# Patient Record
Sex: Female | Born: 1945
Health system: Southern US, Community
[De-identification: ages and names within clinical notes are randomized; demographics above are authoritative.]

## PROBLEM LIST (undated history)

## (undated) DIAGNOSIS — I10 Essential (primary) hypertension: Secondary | ICD-10-CM

## (undated) DIAGNOSIS — J449 Chronic obstructive pulmonary disease, unspecified: Secondary | ICD-10-CM

## (undated) DIAGNOSIS — M1990 Unspecified osteoarthritis, unspecified site: Secondary | ICD-10-CM

## (undated) DIAGNOSIS — E119 Type 2 diabetes mellitus without complications: Secondary | ICD-10-CM

## (undated) DIAGNOSIS — E785 Hyperlipidemia, unspecified: Secondary | ICD-10-CM

## (undated) DIAGNOSIS — Z8619 Personal history of other infectious and parasitic diseases: Secondary | ICD-10-CM

## (undated) DIAGNOSIS — C50919 Malignant neoplasm of unspecified site of unspecified female breast: Secondary | ICD-10-CM

## (undated) DIAGNOSIS — J45909 Unspecified asthma, uncomplicated: Secondary | ICD-10-CM

## (undated) DIAGNOSIS — I700 Atherosclerosis of aorta: Secondary | ICD-10-CM

## (undated) DIAGNOSIS — N200 Calculus of kidney: Secondary | ICD-10-CM

## (undated) DIAGNOSIS — F17200 Nicotine dependence, unspecified, uncomplicated: Secondary | ICD-10-CM

## (undated) DIAGNOSIS — I2510 Atherosclerotic heart disease of native coronary artery without angina pectoris: Secondary | ICD-10-CM

## (undated) DIAGNOSIS — C50912 Malignant neoplasm of unspecified site of left female breast: Secondary | ICD-10-CM

## (undated) HISTORY — DX: Chronic obstructive pulmonary disease, unspecified: J44.9

## (undated) HISTORY — DX: Calculus of kidney: N20.0

## (undated) HISTORY — PX: TUBAL LIGATION: SHX77

## (undated) HISTORY — PX: BREAST SURGERY: SHX581

## (undated) HISTORY — DX: Essential (primary) hypertension: I10

## (undated) HISTORY — DX: Hyperlipidemia, unspecified: E78.5

## (undated) HISTORY — DX: Nicotine dependence, unspecified, uncomplicated: F17.200

## (undated) HISTORY — PX: DILATION AND CURETTAGE OF UTERUS: SHX78

## (undated) HISTORY — DX: Unspecified osteoarthritis, unspecified site: M19.90

## (undated) HISTORY — PX: ABDOMINAL HYSTERECTOMY: SHX81

## (undated) HISTORY — DX: Type 2 diabetes mellitus without complications: E11.9

## (undated) HISTORY — PX: COLECTOMY: SHX59

## (undated) HISTORY — DX: Malignant neoplasm of unspecified site of left female breast: C50.912

## (undated) HISTORY — DX: Malignant neoplasm of unspecified site of unspecified female breast: C50.919

## (undated) HISTORY — PX: OTHER SURGICAL HISTORY: SHX169

---

## 2000-08-21 ENCOUNTER — Encounter: Payer: Self-pay | Admitting: Family Medicine

## 2000-08-21 ENCOUNTER — Ambulatory Visit (HOSPITAL_COMMUNITY): Admission: RE | Admit: 2000-08-21 | Discharge: 2000-08-21 | Payer: Self-pay | Admitting: Family Medicine

## 2000-09-12 ENCOUNTER — Encounter: Payer: Self-pay | Admitting: Family Medicine

## 2000-09-12 ENCOUNTER — Ambulatory Visit (HOSPITAL_COMMUNITY): Admission: RE | Admit: 2000-09-12 | Discharge: 2000-09-12 | Payer: Self-pay | Admitting: Family Medicine

## 2002-01-09 ENCOUNTER — Ambulatory Visit (HOSPITAL_COMMUNITY): Admission: RE | Admit: 2002-01-09 | Discharge: 2002-01-09 | Payer: Self-pay | Admitting: Family Medicine

## 2002-01-09 ENCOUNTER — Encounter: Payer: Self-pay | Admitting: Family Medicine

## 2002-03-01 ENCOUNTER — Encounter: Payer: Self-pay | Admitting: Emergency Medicine

## 2002-03-01 ENCOUNTER — Emergency Department (HOSPITAL_COMMUNITY): Admission: EM | Admit: 2002-03-01 | Discharge: 2002-03-01 | Payer: Self-pay | Admitting: Emergency Medicine

## 2003-10-02 ENCOUNTER — Inpatient Hospital Stay (HOSPITAL_COMMUNITY): Admission: EM | Admit: 2003-10-02 | Discharge: 2003-10-13 | Payer: Self-pay

## 2004-03-28 ENCOUNTER — Emergency Department (HOSPITAL_COMMUNITY): Admission: EM | Admit: 2004-03-28 | Discharge: 2004-03-28 | Payer: Self-pay | Admitting: Emergency Medicine

## 2004-08-08 ENCOUNTER — Ambulatory Visit (HOSPITAL_COMMUNITY): Admission: RE | Admit: 2004-08-08 | Discharge: 2004-08-08 | Payer: Self-pay | Admitting: Family Medicine

## 2004-08-23 ENCOUNTER — Ambulatory Visit (HOSPITAL_COMMUNITY): Admission: RE | Admit: 2004-08-23 | Discharge: 2004-08-23 | Payer: Self-pay | Admitting: Family Medicine

## 2004-09-12 ENCOUNTER — Ambulatory Visit (HOSPITAL_COMMUNITY): Admission: RE | Admit: 2004-09-12 | Discharge: 2004-09-12 | Payer: Self-pay | Admitting: General Surgery

## 2004-09-12 ENCOUNTER — Inpatient Hospital Stay (HOSPITAL_COMMUNITY): Admission: AD | Admit: 2004-09-12 | Discharge: 2004-09-21 | Payer: Self-pay | Admitting: General Surgery

## 2004-09-21 ENCOUNTER — Inpatient Hospital Stay (HOSPITAL_COMMUNITY): Admission: AD | Admit: 2004-09-21 | Discharge: 2004-09-25 | Payer: Self-pay | Admitting: General Surgery

## 2004-10-25 ENCOUNTER — Ambulatory Visit (HOSPITAL_COMMUNITY): Admission: RE | Admit: 2004-10-25 | Discharge: 2004-10-25 | Payer: Self-pay | Admitting: General Surgery

## 2004-10-25 ENCOUNTER — Encounter (INDEPENDENT_AMBULATORY_CARE_PROVIDER_SITE_OTHER): Payer: Self-pay | Admitting: Diagnostic Radiology

## 2004-11-15 ENCOUNTER — Encounter (INDEPENDENT_AMBULATORY_CARE_PROVIDER_SITE_OTHER): Payer: Self-pay | Admitting: General Surgery

## 2004-11-15 ENCOUNTER — Ambulatory Visit (HOSPITAL_COMMUNITY): Admission: RE | Admit: 2004-11-15 | Discharge: 2004-11-15 | Payer: Self-pay | Admitting: General Surgery

## 2004-12-11 ENCOUNTER — Ambulatory Visit (HOSPITAL_COMMUNITY): Payer: Self-pay | Admitting: Oncology

## 2004-12-11 ENCOUNTER — Encounter (HOSPITAL_COMMUNITY): Admission: RE | Admit: 2004-12-11 | Discharge: 2005-01-10 | Payer: Self-pay | Admitting: Oncology

## 2004-12-11 ENCOUNTER — Encounter: Admission: RE | Admit: 2004-12-11 | Discharge: 2004-12-11 | Payer: Self-pay | Admitting: Oncology

## 2004-12-14 ENCOUNTER — Encounter: Payer: Self-pay | Admitting: Family Medicine

## 2004-12-14 ENCOUNTER — Ambulatory Visit (HOSPITAL_COMMUNITY): Admission: RE | Admit: 2004-12-14 | Discharge: 2004-12-14 | Payer: Self-pay | Admitting: Oncology

## 2004-12-15 ENCOUNTER — Ambulatory Visit: Admission: RE | Admit: 2004-12-15 | Discharge: 2005-02-19 | Payer: Self-pay | Admitting: Radiation Oncology

## 2005-01-15 DIAGNOSIS — C50912 Malignant neoplasm of unspecified site of left female breast: Secondary | ICD-10-CM

## 2005-01-15 DIAGNOSIS — C50919 Malignant neoplasm of unspecified site of unspecified female breast: Secondary | ICD-10-CM

## 2005-01-15 HISTORY — DX: Malignant neoplasm of unspecified site of unspecified female breast: C50.919

## 2005-01-15 HISTORY — DX: Malignant neoplasm of unspecified site of left female breast: C50.912

## 2005-03-08 ENCOUNTER — Emergency Department (HOSPITAL_COMMUNITY): Admission: EM | Admit: 2005-03-08 | Discharge: 2005-03-08 | Payer: Self-pay | Admitting: Emergency Medicine

## 2005-03-13 ENCOUNTER — Emergency Department (HOSPITAL_COMMUNITY): Admission: EM | Admit: 2005-03-13 | Discharge: 2005-03-13 | Payer: Self-pay | Admitting: Emergency Medicine

## 2005-05-14 ENCOUNTER — Emergency Department (HOSPITAL_COMMUNITY): Admission: EM | Admit: 2005-05-14 | Discharge: 2005-05-14 | Payer: Self-pay | Admitting: Emergency Medicine

## 2005-05-27 ENCOUNTER — Emergency Department (HOSPITAL_COMMUNITY): Admission: EM | Admit: 2005-05-27 | Discharge: 2005-05-27 | Payer: Self-pay | Admitting: Emergency Medicine

## 2005-06-21 ENCOUNTER — Encounter (HOSPITAL_COMMUNITY): Admission: RE | Admit: 2005-06-21 | Discharge: 2005-07-21 | Payer: Self-pay | Admitting: Oncology

## 2005-06-21 ENCOUNTER — Encounter: Admission: RE | Admit: 2005-06-21 | Discharge: 2005-06-21 | Payer: Self-pay | Admitting: Oncology

## 2005-06-21 ENCOUNTER — Ambulatory Visit (HOSPITAL_COMMUNITY): Payer: Self-pay | Admitting: Oncology

## 2005-10-03 ENCOUNTER — Encounter (HOSPITAL_COMMUNITY): Admission: RE | Admit: 2005-10-03 | Discharge: 2005-10-12 | Payer: Self-pay | Admitting: Oncology

## 2005-10-03 ENCOUNTER — Encounter: Admission: RE | Admit: 2005-10-03 | Discharge: 2005-10-12 | Payer: Self-pay | Admitting: Oncology

## 2005-12-04 ENCOUNTER — Ambulatory Visit (HOSPITAL_COMMUNITY): Admission: RE | Admit: 2005-12-04 | Discharge: 2005-12-04 | Payer: Self-pay | Admitting: Internal Medicine

## 2005-12-24 ENCOUNTER — Encounter (HOSPITAL_COMMUNITY): Admission: RE | Admit: 2005-12-24 | Discharge: 2006-01-14 | Payer: Self-pay | Admitting: Oncology

## 2005-12-24 ENCOUNTER — Ambulatory Visit (HOSPITAL_COMMUNITY): Payer: Self-pay | Admitting: Oncology

## 2006-01-03 ENCOUNTER — Ambulatory Visit (HOSPITAL_COMMUNITY): Admission: RE | Admit: 2006-01-03 | Discharge: 2006-01-03 | Payer: Self-pay | Admitting: General Surgery

## 2006-04-12 ENCOUNTER — Emergency Department (HOSPITAL_COMMUNITY): Admission: EM | Admit: 2006-04-12 | Discharge: 2006-04-12 | Payer: Self-pay | Admitting: Emergency Medicine

## 2006-06-24 ENCOUNTER — Ambulatory Visit (HOSPITAL_COMMUNITY): Payer: Self-pay | Admitting: Oncology

## 2006-06-24 ENCOUNTER — Encounter (HOSPITAL_COMMUNITY): Admission: RE | Admit: 2006-06-24 | Discharge: 2006-07-24 | Payer: Self-pay | Admitting: Oncology

## 2006-10-09 ENCOUNTER — Ambulatory Visit (HOSPITAL_COMMUNITY): Admission: RE | Admit: 2006-10-09 | Discharge: 2006-10-09 | Payer: Self-pay | Admitting: Internal Medicine

## 2006-11-27 ENCOUNTER — Emergency Department (HOSPITAL_COMMUNITY): Admission: EM | Admit: 2006-11-27 | Discharge: 2006-11-27 | Payer: Self-pay | Admitting: Emergency Medicine

## 2007-01-28 ENCOUNTER — Ambulatory Visit (HOSPITAL_COMMUNITY): Payer: Self-pay | Admitting: Oncology

## 2007-01-28 ENCOUNTER — Encounter (HOSPITAL_COMMUNITY): Admission: RE | Admit: 2007-01-28 | Discharge: 2007-02-27 | Payer: Self-pay | Admitting: Oncology

## 2007-03-01 ENCOUNTER — Emergency Department (HOSPITAL_COMMUNITY): Admission: EM | Admit: 2007-03-01 | Discharge: 2007-03-01 | Payer: Self-pay | Admitting: Emergency Medicine

## 2007-05-14 ENCOUNTER — Emergency Department (HOSPITAL_COMMUNITY): Admission: EM | Admit: 2007-05-14 | Discharge: 2007-05-14 | Payer: Self-pay | Admitting: Emergency Medicine

## 2007-07-01 ENCOUNTER — Ambulatory Visit (HOSPITAL_COMMUNITY): Admission: RE | Admit: 2007-07-01 | Discharge: 2007-07-01 | Payer: Self-pay | Admitting: Family Medicine

## 2007-07-16 ENCOUNTER — Ambulatory Visit (HOSPITAL_COMMUNITY): Admission: RE | Admit: 2007-07-16 | Discharge: 2007-07-16 | Payer: Self-pay | Admitting: Orthopaedic Surgery

## 2007-07-29 ENCOUNTER — Encounter (HOSPITAL_COMMUNITY): Admission: RE | Admit: 2007-07-29 | Discharge: 2007-08-28 | Payer: Self-pay | Admitting: Oncology

## 2007-07-29 ENCOUNTER — Ambulatory Visit (HOSPITAL_COMMUNITY): Payer: Self-pay | Admitting: Oncology

## 2007-11-03 ENCOUNTER — Ambulatory Visit (HOSPITAL_COMMUNITY): Admission: RE | Admit: 2007-11-03 | Discharge: 2007-11-03 | Payer: Self-pay | Admitting: Internal Medicine

## 2007-12-21 ENCOUNTER — Emergency Department (HOSPITAL_COMMUNITY): Admission: EM | Admit: 2007-12-21 | Discharge: 2007-12-21 | Payer: Self-pay | Admitting: Emergency Medicine

## 2007-12-21 ENCOUNTER — Encounter: Payer: Self-pay | Admitting: Family Medicine

## 2008-01-13 ENCOUNTER — Ambulatory Visit (HOSPITAL_COMMUNITY): Payer: Self-pay | Admitting: Oncology

## 2008-01-13 ENCOUNTER — Encounter (HOSPITAL_COMMUNITY): Admission: RE | Admit: 2008-01-13 | Discharge: 2008-02-12 | Payer: Self-pay | Admitting: Oncology

## 2008-02-02 ENCOUNTER — Emergency Department (HOSPITAL_COMMUNITY): Admission: EM | Admit: 2008-02-02 | Discharge: 2008-02-02 | Payer: Self-pay | Admitting: Emergency Medicine

## 2008-04-03 ENCOUNTER — Emergency Department (HOSPITAL_COMMUNITY): Admission: EM | Admit: 2008-04-03 | Discharge: 2008-04-03 | Payer: Self-pay | Admitting: Emergency Medicine

## 2008-07-03 ENCOUNTER — Emergency Department (HOSPITAL_COMMUNITY): Admission: EM | Admit: 2008-07-03 | Discharge: 2008-07-03 | Payer: Self-pay | Admitting: Emergency Medicine

## 2008-08-11 ENCOUNTER — Ambulatory Visit (HOSPITAL_COMMUNITY): Payer: Self-pay | Admitting: Oncology

## 2008-08-11 ENCOUNTER — Encounter: Payer: Self-pay | Admitting: Family Medicine

## 2008-08-11 ENCOUNTER — Encounter (HOSPITAL_COMMUNITY): Admission: RE | Admit: 2008-08-11 | Discharge: 2008-09-10 | Payer: Self-pay | Admitting: Oncology

## 2008-08-25 ENCOUNTER — Encounter: Payer: Self-pay | Admitting: Family Medicine

## 2008-08-25 ENCOUNTER — Emergency Department (HOSPITAL_COMMUNITY): Admission: EM | Admit: 2008-08-25 | Discharge: 2008-08-25 | Payer: Self-pay | Admitting: Emergency Medicine

## 2008-09-29 ENCOUNTER — Ambulatory Visit: Payer: Self-pay | Admitting: Family Medicine

## 2008-09-29 DIAGNOSIS — F4323 Adjustment disorder with mixed anxiety and depressed mood: Secondary | ICD-10-CM | POA: Insufficient documentation

## 2008-09-29 DIAGNOSIS — M159 Polyosteoarthritis, unspecified: Secondary | ICD-10-CM

## 2008-09-29 DIAGNOSIS — I10 Essential (primary) hypertension: Secondary | ICD-10-CM | POA: Insufficient documentation

## 2008-10-04 ENCOUNTER — Encounter: Payer: Self-pay | Admitting: Family Medicine

## 2008-10-06 ENCOUNTER — Telehealth: Payer: Self-pay | Admitting: Family Medicine

## 2008-10-10 DIAGNOSIS — F17200 Nicotine dependence, unspecified, uncomplicated: Secondary | ICD-10-CM | POA: Insufficient documentation

## 2008-10-26 ENCOUNTER — Ambulatory Visit: Payer: Self-pay | Admitting: Family Medicine

## 2008-10-26 DIAGNOSIS — R5381 Other malaise: Secondary | ICD-10-CM

## 2008-10-26 DIAGNOSIS — J320 Chronic maxillary sinusitis: Secondary | ICD-10-CM

## 2008-10-26 DIAGNOSIS — J440 Chronic obstructive pulmonary disease with acute lower respiratory infection: Secondary | ICD-10-CM

## 2008-10-26 DIAGNOSIS — R5383 Other fatigue: Secondary | ICD-10-CM

## 2008-10-26 LAB — CONVERTED CEMR LAB
Blood in Urine, dipstick: NEGATIVE
Glucose, Urine, Semiquant: NEGATIVE
Ketones, urine, test strip: NEGATIVE
Nitrite: NEGATIVE
Urobilinogen, UA: 0.2

## 2008-10-27 ENCOUNTER — Telehealth: Payer: Self-pay | Admitting: Family Medicine

## 2008-10-28 ENCOUNTER — Encounter (HOSPITAL_COMMUNITY): Admission: RE | Admit: 2008-10-28 | Discharge: 2008-11-27 | Payer: Self-pay | Admitting: Orthopaedic Surgery

## 2008-11-10 ENCOUNTER — Encounter: Payer: Self-pay | Admitting: Family Medicine

## 2008-11-10 ENCOUNTER — Ambulatory Visit (HOSPITAL_COMMUNITY): Admission: RE | Admit: 2008-11-10 | Discharge: 2008-11-10 | Payer: Self-pay | Admitting: Family Medicine

## 2008-11-11 ENCOUNTER — Ambulatory Visit (HOSPITAL_COMMUNITY): Admission: RE | Admit: 2008-11-11 | Discharge: 2008-11-11 | Payer: Self-pay | Admitting: Family Medicine

## 2008-11-15 LAB — CONVERTED CEMR LAB
Bilirubin, Direct: 0.1 mg/dL (ref 0.0–0.3)
CO2: 26 meq/L (ref 19–32)
Calcium: 10.5 mg/dL (ref 8.4–10.5)
Cholesterol: 246 mg/dL — ABNORMAL HIGH (ref 0–200)
Glucose, Bld: 109 mg/dL — ABNORMAL HIGH (ref 70–99)
HCT: 42 % (ref 36.0–46.0)
HDL: 48 mg/dL (ref >39)
Indirect Bilirubin: 0.3 mg/dL (ref 0.0–0.9)
LDL Cholesterol: 148 mg/dL — ABNORMAL HIGH (ref 0–99)
RBC: 4.74 M/uL (ref 3.87–5.11)
RDW: 15 % (ref 11.5–15.5)
TSH: 1.022 microintl units/mL (ref 0.350–4.500)
Total Protein: 7 g/dL (ref 6.0–8.3)
WBC: 11.2 10*3/uL — ABNORMAL HIGH (ref 4.0–10.5)

## 2008-11-18 ENCOUNTER — Ambulatory Visit: Payer: Self-pay | Admitting: Family Medicine

## 2008-11-18 DIAGNOSIS — K3189 Other diseases of stomach and duodenum: Secondary | ICD-10-CM | POA: Insufficient documentation

## 2008-11-18 DIAGNOSIS — R1013 Epigastric pain: Secondary | ICD-10-CM

## 2008-12-13 ENCOUNTER — Telehealth: Payer: Self-pay | Admitting: Family Medicine

## 2008-12-31 ENCOUNTER — Telehealth: Payer: Self-pay | Admitting: Family Medicine

## 2009-01-04 ENCOUNTER — Telehealth: Payer: Self-pay | Admitting: Family Medicine

## 2009-02-11 ENCOUNTER — Encounter: Payer: Self-pay | Admitting: Family Medicine

## 2009-02-11 ENCOUNTER — Ambulatory Visit (HOSPITAL_COMMUNITY): Payer: Self-pay | Admitting: Oncology

## 2009-02-24 ENCOUNTER — Encounter: Payer: Self-pay | Admitting: Family Medicine

## 2009-02-24 LAB — CONVERTED CEMR LAB
ALT: 13 units/L (ref 0–35)
Alkaline Phosphatase: 86 units/L (ref 39–117)
BUN: 25 mg/dL — ABNORMAL HIGH (ref 6–23)
Bilirubin, Direct: 0.1 mg/dL (ref 0.0–0.3)
Calcium: 10 mg/dL (ref 8.4–10.5)
Cholesterol: 185 mg/dL (ref 0–200)
Creatinine, Ser: 0.77 mg/dL (ref 0.40–1.20)
HDL: 50 mg/dL (ref >39)
LDL Cholesterol: 88 mg/dL (ref 0–99)
Potassium: 4.2 meq/L (ref 3.5–5.3)
Total Bilirubin: 0.3 mg/dL (ref 0.3–1.2)
Total Protein: 7.1 g/dL (ref 6.0–8.3)
VLDL: 47 mg/dL — ABNORMAL HIGH (ref 0–40)

## 2009-03-02 ENCOUNTER — Ambulatory Visit: Payer: Self-pay | Admitting: Family Medicine

## 2009-03-02 ENCOUNTER — Telehealth: Payer: Self-pay | Admitting: Family Medicine

## 2009-03-02 ENCOUNTER — Other Ambulatory Visit: Admission: RE | Admit: 2009-03-02 | Discharge: 2009-03-02 | Payer: Self-pay | Admitting: Family Medicine

## 2009-03-02 LAB — CONVERTED CEMR LAB: Pap Smear: NEGATIVE

## 2009-03-08 ENCOUNTER — Telehealth: Payer: Self-pay | Admitting: Family Medicine

## 2009-03-09 DIAGNOSIS — K5909 Other constipation: Secondary | ICD-10-CM

## 2009-03-09 DIAGNOSIS — E785 Hyperlipidemia, unspecified: Secondary | ICD-10-CM

## 2009-03-24 ENCOUNTER — Telehealth: Payer: Self-pay | Admitting: Family Medicine

## 2009-04-26 ENCOUNTER — Telehealth: Payer: Self-pay | Admitting: Family Medicine

## 2009-05-02 ENCOUNTER — Ambulatory Visit: Payer: Self-pay | Admitting: Family Medicine

## 2009-05-02 DIAGNOSIS — R51 Headache: Secondary | ICD-10-CM

## 2009-05-02 DIAGNOSIS — R519 Headache, unspecified: Secondary | ICD-10-CM | POA: Insufficient documentation

## 2009-05-03 LAB — CONVERTED CEMR LAB
Basophils Absolute: 0.1 10*3/uL (ref 0.0–0.1)
Eosinophils Absolute: 0.4 10*3/uL (ref 0.0–0.7)
Eosinophils Relative: 3 % (ref 0–5)
Hemoglobin: 14.1 g/dL (ref 12.0–15.0)
Lymphocytes Relative: 32 % (ref 12–46)
MCV: 88.8 fL (ref 78.0–100.0)
RBC: 4.98 M/uL (ref 3.87–5.11)
WBC: 10.6 10*3/uL — ABNORMAL HIGH (ref 4.0–10.5)

## 2009-05-09 ENCOUNTER — Ambulatory Visit (HOSPITAL_COMMUNITY): Admission: RE | Admit: 2009-05-09 | Discharge: 2009-05-09 | Payer: Self-pay | Admitting: Family Medicine

## 2009-06-20 ENCOUNTER — Encounter: Payer: Self-pay | Admitting: Family Medicine

## 2009-06-20 ENCOUNTER — Telehealth: Payer: Self-pay | Admitting: Family Medicine

## 2009-06-28 ENCOUNTER — Ambulatory Visit: Payer: Self-pay | Admitting: Family Medicine

## 2009-06-29 ENCOUNTER — Telehealth: Payer: Self-pay | Admitting: Physician Assistant

## 2009-07-21 ENCOUNTER — Ambulatory Visit: Payer: Self-pay | Admitting: Family Medicine

## 2009-07-21 DIAGNOSIS — R7301 Impaired fasting glucose: Secondary | ICD-10-CM

## 2009-07-21 DIAGNOSIS — N3000 Acute cystitis without hematuria: Secondary | ICD-10-CM | POA: Insufficient documentation

## 2009-07-21 LAB — CONVERTED CEMR LAB
Blood in Urine, dipstick: NEGATIVE
Specific Gravity, Urine: 1.015
Urobilinogen, UA: 0.2
pH: 5

## 2009-07-22 ENCOUNTER — Encounter: Payer: Self-pay | Admitting: Family Medicine

## 2009-07-22 LAB — CONVERTED CEMR LAB
Albumin: 4.6 g/dL (ref 3.5–5.2)
Bilirubin, Direct: 0.1 mg/dL (ref 0.0–0.3)
Cholesterol: 165 mg/dL (ref 0–200)
HDL: 55 mg/dL (ref >39)
Hgb A1c MFr Bld: 6.1 % — ABNORMAL HIGH (ref <5.7)
Total Bilirubin: 0.3 mg/dL (ref 0.3–1.2)
Total CHOL/HDL Ratio: 3
Total Protein: 7.7 g/dL (ref 6.0–8.3)
VLDL: 25 mg/dL (ref 0–40)

## 2009-07-24 DIAGNOSIS — J449 Chronic obstructive pulmonary disease, unspecified: Secondary | ICD-10-CM

## 2009-07-24 DIAGNOSIS — J4489 Other specified chronic obstructive pulmonary disease: Secondary | ICD-10-CM | POA: Insufficient documentation

## 2009-08-27 ENCOUNTER — Emergency Department (HOSPITAL_COMMUNITY): Admission: EM | Admit: 2009-08-27 | Discharge: 2009-08-27 | Payer: Self-pay | Admitting: Emergency Medicine

## 2009-09-06 ENCOUNTER — Ambulatory Visit (HOSPITAL_COMMUNITY): Payer: Self-pay | Admitting: Oncology

## 2009-09-06 ENCOUNTER — Encounter (HOSPITAL_COMMUNITY): Admission: RE | Admit: 2009-09-06 | Discharge: 2009-10-06 | Payer: Self-pay | Admitting: Oncology

## 2009-09-06 ENCOUNTER — Encounter: Payer: Self-pay | Admitting: Family Medicine

## 2009-10-18 ENCOUNTER — Ambulatory Visit: Payer: Self-pay | Admitting: Family Medicine

## 2009-11-08 ENCOUNTER — Telehealth: Payer: Self-pay | Admitting: Family Medicine

## 2009-11-30 ENCOUNTER — Ambulatory Visit: Payer: Self-pay | Admitting: Family Medicine

## 2009-12-02 ENCOUNTER — Encounter: Payer: Self-pay | Admitting: Family Medicine

## 2009-12-14 ENCOUNTER — Ambulatory Visit (HOSPITAL_COMMUNITY)
Admission: RE | Admit: 2009-12-14 | Discharge: 2009-12-14 | Payer: Self-pay | Source: Home / Self Care | Admitting: Family Medicine

## 2009-12-19 ENCOUNTER — Telehealth (INDEPENDENT_AMBULATORY_CARE_PROVIDER_SITE_OTHER): Payer: Self-pay | Admitting: *Deleted

## 2010-01-23 ENCOUNTER — Telehealth: Payer: Self-pay | Admitting: Family Medicine

## 2010-01-25 ENCOUNTER — Emergency Department (HOSPITAL_COMMUNITY)
Admission: EM | Admit: 2010-01-25 | Discharge: 2010-01-25 | Payer: Self-pay | Source: Home / Self Care | Admitting: Emergency Medicine

## 2010-02-04 ENCOUNTER — Encounter: Payer: Self-pay | Admitting: General Surgery

## 2010-02-05 ENCOUNTER — Encounter: Payer: Self-pay | Admitting: Family Medicine

## 2010-02-08 ENCOUNTER — Telehealth: Payer: Self-pay | Admitting: Family Medicine

## 2010-02-13 ENCOUNTER — Ambulatory Visit
Admission: RE | Admit: 2010-02-13 | Discharge: 2010-02-13 | Payer: Self-pay | Source: Home / Self Care | Attending: Family Medicine | Admitting: Family Medicine

## 2010-02-14 NOTE — Progress Notes (Signed)
  Phone Note From Pharmacy   Caller: [facility]* Summary of Call: amitiza not covered by insurance lactulose and miralax ok Initial call taken by: Adella Hare LPN,  March 08, 2009 3:26 PM  Follow-up for Phone Call        advise and i will erx miralax daily instead Follow-up by: [name] MD,  March 09, 2009 5:52 AM  Additional Follow-up for Phone Call Additional follow up Details #1::        patient aware Additional Follow-up by: Adella Hare LPN,  March 09, 2009 9:29 AM

## 2010-02-14 NOTE — Assessment & Plan Note (Signed)
Summary: phy   Vital Signs:  Patient profile:   65 year old female Menstrual status:  hysterectomy Height:      64.5 inches Weight:      174.75 pounds BMI:     29.64 O2 Sat:      92 % Pulse rate:   102 / minute Pulse rhythm:   regular Resp:     16 per minute BP sitting:   140 / 84 Cuff size:   large  Vitals Entered By: Everitt Amber LPN (March 02, 2009 2:11 PM)  Nutrition Counseling: Patient's BMI is greater than 25 and therefore counseled on weight management options. CC: CPE  Is Patient Diabetic? No Pain Assessment Patient in pain? no        CC:  CPE .  History of Present Illness: Reports  that she has been doing well. Denies recent fever or chills.   Denies chest pain, palpitations, PND, orthopnea or leg swelling. Denies abdominal pain, nausea, vomitting, diarrhea , but has uncontrolled constipation, which is not responding to lactulose Denies change in bowel movements or bloody stool. Denies dysuria , frequency, incontinence or hesitancy. Denies  joint pain, swelling, or reduced mobility. Denies headaches, vertigo, seizures. Denies depression, anxiety or insomnia. Denies  rash, lesions, or itch. the pt still smokes and has no regular exercise routine     Preventive Screening-Counseling & Management  Alcohol-Tobacco     Smoking Cessation Counseling: yes  Current Medications (verified): 1)  Naproxen 500 Mg Tabs (Naproxen) .... Take 1 Tablet By Mouth Two Times A Day 2)  Hydrochlorothiazide 25 Mg Tabs (Hydrochlorothiazide) .... Take 1 Tablet By Mouth Once A Day 3)  Klor-Con M20 20 Meq Cr-Tabs (Potassium Chloride Crys Cr) .... Take 1 Tablet By Mouth Once A Day 4)  Femara 2.5 Mg Tabs (Letrozole) .... Take 1 Tablet By Mouth Once A Day 5)  Hydrocodone-Acetaminophen 7.5-325 Mg Tabs (Hydrocodone-Acetaminophen) .... Take One Tab Q 4 Hrs As Needed 6)  Flexeril 10 Mg Tabs (Cyclobenzaprine Hcl) .... Take 1 Tablet By Mouth Three Times A Day 7)  Loratadine 10 Mg Tabs  (Loratadine) .... Take 1 Tablet By Mouth Once A Day As Needed) 8)  Calcium + D 600-200 Mg-Unit Tabs (Calcium Carbonate-Vitamin D) .... 2 Tabs Once Daily 9)  Combivent 103-18 Mcg/act Aero (Ipratropium-Albuterol) .... 2 Puffs Twice Daily As Needed 10)  Simvastatin 20 Mg Tabs (Simvastatin) .... One Tab By Mouth Qhs 11)  Swimmers Ear Drops 95 % Liqd (Isopropyl Alcohol) .Marland Kitchen.. 1 Drop To Affected Ear Three Times Dailty As Needed 12)  Clotrimazole 1 % Crea (Clotrimazole) .... Apply Three Times Daily To Affected Area For 10 Days Then As Needed 13)  Lactulose 10 Gm/76ml Soln (Lactulose) .Marland Kitchen.. 1-2 Tsp Every 6-8 Hrs  Allergies (verified): 1)  ! Pcn 2)  ! Sulfa  Past History:  Past medical, surgical, family and social histories (including risk factors) reviewed for relevance to current acute and chronic problems.  Past Medical History: Reviewed history from 09/29/2008 and no changes required. Hypertension x early adulthood Early left breast cancer, removed in 2006 Osteoarthritis COPD h/o kidney stones nicotine dependence  Past Surgical History: Reviewed history from 09/29/2008 and no changes required. Left Breast surgery x 06 Hysterectomy x 1981 Tubal ligation x 1979 Colectomy 2005, secondary to acute diverticulitis with pelvic abcess  Family History: Reviewed history from 09/29/2008 and no changes required. Mother-Deceased in 1974-Breast cancer Father-Deceased-emphysema Sister-1 living-healthy Brother-1 living -unknown  Social History: Reviewed history from 09/29/2008 and no changes required. Retired/Disabled  Married x but she hasn't seen him in 6 years Current Smoker, half PPd smoking x 40 yrs Alcohol use-no Drug use-no Regular exercise-Physically active Five children, 3 living now, youngest daughter is suicidal after her brother died in an MVA  Review of Systems      See HPI General:  Complains of chills and fever. Eyes:  Denies blurring, discharge, eye irritation, and red  eye. ENT:  Complains of postnasal drainage and sinus pressure; 2 week h/o. CV:  Denies chest pain or discomfort, palpitations, and swelling of feet. Resp:  Complains of cough, shortness of breath, sputum productive, and wheezing; thick yellow sputum  2w eeks ago. GI:  Complains of constipation; denies diarrhea, indigestion, nausea, and vomiting; not responding to lactulose. GU:  Denies discharge, dysuria, and urinary frequency. MS:  Complains of joint pain and stiffness; rigjt knee and hip and right shoulder pain treatedby r [place], on pain meds. Derm:  Denies itching, lesion(s), and rash. Neuro:  Denies falling down, headaches, seizures, and sensation of room spinning. Psych:  Complains of anxiety and depression; denies panic attacks, sense of great danger, suicidal thoughts/plans, and thoughts of violence. Endo:  Denies cold intolerance, excessive hunger, excessive thirst, excessive urination, heat intolerance, polyuria, and weight change. Heme:  Denies abnormal bruising and bleeding. Allergy:  Complains of seasonal allergies.  Physical Exam  General:  Well-developed,well-nourished,in no acute distress; alert,appropriate and cooperative throughout examination Head:  Normocephalic and atraumatic without obvious abnormalities. No apparent alopecia or balding. Eyes:  vision grossly intact, pupils equal, pupils round, and pupils reactive to light.   Ears:  External ear exam shows no significant lesions or deformities.  Otoscopic examination reveals clear canals, tympanic membranes are intact bilaterally without bulging, retraction, inflammation or discharge. Hearing is grossly normal bilaterally. Nose:  External nasal examination shows no deformity or inflammation. Nasal mucosa are pink and moist without lesions or exudates. Mouth:  pharynx pink and moist and fair dentition.   Neck:  No deformities, masses, or tenderness noted. Chest Wall:  No deformities, masses, or tenderness noted. Breasts:   No mass, nodules, thickening, tenderness, bulging, retraction, inflamation, nipple discharge or skin changes noted.   Lungs:  decreased air entry, bilateral crackles , few wheezes Heart:  Normal rate and regular rhythm. S1 and S2 normal without gallop, murmur, click, rub or other extra sounds. Abdomen:  Bowel sounds positive,abdomen soft and non-tender without masses, organomegaly or hernias noted. Rectal:  No external abnormalities noted. Normal sphincter tone. No rectal masses or tenderness.guaic neg stool Genitalia:  normal introitus, no external lesions, and no adnexal masses or tenderness. Uterus absent  Msk:  No deformity or scoliosis noted of thoracic or lumbar spine.   Pulses:  R and L carotid,radial,femoral,dorsalis pedis and posterior tibial pulses are full and equal bilaterally Extremities:  Decreased ROM spine, adequate in shoulders, hips and knees Neurologic:  No cranial nerve deficits noted. Station and gait are normal. Plantar reflexes are down-going bilaterally. DTRs are symmetrical throughout. Sensory, motor and coordinative functions appear intact. Skin:  Intact without suspicious lesions or rashes Cervical Nodes:  No lymphadenopathy noted Axillary Nodes:  No palpable lymphadenopathy Inguinal Nodes:  No significant adenopathy Psych:  Cognition and judgment appear intact. Alert and cooperative with normal attention span and concentration. No apparent delusions, illusions, hallucinations   Impression & Recommendations:  Problem # 1:  OTHER CHRONIC BRONCHITIS (ICD-491.8) Assessment Comment Only erythromycin x 10 days for acute flare, with tessalon perles  Problem # 2:  NICOTINE ADDICTION (ICD-305.1) Assessment: Unchanged  Encouraged smoking cessation and discussed different methods for smoking cessation.   Problem # 3:  HYPERTENSION (ICD-401.9) Assessment: Deteriorated  Her updated medication list for this problem includes:    Hydrochlorothiazide 25 Mg Tabs  (Hydrochlorothiazide) .Marland Kitchen... Take 1 tablet by mouth once a day  BP today: 140/84 Prior BP: 131/76 (11/18/2008)  Labs Reviewed: K+: 4.2 (02/24/2009) Creat: : 0.77 (02/24/2009)   Chol: 185 (02/24/2009)   HDL: 50 (02/24/2009)   LDL: 88 (02/24/2009)   TG: 237 (02/24/2009)  Problem # 4:  CONSTIPATION, CHRONIC (ICD-564.09) Assessment: Deteriorated  The following medications were removed from the medication list:    Lactulose 10 Gm/57ml Soln (Lactulose) .Marland Kitchen... 1-2 tsp every 6-8 hrs Her updated medication list for this problem includes:    Miralax Powd (Polyethylene glycol 3350) .Marland KitchenMarland KitchenMarland KitchenMarland Kitchen 17 gm in 8 ounces of water daily  Problem # 5:  HYPERLIPIDEMIA (ICD-272.4) Assessment: Comment Only  Her updated medication list for this problem includes:    Simvastatin 20 Mg Tabs (Simvastatin) ..... One tab by mouth qhs  Labs Reviewed: SGOT: 14 (02/24/2009)   SGPT: 13 (02/24/2009)   HDL:50 (02/24/2009), 48 (11/10/2008)  LDL:88 (02/24/2009), 148 (84/69/6295)  Chol:185 (02/24/2009), 246 (11/10/2008)  Trig:237 (02/24/2009), 248 (11/10/2008)  Complete Medication List: 1)  Naproxen 500 Mg Tabs (Naproxen) .... Take 1 tablet by mouth two times a day 2)  Hydrochlorothiazide 25 Mg Tabs (Hydrochlorothiazide) .... Take 1 tablet by mouth once a day 3)  Klor-con M20 20 Meq Cr-tabs (Potassium chloride crys cr) .... Take 1 tablet by mouth once a day 4)  Femara 2.5 Mg Tabs (Letrozole) .... Take 1 tablet by mouth once a day 5)  Flexeril 10 Mg Tabs (Cyclobenzaprine hcl) .... Take 1 tablet by mouth three times a day 6)  Loratadine 10 Mg Tabs (Loratadine) .... Take 1 tablet by mouth once a day as needed) 7)  Calcium + D 600-200 Mg-unit Tabs (Calcium carbonate-vitamin d) .... 2 tabs once daily 8)  Combivent 103-18 Mcg/act Aero (Ipratropium-albuterol) .... 2 puffs twice daily as needed 9)  Simvastatin 20 Mg Tabs (Simvastatin) .... One tab by mouth qhs 10)  Swimmers Ear Drops 95 % Liqd (Isopropyl alcohol) .Marland Kitchen.. 1 drop to affected  ear three times dailty as needed 11)  Clotrimazole 1 % Crea (Clotrimazole) .... Apply three times daily to affected area for 10 days then as needed 12)  Erythromycin Base 500 Mg Tabs (Erythromycin base) .... Take 1 tablet by mouth three times a day 13)  Tussionex Pennkinetic Er 8-10 Mg/81ml Lqcr (Chlorpheniramine-hydrocodone) .... One teaspoon twice daily as needed 14)  Miralax Powd (Polyethylene glycol 3350) .Marland KitchenMarland Kitchen. 17 gm in 8 ounces of water daily  Other Orders: T-Basic Metabolic Panel [phone]) T-Hepatic Function [phone]) T-Lipid Profile [phone]) T-Vitamin D (25-Hydroxy) [phone]) Hemoccult Guaiac-1 spec.(in office) (82270) Pap Smear (51884)  Patient Instructions: 1)  Please schedule a follow-up appointment in 4.5 months. 2)  Tobacco is very bad for your health and your loved ones! You Should stop smoking!. 3)  Stop Smoking Tips: Choose a Quit date. Cut down before the Quit date. decide what you will do as a substitute when you feel the urge to smoke(gum,toothpick,exercise). 4)  It is important that you exercise regularly at least 20 minutes 5 times a week. If you develop chest pain, have severe difficulty breathing, or feel very tired , stop exercising immediately and seek medical attention. 5)  You need to lose weight. Consider a lower calorie diet and regular exercise.  6)  you are being treated  for chronic bronchitis and a new med is started forconstipation, pls stop the lactulose, call back if it does not work pls Prescriptions: MIRALAX  POWD (POLYETHYLENE GLYCOL 3350) 17 gm in 8 ounces of water daily  #510 gm x 5   Entered and Authorized by:   [name] MD   Signed by:   [name] MD on 03/09/2009   Method used:   Electronically to        [facility]* (retail)       [address] [place], [place]  [address]       Ph: [phone]       Fax: [phone]   RxID:   [id number] AMITIZA 8 MCG CAPS  (LUBIPROSTONE) Take 1 capsule by mouth once a day  #30 x 4   Entered and Authorized by:   [name] MD   Signed by:   [name] MD on 03/02/2009   Method used:   Printed then faxed to ...       [facility]* (retail)       [address]       [place], [place]  [address]       Ph: [phone]       Fax: [phone]   RxID:   [id number] Sandria Senter ER 8-10 MG/5ML LQCR (CHLORPHENIRAMINE-HYDROCODONE) one teaspoon twice daily as needed  #300cc x 0   Entered and Authorized by:   [name] MD   Signed by:   [name] MD on 03/02/2009   Method used:   Printed then faxed to ...       [facility]* (retail)       [address]       [place], [place]  [address]       Ph: [phone]       Fax: [phone]   RxID:   [id number] ERYTHROMYCIN BASE 500 MG TABS (ERYTHROMYCIN BASE) Take 1 tablet by mouth three times a day  #42 x 0   Entered and Authorized by:   [name] MD   Signed by:   [name] MD on 03/02/2009   Method used:   Electronically to        [facility]* (retail)       [address]       [place], [place]  [address]       Ph: [phone]       Fax: [phone]   RxID:   [id number]     Orders Added: 1)  Est. Patient 40-64 years [99396] 2)  T-Basic Metabolic Panel [phone] 3)  T-Hepatic Function [phone] 4)  T-Lipid Profile [80061-22930] 5)  T-Vitamin D (25-Hydroxy) [08676-19509] 6)  Hemoccult Guaiac-1 spec.(in office) [82270] 7)  Pap Smear [88150]   Laboratory Results    Stool - Occult Blood Hemmoccult #1: negative Date: 03/02/2009 Comments: [id number] R 8/11 118 10/12

## 2010-02-14 NOTE — Assessment & Plan Note (Signed)
Summary: office visit   Vital Signs:  Patient profile:   65 year old female Menstrual status:  hysterectomy Height:      64.5 inches Weight:      172 pounds BMI:     29.17 O2 Sat:      97 % on Room air Pulse rate:   106 / minute Pulse rhythm:   regular Resp:     16 per minute BP sitting:   130 / 80  (left arm)  Vitals Entered By: Adella Hare LPN (May 02, 2009 2:09 PM)  Nutrition Counseling: Patient's BMI is greater than 25 and therefore counseled on weight management options.  O2 Flow:  Room air CC: head congestion, chills, body aches, cough Is Patient Diabetic? No Pain Assessment Patient in pain? no        CC:  head congestion, chills, body aches, and cough.  History of Present Illness: 2 week h/o head and chest congestion, intermittent fever and chills, just completed 3 weeks of antibiotics approx 2 weeks ago. She reports sputum is sometimes black.  Statesshe has severe right shoulder and neck pain radiating to f ingers. Larey Seat about 2 weeks has hip pain and has seen Dr. Hilda Lias since that time. She continues to experience chronic anxiety for which she takes meds.      Preventive Screening-Counseling & Management  Alcohol-Tobacco     Smoking Cessation Counseling: yes  Current Medications (verified): 1)  Naproxen 500 Mg Tabs (Naproxen) .... Take 1 Tablet By Mouth Two Times A Day 2)  Hydrochlorothiazide 25 Mg Tabs (Hydrochlorothiazide) .... Take 1 Tablet By Mouth Once A Day 3)  Klor-Con M20 20 Meq Cr-Tabs (Potassium Chloride Crys Cr) .... Take 1 Tablet By Mouth Once A Day 4)  Femara 2.5 Mg Tabs (Letrozole) .... Take 1 Tablet By Mouth Once A Day 5)  Flexeril 10 Mg Tabs (Cyclobenzaprine Hcl) .... Take 1 Tablet By Mouth Three Times A Day 6)  Loratadine 10 Mg Tabs (Loratadine) .... Take 1 Tablet By Mouth Once A Day As Needed) 7)  Calcium + D 600-200 Mg-Unit Tabs (Calcium Carbonate-Vitamin D) .... 2 Tabs Once Daily 8)  Combivent 103-18 Mcg/act Aero  (Ipratropium-Albuterol) .... 2 Puffs Twice Daily As Needed 9)  Simvastatin 20 Mg Tabs (Simvastatin) .... One Tab By Mouth Qhs 10)  Swimmers Ear Drops 95 % Liqd (Isopropyl Alcohol) .Marland Kitchen.. 1 Drop To Affected Ear Three Times Dailty As Needed 11)  Clotrimazole 1 % Crea (Clotrimazole) .... Apply Three Times Daily To Affected Area For 10 Days Then As Needed 12)  Miralax  Powd (Polyethylene Glycol 3350) .Marland KitchenMarland KitchenMarland Kitchen 17 Gm in 8 Ounces of Water Daily 13)  Buspirone Hcl 7.5 Mg Tabs (Buspirone Hcl) .... Take 1 Tablet By Mouth Two Times A Day 14)  Amitiza 8 Mcg Caps (Lubiprostone) .... One Tab By Mouth Once Daily 15)  Hydrocodone-Acetaminophen 7.5-325 Mg Tabs (Hydrocodone-Acetaminophen) .... One Tab By Mouth Every Four Hours As Needed For Pain  Allergies (verified): 1)  ! Pcn 2)  ! Sulfa  Review of Systems      See HPI General:  Complains of chills, fatigue, fever, and malaise. Eyes:  Denies blurring and discharge. ENT:  Complains of hoarseness, nasal congestion, postnasal drainage, and sinus pressure; 2 week hx. CV:  Denies chest pain or discomfort, difficulty breathing while lying down, palpitations, and swelling of feet. Resp:  Complains of cough, shortness of breath, sputum productive, and wheezing; 2 week history. GI:  Denies abdominal pain, constipation, diarrhea, nausea, and vomiting. GU:  Denies dysuria and urinary frequency. Neuro:  Complains of headaches; denies seizures and sensation of room spinning; left sided headache radfiating to back, feels as though fluid in her head. Psych:  Complains of anxiety; denies depression. Endo:  Denies cold intolerance, excessive hunger, excessive thirst, excessive urination, heat intolerance, polyuria, and weight change. Heme:  Denies abnormal bruising and bleeding; shows spider veins on legs askingif they are clots, I advised not . Allergy:  Complains of seasonal allergies.  Physical Exam  General:  Well-developed,well-nourished,in no acute distress;  alert,appropriate and cooperative throughout examination HEENT: No facial asymmetry,  EOMI, left maxillary sinus tenderness, TM's dull oropharynx  pink and moist. left cervical adenitis  Chest: decreased air entry, no crackles few wheezes CVS: S1, S2, No murmurs, No S3.   Abd: Soft, Nontender.  MS: Adequate ROM spine, hips, shoulders and knees.  Ext: No edema.   CNS: CN 2-12 intact, power tone and sensation normal throughout.   Skin: Intact, no visible lesions or rashes.  Psych: Good eye contact, normal affect.  Memory intact, not anxious or depressed appearing.    Impression & Recommendations:  Problem # 1:  HEADACHE (ICD-784.0) Assessment Deteriorated  Her updated medication list for this problem includes:    Naproxen 500 Mg Tabs (Naproxen) .Marland Kitchen... Take 1 tablet by mouth two times a day    Hydrocodone-acetaminophen 7.5-325 Mg Tabs (Hydrocodone-acetaminophen) ..... One tab by mouth every four hours as needed for pain  Orders: Ketorolac-Toradol 15mg  (Z6109) Admin of Therapeutic Inj  intramuscular or subcutaneous (60454)  Problem # 2:  CHRONIC MAXILLARY SINUSITIS (ICD-473.0) Assessment: Comment Only  The following medications were removed from the medication list:    Erythromycin Base 500 Mg Tabs (Erythromycin base) .Marland Kitchen... Take 1 tablet by mouth three times a day    Tussionex Pennkinetic Er 8-10 Mg/47ml Lqcr (Chlorpheniramine-hydrocodone) ..... One teaspoon twice daily as needed Her updated medication list for this problem includes:    Clindamycin Hcl 300 Mg Caps (Clindamycin hcl) .Marland Kitchen... Take 1 capsule by mouth three times a day    Tessalon Perles 100 Mg Caps (Benzonatate) .Marland Kitchen... Take 1 capsule by mouth three times a day  Orders: Radiology other (Radiology Other)  Problem # 3:  NICOTINE ADDICTION (ICD-305.1) Assessment: Unchanged  Encouraged smoking cessation and discussed different methods for smoking cessation.   Problem # 4:  HYPERTENSION (ICD-401.9) Assessment:  Improved  Her updated medication list for this problem includes:    Hydrochlorothiazide 25 Mg Tabs (Hydrochlorothiazide) .Marland Kitchen... Take 1 tablet by mouth once a day  BP today: 130/80 Prior BP: 140/84 (03/02/2009)  Labs Reviewed: K+: 4.2 (02/24/2009) Creat: : 0.77 (02/24/2009)   Chol: 185 (02/24/2009)   HDL: 50 (02/24/2009)   LDL: 88 (02/24/2009)   TG: 237 (02/24/2009)  Problem # 5:  HYPERLIPIDEMIA (ICD-272.4) Assessment: Improved  Her updated medication list for this problem includes:    Simvastatin 20 Mg Tabs (Simvastatin) ..... One tab by mouth qhs  Labs Reviewed: SGOT: 14 (02/24/2009)   SGPT: 13 (02/24/2009)   HDL:50 (02/24/2009), 48 (11/10/2008)  LDL:88 (02/24/2009), 148 (09/81/1914)  Chol:185 (02/24/2009), 246 (11/10/2008)  Trig:237 (02/24/2009), 248 (11/10/2008)  Problem # 6:  OTHER CHRONIC BRONCHITIS (ICD-491.8) Assessment: Comment Only cXR requested andmeds sent in also ptto submitsputum when available  Complete Medication List: 1)  Naproxen 500 Mg Tabs (Naproxen) .... Take 1 tablet by mouth two times a day 2)  Hydrochlorothiazide 25 Mg Tabs (Hydrochlorothiazide) .... Take 1 tablet by mouth once a day 3)  Klor-con M20 20 Meq Cr-tabs (  Potassium chloride crys cr) .... Take 1 tablet by mouth once a day 4)  Femara 2.5 Mg Tabs (Letrozole) .... Take 1 tablet by mouth once a day 5)  Flexeril 10 Mg Tabs (Cyclobenzaprine hcl) .... Take 1 tablet by mouth three times a day 6)  Loratadine 10 Mg Tabs (Loratadine) .... Take 1 tablet by mouth once a day as needed) 7)  Calcium + D 600-200 Mg-unit Tabs (Calcium carbonate-vitamin d) .... 2 tabs once daily 8)  Combivent 103-18 Mcg/act Aero (Ipratropium-albuterol) .... 2 puffs twice daily as needed 9)  Simvastatin 20 Mg Tabs (Simvastatin) .... One tab by mouth qhs 10)  Swimmers Ear Drops 95 % Liqd (Isopropyl alcohol) .Marland Kitchen.. 1 drop to affected ear three times dailty as needed 11)  Clotrimazole 1 % Crea (Clotrimazole) .... Apply three times daily to  affected area for 10 days then as needed 12)  Miralax Powd (Polyethylene glycol 3350) .Marland KitchenMarland Kitchen. 17 gm in 8 ounces of water daily 13)  Buspirone Hcl 7.5 Mg Tabs (Buspirone hcl) .... Take 1 tablet by mouth two times a day 14)  Amitiza 8 Mcg Caps (Lubiprostone) .... One tab by mouth once daily 15)  Hydrocodone-acetaminophen 7.5-325 Mg Tabs (Hydrocodone-acetaminophen) .... One tab by mouth every four hours as needed for pain 16)  Clindamycin Hcl 300 Mg Caps (Clindamycin hcl) .... Take 1 capsule by mouth three times a day 17)  Tessalon Perles 100 Mg Caps (Benzonatate) .... Take 1 capsule by mouth three times a day  Other Orders: CXR- 2view (CXR) T-CBC w/Diff ([id number]) T-Culture, Sputum & Gram Stain ([id number])  Patient Instructions: 1)  Please schedule a follow-up appointment in 3 months. 2)  It is important that you exercise regularly at least 20 minutes 5 times a week. If you develop chest pain, have severe difficulty breathing, or feel very tired , stop exercising immediately and seek medical attention. 3)  You need to lose weight. Consider a lower calorie diet and regular exercise.  4)  You are being treated for left maxillary sinusitis, and cervicall adenitis., also bronchitis.Med is sent in for bronchitis and sinusitis. 5)  You are being referred for a scan of sinuses, also for a cxr 6)  Tobacco is very bad for your health and your loved ones! You Should stop smoking!. 7)  Stop Smoking Tips: Choose a Quit date. Cut down before the Quit date. decide what you will do as a substitute when you feel the urge to smoke(gum,toothpick,exercise). Prescriptions: TESSALON PERLES 100 MG CAPS (BENZONATATE) Take 1 capsule by mouth three times a day  #42 x 0   Entered and Authorized by:   [name] MD   Signed by:   [name] MD on 05/02/2009   Method used:   Electronically to        [facility]* (retail)       [address]       [place], [place]  [address]       Ph: [phone]       Fax: [phone]   RxID:   [id number] CLINDAMYCIN HCL 300 MG CAPS (CLINDAMYCIN HCL) Take 1 capsule by mouth three times a day  #42 x 0   Entered and Authorized by:   [name] MD   Signed by:   [name] MD on 05/02/2009   Method used:   Electronically to        [facility]* (retail)  [address]       [place], [place]  [address]       Ph: [phone]       Fax: [phone]   RxID:   [id number]    Medication Administration  Injection # 1:    Medication: Ketorolac-Toradol 15mg     Diagnosis: HEADACHE (ICD-784.0)    Route: IM    Site: RUOQ gluteus    Exp Date: 12/16/2010    Lot #: [id number]    Mfr: hospira    Comments: toradol 60mg  given    Patient tolerated injection without complications    Given by: Adella Hare LPN (May 02, 2009 5:06 PM)  Orders Added: 1)  Est. Patient Level IV [53664] 2)  CXR- 2view [CXR] 3)  Radiology other [Radiology Other] 4)  T-CBC w/Diff [[id number]] 5)  T-Culture, Sputum & Gram Stain [[id number]] 6)  Ketorolac-Toradol 15mg  [J1885] 7)  Admin of Therapeutic Inj  intramuscular or subcutaneous [63875]
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
## 2010-02-14 NOTE — Letter (Signed)
Summary: incident/investigation report  incident/investigation report   Imported By: Lind Guest 06/24/2009 16:01:11  _____________________________________________________________________  External Attachment:    Type:   Image     Comment:   External Document

## 2010-02-14 NOTE — Assessment & Plan Note (Signed)
Summary: office visit room 2    Vital Signs:  Patient profile:   65 year old female Menstrual status:  hysterectomy Height:      64.5 inches Weight:      168 pounds BMI:     28.49 O2 Sat:      95 % Temp:     98.6 degrees F oral Pulse rate:   104 / minute Resp:     16 per minute BP sitting:   128 / 82  (left arm) Cuff size:   large  Vitals Entered By: Everitt Amber LPN (October 18, 2009 1:24 PM) CC: cough and congestion producing yellowish phlegm, going on for couple weeks    CC:  cough and congestion producing yellowish phlegm and going on for couple weeks .  History of Present Illness: Pt presents today with head and chest congestion x 2 weeks. Her cough is productive with discolored phlegm.  She has wheezing and tightnes.  Has albuterol at home to use as needed and helps.  Has not used any today.  No fever.  No ear pain or sore throat.  Allergies: 1)  ! Pcn 2)  ! Sulfa  Past History:  Past medical history reviewed for relevance to current acute and chronic problems.  Past Medical History: Reviewed history from 09/29/2008 and no changes required. Hypertension x early adulthood Early left breast cancer, removed in 2006 Osteoarthritis COPD h/o kidney stones nicotine dependence  Review of Systems General:  Denies chills and fever. ENT:  Denies earache, nasal congestion, sinus pressure, and sore throat. CV:  Denies chest pain or discomfort and palpitations. Resp:  Complains of cough, shortness of breath, sputum productive, and wheezing.  Physical Exam  General:  Well-developed,well-nourished,in no acute distress; alert,appropriate and cooperative throughout examination Head:  Normocephalic and atraumatic without obvious abnormalities. No apparent alopecia or balding. Ears:  External ear exam shows no significant lesions or deformities.  Otoscopic examination reveals clear canals, tympanic membranes are intact bilaterally without bulging, retraction, inflammation or  discharge. Hearing is grossly normal bilaterally. Nose:  External nasal examination shows no deformity or inflammation. Nasal mucosa are pink and moist without lesions or exudates. Mouth:  Oral mucosa and oropharynx without lesions or exudates.   Neck:  No deformities, masses, or tenderness noted. Lungs:  snorous exp rhonci and wheeze bilat.  after NMT CTA Heart:  Normal rate and regular rhythm. S1 and S2 normal without gallop, murmur, click, rub or other extra sounds. Cervical Nodes:  No lymphadenopathy noted Psych:  Cognition and judgment appear intact. Alert and cooperative with normal attention span and concentration. No apparent delusions, illusions, hallucinations   Impression & Recommendations:  Problem # 1:  OTHER CHRONIC BRONCHITIS (ICD-491.8) Assessment Deteriorated  Orders: Depo- Medrol 80mg  (J1040) Admin of Therapeutic Inj  intramuscular or subcutaneous (78469) Albuterol Sulfate Sol 1mg  unit dose (G2952) Nebulizer Tx (84132)  Problem # 2:  NICOTINE ADDICTION (ICD-305.1) Assessment: Comment Only encouraged to discontinue smoking  Complete Medication List: 1)  Naproxen 500 Mg Tabs (Naproxen) .... Take 1 tablet by mouth two times a day 2)  Hydrochlorothiazide 25 Mg Tabs (Hydrochlorothiazide) .... Take 1 tablet by mouth once a day 3)  Femara 2.5 Mg Tabs (Letrozole) .... Take 1 tablet by mouth once a day 4)  Calcium + D 600-200 Mg-unit Tabs (Calcium carbonate-vitamin d) .... 2 tabs once daily 5)  Combivent 103-18 Mcg/act Aero (Ipratropium-albuterol) .... 2 puffs twice daily as needed 6)  Simvastatin 20 Mg Tabs (Simvastatin) .... One tab  by mouth qhs 7)  Hydrocodone-acetaminophen 7.5-325 Mg Tabs (Hydrocodone-acetaminophen) .... One tab by mouth every four hours as needed for pain 8)  Loratadine 10 Mg Tabs (Loratadine) .... One tab by mouth once daily 9)  Klor-con 20 Meq Pack (Potassium chloride) .... One tab by mouth once daily 10)  Flexeril 10 Mg Tabs (Cyclobenzaprine hcl)  .... One tab by mouth once daily every 8 hours as needed 11)  Qvar 40 Mcg/act Aers (Beclomethasone dipropionate) .... 2 puffs twice daily 12)  Doxycycline Hyclate 100 Mg Caps (Doxycycline hyclate) .... Take 1 two times a day x 10 days  Patient Instructions: 1)  Keep your next scheduled appt with Dr Lodema Hong in November.  We will see you back sooner if you do not improve or feel that you are worsening.  2)  You received a shot today to help with your cough and wheezing. 3)  I have sent your antibiotic prescription to [facility] as you requested. 4)  Tobacco is very bad for your health and your loved ones! You Should stop smoking!. 5)  Stop Smoking Tips: Choose a Quit date. Cut down before the Quit date. decide what you will do as a substitute when you feel the urge to smoke(gum,toothpick,exercise). Prescriptions: DOXYCYCLINE HYCLATE 100 MG CAPS (DOXYCYCLINE HYCLATE) take 1 two times a day x 10 days  #20 x 0   Entered and Authorized by:   Esperanza Sheets PA   Signed by:   Esperanza Sheets PA on 10/18/2009   Method used:   Electronically to        [facility]* (retail)       [address]       [place], [place]  [address]       Ph: [phone]       Fax: [phone]   RxID:   [id number]    Medication Administration  Injection # 1:    Medication: Depo- Medrol 80mg     Diagnosis: OTHER CHRONIC BRONCHITIS (ICD-491.8)    Route: IM    Site: RUOQ gluteus    Exp Date: 05/2010    Lot #: [id number]    Mfr: Pharmacia    Comments: 80mg  given     Patient tolerated injection without complications    Given by: Everitt Amber LPN (October 18, 2009 3:02 PM)  Medication # 1:    Medication: Albuterol Sulfate Sol 1mg  unit dose    Diagnosis: OTHER CHRONIC BRONCHITIS (ICD-491.8)    Dose: 2.5/7ml     Route: inhaled    Exp Date: 8/12    Lot #: [id number]    Mfr: nephron    Patient tolerated medication without complications    Given by: Everitt Amber LPN (October 18, 2009 3:02 PM)  Orders  Added: 1)  Depo- Medrol 80mg  [J1040] 2)  Admin of Therapeutic Inj  intramuscular or subcutaneous [96372] 3)  Albuterol Sulfate Sol 1mg  unit dose [J7613] 4)  Nebulizer Tx [94640] 5)  Est. Patient Level III [38756]

## 2010-02-14 NOTE — Progress Notes (Signed)
Summary: [name]  [facility]   Imported By: Lind Guest 02/18/2009 09:27:54  _____________________________________________________________________  External Attachment:    Type:   Image     Comment:   External Document

## 2010-02-14 NOTE — Letter (Signed)
Summary: [facility]  [facility]   Imported By: Lind Guest 09/15/2009 16:19:40  _____________________________________________________________________  External Attachment:    Type:   Image     Comment:   External Document

## 2010-02-14 NOTE — Progress Notes (Signed)
Summary: MED  Phone Note Call from Patient   Summary of Call: NEEDS A REFILL ON DOXYCYCLINE   [facility]  CALL BACK AT [phone] Initial call taken by: Lind Guest,  November 08, 2009 3:39 PM  Follow-up for Phone Call        NO FEVER, BUT CHILLS AND BODY ACHES COUGHING, NOT MUCH SPUTUM BUT CLEAR Follow-up by: Adella Hare LPN,  November 09, 2009 11:48 AM  Additional Follow-up for Phone Call Additional follow up Details #1::        just completed abx, tylenol alt with ibuprofen if she has sputum needs to submit for culture will become resistant to abx if she keeps taking them Additional Follow-up by: [name] MD,  November 09, 2009 1:00 PM    Additional Follow-up for Phone Call Additional follow up Details #2::    PATIENT AWARE Follow-up by: Adella Hare LPN,  November 11, 2009 3:25 PM

## 2010-02-14 NOTE — Progress Notes (Signed)
Summary: referral  Phone Note Call from Patient   Summary of Call: This pt has appt with Dr. Nile Riggs for 03/23/2009 2:40. pt is aware and was given appt before she left office. Even wrote it on appt card.  Initial call taken by: Rudene Anda,  March 02, 2009 4:15 PM

## 2010-02-14 NOTE — Medication Information (Signed)
Summary: Tax adviser   Imported By: Lind Guest 12/02/2009 14:36:19  _____________________________________________________________________  External Attachment:    Type:   Image     Comment:   External Document

## 2010-02-14 NOTE — Progress Notes (Signed)
Summary: MEDICINE  Phone Note Call from Patient   Summary of Call: WENT TO DR. Gerilyn Pilgrim AND HE GAVE HER A DIFFERENT KIND OF PAIN  MEDICINE  AND IT IS NOT HELPING AND SAID THAT IT IS NERVES AND WOULD YOU GIVE HER RX FOR XANAX 0.5 [place] APOT CALL BACK AND LET HER KNOW Initial call taken by: Lind Guest,  March 24, 2009 4:39 PM  Follow-up for Phone Call        advise i recommend buspar for anxiety if that does not work i Pharmacist, community to eval and treat her anxiety (she has a h/o losing xanax per her record) If she wants to try the buspar i have already sent it in she neeeds to clllect and start it, if not hen behav health referral Follow-up by: [name] MD,  March 25, 2009 1:03 PM  Additional Follow-up for Phone Call Additional follow up Details #1::        TRIED TO RETURN CALL X 2  BUSY SIGNAL Additional Follow-up by: Adella Hare LPN,  March 25, 2009 2:23 PM    Additional Follow-up for Phone Call Additional follow up Details #2::    patient aware Follow-up by: Adella Hare LPN,  March 28, 2009 9:25 AM  New/Updated Medications: BUSPIRONE HCL 7.5 MG TABS (BUSPIRONE HCL) Take 1 tablet by mouth two times a day Prescriptions: BUSPIRONE HCL 7.5 MG TABS (BUSPIRONE HCL) Take 1 tablet by mouth two times a day  #60 x 4   Entered and Authorized by:   [name] MD   Signed by:   [name] MD on 03/25/2009   Method used:   Electronically to        [facility]* (retail)       [address]       [place], [place]  [address]       Ph: [phone]       Fax: [phone]   RxID:   [id number]

## 2010-02-14 NOTE — Assessment & Plan Note (Signed)
Summary: office visit   Vital Signs:  Patient profile:   65 year old female Menstrual status:  hysterectomy Height:      64.5 inches Weight:      167.25 pounds BMI:     28.37 O2 Sat:      96 % on Room air Pulse rate:   99 / minute Pulse rhythm:   regular Resp:     16 per minute BP sitting:   150 / 70  (left arm)  Vitals Entered By: Adella Hare LPN (July 21, 452 1:12 PM)  Nutrition Counseling: Patient's BMI is greater than 25 and therefore counseled on weight management options.  O2 Flow:  Room air CC: cough and congestion Is Patient Diabetic? No Pain Assessment Patient in pain? no        CC:  cough and congestion.  History of Present Illness: Pt continues to cough , she also continues to smoke up to 1PPd.She has had intermittent chills , and up to 2 days ago she experienced severe chills had to sleep under a blanket. She reports improved anxiety .despite being off pof xanax, which is great. she denies depression. She is conncerned aout the possibility of being diabetic since it runs in her family. She denies polyuria, polydpsia or blurred vision.  Preventive Screening-Counseling & Management  Alcohol-Tobacco     Smoking Cessation Counseling: yes  Current Medications (verified): 1)  Naproxen 500 Mg Tabs (Naproxen) .... Take 1 Tablet By Mouth Two Times A Day 2)  Hydrochlorothiazide 25 Mg Tabs (Hydrochlorothiazide) .... Take 1 Tablet By Mouth Once A Day 3)  Femara 2.5 Mg Tabs (Letrozole) .... Take 1 Tablet By Mouth Once A Day 4)  Calcium + D 600-200 Mg-Unit Tabs (Calcium Carbonate-Vitamin D) .... 2 Tabs Once Daily 5)  Combivent 103-18 Mcg/act Aero (Ipratropium-Albuterol) .... 2 Puffs Twice Daily As Needed 6)  Simvastatin 20 Mg Tabs (Simvastatin) .... One Tab By Mouth Qhs 7)  Hydrocodone-Acetaminophen 7.5-325 Mg Tabs (Hydrocodone-Acetaminophen) .... One Tab By Mouth Every Four Hours As Needed For Pain 8)  Loratadine 10 Mg Tabs (Loratadine) .... One Tab By Mouth Once  Daily 9)  Klor-Con 20 Meq Pack (Potassium Chloride) .... One Tab By Mouth Once Daily 10)  Flexeril 10 Mg Tabs (Cyclobenzaprine Hcl) .... One Tab By Mouth Once Daily Every 8 Hours As Needed  Allergies (verified): 1)  ! Pcn 2)  ! Sulfa  Review of Systems General:  Complains of fatigue. Eyes:  Denies discharge, eye pain, and red eye. ENT:  Complains of nasal congestion and postnasal drainage. CV:  Denies chest pain or discomfort, palpitations, and swelling of feet. Resp:  Complains of cough, shortness of breath, sputum productive, and wheezing; chronic nicotine use,22year pack history. GI:  Complains of constipation; bowels  appear to lock up in the past 3 months, she has concerns about recurrent exposure to hep C and wants to be tested. GU:  Complains of dysuria and urinary frequency; 1 week history, no fever or chills or flank pain. MS:  Complains of joint pain and stiffness; right knee, shoulder, right hip, ortho prescribes pain meds and muscle relaxant. Derm:  Denies itching, lesion(s), and rash. Neuro:  Denies headaches, seizures, and sensation of room spinning. Psych:  Complains of anxiety; denies depression. Endo:  Denies cold intolerance, excessive thirst, excessive urination, and heat intolerance. Heme:  Denies abnormal bruising and bleeding. Allergy:  Complains of seasonal allergies.  Physical Exam  General:  Well-developed,well-nourished,in no acute distress; alert,appropriate and cooperative throughout examination  HEENT: No facial asymmetry,  EOMI, No sinus tenderness, TM's Clear, oropharynx  pink and moist.   Chest: decreased air entry, few wheezes, no crackles CVS: S1, S2, No murmurs, No S3.   Abd: Soft, No suprapubic or renal angle tenderness MS: Adequate ROM spine, hips, shoulders and knees.  Ext: No edema.   CNS: CN 2-12 intact, power tone and sensation normal throughout.   Skin: Intact, no visible lesions or rashes.  Psych: Good eye contact, normal affect.  Memory  intact, not anxious or depressed appearing.    Impression & Recommendations:  Problem # 1:  ACUTE CYSTITIS (ICD-595.0) Assessment Comment Only  Her updated medication list for this problem includes:    Keflex 500 Mg Caps (Cephalexin) .Marland Kitchen... Take 1 capsule by mouth two times a day  Orders: Urinalysis ([id number]) T-Culture, Urine ([id number])  Encouraged to push clear liquids, get enough rest, and take acetaminophen as needed. To be seen in 10 days if no improvement, sooner if worse.  Problem # 2:  HYPERLIPIDEMIA (ICD-272.4) Assessment: Improved  Her updated medication list for this problem includes:    Simvastatin 20 Mg Tabs (Simvastatin) ..... One tab by mouth qhs  Orders: T-Hepatic Function [id number]) T-Lipid Profile [id number])  Labs Reviewed: SGOT: 14 (02/24/2009)   SGPT: 13 (02/24/2009)   HDL:50 (02/24/2009), 48 (11/10/2008)  LDL:88 (02/24/2009), 148 (28/41/3244)  Chol:185 (02/24/2009), 246 (11/10/2008)  Trig:237 (02/24/2009), 248 (11/10/2008)  Problem # 3:  NICOTINE ADDICTION (ICD-305.1) Assessment: Unchanged  Encouraged smoking cessation and discussed different methods for smoking cessation.   Problem # 4:  HYPERTENSION (ICD-401.9) Assessment: Deteriorated  Her updated medication list for this problem includes:    Hydrochlorothiazide 25 Mg Tabs (Hydrochlorothiazide) .Marland Kitchen... Take 1 tablet by mouth once a day  BP today: 150/70 Prior BP: 142/80 (06/28/2009)  Labs Reviewed: K+: 4.2 (02/24/2009) Creat: : 0.77 (02/24/2009)   Chol: 185 (02/24/2009)   HDL: 50 (02/24/2009)   LDL: 88 (02/24/2009)   TG: 237 (02/24/2009)  Problem # 5:  ADJ DISORDER WITH MIXED ANXIETY & DEPRESSED MOOD (ICD-309.28) Assessment: Improved PT NO LONGER ON XANAX AND IS DOING WELL  Problem # 6:  OTHER CHRONIC BRONCHITIS (ICD-491.8) Assessment: Unchanged  Problem # 7:  COPD (ICD-496) Assessment: Deteriorated  Her updated medication list for this problem includes:    Combivent 103-18  Mcg/act Aero (Ipratropium-albuterol) .Marland Kitchen... 2 puffs twice daily as needed    Qvar 40 Mcg/act Aers (Beclomethasone dipropionate) .Marland Kitchen... 2 puffs twice daily  Pulmonary Functions Reviewed: O2 sat: 96 (07/21/2009)     Vaccines Reviewed: Flu Vax: Fluvax MCR (11/18/2008)  Complete Medication List: 1)  Naproxen 500 Mg Tabs (Naproxen) .... Take 1 tablet by mouth two times a day 2)  Hydrochlorothiazide 25 Mg Tabs (Hydrochlorothiazide) .... Take 1 tablet by mouth once a day 3)  Femara 2.5 Mg Tabs (Letrozole) .... Take 1 tablet by mouth once a day 4)  Calcium + D 600-200 Mg-unit Tabs (Calcium carbonate-vitamin d) .... 2 tabs once daily 5)  Combivent 103-18 Mcg/act Aero (Ipratropium-albuterol) .... 2 puffs twice daily as needed 6)  Simvastatin 20 Mg Tabs (Simvastatin) .... One tab by mouth qhs 7)  Hydrocodone-acetaminophen 7.5-325 Mg Tabs (Hydrocodone-acetaminophen) .... One tab by mouth every four hours as needed for pain 8)  Loratadine 10 Mg Tabs (Loratadine) .... One tab by mouth once daily 9)  Klor-con 20 Meq Pack (Potassium chloride) .... One tab by mouth once daily 10)  Flexeril 10 Mg Tabs (Cyclobenzaprine hcl) .... One tab by mouth once daily every 8  hours as needed 11)  Keflex 500 Mg Caps (Cephalexin) .... Take 1 capsule by mouth two times a day 12)  Qvar 40 Mcg/act Aers (Beclomethasone dipropionate) .... 2 puffs twice daily  Other Orders: Hepatitis C Antibody ([id number]) T- Hemoglobin A1C ([id number]) Hepatitis C Antibody ([id number]) T-Hepatitis C Antibody ([id number])  Patient Instructions: 1)  F/u Nov 15 or after. 2)  Hepatic Panel prior to visit, ICD-9:  fasting in November 3)  Lipid Panel prior to visit, ICD-9: 4)  Med is sent in for your urinary symptoms 5)  Labs today HBA1C and hepatitisc titer 6)  Tobacco is very bad for your health and your loved ones! You Should stop smoking!. 7)  Stop Smoking Tips: Choose a Quit date. Cut down before the Quit date. decide what you will  do as a substitute when you feel the urge to smoke(gum,toothpick,exercise). 8)  It is important that you exercise regularly at least 20 minutes 5 times a week. If you develop chest pain, have severe difficulty breathing, or feel very tired , stop exercising immediately and seek medical attention. 9)  You need to lose weight. Consider a lower calorie diet and regular exercise.  10)  New med for breathing. Prescriptions: QVAR 40 MCG/ACT AERS (BECLOMETHASONE DIPROPIONATE) 2 puffs twice daily  #1 x 3   Entered and Authorized by:   [name] MD   Signed by:   [name] MD on 07/21/2009   Method used:   Electronically to        [facility] (retail)       [address] [place], [place]  [address]       Ph: [phone]       Fax: [phone]   RxID:   [id number] KEFLEX 500 MG CAPS (CEPHALEXIN) Take 1 capsule by mouth two times a day  #10 x 0   Entered and Authorized by:   [name] MD   Signed by:   [name] MD on 07/21/2009   Method used:   Electronically to        [facility] (retail)       [address]       [place], [place]  [address]       Ph: [phone]       Fax: [phone]   RxID:   [id number]   Laboratory Results   Urine Tests  Date/Time Received: July 21, 2009 2:53 PM  Date/Time Reported: July 21, 2009 2:53 PM   Routine Urinalysis   Color: yellow Appearance: Clear Glucose: negative   (Normal Range: Negative) Bilirubin: negative   (Normal Range: Negative) Ketone: negative   (Normal Range: Negative) Spec. Gravity: 1.015   (Normal Range: 1.003-1.035) Blood: negative   (Normal Range: Negative) pH: 5.0   (Normal Range: 5.0-8.0) Protein: negative   (Normal Range: Negative) Urobilinogen: 0.2   (Normal Range: 0-1) Nitrite: negative   (Normal Range: Negative) Leukocyte Esterace: small   (Normal Range: Negative)

## 2010-02-14 NOTE — Progress Notes (Signed)
Summary: [facility]  Phone Note Call from Patient   Summary of Call: SAID [name] SAID THAT SHE WOULD SEND IN FOR 10 POT PILLS PLEASE SEND TO [facility] Initial call taken by: Lind Guest,  June 29, 2009 3:00 PM  Follow-up for Phone Call        I sent prescription for #15 yesterday to last her until her regular refill is due.  Follow-up by: Esperanza Sheets PA,  June 29, 2009 3:22 PM  Additional Follow-up for Phone Call Additional follow up Details #1::        Phone Call Completed Additional Follow-up by: Adella Hare LPN,  June 29, 2009 4:17 PM

## 2010-02-14 NOTE — Assessment & Plan Note (Signed)
Summary: F UP   Vital Signs:  Patient profile:   65 year old female Menstrual status:  hysterectomy Height:      64.5 inches Weight:      168.50 pounds BMI:     28.58 O2 Sat:      95 % on Room air Pulse rate:   106 / minute Pulse rhythm:   regular Resp:     16 per minute BP sitting:   142 / 82  (left arm)  Vitals Entered By: Mauricia Area CMA (November 30, 2009 1:10 PM)  Nutrition Counseling: Patient's BMI is greater than 25 and therefore counseled on weight management options.  O2 Flow:  Room air CC: Follow up. Right heel hurts when pressure is applied, and pain radiates up leg   CC:  Follow up. Right heel hurts when pressure is applied and and pain radiates up leg.  History of Present Illness: 34month h/o right leg pain, she has been followed for this by her oertho doc, Hilda Lias and has appt tomorrow for f/u Reports  that tshe is otherwise doing well. Denies recent fever or chills. Denies sinus pressure, nasal congestion , ear pain or sore throat. Denies chest congestion, or cough productive of sputum. Denies chest pain, palpitations, PND, orthopnea or leg swelling. Denies abdominal pain, nausea, vomitting, diarrhea or constipation. Denies change in bowel movements or bloody stool. Denies dysuria , frequency, incontinence or hesitancy. nicotine use is unchanged  Denies headaches, vertigo, seizures. Denies depression, anxiety or insomnia. Denies  rash, lesions, or itch.     Current Medications (verified): 1)  Naproxen 500 Mg Tabs (Naproxen) .... Take 1 Tablet By Mouth Two Times A Day 2)  Hydrochlorothiazide 25 Mg Tabs (Hydrochlorothiazide) .... Take 1 Tablet By Mouth Once A Day 3)  Femara 2.5 Mg Tabs (Letrozole) .... Take 1 Tablet By Mouth Once A Day 4)  Calcium + D 600-200 Mg-Unit Tabs (Calcium Carbonate-Vitamin D) .... 2 Tabs Once Daily 5)  Combivent 103-18 Mcg/act Aero (Ipratropium-Albuterol) .... 2 Puffs Twice Daily As Needed 6)  Simvastatin 20 Mg Tabs  (Simvastatin) .... One Tab By Mouth Qhs 7)  Hydrocodone-Acetaminophen 7.5-325 Mg Tabs (Hydrocodone-Acetaminophen) .... One Tab By Mouth Every Four Hours As Needed For Pain 8)  Loratadine 10 Mg Tabs (Loratadine) .... One Tab By Mouth Once Daily 9)  Klor-Con 20 Meq Pack (Potassium Chloride) .... One Tab By Mouth Once Daily 10)  Flexeril 10 Mg Tabs (Cyclobenzaprine Hcl) .... One Tab By Mouth Once Daily Every 8 Hours As Needed 11)  Qvar 40 Mcg/act Aers (Beclomethasone Dipropionate) .... 2 Puffs Twice Daily 12)  Ibuprofen 200 Mg .... Take As Needed 13)  Pain Relief Pm .... Take As Needed  Allergies (verified): 1)  ! Pcn 2)  ! Sulfa  Review of Systems      See HPI ENT:  Complains of nasal congestion and ringing in ears; denies earache, sinus pressure, and sore throat; 2 day history. MS:  Complains of joint pain; right heel pain with direct pressure. Psych:  Complains of anxiety and depression; denies mental problems, suicidal thoughts/plans, thoughts of violence, and unusual visions or sounds. Endo:  Denies cold intolerance, excessive hunger, excessive thirst, and excessive urination. Heme:  Denies abnormal bruising and bleeding. Allergy:  Denies hives or rash and itching eyes.  Physical Exam  General:  Well-developed,well-nourished,in no acute distress; alert,appropriate and cooperative throughout examination HEENT: No facial asymmetry,  EOMI, No sinus tenderness, TM's Clear, oropharynx  pink and moist.   Chest: Clear to  auscultation bilaterally. decreased air entry bilaterally CVS: S1, S2, No murmurs, No S3.   Abd: Soft, Nontender.  MS: Adequate ROM spine, hips, shoulders and knees. tender over right heel Ext: No edema.   CNS: CN 2-12 intact, power tone and sensation normal throughout.   Skin: Intact, no visible lesions or rashes.  Psych: Good eye contact, normal affect.  Memory intact, not anxious or depressed appearing.    Impression & Recommendations:  Problem # 1:  COPD  (ICD-496) Assessment Deteriorated  Her updated medication list for this problem includes:    Combivent 103-18 Mcg/act Aero (Ipratropium-albuterol) .Marland Kitchen... 2 puffs twice daily as needed    Qvar 40 Mcg/act Aers (Beclomethasone dipropionate) .Marland Kitchen... 2 puffs twice daily  Problem # 2:  HYPERLIPIDEMIA (ICD-272.4) Assessment: Comment Only  Her updated medication list for this problem includes:    Simvastatin 20 Mg Tabs (Simvastatin) ..... One tab by mouth qhs  Orders: T-Hepatic Function [id number]) T-Lipid Profile [id number]) Low fat dietdiscussed and encouraged  Labs Reviewed: SGOT: 16 (07/21/2009)   SGPT: 13 (07/21/2009)   HDL:55 (07/21/2009), 50 (02/24/2009)  LDL:85 (07/21/2009), 88 (02/24/2009)  Chol:165 (07/21/2009), 185 (02/24/2009)  Trig:124 (07/21/2009), 237 (02/24/2009)  Problem # 3:  HYPERTENSION (ICD-401.9) Assessment: Deteriorated  The following medications were removed from the medication list:    Hydrochlorothiazide 25 Mg Tabs (Hydrochlorothiazide) .Marland Kitchen... Take 1 tablet by mouth once a day Her updated medication list for this problem includes:    Lotensin Hct 20-12.5 Mg Tabs (Benazepril-hydrochlorothiazide) .Marland Kitchen... Take 1 tablet by mouth once a day  Orders: Medicare Electronic Prescription [phone]) T-Basic Metabolic Panel [phone])  BP today: 142/82 Prior BP: 128/82 (10/18/2009)  Labs Reviewed: K+: 4.2 (02/24/2009) Creat: : 0.77 (02/24/2009)   Chol: 165 (07/21/2009)   HDL: 55 (07/21/2009)   LDL: 85 (07/21/2009)   TG: 124 (07/21/2009)  Problem # 4:  NICOTINE ADDICTION (ICD-305.1) Assessment: Unchanged  Encouraged smoking cessation and discussed different methods for smoking cessation.   Problem # 5:  ADJ DISORDER WITH MIXED ANXIETY & DEPRESSED MOOD (ICD-309.28) Assessment: Unchanged  Complete Medication List: 1)  Naproxen 500 Mg Tabs (Naproxen) .... Take 1 tablet by mouth two times a day 2)  Femara 2.5 Mg Tabs (Letrozole) .... Take 1 tablet by mouth once a  day 3)  Calcium + D 600-200 Mg-unit Tabs (Calcium carbonate-vitamin d) .... 2 tabs once daily 4)  Combivent 103-18 Mcg/act Aero (Ipratropium-albuterol) .... 2 puffs twice daily as needed 5)  Simvastatin 20 Mg Tabs (Simvastatin) .... One tab by mouth qhs 6)  Hydrocodone-acetaminophen 7.5-325 Mg Tabs (Hydrocodone-acetaminophen) .... One tab by mouth every four hours as needed for pain 7)  Loratadine 10 Mg Tabs (Loratadine) .... One tab by mouth once daily 8)  Klor-con 20 Meq Pack (Potassium chloride) .... One tab by mouth once daily 9)  Flexeril 10 Mg Tabs (Cyclobenzaprine hcl) .... One tab by mouth once daily every 8 hours as needed 10)  Qvar 40 Mcg/act Aers (Beclomethasone dipropionate) .... 2 puffs twice daily 11)  Ibuprofen 200 Mg  .... Take as needed 12)  Pain Relief Pm  .... Take as needed 13)  Lotensin Hct 20-12.5 Mg Tabs (Benazepril-hydrochlorothiazide) .... Take 1 tablet by mouth once a day 14)  Klor-con M10 10 Meq Cr-tabs (Potassium chloride crys cr) .... One tab by mouth once daily  Other Orders: T-TSH ([id number])  Patient Instructions: 1)  Please schedule a follow-up appointment in end january. 2)  It is important that you exercise regularly at least 20 minutes 5 times  a week. If you develop chest pain, have severe difficulty breathing, or feel very tired , stop exercising immediately and seek medical attention. 3)  You need to lose weight. Consider a lower calorie diet and regular exercise.  4)  BMP prior to visit, ICD-9: 5)  Hepatic Panel prior to visit, ICD-9:  fasting end January 6)  Lipid Panel prior to visit, ICD-9: 7)  TSH prior to visit, ICD-9: 8)  flu vaccine today. 9)  you need to start a new bP med when hydrochorothiazide is finshed, pls do not refill this Prescriptions: LOTENSIN HCT 20-12.5 MG TABS (BENAZEPRIL-HYDROCHLOROTHIAZIDE) Take 1 tablet by mouth once a day  #30 x 3   Entered and Authorized by:   [name] MD   Signed by:   [name] MD on  11/30/2009   Method used:   Printed then faxed to ...       [facility] (retail)       [address]       [place], [place]  [address]       Ph: [phone]       Fax: [phone]   RxID:   [id number]    Orders Added: 1)  Est. Patient Level IV [84132] 2)  Medicare Electronic Prescription [G8553] 3)  T-Basic Metabolic Panel [[id number]] 4)  T-Hepatic Function [[id number]] 5)  T-Lipid Profile [[id number]] 6)  T-TSH [[id number]]
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
## 2010-02-14 NOTE — Progress Notes (Signed)
Summary: results  Phone Note Call from Patient   Summary of Call: would like to get results  [phone] Initial call taken by: Rudene Anda,  April 26, 2009 2:20 PM  Follow-up for Phone Call        advised pap normal Follow-up by: Adella Hare LPN,  April 26, 2009 3:43 PM
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
## 2010-02-16 NOTE — Progress Notes (Signed)
Summary: bp medicine  Phone Note Call from Patient   Summary of Call: needs a nurse to call her she has 2 different medications for her blood pressure [phone] or if not there call at her home Initial call taken by: Lind Guest,  February 08, 2010 4:21 PM  Follow-up for Phone Call        returned call, no answer additional # incorrect Follow-up by: Adella Hare LPN,  February 08, 2010 5:21 PM  Additional Follow-up for Phone Call Additional follow up Details #1::        Spoke with patient regarding 2 different looking pills from 2 different pharmacys. The pills were the same. Patient aware Additional Follow-up by: Everitt Amber LPN,  February 09, 2010 11:04 AM

## 2010-02-16 NOTE — Progress Notes (Signed)
Summary: medicine  Phone Note Call from Patient   Summary of Call: pt has a question about taking calicum pills with blood pressure meds.   [phone] Initial call taken by: Rudene Anda,  January 23, 2010 4:57 PM  Follow-up for Phone Call        called patient no answer Follow-up by: Everitt Amber LPN,  January 24, 2010 2:46 PM  Additional Follow-up for Phone Call Additional follow up Details #1::        Patient states her paper that came with her rx for benazepril hct said you are not supposed to take calcium while on it. I advised her to call the pharmacist and she said they wanted her to call us. I toldher i didn't think it was a problem but she wanted me to check with you. Additional Follow-up by: Everitt Amber LPN,  January 25, 2010 9:51 AM    Additional Follow-up for Phone Call Additional follow up Details #2::    It is fine for pts to call the doc's office fior med questions, becuse if I am prescribing/recommending them I take the ultimate responsibility. She needs to take her calcium supplements for bone protection, she does not have a klidney stone prob, and even if she did this is most often recommended. Follow-up by: [name] MD,  January 25, 2010 10:02 AM  Additional Follow-up for Phone Call Additional follow up Details #3:: Details for Additional Follow-up Action Taken: called patient, no answer Additional Follow-up by: Everitt Amber LPN,  January 25, 2010 11:18 AM  Patient aware

## 2010-02-18 DIAGNOSIS — J209 Acute bronchitis, unspecified: Secondary | ICD-10-CM | POA: Insufficient documentation
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
## 2010-03-07 ENCOUNTER — Ambulatory Visit (HOSPITAL_COMMUNITY): Payer: Self-pay | Admitting: Oncology

## 2010-03-13 ENCOUNTER — Ambulatory Visit (HOSPITAL_COMMUNITY): Payer: Medicare Other | Admitting: Oncology

## 2010-03-13 ENCOUNTER — Other Ambulatory Visit (HOSPITAL_COMMUNITY): Payer: Self-pay | Admitting: Oncology

## 2010-03-13 ENCOUNTER — Encounter: Payer: Self-pay | Admitting: Family Medicine

## 2010-03-13 DIAGNOSIS — C50919 Malignant neoplasm of unspecified site of unspecified female breast: Secondary | ICD-10-CM

## 2010-03-13 DIAGNOSIS — J449 Chronic obstructive pulmonary disease, unspecified: Secondary | ICD-10-CM

## 2010-03-30 LAB — DIFFERENTIAL
Basophils Absolute: 0.1 10*3/uL (ref 0.0–0.1)
Eosinophils Absolute: 0.3 10*3/uL (ref 0.0–0.7)
Eosinophils Relative: 3 % (ref 0–5)
Lymphocytes Relative: 26 % (ref 12–46)
Neutrophils Relative %: 61 % (ref 43–77)

## 2010-03-30 LAB — CBC
Hemoglobin: 13.8 g/dL (ref 12.0–15.0)
MCH: 28.9 pg (ref 26.0–34.0)
MCHC: 33.2 g/dL (ref 30.0–36.0)
Platelets: 422 10*3/uL — ABNORMAL HIGH (ref 150–400)
RBC: 4.78 MIL/uL (ref 3.87–5.11)
RDW: 14.2 % (ref 11.5–15.5)

## 2010-03-30 LAB — COMPREHENSIVE METABOLIC PANEL
AST: 26 U/L (ref 0–37)
CO2: 31 mEq/L (ref 19–32)
Chloride: 100 mEq/L (ref 96–112)
Creatinine, Ser: 0.86 mg/dL (ref 0.4–1.2)
GFR calc Af Amer: >60 mL/min (ref >60)
GFR calc non Af Amer: >60 mL/min (ref >60)
Glucose, Bld: 105 mg/dL — ABNORMAL HIGH (ref 70–99)
Total Bilirubin: 0.5 mg/dL (ref 0.3–1.2)

## 2010-04-04 NOTE — Letter (Signed)
Summary: [facility]  [facility]   Imported By: Lind Guest 03/30/2010 10:26:33  _____________________________________________________________________  External Attachment:    Type:   Image     Comment:   External Document

## 2010-04-10 ENCOUNTER — Other Ambulatory Visit (HOSPITAL_COMMUNITY): Payer: Self-pay | Admitting: Oncology

## 2010-04-10 ENCOUNTER — Ambulatory Visit (HOSPITAL_COMMUNITY): Admission: RE | Admit: 2010-04-10 | Payer: Medicare Other | Source: Ambulatory Visit

## 2010-04-10 DIAGNOSIS — C50919 Malignant neoplasm of unspecified site of unspecified female breast: Secondary | ICD-10-CM

## 2010-04-19 ENCOUNTER — Ambulatory Visit (HOSPITAL_COMMUNITY)
Admission: RE | Admit: 2010-04-19 | Discharge: 2010-04-19 | Disposition: A | Discharge status: Home / Self Care | Payer: Medicare Other | Source: Ambulatory Visit | Attending: Oncology | Admitting: Oncology

## 2010-04-19 DIAGNOSIS — M818 Other osteoporosis without current pathological fracture: Secondary | ICD-10-CM | POA: Insufficient documentation

## 2010-04-19 DIAGNOSIS — C50919 Malignant neoplasm of unspecified site of unspecified female breast: Secondary | ICD-10-CM

## 2010-04-22 LAB — COMPREHENSIVE METABOLIC PANEL
ALT: 23 U/L (ref 0–35)
AST: 23 U/L (ref 0–37)
Albumin: 3.9 g/dL (ref 3.5–5.2)
Alkaline Phosphatase: 68 U/L (ref 39–117)
GFR calc Af Amer: >60 mL/min (ref >60)
Potassium: 4 mEq/L (ref 3.5–5.1)
Sodium: 139 mEq/L (ref 135–145)
Total Protein: 7.5 g/dL (ref 6.0–8.3)

## 2010-04-22 LAB — URINALYSIS, ROUTINE W REFLEX MICROSCOPIC
Ketones, ur: NEGATIVE mg/dL
Nitrite: NEGATIVE
Protein, ur: NEGATIVE mg/dL
pH: 5.5 (ref 5.0–8.0)

## 2010-04-22 LAB — CBC
Platelets: 387 10*3/uL (ref 150–400)
RDW: 13.7 % (ref 11.5–15.5)

## 2010-04-22 LAB — DIFFERENTIAL
Basophils Relative: 1 % (ref 0–1)
Eosinophils Absolute: 0.2 10*3/uL (ref 0.0–0.7)
Monocytes Absolute: 1 10*3/uL (ref 0.1–1.0)
Monocytes Relative: 9 % (ref 3–12)
Neutro Abs: 7 10*3/uL (ref 1.7–7.7)

## 2010-04-23 LAB — COMPREHENSIVE METABOLIC PANEL
ALT: 16 U/L (ref 0–35)
Calcium: 10.4 mg/dL (ref 8.4–10.5)
Glucose, Bld: 74 mg/dL (ref 70–99)
Sodium: 139 mEq/L (ref 135–145)
Total Protein: 7.6 g/dL (ref 6.0–8.3)

## 2010-04-23 LAB — CBC
Hemoglobin: 14.8 g/dL (ref 12.0–15.0)
MCHC: 34.3 g/dL (ref 30.0–36.0)
RDW: 14 % (ref 11.5–15.5)

## 2010-04-23 LAB — DIFFERENTIAL
Eosinophils Absolute: 0.3 10*3/uL (ref 0.0–0.7)
Lymphocytes Relative: 26 % (ref 12–46)
Lymphs Abs: 2.8 10*3/uL (ref 0.7–4.0)
Monocytes Relative: 11 % (ref 3–12)
Neutro Abs: 6.3 10*3/uL (ref 1.7–7.7)
Neutrophils Relative %: 59 % (ref 43–77)

## 2010-04-27 ENCOUNTER — Other Ambulatory Visit: Payer: Self-pay | Admitting: Family Medicine

## 2010-04-27 DIAGNOSIS — K5909 Other constipation: Secondary | ICD-10-CM

## 2010-05-01 LAB — URINE MICROSCOPIC-ADD ON

## 2010-05-01 LAB — URINALYSIS, ROUTINE W REFLEX MICROSCOPIC
Glucose, UA: NEGATIVE mg/dL
Specific Gravity, Urine: 1.02 (ref 1.005–1.030)
pH: 6 (ref 5.0–8.0)

## 2010-05-03 ENCOUNTER — Other Ambulatory Visit: Payer: Self-pay | Admitting: Family Medicine

## 2010-05-03 ENCOUNTER — Telehealth (INDEPENDENT_AMBULATORY_CARE_PROVIDER_SITE_OTHER): Payer: Medicare Other | Admitting: *Deleted

## 2010-05-03 DIAGNOSIS — J42 Unspecified chronic bronchitis: Secondary | ICD-10-CM

## 2010-05-04 LAB — HEPATIC FUNCTION PANEL
ALT: 15 U/L (ref 0–35)
AST: 18 U/L (ref 0–37)
Albumin: 4.7 g/dL (ref 3.5–5.2)
Alkaline Phosphatase: 75 U/L (ref 39–117)
Bilirubin, Direct: 0.1 mg/dL (ref 0.0–0.3)

## 2010-05-04 LAB — LIPID PANEL
Cholesterol: 169 mg/dL (ref 0–200)
HDL: 51 mg/dL (ref >39)
Total CHOL/HDL Ratio: 3.3 Ratio

## 2010-05-04 LAB — BASIC METABOLIC PANEL
Calcium: 10.4 mg/dL (ref 8.4–10.5)
Creat: 0.98 mg/dL (ref 0.40–1.20)
Sodium: 141 mEq/L (ref 135–145)

## 2010-05-04 LAB — TSH: TSH: 1.079 u[IU]/mL (ref 0.350–4.500)

## 2010-05-04 MED ORDER — BENZONATATE 100 MG PO CAPS: 100.0000 mg | ORAL_CAPSULE | Freq: Three times a day (TID) | ORAL | Status: DC | PRN

## 2010-05-04 MED ORDER — AZITHROMYCIN 250 MG PO TABS: 250.0000 mg | ORAL_TABLET | Freq: Every day | ORAL | Status: AC

## 2010-05-04 NOTE — Telephone Encounter (Signed)
pls advise and erx tessalon perles 100mg  one 3 times daily for 10 days and a zpack 250mg  x 1 only

## 2010-05-04 NOTE — Telephone Encounter (Signed)
meds sent and patient aware  

## 2010-05-12 ENCOUNTER — Encounter: Payer: Self-pay | Admitting: *Deleted

## 2010-05-12 ENCOUNTER — Telehealth: Payer: Self-pay | Admitting: Family Medicine

## 2010-05-12 NOTE — Telephone Encounter (Signed)
Sent as requested.

## 2010-05-19 ENCOUNTER — Telehealth: Payer: Self-pay | Admitting: Family Medicine

## 2010-05-19 ENCOUNTER — Other Ambulatory Visit: Payer: Self-pay

## 2010-05-19 DIAGNOSIS — K5909 Other constipation: Secondary | ICD-10-CM

## 2010-05-19 DIAGNOSIS — J40 Bronchitis, not specified as acute or chronic: Secondary | ICD-10-CM

## 2010-05-19 MED ORDER — IPRATROPIUM-ALBUTEROL 0.5-2.5 (3) MG/3ML IN SOLN: 3.0000 mL | Freq: Three times a day (TID) | RESPIRATORY_TRACT | Status: DC

## 2010-05-19 NOTE — Telephone Encounter (Signed)
The pump neb.

## 2010-05-19 NOTE — Telephone Encounter (Signed)
Neb machine sent with duoneb

## 2010-05-19 NOTE — Telephone Encounter (Signed)
Patient requesting nebulizer machine to be sent to [place] for her bronchitis. She has had one for years but it got broke when she moved

## 2010-05-19 NOTE — Telephone Encounter (Signed)
pls erx neb machine x 1 dx copd, and chronic bronchitis, also if she needs neb solutions pls erx duoneb 3 times daily as needed # 90 refill 1 Pls remind her I encourage her to stop smoking to reduce her cough and improve her breathing

## 2010-05-31 ENCOUNTER — Other Ambulatory Visit: Payer: Self-pay | Admitting: Family Medicine

## 2010-06-02 NOTE — Op Note (Signed)
[name], [name]                ACCOUNT NO.:  [id number]   MEDICAL RECORD NO.:  [id number]          PATIENT TYPE:  AMB   LOCATION:  DAY                           FACILITY:  [facility]   PHYSICIAN:  Dalia Heading, M.D.  DATE OF BIRTH:  03/17/1945   DATE OF PROCEDURE:  11/15/2004  DATE OF DISCHARGE:                                 OPERATIVE REPORT   PREOPERATIVE DIAGNOSIS:  Left breast carcinoma.   POSTOPERATIVE DIAGNOSIS:  Left breast carcinoma.   PROCEDURE:  Left partial mastectomy after needle localization, sentinel  lymph node biopsy, dye injection.   SURGEON:  Dalia Heading, M.D.   ANESTHESIA:  General endotracheal.   INDICATIONS:  The patient is a 65 year old white female who was found on  routine mammography to have a suspicious mass in the upper, outer quadrant  of the left breast. Core biopsy was done which revealed infiltrating ductal  carcinoma. The patient now comes to the operating for a left partial  mastectomy after needle localization, sentinel lymph node biopsy, and dye  injection. The risks and benefits of procedure including bleeding,  infection, pain, and the possibility of an axillary dissection were fully  explained to the patient, who gave informed consent.   PROCEDURE NOTE:  The patient was placed in supine position. After general  endotracheal anesthesia was administered, the area where the needle  localization had been performed in radiology was injected with blue dye.  This was then massaged into the left breast for approximately 5 minutes. The  left breast and axilla were then prepped and draped in the usual sterile  technique with Betadine. Surgical site confirmation was performed.   Initially, a sentinel lymph node biopsy was performed. Using a Neoprobe, a  lymph node was found high in the axilla. It was noted to be blue; this was  removed without difficulty. All the counts are listed on the patient's  chart. Basing counts were less than 10% of  the in vivo count. This was sent  to pathology and frozen section revealed this to have no evidence of  malignancy. No abnormal bleeding was noted. The subcutaneous layer was  reapproximated using a 3-0 Vicryl interrupted suture. The skin was closed  using a 4-0 Vicryl subcuticular suture. 0.5 cm Sensorcaine was instilled  into the surrounding wound. Dermabond was then applied.   Next, a left partial mastectomy was performed. A curvilinear incision was  made along the upper, outer portion of the left breast. This was carried  over medially. The guidewire was followed to its tip; and the left partial  mastectomy was performed. The tip of the wire was just overlying the chest  wall.  The specimen was removed and sent to radiology for specimen  radiography. The area of suspicion was contained within the specimen  removed. This was then sent to pathology for further examination. Any  bleeding was controlled using Bovie electrocautery. The wound was irrigated  with normal saline. The subcutaneous layer was reapproximated using a 3-0  Vicryl interrupted suture. The skin was closed using a 4-0 Vicryl  subcuticular suture. 0.5 cm  Sensorcaine was instilled into the surrounding  wound. Dermabond was then applied.   All tape and needle counts correct at the end of the procedure. The patient  was extubated in the operating room and went back to recovery room awake in  stable condition.   COMPLICATIONS:  None.   SPECIMEN:  1.  Sentinel lymph node.  2.  Left breast tissue.   BLOOD LOSS:  150 mL      Dalia Heading, M.D.  Electronically Signed     MAJ/MEDQ  D:  11/15/2004  T:  11/15/2004  Job:  [id number]   cc:   Madelin Rear. Sherwood Gambler, MD  Fax: [phone]

## 2010-06-02 NOTE — Discharge Summary (Signed)
[name], [name]                ACCOUNT NO.:  [id number]   MEDICAL RECORD NO.:  [id number]          PATIENT TYPE:  INP   LOCATION:  A321                          FACILITY:  [facility]   PHYSICIAN:  Dirk Dress. Katrinka Blazing, M.D.   DATE OF BIRTH:  August 25, 1945   DATE OF ADMISSION:  09/12/2004  DATE OF DISCHARGE:  09/07/2006LH                                 DISCHARGE SUMMARY   DISCHARGE DIAGNOSES:  1.  Diverticulitis status post Hartmann's procedure.  2.  Chronic bronchitis.  3.  Mild volume overload with pulmonary edema.  4.  Hypokalemia.  5.  Chronic obstructive lung disease.  6.  Head lice.   DISPOSITION:  The patient discharged in stable improved condition   DISCHARGE MEDICATIONS:  1.  Lortab 5/500 one to two every 4 hours as needed for pain.  2.  Reglan 10 mg before meals and bedtime.   SPECIAL PROCEDURES:  Colostomy closure August 29.   SUMMARY:  A 65 year old female with history of acute diverticulitis with  pelvic abscess dating back to September 2005. She had sepsis and underwent a  Hartmann's procedure. She did well over the interim and is scheduled for  colostomy closure. She had a bowel prep postop as an outpatient and was  admitted for colostomy closure. This was done through day surgery on the  29th. She had an uneventful procedure. She did rather well during her  postoperative period except for development of volume overload with  interstitial edema. This was treated with IV Lasix, and she diuresed  quickly. She had hypokalemia because of Lasix therapy. This was replaced  with IV potassium. She had good return of intestinal function and started  having good bowel movements on September 4. Diet was advanced on the 5th.  She had no other problems except she was noticed to have head lice which  were confirmed by pathology. These were treated prior to discharge. She was  discharged in satisfactory condition on September 7.      Dirk Dress. Katrinka Blazing, M.D.  Electronically  Signed     LCS/MEDQ  D:  12/03/2004  T:  12/04/2004  Job:  [id number]

## 2010-06-02 NOTE — Group Therapy Note (Signed)
[name], [name]                ACCOUNT NO.:  [id number]   MEDICAL RECORD NO.:  [id number]          PATIENT TYPE:  INP   LOCATION:  A341                          FACILITY:  [facility]   PHYSICIAN:  Dirk Dress. Katrinka Blazing, M.D.   DATE OF BIRTH:  09/01/1945   DATE OF PROCEDURE:  DATE OF DISCHARGE:                                   PROGRESS NOTE   SUBJECTIVE:  [name] continues to improve.  She denies nausea.  She has not  had flatus.  She has tolerated liquids with the nasogastric tube clamped.  She has no respiratory difficulty.  There has been no gas through her  stomach.   OBJECTIVE:  Blood pressure 132/73, pulse 85, respirations 18, temperature  98.1.  O2 saturations 97% on two liters nasal cannula.  Neck supple.  No JVD  or bruits.  Chest, a few rhonchi, but much clearer than on yesterday, no  wheezes.  Heart, regular rate and rhythm, no murmur, gallop or rub.  Abdomen  soft, nontender.  Good active bowel sounds.  Stoma looks good.  JP drain is  just serous.  Extremities, no edema or tenderness.   ASSESSMENT:  Impression:  Stable status post Hartmann procedure with slow  return of bowel function.   PLAN:  Nasogastric tube will be discontinued.  She will be started on clear  liquids.  She will receive Milk of Magnesia, and we will monitor her  progress.      LCS/MEDQ  D:  10/10/2003  T:  10/10/2003  Job:  [id number]

## 2010-06-02 NOTE — Discharge Summary (Signed)
NAMEJANINA, [name]                ACCOUNT NO.:  [id number]   MEDICAL RECORD NO.:  [id number]          PATIENT TYPE:  INP   LOCATION:  A341                          FACILITY:  [facility]   PHYSICIAN:  Dirk Dress. Katrinka Blazing, M.D.   DATE OF BIRTH:  08/01/1945   DATE OF ADMISSION:  10/02/2003  DATE OF DISCHARGE:  09/28/2005LH                                 DISCHARGE SUMMARY   DISCHARGE DIAGNOSES:  1.  Acute diverticulitis with pelvic abscess.  2.  Hypertension.  3.  Hypokalemia.  4.  Osteoarthritis.  5.  History of kidney stones.   SPECIAL PROCEDURE:  Rectosigmoid resection with Hartmann pouch and end  colostomy.   DISPOSITION:  The patient is discharged home in stable, satisfactory  condition.   DISCHARGE MEDICATIONS:  1.  Keflex 500 mg q.i.d.  2.  Levaquin 500 mg daily.   DISCHARGE INSTRUCTIONS:  1.  She will have Home Health care for her wound and colostomy care.  2.  She will have dressing changes every other day, and clean the area of      drainage with saline and Betadine and cover with 4x4 dressings and tape.  3.  She will also need colostomy care.   SUMMARY:  A 65 year old female admitted with severe lower abdominal pain  with findings of diverticulitis.  She had a three-week history of severe,  progressive lower abdominal pain which was localized primary in the left  lower quadrant and suprapubic area.  She denied nausea or vomiting.  She  developed pain with movement.  She had episodic diarrhea.  When seen in the  emergency room, she was noted to have a mass in her left lower quadrant.  CT  confirmed diverticulitis with abscess.  The patient was admitted for  antibiotic therapy.  She was informed that if she improved on antibiotic  treatment, that we would continue to treat her nonoperatively, but if she  worsened, we will proceed with operative therapy.  For the first two days,  the patient felt better, but on the 19th, her white count increased to  23,000.  The patient had  increase in the size of the mass in her left lower  quadrant.  Followup CT showed worsening progression of her inflammation, and  abscess.  She was taken to the operating room on September 20, where  rectosigmoid resection was carried out with end colostomy and Hartmann  pouch.  The pelvis was drained with two JP drains.   The patient had an uneventful postoperative course.  Her temperature  returned to normal.  White count gradually decreased.  She did not have any  electrolyte problems.  By the fourth postoperative day, she felt much  better.  NG tube was clamped, and she was given clear liquids.  By the fifth  postoperative day, she had multiple bowel movements through her stoma.  Her  diet was advanced.  White count was 12,000 at that time. The electrolytes  remained normal.  She remained stable from that point on.   DISCHARGE CONDITION:  She was discharged on the 28th in stable, satisfactory  condition, with plans to follow up with Home Health care as noted above.     Lero   LCS/MEDQ  D:  11/27/2003  T:  11/27/2003  Job:  [id number]

## 2010-06-02 NOTE — Op Note (Signed)
[name], [name]                ACCOUNT NO.:  [id number]   MEDICAL RECORD NO.:  [id number]          PATIENT TYPE:  INP   LOCATION:  A310                          FACILITY:  [facility]   PHYSICIAN:  Dirk Dress. Katrinka Blazing, M.D.   DATE OF BIRTH:  [birth year]   DATE OF PROCEDURE:  09/12/2004  DATE OF DISCHARGE:                                 OPERATIVE REPORT   PREOPERATIVE DIAGNOSIS:  Diverticulitis, status post Hartmann procedure.   POSTOPERATIVE DIAGNOSIS:  Diverticulitis, status post Hartmann procedure.   PROCEDURE:  Colostomy closure.   SURGEON:  Dirk Dress. Katrinka Blazing, M.D.   DESCRIPTION:  Under general anesthesia, the patient's abdomen was prepped  and draped in the sterile field.  The stoma was sutured closed with a  running 2-0 chromic.  Ioban dressing was placed to isolate the stoma.  A  midline incision was made.  There were extensive adhesions of the omentum to  the anterior abdominal wall.  These were taken down sharply.  There were  also extensive adhesions of the small bowel to the omentum and to the  surrounding structures.  These were taken down sharply.  Once this was done,  I was able to place the Bookwalter retractor.  With the Bookwalter retractor  placed, I could then dissect the pelvis.  The rectal stump was identified  from the large sutures that were left from the previous operation.  It was  dissected until it was clear.  There was no evidence of significant  inflammation at this time.  There was no residual purulence.  Once I was  sure that this would be adequate for closure, the descending colon was  dissected.  It was then transected close to the abdominal wall.  The splenic  flexure was further dissected in order to gain more length.  The colon was  then placed in the left gutter and positioned down in the pelvis.  An end-to-  side hand sewn anastomosis was effected without difficulty.  This was done  with 2-0 Monocryl and 3-0 Prolene.  The colon was then sutured to the  lateral gutter to prevent herniation.  Irrigation was carried out.  The  nasogastric tube was positioned in the stomach.  The old stoma site was  excised.  The peritoneum and fascia were closed with interrupted 0 Prolene.  Irrigation of the stoma site was then carried out.  The fascia was closed in  the midline using running #1 Prolene.  Subcutaneous tissue was irrigated and  then closed with 2-0 Monocryl.  The skin was closed with staples.  The  anterior rectus fascia was closed with 0 Prolene in this stoma site.  Subcutaneous tissue was closed with 3-0 Monocryl.  Skin was closed with  staples.  The patient tolerate  the procedure well.  Then 4x4 dressings and tape were placed.  She was  awakened from anesthesia uneventfully.  Sponge and needle count were  verified as correct x3.  The patient was transferred to a bed and taken to  the post anesthesia care unit for further monitoring.      Dirk Dress.  Katrinka Blazing, M.D.  Electronically Signed     LCS/MEDQ  D:  09/12/2004  T:  09/12/2004  Job:  [id number]   cc:   [facility]  Attn:  [facility]

## 2010-06-02 NOTE — H&P (Signed)
NAMEKYLEEANN, [name]                ACCOUNT NO.:  [id number]   MEDICAL RECORD NO.:  [id number]          PATIENT TYPE:  INP   LOCATION:  A310                          FACILITY:  [facility]   PHYSICIAN:  Dirk Dress. Katrinka Blazing, M.D.   DATE OF BIRTH:  01/20/1945   DATE OF ADMISSION:  DATE OF DISCHARGE:  LH                                HISTORY & PHYSICAL   REASON FOR ADMISSION:  A 65 year old white female with history of acute  diverticulitis with pelvic abscess dating back to September 2005.  The  patient presented with sepsis and underwent Hartmann's procedure.  She has  done well over the interim.  She is now scheduled to have colostomy closure.   PAST MEDICAL HISTORY:  1.  Chronic bronchitis.  2.  Hypertension.  3.  Osteoarthritis.  4.  Depression.   PAST SURGICAL HISTORY:  1.  Total abdominal hysterectomy.  2.  Left breast biopsy.  3.  Hartmann's procedure.   MEDICATIONS:  Hydrochlorothiazide and Lortab 5/500.   PHYSICAL EXAMINATION:  VITAL SIGNS:  Blood pressure 120/74, pulse 92,  respirations 20. Weight 161 pounds.  HEENT:  Unremarkable.  NECK:  Supple.  No JVD, bruits, adenopathy, or thyromegaly.  CHEST:  Coarse rhonchi, diffuse wheezes and a few scattered rales.  HEART:  Regular rate and rhythm without murmur, gallop or rub.  ABDOMEN:  Well-healed midline incision.  Functioning stoma on the left lower  quadrant.  RECTAL:  Mild anal stenosis without fissure.  Small amount of mucus in the  rectum.  EXTREMITIES:  No cyanosis, clubbing or edema.  NEUROLOGIC:  No focal motor, sensory or cerebellar deficits.   IMPRESSION:  1.  History of diverticulitis, status post Hartmann's procedure with      temporary colostomy.  2.  Chronic bronchitis.  3.  Hypertension.  4.  Osteoarthritis.  5.  Depression.   PLAN:  Exploratory laparotomy with colostomy closure.      Dirk Dress. Katrinka Blazing, M.D.  Electronically Signed     LCS/MEDQ  D:  09/11/2004  T:  09/12/2004  Job:  [id number]

## 2010-06-02 NOTE — Group Therapy Note (Signed)
NAMEJESSIKA, [name]                ACCOUNT NO.:  [id number]   MEDICAL RECORD NO.:  [id number]          PATIENT TYPE:  INP   LOCATION:  A341                          FACILITY:  [facility]   PHYSICIAN:  Dirk Dress. Katrinka Blazing, M.D.   DATE OF BIRTH:  11/06/1945   DATE OF PROCEDURE:  DATE OF DISCHARGE:                                   PROGRESS NOTE   SUBJECTIVE:  [name] continues to do well.  She is tolerating a regular  diet.  She is having good bowel movements.  Nasogastric tube has been  removed.  She is having excellent flow through her stoma.  She does not have  pain.   OBJECTIVE:  VITAL SIGNS:  On examination, blood pressure is 138/68, pulse  106, respirations 18, temperature 97.7.  Oxygen saturation is 94% on room  air.  NECK:  Supple.  LUNGS:  Clear.  HEART:  Regular rate and rhythm, no murmur or gallop.  ABDOMEN:  Soft, nontender.  Her stoma looks good.  Colostomy appliance is in  place.  There is a small amount of drainage in her midline incision.  JP  drain is small and is serous.  EXTREMITIES:  No edema.  NEUROLOGIC:  Nonfocal.   ASSESSMENT:  Stable, status post Hartmann procedure, with excellent return  of intestinal function.  Probable early superficial wound induration, but no  evidence of overt infection.   PLAN:  1.  The patient is discharged home.  2.  We will arrange Home Health.  3.  She will receive Keflex 500 mg q.i.d. and Levaquin 750 mg daily for her      indurated wound.  4.  JP drains with be discontinued before she leaves.  5.  We will have Home Health to follow her up for wound care and colostomy      teaching.  6.  I will see her back in the office in two weeks.      LCS/MEDQ  D:  10/13/2003  T:  10/13/2003  Job:  [id number]

## 2010-06-02 NOTE — Op Note (Signed)
[name], [name]                ACCOUNT NO.:  [id number]   MEDICAL RECORD NO.:  [id number]          PATIENT TYPE:  INP   LOCATION:  A336                          FACILITY:  [facility]   PHYSICIAN:  Dirk Dress. Katrinka Blazing, M.D.   DATE OF BIRTH:  02/27/1945   DATE OF PROCEDURE:  10/05/2003  DATE OF DISCHARGE:                                 OPERATIVE REPORT   PREOPERATIVE DIAGNOSIS:  Acute diverticulitis with pelvic abscess.   POSTOPERATIVE DIAGNOSIS:  Acute diverticulitis with pelvic abscess.   PROCEDURE:  Hartmann's procedure with end colostomy.   SURGEON:  Dirk Dress. Katrinka Blazing, M.D.   DESCRIPTION OF PROCEDURE:  Under general anesthesia the patient's abdomen  was prepped and draped in a sterile field.  A midline incision was made.  Upon entering the abdomen there was no free fluid.  There was a large mass  in the left lower quadrant which encompassed the sigmoid colon and proximal  rectum and portion of the distal descending colon.  The upper abdomen was  unremarkable.  The gallbladder was distended, but no stones could be  palpated.  The liver appeared to be unremarkable.  Mesentery was normal.  The rest of the colon appeared to be normal.   The abdomen was packed off and the Bookwalter retractor was placed.  The  sigmoid was mobilized.  While mobilizing the medial aspect of the sigmoid a  large abscess cavity was entered.  Cultures were taken for aerobic and  anaerobic cultures.  The sigmoid was then further mobilized until the  mesentery could be reached. Dissection was carried off deep into the pelvis  separating the sigmoid and rectum from the bladder.  All of this dissection  was done using blunt dissection.  No sharp dissection was carried out.  While dissecting the bladder from the colon another large abscess cavity was  entered.  This was suctioned and irrigated. The sigmoid was finally freed  up.  Dissection was continued down into the pelvis below the peritoneum.  Below the peritoneum  there was bowel that appeared not to be involved.  This  was chosen as the point of transection.  The mesentery to this segment was  doubly clamped with Kelly clamps, divided and tied with ligatures of 3-0  silk.  The colon was transected using a reticulating TA-60 stapler.  The  proximal rectum was transected.  The mesentery to the remaining portion of  the bowel was thick and close to the bowel so as to prevent injury to the  ureter.  The ureter was bound, running tightly against the peritoneum at the  edge of the inflammatory process.  It was followed throughout its course  into the deep pelvis and was not injured.   Next, the descending colon was mobilized up to and proximal to the splenic  flexure.  The descending colon was transected using an endo GIA at about mid  descending colon. This specimen was passed off.  Further irrigation of the  area was carried out.  Inspection was carried out and no other inflammation  was noted.  The rectal stump was  oversewn using 3-0 Prolene.  Three long  ends of the Prolene was left intact for future visualization.   Next, 2 JP drains were placed in the deep pelvis and the other was placed in  the left gutter.  These were secured with 3-0 nylon.  A stoma was fashioned  in the left mid abdomen halfway between the umbilicus and the anterior  superior iliac spine.  The end of the descending colon was brought out  through this opening without difficulty.  There was no tension.  It had  excellent blood supply.  The wall of the bowel was sutured to the peritoneum  using interrupted silk.  The free edge of the mesentery was sutured to the  peritoneum to prevent herniation.  Once this was done, sponge, needle,  instrument and blade counts were verified as correct.  Further irrigation of  the pelvis and subcutaneous tissue was carried out.  The abdomen was closed  using running #1 Prolene.  Subcutaneous tissue was irrigated, again, and  closed with 2-0  Monocryl and 3-0 Monocryl.  Staples were used on the skin; 4  x 4s and OpSite were used to cover the incision.  The stoma was then  matured.  The wall of the bowel was sutured to the fascia using interrupted  3-0 silk.  The edge of the bowel was then sutured to the dermis using  running 4-0 Vicryl.  The patient tolerated the procedure well.  Stoma plast  was placed.  She was awakened from anesthesia, transferred to a bed and  taken to the postanesthetic care unit for further monitoring.      LCS/MEDQ  D:  10/05/2003  T:  10/05/2003  Job:  [id number]   cc:   [facility] Department

## 2010-06-02 NOTE — Discharge Summary (Signed)
NAMEDANDREA, MEDDERS                ACCOUNT NO.:  [id number]   MEDICAL RECORD NO.:  [id number]          PATIENT TYPE:  INP   LOCATION:  A321                          FACILITY:  [facility]   PHYSICIAN:  Dirk Dress. Katrinka Blazing, M.D.   DATE OF BIRTH:  25-Jan-1945   DATE OF ADMISSION:  09/21/2004  DATE OF DISCHARGE:  09/11/2006LH                                 DISCHARGE SUMMARY   DISCHARGE DIAGNOSES:  1.  Postoperative ileus.  2.  Chronic obstructive lung disease.  3.  History of diverticulosis, status post Hartmann's procedure, status post      colostomy closure, stable.   DISPOSITION:  The patient discharged home in stable, satisfactory condition.   DISCHARGE MEDICATIONS:  1.  Zelnorm 6 mg twice daily.  2.  Reglan 10 mg before meals and at bedtime.  3.  Milk of Magnesia 30 mL once daily.   HISTORY OF PRESENT ILLNESS:  This is a 65 year old female who underwent  colostomy closure on August 29.  She had a relatively uneventful  postoperative course and was discharged on September 7, in satisfactory  condition.  She returned on the evening of discharge complaining of  abdominal pain, nausea, vomiting.  The patient was readmitted.   HOSPITAL COURSE:  She refused nasogastric tube in spite of emesis.  She was  noted to have a large volume of flatus during that evening.  X-rays  suggested postoperative ileus without evidence of obstruction.  By September  9, she was feeling much better.  She had regular bowel movements with more  flatus.  She remained afebrile.  Abdomen remained benign. Abdominal series  showed dilated colon with gas extending down to the rectum with a few air  fluid levels in the colon.  Diet was advanced without difficulty.  By  September 11, she was stable.  She was tolerating a diet, having regular  bowel movements and she was discharged home in satisfactory condition.      Dirk Dress. Katrinka Blazing, M.D.  Electronically Signed     LCS/MEDQ  D:  12/03/2004  T:  12/04/2004  Job:   [id number]

## 2010-06-02 NOTE — H&P (Signed)
NAME:  [name], [name]                          ACCOUNT NO.:  [id number]   MEDICAL RECORD NO.:  [id number]                   PATIENT TYPE:  INP   LOCATION:  A336                                 FACILITY:  [facility]   PHYSICIAN:  Dirk Dress. Katrinka Blazing, M.D.                DATE OF BIRTH:  11/24/1945   DATE OF ADMISSION:  10/02/2003  DATE OF DISCHARGE:                                HISTORY & PHYSICAL   This is a 65 year old female admitted with severe lower abdominal pain with  findings of diverticulitis.  The patient gives a 2-3 week history of severe  progressive lower abdominal pain which has been localized primarily in the  left lower quadrant and suprapubic area.  She has had no nausea or vomiting.  She has developed pain with any movement.  She has had occasional episodes  of diarrhea.  Because of continued severe pain, she came to the emergency  room where on evaluation she was noted to have a mass in the left lower  quadrant.  Mass noted on abdominal series in the left lower quadrant and CT  evidence of sigmoid diverticulitis with abscess.  She is admitted for  antibiotic treatment.  It is highly possible that she will need operative  therapy with excision and followup colostomy.   PAST HISTORY:  1.  Hypertension.  2.  Osteoarthritis.  3.  Kidney stones.   She is followed primarily at the [facility] Department.   SURGERY:  1.  Left breast biopsy x 2.  2.  Total abdominal hysterectomy.   ALLERGIES:  1.  SULFA.  2.  PENICILLIN.  .   OTHER HOSPITALIZATIONS:  None, except for surgery.   SOCIAL HISTORY:  She is unemployed, separated from her husband x 10 years.  She lives with a friend.  She has four grown children.  She smokes a pack of  cigarettes per day.  She does not drink or use drugs.  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  She presently receives food stamps.   REVIEW OF SYSTEMS:  Positive for depression and diffuse joint pain.   MEDICATIONS:   Motrin, hydrochlorothiazide, and Benadryl.   PHYSICAL EXAMINATION:  GENERAL:  She is a pleasant female who appears mildly  distressed but mostly emotional.  VITAL SIGNS:  Blood pressure is 130/70, pulse 110, respirations 18,  temperature 98.9, O2 saturation 98% on room air.  HEENT:  Unremarkable, except for dentition.  She has full upper dentures  with four teeth in the lower anterior incisors.  These are in poor repair.  Gums appear to be okay.  Her oral mucosa is otherwise unremarkable.  NECK:  Supple.  No JVD, adenopathy, thyromegaly, or jugular venous  distention.  CHEST:  Significantly decreased breath sounds throughout, even with maximum  effort.  No rales, rubs, rhonchi, or wheezes.  HEART:  Resting tachycardia, normal S1 and S2.  No murmur or gallop.  ABDOMEN:  Mildly distended with very prominent easily palpable mass in the  left lower quadrant extending into the suprapubic region.  She has moderate  increased bowel activity in the upper abdomen but she has a very quiet lower  abdomen.  EXTREMITIES:  No cyanosis, clubbing, or edema.  She has good femoral pulses  and easily palpable popliteal pulses but her dorsalis pedis and posterior  tibial pulses are weak bilaterally.  NEUROLOGIC:  She is alert and oriented x 3.  She is mildly depressed.  Cranial nerves are intact.  There is no motor sensory or cerebellar deficit.  Deep tendon reflexes are symmetric and brisk.   IMPRESSION:  1.  Acute diverticulitis with pelvic abscess.  2.  Hypertension.  3.  Hypokalemia.  4.  Osteoarthritis.  5.  History of kidney stones.   PLAN:  The patient will be treated aggressively with IV antibiotics.  She  will receive triple drug therapy.  She will receive IV and p.o. potassium  for potassium replacement therapy.  We will follow her vital signs closely.  If she continues to improve, we will try to treat her non operatively.  If  she should worsen, she will need to have operative therapy  which will  undoubtedly be a primary resection with colostomy, Hartmann pouch, and  followup colostomy closure in 3-4 months.      LCS/MEDQ  D:  10/02/2003  T:  10/02/2003  Job:  [id number]

## 2010-06-02 NOTE — H&P (Signed)
NAME:  [name], [name]                ACCOUNT NO.:  [id number]   MEDICAL RECORD NO.:  [id number]          PATIENT TYPE:  AMB   LOCATION:  DAY                           FACILITY:  [facility]   PHYSICIAN:  Dalia Heading, M.D.  DATE OF BIRTH:  1945-05-17   DATE OF ADMISSION:  DATE OF DISCHARGE:  LH                                HISTORY & PHYSICAL   CHIEF COMPLAINT:  Infiltrating ductal carcinoma, left breast.   HISTORY OF PRESENT ILLNESS:  The patient is a 65 year old white female who  was found on routine mammography to have a suspicious mass in the upper  outer quadrant of the left breast. A core biopsy was done which revealed  infiltrating ductal carcinoma. The lesion measured approximately 6 mm in its  greatest diameter. The patient now comes to the operating room for a left  partial mastectomy after ultrasound guided needle localization, sentinel  lymph node biopsy, and possible axillary dissection. The patient denies any  family history of breast carcinoma.   PAST MEDICAL HISTORY:  1.  Diverticulitis.  2.  Hypertension.   PAST SURGICAL HISTORY:  1.  Partial colectomy with colostomy.  2.  Recent colostomy reversal.   CURRENT MEDICATIONS:  Hydrochlorothiazide, hydrocodone.   ALLERGIES:  PENICILLIN, SULFA.   REVIEW OF SYSTEMS:  The patient smokes a pack of cigarettes a day. She  denies any alcohol use.   PHYSICAL EXAMINATION:  GENERAL:  The patient is a well-developed, well-  nourished, white female in no acute distress.  NECK:  Supple without lymphadenopathy.  LUNGS:  Clear to auscultation with equal breath sounds bilaterally.  HEART:  Reveals a regular rate and rhythm without S3, S4 or murmurs.  BREASTS:  Right breast examination reveals no dominant mass, nipple  discharge or dimpling. The axilla is negative for palpable nodes. Left  breast examination reveals no dominant mass, nipple discharge or dimpling.  The axilla is negative for palpable nodes.   IMPRESSION:   Infiltrating ductal carcinoma, left breast.   PLAN:  The patient is scheduled for left partial mastectomy after ultrasound-  guided needle localization, sentinel lymph node biopsy, and possible  axillary dissection on November 15, 2004. The risks and benefits of the  procedure including bleeding and infection were fully explained to the  patient who gave informed consent.      Dalia Heading, M.D.  Electronically Signed     MAJ/MEDQ  D:  11/02/2004  T:  11/02/2004  Job:  [id number]   cc:   [facility]  Fax: [phone]   Madelin Rear. Sherwood Gambler, MD  Fax: [phone]

## 2010-06-02 NOTE — H&P (Signed)
NAME:  [name], [name]                ACCOUNT NO.:  [id number]   MEDICAL RECORD NO.:  [id number]          PATIENT TYPE:  AMB   LOCATION:  DAY                           FACILITY:  [facility]   PHYSICIAN:  Dalia Heading, M.D.  DATE OF BIRTH:  11/27/1945   DATE OF ADMISSION:  DATE OF DISCHARGE:  LH                              HISTORY & PHYSICAL   CHIEF COMPLAINT:  Hematochezia.   HISTORY OF PRESENT ILLNESS:  The patient is a 65 year old white female  who is referred for endoscopic evaluation.  She needs a colonoscopy for  hematochezia.  No abdominal pain, weight loss, nausea, vomiting,  diarrhea, constipation, or melena have been noted.  She is status post a  Hartmann's procedure with colostomy closure in 2005 and 2006 by Dr.  Katrinka Blazing for diverticulitis.  No family history of colon carcinoma is  noted.   PAST MEDICAL HISTORY:  As noted above, hypertension.   PAST SURGICAL HISTORY:  As noted above, left partial mastectomy in 2006  with sentinel lymph node biopsy.   CURRENT MEDICATIONS:  Unknown chemotherapeutic agent and Vicodin.   ALLERGIES:  PENICILLIN and SULFA.   REVIEW OF SYSTEMS:  The patient smokes a pack of cigarettes a day.  She  denies alcohol use.   PHYSICAL EXAMINATION:  GENERAL APPEARANCE:  The patient is a well-  developed, well-nourished white female in no acute distress.  LUNGS:  Clear to auscultation with equal breath sounds bilaterally.  HEART:  Regular rate and rhythm without S3, S4, or murmurs.  ABDOMEN:  Soft, nontender, nondistended.  No hepatosplenomegaly or  masses noted.  RECTAL:  Examination was deferred to the procedure.   IMPRESSION:  Hematochezia.   PLAN:  The patient is scheduled for colonoscopy on January 01, 2006.  The risks and benefits of the procedure including bleeding and  perforation were fully explained to the patient, gave informed consent.      Dalia Heading, M.D.  Electronically Signed    MAJ/MEDQ  D:  12/13/2005  T:  12/14/2005   Job:  [id number]

## 2010-06-06 ENCOUNTER — Other Ambulatory Visit: Payer: Self-pay | Admitting: Family Medicine

## 2010-06-07 ENCOUNTER — Other Ambulatory Visit: Payer: Self-pay | Admitting: Family Medicine

## 2010-06-08 ENCOUNTER — Encounter: Payer: Self-pay | Admitting: Family Medicine

## 2010-06-13 ENCOUNTER — Ambulatory Visit (INDEPENDENT_AMBULATORY_CARE_PROVIDER_SITE_OTHER): Payer: Medicare Other | Admitting: Family Medicine

## 2010-06-13 ENCOUNTER — Encounter: Payer: Self-pay | Admitting: Family Medicine

## 2010-06-13 VITALS — BP 124/80 | HR 95 | Resp 16 | Wt 177.4 lb

## 2010-06-13 DIAGNOSIS — J4489 Other specified chronic obstructive pulmonary disease: Secondary | ICD-10-CM

## 2010-06-13 DIAGNOSIS — Z23 Encounter for immunization: Secondary | ICD-10-CM

## 2010-06-13 DIAGNOSIS — J209 Acute bronchitis, unspecified: Secondary | ICD-10-CM

## 2010-06-13 DIAGNOSIS — I10 Essential (primary) hypertension: Secondary | ICD-10-CM

## 2010-06-13 DIAGNOSIS — J449 Chronic obstructive pulmonary disease, unspecified: Secondary | ICD-10-CM

## 2010-06-13 DIAGNOSIS — J309 Allergic rhinitis, unspecified: Secondary | ICD-10-CM

## 2010-06-13 DIAGNOSIS — F17200 Nicotine dependence, unspecified, uncomplicated: Secondary | ICD-10-CM

## 2010-06-13 DIAGNOSIS — R51 Headache: Secondary | ICD-10-CM

## 2010-06-13 DIAGNOSIS — Z1211 Encounter for screening for malignant neoplasm of colon: Secondary | ICD-10-CM

## 2010-06-13 DIAGNOSIS — E785 Hyperlipidemia, unspecified: Secondary | ICD-10-CM

## 2010-06-13 DIAGNOSIS — M159 Polyosteoarthritis, unspecified: Secondary | ICD-10-CM

## 2010-06-13 MED ORDER — KETOROLAC TROMETHAMINE 60 MG/2ML IM SOLN
60.0000 mg | Freq: Once | INTRAMUSCULAR | Status: AC
  Administered 2010-06-13: 60 mg via INTRAMUSCULAR

## 2010-06-13 MED ORDER — LORATADINE 10 MG PO TABS
10.0000 mg | ORAL_TABLET | Freq: Every day | ORAL | Status: DC
Start: 2010-06-13 — End: 2010-12-14

## 2010-06-13 MED ORDER — CEPHALEXIN 500 MG PO CAPS: 500.0000 mg | ORAL_CAPSULE | Freq: Four times a day (QID) | ORAL | Status: AC

## 2010-06-13 MED ORDER — BENAZEPRIL-HYDROCHLOROTHIAZIDE 20-12.5 MG PO TABS: 1.0000 | ORAL_TABLET | Freq: Every day | ORAL | Status: DC

## 2010-06-13 MED ORDER — POTASSIUM CHLORIDE 20 MEQ PO PACK: 20.0000 meq | PACK | Freq: Every day | ORAL | Status: DC

## 2010-06-13 NOTE — Patient Instructions (Addendum)
F/u in 4 months.  bloodwork in April was excellent, no medication changes at this time  You will be referred to Dr Lovell Sheehan for a colonscopy, screening  pls cut back on smoking  , you need  To quit  Medication sent in for cOPd exaccerbation  No evidence of infection on your leg

## 2010-06-13 NOTE — Assessment & Plan Note (Signed)
Deteriorated, will send in rx for keflex

## 2010-06-14 NOTE — Assessment & Plan Note (Signed)
Controlled, no change in medication  

## 2010-06-14 NOTE — Assessment & Plan Note (Signed)
Antibiotics prescribed , pt encouraged to quit smoking , no commitment at this time

## 2010-06-14 NOTE — Assessment & Plan Note (Signed)
Deteriorated, toradol administered in office

## 2010-06-14 NOTE — Progress Notes (Signed)
  Subjective:    Patient ID: [name], female    DOB: 08/06/1945, 65 y.o.   MRN: [id number]  HPI The PT is here for follow up and re-evaluation of chronic medical conditions, medication management and review of recent lab and radiology data.  Preventive health is updated, specifically  Cancer screening, and Immunization.   Questions or concerns regarding consultations or procedures which the PT has had in the interim are  addressed. The PT denies any adverse reactions to current medications since the last visit.  She unfortunately lost her 45 y/o daughter in a drug o/d in April and has 2 children living from 5 . She appears to be coping well with this She c/o increased right leg pain following a recent fall and is convinced that thwere is an infected "knot" on the anterior aspect.Reports overall increased generalized joint pains and requests injection for this  Also continues to smoke , with no reduction in # of cigarretes and has recently had increased chest congestion with yellow sputum in the past week.    Review of Systems See HPI Denies recent fever  Denies sinus pressure, nasal congestion, ear pain or sore throat.  Denies chest pains, palpitations, paroxysmal nocturnal dyspnea, orthopnea and leg swelling Denies abdominal pain, nausea, vomiting,diarrhea or constipation.  Denies rectal bleeding or change in bowel movement. Denies dysuria, frequency, hesitancy or incontinence. Denies headaches, seizure, numbness, or tingling.  Denies skin break down or rash.        Objective:   Physical Exam Patient alert and oriented and in no Cardiopulmonary distress.  HEENT: No facial asymmetry, EOMI, no sinus tenderness, TM's clear, Oropharynx pink and moist.  Neck supple no adenopathy.  Chest: decreased air entry, bilateral crackles, no wheezes  CVS: S1, S2 no murmurs, no S3.  ABD: Soft non tender. Bowel sounds normal.  Ext: No edema  WU:[id number] ROM spine, adequate in  shoulders, hips and knees.Tender over right anterior leg, no erythema or skin breakdown, no palpable nodule  Skin: Intact, no ulcerations or rash noted.  Psych: Good eye contact, normal affect. Memory intact not anxious or depressed appearing.  CNS: CN 2-12 intact, power, tone and sensation normal throughout.        Assessment & Plan:

## 2010-06-14 NOTE — Assessment & Plan Note (Signed)
Unchanged, counseled to quit 

## 2010-07-11 ENCOUNTER — Ambulatory Visit (HOSPITAL_COMMUNITY)
Admission: RE | Admit: 2010-07-11 | Discharge: 2010-07-11 | Disposition: A | Discharge status: Home / Self Care | Payer: Medicare Other | Source: Ambulatory Visit | Attending: General Surgery | Admitting: General Surgery

## 2010-07-11 ENCOUNTER — Other Ambulatory Visit: Payer: Self-pay | Admitting: Family Medicine

## 2010-07-11 DIAGNOSIS — I10 Essential (primary) hypertension: Secondary | ICD-10-CM | POA: Insufficient documentation

## 2010-07-11 DIAGNOSIS — Z79899 Other long term (current) drug therapy: Secondary | ICD-10-CM | POA: Insufficient documentation

## 2010-07-11 DIAGNOSIS — E785 Hyperlipidemia, unspecified: Secondary | ICD-10-CM | POA: Insufficient documentation

## 2010-07-11 DIAGNOSIS — Z1211 Encounter for screening for malignant neoplasm of colon: Secondary | ICD-10-CM | POA: Insufficient documentation

## 2010-07-11 DIAGNOSIS — Z9049 Acquired absence of other specified parts of digestive tract: Secondary | ICD-10-CM | POA: Insufficient documentation

## 2010-07-12 NOTE — Op Note (Signed)
  NAMEKARLE, DESROSIER NO.:  [id number]  MEDICAL RECORD NO.:  [id number]  LOCATION:  DAYP                          FACILITY:  [facility]  PHYSICIAN:  Dalia Heading, M.D.  DATE OF BIRTH:  06/27/1945  DATE OF PROCEDURE:  07/11/2010 DATE OF DISCHARGE:                              OPERATIVE REPORT   PREOPERATIVE DIAGNOSES:  Need for screening colonoscopy, history of constipation.  POSTOPERATIVE DIAGNOSES:  Need for screening colonoscopy, history of constipation.  PROCEDURE:  Colonoscopy.  SURGEON:  Dalia Heading, MD.  ANESTHESIA:  Demerol 15 mg IV, Versed 4 mg IV.  INDICATIONS:  The patient is a 65 year old white female, who presents for a screening colonoscopy.  She does have a history of diverticulitis, status post Hartmann's procedure with colostomy takedown by Dr. Elpidio Anis in the past.  She does complain of some mild constipation.  Risks and benefits of the procedure including bleeding and perforation were fully explained to the patient, gave informed consent.  PROCEDURE NOTE:  The patient was placed in the left lateral decubitus position after placement of monitored anesthesia equipment.  Demerol and Versed were used throughout the procedure for anesthesia.  Rectal examination was performed which revealed an external hemorrhoidal skin tag.  The colonoscope was advanced to the cecum without difficulty. Confirmation of placement to the cecum was done using transabdominal palpation and landmarks.  The cecum was fully visualized and noted be within normal limits.  The bowel preparation was adequate.  The ascending colon, transverse colon, descending colon regions were within normal limits.  No abnormal lesions were noted.  At the anastomotic site, two non-absorbable sutures were seen sticking in the lumen.  These appeared to be transmural in nature, thus were not pulled.  Minimal diverticuli were seen.  The rectum was within normal limits.  On inspection  of the dentate line, no hemorrhoidal disease was noted.  The colonoscope was returned to the neutral position and all air was evacuated from the colon and rectum prior to removal of the endoscope.  The patient tolerated the procedure well, was transferred back to Day Surgery in stable condition.  COMPLICATIONS:  None.  SPECIMEN:  None.  RECOMMENDATIONS:  A follow-up colonoscopy is recommended in 10 years.     Dalia Heading, M.D.     MAJ/MEDQ  D:  07/11/2010  T:  07/12/2010  Job:  [id number]  cc:   Milus Mallick. Lodema Hong, M.D. Fax: [phone]  Electronically Signed by Franky Macho M.D. on 07/12/2010 07:33:36 AM

## 2010-07-12 NOTE — H&P (Signed)
  NAME:  [name], [name] NO.:  [id number]  MEDICAL RECORD NO.:  [id number]  LOCATION:                                 FACILITY:  PHYSICIAN:  Dalia Heading, M.D.  DATE OF BIRTH:  12/28/1945  DATE OF ADMISSION: DATE OF DISCHARGE:  LH                             HISTORY & PHYSICAL   CHIEF COMPLAINT:  Constipation, need for screening colonoscopy.  HISTORY OF PRESENT ILLNESS:  The patient is a 65 year old white female who is referred for endoscopic evaluation.  She needs a colonoscopy due to a history of diverticulitis, constipation, and screening.  No abdominal pain, weight loss, nausea, vomiting, diarrhea, or melena have been noted.  She is status post a Hartmann procedure with colostomy closure in 2005 and 2006 for diverticulitis.  There is no family history of colon cancer.  PAST MEDICAL HISTORY:  High cholesterol levels, hypertension.  PAST SURGICAL HISTORY:  Breast cancer surgery.  CURRENT MEDICATIONS:  Cyclobenzaprine, calcium, naproxen, potassium supplements, hydrocodone, simvastatin, cephalexin.  ALLERGIES:  PENICILLIN and SULFA.  REVIEW OF SYSTEMS:  The patient smokes half pack cigarettes a day.  She denies alcohol or illicit drug use.  She denies any other cardiopulmonary difficulties or bleeding disorders.  FAMILY MEDICAL HISTORY:  Remarkable for cancer, but not colon cancer.  PHYSICAL EXAMINATION:  GENERAL:  The patient is a well-developed, well- nourished white female, in no acute distress.  She is afebrile. VITAL SIGNS:  Stable. HEENT:  Unremarkable. NECK:  Supple without lymphadenopathy. LUNGS:  Clear to auscultation with equal breath sounds bilaterally. HEART:  Regular rate and rhythm without S3, S4, murmurs. ABDOMEN:  Soft, nontender and nondistended.  No hepatosplenomegaly or masses are noted. RECTAL:  Deferred to the procedure.  IMPRESSION:  Need for screening colonoscopy, constipation, history of diverticulitis.  PLAN:  The  patient is scheduled for colonoscopy on July 11, 2010.  The risks and benefits of the procedure including bleeding and perforation were fully explained to the patient, gave informed consent.     Dalia Heading, M.D.     MAJ/MEDQ  D:  06/20/2010  T:  06/21/2010  Job:  [id number]  cc:   Milus Mallick. Lodema Hong, M.D. Fax: [phone]  Electronically Signed by Franky Macho M.D. on 07/12/2010 07:33:34 AM

## 2010-08-31 ENCOUNTER — Other Ambulatory Visit: Payer: Self-pay | Admitting: Family Medicine

## 2010-09-25 ENCOUNTER — Telehealth: Payer: Self-pay | Admitting: Family Medicine

## 2010-09-25 DIAGNOSIS — E785 Hyperlipidemia, unspecified: Secondary | ICD-10-CM

## 2010-09-25 DIAGNOSIS — I10 Essential (primary) hypertension: Secondary | ICD-10-CM

## 2010-09-25 DIAGNOSIS — R7301 Impaired fasting glucose: Secondary | ICD-10-CM

## 2010-09-25 DIAGNOSIS — R5381 Other malaise: Secondary | ICD-10-CM

## 2010-09-27 NOTE — Telephone Encounter (Signed)
Lab order mailed as requested.

## 2010-09-27 NOTE — Telephone Encounter (Signed)
WILL THIS PATIENT NEED ANY LABS

## 2010-09-27 NOTE — Telephone Encounter (Signed)
Cbc, fasting lipid, hepatic, chem7  and HBa1C pls

## 2010-10-02 LAB — BASIC METABOLIC PANEL
BUN: 17 mg/dL (ref 6–23)
CO2: 29 mEq/L (ref 19–32)
Chloride: 102 mEq/L (ref 96–112)
Glucose, Bld: 118 mg/dL — ABNORMAL HIGH (ref 70–99)
Potassium: 4.8 mEq/L (ref 3.5–5.3)
Sodium: 140 mEq/L (ref 135–145)

## 2010-10-02 LAB — CBC WITH DIFFERENTIAL/PLATELET
Basophils Relative: 1 % (ref 0–1)
Eosinophils Absolute: 0.9 10*3/uL — ABNORMAL HIGH (ref 0.0–0.7)
Eosinophils Relative: 8 % — ABNORMAL HIGH (ref 0–5)
MCH: 28.2 pg (ref 26.0–34.0)
MCHC: 30.5 g/dL (ref 30.0–36.0)
MCV: 92.6 fL (ref 78.0–100.0)
Monocytes Relative: 10 % (ref 3–12)
Neutrophils Relative %: 48 % (ref 43–77)
Platelets: 473 10*3/uL — ABNORMAL HIGH (ref 150–400)

## 2010-10-02 LAB — HEPATIC FUNCTION PANEL
ALT: 15 U/L (ref 0–35)
Bilirubin, Direct: 0.1 mg/dL (ref 0.0–0.3)
Indirect Bilirubin: 0.2 mg/dL (ref 0.0–0.9)
Total Bilirubin: 0.3 mg/dL (ref 0.3–1.2)

## 2010-10-02 LAB — LIPID PANEL
Cholesterol: 163 mg/dL (ref 0–200)
Total CHOL/HDL Ratio: 3.7 Ratio
VLDL: 42 mg/dL — ABNORMAL HIGH (ref 0–40)

## 2010-10-02 LAB — HEMOGLOBIN A1C: Hgb A1c MFr Bld: 6 % — ABNORMAL HIGH (ref <5.7)

## 2010-10-04 LAB — COMPREHENSIVE METABOLIC PANEL
AST: 18
BUN: 16
CO2: 28
Chloride: 101
Creatinine, Ser: 0.85
GFR calc non Af Amer: >60
Glucose, Bld: 102 — ABNORMAL HIGH
Total Bilirubin: 0.5

## 2010-10-04 LAB — CBC
HCT: 42.5
Hemoglobin: 14.1
MCV: 86.3
RBC: 4.93
WBC: 14.3 — ABNORMAL HIGH

## 2010-10-11 ENCOUNTER — Encounter: Payer: Self-pay | Admitting: Family Medicine

## 2010-10-12 ENCOUNTER — Encounter: Payer: Self-pay | Admitting: Family Medicine

## 2010-10-12 ENCOUNTER — Ambulatory Visit (INDEPENDENT_AMBULATORY_CARE_PROVIDER_SITE_OTHER): Payer: Medicare Other | Admitting: Family Medicine

## 2010-10-12 VITALS — BP 130/80 | HR 96 | Resp 16 | Ht 65.0 in | Wt 180.1 lb

## 2010-10-12 DIAGNOSIS — J309 Allergic rhinitis, unspecified: Secondary | ICD-10-CM

## 2010-10-12 DIAGNOSIS — I10 Essential (primary) hypertension: Secondary | ICD-10-CM

## 2010-10-12 DIAGNOSIS — R7301 Impaired fasting glucose: Secondary | ICD-10-CM

## 2010-10-12 DIAGNOSIS — R7309 Other abnormal glucose: Secondary | ICD-10-CM

## 2010-10-12 DIAGNOSIS — F17200 Nicotine dependence, unspecified, uncomplicated: Secondary | ICD-10-CM

## 2010-10-12 DIAGNOSIS — R7303 Prediabetes: Secondary | ICD-10-CM

## 2010-10-12 DIAGNOSIS — E785 Hyperlipidemia, unspecified: Secondary | ICD-10-CM

## 2010-10-12 DIAGNOSIS — Z23 Encounter for immunization: Secondary | ICD-10-CM

## 2010-10-12 MED ORDER — INFLUENZA VAC TYPES A & B PF IM SUSP: 0.5000 mL | Freq: Once | INTRAMUSCULAR | Status: DC

## 2010-10-12 MED ORDER — SIMVASTATIN 20 MG PO TABS: 20.0000 mg | ORAL_TABLET | Freq: Every day | ORAL | Status: DC

## 2010-10-12 NOTE — Progress Notes (Signed)
  Subjective:    Patient ID: [name], female    DOB: 05/19/1945, 65 y.o.   MRN: [id number]  HPI C/o cyst on dorsum of right foot, has been seen by ortho who advises watchful waiting. Pt has questions as to whether she needs antibiotics for this I assure her no sign of infection in the area Her nicotine use is gradually decreasing , but no quit date set. The PT is here for follow up and re-evaluation of chronic medical conditions, medication management and review of any available recent lab and radiology data.  Preventive health is updated, specifically  Cancer screening and Immunization.   Questions or concerns regarding consultations or procedures which the PT has had in the interim are  addressed. The PT denies any adverse reactions to current medications since the last visit.        Review of Systems Denies recent fever or chills. Denies sinus pressure, nasal congestion, ear pain or sore throat. Denies chest congestion, productive cough or wheezing. Denies chest pains, palpitations and leg swelling Denies abdominal pain, nausea, vomiting,diarrhea or constipation.   Denies dysuria, frequency, hesitancy or incontinence. Denies headaches, seizures, numbness, or tingling. Denies depression, anxiety or insomnia. .        Objective:   Physical Exam Patient alert and oriented and in no cardiopulmonary distress.  HEENT: No facial asymmetry, EOMI, no sinus tenderness,  oropharynx pink and moist.  Neck supple no adenopathy.  Chest: Clear to auscultation bilaterally.decreased air entry throughout  CVS: S1, S2 no murmurs, no S3.  ABD: Soft non tender. Bowel sounds normal.  Ext: No edema  MS: Adequate ROM spine, shoulders, hips and knees.  Skin: Intact, small cyst on dorsum of right foot  Psych: Good eye contact, normal affect. Memory intact not anxious or depressed appearing.  CNS: CN 2-12 intact, power, tone and sensation normal throughout.        Assessment &  Plan:

## 2010-10-12 NOTE — Patient Instructions (Addendum)
CPE feb 17 or after.  Fasting labs 3 to 7 days before f/u Lipid, hepatic, chem 7 , HBA1C  You are prediabetic and need to change how you eat I recommend you sign up for a class about this at the hospital.  Flu vaccine today.  Pls schedule your mammogram, it is due 11/30 or after.  It is important that you exercise regularly at least 30 minutes 5 times a week. If you develop chest pain, have severe difficulty breathing, or feel very tired, stop exercising immediately and seek medical attention   A healthy diet is rich in fruit, vegetables and whole grains. Poultry fish, nuts and beans are a healthy choice for protein rather then red meat. A low sodium diet and drinking 64 ounces of water daily is generally recommended. Oils and sweet should be limited. Carbohydrates especially for those who are diabetic or overweight, should be limited to 34-45 gram per meal. It is important to eat on a regular schedule, at least 3 times daily. Snacks should be primarily fruits, vegetables or nuts.

## 2010-10-13 LAB — COMPREHENSIVE METABOLIC PANEL
CO2: 33 — ABNORMAL HIGH
Calcium: 10.6 — ABNORMAL HIGH
Creatinine, Ser: 1.01
GFR calc Af Amer: >60
GFR calc non Af Amer: 56 — ABNORMAL LOW
Glucose, Bld: 129 — ABNORMAL HIGH

## 2010-10-15 NOTE — Assessment & Plan Note (Signed)
Hyperlipidemia:Low fat diet discussed and encouraged.  In view of high Tg counseled to reduce espescialy cheese and butter

## 2010-10-15 NOTE — Assessment & Plan Note (Signed)
Pt now prediabetic , based on elevated HBA1C, aggressive lifestyle changes need to be made

## 2010-10-15 NOTE — Assessment & Plan Note (Signed)
Controlled, no change in medication  

## 2010-10-15 NOTE — Assessment & Plan Note (Signed)
Cutting back gradually

## 2010-10-20 ENCOUNTER — Telehealth: Payer: Self-pay | Admitting: Family Medicine

## 2010-10-20 LAB — COMPREHENSIVE METABOLIC PANEL
ALT: 22 U/L (ref 0–35)
Alkaline Phosphatase: 69 U/L (ref 39–117)
CO2: 34 mEq/L — ABNORMAL HIGH (ref 19–32)
GFR calc non Af Amer: >60 mL/min (ref >60)
Glucose, Bld: 133 mg/dL — ABNORMAL HIGH (ref 70–99)
Potassium: 2.9 mEq/L — ABNORMAL LOW (ref 3.5–5.1)
Sodium: 140 mEq/L (ref 135–145)
Total Protein: 7.5 g/dL (ref 6.0–8.3)

## 2010-10-20 LAB — CBC
Hemoglobin: 14.4 g/dL (ref 12.0–15.0)
RBC: 4.93 MIL/uL (ref 3.87–5.11)

## 2010-10-20 LAB — DIFFERENTIAL
Basophils Relative: 1 % (ref 0–1)
Eosinophils Absolute: 0.3 10*3/uL (ref 0.0–0.7)
Eosinophils Relative: 3 % (ref 0–5)
Monocytes Relative: 9 % (ref 3–12)
Neutrophils Relative %: 62 % (ref 43–77)

## 2010-10-20 LAB — URINALYSIS, ROUTINE W REFLEX MICROSCOPIC
Nitrite: NEGATIVE
Specific Gravity, Urine: 1.02 (ref 1.005–1.030)
pH: 6 (ref 5.0–8.0)

## 2010-10-20 LAB — IRON AND TIBC: TIBC: 331 ug/dL (ref 250–470)

## 2010-10-20 MED ORDER — BENZONATATE 100 MG PO CAPS: 100.0000 mg | ORAL_CAPSULE | Freq: Three times a day (TID) | ORAL | Status: DC

## 2010-10-20 MED ORDER — FLUTICASONE PROPIONATE 50 MCG/ACT NA SUSP: 1.0000 | Freq: Two times a day (BID) | NASAL | Status: DC

## 2010-10-20 NOTE — Telephone Encounter (Signed)
Called patient no answer.

## 2010-10-20 NOTE — Telephone Encounter (Signed)
Called patient no answer. Left message that meds were sent to her pharmacy

## 2010-10-20 NOTE — Telephone Encounter (Signed)
Please advise her she can not get the flu from the flu shot. She can use the tessalon perrles and nose spray. She can come in for a visit next week if still not better She should not be smoking during this time If she has fever, difficulty breathing go to the ER

## 2010-10-20 NOTE — Telephone Encounter (Signed)
Nasal congestion and drainage down her throat making her cough. Dark colored phlegm, bodyaches. Hasn't checked her temp. Left ear pain (states she keeps fluid in it) Wants something called in to [facility] or appt

## 2010-10-23 NOTE — Telephone Encounter (Signed)
Called patient again, no answer

## 2010-10-24 LAB — DIFFERENTIAL
Basophils Absolute: 0.1
Basophils Relative: 1
Eosinophils Absolute: 0.5
Neutro Abs: 8.4 — ABNORMAL HIGH
Neutrophils Relative %: 60

## 2010-10-24 LAB — URINALYSIS, ROUTINE W REFLEX MICROSCOPIC
Glucose, UA: NEGATIVE
Ketones, ur: NEGATIVE
Specific Gravity, Urine: 1.01
pH: 6

## 2010-10-24 LAB — CBC
HCT: 43
Hemoglobin: 14.4
RBC: 4.97
WBC: 14 — ABNORMAL HIGH

## 2010-10-24 LAB — COMPREHENSIVE METABOLIC PANEL
ALT: 19
Alkaline Phosphatase: 67
BUN: 17
CO2: 35 — ABNORMAL HIGH
Chloride: 95 — ABNORMAL LOW
Glucose, Bld: 93
Potassium: 3.6
Sodium: 137
Total Bilirubin: 0.3

## 2010-10-26 ENCOUNTER — Telehealth: Payer: Self-pay | Admitting: Family Medicine

## 2010-10-27 NOTE — Telephone Encounter (Signed)
Patient aware.

## 2010-10-27 NOTE — Telephone Encounter (Signed)
Needs to go to urgent care or ed for evaluation no appts availble, if feels so sick needs to be seen

## 2010-11-02 LAB — CBC
MCHC: 34.6
MCV: 84.6
Platelets: 427 — ABNORMAL HIGH
RBC: 4.55
WBC: 10

## 2010-11-06 ENCOUNTER — Other Ambulatory Visit (HOSPITAL_COMMUNITY): Payer: Self-pay | Admitting: Oncology

## 2010-11-06 DIAGNOSIS — C50919 Malignant neoplasm of unspecified site of unspecified female breast: Secondary | ICD-10-CM

## 2010-12-14 ENCOUNTER — Other Ambulatory Visit: Payer: Self-pay | Admitting: Family Medicine

## 2010-12-20 ENCOUNTER — Ambulatory Visit (HOSPITAL_COMMUNITY)
Admission: RE | Admit: 2010-12-20 | Discharge: 2010-12-20 | Disposition: A | Discharge status: Home / Self Care | Payer: Medicare Other | Source: Ambulatory Visit | Attending: Oncology | Admitting: Oncology

## 2010-12-20 DIAGNOSIS — Z853 Personal history of malignant neoplasm of breast: Secondary | ICD-10-CM | POA: Insufficient documentation

## 2010-12-20 DIAGNOSIS — C50919 Malignant neoplasm of unspecified site of unspecified female breast: Secondary | ICD-10-CM

## 2011-01-15 ENCOUNTER — Other Ambulatory Visit: Payer: Self-pay | Admitting: Family Medicine

## 2011-01-17 ENCOUNTER — Telehealth: Payer: Self-pay | Admitting: Family Medicine

## 2011-01-18 NOTE — Telephone Encounter (Signed)
Called pt left message .

## 2011-01-19 NOTE — Telephone Encounter (Signed)
Has green drainage and green sputum and earache. Advised UC and she wanted appt 4 days out so [facility] could provide transportation. Luann to schedule

## 2011-01-22 ENCOUNTER — Ambulatory Visit: Payer: Medicare Other | Admitting: Family Medicine

## 2011-01-22 ENCOUNTER — Encounter: Payer: Self-pay | Admitting: Family Medicine

## 2011-01-22 ENCOUNTER — Ambulatory Visit (INDEPENDENT_AMBULATORY_CARE_PROVIDER_SITE_OTHER): Payer: Medicare Other | Admitting: Family Medicine

## 2011-01-22 VITALS — BP 128/74 | HR 93 | Temp 99.1°F | Resp 20 | Ht 65.0 in | Wt 178.0 lb

## 2011-01-22 DIAGNOSIS — F17200 Nicotine dependence, unspecified, uncomplicated: Secondary | ICD-10-CM | POA: Diagnosis not present

## 2011-01-22 DIAGNOSIS — J441 Chronic obstructive pulmonary disease with (acute) exacerbation: Secondary | ICD-10-CM | POA: Diagnosis not present

## 2011-01-22 MED ORDER — PREDNISONE 10 MG PO TABS: ORAL_TABLET | ORAL | Status: DC

## 2011-01-22 MED ORDER — BENZONATATE 100 MG PO CAPS: 100.0000 mg | ORAL_CAPSULE | Freq: Three times a day (TID) | ORAL | Status: DC

## 2011-01-22 MED ORDER — DOXYCYCLINE HYCLATE 100 MG PO TABS: 100.0000 mg | ORAL_TABLET | Freq: Two times a day (BID) | ORAL | Status: AC

## 2011-01-22 NOTE — Patient Instructions (Signed)
I am treating it for bronchitis exacerbation or COPD. Take the antibiotics and steroids as directed on the bottle.  If you do not improve or your breathing gets worse go to the ER if after hours. I encourage you to quit smoking this would be a great time to do so. If he would like help quitting smoking please let us know Quit line- [phone])

## 2011-01-22 NOTE — Assessment & Plan Note (Addendum)
Will treat with course of doxycyline and steroids- second illness and current smoker Given Depo medrol in clinic and neb treatment Refilled tessalon perrles

## 2011-01-22 NOTE — Progress Notes (Signed)
  Subjective:    Patient ID: [name], female    DOB: 03/06/1945, 66 y.o.   MRN: [id number]  HPI Congestion in chest worse over the past week, +SOB, + productive cough, no wheeze, history of COPD +sore throat, itchy ears, +subjective fever , +body aches Has tried DM   Had similar symptoms 1 month ago seen at urgent care, treated for COPD exacerbation with azithromycin, felt she was getting better but then got worse  +sick contacts    Review of Systems   Per above   CVS- denies CP   GI- denies N/V    Objective:   Physical Exam GEN- NAD, alert and oriented x3 HEENT- PERRL, EOMI, non injected sclera, pink conjunctiva, MMM, oropharynx- mild injection, TM clear bilat, nares clear rhinorrhea Neck- Supple,  CVS- RRR, no murmur RESP-bilateral wheeze and rhonchi at bases, increased WOB with cough, no retractions, speaks in full sentences, +bronchospasm, fair air movement EXT- No edema Pulses- Radial, DP- 2+   S/p neb- improved air movement     Assessment & Plan:

## 2011-01-22 NOTE — Assessment & Plan Note (Signed)
Pt trying to quit, advised cessation

## 2011-01-23 MED ORDER — IPRATROPIUM BROMIDE 0.02 % IN SOLN
0.5000 mg | Freq: Once | RESPIRATORY_TRACT | Status: AC
  Administered 2011-01-22: 0.5 mg via RESPIRATORY_TRACT

## 2011-01-23 MED ORDER — ALBUTEROL SULFATE (5 MG/ML) 0.5% IN NEBU
2.5000 mg | INHALATION_SOLUTION | Freq: Once | RESPIRATORY_TRACT | Status: AC
  Administered 2011-01-22: 2.5 mg via RESPIRATORY_TRACT

## 2011-01-23 MED ORDER — METHYLPREDNISOLONE ACETATE 80 MG/ML IJ SUSP
80.0000 mg | Freq: Once | INTRAMUSCULAR | Status: AC
  Administered 2011-01-22: 80 mg via INTRAMUSCULAR

## 2011-01-23 NOTE — Progress Notes (Signed)
Addended by: Kandis Fantasia B on: 01/23/2011 01:52 PM   Modules accepted: Orders

## 2011-01-23 NOTE — Progress Notes (Signed)
Addended by: Kandis Fantasia B on: 01/23/2011 11:55 AM   Modules accepted: Orders

## 2011-01-26 ENCOUNTER — Ambulatory Visit: Payer: Medicare Other | Admitting: Family Medicine

## 2011-02-06 ENCOUNTER — Telehealth: Payer: Self-pay | Admitting: Family Medicine

## 2011-02-06 NOTE — Telephone Encounter (Signed)
Ok to refill? Only see combivent that was entered historically

## 2011-02-06 NOTE — Telephone Encounter (Signed)
yespls refill x3 after you verify with pharmacy and pt that this is what she needs

## 2011-02-07 NOTE — Telephone Encounter (Signed)
REFILLED X 3 PER PHARMACY REQUEST

## 2011-02-13 ENCOUNTER — Telehealth: Payer: Self-pay | Admitting: Family Medicine

## 2011-02-13 NOTE — Telephone Encounter (Signed)
No antibiotic without OV

## 2011-02-15 NOTE — Telephone Encounter (Signed)
Called pt again and no answer.

## 2011-02-15 NOTE — Telephone Encounter (Signed)
Called pt but got no answer

## 2011-02-21 ENCOUNTER — Other Ambulatory Visit: Payer: Self-pay | Admitting: Family Medicine

## 2011-02-22 DIAGNOSIS — M25559 Pain in unspecified hip: Secondary | ICD-10-CM | POA: Diagnosis not present

## 2011-02-22 DIAGNOSIS — M25579 Pain in unspecified ankle and joints of unspecified foot: Secondary | ICD-10-CM | POA: Diagnosis not present

## 2011-02-22 DIAGNOSIS — M25519 Pain in unspecified shoulder: Secondary | ICD-10-CM | POA: Diagnosis not present

## 2011-02-23 ENCOUNTER — Other Ambulatory Visit: Payer: Self-pay | Admitting: Family Medicine

## 2011-02-26 DIAGNOSIS — E785 Hyperlipidemia, unspecified: Secondary | ICD-10-CM | POA: Diagnosis not present

## 2011-02-26 DIAGNOSIS — I10 Essential (primary) hypertension: Secondary | ICD-10-CM | POA: Diagnosis not present

## 2011-02-26 DIAGNOSIS — R7309 Other abnormal glucose: Secondary | ICD-10-CM | POA: Diagnosis not present

## 2011-02-27 LAB — HEPATIC FUNCTION PANEL
ALT: 13 U/L (ref 0–35)
AST: 14 U/L (ref 0–37)
Bilirubin, Direct: <0.1 mg/dL (ref 0.0–0.3)

## 2011-02-27 LAB — BASIC METABOLIC PANEL
Calcium: 10.1 mg/dL (ref 8.4–10.5)
Glucose, Bld: 98 mg/dL (ref 70–99)
Sodium: 142 mEq/L (ref 135–145)

## 2011-02-27 LAB — HEMOGLOBIN A1C
Hgb A1c MFr Bld: 6.2 % — ABNORMAL HIGH (ref <5.7)
Mean Plasma Glucose: 131 mg/dL — ABNORMAL HIGH (ref <117)

## 2011-02-27 LAB — LIPID PANEL
Cholesterol: 207 mg/dL — ABNORMAL HIGH (ref 0–200)
Total CHOL/HDL Ratio: 3.5 Ratio

## 2011-03-06 ENCOUNTER — Encounter: Payer: Self-pay | Admitting: Family Medicine

## 2011-03-06 ENCOUNTER — Ambulatory Visit (INDEPENDENT_AMBULATORY_CARE_PROVIDER_SITE_OTHER): Payer: Medicare Other | Admitting: Family Medicine

## 2011-03-06 ENCOUNTER — Other Ambulatory Visit (HOSPITAL_COMMUNITY)
Admission: RE | Admit: 2011-03-06 | Discharge: 2011-03-06 | Disposition: A | Discharge status: Home / Self Care | Payer: Medicare Other | Source: Ambulatory Visit | Attending: Family Medicine | Admitting: Family Medicine

## 2011-03-06 VITALS — BP 130/70 | HR 106 | Resp 16 | Ht 65.0 in | Wt 176.1 lb

## 2011-03-06 DIAGNOSIS — J449 Chronic obstructive pulmonary disease, unspecified: Secondary | ICD-10-CM

## 2011-03-06 DIAGNOSIS — J320 Chronic maxillary sinusitis: Secondary | ICD-10-CM | POA: Diagnosis not present

## 2011-03-06 DIAGNOSIS — E785 Hyperlipidemia, unspecified: Secondary | ICD-10-CM | POA: Diagnosis not present

## 2011-03-06 DIAGNOSIS — Z0000 Encounter for general adult medical examination without abnormal findings: Secondary | ICD-10-CM

## 2011-03-06 DIAGNOSIS — R7301 Impaired fasting glucose: Secondary | ICD-10-CM

## 2011-03-06 DIAGNOSIS — Z124 Encounter for screening for malignant neoplasm of cervix: Secondary | ICD-10-CM | POA: Insufficient documentation

## 2011-03-06 LAB — POC HEMOCCULT BLD/STL (OFFICE/1-CARD/DIAGNOSTIC): Fecal Occult Blood, POC: NEGATIVE

## 2011-03-06 MED ORDER — FLUTICASONE PROPIONATE 50 MCG/ACT NA SUSP: 1.0000 | Freq: Two times a day (BID) | NASAL | Status: DC

## 2011-03-06 MED ORDER — ERYTHROMYCIN BASE 250 MG PO TABS: ORAL_TABLET | ORAL | Status: DC

## 2011-03-06 MED ORDER — SIMVASTATIN 20 MG PO TABS: 20.0000 mg | ORAL_TABLET | Freq: Every day | ORAL | Status: DC

## 2011-03-06 NOTE — Patient Instructions (Signed)
F/u in 5 month 3 weeks.  Call if you need to be seen before  You are treated for sinusitis  Cut back to 9 cigarettes daily in March, 8 daily in April, 7 in May and keep reducing every month. You can do this if you really want to, you need to quit!  You are referred for lung function testing   Pls stop mint patties, blood sugar and cholesterol have increased  It is important that you exercise regularly at least 30 minutes 5 times a week. If you develop chest pain, have severe difficulty breathing, or feel very tired, stop exercising immediately and seek medical attention    A healthy diet is rich in fruit, vegetables and whole grains. Poultry fish, nuts and beans are a healthy choice for protein rather then red meat. A low sodium diet and drinking 64 ounces of water daily is generally recommended. Oils and sweet should be limited. Carbohydrates especially for those who are diabetic or overweight, should be limited to 30-45 gram per meal. It is important to eat on a regular schedule, at least 3 times daily. Snacks should be primarily fruits, vegetables or nuts. HBA1C fasting lipid  And cmp in 5 month 3 weeks

## 2011-03-09 ENCOUNTER — Telehealth: Payer: Self-pay | Admitting: Family Medicine

## 2011-03-09 NOTE — Telephone Encounter (Signed)
Pt needs to be referred for lung function test please, the request is at the hospital, just call Misty Stanley at [phone] to get date and time so you can notify the pt pls

## 2011-03-09 NOTE — Telephone Encounter (Signed)
Pt has appt at [facility] for [facility]FT on 03/20/2011 10:30. Pt was called and is aware of appt and time.

## 2011-03-12 ENCOUNTER — Encounter (HOSPITAL_COMMUNITY): Payer: Medicare Other | Attending: Oncology | Admitting: Oncology

## 2011-03-12 VITALS — BP 157/79 | HR 97 | Temp 98.1°F | Wt 178.8 lb

## 2011-03-12 DIAGNOSIS — E119 Type 2 diabetes mellitus without complications: Secondary | ICD-10-CM

## 2011-03-12 DIAGNOSIS — J4489 Other specified chronic obstructive pulmonary disease: Secondary | ICD-10-CM

## 2011-03-12 DIAGNOSIS — Z853 Personal history of malignant neoplasm of breast: Secondary | ICD-10-CM

## 2011-03-12 DIAGNOSIS — F17200 Nicotine dependence, unspecified, uncomplicated: Secondary | ICD-10-CM | POA: Diagnosis not present

## 2011-03-12 DIAGNOSIS — J449 Chronic obstructive pulmonary disease, unspecified: Secondary | ICD-10-CM | POA: Diagnosis not present

## 2011-03-12 DIAGNOSIS — C50919 Malignant neoplasm of unspecified site of unspecified female breast: Secondary | ICD-10-CM

## 2011-03-12 NOTE — Progress Notes (Signed)
CC:   Milus Mallick. Lodema Hong, M.D. Billie Lade, Ph.D., M.D. Dalia Heading, M.D.  DIAGNOSES: 1. Stage I (T1b N0 M0), grade 1 well-differentiated carcinoma of the     left breast status, post lumpectomy followed by radiation therapy.     Her estrogen receptor receptors were 93%, progesterone receptors     67%.  HER-2/neu was negative.  No lymphovascular space invasion was     seen.  All margins were clear.  Ki-67 marker was low at 1% with     surgery on 11/15/2004.  Treated then with post-lumpectomy     radiation, finished as of 02/22/2005, followed by Aromasin, which     she could not tolerate.  We then switched her to Femara, which she     finished all therapy as of January 14, 2010.  She actually had     started her therapy officially on 12/11/2004. 2. Chronic obstructive pulmonary disease, still smoking 2-3 cigarettes     a day. 3. Diabetes mellitus. 4. Hypertension. 5. Hypokalemia in the past. 6. Chronic anxiety and depression with the death of her daughter at     age 43 from a possible overdose, though her daughter had a bipolar     disorder. 7. Vaginal hysterectomy years ago for benign disease. 8. Kidney stones in the past. 9. Poor dental hygiene. 10.Right wrist trauma in the past. 11.Right ankle trauma from a fall in the past. 12.Broken ribs, looked after by Dr. Hilda Lias in January 2012. Juliah had blood work by Dr. Lodema Hong in September as well as February, and I do not think I need to repeat any.  She is feeling well, she states.  Has no complaints.  She was told by Dr. Lodema Hong that her sugar could be a little bit better controlled and her hemoglobin A1c on February 11th was 6.2.  She was given a sheet of paper, she states, with some foods that she should eat but she states it was confusing.  So we will get her a diabetic dietitian consult and then see if that can help her.  From the standpoint of her cancer, she still remains disease-free with no evidence of disease.   We will see her back in a year and follow her along.  She has mammography on a regular annual basis and most recently it was on 12/20/2010.  It showed no evidence for disease in either breast.    ______________________________ Ladona Horns. Mariel Sleet, MD ESN/MEDQ  D:  03/12/2011  T:  03/12/2011  Job:  [id number]

## 2011-03-12 NOTE — Progress Notes (Signed)
This office note has been dictated.

## 2011-03-12 NOTE — Patient Instructions (Addendum)
[name]  [id number] 05/26/1945   [facility]  Discharge Instructions  RECOMMENDATIONS MADE BY THE CONSULTANT AND ANY TEST RESULTS WILL BE SENT TO YOUR REFERRING DOCTOR.   EXAM FINDINGS BY MD TODAY AND SIGNS AND SYMPTOMS TO REPORT TO CLINIC OR PRIMARY MD: you are doing well. We will try to get a nutritional consult for help with managing your elevated blood sugar.  The dietitian will contact you with a date and time for an appointment. MEDICATIONS PRESCRIBED: none   INSTRUCTIONS GIVEN AND DISCUSSED: Report any new lumps, bone pain or shortness of breath.  SPECIAL INSTRUCTIONS/FOLLOW-UP: Return to Clinic in 1 year to see PA.     I acknowledge that I have been informed and understand all the instructions given to me and received a copy. I do not have any more questions at this time, but understand that I may call the Specialty Clinic at [facility] at [phone] during business hours should I have any further questions or need assistance in obtaining follow-up care.    __________________________________________  _____________  __________ Signature of Patient or Authorized Representative            Date                   Time    __________________________________________ Nurse's Signature

## 2011-03-13 ENCOUNTER — Telehealth (HOSPITAL_COMMUNITY): Payer: Self-pay | Admitting: Dietician

## 2011-03-13 NOTE — Telephone Encounter (Signed)
Received referral from [facility] (Dr. Neijstrom) for dx: diabetes.  

## 2011-03-13 NOTE — Telephone Encounter (Signed)
Appointment scheduled for 03/26/11 at 1:30 PM.

## 2011-03-20 ENCOUNTER — Ambulatory Visit (HOSPITAL_COMMUNITY)
Admission: RE | Admit: 2011-03-20 | Discharge: 2011-03-20 | Disposition: A | Discharge status: Home / Self Care | Payer: Medicare Other | Source: Ambulatory Visit | Attending: Family Medicine | Admitting: Family Medicine

## 2011-03-20 DIAGNOSIS — R0989 Other specified symptoms and signs involving the circulatory and respiratory systems: Secondary | ICD-10-CM | POA: Diagnosis not present

## 2011-03-20 DIAGNOSIS — R0609 Other forms of dyspnea: Secondary | ICD-10-CM | POA: Diagnosis not present

## 2011-03-20 DIAGNOSIS — J449 Chronic obstructive pulmonary disease, unspecified: Secondary | ICD-10-CM

## 2011-03-20 MED ORDER — ALBUTEROL SULFATE (5 MG/ML) 0.5% IN NEBU
2.5000 mg | INHALATION_SOLUTION | Freq: Once | RESPIRATORY_TRACT | Status: AC
  Administered 2011-03-20: 2.5 mg via RESPIRATORY_TRACT

## 2011-03-22 DIAGNOSIS — M25559 Pain in unspecified hip: Secondary | ICD-10-CM | POA: Diagnosis not present

## 2011-03-22 DIAGNOSIS — M25519 Pain in unspecified shoulder: Secondary | ICD-10-CM | POA: Diagnosis not present

## 2011-03-22 DIAGNOSIS — M25579 Pain in unspecified ankle and joints of unspecified foot: Secondary | ICD-10-CM | POA: Diagnosis not present

## 2011-03-25 ENCOUNTER — Encounter: Payer: Self-pay | Admitting: Family Medicine

## 2011-03-25 NOTE — Assessment & Plan Note (Signed)
Acute infection antibiotic course prescribed 

## 2011-03-25 NOTE — Progress Notes (Signed)
  Subjective:    Patient ID: [name], female    DOB: 04/12/1945, 66 y.o.   MRN: [id number]  HPI The PT is here for annual exam  and re-evaluation of chronic medical conditions, medication management and review of any available recent lab and radiology data.  Preventive health is updated, specifically  Cancer screening and Immunization.   Questions or concerns regarding consultations or procedures which the PT has had in the interim are  addressed. The PT denies any adverse reactions to current medications since the last visit.  5 day h/o increased facial pressure over cheeks with yellow foul tasting post nasal rainage also intermittent chills       Review of Systems See HPI Denies recent fever Denies  ear pain or sore throat. Chronic chest congestion with  productive cough and wheezing. Denies chest pains, palpitations and leg swelling Denies abdominal pain, nausea, vomiting,diarrhea or constipation.   Denies dysuria, frequency, hesitancy or incontinence. Denies joint pain, swelling and limitation in mobility. Denies headaches, seizures, numbness, or tingling. Denies uncontrolled  depression, anxiety or insomnia. Denies skin break down or rash.        Objective:   Physical Exam  Pleasant well nourished female, alert and oriented x 3, in no cardio-pulmonary distress. Afebrile. HEENT No facial trauma or asymetry.Left maxillary  Sinus tender.  EOMI, PERTL, fundoscopic exam is normal, no hemorhage or exudate.  External ears normal, tympanic membranes clear. Oropharynx moist, no exudate,poor dentition. Neck: supple, adenopathy,, no JVD or thyromegaly.No bruits.  Chest: Decreased air entry, bilateralwheezes Non tender to palpation  Breast: No asymetry,no masses. No nipple discharge or inversion. No axillary or supraclavicular adenopathy  Cardiovascular system; Heart sounds normal,  S1 and  S2 ,no S3.  No murmur, or thrill. Apical beat not displaced Peripheral  pulses normal.  Abdomen: Soft, non tender, no organomegaly or masses. No bruits. Bowel sounds normal. No guarding, tenderness or rebound.  Rectal:  No mass. Guaiac negative stool.  GU: External genitalia normal. No lesions. Vaginal canal normal.No discharge. Uterus absent, no adnexal masses, no  adnexal tenderness.  Musculoskeletal exam: Full ROM of spine, hips , shoulders and knees. No deformity ,swelling or crepitus noted. No muscle wasting or atrophy.   Neurologic: Cranial nerves 2 to 12 intact. Power, tone ,sensation and reflexes normal throughout. No disturbance in gait. No tremor.  Skin: Intact, no ulceration, erythema , scaling or rash noted. Pigmentation normal throughout  Psych; Normal mood and affect. Judgement and concentration normal       Assessment & Plan:

## 2011-03-25 NOTE — Assessment & Plan Note (Signed)
Deteriorated, dietary change no change in medcation

## 2011-03-26 ENCOUNTER — Encounter (HOSPITAL_COMMUNITY): Payer: Self-pay | Admitting: Dietician

## 2011-03-26 NOTE — Progress Notes (Signed)
Outpatient Initial Nutrition Assessment  Date:03/26/2011   Time: 1:30 PM  Referring Physician: Dr. Mariel Sleet Reason for Visit: pre-diabetes  Nutrition Assessment:   Height: 5\' 5"  (165.1 cm)   :Weight: 175 lb (79.379 kg)   IBW: 125# %IBW: 140% UBW: 175# %UBW: 100%  Body mass index is 29.12 kg/(m^2).  Goal Weight: 157# (10% weight loss) Weight hx: Pt reports her highest weight was 180# last year, due to inactivity. Her lowest weight was 99#, which she maintained until age 40. She believes that her partial hysterectomy at age 69 has caused her to gradually gain weight.  Estimated nutritional needs: 1552-1692 kcals, 64-80 grams protein daily, 1.6-1.7 L fluid daily  PMH:  Past Medical History  Diagnosis Date  . Hypertension   . Breast cancer, left breast   . Nicotine dependence   . COPD (chronic obstructive pulmonary disease)   . Osteoarthritis   . Kidney stones   . Diverticula of colon   . Hyperlipidemia     Medications:  Current Outpatient Rx  Name Route Sig Dispense Refill  . IPRATROPIUM-ALBUTEROL 18-103 MCG/ACT IN AERO Inhalation Inhale 2 puffs into the lungs 3 (three) times daily as needed.      Marland Kitchen VITAMIN D 1000 UNITS PO TABS Oral Take 1,000 Units by mouth daily.      Marland Kitchen CLARITIN 10 MG PO TABS  TAKE 1 TABLET BY MOUTH ONCE DAILY FOR ALLERGIES. 30 each 4  . CYCLOBENZAPRINE HCL 10 MG PO TABS Oral Take 10 mg by mouth 3 (three) times daily as needed.      . ERYTHROMYCIN BASE 250 MG PO TABS  One tablet three times daily 30 tablet 0  . FLUTICASONE PROPIONATE 50 MCG/ACT NA SUSP Nasal Place 1 spray into the nose 2 (two) times daily. 16 g 4  . HYDROCODONE-ACETAMINOPHEN 7.5-325 MG PO TABS Oral Take 1 tablet by mouth every 4 (four) hours as needed.      . IBUPROFEN PM PO  Prn      . IPRATROPIUM-ALBUTEROL 0.5-2.5 (3) MG/3ML IN SOLN Nebulization Take 3 mLs by nebulization 3 (three) times daily. 360 mL 1  . LOTENSIN HCT 20-12.5 MG PO TABS  TAKE 1 TABLET BY MOUTH DAILY. 30 each 3  . MIRALAX  PO POWD  MIX 1 CAPFUL (17G) IN 8 OUNCES OF JUICE/WATER AND DRINK ONCE DAILY. 1 Bottle 6  . NAPROXEN 500 MG PO TABS Oral Take 500 mg by mouth 2 (two) times daily with a meal.      . POTASSIUM CHLORIDE 20 MEQ PO PACK Oral Take 20 mEq by mouth daily. 30 tablet 4  . POTASSIUM CHLORIDE CRYS ER 20 MEQ PO TBCR  TAKE 1 TABLET BY MOUTH DAILY. 30 tablet 3  . PREDNISONE 10 MG PO TABS  Take 60mg  x 3 days, then 40mg  x 3 days, then 20mg  x 3 days, then 10mg  x 2days then stop-QS 38 tablet 0    Please deliver  . SIMVASTATIN 20 MG PO TABS Oral Take 1 tablet (20 mg total) by mouth at bedtime. 30 tablet 4    Labs: CMP     Component Value Date/Time   NA 142 02/26/2011 0900   K 4.9 02/26/2011 0900   CL 103 02/26/2011 0900   CO2 31 02/26/2011 0900   GLUCOSE 98 02/26/2011 0900   BUN 20 02/26/2011 0900   CREATININE 0.87 02/26/2011 0900   CREATININE 0.86 09/06/2009 1045   CALCIUM 10.1 02/26/2011 0900   PROT 6.9 02/26/2011 0900   ALBUMIN 4.5  02/26/2011 0900   AST 14 02/26/2011 0900   ALT 13 02/26/2011 0900   ALKPHOS 67 02/26/2011 0900   BILITOT 0.3 02/26/2011 0900   GFRNONAA >60 09/06/2009 1045   GFRAA  Value: >60        The eGFR has been calculated using the MDRD equation. This calculation has not been validated in all clinical situations. eGFR's persistently <60 mL/min signify possible Chronic Kidney Disease. 09/06/2009 1045    Lipid Panel     Component Value Date/Time   CHOL 207* 02/26/2011 0900   TRIG 218* 02/26/2011 0900   HDL 59 02/26/2011 0900   CHOLHDL 3.5 02/26/2011 0900   VLDL 44* 02/26/2011 0900   LDLCALC 104* 02/26/2011 0900     Lab Results  Component Value Date   HGBA1C 6.2* 02/26/2011   HGBA1C 6.0* 09/27/2010   HGBA1C 6.1* 07/21/2009   Lab Results  Component Value Date   LDLCALC 104* 02/26/2011   CREATININE 0.87 02/26/2011     Lifestyle/ social habits: Ms. Dewan lives in [place] with her oldest daughter. 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  She reports she enjoys working more than "sitting around". She reports her stress level as a 5, citing tardiness and caring for her dogs as her main source of stress. She has had multiple traumas in her life, including her husband walking out on her and the deaths of two of her two children within the past 6 years. She smokes 1/2 PPD, but is attempting to quit; reports she is decreasing 1 cigarette per month.   Nutrition hx/habits: Ms. Hissong is concerned about her blood sugars. She reports she was told she was pre-diabetic at her last PCP visit. She states that she was given a diet handout, but "it was confusing". She reports that she has been trying to follow the instructions on the handout to the best of ability. She reports she is eating more fruits and vegetables. She has started walking 30 minutes daily. She has started using sugar substitute. She eats 2 meals a day and skips breakfast since she wakes up around 10-11 AM. She also reports to snacking at night due to hunger. She and her daughter share cooking responsibilities. She eats out once a month, usually at a buffet.   Diet recall: 11 AM: 2 cups of coffee with cream and sugar substitute, oatmeal cookie; 1 PM: toast or homemade buttermilk biscuit, bacon, egg, gravy, water/diet soda; Dinner: hamburger helper OR fresh vegetables; Snack: celery.   Nutrition Diagnosis: Inconsistent carbohydrate intake r/t disordered eating pattern, nutrition-related knowledge deficit AEB Hgb A1c: 6.2.   Nutrition Intervention: Nutrition rx: 1200 kcal NAS, diabetic diet; 3 meals per day; limit 1 starch per meal; low calorie beverages only; 30 minutes physical activity daily  Education/Counseling Provided: Educated pt on diabetic principles. Emphasized plate method, sources of carbohydrate, and portion sizes. Discussed importance of eating 3 meals daily to prevent hunger at night time. Discussed healthy cooking methods and healthier snack and breakfast alternatives.  Encouraged high fiber foods, and increased fruit and vegetable consumption. Discussed importance of regular physical activity to improve glycemic control and assist with weight loss. Provided plate method handout.   Understanding, Motivation, Ability to Follow Recommendations: Expect fair to good compliance.   Monitoring and Evaluation: Goals: 1) 1-2# weight loss per week; 2) 3 meals daily; 3) 30 minutes physical activity daily  Recommendations: 1) For weight loss: 1610-9604 kcals daily; 2) Break up  exercise into smaller, more frequent sessions  F/U: PRN. Provided RD contact information.   Orlene Plum, RD  03/26/2011  Time: 1:30 PM

## 2011-04-04 NOTE — Procedures (Signed)
NAMEJEANNE, DIEFENDORF                ACCOUNT NO.:  [id number]  MEDICAL RECORD NO.:  [id number]  LOCATION:                                 FACILITY:  PHYSICIAN:  Farhad Burleson L. Juanetta Gosling, M.D.DATE OF BIRTH:  02/22/1945  DATE OF PROCEDURE: DATE OF DISCHARGE:                           PULMONARY FUNCTION TEST   Reason for pulmonary function testing is cough. 1. Spirometry shows a moderate ventilatory defect without evidence of     definite airflow obstruction except in the smaller airways. 2. Lung volumes are normal with hyperinflation. 3. DLCO is severely reduced. 4. Airway resistance is normal. 5. No definite cause of symptoms based on this pulmonary function     test.     Ramon Dredge L. Juanetta Gosling, M.D.     ELH/MEDQ  D:  04/03/2011  T:  04/04/2011  Job:  [id number]  cc:   Dr. Lodema Hong

## 2011-04-05 ENCOUNTER — Telehealth: Payer: Self-pay | Admitting: Family Medicine

## 2011-04-05 NOTE — Telephone Encounter (Signed)
Called patient and no answer. No option to leave message. Can use OTC products but no antibiotic without OV

## 2011-04-17 DIAGNOSIS — M25559 Pain in unspecified hip: Secondary | ICD-10-CM | POA: Diagnosis not present

## 2011-04-17 DIAGNOSIS — M25579 Pain in unspecified ankle and joints of unspecified foot: Secondary | ICD-10-CM | POA: Diagnosis not present

## 2011-04-17 DIAGNOSIS — M25519 Pain in unspecified shoulder: Secondary | ICD-10-CM | POA: Diagnosis not present

## 2011-05-07 ENCOUNTER — Encounter: Payer: Self-pay | Admitting: Family Medicine

## 2011-05-07 ENCOUNTER — Ambulatory Visit (INDEPENDENT_AMBULATORY_CARE_PROVIDER_SITE_OTHER): Payer: Medicare Other | Admitting: Family Medicine

## 2011-05-07 VITALS — BP 126/74 | HR 106 | Temp 98.8°F | Resp 18 | Ht 65.0 in | Wt 177.0 lb

## 2011-05-07 DIAGNOSIS — M6740 Ganglion, unspecified site: Secondary | ICD-10-CM | POA: Diagnosis not present

## 2011-05-07 DIAGNOSIS — J441 Chronic obstructive pulmonary disease with (acute) exacerbation: Secondary | ICD-10-CM

## 2011-05-07 DIAGNOSIS — J019 Acute sinusitis, unspecified: Secondary | ICD-10-CM | POA: Diagnosis not present

## 2011-05-07 DIAGNOSIS — F17200 Nicotine dependence, unspecified, uncomplicated: Secondary | ICD-10-CM | POA: Diagnosis not present

## 2011-05-07 MED ORDER — GUAIFENESIN-CODEINE 100-10 MG/5ML PO SYRP: 5.0000 mL | ORAL_SOLUTION | Freq: Three times a day (TID) | ORAL | Status: DC | PRN

## 2011-05-07 MED ORDER — DOXYCYCLINE HYCLATE 100 MG PO TABS: 100.0000 mg | ORAL_TABLET | Freq: Two times a day (BID) | ORAL | Status: AC

## 2011-05-07 NOTE — Patient Instructions (Signed)
Take the antibiotics as prescribed  Continue to work on the smoking Use the combivent three times a day as needed Pick up the steroid if you are not better F/U with Dr. Lodema Hong as previously scheduled

## 2011-05-08 ENCOUNTER — Encounter: Payer: Self-pay | Admitting: Family Medicine

## 2011-05-08 DIAGNOSIS — J019 Acute sinusitis, unspecified: Secondary | ICD-10-CM | POA: Insufficient documentation

## 2011-05-08 DIAGNOSIS — M6740 Ganglion, unspecified site: Secondary | ICD-10-CM | POA: Insufficient documentation

## 2011-05-08 LAB — PULMONARY FUNCTION TEST

## 2011-05-08 NOTE — Assessment & Plan Note (Signed)
Chronic sinus problems, per above for Copd

## 2011-05-08 NOTE — Assessment & Plan Note (Signed)
Pt has cut back some

## 2011-05-08 NOTE — Assessment & Plan Note (Addendum)
Very early mild COPD exacerbation at this time, pt does not prednisone. Will start antibiotics, combivent to be used TID as needed, prednisone was sent 1 month ago pt did not pick up script at that time- she does not want another , pt to pick up if no improvement Smoking cesssation discussed. She will not use mucinex because she thinks it caused cancer in a family member

## 2011-05-08 NOTE — Progress Notes (Signed)
  Subjective:    Patient ID: [name], female    DOB: 05/08/1945, 66 y.o.   MRN: [id number]  HPI   Cough with production x2 weeks. Cough associated with fullness in left ureter as well as nasal drainage. She is currently a smoker 5-6 cigarettes a day. History of COPD uses Combivent as needed. She also tried her nebulizer however it did not help very much yesterday. Denies fever or chills.   Cyst on foot- present for some time, has had ortho look at this, told it was not big enough to remove Review of Systems  GEN- + fatigue, fever, weight loss,weakness, recent illness HEENT- denies eye drainage, change in vision, +nasal discharge, CVS- denies chest pain, palpitations RESP- denies SOB, +cough, +wheeze ABD- denies N/V, change in stools, abd pain GU- denies dysuria, hematuria, dribbling, incontinence MSK- denies joint pain, muscle aches, injury Neuro- denies headache, dizziness, syncope, seizure activity       Objective:   Physical Exam GEN- NAD, alert and oriented x3 HEENT- PERRL, EOMI, non injected sclera, pink conjunctiva, MMM, oropharynx- mild injection, TM clear bilat, nares clear rhinorrhea, +sinus pressure Neck- Supple, no LAD CVS- RRR, no murmur RESP-Good air movement, upper airway congestion, no wheeze, normal WOB EXT- No edema Pulses- Radial, DP- 2+ Skin- very small fullness over dorsum of right mid foot,NT      Assessment & Plan:

## 2011-05-08 NOTE — Assessment & Plan Note (Signed)
I think this is likely a small ganglion cyst, seen by surgery

## 2011-05-25 ENCOUNTER — Other Ambulatory Visit: Payer: Self-pay | Admitting: Family Medicine

## 2011-05-29 DIAGNOSIS — M25579 Pain in unspecified ankle and joints of unspecified foot: Secondary | ICD-10-CM | POA: Diagnosis not present

## 2011-05-29 DIAGNOSIS — M25559 Pain in unspecified hip: Secondary | ICD-10-CM | POA: Diagnosis not present

## 2011-05-29 DIAGNOSIS — M25519 Pain in unspecified shoulder: Secondary | ICD-10-CM | POA: Diagnosis not present

## 2011-06-12 ENCOUNTER — Other Ambulatory Visit: Payer: Self-pay | Admitting: Family Medicine

## 2011-06-20 ENCOUNTER — Other Ambulatory Visit: Payer: Self-pay | Admitting: Family Medicine

## 2011-06-23 ENCOUNTER — Other Ambulatory Visit: Payer: Self-pay | Admitting: Family Medicine

## 2011-07-17 DIAGNOSIS — M25569 Pain in unspecified knee: Secondary | ICD-10-CM | POA: Diagnosis not present

## 2011-07-17 DIAGNOSIS — M25519 Pain in unspecified shoulder: Secondary | ICD-10-CM | POA: Diagnosis not present

## 2011-07-17 DIAGNOSIS — M25579 Pain in unspecified ankle and joints of unspecified foot: Secondary | ICD-10-CM | POA: Diagnosis not present

## 2011-08-09 DIAGNOSIS — S63509A Unspecified sprain of unspecified wrist, initial encounter: Secondary | ICD-10-CM | POA: Diagnosis not present

## 2011-08-09 DIAGNOSIS — M1710 Unilateral primary osteoarthritis, unspecified knee: Secondary | ICD-10-CM | POA: Diagnosis not present

## 2011-08-09 DIAGNOSIS — M545 Low back pain: Secondary | ICD-10-CM | POA: Diagnosis not present

## 2011-08-27 DIAGNOSIS — E785 Hyperlipidemia, unspecified: Secondary | ICD-10-CM | POA: Diagnosis not present

## 2011-08-27 DIAGNOSIS — R7301 Impaired fasting glucose: Secondary | ICD-10-CM | POA: Diagnosis not present

## 2011-08-28 LAB — HEMOGLOBIN A1C
Hgb A1c MFr Bld: 6.7 % — ABNORMAL HIGH (ref <5.7)
Mean Plasma Glucose: 146 mg/dL — ABNORMAL HIGH (ref <117)

## 2011-08-28 LAB — COMPLETE METABOLIC PANEL WITH GFR
ALT: 14 U/L (ref 0–35)
AST: 15 U/L (ref 0–37)
Alkaline Phosphatase: 66 U/L (ref 39–117)
Creat: 0.95 mg/dL (ref 0.50–1.10)
Total Bilirubin: 0.4 mg/dL (ref 0.3–1.2)

## 2011-08-28 LAB — LIPID PANEL
HDL: 46 mg/dL (ref >39)
LDL Cholesterol: 89 mg/dL (ref 0–99)
Total CHOL/HDL Ratio: 3.9 Ratio
Triglycerides: 232 mg/dL — ABNORMAL HIGH (ref <150)
VLDL: 46 mg/dL — ABNORMAL HIGH (ref 0–40)

## 2011-08-30 DIAGNOSIS — S63509A Unspecified sprain of unspecified wrist, initial encounter: Secondary | ICD-10-CM | POA: Diagnosis not present

## 2011-08-30 DIAGNOSIS — M25519 Pain in unspecified shoulder: Secondary | ICD-10-CM | POA: Diagnosis not present

## 2011-08-30 DIAGNOSIS — M25559 Pain in unspecified hip: Secondary | ICD-10-CM | POA: Diagnosis not present

## 2011-08-30 DIAGNOSIS — M25569 Pain in unspecified knee: Secondary | ICD-10-CM | POA: Diagnosis not present

## 2011-09-03 ENCOUNTER — Ambulatory Visit: Payer: Medicare Other | Admitting: Family Medicine

## 2011-09-13 ENCOUNTER — Encounter: Payer: Self-pay | Admitting: Family Medicine

## 2011-09-13 ENCOUNTER — Ambulatory Visit (INDEPENDENT_AMBULATORY_CARE_PROVIDER_SITE_OTHER): Payer: Medicare Other | Admitting: Family Medicine

## 2011-09-13 VITALS — BP 130/82 | HR 101 | Resp 16 | Ht 65.0 in | Wt 184.0 lb

## 2011-09-13 DIAGNOSIS — J0100 Acute maxillary sinusitis, unspecified: Secondary | ICD-10-CM

## 2011-09-13 DIAGNOSIS — J449 Chronic obstructive pulmonary disease, unspecified: Secondary | ICD-10-CM

## 2011-09-13 DIAGNOSIS — R7303 Prediabetes: Secondary | ICD-10-CM

## 2011-09-13 DIAGNOSIS — R7309 Other abnormal glucose: Secondary | ICD-10-CM

## 2011-09-13 DIAGNOSIS — J4489 Other specified chronic obstructive pulmonary disease: Secondary | ICD-10-CM

## 2011-09-13 DIAGNOSIS — J019 Acute sinusitis, unspecified: Secondary | ICD-10-CM | POA: Diagnosis not present

## 2011-09-13 DIAGNOSIS — E119 Type 2 diabetes mellitus without complications: Secondary | ICD-10-CM | POA: Insufficient documentation

## 2011-09-13 DIAGNOSIS — F17200 Nicotine dependence, unspecified, uncomplicated: Secondary | ICD-10-CM

## 2011-09-13 DIAGNOSIS — I10 Essential (primary) hypertension: Secondary | ICD-10-CM

## 2011-09-13 DIAGNOSIS — E785 Hyperlipidemia, unspecified: Secondary | ICD-10-CM

## 2011-09-13 MED ORDER — AZITHROMYCIN 250 MG PO TABS: ORAL_TABLET | ORAL | Status: AC

## 2011-09-13 MED ORDER — METFORMIN HCL 500 MG PO TABS: 500.0000 mg | ORAL_TABLET | Freq: Every day | ORAL | Status: DC

## 2011-09-13 MED ORDER — IPRATROPIUM BROMIDE HFA 17 MCG/ACT IN AERS: 2.0000 | INHALATION_SPRAY | Freq: Four times a day (QID) | RESPIRATORY_TRACT | Status: DC

## 2011-09-13 MED ORDER — LORATADINE 10 MG PO TABS: ORAL_TABLET | ORAL | Status: DC

## 2011-09-13 MED ORDER — SIMVASTATIN 20 MG PO TABS: 20.0000 mg | ORAL_TABLET | Freq: Every day | ORAL | Status: DC

## 2011-09-13 MED ORDER — POTASSIUM CHLORIDE CRYS ER 20 MEQ PO TBCR: EXTENDED_RELEASE_TABLET | ORAL | Status: DC

## 2011-09-13 MED ORDER — BENAZEPRIL-HYDROCHLOROTHIAZIDE 20-12.5 MG PO TABS: ORAL_TABLET | ORAL | Status: DC

## 2011-09-13 NOTE — Patient Instructions (Addendum)
Annual wellness in December.  New medication for improved blood sugar control.  Please cut back on fried and fatty foods  Please cut back on cigarettes, you need to quit  Medication sent in for right maxillary sinus infection  It is important that you exercise regularly at least 30 minutes 5 times a week. If you develop chest pain, have severe difficulty breathing, or feel very tired, stop exercising immediately and seek medical attention  You need to lose weight to improve health  HBA1C and chem 7 non fasting in December

## 2011-09-17 NOTE — Assessment & Plan Note (Signed)
Controlled, no change in medication DASH diet and commitment to daily physical activity for a minimum of 30 minutes discussed and encouraged, as a part of hypertension management. The importance of attaining a healthy weight is also discussed.  

## 2011-09-17 NOTE — Progress Notes (Signed)
  Subjective:    Patient ID: [name], female    DOB: 08/03/1945, 66 y.o.   MRN: [id number]  HPI The PT is here for follow up and re-evaluation of chronic medical conditions, medication management and review of any available recent lab and radiology data.  Preventive health is updated, specifically  Cancer screening and Immunization.   Questions or concerns regarding consultations or procedures which the PT has had in the interim are  addressed. The PT denies any adverse reactions to current medications since the last visit.  C/o maxillary sinus pressure with increased yellow drainage, denies fever , has a had chills and slight increase in cough  Reports modifying eating habits, but no commitment to regular exercise, c/o knee pain which she regularly sees ortho for    Review of Systems See HPI Denies recent fever or chills. . Denies chest congestion, productive cough or wheezing. Denies chest pains, palpitations and leg swelling Denies abdominal pain, nausea, vomiting,diarrhea or constipation.   Denies dysuria, frequency, hesitancy or incontinence.  Denies headaches, seizures, numbness, or tingling. Denies depression, anxiety or insomnia. Denies skin break down or rash.        Objective:   Physical Exam Patient alert and oriented and in no cardiopulmonary distress.  HEENT: No facial asymmetry, EOMI, maxillary sinus tenderness,  oropharynx pink and moist.  Neck supple no adenopathy.  Chest: Clear to auscultation bilaterally.Decreased air entry throughout  CVS: S1, S2 no murmurs, no S3.  ABD: Soft non tender. Bowel sounds normal.  Ext: No edema  MS: Adequate though reduced  ROM spine, shoulders, hips and knees.  Skin: Intact, no ulcerations or rash noted.  Psych: Good eye contact, normal affect. Memory intact not anxious or depressed appearing.  CNS: CN 2-12 intact, power, tone and sensation normal throughout. Maxillary      Assessment & Plan:

## 2011-09-17 NOTE — Assessment & Plan Note (Signed)
Unchanged, cessation counseling done , no commitment to quitting at this time 

## 2011-09-17 NOTE — Assessment & Plan Note (Signed)
Deteriorated with increase in triglycerides, low fat diet discussed and encouraged. Continue meds

## 2011-09-17 NOTE — Assessment & Plan Note (Signed)
Acute infection, antibiotics prescribed 

## 2011-09-17 NOTE — Assessment & Plan Note (Signed)
Deteriorated, has been to nutritionist , which she found helpful. Needs to increase physical activity, start metformin

## 2011-10-10 ENCOUNTER — Telehealth: Payer: Self-pay | Admitting: Family Medicine

## 2011-10-10 NOTE — Telephone Encounter (Signed)
Is having bloating with the metformin and stomach pain. Advised her that its one of the side effects but it becomes too bothersome to call back. Pt agrees

## 2011-10-12 ENCOUNTER — Other Ambulatory Visit: Payer: Self-pay

## 2011-10-12 MED ORDER — GLUCOSE BLOOD VI STRP: ORAL_STRIP | Status: DC

## 2011-10-12 MED ORDER — ONETOUCH DELICA LANCETS MISC: Status: DC

## 2011-10-15 ENCOUNTER — Telehealth: Payer: Self-pay | Admitting: Family Medicine

## 2011-10-15 NOTE — Telephone Encounter (Signed)
This has already been sent in. Pharmacy confirmed they have received it

## 2011-10-18 ENCOUNTER — Ambulatory Visit (INDEPENDENT_AMBULATORY_CARE_PROVIDER_SITE_OTHER): Payer: Medicare Other | Admitting: Family Medicine

## 2011-10-18 ENCOUNTER — Encounter: Payer: Self-pay | Admitting: Family Medicine

## 2011-10-18 VITALS — BP 122/78 | HR 88 | Temp 98.4°F | Resp 18 | Wt 178.0 lb

## 2011-10-18 DIAGNOSIS — R5381 Other malaise: Secondary | ICD-10-CM

## 2011-10-18 DIAGNOSIS — F17200 Nicotine dependence, unspecified, uncomplicated: Secondary | ICD-10-CM | POA: Diagnosis not present

## 2011-10-18 DIAGNOSIS — R5383 Other fatigue: Secondary | ICD-10-CM | POA: Diagnosis not present

## 2011-10-18 DIAGNOSIS — R7303 Prediabetes: Secondary | ICD-10-CM

## 2011-10-18 DIAGNOSIS — H6692 Otitis media, unspecified, left ear: Secondary | ICD-10-CM | POA: Insufficient documentation

## 2011-10-18 DIAGNOSIS — I10 Essential (primary) hypertension: Secondary | ICD-10-CM | POA: Diagnosis not present

## 2011-10-18 DIAGNOSIS — H6690 Otitis media, unspecified, unspecified ear: Secondary | ICD-10-CM | POA: Diagnosis not present

## 2011-10-18 DIAGNOSIS — R7301 Impaired fasting glucose: Secondary | ICD-10-CM

## 2011-10-18 DIAGNOSIS — E785 Hyperlipidemia, unspecified: Secondary | ICD-10-CM

## 2011-10-18 DIAGNOSIS — J0100 Acute maxillary sinusitis, unspecified: Secondary | ICD-10-CM | POA: Diagnosis not present

## 2011-10-18 DIAGNOSIS — J42 Unspecified chronic bronchitis: Secondary | ICD-10-CM

## 2011-10-18 DIAGNOSIS — M159 Polyosteoarthritis, unspecified: Secondary | ICD-10-CM

## 2011-10-18 DIAGNOSIS — R7309 Other abnormal glucose: Secondary | ICD-10-CM

## 2011-10-18 MED ORDER — ERYTHROMYCIN BASE 500 MG PO TABS: 500.0000 mg | ORAL_TABLET | Freq: Three times a day (TID) | ORAL | Status: DC

## 2011-10-18 MED ORDER — BENZONATATE 100 MG PO CAPS: 100.0000 mg | ORAL_CAPSULE | Freq: Four times a day (QID) | ORAL | Status: DC | PRN

## 2011-10-18 NOTE — Progress Notes (Signed)
  Subjective:    Patient ID: [name], female    DOB: 1945/12/23, 66 y.o.   MRN: [id number]  HPI  1 week h/o left ear pain and sinus pressure with yellow drainage and cough productive of yellow sputum, still smoking, hopes to quit , but unwilling to set a quit date. Prior to this has been fairly well, working on dietary management of diabetes, as well as compliant with medication and blood sugar testing, all of which is new for her. Fasting sugars are seldom over 120, denies hypoglycemic episodes, polyuria or polydipsia, she has been to diabetic ed class , which she found useful  Review of Systems See HPI  Denies chest pains, palpitations and leg swelling Denies abdominal pain, nausea, vomiting,diarrhea or constipation.   Denies dysuria, frequency, hesitancy or incontinence. Denies joint pain, swelling and limitation in mobility. Denies headaches, seizures, numbness, or tingling. Denies depression,uncontrolled  anxiety or insomnia. Denies skin break down or rash.        Objective:   Physical Exam Patient alert and oriented and in no cardiopulmonary distress.  HEENT: No facial asymmetry, EOMI, maxillary  sinus tenderness,  oropharynx pink and moist.  Neck supple bilateral  Adenopathy.Left TM erythematous and dull  Chest: decreased air entry, scattered crackles and wheezes  CVS: S1, S2 no murmurs, no S3.  ABD: Soft non tender. Bowel sounds normal.  Ext: No edema  MS: Adequate ROM spine, shoulders, hips and knees.  Skin: Intact, no ulcerations or rash noted.  Psych: Good eye contact, normal affect. Memory intact not anxious or depressed appearing.  CNS: CN 2-12 intact, power, tone and sensation normal throughout.  Diabetic Foot Check:  Appearance - no lesions, ulcers or calluses Skin - no unusual pallor or redness Sensation - grossly intact to light touch Monofilament testing -  Right - Great toe, medial, central, lateral ball and posterior foot intact Left -  Great toe, medial, central, lateral ball and posterior foot intact Pulses Left - Dorsalis Pedis and Posterior Tibia normal Right - Dorsalis Pedis and Posterior Tibia normal       Assessment & Plan:

## 2011-10-18 NOTE — Patient Instructions (Addendum)
F/u 2nd week December, please call if you need me before  Please schedule flu vaccine for 10/17 in the pm  You are treated for sinusitis and bronchitis   Non fasting cbc, tSH, HBa1C, chem 7 and Vit D and microalb before next visit please    Please think about quitting smoking.  This is very important for your health.  Consider setting a quit date, then cutting back or switching brands to prepare to stop.  Also think of the money you will save every day by not smoking.  Quick Tips to Quit Smoking: Fix a date i.e. keep a date in mind from when you would not touch a tobacco product to smoke  Keep yourself busy and block your mind with work loads or reading books or watching movies in malls where smoking is not allowed  Vanish off the things which reminds you about smoking for example match box, or your favorite lighter, or the pipe you used for smoking, or your favorite jeans and shirt with which you used to enjoy smoking, or the club where you used to do smoking  Try to avoid certain people places and incidences where and with whom smoking is a common factor to add on  Praise yourself with some token gifts from the money you saved by stopping smoking  Anti Smoking teams are there to help you. Join their programs  Anti-smoking Gums are there in many medical shops. Try them to quit smoking   Side-effects of Smoking: Disease caused by smoking cigarettes are emphysema, bronchitis, heart failures  Premature death  Cancer is the major side effect of smoking  Heart attacks and strokes are the quick effects of smoking causing sudden death  Some smokers lives end up with limbs amputated  Breathing problem or fast breathing is another side effect of smoking  Due to more intakes of smokes, carbon mono-oxide goes into your brain and other muscles of the body which leads to swelling of the veins and blockage to the air passage to lungs  Carbon monoxide blocks blood vessels which leads to blockage in  the flow of blood to different major body organs like heart lungs and thus leads to attacks and deaths  During pregnancy smoking is very harmful and leads to premature birth of the infant, spontaneous abortions, low weight of the infant during birth  Fat depositions to narrow and blocked blood vessels causing heart attacks  In many cases cigarette smoking caused infertility in men

## 2011-10-19 MED ORDER — SIMVASTATIN 20 MG PO TABS: 20.0000 mg | ORAL_TABLET | Freq: Every day | ORAL | Status: DC

## 2011-10-20 NOTE — Assessment & Plan Note (Signed)
Acute flare with sputum production, antibiotic prescribed, decongestant and cough suppressant

## 2011-10-20 NOTE — Assessment & Plan Note (Signed)
Acute infection antibiotic course prescribed 

## 2011-10-20 NOTE — Assessment & Plan Note (Signed)
Controlled, no change in medication DASH diet and commitment to daily physical activity for a minimum of 30 minutes discussed and encouraged, as a part of hypertension management. The importance of attaining a healthy weight is also discussed.  

## 2011-10-20 NOTE — Assessment & Plan Note (Signed)
Uncontrolled No med change Hyperlipidemia:Low fat diet discussed and encouraged.   

## 2011-10-20 NOTE — Assessment & Plan Note (Addendum)
Taking dx seriously, has lost weight and is compliant with diet and blood sugar testing, no adverse effect from metformin

## 2011-10-20 NOTE — Assessment & Plan Note (Signed)
Trying to cut back, no quit date set. Patient counseled for approximately 5 minutes regarding the health risks of ongoing nicotine use, specifically all types of cancer, heart disease, stroke and respiratory failure. The options available for help with cessation ,the behavioral changes to assist the process, and the option to either gradully reduce usage  Or abruptly stop.is also discussed. Pt is also encouraged to set specific goals in number of cigarettes used daily, as well as to set a quit date.

## 2011-10-30 DIAGNOSIS — S63509A Unspecified sprain of unspecified wrist, initial encounter: Secondary | ICD-10-CM | POA: Diagnosis not present

## 2011-10-30 DIAGNOSIS — M25559 Pain in unspecified hip: Secondary | ICD-10-CM | POA: Diagnosis not present

## 2011-10-30 DIAGNOSIS — M25519 Pain in unspecified shoulder: Secondary | ICD-10-CM | POA: Diagnosis not present

## 2011-10-31 ENCOUNTER — Ambulatory Visit (INDEPENDENT_AMBULATORY_CARE_PROVIDER_SITE_OTHER): Payer: Medicare Other

## 2011-10-31 DIAGNOSIS — Z23 Encounter for immunization: Secondary | ICD-10-CM | POA: Diagnosis not present

## 2011-11-20 ENCOUNTER — Other Ambulatory Visit: Payer: Self-pay | Admitting: Family Medicine

## 2011-12-04 ENCOUNTER — Other Ambulatory Visit: Payer: Self-pay | Admitting: Family Medicine

## 2011-12-04 DIAGNOSIS — Z139 Encounter for screening, unspecified: Secondary | ICD-10-CM

## 2011-12-04 DIAGNOSIS — C50919 Malignant neoplasm of unspecified site of unspecified female breast: Secondary | ICD-10-CM

## 2011-12-10 ENCOUNTER — Telehealth: Payer: Self-pay | Admitting: Family Medicine

## 2011-12-10 NOTE — Telephone Encounter (Signed)
She will need to be evaluated to for her ear pain

## 2011-12-10 NOTE — Telephone Encounter (Signed)
Patient was in in October for similar problem and was given ABT.  Please advise.

## 2011-12-19 ENCOUNTER — Encounter (HOSPITAL_COMMUNITY): Payer: Medicare Other

## 2011-12-25 DIAGNOSIS — I10 Essential (primary) hypertension: Secondary | ICD-10-CM | POA: Diagnosis not present

## 2011-12-25 DIAGNOSIS — R7309 Other abnormal glucose: Secondary | ICD-10-CM | POA: Diagnosis not present

## 2011-12-26 ENCOUNTER — Encounter (HOSPITAL_COMMUNITY): Payer: Medicare Other

## 2011-12-26 LAB — HEMOGLOBIN A1C
Hgb A1c MFr Bld: 6.3 % — ABNORMAL HIGH (ref <5.7)
Mean Plasma Glucose: 134 mg/dL — ABNORMAL HIGH (ref <117)

## 2011-12-26 LAB — BASIC METABOLIC PANEL
CO2: 25 mEq/L (ref 19–32)
Calcium: 10.3 mg/dL (ref 8.4–10.5)
Sodium: 139 mEq/L (ref 135–145)

## 2011-12-31 ENCOUNTER — Encounter: Payer: Self-pay | Admitting: Family Medicine

## 2011-12-31 ENCOUNTER — Ambulatory Visit (INDEPENDENT_AMBULATORY_CARE_PROVIDER_SITE_OTHER): Payer: Medicare Other | Admitting: Family Medicine

## 2011-12-31 VITALS — BP 110/74 | HR 96 | Resp 16 | Ht 65.0 in | Wt 176.0 lb

## 2011-12-31 DIAGNOSIS — E1169 Type 2 diabetes mellitus with other specified complication: Secondary | ICD-10-CM

## 2011-12-31 DIAGNOSIS — J42 Unspecified chronic bronchitis: Secondary | ICD-10-CM | POA: Diagnosis not present

## 2011-12-31 DIAGNOSIS — R7301 Impaired fasting glucose: Secondary | ICD-10-CM

## 2011-12-31 DIAGNOSIS — E669 Obesity, unspecified: Secondary | ICD-10-CM | POA: Diagnosis not present

## 2011-12-31 DIAGNOSIS — F17200 Nicotine dependence, unspecified, uncomplicated: Secondary | ICD-10-CM

## 2011-12-31 DIAGNOSIS — R5383 Other fatigue: Secondary | ICD-10-CM

## 2011-12-31 DIAGNOSIS — I10 Essential (primary) hypertension: Secondary | ICD-10-CM

## 2011-12-31 DIAGNOSIS — E785 Hyperlipidemia, unspecified: Secondary | ICD-10-CM | POA: Diagnosis not present

## 2011-12-31 DIAGNOSIS — E119 Type 2 diabetes mellitus without complications: Secondary | ICD-10-CM | POA: Diagnosis not present

## 2011-12-31 DIAGNOSIS — R5381 Other malaise: Secondary | ICD-10-CM

## 2011-12-31 MED ORDER — BENZONATATE 100 MG PO CAPS: 100.0000 mg | ORAL_CAPSULE | Freq: Four times a day (QID) | ORAL | Status: DC | PRN

## 2011-12-31 MED ORDER — SIMVASTATIN 20 MG PO TABS: 20.0000 mg | ORAL_TABLET | Freq: Every day | ORAL | Status: DC

## 2011-12-31 MED ORDER — ERYTHROMYCIN BASE 250 MG PO TABS: 250.0000 mg | ORAL_TABLET | Freq: Four times a day (QID) | ORAL | Status: DC

## 2011-12-31 MED ORDER — LORATADINE 10 MG PO TABS: ORAL_TABLET | ORAL | Status: DC

## 2011-12-31 MED ORDER — BENAZEPRIL-HYDROCHLOROTHIAZIDE 20-12.5 MG PO TABS: ORAL_TABLET | ORAL | Status: DC

## 2011-12-31 NOTE — Patient Instructions (Addendum)
F/u in 4 month Please call if you need me before  Erythromycin and tessalon perles are sent in for cough and chest congestion  You NEED to stop smoking please work on cutting back with a view to quitting  You are referred for diabetic eye exam  Microalb today  Fasting lipid, cmp and EGFR, hBA1C cbc and TSH in 4 month, before visit

## 2011-12-31 NOTE — Progress Notes (Signed)
  Subjective:    Patient ID: [name], female    DOB: [birth year], 66 y.o.   MRN: [id number]  HPI The PT is here for follow up and re-evaluation of chronic medical conditions, medication management and review of any available recent lab and radiology data.  Preventive health is updated, specifically  Cancer screening and Immunization.   Questions or concerns regarding consultations or procedures which the PT has had in the interim are  addressed. The PT denies any adverse reactions to current medications since the last visit.  1 month h/o cough and chest congestion, thick yellow sputum and intermittent chills      Review of Systems See HPI Denies sinus pressure, nasal congestion, ear pain or sore throat. Denies chest pains, palpitations and leg swelling Denies abdominal pain, nausea, vomiting,diarrhea or constipation.   Denies dysuria, frequency, hesitancy or incontinence. Denies joint pain, swelling and limitation in mobility. Denies headaches, seizures, numbness, or tingling. Denies depression, anxiety or insomnia. Denies skin break down or rash.        Objective:   Physical Exam  Patient alert and oriented and in no cardiopulmonary distress.  HEENT: No facial asymmetry, EOMI, no sinus tenderness,  oropharynx pink and moist.  Neck supple no adenopathy.  Chest: decreased air entry, scattered crackles and wheezes  CVS: S1, S2 no murmurs, no S3.  ABD: Soft non tender. Bowel sounds normal.  Ext: No edema  MS: Adequate ROM spine, shoulders, hips and knees.  Skin: Intact, no ulcerations or rash noted.  Psych: Good eye contact, normal affect. Memory intact not anxious or depressed appearing.  CNS: CN 2-12 intact, power, tone and sensation normal throughout.      Assessment & Plan:

## 2012-01-01 ENCOUNTER — Telehealth: Payer: Self-pay | Admitting: Family Medicine

## 2012-01-01 NOTE — Telephone Encounter (Signed)
Noted  

## 2012-01-02 ENCOUNTER — Encounter (HOSPITAL_COMMUNITY): Payer: Medicare Other

## 2012-01-02 NOTE — Telephone Encounter (Signed)
Patient has since had appointment.

## 2012-01-03 ENCOUNTER — Encounter (HOSPITAL_COMMUNITY): Payer: Medicare Other

## 2012-01-11 ENCOUNTER — Encounter (HOSPITAL_COMMUNITY): Payer: Medicare Other

## 2012-01-15 DIAGNOSIS — M25559 Pain in unspecified hip: Secondary | ICD-10-CM | POA: Diagnosis not present

## 2012-01-15 DIAGNOSIS — S63509A Unspecified sprain of unspecified wrist, initial encounter: Secondary | ICD-10-CM | POA: Diagnosis not present

## 2012-01-15 DIAGNOSIS — M25519 Pain in unspecified shoulder: Secondary | ICD-10-CM | POA: Diagnosis not present

## 2012-01-22 DIAGNOSIS — H2510 Age-related nuclear cataract, unspecified eye: Secondary | ICD-10-CM | POA: Diagnosis not present

## 2012-01-27 NOTE — Assessment & Plan Note (Signed)
Continues to smoke but cutting back. Patient counseled for approximately 5 minutes regarding the health risks of ongoing nicotine use, specifically all types of cancer, heart disease, stroke and respiratory failure. The options available for help with cessation ,the behavioral changes to assist the process, and the option to either gradully reduce usage  Or abruptly stop.is also discussed. Pt is also encouraged to set specific goals in number of cigarettes used daily, as well as to set a quit date.

## 2012-01-27 NOTE — Assessment & Plan Note (Signed)
acute flare antibiotics and decongestants prescribed

## 2012-01-27 NOTE — Assessment & Plan Note (Signed)
Controlled, no change in medication Patient advised to reduce carb and sweets, commit to regular physical activity, take meds as prescribed, test blood as directed, and attempt to lose weight, to improve blood sugar control.  

## 2012-01-27 NOTE — Assessment & Plan Note (Signed)
Updated lab next visit. Controlled when last checked. Continue current med

## 2012-01-28 ENCOUNTER — Ambulatory Visit (INDEPENDENT_AMBULATORY_CARE_PROVIDER_SITE_OTHER): Payer: Medicare Other | Admitting: Family Medicine

## 2012-01-28 ENCOUNTER — Ambulatory Visit (HOSPITAL_COMMUNITY)
Admission: RE | Admit: 2012-01-28 | Discharge: 2012-01-28 | Disposition: A | Discharge status: Home / Self Care | Payer: Medicare Other | Source: Ambulatory Visit | Attending: Family Medicine | Admitting: Family Medicine

## 2012-01-28 ENCOUNTER — Encounter: Payer: Self-pay | Admitting: Family Medicine

## 2012-01-28 VITALS — BP 124/78 | HR 91 | Resp 16 | Ht 65.0 in | Wt 174.0 lb

## 2012-01-28 DIAGNOSIS — H9209 Otalgia, unspecified ear: Secondary | ICD-10-CM

## 2012-01-28 DIAGNOSIS — Z111 Encounter for screening for respiratory tuberculosis: Secondary | ICD-10-CM | POA: Diagnosis not present

## 2012-01-28 DIAGNOSIS — R0600 Dyspnea, unspecified: Secondary | ICD-10-CM

## 2012-01-28 DIAGNOSIS — F17200 Nicotine dependence, unspecified, uncomplicated: Secondary | ICD-10-CM

## 2012-01-28 DIAGNOSIS — J449 Chronic obstructive pulmonary disease, unspecified: Secondary | ICD-10-CM

## 2012-01-28 DIAGNOSIS — J42 Unspecified chronic bronchitis: Secondary | ICD-10-CM

## 2012-01-28 DIAGNOSIS — Z23 Encounter for immunization: Secondary | ICD-10-CM

## 2012-01-28 DIAGNOSIS — R0989 Other specified symptoms and signs involving the circulatory and respiratory systems: Secondary | ICD-10-CM

## 2012-01-28 DIAGNOSIS — R059 Cough, unspecified: Secondary | ICD-10-CM | POA: Insufficient documentation

## 2012-01-28 DIAGNOSIS — R05 Cough: Secondary | ICD-10-CM | POA: Insufficient documentation

## 2012-01-28 DIAGNOSIS — F4323 Adjustment disorder with mixed anxiety and depressed mood: Secondary | ICD-10-CM | POA: Diagnosis not present

## 2012-01-28 DIAGNOSIS — N390 Urinary tract infection, site not specified: Secondary | ICD-10-CM | POA: Diagnosis not present

## 2012-01-28 DIAGNOSIS — S81812A Laceration without foreign body, left lower leg, initial encounter: Secondary | ICD-10-CM

## 2012-01-28 DIAGNOSIS — J4489 Other specified chronic obstructive pulmonary disease: Secondary | ICD-10-CM

## 2012-01-28 DIAGNOSIS — E119 Type 2 diabetes mellitus without complications: Secondary | ICD-10-CM

## 2012-01-28 DIAGNOSIS — H9202 Otalgia, left ear: Secondary | ICD-10-CM

## 2012-01-28 DIAGNOSIS — E669 Obesity, unspecified: Secondary | ICD-10-CM

## 2012-01-28 DIAGNOSIS — I10 Essential (primary) hypertension: Secondary | ICD-10-CM

## 2012-01-28 LAB — POCT URINALYSIS DIPSTICK
Blood, UA: NEGATIVE
Ketones, UA: NEGATIVE
Protein, UA: NEGATIVE
Spec Grav, UA: 1.01
Urobilinogen, UA: 0.2
pH, UA: 6

## 2012-01-28 MED ORDER — BENZONATATE 100 MG PO CAPS: 100.0000 mg | ORAL_CAPSULE | Freq: Four times a day (QID) | ORAL | Status: DC | PRN

## 2012-01-28 MED ORDER — LEVOFLOXACIN 500 MG PO TABS: 500.0000 mg | ORAL_TABLET | Freq: Every day | ORAL | Status: AC

## 2012-01-28 MED ORDER — IPRATROPIUM BROMIDE 0.02 % IN SOLN
0.5000 mg | Freq: Once | RESPIRATORY_TRACT | Status: AC
  Administered 2012-01-28: 0.5 mg via RESPIRATORY_TRACT

## 2012-01-28 MED ORDER — ALBUTEROL SULFATE (2.5 MG/3ML) 0.083% IN NEBU
2.5000 mg | INHALATION_SOLUTION | Freq: Once | RESPIRATORY_TRACT | Status: AC
  Administered 2012-01-28: 2.5 mg via RESPIRATORY_TRACT

## 2012-01-28 NOTE — Patient Instructions (Addendum)
F//u in 6 weeks.  You appear to have a urinary tract infection, levaquin is prescribed.  CXR today for chronic cough and TB skin test placement  Please bring in sputum to be sent to the lab for testing when you return, this week , if still thick and yellow.  I will refer you to ENT, Dr Suszanne Conners for evaluation of chronic left ear pain.  I believe the cough is mainly from cigarette smoking and COPD, you really do need to stop smoking  I will attempt to get a chest CT scan due to your symptoms and increased risk of lung cancer because of this  I will refer you for lung function tests to assess COPD, last done in 2013, will hold on this  TdaP today since you have a cut on the left leg where you shaved and cut your  Left leg

## 2012-01-28 NOTE — Progress Notes (Signed)
  Subjective:    Patient ID: [name], female    DOB: September 07, 1945, 67 y.o.   MRN: [id number]  HPI 2 week h/o suprapubic pressure and frequency, also bilateral flank pain, intermittent chills in the past week  Cough and chest congestion last week pt got wet in the rain, intermittent chills in the past week , sputum clear, yellow and black  Frontal sinus pressure early morning nasal drainage at times is yellow  Continues to have left ear pain Continues to smoke   Review of Systems See HPI  Denies chest pains, palpitations and leg swelling Denies abdominal pain, nausea, vomiting,diarrhea or constipation.   . Denies joint pain, swelling and limitation in mobility. Denies headaches, seizures, numbness, or tingling. Denies depression, anxiety or insomnia. C/o laceration on left leg sustained 1 day ago while shaving      Objective:   Physical Exam Patient alert and oriented and in no cardiopulmonary distress.  HEENT: No facial asymmetry, EOMI, frontal  sinus tenderness,  oropharynx pink and moist.  Neck supple no adenopathy.Left TM dull  Chest: decreased air entry, bilateral crackles, few wheezes  CVS: S1, S2 no murmurs, no S3.  ABD: Soft , no renal angle tenderness, mild supra pubic tenderness. Ext: No edema  MS: Adequate ROM spine, shoulders, hips and knees.  Skin: Iaceration on left leg, approx 3 inches long, mild erythema , no purulent drainage  Psych: Good eye contact, normal affect. Memory intact not anxious or depressed appearing.  CNS: CN 2-12 intact, power, tone and sensation normal throughout.        Assessment & Plan:

## 2012-01-30 ENCOUNTER — Ambulatory Visit (HOSPITAL_COMMUNITY)
Admission: RE | Admit: 2012-01-30 | Discharge: 2012-01-30 | Disposition: A | Discharge status: Home / Self Care | Payer: Medicare Other | Source: Ambulatory Visit | Attending: Family Medicine | Admitting: Family Medicine

## 2012-01-30 DIAGNOSIS — Z853 Personal history of malignant neoplasm of breast: Secondary | ICD-10-CM | POA: Insufficient documentation

## 2012-01-30 DIAGNOSIS — C50919 Malignant neoplasm of unspecified site of unspecified female breast: Secondary | ICD-10-CM

## 2012-01-30 DIAGNOSIS — R928 Other abnormal and inconclusive findings on diagnostic imaging of breast: Secondary | ICD-10-CM | POA: Diagnosis not present

## 2012-01-30 LAB — URINE CULTURE

## 2012-01-30 LAB — TB SKIN TEST

## 2012-01-31 DIAGNOSIS — J42 Unspecified chronic bronchitis: Secondary | ICD-10-CM | POA: Diagnosis not present

## 2012-02-01 ENCOUNTER — Other Ambulatory Visit: Payer: Self-pay

## 2012-02-01 DIAGNOSIS — R7303 Prediabetes: Secondary | ICD-10-CM

## 2012-02-01 MED ORDER — METFORMIN HCL 500 MG PO TABS: 500.0000 mg | ORAL_TABLET | Freq: Every day | ORAL | Status: DC

## 2012-02-01 MED ORDER — POTASSIUM CHLORIDE CRYS ER 20 MEQ PO TBCR: EXTENDED_RELEASE_TABLET | ORAL | Status: DC

## 2012-02-10 DIAGNOSIS — S81812A Laceration without foreign body, left lower leg, initial encounter: Secondary | ICD-10-CM | POA: Insufficient documentation

## 2012-02-10 DIAGNOSIS — H9202 Otalgia, left ear: Secondary | ICD-10-CM | POA: Insufficient documentation

## 2012-02-10 DIAGNOSIS — N390 Urinary tract infection, site not specified: Secondary | ICD-10-CM | POA: Insufficient documentation

## 2012-02-10 NOTE — Assessment & Plan Note (Signed)
Increased cpough, chest congestion and dyspnea in pt with chronic nicotine use , tobacco cessation counselling done will refer for chest ct scan based on symptoms

## 2012-02-10 NOTE — Assessment & Plan Note (Signed)
Open laceration on left leg where pt accidentally cut herself while shaving. TdAP administered, antibiotic prescribed will also cover skin infection

## 2012-02-10 NOTE — Assessment & Plan Note (Signed)
Cutting back but unwilling to set a quit date Patient counseled for approximately 5 minutes regarding the health risks of ongoing nicotine use, specifically all types of cancer, heart disease, stroke and respiratory failure. The options available for help with cessation ,the behavioral changes to assist the process, and the option to either gradully reduce usage  Or abruptly stop.is also discussed. Pt is also encouraged to set specific goals in number of cigarettes used daily, as well as to set a quit date.  

## 2012-02-10 NOTE — Assessment & Plan Note (Signed)
Cystitis symptoms with abnormal UA, will treat presumptively, and follow c/s

## 2012-02-10 NOTE — Assessment & Plan Note (Signed)
Controlled, no change in medication Patient advised to reduce carb and sweets, commit to regular physical activity, take meds as prescribed, test blood as directed, and attempt to lose weight, to improve blood sugar control.  

## 2012-02-10 NOTE — Assessment & Plan Note (Signed)
Controlled, no change in medication DASH diet and commitment to daily physical activity for a minimum of 30 minutes discussed and encouraged, as a part of hypertension management. The importance of attaining a healthy weight is also discussed.  

## 2012-02-19 ENCOUNTER — Ambulatory Visit (HOSPITAL_COMMUNITY): Payer: Medicare Other

## 2012-02-25 ENCOUNTER — Telehealth: Payer: Self-pay | Admitting: Family Medicine

## 2012-02-25 NOTE — Telephone Encounter (Signed)
No phone calls to pt today

## 2012-02-25 NOTE — Telephone Encounter (Signed)
Not noted that she has been called.

## 2012-02-27 ENCOUNTER — Encounter (HOSPITAL_COMMUNITY): Payer: Medicare Other

## 2012-02-28 ENCOUNTER — Ambulatory Visit (INDEPENDENT_AMBULATORY_CARE_PROVIDER_SITE_OTHER): Payer: Medicare Other | Admitting: Otolaryngology

## 2012-03-03 ENCOUNTER — Ambulatory Visit: Payer: Medicare Other | Admitting: Family Medicine

## 2012-03-03 ENCOUNTER — Ambulatory Visit (HOSPITAL_COMMUNITY)
Admission: RE | Admit: 2012-03-03 | Discharge: 2012-03-03 | Disposition: A | Discharge status: Home / Self Care | Payer: Medicare Other | Source: Ambulatory Visit | Attending: Family Medicine | Admitting: Family Medicine

## 2012-03-03 ENCOUNTER — Other Ambulatory Visit: Payer: Self-pay | Admitting: Family Medicine

## 2012-03-03 DIAGNOSIS — J42 Unspecified chronic bronchitis: Secondary | ICD-10-CM | POA: Diagnosis not present

## 2012-03-03 DIAGNOSIS — R918 Other nonspecific abnormal finding of lung field: Secondary | ICD-10-CM | POA: Insufficient documentation

## 2012-03-03 DIAGNOSIS — Z853 Personal history of malignant neoplasm of breast: Secondary | ICD-10-CM | POA: Insufficient documentation

## 2012-03-03 DIAGNOSIS — R0609 Other forms of dyspnea: Secondary | ICD-10-CM | POA: Insufficient documentation

## 2012-03-03 DIAGNOSIS — F17200 Nicotine dependence, unspecified, uncomplicated: Secondary | ICD-10-CM | POA: Diagnosis not present

## 2012-03-03 DIAGNOSIS — R0989 Other specified symptoms and signs involving the circulatory and respiratory systems: Secondary | ICD-10-CM | POA: Insufficient documentation

## 2012-03-03 DIAGNOSIS — J449 Chronic obstructive pulmonary disease, unspecified: Secondary | ICD-10-CM | POA: Diagnosis not present

## 2012-03-03 DIAGNOSIS — R05 Cough: Secondary | ICD-10-CM | POA: Insufficient documentation

## 2012-03-03 DIAGNOSIS — R059 Cough, unspecified: Secondary | ICD-10-CM | POA: Diagnosis not present

## 2012-03-03 DIAGNOSIS — J4489 Other specified chronic obstructive pulmonary disease: Secondary | ICD-10-CM | POA: Insufficient documentation

## 2012-03-03 DIAGNOSIS — R0600 Dyspnea, unspecified: Secondary | ICD-10-CM

## 2012-03-03 MED ORDER — ALBUTEROL SULFATE (5 MG/ML) 0.5% IN NEBU
2.5000 mg | INHALATION_SOLUTION | Freq: Once | RESPIRATORY_TRACT | Status: AC
  Administered 2012-03-03: 2.5 mg via RESPIRATORY_TRACT

## 2012-03-04 ENCOUNTER — Encounter (HOSPITAL_COMMUNITY): Payer: Medicare Other

## 2012-03-05 NOTE — Procedures (Signed)
[name], [name]                ACCOUNT NO.:  [id number]  MEDICAL RECORD NO.:  [id number]  LOCATION:  RAD                           FACILITY:  [facility]  PHYSICIAN:  Tryson Lumley L. Juanetta Gosling, M.D.DATE OF BIRTH:  1945-03-15  DATE OF PROCEDURE: DATE OF DISCHARGE:  03/03/2012                           PULMONARY FUNCTION TEST   Reason for pulmonary function testing is COPD.  1. Spirometry shows a mild ventilatory defect with evidence of airflow     obstruction. 2. Lung volumes are normal. 3. DLCO is mildly reduced. 4. Air airway resistance is high confirming the presence of airflow     obstruction. 5. There is no significant bronchodilator improvement. 6. The study is consistent with COPD.     Daneka Lantigua L. Juanetta Gosling, M.D.     [facility]  D:  03/04/2012  T:  03/05/2012  Job:  [id number]

## 2012-03-10 ENCOUNTER — Telehealth: Payer: Self-pay | Admitting: Family Medicine

## 2012-03-11 ENCOUNTER — Encounter (HOSPITAL_COMMUNITY): Payer: Self-pay | Admitting: Oncology

## 2012-03-11 ENCOUNTER — Encounter (HOSPITAL_COMMUNITY): Payer: Medicare Other | Attending: Oncology | Admitting: Oncology

## 2012-03-11 VITALS — BP 116/71 | HR 77 | Temp 98.3°F | Resp 18 | Wt 173.0 lb

## 2012-03-11 DIAGNOSIS — F17200 Nicotine dependence, unspecified, uncomplicated: Secondary | ICD-10-CM

## 2012-03-11 DIAGNOSIS — J449 Chronic obstructive pulmonary disease, unspecified: Secondary | ICD-10-CM | POA: Diagnosis not present

## 2012-03-11 DIAGNOSIS — Z853 Personal history of malignant neoplasm of breast: Secondary | ICD-10-CM | POA: Diagnosis not present

## 2012-03-11 DIAGNOSIS — R911 Solitary pulmonary nodule: Secondary | ICD-10-CM

## 2012-03-11 DIAGNOSIS — C50912 Malignant neoplasm of unspecified site of left female breast: Secondary | ICD-10-CM

## 2012-03-11 DIAGNOSIS — C50919 Malignant neoplasm of unspecified site of unspecified female breast: Secondary | ICD-10-CM | POA: Insufficient documentation

## 2012-03-11 NOTE — Progress Notes (Signed)
Tanya Overman, MD [address], [address] [place] [address]  Breast cancer, left  CURRENT THERAPY: Observation with yearly mammograms  INTERVAL HISTORY: [name] 67 y.o. female returns for  regular  visit for followup of Stage I (T1b N0 M0), grade 1 well-differentiated carcinoma of the left breast status, post lumpectomy followed by radiation therapy. Her estrogen receptor receptors were 93%, progesterone receptors 67%. HER-2/neu was negative. No lymphovascular space invasion was seen. All margins were clear. Ki-67 marker was low at 1% withsurgery on 11/15/2004. Treated then with post-lumpectomy radiation, finished as of 02/22/2005, followed by Aromasin, which she could not tolerate. We then switched her to Femara, which she finished all therapy as of January 14, 2010. She actually had started her therapy officially on 12/11/2004.    [name] is doing well from an oncologic standpoint.  She has cut back on her smoking and is transitioning to the e-cigarette.  She admits that she smokes 2-3 cigarette per day in addition to the e-cigarette.  She was seen by her PCP who performed a CT of the chest.  The Ct demonstrated a small RUL pulmonary nodule measuring 4 mm or less.  It is nonspecific.  Radiologist recommended a repeat scan in 12 months to evaluate for stability of the lung nodule.  Her PCP has referred [name] to a pulmonologist, Dr. Maple Hudson for further evaluation of this nodule.  She sees him on 03/18/2012.  Due to the patient's smoking history, a follow-up CT of chest without contrast is likely in order in 6-12 months.   I provided the patient education regarding pulmonary nodules.  She is at increased risk for not only pulmonary nodules, but also bronchogenic carcinoma due to her tobacco abuse.  96% of the time pulmonary nodules are benign.   Her PCP has also referred the patient to ENT for right pain anterior to the right ear.  On physical exam, the patient does have crepitus on  opening and closing the mouth, worrisome for TMJ syndrome.  Will defer to ENT.   From a breast cancer standpoint, she is in a remission.  Her mammogram was reviewed with the patient from Jan 2014 and it was negative.  I personally reviewed and went over radiographic studies with the patient.  We also reviewed old lab work.  I personally reviewed and went over laboratory results with the patient.  Will defer future lab work to PCP unless an issue arises.    Oncologically, she denies any complaints and ROS questioning is negative.   Past Medical History  Diagnosis Date  . Hypertension   . Breast cancer, left breast   . Nicotine dependence   . COPD (chronic obstructive pulmonary disease)   . Osteoarthritis   . Kidney stones   . Diverticula of colon   . Hyperlipidemia   . Breast cancer 03/11/2012    Stage I (T1b N0 M0), grade 1 well-differentiated carcinoma of the left breast status, post lumpectomy followed by radiation therapy. Her estrogen receptor receptors were 93%, progesterone receptors 67%. HER-2/neu was negative. No lymphovascular space invasion was seen. All margins were clear. Ki-67 marker was low at 1% with surgery on 11/15/2004. Treated then with post-lumpectomy radiation, finish  . Diabetes mellitus without complication     has HYPERLIPIDEMIA; NICOTINE ADDICTION; ADJ DISORDER WITH MIXED ANXIETY & DEPRESSED MOOD; HYPERTENSION; OTHER CHRONIC BRONCHITIS; COPD; DYSPEPSIA; CONSTIPATION, CHRONIC; GENERALIZED OSTEOARTHROSIS UNSPECIFIED SITE; FATIGUE; IMPAIRED FASTING GLUCOSE; Ganglion cyst; Diabetes mellitus type 2 in obese; Otitis media of left  ear; Sinusitis, acute maxillary; Otalgia of left ear; UTI (lower urinary tract infection); Laceration of left leg; and Breast cancer on her problem list.     is allergic to penicillins and sulfonamide derivatives.  Ms. Muhlenkamp had no medications administered during this visit.  Past Surgical History  Procedure Laterality Date  . Abdominal  hysterectomy    . Tubal ligation    . Left breast       cancer, in 2006  . Breast surgery  2005 approx    left lumpectomy   . Colectomy      2005, diverticulitis    Denies any headaches, dizziness, double vision, fevers, chills, night sweats, nausea, vomiting, diarrhea, constipation, chest pain, heart palpitations, shortness of breath, blood in stool, black tarry stool, urinary pain, urinary burning, urinary frequency, hematuria.   PHYSICAL EXAMINATION  ECOG PERFORMANCE STATUS: 1 - Symptomatic but completely ambulatory  Filed Vitals:   03/11/12 1153  BP: 116/71  Pulse: 77  Temp: 98.3 F (36.8 C)  Resp: 18    GENERAL:alert, no distress, well nourished, well developed, comfortable, cooperative, smiling and chronically ill appearing and appears older than stated age. SKIN: skin color, texture, turgor are normal, no rashes or significant lesions, a few moles and SK's noted on skin and back HEAD: Normocephalic, No masses, lesions, tenderness or abnormalities. Crepitus noted at TMJ on opening and closing mouth EYES: normal, Conjunctiva are pink and non-injected EARS: External ears normal OROPHARYNX:lips, buccal mucosa, and tongue normal and mucous membranes are moist  NECK: supple, no adenopathy, thyroid normal size, non-tender, without nodularity, no stridor, non-tender, trachea midline LYMPH:  no palpable lymphadenopathy, no hepatosplenomegaly BREAST:breasts appear normal, no suspicious masses, no skin or nipple changes or axillary nodes LUNGS: clear to auscultation and percussion, decreased breath sounds HEART: regular rate & rhythm, no murmurs, no gallops, S1 normal and S2 normal ABDOMEN:abdomen soft, non-tender, obese, normal bowel sounds, no masses or organomegaly and no hepatosplenomegaly BACK: Back symmetric, no curvature. EXTREMITIES:less then 2 second capillary refill, no joint deformities, effusion, or inflammation, no edema, no skin discoloration, no clubbing, no cyanosis   NEURO: alert & oriented x 3 with fluent speech, no focal motor/sensory deficits, gait normal   RADIOGRAPHIC STUDIES:  03/03/2012  *RADIOLOGY REPORT*  Clinical Data: Chronic bronchitis, shortness of breath, smoker.  History of breast cancer.  CT CHEST WITHOUT CONTRAST  Technique: Multidetector CT imaging of the chest was performed  following the standard protocol without IV contrast.  Comparison: Chest radiograph 01/28/2012.  Findings: Mediastinal lymph nodes measure up to 11 mm in short axis  in the precarinal station. No definite hilar or axillary  adenopathy. Heart size normal. Coronary artery calcification. No  pericardial effusion.  Subpleural radiation fibrosis in the left upper lobe. There are a  few scattered vague tiny nodular densities in the lungs, primarily  in the right upper lobe, measuring 4 mm or less in size,  nonspecific. No pleural fluid. Airway is unremarkable.  Incidental imaging of the upper abdomen shows no acute findings.  No worrisome lytic or sclerotic lesions. Degenerative changes are  seen in the spine.  IMPRESSION:  1. No findings to explain the patient's given symptoms.  2. Vague tiny nodular densities, primarily in the right upper  lobe, are nonspecific and extremely unlikely represent metastatic  disease. Typically, if a patient is at increased risk for  bronchogenic carcinoma, a 78-month follow-up is recommended, as  clinically indicated.  Original Report Authenticated By: Leanna Battles, M.D.    01/30/2012 *RADIOLOGY  REPORT*  Clinical Data: History of left breast cancer status post  lumpectomy 2007  DIGITAL DIAGNOSTIC BILATERAL MAMMOGRAM WITH CAD  Comparison: With priors  Findings:  ACR Breast Density Category 2: There is a scattered fibroglandular  pattern.  There is no new suspicious mass or malignant-type  microcalcifications. Stable lumpectomy changes are seen in the  left breast.  Mammographic images were processed with CAD.   IMPRESSION:  No evidence of malignancy in either breast.  RECOMMENDATION:  Screening mammogram in 1 year is recommended.  I have discussed the findings and recommendations with the patient.  Results were also provided in writing at the conclusion of the  visit.  BI-RADS CATEGORY 2: Benign finding(s).  Original Report Authenticated By: Baird Lyons, M.D.    ASSESSMENT:  1. Stage I (T1b N0 M0), grade 1 well-differentiated carcinoma of the left breast status, post lumpectomy followed by radiation therapy. Her estrogen receptor receptors were 93%, progesterone receptors 67%. HER-2/neu was negative. No lymphovascular space invasion was seen. All margins were clear. Ki-67 marker was low at 1% withsurgery on 11/15/2004. Treated then with post-lumpectomy radiation, finished as of 02/22/2005, followed by Aromasin, which she could not tolerate. We then switched her to Femara, which she finished all therapy as of January 14, 2010. She actually had started her therapy officially on 12/11/2004.  2. Chronic obstructive pulmonary disease, still smoking 2-3 cigarettes a day and the e-cigarette 3. Diabetes mellitus.  4. Hypertension.  5. Hypokalemia in the past.  6. Chronic anxiety and depression with the death of her daughter at age 37 from a possible overdose, though her daughter had a bipolar disorder.  7. Vaginal hysterectomy years ago for benign disease.  8. Kidney stones in the past.  9. Poor dental hygiene.  10.Right wrist trauma in the past.  11.Right ankle trauma from a fall in the past.  12.Broken ribs, looked after by Dr. Hilda Lias in January 2012. 13. TMJ Disorder? 14. Nonspecific pulmonary nodule measuring 4 mm or less on 03/03/2012 CT of chest   PLAN:  1. I personally reviewed and went over laboratory results with the patient. 2. I personally reviewed and went over radiographic studies with the patient. 3. Next Screening Mammogram in Jan 2014 4. Pulmonologist appt on 03/18/2012 for pulmonary  nodule on CT scan of chest on 03/03/2012. 5. Patient education regarding pulmonary nodules 6. Smoking cessation education provided.  7. Labs per PCP 8. ENT referral per PCP. 9. Return in 1 year for follow-up.    All questions were answered. The patient knows to call the clinic with any problems, questions or concerns. We can certainly see the patient much sooner if necessary.  The patient and plan discussed with Glenford Peers, MD and he is in agreement with the aforementioned.  KEFALAS,THOMAS

## 2012-03-11 NOTE — Patient Instructions (Addendum)
[facility] Discharge Instructions  RECOMMENDATIONS MADE BY THE CONSULTANT AND ANY TEST RESULTS WILL BE SENT TO YOUR REFERRING PHYSICIAN.  You are in a complete remission from your breast cancer. Continue mammograms yearly. Return to see [place] in 1 year. PLEASE try to QUIT SMOKING. Keep your appointment with Dr.Young to follow-up on the lung nodule seen on CT scan. Report any issues/concerns to [place] as needed prior to your appointment.  Thank you for choosing [facility] to provide your oncology and hematology care.  To afford each patient quality time with our providers, please arrive at least 15 minutes before your scheduled appointment time.  With your help, our goal is to use those 15 minutes to complete the necessary work-up to ensure our physicians have the information they need to help with your evaluation and healthcare recommendations.    Effective January 1st, 2014, we ask that you re-schedule your appointment with our physicians should you arrive 10 or more minutes late for your appointment.  We strive to give you quality time with our providers, and arriving late affects you and other patients whose appointments are after yours.    Again, thank you for choosing [facility].  Our hope is that these requests will decrease the amount of time that you wait before being seen by our physicians.       _____________________________________________________________  Should you have questions after your visit to [facility], please contact our office at [phone] between the hours of 8:30 a.m. and 5:00 p.m.  Voicemails left after 4:30 p.m. will not be returned until the following business day.  For prescription refill requests, have your pharmacy contact our office with your prescription refill request.

## 2012-03-12 LAB — PULMONARY FUNCTION TEST

## 2012-03-13 DIAGNOSIS — M25559 Pain in unspecified hip: Secondary | ICD-10-CM | POA: Diagnosis not present

## 2012-03-13 DIAGNOSIS — I10 Essential (primary) hypertension: Secondary | ICD-10-CM | POA: Diagnosis not present

## 2012-03-13 DIAGNOSIS — S63509A Unspecified sprain of unspecified wrist, initial encounter: Secondary | ICD-10-CM | POA: Diagnosis not present

## 2012-03-13 DIAGNOSIS — M25549 Pain in joints of unspecified hand: Secondary | ICD-10-CM | POA: Diagnosis not present

## 2012-03-13 DIAGNOSIS — M25519 Pain in unspecified shoulder: Secondary | ICD-10-CM | POA: Diagnosis not present

## 2012-03-17 ENCOUNTER — Other Ambulatory Visit: Payer: Self-pay | Admitting: Family Medicine

## 2012-03-18 ENCOUNTER — Institutional Professional Consult (permissible substitution): Payer: Medicare Other | Admitting: Internal Medicine

## 2012-03-20 ENCOUNTER — Ambulatory Visit (INDEPENDENT_AMBULATORY_CARE_PROVIDER_SITE_OTHER): Payer: Medicaid Other | Admitting: Otolaryngology

## 2012-03-20 DIAGNOSIS — H6980 Other specified disorders of Eustachian tube, unspecified ear: Secondary | ICD-10-CM | POA: Diagnosis not present

## 2012-03-20 DIAGNOSIS — J343 Hypertrophy of nasal turbinates: Secondary | ICD-10-CM

## 2012-04-04 ENCOUNTER — Encounter: Payer: Self-pay | Admitting: Pulmonary Disease

## 2012-04-04 ENCOUNTER — Ambulatory Visit (INDEPENDENT_AMBULATORY_CARE_PROVIDER_SITE_OTHER): Payer: Medicare Other | Admitting: Pulmonary Disease

## 2012-04-04 VITALS — BP 124/80 | HR 102 | Temp 98.0°F | Ht 65.0 in | Wt 177.0 lb

## 2012-04-04 DIAGNOSIS — R918 Other nonspecific abnormal finding of lung field: Secondary | ICD-10-CM

## 2012-04-04 NOTE — Progress Notes (Signed)
  Subjective:    Patient ID: [name], female    DOB: [birth year], 67 y.o.   MRN: [id number]  HPI The patient is a 67 year old female who I've been asked to see for management of pulmonary nodules.  She has a long smoking history, as well as a history of limited stage breast cancer, and was recently found to have a few nodular densities in the right lung which were 4 mm or less in diameter.  The patient tells me that she has never lived in [place] or [place], and has no history of TB exposure.  She has had a recent PPD placed, and was negative.  She tells me that her health maintenance is up to date, and she is not losing weight nor does she have a poor appetite.  Her breast cancer was diagnosed in 2006, and she is status post lumpectomy with local radiation.  She is being followed closely by oncology with no evidence for recurrence.   Review of Systems  Constitutional: Negative for fever and unexpected weight change.  HENT: Positive for congestion, rhinorrhea, sneezing, postnasal drip and sinus pressure. Negative for ear pain, nosebleeds, sore throat, trouble swallowing and dental problem.   Eyes: Negative for redness and itching.  Respiratory: Positive for cough and wheezing. Negative for chest tightness and shortness of breath.   Cardiovascular: Positive for leg swelling. Negative for palpitations.  Gastrointestinal: Negative for nausea and vomiting.  Genitourinary: Negative for dysuria.  Musculoskeletal: Negative for joint swelling.  Skin: Negative for rash.  Neurological: Positive for headaches.  Hematological: Does not bruise/bleed easily.  Psychiatric/Behavioral: Negative for dysphoric mood. The patient is not nervous/anxious.        Objective:   Physical Exam Constitutional:  Well developed, no acute distress  HENT:  Nares patent without discharge  Oropharynx without exudate, palate and uvula are normal  Eyes:  Perrla, eomi, no scleral icterus  Neck:  No JVD, no  TMG  Cardiovascular:  Normal rate, regular rhythm, no rubs or gallops.  No murmurs        Intact distal pulses   Pulmonary :  Normal breath sounds, no stridor or respiratory distress   No rales, or wheezing.  A few scattered rhonchi  Abdominal:  Soft, nondistended, bowel sounds present.  No tenderness noted.   Musculoskeletal:  mild lower extremity edema noted.  Lymph Nodes:  No cervical lymphadenopathy noted  Skin:  No cyanosis noted  Neurologic:  Alert, appropriate, moves all 4 extremities without obvious deficit.         Assessment & Plan:

## 2012-04-04 NOTE — Patient Instructions (Addendum)
Will schedule you for a followup scan of your chest in 6mos at [facility].  If this is stable, would do one more at one year.  Stop smoking.  This is the most important thing you can do to help your breathing and prevent your recurrent chest colds.

## 2012-04-04 NOTE — Assessment & Plan Note (Signed)
The patient has tiny pulmonary nodules that are primarily in the right lung.  She has a long history of smoking, and also has a history of limited stage breast cancer that appears to be cured.  However, this would put her into a high risk category with respect to the Fleischner criteria.  I would typically recommend a one-year followup over concern of lung cancer, but with her history of breast cancer, I would like to do a followup scan in 6 months and in one year if stable.  I have told her these are most likely inflammatory, or possibly an intrapulmonary lymph node.  She is agreeable to this approach.

## 2012-04-23 ENCOUNTER — Telehealth: Payer: Self-pay | Admitting: Family Medicine

## 2012-04-23 DIAGNOSIS — I10 Essential (primary) hypertension: Secondary | ICD-10-CM

## 2012-04-23 DIAGNOSIS — R5381 Other malaise: Secondary | ICD-10-CM

## 2012-04-23 DIAGNOSIS — E119 Type 2 diabetes mellitus without complications: Secondary | ICD-10-CM | POA: Diagnosis not present

## 2012-04-23 DIAGNOSIS — E669 Obesity, unspecified: Secondary | ICD-10-CM | POA: Diagnosis not present

## 2012-04-23 DIAGNOSIS — R7301 Impaired fasting glucose: Secondary | ICD-10-CM | POA: Diagnosis not present

## 2012-04-23 DIAGNOSIS — E785 Hyperlipidemia, unspecified: Secondary | ICD-10-CM

## 2012-04-23 DIAGNOSIS — R5383 Other fatigue: Secondary | ICD-10-CM | POA: Diagnosis not present

## 2012-04-23 NOTE — Telephone Encounter (Signed)
All cancelled in 01/31 needs to be reordered pls

## 2012-04-23 NOTE — Telephone Encounter (Signed)
NOt the microalb though

## 2012-04-23 NOTE — Telephone Encounter (Signed)
Are there any labs that you would like to order before visit?

## 2012-04-24 DIAGNOSIS — R7301 Impaired fasting glucose: Secondary | ICD-10-CM | POA: Diagnosis not present

## 2012-04-24 DIAGNOSIS — E669 Obesity, unspecified: Secondary | ICD-10-CM | POA: Diagnosis not present

## 2012-04-24 DIAGNOSIS — I10 Essential (primary) hypertension: Secondary | ICD-10-CM | POA: Diagnosis not present

## 2012-04-24 DIAGNOSIS — E119 Type 2 diabetes mellitus without complications: Secondary | ICD-10-CM | POA: Diagnosis not present

## 2012-04-24 DIAGNOSIS — R5381 Other malaise: Secondary | ICD-10-CM | POA: Diagnosis not present

## 2012-04-25 LAB — TSH: TSH: 1.403 u[IU]/mL (ref 0.350–4.500)

## 2012-04-25 LAB — BASIC METABOLIC PANEL
Calcium: 10.4 mg/dL (ref 8.4–10.5)
Creat: 1.03 mg/dL (ref 0.50–1.10)
Sodium: 139 mEq/L (ref 135–145)

## 2012-04-25 LAB — CBC
HCT: 43.5 % (ref 36.0–46.0)
Hemoglobin: 14.6 g/dL (ref 12.0–15.0)
RDW: 14.2 % (ref 11.5–15.5)
WBC: 11.7 10*3/uL — ABNORMAL HIGH (ref 4.0–10.5)

## 2012-04-25 LAB — HEMOGLOBIN A1C: Hgb A1c MFr Bld: 6.2 % — ABNORMAL HIGH (ref <5.7)

## 2012-04-25 NOTE — Telephone Encounter (Signed)
Patient aware and labs faxed to [facility]

## 2012-04-28 ENCOUNTER — Other Ambulatory Visit: Payer: Self-pay | Admitting: Family Medicine

## 2012-04-30 ENCOUNTER — Encounter: Payer: Self-pay | Admitting: Family Medicine

## 2012-04-30 ENCOUNTER — Ambulatory Visit (INDEPENDENT_AMBULATORY_CARE_PROVIDER_SITE_OTHER): Payer: Medicare Other | Admitting: Family Medicine

## 2012-04-30 VITALS — BP 124/72 | HR 93 | Temp 99.0°F | Resp 18 | Ht 65.0 in | Wt 174.1 lb

## 2012-04-30 DIAGNOSIS — E785 Hyperlipidemia, unspecified: Secondary | ICD-10-CM | POA: Diagnosis not present

## 2012-04-30 DIAGNOSIS — E1169 Type 2 diabetes mellitus with other specified complication: Secondary | ICD-10-CM

## 2012-04-30 DIAGNOSIS — J0100 Acute maxillary sinusitis, unspecified: Secondary | ICD-10-CM

## 2012-04-30 DIAGNOSIS — E669 Obesity, unspecified: Secondary | ICD-10-CM

## 2012-04-30 DIAGNOSIS — F17200 Nicotine dependence, unspecified, uncomplicated: Secondary | ICD-10-CM | POA: Diagnosis not present

## 2012-04-30 DIAGNOSIS — E119 Type 2 diabetes mellitus without complications: Secondary | ICD-10-CM | POA: Diagnosis not present

## 2012-04-30 DIAGNOSIS — I10 Essential (primary) hypertension: Secondary | ICD-10-CM | POA: Diagnosis not present

## 2012-04-30 MED ORDER — CEPHALEXIN 500 MG PO CAPS: 500.0000 mg | ORAL_CAPSULE | Freq: Four times a day (QID) | ORAL | Status: DC

## 2012-04-30 NOTE — Telephone Encounter (Signed)
Came in for OV 

## 2012-04-30 NOTE — Patient Instructions (Addendum)
Annual wellness mid September please call if you need me before  Blood sugar has improved, and you have lost weight.  Keep it up.  Also please set a quit date and stop smoking, I am glad that you are working on this  Remember to examine your feet daily for any cuts ore sores  Keflex is sent in for your sinuses  Fasting lipid, cmp and EGFr, HBA1C in mid September  Please check your pharmacy to see if the shingles vaccine is covered, and how much it will cost you, you need this

## 2012-04-30 NOTE — Progress Notes (Signed)
  Subjective:    Patient ID: [name], female    DOB: 06/02/1945, 67 y.o.   MRN: [id number]  HPI The PT is here for follow up and re-evaluation of chronic medical conditions, medication management and review of any available recent lab and radiology data.  Preventive health is updated, specifically  Cancer screening and Immunization.   Questions or concerns regarding consultations or procedures which the PT has had in the interim are  addressed. The PT denies any adverse reactions to current medications since the last visit.  C/o left maxillary sinus pressure and increased draiangae which is thick with tender glands for the past 5 days. Cutting back on nicotine, also  Cutting back on calories with good weight loss. No regular exercise yet, waiting for weather to improve Test blood sugar daily, fasting glucose level seldom over 130    Review of Systems See HPI Denies recent fever or chills. Denies , nasal congestion, ear pain or sore throat. Denies chest congestion, productive cough or wheezing. Denies chest pains, palpitations and leg swelling Denies abdominal pain, nausea, vomiting,diarrhea or constipation.   Denies dysuria, frequency, hesitancy or incontinence. Chronic  joint pain,  and limitation in mobility. Denies headaches, seizures, numbness, or tingling. Denies uncontrolled depression, anxiety or insomnia. Denies skin break down or rash.        Objective:   Physical Exam Patient alert and oriented and in no cardiopulmonary distress.  HEENT: No facial asymmetry, EOMI, left maxillary  sinus tenderness,  oropharynx pink and moist.  Neck supple left anterior cervical adenopathy.TM clear bilaterally  Chest: Clear to auscultation bilaterally.decreased though adequate air entry  CVS: S1, S2 no murmurs, no S3.  ABD: Soft non tender. Bowel sounds normal.  Ext: No edema  MS: Adequate ROM spine, shoulders, hips and knees.  Skin: Intact, no ulcerations or rash  noted.  Psych: Good eye contact, normal affect. Memory intact not anxious or depressed appearing.  CNS: CN 2-12 intact, power, tone and sensation normal throughout.        Assessment & Plan:

## 2012-05-01 ENCOUNTER — Telehealth: Payer: Self-pay | Admitting: Family Medicine

## 2012-05-01 NOTE — Telephone Encounter (Signed)
error 

## 2012-05-01 NOTE — Telephone Encounter (Signed)
Noted  

## 2012-05-02 NOTE — Assessment & Plan Note (Signed)
Updated lab needed Hyperlipidemia:Low fat diet discussed and encouraged.   

## 2012-05-02 NOTE — Assessment & Plan Note (Signed)
Controlled, no change in medication DASH diet and commitment to daily physical activity for a minimum of 30 minutes discussed and encouraged, as a part of hypertension management. The importance of attaining a healthy weight is also discussed.  

## 2012-05-02 NOTE — Assessment & Plan Note (Signed)
antibiotic prescribed 

## 2012-05-02 NOTE — Assessment & Plan Note (Signed)
Controlled, no change in medication Patient advised to reduce carb and sweets, commit to regular physical activity, take meds as prescribed, test blood as directed, and attempt to lose weight, to improve blood sugar control.  

## 2012-05-02 NOTE — Assessment & Plan Note (Addendum)
Improved , unwilling to comiting to quit at this time Patient counseled for approximately 5 minutes regarding the health risks of ongoing nicotine use, specifically all types of cancer, heart disease, stroke and respiratory failure. The options available for help with cessation ,the behavioral changes to assist the process, and the option to either gradully reduce usage  Or abruptly stop.is also discussed. Pt is also encouraged to set specific goals in number of cigarettes used daily, as well as to set a quit date.

## 2012-05-13 DIAGNOSIS — S63509A Unspecified sprain of unspecified wrist, initial encounter: Secondary | ICD-10-CM | POA: Diagnosis not present

## 2012-05-13 DIAGNOSIS — I10 Essential (primary) hypertension: Secondary | ICD-10-CM | POA: Diagnosis not present

## 2012-05-13 DIAGNOSIS — M25519 Pain in unspecified shoulder: Secondary | ICD-10-CM | POA: Diagnosis not present

## 2012-05-13 DIAGNOSIS — M25549 Pain in joints of unspecified hand: Secondary | ICD-10-CM | POA: Diagnosis not present

## 2012-05-13 DIAGNOSIS — M25579 Pain in unspecified ankle and joints of unspecified foot: Secondary | ICD-10-CM | POA: Diagnosis not present

## 2012-05-19 ENCOUNTER — Other Ambulatory Visit: Payer: Self-pay | Admitting: Family Medicine

## 2012-06-04 ENCOUNTER — Other Ambulatory Visit: Payer: Self-pay | Admitting: Family Medicine

## 2012-06-11 ENCOUNTER — Other Ambulatory Visit: Payer: Self-pay | Admitting: Family Medicine

## 2012-06-12 DIAGNOSIS — M25519 Pain in unspecified shoulder: Secondary | ICD-10-CM | POA: Diagnosis not present

## 2012-06-12 DIAGNOSIS — I10 Essential (primary) hypertension: Secondary | ICD-10-CM | POA: Diagnosis not present

## 2012-06-12 DIAGNOSIS — M25569 Pain in unspecified knee: Secondary | ICD-10-CM | POA: Diagnosis not present

## 2012-06-12 DIAGNOSIS — M25559 Pain in unspecified hip: Secondary | ICD-10-CM | POA: Diagnosis not present

## 2012-06-12 DIAGNOSIS — M25579 Pain in unspecified ankle and joints of unspecified foot: Secondary | ICD-10-CM | POA: Diagnosis not present

## 2012-06-23 ENCOUNTER — Ambulatory Visit (INDEPENDENT_AMBULATORY_CARE_PROVIDER_SITE_OTHER): Payer: Medicare Other | Admitting: Family Medicine

## 2012-06-23 VITALS — BP 150/82 | HR 94 | Temp 98.4°F | Resp 18 | Ht 65.0 in | Wt 173.1 lb

## 2012-06-23 DIAGNOSIS — F17200 Nicotine dependence, unspecified, uncomplicated: Secondary | ICD-10-CM | POA: Diagnosis not present

## 2012-06-23 DIAGNOSIS — E785 Hyperlipidemia, unspecified: Secondary | ICD-10-CM

## 2012-06-23 DIAGNOSIS — J0100 Acute maxillary sinusitis, unspecified: Secondary | ICD-10-CM | POA: Diagnosis not present

## 2012-06-23 DIAGNOSIS — J42 Unspecified chronic bronchitis: Secondary | ICD-10-CM

## 2012-06-23 DIAGNOSIS — I10 Essential (primary) hypertension: Secondary | ICD-10-CM

## 2012-06-23 DIAGNOSIS — E119 Type 2 diabetes mellitus without complications: Secondary | ICD-10-CM

## 2012-06-23 DIAGNOSIS — J449 Chronic obstructive pulmonary disease, unspecified: Secondary | ICD-10-CM

## 2012-06-23 DIAGNOSIS — E669 Obesity, unspecified: Secondary | ICD-10-CM

## 2012-06-23 DIAGNOSIS — E1169 Type 2 diabetes mellitus with other specified complication: Secondary | ICD-10-CM

## 2012-06-23 MED ORDER — CLARITHROMYCIN 500 MG PO TABS: 500.0000 mg | ORAL_TABLET | Freq: Two times a day (BID) | ORAL | Status: AC

## 2012-06-23 MED ORDER — PREDNISONE (PAK) 5 MG PO TABS: 5.0000 mg | ORAL_TABLET | ORAL | Status: DC

## 2012-06-23 MED ORDER — ALBUTEROL SULFATE (5 MG/ML) 0.5% IN NEBU
2.5000 mg | INHALATION_SOLUTION | Freq: Once | RESPIRATORY_TRACT | Status: AC
  Administered 2012-06-23: 2.5 mg via RESPIRATORY_TRACT

## 2012-06-23 MED ORDER — BENZONATATE 100 MG PO CAPS: 100.0000 mg | ORAL_CAPSULE | Freq: Four times a day (QID) | ORAL | Status: DC | PRN

## 2012-06-23 MED ORDER — METHYLPREDNISOLONE ACETATE 80 MG/ML IJ SUSP
80.0000 mg | Freq: Once | INTRAMUSCULAR | Status: AC
  Administered 2012-06-23: 80 mg via INTRAMUSCULAR

## 2012-06-23 MED ORDER — IPRATROPIUM BROMIDE 0.02 % IN SOLN
0.5000 mg | Freq: Once | RESPIRATORY_TRACT | Status: AC
  Administered 2012-06-23: 0.5 mg via RESPIRATORY_TRACT

## 2012-06-23 NOTE — Progress Notes (Signed)
  Subjective:    Patient ID: [name], female    DOB: 09-14-1945, 67 y.o.   MRN: [id number]  HPI 2 week h/o head and chest congestion yellow sputum and yellow nasal drianage. At times d/c is clear. Chills and intermittent fever, undocumented,increased body aches. Tobacco down to 10 per day, unwilling to set quit date at this time Notes progressive shortness of breath and wheeze  And understands the link to nicotine    Review of Systems See HPI Denies chest pains, palpitations and leg swelling Denies abdominal pain, nausea, vomiting,diarrhea or constipation.   Denies dysuria, frequency, hesitancy or incontinence. Denies joint pain, swelling and limitation in mobility. Denies headaches, seizures, numbness, or tingling. Denies depression, anxiety or insomnia. Denies skin break down or rash.        Objective:   Physical Exam  Patient alert and oriented and in mild cardiopulmonary distress.Ill appearing  HEENT: No facial asymmetry, EOMI, frontal and maxillary  sinus tenderness,  oropharynx pink and moist.  Neck supple no adenopathy.  Chest: decreased air entry scattered crackles and wheezes  CVS: S1, S2 no murmurs, no S3.  ABD: Soft non tender. Bowel sounds normal.  Ext: No edema  MS: Adequate ROM spine, shoulders, hips and knees.  Skin: Intact, no ulcerations or rash noted.  Psych: Good eye contact, normal affect. Memory intact not anxious or depressed appearing.  CNS: CN 2-12 intact, power, tone and sensation normal throughout.       Assessment & Plan:

## 2012-06-23 NOTE — Assessment & Plan Note (Addendum)
Worsening due to ongoing nicotine , refer for overnight pulse ox Acute flare with broncitis, neb treatment in office

## 2012-06-23 NOTE — Patient Instructions (Addendum)
F/u 2nd week in August, call if you need me before  You are being treated for COPD flare, acute bronchitis and sinusitis.  Neb treatment in office and depo medrol 80mg  IM.2 week course of antibiotic and a 6 day course of prednisone are also prescribed.please do not take simvastatin while you are on the antibiotic due to potential drug interaction, oK to resume once you finish the antibiotic  Please call if symptoms persist on completion of treatment   You will be referred for evaluation for the need for oxygen while sleeping  Your breathing is getting worse, to reverse this and stop it you need to stop smoking  Fasting lipid, cmp and EGFR, hBA1C, first week in August, before visit

## 2012-07-05 ENCOUNTER — Encounter: Payer: Self-pay | Admitting: Family Medicine

## 2012-07-05 NOTE — Assessment & Plan Note (Signed)
Controlled, no change in medication DASH diet and commitment to daily physical activity for a minimum of 30 minutes discussed and encouraged, as a part of hypertension management. The importance of attaining a healthy weight is also discussed.  

## 2012-07-05 NOTE — Assessment & Plan Note (Signed)
Updated lab for next visit

## 2012-07-05 NOTE — Assessment & Plan Note (Signed)
Controlled, no change in medication Patient advised to reduce carb and sweets, commit to regular physical activity, take meds as prescribed, test blood as directed, and attempt to lose weight, to improve blood sugar control.  

## 2012-07-05 NOTE — Assessment & Plan Note (Signed)
Current 10 per day whichis unchanged froom last visit despite recurrent resp symptoms Patient counseled for approximately 5 minutes regarding the health risks of ongoing nicotine use, specifically all types of cancer, heart disease, stroke and respiratory failure. The options available for help with cessation ,the behavioral changes to assist the process, and the option to either gradully reduce usage  Or abruptly stop.is also discussed. Pt is also encouraged to set specific goals in number of cigarettes used daily, as well as to set a quit date.

## 2012-07-10 DIAGNOSIS — M25569 Pain in unspecified knee: Secondary | ICD-10-CM | POA: Diagnosis not present

## 2012-07-10 DIAGNOSIS — I10 Essential (primary) hypertension: Secondary | ICD-10-CM | POA: Diagnosis not present

## 2012-07-10 DIAGNOSIS — M25559 Pain in unspecified hip: Secondary | ICD-10-CM | POA: Diagnosis not present

## 2012-07-10 DIAGNOSIS — M25579 Pain in unspecified ankle and joints of unspecified foot: Secondary | ICD-10-CM | POA: Diagnosis not present

## 2012-07-10 DIAGNOSIS — M545 Low back pain: Secondary | ICD-10-CM | POA: Diagnosis not present

## 2012-07-10 DIAGNOSIS — M25519 Pain in unspecified shoulder: Secondary | ICD-10-CM | POA: Diagnosis not present

## 2012-07-29 ENCOUNTER — Telehealth: Payer: Self-pay | Admitting: Family Medicine

## 2012-07-29 ENCOUNTER — Other Ambulatory Visit: Payer: Self-pay | Admitting: Family Medicine

## 2012-07-29 NOTE — Telephone Encounter (Signed)
Please advise 

## 2012-07-29 NOTE — Telephone Encounter (Signed)
pls advise stop the simvastatin, see if the rash and itch goes away, call beack when ir t does so another medication can be tried

## 2012-08-07 NOTE — Telephone Encounter (Signed)
Called patient and she states that she no longer has the rash and has been taking the Simvastatin.  Instructed her to continue to Simvastatin as prescribed.

## 2012-08-21 DIAGNOSIS — E785 Hyperlipidemia, unspecified: Secondary | ICD-10-CM | POA: Diagnosis not present

## 2012-08-21 DIAGNOSIS — E119 Type 2 diabetes mellitus without complications: Secondary | ICD-10-CM | POA: Diagnosis not present

## 2012-08-21 DIAGNOSIS — E669 Obesity, unspecified: Secondary | ICD-10-CM | POA: Diagnosis not present

## 2012-08-21 LAB — HEMOGLOBIN A1C
Hgb A1c MFr Bld: 5.9 % — ABNORMAL HIGH (ref <5.7)
Mean Plasma Glucose: 123 mg/dL — ABNORMAL HIGH (ref <117)

## 2012-08-22 ENCOUNTER — Other Ambulatory Visit: Payer: Self-pay | Admitting: Family Medicine

## 2012-08-22 LAB — COMPLETE METABOLIC PANEL WITH GFR
BUN: 20 mg/dL (ref 6–23)
CO2: 32 mEq/L (ref 19–32)
Calcium: 9.8 mg/dL (ref 8.4–10.5)
Chloride: 102 mEq/L (ref 96–112)
Creat: 0.81 mg/dL (ref 0.50–1.10)
GFR, Est African American: 87 mL/min
GFR, Est Non African American: 75 mL/min

## 2012-08-25 ENCOUNTER — Ambulatory Visit (HOSPITAL_COMMUNITY)
Admission: RE | Admit: 2012-08-25 | Discharge: 2012-08-25 | Disposition: A | Discharge status: Home / Self Care | Payer: Medicare Other | Source: Ambulatory Visit | Attending: Pulmonary Disease | Admitting: Pulmonary Disease

## 2012-08-25 DIAGNOSIS — R911 Solitary pulmonary nodule: Secondary | ICD-10-CM | POA: Insufficient documentation

## 2012-08-25 DIAGNOSIS — Z09 Encounter for follow-up examination after completed treatment for conditions other than malignant neoplasm: Secondary | ICD-10-CM | POA: Diagnosis not present

## 2012-08-25 DIAGNOSIS — J984 Other disorders of lung: Secondary | ICD-10-CM | POA: Diagnosis not present

## 2012-08-25 DIAGNOSIS — R918 Other nonspecific abnormal finding of lung field: Secondary | ICD-10-CM

## 2012-08-26 ENCOUNTER — Encounter: Payer: Self-pay | Admitting: Family Medicine

## 2012-08-26 ENCOUNTER — Ambulatory Visit (INDEPENDENT_AMBULATORY_CARE_PROVIDER_SITE_OTHER): Payer: Medicare Other | Admitting: Family Medicine

## 2012-08-26 VITALS — BP 130/72 | HR 95 | Resp 16 | Ht 65.0 in | Wt 166.4 lb

## 2012-08-26 DIAGNOSIS — E119 Type 2 diabetes mellitus without complications: Secondary | ICD-10-CM

## 2012-08-26 DIAGNOSIS — Z0000 Encounter for general adult medical examination without abnormal findings: Secondary | ICD-10-CM

## 2012-08-26 DIAGNOSIS — J42 Unspecified chronic bronchitis: Secondary | ICD-10-CM

## 2012-08-26 DIAGNOSIS — I10 Essential (primary) hypertension: Secondary | ICD-10-CM

## 2012-08-26 DIAGNOSIS — J449 Chronic obstructive pulmonary disease, unspecified: Secondary | ICD-10-CM

## 2012-08-26 DIAGNOSIS — E785 Hyperlipidemia, unspecified: Secondary | ICD-10-CM

## 2012-08-26 DIAGNOSIS — E669 Obesity, unspecified: Secondary | ICD-10-CM

## 2012-08-26 DIAGNOSIS — E1169 Type 2 diabetes mellitus with other specified complication: Secondary | ICD-10-CM

## 2012-08-26 DIAGNOSIS — K5909 Other constipation: Secondary | ICD-10-CM

## 2012-08-26 MED ORDER — TIOTROPIUM BROMIDE MONOHYDRATE 18 MCG IN CAPS: 18.0000 ug | ORAL_CAPSULE | Freq: Every day | RESPIRATORY_TRACT | Status: DC

## 2012-08-26 MED ORDER — BENAZEPRIL HCL 20 MG PO TABS: 20.0000 mg | ORAL_TABLET | Freq: Every day | ORAL | Status: DC

## 2012-08-26 MED ORDER — BUDESONIDE-FORMOTEROL FUMARATE 80-4.5 MCG/ACT IN AERO: 2.0000 | INHALATION_SPRAY | Freq: Two times a day (BID) | RESPIRATORY_TRACT | Status: DC

## 2012-08-26 NOTE — Progress Notes (Signed)
Subjective:    Patient ID: [name], female    DOB: [birth year], 67 y.o.   MRN: [id number]  HPI Preventive Screening-Counseling & Management   Patient present here today for a Medicare annual wellness visit. C/o approx 4 day h/o increased chest congestion and shortness of breath, no fever or chills , sputum slightly discolored. Denies sinus pressure, sore throat or ear pain. C/o light headedness with change in position, denies falls. Needs to also review recent lab data    Current Problems (verified)   Medications Prior to Visit Allergies (verified)   PAST HISTORY  Family History 4 sibs deceased , 1 living, oldest sister died of lung ca in her 55's, youngest sister died of throat ca in her 56's, one brother burned to death in a trailer, also had seizures, brother had heart probs , unsure what exactly Both parents deceased father age 52 , heart probs, and Mom age 45 CVA  Social History Separated, mom of 5 children, 1 son died of SIDS at 77 month, other son in a MVA, 7 years ago, early 81's Ongoing nicotine use, no alcohol, no street drug use Multiple  Jobs, stopped in 1971   Risk Factors  Current exercise habits:  Needs to commit to 30 minutes at least 6 days per week  Dietary issues discussed:low fat , low carb diet , reduce sweets   Cardiac risk factors: Nicotine , diabetes  Depression Screen  (Note: if answer to either of the following is "Yes", a more complete depression screening is indicated)   Over the past two weeks, have you felt down, depressed or hopeless? No  Over the past two weeks, have you felt little interest or pleasure in doing things? No  Have you lost interest or pleasure in daily life? No  Do you often feel hopeless? No  Do you cry easily over simple problems? No   Activities of Daily Living  In your present state of health, do you have any difficulty performing the following activities?  Driving?: big vehicle an issue, last drove 3  years Managing money?: No Feeding yourself?:No Getting from bed to chair?:No Climbing a flight of stairs?:limited due to arthritis Preparing food and eating?:No Bathing or showering?:No Getting dressed?:No Getting to the toilet?:No Using the toilet?:No Moving around from place to place?: sometimes gets pain with excessive walking and some instability  Fall Risk Assessment In the past year have you fallen or had a near fall?:yes, 1 Are you currently taking any medications that make you dizzy?:yes, blood pressure med with hctz   Hearing Difficulties: No Do you often ask people to speak up or repeat themselves?:No Do you experience ringing or noises in your ears?:No Do you have difficulty understanding soft or whispered voices?:No  Cognitive Testing  Alert? Yes Normal Appearance?Yes  Oriented to person? Yes Place? Yes  Time? Yes  Displays appropriate judgment?Yes  Can read the correct time from a watch face? yes Are you having problems remembering things?No  Advanced Directives have been discussed with the patient?Yes , full code   List the Names of Other Physician/Practitioners you currently use: Dr Cora Daniels, Oncology, Adrian Prince any recent Medical Services you may have received from other than [facility] providers in the past year (date may be approximate).   Assessment:    Annual Wellness Exam   Plan:    During the course of the visit the patient was educated and counseled about appropriate screening and preventive services including:  A healthy diet  is rich in fruit, vegetables and whole grains. Poultry fish, nuts and beans are a healthy choice for protein rather then red meat. A low sodium diet and drinking 64 ounces of water daily is generally recommended. Oils and sweet should be limited. Carbohydrates especially for those who are diabetic or overweight, should be limited to 30-45 gram per meal. It is important to eat on a regular schedule, at least 3 times  daily. Snacks should be primarily fruits, vegetables or nuts. It is important that you exercise regularly at least 30 minutes 5 times a week. If you develop chest pain, have severe difficulty breathing, or feel very tired, stop exercising immediately and seek medical attention  Immunization reviewed and updated. Cancer screening reviewed and updated    Patient Instructions (the written plan) was given to the patient.  Medicare Attestation  I have personally reviewed:  The patient's medical and social history  Their use of alcohol, tobacco or illicit drugs  Their current medications and supplements  The patient's functional ability including ADLs,fall risks, home safety risks, cognitive, and hearing and visual impairment  Diet and physical activities  Evidence for depression or mood disorders  The patient's weight, height, BMI, and visual acuity have been recorded in the chart. I have made referrals, counseling, and provided education to the patient based on review of the above and I have provided the patient with a written personalized care plan for preventive services.      Review of Systems     Objective:   Physical Exam  Patient alert and oriented and in no cardiopulmonary distress.  HEENT: No facial asymmetry, EOMI, no sinus tenderness,  oropharynx pink and moist.  Neck supple no adenopathy.  Chest: Clear to auscultation bilaterally.Decreased though adequate  air entry, no crackles or wheezes heard  CVS: S1, S2 no murmurs, no S3. 10mm fall in SBP on standing with no change in pulse  ABD: Soft non tender. Bowel sounds normal.  Ext: No edema  MS: Adequate ROM spine, shoulders, hips and reduced in  knees.  SPsych: Good eye contact, normal affect.  CNS: CN 2-12 intact,.       Assessment & Plan:

## 2012-08-26 NOTE — Patient Instructions (Addendum)
F/u early January.Call if you need me before  Call in October for flu vaccine  Fasting lipid, cmp and EGFr, HBA1C and microalb due end December  New medication for blood pressure, benazepril, will not get as light headed with this.  Please reduce cheese, red meat, so that your triglycerides.get better  Congrats on weight loss and improved blood sugar.  Keep eating from the garden  New for help with breathing are spiriva and symbicort.  Continue to work on smoking cessation, you do NEED to quit

## 2012-08-28 ENCOUNTER — Telehealth: Payer: Self-pay | Admitting: Family Medicine

## 2012-08-28 ENCOUNTER — Other Ambulatory Visit: Payer: Self-pay

## 2012-08-28 DIAGNOSIS — J0100 Acute maxillary sinusitis, unspecified: Secondary | ICD-10-CM

## 2012-08-28 MED ORDER — CEPHALEXIN 500 MG PO CAPS: 500.0000 mg | ORAL_CAPSULE | Freq: Four times a day (QID) | ORAL | Status: AC

## 2012-08-28 NOTE — Telephone Encounter (Signed)
Please advise 

## 2012-08-28 NOTE — Telephone Encounter (Signed)
Called and left patient a detailed message.

## 2012-08-28 NOTE — Telephone Encounter (Signed)
pls send in refill on keflex x 1 and let her know

## 2012-09-07 DIAGNOSIS — Z0000 Encounter for general adult medical examination without abnormal findings: Secondary | ICD-10-CM | POA: Insufficient documentation

## 2012-09-07 NOTE — Assessment & Plan Note (Signed)
Triglycerides elevated, reviewed low fat diet. No med change

## 2012-09-07 NOTE — Assessment & Plan Note (Addendum)
Chronic c/o chest congestion and sputum, no fever , no recent chills. Ongoing use of nicotine, explained no indication for antibiotic based on symptoms and exam findings. Decongestants and smoking cessation encouraged

## 2012-09-07 NOTE — Assessment & Plan Note (Signed)
Annual wellness exam completed as documented. She is able to live independently and care for herself. The importance of discontinuing all nicotine products is stressed, her med needs for COPD are also addressed

## 2012-09-07 NOTE — Assessment & Plan Note (Signed)
Associated with narcotic dependence, Reviewed treatment strategy to improve bowel movemnts, fiber, softener, physical activity, high water intake and laxative every 3 to 4 days if needed

## 2012-09-07 NOTE — Assessment & Plan Note (Signed)
Improved with dietary change and weight loss, pt is doing extremely well, she is applauded on this

## 2012-09-07 NOTE — Assessment & Plan Note (Signed)
Pt to start spiriva and symbicort to help with chronic chest congestion and dyspnea with activity

## 2012-09-07 NOTE — Assessment & Plan Note (Signed)
Overcorrected with light headedness with change in position. Discontinue diuretic, pt also advised to change position slowly

## 2012-09-09 DIAGNOSIS — M25519 Pain in unspecified shoulder: Secondary | ICD-10-CM | POA: Diagnosis not present

## 2012-09-09 DIAGNOSIS — I10 Essential (primary) hypertension: Secondary | ICD-10-CM | POA: Diagnosis not present

## 2012-09-19 ENCOUNTER — Other Ambulatory Visit: Payer: Self-pay | Admitting: Family Medicine

## 2012-10-02 ENCOUNTER — Encounter: Payer: Medicare Other | Admitting: Family Medicine

## 2012-10-03 ENCOUNTER — Other Ambulatory Visit: Payer: Self-pay | Admitting: Family Medicine

## 2012-10-23 ENCOUNTER — Other Ambulatory Visit: Payer: Self-pay | Admitting: Family Medicine

## 2012-11-05 ENCOUNTER — Other Ambulatory Visit: Payer: Self-pay | Admitting: Family Medicine

## 2012-11-12 ENCOUNTER — Encounter (INDEPENDENT_AMBULATORY_CARE_PROVIDER_SITE_OTHER): Payer: Self-pay

## 2012-11-12 ENCOUNTER — Ambulatory Visit (INDEPENDENT_AMBULATORY_CARE_PROVIDER_SITE_OTHER): Payer: Medicare Other

## 2012-11-12 DIAGNOSIS — Z23 Encounter for immunization: Secondary | ICD-10-CM | POA: Diagnosis not present

## 2012-11-14 ENCOUNTER — Other Ambulatory Visit: Payer: Self-pay | Admitting: Family Medicine

## 2012-11-20 ENCOUNTER — Telehealth: Payer: Self-pay | Admitting: Family Medicine

## 2012-11-21 NOTE — Telephone Encounter (Signed)
Message left for patient when exams are due

## 2012-11-21 NOTE — Telephone Encounter (Signed)
Can have thiose exams in August 2015, -pls explain

## 2012-11-23 ENCOUNTER — Emergency Department (HOSPITAL_COMMUNITY)
Admission: EM | Admit: 2012-11-23 | Discharge: 2012-11-23 | Discharge status: Against Medical Advice | Payer: Medicare Other | Attending: Emergency Medicine | Admitting: Emergency Medicine

## 2012-11-23 ENCOUNTER — Encounter (HOSPITAL_COMMUNITY): Payer: Self-pay | Admitting: Emergency Medicine

## 2012-11-23 DIAGNOSIS — R109 Unspecified abdominal pain: Secondary | ICD-10-CM | POA: Diagnosis not present

## 2012-11-23 DIAGNOSIS — F17200 Nicotine dependence, unspecified, uncomplicated: Secondary | ICD-10-CM | POA: Insufficient documentation

## 2012-11-23 DIAGNOSIS — I10 Essential (primary) hypertension: Secondary | ICD-10-CM | POA: Diagnosis not present

## 2012-11-23 DIAGNOSIS — J449 Chronic obstructive pulmonary disease, unspecified: Secondary | ICD-10-CM | POA: Diagnosis not present

## 2012-11-23 DIAGNOSIS — E119 Type 2 diabetes mellitus without complications: Secondary | ICD-10-CM | POA: Diagnosis not present

## 2012-11-23 DIAGNOSIS — J4489 Other specified chronic obstructive pulmonary disease: Secondary | ICD-10-CM | POA: Insufficient documentation

## 2012-11-23 NOTE — ED Notes (Signed)
Pt c/o abd distention to right abd area that started yesterday that is associated with nausea.

## 2012-11-24 ENCOUNTER — Telehealth: Payer: Self-pay

## 2012-11-24 NOTE — Telephone Encounter (Signed)
Spoke with pt and advised no openings until Monday and to go to urgent care if pain got worse. Scheduled for 9 Monday

## 2012-11-25 ENCOUNTER — Emergency Department (HOSPITAL_COMMUNITY): Payer: Medicare Other

## 2012-11-25 ENCOUNTER — Encounter (HOSPITAL_COMMUNITY): Payer: Self-pay | Admitting: Emergency Medicine

## 2012-11-25 ENCOUNTER — Emergency Department (HOSPITAL_COMMUNITY)
Admission: EM | Admit: 2012-11-25 | Discharge: 2012-11-26 | Disposition: A | Discharge status: Home / Self Care | Payer: Medicare Other | Attending: Emergency Medicine | Admitting: Emergency Medicine

## 2012-11-25 DIAGNOSIS — J4489 Other specified chronic obstructive pulmonary disease: Secondary | ICD-10-CM | POA: Insufficient documentation

## 2012-11-25 DIAGNOSIS — F17200 Nicotine dependence, unspecified, uncomplicated: Secondary | ICD-10-CM | POA: Insufficient documentation

## 2012-11-25 DIAGNOSIS — IMO0002 Reserved for concepts with insufficient information to code with codable children: Secondary | ICD-10-CM | POA: Diagnosis not present

## 2012-11-25 DIAGNOSIS — Z79899 Other long term (current) drug therapy: Secondary | ICD-10-CM | POA: Diagnosis not present

## 2012-11-25 DIAGNOSIS — N201 Calculus of ureter: Secondary | ICD-10-CM | POA: Diagnosis not present

## 2012-11-25 DIAGNOSIS — Z90710 Acquired absence of both cervix and uterus: Secondary | ICD-10-CM | POA: Diagnosis not present

## 2012-11-25 DIAGNOSIS — R6883 Chills (without fever): Secondary | ICD-10-CM | POA: Diagnosis not present

## 2012-11-25 DIAGNOSIS — N83209 Unspecified ovarian cyst, unspecified side: Secondary | ICD-10-CM | POA: Diagnosis not present

## 2012-11-25 DIAGNOSIS — E119 Type 2 diabetes mellitus without complications: Secondary | ICD-10-CM | POA: Diagnosis not present

## 2012-11-25 DIAGNOSIS — R1906 Epigastric swelling, mass or lump: Secondary | ICD-10-CM | POA: Diagnosis not present

## 2012-11-25 DIAGNOSIS — Z880 Allergy status to penicillin: Secondary | ICD-10-CM | POA: Insufficient documentation

## 2012-11-25 DIAGNOSIS — J449 Chronic obstructive pulmonary disease, unspecified: Secondary | ICD-10-CM | POA: Insufficient documentation

## 2012-11-25 DIAGNOSIS — I10 Essential (primary) hypertension: Secondary | ICD-10-CM | POA: Insufficient documentation

## 2012-11-25 DIAGNOSIS — S298XXA Other specified injuries of thorax, initial encounter: Secondary | ICD-10-CM | POA: Insufficient documentation

## 2012-11-25 DIAGNOSIS — E785 Hyperlipidemia, unspecified: Secondary | ICD-10-CM | POA: Insufficient documentation

## 2012-11-25 DIAGNOSIS — Z9851 Tubal ligation status: Secondary | ICD-10-CM | POA: Diagnosis not present

## 2012-11-25 DIAGNOSIS — N838 Other noninflammatory disorders of ovary, fallopian tube and broad ligament: Secondary | ICD-10-CM | POA: Insufficient documentation

## 2012-11-25 DIAGNOSIS — N23 Unspecified renal colic: Secondary | ICD-10-CM | POA: Insufficient documentation

## 2012-11-25 DIAGNOSIS — Z853 Personal history of malignant neoplasm of breast: Secondary | ICD-10-CM | POA: Insufficient documentation

## 2012-11-25 DIAGNOSIS — M1990 Unspecified osteoarthritis, unspecified site: Secondary | ICD-10-CM | POA: Insufficient documentation

## 2012-11-25 DIAGNOSIS — R079 Chest pain, unspecified: Secondary | ICD-10-CM | POA: Diagnosis not present

## 2012-11-25 DIAGNOSIS — C50919 Malignant neoplasm of unspecified site of unspecified female breast: Secondary | ICD-10-CM | POA: Diagnosis not present

## 2012-11-25 DIAGNOSIS — K8020 Calculus of gallbladder without cholecystitis without obstruction: Secondary | ICD-10-CM | POA: Diagnosis not present

## 2012-11-25 DIAGNOSIS — N200 Calculus of kidney: Secondary | ICD-10-CM | POA: Diagnosis not present

## 2012-11-25 DIAGNOSIS — Y9389 Activity, other specified: Secondary | ICD-10-CM | POA: Insufficient documentation

## 2012-11-25 DIAGNOSIS — Y9289 Other specified places as the place of occurrence of the external cause: Secondary | ICD-10-CM | POA: Insufficient documentation

## 2012-11-25 DIAGNOSIS — W64XXXA Exposure to other animate mechanical forces, initial encounter: Secondary | ICD-10-CM | POA: Insufficient documentation

## 2012-11-25 DIAGNOSIS — R109 Unspecified abdominal pain: Secondary | ICD-10-CM | POA: Diagnosis not present

## 2012-11-25 LAB — URINALYSIS W MICROSCOPIC + REFLEX CULTURE
Bilirubin Urine: NEGATIVE
Hgb urine dipstick: NEGATIVE
Ketones, ur: NEGATIVE mg/dL
Nitrite: NEGATIVE
Protein, ur: NEGATIVE mg/dL
Urobilinogen, UA: 0.2 mg/dL (ref 0.0–1.0)

## 2012-11-25 LAB — CBC WITH DIFFERENTIAL/PLATELET
Blasts: 0 %
HCT: 46.2 % — ABNORMAL HIGH (ref 36.0–46.0)
Lymphocytes Relative: 27 % (ref 12–46)
Lymphs Abs: 3.6 10*3/uL (ref 0.7–4.0)
MCHC: 33.1 g/dL (ref 30.0–36.0)
MCV: 89.7 fL (ref 78.0–100.0)
Metamyelocytes Relative: 0 %
Monocytes Absolute: 1.3 10*3/uL — ABNORMAL HIGH (ref 0.1–1.0)
Monocytes Relative: 10 % (ref 3–12)
Platelets: 362 10*3/uL (ref 150–400)
RBC: 5.15 MIL/uL — ABNORMAL HIGH (ref 3.87–5.11)
RDW: 13.6 % (ref 11.5–15.5)
WBC: 13.4 10*3/uL — ABNORMAL HIGH (ref 4.0–10.5)
nRBC: 0 /100 WBC

## 2012-11-25 LAB — COMPREHENSIVE METABOLIC PANEL
ALT: 15 U/L (ref 0–35)
CO2: 29 mEq/L (ref 19–32)
Calcium: 10.7 mg/dL — ABNORMAL HIGH (ref 8.4–10.5)
Creatinine, Ser: 0.83 mg/dL (ref 0.50–1.10)
GFR calc Af Amer: 83 mL/min — ABNORMAL LOW (ref >90)
GFR calc non Af Amer: 71 mL/min — ABNORMAL LOW (ref >90)
Glucose, Bld: 90 mg/dL (ref 70–99)
Potassium: 4 mEq/L (ref 3.5–5.1)
Sodium: 139 mEq/L (ref 135–145)
Total Bilirubin: 0.4 mg/dL (ref 0.3–1.2)

## 2012-11-25 MED ORDER — MORPHINE SULFATE 2 MG/ML IJ SOLN
2.0000 mg | INTRAMUSCULAR | Status: DC | PRN
  Administered 2012-11-25: 2 mg via INTRAVENOUS
  Filled 2012-11-25: qty 1

## 2012-11-25 MED ORDER — HYDROCODONE-ACETAMINOPHEN 5-325 MG PO TABS: ORAL_TABLET | ORAL | Status: DC

## 2012-11-25 MED ORDER — ONDANSETRON HCL 4 MG PO TABS: 4.0000 mg | ORAL_TABLET | Freq: Three times a day (TID) | ORAL | Status: DC | PRN

## 2012-11-25 MED ORDER — ONDANSETRON HCL 4 MG/2ML IJ SOLN
4.0000 mg | INTRAMUSCULAR | Status: DC | PRN
  Administered 2012-11-25: 4 mg via INTRAVENOUS
  Filled 2012-11-25: qty 2

## 2012-11-25 MED ORDER — IOHEXOL 300 MG/ML  SOLN
100.0000 mL | Freq: Once | INTRAMUSCULAR | Status: AC | PRN
  Administered 2012-11-25: 100 mL via INTRAVENOUS

## 2012-11-25 MED ORDER — IOHEXOL 300 MG/ML  SOLN
50.0000 mL | Freq: Once | INTRAMUSCULAR | Status: AC | PRN
  Administered 2012-11-25: 50 mL via ORAL

## 2012-11-25 NOTE — ED Notes (Signed)
Patient c/o right upper abd pain/rib pain x5-6 days. Per patient large 150lb dog landed on ribs 5 days ago. Patient also c/o abd distension and chills.

## 2012-11-25 NOTE — ED Provider Notes (Signed)
CSN: [id number]     Arrival date & time 11/25/12  1706 History   First MD Initiated Contact with Patient 11/25/12 1734     Chief Complaint  Patient presents with  . rib pain    . Abdominal Pain  . Chills    HPI Pt was seen at 1750. Per pt, c/o gradual onset and persistence of constant right lower ribs and right sided abd "pain" for the past 5 to 6 days. States the pain began after "my 150# dog jumped on me." Has been associated with chills, nausea and "feeling bloated." Pain worsens with palpation of the area and body position changes. Denies rash, no fevers, no CP/palpitations, no SOB/cough, no vomiting/diarrhea, no back pain.    Past Medical History  Diagnosis Date  . Hypertension   . Breast cancer, left breast   . Nicotine dependence   . COPD (chronic obstructive pulmonary disease)   . Osteoarthritis   . Kidney stones   . Diverticula of colon   . Hyperlipidemia   . Breast cancer 03/11/2012    Stage I (T1b N0 M0), grade 1 well-differentiated carcinoma of the left breast status, post lumpectomy followed by radiation therapy. Her estrogen receptor receptors were 93%, progesterone receptors 67%. HER-2/neu was negative. No lymphovascular space invasion was seen. All margins were clear. Ki-67 marker was low at 1% with surgery on 11/15/2004. Treated then with post-lumpectomy radiation, finish  . Diabetes mellitus without complication    Past Surgical History  Procedure Laterality Date  . Abdominal hysterectomy    . Tubal ligation    . Left breast       cancer, in 2006  . Colectomy      2005, diverticulitis  . Breast surgery  2005 approx    left lumpectomy    Family History  Problem Relation Age of Onset  . Cancer Mother     breast  . COPD Father   . Cancer Sister     cancer   History  Substance Use Topics  . Smoking status: Current Every Day Smoker -- 0.50 packs/day for 50 years    Types: Cigarettes  . Smokeless tobacco: Not on file     Comment: Using E-Cig  . Alcohol  Use: No   OB History   Grav Para Term Preterm Abortions TAB SAB Ect Mult Living   8 5 5  3  3   2      Review of Systems ROS: Statement: All systems negative except as marked or noted in the HPI; Constitutional: Negative for fever and +chills. ; ; Eyes: Negative for eye pain, redness and discharge. ; ; ENMT: Negative for ear pain, hoarseness, nasal congestion, sinus pressure and sore throat. ; ; Cardiovascular: Negative for palpitations, diaphoresis, dyspnea and peripheral edema. ; ; Respiratory: Negative for cough, wheezing and stridor. ; ; Gastrointestinal: +nausea, abd pain. Negative for vomiting, diarrhea, blood in stool, hematemesis, jaundice and rectal bleeding. . ; ; Genitourinary: Negative for dysuria, flank pain and hematuria. ; ; Musculoskeletal: +right sided ribs pain. Negative for back pain and neck pain. Negative for swelling and trauma.; ; Skin: Negative for pruritus, rash, abrasions, blisters, bruising and skin lesion.; ; Neuro: Negative for headache, lightheadedness and neck stiffness. Negative for weakness, altered level of consciousness , altered mental status, extremity weakness, paresthesias, involuntary movement, seizure and syncope.      Allergies  Penicillins and Sulfonamide derivatives  Home Medications   Current Outpatient Rx  Name  Route  Sig  Dispense  Refill  . ATROVENT HFA 17 MCG/ACT inhaler      INHALE 2 PUFFS FOUR TIMES A DAY.   12.9 g   3   . benazepril (LOTENSIN) 20 MG tablet   Oral   Take 1 tablet (20 mg total) by mouth daily.   30 tablet   5     Discontinue benazepril/HCTZ effective 08/26/2012 p ...   . benzonatate (TESSALON PERLES) 100 MG capsule   Oral   Take 1 capsule (100 mg total) by mouth every 6 (six) hours as needed for cough.   30 capsule   0   . budesonide-formoterol (SYMBICORT) 80-4.5 MCG/ACT inhaler   Inhalation   Inhale 2 puffs into the lungs 2 (two) times daily.   1 Inhaler   12   . Calcium Carbonate-Vitamin D (CALCIUM 600 +  D PO)   Oral   Take by mouth 2 (two) times daily.         Marland Kitchen glucose blood (ONE TOUCH ULTRA TEST) test strip      Use as instructed once daily DX 250.00   32 each   5   . HYDROcodone-acetaminophen (NORCO) 7.5-325 MG per tablet   Oral   Take 1 tablet by mouth every 4 (four) hours as needed.           Marland Kitchen ibuprofen (ADVIL,MOTRIN) 200 MG tablet   Oral   Take 200 mg by mouth every 6 (six) hours as needed for pain.         Marland Kitchen ipratropium-albuterol (DUONEB) 0.5-2.5 (3) MG/3ML SOLN   Nebulization   Take 3 mLs by nebulization daily as needed.         . loratadine (CLARITIN) 10 MG tablet      TAKE 1 TABLET BY MOUTH ONCE DAILY FOR ALLERGIES.   30 tablet   4   . metFORMIN (GLUCOPHAGE) 500 MG tablet      TAKE 1 TABLET BY MOUTH DAILY WITH BREAKFAST.   30 tablet   2   . ONETOUCH DELICA LANCETS MISC      Once daily testing DX 250.00   100 each   5   . polyethylene glycol powder (GLYCOLAX/MIRALAX) powder      MIX 1 CAPFUL IN 8 OUNCES OF JUICE OR WATER AND DRINK ONCE DAILY.   527 g   2   . potassium chloride SA (K-DUR,KLOR-CON) 20 MEQ tablet      TAKE 1 TABLET BY MOUTH DAILY.   30 tablet   4   . simvastatin (ZOCOR) 20 MG tablet      TAKE (1) TABLET BY MOUTH AT BEDTIME FOR CHOLESTEROL.   30 tablet   3   . tiotropium (SPIRIVA HANDIHALER) 18 MCG inhalation capsule   Inhalation   Place 1 capsule (18 mcg total) into inhaler and inhale daily.   30 capsule   2   . tiZANidine (ZANAFLEX) 4 MG capsule   Oral   Take 4 mg by mouth every 8 (eight) hours as needed for muscle spasms.          BP 193/83  Pulse 95  Temp(Src) 99 F (37.2 C) (Oral)  SpO2 93% Physical Exam 1755: Physical examination:  Nursing notes reviewed; Vital signs and O2 SAT reviewed;  Constitutional: Well developed, Well nourished, Well hydrated, In no acute distress; Head:  Normocephalic, atraumatic; Eyes: EOMI, PERRL, No scleral icterus; ENMT: Mouth and pharynx normal, Mucous membranes moist;  Neck: Supple, Full range of motion, No lymphadenopathy; Cardiovascular: Regular rate and  rhythm, No gallop; Respiratory: Breath sounds clear & equal bilaterally, No rales, rhonchi, wheezes.  Speaking full sentences with ease, Normal respiratory effort/excursion; Chest: +right anterior lateral lower ribs tender to palp. No soft tissue crepitus, no rash, no ecchymosis, no erythema. Movement normal; Abdomen: Soft, +RUQ and mid-epigastric areas tender to palp. No rebound or guarding. Nondistended, Normal bowel sounds; Genitourinary: No CVA tenderness; Extremities: Pulses normal, No tenderness, No edema, No calf edema or asymmetry.; Neuro: AA&Ox3, Major CN grossly intact. Speech clear. Climbs on and off stretcher easily by herself. Gait steady. No gross focal motor or sensory deficits in extremities.; Skin: Color normal, Warm, Dry.   ED Course  Procedures   EKG Interpretation   None       MDM  MDM Reviewed: previous chart, nursing note and vitals Reviewed previous: labs Interpretation: labs, x-ray and CT scan     Results for orders placed during the hospital encounter of 11/25/12  CBC WITH DIFFERENTIAL      Result Value Range   WBC 13.4 (*) 4.0 - 10.5 K/uL   RBC 5.15 (*) 3.87 - 5.11 MIL/uL   Hemoglobin 15.3 (*) 12.0 - 15.0 g/dL   HCT 78.2 (*) 95.6 - 21.3 %   MCV 89.7  78.0 - 100.0 fL   MCH 29.7  26.0 - 34.0 pg   MCHC 33.1  30.0 - 36.0 g/dL   RDW 08.6  57.8 - 46.9 %   Platelets 362  150 - 400 K/uL   Neutrophils Relative % 62  43 - 77 %   Lymphocytes Relative 27  12 - 46 %   Monocytes Relative 10  3 - 12 %   Eosinophils Relative 0  0 - 5 %   Basophils Relative 1  0 - 1 %   Band Neutrophils 0  0 - 10 %   Metamyelocytes Relative 0     Myelocytes 0     Promyelocytes Absolute 0     Blasts 0     nRBC 0  0 /100 WBC   Neutro Abs 8.4 (*) 1.7 - 7.7 K/uL   Lymphs Abs 3.6  0.7 - 4.0 K/uL   Monocytes Absolute 1.3 (*) 0.1 - 1.0 K/uL   Eosinophils Absolute 0.0  0.0 - 0.7 K/uL   Basophils  Absolute 0.1  0.0 - 0.1 K/uL   WBC Morphology ATYPICAL LYMPHOCYTES    COMPREHENSIVE METABOLIC PANEL      Result Value Range   Sodium 139  135 - 145 mEq/L   Potassium 4.0  3.5 - 5.1 mEq/L   Chloride 99  96 - 112 mEq/L   CO2 29  19 - 32 mEq/L   Glucose, Bld 90  70 - 99 mg/dL   BUN 12  6 - 23 mg/dL   Creatinine, Ser 6.29  0.50 - 1.10 mg/dL   Calcium 52.8 (*) 8.4 - 10.5 mg/dL   Total Protein 9.0 (*) 6.0 - 8.3 g/dL   Albumin 4.6  3.5 - 5.2 g/dL   AST 19  0 - 37 U/L   ALT 15  0 - 35 U/L   Alkaline Phosphatase 81  39 - 117 U/L   Total Bilirubin 0.4  0.3 - 1.2 mg/dL   GFR calc non Af Amer 71 (*) >90 mL/min   GFR calc Af Amer 83 (*) >90 mL/min  LIPASE, BLOOD      Result Value Range   Lipase 24  11 - 59 U/L  URINALYSIS W MICROSCOPIC + REFLEX CULTURE  Result Value Range   Color, Urine YELLOW  YELLOW   APPearance CLEAR  CLEAR   Specific Gravity, Urine 1.010  1.005 - 1.030   pH 6.0  5.0 - 8.0   Glucose, UA NEGATIVE  NEGATIVE mg/dL   Hgb urine dipstick NEGATIVE  NEGATIVE   Bilirubin Urine NEGATIVE  NEGATIVE   Ketones, ur NEGATIVE  NEGATIVE mg/dL   Protein, ur NEGATIVE  NEGATIVE mg/dL   Urobilinogen, UA 0.2  0.0 - 1.0 mg/dL   Nitrite NEGATIVE  NEGATIVE   Leukocytes, UA TRACE (*) NEGATIVE   WBC, UA 0-2  <3 WBC/hpf   Dg Ribs Unilateral W/chest Right 11/25/2012   CLINICAL DATA:  Right upper abdomen and rib pain for the past 5-6 days following an injury.  EXAM: RIGHT RIBS AND CHEST - 3+ VIEW  COMPARISON:  Chest radiographs dated 01/28/2012 and more recent chest CT examinations.  FINDINGS: Borderline enlarged cardiac silhouette. The lungs remain hyperexpanded with diffuse peribronchial thickening and prominent interstitial markings. Old, healed right 5th rib fracture. No acute fracture pneumothorax. Diffuse osteopenia. Thoracic spine degenerative changes.  IMPRESSION: No acute abnormality. Stable changes of COPD and chronic bronchitis.   Electronically Signed   By: Gordan Payment M.D.   On:  11/25/2012 19:50   US Transvaginal Non-ob 11/25/2012   CLINICAL DATA:  Postmenopausal female with cystic adnexal mass seen on CT.  EXAM: TRANSABDOMINAL AND TRANSVAGINAL ULTRASOUND OF PELVIS  TECHNIQUE: Both transabdominal and transvaginal ultrasound examinations of the pelvis were performed. Transabdominal technique was performed for global imaging of the pelvis including uterus, ovaries, adnexal regions, and pelvic cul-de-sac. It was necessary to proceed with endovaginal exam following the transabdominal exam to visualize the ovaries and adnexae.  COMPARISON:  None  FINDINGS: Uterus  Measurements: Surgically absent. Vaginal cuff is unremarkable in appearance .  Endometrium  Thickness: Not applicable.  Right ovary  Measurements: No normal ovary visualized. A complex cystic lesion with multiple thin septations is seen, which measures 4.6 by 2.0 x 3.0 cm. No thickened septations, mural nodules, or internal blood flow seen on color Doppler ultrasound. This is suspicious for a hydrosalpinx although a cystic ovarian neoplasm cannot definitely be excluded.  Left ovary  Measurements: Not directly visualized by transabdominal or transvaginal sonography, however no adnexal mass identified.  Other findings  No free fluid.  IMPRESSION: 4.6 cm complex cystic lesion in the right adnexa, which has indeterminate but probably benign characteristics. Although a hydrosalpinx is suspected, a cystic ovarian neoplasm cannot definitely be excluded. Consider surgical evaluation or pelvic MRI without and with contrast for further characterization. This recommendation follows the consensus statement: Management of Asymptomatic Ovarian and Other Adnexal Cysts Imaged at [place]: Society of Radiologists in Ultrasound Consensus Conference Statement. Radiology 2010; [phone].   Electronically Signed   By: Myles Rosenthal M.D.   On: 11/25/2012 21:34   Ct Abdomen Pelvis W Contrast 11/25/2012   CLINICAL DATA:  Right-sided abdominal pain. Abdominal  distention. Diverticulitis. Breast carcinoma.  EXAM: CT ABDOMEN AND PELVIS WITH CONTRAST  TECHNIQUE: Multidetector CT imaging of the abdomen and pelvis was performed using the standard protocol following bolus administration of intravenous contrast.  CONTRAST:  50mL OMNIPAQUE IOHEXOL 300 MG/ML SOLN, OMNIPAQUE IOHEXOL 300 MG/ML SOLN  COMPARISON:  None.  FINDINGS: Cholelithiasis is demonstrated, however there is no evidence of acute cholecystitis or biliary dilatation. The liver, pancreas, spleen, and adrenal glands are normal in appearance.  Small less than 5 mm nonobstructive renal calculi are noted bilaterally. There is mild right  hydronephrosis due to a 5 mm calculus at the right ureteropelvic junction.  No evidence of abdominal or pelvic lymphadenopathy. Prior hysterectomy noted. A cystic lesion is seen in the right adnexa which measures approximately 2 x 4 cm and contains a few thin septations and has higher than simple fluid attenuation. This could represent a cystic ovarian mass or hydrosalpinx. No evidence of ascites. No evidence of inflammatory process or abscess.  IMPRESSION: Mild right hydronephrosis due to 5 mm calculus at the right ureteropelvic junction.  Nonobstructive bilateral nephrolithiasis.  Cholelithiasis. No radiographic evidence of cholecystitis.  2 x 4 cm complex cystic lesion in right adnexa, which could represent a cystic ovarian mass or hydrosalpinx. Pelvic ultrasound is recommended for further evaluation. This recommendation follows ACR consensus guidelines: White Paper of the ACR Incidental Findings Committee II on Adnexal Findings. J Am Coll Radiol [phone].   Electronically Signed   By: Myles Rosenthal M.D.   On: 11/25/2012 19:38     2215:  Pt states she feels better after meds and wants to go home now. Will tx ureteral calculi symptomatically. Pt strongly encouraged to f/u OB/GYN MD for right ovarian mass. Pt verb understanding, stating she can "see Dr. Emelda Fear." Dx and  testing d/w pt.  Questions answered.  Verb understanding, agreeable to d/c home with outpt f/u.   Laray Anger, DO 11/29/12 1253

## 2012-11-26 ENCOUNTER — Telehealth: Payer: Self-pay | Admitting: Family Medicine

## 2012-11-27 ENCOUNTER — Other Ambulatory Visit: Payer: Self-pay | Admitting: Family Medicine

## 2012-11-27 DIAGNOSIS — N9489 Other specified conditions associated with female genital organs and menstrual cycle: Secondary | ICD-10-CM

## 2012-11-27 NOTE — Telephone Encounter (Signed)
Referral entered pls arrange, [place] report is in the system

## 2012-12-01 ENCOUNTER — Telehealth: Payer: Self-pay

## 2012-12-01 ENCOUNTER — Telehealth: Payer: Self-pay | Admitting: Family Medicine

## 2012-12-01 ENCOUNTER — Other Ambulatory Visit: Payer: Self-pay | Admitting: Family Medicine

## 2012-12-01 ENCOUNTER — Ambulatory Visit: Payer: Medicare Other | Admitting: Family Medicine

## 2012-12-01 ENCOUNTER — Telehealth: Payer: Self-pay | Admitting: *Deleted

## 2012-12-01 DIAGNOSIS — J449 Chronic obstructive pulmonary disease, unspecified: Secondary | ICD-10-CM

## 2012-12-01 DIAGNOSIS — R918 Other nonspecific abnormal finding of lung field: Secondary | ICD-10-CM

## 2012-12-01 MED ORDER — PROMETHAZINE-DM 6.25-15 MG/5ML PO SYRP: ORAL_SOLUTION | ORAL | Status: DC

## 2012-12-01 NOTE — Telephone Encounter (Signed)
Wants refill of zofran- was given 6 in the ER

## 2012-12-01 NOTE — Telephone Encounter (Signed)
Patient aware and med sent in  

## 2012-12-01 NOTE — Telephone Encounter (Signed)
pls explain kidney stones are not treated with antibiotics. I have entered phenergan dm, for bedtime use only for cough pls send after you speak with ehr

## 2012-12-01 NOTE — Telephone Encounter (Signed)
Please advise.  I believe patient is referring to last ED visit.

## 2012-12-01 NOTE — Telephone Encounter (Addendum)
Message copied by Maisie Fus on Mon Dec 01, 2012  5:20 PM ------      Message from: Maisie Fus      Created: Thu Sep 04, 2012  2:19 PM      Regarding: Needs appointment       Patient needs ONE YEAR follow up CT      Aug 2015 ------  Order placed.

## 2012-12-02 ENCOUNTER — Other Ambulatory Visit (HOSPITAL_COMMUNITY): Payer: Self-pay | Admitting: Urology

## 2012-12-02 ENCOUNTER — Ambulatory Visit (HOSPITAL_COMMUNITY)
Admission: RE | Admit: 2012-12-02 | Discharge: 2012-12-02 | Disposition: A | Discharge status: Home / Self Care | Payer: Medicare Other | Source: Ambulatory Visit | Attending: Urology | Admitting: Urology

## 2012-12-02 ENCOUNTER — Other Ambulatory Visit: Payer: Self-pay | Admitting: Family Medicine

## 2012-12-02 DIAGNOSIS — N200 Calculus of kidney: Secondary | ICD-10-CM

## 2012-12-02 DIAGNOSIS — R109 Unspecified abdominal pain: Secondary | ICD-10-CM | POA: Insufficient documentation

## 2012-12-02 DIAGNOSIS — N390 Urinary tract infection, site not specified: Secondary | ICD-10-CM | POA: Diagnosis not present

## 2012-12-02 DIAGNOSIS — N201 Calculus of ureter: Secondary | ICD-10-CM | POA: Insufficient documentation

## 2012-12-02 MED ORDER — ONDANSETRON HCL 4 MG PO TABS: ORAL_TABLET | ORAL | Status: AC

## 2012-12-02 NOTE — Telephone Encounter (Signed)
She has gallstones based on recent CT scan report. If she is having chronic nausea, I suggest she see a surgeon to have her gallbladder removed. Pls let me know if she agrees. I will refer her to Dr Lovell Sheehan, unless she has another preference. I will send in 20 zofran , needs to get the underlying prob addressed

## 2012-12-02 NOTE — Telephone Encounter (Signed)
Called patient and left message for them to return call at the office   

## 2012-12-03 ENCOUNTER — Telehealth: Payer: Self-pay | Admitting: Family Medicine

## 2012-12-03 NOTE — Telephone Encounter (Signed)
No other nausea med to be sent in per Dr. Rock Nephew refused the surgeon referral. Will check with [facility]

## 2012-12-03 NOTE — Telephone Encounter (Signed)
Patient aware to check with [facility] office

## 2012-12-03 NOTE — Telephone Encounter (Signed)
Noted, I will not be continually refilling zofran

## 2012-12-03 NOTE — Telephone Encounter (Signed)
Patient has so much going on she does NOT want the referral to [facility] at this time

## 2012-12-08 ENCOUNTER — Ambulatory Visit: Payer: Medicare Other | Admitting: Family Medicine

## 2012-12-08 ENCOUNTER — Encounter: Payer: Self-pay | Admitting: Obstetrics and Gynecology

## 2012-12-09 DIAGNOSIS — Z87442 Personal history of urinary calculi: Secondary | ICD-10-CM | POA: Diagnosis not present

## 2012-12-09 DIAGNOSIS — Z923 Personal history of irradiation: Secondary | ICD-10-CM | POA: Diagnosis not present

## 2012-12-09 DIAGNOSIS — N201 Calculus of ureter: Secondary | ICD-10-CM | POA: Diagnosis not present

## 2012-12-09 DIAGNOSIS — Z79899 Other long term (current) drug therapy: Secondary | ICD-10-CM | POA: Diagnosis not present

## 2012-12-09 DIAGNOSIS — Z882 Allergy status to sulfonamides status: Secondary | ICD-10-CM | POA: Diagnosis not present

## 2012-12-09 DIAGNOSIS — I10 Essential (primary) hypertension: Secondary | ICD-10-CM | POA: Diagnosis not present

## 2012-12-09 DIAGNOSIS — M1990 Unspecified osteoarthritis, unspecified site: Secondary | ICD-10-CM | POA: Diagnosis not present

## 2012-12-09 DIAGNOSIS — F17200 Nicotine dependence, unspecified, uncomplicated: Secondary | ICD-10-CM | POA: Diagnosis not present

## 2012-12-09 DIAGNOSIS — E785 Hyperlipidemia, unspecified: Secondary | ICD-10-CM | POA: Diagnosis not present

## 2012-12-09 DIAGNOSIS — Z01818 Encounter for other preprocedural examination: Secondary | ICD-10-CM | POA: Diagnosis not present

## 2012-12-09 DIAGNOSIS — Z853 Personal history of malignant neoplasm of breast: Secondary | ICD-10-CM | POA: Diagnosis not present

## 2012-12-09 DIAGNOSIS — IMO0002 Reserved for concepts with insufficient information to code with codable children: Secondary | ICD-10-CM | POA: Diagnosis not present

## 2012-12-09 DIAGNOSIS — Z880 Allergy status to penicillin: Secondary | ICD-10-CM | POA: Diagnosis not present

## 2012-12-09 DIAGNOSIS — J449 Chronic obstructive pulmonary disease, unspecified: Secondary | ICD-10-CM | POA: Diagnosis not present

## 2012-12-09 DIAGNOSIS — E119 Type 2 diabetes mellitus without complications: Secondary | ICD-10-CM | POA: Diagnosis not present

## 2012-12-10 ENCOUNTER — Other Ambulatory Visit: Payer: Self-pay | Admitting: Family Medicine

## 2012-12-17 DIAGNOSIS — N200 Calculus of kidney: Secondary | ICD-10-CM | POA: Diagnosis not present

## 2012-12-17 DIAGNOSIS — R935 Abnormal findings on diagnostic imaging of other abdominal regions, including retroperitoneum: Secondary | ICD-10-CM | POA: Diagnosis not present

## 2012-12-18 DIAGNOSIS — N200 Calculus of kidney: Secondary | ICD-10-CM | POA: Diagnosis not present

## 2012-12-19 ENCOUNTER — Telehealth: Payer: Self-pay | Admitting: Family Medicine

## 2012-12-19 NOTE — Telephone Encounter (Signed)
Patient is aware 

## 2012-12-23 ENCOUNTER — Ambulatory Visit: Payer: Medicare Other | Admitting: Family Medicine

## 2012-12-30 ENCOUNTER — Ambulatory Visit: Payer: Medicare Other | Admitting: Family Medicine

## 2013-01-05 ENCOUNTER — Ambulatory Visit (INDEPENDENT_AMBULATORY_CARE_PROVIDER_SITE_OTHER): Payer: Medicare Other | Admitting: Obstetrics and Gynecology

## 2013-01-05 ENCOUNTER — Encounter: Payer: Self-pay | Admitting: Obstetrics and Gynecology

## 2013-01-05 VITALS — BP 150/84 | Ht 65.0 in | Wt 166.2 lb

## 2013-01-05 DIAGNOSIS — R1900 Intra-abdominal and pelvic swelling, mass and lump, unspecified site: Secondary | ICD-10-CM

## 2013-01-05 DIAGNOSIS — D3911 Neoplasm of uncertain behavior of right ovary: Secondary | ICD-10-CM

## 2013-01-05 DIAGNOSIS — N839 Noninflammatory disorder of ovary, fallopian tube and broad ligament, unspecified: Secondary | ICD-10-CM | POA: Diagnosis not present

## 2013-01-05 DIAGNOSIS — D3910 Neoplasm of uncertain behavior of unspecified ovary: Secondary | ICD-10-CM | POA: Diagnosis not present

## 2013-01-05 NOTE — Patient Instructions (Signed)
We will check a Ca-125 lever today, and if normal, recheck u/s and Ca-125 in 3months, and then 6 months, then a year, and if normal x 4,  Can quit following

## 2013-01-05 NOTE — Addendum Note (Signed)
Addended by: Tilda Burrow on: 01/05/2013 02:36 PM   Modules accepted: Orders

## 2013-01-05 NOTE — Progress Notes (Signed)
     [facility] Visit  Patient name: [name] MRN [id number]  Date of birth: 1945/10/18  CC & HPI:  [name] is a 67 y.o. female presenting today for referral from Dr simpson and Hospital after CT showed a right adnexal mass and u/s suggests a hydrosalpinx as likely cause  ROS:  assmyptomatic  Pertinent History Reviewed:  Medical & Surgical Hx:  Reviewed: Significant for hysterectomy vaginal, left tubes and ovaries. Medications: Reviewed & Updated - see associated section Social History: Reviewed -  reports that she has been smoking Cigarettes.  She has a 25 pack-year smoking history. She uses smokeless tobacco.  Objective Findings:  Vitals: BP 150/84  Ht 5\' 5"  (1.651 m)  Wt 166 lb 3.2 oz (75.388 kg)  BMI 27.66 kg/m2  Physical Examination: General appearance - alert, well appearing, and in no distress, oriented to person, place, and time and overweight Mental status - alert, oriented to person, place, and time, normal mood, behavior, speech, dress, motor activity, and thought processes General appearance - alert, well appearing, and in no distress  MAbdomen - soft, nontender, nondistended, no masses or organomegaly   Breasts -    Pelvic - VULVA: normal appearing vulva with no masses, tenderness or lesions, VAGINA: normal appearing vagina with normal color and discharge, no lesions, UTERUS: surgically absent, vaginal cuff well healed   Rectal - negative without mass, lesions or tenderness  Bimanual normal Pelvic u/s:IMPRESSION:  4.6 cm complex cystic lesion in the right adnexa, which has  indeterminate but probably benign characteristics. Although a  hydrosalpinx is suspected, a cystic ovarian neoplasm cannot  definitely be excluded. Consider surgical evaluation or pelvic MRI  without and with contrast for further characterization. This  recommendation follows the consensus statement: Management of  Asymptomatic Ovarian and Other Adnexal Cysts Imaged  at [place]: Society  of Radiologists in Ultrasound Consensus Conference Statement.  Radiology 2010; [id number].  Electronically Signed  By: Myles Rosenthal M.D.  On: 11/25/2012 21:34     Back exam - not examined   Neurological - alert, oriented, normal speech, no focal findings or movement disorder noted   Musculoskeletal -    Extremities -    Skin -   Assessment & Plan:   Cystic pelvic mass assympotomatic after vag hyst.  Probable right hydrosalpinx Plan" Check Ca125 now , if normal repeat lab and u/s in 3 mo then in 6  Months , then a year , then stop

## 2013-01-07 DIAGNOSIS — I10 Essential (primary) hypertension: Secondary | ICD-10-CM | POA: Diagnosis not present

## 2013-01-07 DIAGNOSIS — M25519 Pain in unspecified shoulder: Secondary | ICD-10-CM | POA: Diagnosis not present

## 2013-01-11 ENCOUNTER — Telehealth: Payer: Self-pay | Admitting: Obstetrics and Gynecology

## 2013-01-12 NOTE — Telephone Encounter (Signed)
Not available

## 2013-01-14 ENCOUNTER — Telehealth: Payer: Self-pay | Admitting: *Deleted

## 2013-01-14 NOTE — Telephone Encounter (Signed)
Pt informed of CA 125 normal per Dr. Emelda Fear note. Pt to follow up in 3 months. Pt verbalized understanding.

## 2013-01-19 ENCOUNTER — Other Ambulatory Visit (HOSPITAL_COMMUNITY): Payer: Self-pay | Admitting: Urology

## 2013-01-19 DIAGNOSIS — N201 Calculus of ureter: Secondary | ICD-10-CM

## 2013-01-21 ENCOUNTER — Ambulatory Visit (HOSPITAL_COMMUNITY): Payer: Medicare Other

## 2013-01-23 ENCOUNTER — Other Ambulatory Visit (HOSPITAL_COMMUNITY): Payer: Self-pay | Admitting: Urology

## 2013-01-23 ENCOUNTER — Ambulatory Visit (HOSPITAL_COMMUNITY)
Admission: RE | Admit: 2013-01-23 | Discharge: 2013-01-23 | Disposition: A | Discharge status: Home / Self Care | Payer: Medicare Other | Source: Ambulatory Visit | Attending: Urology | Admitting: Urology

## 2013-01-23 DIAGNOSIS — Z09 Encounter for follow-up examination after completed treatment for conditions other than malignant neoplasm: Secondary | ICD-10-CM | POA: Diagnosis not present

## 2013-01-23 DIAGNOSIS — N200 Calculus of kidney: Secondary | ICD-10-CM | POA: Insufficient documentation

## 2013-01-23 DIAGNOSIS — K8020 Calculus of gallbladder without cholecystitis without obstruction: Secondary | ICD-10-CM | POA: Insufficient documentation

## 2013-01-23 DIAGNOSIS — N201 Calculus of ureter: Secondary | ICD-10-CM | POA: Diagnosis not present

## 2013-01-27 ENCOUNTER — Other Ambulatory Visit: Payer: Self-pay | Admitting: Family Medicine

## 2013-01-27 DIAGNOSIS — Z139 Encounter for screening, unspecified: Secondary | ICD-10-CM

## 2013-02-03 ENCOUNTER — Ambulatory Visit (HOSPITAL_COMMUNITY)
Admission: RE | Admit: 2013-02-03 | Discharge: 2013-02-03 | Disposition: A | Discharge status: Home / Self Care | Payer: Medicare Other | Source: Ambulatory Visit | Attending: Family Medicine | Admitting: Family Medicine

## 2013-02-03 DIAGNOSIS — Z1231 Encounter for screening mammogram for malignant neoplasm of breast: Secondary | ICD-10-CM | POA: Insufficient documentation

## 2013-02-03 DIAGNOSIS — Z139 Encounter for screening, unspecified: Secondary | ICD-10-CM

## 2013-02-03 DIAGNOSIS — E119 Type 2 diabetes mellitus without complications: Secondary | ICD-10-CM | POA: Diagnosis not present

## 2013-02-03 DIAGNOSIS — E669 Obesity, unspecified: Secondary | ICD-10-CM | POA: Diagnosis not present

## 2013-02-03 DIAGNOSIS — E785 Hyperlipidemia, unspecified: Secondary | ICD-10-CM | POA: Diagnosis not present

## 2013-02-04 ENCOUNTER — Other Ambulatory Visit: Payer: Self-pay | Admitting: Family Medicine

## 2013-02-04 LAB — COMPLETE METABOLIC PANEL WITH GFR
ALT: 13 U/L (ref 0–35)
AST: 15 U/L (ref 0–37)
Albumin: 4.3 g/dL (ref 3.5–5.2)
Alkaline Phosphatase: 64 U/L (ref 39–117)
BUN: 14 mg/dL (ref 6–23)
CO2: 29 mEq/L (ref 19–32)
Calcium: 10.3 mg/dL (ref 8.4–10.5)
Chloride: 100 mEq/L (ref 96–112)
Creat: 0.91 mg/dL (ref 0.50–1.10)
GFR, Est African American: 75 mL/min
GFR, Est Non African American: 65 mL/min
Glucose, Bld: 89 mg/dL (ref 70–99)
Potassium: 5 mEq/L (ref 3.5–5.3)
Sodium: 139 mEq/L (ref 135–145)
Total Bilirubin: 0.3 mg/dL (ref 0.3–1.2)
Total Protein: 6.8 g/dL (ref 6.0–8.3)

## 2013-02-04 LAB — LIPID PANEL
Cholesterol: 188 mg/dL (ref 0–200)
HDL: 56 mg/dL (ref >39)
LDL Cholesterol: 91 mg/dL (ref 0–99)
Total CHOL/HDL Ratio: 3.4 Ratio
Triglycerides: 204 mg/dL — ABNORMAL HIGH (ref <150)
VLDL: 41 mg/dL — ABNORMAL HIGH (ref 0–40)

## 2013-02-04 LAB — MICROALBUMIN / CREATININE URINE RATIO
Creatinine, Urine: 170.7 mg/dL
Microalb Creat Ratio: 23.7 mg/g (ref 0.0–30.0)
Microalb, Ur: 4.04 mg/dL — ABNORMAL HIGH (ref 0.00–1.89)

## 2013-02-04 LAB — HEMOGLOBIN A1C
Hgb A1c MFr Bld: 6.2 % — ABNORMAL HIGH (ref <5.7)
Mean Plasma Glucose: 131 mg/dL — ABNORMAL HIGH (ref <117)

## 2013-02-10 ENCOUNTER — Encounter: Payer: Self-pay | Admitting: Family Medicine

## 2013-02-10 ENCOUNTER — Telehealth: Payer: Self-pay | Admitting: Family Medicine

## 2013-02-10 ENCOUNTER — Ambulatory Visit (INDEPENDENT_AMBULATORY_CARE_PROVIDER_SITE_OTHER): Payer: Medicare Other | Admitting: Family Medicine

## 2013-02-10 ENCOUNTER — Encounter (INDEPENDENT_AMBULATORY_CARE_PROVIDER_SITE_OTHER): Payer: Self-pay

## 2013-02-10 VITALS — BP 158/82 | HR 92 | Resp 18 | Ht 65.0 in | Wt 164.1 lb

## 2013-02-10 DIAGNOSIS — F4323 Adjustment disorder with mixed anxiety and depressed mood: Secondary | ICD-10-CM

## 2013-02-10 DIAGNOSIS — I10 Essential (primary) hypertension: Secondary | ICD-10-CM

## 2013-02-10 DIAGNOSIS — R5381 Other malaise: Secondary | ICD-10-CM

## 2013-02-10 DIAGNOSIS — E119 Type 2 diabetes mellitus without complications: Secondary | ICD-10-CM | POA: Diagnosis not present

## 2013-02-10 DIAGNOSIS — R5383 Other fatigue: Secondary | ICD-10-CM

## 2013-02-10 DIAGNOSIS — E669 Obesity, unspecified: Secondary | ICD-10-CM

## 2013-02-10 DIAGNOSIS — F17200 Nicotine dependence, unspecified, uncomplicated: Secondary | ICD-10-CM

## 2013-02-10 DIAGNOSIS — J42 Unspecified chronic bronchitis: Secondary | ICD-10-CM | POA: Diagnosis not present

## 2013-02-10 DIAGNOSIS — E1169 Type 2 diabetes mellitus with other specified complication: Secondary | ICD-10-CM

## 2013-02-10 DIAGNOSIS — R918 Other nonspecific abnormal finding of lung field: Secondary | ICD-10-CM

## 2013-02-10 DIAGNOSIS — J449 Chronic obstructive pulmonary disease, unspecified: Secondary | ICD-10-CM

## 2013-02-10 MED ORDER — FLUOXETINE HCL (PMDD) 10 MG PO TABS: 1.0000 | ORAL_TABLET | Freq: Every day | ORAL | Status: DC

## 2013-02-10 MED ORDER — BENAZEPRIL HCL 40 MG PO TABS: 40.0000 mg | ORAL_TABLET | Freq: Every day | ORAL | Status: DC

## 2013-02-10 MED ORDER — AZITHROMYCIN 250 MG PO TABS: ORAL_TABLET | ORAL | Status: DC

## 2013-02-10 NOTE — Patient Instructions (Addendum)
F/u with foot exam in 4 month, call if you need me before  Blood sugar is excellent.   Blood pressure is too high so I will increase the dose of your medication to 40mg  once daily, OK to take TWO 20mg  tablets once daily till done, but please make sure new tablets you collect are 40mg  dose  Z pack prescribed.  Start fluoxetine one daily for anxiety this is new and is sent in  Continue to reduce by 1 cigarette each month or every 2 weeks, quitting will improve your health  I will order your follow up chest scan and refer you for f/u with Dr Gwenette Greet re lung nodules  You are referred for eye exam this is due  HBA1c, chem 7 and TSH in 4 month before visit

## 2013-02-11 NOTE — Telephone Encounter (Signed)
Patient aware that insurance will not cover extra strips

## 2013-02-23 ENCOUNTER — Other Ambulatory Visit: Payer: Self-pay | Admitting: Family Medicine

## 2013-03-07 ENCOUNTER — Other Ambulatory Visit: Payer: Self-pay | Admitting: Family Medicine

## 2013-03-08 NOTE — Progress Notes (Signed)
Subjective:    Patient ID: [name], female    DOB: 01/02/1946, 68 y.o.   MRN: [id number]  HPI The PT is here for follow up and re-evaluation of chronic medical conditions, medication management and review of any available recent lab and radiology data.  Preventive health is updated, specifically  Cancer screening and Immunization.   Needs eye exam, and re eval of lung nodules in Marchs or concerns regarding consultations or procedures which the PT has had in the interim are  addressed. The PT denies any adverse reactions to current medications since the last visit.  C/o uncontrolled anxiety wishes medication for this. Increased cough and chest congestion x 1 week    Review of Systems See HPI Denies recent fever  Denies sinus pressure, nasal congestion, ear pain or sore throat. C/o increased cough and sputum production which is thick and cream with wheezes in the past week, also intermittent chills, but no documented fever Denies chest pains, palpitations and leg swelling Denies abdominal pain, nausea, vomiting,diarrhea or constipation.   Denies dysuria, frequency, hesitancy or incontinence. Chronic joint pain , managed by orthopedics Denies headaches, seizures, numbness Denies depression but c/o uncontrolled anxiety Denies skin break down or rash.        Objective:   Physical Exam BP 158/82  Pulse 92  Resp 18  Ht 5\' 5"  (1.651 m)  Wt 164 lb 1.3 oz (74.426 kg)  BMI 27.30 kg/m2  SpO2 95% Patient alert and oriented and in no cardiopulmonary distress.  HEENT: No facial asymmetry, EOMI, no sinus tenderness,  oropharynx pink and moist.  Neck supple no adenopathy.  Chest: decreased air entry , scattered crackles and few wheezes.  CVS: S1, S2 no murmurs, no S3.  ABD: Soft non tender. Bowel sounds normal.  Ext: No edema  MS: Adequate ROM spine, shoulders, hips and knees.  Skin: Intact, no ulcerations or rash noted.  Psych: good eye contact,  Normal affect,  anxious but not depressed appearing  CNS: CN 2-12 intact, power, tone and sensation normal throughout.        Assessment & Plan:  Pulmonary nodules Chest scan without contrast already ordered by pulmonary service , due in march, pt will f/u with Dr  Gwenette Greet after this, referrla will be entered foir that visit  NICOTINE ADDICTION Gradually cutting back, but unwilling to set a quit date at this time. Patient counseled for approximately 5 minutes regarding the health risks of ongoing nicotine use, specifically all types of cancer, heart disease, stroke and respiratory failure. The options available for help with cessation ,the behavioral changes to assist the process, and the option to either gradully reduce usage  Or abruptly stop.is also discussed. Pt is also encouraged to set specific goals in number of cigarettes used daily, as well as to set a quit date.   Diabetes mellitus type 2 in obese Controlled, no change in medication Patient advised to reduce carb and sweets, commit to regular physical activity, take meds as prescribed, test blood as directed, and attempt to lose weight, to improve blood sugar control.   COPD Worsening due to continued nicotine use  HYPERTENSION Uncontrolled, dose inc in medication DASH diet and commitment to daily physical activity for a minimum of 30 minutes discussed and encouraged, as a part of hypertension management. The importance of attaining a healthy weight is also discussed.   OTHER CHRONIC BRONCHITIS Increased sputum production and cough x 1 week, z pack prescribed  ADJ DISORDER WITH MIXED ANXIETY &  DEPRESSED MOOD Reports increased anxiety, start fluoxetine daily

## 2013-03-08 NOTE — Assessment & Plan Note (Signed)
Increased sputum production and cough x 1 week, z pack prescribed

## 2013-03-08 NOTE — Progress Notes (Signed)
-  Rescheduled due to inclement weather-  [name]   

## 2013-03-08 NOTE — Assessment & Plan Note (Signed)
Worsening due to continued nicotine use

## 2013-03-08 NOTE — Assessment & Plan Note (Signed)
Controlled, no change in medication Patient advised to reduce carb and sweets, commit to regular physical activity, take meds as prescribed, test blood as directed, and attempt to lose weight, to improve blood sugar control.  

## 2013-03-08 NOTE — Assessment & Plan Note (Addendum)
Gradually cutting back, but unwilling to set a quit date at this time. Patient counseled for approximately 5 minutes regarding the health risks of ongoing nicotine use, specifically all types of cancer, heart disease, stroke and respiratory failure. The options available for help with cessation ,the behavioral changes to assist the process, and the option to either gradully reduce usage  Or abruptly stop.is also discussed. Pt is also encouraged to set specific goals in number of cigarettes used daily, as well as to set a quit date.

## 2013-03-08 NOTE — Assessment & Plan Note (Signed)
Reports increased anxiety, start fluoxetine daily

## 2013-03-08 NOTE — Assessment & Plan Note (Signed)
Chest scan without contrast already ordered by pulmonary service , due in march, pt will f/u with Dr  Gwenette Greet after this, referrla will be entered foir that visit

## 2013-03-08 NOTE — Assessment & Plan Note (Signed)
Uncontrolled, dose inc in medication DASH diet and commitment to daily physical activity for a minimum of 30 minutes discussed and encouraged, as a part of hypertension management. The importance of attaining a healthy weight is also discussed.  

## 2013-03-09 ENCOUNTER — Ambulatory Visit (HOSPITAL_COMMUNITY): Payer: Medicare Other | Admitting: Oncology

## 2013-03-09 ENCOUNTER — Other Ambulatory Visit: Payer: Self-pay | Admitting: Family Medicine

## 2013-03-30 ENCOUNTER — Telehealth: Payer: Self-pay | Admitting: Pulmonary Disease

## 2013-03-30 ENCOUNTER — Telehealth: Payer: Self-pay | Admitting: Family Medicine

## 2013-03-30 NOTE — Telephone Encounter (Signed)
Yes this is fine.

## 2013-03-30 NOTE — Telephone Encounter (Signed)
Luann from [facility] is calling about pt's 1 year CT scan. She is wanting to know if we will be the one's to have this scheduled. Advised her that per Ashtyn's documentation on the last CT, that we will be the one's to order this. A reminder was place to have this scheduled.

## 2013-03-31 ENCOUNTER — Other Ambulatory Visit: Payer: Self-pay | Admitting: Family Medicine

## 2013-04-02 NOTE — Progress Notes (Signed)
[name], MD [address], [address] [place] [address]  Breast cancer  CURRENT THERAPY: Surveillance per NCCN guidelines  INTERVAL HISTORY: [name] 68 y.o. female returns for  regular  visit for followup of Stage I (T1b N0 M0), grade 1 well-differentiated carcinoma of the left breast status post lumpectomy followed by radiation therapy. Her estrogen receptor receptors were 93%, progesterone receptors 67%. HER-2/neu was negative. No lymphovascular invasion was seen. All margins were clear. Ki-67 marker was low at 1% with surgery on 11/15/2004. Treated then with post-lumpectomy radiation, finished as of 02/22/2005, followed by Aromasin beginning on 12/11/2004, which she could not tolerate. We then switched her to Femara, which she finished all therapy as of January 14, 2010.    Breast cancer   11/15/2004 Surgery Lumpectomy    - 02/22/2005 Radiation Therapy    12/11/2004 -  Chemotherapy Aromasin    Adverse Reaction Intolerance to Aromasin    - 01/14/2010 Chemotherapy Femara    I personally reviewed and went over laboratory results with the patient.  The results are noted within this dictation.  NCCN guidelines recommends the following surveillance for invasive breast cancer:  A. History and Physical exam every 4-6 months for 5 years and then every 12 months.  B. Mammography every 12 months  C. Women on Tamoxifen: annual gynecologic assessment every 12 months if uterus is present.  D. Women on aromatase inhibitor or who experience ovarian failure secondary to treatment should have monitoring of bone health with a bone mineral density determination at baseline and periodically thereafter.  E. Assess and encourage adherence to adjuvant endocrine therapy.  F. Evidence suggests that active lifestyle and achieving and maintaining an ideal body weight (20-25 BMI) may lead to optimal breast cancer outcomes.  I personally reviewed and went over radiographic studies with the  patient.  The results are noted within this dictation.     Mammogram from 02/06/2013 was BIRADS 1 and she is reminded that she will be due in Jan 2016 for a follow-up mammogram.  She does have dry skin of the arms and legs.  I provided her education regarding dry skin and recommended moisturizing lotion, particularly after bathing.  Oncologically, she denies any complaints and ROS questioning is negative.  Past Medical History  Diagnosis Date  . Hypertension   . Breast cancer, left breast   . Nicotine dependence   . COPD (chronic obstructive pulmonary disease)   . Osteoarthritis   . Kidney stones   . Diverticula of colon   . Hyperlipidemia   . Breast cancer 03/11/2012    Stage I (T1b N0 M0), grade 1 well-differentiated carcinoma of the left breast status, post lumpectomy followed by radiation therapy. Her estrogen receptor receptors were 93%, progesterone receptors 67%. HER-2/neu was negative. No lymphovascular space invasion was seen. All margins were clear. Ki-67 marker was low at 1% with surgery on 11/15/2004. Treated then with post-lumpectomy radiation, finish  . Diabetes mellitus without complication     has HYPERLIPIDEMIA; NICOTINE ADDICTION; ADJ DISORDER WITH MIXED ANXIETY & DEPRESSED MOOD; HYPERTENSION; OTHER CHRONIC BRONCHITIS; COPD; DYSPEPSIA; CONSTIPATION, CHRONIC; GENERALIZED OSTEOARTHROSIS UNSPECIFIED SITE; FATIGUE; Ganglion cyst; Diabetes mellitus type 2 in obese; Breast cancer; Pulmonary nodules; and Routine general medical examination at a health care facility on her problem list.     is allergic to penicillins and sulfonamide derivatives.  Ms. Sterry had no medications administered during this visit.  Past Surgical History  Procedure Laterality Date  . Abdominal hysterectomy    .  Tubal ligation    . Left breast       cancer, in 2006  . Colectomy      2005, diverticulitis  . Breast surgery  2005 approx    left lumpectomy   . Dilation and curettage of uterus       Denies any headaches, dizziness, double vision, fevers, chills, night sweats, nausea, vomiting, diarrhea, constipation, chest pain, heart palpitations, shortness of breath, blood in stool, black tarry stool, urinary pain, urinary burning, urinary frequency, hematuria.   PHYSICAL EXAMINATION  ECOG PERFORMANCE STATUS: 1 - Symptomatic but completely ambulatory  Filed Vitals:   04/03/13 1400  BP: 129/62  Pulse: 78  Temp: 98.2 F (36.8 C)  Resp: 18    GENERAL:alert, no distress, well nourished, well developed, comfortable, cooperative and smiling SKIN: skin color, texture, turgor are normal, no rashes or significant lesions, dry skin on legs and arms HEAD: Normocephalic, No masses, lesions, tenderness or abnormalities EYES: normal, PERRLA, EOMI, Conjunctiva are pink and non-injected EARS: External ears normal OROPHARYNX:mucous membranes are moist  NECK: supple, no adenopathy, thyroid normal size, non-tender, without nodularity, no stridor, non-tender, trachea midline LYMPH:  no palpable lymphadenopathy, no hepatosplenomegaly BREAST:breasts appear normal, no suspicious masses, no skin or nipple changes or axillary nodes LUNGS: clear to auscultation and percussion, decreased breath sounds throughout HEART: regular rate & rhythm, no murmurs and no gallops ABDOMEN:abdomen soft, non-tender, obese, normal bowel sounds, no masses or organomegaly and no hepatosplenomegaly BACK: Back symmetric, no curvature., No CVA tenderness EXTREMITIES:less then 2 second capillary refill, no joint deformities, effusion, or inflammation, no edema, no skin discoloration, no clubbing, no cyanosis  NEURO: alert & oriented x 3 with fluent speech, no focal motor/sensory deficits, gait normal   LABORATORY DATA: CBC    Component Value Date/Time   WBC 13.4* 11/25/2012 1810   RBC 5.15* 11/25/2012 1810   HGB 15.3* 11/25/2012 1810   HCT 46.2* 11/25/2012 1810   PLT 362 11/25/2012 1810   MCV 89.7 11/25/2012  1810   MCH 29.7 11/25/2012 1810   MCHC 33.1 11/25/2012 1810   RDW 13.6 11/25/2012 1810   LYMPHSABS 3.6 11/25/2012 1810   MONOABS 1.3* 11/25/2012 1810   EOSABS 0.0 11/25/2012 1810   BASOSABS 0.1 11/25/2012 1810      Chemistry      Component Value Date/Time   NA 139 02/03/2013 1427   K 5.0 02/03/2013 1427   CL 100 02/03/2013 1427   CO2 29 02/03/2013 1427   BUN 14 02/03/2013 1427   CREATININE 0.91 02/03/2013 1427   CREATININE 0.83 11/25/2012 1810      Component Value Date/Time   CALCIUM 10.3 02/03/2013 1427   ALKPHOS 64 02/03/2013 1427   AST 15 02/03/2013 1427   ALT 13 02/03/2013 1427   BILITOT 0.3 02/03/2013 1427       RADIOGRAPHIC STUDIES:  02/06/2013  CLINICAL DATA: Screening. History of left lumpectomy in 2007.  EXAM:  DIGITAL SCREENING BILATERAL MAMMOGRAM WITH CAD  COMPARISON: Previous exam(s).  ACR Breast Density Category b: There are scattered areas of  fibroglandular density.  FINDINGS:  There are no findings suspicious for malignancy. Postoperative  changes are seen in the left breast. Images were processed with CAD.  IMPRESSION:  No mammographic evidence of malignancy. A result letter of this  screening mammogram will be mailed directly to the patient.  RECOMMENDATION:  Screening mammogram in one year. (Code:SM-B-01Y)  BI-RADS CATEGORY 1: Negative.  Electronically Signed  By: Shon Hale M.D.  On: 02/06/2013 11:31  ASSESSMENT:  1.  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 Her estrogen receptor receptors were 93%, progesterone receptors 67%. HER-2/neu was negative. No lymphovascular invasion was seen. All margins were clear. Ki-67 marker was low at 1% with surgery on 11/15/2004. Treated then with post-lumpectomy radiation, finished as of 02/22/2005, followed by Aromasin beginning on 12/11/2004, which she could not tolerate. We then switched her to Femara, which she finished  all therapy as of January 14, 2010.   Patient Active Problem List   Diagnosis Date Noted  . Routine general medical examination at a health care facility 09/07/2012  . Pulmonary nodules 04/04/2012  . Breast cancer 03/11/2012  . Diabetes mellitus type 2 in obese 09/13/2011  . Ganglion cyst 05/08/2011  . COPD 07/24/2009  . HYPERLIPIDEMIA 03/09/2009  . CONSTIPATION, CHRONIC 03/09/2009  . DYSPEPSIA 11/18/2008  . OTHER CHRONIC BRONCHITIS 10/26/2008  . FATIGUE 10/26/2008  . NICOTINE ADDICTION 10/10/2008  . ADJ DISORDER WITH MIXED ANXIETY & DEPRESSED MOOD 09/29/2008  . HYPERTENSION 09/29/2008  . GENERALIZED OSTEOARTHROSIS UNSPECIFIED SITE 09/29/2008    PLAN:  1. I personally reviewed and went over laboratory results with the patient.  The results are noted within this dictation. 2. I personally reviewed and went over radiographic studies with the patient.  The results are noted within this dictation.   3. Next screening mammogram is due in Jan 2016. 4. Defer pulmonary nodule follow-up to Dr. Gerald Leitz 5. No role for blood work from an oncology standpoint. 6. Recommend follow-up with PCP as scheduled 7. She has the option to be released from our clinic with annual mammogram and follow-up with PCP.  She initially declined release, but at the end of our visit, she may reconsider.   8. I recommended moisturizing lotion to skin as she has dry skin.  9. Return in 1 year for follow-up   THERAPY PLAN:  She is 9 years out from the diagnosis of her breast cancer.  She has completed all therapy in 2011.  She is a candidate for release from the [facility] at any time with annual mammogram and follow-up appointment with her PCP.  She will consider this option, but she is inclined to return annually, which is certainly reasonable.  We will continue to follow NCCN guidelines pertaining to surveillance.  NCCN guidelines recommends the following surveillance for invasive breast cancer:  A.  History and Physical exam every 4-6 months for 5 years and then every 12 months.  B. Mammography every 12 months  C. Women on Tamoxifen: annual gynecologic assessment every 12 months if uterus is present.  D. Women on aromatase inhibitor or who experience ovarian failure secondary to treatment should have monitoring of bone health with a bone mineral density determination at baseline and periodically thereafter.  E. Assess and encourage adherence to adjuvant endocrine therapy.  F. Evidence suggests that active lifestyle and achieving and maintaining an ideal body weight (20-25 BMI) may lead to optimal breast cancer outcomes.   All questions were answered. The patient knows to call the clinic with any problems, questions or concerns. We can certainly see the patient much sooner if necessary.  Patient and plan discussed with Dr. Farrel Gobble and he is in agreement with the aforementioned.   , 04/03/2013

## 2013-04-03 ENCOUNTER — Encounter (HOSPITAL_COMMUNITY): Payer: Medicare Other | Attending: Oncology | Admitting: Oncology

## 2013-04-03 ENCOUNTER — Encounter (HOSPITAL_COMMUNITY): Payer: Self-pay | Admitting: Oncology

## 2013-04-03 VITALS — BP 129/62 | HR 78 | Temp 98.2°F | Resp 18 | Wt 159.7 lb

## 2013-04-03 DIAGNOSIS — C50919 Malignant neoplasm of unspecified site of unspecified female breast: Secondary | ICD-10-CM

## 2013-04-03 DIAGNOSIS — Z853 Personal history of malignant neoplasm of breast: Secondary | ICD-10-CM | POA: Diagnosis not present

## 2013-04-03 NOTE — Patient Instructions (Signed)
[name] Discharge Instructions  RECOMMENDATIONS MADE BY THE CONSULTANT AND ANY TEST RESULTS WILL BE SENT TO YOUR REFERRING PHYSICIAN.  We will see you in 1 year.  Thank you for choosing [facility] to provide your oncology and hematology care.  To afford each patient quality time with our providers, please arrive at least 15 minutes before your scheduled appointment time.  With your help, our goal is to use those 15 minutes to complete the necessary work-up to ensure our physicians have the information they need to help with your evaluation and healthcare recommendations.    Effective January 1st, 2014, we ask that you re-schedule your appointment with our physicians should you arrive 10 or more minutes late for your appointment.  We strive to give you quality time with our providers, and arriving late affects you and other patients whose appointments are after yours.    Again, thank you for choosing [facility].  Our hope is that these requests will decrease the amount of time that you wait before being seen by our physicians.       _____________________________________________________________  Should you have questions after your visit to [facility], please contact our office at [phone] between the hours of 8:30 a.m. and 5:00 p.m.  Voicemails left after 4:30 p.m. will not be returned until the following business day.  For prescription refill requests, have your pharmacy contact our office with your prescription refill request.

## 2013-04-06 ENCOUNTER — Encounter: Payer: Self-pay | Admitting: Obstetrics and Gynecology

## 2013-04-06 ENCOUNTER — Ambulatory Visit (INDEPENDENT_AMBULATORY_CARE_PROVIDER_SITE_OTHER): Payer: Medicare Other | Admitting: Obstetrics and Gynecology

## 2013-04-06 VITALS — BP 144/84 | Ht 65.0 in | Wt 161.0 lb

## 2013-04-06 DIAGNOSIS — D3910 Neoplasm of uncertain behavior of unspecified ovary: Secondary | ICD-10-CM | POA: Diagnosis not present

## 2013-04-06 DIAGNOSIS — N83209 Unspecified ovarian cyst, unspecified side: Secondary | ICD-10-CM

## 2013-04-06 NOTE — Patient Instructions (Signed)
Follow up in 6 months 

## 2013-04-07 LAB — CA 125: CA 125: 3.1 U/mL (ref 0.0–30.2)

## 2013-04-11 NOTE — Progress Notes (Signed)
   [facility] Visit  Patient name: [name] MRN [id number]  Date of birth: 29-May-1945  CC & HPI:  [name] is a 68 y.o. female presenting today for followup of suspected postmenopausal adnexal cyst, with initial impression of hydrosalpinx, stable, with initial Ca125 normal at 6. Here for recheck of Ca 125.  ROS:  assymptomatic  Pertinent History Reviewed:  Medical & Surgical Hx:  Reviewed: Significant for vag hyst. Has tubes and ovaries. Medications: Reviewed & Updated - see associated section Social History: Reviewed -  reports that she has been smoking Cigarettes.  She has a 25 pack-year smoking history. She uses smokeless tobacco.  Objective Findings:  Vitals: BP 144/84  Ht 5\' 5"  (1.651 m)  Wt 161 lb (73.029 kg)  BMI 26.79 kg/m2  Physical Examination: alert oriented assymptomatic   Assessment & Plan:   Postmenopausal adnexal cyst , likely benign Possible stable hydrosalpinx P: recheck Ca125, if normal ,check in 6 months.    Results Ca125 is normal at 3.1.

## 2013-04-14 DIAGNOSIS — M25519 Pain in unspecified shoulder: Secondary | ICD-10-CM | POA: Diagnosis not present

## 2013-04-14 DIAGNOSIS — M79609 Pain in unspecified limb: Secondary | ICD-10-CM | POA: Diagnosis not present

## 2013-04-14 DIAGNOSIS — I10 Essential (primary) hypertension: Secondary | ICD-10-CM | POA: Diagnosis not present

## 2013-04-21 DIAGNOSIS — E119 Type 2 diabetes mellitus without complications: Secondary | ICD-10-CM | POA: Diagnosis not present

## 2013-04-21 DIAGNOSIS — H2510 Age-related nuclear cataract, unspecified eye: Secondary | ICD-10-CM | POA: Diagnosis not present

## 2013-04-21 DIAGNOSIS — H26019 Infantile and juvenile cortical, lamellar, or zonular cataract, unspecified eye: Secondary | ICD-10-CM | POA: Diagnosis not present

## 2013-04-21 LAB — HM DIABETES EYE EXAM

## 2013-04-28 ENCOUNTER — Other Ambulatory Visit: Payer: Self-pay | Admitting: Family Medicine

## 2013-05-26 DIAGNOSIS — I10 Essential (primary) hypertension: Secondary | ICD-10-CM | POA: Diagnosis not present

## 2013-05-26 DIAGNOSIS — M25579 Pain in unspecified ankle and joints of unspecified foot: Secondary | ICD-10-CM | POA: Diagnosis not present

## 2013-05-26 DIAGNOSIS — M25519 Pain in unspecified shoulder: Secondary | ICD-10-CM | POA: Diagnosis not present

## 2013-05-27 ENCOUNTER — Other Ambulatory Visit: Payer: Self-pay | Admitting: Family Medicine

## 2013-06-10 ENCOUNTER — Ambulatory Visit (INDEPENDENT_AMBULATORY_CARE_PROVIDER_SITE_OTHER): Payer: Medicare Other | Admitting: Family Medicine

## 2013-06-10 ENCOUNTER — Other Ambulatory Visit: Payer: Self-pay | Admitting: Family Medicine

## 2013-06-10 ENCOUNTER — Encounter: Payer: Self-pay | Admitting: Family Medicine

## 2013-06-10 VITALS — BP 160/80 | HR 83 | Resp 16 | Wt 159.1 lb

## 2013-06-10 DIAGNOSIS — I10 Essential (primary) hypertension: Secondary | ICD-10-CM | POA: Diagnosis not present

## 2013-06-10 DIAGNOSIS — E785 Hyperlipidemia, unspecified: Secondary | ICD-10-CM

## 2013-06-10 DIAGNOSIS — E1169 Type 2 diabetes mellitus with other specified complication: Secondary | ICD-10-CM

## 2013-06-10 DIAGNOSIS — M8580 Other specified disorders of bone density and structure, unspecified site: Secondary | ICD-10-CM

## 2013-06-10 DIAGNOSIS — E669 Obesity, unspecified: Secondary | ICD-10-CM

## 2013-06-10 DIAGNOSIS — M949 Disorder of cartilage, unspecified: Secondary | ICD-10-CM | POA: Diagnosis not present

## 2013-06-10 DIAGNOSIS — M899 Disorder of bone, unspecified: Secondary | ICD-10-CM | POA: Diagnosis not present

## 2013-06-10 DIAGNOSIS — E119 Type 2 diabetes mellitus without complications: Secondary | ICD-10-CM | POA: Diagnosis not present

## 2013-06-10 DIAGNOSIS — F17200 Nicotine dependence, unspecified, uncomplicated: Secondary | ICD-10-CM

## 2013-06-10 MED ORDER — AMLODIPINE BESYLATE 2.5 MG PO TABS: 2.5000 mg | ORAL_TABLET | Freq: Every day | ORAL | Status: DC

## 2013-06-10 NOTE — Patient Instructions (Addendum)
Annual physical exam 3 month, call if you need me before  You are referred for bone density test  Blood pressure is too high, new additional medication is amlodipine, continue benazepril as before  Lab today hBa1C cmp and EGFr  Fasting lipid, cmp and EGFR and hBa1C in 3 month  Please work on smoking cessation, you need to quit  Ear exam is normal, no sign of infection

## 2013-06-11 LAB — COMPLETE METABOLIC PANEL WITH GFR
ALT: 11 U/L (ref 0–35)
AST: 14 U/L (ref 0–37)
Albumin: 4.3 g/dL (ref 3.5–5.2)
Alkaline Phosphatase: 62 U/L (ref 39–117)
BUN: 14 mg/dL (ref 6–23)
CALCIUM: 9.3 mg/dL (ref 8.4–10.5)
CHLORIDE: 103 meq/L (ref 96–112)
CO2: 27 mEq/L (ref 19–32)
Creat: 0.73 mg/dL (ref 0.50–1.10)
GFR, EST NON AFRICAN AMERICAN: 85 mL/min
Glucose, Bld: 70 mg/dL (ref 70–99)
POTASSIUM: 4.8 meq/L (ref 3.5–5.3)
Sodium: 138 mEq/L (ref 135–145)
Total Bilirubin: 0.4 mg/dL (ref 0.2–1.2)
Total Protein: 6.8 g/dL (ref 6.0–8.3)

## 2013-06-11 LAB — HEMOGLOBIN A1C
Hgb A1c MFr Bld: 5.9 % — ABNORMAL HIGH (ref <5.7)
Mean Plasma Glucose: 123 mg/dL — ABNORMAL HIGH (ref <117)

## 2013-06-11 MED ORDER — LORATADINE 10 MG PO TABS
ORAL_TABLET | ORAL | Status: DC
Start: 2013-06-11 — End: 2013-10-22

## 2013-06-11 MED ORDER — METFORMIN HCL 500 MG PO TABS: ORAL_TABLET | ORAL | Status: DC

## 2013-06-23 ENCOUNTER — Telehealth: Payer: Self-pay | Admitting: Family Medicine

## 2013-06-23 NOTE — Telephone Encounter (Signed)
Patient aware that her ears and throat looked fine at visit and no abx needed. Advised to gargle with warm salt water x a couple days and to call back for OV if it gets worse

## 2013-07-07 DIAGNOSIS — M25579 Pain in unspecified ankle and joints of unspecified foot: Secondary | ICD-10-CM | POA: Diagnosis not present

## 2013-07-07 DIAGNOSIS — M25519 Pain in unspecified shoulder: Secondary | ICD-10-CM | POA: Diagnosis not present

## 2013-07-07 DIAGNOSIS — I10 Essential (primary) hypertension: Secondary | ICD-10-CM | POA: Diagnosis not present

## 2013-07-13 ENCOUNTER — Ambulatory Visit (HOSPITAL_COMMUNITY)
Admission: RE | Admit: 2013-07-13 | Discharge: 2013-07-13 | Disposition: A | Discharge status: Home / Self Care | Payer: Medicare Other | Source: Ambulatory Visit | Attending: Family Medicine | Admitting: Family Medicine

## 2013-07-13 DIAGNOSIS — M949 Disorder of cartilage, unspecified: Secondary | ICD-10-CM | POA: Diagnosis not present

## 2013-07-13 DIAGNOSIS — M899 Disorder of bone, unspecified: Secondary | ICD-10-CM | POA: Diagnosis not present

## 2013-07-13 DIAGNOSIS — M8580 Other specified disorders of bone density and structure, unspecified site: Secondary | ICD-10-CM

## 2013-07-13 DIAGNOSIS — Z780 Asymptomatic menopausal state: Secondary | ICD-10-CM | POA: Diagnosis not present

## 2013-07-15 ENCOUNTER — Encounter: Payer: Self-pay | Admitting: Family Medicine

## 2013-07-15 DIAGNOSIS — M8580 Other specified disorders of bone density and structure, unspecified site: Secondary | ICD-10-CM | POA: Insufficient documentation

## 2013-07-29 ENCOUNTER — Other Ambulatory Visit: Payer: Self-pay | Admitting: Family Medicine

## 2013-07-30 DIAGNOSIS — R5381 Other malaise: Secondary | ICD-10-CM | POA: Diagnosis not present

## 2013-07-30 DIAGNOSIS — I10 Essential (primary) hypertension: Secondary | ICD-10-CM | POA: Diagnosis not present

## 2013-07-30 DIAGNOSIS — E669 Obesity, unspecified: Secondary | ICD-10-CM | POA: Diagnosis not present

## 2013-07-30 DIAGNOSIS — E119 Type 2 diabetes mellitus without complications: Secondary | ICD-10-CM | POA: Diagnosis not present

## 2013-07-31 LAB — BASIC METABOLIC PANEL
BUN: 16 mg/dL (ref 6–23)
CHLORIDE: 103 meq/L (ref 96–112)
CO2: 28 meq/L (ref 19–32)
CREATININE: 0.86 mg/dL (ref 0.50–1.10)
Calcium: 10.6 mg/dL — ABNORMAL HIGH (ref 8.4–10.5)
Glucose, Bld: 94 mg/dL (ref 70–99)
Potassium: 5.3 mEq/L (ref 3.5–5.3)
Sodium: 141 mEq/L (ref 135–145)

## 2013-07-31 LAB — HEMOGLOBIN A1C
Hgb A1c MFr Bld: 5.8 % — ABNORMAL HIGH (ref <5.7)
Mean Plasma Glucose: 120 mg/dL — ABNORMAL HIGH (ref <117)

## 2013-07-31 LAB — TSH: TSH: 0.568 u[IU]/mL (ref 0.350–4.500)

## 2013-08-03 ENCOUNTER — Other Ambulatory Visit: Payer: Self-pay

## 2013-08-03 ENCOUNTER — Encounter (INDEPENDENT_AMBULATORY_CARE_PROVIDER_SITE_OTHER): Payer: Self-pay

## 2013-08-03 ENCOUNTER — Ambulatory Visit (INDEPENDENT_AMBULATORY_CARE_PROVIDER_SITE_OTHER): Payer: Medicare Other | Admitting: Family Medicine

## 2013-08-03 ENCOUNTER — Encounter: Payer: Self-pay | Admitting: Family Medicine

## 2013-08-03 VITALS — BP 152/80 | HR 94 | Resp 18 | Ht 65.0 in | Wt 152.0 lb

## 2013-08-03 DIAGNOSIS — E119 Type 2 diabetes mellitus without complications: Secondary | ICD-10-CM

## 2013-08-03 DIAGNOSIS — J42 Unspecified chronic bronchitis: Secondary | ICD-10-CM

## 2013-08-03 DIAGNOSIS — F17200 Nicotine dependence, unspecified, uncomplicated: Secondary | ICD-10-CM

## 2013-08-03 DIAGNOSIS — E1169 Type 2 diabetes mellitus with other specified complication: Secondary | ICD-10-CM

## 2013-08-03 DIAGNOSIS — F4323 Adjustment disorder with mixed anxiety and depressed mood: Secondary | ICD-10-CM | POA: Diagnosis not present

## 2013-08-03 DIAGNOSIS — I10 Essential (primary) hypertension: Secondary | ICD-10-CM

## 2013-08-03 DIAGNOSIS — E785 Hyperlipidemia, unspecified: Secondary | ICD-10-CM

## 2013-08-03 DIAGNOSIS — N3001 Acute cystitis with hematuria: Secondary | ICD-10-CM | POA: Insufficient documentation

## 2013-08-03 DIAGNOSIS — N3000 Acute cystitis without hematuria: Secondary | ICD-10-CM | POA: Diagnosis not present

## 2013-08-03 DIAGNOSIS — E669 Obesity, unspecified: Secondary | ICD-10-CM

## 2013-08-03 DIAGNOSIS — J449 Chronic obstructive pulmonary disease, unspecified: Secondary | ICD-10-CM

## 2013-08-03 LAB — POCT URINALYSIS DIPSTICK
Bilirubin, UA: NEGATIVE
Blood, UA: NEGATIVE
Glucose, UA: NEGATIVE
KETONES UA: NEGATIVE
Nitrite, UA: NEGATIVE
PH UA: 7
Protein, UA: NEGATIVE
Spec Grav, UA: 1.015
Urobilinogen, UA: 0.2

## 2013-08-03 MED ORDER — AMLODIPINE BESYLATE 5 MG PO TABS: 5.0000 mg | ORAL_TABLET | Freq: Every day | ORAL | Status: DC

## 2013-08-03 MED ORDER — SIMVASTATIN 20 MG PO TABS: 20.0000 mg | ORAL_TABLET | Freq: Every day | ORAL | Status: DC

## 2013-08-03 MED ORDER — LEVOFLOXACIN 500 MG PO TABS: 500.0000 mg | ORAL_TABLET | Freq: Every day | ORAL | Status: DC

## 2013-08-03 MED ORDER — BENZONATATE 100 MG PO CAPS: 100.0000 mg | ORAL_CAPSULE | Freq: Three times a day (TID) | ORAL | Status: DC | PRN

## 2013-08-03 NOTE — Assessment & Plan Note (Signed)
Continues to deteriorate , due to ongoing nicotine use

## 2013-08-03 NOTE — Assessment & Plan Note (Signed)
Uncontrolled, increase dose of amlodipine DASH diet and commitment to daily physical activity for a minimum of 30 minutes discussed and encouraged, as a part of hypertension management. The importance of attaining a healthy weight is also discussed.

## 2013-08-03 NOTE — Progress Notes (Signed)
   Subjective:    Patient ID: [name], female    DOB: 06/25/1945, 68 y.o.   MRN: [id number]  HPI The PT is here for follow up and re-evaluation of chronic medical conditions, medication management and review of any available recent lab and radiology data.  Preventive health is updated, specifically  Cancer screening and Immunization.   Accidentally hit her left shoulder last week, still has mild pain,  But has full mobility in the shoulder  The PT denies any adverse reactions to current medications since the last visit.  T 2 week h/o chills , increased cough, sputum and 3 day h/o urinary frequency and burning , denies nausea , flank pain , fever or chills       Review of Systems See HPI  Denies sinus pressure, nasal congestion, ear pain or sore throat. . Denies chest pains, palpitations and leg swelling Denies abdominal pain, vomiting,diarrhea or constipation.     Denies headaches, seizures, numbness, or tingling. Denies uncontrolled  depression, anxiety or insomnia. Denies skin break down or rash.        Objective:   Physical Exam  BP 152/80  Pulse 94  Resp 18  Ht 5\' 5"  (1.651 m)  Wt 152 lb (68.947 kg)  BMI 25.29 kg/m2  SpO2 96% Patient alert and oriented and in no cardiopulmonary distress.  HEENT: No facial asymmetry, EOMI,   oropharynx pink and moist.  Neck supple no JVD, no mass.  Chest: decreased air entry throughout , bilateral crackles  And wheezes CVS: S1, S2 no murmurs, no S3.Regular rate.  ABD: Soft non tender. No suprapubic tenderness or flank tenderness  Ext: No edema  MS: Adequate ROM spine, shoulders, hips and knees.  Skin: Intact, no ulcerations or rash noted.  Psych: Good eye contact, normal affect. Memory intact not anxious or depressed appearing.  CNS: CN 2-12 intact, power,  normal throughout.no focal deficits noted.       Assessment & Plan:  HYPERTENSION Uncontrolled, increase dose of amlodipine DASH diet and commitment to  daily physical activity for a minimum of 30 minutes discussed and encouraged, as a part of hypertension management. The importance of attaining a healthy weight is also discussed.   Diabetes mellitus type 2 in obese Improved and controlled Patient advised to reduce carb and sweets, commit to regular physical activity, take meds as prescribed, test blood as directed, and attempt to lose weight, to improve blood sugar control.   NICOTINE ADDICTION Unchanged Patient counseled for approximately 5 minutes regarding the health risks of ongoing nicotine use, specifically all types of cancer, heart disease, stroke and respiratory failure. The options available for help with cessation ,the behavioral changes to assist the process, and the option to either gradully reduce usage  Or abruptly stop.is also discussed. Pt is also encouraged to set specific goals in number of cigarettes used daily, as well as to set a quit date.    ADJ DISORDER WITH MIXED ANXIETY & DEPRESSED MOOD Stable and improved, continue current meds  HYPERLIPIDEMIA Hyperlipidemia:Low fat diet discussed and encouraged.  Updated lab needed at/ before next visit.    OTHER CHRONIC BRONCHITIS Unchanged, tessalon perles and anti biotics  Acute cystitis with hematuria abnormal UA , check c/s , levaquin prescribed  COPD Continues to deteriorate , due to ongoing nicotine use

## 2013-08-03 NOTE — Patient Instructions (Addendum)
Annual wellness in 6 weeks, call if you need me before  Increase in amlodipine for BP to 77m daily, pls take TWO 2.5 mg daily till done then ONE 5108mtab once daily is your new tablet  We will add the lipid and hepatic panel and EGFR to current labs and let you know if there is a problem  For your cough and possible urine infection levaquin 50010mne daily #5 tablets , and tessalon perles one three time daily for 1 week.  Stop ibuprofen   Left shoulder has full mobility, but will get xray if you wish  Pls work on stopping smoking

## 2013-08-03 NOTE — Assessment & Plan Note (Signed)
Unchanged, tessalon perles and anti biotics

## 2013-08-03 NOTE — Assessment & Plan Note (Addendum)
abnormal UA , check c/s , levaquin prescribed

## 2013-08-03 NOTE — Assessment & Plan Note (Signed)
Improved and controlled Patient advised to reduce carb and sweets, commit to regular physical activity, take meds as prescribed, test blood as directed, and attempt to lose weight, to improve blood sugar control.

## 2013-08-03 NOTE — Assessment & Plan Note (Signed)
Unchanged. Patient counseled for approximately 5 minutes regarding the health risks of ongoing nicotine use, specifically all types of cancer, heart disease, stroke and respiratory failure. The options available for help with cessation ,the behavioral changes to assist the process, and the option to either gradully reduce usage  Or abruptly stop.is also discussed. Pt is also encouraged to set specific goals in number of cigarettes used daily, as well as to set a quit date.  

## 2013-08-03 NOTE — Assessment & Plan Note (Signed)
Hyperlipidemia:Low fat diet discussed and encouraged.  Updated lab needed at/ before next visit.  

## 2013-08-03 NOTE — Assessment & Plan Note (Signed)
Stable and improved, continue current meds

## 2013-08-06 LAB — URINE CULTURE

## 2013-08-08 ENCOUNTER — Other Ambulatory Visit: Payer: Self-pay | Admitting: Family Medicine

## 2013-08-10 ENCOUNTER — Telehealth: Payer: Self-pay | Admitting: Family Medicine

## 2013-08-10 MED ORDER — IPRATROPIUM BROMIDE HFA 17 MCG/ACT IN AERS: INHALATION_SPRAY | RESPIRATORY_TRACT | Status: DC

## 2013-08-10 NOTE — Telephone Encounter (Signed)
Wanting a refill on albuterol for her nebulizer but its not on her list. Called [facility] and last time she got was in 2012 but pt states she has been using it. Also states that her antibiotic that she got didn't help her respiratory symptoms. Still sneezing a lot and coughing up beige phlegm. Better but not gone. Please advise

## 2013-08-11 ENCOUNTER — Other Ambulatory Visit: Payer: Self-pay | Admitting: Family Medicine

## 2013-08-11 NOTE — Telephone Encounter (Signed)
Sputum culture recommended by Dr but pt has transport issues> wants to hold off right now. Will call back if she decides to come in. Albuterol to be refilled per Dr

## 2013-08-12 ENCOUNTER — Other Ambulatory Visit: Payer: Self-pay

## 2013-08-12 DIAGNOSIS — J42 Unspecified chronic bronchitis: Secondary | ICD-10-CM

## 2013-08-14 ENCOUNTER — Telehealth: Payer: Self-pay | Admitting: Family Medicine

## 2013-08-14 NOTE — Telephone Encounter (Signed)
Just took a round of antibiotics for uti. Advised if she thinks she has another infection she would need to go to the urgent care

## 2013-08-14 NOTE — Telephone Encounter (Signed)
Thinks she may have a kidney stone or infection she went to the bathroom and stream a little at a time and burning and back and right side hurts in bottom of stomach please call back

## 2013-08-16 LAB — RESPIRATORY CULTURE OR RESPIRATORY AND SPUTUM CULTURE: Organism ID, Bacteria: NORMAL

## 2013-08-31 ENCOUNTER — Ambulatory Visit (HOSPITAL_COMMUNITY)
Admission: RE | Admit: 2013-08-31 | Discharge: 2013-08-31 | Disposition: A | Discharge status: Home / Self Care | Payer: Medicare Other | Source: Ambulatory Visit | Attending: Pulmonary Disease | Admitting: Pulmonary Disease

## 2013-08-31 DIAGNOSIS — R918 Other nonspecific abnormal finding of lung field: Secondary | ICD-10-CM | POA: Diagnosis not present

## 2013-08-31 DIAGNOSIS — J984 Other disorders of lung: Secondary | ICD-10-CM | POA: Diagnosis not present

## 2013-08-31 DIAGNOSIS — J4489 Other specified chronic obstructive pulmonary disease: Secondary | ICD-10-CM | POA: Diagnosis not present

## 2013-08-31 DIAGNOSIS — J449 Chronic obstructive pulmonary disease, unspecified: Secondary | ICD-10-CM | POA: Diagnosis not present

## 2013-08-31 DIAGNOSIS — Z09 Encounter for follow-up examination after completed treatment for conditions other than malignant neoplasm: Secondary | ICD-10-CM | POA: Diagnosis not present

## 2013-09-01 ENCOUNTER — Encounter: Payer: Self-pay | Admitting: Pulmonary Disease

## 2013-09-08 DIAGNOSIS — I10 Essential (primary) hypertension: Secondary | ICD-10-CM | POA: Diagnosis not present

## 2013-09-08 DIAGNOSIS — M25569 Pain in unspecified knee: Secondary | ICD-10-CM | POA: Diagnosis not present

## 2013-09-08 DIAGNOSIS — M79609 Pain in unspecified limb: Secondary | ICD-10-CM | POA: Diagnosis not present

## 2013-09-08 DIAGNOSIS — M545 Low back pain, unspecified: Secondary | ICD-10-CM | POA: Diagnosis not present

## 2013-09-08 DIAGNOSIS — M25519 Pain in unspecified shoulder: Secondary | ICD-10-CM | POA: Diagnosis not present

## 2013-09-08 DIAGNOSIS — M25549 Pain in joints of unspecified hand: Secondary | ICD-10-CM | POA: Diagnosis not present

## 2013-09-09 ENCOUNTER — Inpatient Hospital Stay (HOSPITAL_COMMUNITY)
Admission: EM | Admit: 2013-09-09 | Discharge: 2013-09-11 | DRG: 392 | Disposition: A | Discharge status: Home / Self Care | Payer: Medicare Other | Attending: Internal Medicine | Admitting: Internal Medicine

## 2013-09-09 ENCOUNTER — Encounter (HOSPITAL_COMMUNITY): Payer: Self-pay | Admitting: Emergency Medicine

## 2013-09-09 DIAGNOSIS — R5381 Other malaise: Secondary | ICD-10-CM

## 2013-09-09 DIAGNOSIS — R1115 Cyclical vomiting syndrome unrelated to migraine: Secondary | ICD-10-CM | POA: Diagnosis not present

## 2013-09-09 DIAGNOSIS — F4323 Adjustment disorder with mixed anxiety and depressed mood: Secondary | ICD-10-CM

## 2013-09-09 DIAGNOSIS — R109 Unspecified abdominal pain: Secondary | ICD-10-CM

## 2013-09-09 DIAGNOSIS — Z79899 Other long term (current) drug therapy: Secondary | ICD-10-CM

## 2013-09-09 DIAGNOSIS — K3189 Other diseases of stomach and duodenum: Secondary | ICD-10-CM | POA: Diagnosis not present

## 2013-09-09 DIAGNOSIS — F17200 Nicotine dependence, unspecified, uncomplicated: Secondary | ICD-10-CM

## 2013-09-09 DIAGNOSIS — R1084 Generalized abdominal pain: Secondary | ICD-10-CM | POA: Diagnosis not present

## 2013-09-09 DIAGNOSIS — E1169 Type 2 diabetes mellitus with other specified complication: Secondary | ICD-10-CM

## 2013-09-09 DIAGNOSIS — K529 Noninfective gastroenteritis and colitis, unspecified: Secondary | ICD-10-CM | POA: Diagnosis present

## 2013-09-09 DIAGNOSIS — R1013 Epigastric pain: Secondary | ICD-10-CM

## 2013-09-09 DIAGNOSIS — J42 Unspecified chronic bronchitis: Secondary | ICD-10-CM | POA: Diagnosis not present

## 2013-09-09 DIAGNOSIS — Z9049 Acquired absence of other specified parts of digestive tract: Secondary | ICD-10-CM

## 2013-09-09 DIAGNOSIS — M159 Polyosteoarthritis, unspecified: Secondary | ICD-10-CM

## 2013-09-09 DIAGNOSIS — D72829 Elevated white blood cell count, unspecified: Secondary | ICD-10-CM | POA: Diagnosis present

## 2013-09-09 DIAGNOSIS — M8580 Other specified disorders of bone density and structure, unspecified site: Secondary | ICD-10-CM

## 2013-09-09 DIAGNOSIS — E119 Type 2 diabetes mellitus without complications: Secondary | ICD-10-CM | POA: Diagnosis present

## 2013-09-09 DIAGNOSIS — M1990 Unspecified osteoarthritis, unspecified site: Secondary | ICD-10-CM | POA: Diagnosis present

## 2013-09-09 DIAGNOSIS — Z6825 Body mass index (BMI) 25.0-25.9, adult: Secondary | ICD-10-CM

## 2013-09-09 DIAGNOSIS — E669 Obesity, unspecified: Secondary | ICD-10-CM

## 2013-09-09 DIAGNOSIS — K5900 Constipation, unspecified: Secondary | ICD-10-CM | POA: Diagnosis present

## 2013-09-09 DIAGNOSIS — N3001 Acute cystitis with hematuria: Secondary | ICD-10-CM

## 2013-09-09 DIAGNOSIS — J449 Chronic obstructive pulmonary disease, unspecified: Secondary | ICD-10-CM

## 2013-09-09 DIAGNOSIS — R5383 Other fatigue: Secondary | ICD-10-CM

## 2013-09-09 DIAGNOSIS — K5909 Other constipation: Secondary | ICD-10-CM | POA: Diagnosis present

## 2013-09-09 DIAGNOSIS — R112 Nausea with vomiting, unspecified: Secondary | ICD-10-CM

## 2013-09-09 DIAGNOSIS — N83209 Unspecified ovarian cyst, unspecified side: Secondary | ICD-10-CM

## 2013-09-09 DIAGNOSIS — K5289 Other specified noninfective gastroenteritis and colitis: Principal | ICD-10-CM | POA: Diagnosis present

## 2013-09-09 DIAGNOSIS — C50919 Malignant neoplasm of unspecified site of unspecified female breast: Secondary | ICD-10-CM | POA: Diagnosis present

## 2013-09-09 DIAGNOSIS — G8929 Other chronic pain: Secondary | ICD-10-CM | POA: Diagnosis present

## 2013-09-09 DIAGNOSIS — M6740 Ganglion, unspecified site: Secondary | ICD-10-CM

## 2013-09-09 DIAGNOSIS — Z0000 Encounter for general adult medical examination without abnormal findings: Secondary | ICD-10-CM

## 2013-09-09 DIAGNOSIS — E785 Hyperlipidemia, unspecified: Secondary | ICD-10-CM

## 2013-09-09 DIAGNOSIS — Z7982 Long term (current) use of aspirin: Secondary | ICD-10-CM | POA: Diagnosis not present

## 2013-09-09 DIAGNOSIS — J4489 Other specified chronic obstructive pulmonary disease: Secondary | ICD-10-CM | POA: Diagnosis present

## 2013-09-09 DIAGNOSIS — I10 Essential (primary) hypertension: Secondary | ICD-10-CM | POA: Diagnosis present

## 2013-09-09 DIAGNOSIS — K8020 Calculus of gallbladder without cholecystitis without obstruction: Secondary | ICD-10-CM | POA: Diagnosis not present

## 2013-09-09 DIAGNOSIS — R918 Other nonspecific abnormal finding of lung field: Secondary | ICD-10-CM

## 2013-09-09 LAB — CBC WITH DIFFERENTIAL/PLATELET
BASOS ABS: 0 10*3/uL (ref 0.0–0.1)
Basophils Relative: 0 % (ref 0–1)
EOS PCT: 1 % (ref 0–5)
Eosinophils Absolute: 0.1 10*3/uL (ref 0.0–0.7)
HEMATOCRIT: 47.6 % — AB (ref 36.0–46.0)
Hemoglobin: 16.1 g/dL — ABNORMAL HIGH (ref 12.0–15.0)
Lymphocytes Relative: 9 % — ABNORMAL LOW (ref 12–46)
Lymphs Abs: 1.9 10*3/uL (ref 0.7–4.0)
MCH: 30.1 pg (ref 26.0–34.0)
MCHC: 33.8 g/dL (ref 30.0–36.0)
MCV: 89 fL (ref 78.0–100.0)
Monocytes Absolute: 1.3 10*3/uL — ABNORMAL HIGH (ref 0.1–1.0)
Monocytes Relative: 6 % (ref 3–12)
NEUTROS ABS: 16.9 10*3/uL — AB (ref 1.7–7.7)
Neutrophils Relative %: 84 % — ABNORMAL HIGH (ref 43–77)
Platelets: 381 10*3/uL (ref 150–400)
RBC: 5.35 MIL/uL — ABNORMAL HIGH (ref 3.87–5.11)
RDW: 13.7 % (ref 11.5–15.5)
WBC: 20.3 10*3/uL — ABNORMAL HIGH (ref 4.0–10.5)

## 2013-09-09 MED ORDER — ONDANSETRON HCL 4 MG/2ML IJ SOLN
4.0000 mg | Freq: Once | INTRAMUSCULAR | Status: AC
  Administered 2013-09-09: 4 mg via INTRAMUSCULAR
  Filled 2013-09-09: qty 2

## 2013-09-09 MED ORDER — MORPHINE SULFATE 4 MG/ML IJ SOLN
4.0000 mg | Freq: Once | INTRAMUSCULAR | Status: AC
  Administered 2013-09-09: 4 mg via INTRAVENOUS
  Filled 2013-09-09: qty 1

## 2013-09-09 NOTE — ED Notes (Signed)
Pt states she has had N&V x3hours, abd pain, SOB with nasal drainage and coughing "up some stuff". States she is a smoker and has taken a hydrocodone pain pill prior to arrival.

## 2013-09-09 NOTE — ED Provider Notes (Signed)
CSN: [id number]     Arrival date & time 09/09/13  2306 History  This chart was scribed for Merryl Hacker, MD by Randa Evens, ED Scribe. This patient was seen in room [facility]03/[facility]03 and the patient's care was started at 11:20 PM.      Chief Complaint  Patient presents with  . Abdominal Pain   Patient is a 68 y.o. female presenting with abdominal pain. The history is provided by the patient. No language interpreter was used.  Abdominal Pain Associated symptoms: cough, nausea and vomiting   Associated symptoms: no chest pain, no diarrhea, no dysuria, no fever and no shortness of breath    HPI Comments: [name] is a 68 y.o. female who presents to the Emergency Department complaining of  Cramping abdominal pain onset 3 hours prior. She states associated nausea, vomiting, and cough. She states that her pain is 10/10. She denies any modifying factors. She states she is a current every day smoker. She states that her symptoms don't feel similar to when she was diagnosed with diverticulitis. Denies diarrhea, dysuria, fever, chest pain, or SOB.  History of colectomy.  Past Medical History  Diagnosis Date  . Hypertension   . Breast cancer, left breast   . Nicotine dependence   . COPD (chronic obstructive pulmonary disease)   . Osteoarthritis   . Kidney stones   . Diverticula of colon   . Hyperlipidemia   . Breast cancer 03/11/2012    Stage I (T1b N0 M0), grade 1 well-differentiated carcinoma of the left breast status, post lumpectomy followed by radiation therapy. Her estrogen receptor receptors were 93%, progesterone receptors 67%. HER-2/neu was negative. No lymphovascular space invasion was seen. All margins were clear. Ki-67 marker was low at 1% with surgery on 11/15/2004. Treated then with post-lumpectomy radiation, finish  . Diabetes mellitus without complication    Past Surgical History  Procedure Laterality Date  . Abdominal hysterectomy    . Tubal ligation    . Left breast        cancer, in 2006  . Colectomy      2005, diverticulitis  . Breast surgery  2005 approx    left lumpectomy   . Dilation and curettage of uterus     Family History  Problem Relation Age of Onset  . Cancer Mother     breast  . COPD Father   . Cancer Sister     cancer   History  Substance Use Topics  . Smoking status: Current Every Day Smoker -- 0.50 packs/day for 50 years    Types: Cigarettes  . Smokeless tobacco: Current User     Comment: Using E-Cig  . Alcohol Use: No   OB History   Grav Para Term Preterm Abortions TAB SAB Ect Mult Living   '8 5 5  3  3   2     ' Review of Systems  Constitutional: Negative for fever.  Respiratory: Positive for cough. Negative for chest tightness and shortness of breath.   Cardiovascular: Negative for chest pain.  Gastrointestinal: Positive for nausea, vomiting and abdominal pain. Negative for diarrhea.  Genitourinary: Negative for dysuria.  Musculoskeletal: Negative for back pain.  Neurological: Negative for headaches.  Psychiatric/Behavioral: Negative for confusion.  All other systems reviewed and are negative.     Allergies  Penicillins and Sulfonamide derivatives  Home Medications   Prior to Admission medications   Medication Sig Start Date End Date Taking? Authorizing Provider  albuterol (PROVENTIL) (2.5 MG/3ML) 0.083% nebulizer  solution USE 1 VIAL IN NEBULIZER 3 TIMES DAILY. 08/11/13   [name], MD  amLODipine (NORVASC) 5 MG tablet Take 1 tablet (5 mg total) by mouth daily. 08/03/13   [name], MD  aspirin 81 MG tablet Take 81 mg by mouth daily.    Historical Provider, MD  benazepril (LOTENSIN) 40 MG tablet Take 1 tablet (40 mg total) by mouth daily. 02/10/13   [name], MD  benzonatate (TESSALON PERLES) 100 MG capsule Take 1 capsule (100 mg total) by mouth 3 (three) times daily as needed for cough. 08/03/13 08/03/14  [name], MD  budesonide-formoterol (SYMBICORT) 80-4.5 MCG/ACT inhaler  Inhale 2 puffs into the lungs 2 (two) times daily.    Historical Provider, MD  Calcium Carbonate-Vitamin D (CALCIUM 600 + D PO) Take 1 tablet by mouth 2 (two) times daily.     Historical Provider, MD  FLUoxetine (PROZAC) 10 MG capsule Take 10 mg by mouth daily. 07/27/13   Historical Provider, MD  Fluoxetine HCl, PMDD, 10 MG TABS Take 1 tablet (10 mg total) by mouth daily. 02/10/13   [name], MD  fluticasone (FLONASE) 50 MCG/ACT nasal spray Place 2 sprays into both nostrils daily.    Historical Provider, MD  HYDROcodone-acetaminophen (NORCO) 7.5-325 MG per tablet Take 1 tablet by mouth every 4 (four) hours as needed for moderate pain or severe pain.     Historical Provider, MD  ibuprofen (ADVIL,MOTRIN) 200 MG tablet Take 200 mg by mouth every 6 (six) hours as needed for pain.    Historical Provider, MD  ipratropium (ATROVENT HFA) 17 MCG/ACT inhaler INHALE 2 PUFFS BY MOUTH FOUR TIMES A DAY. 08/10/13   [name], MD  ipratropium-albuterol (DUONEB) 0.5-2.5 (3) MG/3ML SOLN Take 3 mLs by nebulization daily as needed (for shortness of breath/wheezing).  05/19/10   Elsie Lincoln, MD  levofloxacin (LEVAQUIN) 500 MG tablet Take 1 tablet (500 mg total) by mouth daily. 08/03/13   [name], MD  loratadine (CLARITIN) 10 MG tablet TAKE 1 TABLET BY MOUTH ONCE DAILY FOR ALLERGIES. 06/11/13   [name], MD  metFORMIN (GLUCOPHAGE) 500 MG tablet TAKE 1 TABLET BY MOUTH DAILY WITH BREAKFAST. 06/11/13   [name], MD  Omega-3 Fatty Acids (FISH OIL) 1000 MG CAPS Take 2 capsules by mouth daily.    Historical Provider, MD  ondansetron (ZOFRAN) 4 MG tablet Take 1 tablet (4 mg total) by mouth every 8 (eight) hours as needed for nausea or vomiting. 11/25/12   Francine Graven, DO  polyethylene glycol powder (GLYCOLAX/MIRALAX) powder MIX 1 CAPFUL IN 8 OUNCES OF JUICE OR WATER AND DRINK ONCE DAILY. 02/23/13   [name], MD  potassium chloride SA (K-DUR,KLOR-CON) 20 MEQ tablet TAKE 1  TABLET BY MOUTH DAILY.    [name], MD  simvastatin (ZOCOR) 20 MG tablet Take 1 tablet (20 mg total) by mouth daily at 6 PM. 08/03/13   [name], MD  tiZANidine (ZANAFLEX) 4 MG capsule Take 4 mg by mouth every 8 (eight) hours as needed for muscle spasms.    Historical Provider, MD   Triage Vitals: BP 154/88  Pulse 96  Temp(Src) 98.1 F (36.7 C) (Oral)  Resp 22  Ht '5\' 5"'  (1.651 m)  Wt 145 lb (65.772 kg)  BMI 24.13 kg/m2  SpO2 98%  Physical Exam  Nursing note and vitals reviewed. Constitutional: She is oriented to person, place, and time. No distress.  Elderly  HENT:  Head: Normocephalic and atraumatic.  Eyes: Pupils are equal, round, and reactive to light.  Cardiovascular: Normal rate, regular rhythm and normal heart sounds.   No murmur heard. Pulmonary/Chest: Effort normal and breath sounds normal. No respiratory distress. She has no wheezes.  Abdominal: Soft. Bowel sounds are normal. There is tenderness. There is no rebound and no guarding.  Diffuse tenderness to palpation  Neurological: She is alert and oriented to person, place, and time.  Skin: Skin is warm and dry.  Psychiatric: She has a normal mood and affect.    ED Course  Procedures (including critical care time) Labs Review Labs Reviewed  CBC WITH DIFFERENTIAL - Abnormal; Notable for the following:    WBC 20.3 (*)    RBC 5.35 (*)    Hemoglobin 16.1 (*)    HCT 47.6 (*)    Neutrophils Relative % 84 (*)    Neutro Abs 16.9 (*)    Lymphocytes Relative 9 (*)    Monocytes Absolute 1.3 (*)    All other components within normal limits  COMPREHENSIVE METABOLIC PANEL - Abnormal; Notable for the following:    Glucose, Bld 138 (*)    Calcium 11.2 (*)    GFR calc non Af Amer 85 (*)    Anion gap 16 (*)    All other components within normal limits  URINALYSIS, ROUTINE W REFLEX MICROSCOPIC - Abnormal; Notable for the following:    Hgb urine dipstick MODERATE (*)    Ketones, ur 15 (*)    Protein, ur 30  (*)    All other components within normal limits  URINE MICROSCOPIC-ADD ON - Abnormal; Notable for the following:    Squamous Epithelial / LPF FEW (*)    Bacteria, UA FEW (*)    Casts GRANULAR CAST (*)    All other components within normal limits  CLOSTRIDIUM DIFFICILE BY PCR  CULTURE, BLOOD (ROUTINE X 2)  CULTURE, BLOOD (ROUTINE X 2)  LACTIC ACID, PLASMA  LIPASE, BLOOD  CBC  COMPREHENSIVE METABOLIC PANEL    Imaging Review Dg Chest 2 View  09/10/2013   CLINICAL DATA:  Chest not abdominal pain. Nausea and vomiting. History of left breast cancer, hypertension, COPD and diabetes. Smoker.  EXAM: CHEST  2 VIEW  COMPARISON:  11/25/2012 and chest CT dated 08/31/2013.  FINDINGS: Normal sized heart. Clear lungs. The lungs remain hyperexpanded with mild diffuse peribronchial thickening and accentuation of the interstitial markings. Thoracic spine degenerative changes.  IMPRESSION: 1. No acute abnormality. 2. Stable changes of COPD and chronic bronchitis.   Electronically Signed   By: Enrique Sack M.D.   On: 09/10/2013 01:13   Ct Abdomen Pelvis W Contrast  09/10/2013   CLINICAL DATA:  Abdominal pain, nausea and vomiting.  EXAM: CT ABDOMEN AND PELVIS WITH CONTRAST  TECHNIQUE: Multidetector CT imaging of the abdomen and pelvis was performed using the standard protocol following bolus administration of intravenous contrast.  CONTRAST:  13m OMNIPAQUE IOHEXOL 300 MG/ML SOLN, 1022mOMNIPAQUE IOHEXOL 300 MG/ML SOLN  COMPARISON:  01/23/2013  FINDINGS: Mild dependent changes in the lung bases.  The liver demonstrates mild focal fatty change adjacent to the falciform ligament. Large stone in the gallbladder neck, similar to previous study. No definite inflammatory changes. Spleen, pancreas, adrenal glands, inferior vena cava, and retroperitoneal lymph nodes are unremarkable. Calcification of the abdominal aorta without aneurysm. Scattered stones in the right kidney, largest measuring 4 mm. No hydronephrosis or  ureteral stone. Stomach is mildly distended without wall thickening. Mild dilatation of gas-filled  small bowel with distal decompression. Small bowel wall thickening. Edema in the mesentery. No small bowel pneumatosis. No free air in the abdomen. Changes likely to represent enteritis with possible obstruction.  Pelvis: Small amount of free fluid in the pelvis is likely reactive. Bladder wall is not thickened. No pelvic mass or lymphadenopathy. Colon is stool filled without distention. Appendix is normal. Postoperative changes hit the sigmoid colon. No evidence of diverticulitis. No destructive bone lesions.  IMPRESSION: Mildly dilated fluid-filled small bowel with small bowel wall thickening and distal decompression. Small amount of free fluid in the abdomen and pelvis with mesenteric edema. Changes likely to represent enteritis possibly with obstruction. Cholelithiasis.   Electronically Signed   By: Lucienne Capers M.D.   On: 09/10/2013 01:48     EKG Interpretation EKG independently reviewed by myself: Normal sinus rhythm with a rate of 78, no evidence of acute ST elevation, ischemia, or arrhythmia      MDM   Final diagnoses:  Non-intractable vomiting with nausea, vomiting of unspecified type  Abdominal pain, unspecified abdominal location  Enteritis     Patient presents with abdominal paina dn vomiting.  History of diverticulitis and colectomy.  Nontoxic on exam.  TTP diffusely.  GIven pain and nausea meds.  W/U with evidence of leukocytosis to 20.3 , normal lactate and no evidence of UTI.  CT abdomen obtained to r/o obstruction.  CT with evidence of enteritis ?obstruction.  Patient is feeling better and has been having BM (last this morning).  However, given history would admit for hydration and continued monitoring to insure this is not early obstruction.  I personally performed the services described in this documentation, which was scribed in my presence. The recorded information has been  reviewed and is accurate.    Merryl Hacker, MD 09/10/13 [phone]

## 2013-09-10 ENCOUNTER — Emergency Department (HOSPITAL_COMMUNITY): Payer: Medicare Other

## 2013-09-10 ENCOUNTER — Encounter (HOSPITAL_COMMUNITY): Payer: Self-pay | Admitting: *Deleted

## 2013-09-10 DIAGNOSIS — J449 Chronic obstructive pulmonary disease, unspecified: Secondary | ICD-10-CM | POA: Diagnosis not present

## 2013-09-10 DIAGNOSIS — Z6825 Body mass index (BMI) 25.0-25.9, adult: Secondary | ICD-10-CM | POA: Diagnosis not present

## 2013-09-10 DIAGNOSIS — I10 Essential (primary) hypertension: Secondary | ICD-10-CM

## 2013-09-10 DIAGNOSIS — R109 Unspecified abdominal pain: Secondary | ICD-10-CM

## 2013-09-10 DIAGNOSIS — K529 Noninfective gastroenteritis and colitis, unspecified: Secondary | ICD-10-CM | POA: Diagnosis present

## 2013-09-10 DIAGNOSIS — E669 Obesity, unspecified: Secondary | ICD-10-CM

## 2013-09-10 DIAGNOSIS — K8020 Calculus of gallbladder without cholecystitis without obstruction: Secondary | ICD-10-CM | POA: Diagnosis not present

## 2013-09-10 DIAGNOSIS — C50919 Malignant neoplasm of unspecified site of unspecified female breast: Secondary | ICD-10-CM | POA: Diagnosis present

## 2013-09-10 DIAGNOSIS — K5289 Other specified noninfective gastroenteritis and colitis: Principal | ICD-10-CM

## 2013-09-10 DIAGNOSIS — K3189 Other diseases of stomach and duodenum: Secondary | ICD-10-CM | POA: Diagnosis not present

## 2013-09-10 DIAGNOSIS — F17200 Nicotine dependence, unspecified, uncomplicated: Secondary | ICD-10-CM | POA: Diagnosis present

## 2013-09-10 DIAGNOSIS — Z9049 Acquired absence of other specified parts of digestive tract: Secondary | ICD-10-CM | POA: Diagnosis not present

## 2013-09-10 DIAGNOSIS — G8929 Other chronic pain: Secondary | ICD-10-CM | POA: Diagnosis present

## 2013-09-10 DIAGNOSIS — Z7982 Long term (current) use of aspirin: Secondary | ICD-10-CM | POA: Diagnosis not present

## 2013-09-10 DIAGNOSIS — R112 Nausea with vomiting, unspecified: Secondary | ICD-10-CM | POA: Diagnosis not present

## 2013-09-10 DIAGNOSIS — Z79899 Other long term (current) drug therapy: Secondary | ICD-10-CM | POA: Diagnosis not present

## 2013-09-10 DIAGNOSIS — E119 Type 2 diabetes mellitus without complications: Secondary | ICD-10-CM | POA: Diagnosis not present

## 2013-09-10 DIAGNOSIS — K5900 Constipation, unspecified: Secondary | ICD-10-CM | POA: Diagnosis present

## 2013-09-10 DIAGNOSIS — R1115 Cyclical vomiting syndrome unrelated to migraine: Secondary | ICD-10-CM | POA: Diagnosis present

## 2013-09-10 DIAGNOSIS — M1990 Unspecified osteoarthritis, unspecified site: Secondary | ICD-10-CM | POA: Diagnosis present

## 2013-09-10 DIAGNOSIS — J42 Unspecified chronic bronchitis: Secondary | ICD-10-CM | POA: Diagnosis not present

## 2013-09-10 DIAGNOSIS — E785 Hyperlipidemia, unspecified: Secondary | ICD-10-CM | POA: Diagnosis present

## 2013-09-10 DIAGNOSIS — D72829 Elevated white blood cell count, unspecified: Secondary | ICD-10-CM | POA: Diagnosis present

## 2013-09-10 LAB — COMPREHENSIVE METABOLIC PANEL
ALBUMIN: 4.3 g/dL (ref 3.5–5.2)
ALK PHOS: 74 U/L (ref 39–117)
ALT: 10 U/L (ref 0–35)
ALT: 11 U/L (ref 0–35)
ANION GAP: 13 (ref 5–15)
AST: 14 U/L (ref 0–37)
AST: 17 U/L (ref 0–37)
Albumin: 3.6 g/dL (ref 3.5–5.2)
Alkaline Phosphatase: 79 U/L (ref 39–117)
Anion gap: 16 — ABNORMAL HIGH (ref 5–15)
BILIRUBIN TOTAL: 0.3 mg/dL (ref 0.3–1.2)
BUN: 17 mg/dL (ref 6–23)
BUN: 18 mg/dL (ref 6–23)
CALCIUM: 11.2 mg/dL — AB (ref 8.4–10.5)
CHLORIDE: 97 meq/L (ref 96–112)
CHLORIDE: 96 meq/L (ref 96–112)
CO2: 26 mEq/L (ref 19–32)
CO2: 27 mEq/L (ref 19–32)
CREATININE: 0.76 mg/dL (ref 0.50–1.10)
Calcium: 9.9 mg/dL (ref 8.4–10.5)
Creatinine, Ser: 0.68 mg/dL (ref 0.50–1.10)
GFR calc Af Amer: >90 mL/min (ref >90)
GFR calc non Af Amer: 85 mL/min — ABNORMAL LOW (ref >90)
GFR calc non Af Amer: 88 mL/min — ABNORMAL LOW (ref >90)
GLUCOSE: 178 mg/dL — AB (ref 70–99)
Glucose, Bld: 138 mg/dL — ABNORMAL HIGH (ref 70–99)
POTASSIUM: 3.9 meq/L (ref 3.7–5.3)
Potassium: 4 mEq/L (ref 3.7–5.3)
Sodium: 136 mEq/L — ABNORMAL LOW (ref 137–147)
Sodium: 139 mEq/L (ref 137–147)
Total Bilirubin: 0.4 mg/dL (ref 0.3–1.2)
Total Protein: 7 g/dL (ref 6.0–8.3)
Total Protein: 7.9 g/dL (ref 6.0–8.3)

## 2013-09-10 LAB — URINALYSIS, ROUTINE W REFLEX MICROSCOPIC
Bilirubin Urine: NEGATIVE
Glucose, UA: NEGATIVE mg/dL
Ketones, ur: 15 mg/dL — AB
LEUKOCYTES UA: NEGATIVE
NITRITE: NEGATIVE
PH: 6.5 (ref 5.0–8.0)
Protein, ur: 30 mg/dL — AB
SPECIFIC GRAVITY, URINE: 1.02 (ref 1.005–1.030)
Urobilinogen, UA: 0.2 mg/dL (ref 0.0–1.0)

## 2013-09-10 LAB — URINE MICROSCOPIC-ADD ON

## 2013-09-10 LAB — CBC
HEMATOCRIT: 45.2 % (ref 36.0–46.0)
Hemoglobin: 15.3 g/dL — ABNORMAL HIGH (ref 12.0–15.0)
MCH: 30 pg (ref 26.0–34.0)
MCHC: 33.8 g/dL (ref 30.0–36.0)
MCV: 88.6 fL (ref 78.0–100.0)
Platelets: 330 10*3/uL (ref 150–400)
RBC: 5.1 MIL/uL (ref 3.87–5.11)
RDW: 13.5 % (ref 11.5–15.5)
WBC: 16.2 10*3/uL — AB (ref 4.0–10.5)

## 2013-09-10 LAB — GLUCOSE, CAPILLARY
GLUCOSE-CAPILLARY: 111 mg/dL — AB (ref 70–99)
Glucose-Capillary: 161 mg/dL — ABNORMAL HIGH (ref 70–99)
Glucose-Capillary: 120 mg/dL — ABNORMAL HIGH (ref 70–99)
Glucose-Capillary: 134 mg/dL — ABNORMAL HIGH (ref 70–99)

## 2013-09-10 LAB — HEMOGLOBIN A1C
Hgb A1c MFr Bld: 6 % — ABNORMAL HIGH (ref <5.7)
Mean Plasma Glucose: 126 mg/dL — ABNORMAL HIGH (ref <117)

## 2013-09-10 LAB — LIPASE, BLOOD: LIPASE: 34 U/L (ref 11–59)

## 2013-09-10 LAB — LACTIC ACID, PLASMA: LACTIC ACID, VENOUS: 1.3 mmol/L (ref 0.5–2.2)

## 2013-09-10 MED ORDER — LORAZEPAM 2 MG/ML IJ SOLN
1.0000 mg | Freq: Once | INTRAMUSCULAR | Status: AC
  Administered 2013-09-10: 1 mg via INTRAVENOUS
  Filled 2013-09-10: qty 1

## 2013-09-10 MED ORDER — POLYETHYLENE GLYCOL 3350 17 G PO PACK
17.0000 g | PACK | Freq: Every day | ORAL | Status: DC | PRN
  Administered 2013-09-10: 17 g via ORAL
  Filled 2013-09-10: qty 1

## 2013-09-10 MED ORDER — LORATADINE 10 MG PO TABS: 10.0000 mg | ORAL_TABLET | Freq: Every day | ORAL | Status: DC | PRN

## 2013-09-10 MED ORDER — LORAZEPAM 2 MG/ML IJ SOLN: 1.0000 mg | Freq: Once | INTRAMUSCULAR | Status: DC

## 2013-09-10 MED ORDER — AMLODIPINE BESYLATE 5 MG PO TABS
5.0000 mg | ORAL_TABLET | Freq: Every day | ORAL | Status: DC
  Administered 2013-09-10 – 2013-09-11 (×2): 5 mg via ORAL
  Filled 2013-09-10 (×2): qty 1

## 2013-09-10 MED ORDER — SODIUM CHLORIDE 0.9 % IV SOLN: Freq: Once | INTRAVENOUS | Status: DC

## 2013-09-10 MED ORDER — HYDROMORPHONE HCL PF 1 MG/ML IJ SOLN
0.5000 mg | INTRAMUSCULAR | Status: DC | PRN
  Administered 2013-09-10 – 2013-09-11 (×5): 0.5 mg via INTRAVENOUS
  Filled 2013-09-10 (×5): qty 1

## 2013-09-10 MED ORDER — DOCUSATE SODIUM 100 MG PO CAPS
100.0000 mg | ORAL_CAPSULE | Freq: Two times a day (BID) | ORAL | Status: DC
  Administered 2013-09-10 – 2013-09-11 (×3): 100 mg via ORAL
  Filled 2013-09-10 (×3): qty 1

## 2013-09-10 MED ORDER — BUDESONIDE-FORMOTEROL FUMARATE 80-4.5 MCG/ACT IN AERO
2.0000 | INHALATION_SPRAY | Freq: Two times a day (BID) | RESPIRATORY_TRACT | Status: DC
  Administered 2013-09-11: 2 via RESPIRATORY_TRACT
  Filled 2013-09-10: qty 6.9

## 2013-09-10 MED ORDER — ASPIRIN EC 81 MG PO TBEC
81.0000 mg | DELAYED_RELEASE_TABLET | Freq: Every day | ORAL | Status: DC
  Administered 2013-09-10 – 2013-09-11 (×2): 81 mg via ORAL
  Filled 2013-09-10 (×2): qty 1

## 2013-09-10 MED ORDER — CIPROFLOXACIN IN D5W 400 MG/200ML IV SOLN
400.0000 mg | Freq: Two times a day (BID) | INTRAVENOUS | Status: DC
  Administered 2013-09-10 – 2013-09-11 (×3): 400 mg via INTRAVENOUS
  Filled 2013-09-10 (×3): qty 200

## 2013-09-10 MED ORDER — IOHEXOL 300 MG/ML  SOLN
25.0000 mL | Freq: Once | INTRAMUSCULAR | Status: AC | PRN
  Administered 2013-09-10: 25 mL via ORAL

## 2013-09-10 MED ORDER — TIZANIDINE HCL 4 MG PO TABS: 4.0000 mg | ORAL_TABLET | Freq: Three times a day (TID) | ORAL | Status: DC | PRN

## 2013-09-10 MED ORDER — SODIUM CHLORIDE 0.9 % IV SOLN
INTRAVENOUS | Status: DC
  Administered 2013-09-10 – 2013-09-11 (×2): via INTRAVENOUS

## 2013-09-10 MED ORDER — FLUOXETINE HCL 10 MG PO CAPS
10.0000 mg | ORAL_CAPSULE | Freq: Every day | ORAL | Status: DC
  Administered 2013-09-10 – 2013-09-11 (×2): 10 mg via ORAL
  Filled 2013-09-10 (×5): qty 1

## 2013-09-10 MED ORDER — ONDANSETRON HCL 4 MG/2ML IJ SOLN
4.0000 mg | Freq: Four times a day (QID) | INTRAMUSCULAR | Status: DC | PRN
  Administered 2013-09-10 (×2): 4 mg via INTRAVENOUS
  Filled 2013-09-10 (×2): qty 2

## 2013-09-10 MED ORDER — FLUTICASONE PROPIONATE 50 MCG/ACT NA SUSP
2.0000 | Freq: Every day | NASAL | Status: DC
  Administered 2013-09-10 – 2013-09-11 (×2): 2 via NASAL
  Filled 2013-09-10: qty 16

## 2013-09-10 MED ORDER — INSULIN ASPART 100 UNIT/ML ~~LOC~~ SOLN
0.0000 [IU] | Freq: Three times a day (TID) | SUBCUTANEOUS | Status: DC
  Administered 2013-09-10: 2 [IU] via SUBCUTANEOUS
  Administered 2013-09-10: 1 [IU] via SUBCUTANEOUS
  Administered 2013-09-11: 2 [IU] via SUBCUTANEOUS

## 2013-09-10 MED ORDER — METRONIDAZOLE IN NACL 5-0.79 MG/ML-% IV SOLN
500.0000 mg | Freq: Three times a day (TID) | INTRAVENOUS | Status: DC
  Administered 2013-09-10 – 2013-09-11 (×5): 500 mg via INTRAVENOUS
  Filled 2013-09-10 (×5): qty 100

## 2013-09-10 MED ORDER — IOHEXOL 300 MG/ML  SOLN
100.0000 mL | Freq: Once | INTRAMUSCULAR | Status: AC | PRN
  Administered 2013-09-10: 100 mL via INTRAVENOUS

## 2013-09-10 MED ORDER — ONDANSETRON HCL 4 MG PO TABS: 4.0000 mg | ORAL_TABLET | Freq: Four times a day (QID) | ORAL | Status: DC | PRN

## 2013-09-10 MED ORDER — ENOXAPARIN SODIUM 40 MG/0.4ML ~~LOC~~ SOLN
40.0000 mg | SUBCUTANEOUS | Status: DC
  Administered 2013-09-10 – 2013-09-11 (×2): 40 mg via SUBCUTANEOUS
  Filled 2013-09-10 (×2): qty 0.4

## 2013-09-10 MED ORDER — IPRATROPIUM-ALBUTEROL 0.5-2.5 (3) MG/3ML IN SOLN
3.0000 mL | Freq: Four times a day (QID) | RESPIRATORY_TRACT | Status: DC | PRN
  Administered 2013-09-10 – 2013-09-11 (×2): 3 mL via RESPIRATORY_TRACT
  Filled 2013-09-10 (×2): qty 3

## 2013-09-10 NOTE — H&P (Signed)
PCP:   [name], MD   Chief Complaint:  Nausea and vomiting  HPI: 68 year old female who   has a past medical history of Hypertension; Breast cancer, left breast; Nicotine dependence; COPD (chronic obstructive pulmonary disease); Osteoarthritis; Kidney stones; Diverticula of colon; Hyperlipidemia; Breast cancer (03/11/2012); and Diabetes mellitus without complication. Today presents to the ED with nausea vomiting was started yesterday , was associated with abdominal pain which she rates 10/10 intensity. The pain has improved at this time. She denies any fever no chest pain or shortness of breath. Patient has a history of colectomy in 2005. CT scan abdomen was done which showed mildly dilated fluid-filled small bowel with small oh thickening and distal decompression. Likely representing antritis possibly with obstruction. Lactic acid was within normal range 1.3 Patient does have elevated white count 20.3, with 84% neutrophils.  Allergies:   Allergies  Allergen Reactions  . Penicillins Hives, Shortness Of Breath and Swelling  . Sulfonamide Derivatives Other (See Comments)    Shaking all over, seizure like symptoms. Hospitalization resulted       Past Medical History  Diagnosis Date  . Hypertension   . Breast cancer, left breast   . Nicotine dependence   . COPD (chronic obstructive pulmonary disease)   . Osteoarthritis   . Kidney stones   . Diverticula of colon   . Hyperlipidemia   . Breast cancer 03/11/2012    Stage I (T1b N0 M0), grade 1 well-differentiated carcinoma of the left breast status, post lumpectomy followed by radiation therapy. Her estrogen receptor receptors were 93%, progesterone receptors 67%. HER-2/neu was negative. No lymphovascular space invasion was seen. All margins were clear. Ki-67 marker was low at 1% with surgery on 11/15/2004. Treated then with post-lumpectomy radiation, finish  . Diabetes mellitus without complication     Past Surgical History    Procedure Laterality Date  . Abdominal hysterectomy    . Tubal ligation    . Left breast       cancer, in 2006  . Colectomy      2005, diverticulitis  . Breast surgery  2005 approx    left lumpectomy   . Dilation and curettage of uterus      Prior to Admission medications   Medication Sig Start Date End Date Taking? Authorizing Provider  albuterol (PROVENTIL) (2.5 MG/3ML) 0.083% nebulizer solution USE 1 VIAL IN NEBULIZER 3 TIMES DAILY. 08/11/13   [name], MD  amLODipine (NORVASC) 5 MG tablet Take 1 tablet (5 mg total) by mouth daily. 08/03/13   [name], MD  aspirin 81 MG tablet Take 81 mg by mouth daily.    Historical Provider, MD  benazepril (LOTENSIN) 40 MG tablet Take 1 tablet (40 mg total) by mouth daily. 02/10/13   [name], MD  benzonatate (TESSALON PERLES) 100 MG capsule Take 1 capsule (100 mg total) by mouth 3 (three) times daily as needed for cough. 08/03/13 08/03/14  [name], MD  budesonide-formoterol (SYMBICORT) 80-4.5 MCG/ACT inhaler Inhale 2 puffs into the lungs 2 (two) times daily.    Historical Provider, MD  Calcium Carbonate-Vitamin D (CALCIUM 600 + D PO) Take 1 tablet by mouth 2 (two) times daily.     Historical Provider, MD  FLUoxetine (PROZAC) 10 MG capsule Take 10 mg by mouth daily. 07/27/13   Historical Provider, MD  Fluoxetine HCl, PMDD, 10 MG TABS Take 1 tablet (10 mg total) by mouth daily. 02/10/13   [name], MD  fluticasone Asencion Islam)  50 MCG/ACT nasal spray Place 2 sprays into both nostrils daily.    Historical Provider, MD  HYDROcodone-acetaminophen (NORCO) 7.5-325 MG per tablet Take 1 tablet by mouth every 4 (four) hours as needed for moderate pain or severe pain.     Historical Provider, MD  ibuprofen (ADVIL,MOTRIN) 200 MG tablet Take 200 mg by mouth every 6 (six) hours as needed for pain.    Historical Provider, MD  ipratropium (ATROVENT HFA) 17 MCG/ACT inhaler INHALE 2 PUFFS BY MOUTH FOUR TIMES A DAY. 08/10/13    [name], MD  ipratropium-albuterol (DUONEB) 0.5-2.5 (3) MG/3ML SOLN Take 3 mLs by nebulization daily as needed (for shortness of breath/wheezing).  05/19/10   Elsie Lincoln, MD  levofloxacin (LEVAQUIN) 500 MG tablet Take 1 tablet (500 mg total) by mouth daily. 08/03/13   [name], MD  loratadine (CLARITIN) 10 MG tablet TAKE 1 TABLET BY MOUTH ONCE DAILY FOR ALLERGIES. 06/11/13   [name], MD  metFORMIN (GLUCOPHAGE) 500 MG tablet TAKE 1 TABLET BY MOUTH DAILY WITH BREAKFAST. 06/11/13   [name], MD  Omega-3 Fatty Acids (FISH OIL) 1000 MG CAPS Take 2 capsules by mouth daily.    Historical Provider, MD  ondansetron (ZOFRAN) 4 MG tablet Take 1 tablet (4 mg total) by mouth every 8 (eight) hours as needed for nausea or vomiting. 11/25/12   Francine Graven, DO  polyethylene glycol powder (GLYCOLAX/MIRALAX) powder MIX 1 CAPFUL IN 8 OUNCES OF JUICE OR WATER AND DRINK ONCE DAILY. 02/23/13   [name], MD  potassium chloride SA (K-DUR,KLOR-CON) 20 MEQ tablet TAKE 1 TABLET BY MOUTH DAILY.    [name], MD  simvastatin (ZOCOR) 20 MG tablet Take 1 tablet (20 mg total) by mouth daily at 6 PM. 08/03/13   [name], MD  tiZANidine (ZANAFLEX) 4 MG capsule Take 4 mg by mouth every 8 (eight) hours as needed for muscle spasms.    Historical Provider, MD    Social History:  reports that she has been smoking Cigarettes.  She has a 25 pack-year smoking history. She uses smokeless tobacco. She reports that she does not drink alcohol or use illicit drugs.  Family History  Problem Relation Age of Onset  . Cancer Mother     breast  . COPD Father   . Cancer Sister     cancer     All the positives are listed in BOLD  Review of Systems:  HEENT: Headache, blurred vision, runny nose, sore throat Neck: Hypothyroidism, hyperthyroidism,,lymphadenopathy Chest : Shortness of breath, history of COPD, Asthma Heart : Chest pain, history of coronary arterey  disease GI:  Nausea, vomiting, diarrhea, constipation, GERD GU: Dysuria, urgency, frequency of urination, hematuria Neuro: Stroke, seizures, syncope Psych: Depression, anxiety, hallucinations   Physical Exam: Blood pressure 163/78, pulse 79, temperature 98.3 F (36.8 C), temperature source Oral, resp. rate 20, height '5\' 5"'  (1.651 m), weight 69.446 kg (153 lb 1.6 oz), SpO2 97.00%. Constitutional:   Patient is a well-developed and well-nourished female in no acute distress and cooperative with exam. Head: Normocephalic and atraumatic Mouth: Mucus membranes moist Eyes: PERRL, EOMI, conjunctivae normal Neck: Supple, No Thyromegaly Cardiovascular: RRR, S1 normal, S2 normal Pulmonary/Chest: CTAB, no wheezes, rales, or rhonchi Abdominal: Soft mild generalized tenderness, non-distended, bowel sounds are normal, no masses, organomegaly, or guarding present.  Neurological: A&O x3, Strenght is normal and symmetric bilaterally, cranial nerve II-XII are grossly intact, no focal motor deficit, sensory intact to light touch bilaterally.  Extremities : No Cyanosis, Clubbing or Edema  Labs on Admission:  Basic Metabolic Panel:  Recent Labs Lab 09/09/13 2345  NA 139  K 4.0  CL 96  CO2 27  GLUCOSE 138*  BUN 18  CREATININE 0.76  CALCIUM 11.2*   Liver Function Tests:  Recent Labs Lab 09/09/13 2345  AST 17  ALT 11  ALKPHOS 79  BILITOT 0.4  PROT 7.9  ALBUMIN 4.3    Recent Labs Lab 09/09/13 2345  LIPASE 34   No results found for this basename: AMMONIA,  in the last 168 hours CBC:  Recent Labs Lab 09/09/13 2345  WBC 20.3*  NEUTROABS 16.9*  HGB 16.1*  HCT 47.6*  MCV 89.0  PLT 381   Cardiac Enzymes: No results found for this basename: CKTOTAL, CKMB, CKMBINDEX, TROPONINI,  in the last 168 hours  BNP (last 3 results) No results found for this basename: PROBNP,  in the last 8760 hours CBG: No results found for this basename: GLUCAP,  in the last 168 hours  Radiological  Exams on Admission: Dg Chest 2 View  09/10/2013   CLINICAL DATA:  Chest not abdominal pain. Nausea and vomiting. History of left breast cancer, hypertension, COPD and diabetes. Smoker.  EXAM: CHEST  2 VIEW  COMPARISON:  11/25/2012 and chest CT dated 08/31/2013.  FINDINGS: Normal sized heart. Clear lungs. The lungs remain hyperexpanded with mild diffuse peribronchial thickening and accentuation of the interstitial markings. Thoracic spine degenerative changes.  IMPRESSION: 1. No acute abnormality. 2. Stable changes of COPD and chronic bronchitis.   Electronically Signed   By: Enrique Sack M.D.   On: 09/10/2013 01:13   Ct Abdomen Pelvis W Contrast  09/10/2013   CLINICAL DATA:  Abdominal pain, nausea and vomiting.  EXAM: CT ABDOMEN AND PELVIS WITH CONTRAST  TECHNIQUE: Multidetector CT imaging of the abdomen and pelvis was performed using the standard protocol following bolus administration of intravenous contrast.  CONTRAST:  71m OMNIPAQUE IOHEXOL 300 MG/ML SOLN, 1046mOMNIPAQUE IOHEXOL 300 MG/ML SOLN  COMPARISON:  01/23/2013  FINDINGS: Mild dependent changes in the lung bases.  The liver demonstrates mild focal fatty change adjacent to the falciform ligament. Large stone in the gallbladder neck, similar to previous study. No definite inflammatory changes. Spleen, pancreas, adrenal glands, inferior vena cava, and retroperitoneal lymph nodes are unremarkable. Calcification of the abdominal aorta without aneurysm. Scattered stones in the right kidney, largest measuring 4 mm. No hydronephrosis or ureteral stone. Stomach is mildly distended without wall thickening. Mild dilatation of gas-filled small bowel with distal decompression. Small bowel wall thickening. Edema in the mesentery. No small bowel pneumatosis. No free air in the abdomen. Changes likely to represent enteritis with possible obstruction.  Pelvis: Small amount of free fluid in the pelvis is likely reactive. Bladder wall is not thickened. No pelvic mass  or lymphadenopathy. Colon is stool filled without distention. Appendix is normal. Postoperative changes hit the sigmoid colon. No evidence of diverticulitis. No destructive bone lesions.  IMPRESSION: Mildly dilated fluid-filled small bowel with small bowel wall thickening and distal decompression. Small amount of free fluid in the abdomen and pelvis with mesenteric edema. Changes likely to represent enteritis possibly with obstruction. Cholelithiasis.   Electronically Signed   By: WiLucienne Capers.D.   On: 09/10/2013 01:48    EKG: Independently reviewed. Normal sinus rhythm   Assessment/Plan Principal Problem:   Enteritis Active Problems:   HYPERTENSION   CONSTIPATION, CHRONIC   Diabetes mellitus type 2 in obese  Enteritis Patient  is presenting with symptoms of inflammation of small intestine, will start the patient on Cipro and Flagyl. Obtain stool for C. Difficile. Will also obtain blood cultures x2, lactic acid is within normal range. Patient is not displaying symptoms of nausea vomiting at this time no indication for NG tube this time. Will hold the by mouth medications start IV normal saline at 100 and per hour. Will follow CBC and BMP in a.m.  ? Developing early obstruction of small intestine Patient takes Vicodin 4 times a day for chronic pain. Which could have resulted in possible early SBO versus ileus. We'll keep the patient n.p.o. and monitor no need of NG tube at this time.   Diabetes mellitus Start sliding scale insulin. Hold oral hypoglycemics  Hypercalcemia Calcium 11.2, likely due to dehydration. We'll start IV normal saline and follow CMP in a.m.  Code status: Full code  Family discussion: No family at bedside  Time Spent on Admission: 60 minutes  Jenelle Drennon S [facility] Pager: [phone] 09/10/2013, 3:39 AM  If 7PM-7AM, please contact night-coverage  [email or web address]  Password [email or web address]

## 2013-09-10 NOTE — Progress Notes (Signed)
Patient very drowsy this morning. Easily arousable, but stated she hasn't had any sleep and wants to rest and be left alone. Will continue to monitor.

## 2013-09-10 NOTE — Progress Notes (Signed)
Patient ID: [name]  female  HAL:[id number]    DOB: 10/29/1945    DOA: 09/09/2013  PCP: [name], MD  Assessment/Plan: Principal Problem:   Enteritis vs ileus, leukocytosis: WBC improving  - improving, continue IV fluid hydration, place on clear liquid diet - Continue IV ciprofloxacin and Flagyl    Active Problems:   HYPERTENSION - Currently stable    CONSTIPATION, CHRONIC: Patient takes significant amount of Vicodin for chronic abdominal pain with no bowel regimen  - Placed on Colace BID, MiraLax daily as needed    Diabetes mellitus type 2 in obese - Continue sliding scale insulin, check hemoglobin A1c  History of COPD:  - Currently stable, continue Symbicort, DuoNeb PRN  DVT Prophylaxis: lovenox  Code Status: FC  Family Communication:  Disposition: likely in AM, if tolerating solids  Consultants:  None   Procedures:  None  Antibiotics:  IV cipro  IV flagyl   Subjective: Patient seen and examined, slight nausea but no vomiting, abdominal pain is improving, just feels "sore"    Objective: Weight change:  No intake or output data in the 24 hours ending 09/10/13 0859 Blood pressure 163/78, pulse 79, temperature 98.3 F (36.8 C), temperature source Oral, resp. rate 20, height 5\' 5"  (1.651 m), weight 69.446 kg (153 lb 1.6 oz), SpO2 97.00%.  Physical Exam: General: Alert and awake, oriented x3, not in any acute distress. CVS: S1-S2 clear, no murmur rubs or gallops Chest: clear to auscultation bilaterally, no wheezing, rales or rhonchi Abdomen: soft nontender, nondistended, normal bowel sounds  Extremities: no cyanosis, clubbing or edema noted bilaterally Neuro: Cranial nerves II-XII intact, no focal neurological deficits  Lab Results: Basic Metabolic Panel:  Recent Labs Lab 09/09/13 2345 09/10/13 0655  NA 139 136*  K 4.0 3.9  CL 96 97  CO2 27 26  GLUCOSE 138* 178*  BUN 18 17  CREATININE 0.76 0.68  CALCIUM 11.2* 9.9   Liver  Function Tests:  Recent Labs Lab 09/09/13 2345 09/10/13 0655  AST 17 14  ALT 11 10  ALKPHOS 79 74  BILITOT 0.4 0.3  PROT 7.9 7.0  ALBUMIN 4.3 3.6    Recent Labs Lab 09/09/13 2345  LIPASE 34   No results found for this basename: AMMONIA,  in the last 168 hours CBC:  Recent Labs Lab 09/09/13 2345 09/10/13 0655  WBC 20.3* 16.2*  NEUTROABS 16.9*  --   HGB 16.1* 15.3*  HCT 47.6* 45.2  MCV 89.0 88.6  PLT 381 330   Cardiac Enzymes: No results found for this basename: CKTOTAL, CKMB, CKMBINDEX, TROPONINI,  in the last 168 hours BNP: No components found with this basename: POCBNP,  CBG:  Recent Labs Lab 09/10/13 0750  GLUCAP 161*     Micro Results: No results found for this or any previous visit (from the past 240 hour(s)).  Studies/Results: Dg Chest 2 View  09/10/2013   CLINICAL DATA:  Chest not abdominal pain. Nausea and vomiting. History of left breast cancer, hypertension, COPD and diabetes. Smoker.  EXAM: CHEST  2 VIEW  COMPARISON:  11/25/2012 and chest CT dated 08/31/2013.  FINDINGS: Normal sized heart. Clear lungs. The lungs remain hyperexpanded with mild diffuse peribronchial thickening and accentuation of the interstitial markings. Thoracic spine degenerative changes.  IMPRESSION: 1. No acute abnormality. 2. Stable changes of COPD and chronic bronchitis.   Electronically Signed   By: Enrique Sack M.D.   On: 09/10/2013 01:13   Ct Chest Wo Contrast  08/31/2013  CLINICAL DATA:  Followup pulmonary nodules  EXAM: CT CHEST WITHOUT CONTRAST  TECHNIQUE: Multidetector CT imaging of the chest was performed following the standard protocol without IV contrast.  COMPARISON:  08/25/2012  FINDINGS: There is no pleural effusion identified. Pleural thickening overlying the anterior left upper lobe is again noted and may represent changes of external beam radiation to the left breast. The right upper lobe pulmonary nodules are again identified and appear unchanged from previous  exam. These measure up to 3 mm and are most likely benign. No new or enlarging pulmonary nodules identified.  Normal heart size. There is calcified atherosclerotic disease involving the thoracic aorta as well as the LAD coronary artery. The low right paratracheal lymph node is stable measuring 1 cm.  No axillary or supraclavicular adenopathy identified.  Incidental imaging through the upper abdomen is on unremarkable.  Review of the visualized osseous structures is significant for mild multilevel thoracic degenerative disc disease.  IMPRESSION: 1. No acute findings and no evidence for metastatic disease. 2. Pulmonary nodules in the right upper lobe are unchanged and are most likely benign.   Electronically Signed   By: Kerby Moors M.D.   On: 08/31/2013 15:01   Ct Abdomen Pelvis W Contrast  09/10/2013   CLINICAL DATA:  Abdominal pain, nausea and vomiting.  EXAM: CT ABDOMEN AND PELVIS WITH CONTRAST  TECHNIQUE: Multidetector CT imaging of the abdomen and pelvis was performed using the standard protocol following bolus administration of intravenous contrast.  CONTRAST:  50mL OMNIPAQUE IOHEXOL 300 MG/ML SOLN, 171mL OMNIPAQUE IOHEXOL 300 MG/ML SOLN  COMPARISON:  01/23/2013  FINDINGS: Mild dependent changes in the lung bases.  The liver demonstrates mild focal fatty change adjacent to the falciform ligament. Large stone in the gallbladder neck, similar to previous study. No definite inflammatory changes. Spleen, pancreas, adrenal glands, inferior vena cava, and retroperitoneal lymph nodes are unremarkable. Calcification of the abdominal aorta without aneurysm. Scattered stones in the right kidney, largest measuring 4 mm. No hydronephrosis or ureteral stone. Stomach is mildly distended without wall thickening. Mild dilatation of gas-filled small bowel with distal decompression. Small bowel wall thickening. Edema in the mesentery. No small bowel pneumatosis. No free air in the abdomen. Changes likely to represent  enteritis with possible obstruction.  Pelvis: Small amount of free fluid in the pelvis is likely reactive. Bladder wall is not thickened. No pelvic mass or lymphadenopathy. Colon is stool filled without distention. Appendix is normal. Postoperative changes hit the sigmoid colon. No evidence of diverticulitis. No destructive bone lesions.  IMPRESSION: Mildly dilated fluid-filled small bowel with small bowel wall thickening and distal decompression. Small amount of free fluid in the abdomen and pelvis with mesenteric edema. Changes likely to represent enteritis possibly with obstruction. Cholelithiasis.   Electronically Signed   By: Lucienne Capers M.D.   On: 09/10/2013 01:48    Medications: Scheduled Meds: . sodium chloride   Intravenous Once  . ciprofloxacin  400 mg Intravenous Q12H  . docusate sodium  100 mg Oral BID  . enoxaparin (LOVENOX) injection  40 mg Subcutaneous Q24H  . insulin aspart  0-9 Units Subcutaneous TID WC  . metronidazole  500 mg Intravenous Q8H      LOS: 1 day   RAI,RIPUDEEP M.D. [facility] 09/10/2013, 8:59 AM Pager: [phone]  If 7PM-7AM, please contact night-coverage [email or web address] Password [email or web address]  **Disclaimer: This note was dictated with voice recognition software. Similar sounding words can inadvertently be transcribed and this note may contain transcription errors which may not have  been corrected upon publication of note.**

## 2013-09-10 NOTE — Care Management Note (Signed)
UR completed 

## 2013-09-11 DIAGNOSIS — R1013 Epigastric pain: Secondary | ICD-10-CM

## 2013-09-11 DIAGNOSIS — K3189 Other diseases of stomach and duodenum: Secondary | ICD-10-CM

## 2013-09-11 LAB — CBC
HEMATOCRIT: 39.7 % (ref 36.0–46.0)
Hemoglobin: 13.2 g/dL (ref 12.0–15.0)
MCH: 29.8 pg (ref 26.0–34.0)
MCHC: 33.2 g/dL (ref 30.0–36.0)
MCV: 89.6 fL (ref 78.0–100.0)
Platelets: 343 10*3/uL (ref 150–400)
RBC: 4.43 MIL/uL (ref 3.87–5.11)
RDW: 13.9 % (ref 11.5–15.5)
WBC: 12.2 10*3/uL — AB (ref 4.0–10.5)

## 2013-09-11 LAB — GLUCOSE, CAPILLARY
GLUCOSE-CAPILLARY: 107 mg/dL — AB (ref 70–99)
Glucose-Capillary: 178 mg/dL — ABNORMAL HIGH (ref 70–99)

## 2013-09-11 LAB — BASIC METABOLIC PANEL
ANION GAP: 11 (ref 5–15)
BUN: 21 mg/dL (ref 6–23)
CHLORIDE: 102 meq/L (ref 96–112)
CO2: 28 meq/L (ref 19–32)
Calcium: 9 mg/dL (ref 8.4–10.5)
Creatinine, Ser: 0.83 mg/dL (ref 0.50–1.10)
GFR calc Af Amer: 82 mL/min — ABNORMAL LOW (ref >90)
GFR calc non Af Amer: 71 mL/min — ABNORMAL LOW (ref >90)
GLUCOSE: 107 mg/dL — AB (ref 70–99)
POTASSIUM: 3.8 meq/L (ref 3.7–5.3)
SODIUM: 141 meq/L (ref 137–147)

## 2013-09-11 MED ORDER — DSS 100 MG PO CAPS: 100.0000 mg | ORAL_CAPSULE | Freq: Two times a day (BID) | ORAL | Status: DC

## 2013-09-11 MED ORDER — HYDROCOD POLST-CHLORPHEN POLST 10-8 MG/5ML PO LQCR: 5.0000 mL | Freq: Two times a day (BID) | ORAL | Status: DC | PRN

## 2013-09-11 MED ORDER — METRONIDAZOLE 500 MG PO TABS: 500.0000 mg | ORAL_TABLET | Freq: Three times a day (TID) | ORAL | Status: DC

## 2013-09-11 MED ORDER — BENZONATATE 100 MG PO CAPS
100.0000 mg | ORAL_CAPSULE | Freq: Three times a day (TID) | ORAL | Status: DC
  Administered 2013-09-11: 100 mg via ORAL
  Filled 2013-09-11: qty 1

## 2013-09-11 MED ORDER — HYDROCOD POLST-CHLORPHEN POLST 10-8 MG/5ML PO LQCR
5.0000 mL | Freq: Two times a day (BID) | ORAL | Status: DC | PRN
  Administered 2013-09-11: 5 mL via ORAL
  Filled 2013-09-11: qty 5

## 2013-09-11 MED ORDER — HYDROCODONE-ACETAMINOPHEN 7.5-325 MG PO TABS: 1.0000 | ORAL_TABLET | Freq: Four times a day (QID) | ORAL | Status: DC | PRN

## 2013-09-11 MED ORDER — CIPROFLOXACIN HCL 500 MG PO TABS: 500.0000 mg | ORAL_TABLET | Freq: Two times a day (BID) | ORAL | Status: DC

## 2013-09-11 MED ORDER — HYDROCODONE-HOMATROPINE 5-1.5 MG/5ML PO SYRP: 5.0000 mL | ORAL_SOLUTION | Freq: Four times a day (QID) | ORAL | Status: DC | PRN

## 2013-09-11 MED ORDER — BENZONATATE 100 MG PO CAPS: 100.0000 mg | ORAL_CAPSULE | Freq: Three times a day (TID) | ORAL | Status: DC

## 2013-09-11 MED ORDER — ONDANSETRON HCL 4 MG PO TABS: 4.0000 mg | ORAL_TABLET | Freq: Four times a day (QID) | ORAL | Status: DC | PRN

## 2013-09-11 MED ORDER — CETYLPYRIDINIUM CHLORIDE 0.05 % MT LIQD: 7.0000 mL | Freq: Two times a day (BID) | OROMUCOSAL | Status: DC

## 2013-09-11 MED ORDER — POLYETHYLENE GLYCOL 3350 17 G PO PACK: 17.0000 g | PACK | Freq: Every day | ORAL | Status: DC

## 2013-09-11 MED ORDER — BISACODYL 10 MG RE SUPP
10.0000 mg | Freq: Once | RECTAL | Status: AC
  Administered 2013-09-11: 10 mg via RECTAL
  Filled 2013-09-11: qty 1

## 2013-09-11 NOTE — Progress Notes (Signed)
UR review complete.  

## 2013-09-11 NOTE — Discharge Summary (Signed)
Physician Discharge Summary  Patient ID: [name] MRN: [id number] DOB/AGE: [birth year] 68 y.o.  Admit date: 09/09/2013 Discharge date: 09/11/2013  Primary Care Physician:  [name], MD  Discharge Diagnoses:    . Enteritis . CONSTIPATION, CHRONIC . Diabetes mellitus type 2 in obese . HYPERTENSION  Consults:  None   Recommendations for Outpatient Follow-up:  Patient was recommended good bowel regimen in light of chronic narcotic use  Allergies:   Allergies  Allergen Reactions  . Penicillins Hives, Shortness Of Breath and Swelling  . Sulfonamide Derivatives Other (See Comments)    Shaking all over, seizure like symptoms. Hospitalization resulted      Discharge Medications:   Medication List    STOP taking these medications       levofloxacin 500 MG tablet  Commonly known as:  LEVAQUIN      TAKE these medications       albuterol (2.5 MG/3ML) 0.083% nebulizer solution  Commonly known as:  PROVENTIL  USE 1 VIAL IN NEBULIZER 3 TIMES DAILY.     amLODipine 5 MG tablet  Commonly known as:  NORVASC  Take 1 tablet (5 mg total) by mouth daily.     aspirin 81 MG tablet  Take 81 mg by mouth daily.     benazepril 40 MG tablet  Commonly known as:  LOTENSIN  Take 1 tablet (40 mg total) by mouth daily.     benzonatate 100 MG capsule  Commonly known as:  TESSALON PERLES  Take 1 capsule (100 mg total) by mouth 3 (three) times daily. For cough     budesonide-formoterol 80-4.5 MCG/ACT inhaler  Commonly known as:  SYMBICORT  Inhale 2 puffs into the lungs 2 (two) times daily.     CALCIUM 600 + D PO  Take 1 tablet by mouth 2 (two) times daily.     chlorpheniramine-HYDROcodone 10-8 MG/5ML Lqcr  Commonly known as:  TUSSIONEX  Take 5 mLs by mouth every 12 (twelve) hours as needed for cough.     ciprofloxacin 500 MG tablet  Commonly known as:  CIPRO  Take 1 tablet (500 mg total) by mouth 2 (two) times daily. X 10days     DSS 100 MG Caps  Take 100 mg by  mouth 2 (two) times daily.     Fish Oil 1000 MG Caps  Take 2 capsules by mouth daily.     FLUoxetine 10 MG capsule  Commonly known as:  PROZAC  Take 10 mg by mouth daily.     Fluoxetine HCl (PMDD) 10 MG Tabs  Take 1 tablet (10 mg total) by mouth daily.     fluticasone 50 MCG/ACT nasal spray  Commonly known as:  FLONASE  Place 2 sprays into both nostrils daily.     HYDROcodone-acetaminophen 7.5-325 MG per tablet  Commonly known as:  NORCO  Take 1 tablet by mouth every 6 (six) hours as needed for moderate pain or severe pain.     ibuprofen 200 MG tablet  Commonly known as:  ADVIL,MOTRIN  Take 200 mg by mouth every 6 (six) hours as needed for pain.     ipratropium 17 MCG/ACT inhaler  Commonly known as:  ATROVENT HFA  INHALE 2 PUFFS BY MOUTH FOUR TIMES A DAY.     ipratropium-albuterol 0.5-2.5 (3) MG/3ML Soln  Commonly known as:  DUONEB  Take 3 mLs by nebulization daily as needed (for shortness of breath/wheezing).     loratadine 10 MG tablet  Commonly known as:  CLARITIN  TAKE  1 TABLET BY MOUTH ONCE DAILY FOR ALLERGIES.     metFORMIN 500 MG tablet  Commonly known as:  GLUCOPHAGE  TAKE 1 TABLET BY MOUTH DAILY WITH BREAKFAST.     metroNIDAZOLE 500 MG tablet  Commonly known as:  FLAGYL  Take 1 tablet (500 mg total) by mouth 3 (three) times daily. X 10 days     ondansetron 4 MG tablet  Commonly known as:  ZOFRAN  Take 1 tablet (4 mg total) by mouth every 6 (six) hours as needed for nausea or vomiting.     polyethylene glycol powder powder  Commonly known as:  GLYCOLAX/MIRALAX  MIX 1 CAPFUL IN 8 OUNCES OF JUICE OR WATER AND DRINK ONCE DAILY.     potassium chloride SA 20 MEQ tablet  Commonly known as:  K-DUR,KLOR-CON  TAKE 1 TABLET BY MOUTH DAILY.     simvastatin 20 MG tablet  Commonly known as:  ZOCOR  Take 1 tablet (20 mg total) by mouth daily at 6 PM.     tiZANidine 4 MG capsule  Commonly known as:  ZANAFLEX  Take 4 mg by mouth every 8 (eight) hours as needed  for muscle spasms.         Brief H and P: For complete details please refer to admission H and P, but in brief 68 year old female who has a past medical history of Hypertension; Breast cancer, left breast; Nicotine dependence; COPD (chronic obstructive pulmonary disease); Osteoarthritis; Kidney stones; Diverticula of colon; Hyperlipidemia; Breast cancer (03/11/2012); and Diabetes mellitus without complication.  Today presents to the ED with nausea vomiting was started yesterday , was associated with abdominal pain which she rates 10/10 intensity. The pain has improved at this time. She denies any fever no chest pain or shortness of breath. Patient has a history of colectomy in 2005. CT scan abdomen was done which showed mildly dilated fluid-filled small bowel with small oh thickening and distal decompression. Likely representing antritis possibly with obstruction. Lactic acid was within normal range 1.3  Patient does have elevated white count 20.3, with 84% neutrophils.   Hospital Course:  Enteritis vs ileus, leukocytosis: WBC improving, patient was admitted and placed on n.p.o. status, IV fluid hydration, antiemetics and pain control. Subsequently her symptoms started improving and she tolerated clears, then diet was advanced. At the time of discharge, she is tolerating oral diet. The patient was also placed on IV ciprofloxacin and Flagyl. Patient was recommended good bowel regimen as she takes chronic narcotics.     HYPERTENSION - Currently stable   CONSTIPATION, CHRONIC: Patient takes significant amount of Vicodin for chronic abdominal pain with no bowel regimen  - Placed on Colace BID, MiraLax daily   Diabetes mellitus type 2 in obese  Patient was placed on sliding scale insulin, hemoglobin A1c checked 6.0   History of COPD:  - Currently stable, patient was placed on Symbicort, DuoNeb PRN and added Tussionex and Tessalon Perles for the cough     Day of Discharge BP 128/61  Pulse 94   Temp(Src) 99 F (37.2 C) (Oral)  Resp 20  Ht 5\' 5"  (1.651 m)  Wt 69.446 kg (153 lb 1.6 oz)  BMI 25.48 kg/m2  SpO2 88%  Physical Exam: General: Alert and awake oriented x3 not in any acute distress. HEENT: anicteric sclera, pupils reactive to light and accommodation CVS: S1-S2 clear no murmur rubs or gallops Chest: clear to auscultation bilaterally, no wheezing rales or rhonchi Abdomen: soft nontender, nondistended, normal bowel sounds Extremities: no cyanosis,  clubbing or edema noted bilaterally Neuro: Cranial nerves II-XII intact, no focal neurological deficits   The results of significant diagnostics from this hospitalization (including imaging, microbiology, ancillary and laboratory) are listed below for reference.    LAB RESULTS: Basic Metabolic Panel:  Recent Labs Lab 09/10/13 0655 09/11/13 0558  NA 136* 141  K 3.9 3.8  CL 97 102  CO2 26 28  GLUCOSE 178* 107*  BUN 17 21  CREATININE 0.68 0.83  CALCIUM 9.9 9.0   Liver Function Tests:  Recent Labs Lab 09/09/13 2345 09/10/13 0655  AST 17 14  ALT 11 10  ALKPHOS 79 74  BILITOT 0.4 0.3  PROT 7.9 7.0  ALBUMIN 4.3 3.6    Recent Labs Lab 09/09/13 2345  LIPASE 34   No results found for this basename: AMMONIA,  in the last 168 hours CBC:  Recent Labs Lab 09/09/13 2345 09/10/13 0655 09/11/13 0558  WBC 20.3* 16.2* 12.2*  NEUTROABS 16.9*  --   --   HGB 16.1* 15.3* 13.2  HCT 47.6* 45.2 39.7  MCV 89.0 88.6 89.6  PLT 381 330 343   Cardiac Enzymes: No results found for this basename: CKTOTAL, CKMB, CKMBINDEX, TROPONINI,  in the last 168 hours BNP: No components found with this basename: POCBNP,  CBG:  Recent Labs Lab 09/10/13 2109 09/11/13 0732  GLUCAP 111* 107*    Significant Diagnostic Studies:  Dg Chest 2 View  09/10/2013   CLINICAL DATA:  Chest not abdominal pain. Nausea and vomiting. History of left breast cancer, hypertension, COPD and diabetes. Smoker.  EXAM: CHEST  2 VIEW  COMPARISON:   11/25/2012 and chest CT dated 08/31/2013.  FINDINGS: Normal sized heart. Clear lungs. The lungs remain hyperexpanded with mild diffuse peribronchial thickening and accentuation of the interstitial markings. Thoracic spine degenerative changes.  IMPRESSION: 1. No acute abnormality. 2. Stable changes of COPD and chronic bronchitis.   Electronically Signed   By: Enrique Sack M.D.   On: 09/10/2013 01:13   Ct Abdomen Pelvis W Contrast  09/10/2013   CLINICAL DATA:  Abdominal pain, nausea and vomiting.  EXAM: CT ABDOMEN AND PELVIS WITH CONTRAST  TECHNIQUE: Multidetector CT imaging of the abdomen and pelvis was performed using the standard protocol following bolus administration of intravenous contrast.  CONTRAST:  41mL OMNIPAQUE IOHEXOL 300 MG/ML SOLN, 160mL OMNIPAQUE IOHEXOL 300 MG/ML SOLN  COMPARISON:  01/23/2013  FINDINGS: Mild dependent changes in the lung bases.  The liver demonstrates mild focal fatty change adjacent to the falciform ligament. Large stone in the gallbladder neck, similar to previous study. No definite inflammatory changes. Spleen, pancreas, adrenal glands, inferior vena cava, and retroperitoneal lymph nodes are unremarkable. Calcification of the abdominal aorta without aneurysm. Scattered stones in the right kidney, largest measuring 4 mm. No hydronephrosis or ureteral stone. Stomach is mildly distended without wall thickening. Mild dilatation of gas-filled small bowel with distal decompression. Small bowel wall thickening. Edema in the mesentery. No small bowel pneumatosis. No free air in the abdomen. Changes likely to represent enteritis with possible obstruction.  Pelvis: Small amount of free fluid in the pelvis is likely reactive. Bladder wall is not thickened. No pelvic mass or lymphadenopathy. Colon is stool filled without distention. Appendix is normal. Postoperative changes hit the sigmoid colon. No evidence of diverticulitis. No destructive bone lesions.  IMPRESSION: Mildly dilated  fluid-filled small bowel with small bowel wall thickening and distal decompression. Small amount of free fluid in the abdomen and pelvis with mesenteric edema. Changes likely to represent  enteritis possibly with obstruction. Cholelithiasis.   Electronically Signed   By: Lucienne Capers M.D.   On: 09/10/2013 01:48      Disposition and Follow-up:    DISPOSITION: Home  DIET: Heart healthy diet   DISCHARGE FOLLOW-UP Follow-up Information   Follow up with [name], MD. Schedule an appointment as soon as possible for a visit in 10 days. (for hospital follow-up)    Specialty:  Family Medicine   Contact information:   [address], [place] [address] [phone]       Time spent on Discharge: 35 minutes  Signed:   Minna Dumire M.D. [facility] 09/11/2013, 8:08 AM Pager: [phone]   **Disclaimer: This note was dictated with voice recognition software. Similar sounding words can inadvertently be transcribed and this note may contain transcription errors which may not have been corrected upon publication of note.**

## 2013-09-11 NOTE — Progress Notes (Signed)
Patient ID: [name]  female  YWV:[id number]    DOB: February 04, 1945    DOA: 09/09/2013  PCP: [name], MD  Assessment/Plan: Principal Problem:   Enteritis vs ileus, leukocytosis: WBC improving  - improving, continue IV fluid hydration, tolerated clears yesterday, no symptoms of nausea, vomiting, advance diet to solids - Continue IV ciprofloxacin and Flagyl    Active Problems:   HYPERTENSION - Currently stable    CONSTIPATION, CHRONIC: Patient takes significant amount of Vicodin for chronic abdominal pain with no bowel regimen  - Placed on Colace BID, MiraLax daily as needed    Diabetes mellitus type 2 in obese - Continue sliding scale insulin, check hemoglobin A1c - Hemoglobin A1c 6.0  History of COPD:  - Currently stable, continue Symbicort, DuoNeb PRN -Added Tussionex and Tessalon Perles for the cough   DVT Prophylaxis: lovenox  Code Status: FC  Family Communication:  Disposition: Likely today if tolerating solids  Consultants:  None   Procedures:  None  Antibiotics:  IV cipro  IV flagyl   Subjective: Patient seen and examined, feels okay, tolerating clears, no nausea or vomiting     Objective: Weight change:   Intake/Output Summary (Last 24 hours) at 09/11/13 0806 Last data filed at 09/10/13 1900  Gross per 24 hour  Intake   1540 ml  Output      0 ml  Net   1540 ml   Blood pressure 128/61, pulse 94, temperature 99 F (37.2 C), temperature source Oral, resp. rate 20, height 5\' 5"  (1.651 m), weight 69.446 kg (153 lb 1.6 oz), SpO2 88.00%.  Physical Exam: General: Alert and awake, oriented x3, NAD CVS: S1-S2 clear, no murmur rubs or gallops Chest: clear to auscultation bilaterally, no wheezing, rales or rhonchi Abdomen: soft nontender, nondistended, normal bowel sounds  Extremities: no cyanosis, clubbing or edema noted bilaterally   Lab Results: Basic Metabolic Panel:  Recent Labs Lab 09/10/13 0655 09/11/13 0558  NA 136* 141   K 3.9 3.8  CL 97 102  CO2 26 28  GLUCOSE 178* 107*  BUN 17 21  CREATININE 0.68 0.83  CALCIUM 9.9 9.0   Liver Function Tests:  Recent Labs Lab 09/09/13 2345 09/10/13 0655  AST 17 14  ALT 11 10  ALKPHOS 79 74  BILITOT 0.4 0.3  PROT 7.9 7.0  ALBUMIN 4.3 3.6    Recent Labs Lab 09/09/13 2345  LIPASE 34   No results found for this basename: AMMONIA,  in the last 168 hours CBC:  Recent Labs Lab 09/09/13 2345 09/10/13 0655 09/11/13 0558  WBC 20.3* 16.2* 12.2*  NEUTROABS 16.9*  --   --   HGB 16.1* 15.3* 13.2  HCT 47.6* 45.2 39.7  MCV 89.0 88.6 89.6  PLT 381 330 343   Cardiac Enzymes: No results found for this basename: CKTOTAL, CKMB, CKMBINDEX, TROPONINI,  in the last 168 hours BNP: No components found with this basename: POCBNP,  CBG:  Recent Labs Lab 09/10/13 0750 09/10/13 1136 09/10/13 1708 09/10/13 2109 09/11/13 0732  GLUCAP 161* 120* 134* 111* 107*     Micro Results: Recent Results (from the past 240 hour(s))  CULTURE, BLOOD (ROUTINE X 2)     Status: None   Collection Time    09/10/13  6:44 AM      Result Value Ref Range Status   Specimen Description BLOOD LEFT HAND   Final   Special Requests BOTTLES DRAWN AEROBIC ONLY 4CC   Final   Culture NO GROWTH <24  HRS   Final   Report Status PENDING   Incomplete  CULTURE, BLOOD (ROUTINE X 2)     Status: None   Collection Time    09/10/13  7:59 AM      Result Value Ref Range Status   Specimen Description BLOOD LEFT HAND   Final   Special Requests BOTTLES DRAWN AEROBIC ONLY 6CC   Final   Culture NO GROWTH <24 HRS   Final   Report Status PENDING   Incomplete    Studies/Results: Dg Chest 2 View  09/10/2013   CLINICAL DATA:  Chest not abdominal pain. Nausea and vomiting. History of left breast cancer, hypertension, COPD and diabetes. Smoker.  EXAM: CHEST  2 VIEW  COMPARISON:  11/25/2012 and chest CT dated 08/31/2013.  FINDINGS: Normal sized heart. Clear lungs. The lungs remain hyperexpanded with mild  diffuse peribronchial thickening and accentuation of the interstitial markings. Thoracic spine degenerative changes.  IMPRESSION: 1. No acute abnormality. 2. Stable changes of COPD and chronic bronchitis.   Electronically Signed   By: Enrique Sack M.D.   On: 09/10/2013 01:13   Ct Chest Wo Contrast  08/31/2013   CLINICAL DATA:  Followup pulmonary nodules  EXAM: CT CHEST WITHOUT CONTRAST  TECHNIQUE: Multidetector CT imaging of the chest was performed following the standard protocol without IV contrast.  COMPARISON:  08/25/2012  FINDINGS: There is no pleural effusion identified. Pleural thickening overlying the anterior left upper lobe is again noted and may represent changes of external beam radiation to the left breast. The right upper lobe pulmonary nodules are again identified and appear unchanged from previous exam. These measure up to 3 mm and are most likely benign. No new or enlarging pulmonary nodules identified.  Normal heart size. There is calcified atherosclerotic disease involving the thoracic aorta as well as the LAD coronary artery. The low right paratracheal lymph node is stable measuring 1 cm.  No axillary or supraclavicular adenopathy identified.  Incidental imaging through the upper abdomen is on unremarkable.  Review of the visualized osseous structures is significant for mild multilevel thoracic degenerative disc disease.  IMPRESSION: 1. No acute findings and no evidence for metastatic disease. 2. Pulmonary nodules in the right upper lobe are unchanged and are most likely benign.   Electronically Signed   By: Kerby Moors M.D.   On: 08/31/2013 15:01   Ct Abdomen Pelvis W Contrast  09/10/2013   CLINICAL DATA:  Abdominal pain, nausea and vomiting.  EXAM: CT ABDOMEN AND PELVIS WITH CONTRAST  TECHNIQUE: Multidetector CT imaging of the abdomen and pelvis was performed using the standard protocol following bolus administration of intravenous contrast.  CONTRAST:  66mL OMNIPAQUE IOHEXOL 300 MG/ML  SOLN, 13mL OMNIPAQUE IOHEXOL 300 MG/ML SOLN  COMPARISON:  01/23/2013  FINDINGS: Mild dependent changes in the lung bases.  The liver demonstrates mild focal fatty change adjacent to the falciform ligament. Large stone in the gallbladder neck, similar to previous study. No definite inflammatory changes. Spleen, pancreas, adrenal glands, inferior vena cava, and retroperitoneal lymph nodes are unremarkable. Calcification of the abdominal aorta without aneurysm. Scattered stones in the right kidney, largest measuring 4 mm. No hydronephrosis or ureteral stone. Stomach is mildly distended without wall thickening. Mild dilatation of gas-filled small bowel with distal decompression. Small bowel wall thickening. Edema in the mesentery. No small bowel pneumatosis. No free air in the abdomen. Changes likely to represent enteritis with possible obstruction.  Pelvis: Small amount of free fluid in the pelvis is likely reactive. Bladder wall  is not thickened. No pelvic mass or lymphadenopathy. Colon is stool filled without distention. Appendix is normal. Postoperative changes hit the sigmoid colon. No evidence of diverticulitis. No destructive bone lesions.  IMPRESSION: Mildly dilated fluid-filled small bowel with small bowel wall thickening and distal decompression. Small amount of free fluid in the abdomen and pelvis with mesenteric edema. Changes likely to represent enteritis possibly with obstruction. Cholelithiasis.   Electronically Signed   By: Lucienne Capers M.D.   On: 09/10/2013 01:48    Medications: Scheduled Meds: . sodium chloride   Intravenous Once  . amLODipine  5 mg Oral Daily  . aspirin EC  81 mg Oral Daily  . benzonatate  100 mg Oral TID  . bisacodyl  10 mg Rectal Once  . budesonide-formoterol  2 puff Inhalation BID  . ciprofloxacin  400 mg Intravenous Q12H  . docusate sodium  100 mg Oral BID  . enoxaparin (LOVENOX) injection  40 mg Subcutaneous Q24H  . FLUoxetine  10 mg Oral Daily  . fluticasone   2 spray Each Nare Daily  . insulin aspart  0-9 Units Subcutaneous TID WC  . metronidazole  500 mg Intravenous Q8H  . polyethylene glycol  17 g Oral Daily      LOS: 2 days   Geraldin Habermehl M.D. [facility] 09/11/2013, 8:06 AM Pager: [phone]  If 7PM-7AM, please contact night-coverage [email or web address] Password [email or web address]  **Disclaimer: This note was dictated with voice recognition software. Similar sounding words can inadvertently be transcribed and this note may contain transcription errors which may not have been corrected upon publication of note.**

## 2013-09-11 NOTE — Care Management Note (Signed)
    Page 1 of 1   09/11/2013     3:38:58 PM CARE MANAGEMENT NOTE 09/11/2013  Patient:  [name], [name]   Account Number:  [id number]  Date Initiated:  09/11/2013  Documentation initiated by:  Jolene Provost  Subjective/Objective Assessment:   Pt is from home alone, pt has CAP aid that visits. pt has no DME's or medication needs prior to admission.     Action/Plan:   Pt plans to discharge home with self care. No CM needs noted at this time.   Anticipated DC Date:  09/13/2013   Anticipated DC Plan:  [place]  CM consult      Choice offered to / List presented to:             Status of service:  Completed, signed off Medicare Important Message given?  YES (If response is "NO", the following Medicare IM given date fields will be blank) Date Medicare IM given:  09/11/2013 Medicare IM given by:  Jolene Provost Date Additional Medicare IM given:   Additional Medicare IM given by:    Discharge Disposition:  HOME/SELF CARE  Per UR Regulation:    If discussed at Long Length of Stay Meetings, dates discussed:    Comments:  09/11/2013 [place], RN, MSN, [facility]

## 2013-09-12 NOTE — Progress Notes (Signed)
Late entry:  09/11/2013 1400 - Patient's IV removed.  Patient tolerated breakfast and lunch.  No nausea, vomiting or diarrhea.  Patient had BM this morning.  Formed stool.  Dr. Tana Coast aware.  Site WNL.  AVS reviewed with patient and patient's daughter.  Verbalized understanding of discharge instructions, medications, physician follow-up.  Patient transported by NT via w/c to main entrance for discharge.  Patient stable at time of discharge.

## 2013-09-15 ENCOUNTER — Other Ambulatory Visit: Payer: Self-pay | Admitting: Family Medicine

## 2013-09-15 ENCOUNTER — Telehealth: Payer: Self-pay | Admitting: *Deleted

## 2013-09-15 LAB — CULTURE, BLOOD (ROUTINE X 2)
CULTURE: NO GROWTH
Culture: NO GROWTH

## 2013-09-15 MED ORDER — PROMETHAZINE HCL 12.5 MG PO TABS: 12.5000 mg | ORAL_TABLET | Freq: Three times a day (TID) | ORAL | Status: DC | PRN

## 2013-09-15 NOTE — Telephone Encounter (Signed)
Ok to change to phenergan? What directions?

## 2013-09-15 NOTE — Telephone Encounter (Signed)
Pl let pt know phenergan sent in lace of zofran and fax, tell her i hope she gets better soon, pls fax and notify pharmacy

## 2013-09-15 NOTE — Telephone Encounter (Signed)
See message.

## 2013-09-15 NOTE — Telephone Encounter (Signed)
Pt said to please change her medication from zofran to feritigin the zofran is 40$ and pt does not have the money for zofran. Please advise [phone]

## 2013-09-17 ENCOUNTER — Other Ambulatory Visit (HOSPITAL_COMMUNITY)
Admission: RE | Admit: 2013-09-17 | Discharge: 2013-09-17 | Disposition: A | Discharge status: Home / Self Care | Payer: Medicare Other | Source: Ambulatory Visit | Attending: Family Medicine | Admitting: Family Medicine

## 2013-09-17 ENCOUNTER — Encounter: Payer: Self-pay | Admitting: Family Medicine

## 2013-09-17 ENCOUNTER — Ambulatory Visit (INDEPENDENT_AMBULATORY_CARE_PROVIDER_SITE_OTHER): Payer: Medicare Other | Admitting: Family Medicine

## 2013-09-17 VITALS — BP 132/74 | HR 89 | Resp 16 | Ht 65.0 in | Wt 153.4 lb

## 2013-09-17 DIAGNOSIS — Z1272 Encounter for screening for malignant neoplasm of vagina: Secondary | ICD-10-CM

## 2013-09-17 DIAGNOSIS — Z1239 Encounter for other screening for malignant neoplasm of breast: Secondary | ICD-10-CM | POA: Diagnosis not present

## 2013-09-17 DIAGNOSIS — I10 Essential (primary) hypertension: Secondary | ICD-10-CM

## 2013-09-17 DIAGNOSIS — E669 Obesity, unspecified: Secondary | ICD-10-CM

## 2013-09-17 DIAGNOSIS — Z124 Encounter for screening for malignant neoplasm of cervix: Secondary | ICD-10-CM

## 2013-09-17 DIAGNOSIS — Z23 Encounter for immunization: Secondary | ICD-10-CM

## 2013-09-17 DIAGNOSIS — E1169 Type 2 diabetes mellitus with other specified complication: Secondary | ICD-10-CM

## 2013-09-17 DIAGNOSIS — Z0000 Encounter for general adult medical examination without abnormal findings: Secondary | ICD-10-CM | POA: Insufficient documentation

## 2013-09-17 DIAGNOSIS — E785 Hyperlipidemia, unspecified: Secondary | ICD-10-CM

## 2013-09-17 NOTE — Patient Instructions (Signed)
F/u in January, call if you need me before  Flu vaccine today  Fasting lipid, chem 7, lipid, CBC in on 09/25/2013  (next Wednesday)  Glad that you are doing better   HBa1C, chem 7 and EGFr, non fast for Jan follow up  Flu vaccine today

## 2013-09-17 NOTE — Assessment & Plan Note (Addendum)
Annual exam as documented. Counseling done  re healthy lifestyle involving commitment to 150 minutes exercise per week, heart healthy diet, and attaining healthy weight.The importance of adequate sleep also discussed. Regular seat belt use and safe storage  of firearms if patient has them, is also discussed.  Immunization and cancer screening needs are specifically addressed at this visit.

## 2013-09-18 DIAGNOSIS — Z23 Encounter for immunization: Secondary | ICD-10-CM | POA: Diagnosis not present

## 2013-09-18 NOTE — Progress Notes (Signed)
   Subjective:    Patient ID: [name], female    DOB: 05/14/1945, 68 y.o.   MRN: [id number]  HPI    Review of Systems     Objective:   Physical Exam Rectal exam attempted Sphincter tone good. Hemorhoids palpated No stool sample obtained, exam limited due to patient discomfort Pt to return with stool cards  This is a corrected note       Assessment & Plan:

## 2013-09-18 NOTE — Assessment & Plan Note (Signed)
Vaccine administered at visit.  

## 2013-09-18 NOTE — Progress Notes (Signed)
   Subjective:    Patient ID: [name], female    DOB: 1945/02/04, 68 y.o.   MRN: [id number]  HPI Patient is in for pelvic and breast exam. She was recently hospitalized for acuter intestinal obsrtuction and reports full  recovery. Cutting back on cigarettes , but still unwilling to set a quit date  Review of Systems See HPI     Objective:   Physical Exam BP 132/74  Pulse 89  Resp 16  Ht 5\' 5"  (1.651 m)  Wt 153 lb 6.4 oz (69.582 kg)  BMI 25.53 kg/m2  SpO2 96% Pleasant well nourished female, alert and oriented x 3, in no cardio-pulmonary distress. Afebrile.   Breast: No asymetry,no masses or lumps. No tenderness. No nipple discharge or inversion. No axillary or supraclavicular adenopathy  Rectal:  Normal sphincter tone. No mass.No rectal masses.  Guaiac negative stool.  GU: External genitalia normal female genitalia , female distribution of hair. No lesions. Urethral meatus normal in size, no  Prolapse, no lesions visibly  Present. Bladder non tender. Vagina pink and moist , with no visible lesions , discharge present . Adequate pelvic support no  cystocele or rectocele noted Cervix pink and appears healthy, no lesions or ulcerations noted, no discharge noted from os Uterus normal size, no adnexal masses, no cervical motion or adnexal tenderness.           Assessment & Plan:

## 2013-09-21 ENCOUNTER — Other Ambulatory Visit: Payer: Self-pay | Admitting: Family Medicine

## 2013-09-22 LAB — CYTOLOGY - PAP

## 2013-09-23 ENCOUNTER — Ambulatory Visit (INDEPENDENT_AMBULATORY_CARE_PROVIDER_SITE_OTHER): Payer: Medicare Other | Admitting: Obstetrics and Gynecology

## 2013-09-23 ENCOUNTER — Encounter: Payer: Self-pay | Admitting: Obstetrics and Gynecology

## 2013-09-23 VITALS — BP 138/68 | Ht 63.0 in | Wt 151.6 lb

## 2013-09-23 DIAGNOSIS — D3910 Neoplasm of uncertain behavior of unspecified ovary: Secondary | ICD-10-CM

## 2013-09-23 DIAGNOSIS — D3911 Neoplasm of uncertain behavior of right ovary: Secondary | ICD-10-CM

## 2013-09-23 NOTE — Progress Notes (Signed)
[name]  Patient name: [name] MRN [id number]  Date of birth: 12/27/1945  CC & HPI:  [name] is a 68 y.o. female presenting today for a 6 month F/U for a right ovarian cyst. She states that the cyst is giving her some pain. Other than that she is without complaints.   ROS:  +Right Ovarian Cyst No other complaints.   Pertinent History Reviewed:   Reviewed: Significant for  Medical         Past Medical History  Diagnosis Date  . Hypertension   . Breast cancer, left breast   . Nicotine dependence   . COPD (chronic obstructive pulmonary disease)   . Osteoarthritis   . Kidney stones   . Diverticula of colon   . Hyperlipidemia   . Breast cancer 03/11/2012    Stage I (T1b N0 M0), grade 1 well-differentiated carcinoma of the left breast status, post lumpectomy followed by radiation therapy. Her estrogen receptor receptors were 93%, progesterone receptors 67%. HER-2/neu was negative. No lymphovascular space invasion was seen. All margins were clear. Ki-67 marker was low at 1% with surgery on 11/15/2004. Treated then with post-lumpectomy radiation, finish  . Diabetes mellitus without complication                               Surgical Hx:    Past Surgical History  Procedure Laterality Date  . Abdominal hysterectomy    . Tubal ligation    . Left breast       cancer, in 2006  . Colectomy      2005, diverticulitis  . Breast surgery  2005 approx    left lumpectomy   . Dilation and curettage of uterus     Medications: Reviewed & Updated - see associated section                      Current outpatient prescriptions:albuterol (PROVENTIL) (2.5 MG/3ML) 0.083% nebulizer solution, USE 1 VIAL IN NEBULIZER 3 TIMES DAILY., Disp: 300 mL, Rfl: 0;  amLODipine (NORVASC) 5 MG tablet, Take 1 tablet (5 mg total) by mouth daily., Disp: 30 tablet, Rfl: 3;  aspirin EC 81 MG tablet, Take 81 mg by mouth daily., Disp: , Rfl: ;  benazepril (LOTENSIN) 40 MG tablet, Take 1  tablet (40 mg total) by mouth daily., Disp: 90 tablet, Rfl: 3 benzonatate (TESSALON PERLES) 100 MG capsule, Take 1 capsule (100 mg total) by mouth 3 (three) times daily. For cough, Disp: 30 capsule, Rfl: 0;  Calcium Carbonate-Vitamin D (CALCIUM 600 + D PO), Take 1 tablet by mouth 2 (two) times daily. , Disp: , Rfl: ;  chlorpheniramine-HYDROcodone (TUSSIONEX) 10-8 MG/5ML LQCR, Take 5 mLs by mouth every 12 (twelve) hours as needed for cough., Disp: 115 mL, Rfl: 0 docusate sodium 100 MG CAPS, Take 100 mg by mouth 2 (two) times daily., Disp: 60 capsule, Rfl: 3;  FLUoxetine (PROZAC) 10 MG capsule, Take 10 mg by mouth daily., Disp: , Rfl: ;  fluticasone (FLONASE) 50 MCG/ACT nasal spray, Place 2 sprays into both nostrils daily., Disp: , Rfl: ;  HYDROcodone-acetaminophen (NORCO) 7.5-325 MG per tablet, Take 1 tablet by mouth every 6 (six) hours as needed for moderate pain or severe pain., Disp: 30 tablet, Rfl: 0 ibuprofen (ADVIL,MOTRIN) 200 MG tablet, Take 200 mg by mouth every 6 (six) hours as needed for pain., Disp: , Rfl: ;  ipratropium (ATROVENT  HFA) 17 MCG/ACT inhaler, INHALE 2 PUFFS BY MOUTH FOUR TIMES A DAY., Disp: 12.9 g, Rfl: 3;  loratadine (CLARITIN) 10 MG tablet, TAKE 1 TABLET BY MOUTH ONCE DAILY FOR ALLERGIES., Disp: 30 tablet, Rfl: 3;  metFORMIN (GLUCOPHAGE) 500 MG tablet, TAKE 1 TABLET BY MOUTH DAILY WITH BREAKFAST., Disp: 30 tablet, Rfl: 2 metroNIDAZOLE (FLAGYL) 500 MG tablet, Take 1 tablet (500 mg total) by mouth 3 (three) times daily. X 10 days, Disp: 30 tablet, Rfl: 0;  Omega-3 Fatty Acids (FISH OIL) 1000 MG CAPS, Take 2 capsules by mouth daily., Disp: , Rfl: ;  polyethylene glycol powder (GLYCOLAX/MIRALAX) powder, MIX 1 CAPFUL IN 8 OUNCES OF JUICE OR WATER AND DRINK ONCE DAILY., Disp: 527 g, Rfl: 1 potassium chloride SA (K-DUR,KLOR-CON) 20 MEQ tablet, TAKE 1 TABLET BY MOUTH DAILY., Disp: 30 tablet, Rfl: 2;  promethazine (PHENERGAN) 12.5 MG tablet, Take 1 tablet (12.5 mg total) by mouth every 8 (eight)  hours as needed for nausea or vomiting., Disp: 30 tablet, Rfl: 0;  simvastatin (ZOCOR) 20 MG tablet, Take 1 tablet (20 mg total) by mouth daily at 6 PM., Disp: 30 tablet, Rfl: 3 tiZANidine (ZANAFLEX) 4 MG capsule, Take 4 mg by mouth every 8 (eight) hours as needed for muscle spasms., Disp: , Rfl: ;  ciprofloxacin (CIPRO) 500 MG tablet, Take 1 tablet (500 mg total) by mouth 2 (two) times daily. X 10days, Disp: 20 tablet, Rfl: 0 Current facility-administered medications:Influenza (>/= 3 years) inactive virus vaccine (FLVIRIN/FLUZONE) injection SUSP 0.5 mL, 0.5 mL, Intramuscular, Once, [name], MD   Social History: Reviewed -  reports that she has been smoking Cigarettes.  She has a 25 pack-year smoking history. She uses smokeless tobacco.  Objective Findings:  Vitals: Blood pressure 138/68, height '5\' 3"'  (1.6 m), weight 151 lb 9.6 oz (68.765 kg).  Physical Examination:  Pelvic - normal external genitalia, vulva, vagina, cervix, uterus and adnexa,  VULVA: normal appearing vulva with no masses, tenderness or lesions,  VAGINA: normal appearing vagina with normal color and discharge, no lesions, atrophic, good support atopic, no cystocele or rectocele.  CERVIX: surgically absent,  UTERUS: surgically absent, vaginal cuff well healed,  ADNEXA: normal adnexa in size, nontender and no masses  Fluid pocket present.    Assessment & Plan:   A:  1. Stable right hydrosalpinx  2, recent hx of hospitalization for "bowel blockage' managed conservatively 3  P:  1. Check CA125 today 2. F/U in a year.  Addddum: Ca 125 increased to high normal; will repeat u/s, likely short interval repeat of Ca125.

## 2013-09-24 ENCOUNTER — Telehealth: Payer: Self-pay | Admitting: Obstetrics and Gynecology

## 2013-09-24 LAB — CA 125: CA 125: 24 U/mL (ref <35)

## 2013-09-24 NOTE — Telephone Encounter (Signed)
Ca 125 within normal levels, but increased from 3.7 to 24. Pt informs me that she had hospitalization last week for "bowels locked up" , which might explain the change in labs. Plan" : will obtain u/s pelvis here, and consider shorter interval recheck of Ca 125, likely 3 mos.

## 2013-09-25 ENCOUNTER — Other Ambulatory Visit: Payer: Self-pay | Admitting: Obstetrics and Gynecology

## 2013-09-25 DIAGNOSIS — N83201 Unspecified ovarian cyst, right side: Secondary | ICD-10-CM

## 2013-09-25 DIAGNOSIS — E785 Hyperlipidemia, unspecified: Secondary | ICD-10-CM | POA: Diagnosis not present

## 2013-09-25 DIAGNOSIS — I10 Essential (primary) hypertension: Secondary | ICD-10-CM | POA: Diagnosis not present

## 2013-09-25 LAB — BASIC METABOLIC PANEL WITH GFR
BUN: 15 mg/dL (ref 6–23)
CO2: 27 meq/L (ref 19–32)
Calcium: 9.7 mg/dL (ref 8.4–10.5)
Chloride: 104 meq/L (ref 96–112)
Creat: 0.73 mg/dL (ref 0.50–1.10)
Glucose, Bld: 87 mg/dL (ref 70–99)
Potassium: 5.2 meq/L (ref 3.5–5.3)
Sodium: 139 meq/L (ref 135–145)

## 2013-09-25 LAB — LIPID PANEL
Cholesterol: 159 mg/dL (ref 0–200)
HDL: 54 mg/dL (ref >39)
LDL CALC: 66 mg/dL (ref 0–99)
TRIGLYCERIDES: 194 mg/dL — AB (ref <150)
Total CHOL/HDL Ratio: 2.9 Ratio
VLDL: 39 mg/dL (ref 0–40)

## 2013-09-25 LAB — CBC
HCT: 41.9 % (ref 36.0–46.0)
Hemoglobin: 14.1 g/dL (ref 12.0–15.0)
MCH: 29 pg (ref 26.0–34.0)
MCHC: 33.7 g/dL (ref 30.0–36.0)
MCV: 86.2 fL (ref 78.0–100.0)
Platelets: 473 10*3/uL — ABNORMAL HIGH (ref 150–400)
RBC: 4.86 MIL/uL (ref 3.87–5.11)
RDW: 14 % (ref 11.5–15.5)
WBC: 9.1 10*3/uL (ref 4.0–10.5)

## 2013-09-28 ENCOUNTER — Ambulatory Visit (INDEPENDENT_AMBULATORY_CARE_PROVIDER_SITE_OTHER): Payer: Medicare Other | Admitting: Obstetrics and Gynecology

## 2013-09-28 ENCOUNTER — Other Ambulatory Visit: Payer: Self-pay | Admitting: Obstetrics and Gynecology

## 2013-09-28 ENCOUNTER — Ambulatory Visit (INDEPENDENT_AMBULATORY_CARE_PROVIDER_SITE_OTHER): Payer: Medicare Other

## 2013-09-28 VITALS — BP 120/76 | Ht 63.0 in | Wt 151.0 lb

## 2013-09-28 DIAGNOSIS — N7013 Chronic salpingitis and oophoritis: Secondary | ICD-10-CM | POA: Diagnosis not present

## 2013-09-28 DIAGNOSIS — N7011 Chronic salpingitis: Secondary | ICD-10-CM

## 2013-09-28 DIAGNOSIS — N9489 Other specified conditions associated with female genital organs and menstrual cycle: Secondary | ICD-10-CM

## 2013-09-28 DIAGNOSIS — N949 Unspecified condition associated with female genital organs and menstrual cycle: Secondary | ICD-10-CM

## 2013-09-28 DIAGNOSIS — N83201 Unspecified ovarian cyst, right side: Secondary | ICD-10-CM

## 2013-09-28 NOTE — Progress Notes (Signed)
  Pt is here for discussion of results of recent u/s which shows no significant change in status of small, right adnexal cyst.  We have a reasonable explanation due to a recent history of bowel problems.  She had a recent episode of bowel blockage which may have resulted in an increase in her CA levels.    A:  1. Stable right ovarian cyst with suspected hydrosalpinx.   P: 1. Will repeat CA 125 in 6 weeks.   Konnar Ben V 09/28/2013 4:00 PM  This chart was scribed by Donato Schultz, Medical Scribe, for Dr. Mallory Shirk on 09/28/2013 at 4:16 PM. This chart was reviewed by Dr. Mallory Shirk for accuracy.

## 2013-09-28 NOTE — Progress Notes (Signed)
Patient ID: [name], female   DOB: 01/22/1945, 68 y.o.   MRN: [id number] Pt here today for Results. Pt has had an [place] today and is to then see Dr. Glo Herring to go over results. Pt denies nay problems or concerns at this time.

## 2013-10-05 ENCOUNTER — Other Ambulatory Visit: Payer: Self-pay

## 2013-10-05 MED ORDER — FLUOXETINE HCL 10 MG PO CAPS: 10.0000 mg | ORAL_CAPSULE | Freq: Every day | ORAL | Status: DC

## 2013-10-05 MED ORDER — METFORMIN HCL 500 MG PO TABS: ORAL_TABLET | ORAL | Status: DC

## 2013-10-22 ENCOUNTER — Other Ambulatory Visit: Payer: Self-pay | Admitting: Family Medicine

## 2013-11-09 ENCOUNTER — Other Ambulatory Visit: Payer: Self-pay | Admitting: Family Medicine

## 2013-11-09 ENCOUNTER — Other Ambulatory Visit: Payer: Medicare Other

## 2013-11-09 DIAGNOSIS — D3911 Neoplasm of uncertain behavior of right ovary: Secondary | ICD-10-CM

## 2013-11-10 ENCOUNTER — Telehealth: Payer: Self-pay | Admitting: Obstetrics and Gynecology

## 2013-11-10 DIAGNOSIS — I10 Essential (primary) hypertension: Secondary | ICD-10-CM | POA: Diagnosis not present

## 2013-11-10 DIAGNOSIS — M25511 Pain in right shoulder: Secondary | ICD-10-CM | POA: Diagnosis not present

## 2013-11-10 LAB — CA 125: CA 125: 7 U/mL (ref <35)

## 2013-11-10 NOTE — Telephone Encounter (Signed)
Ca 125 back to low normal at 7.0

## 2013-11-11 ENCOUNTER — Telehealth: Payer: Self-pay | Admitting: *Deleted

## 2013-11-11 NOTE — Telephone Encounter (Signed)
Pt informed of CA 125 result of 7 from 11/09/2013, per Dr. Glo Herring pt to f/u in a year. Pt verbalized understanding.

## 2013-11-14 NOTE — Assessment & Plan Note (Signed)
Updated study needed and dexa ordered Encouraged daily weight  Bearing exercise and calicum and vit D supplements

## 2013-11-14 NOTE — Assessment & Plan Note (Signed)
Cutting back but unwilling to set a quit date Patient counseled for approximately 5 minutes regarding the health risks of ongoing nicotine use, specifically all types of cancer, heart disease, stroke and respiratory failure. The options available for help with cessation ,the behavioral changes to assist the process, and the option to either gradully reduce usage  Or abruptly stop.is also discussed. Pt is also encouraged to set specific goals in number of cigarettes used daily, as well as to set a quit date.

## 2013-11-14 NOTE — Assessment & Plan Note (Signed)
Hyperlipidemia:Low fat diet discussed and encouraged.  Updated lab needed at/ before next visit.  

## 2013-11-14 NOTE — Assessment & Plan Note (Signed)
Uncontrolled add spironolactone  DASH diet and commitment to daily physical activity for a minimum of 30 minutes discussed and encouraged, as a part of hypertension management. The importance of attaining a healthy weight is also discussed.  

## 2013-11-14 NOTE — Assessment & Plan Note (Signed)
Controlled, no change in medication Patient advised to reduce carb and sweets, commit to regular physical activity, take meds as prescribed, test blood as directed, and attempt to lose weight, to improve blood sugar control.  

## 2013-11-14 NOTE — Progress Notes (Signed)
   Subjective:    Patient ID: [name], female    DOB: 01/14/1946, 68 y.o.   MRN: [id number]  HPI The PT is here for follow up and re-evaluation of chronic medical conditions, medication management and review of any available recent lab and radiology data.  Preventive health is updated, specifically  Cancer screening and Immunization.   Questions or concerns regarding consultations or procedures which the PT has had in the interim are  addressed. The PT denies any adverse reactions to current medications since the last visit.  There are no new concerns.  There are no specific complaints       Review of Systems See HPI Denies recent fever or chills. Denies sinus pressure, nasal congestion, ear pain or sore throat. Denies chest congestion, productive cough or wheezing. Denies chest pains, palpitations and leg swelling Denies abdominal pain, nausea, vomiting,diarrhea or constipation.   Denies dysuria, frequency, hesitancy or incontinence. Denies joint pain, swelling and limitation in mobility. Denies headaches, seizures, numbness, or tingling. Denies uncontrolled  depression, anxiety or insomnia. Denies skin break down or rash.        Objective:   Physical Exam  BP 160/80  Pulse 83  Resp 16  Wt 159 lb 1.9 oz (72.176 kg)  SpO2 96% Patient alert and oriented and in no cardiopulmonary distress.  HEENT: No facial asymmetry, EOMI,   oropharynx pink and moist.  Neck supple no JVD, no mass.  Chest: Clear to auscultation bilaterally.  CVS: S1, S2 no murmurs, no S3.Regular rate.  ABD: Soft non tender.   Ext: No edema  MS: Adequate ROM spine, shoulders, hips and knees.  Skin: Intact, no ulcerations or rash noted.  Psych: Good eye contact, normal affect. Memory intact not anxious or depressed appearing.  CNS: CN 2-12 intact, power,  normal throughout.no focal deficits noted.       Assessment & Plan:  Hypertension goal BP (blood pressure) < 130/80 Uncontrolled  add spironolactone DASH diet and commitment to daily physical activity for a minimum of 30 minutes discussed and encouraged, as a part of hypertension management. The importance of attaining a healthy weight is also discussed.   Diabetes mellitus type 2 in obese Controlled, no change in medication Patient advised to reduce carb and sweets, commit to regular physical activity, take meds as prescribed, test blood as directed, and attempt to lose weight, to improve blood sugar control.   Hyperlipidemia LDL goal <100 Hyperlipidemia:Low fat diet discussed and encouraged.  Updated lab needed at/ before next visit.   NICOTINE ADDICTION Cutting back but unwilling to set a quit date Patient counseled for approximately 5 minutes regarding the health risks of ongoing nicotine use, specifically all types of cancer, heart disease, stroke and respiratory failure. The options available for help with cessation ,the behavioral changes to assist the process, and the option to either gradully reduce usage  Or abruptly stop.is also discussed. Pt is also encouraged to set specific goals in number of cigarettes used daily, as well as to set a quit date.   Osteopenia Updated study needed and dexa ordered Encouraged daily weight  Bearing exercise and calicum and vit D supplements

## 2013-11-16 ENCOUNTER — Encounter: Payer: Self-pay | Admitting: Obstetrics and Gynecology

## 2013-11-19 ENCOUNTER — Other Ambulatory Visit: Payer: Self-pay | Admitting: Family Medicine

## 2013-12-14 ENCOUNTER — Other Ambulatory Visit: Payer: Self-pay | Admitting: Family Medicine

## 2013-12-16 ENCOUNTER — Other Ambulatory Visit: Payer: Self-pay | Admitting: Family Medicine

## 2013-12-31 ENCOUNTER — Other Ambulatory Visit (HOSPITAL_COMMUNITY): Payer: Self-pay | Admitting: Oncology

## 2013-12-31 DIAGNOSIS — Z1231 Encounter for screening mammogram for malignant neoplasm of breast: Secondary | ICD-10-CM

## 2014-01-05 ENCOUNTER — Telehealth: Payer: Self-pay | Admitting: *Deleted

## 2014-01-05 ENCOUNTER — Telehealth: Payer: Self-pay | Admitting: Family Medicine

## 2014-01-05 DIAGNOSIS — M25561 Pain in right knee: Secondary | ICD-10-CM | POA: Diagnosis not present

## 2014-01-05 DIAGNOSIS — M79604 Pain in right leg: Secondary | ICD-10-CM | POA: Diagnosis not present

## 2014-01-05 DIAGNOSIS — I10 Essential (primary) hypertension: Secondary | ICD-10-CM | POA: Diagnosis not present

## 2014-01-05 DIAGNOSIS — M25551 Pain in right hip: Secondary | ICD-10-CM | POA: Diagnosis not present

## 2014-01-05 DIAGNOSIS — M25511 Pain in right shoulder: Secondary | ICD-10-CM | POA: Diagnosis not present

## 2014-01-05 NOTE — Telephone Encounter (Signed)
noted 

## 2014-01-05 NOTE — Telephone Encounter (Signed)
Pt states that she thinks she has a UTI, she has pain in the lower part of her stomach and feels like she needs to go and its not coming out like it should. Pt states that she is going every 30 minutes and when she stands she leaks. Pt states that she has had these symptoms for about a week. Phone call switched up front to give pt appointment time for tomorrow.

## 2014-01-06 ENCOUNTER — Encounter: Payer: Self-pay | Admitting: Obstetrics and Gynecology

## 2014-01-06 ENCOUNTER — Telehealth: Payer: Self-pay | Admitting: *Deleted

## 2014-01-06 ENCOUNTER — Ambulatory Visit (INDEPENDENT_AMBULATORY_CARE_PROVIDER_SITE_OTHER): Payer: Medicare Other | Admitting: Obstetrics and Gynecology

## 2014-01-06 DIAGNOSIS — R350 Frequency of micturition: Secondary | ICD-10-CM | POA: Diagnosis not present

## 2014-01-06 LAB — POCT URINALYSIS DIPSTICK
Glucose, UA: NEGATIVE
KETONES UA: NEGATIVE
LEUKOCYTES UA: NEGATIVE
Nitrite, UA: NEGATIVE
PROTEIN UA: NEGATIVE
RBC UA: NEGATIVE

## 2014-01-06 MED ORDER — PHENAZOPYRIDINE HCL 200 MG PO TABS: 200.0000 mg | ORAL_TABLET | Freq: Three times a day (TID) | ORAL | Status: DC | PRN

## 2014-01-06 NOTE — Progress Notes (Signed)
Patient ID: [name], female   DOB: 07/10/1945, 68 y.o.   MRN: [id number] Pt here today for urinary frequency and pain. Pt states that she has had the symptoms for about a week. Marland Kitchen    [facility] Visit  Patient name: [name] MRN [id number]  Date of birth: 24-Mar-1945  CC & HPI:  [name] is a 68 y.o. female presenting today for rlq pain and frequency. U/a negative. Voiding q 30 min.  ROS:  Notes legs thinning. Unknown if weight loss happening.  Pertinent History Reviewed:   Reviewed: Significant for pelvic cyst normal ca-125 followed for a year so far. Medical         Past Medical History  Diagnosis Date  . Hypertension   . Breast cancer, left breast   . Nicotine dependence   . COPD (chronic obstructive pulmonary disease)   . Osteoarthritis   . Kidney stones   . Diverticula of colon   . Hyperlipidemia   . Breast cancer 03/11/2012    Stage I (T1b N0 M0), grade 1 well-differentiated carcinoma of the left breast status, post lumpectomy followed by radiation therapy. Her estrogen receptor receptors were 93%, progesterone receptors 67%. HER-2/neu was negative. No lymphovascular space invasion was seen. All margins were clear. Ki-67 marker was low at 1% with surgery on 11/15/2004. Treated then with post-lumpectomy radiation, finish  . Diabetes mellitus without complication                               Surgical Hx:    Past Surgical History  Procedure Laterality Date  . Abdominal hysterectomy    . Tubal ligation    . Left breast       cancer, in 2006  . Colectomy      2005, diverticulitis  . Breast surgery  2005 approx    left lumpectomy   . Dilation and curettage of uterus     Medications: Reviewed & Updated - see associated section                      Current outpatient prescriptions: albuterol (PROVENTIL) (2.5 MG/3ML) 0.083% nebulizer solution, USE 1 VIAL IN NEBULIZER 3 TIMES DAILY., Disp: 300 mL, Rfl: 0;  amLODipine (NORVASC) 5 MG tablet, Take 1  tablet (5 mg total) by mouth daily., Disp: 30 tablet, Rfl: 3;  aspirin EC 81 MG tablet, Take 81 mg by mouth daily., Disp: , Rfl: ;  ATROVENT HFA 17 MCG/ACT inhaler, INHALE 2 PUFFS BY MOUTH FOUR TIMES A DAY., Disp: 12.9 g, Rfl: 4 benazepril (LOTENSIN) 40 MG tablet, Take 1 tablet (40 mg total) by mouth daily., Disp: 90 tablet, Rfl: 3;  benzonatate (TESSALON PERLES) 100 MG capsule, Take 1 capsule (100 mg total) by mouth 3 (three) times daily. For cough, Disp: 30 capsule, Rfl: 0;  Calcium Carbonate-Vitamin D (CALCIUM 600 + D PO), Take 1 tablet by mouth 2 (two) times daily. , Disp: , Rfl:  chlorpheniramine-HYDROcodone (TUSSIONEX) 10-8 MG/5ML LQCR, Take 5 mLs by mouth every 12 (twelve) hours as needed for cough., Disp: 115 mL, Rfl: 0;  docusate sodium 100 MG CAPS, Take 100 mg by mouth 2 (two) times daily., Disp: 60 capsule, Rfl: 3;  FLUoxetine (PROZAC) 10 MG capsule, Take 1 capsule (10 mg total) by mouth daily., Disp: 30 capsule, Rfl: 3 fluticasone (FLONASE) 50 MCG/ACT nasal spray, Place 2 sprays into both nostrils daily., Disp: ,  Rfl: ;  HYDROcodone-acetaminophen (NORCO) 7.5-325 MG per tablet, Take 1 tablet by mouth every 6 (six) hours as needed for moderate pain or severe pain., Disp: 30 tablet, Rfl: 0;  loratadine (CLARITIN) 10 MG tablet, TAKE 1 TABLET BY MOUTH ONCE DAILY FOR ALLERGIES., Disp: 30 tablet, Rfl: 4 metFORMIN (GLUCOPHAGE) 500 MG tablet, TAKE 1 TABLET BY MOUTH DAILY WITH BREAKFAST., Disp: 30 tablet, Rfl: 3;  Omega-3 Fatty Acids (FISH OIL) 1000 MG CAPS, Take 2 capsules by mouth daily., Disp: , Rfl: ;  polyethylene glycol powder (GLYCOLAX/MIRALAX) powder, MIX 1 CAPFUL IN 8 OUNCES OF JUICE OR WATER AND DRINK ONCE DAILY., Disp: 527 g, Rfl: 3 potassium chloride SA (K-DUR,KLOR-CON) 20 MEQ tablet, TAKE 1 TABLET BY MOUTH DAILY., Disp: 30 tablet, Rfl: 2;  promethazine (PHENERGAN) 12.5 MG tablet, Take 1 tablet (12.5 mg total) by mouth every 8 (eight) hours as needed for nausea or vomiting., Disp: 30 tablet, Rfl:  0;  simvastatin (ZOCOR) 20 MG tablet, Take 1 tablet (20 mg total) by mouth daily at 6 PM., Disp: 30 tablet, Rfl: 3 tiZANidine (ZANAFLEX) 4 MG capsule, Take 4 mg by mouth every 8 (eight) hours as needed for muscle spasms., Disp: , Rfl:  Current facility-administered medications: Influenza (>/= 3 years) inactive virus vaccine (FLVIRIN/FLUZONE) injection SUSP 0.5 mL, 0.5 mL, Intramuscular, Once, [name], MD   Social History: Reviewed -  reports that she has been smoking Cigarettes.  She has a 25 pack-year smoking history. She uses smokeless tobacco.  Objective Findings:  Vitals: There were no vitals taken for this visit.  Physical Examination: Abdomen - soft, nontender, nondistended, no masses or organomegaly scars from previous incisions midline vertical nontender. no hernias noted abd more bloated on right , no masses, some muscle discomfort with movement. Pelvic - VULVA: normal appearing vulva with no masses, tenderness or lesions, VAGINA: normal appearing vagina with normal color and discharge, no lesions, CERVIX: surgically absent, UTERUS: surgically absent, vaginal cuff well healed, ADNEXA: normal adnexa in size, nontender and no masses, no masses, no palpable internal organs   Assessment & Plan:   A:  1. Uti sx without correlating u/a 2. Musculoskeletal discomfort rt side 3. Hx pelvic cyst , felt to be adhesions, with normal Ca125  P:  1. Pyridium as a trial 2 check c&s

## 2014-01-06 NOTE — Telephone Encounter (Signed)
Spoke with pt. Dr Glo Herring ordered Pyridium but pt's insurance don't cover that. The pharmacist advised pt to get AZO. I advised pt that is similar to Pyridium and to take med. Advised if pt's symptoms don't improve, call us back. Pt voiced understanding. [place]

## 2014-01-06 NOTE — Telephone Encounter (Signed)
Pt stated that the pharmacy wanted her to buy something over the counter, but she thought that Dr.Ferguson was going to call something in for her. I advised her that the Rx was sent to her pharmacy earlier today.

## 2014-01-09 ENCOUNTER — Other Ambulatory Visit: Payer: Self-pay | Admitting: Family Medicine

## 2014-01-11 ENCOUNTER — Telehealth: Payer: Self-pay | Admitting: *Deleted

## 2014-01-11 NOTE — Telephone Encounter (Signed)
Spoke with pt. Pt states she used AZO all weekend, but is still having pain in her right side. Pt's insurance didn't cover Pyridium. Pt wonders if you will prescribe Cipro. Please advise!! Thanks!! [place]

## 2014-01-12 IMAGING — US US PELVIS COMPLETE
1 series · 13 of 25 positions shown · non-contrast
Comparison: None

CLINICAL DATA: Postmenopausal female with cystic adnexal mass seen
on CT.

EXAM:
TRANSABDOMINAL AND TRANSVAGINAL ULTRASOUND OF PELVIS
TECHNIQUE: Both transabdominal and transvaginal ultrasound examinations of the
pelvis were performed. Transabdominal technique was performed for
global imaging of the pelvis including uterus, ovaries, adnexal
regions, and pelvic cul-de-sac. It was necessary to proceed with
endovaginal exam following the transabdominal exam to visualize the
ovaries and adnexae.

[Series 1: us pelvis complete · 0.24mm/px · 13 of 64 slices shown]
[im 1/64]
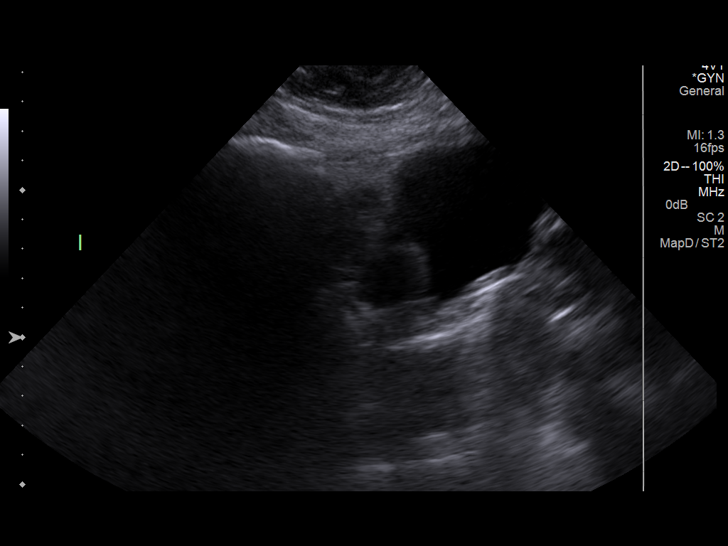
[im 6/64]
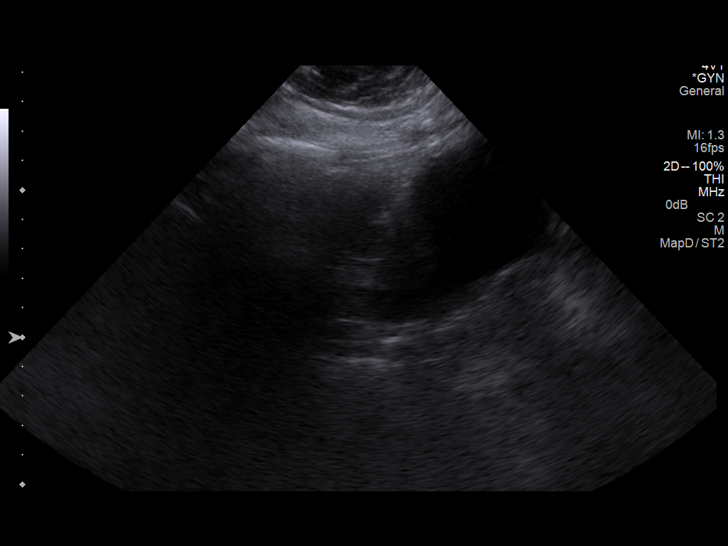
[im 11/64]
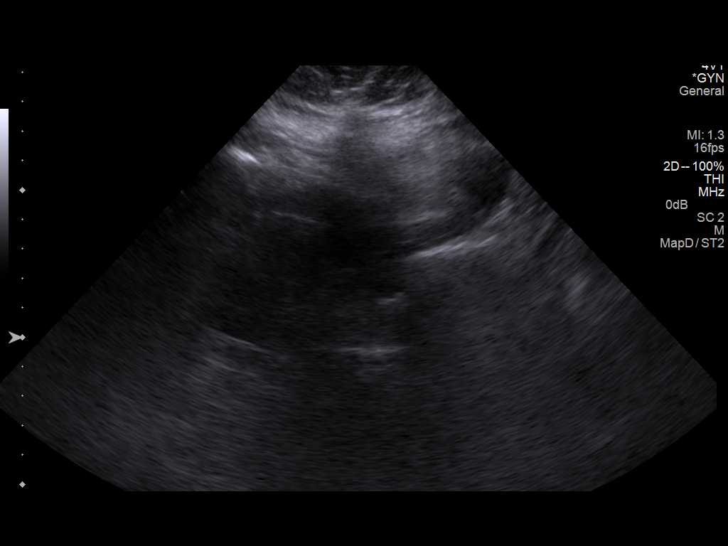
[im 16/64]
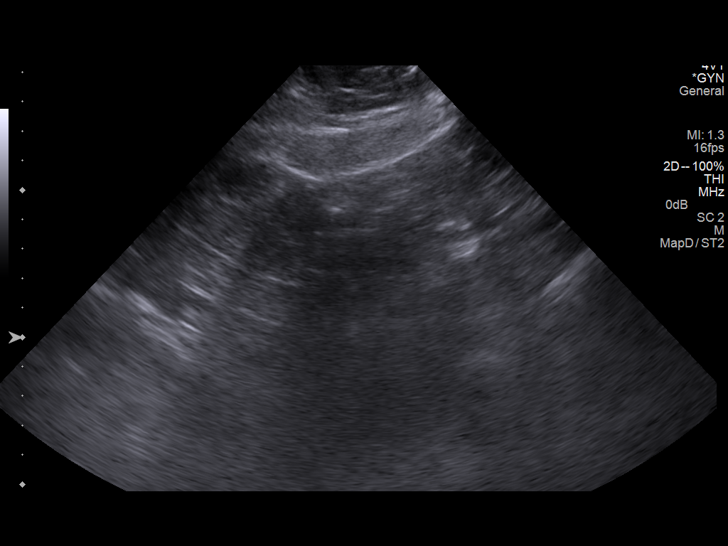
[im 22/64]
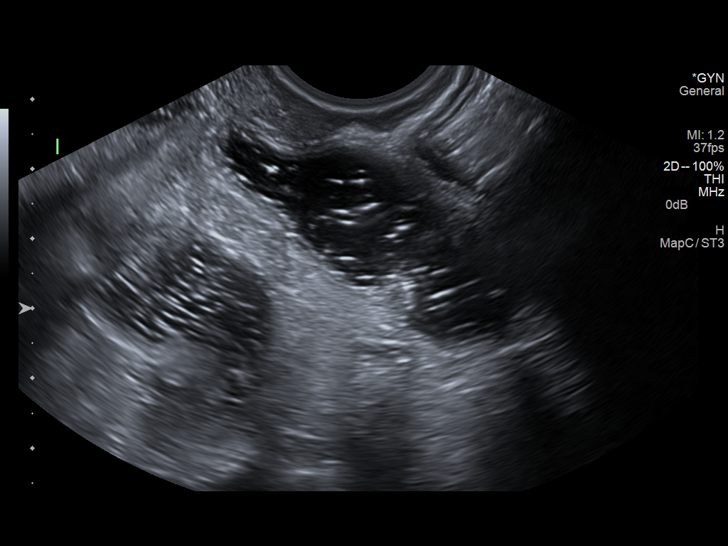
[im 27/64]
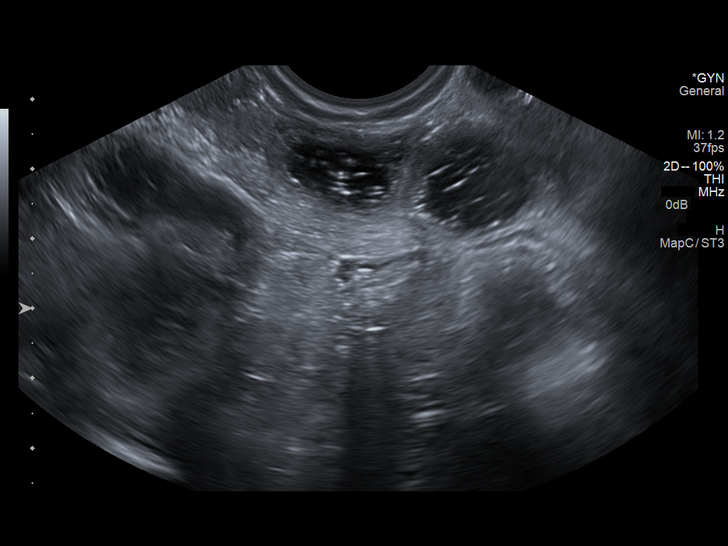
[im 32/64]
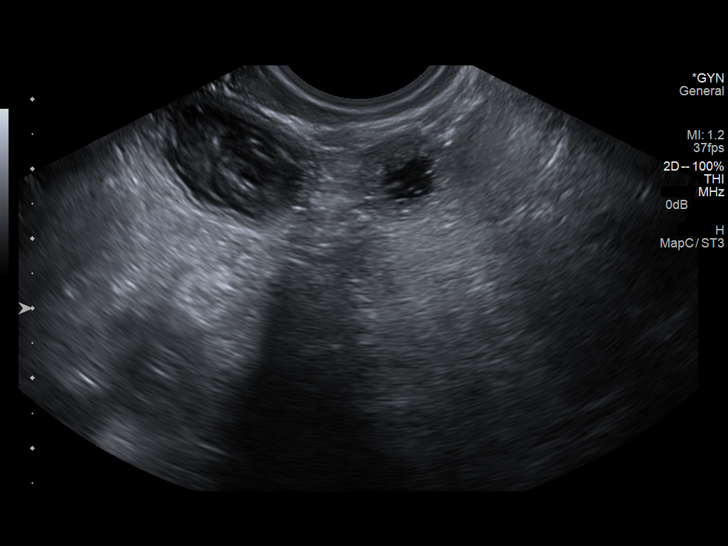
[im 37/64]
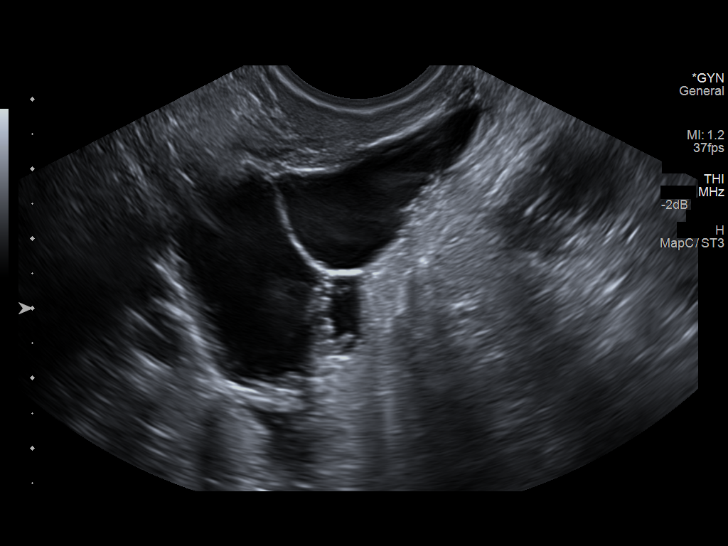
[im 43/64]
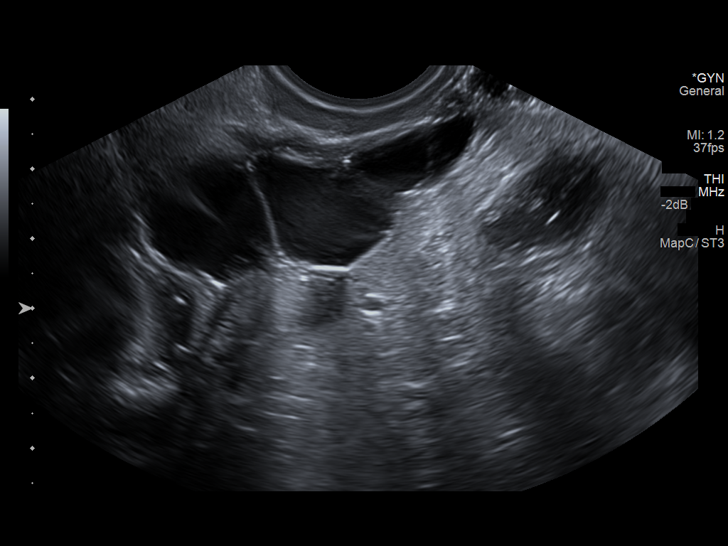
[im 48/64]
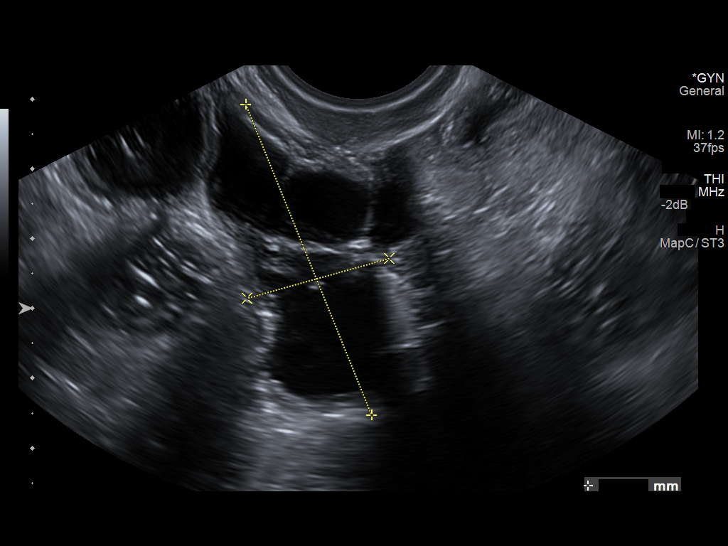
[im 53/64]
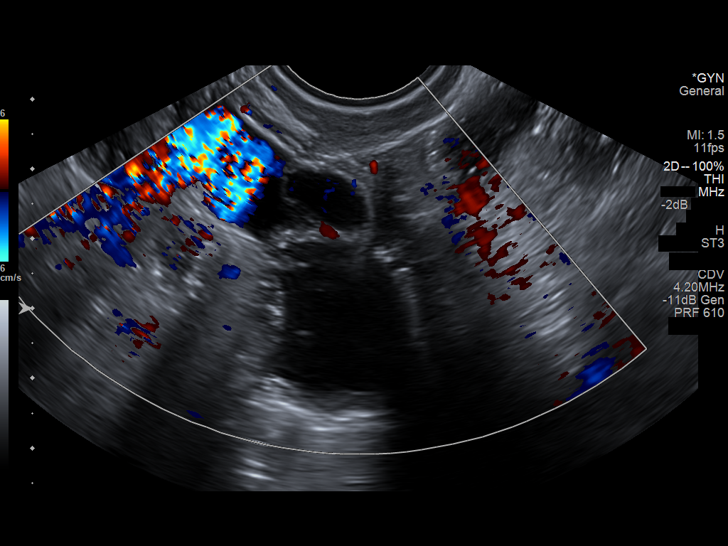
[im 58/64]
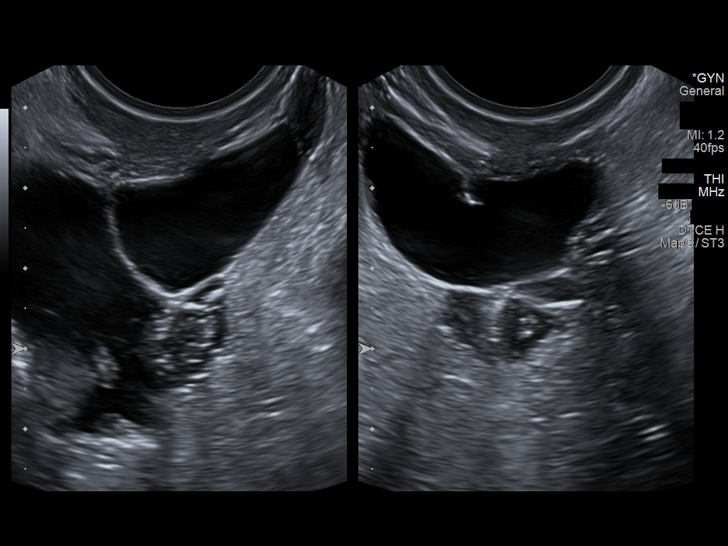
[im 64/64]
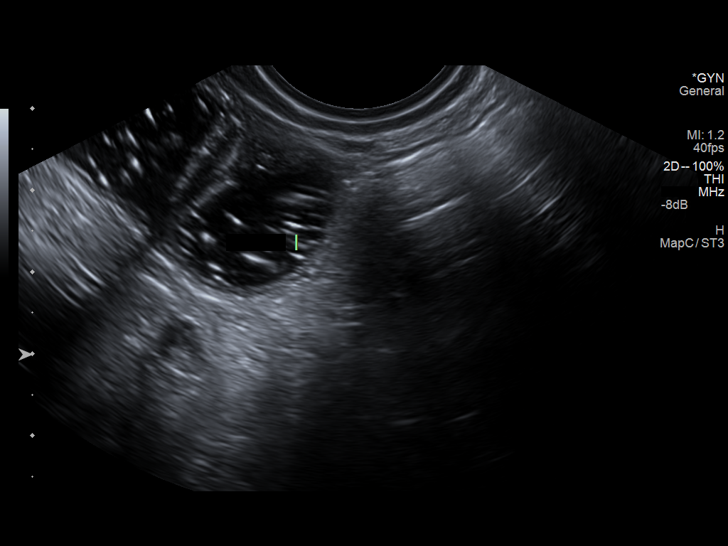

[13 of 25 positions shown; findings below may reference images not displayed]

FINDINGS: Uterus

Measurements: Surgically absent. Vaginal cuff is unremarkable in
appearance .

Endometrium

Thickness: Not applicable.

Right ovary

Measurements: No normal ovary visualized. A complex cystic lesion
with multiple thin septations is seen, which measures 4.6 by 2.0 x
3.0 cm. No thickened septations, mural nodules, or internal blood
flow seen on color Doppler ultrasound. This is suspicious for a
hydrosalpinx although a cystic ovarian neoplasm cannot definitely be
excluded.

Left ovary

Measurements: Not directly visualized by transabdominal or
transvaginal sonography, however no adnexal mass identified.

Other findings

No free fluid.
IMPRESSION: 4.6 cm complex cystic lesion in the right adnexa, which has
indeterminate but probably benign characteristics. Although a
hydrosalpinx is suspected, a cystic ovarian neoplasm cannot
definitely be excluded. Consider surgical evaluation or pelvic MRI
without and with contrast for further characterization. This
recommendation follows the consensus statement: Management of
Asymptomatic Ovarian and Other Adnexal Cysts Imaged at US: Society
of Radiologists in Ultrasound Consensus Conference Statement.

## 2014-01-12 IMAGING — CR DG RIBS W/ CHEST 3+V*R*
4 series · 4 of 4 positions shown · non-contrast
Comparison: Chest radiographs dated 01/28/2012 and more recent
chest CT examinations.

CLINICAL DATA: Right upper abdomen and rib pain for the past 5-6
days following an injury.

EXAM:
RIGHT RIBS AND CHEST - 3+ VIEW

[view not recorded (1 of 4)]
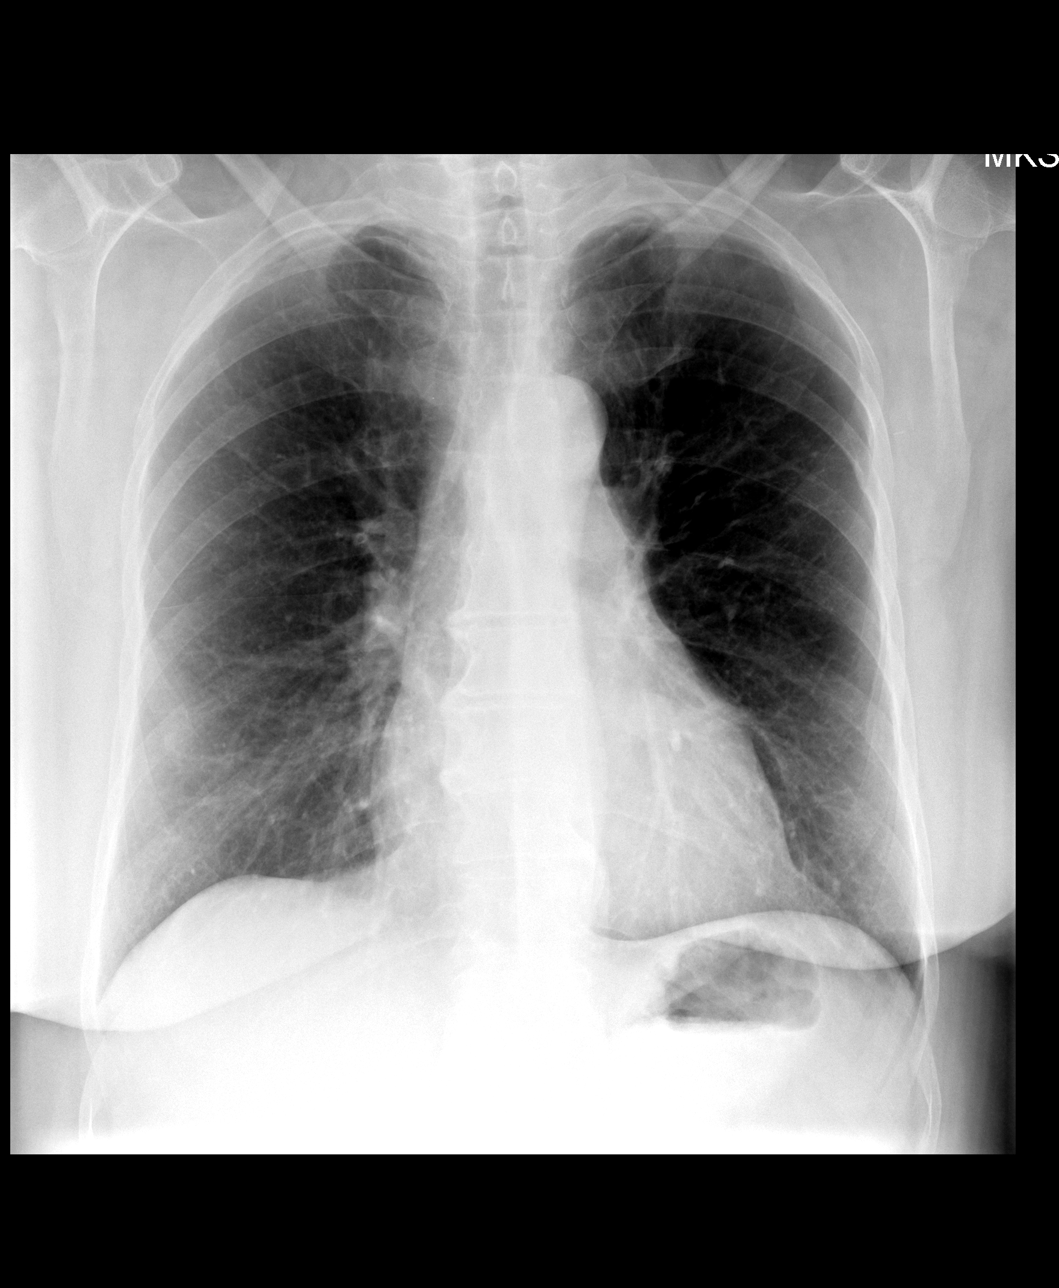

[view not recorded (2 of 4)]
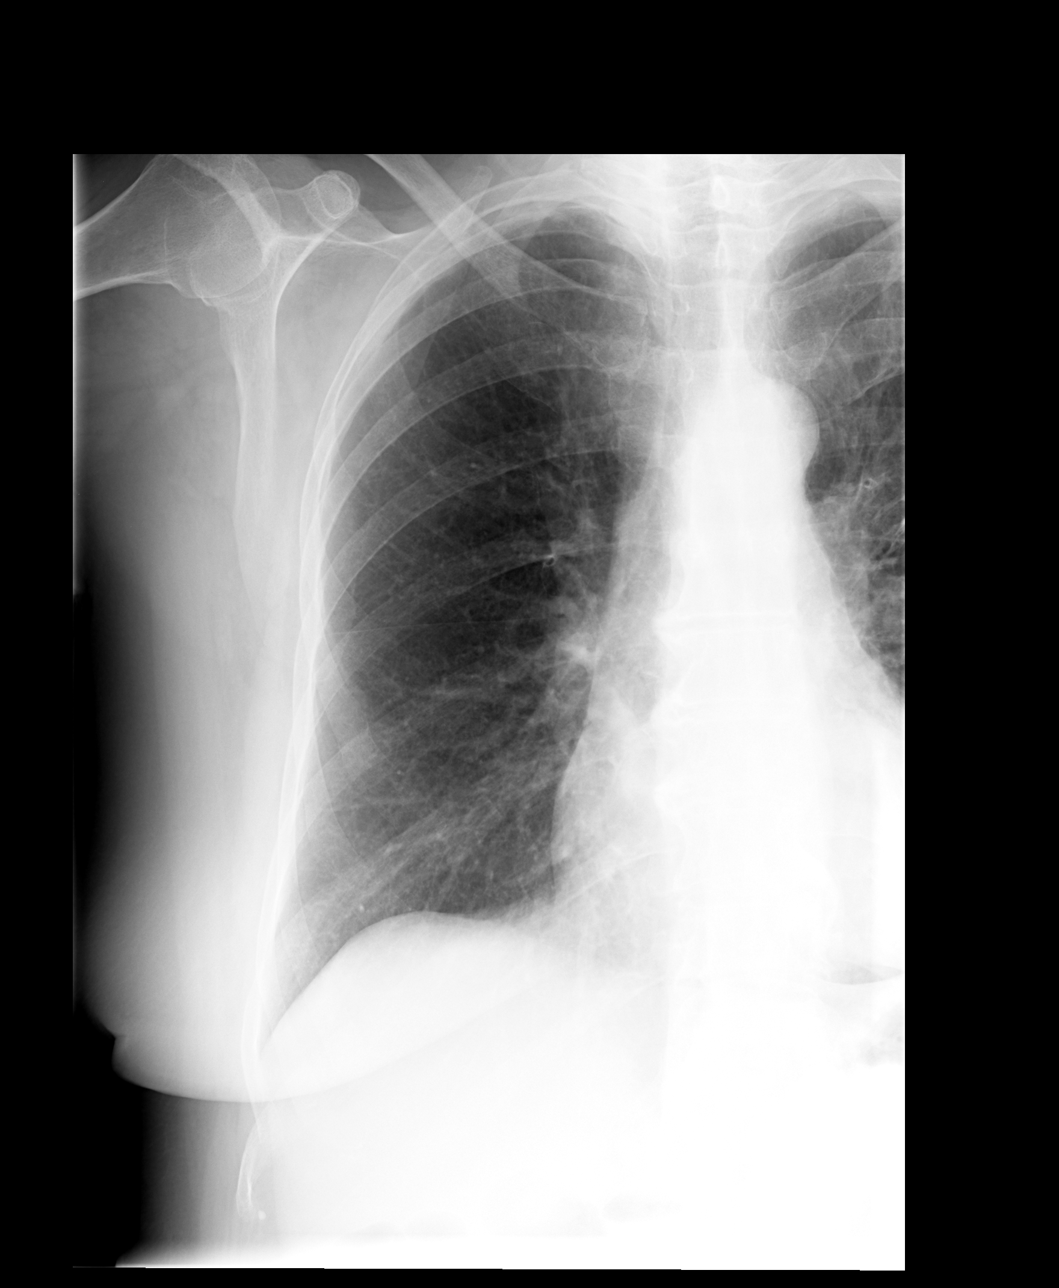

[view not recorded (3 of 4)]
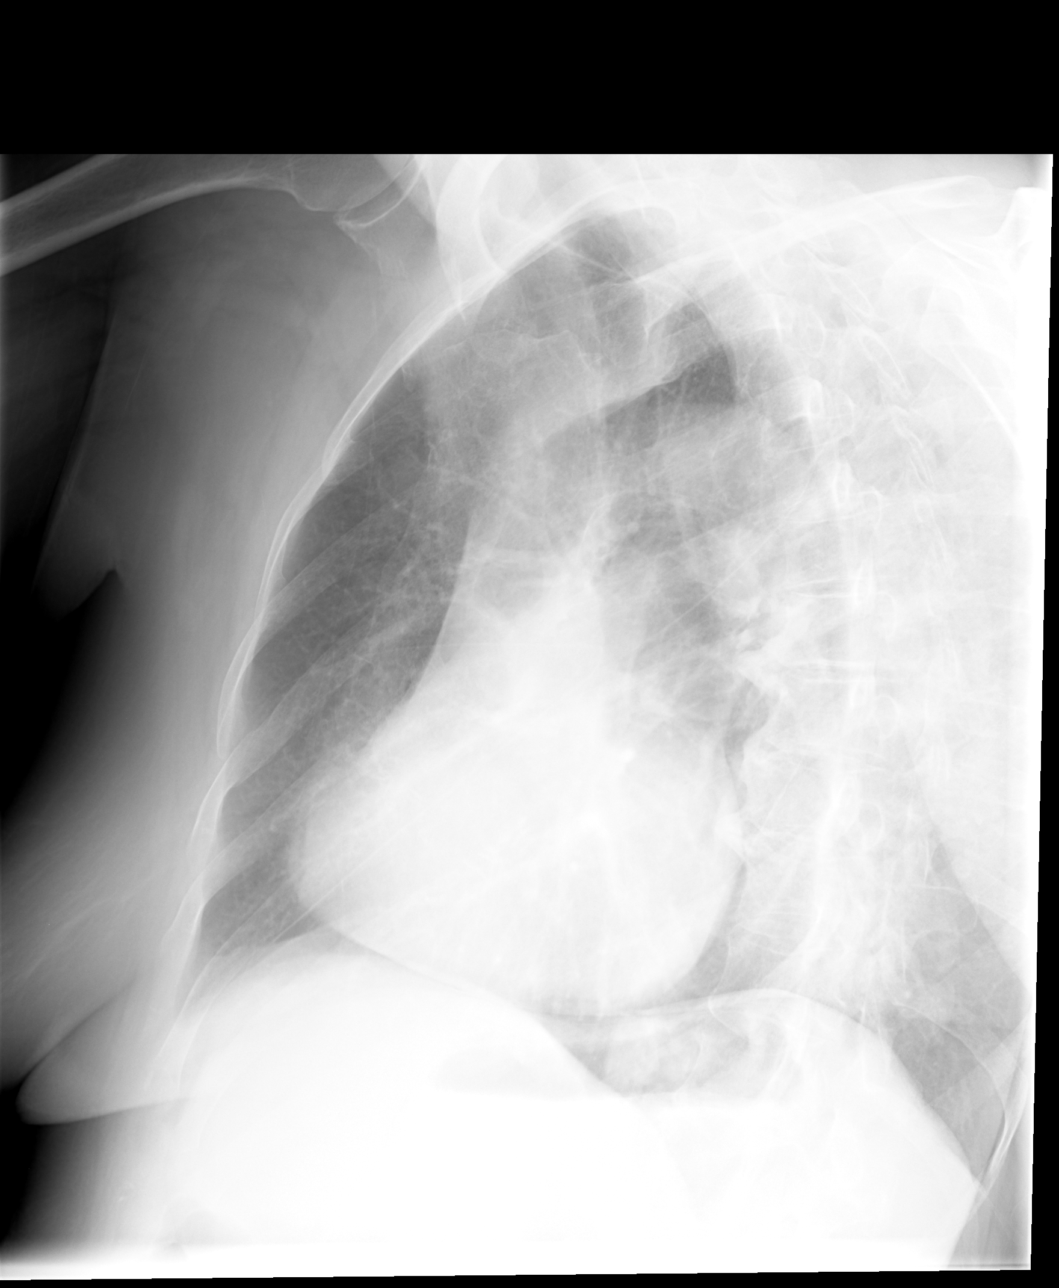

[view not recorded (4 of 4)]
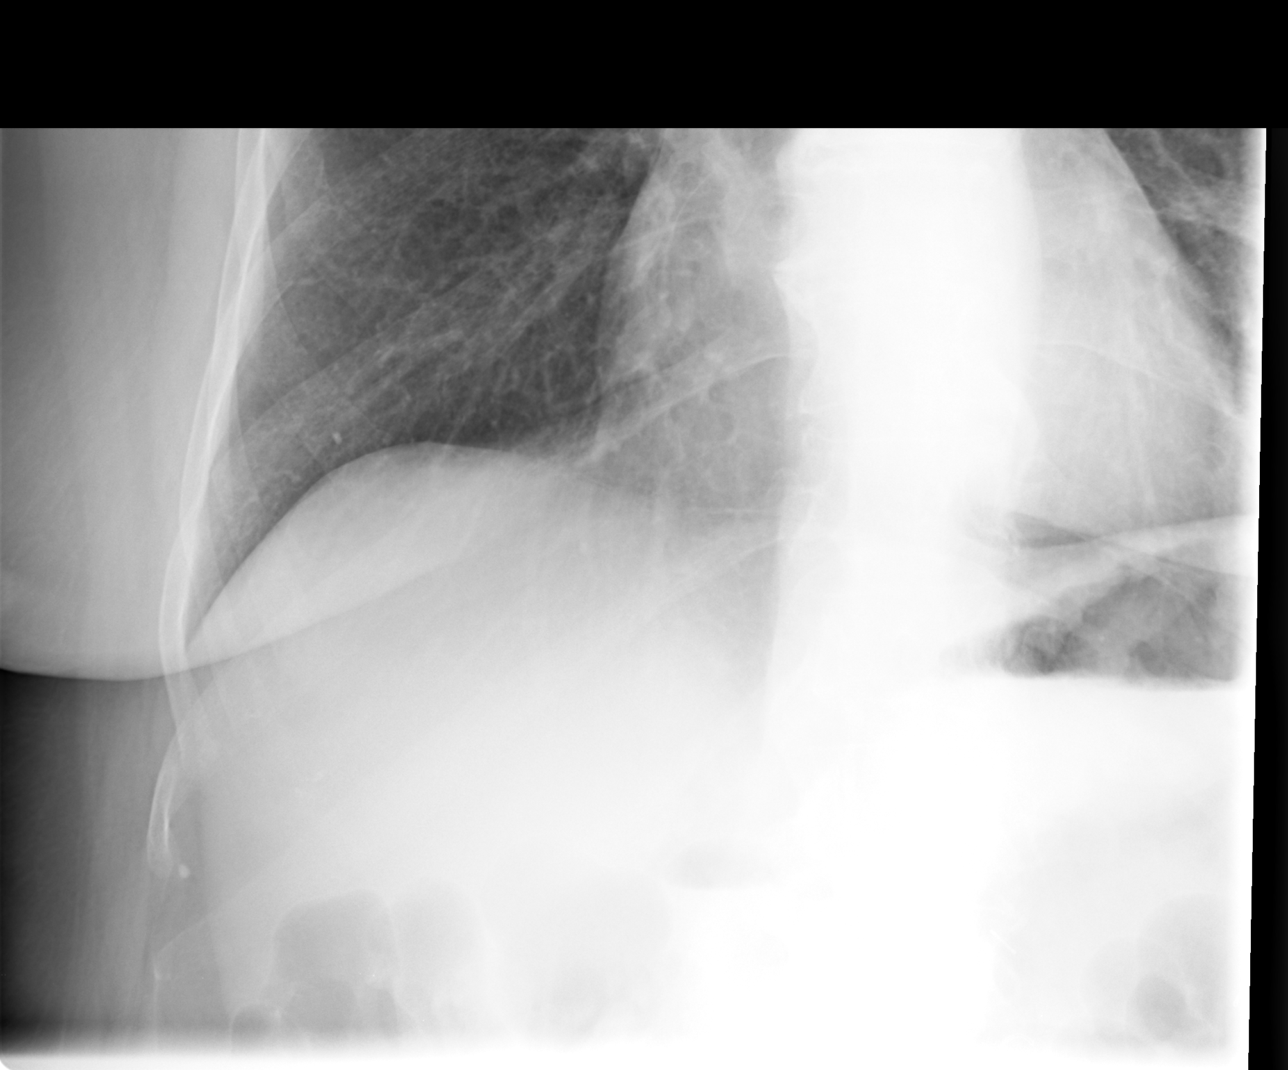

[4 of 4 positions shown; findings below may reference images not displayed]

FINDINGS: Borderline enlarged cardiac silhouette. The lungs remain
hyperexpanded with diffuse peribronchial thickening and prominent
interstitial markings. Old, healed right 5th rib fracture. No acute
fracture pneumothorax. Diffuse osteopenia. Thoracic spine
degenerative changes.
IMPRESSION: No acute abnormality. Stable changes of COPD and chronic bronchitis.

## 2014-01-14 NOTE — Telephone Encounter (Signed)
Left msg for pt to call office with status update.

## 2014-01-18 DIAGNOSIS — E119 Type 2 diabetes mellitus without complications: Secondary | ICD-10-CM | POA: Diagnosis not present

## 2014-01-18 DIAGNOSIS — E669 Obesity, unspecified: Secondary | ICD-10-CM | POA: Diagnosis not present

## 2014-01-19 LAB — COMPLETE METABOLIC PANEL WITH GFR
ALT: 16 U/L (ref 0–35)
AST: 16 U/L (ref 0–37)
Albumin: 4.5 g/dL (ref 3.5–5.2)
Alkaline Phosphatase: 58 U/L (ref 39–117)
BUN: 15 mg/dL (ref 6–23)
CO2: 28 meq/L (ref 19–32)
CREATININE: 0.71 mg/dL (ref 0.50–1.10)
Calcium: 10.2 mg/dL (ref 8.4–10.5)
Chloride: 104 mEq/L (ref 96–112)
GFR, Est African American: >89 mL/min
GFR, Est Non African American: 87 mL/min
Glucose, Bld: 83 mg/dL (ref 70–99)
Potassium: 4.9 mEq/L (ref 3.5–5.3)
Sodium: 139 mEq/L (ref 135–145)
Total Bilirubin: 0.4 mg/dL (ref 0.2–1.2)
Total Protein: 7.2 g/dL (ref 6.0–8.3)

## 2014-01-19 LAB — HEMOGLOBIN A1C
Hgb A1c MFr Bld: 6 % — ABNORMAL HIGH (ref <5.7)
Mean Plasma Glucose: 126 mg/dL — ABNORMAL HIGH (ref <117)

## 2014-01-20 ENCOUNTER — Other Ambulatory Visit: Payer: Self-pay | Admitting: Family Medicine

## 2014-01-21 ENCOUNTER — Encounter: Payer: Self-pay | Admitting: Family Medicine

## 2014-01-21 ENCOUNTER — Ambulatory Visit (INDEPENDENT_AMBULATORY_CARE_PROVIDER_SITE_OTHER): Payer: Medicare Other | Admitting: Family Medicine

## 2014-01-21 VITALS — BP 118/78 | HR 88 | Temp 98.4°F | Resp 16 | Ht 63.0 in | Wt 153.0 lb

## 2014-01-21 DIAGNOSIS — F17210 Nicotine dependence, cigarettes, uncomplicated: Secondary | ICD-10-CM

## 2014-01-21 DIAGNOSIS — E785 Hyperlipidemia, unspecified: Secondary | ICD-10-CM

## 2014-01-21 DIAGNOSIS — E119 Type 2 diabetes mellitus without complications: Secondary | ICD-10-CM

## 2014-01-21 DIAGNOSIS — E669 Obesity, unspecified: Secondary | ICD-10-CM

## 2014-01-21 DIAGNOSIS — J440 Chronic obstructive pulmonary disease with acute lower respiratory infection: Secondary | ICD-10-CM

## 2014-01-21 DIAGNOSIS — E1169 Type 2 diabetes mellitus with other specified complication: Secondary | ICD-10-CM

## 2014-01-21 DIAGNOSIS — F17200 Nicotine dependence, unspecified, uncomplicated: Secondary | ICD-10-CM

## 2014-01-21 DIAGNOSIS — I10 Essential (primary) hypertension: Secondary | ICD-10-CM | POA: Diagnosis not present

## 2014-01-21 MED ORDER — BENZONATATE 100 MG PO CAPS: 100.0000 mg | ORAL_CAPSULE | Freq: Two times a day (BID) | ORAL | Status: DC | PRN

## 2014-01-21 MED ORDER — DOXYCYCLINE HYCLATE 100 MG PO TABS: 100.0000 mg | ORAL_TABLET | Freq: Two times a day (BID) | ORAL | Status: DC

## 2014-01-21 NOTE — Progress Notes (Signed)
Subjective:    Patient ID: [name], female    DOB: 08/17/1945, 69 y.o.   MRN: [id number]  HPI The PT is here for follow up and re-evaluation of chronic medical conditions, medication management and review of any available recent lab and radiology data.  Preventive health is updated, specifically  Cancer screening and Immunization.   Questions or concerns regarding consultations or procedures which the PT has had in the interim are  addressed. The PT denies any adverse reactions to current medications since the last visit.  2 week h/o cough productive of yellow sputum   Review of Systems See HPI Denies recent fever or chills. Denies sinus pressure, nasal congestion, ear pain or sore throat. Denies chest pains, palpitations and leg swelling Denies abdominal pain, nausea, vomiting,diarrhea or constipation.   Denies dysuria, frequency, hesitancy or incontinence. Denies  Uncontrolled joint pain, swelling and limitation in mobility. Denies headaches, seizures, numbness, or tingling. Denies depression, anxiety or insomnia. Denies skin break down or rash.        Objective:   Physical Exam   BP 118/78 mmHg  Pulse 88  Temp(Src) 98.4 F (36.9 C) (Oral)  Resp 16  Ht 5\' 3"  (1.6 m)  Wt 153 lb (69.4 kg)  BMI 27.11 kg/m2  SpO2 95%  Patient alert and oriented and in no cardiopulmonary distress.  HEENT: No facial asymmetry, EOMI,   oropharynx pink and moist.  Neck supple no JVD, no mass.  Chest: decreased air entry, bilateral crackles and wheezes  CVS: S1, S2 no murmurs, no S3.Regular rate.  ABD: Soft non tender.   Ext: No edema  MS: Adequate though reduced  ROM spine, shoulders, hips and knees.  Skin: Intact, no ulcerations or rash noted.  Psych: Good eye contact, normal affect. Memory intact not anxious or depressed appearing.  CNS: CN 2-12 intact, power,  normal throughout.no focal deficits noted.        Assessment & Plan:  Bronchitis, chronic obstructive  w acute bronchitis Antibiotic and decongestant prescribed.    COPD Deteriorating due to ongoing nicotine use, pt however is now motivated and trying to quit  Diabetes mellitus type 2 in obese Controlled, no change in medication Patient educated about the importance of limiting  Carbohydrate intake , the need to commit to daily physical activity for a minimum of 30 minutes , and to commit weight loss.  Diabetic Labs Latest Ref Rng 05/21/2014 05/05/2014 01/18/2014 09/25/2013 09/11/2013  HbA1c <5.7 % - - 6.0(H) - -  Microalbumin <2.0 mg/dL - 4.2(H) - - -  Micro/Creat Ratio 0.0 - 30.0 mg/g - 84.3(H) - - -  Chol 0 - 200 mg/dL - - - 159 -  HDL >39 mg/dL - - - 54 -  Calc LDL 0 - 99 mg/dL - - - 66 -  Triglycerides <150 mg/dL - - - 194(H) -  Creatinine 0.44 - 1.00 mg/dL 0.76 - 0.71 0.73 0.83   BP/Weight 05/26/2014 05/21/2014 05/05/2014 05/02/2014 04/05/2014 02/03/1476 02/24/5619  Systolic BP 308 657 846 962 952 841 324  Diastolic BP 48 68 80 66 58 85 78  Wt. (Lbs) 161 161 168 158 160.2 140 153  BMI 26.79 26.79 27.96 26.29 26.66 23.3 27.11   Foot/eye exam completion dates Latest Ref Rng 06/10/2013 04/21/2013  Eye Exam No Retinopathy - No Retinopathy  Foot Form Completion - Done -       Hyperlipidemia LDL goal <100 UnControlled, no change in medication, elevated TG  Updated lab needed at/ before  next visit.   Lipid Panel  Lab Results  Component Value Date   CHOL 159 09/25/2013   HDL 54 09/25/2013   LDLCALC 66 09/25/2013   TRIG 194* 09/25/2013   CHOLHDL 2.9 09/25/2013        NICOTINE ADDICTION Improving , trying to quit Patient counseled for approximately 5 minutes regarding the health risks of ongoing nicotine use, specifically all types of cancer, heart disease, stroke and respiratory failure. The options available for help with cessation ,the behavioral changes to assist the process, and the option to either gradully reduce usage  Or abruptly stop.is also discussed. Pt is also  encouraged to set specific goals in number of cigarettes used daily, as well as to set a quit date.  Number of cigarettes/cigars currently smoking daily: 3

## 2014-01-21 NOTE — Patient Instructions (Addendum)
Annual wellness in 4 month, call if you need me before  Two meds are prescribed for bronchitis  Blood pressure and blood sugar are excellent  Do not give up on trying to stop smoking    Fasting lipid, cmp, hBa1C in 4 month  Call if you want to see bladder specialist for podiatrist we will refer you  All the best for 2016!

## 2014-02-01 ENCOUNTER — Ambulatory Visit: Payer: Medicare Other

## 2014-02-01 ENCOUNTER — Ambulatory Visit (INDEPENDENT_AMBULATORY_CARE_PROVIDER_SITE_OTHER): Payer: Medicare Other

## 2014-02-01 DIAGNOSIS — R109 Unspecified abdominal pain: Secondary | ICD-10-CM

## 2014-02-01 LAB — POCT URINALYSIS DIPSTICK
BILIRUBIN UA: NEGATIVE
Blood, UA: NEGATIVE
Glucose, UA: NEGATIVE
Ketones, UA: NEGATIVE
Leukocytes, UA: NEGATIVE
Nitrite, UA: NEGATIVE
PH UA: 7
Protein, UA: NEGATIVE
Spec Grav, UA: 1.015
UROBILINOGEN UA: 0.2

## 2014-02-01 NOTE — Progress Notes (Signed)
Patient states she has been having flank pain and some urinary pressure for a few days. Urinalysis in office was normal. Will make her aware

## 2014-02-02 ENCOUNTER — Other Ambulatory Visit: Payer: Self-pay | Admitting: Family Medicine

## 2014-02-02 ENCOUNTER — Telehealth: Payer: Self-pay | Admitting: Family Medicine

## 2014-02-02 DIAGNOSIS — N200 Calculus of kidney: Secondary | ICD-10-CM

## 2014-02-02 DIAGNOSIS — R109 Unspecified abdominal pain: Secondary | ICD-10-CM

## 2014-02-02 NOTE — Telephone Encounter (Signed)
She does have stones in rigth kidney from ct scan from 08/2013, opls refeer her [facility] [place], flank pioan, kidney stones and cystitis, I woill sign, let her know

## 2014-02-02 NOTE — Telephone Encounter (Signed)
Wants referral to a urologist for flank pain (states she thinks she has stones) Recent UA was negative in office

## 2014-02-03 NOTE — Addendum Note (Signed)
Addended by: Eual Fines on: 02/03/2014 08:13 AM   Modules accepted: Orders

## 2014-02-03 NOTE — Telephone Encounter (Signed)
Pt referred, she is aware

## 2014-02-04 ENCOUNTER — Ambulatory Visit (HOSPITAL_COMMUNITY)
Admission: RE | Admit: 2014-02-04 | Discharge: 2014-02-04 | Disposition: A | Discharge status: Home / Self Care | Payer: Medicare Other | Source: Ambulatory Visit | Attending: Oncology | Admitting: Oncology

## 2014-02-04 DIAGNOSIS — Z1231 Encounter for screening mammogram for malignant neoplasm of breast: Secondary | ICD-10-CM | POA: Diagnosis present

## 2014-02-09 ENCOUNTER — Telehealth: Payer: Self-pay | Admitting: *Deleted

## 2014-02-09 NOTE — Telephone Encounter (Signed)
Pt called requesting a referral to the bladder specialist and to the foot doctor also, pt states she see's Luna Glasgow but he does not "mess" with her feet, she has a appt with him 03/11/14 1:30. Please advise [phone]

## 2014-02-09 NOTE — Telephone Encounter (Signed)
Referrals already entered

## 2014-02-11 ENCOUNTER — Telehealth: Payer: Self-pay | Admitting: Family Medicine

## 2014-02-11 DIAGNOSIS — K8020 Calculus of gallbladder without cholecystitis without obstruction: Secondary | ICD-10-CM

## 2014-02-11 NOTE — Telephone Encounter (Signed)
pls explain to pt that her nausea and abdominal pain is from a gallstone and she needs to see a gen surgeon about this It is true that she does have a kidney stone as well, but per the the urologist she needs the gallstone addressed  As this is the likely cause of abdominal / flank pain  Pls refer her to Dr Arnoldo Morale surgeon of choice I will sign  Unless her kidney stone is causing a lot of pain , no need to go to urology at thsi time, and that amt of pain from kidney stone woould send her to Ed so focus on gallstone

## 2014-02-11 NOTE — Telephone Encounter (Signed)
Would you like to refer to gen surgeon

## 2014-02-12 ENCOUNTER — Emergency Department (HOSPITAL_COMMUNITY)
Admission: EM | Admit: 2014-02-12 | Discharge: 2014-02-12 | Disposition: A | Discharge status: Home / Self Care | Payer: Medicare Other | Attending: Emergency Medicine | Admitting: Emergency Medicine

## 2014-02-12 ENCOUNTER — Emergency Department (HOSPITAL_COMMUNITY): Payer: Medicare Other

## 2014-02-12 ENCOUNTER — Encounter (HOSPITAL_COMMUNITY): Payer: Self-pay

## 2014-02-12 DIAGNOSIS — Y9289 Other specified places as the place of occurrence of the external cause: Secondary | ICD-10-CM | POA: Diagnosis not present

## 2014-02-12 DIAGNOSIS — S8001XA Contusion of right knee, initial encounter: Secondary | ICD-10-CM | POA: Diagnosis not present

## 2014-02-12 DIAGNOSIS — J441 Chronic obstructive pulmonary disease with (acute) exacerbation: Secondary | ICD-10-CM | POA: Insufficient documentation

## 2014-02-12 DIAGNOSIS — Y998 Other external cause status: Secondary | ICD-10-CM | POA: Diagnosis not present

## 2014-02-12 DIAGNOSIS — F17210 Nicotine dependence, cigarettes, uncomplicated: Secondary | ICD-10-CM | POA: Diagnosis not present

## 2014-02-12 DIAGNOSIS — Z880 Allergy status to penicillin: Secondary | ICD-10-CM | POA: Insufficient documentation

## 2014-02-12 DIAGNOSIS — Y9389 Activity, other specified: Secondary | ICD-10-CM | POA: Diagnosis not present

## 2014-02-12 DIAGNOSIS — Z87442 Personal history of urinary calculi: Secondary | ICD-10-CM | POA: Insufficient documentation

## 2014-02-12 DIAGNOSIS — Z792 Long term (current) use of antibiotics: Secondary | ICD-10-CM | POA: Diagnosis not present

## 2014-02-12 DIAGNOSIS — Z79899 Other long term (current) drug therapy: Secondary | ICD-10-CM | POA: Diagnosis not present

## 2014-02-12 DIAGNOSIS — J40 Bronchitis, not specified as acute or chronic: Secondary | ICD-10-CM

## 2014-02-12 DIAGNOSIS — Z7952 Long term (current) use of systemic steroids: Secondary | ICD-10-CM | POA: Diagnosis not present

## 2014-02-12 DIAGNOSIS — J9809 Other diseases of bronchus, not elsewhere classified: Secondary | ICD-10-CM | POA: Diagnosis not present

## 2014-02-12 DIAGNOSIS — E785 Hyperlipidemia, unspecified: Secondary | ICD-10-CM | POA: Diagnosis not present

## 2014-02-12 DIAGNOSIS — M25561 Pain in right knee: Secondary | ICD-10-CM | POA: Diagnosis not present

## 2014-02-12 DIAGNOSIS — S8991XA Unspecified injury of right lower leg, initial encounter: Secondary | ICD-10-CM | POA: Diagnosis present

## 2014-02-12 DIAGNOSIS — Z720 Tobacco use: Secondary | ICD-10-CM | POA: Insufficient documentation

## 2014-02-12 DIAGNOSIS — Z853 Personal history of malignant neoplasm of breast: Secondary | ICD-10-CM | POA: Insufficient documentation

## 2014-02-12 DIAGNOSIS — I10 Essential (primary) hypertension: Secondary | ICD-10-CM | POA: Diagnosis not present

## 2014-02-12 DIAGNOSIS — W228XXA Striking against or struck by other objects, initial encounter: Secondary | ICD-10-CM | POA: Insufficient documentation

## 2014-02-12 DIAGNOSIS — M1990 Unspecified osteoarthritis, unspecified site: Secondary | ICD-10-CM | POA: Diagnosis not present

## 2014-02-12 DIAGNOSIS — E118 Type 2 diabetes mellitus with unspecified complications: Secondary | ICD-10-CM | POA: Diagnosis not present

## 2014-02-12 MED ORDER — HYDROCOD POLST-CHLORPHEN POLST 10-8 MG/5ML PO LQCR: 5.0000 mL | Freq: Two times a day (BID) | ORAL | Status: DC | PRN

## 2014-02-12 MED ORDER — LEVALBUTEROL HCL 0.63 MG/3ML IN NEBU
0.6300 mg | INHALATION_SOLUTION | Freq: Once | RESPIRATORY_TRACT | Status: AC
Start: 2014-02-12 — End: 2014-02-12
  Administered 2014-02-12: 0.63 mg via RESPIRATORY_TRACT
  Filled 2014-02-12: qty 3

## 2014-02-12 MED ORDER — LEVOFLOXACIN 750 MG PO TABS: 750.0000 mg | ORAL_TABLET | Freq: Every day | ORAL | Status: DC

## 2014-02-12 MED ORDER — LORAZEPAM 1 MG PO TABS
1.0000 mg | ORAL_TABLET | Freq: Once | ORAL | Status: AC
  Administered 2014-02-12: 1 mg via ORAL
  Filled 2014-02-12: qty 1

## 2014-02-12 MED ORDER — ALBUTEROL SULFATE (2.5 MG/3ML) 0.083% IN NEBU
5.0000 mg | INHALATION_SOLUTION | Freq: Once | RESPIRATORY_TRACT | Status: DC
  Filled 2014-02-12: qty 6

## 2014-02-12 NOTE — ED Provider Notes (Signed)
CSN: [id number]     Arrival date & time 02/12/14  1354 History   First MD Initiated Contact with Patient 02/12/14 1405     Chief Complaint  Patient presents with  . Knee Pain  . Cough     (Consider location/radiation/quality/duration/timing/severity/associated sxs/prior Treatment) HPI Comments: Pt is a 69 y/o female she has a known history of lung disease with years of smoking, hypertension on an ACE inhibitor, also takes a baby aspirin every day. She states that she has had a persistent cough, has recently been treated for bronchitis with an antibiotic but states her symptoms are persisting, she denies fevers chills nausea or vomiting but states that she does have some shortness of breath especially with deep breathing and exertion. She has no orthopnea, no swelling of the lower extremities, does have occasional purulent sputum.  Patient is a 69 y.o. female presenting with knee pain and cough. The history is provided by the patient.  Knee Pain Cough   Past Medical History  Diagnosis Date  . Hypertension   . Breast cancer, left breast   . Nicotine dependence   . COPD (chronic obstructive pulmonary disease)   . Osteoarthritis   . Kidney stones   . Diverticula of colon   . Hyperlipidemia   . Breast cancer 03/11/2012    Stage I (T1b N0 M0), grade 1 well-differentiated carcinoma of the left breast status, post lumpectomy followed by radiation therapy. Her estrogen receptor receptors were 93%, progesterone receptors 67%. HER-2/neu was negative. No lymphovascular space invasion was seen. All margins were clear. Ki-67 marker was low at 1% with surgery on 11/15/2004. Treated then with post-lumpectomy radiation, finish  . Diabetes mellitus without complication    Past Surgical History  Procedure Laterality Date  . Abdominal hysterectomy    . Tubal ligation    . Left breast       cancer, in 2006  . Colectomy      2005, diverticulitis  . Breast surgery  2005 approx    left lumpectomy    . Dilation and curettage of uterus     Family History  Problem Relation Age of Onset  . Cancer Mother     breast  . COPD Father   . Cancer Sister     cancer   History  Substance Use Topics  . Smoking status: Current Every Day Smoker -- 0.50 packs/day for 50 years    Types: Cigarettes  . Smokeless tobacco: Current User     Comment: Using E-Cig  . Alcohol Use: No   OB History    Gravida Para Term Preterm AB TAB SAB Ectopic Multiple Living   _0 Review of Systems  Respiratory: Positive for cough.   All other systems reviewed and are negative.     Allergies  Penicillins and Sulfonamide derivatives  Home Medications   Prior to Admission medications   Medication Sig Start Date End Date Taking? Authorizing Provider  acetaminophen (TYLENOL) 500 MG tablet Take 500 mg by mouth every 6 (six) hours as needed for moderate pain.   Yes Historical Provider, MD  amLODipine (NORVASC) 5 MG tablet TAKE 1 TABLET BY MOUTH ONCE A DAY. 01/20/14  Yes [name], MD  aspirin EC 81 MG tablet Take 81 mg by mouth daily.   Yes Historical Provider, MD  ATROVENT HFA 17 MCG/ACT inhaler INHALE 2 PUFFS BY MOUTH FOUR TIMES A DAY. 12/15/13  Yes Norwood Levo  Moshe Cipro, MD  benazepril (LOTENSIN) 40 MG tablet TAKE ONE TABLET BY MOUTH DAILY. 01/20/14  Yes [name], MD  Calcium Carbonate-Vitamin D (CALCIUM 600 + D PO) Take 1 tablet by mouth 2 (two) times daily.    Yes Historical Provider, MD  fluticasone (FLONASE) 50 MCG/ACT nasal spray Place 2 sprays into both nostrils daily.   Yes Historical Provider, MD  HYDROcodone-acetaminophen (NORCO) 7.5-325 MG per tablet Take 1 tablet by mouth every 6 (six) hours as needed for moderate pain or severe pain. Patient taking differently: Take 1 tablet by mouth every 4 (four) hours as needed for moderate pain or severe pain.  09/11/13  Yes Ripudeep Krystal Eaton, MD  loratadine (CLARITIN) 10 MG tablet TAKE 1 TABLET BY MOUTH ONCE DAILY FOR ALLERGIES. 10/23/13   Yes [name], MD  metFORMIN (GLUCOPHAGE) 500 MG tablet TAKE 1 TABLET BY MOUTH DAILY WITH BREAKFAST. 01/20/14  Yes [name], MD  Omega-3 Fatty Acids (FISH OIL) 1000 MG CAPS Take 2 capsules by mouth daily.   Yes Historical Provider, MD  polyethylene glycol powder (GLYCOLAX/MIRALAX) powder MIX 1 CAPFUL IN 8 OUNCES OF JUICE OR WATER AND DRINK ONCE DAILY. 12/17/13  Yes [name], MD  potassium chloride SA (K-DUR,KLOR-CON) 20 MEQ tablet TAKE 1 TABLET BY MOUTH DAILY. 01/20/14  Yes [name], MD  simvastatin (ZOCOR) 20 MG tablet TAKE (1) TABLET BY MOUTH DAILY AT 6 PM FOR CHOLESTEROL. 01/11/14  Yes [name], MD  tiZANidine (ZANAFLEX) 4 MG capsule Take 4 mg by mouth every 8 (eight) hours as needed for muscle spasms.   Yes Historical Provider, MD  albuterol (PROVENTIL) (2.5 MG/3ML) 0.083% nebulizer solution USE 1 VIAL IN NEBULIZER 3 TIMES DAILY. 08/11/13   [name], MD  benzonatate (TESSALON) 100 MG capsule Take 1 capsule (100 mg total) by mouth 2 (two) times daily as needed for cough. 01/21/14   [name], MD  chlorpheniramine-HYDROcodone [facility] PENNKINETIC ER) 10-8 MG/5ML LQCR Take 5 mLs by mouth every 12 (twelve) hours as needed for cough (Cough). 02/12/14   Johnna Acosta, MD  docusate sodium 100 MG CAPS Take 100 mg by mouth 2 (two) times daily. 09/11/13   Ripudeep Krystal Eaton, MD  doxycycline (VIBRA-TABS) 100 MG tablet Take 1 tablet (100 mg total) by mouth 2 (two) times daily. Patient not taking: Reported on 02/12/2014 01/21/14   [name], MD  FLUoxetine (PROZAC) 10 MG capsule Take 1 capsule (10 mg total) by mouth daily. Patient not taking: Reported on 01/21/2014 10/05/13   [name], MD  levofloxacin (LEVAQUIN) 750 MG tablet Take 1 tablet (750 mg total) by mouth daily. 02/12/14   Johnna Acosta, MD  phenazopyridine (PYRIDIUM) 200 MG tablet Take 1 tablet (200 mg total) by mouth 3 (three) times daily as needed for pain. Patient not taking:  Reported on 01/21/2014 01/06/14   Jonnie Kind, MD  promethazine (PHENERGAN) 12.5 MG tablet Take 1 tablet (12.5 mg total) by mouth every 8 (eight) hours as needed for nausea or vomiting. 09/15/13   [name], MD   BP 145/74 mmHg  Pulse 112  Temp(Src) 98 F (36.7 C) (Oral)  Resp 16  Ht $R'5\' 5"'Wt$  (1.651 m)  Wt 140 lb (63.504 kg)  BMI 23.30 kg/m2  SpO2 96% Physical Exam  Constitutional: She appears well-developed and well-nourished. No distress.  HENT:  Head: Normocephalic and atraumatic.  Mouth/Throat: Oropharynx is clear and moist. No oropharyngeal exudate.  Eyes: Conjunctivae  and EOM are normal. Pupils are equal, round, and reactive to light. Right eye exhibits no discharge. Left eye exhibits no discharge. No scleral icterus.  Neck: Normal range of motion. Neck supple. No JVD present. No thyromegaly present.  Cardiovascular: Normal rate, regular rhythm, normal heart sounds and intact distal pulses.  Exam reveals no gallop and no friction rub.   No murmur heard. No tachycardia on my exam, strong pulses at the radial arteries, no JVD, no peripheral edema  Pulmonary/Chest: Effort normal. No respiratory distress. She has wheezes (end expiratory wheezing, speaks in full sentences). She has no rales.  Abdominal: Soft. Bowel sounds are normal. She exhibits no distension and no mass. There is no tenderness.  Musculoskeletal: Normal range of motion. She exhibits no edema or tenderness.  Lymphadenopathy:    She has no cervical adenopathy.  Neurological: She is alert. Coordination normal.  Skin: Skin is warm and dry. No rash noted. No erythema.  Psychiatric: She has a normal mood and affect. Her behavior is normal.  Nursing note and vitals reviewed.   ED Course  Procedures (including critical care time) Labs Review Labs Reviewed - No data to display  Imaging Review Dg Chest 2 View  02/12/2014   CLINICAL DATA:  69 year old female with productive cough for 1 week.  EXAM: CHEST  2 VIEW   COMPARISON:  Chest x-ray 08/03/2013.  FINDINGS: Mild diffuse peribronchial cuffing. Lung volumes are normal. No consolidative airspace disease. No pleural effusions. No pneumothorax. No pulmonary nodule or mass noted. Pulmonary vasculature and the cardiomediastinal silhouette are within normal limits. Atherosclerosis in the thoracic aorta.  IMPRESSION: 1. Mild diffuse peribronchial cuffing suggestive of an acute bronchitis.   Electronically Signed   By: Vinnie Langton M.D.   On: 02/12/2014 15:36   Dg Knee Complete 4 Views Right  02/12/2014   CLINICAL DATA:  Acute right knee pain after sliding on mud today. Initial encounter.  EXAM: RIGHT KNEE - COMPLETE 4+ VIEW  COMPARISON:  None.  FINDINGS: There is no evidence of fracture, dislocation, or joint effusion. Joint spaces are intact. Mild spurring is seen arising from superior aspect of the patella. Soft tissues are unremarkable.  IMPRESSION: No acute abnormality seen in the right knee.   Electronically Signed   By: Sabino Dick M.D.   On: 02/12/2014 15:40    ED ECG REPORT  I personally interpreted this EKG   Date: 02/12/2014   Rate: 107  Rhythm: sinus tachycardia  QRS Axis: normal  Intervals: normal  ST/T Wave abnormalities: normal  Conduction Disutrbances:none  Narrative Interpretation:   Old EKG Reviewed: unchanged form 8/15   MDM   Final diagnoses:  Bronchitis  Contusion of right knee, initial encounter    The patient appears benign, she has full range of motion of both of her knees, she does complain of pain in the right knee secondary to a minor injury where she bumped the car door with her knee. We'll obtain a chest x-ray to rule out pneumonia or pneumothorax, could possibly be ACE inhibitor related cough, consider bronchitis and infection though she is afebrile and has normal oxygenation.  Pt has improved with meds - xray seen and interpreted by myself - no signs of fracture of the knee and no signs of pneumonia - pt given neb,  ativan (at request to help tolerate the tremor of the bronchodilator).  Has no hypoxia and is well appaering, expresses her u nderstanding of indications for return.  Meds given in ED:  Medications  LORazepam (ATIVAN) tablet 1 mg (1 mg Oral Given 02/12/14 1439)  levalbuterol (XOPENEX) nebulizer solution 0.63 mg (0.63 mg Nebulization Given 02/12/14 1454)    New Prescriptions   CHLORPHENIRAMINE-HYDROCODONE (TUSSIONEX PENNKINETIC ER) 10-8 MG/5ML LQCR    Take 5 mLs by mouth every 12 (twelve) hours as needed for cough (Cough).   LEVOFLOXACIN (LEVAQUIN) 750 MG TABLET    Take 1 tablet (750 mg total) by mouth daily.        Johnna Acosta, MD 02/12/14 [phone]

## 2014-02-12 NOTE — ED Notes (Addendum)
Pt c/o cough, sob, and r knee pain.  Reports cold symptoms started last week when it snowed.  Pt says knee hurts because she slipped in mud and hit her r knee on the car door.  Pt also says both arms "feel sore in the muscle."

## 2014-02-12 NOTE — Discharge Instructions (Signed)
Please call your doctor for a followup appointment within 24-48 hours. When you talk to your doctor please let them know that you were seen in the emergency department and have them acquire all of your records so that they can discuss the findings with you and formulate a treatment plan to fully care for your new and ongoing problems. ° °

## 2014-02-12 NOTE — ED Notes (Signed)
RT called

## 2014-02-16 NOTE — Telephone Encounter (Signed)
Patient aware and would like a referral to Dr. Arnoldo Morale.  Referral entered.

## 2014-02-16 NOTE — Addendum Note (Signed)
Addended by: Denman George B on: 02/16/2014 01:47 PM   Modules accepted: Orders

## 2014-02-16 NOTE — Telephone Encounter (Signed)
Called and left message for patient to return call.  

## 2014-03-09 ENCOUNTER — Other Ambulatory Visit: Payer: Self-pay | Admitting: Family Medicine

## 2014-03-11 DIAGNOSIS — M25511 Pain in right shoulder: Secondary | ICD-10-CM | POA: Diagnosis not present

## 2014-03-11 DIAGNOSIS — I10 Essential (primary) hypertension: Secondary | ICD-10-CM | POA: Diagnosis not present

## 2014-03-16 DIAGNOSIS — K8020 Calculus of gallbladder without cholecystitis without obstruction: Secondary | ICD-10-CM | POA: Diagnosis not present

## 2014-03-18 ENCOUNTER — Other Ambulatory Visit: Payer: Self-pay | Admitting: Family Medicine

## 2014-03-18 DIAGNOSIS — J069 Acute upper respiratory infection, unspecified: Secondary | ICD-10-CM | POA: Diagnosis not present

## 2014-03-18 DIAGNOSIS — J329 Chronic sinusitis, unspecified: Secondary | ICD-10-CM | POA: Diagnosis not present

## 2014-04-05 ENCOUNTER — Encounter (HOSPITAL_COMMUNITY): Payer: Medicare Other | Attending: Oncology | Admitting: Oncology

## 2014-04-05 VITALS — BP 127/58 | HR 87 | Temp 98.4°F | Resp 18 | Wt 160.2 lb

## 2014-04-05 DIAGNOSIS — J069 Acute upper respiratory infection, unspecified: Secondary | ICD-10-CM | POA: Diagnosis not present

## 2014-04-05 DIAGNOSIS — C50912 Malignant neoplasm of unspecified site of left female breast: Secondary | ICD-10-CM | POA: Diagnosis not present

## 2014-04-05 MED ORDER — AZITHROMYCIN 250 MG PO TABS: ORAL_TABLET | ORAL | Status: DC

## 2014-04-05 NOTE — Patient Instructions (Signed)
[place] at [facility] Discharge Instructions  RECOMMENDATIONS MADE BY THE CONSULTANT AND ANY TEST RESULTS WILL BE SENT TO YOUR REFERRING PHYSICIAN.  You were given a prescription for Erythromycin today. Take as instructed. Return to clinic in 1 year for follow up. Call with any issues/concerns prior to appointments.  Thank you for choosing [place] at [facility] to provide your oncology and hematology care.  To afford each patient quality time with our provider, please arrive at least 15 minutes before your scheduled appointment time.    You need to re-schedule your appointment should you arrive 10 or more minutes late.  We strive to give you quality time with our providers, and arriving late affects you and other patients whose appointments are after yours.  Also, if you no show three or more times for appointments you may be dismissed from the clinic at the providers discretion.     Again, thank you for choosing [facility].  Our hope is that these requests will decrease the amount of time that you wait before being seen by our physicians.       _____________________________________________________________  Should you have questions after your visit to [facility], please contact our office at [phone] between the hours of 8:30 a.m. and 4:30 p.m.  Voicemails left after 4:30 p.m. will not be returned until the following business day.  For prescription refill requests, have your pharmacy contact our office.

## 2014-04-05 NOTE — Progress Notes (Signed)
Tanya Nakayama, MD [address], [address] [place] [address]  Breast cancer, left  URI (upper respiratory infection) - Plan: azithromycin (ZITHROMAX) 250 MG tablet, DISCONTINUED: azithromycin (ZITHROMAX) 250 MG tablet  CURRENT THERAPY: Surveillance per NCCN guidelines  INTERVAL HISTORY: [name] 69 y.o. female returns for followup of Stage I (T1b N0 M0), grade 1 well-differentiated carcinoma of the left breast status post lumpectomy followed by radiation therapy. Her estrogen receptor receptors were 93%, progesterone receptors 67%. HER-2/neu was negative. No lymphovascular invasion was seen. All margins were clear. Ki-67 marker was low at 1% with surgery on 11/15/2004. Treated then with post-lumpectomy radiation, finished as of 02/22/2005, followed by Aromasin beginning on 12/11/2004, which she could not tolerate. We then switched her to Femara, which she finished all therapy as of January 14, 2010.    Breast cancer   11/15/2004 Surgery Lumpectomy    - 02/22/2005 Radiation Therapy    12/11/2004 -  Chemotherapy Aromasin    Adverse Reaction Intolerance to Aromasin    - 01/14/2010 Chemotherapy Femara    I personally reviewed and went over laboratory results with the patient.  The results are noted within this dictation.  I personally reviewed and went over radiographic studies with the patient.  The results are noted within this dictation.  Mammogram on 02/09/2014 was BIRADS 1.   She notes a cough with nasal discharge and headaches that has been ongoing for 1-2 weeks.  She has seen Dr. Arnoldo Morale recently regarding cholecystectomy but he cannot operate until she recovers from this URI.    Oncologically, she denies any complaints and ROS questioning is negative.     Past Medical History  Diagnosis Date  . Hypertension   . Breast cancer, left breast   . Nicotine dependence   . COPD (chronic obstructive pulmonary disease)   . Osteoarthritis   . Kidney stones   .  Diverticula of colon   . Hyperlipidemia   . Breast cancer 03/11/2012    Stage I (T1b N0 M0), grade 1 well-differentiated carcinoma of the left breast status, post lumpectomy followed by radiation therapy. Her estrogen receptor receptors were 93%, progesterone receptors 67%. HER-2/neu was negative. No lymphovascular space invasion was seen. All margins were clear. Ki-67 marker was low at 1% with surgery on 11/15/2004. Treated then with post-lumpectomy radiation, finish  . Diabetes mellitus without complication     has Hyperlipidemia LDL goal <100; NICOTINE ADDICTION; ADJ DISORDER WITH MIXED ANXIETY & DEPRESSED MOOD; Hypertension goal BP (blood pressure) < 130/80; Bronchitis, chronic obstructive w acute bronchitis; COPD; DYSPEPSIA; CONSTIPATION, CHRONIC; GENERALIZED OSTEOARTHROSIS UNSPECIFIED SITE; FATIGUE; Ganglion cyst; Diabetes mellitus type 2 in obese; Breast cancer; Pulmonary nodules; Routine general medical examination at a health care facility; postmenopausal ovarian cyst; Osteopenia; Acute cystitis with hematuria; Enteritis; Annual physical exam; Need for prophylactic vaccination and inoculation against influenza; Neoplasm of uncertain behavior of ovary; and Hydrosalpinx on her problem list.     is allergic to penicillins and sulfonamide derivatives.  [name] had no medications administered during this visit.  Past Surgical History  Procedure Laterality Date  . Abdominal hysterectomy    . Tubal ligation    . Left breast       cancer, in 2006  . Colectomy      2005, diverticulitis  . Breast surgery  2005 approx    left lumpectomy   . Dilation and curettage of uterus      Denies any headaches, dizziness, double  vision, fevers, chills, night sweats, nausea, vomiting, diarrhea, constipation, chest pain, heart palpitations, shortness of breath, blood in stool, black tarry stool, urinary pain, urinary burning, urinary frequency, hematuria.   PHYSICAL EXAMINATION  ECOG PERFORMANCE  STATUS: 0 - Asymptomatic  Filed Vitals:   04/05/14 1429  BP: 127/58  Pulse: 87  Temp: 98.4 F (36.9 C)  Resp: 18    GENERAL:alert, no distress, well nourished, well developed, comfortable, cooperative and smiling SKIN: skin color, texture, turgor are normal, no rashes or significant lesions HEAD: Normocephalic, No masses, lesions, tenderness or abnormalities EYES: normal, PERRLA, EOMI, Conjunctiva are pink and non-injected EARS: External ears normal OROPHARYNX:lips, buccal mucosa, and tongue normal and mucous membranes are moist  NECK: supple, thyroid normal size, non-tender, without nodularity, no stridor, non-tender, trachea midline LYMPH:  no palpable lymphadenopathy, no hepatosplenomegaly BREAST:right breast normal without mass, skin or nipple changes or axillary nodes, left post-lumpectomy site well healed and free of suspicious changes LUNGS: clear to auscultation and percussion HEART: regular rate & rhythm, no murmurs, no gallops, S1 normal and S2 normal ABDOMEN:abdomen soft, non-tender, normal bowel sounds, no masses or organomegaly and no hepatosplenomegaly BACK: Back symmetric, no curvature., No CVA tenderness EXTREMITIES:less then 2 second capillary refill, no joint deformities, effusion, or inflammation, no edema, no skin discoloration, no clubbing, no cyanosis  NEURO: alert & oriented x 3 with fluent speech, no focal motor/sensory deficits, gait normal   LABORATORY DATA: CBC    Component Value Date/Time   WBC 9.1 09/25/2013 1031   RBC 4.86 09/25/2013 1031   HGB 14.1 09/25/2013 1031   HCT 41.9 09/25/2013 1031   PLT 473* 09/25/2013 1031   MCV 86.2 09/25/2013 1031   MCH 29.0 09/25/2013 1031   MCHC 33.7 09/25/2013 1031   RDW 14.0 09/25/2013 1031   LYMPHSABS 1.9 09/09/2013 2345   MONOABS 1.3* 09/09/2013 2345   EOSABS 0.1 09/09/2013 2345   BASOSABS 0.0 09/09/2013 2345      Chemistry      Component Value Date/Time   NA 139 01/18/2014 1218   K 4.9 01/18/2014  1218   CL 104 01/18/2014 1218   CO2 28 01/18/2014 1218   BUN 15 01/18/2014 1218   CREATININE 0.71 01/18/2014 1218   CREATININE 0.83 09/11/2013 0558      Component Value Date/Time   CALCIUM 10.2 01/18/2014 1218   ALKPHOS 58 01/18/2014 1218   AST 16 01/18/2014 1218   ALT 16 01/18/2014 1218   BILITOT 0.4 01/18/2014 1218        RADIOGRAPHIC STUDIES:  02/09/2014  CLINICAL DATA: Screening.  EXAM: DIGITAL SCREENING BILATERAL MAMMOGRAM WITH CAD  COMPARISON: Previous exam(s).  ACR Breast Density Category b: There are scattered areas of fibroglandular density.  FINDINGS: There are no findings suspicious for malignancy. Images were processed with CAD.  IMPRESSION: No mammographic evidence of malignancy. A result letter of this screening mammogram will be mailed directly to the patient.  RECOMMENDATION: Screening mammogram in one year. (Code:SM-B-01Y)  BI-RADS CATEGORY 1: Negative.   Electronically Signed  By: Skipper Cliche M.D.  On: 02/09/2014 10:53     ASSESSMENT AND PLAN:  Breast cancer Stage I (T1b N0 M0), grade 1 well-differentiated carcinoma of the left breast status post lumpectomy followed by radiation therapy. Her estrogen receptor receptors were 93%, progesterone receptors 67%. HER-2/neu was negative. No lymphovascular invasion was seen. All margins were clear. Ki-67 marker was low at 1% with surgery on 11/15/2004. Treated then with post-lumpectomy radiation, finished as of 02/22/2005, followed by Aromasin  beginning on 12/11/2004, which she could not tolerate. We then switched her to Femara, which she finished all therapy as of January 14, 2010.  She is a candidate for release from the [facility].  This was discussed.  No role for oncology labs today.   Next screening mammogram is due in Jan 2017.  She will return in 12 months for follow-up.  She has signs and symptoms of URI.  I will give her a Z-Pak in hopes of expediting her ability to pursue  cholecystectomy by Dr. Arnoldo Morale.       THERAPY PLAN:  NCCN guidelines recommends the following surveillance for invasive breast cancer:  A. History and Physical exam every 4-6 months for 5 years and then every 12 months.  B. Mammography every 12 months  C. Women on Tamoxifen: annual gynecologic assessment every 12 months if uterus is present.  D. Women on aromatase inhibitor or who experience ovarian failure secondary to treatment should have monitoring of bone health with a bone mineral density determination at baseline and periodically thereafter.  E. Assess and encourage adherence to adjuvant endocrine therapy.  F. Evidence suggests that active lifestyle and achieving and maintaining an ideal body weight (20-25 BMI) may lead to optimal breast cancer outcomes.   All questions were answered. The patient knows to call the clinic with any problems, questions or concerns. We can certainly see the patient much sooner if necessary.  Patient and plan discussed with Dr. Ancil Linsey and she is in agreement with the aforementioned.   This note is electronically signed by: Robynn Pane 04/05/2014 3:30 PM

## 2014-04-05 NOTE — Assessment & Plan Note (Addendum)
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 Her estrogen receptor receptors were 93%, progesterone receptors 67%. HER-2/neu was negative. No lymphovascular invasion was seen. All margins were clear. Ki-67 marker was low at 1% with surgery on 11/15/2004. Treated then with post-lumpectomy radiation, finished as of 02/22/2005, followed by Aromasin beginning on 12/11/2004, which she could not tolerate. We then switched her to Femara, which she finished all therapy as of January 14, 2010.  She is a candidate for release from the [facility].  This was discussed.  No role for oncology labs today.   Next screening mammogram is due in Jan 2017.  She will return in 12 months for follow-up.  She has signs and symptoms of URI.  I will give her a Z-Pak in hopes of expediting her ability to pursue cholecystectomy by Dr. Arnoldo Morale.

## 2014-04-09 ENCOUNTER — Other Ambulatory Visit: Payer: Self-pay | Admitting: Family Medicine

## 2014-04-19 ENCOUNTER — Other Ambulatory Visit: Payer: Self-pay | Admitting: Family Medicine

## 2014-04-23 ENCOUNTER — Other Ambulatory Visit: Payer: Self-pay | Admitting: Family Medicine

## 2014-04-26 ENCOUNTER — Other Ambulatory Visit: Payer: Self-pay | Admitting: Family Medicine

## 2014-04-28 ENCOUNTER — Other Ambulatory Visit: Payer: Self-pay | Admitting: Family Medicine

## 2014-05-02 ENCOUNTER — Encounter (HOSPITAL_COMMUNITY): Payer: Self-pay | Admitting: Emergency Medicine

## 2014-05-02 ENCOUNTER — Emergency Department (HOSPITAL_COMMUNITY)
Admission: EM | Admit: 2014-05-02 | Discharge: 2014-05-02 | Disposition: A | Discharge status: Home / Self Care | Payer: Medicare Other | Attending: Emergency Medicine | Admitting: Emergency Medicine

## 2014-05-02 DIAGNOSIS — Z79899 Other long term (current) drug therapy: Secondary | ICD-10-CM | POA: Insufficient documentation

## 2014-05-02 DIAGNOSIS — Z853 Personal history of malignant neoplasm of breast: Secondary | ICD-10-CM | POA: Insufficient documentation

## 2014-05-02 DIAGNOSIS — J441 Chronic obstructive pulmonary disease with (acute) exacerbation: Secondary | ICD-10-CM | POA: Diagnosis not present

## 2014-05-02 DIAGNOSIS — Z880 Allergy status to penicillin: Secondary | ICD-10-CM | POA: Diagnosis not present

## 2014-05-02 DIAGNOSIS — Z7982 Long term (current) use of aspirin: Secondary | ICD-10-CM | POA: Diagnosis not present

## 2014-05-02 DIAGNOSIS — I10 Essential (primary) hypertension: Secondary | ICD-10-CM | POA: Insufficient documentation

## 2014-05-02 DIAGNOSIS — Z87442 Personal history of urinary calculi: Secondary | ICD-10-CM | POA: Insufficient documentation

## 2014-05-02 DIAGNOSIS — M1990 Unspecified osteoarthritis, unspecified site: Secondary | ICD-10-CM | POA: Diagnosis not present

## 2014-05-02 DIAGNOSIS — J40 Bronchitis, not specified as acute or chronic: Secondary | ICD-10-CM | POA: Diagnosis not present

## 2014-05-02 DIAGNOSIS — Z8719 Personal history of other diseases of the digestive system: Secondary | ICD-10-CM | POA: Insufficient documentation

## 2014-05-02 DIAGNOSIS — F17210 Nicotine dependence, cigarettes, uncomplicated: Secondary | ICD-10-CM | POA: Diagnosis not present

## 2014-05-02 DIAGNOSIS — Z720 Tobacco use: Secondary | ICD-10-CM | POA: Insufficient documentation

## 2014-05-02 DIAGNOSIS — E119 Type 2 diabetes mellitus without complications: Secondary | ICD-10-CM | POA: Insufficient documentation

## 2014-05-02 DIAGNOSIS — Z792 Long term (current) use of antibiotics: Secondary | ICD-10-CM | POA: Insufficient documentation

## 2014-05-02 DIAGNOSIS — R05 Cough: Secondary | ICD-10-CM | POA: Diagnosis present

## 2014-05-02 MED ORDER — PREDNISONE 20 MG PO TABS: 60.0000 mg | ORAL_TABLET | Freq: Every day | ORAL | Status: DC

## 2014-05-02 MED ORDER — HYDROCOD POLST-CPM POLST ER 10-8 MG/5ML PO SUER: 5.0000 mL | Freq: Two times a day (BID) | ORAL | Status: DC | PRN

## 2014-05-02 MED ORDER — HYDROCOD POLST-CPM POLST ER 10-8 MG/5ML PO SUER
5.0000 mL | Freq: Once | ORAL | Status: AC
  Administered 2014-05-02: 5 mL via ORAL
  Filled 2014-05-02: qty 5

## 2014-05-02 MED ORDER — ALBUTEROL SULFATE HFA 108 (90 BASE) MCG/ACT IN AERS
2.0000 | INHALATION_SPRAY | RESPIRATORY_TRACT | Status: DC | PRN
  Administered 2014-05-02: 2 via RESPIRATORY_TRACT
  Filled 2014-05-02: qty 6.7

## 2014-05-02 MED ORDER — PREDNISONE 50 MG PO TABS
60.0000 mg | ORAL_TABLET | Freq: Once | ORAL | Status: AC
  Administered 2014-05-02: 60 mg via ORAL
  Filled 2014-05-02 (×2): qty 1

## 2014-05-02 MED ORDER — AZITHROMYCIN 250 MG PO TABS: 250.0000 mg | ORAL_TABLET | Freq: Every day | ORAL | Status: DC

## 2014-05-02 MED ORDER — AZITHROMYCIN 250 MG PO TABS
500.0000 mg | ORAL_TABLET | Freq: Once | ORAL | Status: AC
  Administered 2014-05-02: 500 mg via ORAL
  Filled 2014-05-02: qty 2

## 2014-05-02 NOTE — ED Notes (Signed)
Pt reports productive cough, runny nose and chills.

## 2014-05-02 NOTE — ED Provider Notes (Signed)
CSN: [id number]     Arrival date & time 05/02/14  1516 History  This chart was scribed for a non-physician practitioner, Charlann Lange, PA-C working with No att. providers found by Martinique Peace, ED Scribe. The patient was seen in APFT23/APFT23. The patient's care was started at 3:49 PM.    Chief Complaint  Patient presents with  . Cough      Patient is a 69 y.o. female presenting with cough. The history is provided by the patient. No language interpreter was used.  Cough Associated symptoms: chills and rhinorrhea   Associated symptoms: no chest pain and no fever    HPI Comments: [name] is a 69 y.o. female who presents to the Emergency Department complaining of cough with rhinorrhea and chills. She also complains of some rib pain due to coughing. No complaints of fever, chest pain, nausea, or vomiting. Pt states she has tried taking some OTC medication that has provided her with some relief. History of COPD. Pt uses albuterol inhaler at home regularly. Pt is a current smoker but reports she is trying to quit. History of hypertension and breast cancer.    Past Medical History  Diagnosis Date  . Hypertension   . Breast cancer, left breast   . Nicotine dependence   . COPD (chronic obstructive pulmonary disease)   . Osteoarthritis   . Kidney stones   . Diverticula of colon   . Hyperlipidemia   . Breast cancer 03/11/2012    Stage I (T1b N0 M0), grade 1 well-differentiated carcinoma of the left breast status, post lumpectomy followed by radiation therapy. Her estrogen receptor receptors were 93%, progesterone receptors 67%. HER-2/neu was negative. No lymphovascular space invasion was seen. All margins were clear. Ki-67 marker was low at 1% with surgery on 11/15/2004. Treated then with post-lumpectomy radiation, finish  . Diabetes mellitus without complication    Past Surgical History  Procedure Laterality Date  . Abdominal hysterectomy    . Tubal ligation    . Left breast        cancer, in 2006  . Colectomy      2005, diverticulitis  . Breast surgery  2005 approx    left lumpectomy   . Dilation and curettage of uterus     Family History  Problem Relation Age of Onset  . Cancer Mother     breast  . COPD Father   . Cancer Sister     cancer   History  Substance Use Topics  . Smoking status: Current Every Day Smoker -- 0.50 packs/day for 50 years    Types: Cigarettes  . Smokeless tobacco: Current User     Comment: Using E-Cig  . Alcohol Use: No   OB History    Gravida Para Term Preterm AB TAB SAB Ectopic Multiple Living   '8 5 5  3  3   2     ' Review of Systems  Constitutional: Positive for chills. Negative for fever.  HENT: Positive for rhinorrhea.   Respiratory: Positive for cough.   Cardiovascular: Negative for chest pain.  Gastrointestinal: Negative for nausea and vomiting.      Allergies  Penicillins and Sulfonamide derivatives  Home Medications   Prior to Admission medications   Medication Sig Start Date End Date Taking? Authorizing Provider  acetaminophen (TYLENOL) 500 MG tablet Take 500-1,000 mg by mouth every 6 (six) hours as needed for moderate pain.    Yes Historical Provider, MD  albuterol (PROVENTIL) (2.5 MG/3ML) 0.083% nebulizer solution USE  1 VIAL IN NEBULIZER 3 TIMES DAILY. 08/11/13  Yes [name], MD  amLODipine (NORVASC) 5 MG tablet TAKE 1 TABLET BY MOUTH ONCE A DAY. 04/19/14  Yes [name], MD  aspirin EC 81 MG tablet Take 81 mg by mouth daily.   Yes Historical Provider, MD  ATROVENT HFA 17 MCG/ACT inhaler INHALE 2 PUFFS BY MOUTH FOUR TIMES A DAY. 04/28/14  Yes [name], MD  benazepril (LOTENSIN) 40 MG tablet TAKE ONE TABLET BY MOUTH DAILY. 01/20/14  Yes [name], MD  Calcium Carbonate-Vitamin D (CALCIUM 600 + D PO) Take 1 tablet by mouth 2 (two) times daily.    Yes Historical Provider, MD  fluticasone (FLONASE) 50 MCG/ACT nasal spray Place 2 sprays into both nostrils daily.   Yes Historical  Provider, MD  HYDROcodone-acetaminophen (NORCO) 7.5-325 MG per tablet Take 1 tablet by mouth every 6 (six) hours as needed for moderate pain or severe pain. Patient taking differently: Take 1 tablet by mouth every 4 (four) hours as needed for moderate pain or severe pain.  09/11/13  Yes Ripudeep Krystal Eaton, MD  loratadine (CLARITIN) 10 MG tablet TAKE 1 TABLET BY MOUTH ONCE DAILY FOR ALLERGIES. 04/12/14  Yes [name], MD  metFORMIN (GLUCOPHAGE) 500 MG tablet TAKE 1 TABLET BY MOUTH DAILY WITH BREAKFAST. 04/26/14  Yes [name], MD  Omega-3 Fatty Acids (FISH OIL) 1000 MG CAPS Take 2 capsules by mouth daily.   Yes Historical Provider, MD  polyethylene glycol powder (GLYCOLAX/MIRALAX) powder MIX 1 CAPFUL IN 8 OUNCES OF JUICE OR WATER AND DRINK ONCE DAILY. 04/23/14  Yes [name], MD  potassium chloride SA (K-DUR,KLOR-CON) 20 MEQ tablet TAKE 1 TABLET BY MOUTH DAILY. 04/19/14  Yes [name], MD  simvastatin (ZOCOR) 20 MG tablet TAKE (1) TABLET BY MOUTH DAILY AT 6 PM FOR CHOLESTEROL. 03/19/14  Yes [name], MD  tiZANidine (ZANAFLEX) 4 MG capsule Take 4 mg by mouth every 8 (eight) hours as needed for muscle spasms.   Yes Historical Provider, MD  azithromycin (ZITHROMAX) 250 MG tablet As directed Patient not taking: Reported on 05/02/2014 04/05/14   Manon Hilding Kefalas, PA-C  benzonatate (TESSALON) 100 MG capsule Take 1 capsule (100 mg total) by mouth 2 (two) times daily as needed for cough. Patient not taking: Reported on 04/05/2014 01/21/14   [name], MD  chlorpheniramine-HYDROcodone [facility]) 10-8 MG/5ML [facility] Take 5 mLs by mouth every 12 (twelve) hours as needed for cough (Cough). Patient not taking: Reported on 04/05/2014 02/12/14   Noemi Chapel, MD  docusate sodium 100 MG CAPS Take 100 mg by mouth 2 (two) times daily. Patient not taking: Reported on 04/05/2014 09/11/13   Ripudeep Krystal Eaton, MD  FLUoxetine (PROZAC) 10 MG capsule Take 1 capsule (10 mg total) by  mouth daily. Patient not taking: Reported on 01/21/2014 10/05/13   [name], MD  phenazopyridine (PYRIDIUM) 200 MG tablet Take 1 tablet (200 mg total) by mouth 3 (three) times daily as needed for pain. Patient not taking: Reported on 01/21/2014 01/06/14   Jonnie Kind, MD  promethazine (PHENERGAN) 12.5 MG tablet Take 1 tablet (12.5 mg total) by mouth every 8 (eight) hours as needed for nausea or vomiting. Patient not taking: Reported on 04/05/2014 09/15/13   [name], MD   BP 136/73 mmHg  Pulse 89  Temp(Src) 98.4 F (36.9 C) (Oral)  Resp 20  Ht '5\' 5"'  (1.651 m)  Wt 158  lb (71.668 kg)  BMI 26.29 kg/m2  SpO2 97% Physical Exam  Constitutional: She is oriented to person, place, and time. She appears well-developed and well-nourished. No distress.  HENT:  Head: Normocephalic and atraumatic.  Eyes: Conjunctivae and EOM are normal.  Neck: Neck supple. No tracheal deviation present.  Cardiovascular: Normal rate.   Pulmonary/Chest: Effort normal. She has wheezes. She has no rales.  Expiratory wheezes throughout. Actively coughing. No rales. Diffuse rhonchi.   Abdominal: Soft. She exhibits no distension.  Musculoskeletal: Normal range of motion.  Neurological: She is alert and oriented to person, place, and time.  Skin: Skin is warm and dry.  Psychiatric: She has a normal mood and affect. Her behavior is normal.  Nursing note and vitals reviewed.   ED Course  Procedures (including critical care time) Labs Review Labs Reviewed - No data to display  Imaging Review No results found.   EKG Interpretation None     Medications - No data to display  3:51 PM- Treatment plan was discussed with patient who verbalizes understanding and agrees.   MDM   Final diagnoses:  None    1. Bronchitis 2. H/o COPD 3. Tobacco abuse, ongoing  She is well appearing, afebrile without hypoxia. Will treat with abx, cough suppressant and prednisone. Encouraged 2 day recheck with  primary care.  I personally performed the services described in this documentation, which was scribed in my presence. The recorded information has been reviewed and is accurate.     Charlann Lange, PA-C 05/02/14 [place], MD 05/02/14 1620

## 2014-05-02 NOTE — Discharge Instructions (Signed)
Smoking Cessation Quitting smoking is important to your health and has many advantages. However, it is not always easy to quit since nicotine is a very addictive drug. Oftentimes, people try 3 times or more before being able to quit. This document explains the best ways for you to prepare to quit smoking. Quitting takes hard work and a lot of effort, but you can do it. ADVANTAGES OF QUITTING SMOKING  You will live longer, feel better, and live better.  Your body will feel the impact of quitting smoking almost immediately.  Within 20 minutes, blood pressure decreases. Your pulse returns to its normal level.  After 8 hours, carbon monoxide levels in the blood return to normal. Your oxygen level increases.  After 24 hours, the chance of having a heart attack starts to decrease. Your breath, hair, and body stop smelling like smoke.  After 48 hours, damaged nerve endings begin to recover. Your sense of taste and smell improve.  After 72 hours, the body is virtually free of nicotine. Your bronchial tubes relax and breathing becomes easier.  After 2 to 12 weeks, lungs can hold more air. Exercise becomes easier and circulation improves.  The risk of having a heart attack, stroke, cancer, or lung disease is greatly reduced.  After 1 year, the risk of coronary heart disease is cut in half.  After 5 years, the risk of stroke falls to the same as a nonsmoker.  After 10 years, the risk of lung cancer is cut in half and the risk of other cancers decreases significantly.  After 15 years, the risk of coronary heart disease drops, usually to the level of a nonsmoker.  If you are pregnant, quitting smoking will improve your chances of having a healthy baby.  The people you live with, especially any children, will be healthier.  You will have extra money to spend on things other than cigarettes. QUESTIONS TO THINK ABOUT BEFORE ATTEMPTING TO QUIT You may want to talk about your answers with your  health care provider.  Why do you want to quit?  If you tried to quit in the past, what helped and what did not?  What will be the most difficult situations for you after you quit? How will you plan to handle them?  Who can help you through the tough times? Your family? Friends? A health care provider?  What pleasures do you get from smoking? What ways can you still get pleasure if you quit? Here are some questions to ask your health care provider:  How can you help me to be successful at quitting?  What medicine do you think would be best for me and how should I take it?  What should I do if I need more help?  What is smoking withdrawal like? How can I get information on withdrawal? GET READY  Set a quit date.  Change your environment by getting rid of all cigarettes, ashtrays, matches, and lighters in your home, car, or work. Do not let people smoke in your home.  Review your past attempts to quit. Think about what worked and what did not. GET SUPPORT AND ENCOURAGEMENT You have a better chance of being successful if you have help. You can get support in many ways.  Tell your family, friends, and coworkers that you are going to quit and need their support. Ask them not to smoke around you.  Get individual, group, or telephone counseling and support. Programs are available at [employer] and health centers. Call  your local health department for information about programs in your area.  Spiritual beliefs and practices may help some smokers quit.  Download a "quit meter" on your computer to keep track of quit statistics, such as how long you have gone without smoking, cigarettes not smoked, and money saved.  Get a self-help book about quitting smoking and staying off tobacco. Riggins yourself from urges to smoke. Talk to someone, go for a walk, or occupy your time with a task.  Change your normal routine. Take a different route to work.  Drink tea instead of coffee. Eat breakfast in a different place.  Reduce your stress. Take a hot bath, exercise, or read a book.  Plan something enjoyable to do every day. Reward yourself for not smoking.  Explore interactive web-based programs that specialize in helping you quit. GET MEDICINE AND USE IT CORRECTLY Medicines can help you stop smoking and decrease the urge to smoke. Combining medicine with the above behavioral methods and support can greatly increase your chances of successfully quitting smoking.  Nicotine replacement therapy helps deliver nicotine to your body without the negative effects and risks of smoking. Nicotine replacement therapy includes nicotine gum, lozenges, inhalers, nasal sprays, and skin patches. Some may be available over-the-counter and others require a prescription.  Antidepressant medicine helps people abstain from smoking, but how this works is unknown. This medicine is available by prescription.  Nicotinic receptor partial agonist medicine simulates the effect of nicotine in your brain. This medicine is available by prescription. Ask your health care provider for advice about which medicines to use and how to use them based on your health history. Your health care provider will tell you what side effects to look out for if you choose to be on a medicine or therapy. Carefully read the information on the package. Do not use any other product containing nicotine while using a nicotine replacement product.  RELAPSE OR DIFFICULT SITUATIONS Most relapses occur within the first 3 months after quitting. Do not be discouraged if you start smoking again. Remember, most people try several times before finally quitting. You may have symptoms of withdrawal because your body is used to nicotine. You may crave cigarettes, be irritable, feel very hungry, cough often, get headaches, or have difficulty concentrating. The withdrawal symptoms are only temporary. They are strongest  when you first quit, but they will go away within 10-14 days. To reduce the chances of relapse, try to:  Avoid drinking alcohol. Drinking lowers your chances of successfully quitting.  Reduce the amount of caffeine you consume. Once you quit smoking, the amount of caffeine in your body increases and can give you symptoms, such as a rapid heartbeat, sweating, and anxiety.  Avoid smokers because they can make you want to smoke.  Do not let weight gain distract you. Many smokers will gain weight when they quit, usually less than 10 pounds. Eat a healthy diet and stay active. You can always lose the weight gained after you quit.  Find ways to improve your mood other than smoking. FOR MORE INFORMATION  www.smokefree.gov  Document Released: 12/26/2000 Document Revised: 05/18/2013 Document Reviewed: 04/12/2011 [facility] Patient Information 2015 [place], [place]. This information is not intended to replace advice given to you by your health care provider. Make sure you discuss any questions you have with your health care provider. Acute Bronchitis Bronchitis is inflammation of the airways that extend from the windpipe into the lungs (bronchi). The inflammation often causes mucus  to develop. This leads to a cough, which is the most common symptom of bronchitis.  In acute bronchitis, the condition usually develops suddenly and goes away over time, usually in a couple weeks. Smoking, allergies, and asthma can make bronchitis worse. Repeated episodes of bronchitis may cause further lung problems.  CAUSES Acute bronchitis is most often caused by the same virus that causes a cold. The virus can spread from person to person (contagious) through coughing, sneezing, and touching contaminated objects. SIGNS AND SYMPTOMS   Cough.   Fever.   Coughing up mucus.   Body aches.   Chest congestion.   Chills.   Shortness of breath.   Sore throat.  DIAGNOSIS  Acute bronchitis is usually diagnosed  through a physical exam. Your health care provider will also ask you questions about your medical history. Tests, such as chest X-rays, are sometimes done to rule out other conditions.  TREATMENT  Acute bronchitis usually goes away in a couple weeks. Oftentimes, no medical treatment is necessary. Medicines are sometimes given for relief of fever or cough. Antibiotic medicines are usually not needed but may be prescribed in certain situations. In some cases, an inhaler may be recommended to help reduce shortness of breath and control the cough. A cool mist vaporizer may also be used to help thin bronchial secretions and make it easier to clear the chest.  HOME CARE INSTRUCTIONS  Get plenty of rest.   Drink enough fluids to keep your urine clear or pale yellow (unless you have a medical condition that requires fluid restriction). Increasing fluids may help thin your respiratory secretions (sputum) and reduce chest congestion, and it will prevent dehydration.   Take medicines only as directed by your health care provider.  If you were prescribed an antibiotic medicine, finish it all even if you start to feel better.  Avoid smoking and secondhand smoke. Exposure to cigarette smoke or irritating chemicals will make bronchitis worse. If you are a smoker, consider using nicotine gum or skin patches to help control withdrawal symptoms. Quitting smoking will help your lungs heal faster.   Reduce the chances of another bout of acute bronchitis by washing your hands frequently, avoiding people with cold symptoms, and trying not to touch your hands to your mouth, nose, or eyes.   Keep all follow-up visits as directed by your health care provider.  SEEK MEDICAL CARE IF: Your symptoms do not improve after 1 week of treatment.  SEEK IMMEDIATE MEDICAL CARE IF:  You develop an increased fever or chills.   You have chest pain.   You have severe shortness of breath.  You have bloody sputum.   You  develop dehydration.  You faint or repeatedly feel like you are going to pass out.  You develop repeated vomiting.  You develop a severe headache. MAKE SURE YOU:   Understand these instructions.  Will watch your condition.  Will get help right away if you are not doing well or get worse. Document Released: 02/09/2004 Document Revised: 05/18/2013 Document Reviewed: 06/24/2012 [facility] Patient Information 2015 [place], [place]. This information is not intended to replace advice given to you by your health care provider. Make sure you discuss any questions you have with your health care provider.

## 2014-05-02 NOTE — ED Notes (Signed)
Patient with no complaints at this time. Respirations even and unlabored. Skin warm/dry. Discharge instructions reviewed with patient at this time. Patient given opportunity to voice concerns/ask questions. Patient discharged at this time and left Emergency Department with steady gait.   

## 2014-05-05 ENCOUNTER — Ambulatory Visit (INDEPENDENT_AMBULATORY_CARE_PROVIDER_SITE_OTHER): Payer: Medicare Other | Admitting: Family Medicine

## 2014-05-05 ENCOUNTER — Encounter: Payer: Self-pay | Admitting: Family Medicine

## 2014-05-05 VITALS — BP 160/80 | HR 94 | Resp 16 | Ht 65.0 in | Wt 168.0 lb

## 2014-05-05 DIAGNOSIS — J440 Chronic obstructive pulmonary disease with acute lower respiratory infection: Secondary | ICD-10-CM

## 2014-05-05 DIAGNOSIS — E119 Type 2 diabetes mellitus without complications: Secondary | ICD-10-CM

## 2014-05-05 DIAGNOSIS — I10 Essential (primary) hypertension: Secondary | ICD-10-CM | POA: Diagnosis not present

## 2014-05-05 DIAGNOSIS — E1169 Type 2 diabetes mellitus with other specified complication: Secondary | ICD-10-CM

## 2014-05-05 DIAGNOSIS — Z720 Tobacco use: Secondary | ICD-10-CM | POA: Diagnosis not present

## 2014-05-05 DIAGNOSIS — E669 Obesity, unspecified: Secondary | ICD-10-CM

## 2014-05-05 DIAGNOSIS — F17200 Nicotine dependence, unspecified, uncomplicated: Secondary | ICD-10-CM

## 2014-05-05 MED ORDER — PREDNISONE 5 MG (21) PO TBPK: 5.0000 mg | ORAL_TABLET | ORAL | Status: DC

## 2014-05-05 MED ORDER — BENZONATATE 100 MG PO CAPS: 100.0000 mg | ORAL_CAPSULE | Freq: Two times a day (BID) | ORAL | Status: DC | PRN

## 2014-05-05 NOTE — Progress Notes (Signed)
   Subjective:    Patient ID: [name], female    DOB: 05/22/1945, 69 y.o.   MRN: [id number]  HPI Pt in for f/u of acute bronchitis , treatment started in the Ed 2 days ago, feeling somewhat better, though still a lot of wheezing and cough, no fever or chill,s Ready to quit and wants script for patches  Review of Systems See HPI Denies chest pains, palpitations and leg swelling Denies abdominal pain, nausea, vomiting,diarrhea or constipation.   Denies dysuria, frequency, hesitancy or incontinence. Denies joint pain, swelling and limitation in mobility. Denies headaches, seizures, numbness, or tingling. Denies depression, anxiety or insomnia. Denies skin break down or rash.        Objective:   Physical Exam BP 160/80 mmHg  Pulse 94  Resp 16  Ht 5\' 5"  (1.651 m)  Wt 168 lb (76.204 kg)  BMI 27.96 kg/m2  SpO2 94%  Patient alert and oriented and in no cardiopulmonary distress.  HEENT: No facial asymmetry, EOMI,   oropharynx pink and moist.  Neck supple no JVD, no mass.  Chest: Clear to auscultation bilaterally.  CVS: S1, S2 no murmurs, no S3.Regular rate.  ABD: Soft non tender.   Ext: No edema  MS: Adequate ROM spine, shoulders, hips and knees.  Skin: Intact, no ulcerations or rash noted.  Psych: Good eye contact, normal affect. Memory intact not anxious or depressed appearing.  CNS: CN 2-12 intact, power,  normal throughout.no focal deficits noted.        Assessment & Plan:  NICOTINE ADDICTION Patient counseled for approximately 5 minutes regarding the health risks of ongoing nicotine use, specifically all types of cancer, heart disease, stroke and respiratory failure. The options available for help with cessation ,the behavioral changes to assist the process, and the option to either gradully reduce usage  Or abruptly stop.is also discussed. Pt is also encouraged to set specific goals in number of cigarettes used daily, as well as to set a quit  date.  Number of cigarettes/cigars currently smoking daily: 10   Bronchitis, chronic obstructive w acute bronchitis Improving, pt to complete antibiotic course and additional prednisone 5 mg dose pack is prescribed  Hypertension goal BP (blood pressure) < 130/80 Controlled, no change in medication

## 2014-05-05 NOTE — Patient Instructions (Addendum)
May appt as before  Complete antibitoic course and prednisone dose pack is prescribed to start after the delatosone also tessalon perles  IF you start having green sputum or nasal drainage, call for container to submit specimen for testing. Cough is mainly from allergies and long standing nicotine  Script for nicoderm is handed to you, DO NOT SMOKE with this, quit date should be 6 weeks atter starting  Thanks for choosing [facility], we consider it a privelige to serve you.

## 2014-05-05 NOTE — Assessment & Plan Note (Signed)

## 2014-05-06 LAB — MICROALBUMIN / CREATININE URINE RATIO
CREATININE, URINE: 49.8 mg/dL
MICROALB UR: 4.2 mg/dL — AB (ref <2.0)
MICROALB/CREAT RATIO: 84.3 mg/g — AB (ref 0.0–30.0)

## 2014-05-13 DIAGNOSIS — I10 Essential (primary) hypertension: Secondary | ICD-10-CM | POA: Diagnosis not present

## 2014-05-13 DIAGNOSIS — Z6828 Body mass index (BMI) 28.0-28.9, adult: Secondary | ICD-10-CM | POA: Diagnosis not present

## 2014-05-13 DIAGNOSIS — M25511 Pain in right shoulder: Secondary | ICD-10-CM | POA: Diagnosis not present

## 2014-05-19 NOTE — H&P (Signed)
  NTS SOAP Note  Vital Signs:  Vitals as of: 06/23/319: Systolic 224: Diastolic 84: Heart Rate 89: Temp 98.10F: Height 5ft 5in: Weight 158Lbs 0 Ounces: Pain Level 6: BMI 26.29  BMI : 26.29 kg/m2  Subjective: This 68 year old female presents for of a gallstone.  Seen on ct scan of the abdomen.  Is having right upper quadrant abdominal pain and nausea.  Made worse with fatty food.  No fever,  chills,  jaundice.  Review of Symptoms:  Constitutional:unremarkable   Head:unremarkable Eyes:blurred vision bilateral sinus problems Cardiovascular:  unremarkable Respiratory: intermittentwheezing, cough Gastrointestinabdominal pain, nausea Genitourinary:unremarkable   joint pain Skin:unremarkable Hematolgic/Lymphatic:unremarkable   Allergic/Immunologic:unremarkable   Past Medical History:  Reviewed  Past Medical History  Surgical History:  Other  Partial colectomy with colostomy, colostomy takedown, left partial mastectomy with sentinal lymph node dissection Medical Problems:  Cancer, High Blood pressure, High cholesterol, h/o diverticulitis Allergies: PCN, sulfa Medications: cyclobenzabrine, naproxen, potassium supplements, tizandine,  vicodin, simvastatin, cephalexin   Social History:Reviewed  Social History  Preferred Language: English ([place]) Race:  White Ethnicity: Not Hispanic / Latino Age: 17 Years 7 Months Alcohol:  No Recreational drug(s):  No   Smoking Status: Current every day smoker reviewed on 03/17/2014 Started Date: 01/16/1969 Packs per day: 0.50 Functional Status reviewed on 03/17/2014 ------------------------------------------------ Bathing: Normal Cooking: Normal Dressing: Normal Driving: Normal Eating: Normal Managing Meds: Normal Oral Care: Normal Shopping: Normal Toileting: Normal Transferring: Normal Walking: Normal Cognitive Status reviewed on 03/17/2014 ------------------------------------------------ Attention:  Normal Decision Making: Normal Language: Normal Memory: Normal Motor: Normal Perception: Normal Problem Solving: Normal Visual and Spatial: Normal   Family History:Reviewed  Family Health History Family History is Unknown    Objective Information: General:Well appearing, well nourished in no distress. Heart:RRR, no murmur or gallop.  Normal S1, S2.  No S3, S4.  Distant heart sounds,  no wheezing noted  Abdomen:Soft, NT/ND, normal bowel sounds, no HSM, no masses.  No peritoneal signs.Large midline scar present.  Assessment:Biliary colic,  cholelithiasis  Diagnoses: 574.20  K80.20 Gallstone (Calculus of gallbladder without cholecystitis without obstruction)  Procedures: 82500 - OFFICE OUTPATIENT NEW 30 MINUTES    Plan:  Scheduled for laparoscopic cholecystectomy, possible open procedure on 05/26/2014.   Patient Education:Alternative treatments to surgery were discussed with patient (and family).  Risks and benefits  of procedure including bleeding,  infection,  hepatobiliary injury,  and the possibility of an open procedure were fully explained to the patient (and family) who gave informed consent. Patient/family questions were addressed.

## 2014-05-20 NOTE — Patient Instructions (Signed)
[name]  05/20/2014   Your procedure is scheduled on:   05/26/2014  Report to [facility] at  700  AM.  Call this number if you have problems the morning of surgery: [phone]   Remember:   Do not eat food or drink liquids after midnight.   Take these medicines the morning of surgery with A SIP OF WATER: norvasc, lotensin, tessalon, claritin, zanaflex. Take your inhalers and your nebulizer before you come.   Do not wear jewelry, make-up or nail polish.  Do not wear lotions, powders, or perfumes.  Do not shave 48 hours prior to surgery. Men may shave face and neck.  Do not bring valuables to the hospital.  [facility] is not responsible  for any belongings or valuables.               Contacts, dentures or bridgework may not be worn into surgery.  Leave suitcase in the car. After surgery it may be brought to your room.  For patients admitted to the hospital, discharge time is determined by your treatment team.               Patients discharged the day of surgery will not be allowed to drive home.  Name and phone number of your driver: family  Special Instructions: Shower using CHG 2 nights before surgery and the night before surgery.  If you shower the day of surgery use CHG.  Use special wash - you have one bottle of CHG for all showers.  You should use approximately 1/3 of the bottle for each shower.   Please read over the following fact sheets that you were given: Pain Booklet, Coughing and Deep Breathing, Surgical Site Infection Prevention, Anesthesia Post-op Instructions and Care and Recovery After Surgery Open Cholecystectomy Open cholecystectomy is surgery to remove the gallbladder. The gallbladder is located in the upper right part of the abdomen, behind the liver. It is a storage sac for the bile produced in the liver. Bile aids in the digestion and absorption of fats. Cholecystectomy is often done for inflammation of the gallbladder (cholecystitis). This condition  is usually caused by a buildup of gallstones (cholelithiasis) in your gallbladder. Gallstones can block the flow of bile, resulting in inflammation, pain, and possibly infection. In severe cases, emergency surgery may be required. When emergency surgery is not required, you will have time to prepare for the procedure. LET [facility] CARE PROVIDER KNOW ABOUT:  Any allergies you have.  All medicines you are taking, including vitamins, herbs, eye drops, creams, and over-the-counter medicines.  Previous problems you or members of your family have had with the use of anesthetics.  Any blood disorders you have.  Previous surgeries you have had.  Medical conditions you have. RISKS AND COMPLICATIONS Generally, this is a safe procedure. However, as with any procedure, complications can occur. Possible complications include:  Infection.  Damage to thecommon bile duct, nerves, arteries, veins, or other internal organs such as the stomach or intestines.  Bleeding after surgery.  A stone may remain in the common bile duct. BEFORE THE PROCEDURE  Ask your health care provider about changing or stopping any regular medicines. You will need to stop taking aspirin or blood thinners at least 5 days prior to surgery.  Do not eat or drink anything after midnight the night before surgery.  Let your health care provider know if you develop a cold or other infectious problem before surgery. PROCEDURE  You will be given medicine that makes you sleep through the procedure (general anesthetic).  When you are asleep, your surgeon will make a cut (incision) in the upper abdomen to access your gallbladder.  The surgeon will examine the inside of your abdomen and make sure there are no other problems. Your gallbladder will then be removed.  An X-ray exam of your common bile duct may be done to look for stones that may have fallen into this duct.  After the removal of your gallbladder, your abdomen will  be closed with stitches (sutures) or staples. AFTER THE PROCEDURE  You will be taken to a recovery area where your progress will be checked often.  You will likely need to stay in the hospital for 3-5 days. Document Released: 09/23/2001 Document Revised: 10/22/2012 Document Reviewed: 08/13/2012 [facility] Patient Information 2015 [place], [place]. This information is not intended to replace advice given to you by your health care provider. Make sure you discuss any questions you have with your health care provider. Laparoscopic Cholecystectomy Laparoscopic cholecystectomy is surgery to remove the gallbladder. The gallbladder is located in the upper right part of the abdomen, behind the liver. It is a storage sac for bile produced in the liver. Bile aids in the digestion and absorption of fats. Cholecystectomy is often done for inflammation of the gallbladder (cholecystitis). This condition is usually caused by a buildup of gallstones (cholelithiasis) in your gallbladder. Gallstones can block the flow of bile, resulting in inflammation and pain. In severe cases, emergency surgery may be required. When emergency surgery is not required, you will have time to prepare for the procedure. Laparoscopic surgery is an alternative to open surgery. Laparoscopic surgery has a shorter recovery time. Your common bile duct may also need to be examined during the procedure. If stones are found in the common bile duct, they may be removed. LET [facility] CARE PROVIDER KNOW ABOUT:  Any allergies you have.  All medicines you are taking, including vitamins, herbs, eye drops, creams, and over-the-counter medicines.  Previous problems you or members of your family have had with the use of anesthetics.  Any blood disorders you have.  Previous surgeries you have had.  Medical conditions you have. RISKS AND COMPLICATIONS Generally, this is a safe procedure. However, as with any procedure, complications can occur.  Possible complications include:  Infection.  Damage to the common bile duct, nerves, arteries, veins, or other internal organs such as the stomach, liver, or intestines.  Bleeding.  A stone may remain in the common bile duct.  A bile leak from the cyst duct that is clipped when your gallbladder is removed.  The need to convert to open surgery, which requires a larger incision in the abdomen. This may be necessary if your surgeon thinks it is not safe to continue with a laparoscopic procedure. BEFORE THE PROCEDURE  Ask your health care provider about changing or stopping any regular medicines. You will need to stop taking aspirin or blood thinners at least 5 days prior to surgery.  Do not eat or drink anything after midnight the night before surgery.  Let your health care provider know if you develop a cold or other infectious problem before surgery. PROCEDURE   You will be given medicine to make you sleep through the procedure (general anesthetic). A breathing tube will be placed in your mouth.  When you are asleep, your surgeon will make several small cuts (incisions) in your abdomen.  A thin, lighted tube with a tiny  camera on the end (laparoscope) is inserted through one of the small incisions. The camera on the laparoscope sends a picture to a TV screen in the operating room. This gives the surgeon a good view inside your abdomen.  A gas will be pumped into your abdomen. This expands your abdomen so that the surgeon has more room to perform the surgery.  Other tools needed for the procedure are inserted through the other incisions. The gallbladder is removed through one of the incisions.  After the removal of your gallbladder, the incisions will be closed with stitches, staples, or skin glue. AFTER THE PROCEDURE  You will be taken to a recovery area where your progress will be checked often.  You may be allowed to go home the same day if your pain is controlled and you can  tolerate liquids. Document Released: 01/01/2005 Document Revised: 10/22/2012 Document Reviewed: 08/13/2012 [facility] Patient Information 2015 [place], [place]. This information is not intended to replace advice given to you by your health care provider. Make sure you discuss any questions you have with your health care provider. PATIENT INSTRUCTIONS POST-ANESTHESIA  IMMEDIATELY FOLLOWING SURGERY:  Do not drive or operate machinery for the first twenty four hours after surgery.  Do not make any important decisions for twenty four hours after surgery or while taking narcotic pain medications or sedatives.  If you develop intractable nausea and vomiting or a severe headache please notify your doctor immediately.  FOLLOW-UP:  Please make an appointment with your surgeon as instructed. You do not need to follow up with anesthesia unless specifically instructed to do so.  WOUND CARE INSTRUCTIONS (if applicable):  Keep a dry clean dressing on the anesthesia/puncture wound site if there is drainage.  Once the wound has quit draining you may leave it open to air.  Generally you should leave the bandage intact for twenty four hours unless there is drainage.  If the epidural site drains for more than 36-48 hours please call the anesthesia department.  QUESTIONS?:  Please feel free to call your physician or the hospital operator if you have any questions, and they will be happy to assist you.

## 2014-05-21 ENCOUNTER — Encounter (HOSPITAL_COMMUNITY): Payer: Self-pay

## 2014-05-21 ENCOUNTER — Ambulatory Visit (HOSPITAL_COMMUNITY)
Admission: RE | Admit: 2014-05-21 | Discharge: 2014-05-21 | Disposition: A | Discharge status: Home / Self Care | Payer: Medicare Other | Source: Ambulatory Visit | Attending: General Surgery | Admitting: General Surgery

## 2014-05-21 ENCOUNTER — Encounter (HOSPITAL_COMMUNITY)
Admission: RE | Admit: 2014-05-21 | Discharge: 2014-05-21 | Disposition: A | Discharge status: Home / Self Care | Payer: Medicare Other | Source: Ambulatory Visit | Attending: General Surgery | Admitting: General Surgery

## 2014-05-21 DIAGNOSIS — J449 Chronic obstructive pulmonary disease, unspecified: Secondary | ICD-10-CM | POA: Diagnosis not present

## 2014-05-21 DIAGNOSIS — Z720 Tobacco use: Secondary | ICD-10-CM | POA: Insufficient documentation

## 2014-05-21 DIAGNOSIS — Z01818 Encounter for other preprocedural examination: Secondary | ICD-10-CM | POA: Diagnosis not present

## 2014-05-21 DIAGNOSIS — Z853 Personal history of malignant neoplasm of breast: Secondary | ICD-10-CM | POA: Diagnosis not present

## 2014-05-21 LAB — CBC WITH DIFFERENTIAL/PLATELET
Basophils Absolute: 0.1 10*3/uL (ref 0.0–0.1)
Basophils Relative: 1 % (ref 0–1)
EOS PCT: 3 % (ref 0–5)
Eosinophils Absolute: 0.3 10*3/uL (ref 0.0–0.7)
HCT: 45.3 % (ref 36.0–46.0)
Hemoglobin: 14.4 g/dL (ref 12.0–15.0)
LYMPHS PCT: 26 % (ref 12–46)
Lymphs Abs: 2.7 10*3/uL (ref 0.7–4.0)
MCH: 29.2 pg (ref 26.0–34.0)
MCHC: 31.8 g/dL (ref 30.0–36.0)
MCV: 91.9 fL (ref 78.0–100.0)
MONO ABS: 1.2 10*3/uL — AB (ref 0.1–1.0)
Monocytes Relative: 12 % (ref 3–12)
Neutro Abs: 6.1 10*3/uL (ref 1.7–7.7)
Neutrophils Relative %: 58 % (ref 43–77)
PLATELETS: 349 10*3/uL (ref 150–400)
RBC: 4.93 MIL/uL (ref 3.87–5.11)
RDW: 13.3 % (ref 11.5–15.5)
WBC: 10.5 10*3/uL (ref 4.0–10.5)

## 2014-05-21 LAB — BASIC METABOLIC PANEL
Anion gap: 8 (ref 5–15)
BUN: 13 mg/dL (ref 6–20)
CO2: 27 mmol/L (ref 22–32)
Calcium: 9.9 mg/dL (ref 8.9–10.3)
Chloride: 103 mmol/L (ref 101–111)
Creatinine, Ser: 0.76 mg/dL (ref 0.44–1.00)
GFR calc Af Amer: >60 mL/min (ref >60)
Glucose, Bld: 107 mg/dL — ABNORMAL HIGH (ref 70–99)
Potassium: 4.5 mmol/L (ref 3.5–5.1)
Sodium: 138 mmol/L (ref 135–145)

## 2014-05-21 LAB — HEPATIC FUNCTION PANEL
ALBUMIN: 4.3 g/dL (ref 3.5–5.0)
ALK PHOS: 56 U/L (ref 38–126)
ALT: 18 U/L (ref 14–54)
AST: 19 U/L (ref 15–41)
Bilirubin, Direct: <0.1 mg/dL — ABNORMAL LOW (ref 0.1–0.5)
TOTAL PROTEIN: 7.2 g/dL (ref 6.5–8.1)
Total Bilirubin: 0.5 mg/dL (ref 0.3–1.2)

## 2014-05-26 ENCOUNTER — Ambulatory Visit (HOSPITAL_COMMUNITY)
Admission: RE | Admit: 2014-05-26 | Discharge: 2014-05-26 | Disposition: A | Discharge status: Home / Self Care | Payer: Medicare Other | Source: Ambulatory Visit | Attending: General Surgery | Admitting: General Surgery

## 2014-05-26 ENCOUNTER — Encounter (HOSPITAL_COMMUNITY): Payer: Self-pay | Admitting: *Deleted

## 2014-05-26 ENCOUNTER — Ambulatory Visit (HOSPITAL_COMMUNITY): Payer: Medicare Other | Admitting: Anesthesiology

## 2014-05-26 ENCOUNTER — Encounter (HOSPITAL_COMMUNITY)
Admission: RE | Disposition: A | Discharge status: Home / Self Care | Payer: Self-pay | Source: Ambulatory Visit | Attending: General Surgery

## 2014-05-26 DIAGNOSIS — F17210 Nicotine dependence, cigarettes, uncomplicated: Secondary | ICD-10-CM | POA: Diagnosis not present

## 2014-05-26 DIAGNOSIS — E119 Type 2 diabetes mellitus without complications: Secondary | ICD-10-CM | POA: Diagnosis not present

## 2014-05-26 DIAGNOSIS — Z933 Colostomy status: Secondary | ICD-10-CM | POA: Insufficient documentation

## 2014-05-26 DIAGNOSIS — Z792 Long term (current) use of antibiotics: Secondary | ICD-10-CM | POA: Diagnosis not present

## 2014-05-26 DIAGNOSIS — J449 Chronic obstructive pulmonary disease, unspecified: Secondary | ICD-10-CM | POA: Insufficient documentation

## 2014-05-26 DIAGNOSIS — Z79891 Long term (current) use of opiate analgesic: Secondary | ICD-10-CM | POA: Insufficient documentation

## 2014-05-26 DIAGNOSIS — E780 Pure hypercholesterolemia: Secondary | ICD-10-CM | POA: Diagnosis not present

## 2014-05-26 DIAGNOSIS — K828 Other specified diseases of gallbladder: Secondary | ICD-10-CM | POA: Diagnosis not present

## 2014-05-26 DIAGNOSIS — Z859 Personal history of malignant neoplasm, unspecified: Secondary | ICD-10-CM | POA: Insufficient documentation

## 2014-05-26 DIAGNOSIS — Z791 Long term (current) use of non-steroidal anti-inflammatories (NSAID): Secondary | ICD-10-CM | POA: Diagnosis not present

## 2014-05-26 DIAGNOSIS — Z79899 Other long term (current) drug therapy: Secondary | ICD-10-CM | POA: Insufficient documentation

## 2014-05-26 DIAGNOSIS — K8020 Calculus of gallbladder without cholecystitis without obstruction: Secondary | ICD-10-CM | POA: Diagnosis present

## 2014-05-26 DIAGNOSIS — K8010 Calculus of gallbladder with chronic cholecystitis without obstruction: Secondary | ICD-10-CM | POA: Insufficient documentation

## 2014-05-26 DIAGNOSIS — I10 Essential (primary) hypertension: Secondary | ICD-10-CM | POA: Diagnosis not present

## 2014-05-26 DIAGNOSIS — Z9049 Acquired absence of other specified parts of digestive tract: Secondary | ICD-10-CM | POA: Insufficient documentation

## 2014-05-26 HISTORY — PX: CHOLECYSTECTOMY: SHX55

## 2014-05-26 LAB — GLUCOSE, CAPILLARY: Glucose-Capillary: 121 mg/dL — ABNORMAL HIGH (ref 70–99)

## 2014-05-26 SURGERY — LAPAROSCOPIC CHOLECYSTECTOMY
Anesthesia: General | Site: Abdomen

## 2014-05-26 MED ORDER — LIDOCAINE HCL (PF) 1 % IJ SOLN
INTRAMUSCULAR | Status: AC
  Filled 2014-05-26: qty 5

## 2014-05-26 MED ORDER — ONDANSETRON HCL 4 MG/2ML IJ SOLN: 4.0000 mg | Freq: Once | INTRAMUSCULAR | Status: DC | PRN

## 2014-05-26 MED ORDER — DEXAMETHASONE SODIUM PHOSPHATE 4 MG/ML IJ SOLN
4.0000 mg | Freq: Once | INTRAMUSCULAR | Status: AC
  Administered 2014-05-26: 4 mg via INTRAVENOUS

## 2014-05-26 MED ORDER — CIPROFLOXACIN IN D5W 400 MG/200ML IV SOLN
400.0000 mg | INTRAVENOUS | Status: AC
  Administered 2014-05-26: 400 mg via INTRAVENOUS

## 2014-05-26 MED ORDER — MIDAZOLAM HCL 2 MG/2ML IJ SOLN
1.0000 mg | INTRAMUSCULAR | Status: DC | PRN
  Administered 2014-05-26: 2 mg via INTRAVENOUS

## 2014-05-26 MED ORDER — GLYCOPYRROLATE 0.2 MG/ML IJ SOLN
INTRAMUSCULAR | Status: AC
Start: 2014-05-26 — End: 2014-05-26
  Filled 2014-05-26: qty 7

## 2014-05-26 MED ORDER — POVIDONE-IODINE 10 % OINT PACKET
TOPICAL_OINTMENT | CUTANEOUS | Status: DC | PRN
Start: 2014-05-26 — End: 2014-05-26
  Administered 2014-05-26: 1 via TOPICAL

## 2014-05-26 MED ORDER — FENTANYL CITRATE (PF) 250 MCG/5ML IJ SOLN
INTRAMUSCULAR | Status: AC
  Filled 2014-05-26: qty 5

## 2014-05-26 MED ORDER — SODIUM CHLORIDE 0.9 % IJ SOLN
INTRAMUSCULAR | Status: AC
  Filled 2014-05-26: qty 10

## 2014-05-26 MED ORDER — BUPIVACAINE HCL (PF) 0.5 % IJ SOLN
INTRAMUSCULAR | Status: DC | PRN
  Administered 2014-05-26: 10 mL

## 2014-05-26 MED ORDER — BUPIVACAINE HCL (PF) 0.5 % IJ SOLN
INTRAMUSCULAR | Status: AC
  Filled 2014-05-26: qty 30

## 2014-05-26 MED ORDER — PROPOFOL 10 MG/ML IV BOLUS
INTRAVENOUS | Status: AC
  Filled 2014-05-26: qty 20

## 2014-05-26 MED ORDER — SODIUM CHLORIDE 0.9 % IR SOLN
Status: DC | PRN
  Administered 2014-05-26: 500 mL

## 2014-05-26 MED ORDER — ONDANSETRON HCL 4 MG/2ML IJ SOLN
4.0000 mg | Freq: Once | INTRAMUSCULAR | Status: AC
  Administered 2014-05-26: 4 mg via INTRAVENOUS

## 2014-05-26 MED ORDER — DEXAMETHASONE SODIUM PHOSPHATE 4 MG/ML IJ SOLN
INTRAMUSCULAR | Status: AC
  Filled 2014-05-26: qty 1

## 2014-05-26 MED ORDER — CHLORHEXIDINE GLUCONATE 4 % EX LIQD: 1.0000 "application " | Freq: Once | CUTANEOUS | Status: DC

## 2014-05-26 MED ORDER — MIDAZOLAM HCL 2 MG/2ML IJ SOLN
INTRAMUSCULAR | Status: AC
  Filled 2014-05-26: qty 2

## 2014-05-26 MED ORDER — ROCURONIUM BROMIDE 50 MG/5ML IV SOLN
INTRAVENOUS | Status: AC
  Filled 2014-05-26: qty 1

## 2014-05-26 MED ORDER — GLYCOPYRROLATE 0.2 MG/ML IJ SOLN
INTRAMUSCULAR | Status: DC | PRN
  Administered 2014-05-26: .5 mg via INTRAVENOUS

## 2014-05-26 MED ORDER — NEOSTIGMINE METHYLSULFATE 10 MG/10ML IV SOLN
INTRAVENOUS | Status: DC | PRN
  Administered 2014-05-26: 3 mg via INTRAVENOUS

## 2014-05-26 MED ORDER — HYDROCODONE-ACETAMINOPHEN 7.5-325 MG PO TABS: 1.0000 | ORAL_TABLET | ORAL | Status: DC | PRN

## 2014-05-26 MED ORDER — FENTANYL CITRATE (PF) 100 MCG/2ML IJ SOLN
25.0000 ug | INTRAMUSCULAR | Status: DC | PRN
  Administered 2014-05-26 (×3): 50 ug via INTRAVENOUS
  Filled 2014-05-26 (×2): qty 2

## 2014-05-26 MED ORDER — CIPROFLOXACIN IN D5W 400 MG/200ML IV SOLN
INTRAVENOUS | Status: AC
  Filled 2014-05-26: qty 200

## 2014-05-26 MED ORDER — PHENYLEPHRINE HCL 10 MG/ML IJ SOLN
INTRAMUSCULAR | Status: DC | PRN
  Administered 2014-05-26 (×2): 50 ug via INTRAVENOUS

## 2014-05-26 MED ORDER — HEMOSTATIC AGENTS (NO CHARGE) OPTIME
TOPICAL | Status: DC | PRN
  Administered 2014-05-26: 1 via TOPICAL

## 2014-05-26 MED ORDER — LACTATED RINGERS IV SOLN
INTRAVENOUS | Status: DC
  Administered 2014-05-26: 1000 mL via INTRAVENOUS

## 2014-05-26 MED ORDER — LIDOCAINE HCL 1 % IJ SOLN
INTRAMUSCULAR | Status: DC | PRN
  Administered 2014-05-26: 50 mg via INTRADERMAL

## 2014-05-26 MED ORDER — NEOSTIGMINE METHYLSULFATE 10 MG/10ML IV SOLN
INTRAVENOUS | Status: AC
  Filled 2014-05-26: qty 1

## 2014-05-26 MED ORDER — POVIDONE-IODINE 10 % EX OINT
TOPICAL_OINTMENT | CUTANEOUS | Status: AC
  Filled 2014-05-26: qty 1

## 2014-05-26 MED ORDER — GLYCOPYRROLATE 0.2 MG/ML IJ SOLN
0.2000 mg | Freq: Once | INTRAMUSCULAR | Status: AC
  Administered 2014-05-26: 0.2 mg via INTRAVENOUS

## 2014-05-26 MED ORDER — GLYCOPYRROLATE 0.2 MG/ML IJ SOLN
INTRAMUSCULAR | Status: AC
  Filled 2014-05-26: qty 1

## 2014-05-26 MED ORDER — ONDANSETRON HCL 4 MG/2ML IJ SOLN
INTRAMUSCULAR | Status: AC
  Filled 2014-05-26: qty 2

## 2014-05-26 MED ORDER — SUCCINYLCHOLINE CHLORIDE 20 MG/ML IJ SOLN
INTRAMUSCULAR | Status: AC
  Filled 2014-05-26: qty 1

## 2014-05-26 MED ORDER — PHENYLEPHRINE HCL 10 MG/ML IJ SOLN
INTRAMUSCULAR | Status: AC
  Filled 2014-05-26: qty 1

## 2014-05-26 MED ORDER — FENTANYL CITRATE (PF) 250 MCG/5ML IJ SOLN
INTRAMUSCULAR | Status: DC | PRN
  Administered 2014-05-26 (×4): 50 ug via INTRAVENOUS

## 2014-05-26 SURGICAL SUPPLY — 44 items
APPLIER CLIP LAPSCP 10X32 DD (CLIP) ×3 IMPLANT
BAG HAMPER (MISCELLANEOUS) ×3 IMPLANT
BAG SPEC RTRVL LRG 6X4 10 (ENDOMECHANICALS) ×1
CHLORAPREP W/TINT 26ML (MISCELLANEOUS) ×3 IMPLANT
CLOTH BEACON ORANGE TIMEOUT ST (SAFETY) ×3 IMPLANT
COVER LIGHT HANDLE STERIS (MISCELLANEOUS) ×6 IMPLANT
DECANTER SPIKE VIAL GLASS SM (MISCELLANEOUS) ×3 IMPLANT
ELECT REM PT RETURN 9FT ADLT (ELECTROSURGICAL) ×3
ELECTRODE REM PT RTRN 9FT ADLT (ELECTROSURGICAL) ×1 IMPLANT
FILTER SMOKE EVAC LAPAROSHD (FILTER) ×3 IMPLANT
FORMALIN 10 PREFIL 120ML (MISCELLANEOUS) ×3 IMPLANT
GLOVE ECLIPSE 6.5 STRL STRAW (GLOVE) ×2 IMPLANT
GLOVE INDICATOR 7.0 STRL GRN (GLOVE) ×4 IMPLANT
GLOVE SS BIOGEL STRL SZ 6.5 (GLOVE) IMPLANT
GLOVE SUPERSENSE BIOGEL SZ 6.5 (GLOVE) ×2
GLOVE SURG SS PI 7.5 STRL IVOR (GLOVE) ×3 IMPLANT
GOWN STRL REUS W/ TWL XL LVL3 (GOWN DISPOSABLE) ×1 IMPLANT
GOWN STRL REUS W/TWL LRG LVL3 (GOWN DISPOSABLE) ×6 IMPLANT
GOWN STRL REUS W/TWL XL LVL3 (GOWN DISPOSABLE) ×3
HEMOSTAT SNOW SURGICEL 2X4 (HEMOSTASIS) ×3 IMPLANT
INST SET LAPROSCOPIC AP (KITS) ×3 IMPLANT
IV NS IRRIG 3000ML ARTHROMATIC (IV SOLUTION) IMPLANT
KIT ROOM TURNOVER APOR (KITS) ×3 IMPLANT
MANIFOLD NEPTUNE II (INSTRUMENTS) ×3 IMPLANT
NDL INSUFFLATION 14GA 120MM (NEEDLE) ×1 IMPLANT
NEEDLE INSUFFLATION 14GA 120MM (NEEDLE) ×3 IMPLANT
NS IRRIG 1000ML POUR BTL (IV SOLUTION) ×3 IMPLANT
PACK LAP CHOLE LZT030E (CUSTOM PROCEDURE TRAY) ×3 IMPLANT
PAD ARMBOARD 7.5X6 YLW CONV (MISCELLANEOUS) ×3 IMPLANT
POUCH SPECIMEN RETRIEVAL 10MM (ENDOMECHANICALS) ×3 IMPLANT
SET BASIN LINEN APH (SET/KITS/TRAYS/PACK) ×3 IMPLANT
SET TUBE IRRIG SUCTION NO TIP (IRRIGATION / IRRIGATOR) IMPLANT
SLEEVE ENDOPATH XCEL 5M (ENDOMECHANICALS) ×3 IMPLANT
SPONGE GAUZE 2X2 8PLY STER LF (GAUZE/BANDAGES/DRESSINGS) ×4
SPONGE GAUZE 2X2 8PLY STRL LF (GAUZE/BANDAGES/DRESSINGS) ×8 IMPLANT
STAPLER VISISTAT (STAPLE) ×3 IMPLANT
SUT VICRYL 0 UR6 27IN ABS (SUTURE) ×3 IMPLANT
TAPE CLOTH SURG 4X10 WHT LF (GAUZE/BANDAGES/DRESSINGS) ×2 IMPLANT
TROCAR ENDO BLADELESS 11MM (ENDOMECHANICALS) ×3 IMPLANT
TROCAR XCEL NON-BLD 5MMX100MML (ENDOMECHANICALS) ×3 IMPLANT
TROCAR XCEL UNIV SLVE 11M 100M (ENDOMECHANICALS) ×3 IMPLANT
TUBING INSUFFLATION (TUBING) ×3 IMPLANT
WARMER LAPAROSCOPE (MISCELLANEOUS) ×3 IMPLANT
YANKAUER SUCT 12FT TUBE ARGYLE (SUCTIONS) ×3 IMPLANT

## 2014-05-26 NOTE — Interval H&P Note (Signed)
History and Physical Interval Note:  05/26/2014 8:44 AM  [name]  has presented today for surgery, with the diagnosis of cholelithiasis  The various methods of treatment have been discussed with the patient and family. After consideration of risks, benefits and other options for treatment, the patient has consented to  Procedure(s) with comments: LAPAROSCOPIC CHOLECYSTECTOMY (N/A) - possible open as a surgical intervention .  The patient's history has been reviewed, patient examined, no change in status, stable for surgery.  I have reviewed the patient's chart and labs.  Questions were answered to the patient's satisfaction.     Aviva Signs A

## 2014-05-26 NOTE — Discharge Instructions (Signed)

## 2014-05-26 NOTE — Transfer of Care (Signed)
Immediate Anesthesia Transfer of Care Note  Patient: [name]  Procedure(s) Performed: Procedure(s): LAPAROSCOPIC CHOLECYSTECTOMY (N/A)  Patient Location: PACU  Anesthesia Type:General  Level of Consciousness: awake, alert  and oriented  Airway & Oxygen Therapy: Patient Spontanous Breathing and Patient connected to face mask oxygen  Post-op Assessment: Report given to RN  Post vital signs: Reviewed and stable  Last Vitals:  Filed Vitals:   05/26/14 0845  BP: 106/65  Pulse:   Temp:   Resp: 19    Complications: No apparent anesthesia complications

## 2014-05-26 NOTE — Anesthesia Preprocedure Evaluation (Signed)
Anesthesia Evaluation  Patient identified by MRN, date of birth, ID band Patient awake    Reviewed: Allergy & Precautions, NPO status , Patient's Chart, lab work & pertinent test results  Airway Mallampati: I  TM Distance: >3 FB     Dental  (+) Edentulous Upper, Partial Lower, Dental Advisory Given   Pulmonary COPDCurrent Smoker,  breath sounds clear to auscultation        Cardiovascular hypertension, Pt. on medications Rhythm:Regular Rate:Normal     Neuro/Psych PSYCHIATRIC DISORDERS Anxiety    GI/Hepatic   Endo/Other  diabetes, Type 2, Oral Hypoglycemic Agents  Renal/GU Renal disease     Musculoskeletal   Abdominal   Peds  Hematology   Anesthesia Other Findings   Reproductive/Obstetrics                             Anesthesia Physical Anesthesia Plan  ASA: III  Anesthesia Plan: General   Post-op Pain Management:    Induction: Intravenous  Airway Management Planned: Oral ETT  Additional Equipment:   Intra-op Plan:   Post-operative Plan: Extubation in OR  Informed Consent: I have reviewed the patients History and Physical, chart, labs and discussed the procedure including the risks, benefits and alternatives for the proposed anesthesia with the patient or authorized representative who has indicated his/her understanding and acceptance.     Plan Discussed with:   Anesthesia Plan Comments:         Anesthesia Quick Evaluation

## 2014-05-26 NOTE — Anesthesia Postprocedure Evaluation (Signed)
  Anesthesia Post-op Note  Patient: [name]  Procedure(s) Performed: Procedure(s): LAPAROSCOPIC CHOLECYSTECTOMY (N/A)  Patient Location: PACU  Anesthesia Type:General  Level of Consciousness: awake, alert  and oriented  Airway and Oxygen Therapy: Patient Spontanous Breathing and Patient connected to face mask oxygen  Post-op Pain: mild  Post-op Assessment: Post-op Vital signs reviewed, Patient's Cardiovascular Status Stable, Respiratory Function Stable, Patent Airway and No signs of Nausea or vomiting  Post-op Vital Signs: Reviewed and stable  Last Vitals:  Filed Vitals:   05/26/14 0845  BP: 106/65  Pulse:   Temp:   Resp: 19    Complications: No apparent anesthesia complications

## 2014-05-26 NOTE — Op Note (Signed)
Patient:  [name]  DOB:  11/23/1945  MRN:  [id number]   Preop Diagnosis:  Cholecystitis, cholelithiasis  Postop Diagnosis:  Same  Procedure:  Laparoscopic cholecystectomy  Surgeon:  Aviva Signs, M.D.  Anes:  Gen. endotracheal  Indications:  Patient is a 69 year old white female who presents with biliary colic secondary to cholelithiasis. The risks and benefits of the procedure including bleeding, infection, hepatobiliary injury, and the possibility of an open procedure were fully explained to the patient, who gave informed consent.  Procedure note:  The patient was placed the supine position. After induction of general endotracheal anesthesia, the abdomen was prepped and draped using usual sterile technique with DuraPrep. Surgical site confirmation was performed.  A supraumbilical incision was made down to the fascia. A Veress needle was introduced into the abdominal cavity and confirmation of placement was done using the saline drop test. The abdomen was then insufflated to 16 mmHg pressure. 11 mm trocar was introduced into the abdominal cavity under direct visualization without difficulty. The patient was placed in reverse Trendelenburg position and an additional 11 mm trocar was placed the epigastric region and 5 mm trochars were placed in the right upper quadrant and right flank regions. Liver was inspected and noted within normal limits. The gallbladder had some adhesions of bowel and omentum attached to it. These were freed away bluntly without difficulty. The gallbladder was retracted in a dynamic fashion in order to expose the triangle of Calot. The cystic duct was first identified. Its juncture to the infundibulum was fully identified. Endoclips were placed proximally and distally on the cystic duct, and the cystic duct was divided. This was likewise done cystic artery. The gallbladder was freed away from the gallbladder fossa using Bovie electrocautery. The gallbladder was  delivered to the trocar site using an Endo Catch bag. The gallbladder fossa was inspected and no abnormal bleeding or bile leakage was noted. Surgicel is placed the gallbladder fossa. All fluid and air were then evacuated from the abdominal cavity prior to removal of the trochars.  All wounds were irrigated with normal saline. All wounds were injected with 0.5% Sensorcaine. The supraumbilical fascia as well as epigastric fascia reapproximated using 0 Vicryl interrupted sutures. All skin incisions were closed using staples. Betadine ointment and dry sterile dressings were applied.  All tape and needle counts were correct the end of the procedure. Patient was extubated in the operating room and transferred to PACU in stable condition.  Complications:  None  EBL:  Minimal  Specimen:  Gallbladder

## 2014-05-26 NOTE — Anesthesia Procedure Notes (Signed)
Procedure Name: Intubation Date/Time: 05/26/2014 8:59 AM Performed by: Tressie Stalker E Pre-anesthesia Checklist: Patient identified, Patient being monitored, Timeout performed, Emergency Drugs available and Suction available Patient Re-evaluated:Patient Re-evaluated prior to inductionOxygen Delivery Method: Circle System Utilized Preoxygenation: Pre-oxygenation with 100% oxygen Intubation Type: IV induction Ventilation: Mask ventilation without difficulty Laryngoscope Size: Mac and 3 Grade View: Grade I Tube type: Oral Tube size: 7.0 mm Number of attempts: 1 Airway Equipment and Method: Stylet Placement Confirmation: ETT inserted through vocal cords under direct vision,  positive ETCO2 and breath sounds checked- equal and bilateral Secured at: 21 cm Tube secured with: Tape Dental Injury: Teeth and Oropharynx as per pre-operative assessment

## 2014-05-27 ENCOUNTER — Telehealth: Payer: Self-pay | Admitting: *Deleted

## 2014-05-27 ENCOUNTER — Other Ambulatory Visit: Payer: Self-pay

## 2014-05-27 ENCOUNTER — Encounter: Payer: Medicare Other | Admitting: Family Medicine

## 2014-05-27 MED ORDER — BENZONATATE 100 MG PO CAPS: 100.0000 mg | ORAL_CAPSULE | Freq: Three times a day (TID) | ORAL | Status: AC | PRN

## 2014-05-27 NOTE — Telephone Encounter (Signed)
Pt called requesting her coughing pill to be refilled. Please advise

## 2014-05-27 NOTE — Telephone Encounter (Signed)
Tessalon perles refilled.

## 2014-05-28 ENCOUNTER — Encounter (HOSPITAL_COMMUNITY): Payer: Self-pay | Admitting: General Surgery

## 2014-05-31 ENCOUNTER — Other Ambulatory Visit: Payer: Self-pay | Admitting: Family Medicine

## 2014-06-22 ENCOUNTER — Other Ambulatory Visit: Payer: Self-pay | Admitting: Family Medicine

## 2014-06-24 ENCOUNTER — Other Ambulatory Visit: Payer: Self-pay | Admitting: Family Medicine

## 2014-06-27 ENCOUNTER — Encounter: Payer: Self-pay | Admitting: Family Medicine

## 2014-06-27 NOTE — Assessment & Plan Note (Signed)
Improving , trying to quit Patient counseled for approximately 5 minutes regarding the health risks of ongoing nicotine use, specifically all types of cancer, heart disease, stroke and respiratory failure. The options available for help with cessation ,the behavioral changes to assist the process, and the option to either gradully reduce usage  Or abruptly stop.is also discussed. Pt is also encouraged to set specific goals in number of cigarettes used daily, as well as to set a quit date.  Number of cigarettes/cigars currently smoking daily: 3

## 2014-06-27 NOTE — Assessment & Plan Note (Signed)
Improving, pt to complete antibiotic course and additional prednisone 5 mg dose pack is prescribed

## 2014-06-27 NOTE — Assessment & Plan Note (Signed)
Controlled, no change in medication  

## 2014-06-27 NOTE — Assessment & Plan Note (Signed)
Controlled, no change in medication Patient educated about the importance of limiting  Carbohydrate intake , the need to commit to daily physical activity for a minimum of 30 minutes , and to commit weight loss.  Diabetic Labs Latest Ref Rng 05/21/2014 05/05/2014 01/18/2014 09/25/2013 09/11/2013  HbA1c <5.7 % - - 6.0(H) - -  Microalbumin <2.0 mg/dL - 4.2(H) - - -  Micro/Creat Ratio 0.0 - 30.0 mg/g - 84.3(H) - - -  Chol 0 - 200 mg/dL - - - 159 -  HDL >39 mg/dL - - - 54 -  Calc LDL 0 - 99 mg/dL - - - 66 -  Triglycerides <150 mg/dL - - - 194(H) -  Creatinine 0.44 - 1.00 mg/dL 0.76 - 0.71 0.73 0.83   BP/Weight 05/26/2014 05/21/2014 05/05/2014 05/02/2014 04/05/2014 0/35/0093 08/15/8297  Systolic BP 371 696 789 381 017 510 258  Diastolic BP 48 68 80 66 58 85 78  Wt. (Lbs) 161 161 168 158 160.2 140 153  BMI 26.79 26.79 27.96 26.29 26.66 23.3 27.11   Foot/eye exam completion dates Latest Ref Rng 06/10/2013 04/21/2013  Eye Exam No Retinopathy - No Retinopathy  Foot Form Completion - Done -

## 2014-06-27 NOTE — Assessment & Plan Note (Signed)
Antibiotic and decongestant prescribed 

## 2014-06-27 NOTE — Assessment & Plan Note (Signed)
UnControlled, no change in medication, elevated TG  Updated lab needed at/ before next visit.   Lipid Panel  Lab Results  Component Value Date   CHOL 159 09/25/2013   HDL 54 09/25/2013   LDLCALC 66 09/25/2013   TRIG 194* 09/25/2013   CHOLHDL 2.9 09/25/2013

## 2014-06-27 NOTE — Assessment & Plan Note (Signed)
Deteriorating due to ongoing nicotine use, pt however is now motivated and trying to quit

## 2014-06-27 NOTE — Assessment & Plan Note (Signed)
Patient educated about the importance of limiting  Carbohydrate intake , the need to commit to daily physical activity for a minimum of 30 minutes , and to commit weight loss. Updated lab needed at/ before next visit.  Diabetic Labs Latest Ref Rng 05/21/2014 05/05/2014 01/18/2014 09/25/2013 09/11/2013  HbA1c <5.7 % - - 6.0(H) - -  Microalbumin <2.0 mg/dL - 4.2(H) - - -  Micro/Creat Ratio 0.0 - 30.0 mg/g - 84.3(H) - - -  Chol 0 - 200 mg/dL - - - 159 -  HDL >39 mg/dL - - - 54 -  Calc LDL 0 - 99 mg/dL - - - 66 -  Triglycerides <150 mg/dL - - - 194(H) -  Creatinine 0.44 - 1.00 mg/dL 0.76 - 0.71 0.73 0.83   BP/Weight 05/26/2014 05/21/2014 05/05/2014 05/02/2014 04/05/2014 1/54/0086 07/20/1949  Systolic BP 932 671 245 809 983 382 505  Diastolic BP 48 68 80 66 58 85 78  Wt. (Lbs) 161 161 168 158 160.2 140 153  BMI 26.79 26.79 27.96 26.29 26.66 23.3 27.11   Foot/eye exam completion dates Latest Ref Rng 06/10/2013 04/21/2013  Eye Exam No Retinopathy - No Retinopathy  Foot Form Completion - Done -

## 2014-06-30 ENCOUNTER — Ambulatory Visit (INDEPENDENT_AMBULATORY_CARE_PROVIDER_SITE_OTHER): Payer: Medicare Other | Admitting: Family Medicine

## 2014-06-30 ENCOUNTER — Encounter: Payer: Self-pay | Admitting: Family Medicine

## 2014-06-30 VITALS — BP 150/80 | HR 94 | Resp 16 | Ht 64.0 in | Wt 156.0 lb

## 2014-06-30 DIAGNOSIS — E785 Hyperlipidemia, unspecified: Secondary | ICD-10-CM

## 2014-06-30 DIAGNOSIS — J209 Acute bronchitis, unspecified: Secondary | ICD-10-CM | POA: Insufficient documentation

## 2014-06-30 DIAGNOSIS — Z720 Tobacco use: Secondary | ICD-10-CM | POA: Diagnosis not present

## 2014-06-30 DIAGNOSIS — F17210 Nicotine dependence, cigarettes, uncomplicated: Secondary | ICD-10-CM

## 2014-06-30 DIAGNOSIS — R300 Dysuria: Secondary | ICD-10-CM | POA: Diagnosis not present

## 2014-06-30 DIAGNOSIS — J440 Chronic obstructive pulmonary disease with acute lower respiratory infection: Secondary | ICD-10-CM | POA: Insufficient documentation

## 2014-06-30 DIAGNOSIS — I10 Essential (primary) hypertension: Secondary | ICD-10-CM

## 2014-06-30 DIAGNOSIS — M6740 Ganglion, unspecified site: Secondary | ICD-10-CM

## 2014-06-30 DIAGNOSIS — F17200 Nicotine dependence, unspecified, uncomplicated: Secondary | ICD-10-CM

## 2014-06-30 LAB — POCT URINALYSIS DIPSTICK
BILIRUBIN UA: NEGATIVE
Glucose, UA: NEGATIVE
Ketones, UA: NEGATIVE
Leukocytes, UA: NEGATIVE
NITRITE UA: NEGATIVE
PH UA: 6.5
PROTEIN UA: NEGATIVE
RBC UA: NEGATIVE
Spec Grav, UA: 1.01
Urobilinogen, UA: 0.2

## 2014-06-30 MED ORDER — BENZONATATE 100 MG PO CAPS: 100.0000 mg | ORAL_CAPSULE | Freq: Two times a day (BID) | ORAL | Status: DC

## 2014-06-30 MED ORDER — DOXYCYCLINE HYCLATE 100 MG PO TABS: 100.0000 mg | ORAL_TABLET | Freq: Two times a day (BID) | ORAL | Status: DC

## 2014-06-30 NOTE — Assessment & Plan Note (Signed)
Unchanged Patient counseled for approximately 5 minutes regarding the health risks of ongoing nicotine use, specifically all types of cancer, heart disease, stroke and respiratory failure. The options available for help with cessation ,the behavioral changes to assist the process, and the option to either gradully reduce usage  Or abruptly stop.is also discussed. Pt is also encouraged to set specific goals in number of cigarettes used daily, as well as to set a quit date.  Number of cigarettes/cigars currently smoking daily: 10 to 15

## 2014-06-30 NOTE — Patient Instructions (Signed)
F/u as before  Cyst on right foot can be reviewed by Dr Luna Glasgow , I do not think there is a reason at this time for surgery  No evidence of urinary tract infection  Two meds sent in for bronchitis  Please work on cutting back on cigarettes, you need to [employer]

## 2014-06-30 NOTE — Assessment & Plan Note (Addendum)
1 week ho slight burning , UA is negative for  infection or abnormality

## 2014-07-07 ENCOUNTER — Other Ambulatory Visit: Payer: Self-pay | Admitting: Family Medicine

## 2014-07-23 NOTE — Progress Notes (Signed)
[name]     MRN: [id number]      DOB: 07/05/1945   HPI [name] is here for follow up and re-evaluation of chronic medical conditions, medication management and review of any available recent lab and radiology data.  Preventive health is updated, specifically  Cancer screening and Immunization.   Has had successful, and uneventful cholecystectomy The PT denies any adverse reactions to current medications since the last visit.  1 week h/o increased chest congestion and cough with thick cream sputum, denies sinus pressure , ear pain , or sore throat, has had chills, no documented fever C/o increased burning and frequency of urination in past 3 days  ROS Denies recent fever or chills. Denies sinus pressure, nasal congestion, ear pain or sore throat. ng. Denies chest pains, palpitations and leg swelling Denies abdominal pain, nausea, vomiting,diarrhea or constipation.   Chronic  joint pain, swelling and  Minor limitation in mobility. Denies headaches, seizures, numbness, or tingling. Denies depression, anxiety or insomnia. Denies skin break down or rash.   PE  BP 150/80 mmHg  Pulse 94  Resp 16  Ht 5\' 4"  (1.626 m)  Wt 156 lb (70.761 kg)  BMI 26.76 kg/m2  SpO2 96%  Patient alert and oriented and in no cardiopulmonary distress.  HEENT: No facial asymmetry, EOMI,   oropharynx pink and moist.  Neck supple no JVD, no mass.  Chest: decreased air entry ,  Scattered crackles and few wheezes  CVS: S1, S2 no murmurs, no S3.Regular rate.  ABD: Soft non tender.   Ext: No edema  MS: Adequate ROM spine, shoulders, hips and knees.Cyst on dorsum of right foot, non tender , less than 3 cm diameter  Skin: Intact, no ulcerations or rash noted.  Psych: Good eye contact, normal affect. Memory intact not anxious or depressed appearing.  CNS: CN 2-12 intact, power,  normal throughout.no focal deficits noted.   Assessment & Plan   Dysuria 1 week ho slight burning , UA is  negative for  infection or abnormality  NICOTINE ADDICTION Unchanged Patient counseled for approximately 5 minutes regarding the health risks of ongoing nicotine use, specifically all types of cancer, heart disease, stroke and respiratory failure. The options available for help with cessation ,the behavioral changes to assist the process, and the option to either gradully reduce usage  Or abruptly stop.is also discussed. Pt is also encouraged to set specific goals in number of cigarettes used daily, as well as to set a quit date.  Number of cigarettes/cigars currently smoking daily: 10 to 15   Acute bronchitis Antibiotic and decongestant prescribed  Hypertension goal BP (blood pressure) < 130/80 Uncontrolled, however , has recently been using an excessive amt decongestants, no med c hange at this time DASH diet and commitment to daily physical activity for a minimum of 30 minutes discussed and encouraged, as a part of hypertension management. The importance of attaining a healthy weight is also discussed.  BP/Weight 06/30/2014 05/26/2014 05/21/2014 05/05/2014 05/02/2014 04/05/2014 04/23/8117  Systolic BP 147 829 562 130 865 784 696  Diastolic BP 80 48 68 80 66 58 85  Wt. (Lbs) 156 161 161 168 158 160.2 140  BMI 26.76 26.79 26.79 27.96 26.29 26.66 23.3        Ganglion cyst Concerned about cyst on right foot, which is less than 3 cm in diameter, has upcoming appt with her ortho and I advise she discuss with him at that visit, watchful management only needed at this time  Hyperlipidemia LDL goal <100 Hyperlipidemia:Low fat diet discussed and encouraged.   Lipid Panel  Lab Results  Component Value Date   CHOL 159 09/25/2013   HDL 54 09/25/2013   LDLCALC 66 09/25/2013   TRIG 194* 09/25/2013   CHOLHDL 2.9 09/25/2013    Needs to reduce fat intake du to elevated TG Updated lab needed at/ before next visit.

## 2014-07-23 NOTE — Assessment & Plan Note (Signed)
Concerned about cyst on right foot, which is less than 3 cm in diameter, has upcoming appt with her ortho and I advise she discuss with him at that visit, watchful management only needed at this time

## 2014-07-23 NOTE — Assessment & Plan Note (Signed)
Uncontrolled, however , has recently been using an excessive amt decongestants, no med c hange at this time DASH diet and commitment to daily physical activity for a minimum of 30 minutes discussed and encouraged, as a part of hypertension management. The importance of attaining a healthy weight is also discussed.  BP/Weight 06/30/2014 05/26/2014 05/21/2014 05/05/2014 05/02/2014 04/05/2014 3/81/0175  Systolic BP 102 585 277 824 235 361 443  Diastolic BP 80 48 68 80 66 58 85  Wt. (Lbs) 156 161 161 168 158 160.2 140  BMI 26.76 26.79 26.79 27.96 26.29 26.66 23.3

## 2014-07-23 NOTE — Assessment & Plan Note (Signed)
Hyperlipidemia:Low fat diet discussed and encouraged.   Lipid Panel  Lab Results  Component Value Date   CHOL 159 09/25/2013   HDL 54 09/25/2013   LDLCALC 66 09/25/2013   TRIG 194* 09/25/2013   CHOLHDL 2.9 09/25/2013    Needs to reduce fat intake du to elevated TG Updated lab needed at/ before next visit.

## 2014-07-23 NOTE — Assessment & Plan Note (Signed)
Antibiotic and decongestant prescribed 

## 2014-07-26 ENCOUNTER — Other Ambulatory Visit: Payer: Self-pay | Admitting: Family Medicine

## 2014-08-02 ENCOUNTER — Other Ambulatory Visit: Payer: Self-pay | Admitting: Family Medicine

## 2014-08-12 DIAGNOSIS — Z681 Body mass index (BMI) 19 or less, adult: Secondary | ICD-10-CM | POA: Diagnosis not present

## 2014-08-12 DIAGNOSIS — M25561 Pain in right knee: Secondary | ICD-10-CM | POA: Diagnosis not present

## 2014-08-12 DIAGNOSIS — Z8719 Personal history of other diseases of the digestive system: Secondary | ICD-10-CM | POA: Diagnosis not present

## 2014-08-12 DIAGNOSIS — I10 Essential (primary) hypertension: Secondary | ICD-10-CM | POA: Diagnosis not present

## 2014-08-12 DIAGNOSIS — M25511 Pain in right shoulder: Secondary | ICD-10-CM | POA: Diagnosis not present

## 2014-08-27 ENCOUNTER — Other Ambulatory Visit: Payer: Self-pay

## 2014-08-27 DIAGNOSIS — E785 Hyperlipidemia, unspecified: Secondary | ICD-10-CM | POA: Diagnosis not present

## 2014-08-27 DIAGNOSIS — I10 Essential (primary) hypertension: Secondary | ICD-10-CM | POA: Diagnosis not present

## 2014-08-27 DIAGNOSIS — E1169 Type 2 diabetes mellitus with other specified complication: Secondary | ICD-10-CM

## 2014-08-27 DIAGNOSIS — E669 Obesity, unspecified: Secondary | ICD-10-CM

## 2014-08-27 DIAGNOSIS — E119 Type 2 diabetes mellitus without complications: Secondary | ICD-10-CM | POA: Diagnosis not present

## 2014-08-27 LAB — COMPREHENSIVE METABOLIC PANEL
ALT: 16 U/L (ref 6–29)
AST: 18 U/L (ref 10–35)
Albumin: 4.6 g/dL (ref 3.6–5.1)
Alkaline Phosphatase: 71 U/L (ref 33–130)
BILIRUBIN TOTAL: 0.4 mg/dL (ref 0.2–1.2)
BUN: 12 mg/dL (ref 7–25)
CO2: 24 mmol/L (ref 20–31)
Calcium: 10.2 mg/dL (ref 8.6–10.4)
Chloride: 99 mmol/L (ref 98–110)
Creat: 0.73 mg/dL (ref 0.50–0.99)
Glucose, Bld: 75 mg/dL (ref 65–99)
Potassium: 5.4 mmol/L — ABNORMAL HIGH (ref 3.5–5.3)
SODIUM: 140 mmol/L (ref 135–146)
Total Protein: 7.3 g/dL (ref 6.1–8.1)

## 2014-08-27 LAB — HEMOGLOBIN A1C
Hgb A1c MFr Bld: 6 % — ABNORMAL HIGH (ref <5.7)
Mean Plasma Glucose: 126 mg/dL — ABNORMAL HIGH (ref <117)

## 2014-08-27 LAB — LIPID PANEL
Cholesterol: 198 mg/dL (ref 125–200)
HDL: 69 mg/dL (ref >=46)
LDL CALC: 90 mg/dL (ref <130)
Total CHOL/HDL Ratio: 2.9 Ratio (ref <=5.0)
Triglycerides: 193 mg/dL — ABNORMAL HIGH (ref <150)
VLDL: 39 mg/dL — ABNORMAL HIGH (ref <30)

## 2014-08-31 ENCOUNTER — Ambulatory Visit (INDEPENDENT_AMBULATORY_CARE_PROVIDER_SITE_OTHER): Payer: Medicare Other | Admitting: Family Medicine

## 2014-08-31 ENCOUNTER — Encounter: Payer: Self-pay | Admitting: Family Medicine

## 2014-08-31 VITALS — BP 136/78 | HR 64 | Resp 18 | Ht 64.0 in | Wt 165.0 lb

## 2014-08-31 DIAGNOSIS — I77819 Aortic ectasia, unspecified site: Secondary | ICD-10-CM

## 2014-08-31 DIAGNOSIS — F17210 Nicotine dependence, cigarettes, uncomplicated: Secondary | ICD-10-CM | POA: Diagnosis not present

## 2014-08-31 DIAGNOSIS — Z720 Tobacco use: Secondary | ICD-10-CM

## 2014-08-31 DIAGNOSIS — E785 Hyperlipidemia, unspecified: Secondary | ICD-10-CM

## 2014-08-31 DIAGNOSIS — F17200 Nicotine dependence, unspecified, uncomplicated: Secondary | ICD-10-CM

## 2014-08-31 DIAGNOSIS — R918 Other nonspecific abnormal finding of lung field: Secondary | ICD-10-CM

## 2014-08-31 DIAGNOSIS — Z136 Encounter for screening for cardiovascular disorders: Secondary | ICD-10-CM

## 2014-08-31 DIAGNOSIS — E669 Obesity, unspecified: Secondary | ICD-10-CM

## 2014-08-31 DIAGNOSIS — E1169 Type 2 diabetes mellitus with other specified complication: Secondary | ICD-10-CM

## 2014-08-31 DIAGNOSIS — I10 Essential (primary) hypertension: Secondary | ICD-10-CM | POA: Diagnosis not present

## 2014-08-31 DIAGNOSIS — Z87891 Personal history of nicotine dependence: Secondary | ICD-10-CM

## 2014-08-31 DIAGNOSIS — E118 Type 2 diabetes mellitus with unspecified complications: Secondary | ICD-10-CM

## 2014-08-31 DIAGNOSIS — E119 Type 2 diabetes mellitus without complications: Secondary | ICD-10-CM

## 2014-08-31 MED ORDER — BUDESONIDE-FORMOTEROL FUMARATE 160-4.5 MCG/ACT IN AERO: 2.0000 | INHALATION_SPRAY | Freq: Two times a day (BID) | RESPIRATORY_TRACT | Status: DC

## 2014-08-31 NOTE — Patient Instructions (Addendum)
Annual wellnes with foot exam in  4 months, call if you need me before  New additional medication to help breathing,symbicort twice daily You will need to use the nebulizer less often  Start 14 cigarettes daily, and cut down every 2 weeks, QUITTING is the goal    You are referred for Low dose chest scan due to over 30 year pack smoking history, havinstatrted at age 69, alo for an [place] of abdomen to screnn for aneurysm  You are referred for diabetic eye exam to Dr Gershon Crane  Please cut back on cheese, and butter TG are high!

## 2014-09-06 ENCOUNTER — Ambulatory Visit (HOSPITAL_COMMUNITY)
Admission: RE | Admit: 2014-09-06 | Discharge: 2014-09-06 | Disposition: A | Discharge status: Home / Self Care | Payer: Medicare Other | Source: Ambulatory Visit | Attending: Family Medicine | Admitting: Family Medicine

## 2014-09-06 ENCOUNTER — Other Ambulatory Visit: Payer: Self-pay | Admitting: Family Medicine

## 2014-09-06 DIAGNOSIS — F17210 Nicotine dependence, cigarettes, uncomplicated: Secondary | ICD-10-CM | POA: Diagnosis not present

## 2014-09-06 DIAGNOSIS — I77811 Abdominal aortic ectasia: Secondary | ICD-10-CM | POA: Insufficient documentation

## 2014-09-06 DIAGNOSIS — R918 Other nonspecific abnormal finding of lung field: Secondary | ICD-10-CM | POA: Insufficient documentation

## 2014-09-06 DIAGNOSIS — F17200 Nicotine dependence, unspecified, uncomplicated: Secondary | ICD-10-CM

## 2014-09-06 DIAGNOSIS — I10 Essential (primary) hypertension: Secondary | ICD-10-CM

## 2014-09-06 DIAGNOSIS — Z136 Encounter for screening for cardiovascular disorders: Secondary | ICD-10-CM | POA: Diagnosis not present

## 2014-09-06 DIAGNOSIS — Z87891 Personal history of nicotine dependence: Secondary | ICD-10-CM

## 2014-09-06 DIAGNOSIS — Z122 Encounter for screening for malignant neoplasm of respiratory organs: Secondary | ICD-10-CM | POA: Diagnosis not present

## 2014-09-07 DIAGNOSIS — H2513 Age-related nuclear cataract, bilateral: Secondary | ICD-10-CM | POA: Diagnosis not present

## 2014-09-07 DIAGNOSIS — E119 Type 2 diabetes mellitus without complications: Secondary | ICD-10-CM | POA: Diagnosis not present

## 2014-09-07 LAB — HM DIABETES EYE EXAM

## 2014-09-13 ENCOUNTER — Encounter: Payer: Medicare Other | Admitting: Family Medicine

## 2014-09-15 ENCOUNTER — Other Ambulatory Visit: Payer: Self-pay | Admitting: Family Medicine

## 2014-09-26 DIAGNOSIS — I77819 Aortic ectasia, unspecified site: Secondary | ICD-10-CM | POA: Insufficient documentation

## 2014-09-26 NOTE — Assessment & Plan Note (Signed)
Controlled, no change in medication Ms. Nuss is reminded of the importance of commitment to daily physical activity for 30 minutes or more, as able and the need to limit carbohydrate intake to 30 to 60 grams per meal to help with blood sugar control.   The need to take medication as prescribed, test blood sugar as directed, and to call between visits if there is a concern that blood sugar is uncontrolled is also discussed.   Ms. Eskin is reminded of the importance of daily foot exam, annual eye examination, and good blood sugar, blood pressure and cholesterol control.  Diabetic Labs Latest Ref Rng 08/27/2014 05/21/2014 05/05/2014 01/18/2014 09/25/2013  HbA1c <5.7 % 6.0(H) - - 6.0(H) -  Microalbumin <2.0 mg/dL - - 4.2(H) - -  Micro/Creat Ratio 0.0 - 30.0 mg/g - - 84.3(H) - -  Chol 125 - 200 mg/dL 198 - - - 159  HDL >=46 mg/dL 69 - - - 54  Calc LDL <130 mg/dL 90 - - - 66  Triglycerides <150 mg/dL 193(H) - - - 194(H)  Creatinine 0.50 - 0.99 mg/dL 0.73 0.76 - 0.71 0.73   BP/Weight 08/31/2014 06/30/2014 05/26/2014 05/21/2014 05/05/2014 05/02/2014 9/45/0388  Systolic BP 828 003 491 791 505 697 948  Diastolic BP 78 80 48 68 80 66 58  Wt. (Lbs) 165 156 161 161 168 158 160.2  BMI 28.31 26.76 26.79 26.79 27.96 26.29 26.66   Foot/eye exam completion dates Latest Ref Rng 09/07/2014 06/10/2013  Eye Exam No Retinopathy No Retinopathy -  Foot Form Completion - - Done

## 2014-09-26 NOTE — Progress Notes (Signed)
Subjective:    Patient ID: [name], female    DOB: Jul 11, 1945, 69 y.o.   MRN: [id number]  HPI The PT is here for follow up and re-evaluation of chronic medical conditions, medication management and review of any available recent lab and radiology data.  Preventive health is updated, specifically  Cancer screening and Immunization.   Questions or concerns regarding consultations or procedures which the PT has had in the interim are  Addressed.Feels better following cholecystectomy, however , concerned about possible leg swelling and weight gain The PT denies any adverse reactions to current medications since the last visit.  Denies polyuria, polydipsia, blurred vision , or hypoglycemic episodes. Increased use of albuterol with no maintainance inhaler   Review of Systems See HPI Denies recent fever or chills. Denies sinus pressure, nasal congestion, ear pain or sore throat. Denies abdominal pain, nausea, vomiting,diarrhea or constipation.   Denies dysuria, frequency, hesitancy or incontinence. Denies joint pain, swelling and limitation in mobility. Denies headaches, seizures, numbness, or tingling. Denies depression, anxiety or insomnia. Denies skin break down or rash.        Objective:   Physical Exam BP 136/78 mmHg  Pulse 64  Resp 18  Ht 5\' 4"  (1.626 m)  Wt 165 lb (74.844 kg)  BMI 28.31 kg/m2  SpO2 95% Patient alert and oriented and in no cardiopulmonary distress.  HEENT: No facial asymmetry, EOMI,   oropharynx pink and moist.  Neck supple no JVD, no mass.  Chest: Clear to auscultation bilaterally.Decreased air entry throuughout  CVS: S1, S2 no murmurs, no S3.Regular rate.  ABD: Soft non tender.   Ext: No edema  MS: Adequate ROM spine, shoulders, hips and knees.  Skin: Intact, no ulcerations or rash noted.  Psych: Good eye contact, normal affect. Memory intact not anxious or depressed appearing.  CNS: CN 2-12 intact, power,  normal throughout.no focal  deficits noted.        Assessment & Plan:  Hypertension goal BP (blood pressure) < 130/80 Controlled, no change in medication DASH diet and commitment to daily physical activity for a minimum of 30 minutes discussed and encouraged, as a part of hypertension management. The importance of attaining a healthy weight is also discussed.  BP/Weight 08/31/2014 06/30/2014 05/26/2014 05/21/2014 05/05/2014 05/02/2014 9/51/8841  Systolic BP 660 630 160 109 323 557 322  Diastolic BP 78 80 48 68 80 66 58  Wt. (Lbs) 165 156 161 161 168 158 160.2  BMI 28.31 26.76 26.79 26.79 27.96 26.29 26.66        COPD Worsening due to ongoing nicotine use, cessation counseling done. Needs to start daily symbicort  Diabetes mellitus type 2 in obese Controlled, no change in medication [name] is reminded of the importance of commitment to daily physical activity for 30 minutes or more, as able and the need to limit carbohydrate intake to 30 to 60 grams per meal to help with blood sugar control.   The need to take medication as prescribed, test blood sugar as directed, and to call between visits if there is a concern that blood sugar is uncontrolled is also discussed.   [name] is reminded of the importance of daily foot exam, annual eye examination, and good blood sugar, blood pressure and cholesterol control.  Diabetic Labs Latest Ref Rng 08/27/2014 05/21/2014 05/05/2014 01/18/2014 09/25/2013  HbA1c <5.7 % 6.0(H) - - 6.0(H) -  Microalbumin <2.0 mg/dL - - 4.2(H) - -  Micro/Creat Ratio 0.0 - 30.0 mg/g - - 84.3(H) - -  Chol 125 - 200 mg/dL 198 - - - 159  HDL >=46 mg/dL 69 - - - 54  Calc LDL <130 mg/dL 90 - - - 66  Triglycerides <150 mg/dL 193(H) - - - 194(H)  Creatinine 0.50 - 0.99 mg/dL 0.73 0.76 - 0.71 0.73   BP/Weight 08/31/2014 06/30/2014 05/26/2014 05/21/2014 05/05/2014 05/02/2014 0/03/4915  Systolic BP 915 056 979 480 165 537 482  Diastolic BP 78 80 48 68 80 66 58  Wt. (Lbs) 165 156 161 161 168 158 160.2  BMI  28.31 26.76 26.79 26.79 27.96 26.29 26.66   Foot/eye exam completion dates Latest Ref Rng 09/07/2014 06/10/2013  Eye Exam No Retinopathy No Retinopathy -  Foot Form Completion - - Done         Hyperlipidemia LDL goal <100 Uncontrolled Hyperlipidemia:Low fat diet discussed and encouraged.   Lipid Panel  Lab Results  Component Value Date   CHOL 198 08/27/2014   HDL 69 08/27/2014   LDLCALC 90 08/27/2014   TRIG 193* 08/27/2014   CHOLHDL 2.9 08/27/2014        Pulmonary nodules Unchanged in 2016 , but still requires annual screening  NICOTINE ADDICTION Patient counseled for approximately 5 minutes regarding the health risks of ongoing nicotine use, specifically all types of cancer, heart disease, stroke and respiratory failure. The options available for help with cessation ,the behavioral changes to assist the process, and the option to either gradully reduce usage  Or abruptly stop.is also discussed. Pt is also encouraged to set specific goals in number of cigarettes used daily, as well as to set a quit date.  Number of cigarettes/cigars currently smoking daily: 10 to 15

## 2014-09-26 NOTE — Assessment & Plan Note (Signed)
Worsening due to ongoing nicotine use, cessation counseling done. Needs to start daily symbicort

## 2014-09-26 NOTE — Assessment & Plan Note (Signed)
Controlled, no change in medication DASH diet and commitment to daily physical activity for a minimum of 30 minutes discussed and encouraged, as a part of hypertension management. The importance of attaining a healthy weight is also discussed.  BP/Weight 08/31/2014 06/30/2014 05/26/2014 05/21/2014 05/05/2014 05/02/2014 0/92/9574  Systolic BP 734 037 096 438 381 840 375  Diastolic BP 78 80 48 68 80 66 58  Wt. (Lbs) 165 156 161 161 168 158 160.2  BMI 28.31 26.76 26.79 26.79 27.96 26.29 26.66

## 2014-09-26 NOTE — Assessment & Plan Note (Signed)
Uncontrolled Hyperlipidemia:Low fat diet discussed and encouraged.   Lipid Panel  Lab Results  Component Value Date   CHOL 198 08/27/2014   HDL 69 08/27/2014   LDLCALC 90 08/27/2014   TRIG 193* 08/27/2014   CHOLHDL 2.9 08/27/2014

## 2014-09-26 NOTE — Assessment & Plan Note (Signed)
Patient counseled for approximately 5 minutes regarding the health risks of ongoing nicotine use, specifically all types of cancer, heart disease, stroke and respiratory failure. The options available for help with cessation ,the behavioral changes to assist the process, and the option to either gradully reduce usage  Or abruptly stop.is also discussed. Pt is also encouraged to set specific goals in number of cigarettes used daily, as well as to set a quit date.  Number of cigarettes/cigars currently smoking daily: 10 to 15  

## 2014-09-26 NOTE — Assessment & Plan Note (Signed)
Unchanged in 2016 , but still requires annual screening

## 2014-10-05 ENCOUNTER — Other Ambulatory Visit: Payer: Self-pay | Admitting: Family Medicine

## 2014-10-07 ENCOUNTER — Ambulatory Visit: Payer: Medicare Other | Admitting: Obstetrics and Gynecology

## 2014-10-12 ENCOUNTER — Ambulatory Visit: Payer: Medicare Other | Admitting: Obstetrics and Gynecology

## 2014-10-19 ENCOUNTER — Other Ambulatory Visit: Payer: Self-pay

## 2014-10-19 MED ORDER — FLUTICASONE PROPIONATE 50 MCG/ACT NA SUSP: 2.0000 | Freq: Two times a day (BID) | NASAL | Status: DC

## 2014-10-21 ENCOUNTER — Ambulatory Visit: Payer: Medicare Other | Admitting: Obstetrics and Gynecology

## 2014-10-22 ENCOUNTER — Other Ambulatory Visit: Payer: Self-pay | Admitting: Family Medicine

## 2014-10-25 ENCOUNTER — Encounter: Payer: Self-pay | Admitting: Obstetrics and Gynecology

## 2014-10-25 ENCOUNTER — Ambulatory Visit (INDEPENDENT_AMBULATORY_CARE_PROVIDER_SITE_OTHER): Payer: Medicare Other | Admitting: Obstetrics and Gynecology

## 2014-10-25 VITALS — BP 150/62 | HR 72 | Wt 164.2 lb

## 2014-10-25 DIAGNOSIS — IMO0002 Reserved for concepts with insufficient information to code with codable children: Secondary | ICD-10-CM

## 2014-10-25 DIAGNOSIS — D3911 Neoplasm of uncertain behavior of right ovary: Secondary | ICD-10-CM

## 2014-10-25 DIAGNOSIS — N949 Unspecified condition associated with female genital organs and menstrual cycle: Secondary | ICD-10-CM

## 2014-10-25 DIAGNOSIS — D4959 Neoplasm of unspecified behavior of other genitourinary organ: Secondary | ICD-10-CM

## 2014-10-25 NOTE — Progress Notes (Signed)
[name]  Signed Gynecology Progress Notes 10/25/2014 3:44 PM    Expand All Collapse All   Patient ID: [name], female DOB: 1945/12/25, 69 y.o. MRN: [id number]   [facility] Visit  Patient name: [name] [id number] Date of birth: 1945-11-25  CC & HPI:  [name] is a 69 y.o. female presenting today for follow-up on a cyst on her right ovary that was seen on [place] 1 year ago. Pt reports improved pain within the last year. She reports some abdominal bloating and soreness under her rib cage. Pt is physically active. She had cholecystectomy on 05/26/2014.  ROS:  A complete 10 system review of systems was obtained and all systems are negative except as noted in the HPI and PMH.   Pertinent History Reviewed:  Reviewed: Significant for cholecystectomy, abdominal hysterectomy, right ovarian cyst Medical  Past Medical History  Diagnosis Date  . Hypertension   . Nicotine dependence   . COPD (chronic obstructive pulmonary disease) ([place])   . Osteoarthritis   . Diverticula of colon   . Hyperlipidemia   . Diabetes mellitus without complication ([place])   . Kidney stones   . Breast cancer, left breast ([place])   . Breast cancer ([place]) 03/11/2012    Stage I (T1b N0 M0), grade 1 well-differentiated carcinoma of the left breast status, post lumpectomy followed by radiation therapy. Her estrogen receptor receptors were 93%, progesterone receptors 67%. HER-2/neu was negative. No lymphovascular space invasion was seen. All margins were clear. Ki-67 marker was low at 1% with surgery on 11/15/2004. Treated then with post-lumpectomy radiation, finish    Surgical Hx:  Past Surgical History  Procedure Laterality Date  . Abdominal hysterectomy    . Tubal ligation    . Left breast       cancer, in 2006  . Colectomy      2005, diverticulitis  .  Breast surgery  2005 approx    left lumpectomy   . Dilation and curettage of uterus    . Cholecystectomy N/A 05/26/2014    Procedure: LAPAROSCOPIC CHOLECYSTECTOMY; Surgeon: Aviva Signs Md, MD; Location: [facility]; Service: General; Laterality: N/A;   Medications: Reviewed & Updated - see associated section   Current outpatient prescriptions:  . albuterol (PROVENTIL HFA;VENTOLIN HFA) 108 (90 BASE) MCG/ACT inhaler, Inhale 2 puffs into the lungs 4 (four) times daily as needed for wheezing or shortness of breath., Disp: , Rfl:  . albuterol (PROVENTIL) (2.5 MG/3ML) 0.083% nebulizer solution, USE 1 VIAL IN NEBULIZER 3 TIMES DAILY., Disp: 300 mL, Rfl: 0 . amLODipine (NORVASC) 5 MG tablet, TAKE 1 TABLET BY MOUTH ONCE A DAY., Disp: 30 tablet, Rfl: 4 . aspirin EC 81 MG tablet, Take 81 mg by mouth daily., Disp: , Rfl:  . benazepril (LOTENSIN) 40 MG tablet, TAKE ONE TABLET BY MOUTH DAILY., Disp: 90 tablet, Rfl: 1 . budesonide-formoterol (SYMBICORT) 160-4.5 MCG/ACT inhaler, Inhale 2 puffs into the lungs 2 (two) times daily., Disp: 1 Inhaler, Rfl: 4 . Calcium Carbonate-Vitamin D (CALCIUM 600 + D PO), Take 1 tablet by mouth 2 (two) times daily. , Disp: , Rfl:  . fluticasone (FLONASE) 50 MCG/ACT nasal spray, Place 2 sprays into both nostrils 2 (two) times daily., Disp: 16 g, Rfl: 5 . HYDROcodone-acetaminophen (NORCO) 7.5-325 MG per tablet, Take 1 tablet by mouth every 4 (four) hours as needed for moderate pain or severe pain., Disp: 50 tablet, Rfl: 0 . loratadine (CLARITIN) 10 MG tablet, TAKE 1 TABLET BY MOUTH ONCE  DAILY FOR ALLERGIES., Disp: 30 tablet, Rfl: 4 . metFORMIN (GLUCOPHAGE) 500 MG tablet, TAKE 1 TABLET BY MOUTH DAILY WITH BREAKFAST., Disp: 30 tablet, Rfl: 3 . Omega-3 Fatty Acids (FISH OIL) 1000 MG CAPS, Take 2 capsules by mouth daily., Disp: , Rfl:  . polyethylene glycol powder (GLYCOLAX/MIRALAX) powder, MIX 1 CAPFUL IN 8 OUNCES OF JUICE OR  WATER AND DRINK ONCE DAILY., Disp: 527 g, Rfl: 5 . potassium chloride SA (K-DUR,KLOR-CON) 20 MEQ tablet, TAKE 1 TABLET BY MOUTH DAILY., Disp: 30 tablet, Rfl: 3 . simvastatin (ZOCOR) 20 MG tablet, TAKE (1) TABLET BY MOUTH DAILY AT 6 PM FOR CHOLESTEROL., Disp: 30 tablet, Rfl: 5 . tiZANidine (ZANAFLEX) 4 MG capsule, Take 4 mg by mouth every 8 (eight) hours as needed for muscle spasms., Disp: , Rfl:  . acetaminophen (TYLENOL) 500 MG tablet, Take 500-1,000 mg by mouth every 6 (six) hours as needed for moderate pain. , Disp: , Rfl:   Current facility-administered medications:  . Influenza (>/= 3 years) inactive virus vaccine (FLVIRIN/FLUZONE) injection SUSP 0.5 mL, 0.5 mL, Intramuscular, Once, [name], MD   Social History: Reviewed -  reports that she has been smoking Cigarettes. She has a 25 pack-year smoking history. She has never used smokeless tobacco.  Objective Findings:  Vitals: Blood pressure 150/62, pulse 72, weight 164 lb 3.2 oz (74.481 kg).  Physical Examination: General appearance - alert, well appearing, and in no distress, oriented to person, place, and time and normal appearing weight Mental status - alert, oriented to person, place, and time, normal mood, behavior, speech, dress, motor activity, and thought processes Pelvic - normal external genitalia, vulva, vagina VULVA: normal appearing vulva with no masses, tenderness or lesions VAGINA: normal appearing vagina with normal color and discharge, no lesions, nonspecific tenderness on the right adnexal area CERVIX: surgically absent ADNEXA: surgically absent bilateral  Assessment & Plan:   A:  1. Non-specific tenderness of right adnexa 2. Hx simple right postmenopausal ov cyst, probable hydrosalpinx  P:  1. US Transvaginal non-OB within the next few days 2. Lab work Ca 125.   By signing my name below, I, [name], attest that this documentation has been prepared under the direction and in  the presence of Mallory Shirk, MD. Electronically Signed: Tula [name], ED Scribe. 10/25/2014. 3:45 PM.  I personally performed the services described in this documentation, which was SCRIBED in my presence. The recorded information has been reviewed and considered accurate. It has been edited as necessary during review. Jonnie Kind, MD

## 2014-10-25 NOTE — Progress Notes (Deleted)
Patient ID: [name], female   DOB: 12/16/1945, 69 y.o.   MRN: [id number]    [facility] Visit  Patient name: [name] MRN [id number]  Date of birth: 08/19/1945  CC & HPI:  [name] is a 69 y.o. female presenting today for follow-up on a cyst on her right ovary that was seen on [place] 1 year ago. Pt reports improved pain within the last year. She reports some abdominal bloating and soreness under her rib cage. Pt is physically active. She had cholecystectomy on 05/26/2014.  ROS:  A complete 10 system review of systems was obtained and all systems are negative except as noted in the HPI and PMH.   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 Her estrogen receptor receptors were 93%, progesterone receptors 67%. HER-2/neu was negative. No lymphovascular space invasion was seen. All margins were clear. Ki-67 marker was low at 1% with surgery on 11/15/2004. Treated then with post-lumpectomy radiation, finish                              Surgical Hx:    Past Surgical History  Procedure Laterality Date   Abdominal hysterectomy     Tubal ligation     Left breast       cancer, in 2006   Colectomy      2005, diverticulitis   Breast surgery  2005 approx    left lumpectomy    Dilation and curettage of uterus     Cholecystectomy N/A 05/26/2014    Procedure: LAPAROSCOPIC CHOLECYSTECTOMY;  Surgeon: Aviva Signs Md,  MD;  Location: [facility];  Service: General;  Laterality: N/A;   Medications: Reviewed & Updated - see associated section                       Current outpatient prescriptions:    albuterol (PROVENTIL HFA;VENTOLIN HFA) 108 (90 BASE) MCG/ACT inhaler, Inhale 2 puffs into the lungs 4 (four) times daily as needed for wheezing or shortness of breath., Disp: , Rfl:    albuterol (PROVENTIL) (2.5 MG/3ML) 0.083% nebulizer solution, USE 1 VIAL IN NEBULIZER 3 TIMES DAILY., Disp: 300 mL, Rfl: 0   amLODipine (NORVASC) 5 MG tablet, TAKE 1 TABLET BY MOUTH ONCE A DAY., Disp: 30 tablet, Rfl: 4   aspirin EC 81 MG tablet, Take 81 mg by mouth daily., Disp: , Rfl:    benazepril (LOTENSIN) 40 MG tablet, TAKE ONE TABLET BY MOUTH DAILY., Disp: 90 tablet, Rfl: 1   budesonide-formoterol (SYMBICORT) 160-4.5 MCG/ACT inhaler, Inhale 2 puffs into the lungs 2 (two) times daily., Disp: 1 Inhaler, Rfl: 4   Calcium Carbonate-Vitamin D (CALCIUM 600 + D PO), Take 1 tablet by mouth 2 (two) times daily. , Disp: , Rfl:    fluticasone (FLONASE) 50 MCG/ACT nasal spray, Place  2 sprays into both nostrils 2 (two) times daily., Disp: 16 g, Rfl: 5   HYDROcodone-acetaminophen (NORCO) 7.5-325 MG per tablet, Take 1 tablet by mouth every 4 (four) hours as needed for moderate pain or severe pain., Disp: 50 tablet, Rfl: 0   loratadine (CLARITIN) 10 MG tablet, TAKE 1 TABLET BY MOUTH ONCE DAILY FOR ALLERGIES., Disp: 30 tablet, Rfl: 4   metFORMIN (GLUCOPHAGE) 500 MG tablet, TAKE 1 TABLET BY MOUTH DAILY WITH BREAKFAST., Disp: 30 tablet, Rfl: 3   Omega-3 Fatty Acids (FISH OIL) 1000 MG CAPS, Take 2 capsules by mouth daily., Disp: , Rfl:    polyethylene glycol powder (GLYCOLAX/MIRALAX) powder, MIX 1 CAPFUL IN 8 OUNCES OF JUICE OR WATER AND DRINK ONCE DAILY., Disp: 527 g, Rfl: 5   potassium chloride SA (K-DUR,KLOR-CON) 20 MEQ tablet, TAKE 1 TABLET BY MOUTH DAILY., Disp: 30 tablet, Rfl: 3   simvastatin (ZOCOR) 20 MG tablet, TAKE (1) TABLET BY  MOUTH DAILY AT 6 PM FOR CHOLESTEROL., Disp: 30 tablet, Rfl: 5   tiZANidine (ZANAFLEX) 4 MG capsule, Take 4 mg by mouth every 8 (eight) hours as needed for muscle spasms., Disp: , Rfl:    acetaminophen (TYLENOL) 500 MG tablet, Take 500-1,000 mg by mouth every 6 (six) hours as needed for moderate pain. , Disp: , Rfl:   Current facility-administered medications:    Influenza (>/= 3 years) inactive virus vaccine (FLVIRIN/FLUZONE) injection SUSP 0.5 mL, 0.5 mL, Intramuscular, Once, [name], MD   Social History: Reviewed -  reports that she has been smoking Cigarettes.  She has a 25 pack-year smoking history. She has never used smokeless tobacco.  Objective Findings:  Vitals: Blood pressure 150/62, pulse 72, weight 164 lb 3.2 oz (74.481 kg).  Physical Examination: General appearance - alert, well appearing, and in no distress, oriented to person, place, and time and normal appearing weight Mental status - alert, oriented to person, place, and time, normal mood, behavior, speech, dress, motor activity, and thought processes Pelvic - normal external genitalia, vulva, vagina VULVA: normal appearing vulva with no masses, tenderness or lesions VAGINA: normal appearing vagina with normal color and discharge, no lesions, nonspecific tenderness on the right adnexal area CERVIX: surgically absent ADNEXA: surgically absent bilateral  Assessment & Plan:   A:  1. Non-specific tenderness of right adnexa 2. Hx simple right postmenopausal ov cyst, probable hydrosalpinx  P:  1. US Transvaginal non-OB within the next few days 2. Lab work Ca 125.   By signing my name below, I, [name], attest that this documentation has been prepared under the direction and in the presence of Mallory Shirk, MD. Electronically Signed: [name], ED Scribe. 10/25/2014. 3:45 PM.  I personally performed the services described in this documentation, which was SCRIBED in my presence. The recorded  information has been reviewed and considered accurate. It has been edited as necessary during review. Jonnie Kind, MD

## 2014-10-26 ENCOUNTER — Telehealth: Payer: Self-pay | Admitting: Obstetrics and Gynecology

## 2014-10-26 DIAGNOSIS — I10 Essential (primary) hypertension: Secondary | ICD-10-CM | POA: Diagnosis not present

## 2014-10-26 DIAGNOSIS — Z6829 Body mass index (BMI) 29.0-29.9, adult: Secondary | ICD-10-CM | POA: Diagnosis not present

## 2014-10-26 DIAGNOSIS — Z8719 Personal history of other diseases of the digestive system: Secondary | ICD-10-CM | POA: Diagnosis not present

## 2014-10-26 DIAGNOSIS — M25561 Pain in right knee: Secondary | ICD-10-CM | POA: Diagnosis not present

## 2014-10-26 DIAGNOSIS — M25511 Pain in right shoulder: Secondary | ICD-10-CM | POA: Diagnosis not present

## 2014-10-27 NOTE — Telephone Encounter (Signed)
Pt states taht she just wanted to make sure that we had all the right medication in her chart. Pt gave a list of all the inhailers that she uses.

## 2014-10-28 ENCOUNTER — Other Ambulatory Visit: Payer: Self-pay | Admitting: Family Medicine

## 2014-10-28 ENCOUNTER — Other Ambulatory Visit: Payer: Self-pay | Admitting: Obstetrics and Gynecology

## 2014-10-28 DIAGNOSIS — D4959 Neoplasm of unspecified behavior of other genitourinary organ: Secondary | ICD-10-CM

## 2014-10-28 DIAGNOSIS — D3911 Neoplasm of uncertain behavior of right ovary: Secondary | ICD-10-CM

## 2014-10-28 DIAGNOSIS — IMO0002 Reserved for concepts with insufficient information to code with codable children: Secondary | ICD-10-CM

## 2014-10-29 ENCOUNTER — Telehealth: Payer: Self-pay | Admitting: Obstetrics and Gynecology

## 2014-10-29 MED ORDER — AZITHROMYCIN 250 MG PO TABS: 250.0000 mg | ORAL_TABLET | Freq: Every day | ORAL | Status: DC

## 2014-10-29 NOTE — Telephone Encounter (Addendum)
Dr. Glo Herring gave a verbal order for Azithromycin. LMOM for pt to check with her pharmacy.

## 2014-11-01 ENCOUNTER — Ambulatory Visit (INDEPENDENT_AMBULATORY_CARE_PROVIDER_SITE_OTHER): Payer: Medicare Other

## 2014-11-01 DIAGNOSIS — N949 Unspecified condition associated with female genital organs and menstrual cycle: Secondary | ICD-10-CM | POA: Diagnosis not present

## 2014-11-01 DIAGNOSIS — D4959 Neoplasm of unspecified behavior of other genitourinary organ: Secondary | ICD-10-CM

## 2014-11-01 DIAGNOSIS — D3911 Neoplasm of uncertain behavior of right ovary: Secondary | ICD-10-CM

## 2014-11-01 DIAGNOSIS — IMO0002 Reserved for concepts with insufficient information to code with codable children: Secondary | ICD-10-CM

## 2014-11-01 NOTE — Progress Notes (Signed)
PELVIC US TA: normal vag cuff, small simple cyst rt ov .8 x .7 x .7cm,simple cystic area adjacent to rt ov 2.8 x 1.1 x 2.2cm (? Hydrosalpinx),rt ov was NOT mobile w/probe pressure,limited view of lt ov,no free fluid,pain lt adnexa during ultrasound . Pt has a f/u appt w/Dr Glo Herring.

## 2014-11-02 LAB — CA 125: CA 125: 10 U/mL (ref 0.0–38.1)

## 2014-11-04 ENCOUNTER — Telehealth: Payer: Self-pay | Admitting: Obstetrics and Gynecology

## 2014-11-04 ENCOUNTER — Other Ambulatory Visit: Payer: Self-pay | Admitting: Family Medicine

## 2014-11-04 NOTE — Telephone Encounter (Signed)
Pt aware that he had called in a z-pak. Pt has already taken that, pt aware no other medication was sent in for her.

## 2014-11-09 ENCOUNTER — Other Ambulatory Visit: Payer: Self-pay | Admitting: Obstetrics and Gynecology

## 2014-11-18 ENCOUNTER — Telehealth: Payer: Self-pay | Admitting: Obstetrics and Gynecology

## 2014-11-23 NOTE — Telephone Encounter (Signed)
U/s reviewed : benign cyst <1 cm Pt to fu prn

## 2014-12-01 ENCOUNTER — Other Ambulatory Visit: Payer: Self-pay | Admitting: Family Medicine

## 2014-12-22 ENCOUNTER — Ambulatory Visit (INDEPENDENT_AMBULATORY_CARE_PROVIDER_SITE_OTHER): Payer: Medicare Other

## 2014-12-22 DIAGNOSIS — Z23 Encounter for immunization: Secondary | ICD-10-CM

## 2015-01-04 ENCOUNTER — Ambulatory Visit (INDEPENDENT_AMBULATORY_CARE_PROVIDER_SITE_OTHER): Payer: Medicare Other | Admitting: Family Medicine

## 2015-01-04 ENCOUNTER — Encounter: Payer: Self-pay | Admitting: Family Medicine

## 2015-01-04 VITALS — BP 136/74 | HR 94 | Resp 18 | Ht 64.0 in | Wt 160.0 lb

## 2015-01-04 DIAGNOSIS — J309 Allergic rhinitis, unspecified: Secondary | ICD-10-CM | POA: Insufficient documentation

## 2015-01-04 DIAGNOSIS — J3089 Other allergic rhinitis: Secondary | ICD-10-CM

## 2015-01-04 DIAGNOSIS — Z0000 Encounter for general adult medical examination without abnormal findings: Secondary | ICD-10-CM

## 2015-01-04 DIAGNOSIS — E669 Obesity, unspecified: Secondary | ICD-10-CM

## 2015-01-04 DIAGNOSIS — Z23 Encounter for immunization: Secondary | ICD-10-CM | POA: Diagnosis not present

## 2015-01-04 DIAGNOSIS — E119 Type 2 diabetes mellitus without complications: Secondary | ICD-10-CM | POA: Diagnosis not present

## 2015-01-04 DIAGNOSIS — E559 Vitamin D deficiency, unspecified: Secondary | ICD-10-CM | POA: Diagnosis not present

## 2015-01-04 DIAGNOSIS — F17200 Nicotine dependence, unspecified, uncomplicated: Secondary | ICD-10-CM

## 2015-01-04 DIAGNOSIS — E1169 Type 2 diabetes mellitus with other specified complication: Secondary | ICD-10-CM

## 2015-01-04 DIAGNOSIS — Z1231 Encounter for screening mammogram for malignant neoplasm of breast: Secondary | ICD-10-CM

## 2015-01-04 DIAGNOSIS — M8580 Other specified disorders of bone density and structure, unspecified site: Secondary | ICD-10-CM

## 2015-01-04 DIAGNOSIS — I10 Essential (primary) hypertension: Secondary | ICD-10-CM | POA: Diagnosis not present

## 2015-01-04 MED ORDER — AZELASTINE HCL 0.1 % NA SOLN: 2.0000 | Freq: Two times a day (BID) | NASAL | Status: DC

## 2015-01-04 NOTE — Patient Instructions (Addendum)
F/u in 4 month, call if you need me before  Prevnar today  Please work on smoking cessation  Labs today  New allergy nose spray starting in January, astellin  Xopenex is for people with rapid heart rate problems so unlikely to be covered  Thanks for choosing [facility], we consider it a privelige to serve you.  All the best for 2017!

## 2015-01-04 NOTE — Progress Notes (Signed)
Subjective:    Patient ID: SIOBHAIN ISLAND, female    DOB: 05/27/1945, 69 y.o.   MRN: [id number]  HPI  Preventive Screening-Counseling & Management   Patient present here today for a Medicare annual wellness visit.   Current Problems (verified)   Medications Prior to Visit Allergies (verified)   PAST HISTORY  Family History updated   Social History Seperated mother of 5 (3 of whom are deceased); retired Oncologist    Risk Factors  Current exercise habits:  Sedentary; will increase activity   Dietary issues discussed:  Heart healthy low fat diet, lower fat intake   Cardiac risk factors: nicotine, diabetes  Depression Screen  (Note: if answer to either of the following is "Yes", a more complete depression screening is indicated)   Over the past two weeks, have you felt down, depressed or hopeless? No  Over the past two weeks, have you felt little interest or pleasure in doing things? No  Have you lost interest or pleasure in daily life? No  Do you often feel hopeless? No  Do you cry easily over simple problems? No   Activities of Daily Living  In your present state of health, do you have any difficulty performing the following activities?  Driving?: No but does not actively drive Managing money?: No Feeding yourself?:No Getting from bed to chair?:No Climbing a flight of stairs?:No Preparing food and eating?:No Bathing or showering?:No Getting dressed?:No Getting to the toilet?:No Using the toilet?:No Moving around from place to place?: No  Fall Risk Assessment In the past year have you fallen or had a near fall?:No Are you currently taking any medications that make you dizzy?:No   Hearing Difficulties: No Do you often ask people to speak up or repeat themselves?:No Do you experience ringing or noises in your ears?:No Do you have difficulty understanding soft or whispered voices?:No  Cognitive Testing  Alert? Yes Normal Appearance?Yes  Oriented to  person? Yes Place? Yes  Time? Yes  Displays appropriate judgment?Yes  Can read the correct time from a watch face? yes Are you having problems remembering things?No age appropriate   Advanced Directives have been discussed with the patient?Yes, full code and brochure given    List the Names of Other Physician/Practitioners you currently use: careteams updated    Indicate any recent Medical Services you may have received from other than [facility] providers in the past year (date may be approximate).   Assessment:    Annual Wellness Exam   Plan:      Medicare Attestation  I have personally reviewed:  The patient's medical and social history  Their use of alcohol, tobacco or illicit drugs  Their current medications and supplements  The patient's functional ability including ADLs,fall risks, home safety risks, cognitive, and hearing and visual impairment  Diet and physical activities  Evidence for depression or mood disorders  The patient's weight, height, BMI, and visual acuity have been recorded in the chart. I have made referrals, counseling, and provided education to the patient based on review of the above and I have provided the patient with a written personalized care plan for preventive services.     Review of Systems     Objective:   Physical Exam  BP 136/74 mmHg  Pulse 94  Resp 18  Ht 5\' 4"  (1.626 m)  Wt 160 lb (72.576 kg)  BMI 27.45 kg/m2  SpO2 96%       Assessment & Plan:  Medicare annual wellness visit, subsequent Annual  exam as documented. Counseling done  re healthy lifestyle involving commitment to 150 minutes exercise per week, heart healthy diet, and attaining healthy weight.The importance of adequate sleep also discussed. Regular seat belt use and home safety, is also discussed. Changes in health habits are decided on by the patient with goals and time frames  set for achieving them. Immunization and cancer screening needs are specifically addressed  at this visit.   Need for vaccination with 13-polyvalent pneumococcal conjugate vaccine After obtaining informed consent, the vaccine is  administered by LPN.   Allergic rhinitis Upcoming change in formulary, change made in medication for maintainince  NICOTINE ADDICTION Patient counseled for approximately 5 minutes regarding the health risks of ongoing nicotine use, specifically all types of cancer, heart disease, stroke and respiratory failure. The options available for help with cessation ,the behavioral changes to assist the process, and the option to either gradully reduce usage  Or abruptly stop.is also discussed. Pt is also encouraged to set specific goals in number of cigarettes used daily, as well as to set a quit date.  Number of cigarettes/cigars currently smoking daily:10

## 2015-01-05 ENCOUNTER — Telehealth: Payer: Self-pay | Admitting: Family Medicine

## 2015-01-05 LAB — VITAMIN D 25 HYDROXY (VIT D DEFICIENCY, FRACTURES): VIT D 25 HYDROXY: 32 ng/mL (ref 30–100)

## 2015-01-05 LAB — HEMOGLOBIN A1C
HEMOGLOBIN A1C: 5.9 % — AB (ref <5.7)
MEAN PLASMA GLUCOSE: 123 mg/dL — AB (ref <117)

## 2015-01-05 LAB — COMPLETE METABOLIC PANEL WITH GFR
ALT: 11 U/L (ref 6–29)
AST: 12 U/L (ref 10–35)
Albumin: 4.4 g/dL (ref 3.6–5.1)
Alkaline Phosphatase: 61 U/L (ref 33–130)
BILIRUBIN TOTAL: 0.3 mg/dL (ref 0.2–1.2)
BUN: 22 mg/dL (ref 7–25)
CALCIUM: 10.8 mg/dL — AB (ref 8.6–10.4)
CHLORIDE: 102 mmol/L (ref 98–110)
CO2: 27 mmol/L (ref 20–31)
CREATININE: 0.87 mg/dL (ref 0.60–0.93)
GFR, EST AFRICAN AMERICAN: 78 mL/min (ref >=60)
GFR, EST NON AFRICAN AMERICAN: 68 mL/min (ref >=60)
Glucose, Bld: 73 mg/dL (ref 65–99)
Potassium: 4.6 mmol/L (ref 3.5–5.3)
Sodium: 139 mmol/L (ref 135–146)
Total Protein: 7.6 g/dL (ref 6.1–8.1)

## 2015-01-05 LAB — TSH: TSH: 1.085 u[IU]/mL (ref 0.350–4.500)

## 2015-01-05 MED ORDER — MONTELUKAST SODIUM 10 MG PO TABS: 10.0000 mg | ORAL_TABLET | Freq: Every day | ORAL | Status: DC

## 2015-01-05 NOTE — Telephone Encounter (Signed)
Patient aware and medication sent.

## 2015-01-05 NOTE — Addendum Note (Signed)
Addended by: Denman George B on: 01/05/2015 12:07 PM   Modules accepted: Orders

## 2015-01-05 NOTE — Telephone Encounter (Signed)
Advised patient that antibiotics are not prescribed usually for her symptoms.   She states that Sudafed makes her break out in rash.  She is asking if anything other than the nasal spray can be prescribed.  Please advise.

## 2015-01-05 NOTE — Telephone Encounter (Signed)
Patient is calling requesting an antibiotic for ear ache, headache, cough, sorethroat, sinus infection, she stated symptoms started yesterday but she forgot to tell Dr. Moshe Cipro when she was here, please advise?

## 2015-01-05 NOTE — Telephone Encounter (Signed)
Yes singulair 10 mg daily , pls pA if needed, she has COPD, with chronic cough and uncontrolled allergies

## 2015-01-10 ENCOUNTER — Encounter: Payer: Self-pay | Admitting: Family Medicine

## 2015-01-10 NOTE — Assessment & Plan Note (Signed)

## 2015-01-10 NOTE — Assessment & Plan Note (Signed)

## 2015-01-10 NOTE — Assessment & Plan Note (Signed)
After obtaining informed consent, the vaccine is  administered by LPN.  

## 2015-01-10 NOTE — Assessment & Plan Note (Signed)
Upcoming change in formulary, change made in medication for maintainince

## 2015-01-12 ENCOUNTER — Other Ambulatory Visit (HOSPITAL_COMMUNITY): Payer: Self-pay | Admitting: Oncology

## 2015-01-12 DIAGNOSIS — Z1231 Encounter for screening mammogram for malignant neoplasm of breast: Secondary | ICD-10-CM

## 2015-01-19 ENCOUNTER — Other Ambulatory Visit: Payer: Self-pay | Admitting: Obstetrics and Gynecology

## 2015-01-28 ENCOUNTER — Telehealth: Payer: Self-pay | Admitting: Family Medicine

## 2015-01-28 NOTE — Telephone Encounter (Signed)
Called and spoke with patient.  She is complaining of sore throat, left ear pain, and possible fever with noted chills.  Advised that she seek care at the urgent care.

## 2015-01-28 NOTE — Telephone Encounter (Signed)
[name] is asking if Dr. Moshe Cipro would call her in an antibiotic, she is not feeling any better and the allergy medication hasnt helped , please advise?

## 2015-02-03 ENCOUNTER — Other Ambulatory Visit: Payer: Self-pay | Admitting: Family Medicine

## 2015-02-07 ENCOUNTER — Ambulatory Visit (HOSPITAL_COMMUNITY)
Admission: RE | Admit: 2015-02-07 | Discharge: 2015-02-07 | Disposition: A | Discharge status: Home / Self Care | Payer: Medicare Other | Source: Ambulatory Visit | Attending: Oncology | Admitting: Oncology

## 2015-02-07 DIAGNOSIS — Z1231 Encounter for screening mammogram for malignant neoplasm of breast: Secondary | ICD-10-CM | POA: Diagnosis not present

## 2015-02-10 DIAGNOSIS — M25511 Pain in right shoulder: Secondary | ICD-10-CM | POA: Diagnosis not present

## 2015-02-10 DIAGNOSIS — M25551 Pain in right hip: Secondary | ICD-10-CM | POA: Diagnosis not present

## 2015-02-10 DIAGNOSIS — I10 Essential (primary) hypertension: Secondary | ICD-10-CM | POA: Diagnosis not present

## 2015-02-10 DIAGNOSIS — Z8719 Personal history of other diseases of the digestive system: Secondary | ICD-10-CM | POA: Diagnosis not present

## 2015-02-10 DIAGNOSIS — M25561 Pain in right knee: Secondary | ICD-10-CM | POA: Diagnosis not present

## 2015-02-10 DIAGNOSIS — Z6829 Body mass index (BMI) 29.0-29.9, adult: Secondary | ICD-10-CM | POA: Diagnosis not present

## 2015-02-14 ENCOUNTER — Encounter (HOSPITAL_COMMUNITY): Payer: Self-pay | Admitting: Emergency Medicine

## 2015-02-14 ENCOUNTER — Other Ambulatory Visit: Payer: Self-pay | Admitting: Family Medicine

## 2015-02-15 ENCOUNTER — Ambulatory Visit (INDEPENDENT_AMBULATORY_CARE_PROVIDER_SITE_OTHER): Payer: Medicare Other | Admitting: Family Medicine

## 2015-02-15 ENCOUNTER — Ambulatory Visit: Payer: Medicare Other | Admitting: Family Medicine

## 2015-02-15 ENCOUNTER — Encounter: Payer: Self-pay | Admitting: Family Medicine

## 2015-02-15 VITALS — BP 120/78 | HR 92 | Temp 98.8°F | Resp 16 | Ht 64.0 in | Wt 163.1 lb

## 2015-02-15 DIAGNOSIS — J020 Streptococcal pharyngitis: Secondary | ICD-10-CM

## 2015-02-15 LAB — POCT RAPID STREP A (OFFICE): RAPID STREP A SCREEN: POSITIVE — AB

## 2015-02-15 MED ORDER — ERYTHROMYCIN BASE 500 MG PO TABS: 500.0000 mg | ORAL_TABLET | Freq: Three times a day (TID) | ORAL | Status: DC

## 2015-02-15 NOTE — Patient Instructions (Addendum)
F/u as before, call if you need me sooner  You have strep throat  Take entire course of medication   Gargle with salt water  Strep Throat Strep throat is a bacterial infection of the throat. Your health care provider may call the infection tonsillitis or pharyngitis, depending on whether there is swelling in the tonsils or at the back of the throat. Strep throat is most common during the cold months of the year in children who are 75-71 years of age, but it can happen during any season in people of any age. This infection is spread from person to person (contagious) through coughing, sneezing, or close contact. CAUSES Strep throat is caused by the bacteria called Streptococcus pyogenes. RISK FACTORS This condition is more likely to develop in:  People who spend time in crowded places where the infection can spread easily.  People who have close contact with someone who has strep throat. SYMPTOMS Symptoms of this condition include:  Fever or chills.   Redness, swelling, or pain in the tonsils or throat.  Pain or difficulty when swallowing.  White or yellow spots on the tonsils or throat.  Swollen, tender glands in the neck or under the jaw.  Red rash all over the body (rare). DIAGNOSIS This condition is diagnosed by performing a rapid strep test or by taking a swab of your throat (throat culture test). Results from a rapid strep test are usually ready in a few minutes, but throat culture test results are available after one or two days. TREATMENT This condition is treated with antibiotic medicine. HOME CARE INSTRUCTIONS Medicines  Take over-the-counter and prescription medicines only as told by your health care provider.  Take your antibiotic as told by your health care provider. Do not stop taking the antibiotic even if you start to feel better.  Have family members who also have a sore throat or fever tested for strep throat. They may need antibiotics if they have the  strep infection. Eating and Drinking  Do not share food, drinking cups, or personal items that could cause the infection to spread to other people.  If swallowing is difficult, try eating soft foods until your sore throat feels better.  Drink enough fluid to keep your urine clear or pale yellow. General Instructions  Gargle with a salt-water mixture 3-4 times per day or as needed. To make a salt-water mixture, completely dissolve -1 tsp of salt in 1 cup of warm water.  Make sure that all household members wash their hands well.  Get plenty of rest.  Stay home from school or work until you have been taking antibiotics for 24 hours.  Keep all follow-up visits as told by your health care provider. This is important. SEEK MEDICAL CARE IF:  The glands in your neck continue to get bigger.  You develop a rash, cough, or earache.  You cough up a thick liquid that is green, yellow-brown, or bloody.  You have pain or discomfort that does not get better with medicine.  Your problems seem to be getting worse rather than better.  You have a fever. SEEK IMMEDIATE MEDICAL CARE IF:  You have new symptoms, such as vomiting, severe headache, stiff or painful neck, chest pain, or shortness of breath.  You have severe throat pain, drooling, or changes in your voice.  You have swelling of the neck, or the skin on the neck becomes red and tender.  You have signs of dehydration, such as fatigue, dry mouth, and decreased urination.  You become increasingly sleepy, or you cannot wake up completely.  Your joints become red or painful.   This information is not intended to replace advice given to you by your health care provider. Make sure you discuss any questions you have with your health care provider.   Document Released: 12/30/1999 Document Revised: 09/22/2014 Document Reviewed: 04/26/2014 Elsevier Interactive Patient Education [employer].

## 2015-02-16 ENCOUNTER — Ambulatory Visit: Payer: Medicare Other | Admitting: Family Medicine

## 2015-02-25 NOTE — Progress Notes (Signed)
   Subjective:    Patient ID: [name], female    DOB: 09/24/1945, 70 y.o.   MRN: [id number]  HPI 1 week h/o sore throa t and left ear pain, fever x 2 days No productive cough or sinus drainage. No wheezing Does have positive sick contact in the home   Review of Systems See HPI . Denies chest pains, palpitations and leg swelling Denies abdominal pain, nausea, vomiting,diarrhea or constipation.   Denies dysuria, frequency, hesitancy or incontinence. Denies joint pain, swelling and limitation in mobility. Denies headaches, seizures, numbness, or tingling. Denies depression, anxiety or insomnia. Denies skin break down or rash.        Objective:   Physical Exam BP 120/78 mmHg  Pulse 92  Temp(Src) 98.8 F (37.1 C) (Oral)  Resp 16  Ht 5\' 4"  (1.626 m)  Wt 163 lb 1.9 oz (73.991 kg)  BMI 27.99 kg/m2  SpO2 95%  Patient alert and oriented and in no cardiopulmonary distress.  HEENT: No facial asymmetry, EOMI,   Oropharynx erythematous and moist.No exudate  Neck supple no JVD, bilateral anterior cervical adenitis. TM clear bilaterally  Chest: Clear to auscultation bilaterally.Decreased though adequate air entry  CVS: S1, S2 no murmurs, no S3.Regular rate.  ABD: Soft non tender.   Ext: No edema        Assessment & Plan:  Strep pharyngitis Symptomatic with positive rapid strep test, 10 day antibiotic courseprescribed and pt education done verbally and handout provided

## 2015-02-25 NOTE — Assessment & Plan Note (Signed)
Symptomatic with positive rapid strep test, 10 day antibiotic courseprescribed and pt education done verbally and handout provided

## 2015-03-07 ENCOUNTER — Other Ambulatory Visit: Payer: Self-pay | Admitting: Family Medicine

## 2015-03-08 ENCOUNTER — Telehealth: Payer: Self-pay | Admitting: Orthopaedic Surgery

## 2015-03-08 MED ORDER — HYDROCODONE-ACETAMINOPHEN 7.5-325 MG PO TABS: 1.0000 | ORAL_TABLET | ORAL | Status: DC | PRN

## 2015-03-08 NOTE — Telephone Encounter (Signed)
Patient requesting refill on Hydrocodone 7.5/325mg   Qty 120 Tablets

## 2015-03-08 NOTE — Telephone Encounter (Signed)
Patient aware prescription available for pick up 

## 2015-03-08 NOTE — Telephone Encounter (Signed)
Rx done. 

## 2015-04-01 ENCOUNTER — Other Ambulatory Visit: Payer: Self-pay | Admitting: Family Medicine

## 2015-04-04 ENCOUNTER — Ambulatory Visit (HOSPITAL_COMMUNITY): Payer: Medicare Other | Admitting: Oncology

## 2015-04-05 ENCOUNTER — Telehealth: Payer: Self-pay | Admitting: Orthopaedic Surgery

## 2015-04-05 MED ORDER — HYDROCODONE-ACETAMINOPHEN 5-325 MG PO TABS: 1.0000 | ORAL_TABLET | ORAL | Status: DC | PRN

## 2015-04-05 NOTE — Telephone Encounter (Signed)
rx done

## 2015-04-05 NOTE — Assessment & Plan Note (Addendum)
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 Her estrogen receptor receptors were 93%, progesterone receptors 67%. HER-2/neu was negative. No lymphovascular invasion was seen. All margins were clear. Ki-67 marker was low at 1% with surgery on 11/15/2004. Treated then with post-lumpectomy radiation, finished as of 02/22/2005, followed by Aromasin beginning on 12/11/2004, which she could not tolerate. We then switched her to Femara, which she finished all therapy as of January 14, 2010.    She is a candidate for release from the [facility].  This was discussed.  Patient reports that she discussed this with her primary care provider who advised the patient to continue with annual follow-up with [place] and we are happy to continue following the patient.  No role for oncology labs today.     Next screening mammogram is due in Jan 2018.    She will return in 12 months for follow-up and I am pleased to see her back.  Smoking cessation education is provided today.  She smoke 1/4 ppd presently.  Dr. Moshe Cipro has performed lung cancer screening which was negative in 2016 and I am happy to assist Dr. Moshe Cipro in the future with this if needed.

## 2015-04-05 NOTE — Progress Notes (Signed)
[name], MD [address] [place] [address]  Malignant neoplasm of upper-inner quadrant of left female breast [facility])  CURRENT THERAPY: Surveillance per NCCN guidelines  INTERVAL HISTORY: MARSHA GUNDLACH 70 y.o. female returns for followup of Stage I (T1b N0 M0), grade 1 well-differentiated carcinoma of the left breast status post lumpectomy followed by radiation therapy. Her estrogen receptor receptors were 93%, progesterone receptors 67%. HER-2/neu was negative. No lymphovascular invasion was seen. All margins were clear. Ki-67 marker was low at 1% with surgery on 11/15/2004. Treated then with post-lumpectomy radiation, finished as of 02/22/2005, followed by Aromasin beginning on 12/11/2004, which she could not tolerate. We then switched her to Femara, which she finished all therapy as of January 14, 2010.    Breast cancer ([place])   11/15/2004 Surgery Lumpectomy    - 02/22/2005 Radiation Therapy    12/11/2004 -  Chemotherapy Aromasin    Adverse Reaction Intolerance to Aromasin    - 01/14/2010 Chemotherapy Femara    I personally reviewed and went over laboratory results with the patient.  The results are noted within this dictation.  I personally reviewed and went over radiographic studies with the patient.  The results are noted within this dictation.  Mammogram on 02/08/2015 was BIRADS 2.  She has also undergone lung screening CT by Dr. Moshe Cipro showing a Lung-RADS category of 2.  This will be repeated annually and will be due in 08/2015.  She continues to smoke 1/4 ppd of cigarettes.  She continues to work on complete cessation and she is given education and encouragement today.  She notes an episode of Strep Throat and this was managed by Dr. Moshe Cipro.  She has recovered nicely and notes that she is back to baseline.  She is S/P cholecystectomy by Dr. Arnoldo Morale.    Past Medical History  Diagnosis Date  . Hypertension   . Nicotine dependence   . COPD  (chronic obstructive pulmonary disease) ([place])   . Osteoarthritis   . Diverticula of colon   . Hyperlipidemia   . Diabetes mellitus without complication ([place])   . Kidney stones   . Breast cancer, left breast ([place])   . Breast cancer ([place]) 03/11/2012    Stage I (T1b N0 M0), grade 1 well-differentiated carcinoma of the left breast status, post lumpectomy followed by radiation therapy. Her estrogen receptor receptors were 93%, progesterone receptors 67%. HER-2/neu was negative. No lymphovascular space invasion was seen. All margins were clear. Ki-67 marker was low at 1% with surgery on 11/15/2004. Treated then with post-lumpectomy radiation, finish    has Hyperlipidemia LDL goal <100; NICOTINE ADDICTION; ADJ DISORDER WITH MIXED ANXIETY & DEPRESSED MOOD; Hypertension goal BP (blood pressure) < 130/80; COPD; DYSPEPSIA; CONSTIPATION, CHRONIC; GENERALIZED OSTEOARTHROSIS UNSPECIFIED SITE; FATIGUE; Ganglion cyst; Diabetes mellitus type 2 in obese ([place]); Breast cancer ([place]); Pulmonary nodules; postmenopausal ovarian cyst; Osteopenia; Neoplasm of uncertain behavior of ovary; Hydrosalpinx; 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[place]); Medicare annual wellness visit, subsequent; Need for vaccination with 13-polyvalent pneumococcal conjugate vaccine; Allergic rhinitis; and Strep pharyngitis on her problem list.     is allergic to penicillins and sulfonamide derivatives.  Ms. Washer had no medications administered during this visit.  Past Surgical History  Procedure Laterality Date  . Abdominal hysterectomy    . Tubal ligation    . Left breast       cancer, in 2006  . Colectomy      2005, diverticulitis  . Breast surgery  2005  approx    left lumpectomy   . Dilation and curettage of uterus    . Cholecystectomy N/A 05/26/2014    Procedure: LAPAROSCOPIC CHOLECYSTECTOMY;  Surgeon: Aviva Signs Md, MD;  Location: [facility];  Service: General;  Laterality: N/A;    Denies any headaches, dizziness, double vision, fevers, chills, night  sweats, nausea, vomiting, diarrhea, constipation, chest pain, heart palpitations, shortness of breath, blood in stool, black tarry stool, urinary pain, urinary burning, urinary frequency, hematuria.   PHYSICAL EXAMINATION  ECOG PERFORMANCE STATUS: 0 - Asymptomatic  Filed Vitals:   04/06/15 0945  BP: 153/75  Pulse: 94  Temp: 98.4 F (36.9 C)  Resp: 18    GENERAL:alert, no distress, well nourished, well developed, comfortable, cooperative and smiling, unaccompanied. SKIN: skin color, texture, turgor are normal, no rashes or significant lesions HEAD: Normocephalic, No masses, lesions, tenderness or abnormalities EYES: normal, PERRLA, EOMI, Conjunctiva are pink and non-injected EARS: External ears normal OROPHARYNX:lips, buccal mucosa, and tongue normal and mucous membranes are moist, upper dentures in place. NECK: supple, thyroid normal size, non-tender, without nodularity, no stridor, non-tender, trachea midline LYMPH:  no palpable lymphadenopathy, no hepatosplenomegaly BREAST:right breast normal without mass, skin or nipple changes or axillary nodes, left lumpectomy site well healed and free of suspicious changes LUNGS: clear to auscultation and percussion without any wheezes, rales, or rhonchi. HEART: regular rate & rhythm, no murmurs, no gallops, S1 normal and S2 normal ABDOMEN:abdomen soft, non-tender, normal bowel sounds, no masses or organomegaly and no hepatosplenomegaly BACK: Back symmetric, no curvature., No CVA tenderness EXTREMITIES:less then 2 second capillary refill, no joint deformities, effusion, or inflammation, no edema, no skin discoloration, no clubbing, no cyanosis  NEURO: alert & oriented x 3 with fluent speech, no focal motor/sensory deficits, gait normal   LABORATORY DATA: CBC    Component Value Date/Time   WBC 10.5 05/21/2014 1327   RBC 4.93 05/21/2014 1327   HGB 14.4 05/21/2014 1327   HCT 45.3 05/21/2014 1327   PLT 349 05/21/2014 1327   MCV 91.9  05/21/2014 1327   MCH 29.2 05/21/2014 1327   MCHC 31.8 05/21/2014 1327   RDW 13.3 05/21/2014 1327   LYMPHSABS 2.7 05/21/2014 1327   MONOABS 1.2* 05/21/2014 1327   EOSABS 0.3 05/21/2014 1327   BASOSABS 0.1 05/21/2014 1327      Chemistry      Component Value Date/Time   NA 139 01/04/2015 1601   K 4.6 01/04/2015 1601   CL 102 01/04/2015 1601   CO2 27 01/04/2015 1601   BUN 22 01/04/2015 1601   CREATININE 0.87 01/04/2015 1601   CREATININE 0.76 05/21/2014 1327      Component Value Date/Time   CALCIUM 10.8* 01/04/2015 1601   ALKPHOS 61 01/04/2015 1601   AST 12 01/04/2015 1601   ALT 11 01/04/2015 1601   BILITOT 0.3 01/04/2015 1601        RADIOGRAPHIC STUDIES:  CLINICAL DATA: Screening. History of left breast cancer in 2007 status post lumpectomy and radiation therapy.  EXAM: DIGITAL SCREENING BILATERAL MAMMOGRAM WITH 3D TOMO WITH CAD  COMPARISON: Previous exam(s).  ACR Breast Density Category b: There are scattered areas of fibroglandular density.  FINDINGS: There are stable postsurgical changes within the left breast. There are no findings suspicious for malignancy within either breast. Images were processed with CAD.  IMPRESSION: No mammographic evidence of malignancy. A result letter of this screening mammogram will be mailed directly to the patient.  RECOMMENDATION: Screening mammogram in one year. (Code:SM-B-01Y)  BI-RADS CATEGORY 2:  Benign.   Electronically Signed  By: Franki Cabot M.D.  On: 02/08/2015 11:48     CLINICAL DATA: 70 year old female current smoker with 30 pack-year history of smoking, for initial lung cancer screening  EXAM: CT CHEST WITHOUT CONTRAST  TECHNIQUE: Multidetector CT imaging of the chest was performed following the standard protocol without IV contrast.  COMPARISON: CT chest dated 08/31/2013  FINDINGS: Mediastinum/Nodes: Heart is normal size. No pericardial effusion.  Coronary  atherosclerosis.  Atherosclerotic calcifications of the aortic arch.  Small mediastinal lymph nodes, including a 9 mm short axis low right paratracheal node.  Visualized thyroid is notable for an 11 mm short axis left thyroid nodule.  Lungs/Pleura: Scattered small bilateral pulmonary lymph nodes measuring up to 2.3 mm (volumetric mean) in the posterior right upper lobe.  Biapical pleural parenchymal scarring.  Subpleural scarring in the anterior left upper lobe.  Mild centrilobular and paraseptal emphysematous changes.  No focal consolidation.  No pleural effusion or pneumothorax.  Upper abdomen: Visualized upper abdomen is unremarkable.  Musculoskeletal: Degenerative changes of the visualized thoracolumbar spine.  IMPRESSION: Scattered small bilateral pulmonary nodules measuring up to 2.3 mm. Lung-RADS Category 2, benign appearance or behavior. Continue annual screening with low-dose chest CT without contrast in 12 months.   Electronically Signed  By: Julian Hy M.D.  On: 09/06/2014 14:37   ASSESSMENT AND PLAN:  Breast cancer Stage I (T1b N0 M0), grade 1 well-differentiated carcinoma of the left breast status post lumpectomy followed by radiation therapy. Her estrogen receptor receptors were 93%, progesterone receptors 67%. HER-2/neu was negative. No lymphovascular invasion was seen. All margins were clear. Ki-67 marker was low at 1% with surgery on 11/15/2004. Treated then with post-lumpectomy radiation, finished as of 02/22/2005, followed by Aromasin beginning on 12/11/2004, which she could not tolerate. We then switched her to Femara, which she finished all therapy as of January 14, 2010.    She is a candidate for release from the [facility]  This was discussed.  Patient reports that she discussed this with her primary care provider who advised the patient to continue with annual follow-up with [place] and we are happy to continue following the  patient.  No role for oncology labs today.     Next screening mammogram is due in Jan 2018.    She will return in 12 months for follow-up and I am pleased to see her back.  Smoking cessation education is provided today.  She smoke 1/4 ppd presently.  Dr. Moshe Cipro has performed lung cancer screening which was negative in 2016 and I am happy to assist Dr. Moshe Cipro in the future with this if needed.    THERAPY PLAN:  NCCN guidelines recommends the following surveillance for invasive breast cancer (2.2016):  A. History and Physical exam 1-4 times per year as clinically appropriate for 5 years, then annually.  B. Periodic screening for changes in family history and referral to genetics counseling as indicated  C. Educate, monitor, and refer to lymphedema management.  D. Mammography every 12 months  E. Routine imaging of reconstructed breast is not indicated.  F. In the absence of clinical signs and symptoms suggestive of recurrent disease, there is no indication for laboratory or imaging studies for metastases screening.  G. Women on Tamoxifen: annual gynecologic assessment every 12 months if uterus is present.  H. Women on aromatase inhibitor or who experience ovarian failure secondary to treatment should have monitoring of bone health with a bone mineral density determination at baseline and periodically thereafter.  I.  Assess and encourage adherence to adjuvant endocrine therapy.  J. Evidence suggests that active lifestyle, healthy diet, limited alcohol intake, and achieving and maintaining an ideal body weight (20-25 BMI) may lead to optimal breast cancer outcomes.   All questions were answered. The patient knows to call the clinic with any problems, questions or concerns. We can certainly see the patient much sooner if necessary.  Patient and plan discussed with Dr. Ancil Linsey and she is in agreement with the aforementioned.   This note is electronically signed by:  Robynn Pane 04/06/2015 10:39 AM

## 2015-04-06 ENCOUNTER — Other Ambulatory Visit (HOSPITAL_COMMUNITY): Payer: Self-pay | Admitting: Oncology

## 2015-04-06 ENCOUNTER — Encounter (HOSPITAL_COMMUNITY): Payer: Self-pay | Admitting: Oncology

## 2015-04-06 ENCOUNTER — Encounter (HOSPITAL_COMMUNITY): Payer: Medicare Other | Attending: Oncology | Admitting: Oncology

## 2015-04-06 VITALS — BP 153/75 | HR 94 | Temp 98.4°F | Resp 18 | Wt 166.5 lb

## 2015-04-06 DIAGNOSIS — C50212 Malignant neoplasm of upper-inner quadrant of left female breast: Secondary | ICD-10-CM

## 2015-04-06 DIAGNOSIS — Z853 Personal history of malignant neoplasm of breast: Secondary | ICD-10-CM

## 2015-04-06 DIAGNOSIS — Z1231 Encounter for screening mammogram for malignant neoplasm of breast: Secondary | ICD-10-CM

## 2015-04-06 DIAGNOSIS — Z720 Tobacco use: Secondary | ICD-10-CM | POA: Diagnosis not present

## 2015-04-06 NOTE — Patient Instructions (Signed)
..  [place] at [facility] Discharge Instructions  RECOMMENDATIONS MADE BY THE CONSULTANT AND ANY TEST RESULTS WILL BE SENT TO YOUR REFERRING PHYSICIAN.  Mammogram January 2018 Return to see [place] in 12 months Continue to cut back on your smoking, We want you to completely quit by the time you return  Thank you for choosing [place] at [facility] to provide your oncology and hematology care.  To afford each patient quality time with our provider, please arrive at least 15 minutes before your scheduled appointment time.   Beginning January 23rd 2017 lab work for the [facility] will be done in the  Main lab at [facility] on 1st floor. If you have a lab appointment with the [facility] please come in thru the  Main Entrance and check in at the main information desk  You need to re-schedule your appointment should you arrive 10 or more minutes late.  We strive to give you quality time with our providers, and arriving late affects you and other patients whose appointments are after yours.  Also, if you no show three or more times for appointments you may be dismissed from the clinic at the providers discretion.     Again, thank you for choosing [facility]  Our hope is that these requests will decrease the amount of time that you wait before being seen by our physicians.       _____________________________________________________________  Should you have questions after your visit to [facility], please contact our office at [phone] between the hours of 8:30 a.m. and 4:30 p.m.  Voicemails left after 4:30 p.m. will not be returned until the following business day.  For prescription refill requests, have your pharmacy contact our office.         Resources For Cancer Patients and their Caregivers ? American Cancer Society: Can assist with transportation, wigs, general needs, runs Look Good Feel Better.         (678) 324-8071 ? Cancer Care: Provides financial assistance, online support groups, medication/co-pay assistance.  1-800-813-HOPE (727)091-7889) ? [facility] Assists [place] cancer patients and their families through emotional , educational and financial support.  847 346 2034 ? [place] DSS Where to apply for food stamps, Medicaid and utility assistance. 862-031-3803 ? [facility]: Transportation to medical appointments. 878-244-1598 ? Social Security Administration: May apply for disability if have a Stage IV cancer. (646)854-8241 (843) 124-5386 ? [employer], Disability and Transit Services: Assists with nutrition, care and transit needs. (316)437-1911

## 2015-04-07 ENCOUNTER — Other Ambulatory Visit: Payer: Self-pay | Admitting: Family Medicine

## 2015-04-25 ENCOUNTER — Telehealth: Payer: Self-pay

## 2015-04-25 DIAGNOSIS — I10 Essential (primary) hypertension: Secondary | ICD-10-CM | POA: Diagnosis not present

## 2015-04-25 DIAGNOSIS — E669 Obesity, unspecified: Secondary | ICD-10-CM | POA: Diagnosis not present

## 2015-04-25 DIAGNOSIS — E119 Type 2 diabetes mellitus without complications: Secondary | ICD-10-CM | POA: Diagnosis not present

## 2015-04-25 DIAGNOSIS — E785 Hyperlipidemia, unspecified: Secondary | ICD-10-CM | POA: Diagnosis not present

## 2015-04-25 DIAGNOSIS — E1169 Type 2 diabetes mellitus with other specified complication: Secondary | ICD-10-CM

## 2015-04-25 LAB — COMPLETE METABOLIC PANEL WITH GFR
ALBUMIN: 4.4 g/dL (ref 3.6–5.1)
ALK PHOS: 64 U/L (ref 33–130)
ALT: 13 U/L (ref 6–29)
AST: 13 U/L (ref 10–35)
BILIRUBIN TOTAL: 0.4 mg/dL (ref 0.2–1.2)
BUN: 15 mg/dL (ref 7–25)
CO2: 28 mmol/L (ref 20–31)
Calcium: 10.1 mg/dL (ref 8.6–10.4)
Chloride: 105 mmol/L (ref 98–110)
Creat: 0.71 mg/dL (ref 0.60–0.93)
GFR, EST NON AFRICAN AMERICAN: 87 mL/min (ref >=60)
Glucose, Bld: 104 mg/dL — ABNORMAL HIGH (ref 65–99)
Potassium: 4.7 mmol/L (ref 3.5–5.3)
Sodium: 143 mmol/L (ref 135–146)
TOTAL PROTEIN: 7 g/dL (ref 6.1–8.1)

## 2015-04-25 LAB — HEMOGLOBIN A1C
Hgb A1c MFr Bld: 5.9 % — ABNORMAL HIGH (ref <5.7)
Mean Plasma Glucose: 123 mg/dL

## 2015-04-25 LAB — LIPID PANEL
CHOL/HDL RATIO: 2.6 ratio (ref <=5.0)
CHOLESTEROL: 175 mg/dL (ref 125–200)
HDL: 68 mg/dL (ref >=46)
LDL Cholesterol: 80 mg/dL (ref <130)
TRIGLYCERIDES: 134 mg/dL (ref <150)
VLDL: 27 mg/dL (ref <30)

## 2015-04-25 NOTE — Telephone Encounter (Signed)
Lab order placed.

## 2015-04-27 ENCOUNTER — Other Ambulatory Visit: Payer: Self-pay | Admitting: Family Medicine

## 2015-04-28 ENCOUNTER — Encounter: Payer: Self-pay | Admitting: Family Medicine

## 2015-04-28 ENCOUNTER — Ambulatory Visit (INDEPENDENT_AMBULATORY_CARE_PROVIDER_SITE_OTHER): Payer: Medicare Other | Admitting: Family Medicine

## 2015-04-28 VITALS — BP 130/80 | HR 74 | Resp 18 | Wt 164.0 lb

## 2015-04-28 DIAGNOSIS — E785 Hyperlipidemia, unspecified: Secondary | ICD-10-CM

## 2015-04-28 DIAGNOSIS — E669 Obesity, unspecified: Secondary | ICD-10-CM

## 2015-04-28 DIAGNOSIS — J3089 Other allergic rhinitis: Secondary | ICD-10-CM | POA: Diagnosis not present

## 2015-04-28 DIAGNOSIS — E119 Type 2 diabetes mellitus without complications: Secondary | ICD-10-CM | POA: Diagnosis not present

## 2015-04-28 DIAGNOSIS — E1169 Type 2 diabetes mellitus with other specified complication: Secondary | ICD-10-CM

## 2015-04-28 DIAGNOSIS — F17200 Nicotine dependence, unspecified, uncomplicated: Secondary | ICD-10-CM

## 2015-04-28 DIAGNOSIS — E559 Vitamin D deficiency, unspecified: Secondary | ICD-10-CM

## 2015-04-28 DIAGNOSIS — I10 Essential (primary) hypertension: Secondary | ICD-10-CM | POA: Diagnosis not present

## 2015-04-28 DIAGNOSIS — Z87891 Personal history of nicotine dependence: Secondary | ICD-10-CM

## 2015-04-28 MED ORDER — PREDNISONE 5 MG PO TABS: 5.0000 mg | ORAL_TABLET | Freq: Two times a day (BID) | ORAL | Status: AC

## 2015-04-28 NOTE — Patient Instructions (Addendum)
F/u with rectal in 4.5 month, call if you need me before   Prednisone sent for 5 days for allergies  NO MORE METFORMIN AFTER you finish what you HAVE  Excellent labs!   HbA1c, cbc, CHEM 7 AND egfr, vIT d  IN 4.5 month  PLEASE work on cutting back cigarettes, back down to 15 / day   You are referred for low dose chest scan   You Can Quit Smoking If you are ready to quit smoking or are thinking about it, congratulations! You have chosen to help yourself be healthier and live longer! There are lots of different ways to quit smoking. Nicotine gum, nicotine patches, a nicotine inhaler, or nicotine nasal spray can help with physical craving. Hypnosis, support groups, and medicines help break the habit of smoking. TIPS TO GET OFF AND STAY OFF CIGARETTES  Learn to predict your moods. Do not let a bad situation be your excuse to have a cigarette. Some situations in your life might tempt you to have a cigarette.  Ask friends and co-workers not to smoke around you.  Make your home smoke-free.  Never have "just one" cigarette. It leads to wanting another and another. Remind yourself of your decision to quit.  On a card, make a list of your reasons for not smoking. Read it at least the same number of times a day as you have a cigarette. Tell yourself everyday, "I do not want to smoke. I choose not to smoke."  Ask someone at home or work to help you with your plan to quit smoking.  Have something planned after you eat or have a cup of coffee. Take a walk or get other exercise to perk you up. This will help to keep you from overeating.  Try a relaxation exercise to calm you down and decrease your stress. Remember, you may be tense and nervous the first two weeks after you quit. This will pass.  Find new activities to keep your hands busy. Play with a pen, coin, or rubber band. Doodle or draw things on paper.  Brush your teeth right after eating. This will help cut down the craving for the  taste of tobacco after meals. You can try mouthwash too.  Try gum, breath mints, or diet candy to keep something in your mouth. IF YOU SMOKE AND WANT TO QUIT:  Do not stock up on cigarettes. Never buy a carton. Wait until one pack is finished before you buy another.  Never carry cigarettes with you at work or at home.  Keep cigarettes as far away from you as possible. Leave them with someone else.  Never carry matches or a lighter with you.  Ask yourself, "Do I need this cigarette or is this just a reflex?"  Bet with someone that you can quit. Put cigarette money in a piggy bank every morning. If you smoke, you give up the money. If you do not smoke, by the end of the week, you keep the money.  Keep trying. It takes 21 days to change a habit!  Talk to your doctor about using medicines to help you quit. These include nicotine replacement gum, lozenges, or skin patches.   This information is not intended to replace advice given to you by your health care provider. Make sure you discuss any questions you have with your health care provider.   Document Released: 10/28/2008 Document Revised: 03/26/2011 Document Reviewed: 10/28/2008 Elsevier Interactive Patient Education [employer].

## 2015-04-28 NOTE — Progress Notes (Signed)
Subjective:    Patient ID: [name], female    DOB: 1945/07/10, 70 y.o.   MRN: [id number]  HPI    [name]     MRN: [id number]      DOB: 11-20-1945   HPI [name] is here for follow up and re-evaluation of chronic medical conditions, medication management and review of any available recent lab and radiology data.  Preventive health is updated, specifically  Cancer screening and Immunization.   Questions or concerns regarding consultations or procedures which the PT has had in the interim are  Addressed.Recent reduced dosage of hydrocodone by orhto which I reviewed the benefit of with her. The PT denies any adverse reactions to current medications since the last visit.  Denies polyuria, polydipsia, blurred vision , or hypoglycemic episodes.   ROS Denies recent fever or chills. C/po mild  sinus pressure and  nasal congestion,denies ear pain or sore throat. Denies chest congestion, productive cough or wheezing. Denies chest pains, palpitations and leg swelling Denies abdominal pain, nausea, vomiting,diarrhea or constipation.   Denies dysuria, frequency, hesitancy or incontinence. Denies uncontrolled  joint pain, swelling and limitation in mobility. Denies headaches, seizures, numbness, or tingling. Denies depression, anxiety or insomnia. Denies skin break down or rash.   PE  BP 130/80 mmHg  Pulse 74  Resp 18  Wt 164 lb (74.39 kg)  SpO2 96%  Patient alert and oriented and in no cardiopulmonary distress.  HEENT: No facial asymmetry, EOMI,   oropharynx pink and moist.  Neck supple no JVD, no mass. No sinus tenderness, excess swelling and erythema of nasal mucosa, with clear drainage, TM clear bilaterally Chest: Clear to auscultation bilaterally.Decreased though adequate air entry  CVS: S1, S2 no murmurs, no S3.Regular rate.  ABD: Soft non tender.   Ext: No edema  MS: Adequate ROM spine, shoulders, hips and knees.  Skin: Intact, no ulcerations or rash  noted.  Psych: Good eye contact, normal affect. Memory intact not anxious or depressed appearing.  CNS: CN 2-12 intact, power,  normal throughout.no focal deficits noted.   Assessment & Plan  Hypertension goal BP (blood pressure) < 130/80 Controlled, no change in medication DASH diet and commitment to daily physical activity for a minimum of 30 minutes discussed and encouraged, as a part of hypertension management. The importance of attaining a healthy weight is also discussed.  BP/Weight 04/28/2015 04/06/2015 02/15/2015 01/04/2015 10/25/2014 08/31/2014 [id number]  Systolic BP [id number] 123456 [id number] Q000111Q [id number] Q000111Q  Diastolic BP 80 75 78 74 62 78 80  Wt. (Lbs) 164 166.5 163.12 160 164.2 165 156  BMI 28.14 28.57 27.99 27.45 28.17 28.31 26.76        Allergic rhinitis Current flare due to excess pollen, 5 day course of oral prednisone , and daily use of maintenance  meds  COPD Deteriorating due to ongoing nicotine, counseled to quit  Diabetes mellitus type 2 in obese (HCC) Improved, discontinue metformin  [name] is reminded of the importance of commitment to daily physical activity for 30 minutes or more, as able and the need to limit carbohydrate intake to 30 to 60 grams per meal to help with blood sugar control.     [name] is reminded of the importance of daily foot exam, annual eye examination, and good blood sugar, blood pressure and cholesterol control.  Diabetic Labs Latest Ref Rng 04/25/2015 01/04/2015 08/27/2014 05/21/2014 05/05/2014  HbA1c <5.7 % 5.9(H) 5.9(H) 6.0(H) - -  Microalbumin <2.0 mg/dL - - - -  4.2(H)  Micro/Creat Ratio 0.0 - 30.0 mg/g - - - - 84.3(H)  Chol 125 - 200 mg/dL 175 - 198 - -  HDL >=46 mg/dL 68 - 69 - -  Calc LDL <130 mg/dL 80 - 90 - -  Triglycerides <150 mg/dL 134 - 193(H) - -  Creatinine 0.60 - 0.93 mg/dL 0.71 0.87 0.73 0.76 -   BP/Weight 04/28/2015 04/06/2015 02/15/2015 01/04/2015 10/25/2014 08/31/2014 [id number]  Systolic BP [id number] 0000000 123456 [id number] 150 XX123456 Q000111Q    Diastolic BP 80 75 78 74 62 78 80  Wt. (Lbs) 164 166.5 163.12 160 164.2 165 156  BMI 28.14 28.57 27.99 27.45 28.17 28.31 26.76   Foot/eye exam completion dates Latest Ref Rng 09/07/2014 06/10/2013  Eye Exam No Retinopathy No Retinopathy -  Foot Form Completion - - Done         NICOTINE ADDICTION Patient counseled for approximately 5 minutes regarding the health risks of ongoing nicotine use, specifically all types of cancer, heart disease, stroke and respiratory failure. The options available for help with cessation ,the behavioral changes to assist the process, and the option to either gradully reduce usage  Or abruptly stop.is also discussed. Pt is also encouraged to set specific goals in number of cigarettes used daily, as well as to set a quit date.  Number of cigarettes/cigars currently smoking daily: 10   Hyperlipidemia LDL goal <100 Hyperlipidemia:Low fat diet discussed and encouraged.   Lipid Panel  Lab Results  Component Value Date   CHOL 175 04/25/2015   HDL 68 04/25/2015   LDLCALC 80 04/25/2015   TRIG 134 04/25/2015   CHOLHDL 2.6 04/25/2015   Controlled, no change in medication        Review of Systems     Objective:   Physical Exam        Assessment & Plan:

## 2015-04-30 ENCOUNTER — Encounter: Payer: Self-pay | Admitting: Family Medicine

## 2015-04-30 NOTE — Assessment & Plan Note (Signed)
Current flare due to excess pollen, 5 day course of oral prednisone , and daily use of maintenance  meds

## 2015-04-30 NOTE — Assessment & Plan Note (Addendum)
Improved, discontinue metformin  [name] is reminded of the importance of commitment to daily physical activity for 30 minutes or more, as able and the need to limit carbohydrate intake to 30 to 60 grams per meal to help with blood sugar control.     [name] is reminded of the importance of daily foot exam, annual eye examination, and good blood sugar, blood pressure and cholesterol control.  Diabetic Labs Latest Ref Rng 04/25/2015 01/04/2015 08/27/2014 05/21/2014 05/05/2014  HbA1c <5.7 % 5.9(H) 5.9(H) 6.0(H) - -  Microalbumin <2.0 mg/dL - - - - 4.2(H)  Micro/Creat Ratio 0.0 - 30.0 mg/g - - - - 84.3(H)  Chol 125 - 200 mg/dL 175 - 198 - -  HDL >=46 mg/dL 68 - 69 - -  Calc LDL <130 mg/dL 80 - 90 - -  Triglycerides <150 mg/dL 134 - 193(H) - -  Creatinine 0.60 - 0.93 mg/dL 0.71 0.87 0.73 0.76 -   BP/Weight 04/28/2015 04/06/2015 02/15/2015 01/04/2015 10/25/2014 08/31/2014 [id number]  Systolic BP [id number] 0000000 123456 [id number] Q000111Q [id number] Q000111Q  Diastolic BP 80 75 78 74 62 78 80  Wt. (Lbs) 164 166.5 163.12 160 164.2 165 156  BMI 28.14 28.57 27.99 27.45 28.17 28.31 26.76   Foot/eye exam completion dates Latest Ref Rng 09/07/2014 06/10/2013  Eye Exam No Retinopathy No Retinopathy -  Foot Form Completion - - Done

## 2015-04-30 NOTE — Assessment & Plan Note (Signed)
Controlled, no change in medication DASH diet and commitment to daily physical activity for a minimum of 30 minutes discussed and encouraged, as a part of hypertension management. The importance of attaining a healthy weight is also discussed.  BP/Weight 04/28/2015 04/06/2015 02/15/2015 01/04/2015 10/25/2014 08/31/2014 [id number]  Systolic BP [id number]  Diastolic BP 80 75 78 74 62 78 80  Wt. (Lbs) 164 166.5 163.12 160 164.2 165 156  BMI 28.14 28.57 27.99 27.45 28.17 28.31 26.76

## 2015-04-30 NOTE — Assessment & Plan Note (Signed)

## 2015-04-30 NOTE — Assessment & Plan Note (Signed)
Hyperlipidemia:Low fat diet discussed and encouraged.   Lipid Panel  Lab Results  Component Value Date   CHOL 175 04/25/2015   HDL 68 04/25/2015   LDLCALC 80 04/25/2015   TRIG 134 04/25/2015   CHOLHDL 2.6 04/25/2015   Controlled, no change in medication

## 2015-04-30 NOTE — Assessment & Plan Note (Signed)
Deteriorating due to ongoing nicotine, counseled to quit

## 2015-05-03 ENCOUNTER — Other Ambulatory Visit: Payer: Self-pay | Admitting: Orthopaedic Surgery

## 2015-05-03 NOTE — Telephone Encounter (Signed)
Patient requesting refill of Hydrocodone 7.5.  Last script was for weaker strength and not working - has been taking 1.5 pills to reach comfort level.

## 2015-05-03 NOTE — Telephone Encounter (Signed)
Not due until 4-21.  Tell me again on 4-20.    She will continue to get the lower dose.

## 2015-05-05 ENCOUNTER — Telehealth: Payer: Self-pay | Admitting: Orthopaedic Surgery

## 2015-05-05 MED ORDER — HYDROCODONE-ACETAMINOPHEN 5-325 MG PO TABS: 1.0000 | ORAL_TABLET | ORAL | Status: DC | PRN

## 2015-05-05 NOTE — Telephone Encounter (Signed)
No additional note

## 2015-05-05 NOTE — Telephone Encounter (Signed)
Rx done. 

## 2015-05-05 NOTE — Telephone Encounter (Signed)
This request already addressed in phone note of 05/03/15.

## 2015-05-05 NOTE — Telephone Encounter (Signed)
Patient requesting Hydrocodone refill

## 2015-05-05 NOTE — Telephone Encounter (Signed)
done

## 2015-05-05 NOTE — Telephone Encounter (Signed)
Routing on 05/05/15 per Dr Brooke Bonito note.

## 2015-05-06 ENCOUNTER — Ambulatory Visit (HOSPITAL_COMMUNITY)
Admission: RE | Admit: 2015-05-06 | Discharge: 2015-05-06 | Disposition: A | Discharge status: Home / Self Care | Payer: Medicare Other | Source: Ambulatory Visit | Attending: Family Medicine | Admitting: Family Medicine

## 2015-05-06 DIAGNOSIS — Z87891 Personal history of nicotine dependence: Secondary | ICD-10-CM

## 2015-05-11 ENCOUNTER — Encounter: Payer: Self-pay | Admitting: Orthopaedic Surgery

## 2015-05-11 ENCOUNTER — Ambulatory Visit (INDEPENDENT_AMBULATORY_CARE_PROVIDER_SITE_OTHER): Payer: Medicare Other | Admitting: Orthopaedic Surgery

## 2015-05-11 VITALS — BP 145/66 | HR 90 | Temp 97.9°F | Resp 16 | Ht 64.0 in | Wt 164.0 lb

## 2015-05-11 DIAGNOSIS — E119 Type 2 diabetes mellitus without complications: Secondary | ICD-10-CM | POA: Diagnosis not present

## 2015-05-11 DIAGNOSIS — E669 Obesity, unspecified: Secondary | ICD-10-CM | POA: Diagnosis not present

## 2015-05-11 DIAGNOSIS — J3089 Other allergic rhinitis: Secondary | ICD-10-CM | POA: Diagnosis not present

## 2015-05-11 DIAGNOSIS — M7061 Trochanteric bursitis, right hip: Secondary | ICD-10-CM | POA: Diagnosis not present

## 2015-05-11 DIAGNOSIS — E1169 Type 2 diabetes mellitus with other specified complication: Secondary | ICD-10-CM

## 2015-05-11 NOTE — Progress Notes (Signed)
Patient [name], female DOB:02/04/1945, 70 y.o. TWK:[id number]  Chief Complaint  Patient presents with  . Follow-up    shoulder, knee, hip    HPI  [name] is a 70 y.o. female who has chronic pain of the right hip, right knee and right lateral foot.  She has had increasing pain of the right hip with some radiation laterally.  She has no redness, no trauma, no swelling.  She has chronic pain of the right knee with effusion and popping.  She has no trauma.  She has no giving way, no redness, no locking.  The right lateral ankle and foot are chronically tender with chronic swelling, no redness.  She uses ice and heat and takes her medicine.  HPI  Body mass index is 28.14 kg/(m^2).  Review of Systems  HENT: Negative for congestion.   Respiratory: Positive for shortness of breath. Negative for cough.   Cardiovascular: Negative for chest pain and leg swelling.  Endocrine: Positive for cold intolerance.  Musculoskeletal: Positive for myalgias, joint swelling, arthralgias and gait problem.  Allergic/Immunologic: Positive for environmental allergies.    Past Medical History  Diagnosis Date  . Hypertension   . Nicotine dependence   . COPD (chronic obstructive pulmonary disease) ([place])   . Osteoarthritis   . Diverticula of colon   . Hyperlipidemia   . Diabetes mellitus without complication ([place])   . Kidney stones   . Breast cancer, left breast ([place])   . Breast cancer ([place]) 03/11/2012    Stage I (T1b N0 M0), grade 1 well-differentiated carcinoma of the left breast status, post lumpectomy followed by radiation therapy. Her estrogen receptor receptors were 93%, progesterone receptors 67%. HER-2/neu was negative. No lymphovascular space invasion was seen. All margins were clear. Ki-67 marker was low at 1% with surgery on 11/15/2004. Treated then with post-lumpectomy radiation, finish    Past Surgical History  Procedure Laterality Date  . Abdominal hysterectomy    . Tubal  ligation    . Left breast       cancer, in 2006  . Colectomy      2005, diverticulitis  . Breast surgery  2005 approx    left lumpectomy   . Dilation and curettage of uterus    . Cholecystectomy N/A 05/26/2014    Procedure: LAPAROSCOPIC CHOLECYSTECTOMY;  Surgeon: Aviva Signs Md, MD;  Location: [facility];  Service: General;  Laterality: N/A;    Family History  Problem Relation Age of Onset  . Cancer Mother     breast  . COPD Father   . Cancer Sister     cancer    Social History Social History  Substance Use Topics  . Smoking status: Current Every Day Smoker -- 0.50 packs/day for 50 years    Types: Cigarettes  . Smokeless tobacco: Never Used     Comment: cigaretes  now 10 to 15 per day  . Alcohol Use: No    Allergies  Allergen Reactions  . Penicillins Hives, Shortness Of Breath and Swelling  . Sulfonamide Derivatives Other (See Comments)    Shaking all over, seizure like symptoms. Hospitalization resulted     Current Outpatient Prescriptions  Medication Sig Dispense Refill  . acetaminophen (TYLENOL) 500 MG tablet Take 500-1,000 mg by mouth every 6 (six) hours as needed for moderate pain.     Marland Kitchen albuterol (PROVENTIL) (2.5 MG/3ML) 0.083% nebulizer solution USE 1 VIAL IN NEBULIZER 3 TIMES DAILY. 300 mL 0  . amLODipine (NORVASC) 5 MG tablet TAKE  1 TABLET BY MOUTH ONCE A DAY. 30 tablet 3  . aspirin EC 81 MG tablet Take 81 mg by mouth daily.    . ATROVENT HFA 17 MCG/ACT inhaler INHALE 2 PUFFS BY MOUTH FOUR TIMES A DAY. 12.9 g 3  . azelastine (ASTELIN) 0.1 % nasal spray Place 2 sprays into both nostrils 2 (two) times daily. Use in each nostril as directed 30 mL 12  . benazepril (LOTENSIN) 40 MG tablet TAKE ONE TABLET BY MOUTH DAILY. 90 tablet 1  . Calcium Carbonate-Vitamin D (CALCIUM 600 + D PO) Take 1 tablet by mouth 2 (two) times daily.     . fluticasone (FLONASE) 50 MCG/ACT nasal spray Place 2 sprays into both nostrils 2 (two) times daily. 16 g 5  . HYDROcodone-acetaminophen  (NORCO/VICODIN) 5-325 MG tablet Take 1 tablet by mouth every 4 (four) hours as needed for moderate pain (Must last 30 days.  Do not take and drive a car or use machinery.). 120 tablet 0  . loratadine (CLARITIN) 10 MG tablet TAKE 1 TABLET BY MOUTH ONCE DAILY FOR ALLERGIES. 30 tablet 6  . montelukast (SINGULAIR) 10 MG tablet Take 1 tablet (10 mg total) by mouth at bedtime. 30 tablet 3  . Omega-3 Fatty Acids (FISH OIL) 1000 MG CAPS Take 2 capsules by mouth daily.    . polyethylene glycol powder (GLYCOLAX/MIRALAX) powder MIX 1 CAPFUL IN 8 OUNCES OF JUICE OR WATER AND DRINK ONCE DAILY. 527 g 3  . potassium chloride SA (K-DUR,KLOR-CON) 20 MEQ tablet TAKE 1 TABLET BY MOUTH DAILY. 30 tablet 2  . simvastatin (ZOCOR) 20 MG tablet TAKE (1) TABLET BY MOUTH DAILY AT 6 PM FOR CHOLESTEROL. 30 tablet 2  . SYMBICORT 160-4.5 MCG/ACT inhaler INHALE 2 PUFFS INTO THE LUNGS TWICE DAILY. 10.2 g 2  . tiZANidine (ZANAFLEX) 4 MG tablet      Current Facility-Administered Medications  Medication Dose Route Frequency Provider Last Rate Last Dose  . Influenza (>/= 3 years) inactive virus vaccine (FLVIRIN/FLUZONE) injection SUSP 0.5 mL  0.5 mL Intramuscular Once [name], MD         Physical Exam  Blood pressure 145/66, pulse 90, temperature 97.9 F (36.6 C), resp. rate 16, height '5\' 4"'  (1.626 m), weight 164 lb (74.39 kg).  Constitutional: overall normal hygiene, normal nutrition, well developed, normal grooming, normal body habitus. Assistive device:none  Musculoskeletal: gait and station Limp right, muscle tone and strength are normal, no tremors or atrophy is present.  .  Neurological: coordination overall normal.  Deep tendon reflex/nerve stretch intact.  Sensation normal.  Cranial nerves II-XII intact.   Skin:   normal overall no scars, lesions, ulcers or rashes. No psoriasis.  Psychiatric: Alert and oriented x 3.  Recent memory intact, remote memory unclear.  Normal mood and affect. Well groomed.   Good eye contact.  Cardiovascular: overall no swelling, no varicosities, no edema bilaterally, normal temperatures of the legs and arms, no clubbing, cyanosis and good capillary refill.  Lymphatic: palpation is normal.  Right Hip Exam   Tenderness  The patient is experiencing tenderness in the greater trochanter.  Range of Motion  The patient has normal right hip ROM.  Muscle Strength  The patient has normal right hip strength.  Other  Erythema: absent Scars: absent Sensation: normal Pulse: present  Comments:  She is very tender over the right trochanteric bursa area.    Left Hip Exam  Left hip exam is normal.    I have talked to her about  her smoking.  She is not willing to stop.  Her hypertension is well controlled.  She has no leg swelling or problems.  Her diabetes is also well controlled with acceptable blood sugars.  The patient has been educated about the nature of the problem(s) and counseled on treatment options.  The patient appeared to understand what I have discussed and is in agreement with it.  Encounter Diagnoses  Name Primary?  . Trochanteric bursitis of right hip Yes  . Other allergic rhinitis   . Diabetes mellitus type 2 in obese [facility])    PROCEDURE NOTE:  The patient request injection, verbal consent was obtained.  The right trochanteric area of the hip was prepped appropriately after time out was performed.   Sterile technique was observed and injection of 1 cc of Depo-Medrol 40 mg with several cc's of plain xylocaine. Anesthesia was provided by ethyl chloride and a 20-gauge needle was used to inject the hip area. The injection was tolerated well.  A band aid dressing was applied.  The patient was advised to apply ice later today and tomorrow to the injection sight as needed.  PLAN Call if any problems.  Precautions discussed.  Continue current medications.   Return to clinic 1 month

## 2015-05-16 ENCOUNTER — Telehealth: Payer: Self-pay | Admitting: Family Medicine

## 2015-05-16 NOTE — Telephone Encounter (Signed)
If able, pls cancel the referral for her chest scan, does not qualify per screening guidelines. States she tried calling on 4/21 but was told the office was closed??? Anderson Malta in radiology, pls f/u on this as we discussed)

## 2015-05-16 NOTE — Telephone Encounter (Signed)
Referral and order for the chest scan has been closed

## 2015-05-16 NOTE — Addendum Note (Signed)
Addended by: Eual Fines on: 05/16/2015 09:48 AM   Modules accepted: Orders

## 2015-06-02 ENCOUNTER — Telehealth: Payer: Self-pay | Admitting: Orthopaedic Surgery

## 2015-06-02 MED ORDER — HYDROCODONE-ACETAMINOPHEN 5-325 MG PO TABS: 1.0000 | ORAL_TABLET | ORAL | Status: DC | PRN

## 2015-06-02 NOTE — Telephone Encounter (Signed)
Rx done. 

## 2015-06-02 NOTE — Telephone Encounter (Signed)
Patient called and requested a refill on Hydrocodone-Acetaminophen 5-325 mgs  Qty  120  Sig: Take 1 tablet by mouth every 4 (four) hours as needed for moderate pain (Must last 30 days.  Do not take and drive a car or use machinery.). °

## 2015-06-07 ENCOUNTER — Other Ambulatory Visit: Payer: Self-pay | Admitting: Family Medicine

## 2015-06-08 ENCOUNTER — Encounter: Payer: Self-pay | Admitting: Orthopaedic Surgery

## 2015-06-08 ENCOUNTER — Ambulatory Visit (INDEPENDENT_AMBULATORY_CARE_PROVIDER_SITE_OTHER): Payer: Medicare Other | Admitting: Orthopaedic Surgery

## 2015-06-08 VITALS — BP 161/80 | HR 97 | Temp 98.4°F | Ht 64.0 in | Wt 165.0 lb

## 2015-06-08 DIAGNOSIS — E119 Type 2 diabetes mellitus without complications: Secondary | ICD-10-CM | POA: Diagnosis not present

## 2015-06-08 DIAGNOSIS — E669 Obesity, unspecified: Secondary | ICD-10-CM | POA: Diagnosis not present

## 2015-06-08 DIAGNOSIS — M7061 Trochanteric bursitis, right hip: Secondary | ICD-10-CM

## 2015-06-08 DIAGNOSIS — E1169 Type 2 diabetes mellitus with other specified complication: Secondary | ICD-10-CM

## 2015-06-08 NOTE — Progress Notes (Signed)
Patient [name], female DOB:03/03/1945, 70 y.o. IRS:[id number]  Chief Complaint  Patient presents with  . Follow-up    Right hip pain    HPI  [name] is a 70 y.o. female who has had right trochanteric bursitis.  She is a little better today. The injection last time helped. She has no new trauma, no redness, no falls. She is active.  HPI  Body mass index is 28.31 kg/(m^2).  ROS  Review of Systems  HENT: Negative for congestion.   Respiratory: Positive for shortness of breath. Negative for cough.   Cardiovascular: Negative for chest pain and leg swelling.  Endocrine: Positive for cold intolerance.  Musculoskeletal: Positive for myalgias, joint swelling, arthralgias and gait problem.  Allergic/Immunologic: Positive for environmental allergies.    Past Medical History  Diagnosis Date  . Hypertension   . Nicotine dependence   . COPD (chronic obstructive pulmonary disease) ([place])   . Osteoarthritis   . Diverticula of colon   . Hyperlipidemia   . Diabetes mellitus without complication ([place])   . Kidney stones   . Breast cancer, left breast ([place])   . Breast cancer ([place]) 03/11/2012    Stage I (T1b N0 M0), grade 1 well-differentiated carcinoma of the left breast status, post lumpectomy followed by radiation therapy. Her estrogen receptor receptors were 93%, progesterone receptors 67%. HER-2/neu was negative. No lymphovascular space invasion was seen. All margins were clear. Ki-67 marker was low at 1% with surgery on 11/15/2004. Treated then with post-lumpectomy radiation, finish    Past Surgical History  Procedure Laterality Date  . Abdominal hysterectomy    . Tubal ligation    . Left breast       cancer, in 2006  . Colectomy      2005, diverticulitis  . Breast surgery  2005 approx    left lumpectomy   . Dilation and curettage of uterus    . Cholecystectomy N/A 05/26/2014    Procedure: LAPAROSCOPIC CHOLECYSTECTOMY;  Surgeon: Aviva Signs Md, MD;  Location: [facility];  Service: General;  Laterality: N/A;    Family History  Problem Relation Age of Onset  . Cancer Mother     breast  . COPD Father   . Cancer Sister     cancer    Social History Social History  Substance Use Topics  . Smoking status: Current Every Day Smoker -- 0.50 packs/day for 50 years    Types: Cigarettes  . Smokeless tobacco: Never Used     Comment: cigaretes  now 10 to 15 per day  . Alcohol Use: No    Allergies  Allergen Reactions  . Penicillins Hives, Shortness Of Breath and Swelling  . Sulfonamide Derivatives Other (See Comments)    Shaking all over, seizure like symptoms. Hospitalization resulted     Current Outpatient Prescriptions  Medication Sig Dispense Refill  . acetaminophen (TYLENOL) 500 MG tablet Take 500-1,000 mg by mouth every 6 (six) hours as needed for moderate pain.     Marland Kitchen albuterol (PROVENTIL) (2.5 MG/3ML) 0.083% nebulizer solution USE 1 VIAL IN NEBULIZER 3 TIMES DAILY. 300 mL 0  . amLODipine (NORVASC) 5 MG tablet TAKE 1 TABLET BY MOUTH ONCE A DAY. 30 tablet 3  . aspirin EC 81 MG tablet Take 81 mg by mouth daily.    . ATROVENT HFA 17 MCG/ACT inhaler INHALE 2 PUFFS BY MOUTH FOUR TIMES A DAY. 12.9 g 3  . azelastine (ASTELIN) 0.1 % nasal spray Place 2 sprays into both nostrils  2 (two) times daily. Use in each nostril as directed 30 mL 12  . benazepril (LOTENSIN) 40 MG tablet TAKE ONE TABLET BY MOUTH DAILY. 90 tablet 1  . Calcium Carbonate-Vitamin D (CALCIUM 600 + D PO) Take 1 tablet by mouth 2 (two) times daily.     . fluticasone (FLONASE) 50 MCG/ACT nasal spray Place 2 sprays into both nostrils 2 (two) times daily. 16 g 5  . HYDROcodone-acetaminophen (NORCO/VICODIN) 5-325 MG tablet Take 1 tablet by mouth every 4 (four) hours as needed for moderate pain (Must last 30 days.  Do not take and drive a car or use machinery.). 120 tablet 0  . loratadine (CLARITIN) 10 MG tablet TAKE 1 TABLET BY MOUTH ONCE DAILY FOR ALLERGIES. 30 tablet 6  . montelukast  (SINGULAIR) 10 MG tablet Take 1 tablet (10 mg total) by mouth at bedtime. 30 tablet 3  . Omega-3 Fatty Acids (FISH OIL) 1000 MG CAPS Take 2 capsules by mouth daily.    . polyethylene glycol powder (GLYCOLAX/MIRALAX) powder MIX 1 CAPFUL IN 8 OUNCES OF JUICE OR WATER AND DRINK ONCE DAILY. 527 g 3  . potassium chloride SA (K-DUR,KLOR-CON) 20 MEQ tablet TAKE 1 TABLET BY MOUTH DAILY. 30 tablet 2  . simvastatin (ZOCOR) 20 MG tablet TAKE (1) TABLET BY MOUTH DAILY AT 6 PM FOR CHOLESTEROL. 30 tablet 2  . SYMBICORT 160-4.5 MCG/ACT inhaler INHALE 2 PUFFS INTO THE LUNGS TWICE DAILY. 10.2 g 2  . tiZANidine (ZANAFLEX) 4 MG tablet      Current Facility-Administered Medications  Medication Dose Route Frequency Provider Last Rate Last Dose  . Influenza (>/= 3 years) inactive virus vaccine (FLVIRIN/FLUZONE) injection SUSP 0.5 mL  0.5 mL Intramuscular Once [name], MD         Physical Exam  Blood pressure 161/80, pulse 97, temperature 98.4 F (36.9 C), height _0  (1.626 m), weight 165 lb (74.844 kg).  Constitutional: overall normal hygiene, normal nutrition, well developed, normal grooming, normal body habitus. Assistive device:none  Musculoskeletal: gait and station Limp right, muscle tone and strength are normal, no tremors or atrophy is present.  .  Neurological: coordination overall normal.  Deep tendon reflex/nerve stretch intact.  Sensation normal.  Cranial nerves II-XII intact.   Skin:   normal overall no scars, lesions, ulcers or rashes. No psoriasis.  Psychiatric: Alert and oriented x 3.  Recent memory intact, remote memory unclear.  Normal mood and affect. Well groomed.  Good eye contact.  Cardiovascular: overall no swelling, no varicosities, no edema bilaterally, normal temperatures of the legs and arms, no clubbing, cyanosis and good capillary refill.  Lymphatic: palpation is normal.  She has some tenderness of the right hip over the lateral trochanteric area. She has no  redness. ROM is full of both hips.  Gait is a slight limp to the right.  NV is intact.  The patient has been educated about the nature of the problem(s) and counseled on treatment options.  The patient appeared to understand what I have discussed and is in agreement with it.  Encounter Diagnoses  Name Primary?  . Trochanteric bursitis of right hip Yes  . Diabetes mellitus type 2 in obese [facility])     PLAN Call if any problems.  Precautions discussed.  Continue current medications.   Return to clinic 2 months

## 2015-06-20 ENCOUNTER — Telehealth: Payer: Self-pay | Admitting: Family Medicine

## 2015-06-20 DIAGNOSIS — J069 Acute upper respiratory infection, unspecified: Secondary | ICD-10-CM | POA: Diagnosis not present

## 2015-06-20 DIAGNOSIS — J029 Acute pharyngitis, unspecified: Secondary | ICD-10-CM | POA: Diagnosis not present

## 2015-06-20 DIAGNOSIS — J329 Chronic sinusitis, unspecified: Secondary | ICD-10-CM | POA: Diagnosis not present

## 2015-06-20 NOTE — Telephone Encounter (Signed)
Patient aware that she should seek care at urgent care.  She also states that she has a bug bite on the back of her neck that is draining fluid.

## 2015-06-20 NOTE — Telephone Encounter (Signed)
Patient is stating she thinks she has strep throat again, her neck and throat hurts she states she cant turn her head at all, please advise?

## 2015-06-21 ENCOUNTER — Other Ambulatory Visit: Payer: Self-pay | Admitting: Family Medicine

## 2015-07-05 ENCOUNTER — Telehealth: Payer: Self-pay | Admitting: Orthopaedic Surgery

## 2015-07-05 MED ORDER — HYDROCODONE-ACETAMINOPHEN 5-325 MG PO TABS: 1.0000 | ORAL_TABLET | ORAL | Status: DC | PRN

## 2015-07-05 NOTE — Telephone Encounter (Signed)
Patient called and requested a refill on Hydrocodone-Acetaminophen(Norco) 5-325 mgs.  Qty 120  Sig: Take 1 tablet by mouth every 4 (four) hours as needed for moderate pain (Must last 30 days.  Do not take and drive a car or use machinery.). °

## 2015-07-05 NOTE — Telephone Encounter (Signed)
Rx Done . 

## 2015-07-06 ENCOUNTER — Other Ambulatory Visit: Payer: Self-pay | Admitting: Family Medicine

## 2015-07-11 ENCOUNTER — Other Ambulatory Visit: Payer: Self-pay | Admitting: Family Medicine

## 2015-07-25 ENCOUNTER — Telehealth: Payer: Self-pay | Admitting: Orthopaedic Surgery

## 2015-07-25 NOTE — Telephone Encounter (Signed)
Hydrocodone is the same medicine even in less strength.  I can change to Ultram if she wants.

## 2015-07-25 NOTE — Telephone Encounter (Signed)
[name] says that she is itching from the pain medication (Hydrocodone-Acetaminophen 5/325mg ).  She stated that you gave her some 500 mg medication before and it didn't make her itch. She says that the drug store gave her some" speckled pills for the 5/325 medication and anyway it makes her itch". She has been taking Benadryl because of it. I did tell her not to take anymore of the pain medication.  Please advise.

## 2015-07-28 ENCOUNTER — Other Ambulatory Visit: Payer: Self-pay | Admitting: Family Medicine

## 2015-08-01 NOTE — Telephone Encounter (Signed)
Spoke with [name] and she said she would like to try the Ultram. She stated that she didn't understand how the 7.5's didn't bother her, but the 5/325's did and she just couldn't take them.

## 2015-08-02 MED ORDER — TRAMADOL HCL 50 MG PO TABS: 50.0000 mg | ORAL_TABLET | Freq: Four times a day (QID) | ORAL | Status: DC | PRN

## 2015-08-02 NOTE — Telephone Encounter (Signed)
Rx done. 

## 2015-08-10 ENCOUNTER — Ambulatory Visit (INDEPENDENT_AMBULATORY_CARE_PROVIDER_SITE_OTHER): Payer: Medicare Other | Admitting: Orthopaedic Surgery

## 2015-08-10 ENCOUNTER — Encounter: Payer: Self-pay | Admitting: Orthopaedic Surgery

## 2015-08-10 VITALS — BP 137/72 | HR 101 | Temp 96.8°F | Ht 63.0 in | Wt 163.6 lb

## 2015-08-10 DIAGNOSIS — E669 Obesity, unspecified: Secondary | ICD-10-CM

## 2015-08-10 DIAGNOSIS — E119 Type 2 diabetes mellitus without complications: Secondary | ICD-10-CM | POA: Diagnosis not present

## 2015-08-10 DIAGNOSIS — M7061 Trochanteric bursitis, right hip: Secondary | ICD-10-CM | POA: Diagnosis not present

## 2015-08-10 DIAGNOSIS — Z720 Tobacco use: Secondary | ICD-10-CM

## 2015-08-10 DIAGNOSIS — F17210 Nicotine dependence, cigarettes, uncomplicated: Secondary | ICD-10-CM

## 2015-08-10 DIAGNOSIS — E1169 Type 2 diabetes mellitus with other specified complication: Secondary | ICD-10-CM

## 2015-08-10 NOTE — Progress Notes (Signed)
Patient [name], female DOB:08/21/1945, 70 y.o. [id number]  Chief Complaint  Patient presents with  . Follow-up    Right hip    HPI  [name] is a 70 y.o. female who has right hip pain and right trochanteric hip pain.  She is much improved.  She has little pain today.  She is walking well.  She has no redness, no swelling of the right hip area. HPI  Body mass index is 28.98 kg/m.  ROS  Review of Systems  HENT: Negative for congestion.   Respiratory: Positive for shortness of breath. Negative for cough.   Cardiovascular: Negative for chest pain and leg swelling.  Endocrine: Positive for cold intolerance.  Musculoskeletal: Positive for arthralgias, gait problem, joint swelling and myalgias.  Allergic/Immunologic: Positive for environmental allergies.    Past Medical History:  Diagnosis Date  . Breast cancer ([place]) 03/11/2012   Stage I (T1b N0 M0), grade 1 well-differentiated carcinoma of the left breast status, post lumpectomy followed by radiation therapy. Her estrogen receptor receptors were 93%, progesterone receptors 67%. HER-2/neu was negative. No lymphovascular space invasion was seen. All margins were clear. Ki-67 marker was low at 1% with surgery on 11/15/2004. Treated then with post-lumpectomy radiation, finish  . Breast cancer, left breast ([place])   . COPD (chronic obstructive pulmonary disease) ([place])   . Diabetes mellitus without complication ([place])   . Diverticula of colon   . Hyperlipidemia   . Hypertension   . Kidney stones   . Nicotine dependence   . Osteoarthritis     Past Surgical History:  Procedure Laterality Date  . ABDOMINAL HYSTERECTOMY    . BREAST SURGERY  2005 approx   left lumpectomy   . CHOLECYSTECTOMY N/A 05/26/2014   Procedure: LAPAROSCOPIC CHOLECYSTECTOMY;  Surgeon: Aviva Signs Md, MD;  Location: [facility];  Service: General;  Laterality: N/A;  . COLECTOMY     2005, diverticulitis  . DILATION AND CURETTAGE OF UTERUS    . left  breast      cancer, in 2006  . TUBAL LIGATION      Family History  Problem Relation Age of Onset  . Cancer Mother     breast  . COPD Father   . Cancer Sister     cancer    Social History Social History  Substance Use Topics  . Smoking status: Current Every Day Smoker    Packs/day: 0.50    Years: 50.00    Types: Cigarettes  . Smokeless tobacco: Never Used     Comment: cigaretes  now 10 to 15 per day  . Alcohol use No    Allergies  Allergen Reactions  . Penicillins Hives, Shortness Of Breath and Swelling  . Sulfonamide Derivatives Other (See Comments)    Shaking all over, seizure like symptoms. Hospitalization resulted     Current Outpatient Prescriptions  Medication Sig Dispense Refill  . acetaminophen (TYLENOL) 500 MG tablet Take 500-1,000 mg by mouth every 6 (six) hours as needed for moderate pain.     Marland Kitchen albuterol (PROVENTIL) (2.5 MG/3ML) 0.083% nebulizer solution USE 1 VIAL IN NEBULIZER 3 TIMES DAILY. 300 mL 0  . amLODipine (NORVASC) 5 MG tablet TAKE 1 TABLET BY MOUTH ONCE A DAY. 30 tablet 3  . aspirin EC 81 MG tablet Take 81 mg by mouth daily.    . ATROVENT HFA 17 MCG/ACT inhaler INHALE 2 PUFFS BY MOUTH FOUR TIMES A DAY. 12.9 g 2  . azelastine (ASTELIN) 0.1 % nasal spray Place  2 sprays into both nostrils 2 (two) times daily. Use in each nostril as directed 30 mL 12  . benazepril (LOTENSIN) 40 MG tablet TAKE ONE TABLET BY MOUTH DAILY. 90 tablet 1  . Calcium Carbonate-Vitamin D (CALCIUM 600 + D PO) Take 1 tablet by mouth 2 (two) times daily.     . fluticasone (FLONASE) 50 MCG/ACT nasal spray Place 2 sprays into both nostrils 2 (two) times daily. 16 g 5  . loratadine (CLARITIN) 10 MG tablet TAKE 1 TABLET BY MOUTH ONCE DAILY FOR ALLERGIES. 30 tablet 6  . montelukast (SINGULAIR) 10 MG tablet TAKE 1 TABLET BY MOUTH AT BEDTIME. 30 tablet 3  . Omega-3 Fatty Acids (FISH OIL) 1000 MG CAPS Take 2 capsules by mouth daily.    . polyethylene glycol powder (GLYCOLAX/MIRALAX) powder  MIX 1 CAPFUL IN 8 OUNCES OF JUICE OR WATER AND DRINK ONCE DAILY. 527 g 3  . potassium chloride SA (K-DUR,KLOR-CON) 20 MEQ tablet TAKE 1 TABLET BY MOUTH DAILY. 30 tablet 3  . simvastatin (ZOCOR) 20 MG tablet TAKE (1) TABLET BY MOUTH DAILY AT 6 PM FOR CHOLESTEROL. 30 tablet 2  . SYMBICORT 160-4.5 MCG/ACT inhaler INHALE 2 PUFFS INTO THE LUNGS TWICE DAILY. 10.2 g 2  . tiZANidine (ZANAFLEX) 4 MG tablet     . traMADol (ULTRAM) 50 MG tablet Take 1 tablet (50 mg total) by mouth every 6 (six) hours as needed. 60 tablet 3   Current Facility-Administered Medications  Medication Dose Route Frequency Provider Last Rate Last Dose  . Influenza (>/= 3 years) inactive virus vaccine (FLVIRIN/FLUZONE) injection SUSP 0.5 mL  0.5 mL Intramuscular Once [name], MD         Physical Exam  Blood pressure 137/72, pulse (!) 101, temperature (!) 96.8 F (36 C), height '5\' 3"'  (1.6 m), weight 163 lb 9.6 oz (74.2 kg).  Constitutional: overall normal hygiene, normal nutrition, well developed, normal grooming, normal body habitus. Assistive device:none  Musculoskeletal: gait and station Limp none, muscle tone and strength are normal, no tremors or atrophy is present.  .  Neurological: coordination overall normal.  Deep tendon reflex/nerve stretch intact.  Sensation normal.  Cranial nerves II-XII intact.   Skin:   normal overall no scars, lesions, ulcers or rashes. No psoriasis.  Psychiatric: Alert and oriented x 3.  Recent memory intact, remote memory unclear.  Normal mood and affect. Well groomed.  Good eye contact.  Cardiovascular: overall no swelling, no varicosities, no edema bilaterally, normal temperatures of the legs and arms, no clubbing, cyanosis and good capillary refill.  Lymphatic: palpation is normal.  She has no pain of the right hip area today.  She has no redness or swelling.  Gait is normal. ROM of both hips are normal.  Strength and tone are normal.   The patient has been educated  about the nature of the problem(s) and counseled on treatment options.  The patient appeared to understand what I have discussed and is in agreement with it.  Encounter Diagnoses  Name Primary?  . Trochanteric bursitis of right hip Yes  . Diabetes mellitus type 2 in obese ([place])   . Tobacco smoker, less than 10 cigarettes per day     PLAN Call if any problems.  Precautions discussed.  Continue current medications.   Return to clinic 3 months   Electronically Signed Sanjuana Kava, MD 7/26/20172:04 PM

## 2015-08-16 DIAGNOSIS — M19071 Primary osteoarthritis, right ankle and foot: Secondary | ICD-10-CM | POA: Diagnosis not present

## 2015-08-25 ENCOUNTER — Other Ambulatory Visit: Payer: Self-pay | Admitting: Family Medicine

## 2015-08-25 DIAGNOSIS — E559 Vitamin D deficiency, unspecified: Secondary | ICD-10-CM | POA: Diagnosis not present

## 2015-08-25 DIAGNOSIS — E119 Type 2 diabetes mellitus without complications: Secondary | ICD-10-CM | POA: Diagnosis not present

## 2015-08-25 DIAGNOSIS — I10 Essential (primary) hypertension: Secondary | ICD-10-CM | POA: Diagnosis not present

## 2015-08-25 LAB — CBC
HCT: 41.6 % (ref 35.0–45.0)
Hemoglobin: 13.5 g/dL (ref 11.7–15.5)
MCH: 29.5 pg (ref 27.0–33.0)
MCHC: 32.5 g/dL (ref 32.0–36.0)
MCV: 90.8 fL (ref 80.0–100.0)
MPV: 10.7 fL (ref 7.5–12.5)
PLATELETS: 431 10*3/uL — AB (ref 140–400)
RBC: 4.58 MIL/uL (ref 3.80–5.10)
RDW: 13.4 % (ref 11.0–15.0)
WBC: 9.3 10*3/uL (ref 3.8–10.8)

## 2015-08-26 LAB — BASIC METABOLIC PANEL WITH GFR
BUN: 18 mg/dL (ref 7–25)
CHLORIDE: 105 mmol/L (ref 98–110)
CO2: 21 mmol/L (ref 20–31)
Calcium: 9.2 mg/dL (ref 8.6–10.4)
Creat: 0.81 mg/dL (ref 0.60–0.93)
GFR, EST AFRICAN AMERICAN: 85 mL/min (ref >=60)
GFR, Est Non African American: 74 mL/min (ref >=60)
GLUCOSE: 159 mg/dL — AB (ref 65–99)
Potassium: 4.6 mmol/L (ref 3.5–5.3)
Sodium: 138 mmol/L (ref 135–146)

## 2015-08-26 LAB — VITAMIN D 25 HYDROXY (VIT D DEFICIENCY, FRACTURES): VIT D 25 HYDROXY: 36 ng/mL (ref 30–100)

## 2015-08-26 LAB — HEMOGLOBIN A1C
HEMOGLOBIN A1C: 5.6 % (ref <5.7)
Mean Plasma Glucose: 114 mg/dL

## 2015-08-31 ENCOUNTER — Encounter: Payer: Self-pay | Admitting: Family Medicine

## 2015-08-31 ENCOUNTER — Ambulatory Visit (INDEPENDENT_AMBULATORY_CARE_PROVIDER_SITE_OTHER): Payer: Medicare Other | Admitting: Family Medicine

## 2015-08-31 VITALS — BP 122/74 | HR 91 | Resp 16 | Ht 63.0 in | Wt 163.0 lb

## 2015-08-31 DIAGNOSIS — E119 Type 2 diabetes mellitus without complications: Secondary | ICD-10-CM | POA: Diagnosis not present

## 2015-08-31 DIAGNOSIS — E785 Hyperlipidemia, unspecified: Secondary | ICD-10-CM | POA: Diagnosis not present

## 2015-08-31 DIAGNOSIS — F17200 Nicotine dependence, unspecified, uncomplicated: Secondary | ICD-10-CM | POA: Diagnosis not present

## 2015-08-31 DIAGNOSIS — I10 Essential (primary) hypertension: Secondary | ICD-10-CM | POA: Diagnosis not present

## 2015-08-31 MED ORDER — BENZONATATE 100 MG PO CAPS
100.0000 mg | ORAL_CAPSULE | Freq: Two times a day (BID) | ORAL | 0 refills | Status: DC | PRN
Start: 2015-08-31 — End: 2015-11-22

## 2015-08-31 MED ORDER — PREDNISONE 5 MG PO TABS: 5.0000 mg | ORAL_TABLET | Freq: Two times a day (BID) | ORAL | 0 refills | Status: AC

## 2015-08-31 NOTE — Patient Instructions (Addendum)
Annual physican exam in January, call if you need me before   Come for flu vaccine in September  Please work on [employer] cigarettes  Prednisone and tessalon perles sent for cough      You Can Quit Smoking If you are ready to quit smoking or are thinking about it, congratulations! You have chosen to help yourself be healthier and live longer! There are lots of different ways to quit smoking. Nicotine gum, nicotine patches, a nicotine inhaler, or nicotine nasal spray can help with physical craving. Hypnosis, support groups, and medicines help break the habit of smoking. TIPS TO GET OFF AND STAY OFF CIGARETTES  Learn to predict your moods. Do not let a bad situation be your excuse to have a cigarette. Some situations in your life might tempt you to have a cigarette.  Ask friends and co-workers not to smoke around you.  Make your home smoke-free.  Never have "just one" cigarette. It leads to wanting another and another. Remind yourself of your decision to quit.  On a card, make a list of your reasons for not smoking. Read it at least the same number of times a day as you have a cigarette. Tell yourself everyday, "I do not want to smoke. I choose not to smoke."  Ask someone at home or work to help you with your plan to quit smoking.  Have something planned after you eat or have a cup of coffee. Take a walk or get other exercise to perk you up. This will help to keep you from overeating.  Try a relaxation exercise to calm you down and decrease your stress. Remember, you may be tense and nervous the first two weeks after you quit. This will pass.  Find new activities to keep your hands busy. Play with a pen, coin, or rubber band. Doodle or draw things on paper.  Brush your teeth right after eating. This will help cut down the craving for the taste of tobacco after meals. You can try mouthwash too.  Try gum, breath mints, or diet candy to keep something in your mouth. IF YOU SMOKE AND  WANT TO QUIT:  Do not stock up on cigarettes. Never buy a carton. Wait until one pack is finished before you buy another.  Never carry cigarettes with you at work or at home.  Keep cigarettes as far away from you as possible. Leave them with someone else.  Never carry matches or a lighter with you.  Ask yourself, "Do I need this cigarette or is this just a reflex?"  Bet with someone that you can quit. Put cigarette money in a piggy bank every morning. If you smoke, you give up the money. If you do not smoke, by the end of the week, you keep the money.  Keep trying. It takes 21 days to change a habit!  Talk to your doctor about using medicines to help you quit. These include nicotine replacement gum, lozenges, or skin patches.   This information is not intended to replace advice given to you by your health care provider. Make sure you discuss any questions you have with your health care provider.   Document Released: 10/28/2008 Document Revised: 03/26/2011 Document Reviewed: 10/28/2008 Elsevier Interactive Patient Education [employer].

## 2015-09-03 NOTE — Assessment & Plan Note (Signed)
Controlled, no change in medication DASH diet and commitment to daily physical activity for a minimum of 30 minutes discussed and encouraged, as a part of hypertension management. The importance of attaining a healthy weight is also discussed.  BP/Weight 08/31/2015 08/10/2015 06/08/2015 05/11/2015 04/28/2015 04/06/2015 123XX123  Systolic BP 123XX123 0000000 Q000111Q Q000111Q [id number] 0000000 [id number]  Diastolic BP 74 72 80 66 80 75 78  Wt. (Lbs) 163 163.6 165 164 164 166.5 163.12  BMI 28.87 28.98 28.31 28.14 28.14 28.57 27.99

## 2015-09-03 NOTE — Progress Notes (Signed)
[name]     MRN: [id number]      DOB: [birth year]   HPI [name] is here for follow up and re-evaluation of chronic medical conditions, medication management and review of any available recent lab and radiology data.  Preventive health is updated, specifically  Cancer screening and Immunization.   Questions or concerns regarding consultations or procedures which the PT has had in the interim are  addressed. The PT denies any adverse reactions to current medications since the last visit.  There are no new concerns.  There are no specific complaints   ROS Denies recent fever or chills. Denies sinus pressure, nasal congestion, ear pain or sore throat. Denies chest congestion, productive cough or wheezing. Denies chest pains, palpitations and leg swelling Denies abdominal pain, nausea, vomiting,diarrhea or constipation.   Denies dysuria, frequency, hesitancy or incontinence. Denies joint pain, swelling and limitation in mobility. Denies headaches, seizures, numbness, or tingling. Denies depression, anxiety or insomnia. Denies skin break down or rash.   PE  BP 122/74   Pulse 91   Resp 16   Ht 5\' 3"  (1.6 m)   Wt 163 lb (73.9 kg)   SpO2 95%   BMI 28.87 kg/m   Patient alert and oriented and in no cardiopulmonary distress.  HEENT: No facial asymmetry, EOMI,   oropharynx pink and moist.  Neck supple no JVD, no mass.  Chest: Clear to auscultation bilaterally.  CVS: S1, S2 no murmurs, no S3.Regular rate.  ABD: Soft non tender.   Ext: No edema  MS: Adequate ROM spine, shoulders, hips and knees.  Skin: Intact, no ulcerations or rash noted.  Psych: Good eye contact, normal affect. Memory intact not anxious or depressed appearing.  CNS: CN 2-12 intact, power,  normal throughout.no focal deficits noted.   Assessment & Plan  Hypertension goal BP (blood pressure) < 130/80 Controlled, no change in medication DASH diet and commitment to daily physical activity for a  minimum of 30 minutes discussed and encouraged, as a part of hypertension management. The importance of attaining a healthy weight is also discussed.  BP/Weight 08/31/2015 08/10/2015 06/08/2015 05/11/2015 04/28/2015 04/06/2015 123XX123  Systolic BP 123XX123 0000000 Q000111Q Q000111Q [id number] 0000000 [id number]  Diastolic BP 74 72 80 66 80 75 78  Wt. (Lbs) 163 163.6 165 164 164 166.5 163.12  BMI 28.87 28.98 28.31 28.14 28.14 28.57 27.99       Diabetes mellitus type 2, diet-controlled (HCC) Controlled, no change in management [name] is reminded of the importance of commitment to daily physical activity for 30 minutes or more, as able and the need to limit carbohydrate intake to 30 to 60 grams per meal to help with blood sugar control.   .   [name] is reminded of the importance of daily foot exam, annual eye examination, and good blood sugar, blood pressure and cholesterol control.  Diabetic Labs Latest Ref Rng & Units 08/25/2015 04/25/2015 01/04/2015 08/27/2014 05/21/2014  HbA1c <5.7 % 5.6 5.9(H) 5.9(H) 6.0(H) -  Microalbumin <2.0 mg/dL - - - - -  Micro/Creat Ratio 0.0 - 30.0 mg/g - - - - -  Chol 125 - 200 mg/dL - 175 - 198 -  HDL >=46 mg/dL - 68 - 69 -  Calc LDL <130 mg/dL - 80 - 90 -  Triglycerides <150 mg/dL - 134 - 193(H) -  Creatinine 0.60 - 0.93 mg/dL 0.81 0.71 0.87 0.73 0.76   BP/Weight 08/31/2015 08/10/2015 06/08/2015 05/11/2015 04/28/2015 04/06/2015 123XX123  Systolic BP 123XX123  137 161 145 [id number] 0000000 [id number]  Diastolic BP 74 72 80 66 80 75 78  Wt. (Lbs) 163 163.6 165 164 164 166.5 163.12  BMI 28.87 28.98 28.31 28.14 28.14 28.57 27.99   Foot/eye exam completion dates Latest Ref Rng & Units 09/07/2014 06/10/2013  Eye Exam No Retinopathy No Retinopathy -  Foot Form Completion - - Done      '  NICOTINE ADDICTION Patient counseled for approximately 5 minutes regarding the health risks of ongoing nicotine use, specifically all types of cancer, heart disease, stroke and respiratory failure. The options available for help with  cessation ,the behavioral changes to assist the process, and the option to either gradully reduce usage  Or abruptly stop.is also discussed. Pt is also encouraged to set specific goals in number of cigarettes used daily, as well as to set a quit date.     Hyperlipidemia LDL goal <100 Hyperlipidemia:Low fat diet discussed and encouraged.   Lipid Panel  Lab Results  Component Value Date   CHOL 175 04/25/2015   HDL 68 04/25/2015   LDLCALC 80 04/25/2015   TRIG 134 04/25/2015   CHOLHDL 2.6 04/25/2015   Updated lab needed at/ before next visit. Controlled, no change in medication     COPD (chronic obstructive pulmonary disease) with chronic bronchitis (HCC) Worsening due to ongoing nicotine use, re educated re need to d/c smoking No recent flare,

## 2015-09-03 NOTE — Assessment & Plan Note (Signed)
Worsening due to ongoing nicotine use, re educated re need to d/c smoking No recent flare,

## 2015-09-03 NOTE — Assessment & Plan Note (Addendum)
Controlled, no change in management Ms. Casebeer is reminded of the importance of commitment to daily physical activity for 30 minutes or more, as able and the need to limit carbohydrate intake to 30 to 60 grams per meal to help with blood sugar control.   .   Ms. Aten is reminded of the importance of daily foot exam, annual eye examination, and good blood sugar, blood pressure and cholesterol control.  Diabetic Labs Latest Ref Rng & Units 08/25/2015 04/25/2015 01/04/2015 08/27/2014 05/21/2014  HbA1c <5.7 % 5.6 5.9(H) 5.9(H) 6.0(H) -  Microalbumin <2.0 mg/dL - - - - -  Micro/Creat Ratio 0.0 - 30.0 mg/g - - - - -  Chol 125 - 200 mg/dL - 175 - 198 -  HDL >=46 mg/dL - 68 - 69 -  Calc LDL <130 mg/dL - 80 - 90 -  Triglycerides <150 mg/dL - 134 - 193(H) -  Creatinine 0.60 - 0.93 mg/dL 0.81 0.71 0.87 0.73 0.76   BP/Weight 08/31/2015 08/10/2015 06/08/2015 05/11/2015 04/28/2015 04/06/2015 123XX123  Systolic BP 123XX123 0000000 Q000111Q Q000111Q [id number] 0000000 [id number]  Diastolic BP 74 72 80 66 80 75 78  Wt. (Lbs) 163 163.6 165 164 164 166.5 163.12  BMI 28.87 28.98 28.31 28.14 28.14 28.57 27.99   Foot/eye exam completion dates Latest Ref Rng & Units 09/07/2014 06/10/2013  Eye Exam No Retinopathy No Retinopathy -  Foot Form Completion - - Done      '

## 2015-09-03 NOTE — Assessment & Plan Note (Signed)
Hyperlipidemia:Low fat diet discussed and encouraged.   Lipid Panel  Lab Results  Component Value Date   CHOL 175 04/25/2015   HDL 68 04/25/2015   LDLCALC 80 04/25/2015   TRIG 134 04/25/2015   CHOLHDL 2.6 04/25/2015   Updated lab needed at/ before next visit. Controlled, no change in medication

## 2015-09-03 NOTE — Assessment & Plan Note (Signed)

## 2015-09-29 ENCOUNTER — Telehealth: Payer: Self-pay | Admitting: Orthopaedic Surgery

## 2015-09-29 ENCOUNTER — Other Ambulatory Visit: Payer: Self-pay | Admitting: Family Medicine

## 2015-10-04 ENCOUNTER — Ambulatory Visit (INDEPENDENT_AMBULATORY_CARE_PROVIDER_SITE_OTHER): Payer: Medicare Other

## 2015-10-04 DIAGNOSIS — Z23 Encounter for immunization: Secondary | ICD-10-CM | POA: Diagnosis not present

## 2015-10-13 ENCOUNTER — Ambulatory Visit (INDEPENDENT_AMBULATORY_CARE_PROVIDER_SITE_OTHER): Payer: Medicare Other | Admitting: Family Medicine

## 2015-10-13 ENCOUNTER — Other Ambulatory Visit (HOSPITAL_COMMUNITY)
Admission: AD | Admit: 2015-10-13 | Discharge: 2015-10-13 | Disposition: A | Discharge status: Home / Self Care | Payer: Medicare Other | Source: Skilled Nursing Facility | Attending: Family Medicine | Admitting: Family Medicine

## 2015-10-13 ENCOUNTER — Encounter: Payer: Self-pay | Admitting: Family Medicine

## 2015-10-13 VITALS — BP 140/72 | HR 87 | Temp 98.9°F | Ht 63.0 in | Wt 162.0 lb

## 2015-10-13 DIAGNOSIS — J441 Chronic obstructive pulmonary disease with (acute) exacerbation: Secondary | ICD-10-CM | POA: Diagnosis not present

## 2015-10-13 DIAGNOSIS — R109 Unspecified abdominal pain: Secondary | ICD-10-CM

## 2015-10-13 DIAGNOSIS — N3090 Cystitis, unspecified without hematuria: Secondary | ICD-10-CM

## 2015-10-13 LAB — POCT URINALYSIS DIPSTICK
BILIRUBIN UA: NEGATIVE
Blood, UA: NEGATIVE
Glucose, UA: NEGATIVE
Ketones, UA: NEGATIVE
NITRITE UA: NEGATIVE
PH UA: 5.5
Protein, UA: 30
Spec Grav, UA: 1.02
Urobilinogen, UA: 0.2

## 2015-10-13 MED ORDER — LEVOFLOXACIN 500 MG PO TABS: 500.0000 mg | ORAL_TABLET | Freq: Every day | ORAL | 0 refills | Status: DC

## 2015-10-13 NOTE — Addendum Note (Signed)
Addended by: Eual Fines on: 10/13/2015 01:31 PM   Modules accepted: Orders

## 2015-10-13 NOTE — Patient Instructions (Signed)
Drink plenty of water Take the levaquin once a day for 10 days Call if not improving by [facility]

## 2015-10-13 NOTE — Progress Notes (Signed)
Chief Complaint  Patient presents with  . Abdominal Cramping    lower cramping since this weekend. States it feels like a uti   . Cough    sinus drainage and producing yellowish mucus x a few days and some left ear pain   Patient is here for an acute visit usual primary care doctor is [name]. She is here today for increased sinus drainage, yellow postnasal drip, increased coughing and shortness of breath. She is a smoker with ongoing COPD and frequent exacerbations. She also has allergic rhinitis. She is compliant with her inhalers. She has used her inhaler more frequently because of shortness of breath. No chest pain. No fever or chills. No nausea or vomiting. She also is complaining of lower abdominal pain. She has some cramping in her suprapubic region. She feels she is urinating a little more frequently. No dysuria. No flank pain. No history of kidney problems. She is worried about a bladder infection. She also states has a history of partial colectomy for diverticulitis/diverticulosis and worries about a flare. No diarrhea. No blood or mucus in the stool.   Patient Active Problem List   Diagnosis Date Noted  . Allergic rhinitis 01/04/2015  . 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[place]) 09/26/2014  . Hydrosalpinx 09/28/2013  . 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 Osteopenia 07/15/2013  . postmenopausal ovarian cyst 04/06/2013  . Pulmonary nodules 04/04/2012  . Breast cancer ([place]) 03/11/2012  . Diabetes mellitus type 2, diet-controlled ([place]) 09/13/2011  . Ganglion cyst 05/08/2011  . COPD (chronic obstructive pulmonary disease) with chronic bronchitis ([place]) 07/24/2009  . Hyperlipidemia LDL goal <100 03/09/2009  . CONSTIPATION, CHRONIC 03/09/2009  . NICOTINE ADDICTION 10/10/2008  . Hypertension goal BP (blood pressure) < 130/80 09/29/2008  . GENERALIZED OSTEOARTHROSIS UNSPECIFIED SITE 09/29/2008    Outpatient Encounter Prescriptions as of 10/13/2015  Medication Sig  .  acetaminophen (TYLENOL) 500 MG tablet Take 500-1,000 mg by mouth every 6 (six) hours as needed for moderate pain.   Marland Kitchen albuterol (PROVENTIL) (2.5 MG/3ML) 0.083% nebulizer solution USE 1 VIAL IN NEBULIZER 3 TIMES DAILY.  Marland Kitchen amLODipine (NORVASC) 5 MG tablet TAKE 1 TABLET BY MOUTH ONCE A DAY.  Marland Kitchen aspirin EC 81 MG tablet Take 81 mg by mouth daily.  . ATROVENT HFA 17 MCG/ACT inhaler INHALE 2 PUFFS BY MOUTH FOUR TIMES A DAY.  . benazepril (LOTENSIN) 40 MG tablet TAKE ONE TABLET BY MOUTH DAILY.  Marland Kitchen loratadine (CLARITIN) 10 MG tablet TAKE 1 TABLET BY MOUTH ONCE DAILY FOR ALLERGIES.  Marland Kitchen montelukast (SINGULAIR) 10 MG tablet TAKE 1 TABLET BY MOUTH AT BEDTIME.  Marland Kitchen Omega-3 Fatty Acids (FISH OIL) 1000 MG CAPS Take 2 capsules by mouth daily.  . polyethylene glycol powder (GLYCOLAX/MIRALAX) powder MIX 1 CAPFUL IN 8 OUNCES OF JUICE OR WATER AND DRINK ONCE DAILY.  . simvastatin (ZOCOR) 20 MG tablet TAKE (1) TABLET BY MOUTH DAILY AT 6 PM FOR CHOLESTEROL.  Marland Kitchen tiZANidine (ZANAFLEX) 4 MG tablet TAKE ONE TABLET EVERY 8 HOURS AS NEEDED FOR MUSCLE SPASMS.  Marland Kitchen traMADol (ULTRAM) 50 MG tablet Take 1 tablet (50 mg total) by mouth every 6 (six) hours as needed.  Marland Kitchen azelastine (ASTELIN) 0.1 % nasal spray Place 2 sprays into both nostrils 2 (two) times daily. Use in each nostril as directed  . benzonatate (TESSALON) 100 MG capsule Take 1 capsule (100 mg total) by mouth 2 (two) times daily as needed for cough.  . Calcium Carbonate-Vitamin D (CALCIUM 600 + D PO) Take 1  tablet by mouth 2 (two) times daily.   Marland Kitchen levofloxacin (LEVAQUIN) 500 MG tablet Take 1 tablet (500 mg total) by mouth daily.  . potassium chloride SA (K-DUR,KLOR-CON) 20 MEQ tablet TAKE 1 TABLET BY MOUTH DAILY.  . SYMBICORT 160-4.5 MCG/ACT inhaler INHALE 2 PUFFS INTO THE LUNGS TWICE DAILY.   Facility-Administered Encounter Medications as of 10/13/2015  Medication  . Influenza (>/= 3 years) inactive virus vaccine (FLVIRIN/FLUZONE) injection SUSP 0.5 mL    Allergies    Allergen Reactions  . Penicillins Hives, Shortness Of Breath and Swelling  . Sulfonamide Derivatives Other (See Comments)    Shaking all over, seizure like symptoms. Hospitalization resulted     Review of Systems  Constitutional: Positive for appetite change. Negative for chills, fatigue and fever.  HENT: Positive for congestion, postnasal drip, rhinorrhea and sinus pressure.   Eyes: Negative for redness and visual disturbance.  Respiratory: Positive for cough, chest tightness and wheezing.   Cardiovascular: Negative for chest pain and leg swelling.  Gastrointestinal: Positive for abdominal pain. Negative for blood in stool, constipation, diarrhea, nausea and vomiting.  Genitourinary: Positive for frequency. Negative for dysuria and flank pain.  Musculoskeletal: Negative for back pain and gait problem.  Skin: Negative for color change and rash.  Neurological: Negative for dizziness and headaches.  Psychiatric/Behavioral: Negative for sleep disturbance. The patient is not nervous/anxious.     BP 140/72   Pulse 87   Temp 98.9 F (37.2 C) (Oral)   Ht 5\' 3"  (1.6 m)   Wt 162 lb (73.5 kg)   SpO2 94%   BMI 28.70 kg/m   Physical Exam  Constitutional: She is oriented to person, place, and time. She appears well-developed and well-nourished. No distress.  HENT:  Head: Normocephalic and atraumatic.  Right Ear: External ear normal.  Left Ear: External ear normal.  Mouth/Throat: Oropharynx is clear and moist. No oropharyngeal exudate.  Thick white PND. Mild tenderness maxillary sinuses  Eyes: Conjunctivae are normal. Pupils are equal, round, and reactive to light.  Neck: Normal range of motion. Neck supple. No thyromegaly present.  Cardiovascular: Normal rate, regular rhythm and normal heart sounds.   Pulmonary/Chest: Effort normal and breath sounds normal. No respiratory distress. She has no wheezes. She has no rales.  Mr. breath sounds. No rales or wheeze  Abdominal: Soft. Bowel  sounds are normal. She exhibits no distension and no mass. There is tenderness. There is no rebound and no guarding.  Mild to moderate suprapubic tenderness to palpation. No guarding or rebound. No palpable mass. No hepatosplenomegaly  Musculoskeletal: Normal range of motion. She exhibits no edema.  Lymphadenopathy:    She has no cervical adenopathy.  Neurological: She is alert and oriented to person, place, and time.  Psychiatric: She has a normal mood and affect. Her behavior is normal. Thought content normal.   Dip UA does show positive leukocytes. Culture will be sent  ASSESSMENT/PLAN:  1. Cystitis Push fluids  2. COPD exacerbation (HCC) Ingenious current inhalers  3. Abdominal pain, unspecified abdominal location  Levaquin was chosen as an antibiotic. This would cover diverticular disease and URI. Doubt cystitis but am getting a urine culture. She is instructed to call or return if worse at any time, and to watch for fever chills nausea vomiting and diarrhea.  Patient Instructions  Drink plenty of water Take the levaquin once a day for 10 days Call if not improving by monday   Raylene Everts, MD

## 2015-10-15 LAB — URINE CULTURE

## 2015-10-28 ENCOUNTER — Other Ambulatory Visit: Payer: Self-pay | Admitting: Family Medicine

## 2015-11-07 ENCOUNTER — Other Ambulatory Visit: Payer: Self-pay | Admitting: Family Medicine

## 2015-11-10 ENCOUNTER — Ambulatory Visit (INDEPENDENT_AMBULATORY_CARE_PROVIDER_SITE_OTHER): Payer: Medicare Other | Admitting: Orthopaedic Surgery

## 2015-11-10 ENCOUNTER — Encounter: Payer: Self-pay | Admitting: Orthopaedic Surgery

## 2015-11-10 VITALS — BP 134/84 | HR 95 | Resp 18 | Ht 63.0 in | Wt 157.0 lb

## 2015-11-10 DIAGNOSIS — F17210 Nicotine dependence, cigarettes, uncomplicated: Secondary | ICD-10-CM | POA: Diagnosis not present

## 2015-11-10 DIAGNOSIS — E669 Obesity, unspecified: Secondary | ICD-10-CM

## 2015-11-10 DIAGNOSIS — M7061 Trochanteric bursitis, right hip: Secondary | ICD-10-CM

## 2015-11-10 DIAGNOSIS — E1169 Type 2 diabetes mellitus with other specified complication: Secondary | ICD-10-CM | POA: Diagnosis not present

## 2015-11-10 NOTE — Progress Notes (Signed)
Patient [name], female DOB:03/17/1945, 70 y.o. XBD:[id number]  Chief Complaint  Patient presents with  . Follow-up    3 month recheck on right hip pain.    HPI  [name] is a 70 y.o. female who has chronic pain of the right hip trochanteric bursa area.  She has no new trauma.  She has no redness or paresthesias.  She is taking her medicine and being active. HPI  Body mass index is 27.81 kg/m.  ROS  Review of Systems  HENT: Negative for congestion.   Respiratory: Positive for shortness of breath. Negative for cough.   Cardiovascular: Negative for chest pain and leg swelling.  Endocrine: Positive for cold intolerance.  Musculoskeletal: Positive for arthralgias, gait problem, joint swelling and myalgias.  Allergic/Immunologic: Positive for environmental allergies.    Past Medical History:  Diagnosis Date  . Breast cancer ([place]) 03/11/2012   Stage I (T1b N0 M0), grade 1 well-differentiated carcinoma of the left breast status, post lumpectomy followed by radiation therapy. Her estrogen receptor receptors were 93%, progesterone receptors 67%. HER-2/neu was negative. No lymphovascular space invasion was seen. All margins were clear. Ki-67 marker was low at 1% with surgery on 11/15/2004. Treated then with post-lumpectomy radiation, finish  . Breast cancer, left breast ([place])   . COPD (chronic obstructive pulmonary disease) ([place])   . Diabetes mellitus without complication ([place])   . Diverticula of colon   . Hyperlipidemia   . Hypertension   . Kidney stones   . Nicotine dependence   . Osteoarthritis     Past Surgical History:  Procedure Laterality Date  . ABDOMINAL HYSTERECTOMY    . BREAST SURGERY  2005 approx   left lumpectomy   . CHOLECYSTECTOMY N/A 05/26/2014   Procedure: LAPAROSCOPIC CHOLECYSTECTOMY;  Surgeon: Aviva Signs Md, MD;  Location: [facility];  Service: General;  Laterality: N/A;  . COLECTOMY     2005, diverticulitis  . DILATION AND CURETTAGE OF UTERUS     . left breast      cancer, in 2006  . TUBAL LIGATION      Family History  Problem Relation Age of Onset  . Cancer Mother     breast  . COPD Father   . Cancer Sister     cancer    Social History Social History  Substance Use Topics  . Smoking status: Current Every Day Smoker    Packs/day: 0.50    Years: 50.00    Types: Cigarettes  . Smokeless tobacco: Never Used     Comment: cigaretes  now 10 to 15 per day  . Alcohol use No    Allergies  Allergen Reactions  . Penicillins Hives, Shortness Of Breath and Swelling  . Sulfonamide Derivatives Other (See Comments)    Shaking all over, seizure like symptoms. Hospitalization resulted     Current Outpatient Prescriptions  Medication Sig Dispense Refill  . acetaminophen (TYLENOL) 500 MG tablet Take 500-1,000 mg by mouth every 6 (six) hours as needed for moderate pain.     Marland Kitchen albuterol (PROVENTIL) (2.5 MG/3ML) 0.083% nebulizer solution USE 1 VIAL IN NEBULIZER 3 TIMES DAILY. 300 mL 0  . amLODipine (NORVASC) 5 MG tablet TAKE 1 TABLET BY MOUTH ONCE A DAY. 30 tablet 3  . aspirin EC 81 MG tablet Take 81 mg by mouth daily.    . ATROVENT HFA 17 MCG/ACT inhaler INHALE 2 PUFFS BY MOUTH FOUR TIMES A DAY. 12.9 g 1  . azelastine (ASTELIN) 0.1 % nasal spray Place  2 sprays into both nostrils 2 (two) times daily. Use in each nostril as directed 30 mL 12  . benazepril (LOTENSIN) 40 MG tablet TAKE ONE TABLET BY MOUTH DAILY. 90 tablet 0  . benzonatate (TESSALON) 100 MG capsule Take 1 capsule (100 mg total) by mouth 2 (two) times daily as needed for cough. 20 capsule 0  . Calcium Carbonate-Vitamin D (CALCIUM 600 + D PO) Take 1 tablet by mouth 2 (two) times daily.     Marland Kitchen levofloxacin (LEVAQUIN) 500 MG tablet Take 1 tablet (500 mg total) by mouth daily. 10 tablet 0  . loratadine (CLARITIN) 10 MG tablet TAKE 1 TABLET BY MOUTH ONCE DAILY FOR ALLERGIES. 30 tablet 6  . montelukast (SINGULAIR) 10 MG tablet TAKE 1 TABLET BY MOUTH AT BEDTIME. 30 tablet 3  .  Omega-3 Fatty Acids (FISH OIL) 1000 MG CAPS Take 2 capsules by mouth daily.    . polyethylene glycol powder (GLYCOLAX/MIRALAX) powder MIX 1 CAPFUL IN 8 OUNCES OF JUICE OR WATER AND DRINK ONCE DAILY. 527 g 3  . potassium chloride SA (K-DUR,KLOR-CON) 20 MEQ tablet TAKE 1 TABLET BY MOUTH DAILY. 30 tablet 3  . simvastatin (ZOCOR) 20 MG tablet TAKE (1) TABLET BY MOUTH DAILY AT 6 PM FOR CHOLESTEROL. 30 tablet 3  . SYMBICORT 160-4.5 MCG/ACT inhaler INHALE 2 PUFFS INTO THE LUNGS TWICE DAILY. 10.2 g 1  . tiZANidine (ZANAFLEX) 4 MG tablet TAKE ONE TABLET EVERY 8 HOURS AS NEEDED FOR MUSCLE SPASMS. 45 tablet 1  . traMADol (ULTRAM) 50 MG tablet Take 1 tablet (50 mg total) by mouth every 6 (six) hours as needed. 60 tablet 3   Current Facility-Administered Medications  Medication Dose Route Frequency Provider Last Rate Last Dose  . Influenza (>/= 3 years) inactive virus vaccine (FLVIRIN/FLUZONE) injection SUSP 0.5 mL  0.5 mL Intramuscular Once [name], MD         Physical Exam  Blood pressure 134/84, pulse 95, resp. rate 18, height 5' 3" (1.6 m), weight 157 lb (71.2 kg).  Constitutional: overall normal hygiene, normal nutrition, well developed, normal grooming, normal body habitus. Assistive device:none  Musculoskeletal: gait and station Limp none, muscle tone and strength are normal, no tremors or atrophy is present.  .  Neurological: coordination overall normal.  Deep tendon reflex/nerve stretch intact.  Sensation normal.  Cranial nerves II-XII intact.   Skin:   Normal overall no scars, lesions, ulcers or rashes. No psoriasis.  Psychiatric: Alert and oriented x 3.  Recent memory intact, remote memory unclear.  Normal mood and affect. Well groomed.  Good eye contact.  Cardiovascular: overall no swelling, no varicosities, no edema bilaterally, normal temperatures of the legs and arms, no clubbing, cyanosis and good capillary refill.  Lymphatic: palpation is normal.  She has full ROM of  both hips, the right has slight tenderness of the trochanteric area.  She has no redness.  NV is intact.  She continues to smoke but is cutting back some.    The patient has been educated about the nature of the problem(s) and counseled on treatment options.  The patient appeared to understand what I have discussed and is in agreement with it.  Encounter Diagnoses  Name Primary?  . Trochanteric bursitis of right hip Yes  . Diabetes mellitus type 2 in obese ([place])   . Tobacco smoker, less than 10 cigarettes per day     PLAN Call if any problems.  Precautions discussed.  Continue current medications.   Return to clinic  3 months   Electronically Signed Sanjuana Kava, MD 10/26/20171:51 PM

## 2015-11-18 ENCOUNTER — Other Ambulatory Visit: Payer: Self-pay

## 2015-11-18 MED ORDER — SIMVASTATIN 20 MG PO TABS: ORAL_TABLET | ORAL | 0 refills | Status: DC

## 2015-11-22 ENCOUNTER — Encounter: Payer: Self-pay | Admitting: Family Medicine

## 2015-11-22 ENCOUNTER — Ambulatory Visit (INDEPENDENT_AMBULATORY_CARE_PROVIDER_SITE_OTHER): Payer: Medicare Other | Admitting: Family Medicine

## 2015-11-22 VITALS — BP 138/72 | HR 83 | Resp 16 | Ht 63.0 in | Wt 159.0 lb

## 2015-11-22 DIAGNOSIS — J301 Allergic rhinitis due to pollen: Secondary | ICD-10-CM

## 2015-11-22 DIAGNOSIS — J209 Acute bronchitis, unspecified: Secondary | ICD-10-CM

## 2015-11-22 DIAGNOSIS — I10 Essential (primary) hypertension: Secondary | ICD-10-CM

## 2015-11-22 DIAGNOSIS — H9202 Otalgia, left ear: Secondary | ICD-10-CM

## 2015-11-22 DIAGNOSIS — F17200 Nicotine dependence, unspecified, uncomplicated: Secondary | ICD-10-CM

## 2015-11-22 DIAGNOSIS — E119 Type 2 diabetes mellitus without complications: Secondary | ICD-10-CM

## 2015-11-22 MED ORDER — AZITHROMYCIN 250 MG PO TABS: ORAL_TABLET | ORAL | 0 refills | Status: DC

## 2015-11-22 MED ORDER — KETOROLAC TROMETHAMINE 60 MG/2ML IM SOLN
60.0000 mg | Freq: Once | INTRAMUSCULAR | Status: AC
  Administered 2015-11-22: 60 mg via INTRAMUSCULAR

## 2015-11-22 MED ORDER — PREDNISONE 5 MG (21) PO TBPK: 5.0000 mg | ORAL_TABLET | ORAL | 0 refills | Status: DC

## 2015-11-22 MED ORDER — METHYLPREDNISOLONE ACETATE 80 MG/ML IJ SUSP
80.0000 mg | Freq: Once | INTRAMUSCULAR | Status: AC
  Administered 2015-11-22: 80 mg via INTRAMUSCULAR

## 2015-11-22 MED ORDER — BENZONATATE 100 MG PO CAPS: 100.0000 mg | ORAL_CAPSULE | Freq: Two times a day (BID) | ORAL | 0 refills | Status: DC | PRN

## 2015-11-22 NOTE — Assessment & Plan Note (Signed)
Uncontrolled.Toradol and depo medrol administered IM in the office , to be followed by a short course of oral prednisone   

## 2015-11-22 NOTE — Patient Instructions (Addendum)
F/u as before, call if you need me sooner  Injections , toradol and depo medrol in office today for neck and back pain, also short course of prednisone sent  Z pack and prednisone sent fopr bronchitis, and tessalon perles  You are referred to Dr Benjamine Mola to evaluate left ear pain  Need to quit smoking, for improved health        You Can Quit Smoking If you are ready to quit smoking or are thinking about it, congratulations! You have chosen to help yourself be healthier and live longer! There are lots of different ways to quit smoking. Nicotine gum, nicotine patches, a nicotine inhaler, or nicotine nasal spray can help with physical craving. Hypnosis, support groups, and medicines help break the habit of smoking. TIPS TO GET OFF AND STAY OFF CIGARETTES  Learn to predict your moods. Do not let a bad situation be your excuse to have a cigarette. Some situations in your life might tempt you to have a cigarette.  Ask friends and co-workers not to smoke around you.  Make your home smoke-free.  Never have "just one" cigarette. It leads to wanting another and another. Remind yourself of your decision to quit.  On a card, make a list of your reasons for not smoking. Read it at least the same number of times a day as you have a cigarette. Tell yourself everyday, "I do not want to smoke. I choose not to smoke."  Ask someone at home or work to help you with your plan to quit smoking.  Have something planned after you eat or have a cup of coffee. Take a walk or get other exercise to perk you up. This will help to keep you from overeating.  Try a relaxation exercise to calm you down and decrease your stress. Remember, you may be tense and nervous the first two weeks after you quit. This will pass.  Find new activities to keep your hands busy. Play with a pen, coin, or rubber band. Doodle or draw things on paper.  Brush your teeth right after eating. This will help cut down the craving for the  taste of tobacco after meals. You can try mouthwash too.  Try gum, breath mints, or diet candy to keep something in your mouth. IF YOU SMOKE AND WANT TO QUIT:  Do not stock up on cigarettes. Never buy a carton. Wait until one pack is finished before you buy another.  Never carry cigarettes with you at work or at home.  Keep cigarettes as far away from you as possible. Leave them with someone else.  Never carry matches or a lighter with you.  Ask yourself, "Do I need this cigarette or is this just a reflex?"  Bet with someone that you can quit. Put cigarette money in a piggy bank every morning. If you smoke, you give up the money. If you do not smoke, by the end of the week, you keep the money.  Keep trying. It takes 21 days to change a habit!  Talk to your doctor about using medicines to help you quit. These include nicotine replacement gum, lozenges, or skin patches.   This information is not intended to replace advice given to you by your health care provider. Make sure you discuss any questions you have with your health care provider.   Document Released: 10/28/2008 Document Revised: 03/26/2011 Document Reviewed: 10/28/2008 Elsevier Interactive Patient Education [employer].

## 2015-11-22 NOTE — Assessment & Plan Note (Addendum)
1 week h/o cough, sputum, intermittent chills,antibiotic , decongestant and cough suppressant prescribed

## 2015-11-22 NOTE — Assessment & Plan Note (Signed)

## 2015-11-22 NOTE — Assessment & Plan Note (Signed)
Chronic left ear pain needs ENT eval

## 2015-11-25 ENCOUNTER — Other Ambulatory Visit: Payer: Self-pay | Admitting: Orthopaedic Surgery

## 2015-11-27 ENCOUNTER — Encounter: Payer: Self-pay | Admitting: Family Medicine

## 2015-11-27 NOTE — Assessment & Plan Note (Signed)
Uncontrolled , short course of prednisone prescribed

## 2015-11-27 NOTE — Progress Notes (Signed)
   [name]     MRN: [id number]      DOB: 12/12/1945   HPI Ms. Scripter is here for follow up and re-evaluation of chronic medical conditions, medication management and review of any available recent lab and radiology data.  Preventive health is updated, specifically  Cancer screening and Immunization.   Questions or concerns regarding consultations or procedures which the PT has had in the interim are  addressed. The PT denies any adverse reactions to current medications since the last visit.  1 month h/o ear pain, no drainage or hearing loss n noted 1 week h/o increased cough with yellow sputum   ROS . Denies chest pains, palpitations and leg swelling Denies abdominal pain, nausea, vomiting,diarrhea or constipation.   Denies dysuria, frequency, hesitancy or incontinence. C/o increased and uncontrolled neck and back pain as her chronic pain management is being adjusted by her orthopod Denies headaches, seizures, numbness, or tingling. Denies depression, anxiety or insomnia. Denies skin break down or rash.   PE  BP 138/72   Pulse 83   Resp 16   Ht 5\' 3"  (1.6 m)   Wt 159 lb (72.1 kg)   SpO2 94%   BMI 28.17 kg/m   Patient alert and oriented and in no cardiopulmonary distress.  HEENT: No facial asymmetry, EOMI,   oropharynx pink and moist.  Neck supple no JVD, no mass.TM clear. Erythema and edema of nasal mucosa  Chest: decreased air entry ,scattered crackles, fewq wheezes  CVS: S1, S2 no murmurs, no S3.Regular rate.  ABD: Soft non tender.   Ext: No edema  MS: Adequate  Though reduced ROM spine, shoulders, hips and knees.  Skin: Intact, no ulcerations or rash noted.  Psych: Good eye contact, normal affect. Memory intact not anxious or depressed appearing.  CNS: CN 2-12 intact, power,  normal throughout.no focal deficits noted.   Assessment & Plan  Acute bronchitis 1 week h/o cough, sputum, intermittent chills,antibiotic , decongestant and cough suppressant  prescribed  NICOTINE ADDICTION Patient counseled for approximately 5 minutes regarding the health risks of ongoing nicotine use, specifically all types of cancer, heart disease, stroke and respiratory failure. The options available for help with cessation ,the behavioral changes to assist the process, and the option to either gradully reduce usage  Or abruptly stop.is also discussed. Pt is also encouraged to set specific goals in number of cigarettes used daily, as well as to set a quit date.     GENERALIZED OSTEOARTHROSIS UNSPECIFIED SITE Uncontrolled.Toradol and depo medrol administered IM in the office , to be followed by a short course of oral prednisone .   Otalgia, left ear Chronic left ear pain needs ENT eval  Allergic rhinitis Uncontrolled , short course of prednisone prescribed  Hypertension goal BP (blood pressure) < 130/80 Controlled, no change in medication DASH diet and commitment to daily physical activity for a minimum of 30 minutes discussed and encouraged, as a part of hypertension management. The importance of attaining a healthy weight is also discussed.  BP/Weight 11/22/2015 11/10/2015 10/13/2015 08/31/2015 08/10/2015 06/08/2015 [id number]  Systolic BP 0000000 Q000111Q [id number] 0000000 Q000111Q Q000111Q  Diastolic BP 72 84 72 74 72 80 66  Wt. (Lbs) 159 157 162 163 163.6 165 164  BMI 28.17 27.81 28.7 28.87 28.98 28.31 28.14

## 2015-11-27 NOTE — Assessment & Plan Note (Signed)
Controlled, no change in medication DASH diet and commitment to daily physical activity for a minimum of 30 minutes discussed and encouraged, as a part of hypertension management. The importance of attaining a healthy weight is also discussed.  BP/Weight 11/22/2015 11/10/2015 10/13/2015 08/31/2015 08/10/2015 06/08/2015 123456  Systolic BP 0000000 Q000111Q [id number] 0000000 Q000111Q Q000111Q  Diastolic BP 72 84 72 74 72 80 66  Wt. (Lbs) 159 157 162 163 163.6 165 164  BMI 28.17 27.81 28.7 28.87 28.98 28.31 28.14

## 2015-11-27 NOTE — Assessment & Plan Note (Signed)
Controlled , no change in management 

## 2015-11-28 ENCOUNTER — Other Ambulatory Visit: Payer: Self-pay | Admitting: *Deleted

## 2015-11-28 ENCOUNTER — Other Ambulatory Visit: Payer: Self-pay | Admitting: Family Medicine

## 2015-11-28 ENCOUNTER — Telehealth: Payer: Self-pay | Admitting: Orthopedic Surgery

## 2015-11-28 MED ORDER — TRAMADOL HCL 50 MG PO TABS: 50.0000 mg | ORAL_TABLET | Freq: Four times a day (QID) | ORAL | 3 refills | Status: DC | PRN

## 2015-11-28 MED ORDER — IBUPROFEN 800 MG PO TABS: 800.0000 mg | ORAL_TABLET | Freq: Three times a day (TID) | ORAL | 0 refills | Status: DC | PRN

## 2015-11-28 NOTE — Telephone Encounter (Signed)
yes

## 2015-11-28 NOTE — Progress Notes (Unsigned)
bu

## 2015-11-28 NOTE — Telephone Encounter (Signed)
Tramadol(Ultram) 50 mg  Qty 60 Tablets   Refills 3  Take 1 tablet (50mg  total) by mouth every 6 (six) hours as needed.

## 2015-11-28 NOTE — Telephone Encounter (Signed)
Patient requesting ibuprofen 800mg  instead of tramadol, may I send?

## 2015-11-29 ENCOUNTER — Other Ambulatory Visit: Payer: Self-pay | Admitting: *Deleted

## 2015-11-29 MED ORDER — IBUPROFEN 800 MG PO TABS: 800.0000 mg | ORAL_TABLET | Freq: Three times a day (TID) | ORAL | 0 refills | Status: DC | PRN

## 2015-11-29 NOTE — Telephone Encounter (Signed)
DONE

## 2015-11-30 ENCOUNTER — Telehealth: Payer: Self-pay

## 2015-11-30 DIAGNOSIS — R05 Cough: Secondary | ICD-10-CM

## 2015-11-30 DIAGNOSIS — R058 Other specified cough: Secondary | ICD-10-CM

## 2015-11-30 NOTE — Telephone Encounter (Signed)
Patient aware of verbal orders   Will have aide to collect supplies for sputum culture.

## 2015-11-30 NOTE — Telephone Encounter (Signed)
Needs sputum c/s and CXR so can be documented

## 2015-12-01 ENCOUNTER — Telehealth: Payer: Self-pay | Admitting: Orthopaedic Surgery

## 2015-12-02 ENCOUNTER — Other Ambulatory Visit (HOSPITAL_COMMUNITY)
Admission: RE | Admit: 2015-12-02 | Discharge: 2015-12-02 | Disposition: A | Discharge status: Home / Self Care | Payer: Medicare Other | Attending: Family Medicine | Admitting: Family Medicine

## 2015-12-02 ENCOUNTER — Ambulatory Visit (HOSPITAL_COMMUNITY)
Admission: RE | Admit: 2015-12-02 | Discharge: 2015-12-02 | Disposition: A | Discharge status: Home / Self Care | Payer: Medicare Other | Source: Ambulatory Visit | Attending: Family Medicine | Admitting: Family Medicine

## 2015-12-02 ENCOUNTER — Other Ambulatory Visit: Payer: Self-pay | Admitting: Family Medicine

## 2015-12-02 ENCOUNTER — Encounter: Payer: Self-pay | Admitting: Family Medicine

## 2015-12-02 DIAGNOSIS — R05 Cough: Secondary | ICD-10-CM | POA: Diagnosis not present

## 2015-12-02 DIAGNOSIS — R079 Chest pain, unspecified: Secondary | ICD-10-CM | POA: Diagnosis not present

## 2015-12-02 DIAGNOSIS — R058 Other specified cough: Secondary | ICD-10-CM

## 2015-12-02 MED ORDER — LEVOFLOXACIN 500 MG PO TABS: 500.0000 mg | ORAL_TABLET | Freq: Every day | ORAL | 0 refills | Status: DC

## 2015-12-04 LAB — CULTURE, RESPIRATORY W GRAM STAIN: Culture: NORMAL

## 2015-12-04 LAB — CULTURE, RESPIRATORY: GRAM STAIN: NONE SEEN

## 2015-12-05 NOTE — Telephone Encounter (Signed)
Patient called back to relay that she does now want to refill the (1)  Tramadol(Ultram) 50mg  Quantity 60 tablets, refills 3  and also (2) the medication tiZANidine (ZANAFLEX) 4 MG tablet 45 tablet 1 09/29/2015   - uses [facility]

## 2015-12-05 NOTE — Telephone Encounter (Signed)
Called back to patient, notified.

## 2015-12-05 NOTE — Telephone Encounter (Signed)
done

## 2015-12-06 ENCOUNTER — Other Ambulatory Visit: Payer: Self-pay | Admitting: Family Medicine

## 2015-12-15 ENCOUNTER — Other Ambulatory Visit: Payer: Self-pay | Admitting: Family Medicine

## 2015-12-15 ENCOUNTER — Ambulatory Visit (INDEPENDENT_AMBULATORY_CARE_PROVIDER_SITE_OTHER): Payer: Medicare Other | Admitting: Otolaryngology

## 2015-12-15 DIAGNOSIS — H9202 Otalgia, left ear: Secondary | ICD-10-CM | POA: Diagnosis not present

## 2015-12-15 DIAGNOSIS — H903 Sensorineural hearing loss, bilateral: Secondary | ICD-10-CM | POA: Diagnosis not present

## 2015-12-29 ENCOUNTER — Other Ambulatory Visit: Payer: Self-pay | Admitting: Family Medicine

## 2016-01-02 ENCOUNTER — Other Ambulatory Visit: Payer: Self-pay | Admitting: Orthopaedic Surgery

## 2016-01-02 ENCOUNTER — Other Ambulatory Visit: Payer: Self-pay | Admitting: Family Medicine

## 2016-01-03 ENCOUNTER — Other Ambulatory Visit: Payer: Self-pay

## 2016-01-03 MED ORDER — POTASSIUM CHLORIDE CRYS ER 20 MEQ PO TBCR: 20.0000 meq | EXTENDED_RELEASE_TABLET | Freq: Every day | ORAL | 0 refills | Status: DC

## 2016-01-04 ENCOUNTER — Other Ambulatory Visit: Payer: Self-pay

## 2016-01-04 ENCOUNTER — Ambulatory Visit (INDEPENDENT_AMBULATORY_CARE_PROVIDER_SITE_OTHER): Payer: Medicare Other

## 2016-01-04 ENCOUNTER — Other Ambulatory Visit: Payer: Self-pay | Admitting: Family Medicine

## 2016-01-04 VITALS — BP 138/70 | HR 98 | Temp 99.5°F | Resp 20 | Ht 63.0 in | Wt 158.1 lb

## 2016-01-04 DIAGNOSIS — Z0000 Encounter for general adult medical examination without abnormal findings: Secondary | ICD-10-CM | POA: Diagnosis not present

## 2016-01-04 DIAGNOSIS — Z1231 Encounter for screening mammogram for malignant neoplasm of breast: Secondary | ICD-10-CM | POA: Diagnosis not present

## 2016-01-04 DIAGNOSIS — Z13820 Encounter for screening for osteoporosis: Secondary | ICD-10-CM | POA: Diagnosis not present

## 2016-01-04 MED ORDER — TIZANIDINE HCL 4 MG PO TABS: ORAL_TABLET | ORAL | 0 refills | Status: DC

## 2016-01-04 NOTE — Patient Instructions (Addendum)
Health maintenance:Due for your diabetic eye exam, please call Dr. Gershon Crane and have this scheduled. Bone Density scan ordered today.  Abnormal screenings: None   Patient concerns: Would like Tizanidine refilled. Nodule on chest, recommend follow up with oncologist.  Nurse concerns: Need diabetic eye exam   Next PCP appt: 01/24/2016 at 1:30 pm   Advance directive discussed with patient today. Copy provided for patient to complete at home and have notarized. Patient agrees to have copy sent to our office once it is complete.   Health Maintenance, Female Introduction Adopting a healthy lifestyle and getting preventive care can go a long way to promote health and wellness. Talk with your health care provider about what schedule of regular examinations is right for you. This is a good chance for you to check in with your provider about disease prevention and staying healthy. In between checkups, there are plenty of things you can do on your own. Experts have done a lot of research about which lifestyle changes and preventive measures are most likely to keep you healthy. Ask your health care provider for more information. Weight and diet Eat a healthy diet  Be sure to include plenty of vegetables, fruits, low-fat dairy products, and lean protein.  Do not eat a lot of foods high in solid fats, added sugars, or salt.  Get regular exercise. This is one of the most important things you can do for your health.  Most adults should exercise for at least 150 minutes each week. The exercise should increase your heart rate and make you sweat (moderate-intensity exercise).  Most adults should also do strengthening exercises at least twice a week. This is in addition to the moderate-intensity exercise. Maintain a healthy weight  Body mass index (BMI) is a measurement that can be used to identify possible weight problems. It estimates body fat based on height and weight. Your health care provider can  help determine your BMI and help you achieve or maintain a healthy weight.  For females 68 years of age and older:  A BMI below 18.5 is considered underweight.  A BMI of 18.5 to 24.9 is normal.  A BMI of 25 to 29.9 is considered overweight.  A BMI of 30 and above is considered obese. Watch levels of cholesterol and blood lipids  You should start having your blood tested for lipids and cholesterol at 70 years of age, then have this test every 5 years.  You may need to have your cholesterol levels checked more often if:  Your lipid or cholesterol levels are high.  You are older than 71 years of age.  You are at high risk for heart disease. Cancer screening Lung Cancer  Lung cancer screening is recommended for adults 51-63 years old who are at high risk for lung cancer because of a history of smoking.  A yearly low-dose CT scan of the lungs is recommended for people who:  Currently smoke.  Have quit within the past 15 years.  Have at least a 30-pack-year history of smoking. A pack year is smoking an average of one pack of cigarettes a day for 1 year.  Yearly screening should continue until it has been 15 years since you quit.  Yearly screening should stop if you develop a health problem that would prevent you from having lung cancer treatment. Breast Cancer  Practice breast self-awareness. This means understanding how your breasts normally appear and feel.  It also means doing regular breast self-exams. Let your health care provider  know about any changes, no matter how small.  If you are in your 20s or 30s, you should have a clinical breast exam (CBE) by a health care provider every 1-3 years as part of a regular health exam.  If you are 63 or older, have a CBE every year. Also consider having a breast X-ray (mammogram) every year.  If you have a family history of breast cancer, talk to your health care provider about genetic screening.  If you are at high risk for  breast cancer, talk to your health care provider about having an MRI and a mammogram every year.  Breast cancer gene (BRCA) assessment is recommended for women who have family members with BRCA-related cancers. BRCA-related cancers include:  Breast.  Ovarian.  Tubal.  Peritoneal cancers.  Results of the assessment will determine the need for genetic counseling and BRCA1 and BRCA2 testing. Cervical Cancer  Your health care provider may recommend that you be screened regularly for cancer of the pelvic organs (ovaries, uterus, and vagina). This screening involves a pelvic examination, including checking for microscopic changes to the surface of your cervix (Pap test). You may be encouraged to have this screening done every 3 years, beginning at age 75.  For women ages 16-65, health care providers may recommend pelvic exams and Pap testing every 3 years, or they may recommend the Pap and pelvic exam, combined with testing for human papilloma virus (HPV), every 5 years. Some types of HPV increase your risk of cervical cancer. Testing for HPV may also be done on women of any age with unclear Pap test results.  Other health care providers may not recommend any screening for nonpregnant women who are considered low risk for pelvic cancer and who do not have symptoms. Ask your health care provider if a screening pelvic exam is right for you.  If you have had past treatment for cervical cancer or a condition that could lead to cancer, you need Pap tests and screening for cancer for at least 20 years after your treatment. If Pap tests have been discontinued, your risk factors (such as having a new sexual partner) need to be reassessed to determine if screening should resume. Some women have medical problems that increase the chance of getting cervical cancer. In these cases, your health care provider may recommend more frequent screening and Pap tests. Colorectal Cancer  This type of cancer can be  detected and often prevented.  Routine colorectal cancer screening usually begins at 71 years of age and continues through 70 years of age.  Your health care provider may recommend screening at an earlier age if you have risk factors for colon cancer.  Your health care provider may also recommend using home test kits to check for hidden blood in the stool.  A small camera at the end of a tube can be used to examine your colon directly (sigmoidoscopy or colonoscopy). This is done to check for the earliest forms of colorectal cancer.  Routine screening usually begins at age 16.  Direct examination of the colon should be repeated every 5-10 years through 71 years of age. However, you may need to be screened more often if early forms of precancerous polyps or small growths are found. Skin Cancer  Check your skin from head to toe regularly.  Tell your health care provider about any new moles or changes in moles, especially if there is a change in a mole's shape or color.  Also tell your health care provider  if you have a mole that is larger than the size of a pencil eraser.  Always use sunscreen. Apply sunscreen liberally and repeatedly throughout the day.  Protect yourself by wearing long sleeves, pants, a wide-brimmed hat, and sunglasses whenever you are outside. Heart disease, diabetes, and high blood pressure  High blood pressure causes heart disease and increases the risk of stroke. High blood pressure is more likely to develop in:  People who have blood pressure in the high end of the normal range (130-139/85-89 mm Hg).  People who are overweight or obese.  People who are African American.  If you are 69-31 years of age, have your blood pressure checked every 3-5 years. If you are 74 years of age or older, have your blood pressure checked every year. You should have your blood pressure measured twice-once when you are at a hospital or clinic, and once when you are not at a hospital  or clinic. Record the average of the two measurements. To check your blood pressure when you are not at a hospital or clinic, you can use:  An automated blood pressure machine at a pharmacy.  A home blood pressure monitor.  If you are between 33 years and 61 years old, ask your health care provider if you should take aspirin to prevent strokes.  Have regular diabetes screenings. This involves taking a blood sample to check your fasting blood sugar level.  If you are at a normal weight and have a low risk for diabetes, have this test once every three years after 71 years of age.  If you are overweight and have a high risk for diabetes, consider being tested at a younger age or more often. Preventing infection Hepatitis B  If you have a higher risk for hepatitis B, you should be screened for this virus. You are considered at high risk for hepatitis B if:  You were born in a country where hepatitis B is common. Ask your health care provider which countries are considered high risk.  Your parents were born in a high-risk country, and you have not been immunized against hepatitis B (hepatitis B vaccine).  You have HIV or AIDS.  You use needles to inject street drugs.  You live with someone who has hepatitis B.  You have had sex with someone who has hepatitis B.  You get hemodialysis treatment.  You take certain medicines for conditions, including cancer, organ transplantation, and autoimmune conditions. Hepatitis C  Blood testing is recommended for:  Everyone born from [birth year] through 1966.  Anyone with known risk factors for hepatitis C. Sexually transmitted infections (STIs)  You should be screened for sexually transmitted infections (STIs) including gonorrhea and chlamydia if:  You are sexually active and are younger than 71 years of age.  You are older than 71 years of age and your health care provider tells you that you are at risk for this type of infection.  Your sexual  activity has changed since you were last screened and you are at an increased risk for chlamydia or gonorrhea. Ask your health care provider if you are at risk.  If you do not have HIV, but are at risk, it may be recommended that you take a prescription medicine daily to prevent HIV infection. This is called pre-exposure prophylaxis (PrEP). You are considered at risk if:  You are sexually active and do not regularly use condoms or know the HIV status of your partner(s).  You take drugs by injection.  You are sexually active with a partner who has HIV. Talk with your health care provider about whether you are at high risk of being infected with HIV. If you choose to begin PrEP, you should first be tested for HIV. You should then be tested every 3 months for as long as you are taking PrEP. Pregnancy  If you are premenopausal and you may become pregnant, ask your health care provider about preconception counseling.  If you may become pregnant, take 400 to 800 micrograms (mcg) of folic acid every day.  If you want to prevent pregnancy, talk to your health care provider about birth control (contraception). Osteoporosis and menopause  Osteoporosis is a disease in which the bones lose minerals and strength with aging. This can result in serious bone fractures. Your risk for osteoporosis can be identified using a bone density scan.  If you are 64 years of age or older, or if you are at risk for osteoporosis and fractures, ask your health care provider if you should be screened.  Ask your health care provider whether you should take a calcium or vitamin D supplement to lower your risk for osteoporosis.  Menopause may have certain physical symptoms and risks.  Hormone replacement therapy may reduce some of these symptoms and risks. Talk to your health care provider about whether hormone replacement therapy is right for you. Follow these instructions at home:  Schedule regular health, dental, and  eye exams.  Stay current with your immunizations.  Do not use any tobacco products including cigarettes, chewing tobacco, or electronic cigarettes.  If you are pregnant, do not drink alcohol.  If you are breastfeeding, limit how much and how often you drink alcohol.  Limit alcohol intake to no more than 1 drink per day for nonpregnant women. One drink equals 12 ounces of beer, 5 ounces of wine, or 1 ounces of hard liquor.  Do not use street drugs.  Do not share needles.  Ask your health care provider for help if you need support or information about quitting drugs.  Tell your health care provider if you often feel depressed.  Tell your health care provider if you have ever been abused or do not feel safe at home. This information is not intended to replace advice given to you by your health care provider. Make sure you discuss any questions you have with your health care provider. Document Released: 07/17/2010 Document Revised: 06/09/2015 Document Reviewed: 10/05/2014  2017 Elsevier   Steps to Quit Smoking Smoking tobacco can be harmful to your health and can affect almost every organ in your body. Smoking puts you, and those around you, at risk for developing many serious chronic diseases. Quitting smoking is difficult, but it is one of the best things that you can do for your health. It is never too late to quit. What are the benefits of quitting smoking? When you quit smoking, you lower your risk of developing serious diseases and conditions, such as:  Lung cancer or lung disease, such as COPD.  Heart disease.  Stroke.  Heart attack.  Infertility.  Osteoporosis and bone fractures. Additionally, symptoms such as coughing, wheezing, and shortness of breath may get better when you quit. You may also find that you get sick less often because your body is stronger at fighting off colds and infections. If you are pregnant, quitting smoking can help to reduce your chances of  having a baby of low birth weight. How do I get ready to quit? When  you decide to quit smoking, create a plan to make sure that you are successful. Before you quit:  Pick a date to quit. Set a date within the next two weeks to give you time to prepare.  Write down the reasons why you are quitting. Keep this list in places where you will see it often, such as on your bathroom mirror or in your car or wallet.  Identify the people, places, things, and activities that make you want to smoke (triggers) and avoid them. Make sure to take these actions:  Throw away all cigarettes at home, at work, and in your car.  Throw away smoking accessories, such as Scientist, research (medical).  Clean your car and make sure to empty the ashtray.  Clean your home, including curtains and carpets.  Tell your family, friends, and coworkers that you are quitting. Support from your loved ones can make quitting easier.  Talk with your health care provider about your options for quitting smoking.  Find out what treatment options are covered by your health insurance. What strategies can I use to quit smoking? Talk with your healthcare provider about different strategies to quit smoking. Some strategies include:  Quitting smoking altogether instead of gradually lessening how much you smoke over a period of time. Research shows that quitting "cold [place]" is more successful than gradually quitting.  Attending in-person counseling to help you build problem-solving skills. You are more likely to have success in quitting if you attend several counseling sessions. Even short sessions of 10 minutes can be effective.  Finding resources and support systems that can help you to quit smoking and remain smoke-free after you quit. These resources are most helpful when you use them often. They can include:  Online chats with a Social worker.  Telephone quitlines.  Printed Furniture conservator/restorer.  Support groups or group  counseling.  Text messaging programs.  Mobile phone applications.  Taking medicines to help you quit smoking. (If you are pregnant or breastfeeding, talk with your health care provider first.) Some medicines contain nicotine and some do not. Both types of medicines help with cravings, but the medicines that include nicotine help to relieve withdrawal symptoms. Your health care provider may recommend:  Nicotine patches, gum, or lozenges.  Nicotine inhalers or sprays.  Non-nicotine medicine that is taken by mouth. Talk with your health care provider about combining strategies, such as taking medicines while you are also receiving in-person counseling. Using these two strategies together makes you more likely to succeed in quitting than if you used either strategy on its own. If you are pregnant or breastfeeding, talk with your health care provider about finding counseling or other support strategies to quit smoking. Do not take medicine to help you quit smoking unless told to do so by your health care provider. What things can I do to make it easier to quit? Quitting smoking might feel overwhelming at first, but there is a lot that you can do to make it easier. Take these important actions:  Reach out to your family and friends and ask that they support and encourage you during this time. Call telephone quitlines, reach out to support groups, or work with a counselor for support.  Ask people who smoke to avoid smoking around you.  Avoid places that trigger you to smoke, such as bars, parties, or smoke-break areas at work.  Spend time around people who do not smoke.  Lessen stress in your life, because stress can be a smoking trigger for  some people. To lessen stress, try:  Exercising regularly.  Deep-breathing exercises.  Yoga.  Meditating.  Performing a body scan. This involves closing your eyes, scanning your body from head to toe, and noticing which parts of your body are  particularly tense. Purposefully relax the muscles in those areas.  Download or purchase mobile phone or tablet apps (applications) that can help you stick to your quit plan by providing reminders, tips, and encouragement. There are many free apps, such as QuitGuide from the [employer] Office manager for Disease Control and Prevention). You can find other support for quitting smoking (smoking cessation) through smokefree.gov and other websites. How will I feel when I quit smoking? Within the first 24 hours of quitting smoking, you may start to feel some withdrawal symptoms. These symptoms are usually most noticeable 2-3 days after quitting, but they usually do not last beyond 2-3 weeks. Changes or symptoms that you might experience include:  Mood swings.  Restlessness, anxiety, or irritation.  Difficulty concentrating.  Dizziness.  Strong cravings for sugary foods in addition to nicotine.  Mild weight gain.  Constipation.  Nausea.  Coughing or a sore throat.  Changes in how your medicines work in your body.  A depressed mood.  Difficulty sleeping (insomnia). After the first 2-3 weeks of quitting, you may start to notice more positive results, such as:  Improved sense of smell and taste.  Decreased coughing and sore throat.  Slower heart rate.  Lower blood pressure.  Clearer skin.  The ability to breathe more easily.  Fewer sick days. Quitting smoking is very challenging for most people. Do not get discouraged if you are not successful the first time. Some people need to make many attempts to quit before they achieve long-term success. Do your best to stick to your quit plan, and talk with your health care provider if you have any questions or concerns. This information is not intended to replace advice given to you by your health care provider. Make sure you discuss any questions you have with your health care provider. Document Released: 12/26/2000 Document Revised: 08/30/2015  Document Reviewed: 05/18/2014 Elsevier Interactive Patient Education  2017 [employer].

## 2016-01-04 NOTE — Progress Notes (Addendum)
Subjective:   [name] is a 70 y.o. female who presents for Medicare Annual (Subsequent) preventive examination.  Review of Systems:   Cardiac Risk Factors include: advanced age (>35mn, >>66women);diabetes mellitus;family history of premature cardiovascular disease;hypertension;dyslipidemia;smoking/ tobacco exposure     Objective:     Vitals: BP 138/70   Pulse 98   Temp 99.5 F (37.5 C) (Oral)   Resp 20   Ht _0  (1.6 m)   Wt 158 lb 1.9 oz (71.7 kg)   SpO2 98%   BMI 28.01 kg/m   Body mass index is 28.01 kg/m.   Tobacco History  Smoking Status  . Current Every Day Smoker  . Packs/day: 0.50  . Years: 50.00  . Types: Cigarettes  Smokeless Tobacco  . Never Used    Comment: cigaretes  now 10 to 15 per day     Ready to quit: No Counseling given: Yes   Past Medical History:  Diagnosis Date  . Breast [place] ([facility] 03[place]   Stage I (T1b N0 M0), grade 1 well-differentiated carcinoma of the left breast status, post lumpectomy followed by radiation therapy. Her estrogen receptor receptors were 93%, progesterone receptors 67%. HER-2/neu was negative. No lymphovascular space invasion was seen. All margins were clear. Ki-67 marker was low at 1% with surgery on 11/15/2004. Treated then with post-lumpectomy radiation, finish  . Breast cancer, left breast ([facility]   . COPD [place]ch[place] obstructive pulmonary disease) ([facility]   . Diabetes mellitus without complication ([facility]   . Diverticula of colon   . Hyperlipidemia   . Hypertension   . Kidney stones   . Nicotine dependence   . Osteoarthritis    Past Surgical History:  Procedure Laterality Date  . ABDOMINAL HYSTERECTOMY    . BREAST SURGERY  2005 approx   left lumpectomy   . CHOLECYSTECTOMY N/A 05/26/2014   Procedure: LAPAROSCOPIC CHOLECYSTECTOMY;  Surgeon: MAviva SignsMd, MD;  Location: [facility];  Service: General;  Laterality: N/A;  . COLECTOMY     2005, diverticulitis  . DILATION AND CURETTAGE OF UTERUS    . left  breast      cancer, in 2006  . TUBAL LIGATION     Family History  Problem Relation Age of Onset  . Cancer Mother     breast  . COPD Father   . Throat cancer Sister   . Schizophrenia Brother   . Heart attack Brother   . Lung cancer Sister     former smoker   History  Sexual Activity  . Sexual activity: No    Outpatient Encounter Prescriptions as of 01/04/2016  Medication Sig  . acetaminophen (TYLENOL) 500 MG tablet Take 500-1,000 mg by mouth every 6 (six) hours as needed for moderate pain.   .Marland KitchenamLODipine (NORVASC) 5 MG tablet TAKE 1 TABLET BY MOUTH ONCE A DAY.  .Marland Kitchenaspirin EC 81 MG tablet Take 81 mg by mouth daily.  . ATROVENT HFA 17 MCG/ACT inhaler INHALE 2 PUFFS BY MOUTH FOUR TIMES A DAY.  .Marland Kitchenazelastine (ASTELIN) 0.1 % nasal spray Place 2 sprays into both nostrils 2 (two) times daily. Use in each nostril as directed  . benazepril (LOTENSIN) 40 MG tablet TAKE ONE TABLET BY MOUTH DAILY.  . Calcium Carbonate-Vitamin D (CALCIUM 600 + D PO) Take 1 tablet by mouth 2 (two) times daily.   .Marland Kitchenibuprofen (ADVIL,MOTRIN) 800 MG tablet Take 1 tablet (800 mg total) by mouth every 8 (eight) hours as needed.  . loratadine (CLARITIN) 10  MG tablet TAKE 1 TABLET BY MOUTH ONCE DAILY FOR ALLERGIES.  Marland Kitchen montelukast (SINGULAIR) 10 MG tablet TAKE 1 TABLET BY MOUTH AT BEDTIME.  Marland Kitchen Omega-3 Fatty Acids (FISH OIL) 1000 MG CAPS Take 2 capsules by mouth daily.  . polyethylene glycol powder (GLYCOLAX/MIRALAX) powder MIX 1 CAPFUL IN 8 OUNCES OF JUICE OR WATER AND DRINK ONCE DAILY.  Marland Kitchen potassium chloride SA (K-DUR,KLOR-CON) 20 MEQ tablet Take 1 tablet (20 mEq total) by mouth daily.  . simvastatin (ZOCOR) 20 MG tablet TAKE (1) TABLET BY MOUTH DAILY AT 6 PM FOR CHOLESTEROL.  . SYMBICORT 160-4.5 MCG/ACT inhaler INHALE 2 PUFFS INTO THE LUNGS TWICE DAILY.  Marland Kitchen tiZANidine (ZANAFLEX) 4 MG tablet TAKE ONE TABLET EVERY 8 HOURS AS NEEDED FOR MUSCLE SPASMS.  Marland Kitchen traMADol (ULTRAM) 50 MG tablet TAKE (1) TABLET BY MOUTH EVERY SIX HOURS  AS NEEDED.  Marland Kitchen albuterol (PROVENTIL) (2.5 MG/3ML) 0.083% nebulizer solution USE 1 VIAL IN NEBULIZER 3 TIMES DAILY. (Patient not taking: Reported on 01/04/2016)  . [DISCONTINUED] azithromycin (ZITHROMAX) 250 MG tablet Two tablets on day one, then one daily for an additional 4 days  . [DISCONTINUED] benzonatate (TESSALON) 100 MG capsule Take 1 capsule (100 mg total) by mouth 2 (two) times daily as needed for cough.  . [DISCONTINUED] ibuprofen (ADVIL,MOTRIN) 800 MG tablet Take 1 tablet (800 mg total) by mouth every 8 (eight) hours as needed.  . [DISCONTINUED] levofloxacin (LEVAQUIN) 500 MG tablet Take 1 tablet (500 mg total) by mouth daily.  . [DISCONTINUED] predniSONE (STERAPRED UNI-PAK 21 TAB) 5 MG (21) TBPK tablet Take 1 tablet (5 mg total) by mouth as directed. Use as directed   Facility-Administered Encounter Medications as of 01/04/2016  Medication  . Influenza (>/= 3 years) inactive virus vaccine (FLVIRIN/FLUZONE) injection SUSP 0.5 mL    Activities of Daily Living In your present state of health, do you have any difficulty performing the following activities: 01/04/2016  Hearing? N  Vision? Y  Difficulty concentrating or making decisions? N  Walking or climbing stairs? N  Dressing or bathing? Y  Doing errands, shopping? N  Preparing Food and eating ? N  Using the Toilet? N  In the past six months, have you accidently leaked urine? N  Do you have problems with loss of bowel control? N  Managing your Medications? N  Managing your Finances? N  Some recent data might be hidden    Patient Care Team: [name], MD as PCP - General Sanjuana Kava, MD as Consulting Physician (Orthopedic Surgery) Patrici Ranks, MD as Consulting Physician (Hematology and Oncology)    Assessment:    Medicare annual wellness visit, subsequent Annual exam as documented. Counseling done  re healthy lifestyle involving commitment to 150 minutes exercise per week, heart healthy diet, and  attaining healthy weight.The importance of adequate sleep also discussed.  Immunization and cancer screening needs are specifically addressed at this visit.Needs low dose chest scan  Exercise Activities and Dietary recommendations Current Exercise Habits: Home exercise routine, Type of exercise: walking, Time (Minutes): 15, Frequency (Times/Week): 7, Weekly Exercise (Minutes/Week): 105, Intensity: Mild  Goals    . Quit smoking / using tobacco          Starting 01/04/2016 patient would like to cut back to 4 cigarettes a day.      Fall Risk Fall Risk  01/04/2016 05/05/2014 08/26/2012 12/31/2011  Falls in the past year? No No Yes Yes  Number falls in past yr: - - 1 1  Injury with Fall? - -  No No  Risk for fall due to : Impaired vision - - -   Depression Screen PHQ 2/9 Scores 01/04/2016 08/31/2015 05/05/2014  PHQ - 2 Score 3 0 2  PHQ- 9 Score _0 Cognitive Function  Normal   6CIT Screen 01/04/2016  What Year? 0 points  What month? 0 points  What time? 0 points  Count back from 20 0 points  Months in reverse 0 points  Repeat phrase 0 points  Total Score 0    Immunization History  Administered Date(s) Administered  . Influenza Split 10/31/2011  . Influenza Whole 11/18/2008, 10/12/2010  . Influenza,inj,Quad PF,36+ Mos 11/12/2012, 09/18/2013, 12/22/2014, 10/04/2015  . PPD Test 01/28/2012  . Pneumococcal Conjugate-13 01/04/2015  . Pneumococcal Polysaccharide-23 06/13/2010  . Tdap 01/28/2012   Screening Tests Health Maintenance  Topic Date Due  . OPHTHALMOLOGY EXAM  09/07/2015  . FOOT EXAM  01/04/2016  . HEMOGLOBIN A1C  02/25/2016  . MAMMOGRAM  02/06/2017  . COLONOSCOPY  07/11/2020  . TETANUS/TDAP  01/27/2022  . INFLUENZA VACCINE  Completed  . DEXA SCAN  Completed  . ZOSTAVAX  Completed  . Hepatitis C Screening  Completed  . PNA vac Low Risk Adult  Completed      Plan:    I have personally reviewed and addressed the Medicare Annual Wellness questionnaire and  have noted the following in the patient's chart:  A. Medical and social history B. Use of alcohol, tobacco or illicit drugs  C. Current medications and supplements D. Functional ability and status E.  Nutritional status F.  Physical activity G. Advance directives H. List of other physicians I.  Hospitalizations, surgeries, and ER visits in previous 12 months J.  [place] to include hearing, vision, cognitive, depression L. Referrals and appointments - none  In addition, I have reviewed and discussed with patient certain preventive protocols, quality metrics, and best practice recommendations. A written personalized care plan for preventive services as well as general preventive health recommendations were provided to patient.   Signed,   Stormy Fabian, LPN Lead Nurse Health Advisor

## 2016-01-04 NOTE — Addendum Note (Signed)
Addended by: Eual Fines on: 01/04/2016 04:53 PM   Modules accepted: Orders

## 2016-01-04 NOTE — Assessment & Plan Note (Signed)
Annual exam as documented. Counseling done  re healthy lifestyle involving commitment to 150 minutes exercise per week, heart healthy diet, and attaining healthy weight.The importance of adequate sleep also discussed.  Immunization and cancer screening needs are specifically addressed at this visit.Needs low dose chest scan

## 2016-01-05 ENCOUNTER — Telehealth: Payer: Self-pay

## 2016-01-05 DIAGNOSIS — I10 Essential (primary) hypertension: Secondary | ICD-10-CM

## 2016-01-05 DIAGNOSIS — E785 Hyperlipidemia, unspecified: Secondary | ICD-10-CM

## 2016-01-05 NOTE — Telephone Encounter (Signed)
Labs 1 week prior to next OV

## 2016-01-11 ENCOUNTER — Other Ambulatory Visit: Payer: Self-pay | Admitting: Family Medicine

## 2016-01-11 DIAGNOSIS — Z780 Asymptomatic menopausal state: Secondary | ICD-10-CM

## 2016-01-13 ENCOUNTER — Other Ambulatory Visit: Payer: Self-pay | Admitting: Family Medicine

## 2016-01-13 DIAGNOSIS — F17210 Nicotine dependence, cigarettes, uncomplicated: Secondary | ICD-10-CM

## 2016-01-13 DIAGNOSIS — F17218 Nicotine dependence, cigarettes, with other nicotine-induced disorders: Secondary | ICD-10-CM

## 2016-01-18 DIAGNOSIS — E785 Hyperlipidemia, unspecified: Secondary | ICD-10-CM | POA: Diagnosis not present

## 2016-01-18 DIAGNOSIS — I10 Essential (primary) hypertension: Secondary | ICD-10-CM | POA: Diagnosis not present

## 2016-01-19 ENCOUNTER — Encounter: Payer: Self-pay | Admitting: Family Medicine

## 2016-01-19 LAB — LIPID PANEL
CHOL/HDL RATIO: 2.3 ratio (ref <5.0)
Cholesterol: 165 mg/dL (ref <200)
HDL: 73 mg/dL (ref >50)
LDL CALC: 68 mg/dL (ref <100)
Triglycerides: 122 mg/dL (ref <150)
VLDL: 24 mg/dL (ref <30)

## 2016-01-19 LAB — COMPLETE METABOLIC PANEL WITH GFR
ALT: 12 U/L (ref 6–29)
AST: 16 U/L (ref 10–35)
Albumin: 4.4 g/dL (ref 3.6–5.1)
Alkaline Phosphatase: 70 U/L (ref 33–130)
BILIRUBIN TOTAL: 0.4 mg/dL (ref 0.2–1.2)
BUN: 14 mg/dL (ref 7–25)
CO2: 23 mmol/L (ref 20–31)
Calcium: 9.7 mg/dL (ref 8.6–10.4)
Chloride: 105 mmol/L (ref 98–110)
Creat: 0.63 mg/dL (ref 0.60–0.93)
GFR, Est African American: >89 mL/min (ref >=60)
GLUCOSE: 76 mg/dL (ref 65–99)
POTASSIUM: 4.4 mmol/L (ref 3.5–5.3)
SODIUM: 142 mmol/L (ref 135–146)
TOTAL PROTEIN: 6.9 g/dL (ref 6.1–8.1)

## 2016-01-19 LAB — TSH: TSH: 0.91 m[IU]/L

## 2016-01-23 ENCOUNTER — Other Ambulatory Visit: Payer: Self-pay | Admitting: Family Medicine

## 2016-01-24 ENCOUNTER — Encounter: Payer: Self-pay | Admitting: Family Medicine

## 2016-01-24 ENCOUNTER — Telehealth: Payer: Self-pay | Admitting: Orthopaedic Surgery

## 2016-01-24 ENCOUNTER — Ambulatory Visit (INDEPENDENT_AMBULATORY_CARE_PROVIDER_SITE_OTHER): Payer: Medicare Other | Admitting: Family Medicine

## 2016-01-24 ENCOUNTER — Ambulatory Visit (HOSPITAL_COMMUNITY)
Admission: RE | Admit: 2016-01-24 | Discharge: 2016-01-24 | Disposition: A | Discharge status: Home / Self Care | Payer: Medicare Other | Source: Ambulatory Visit | Attending: Family Medicine | Admitting: Family Medicine

## 2016-01-24 VITALS — BP 144/80 | HR 94 | Resp 16 | Ht 63.0 in | Wt 161.0 lb

## 2016-01-24 DIAGNOSIS — F17218 Nicotine dependence, cigarettes, with other nicotine-induced disorders: Secondary | ICD-10-CM | POA: Insufficient documentation

## 2016-01-24 DIAGNOSIS — F17200 Nicotine dependence, unspecified, uncomplicated: Secondary | ICD-10-CM

## 2016-01-24 DIAGNOSIS — R918 Other nonspecific abnormal finding of lung field: Secondary | ICD-10-CM | POA: Insufficient documentation

## 2016-01-24 DIAGNOSIS — Z122 Encounter for screening for malignant neoplasm of respiratory organs: Secondary | ICD-10-CM | POA: Diagnosis not present

## 2016-01-24 DIAGNOSIS — Z1211 Encounter for screening for malignant neoplasm of colon: Secondary | ICD-10-CM

## 2016-01-24 DIAGNOSIS — Z0000 Encounter for general adult medical examination without abnormal findings: Secondary | ICD-10-CM

## 2016-01-24 DIAGNOSIS — I2510 Atherosclerotic heart disease of native coronary artery without angina pectoris: Secondary | ICD-10-CM | POA: Insufficient documentation

## 2016-01-24 DIAGNOSIS — E119 Type 2 diabetes mellitus without complications: Secondary | ICD-10-CM

## 2016-01-24 DIAGNOSIS — J984 Other disorders of lung: Secondary | ICD-10-CM | POA: Diagnosis not present

## 2016-01-24 DIAGNOSIS — I700 Atherosclerosis of aorta: Secondary | ICD-10-CM | POA: Insufficient documentation

## 2016-01-24 DIAGNOSIS — F17210 Nicotine dependence, cigarettes, uncomplicated: Secondary | ICD-10-CM

## 2016-01-24 LAB — POC HEMOCCULT BLD/STL (OFFICE/1-CARD/DIAGNOSTIC): Fecal Occult Blood, POC: NEGATIVE

## 2016-01-24 MED ORDER — TRAMADOL HCL 50 MG PO TABS: ORAL_TABLET | ORAL | 3 refills | Status: DC

## 2016-01-24 NOTE — Telephone Encounter (Signed)
Tramadol 50 mg   Qty 60 Tablets

## 2016-01-24 NOTE — Patient Instructions (Addendum)
F/u in 4 month, call if you need me sooner  No labs due  Work in stopping smoking, now smoking 7 ciggs per day, need to quit, get help, 1800 QUIT NOW  Lung scan today at 3 pls get there safely  BP slightly high, no med change   Steps to Quit Smoking Smoking tobacco can be bad for your health. It can also affect almost every organ in your body. Smoking puts you and people around you at risk for many serious long-lasting (chronic) diseases. Quitting smoking is hard, but it is one of the best things that you can do for your health. It is never too late to quit. What are the benefits of quitting smoking? When you quit smoking, you lower your risk for getting serious diseases and conditions. They can include:  Lung cancer or lung disease.  Heart disease.  Stroke.  Heart attack.  Not being able to have children (infertility).  Weak bones (osteoporosis) and broken bones (fractures). If you have coughing, wheezing, and shortness of breath, those symptoms may get better when you quit. You may also get sick less often. If you are pregnant, quitting smoking can help to lower your chances of having a baby of low birth weight. What can I do to help me quit smoking? Talk with your doctor about what can help you quit smoking. Some things you can do (strategies) include:  Quitting smoking totally, instead of slowly cutting back how much you smoke over a period of time.  Going to in-person counseling. You are more likely to quit if you go to many counseling sessions.  Using resources and support systems, such as:  Online chats with a Social worker.  Phone quitlines.  Printed Furniture conservator/restorer.  Support groups or group counseling.  Text messaging programs.  Mobile phone apps or applications.  Taking medicines. Some of these medicines may have nicotine in them. If you are pregnant or breastfeeding, do not take any medicines to quit smoking unless your doctor says it is okay. Talk with  your doctor about counseling or other things that can help you. Talk with your doctor about using more than one strategy at the same time, such as taking medicines while you are also going to in-person counseling. This can help make quitting easier. What things can I do to make it easier to quit? Quitting smoking might feel very hard at first, but there is a lot that you can do to make it easier. Take these steps:  Talk to your family and friends. Ask them to support and encourage you.  Call phone quitlines, reach out to support groups, or work with a Social worker.  Ask people who smoke to not smoke around you.  Avoid places that make you want (trigger) to smoke, such as:  Bars.  Parties.  Smoke-break areas at work.  Spend time with people who do not smoke.  Lower the stress in your life. Stress can make you want to smoke. Try these things to help your stress:  Getting regular exercise.  Deep-breathing exercises.  Yoga.  Meditating.  Doing a body scan. To do this, close your eyes, focus on one area of your body at a time from head to toe, and notice which parts of your body are tense. Try to relax the muscles in those areas.  Download or buy apps on your mobile phone or tablet that can help you stick to your quit plan. There are many free apps, such as QuitGuide from the CDC (  Centers for Disease Control and Prevention). You can find more support from smokefree.gov and other websites. This information is not intended to replace advice given to you by your health care provider. Make sure you discuss any questions you have with your health care provider. Document Released: 10/28/2008 Document Revised: 08/30/2015 Document Reviewed: 05/18/2014 Elsevier Interactive Patient Education  2017 [employer].

## 2016-01-25 ENCOUNTER — Other Ambulatory Visit (HOSPITAL_COMMUNITY)
Admission: RE | Admit: 2016-01-25 | Discharge: 2016-01-25 | Disposition: A | Discharge status: Home / Self Care | Payer: Medicare Other | Source: Other Acute Inpatient Hospital | Attending: Family Medicine | Admitting: Family Medicine

## 2016-01-25 DIAGNOSIS — E119 Type 2 diabetes mellitus without complications: Secondary | ICD-10-CM | POA: Diagnosis not present

## 2016-01-26 LAB — MICROALBUMIN / CREATININE URINE RATIO
Creatinine, Urine: 104.9 mg/dL
MICROALB UR: 112.8 ug/mL — AB
Microalb Creat Ratio: 107.5 mg/g creat — ABNORMAL HIGH (ref 0.0–30.0)

## 2016-01-29 ENCOUNTER — Encounter: Payer: Self-pay | Admitting: Family Medicine

## 2016-01-29 NOTE — Assessment & Plan Note (Signed)

## 2016-01-29 NOTE — Progress Notes (Signed)
    [name]     MRN: [id number]      DOB: 11/20/1945  HPI: Patient is in for annual physical exam. No other health concerns are expressed or addressed at the visit. Recent labs, if available are reviewed. Immunization is reviewed , and  updated if needed.   PE: BP (!) 144/80   Pulse 94   Resp 16   Ht 5\' 3"  (1.6 m)   Wt 161 lb (73 kg)   SpO2 94%   BMI 28.52 kg/m   Pleasant  female, alert and oriented x 3, in no cardio-pulmonary distress. Afebrile. HEENT No facial trauma or asymetry. Sinuses non tender.  Extra occullar muscles intact External ears normal, tympanic membranes clear. Oropharynx moist, no exudate. Neck: supple, no adenopathy,JVD or thyromegaly.No bruits.  Chest: Clear to ascultation bilaterally.No crackles or wheezes.Decreased air entry throughout Non tender to palpation  Breast: Lumpectomy left breast,no masses or lumps. No tenderness. No nipple discharge or inversion. No axillary or supraclavicular adenopathy  Cardiovascular system; Heart sounds normal,  S1 and  S2 ,no S3.  No murmur, or thrill. Apical beat not displaced Peripheral pulses normal.  Abdomen: Soft, non tender, no organomegaly or masses. No bruits. Bowel sounds normal. No guarding, tenderness or rebound.  Rectal:  Normal sphincter tone. No rectal mass. Guaiac negative stool.  GU: External genitalia normal female genitalia , normal female distribution of hair. No lesions. Urethral meatus normal in size, no  Prolapse, no lesions visibly  Present. Bladder non tender. Vagina pink and moist , with no visible lesions , discharge present . Adequate pelvic support no  cystocele or rectocele noted Cervix pink and appears healthy, no lesions or ulcerations noted, no discharge noted from os Uterus normal size, no adnexal masses, no cervical motion or adnexal tenderness.   Musculoskeletal exam: Adequate though slightly reduced  ROM of spine, hips , shoulders and knees. No deformity  ,swelling or crepitus noted. No muscle wasting or atrophy.   Neurologic: Cranial nerves 2 to 12 intact. Power, tone ,sensation and reflexes normal throughout. No disturbance in gait. No tremor.  Skin: Intact, no ulceration, erythema , scaling or rash noted. Pigmentation normal throughout  Psych; Normal mood and affect. Judgement and concentration normal   Assessment & Plan:  Annual physical exam Annual exam as documented. Counseling done  re healthy lifestyle involving commitment to 150 minutes exercise per week, heart healthy diet, and attaining healthy weight.The importance of adequate sleep also discussed.  Changes in health habits are decided on by the patient with goals and time frames  set for achieving them. Immunization and cancer screening needs are specifically addressed at this visit.   NICOTINE ADDICTION Patient counseled for approximately 5 minutes regarding the health risks of ongoing nicotine use, specifically all types of cancer, heart disease, stroke and respiratory failure. The options available for help with cessation ,the behavioral changes to assist the process, and the option to either gradully reduce usage  Or abruptly stop.is also discussed. Pt is also encouraged to set specific goals in number of cigarettes used daily, as well as to set a quit date.

## 2016-01-29 NOTE — Assessment & Plan Note (Signed)
Annual exam as documented. Counseling done  re healthy lifestyle involving commitment to 150 minutes exercise per week, heart healthy diet, and attaining healthy weight.The importance of adequate sleep also discussed. Changes in health habits are decided on by the patient with goals and time frames  set for achieving them. Immunization and cancer screening needs are specifically addressed at this visit. 

## 2016-01-30 ENCOUNTER — Other Ambulatory Visit: Payer: Self-pay | Admitting: Family Medicine

## 2016-02-06 ENCOUNTER — Other Ambulatory Visit: Payer: Self-pay | Admitting: Family Medicine

## 2016-02-09 ENCOUNTER — Ambulatory Visit (HOSPITAL_COMMUNITY): Payer: Medicare Other

## 2016-02-09 ENCOUNTER — Ambulatory Visit (HOSPITAL_COMMUNITY)
Admission: RE | Admit: 2016-02-09 | Discharge: 2016-02-09 | Disposition: A | Discharge status: Home / Self Care | Payer: Medicare Other | Source: Ambulatory Visit | Attending: Oncology | Admitting: Oncology

## 2016-02-09 DIAGNOSIS — Z1231 Encounter for screening mammogram for malignant neoplasm of breast: Secondary | ICD-10-CM | POA: Insufficient documentation

## 2016-02-13 ENCOUNTER — Other Ambulatory Visit: Payer: Self-pay | Admitting: Family Medicine

## 2016-02-14 ENCOUNTER — Ambulatory Visit (INDEPENDENT_AMBULATORY_CARE_PROVIDER_SITE_OTHER): Payer: Medicare Other | Admitting: Orthopaedic Surgery

## 2016-02-14 VITALS — BP 148/87 | HR 84 | Temp 97.5°F | Ht 63.0 in | Wt 163.0 lb

## 2016-02-14 DIAGNOSIS — M7061 Trochanteric bursitis, right hip: Secondary | ICD-10-CM

## 2016-02-14 DIAGNOSIS — F17210 Nicotine dependence, cigarettes, uncomplicated: Secondary | ICD-10-CM

## 2016-02-14 MED ORDER — HYDROCODONE-ACETAMINOPHEN 5-325 MG PO TABS: 1.0000 | ORAL_TABLET | Freq: Four times a day (QID) | ORAL | 0 refills | Status: DC | PRN

## 2016-02-14 NOTE — Progress Notes (Signed)
Patient WI:[id number] [name], female DOB:06/03/1945, 71 y.o. [id number]  Chief Complaint  Patient presents with  . Follow-up    Trochanteric bursitis right hip    HPI  [name] is a 71 y.o. female who has chronic pain of the right hip trochanteric bursa.  She is better here. She has less pain.  She fell recently and broke a rib on the right side.  She is active and is taking her medicine. HPI  Body mass index is 28.87 kg/m.  ROS  Review of Systems  Past Medical History:  Diagnosis Date  . Breast cancer ([facility]) 03/11/2012   Stage I (T1b N0 M0), grade 1 well-differentiated carcinoma of the left breast status, post lumpectomy followed by radiation therapy. Her estrogen receptor receptors were 93%, progesterone receptors 67%. HER-2/neu was negative. No lymphovascular space invasion was seen. All margins were clear. Ki-67 marker was low at 1% with surgery on 11/15/2004. Treated then with post-lumpectomy radiation, finish  . Breast cancer, left breast ([place])   . COPD (chronic obstructive pulmonary disease) ([place])   . Diabetes mellitus without complication ([place])   . Diverticula of colon   . Hyperlipidemia   . Hypertension   . Kidney stones   . Nicotine dependence   . Osteoarthritis     Past Surgical History:  Procedure Laterality Date  . ABDOMINAL HYSTERECTOMY    . BREAST SURGERY  2005 approx   left lumpectomy   . CHOLECYSTECTOMY N/A 05/26/2014   Procedure: LAPAROSCOPIC CHOLECYSTECTOMY;  Surgeon: Aviva Signs Md, MD;  Location: [facility];  Service: General;  Laterality: N/A;  . COLECTOMY     2005, diverticulitis  . DILATION AND CURETTAGE OF UTERUS    . left breast      cancer, in 2006  . TUBAL LIGATION      Family History  Problem Relation Age of Onset  . Cancer Mother     breast  . COPD Father   . Throat cancer Sister   . Schizophrenia Brother   . Heart attack Brother   . Lung cancer Sister     former smoker    Social History Social History  Substance Use  Topics  . Smoking status: Current Every Day Smoker    Packs/day: 0.50    Years: 50.00    Types: Cigarettes  . Smokeless tobacco: Never Used     Comment: cigaretes  now 10 to 15 per day  . Alcohol use No    Allergies  Allergen Reactions  . Penicillins Hives, Shortness Of Breath and Swelling  . Sulfonamide Derivatives Other (See Comments)    Shaking all over, seizure like symptoms. Hospitalization resulted     Current Outpatient Prescriptions  Medication Sig Dispense Refill  . acetaminophen (TYLENOL) 500 MG tablet Take 500-1,000 mg by mouth every 6 (six) hours as needed for moderate pain.     Marland Kitchen albuterol (PROVENTIL) (2.5 MG/3ML) 0.083% nebulizer solution USE 1 VIAL IN NEBULIZER 3 TIMES DAILY. 300 mL 0  . amLODipine (NORVASC) 5 MG tablet TAKE 1 TABLET BY MOUTH ONCE A DAY. 90 tablet 1  . aspirin EC 81 MG tablet Take 81 mg by mouth daily.    . ATROVENT HFA 17 MCG/ACT inhaler INHALE 2 PUFFS BY MOUTH FOUR TIMES A DAY. 12.9 g 5  . azelastine (ASTELIN) 0.1 % nasal spray Place 2 sprays into both nostrils 2 (two) times daily. Use in each nostril as directed 30 mL 12  . benazepril (LOTENSIN) 40 MG tablet TAKE ONE TABLET  BY MOUTH DAILY. 90 tablet 0  . Calcium Carbonate-Vitamin D (CALCIUM 600 + D PO) Take 1 tablet by mouth 2 (two) times daily.     Marland Kitchen HYDROcodone-acetaminophen (NORCO/VICODIN) 5-325 MG tablet Take 1 tablet by mouth every 6 (six) hours as needed for moderate pain (Must last 14 days.Do not take and drive a car or use machinery.). 56 tablet 0  . ibuprofen (ADVIL,MOTRIN) 800 MG tablet Take 1 tablet (800 mg total) by mouth every 8 (eight) hours as needed. 90 tablet 0  . loratadine (CLARITIN) 10 MG tablet TAKE 1 TABLET BY MOUTH ONCE DAILY FOR ALLERGIES. 90 tablet 1  . montelukast (SINGULAIR) 10 MG tablet TAKE 1 TABLET BY MOUTH AT BEDTIME. 90 tablet 1  . Omega-3 Fatty Acids (FISH OIL) 1000 MG CAPS Take 2 capsules by mouth daily.    . polyethylene glycol powder (GLYCOLAX/MIRALAX) powder MIX  1 CAPFUL IN 8 OUNCES OF JUICE OR WATER AND DRINK ONCE DAILY. 527 g 3  . potassium chloride SA (K-DUR,KLOR-CON) 20 MEQ tablet Take 1 tablet (20 mEq total) by mouth daily. 90 tablet 0  . simvastatin (ZOCOR) 20 MG tablet TAKE (1) TABLET BY MOUTH DAILY AT 6 PM FOR CHOLESTEROL. 90 tablet 0  . SYMBICORT 160-4.5 MCG/ACT inhaler INHALE 2 PUFFS INTO THE LUNGS TWICE DAILY. 10.2 g 0  . tiZANidine (ZANAFLEX) 4 MG tablet TAKE ONE TABLET EVERY 8 HOURS AS NEEDED FOR MUSCLE SPASMS. 45 tablet 0   Current Facility-Administered Medications  Medication Dose Route Frequency Provider Last Rate Last Dose  . Influenza (>/= 3 years) inactive virus vaccine (FLVIRIN/FLUZONE) injection SUSP 0.5 mL  0.5 mL Intramuscular Once [name], MD         Physical Exam  Blood pressure (!) 148/87, pulse 84, temperature 97.5 F (36.4 C), height '5\' 3"'  (1.6 m), weight 163 lb (73.9 kg).  Constitutional: overall normal hygiene, normal nutrition, well developed, normal grooming, normal body habitus. Assistive device:none  Musculoskeletal: gait and station Limp none, muscle tone and strength are normal, no tremors or atrophy is present.  .  Neurological: coordination overall normal.  Deep tendon reflex/nerve stretch intact.  Sensation normal.  Cranial nerves II-XII intact.   Skin:   Normal overall no scars, lesions, ulcers or rashes. No psoriasis.  Psychiatric: Alert and oriented x 3.  Recent memory intact, remote memory unclear.  Normal mood and affect. Well groomed.  Good eye contact.  Cardiovascular: overall no swelling, no varicosities, no edema bilaterally, normal temperatures of the legs and arms, no clubbing, cyanosis and good capillary refill.  Lymphatic: palpation is normal.  Hips have full ROM and no pain. She has no limp today.  NV is intact.  The patient has been educated about the nature of the problem(s) and counseled on treatment options.  The patient appeared to understand what I have discussed and is  in agreement with it.  Encounter Diagnoses  Name Primary?  . Trochanteric bursitis of right hip Yes  . Cigarette nicotine dependence without complication     PLAN Call if any problems.  Precautions discussed.  Continue current medications.   Return to clinic 3 months   I have reviewed the [facility] web site prior to prescribing narcotic medicine for this patient.  Electronically Signed Sanjuana Kava, MD 1/30/20182:37 PM

## 2016-02-17 ENCOUNTER — Other Ambulatory Visit: Payer: Self-pay | Admitting: Family Medicine

## 2016-02-28 ENCOUNTER — Telehealth: Payer: Self-pay | Admitting: Orthopaedic Surgery

## 2016-03-14 ENCOUNTER — Other Ambulatory Visit: Payer: Self-pay | Admitting: Family Medicine

## 2016-03-14 DIAGNOSIS — J3089 Other allergic rhinitis: Secondary | ICD-10-CM

## 2016-03-19 ENCOUNTER — Telehealth: Payer: Self-pay | Admitting: Orthopaedic Surgery

## 2016-03-19 NOTE — Telephone Encounter (Signed)
Hydrocodone-Acetaminophen  5/325mg  Qty 56 Tablets °

## 2016-03-26 MED ORDER — HYDROCODONE-ACETAMINOPHEN 5-325 MG PO TABS: 1.0000 | ORAL_TABLET | Freq: Four times a day (QID) | ORAL | 0 refills | Status: DC | PRN

## 2016-03-27 MED ORDER — IBUPROFEN 800 MG PO TABS: 800.0000 mg | ORAL_TABLET | Freq: Three times a day (TID) | ORAL | 0 refills | Status: DC | PRN

## 2016-03-27 NOTE — Telephone Encounter (Signed)
Patient picked up prescription for pain medication as noted.  Called back to inquire if she may have a refill (through [facility])for medication:  ibuprofen (ADVIL,MOTRIN) 800 MG tablet 90 tablet   - Dr Aline Brochure may have prescribed during a time when Dr Luna Glasgow was out of office?

## 2016-03-27 NOTE — Telephone Encounter (Signed)
ROUTING TO DR Luna Glasgow

## 2016-04-09 ENCOUNTER — Telehealth: Payer: Self-pay | Admitting: Orthopaedic Surgery

## 2016-04-09 MED ORDER — HYDROCODONE-ACETAMINOPHEN 5-325 MG PO TABS: 1.0000 | ORAL_TABLET | Freq: Four times a day (QID) | ORAL | 0 refills | Status: DC | PRN

## 2016-04-09 NOTE — Telephone Encounter (Signed)
Patient requests refill:  HYDROcodone-acetaminophen (NORCO/VICODIN) 5-325 MG tablet 35 tablet

## 2016-04-10 ENCOUNTER — Encounter (HOSPITAL_COMMUNITY): Payer: Medicare Other | Attending: Oncology | Admitting: Oncology

## 2016-04-10 VITALS — BP 176/72 | HR 86 | Temp 98.3°F | Resp 18 | Ht 63.0 in | Wt 165.0 lb

## 2016-04-10 DIAGNOSIS — J00 Acute nasopharyngitis [common cold]: Secondary | ICD-10-CM

## 2016-04-10 DIAGNOSIS — C50212 Malignant neoplasm of upper-inner quadrant of left female breast: Secondary | ICD-10-CM

## 2016-04-10 DIAGNOSIS — Z170 Estrogen receptor positive status [ER+]: Secondary | ICD-10-CM | POA: Diagnosis not present

## 2016-04-10 DIAGNOSIS — R093 Abnormal sputum: Secondary | ICD-10-CM | POA: Diagnosis not present

## 2016-04-10 MED ORDER — AZITHROMYCIN 250 MG PO TABS: ORAL_TABLET | ORAL | 0 refills | Status: AC

## 2016-04-10 NOTE — Progress Notes (Signed)
Tanya Nakayama, MD [address], [address] [place] [address]  Malignant neoplasm of upper-inner quadrant of left breast in female, estrogen receptor positive ([place])  Acute nasopharyngitis - Plan: azithromycin (ZITHROMAX) 250 MG tablet  CURRENT THERAPY: Surveillance per NCCN guidelines  INTERVAL HISTORY: [name] 71 y.o. female returns for followup of Stage I (T1b N0 M0), grade 1 well-differentiated carcinoma of the left breast status post lumpectomy followed by radiation therapy. Her estrogen receptor receptors were 93%, progesterone receptors 67%. HER-2/neu was negative. No lymphovascular invasion was seen. All margins were clear. Ki-67 marker was low at 1% with surgery on 11/15/2004. Treated then with post-lumpectomy radiation, finished as of 02/22/2005, followed by Aromasin beginning on 12/11/2004, which she could not tolerate. We then switched her to Femara, which she finished all therapy as of January 14, 2010.    Breast cancer ([name])   11/15/2004 Surgery    Lumpectomy       - 02/22/2005 Radiation Therapy         12/11/2004 -  Chemotherapy    Aromasin       Adverse Reaction    Intolerance to Aromasin       - 01/14/2010 Chemotherapy    Femara       She reports sputum production that is thick yellow.  She denies any fevers, but notes chills.  She denies any sick contacts.    She reports a recent rib injury on the left.  If persistent, Xray imaging would be indicated.  Ct imaging in Jan 2018 was negative for any new osseous issues concerning for recurrence of malignancy.   She denies any new breast abnormalities on her self-breast exams.  She continues to smoke cigarettes and is working on cessation.  She otherwise denies any new oncology complaints.  Review of Systems  Constitutional: Negative.  Negative for chills, fever and weight loss.  HENT: Negative.   Eyes: Negative.   Respiratory: Negative.  Negative for cough.   Cardiovascular:  Negative.  Negative for chest pain.  Gastrointestinal: Negative.  Negative for blood in stool, constipation, diarrhea, melena, nausea and vomiting.  Genitourinary: Negative.   Musculoskeletal: Negative.        Right thorax pain  Skin: Negative.   Neurological: Negative.  Negative for weakness.  Endo/Heme/Allergies: Negative.   Psychiatric/Behavioral: Negative.      Past Medical History:  Diagnosis Date  . Breast cancer ([name]) 03/11/2012   Stage I (T1b N0 M0), grade 1 well-differentiated carcinoma of the left breast status, post lumpectomy followed by radiation therapy. Her estrogen receptor receptors were 93%, progesterone receptors 67%. HER-2/neu was negative. No lymphovascular space invasion was seen. All margins were clear. Ki-67 marker was low at 1% with surgery on 11/15/2004. Treated then with post-lumpectomy radiation, finish  . Breast cancer, left breast ([place])   . COPD (chronic obstructive pulmonary disease) ([place])   . Diabetes mellitus without complication ([name])   . Diverticula of colon   . Hyperlipidemia   . Hypertension   . Kidney stones   . Nicotine dependence   . Osteoarthritis     has Hyperlipidemia LDL goal <100; NICOTINE ADDICTION; Hypertension goal BP (blood pressure) < 130/80; COPD (chronic obstructive pulmonary disease) with chronic bronchitis ([place]); CONSTIPATION, CHRONIC; GENERALIZED OSTEOARTHROSIS UNSPECIFIED SITE; Ganglion cyst; Diabetes mellitus type 2, diet-controlled ([name]); Breast cancer ([name]); Pulmonary nodules; postmenopausal ovarian cyst; Osteopenia; Annual physical exam; Neoplasm of uncertain behavior of ovary; Hydrosalpinx; 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H[facility]); and Allergic rhinitis on  her problem list.     is allergic to penicillins and sulfonamide derivatives.  Ms. Grumbine had no medications administered during this visit.  Past Surgical History:  Procedure Laterality Date  . ABDOMINAL HYSTERECTOMY    . BREAST SURGERY  2005 approx   left lumpectomy   . CHOLECYSTECTOMY  N/A 05/26/2014   Procedure: LAPAROSCOPIC CHOLECYSTECTOMY;  Surgeon: Aviva Signs Md, MD;  Location: [facility];  Service: General;  Laterality: N/A;  . COLECTOMY     2005, diverticulitis  . DILATION AND CURETTAGE OF UTERUS    . left breast      cancer, in 2006  . TUBAL LIGATION      PHYSICAL EXAMINATION  ECOG PERFORMANCE STATUS: 0 - Asymptomatic  Vitals:   04/10/16 1100  BP: (!) 176/72  Pulse: 86  Resp: 18  Temp: 98.3 F (36.8 C)    GENERAL:alert, no distress, well nourished, well developed, comfortable, cooperative and smiling, unaccompanied, smelling of cigarette smoke. SKIN: skin color, texture, turgor are normal, no rashes or significant lesions HEAD: Normocephalic, No masses, lesions, tenderness or abnormalities EYES: normal, PERRLA, EOMI, Conjunctiva are pink and non-injected EARS: External ears normal OROPHARYNX:lips, buccal mucosa, and tongue normal and mucous membranes are moist, upper dentures in place. NECK: supple, thyroid normal size, non-tender, without nodularity, no stridor, non-tender, trachea midline LYMPH:  no palpable lymphadenopathy, no hepatosplenomegaly BREAST:right breast normal without mass, skin or nipple changes or axillary nodes, left lumpectomy site well healed and free of suspicious changes or abnormalities on palpation.  Some fibrotic thickening at lumpectomy site. LUNGS: clear to auscultation and percussion without any wheezes, rales, or rhonchi.  Decreased breath sounds bilaterally throughout. HEART: regular rate & rhythm, no murmurs, no gallops, S1 normal and S2 normal ABDOMEN:abdomen soft, non-tender, normal bowel sounds, no masses or organomegaly and no hepatosplenomegaly BACK: Back symmetric, no curvature., No CVA tenderness EXTREMITIES:less then 2 second capillary refill, no joint deformities, effusion, or inflammation, no edema, no skin discoloration, no clubbing, no cyanosis  NEURO: alert & oriented x 3 with fluent speech, no focal motor/sensory  deficits, gait normal   LABORATORY DATA: CBC    Component Value Date/Time   WBC 9.3 08/25/2015 1026   RBC 4.58 08/25/2015 1026   HGB 13.5 08/25/2015 1026   HCT 41.6 08/25/2015 1026   PLT 431 (H) 08/25/2015 1026   MCV 90.8 08/25/2015 1026   MCH 29.5 08/25/2015 1026   MCHC 32.5 08/25/2015 1026   RDW 13.4 08/25/2015 1026   LYMPHSABS 2.7 05/21/2014 1327   MONOABS 1.2 (H) 05/21/2014 1327   EOSABS 0.3 05/21/2014 1327   BASOSABS 0.1 05/21/2014 1327      Chemistry      Component Value Date/Time   NA 142 01/18/2016 1258   K 4.4 01/18/2016 1258   CL 105 01/18/2016 1258   CO2 23 01/18/2016 1258   BUN 14 01/18/2016 1258   CREATININE 0.63 01/18/2016 1258      Component Value Date/Time   CALCIUM 9.7 01/18/2016 1258   ALKPHOS 70 01/18/2016 1258   AST 16 01/18/2016 1258   ALT 12 01/18/2016 1258   BILITOT 0.4 01/18/2016 1258        RADIOGRAPHIC STUDIES:  CLINICAL DATA:  Screening.  EXAM: 2D DIGITAL SCREENING BILATERAL MAMMOGRAM WITH CAD AND ADJUNCT TOMO  COMPARISON:  Previous exam(s).  ACR Breast Density Category b: There are scattered areas of fibroglandular density.  FINDINGS: There are no findings suspicious for malignancy. Images were processed with CAD.  IMPRESSION: No mammographic evidence  of malignancy. A result letter of this screening mammogram will be mailed directly to the patient.  RECOMMENDATION: Screening mammogram in one year. (Code:SM-B-01Y)  BI-RADS CATEGORY  1: Negative.   Electronically Signed   By: Ammie Ferrier M.D.   On: 02/09/2016 15:39   ASSESSMENT AND PLAN:  Breast cancer Stage I (T1b N0 M0), grade 1 well-differentiated carcinoma of the left breast status post lumpectomy followed by radiation therapy. Her estrogen receptor receptors were 93%, progesterone receptors 67%. HER-2/neu was negative. No lymphovascular invasion was seen. All margins were clear. Ki-67 marker was low at 1% with surgery on 11/15/2004. Treated then  with post-lumpectomy radiation, finished as of 02/22/2005, followed by Aromasin beginning on 12/11/2004, which she could not tolerate. We then switched her to Femara, which she finished all therapy as of January 14, 2010.    No role for oncology labs today.   I personally reviewed and went over radiographic studies with the patient.  The results are noted within this dictation.  Mammogram in Jan 2018 was NEGATIVE.  She will be due for her next screening mammogram in Jan 2019.    She reports URI symptoms.  Rx for Z-PAK is escribed.  If ineffective, she will follow-up with Dr. Moshe Cipro.  She will return in 12 months for follow-up and I am pleased to see her back.  She wishes to return here on an annual basis for her breast exam.  Smoking cessation education is provided today.  She smokes 8 cigarettes ppd presently.  She is going to try nicorette to help quit smoking.  Dr. Moshe Cipro has performed lung cancer screening which was negative in 2016 and 2018.  THERAPY PLAN:  NCCN guidelines recommends the following surveillance for invasive breast cancer (2.2016):  A. History and Physical exam 1-4 times per year as clinically appropriate for 5 years, then annually.  B. Periodic screening for changes in family history and referral to genetics counseling as indicated  C. Educate, monitor, and refer to lymphedema management.  D. Mammography every 12 months  E. Routine imaging of reconstructed breast is not indicated.  F. In the absence of clinical signs and symptoms suggestive of recurrent disease, there is no indication for laboratory or imaging studies for metastases screening.  G. Women on Tamoxifen: annual gynecologic assessment every 12 months if uterus is present.  H. Women on aromatase inhibitor or who experience ovarian failure secondary to treatment should have monitoring of bone health with a bone mineral density determination at baseline and periodically thereafter.  I. Assess and encourage  adherence to adjuvant endocrine therapy.  J. Evidence suggests that active lifestyle, healthy diet, limited alcohol intake, and achieving and maintaining an ideal body weight (20-25 BMI) may lead to optimal breast cancer outcomes.   All questions were answered. The patient knows to call the clinic with any problems, questions or concerns. We can certainly see the patient much sooner if necessary.  Patient and plan discussed with Dr. Twana First and she is in agreement with the aforementioned.   This note is electronically signed by: Robynn Pane 04/10/2016 12:28 PM

## 2016-04-10 NOTE — Patient Instructions (Signed)
[place] at [facility] Discharge Instructions  RECOMMENDATIONS MADE BY THE CONSULTANT AND ANY TEST RESULTS WILL BE SENT TO YOUR REFERRING PHYSICIAN.  You were seen today by Kirby Crigler PA-C. Zpak sent to your pharmacy. Follow up in 1 year.   Thank you for choosing [facility] to provide your oncology and hematology care.  To afford each patient quality time with our provider, please arrive at least 15 minutes before your scheduled appointment time.    If you have a lab appointment with the [facility] please come in thru the  Main Entrance and check in at the main information desk  You need to re-schedule your appointment should you arrive 10 or more minutes late.  We strive to give you quality time with our providers, and arriving late affects you and other patients whose appointments are after yours.  Also, if you no show three or more times for appointments you may be dismissed from the clinic at the providers discretion.     Again, thank you for choosing [facility].  Our hope is that these requests will decrease the amount of time that you wait before being seen by our physicians.       _____________________________________________________________  Should you have questions after your visit to [facility], please contact our office at [phone] between the hours of 8:30 a.m. and 4:30 p.m.  Voicemails left after 4:30 p.m. will not be returned until the following business day.  For prescription refill requests, have your pharmacy contact our office.       Resources For Cancer Patients and their Caregivers ? American Cancer Society: Can assist with transportation, wigs, general needs, runs Look Good Feel Better.        (539)840-3362 ? Cancer Care: Provides financial assistance, online support groups, medication/co-pay assistance.  1-800-813-HOPE 830-047-0146) ? [facility] Assists [place] cancer patients and their families through emotional , educational and financial support.  (806)452-3775 ? [place] DSS Where to apply for food stamps, Medicaid and utility assistance. 980-643-3536 ? [facility]: Transportation to medical appointments. (724)146-3136 ? Social Security Administration: May apply for disability if have a Stage IV cancer. 7061286579 614-033-7292 ? [employer], Disability and Transit Services: Assists with nutrition, care and transit needs. [place] Support Programs: @10RELATIVEDAYS @ > Cancer Support Group  2nd Tuesday of the month 1pm-2pm, Journey Room  > Creative Journey  3rd Tuesday of the month 1130am-1pm, Journey Room  > Look Good Feel Better  1st Wednesday of the month 10am-12 noon, Journey Room (Call [place] to register [phone])

## 2016-04-10 NOTE — Assessment & Plan Note (Addendum)
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 Her estrogen receptor receptors were 93%, progesterone receptors 67%. HER-2/neu was negative. No lymphovascular invasion was seen. All margins were clear. Ki-67 marker was low at 1% with surgery on 11/15/2004. Treated then with post-lumpectomy radiation, finished as of 02/22/2005, followed by Aromasin beginning on 12/11/2004, which she could not tolerate. We then switched her to Femara, which she finished all therapy as of January 14, 2010.    No role for oncology labs today.   I personally reviewed and went over radiographic studies with the patient.  The results are noted within this dictation.  Mammogram in Jan 2018 was NEGATIVE.  She will be due for her next screening mammogram in Jan 2019.    She reports URI symptoms.  Rx for Z-PAK is escribed.  If ineffective, she will follow-up with Dr. Moshe Cipro.  She will return in 12 months for follow-up and I am pleased to see her back.  She wishes to return here on an annual basis for her breast exam.  Smoking cessation education is provided today.  She smokes 8 cigarettes ppd presently.  She is going to try nicorette to help quit smoking.  Dr. Moshe Cipro has performed lung cancer screening which was negative in 2016 and 2018.

## 2016-04-23 ENCOUNTER — Other Ambulatory Visit: Payer: Self-pay | Admitting: Family Medicine

## 2016-04-26 ENCOUNTER — Telehealth: Payer: Self-pay | Admitting: Orthopaedic Surgery

## 2016-04-26 MED ORDER — HYDROCODONE-ACETAMINOPHEN 5-325 MG PO TABS: 1.0000 | ORAL_TABLET | Freq: Four times a day (QID) | ORAL | 0 refills | Status: DC | PRN

## 2016-04-26 NOTE — Telephone Encounter (Signed)
Patient called and requested a refill on Hydrocodone/Acetaminophen 5-325  mgs.   Qty  25       Sig: Take 1 tablet by mouth every 6 (six) hours as needed for moderate pain.

## 2016-05-10 ENCOUNTER — Ambulatory Visit (INDEPENDENT_AMBULATORY_CARE_PROVIDER_SITE_OTHER): Payer: Medicare Other | Admitting: Orthopaedic Surgery

## 2016-05-10 ENCOUNTER — Encounter: Payer: Self-pay | Admitting: Orthopaedic Surgery

## 2016-05-10 VITALS — BP 153/85 | HR 95 | Temp 98.8°F | Ht 63.0 in | Wt 161.4 lb

## 2016-05-10 DIAGNOSIS — M7061 Trochanteric bursitis, right hip: Secondary | ICD-10-CM

## 2016-05-10 DIAGNOSIS — F17210 Nicotine dependence, cigarettes, uncomplicated: Secondary | ICD-10-CM

## 2016-05-10 MED ORDER — HYDROCODONE-ACETAMINOPHEN 5-325 MG PO TABS: 1.0000 | ORAL_TABLET | Freq: Four times a day (QID) | ORAL | 0 refills | Status: DC | PRN

## 2016-05-10 NOTE — Progress Notes (Signed)
Patient [name], female DOB:12/25/1945, 71 y.o. IOX:[id number]  Chief Complaint  Patient presents with  . Follow-up    right hip    HPI  [name] is a 71 y.o. female who has chronic right hip trochanteric pain.  She has no swelling, no redness, no trauma.  She is active.  She is taking her medicine. HPI  Body mass index is 28.59 kg/m.  ROS  Review of Systems  HENT: Negative for congestion.   Respiratory: Positive for shortness of breath. Negative for cough.   Cardiovascular: Negative for chest pain and leg swelling.  Endocrine: Positive for cold intolerance.  Musculoskeletal: Positive for arthralgias, gait problem, joint swelling and myalgias.  Allergic/Immunologic: Positive for environmental allergies.    Past Medical History:  Diagnosis Date  . Breast cancer ([place]) 03/11/2012   Stage I (T1b N0 M0), grade 1 well-differentiated carcinoma of the left breast status, post lumpectomy followed by radiation therapy. Her estrogen receptor receptors were 93%, progesterone receptors 67%. HER-2/neu was negative. No lymphovascular space invasion was seen. All margins were clear. Ki-67 marker was low at 1% with surgery on 11/15/2004. Treated then with post-lumpectomy radiation, finish  . Breast cancer, left breast ([place])   . COPD (chronic obstructive pulmonary disease) ([place])   . Diabetes mellitus without complication ([place])   . Diverticula of colon   . Hyperlipidemia   . Hypertension   . Kidney stones   . Nicotine dependence   . Osteoarthritis     Past Surgical History:  Procedure Laterality Date  . ABDOMINAL HYSTERECTOMY    . BREAST SURGERY  2005 approx   left lumpectomy   . CHOLECYSTECTOMY N/A 05/26/2014   Procedure: LAPAROSCOPIC CHOLECYSTECTOMY;  Surgeon: Aviva Signs Md, MD;  Location: [facility];  Service: General;  Laterality: N/A;  . COLECTOMY     2005, diverticulitis  . DILATION AND CURETTAGE OF UTERUS    . left breast      cancer, in 2006  . TUBAL LIGATION       Family History  Problem Relation Age of Onset  . Cancer Mother     breast  . COPD Father   . Throat cancer Sister   . Schizophrenia Brother   . Heart attack Brother   . Lung cancer Sister     former smoker    Social History Social History  Substance Use Topics  . Smoking status: Current Every Day Smoker    Packs/day: 0.50    Years: 50.00    Types: Cigarettes  . Smokeless tobacco: Never Used     Comment: cigaretes  now 10 to 15 per day  . Alcohol use No    Allergies  Allergen Reactions  . Penicillins Hives, Shortness Of Breath and Swelling  . Sulfonamide Derivatives Other (See Comments)    Shaking all over, seizure like symptoms. Hospitalization resulted     Current Outpatient Prescriptions  Medication Sig Dispense Refill  . acetaminophen (TYLENOL) 500 MG tablet Take 500-1,000 mg by mouth every 6 (six) hours as needed for moderate pain.     Marland Kitchen albuterol (PROVENTIL) (2.5 MG/3ML) 0.083% nebulizer solution USE 1 VIAL IN NEBULIZER 3 TIMES DAILY. 300 mL 0  . amLODipine (NORVASC) 5 MG tablet TAKE 1 TABLET BY MOUTH ONCE A DAY. 90 tablet 1  . aspirin EC 81 MG tablet Take 81 mg by mouth daily.    . ATROVENT HFA 17 MCG/ACT inhaler INHALE 2 PUFFS BY MOUTH FOUR TIMES A DAY. 12.9 g 5  .  azelastine (ASTELIN) 0.1 % nasal spray PLACE 2 SPRAYS INTO EACH NOSTRIL TWICE DAILY. 30 mL 5  . benazepril (LOTENSIN) 40 MG tablet TAKE ONE TABLET BY MOUTH DAILY. 90 tablet 0  . Calcium Carbonate-Vitamin D (CALCIUM 600 + D PO) Take 1 tablet by mouth 2 (two) times daily.     Marland Kitchen HYDROcodone-acetaminophen (NORCO/VICODIN) 5-325 MG tablet Take 1 tablet by mouth every 6 (six) hours as needed for moderate pain. 35 tablet 0  . ibuprofen (ADVIL,MOTRIN) 800 MG tablet Take 1 tablet (800 mg total) by mouth every 8 (eight) hours as needed. 90 tablet 0  . loratadine (CLARITIN) 10 MG tablet TAKE 1 TABLET BY MOUTH ONCE DAILY FOR ALLERGIES. 90 tablet 1  . montelukast (SINGULAIR) 10 MG tablet TAKE 1 TABLET BY MOUTH AT  BEDTIME. 90 tablet 1  . Omega-3 Fatty Acids (FISH OIL) 1000 MG CAPS Take 2 capsules by mouth daily.    . polyethylene glycol powder (GLYCOLAX/MIRALAX) powder MIX 1 CAPFUL IN 8 OUNCES OF JUICE OR WATER AND DRINK ONCE DAILY. 527 g 5  . potassium chloride SA (K-DUR,KLOR-CON) 20 MEQ tablet Take 1 tablet (20 mEq total) by mouth daily. 90 tablet 0  . simvastatin (ZOCOR) 20 MG tablet TAKE (1) TABLET BY MOUTH DAILY AT 6 PM FOR CHOLESTEROL. 90 tablet 1  . SYMBICORT 160-4.5 MCG/ACT inhaler INHALE 2 PUFFS INTO THE LUNGS TWICE DAILY. 10.2 g 5  . tiZANidine (ZANAFLEX) 4 MG tablet TAKE ONE TABLET EVERY 8 HOURS AS NEEDED FOR MUSCLE SPASMS. 45 tablet 0   Current Facility-Administered Medications  Medication Dose Route Frequency Provider Last Rate Last Dose  . Influenza (>/= 3 years) inactive virus vaccine (FLVIRIN/FLUZONE) injection SUSP 0.5 mL  0.5 mL Intramuscular Once [name], MD         Physical Exam  Blood pressure (!) 153/85, pulse 95, temperature 98.8 F (37.1 C), height _0  (1.6 m), weight 161 lb 6.4 oz (73.2 kg).  Constitutional: overall normal hygiene, normal nutrition, well developed, normal grooming, normal body habitus. Assistive device:none  Musculoskeletal: gait and station Limp none, muscle tone and strength are normal, no tremors or atrophy is present.  .  Neurological: coordination overall normal.  Deep tendon reflex/nerve stretch intact.  Sensation normal.  Cranial nerves II-XII intact.   Skin:   Normal overall no scars, lesions, ulcers or rashes. No psoriasis.  Psychiatric: Alert and oriented x 3.  Recent memory intact, remote memory unclear.  Normal mood and affect. Well groomed.  Good eye contact.  Cardiovascular: overall no swelling, no varicosities, no edema bilaterally, normal temperatures of the legs and arms, no clubbing, cyanosis and good capillary refill.  Lymphatic: palpation is normal.  Her right hip trochanteric area is tender.  She has full ROM of the  right hip.  She has no swelling or redness.  NV intact.  The patient has been educated about the nature of the problem(s) and counseled on treatment options.  The patient appeared to understand what I have discussed and is in agreement with it.  Encounter Diagnoses  Name Primary?  . Trochanteric bursitis of right hip Yes  . Cigarette nicotine dependence without complication     PLAN Call if any problems.  Precautions discussed.  Continue current medications.   Return to clinic 3 months   I have reviewed the [facility] web site prior to prescribing narcotic medicine for this patient.  Electronically Signed Sanjuana Kava, MD 4/26/20181:53 PM

## 2016-05-10 NOTE — Patient Instructions (Signed)
Steps to Quit Smoking Smoking tobacco can be bad for your health. It can also affect almost every organ in your body. Smoking puts you and people around you at risk for many serious long-lasting (chronic) diseases. Quitting smoking is hard, but it is one of the best things that you can do for your health. It is never too late to quit. What are the benefits of quitting smoking? When you quit smoking, you lower your risk for getting serious diseases and conditions. They can include:  Lung cancer or lung disease.  Heart disease.  Stroke.  Heart attack.  Not being able to have children (infertility).  Weak bones (osteoporosis) and broken bones (fractures). If you have coughing, wheezing, and shortness of breath, those symptoms may get better when you quit. You may also get sick less often. If you are pregnant, quitting smoking can help to lower your chances of having a baby of low birth weight. What can I do to help me quit smoking? Talk with your doctor about what can help you quit smoking. Some things you can do (strategies) include:  Quitting smoking totally, instead of slowly cutting back how much you smoke over a period of time.  Going to in-person counseling. You are more likely to quit if you go to many counseling sessions.  Using resources and support systems, such as:  Online chats with a counselor.  Phone quitlines.  Printed self-help materials.  Support groups or group counseling.  Text messaging programs.  Mobile phone apps or applications.  Taking medicines. Some of these medicines may have nicotine in them. If you are pregnant or breastfeeding, do not take any medicines to quit smoking unless your doctor says it is okay. Talk with your doctor about counseling or other things that can help you. Talk with your doctor about using more than one strategy at the same time, such as taking medicines while you are also going to in-person counseling. This can help make quitting  easier. What things can I do to make it easier to quit? Quitting smoking might feel very hard at first, but there is a lot that you can do to make it easier. Take these steps:  Talk to your family and friends. Ask them to support and encourage you.  Call phone quitlines, reach out to support groups, or work with a counselor.  Ask people who smoke to not smoke around you.  Avoid places that make you want (trigger) to smoke, such as:  Bars.  Parties.  Smoke-break areas at work.  Spend time with people who do not smoke.  Lower the stress in your life. Stress can make you want to smoke. Try these things to help your stress:  Getting regular exercise.  Deep-breathing exercises.  Yoga.  Meditating.  Doing a body scan. To do this, close your eyes, focus on one area of your body at a time from head to toe, and notice which parts of your body are tense. Try to relax the muscles in those areas.  Download or buy apps on your mobile phone or tablet that can help you stick to your quit plan. There are many free apps, such as QuitGuide from the CDC (Centers for Disease Control and Prevention). You can find more support from smokefree.gov and other websites. This information is not intended to replace advice given to you by your health care provider. Make sure you discuss any questions you have with your health care provider. Document Released: 10/28/2008 Document Revised: 08/30/2015 Document   Reviewed: 05/18/2014 Elsevier Interactive Patient Education  2017 Elsevier Inc.  

## 2016-05-14 ENCOUNTER — Other Ambulatory Visit: Payer: Self-pay | Admitting: Family Medicine

## 2016-05-17 ENCOUNTER — Encounter (HOSPITAL_COMMUNITY): Payer: Self-pay | Admitting: Emergency Medicine

## 2016-05-17 ENCOUNTER — Emergency Department (HOSPITAL_COMMUNITY): Payer: Medicare Other

## 2016-05-17 ENCOUNTER — Emergency Department (HOSPITAL_COMMUNITY)
Admission: EM | Admit: 2016-05-17 | Discharge: 2016-05-17 | Disposition: A | Discharge status: Home / Self Care | Payer: Medicare Other | Attending: Emergency Medicine | Admitting: Emergency Medicine

## 2016-05-17 DIAGNOSIS — Y92481 Parking lot as the place of occurrence of the external cause: Secondary | ICD-10-CM | POA: Insufficient documentation

## 2016-05-17 DIAGNOSIS — S8991XA Unspecified injury of right lower leg, initial encounter: Secondary | ICD-10-CM | POA: Diagnosis not present

## 2016-05-17 DIAGNOSIS — F17210 Nicotine dependence, cigarettes, uncomplicated: Secondary | ICD-10-CM | POA: Diagnosis not present

## 2016-05-17 DIAGNOSIS — Z7982 Long term (current) use of aspirin: Secondary | ICD-10-CM | POA: Diagnosis not present

## 2016-05-17 DIAGNOSIS — Y9301 Activity, walking, marching and hiking: Secondary | ICD-10-CM | POA: Diagnosis not present

## 2016-05-17 DIAGNOSIS — J449 Chronic obstructive pulmonary disease, unspecified: Secondary | ICD-10-CM | POA: Insufficient documentation

## 2016-05-17 DIAGNOSIS — E119 Type 2 diabetes mellitus without complications: Secondary | ICD-10-CM | POA: Diagnosis not present

## 2016-05-17 DIAGNOSIS — Z853 Personal history of malignant neoplasm of breast: Secondary | ICD-10-CM | POA: Insufficient documentation

## 2016-05-17 DIAGNOSIS — S7001XA Contusion of right hip, initial encounter: Secondary | ICD-10-CM | POA: Insufficient documentation

## 2016-05-17 DIAGNOSIS — R05 Cough: Secondary | ICD-10-CM | POA: Diagnosis not present

## 2016-05-17 DIAGNOSIS — Z79899 Other long term (current) drug therapy: Secondary | ICD-10-CM | POA: Insufficient documentation

## 2016-05-17 DIAGNOSIS — Y999 Unspecified external cause status: Secondary | ICD-10-CM | POA: Diagnosis not present

## 2016-05-17 DIAGNOSIS — I10 Essential (primary) hypertension: Secondary | ICD-10-CM | POA: Insufficient documentation

## 2016-05-17 DIAGNOSIS — J40 Bronchitis, not specified as acute or chronic: Secondary | ICD-10-CM | POA: Diagnosis not present

## 2016-05-17 DIAGNOSIS — S80211A Abrasion, right knee, initial encounter: Secondary | ICD-10-CM | POA: Insufficient documentation

## 2016-05-17 DIAGNOSIS — M25561 Pain in right knee: Secondary | ICD-10-CM | POA: Diagnosis not present

## 2016-05-17 DIAGNOSIS — W19XXXA Unspecified fall, initial encounter: Secondary | ICD-10-CM

## 2016-05-17 DIAGNOSIS — W01198A Fall on same level from slipping, tripping and stumbling with subsequent striking against other object, initial encounter: Secondary | ICD-10-CM | POA: Insufficient documentation

## 2016-05-17 DIAGNOSIS — S79911A Unspecified injury of right hip, initial encounter: Secondary | ICD-10-CM | POA: Diagnosis present

## 2016-05-17 DIAGNOSIS — M25551 Pain in right hip: Secondary | ICD-10-CM | POA: Diagnosis not present

## 2016-05-17 MED ORDER — BACITRACIN ZINC 500 UNIT/GM EX OINT
TOPICAL_OINTMENT | CUTANEOUS | Status: AC
  Administered 2016-05-17: 1
  Filled 2016-05-17: qty 0.9

## 2016-05-17 MED ORDER — AZITHROMYCIN 250 MG PO TABS: 250.0000 mg | ORAL_TABLET | Freq: Every day | ORAL | 0 refills | Status: DC

## 2016-05-17 MED ORDER — IPRATROPIUM-ALBUTEROL 0.5-2.5 (3) MG/3ML IN SOLN
3.0000 mL | Freq: Once | RESPIRATORY_TRACT | Status: AC
  Administered 2016-05-17: 3 mL via RESPIRATORY_TRACT
  Filled 2016-05-17: qty 3

## 2016-05-17 MED ORDER — PREDNISONE 10 MG PO TABS: ORAL_TABLET | ORAL | 0 refills | Status: DC

## 2016-05-17 NOTE — ED Notes (Signed)
Patient transported to X-ray 

## 2016-05-17 NOTE — ED Triage Notes (Signed)
PT states she was walking out of her pharmacy and tripped over a parking column in the parking lot and went down onto right knee. Abrasion noted to right knee with pain and lower back pain. PT ambulatory in triage.

## 2016-05-17 NOTE — ED Notes (Signed)
Pt ambulated to room from triage.  

## 2016-05-17 NOTE — Discharge Instructions (Signed)
Return if any problems.  Ice to area of pain  °

## 2016-05-18 NOTE — ED Provider Notes (Signed)
[facility] DEPT Provider Note   CSN: [id number] Arrival date & time: 05/17/16  1615     History   Chief Complaint Chief Complaint  Patient presents with  . Fall    HPI [name] is a 71 y.o. female.  The history is provided by the patient. No language interpreter was used.  Fall  This is a new problem. The problem occurs constantly. The problem has been gradually worsening. Nothing aggravates the symptoms. Nothing relieves the symptoms. She has tried nothing for the symptoms. The treatment provided no relief.  Pt complains of falling at pharmacy and hitting her right knee and her right hip. Pt also complains of copd.  Pt reports she thinks she has bronchitis. Pt requesting breathing treatment and antibiotic.  Past Medical History:  Diagnosis Date  . Breast cancer ([place]) 03/11/2012   Stage I (T1b N0 M0), grade 1 well-differentiated carcinoma of the left breast status, post lumpectomy followed by radiation therapy. Her estrogen receptor receptors were 93%, progesterone receptors 67%. HER-2/neu was negative. No lymphovascular space invasion was seen. All margins were clear. Ki-67 marker was low at 1% with surgery on 11/15/2004. Treated then with post-lumpectomy radiation, finish  . Breast cancer, left breast ([place])   . COPD (chronic obstructive pulmonary disease) ([employer])   . Diabetes mellitus without complication ([place])   . Diverticula of colon   . Hyperlipidemia   . Hypertension   . Kidney stones   . Nicotine dependence   . Osteoarthritis     Patient Active Problem List   Diagnosis Date Noted  . Allergic rhinitis 01/04/2015  . 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[place]) 09/26/2014  . Hydrosalpinx 09/28/2013  . 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 Annual physical exam 09/17/2013  . Osteopenia 07/15/2013  . postmenopausal ovarian cyst 04/06/2013  . Pulmonary nodules 04/04/2012  . Breast cancer ([place]) 03/11/2012  . Diabetes mellitus type 2, diet-controlled ([place]) 09/13/2011  .  Ganglion cyst 05/08/2011  . COPD (chronic obstructive pulmonary disease) with chronic bronchitis ([place]) 07/24/2009  . Hyperlipidemia LDL goal <100 03/09/2009  . CONSTIPATION, CHRONIC 03/09/2009  . NICOTINE ADDICTION 10/10/2008  . Hypertension goal BP (blood pressure) < 130/80 09/29/2008  . GENERALIZED OSTEOARTHROSIS UNSPECIFIED SITE 09/29/2008    Past Surgical History:  Procedure Laterality Date  . ABDOMINAL HYSTERECTOMY    . BREAST SURGERY  2005 approx   left lumpectomy   . CHOLECYSTECTOMY N/A 05/26/2014   Procedure: LAPAROSCOPIC CHOLECYSTECTOMY;  Surgeon: Aviva Signs Md, MD;  Location: [facility];  Service: General;  Laterality: N/A;  . COLECTOMY     2005, diverticulitis  . DILATION AND CURETTAGE OF UTERUS    . left breast      cancer, in 2006  . TUBAL LIGATION      OB History    Gravida Para Term Preterm AB Living   _0 SAB TAB Ectopic Multiple Live Births   3               Home Medications    Prior to Admission medications   Medication Sig Start Date End Date Taking? Authorizing Provider  acetaminophen (TYLENOL) 500 MG tablet Take 500-1,000 mg by mouth every 6 (six) hours as needed for moderate pain.     Historical Provider, MD  albuterol (PROVENTIL) (2.5 MG/3ML) 0.083% nebulizer solution USE 1 VIAL IN NEBULIZER 3 TIMES DAILY. 08/11/13   [name], MD  amLODipine (NORVASC) 5 MG tablet TAKE 1 TABLET BY MOUTH  ONCE A DAY. 02/06/16   [name], MD  aspirin EC 81 MG tablet Take 81 mg by mouth daily.    Historical Provider, MD  ATROVENT HFA 17 MCG/ACT inhaler INHALE 2 PUFFS BY MOUTH FOUR TIMES A DAY. 02/13/16   [name], MD  azelastine (ASTELIN) 0.1 % nasal spray PLACE 2 SPRAYS INTO EACH NOSTRIL TWICE DAILY. 03/14/16   [name], MD  azithromycin (ZITHROMAX) 250 MG tablet Take 1 tablet (250 mg total) by mouth daily. Take first 2 tablets together, then 1 every day until finished. 05/17/16   Fransico Meadow, PA-C  benazepril (LOTENSIN) 40 MG  tablet TAKE ONE TABLET BY MOUTH DAILY. 05/14/16   [name], MD  Calcium Carbonate-Vitamin D (CALCIUM 600 + D PO) Take 1 tablet by mouth 2 (two) times daily.     Historical Provider, MD  HYDROcodone-acetaminophen (NORCO/VICODIN) 5-325 MG tablet Take 1 tablet by mouth every 6 (six) hours as needed for moderate pain. 05/10/16   Sanjuana Kava, MD  ibuprofen (ADVIL,MOTRIN) 800 MG tablet Take 1 tablet (800 mg total) by mouth every 8 (eight) hours as needed. 03/27/16   Sanjuana Kava, MD  loratadine (CLARITIN) 10 MG tablet TAKE 1 TABLET BY MOUTH ONCE DAILY FOR ALLERGIES. 01/02/16   [name], MD  montelukast (SINGULAIR) 10 MG tablet TAKE 1 TABLET BY MOUTH AT BEDTIME. 01/24/16   [name], MD  Omega-3 Fatty Acids (FISH OIL) 1000 MG CAPS Take 2 capsules by mouth daily.    Historical Provider, MD  polyethylene glycol powder (GLYCOLAX/MIRALAX) powder MIX 1 CAPFUL IN 8 OUNCES OF JUICE OR WATER AND DRINK ONCE DAILY. 04/23/16   [name], MD  potassium chloride SA (K-DUR,KLOR-CON) 20 MEQ tablet Take 1 tablet (20 mEq total) by mouth daily. 01/03/16   [name], MD  predniSONE (DELTASONE) 10 MG tablet 6,5,4,3,2,1 taper 05/17/16   Fransico Meadow, PA-C  simvastatin (ZOCOR) 20 MG tablet TAKE (1) TABLET BY MOUTH DAILY AT 6 PM FOR CHOLESTEROL. 03/14/16   [name], MD  SYMBICORT 160-4.5 MCG/ACT inhaler INHALE 2 PUFFS INTO THE LUNGS TWICE DAILY. 02/20/16   [name], MD  tiZANidine (ZANAFLEX) 4 MG tablet TAKE ONE TABLET EVERY 8 HOURS AS NEEDED FOR MUSCLE SPASMS. 04/23/16   [name], MD    Family History Family History  Problem Relation Age of Onset  . Cancer Mother     breast  . COPD Father   . Throat cancer Sister   . Schizophrenia Brother   . Heart attack Brother   . Lung cancer Sister     former smoker    Social History Social History  Substance Use Topics  . Smoking status: Current Every Day Smoker    Packs/day: 0.50    Years: 50.00    Types:  Cigarettes  . Smokeless tobacco: Never Used     Comment: cigaretes  now 10 to 15 per day  . Alcohol use No     Allergies   Penicillins and Sulfonamide derivatives   Review of Systems Review of Systems  All other systems reviewed and are negative.    Physical Exam Updated Vital Signs BP (!) 148/66 (BP Location: Right Arm)   Pulse 95   Temp 100 F (37.8 C) (Oral)   Resp 16   Ht _0  (1.6 m)   Wt 73 kg   SpO2 94%   BMI 28.52 kg/m   Physical Exam  Constitutional: She appears  well-developed and well-nourished. No distress.  HENT:  Head: Normocephalic and atraumatic.  Eyes: Conjunctivae are normal.  Neck: Neck supple.  Cardiovascular: Normal rate and regular rhythm.   No murmur heard. Pulmonary/Chest: No respiratory distress. She has wheezes.  Abdominal: Soft. There is no tenderness.  Musculoskeletal: She exhibits no edema.  Neurological: She is alert.  Skin: Skin is warm and dry.  Psychiatric: She has a normal mood and affect.  Nursing note and vitals reviewed.    ED Treatments / Results  Labs (all labs ordered are listed, but only abnormal results are displayed) Labs Reviewed - No data to display  EKG  EKG Interpretation None       Radiology Dg Chest 2 View  Result Date: 05/17/2016 CLINICAL DATA:  Cough.  History of breast cancer EXAM: CHEST  2 VIEW COMPARISON:  12/02/2015 FINDINGS: Heart size and vascularity normal. Lungs are clear. Mild apical pleural scarring bilaterally is unchanged. Negative for mass or confusion. IMPRESSION: No active cardiopulmonary disease. Electronically Signed   By: Franchot Gallo M.D.   On: 05/17/2016 19:12   Dg Knee Complete 4 Views Right  Result Date: 05/17/2016 CLINICAL DATA:  Right hip and anterior knee pain after tripping on a cement parking column today and falling on the right knee. Anterior right knee abrasion. EXAM: RIGHT KNEE - COMPLETE 4+ VIEW COMPARISON:  02/12/2014. FINDINGS: Moderate-sized anterior patellar spur.  Otherwise, normal appearing bones and soft tissues. No fracture, dislocation or effusion seen. IMPRESSION: No fracture or effusion. Electronically Signed   By: Claudie Revering M.D.   On: 05/17/2016 17:02   Dg Hip Unilat W Or Wo Pelvis 2-3 Views Right  Result Date: 05/17/2016 CLINICAL DATA:  Right hip and anterior knee pain after tripping on a cement parking column today and falling on the right knee. Anterior right knee abrasion. EXAM: DG HIP (WITH OR WITHOUT PELVIS) 2-3V RIGHT COMPARISON:  None. FINDINGS: Normal appearing hips with no right hip fracture or dislocation. Mild lower lumbar spine degenerative changes. Pelvic and left lower quadrant abdominal surgical clips. Atheromatous arterial calcifications. IMPRESSION: No acute abnormality. Electronically Signed   By: Claudie Revering M.D.   On: 05/17/2016 17:03    Procedures Procedures (including critical care time)  Medications Ordered in ED Medications  ipratropium-albuterol (DUONEB) 0.5-2.5 (3) MG/3ML nebulizer solution 3 mL (3 mLs Nebulization Given 05/17/16 1749)  bacitracin 500 UNIT/GM ointment (1 application  Given 01/24/35 1808)     Initial Impression / Assessment and Plan / ED Course  I have reviewed the triage vital signs and the nursing notes.  Pertinent labs & imaging results that were available during my care of the patient were reviewed by me and considered in my medical decision making (see chart for details).     Lungs decreased wheezing after treatment.  Xrays no fracture.  Pt advised to follow up with her Md for recheck in 2-3 days.    Final Clinical Impressions(s) / ED Diagnoses   Final diagnoses:  Abrasion, knee, right, initial encounter  Contusion of right hip, initial encounter  Fall, initial encounter  Bronchitis    New Prescriptions Discharge Medication List as of 05/17/2016  7:23 PM    START taking these medications   Details  azithromycin (ZITHROMAX) 250 MG tablet Take 1 tablet (250 mg total) by mouth daily. Take  first 2 tablets together, then 1 every day until finished., Starting Thu 05/17/2016, Print    predniSONE (DELTASONE) 10 MG tablet 6,5,4,3,2,1 taper, Print  An After Visit Summary was printed and given to the patient.    Hollace Kinnier [place], PA-C 05/18/16 0021    Milton Ferguson, MD 05/18/16 [phone]

## 2016-05-22 ENCOUNTER — Telehealth: Payer: Self-pay | Admitting: Orthopaedic Surgery

## 2016-05-22 MED ORDER — HYDROCODONE-ACETAMINOPHEN 5-325 MG PO TABS: 1.0000 | ORAL_TABLET | Freq: Four times a day (QID) | ORAL | 0 refills | Status: DC | PRN

## 2016-05-22 NOTE — Telephone Encounter (Signed)
Hydrocodone-Acetaminophen 5/325mg  Qty 35 Tablets °

## 2016-05-23 ENCOUNTER — Ambulatory Visit (INDEPENDENT_AMBULATORY_CARE_PROVIDER_SITE_OTHER): Payer: Medicare Other | Admitting: Family Medicine

## 2016-05-23 ENCOUNTER — Encounter: Payer: Self-pay | Admitting: Family Medicine

## 2016-05-23 VITALS — BP 130/82 | HR 84 | Resp 16 | Ht 63.0 in | Wt 161.0 lb

## 2016-05-23 DIAGNOSIS — F17200 Nicotine dependence, unspecified, uncomplicated: Secondary | ICD-10-CM

## 2016-05-23 DIAGNOSIS — I10 Essential (primary) hypertension: Secondary | ICD-10-CM

## 2016-05-23 DIAGNOSIS — M25571 Pain in right ankle and joints of right foot: Secondary | ICD-10-CM | POA: Diagnosis not present

## 2016-05-23 NOTE — Assessment & Plan Note (Addendum)
Right ankle pain, swelling and bruising following a fall in parking  In parking lot at [employer] on 05/17/2016 will have ortho evaluate and manage, pt has already had initial ED evaluation for the injury

## 2016-05-23 NOTE — Patient Instructions (Signed)
f/u in 5 month, call if you need me before  You are referred to Dr Luna Glasgow re swelling and bruise on right ankle following fall. Cut on right knee is well healed  Please cur back on smoking   Call the dentist re chipped tooth you report, we do not refer to dentist

## 2016-05-24 ENCOUNTER — Encounter (HOSPITAL_COMMUNITY): Payer: Self-pay | Admitting: Emergency Medicine

## 2016-05-24 ENCOUNTER — Emergency Department (HOSPITAL_COMMUNITY): Payer: Medicare Other

## 2016-05-24 ENCOUNTER — Emergency Department (HOSPITAL_COMMUNITY)
Admission: EM | Admit: 2016-05-24 | Discharge: 2016-05-24 | Disposition: A | Discharge status: Home / Self Care | Payer: Medicare Other | Attending: Emergency Medicine | Admitting: Emergency Medicine

## 2016-05-24 DIAGNOSIS — M25571 Pain in right ankle and joints of right foot: Secondary | ICD-10-CM | POA: Diagnosis not present

## 2016-05-24 DIAGNOSIS — Y9259 Other trade areas as the place of occurrence of the external cause: Secondary | ICD-10-CM | POA: Insufficient documentation

## 2016-05-24 DIAGNOSIS — W19XXXA Unspecified fall, initial encounter: Secondary | ICD-10-CM | POA: Diagnosis not present

## 2016-05-24 DIAGNOSIS — Z7982 Long term (current) use of aspirin: Secondary | ICD-10-CM | POA: Insufficient documentation

## 2016-05-24 DIAGNOSIS — E119 Type 2 diabetes mellitus without complications: Secondary | ICD-10-CM | POA: Diagnosis not present

## 2016-05-24 DIAGNOSIS — F17210 Nicotine dependence, cigarettes, uncomplicated: Secondary | ICD-10-CM | POA: Diagnosis not present

## 2016-05-24 DIAGNOSIS — Z79899 Other long term (current) drug therapy: Secondary | ICD-10-CM | POA: Insufficient documentation

## 2016-05-24 DIAGNOSIS — Z853 Personal history of malignant neoplasm of breast: Secondary | ICD-10-CM | POA: Insufficient documentation

## 2016-05-24 DIAGNOSIS — S99911A Unspecified injury of right ankle, initial encounter: Secondary | ICD-10-CM | POA: Diagnosis present

## 2016-05-24 DIAGNOSIS — S93401A Sprain of unspecified ligament of right ankle, initial encounter: Secondary | ICD-10-CM | POA: Diagnosis not present

## 2016-05-24 DIAGNOSIS — I10 Essential (primary) hypertension: Secondary | ICD-10-CM | POA: Insufficient documentation

## 2016-05-24 DIAGNOSIS — Y939 Activity, unspecified: Secondary | ICD-10-CM | POA: Insufficient documentation

## 2016-05-24 DIAGNOSIS — J449 Chronic obstructive pulmonary disease, unspecified: Secondary | ICD-10-CM | POA: Diagnosis not present

## 2016-05-24 DIAGNOSIS — Y999 Unspecified external cause status: Secondary | ICD-10-CM | POA: Insufficient documentation

## 2016-05-24 MED ORDER — IBUPROFEN 400 MG PO TABS
400.0000 mg | ORAL_TABLET | Freq: Once | ORAL | Status: AC
  Administered 2016-05-24: 400 mg via ORAL
  Filled 2016-05-24: qty 1

## 2016-05-24 NOTE — ED Notes (Signed)
Patient ambulatory to the restroom and back to her room.

## 2016-05-24 NOTE — ED Triage Notes (Signed)
Pt fell a week ago, came in for xray and was referred to [facility]. Pt was seen in office today and sent back here for another xray. Right ankle swollen.

## 2016-05-24 NOTE — Discharge Instructions (Signed)
Return if any problems.

## 2016-05-24 NOTE — ED Provider Notes (Signed)
[facility] DEPT Provider Note   CSN: [id number] Arrival date & time: 05/24/16  1017     History   Chief Complaint Chief Complaint  Patient presents with  . Ankle Pain    HPI [name] is a 71 y.o. female.  The history is provided by the patient. No language interpreter was used.  Ankle Pain   The incident occurred more than 1 week ago. Incident location: pt fell at pharmacy a week ago. The injury mechanism was a fall. The pain is present in the right ankle. The quality of the pain is described as aching. The pain is moderate. Pertinent negatives include no inability to bear weight. She reports no foreign bodies present. She has tried nothing for the symptoms. The treatment provided no relief.  Pt seen by me a week ago after falling.  Pt saw Dr. Luna Glasgow today.  Pt reports swelling in her right ankle.  Dr. Terald Sleeper request an xray of pt's ankle.  Pt is able to walk.  Pt reports ankle is more swollen than after fall  Past Medical History:  Diagnosis Date  . Breast cancer ([place]) 03/11/2012   Stage I (T1b N0 M0), grade 1 well-differentiated carcinoma of the left breast status, post lumpectomy followed by radiation therapy. Her estrogen receptor receptors were 93%, progesterone receptors 67%. HER-2/neu was negative. No lymphovascular space invasion was seen. All margins were clear. Ki-67 marker was low at 1% with surgery on 11/15/2004. Treated then with post-lumpectomy radiation, finish  . Breast cancer, left breast ([place])   . COPD (chronic obstructive pulmonary disease) ([place])   . Diabetes mellitus without complication ([place])   . Diverticula of colon   . Hyperlipidemia   . Hypertension   . Kidney stones   . Nicotine dependence   . Osteoarthritis     Patient Active Problem List   Diagnosis Date Noted  . Ankle pain, right 05/23/2016  . Allergic rhinitis 01/04/2015  . 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[place]) 09/26/2014  . Hydrosalpinx 09/28/2013  . Neoplasm of uncertain behavior of ovary  09/23/2013  . Annual physical exam 09/17/2013  . Osteopenia 07/15/2013  . postmenopausal ovarian cyst 04/06/2013  . Pulmonary nodules 04/04/2012  . Breast cancer ([place]) 03/11/2012  . Diabetes mellitus type 2, diet-controlled ([place]) 09/13/2011  . Ganglion cyst 05/08/2011  . COPD (chronic obstructive pulmonary disease) with chronic bronchitis ([place]) 07/24/2009  . Hyperlipidemia LDL goal <100 03/09/2009  . CONSTIPATION, CHRONIC 03/09/2009  . NICOTINE ADDICTION 10/10/2008  . Hypertension goal BP (blood pressure) < 130/80 09/29/2008  . GENERALIZED OSTEOARTHROSIS UNSPECIFIED SITE 09/29/2008    Past Surgical History:  Procedure Laterality Date  . ABDOMINAL HYSTERECTOMY    . BREAST SURGERY  2005 approx   left lumpectomy   . CHOLECYSTECTOMY N/A 05/26/2014   Procedure: LAPAROSCOPIC CHOLECYSTECTOMY;  Surgeon: Aviva Signs Md, MD;  Location: [facility];  Service: General;  Laterality: N/A;  . COLECTOMY     2005, diverticulitis  . DILATION AND CURETTAGE OF UTERUS    . left breast      cancer, in 2006  . TUBAL LIGATION      OB History    Gravida Para Term Preterm AB Living   _0 SAB TAB Ectopic Multiple Live Births   3               Home Medications    Prior to Admission medications   Medication Sig Start Date End Date Taking? Authorizing Provider  acetaminophen (TYLENOL)  500 MG tablet Take 500-1,000 mg by mouth every 6 (six) hours as needed for moderate pain.    Yes [provider]  albuterol (PROVENTIL) (2.5 MG/3ML) 0.083% nebulizer solution USE 1 VIAL IN NEBULIZER 3 TIMES DAILY. 08/11/13  Yes [name], MD  amLODipine (NORVASC) 5 MG tablet TAKE 1 TABLET BY MOUTH ONCE A DAY. 02/06/16  Yes [name], MD  aspirin EC 81 MG tablet Take 81 mg by mouth daily.   Yes [provider]  ATROVENT HFA 17 MCG/ACT inhaler INHALE 2 PUFFS BY MOUTH FOUR TIMES A DAY. 02/13/16  Yes [name], MD  azelastine (ASTELIN) 0.1 % nasal spray PLACE 2 SPRAYS  INTO EACH NOSTRIL TWICE DAILY. 03/14/16  Yes [name], MD  benazepril (LOTENSIN) 40 MG tablet TAKE ONE TABLET BY MOUTH DAILY. 05/14/16  Yes [name], MD  Calcium Carbonate-Vitamin D (CALCIUM 600 + D PO) Take 1 tablet by mouth 2 (two) times daily.    Yes [provider]  HYDROcodone-acetaminophen (NORCO/VICODIN) 5-325 MG tablet Take 1 tablet by mouth every 6 (six) hours as needed for moderate pain. 05/22/16  Yes Sanjuana Kava, MD  ibuprofen (ADVIL,MOTRIN) 800 MG tablet Take 1 tablet (800 mg total) by mouth every 8 (eight) hours as needed. Patient taking differently: Take 800 mg by mouth every 8 (eight) hours as needed for moderate pain.  03/27/16  Yes Sanjuana Kava, MD  loratadine (CLARITIN) 10 MG tablet TAKE 1 TABLET BY MOUTH ONCE DAILY FOR ALLERGIES. 01/02/16  Yes [name], MD  montelukast (SINGULAIR) 10 MG tablet TAKE 1 TABLET BY MOUTH AT BEDTIME. 01/24/16  Yes [name], MD  Omega-3 Fatty Acids (FISH OIL) 1000 MG CAPS Take 2 capsules by mouth daily.   Yes [provider]  polyethylene glycol powder (GLYCOLAX/MIRALAX) powder MIX 1 CAPFUL IN 8 OUNCES OF JUICE OR WATER AND DRINK ONCE DAILY. 04/23/16  Yes [name], MD  potassium chloride SA (K-DUR,KLOR-CON) 20 MEQ tablet Take 1 tablet (20 mEq total) by mouth daily. 01/03/16  Yes [name], MD  simvastatin (ZOCOR) 20 MG tablet TAKE (1) TABLET BY MOUTH DAILY AT 6 PM FOR CHOLESTEROL. 03/14/16  Yes [name], MD  SYMBICORT 160-4.5 MCG/ACT inhaler INHALE 2 PUFFS INTO THE LUNGS TWICE DAILY. 02/20/16  Yes [name], MD  tiZANidine (ZANAFLEX) 4 MG tablet TAKE ONE TABLET EVERY 8 HOURS AS NEEDED FOR MUSCLE SPASMS. 04/23/16  Yes [name], MD  azithromycin (ZITHROMAX) 250 MG tablet Take 1-2 tablets by mouth daily. 05/17/16   [provider]  predniSONE (STERAPRED UNI-PAK 21 TAB) 10 MG (21) TBPK tablet  05/17/16   [provider]    Family  History Family History  Problem Relation Age of Onset  . Cancer Mother        breast  . COPD Father   . Throat cancer Sister   . Schizophrenia Brother   . Heart attack Brother   . Lung cancer Sister        former smoker    Social History Social History  Substance Use Topics  . Smoking status: Current Every Day Smoker    Packs/day: 0.50    Years: 50.00    Types: Cigarettes  . Smokeless tobacco: Never Used     Comment: cigaretes  now 10 to 15 per day  . Alcohol use No     Allergies   Penicillins and Sulfonamide derivatives   Review of Systems Review  of Systems  All other systems reviewed and are negative.    Physical Exam Updated Vital Signs BP (!) 156/72   Pulse 80   Temp 98.1 F (36.7 C) (Oral)   Resp 16   Ht _0  (1.6 m)   Wt 73 kg   SpO2 99%   BMI 28.52 kg/m   Physical Exam  Constitutional: She appears well-developed and well-nourished.  Musculoskeletal: She exhibits tenderness.  Swollen tender right ankle,  Pain with movement.  nv and ns intact  Neurological: She is alert.  Skin: Skin is warm.  Psychiatric: She has a normal mood and affect.  Nursing note and vitals reviewed.    ED Treatments / Results  Labs (all labs ordered are listed, but only abnormal results are displayed) Labs Reviewed - No data to display  EKG  EKG Interpretation None       Radiology Dg Ankle Complete Right  Result Date: 05/24/2016 CLINICAL DATA:  Right ankle pain and swelling after injury last week EXAM: RIGHT ANKLE - COMPLETE 3+ VIEW COMPARISON:  None. FINDINGS: Diffuse right ankle soft tissue swelling. No fracture or subluxation. No suspicious focal osseous lesion. Degenerative changes in the dorsal tarsal joints. Small Achilles right calcaneal spur. No radiopaque foreign body. IMPRESSION: Diffuse right ankle soft tissue swelling, with no fracture or subluxation. Electronically Signed   By: Ilona Sorrel M.D.   On: 05/24/2016 11:35    Procedures Procedures  (including critical care time)  Medications Ordered in ED Medications  ibuprofen (ADVIL,MOTRIN) tablet 400 mg (400 mg Oral Given 05/24/16 1119)     Initial Impression / Assessment and Plan / ED Course  I have reviewed the triage vital signs and the nursing notes.  Pertinent labs & imaging results that were available during my care of the patient were reviewed by me and considered in my medical decision making (see chart for details).     No fracture.  Pt placed in an ace wrap for comfort.  Pt is scheduled to see Dr. Luna Glasgow tomorrow for recheck.  I called office with xray report  Final Clinical Impressions(s) / ED Diagnoses   Final diagnoses:  Moderate ankle sprain, right, initial encounter    New Prescriptions Discharge Medication List as of 05/24/2016 11:56 AM    An After Visit Summary was printed and given to the patient.   Fransico Meadow, PA-C 05/24/16 1340    Isla Pence, MD 05/24/16 [phone]

## 2016-05-25 ENCOUNTER — Ambulatory Visit (INDEPENDENT_AMBULATORY_CARE_PROVIDER_SITE_OTHER): Payer: Medicare Other | Admitting: Orthopaedic Surgery

## 2016-05-25 ENCOUNTER — Encounter: Payer: Self-pay | Admitting: Orthopaedic Surgery

## 2016-05-25 VITALS — BP 162/89 | HR 88 | Ht 63.0 in | Wt 162.0 lb

## 2016-05-25 DIAGNOSIS — S96911A Strain of unspecified muscle and tendon at ankle and foot level, right foot, initial encounter: Secondary | ICD-10-CM

## 2016-05-25 NOTE — Progress Notes (Signed)
Patient [name], female DOB:March 20, 1945, 70 y.o. RKY:[id number]  Chief Complaint  Patient presents with  . Ankle Injury    right ankle-injured 1wk ago    HPI  [name] is a 71 y.o. female who fell and twisted her right ankle two days ago.  She went to the ER yesterday and x-rays were negative for a fracture.  She has brace and is using that.  She had no other injury.  I have reviewed the ER records, x-rays and report.  HPI  Body mass index is 28.7 kg/m.  ROS  Review of Systems  HENT: Negative for congestion.   Respiratory: Positive for shortness of breath. Negative for cough.   Cardiovascular: Negative for chest pain and leg swelling.  Endocrine: Positive for cold intolerance.  Musculoskeletal: Positive for arthralgias, gait problem, joint swelling and myalgias.  Allergic/Immunologic: Positive for environmental allergies.    Past Medical History:  Diagnosis Date  . Breast cancer ([place]) 03/11/2012   Stage I (T1b N0 M0), grade 1 well-differentiated carcinoma of the left breast status, post lumpectomy followed by radiation therapy. Her estrogen receptor receptors were 93%, progesterone receptors 67%. HER-2/neu was negative. No lymphovascular space invasion was seen. All margins were clear. Ki-67 marker was low at 1% with surgery on 11/15/2004. Treated then with post-lumpectomy radiation, finish  . Breast cancer, left breast ([place])   . COPD (chronic obstructive pulmonary disease) ([place])   . Diabetes mellitus without complication ([place])   . Diverticula of colon   . Hyperlipidemia   . Hypertension   . Kidney stones   . Nicotine dependence   . Osteoarthritis     Past Surgical History:  Procedure Laterality Date  . ABDOMINAL HYSTERECTOMY    . BREAST SURGERY  2005 approx   left lumpectomy   . CHOLECYSTECTOMY N/A 05/26/2014   Procedure: LAPAROSCOPIC CHOLECYSTECTOMY;  Surgeon: Aviva Signs Md, MD;  Location: [facility];  Service: General;  Laterality: N/A;  . COLECTOMY      2005, diverticulitis  . DILATION AND CURETTAGE OF UTERUS    . left breast      cancer, in 2006  . TUBAL LIGATION      Family History  Problem Relation Age of Onset  . Cancer Mother        breast  . COPD Father   . Throat cancer Sister   . Schizophrenia Brother   . Heart attack Brother   . Lung cancer Sister        former smoker    Social History Social History  Substance Use Topics  . Smoking status: Current Every Day Smoker    Packs/day: 0.50    Years: 50.00    Types: Cigarettes  . Smokeless tobacco: Never Used     Comment: cigaretes  now 10 to 15 per day  . Alcohol use No    Allergies  Allergen Reactions  . Penicillins Hives, Shortness Of Breath and Swelling    Has patient had a PCN reaction causing immediate rash, facial/tongue/throat swelling, SOB or lightheadedness with hypotension: yes Has patient had a PCN reaction causing severe rash involving mucus membranes or skin necrosis:no Has patient had a PCN reaction that required hospitalization: yes Has patient had a PCN reaction occurring within the last 10 years: no If all of the above answers are "NO", then may proceed with Cephalosporin use.  . Sulfonamide Derivatives Other (See Comments)    Shaking all over, seizure like symptoms. Hospitalization resulted     Current Outpatient  Prescriptions  Medication Sig Dispense Refill  . acetaminophen (TYLENOL) 500 MG tablet Take 500-1,000 mg by mouth every 6 (six) hours as needed for moderate pain.     Marland Kitchen albuterol (PROVENTIL) (2.5 MG/3ML) 0.083% nebulizer solution USE 1 VIAL IN NEBULIZER 3 TIMES DAILY. 300 mL 0  . amLODipine (NORVASC) 5 MG tablet TAKE 1 TABLET BY MOUTH ONCE A DAY. 90 tablet 1  . aspirin EC 81 MG tablet Take 81 mg by mouth daily.    . ATROVENT HFA 17 MCG/ACT inhaler INHALE 2 PUFFS BY MOUTH FOUR TIMES A DAY. 12.9 g 5  . azelastine (ASTELIN) 0.1 % nasal spray PLACE 2 SPRAYS INTO EACH NOSTRIL TWICE DAILY. 30 mL 5  . azithromycin (ZITHROMAX) 250 MG tablet  Take 1-2 tablets by mouth daily.    . benazepril (LOTENSIN) 40 MG tablet TAKE ONE TABLET BY MOUTH DAILY. 90 tablet 1  . Calcium Carbonate-Vitamin D (CALCIUM 600 + D PO) Take 1 tablet by mouth 2 (two) times daily.     Marland Kitchen HYDROcodone-acetaminophen (NORCO/VICODIN) 5-325 MG tablet Take 1 tablet by mouth every 6 (six) hours as needed for moderate pain. 30 tablet 0  . ibuprofen (ADVIL,MOTRIN) 800 MG tablet Take 1 tablet (800 mg total) by mouth every 8 (eight) hours as needed. (Patient taking differently: Take 800 mg by mouth every 8 (eight) hours as needed for moderate pain. ) 90 tablet 0  . loratadine (CLARITIN) 10 MG tablet TAKE 1 TABLET BY MOUTH ONCE DAILY FOR ALLERGIES. 90 tablet 1  . montelukast (SINGULAIR) 10 MG tablet TAKE 1 TABLET BY MOUTH AT BEDTIME. 90 tablet 1  . Omega-3 Fatty Acids (FISH OIL) 1000 MG CAPS Take 2 capsules by mouth daily.    . polyethylene glycol powder (GLYCOLAX/MIRALAX) powder MIX 1 CAPFUL IN 8 OUNCES OF JUICE OR WATER AND DRINK ONCE DAILY. 527 g 5  . potassium chloride SA (K-DUR,KLOR-CON) 20 MEQ tablet Take 1 tablet (20 mEq total) by mouth daily. 90 tablet 0  . predniSONE (STERAPRED UNI-PAK 21 TAB) 10 MG (21) TBPK tablet     . simvastatin (ZOCOR) 20 MG tablet TAKE (1) TABLET BY MOUTH DAILY AT 6 PM FOR CHOLESTEROL. 90 tablet 1  . SYMBICORT 160-4.5 MCG/ACT inhaler INHALE 2 PUFFS INTO THE LUNGS TWICE DAILY. 10.2 g 5  . tiZANidine (ZANAFLEX) 4 MG tablet TAKE ONE TABLET EVERY 8 HOURS AS NEEDED FOR MUSCLE SPASMS. 45 tablet 0   Current Facility-Administered Medications  Medication Dose Route Frequency Provider Last Rate Last Dose  . Influenza (>/= 3 years) inactive virus vaccine (FLVIRIN/FLUZONE) injection SUSP 0.5 mL  0.5 mL Intramuscular Once [name], MD         Physical Exam  Blood pressure (!) 162/89, pulse 88, height '5\' 3"'  (1.6 m), weight 162 lb (73.5 kg).  Constitutional: overall normal hygiene, normal nutrition, well developed, normal grooming, normal body  habitus. Assistive device:braces  Musculoskeletal: gait and station Limp right, muscle tone and strength are normal, no tremors or atrophy is present.  .  Neurological: coordination overall normal.  Deep tendon reflex/nerve stretch intact.  Sensation normal.  Cranial nerves II-XII intact.   Skin:   Normal overall no scars, lesions, ulcers or rashes. No psoriasis.  Psychiatric: Alert and oriented x 3.  Recent memory intact, remote memory unclear.  Normal mood and affect. Well groomed.  Good eye contact.  Cardiovascular: overall no swelling, no varicosities, no edema bilaterally, normal temperatures of the legs and arms, no clubbing, cyanosis and good capillary refill.  Lymphatic: palpation is normal.  Right ankle has lateral swelling, pain over the anterior talofibular ligament and some pain. ROM is full however.  She has limp to the right.  NV intact.  The patient has been educated about the nature of the problem(s) and counseled on treatment options.  The patient appeared to understand what I have discussed and is in agreement with it.  Encounter Diagnosis  Name Primary?  . Strain of right ankle, initial encounter Yes    PLAN Call if any problems.  Precautions discussed.  Continue current medications.   Return to clinic 1 week   Contrast bath sheet of instructions given.  Electronically Signed Sanjuana Kava, MD 5/11/20189:41 AM

## 2016-05-27 NOTE — Assessment & Plan Note (Signed)

## 2016-05-27 NOTE — Assessment & Plan Note (Signed)
Controlled, no change in medication  

## 2016-05-27 NOTE — Progress Notes (Signed)
   [name]     MRN: [id number]      DOB: 04/23/1945   HPI [name] is here for follow up after fall in parking lot of local business where she sustained trauma to right knee and ankle. She was evaluated in the ED the same day on 5/03, the right knee has an abrasion which is healed, she was treated with an antibiotic course which she has completed. Today she is c/o swelling and bruising of the right ankle , which she is able to weight bear on , but which is painful. Also still c/o residual right knee pain Also c/o chipped tooth fas a result of her fall  ROS Denies recent fever or chills. Denies sinus pressure, nasal congestion, ear pain or sore throat. Denies chest congestion, productive cough or wheezing. Denies chest pains, palpitations and leg swelling Denies abdominal pain, nausea, vomiting,diarrhea or constipation.   Denies dysuria, frequency, hesitancy or incontinence.   PE  BP 130/82   Pulse 84   Resp 16   Ht 5\' 3"  (1.6 m)   Wt 161 lb (73 kg)   SpO2 95%   BMI 28.52 kg/m   Patient alert and oriented and in no cardiopulmonary distress.  HEENT: No facial asymmetry, EOMI,   oropharynx pink and moist.  Neck supple no JVD, no mass.Poor denttiotion  Chest: Decreased air entry no wheezes.  CVS: S1, S2 no murmurs, no S3.Regular rate.  ABD: Soft non tender.   Ext: No edema  MS: Adequate ROM spine, right knee has healed abrasion with full ROM, right ankle is swollen and mildly tender with bruising Skin: Intact, no ulcerations or rash noted.    CNS: CN 2-12 intact, power,  normal throughout.no focal deficits noted.   Assessment & Plan  Ankle pain, right Right ankle pain, swelling and bruising following a fall in parking  In parking lot at [employer] on 05/17/2016 will have ortho evaluate and manage, pt has already had initial ED evaluation for the injury  NICOTINE ADDICTION Patient counseled for approximately 5 minutes regarding the health risks of  ongoing nicotine use, specifically all types of cancer, heart disease, stroke and respiratory failure. The options available for help with cessation ,the behavioral changes to assist the process, and the option to either gradully reduce usage  Or abruptly stop.is also discussed. Pt is also encouraged to set specific goals in number of cigarettes used daily, as well as to set a quit date.     Hypertension goal BP (blood pressure) < 130/80 Controlled, no change in medication

## 2016-05-28 ENCOUNTER — Other Ambulatory Visit: Payer: Self-pay | Admitting: Family Medicine

## 2016-05-28 ENCOUNTER — Other Ambulatory Visit (HOSPITAL_COMMUNITY): Payer: Self-pay | Admitting: Oncology

## 2016-05-29 ENCOUNTER — Other Ambulatory Visit: Payer: Self-pay | Admitting: Family Medicine

## 2016-05-29 ENCOUNTER — Ambulatory Visit: Payer: Medicare Other | Admitting: Orthopaedic Surgery

## 2016-05-31 ENCOUNTER — Encounter: Payer: Self-pay | Admitting: Orthopaedic Surgery

## 2016-05-31 ENCOUNTER — Ambulatory Visit (INDEPENDENT_AMBULATORY_CARE_PROVIDER_SITE_OTHER): Payer: Medicare Other | Admitting: Orthopaedic Surgery

## 2016-05-31 VITALS — BP 165/87 | HR 87 | Ht 64.0 in | Wt 161.0 lb

## 2016-05-31 DIAGNOSIS — F17210 Nicotine dependence, cigarettes, uncomplicated: Secondary | ICD-10-CM | POA: Diagnosis not present

## 2016-05-31 DIAGNOSIS — S96911A Strain of unspecified muscle and tendon at ankle and foot level, right foot, initial encounter: Secondary | ICD-10-CM | POA: Diagnosis not present

## 2016-05-31 MED ORDER — HYDROCODONE-ACETAMINOPHEN 5-325 MG PO TABS: 1.0000 | ORAL_TABLET | Freq: Four times a day (QID) | ORAL | 0 refills | Status: DC | PRN

## 2016-05-31 NOTE — Patient Instructions (Signed)
Steps to Quit Smoking Smoking tobacco can be bad for your health. It can also affect almost every organ in your body. Smoking puts you and people around you at risk for many serious long-lasting (chronic) diseases. Quitting smoking is hard, but it is one of the best things that you can do for your health. It is never too late to quit. What are the benefits of quitting smoking? When you quit smoking, you lower your risk for getting serious diseases and conditions. They can include:  Lung cancer or lung disease.  Heart disease.  Stroke.  Heart attack.  Not being able to have children (infertility).  Weak bones (osteoporosis) and broken bones (fractures). If you have coughing, wheezing, and shortness of breath, those symptoms may get better when you quit. You may also get sick less often. If you are pregnant, quitting smoking can help to lower your chances of having a baby of low birth weight. What can I do to help me quit smoking? Talk with your doctor about what can help you quit smoking. Some things you can do (strategies) include:  Quitting smoking totally, instead of slowly cutting back how much you smoke over a period of time.  Going to in-person counseling. You are more likely to quit if you go to many counseling sessions.  Using resources and support systems, such as:  Online chats with a counselor.  Phone quitlines.  Printed self-help materials.  Support groups or group counseling.  Text messaging programs.  Mobile phone apps or applications.  Taking medicines. Some of these medicines may have nicotine in them. If you are pregnant or breastfeeding, do not take any medicines to quit smoking unless your doctor says it is okay. Talk with your doctor about counseling or other things that can help you. Talk with your doctor about using more than one strategy at the same time, such as taking medicines while you are also going to in-person counseling. This can help make quitting  easier. What things can I do to make it easier to quit? Quitting smoking might feel very hard at first, but there is a lot that you can do to make it easier. Take these steps:  Talk to your family and friends. Ask them to support and encourage you.  Call phone quitlines, reach out to support groups, or work with a counselor.  Ask people who smoke to not smoke around you.  Avoid places that make you want (trigger) to smoke, such as:  Bars.  Parties.  Smoke-break areas at work.  Spend time with people who do not smoke.  Lower the stress in your life. Stress can make you want to smoke. Try these things to help your stress:  Getting regular exercise.  Deep-breathing exercises.  Yoga.  Meditating.  Doing a body scan. To do this, close your eyes, focus on one area of your body at a time from head to toe, and notice which parts of your body are tense. Try to relax the muscles in those areas.  Download or buy apps on your mobile phone or tablet that can help you stick to your quit plan. There are many free apps, such as QuitGuide from the CDC (Centers for Disease Control and Prevention). You can find more support from smokefree.gov and other websites. This information is not intended to replace advice given to you by your health care provider. Make sure you discuss any questions you have with your health care provider. Document Released: 10/28/2008 Document Revised: 08/30/2015 Document   Reviewed: 05/18/2014 Elsevier Interactive Patient Education  2017 Elsevier Inc.  

## 2016-05-31 NOTE — Progress Notes (Signed)
Patient [name], female DOB:1945/08/03, 71 y.o. JQB:[id number]  Chief Complaint  Patient presents with  . Follow-up    ankle and leg Rt    HPI  [name] is a 71 y.o. female who has pain of the right ankle after a fall last week.  X-rays were negative for fracture.  She has been using her ankle brace.  She has pain and swelling still.  She has been doing the contrast baths.  She has been elevating it.  She has no new trauma. HPI  Body mass index is 27.64 kg/m.  ROS  Review of Systems  HENT: Negative for congestion.   Respiratory: Positive for shortness of breath. Negative for cough.   Cardiovascular: Negative for chest pain and leg swelling.  Endocrine: Positive for cold intolerance.  Musculoskeletal: Positive for arthralgias, gait problem, joint swelling and myalgias.  Allergic/Immunologic: Positive for environmental allergies.    Past Medical History:  Diagnosis Date  . Breast cancer ([place]) 03/11/2012   Stage I (T1b N0 M0), grade 1 well-differentiated carcinoma of the left breast status, post lumpectomy followed by radiation therapy. Her estrogen receptor receptors were 93%, progesterone receptors 67%. HER-2/neu was negative. No lymphovascular space invasion was seen. All margins were clear. Ki-67 marker was low at 1% with surgery on 11/15/2004. Treated then with post-lumpectomy radiation, finish  . Breast cancer, left breast ([place])   . COPD (chronic obstructive pulmonary disease) ([place])   . Diabetes mellitus without complication ([place])   . Diverticula of colon   . Hyperlipidemia   . Hypertension   . Kidney stones   . Nicotine dependence   . Osteoarthritis     Past Surgical History:  Procedure Laterality Date  . ABDOMINAL HYSTERECTOMY    . BREAST SURGERY  2005 approx   left lumpectomy   . CHOLECYSTECTOMY N/A 05/26/2014   Procedure: LAPAROSCOPIC CHOLECYSTECTOMY;  Surgeon: Aviva Signs Md, MD;  Location: [facility];  Service: General;  Laterality: N/A;  .  COLECTOMY     2005, diverticulitis  . DILATION AND CURETTAGE OF UTERUS    . left breast      cancer, in 2006  . TUBAL LIGATION      Family History  Problem Relation Age of Onset  . Cancer Mother        breast  . COPD Father   . Throat cancer Sister   . Schizophrenia Brother   . Heart attack Brother   . Lung cancer Sister        former smoker    Social History Social History  Substance Use Topics  . Smoking status: Current Every Day Smoker    Packs/day: 0.50    Years: 50.00    Types: Cigarettes  . Smokeless tobacco: Never Used     Comment: cigaretes  now 10 to 15 per day  . Alcohol use No    Allergies  Allergen Reactions  . Penicillins Hives, Shortness Of Breath and Swelling    Has patient had a PCN reaction causing immediate rash, facial/tongue/throat swelling, SOB or lightheadedness with hypotension: yes Has patient had a PCN reaction causing severe rash involving mucus membranes or skin necrosis:no Has patient had a PCN reaction that required hospitalization: yes Has patient had a PCN reaction occurring within the last 10 years: no If all of the above answers are "NO", then may proceed with Cephalosporin use.  . Sulfonamide Derivatives Other (See Comments)    Shaking all over, seizure like symptoms. Hospitalization resulted  Current Outpatient Prescriptions  Medication Sig Dispense Refill  . acetaminophen (TYLENOL) 500 MG tablet Take 500-1,000 mg by mouth every 6 (six) hours as needed for moderate pain.     Marland Kitchen albuterol (PROVENTIL) (2.5 MG/3ML) 0.083% nebulizer solution USE 1 VIAL IN NEBULIZER 3 TIMES DAILY. 300 mL 0  . amLODipine (NORVASC) 5 MG tablet TAKE 1 TABLET BY MOUTH ONCE A DAY. 90 tablet 1  . aspirin EC 81 MG tablet Take 81 mg by mouth daily.    . ATROVENT HFA 17 MCG/ACT inhaler INHALE 2 PUFFS BY MOUTH FOUR TIMES A DAY. 12.9 g 5  . azelastine (ASTELIN) 0.1 % nasal spray PLACE 2 SPRAYS INTO EACH NOSTRIL TWICE DAILY. 30 mL 5  . azithromycin (ZITHROMAX)  250 MG tablet Take 1-2 tablets by mouth daily.    . benazepril (LOTENSIN) 40 MG tablet TAKE ONE TABLET BY MOUTH DAILY. 90 tablet 1  . Calcium Carbonate-Vitamin D (CALCIUM 600 + D PO) Take 1 tablet by mouth 2 (two) times daily.     Marland Kitchen HYDROcodone-acetaminophen (NORCO/VICODIN) 5-325 MG tablet Take 1 tablet by mouth every 6 (six) hours as needed for moderate pain. 30 tablet 0  . ibuprofen (ADVIL,MOTRIN) 800 MG tablet Take 1 tablet (800 mg total) by mouth every 8 (eight) hours as needed. (Patient taking differently: Take 800 mg by mouth every 8 (eight) hours as needed for moderate pain. ) 90 tablet 0  . loratadine (CLARITIN) 10 MG tablet TAKE 1 TABLET BY MOUTH ONCE DAILY FOR ALLERGIES. 90 tablet 1  . montelukast (SINGULAIR) 10 MG tablet TAKE 1 TABLET BY MOUTH AT BEDTIME. 90 tablet 1  . Omega-3 Fatty Acids (FISH OIL) 1000 MG CAPS Take 2 capsules by mouth daily.    . polyethylene glycol powder (GLYCOLAX/MIRALAX) powder MIX 1 CAPFUL IN 8 OUNCES OF JUICE OR WATER AND DRINK ONCE DAILY. 527 g 5  . potassium chloride SA (K-DUR,KLOR-CON) 20 MEQ tablet Take 1 tablet (20 mEq total) by mouth daily. 90 tablet 0  . predniSONE (STERAPRED UNI-PAK 21 TAB) 10 MG (21) TBPK tablet     . simvastatin (ZOCOR) 20 MG tablet TAKE (1) TABLET BY MOUTH DAILY AT 6 PM FOR CHOLESTEROL. 90 tablet 1  . SYMBICORT 160-4.5 MCG/ACT inhaler INHALE 2 PUFFS INTO THE LUNGS TWICE DAILY. 10.2 g 5  . tiZANidine (ZANAFLEX) 4 MG tablet TAKE ONE TABLET EVERY 8 HOURS AS NEEDED FOR MUSCLE SPASMS. 45 tablet 1   Current Facility-Administered Medications  Medication Dose Route Frequency Provider Last Rate Last Dose  . Influenza (>/= 3 years) inactive virus vaccine (FLVIRIN/FLUZONE) injection SUSP 0.5 mL  0.5 mL Intramuscular Once [name], MD         Physical Exam  Blood pressure (!) 165/87, pulse 87, height '5\' 4"'  (1.626 m), weight 161 lb (73 kg).  Constitutional: overall normal hygiene, normal nutrition, well developed, normal  grooming, normal body habitus. Assistive device:ankle brace right  Musculoskeletal: gait and station Limp right, muscle tone and strength are normal, no tremors or atrophy is present.  .  Neurological: coordination overall normal.  Deep tendon reflex/nerve stretch intact.  Sensation normal.  Cranial nerves II-XII intact.   Skin:   Normal overall no scars, lesions, ulcers or rashes. No psoriasis.  Psychiatric: Alert and oriented x 3.  Recent memory intact, remote memory unclear.  Normal mood and affect. Well groomed.  Good eye contact.  Cardiovascular: overall no swelling, no varicosities, no edema bilaterally, normal temperatures of the legs and arms, no clubbing, cyanosis  and good capillary refill.  Lymphatic: palpation is normal.  Right ankle with lateral swelling and pain.  ROM is full but tender.  NV intact.  She has contusions of the lower leg more anteriorly that were not that apparent last time.  The patient has been educated about the nature of the problem(s) and counseled on treatment options.  The patient appeared to understand what I have discussed and is in agreement with it.  Encounter Diagnoses  Name Primary?  . Strain of right ankle, initial encounter Yes  . Cigarette nicotine dependence without complication     PLAN Call if any problems.  Precautions discussed.  Continue current medications.   Return to clinic 2 weeks   Continue elevation, contrast baths.  I have reviewed the [place] web site prior to prescribing narcotic medicine for this patient.  Electronically Signed Sanjuana Kava, MD 5/17/20181:16 PM

## 2016-06-04 ENCOUNTER — Other Ambulatory Visit: Payer: Self-pay | Admitting: Family Medicine

## 2016-06-06 ENCOUNTER — Telehealth: Payer: Self-pay

## 2016-06-06 NOTE — Telephone Encounter (Signed)
Coughing up greenish mucus and ears hurting x 1 week. Requesting zpak again. Also has a pain in her lower abdomen on the right side and thinks urine is infected. No dysuria or frequency. Please advise

## 2016-06-07 ENCOUNTER — Telehealth: Payer: Self-pay

## 2016-06-07 NOTE — Telephone Encounter (Signed)
Pt returned dustys call.

## 2016-06-07 NOTE — Telephone Encounter (Signed)
Pt will need re evaluation in the office , without fever , unlikely to need antibiotic, her last oV was over 3 weeks ago

## 2016-06-08 ENCOUNTER — Ambulatory Visit (INDEPENDENT_AMBULATORY_CARE_PROVIDER_SITE_OTHER): Payer: Medicare Other | Admitting: Family Medicine

## 2016-06-08 ENCOUNTER — Encounter: Payer: Self-pay | Admitting: Family Medicine

## 2016-06-08 VITALS — BP 146/70 | HR 92 | Temp 97.0°F | Resp 18 | Ht 64.0 in | Wt 159.0 lb

## 2016-06-08 DIAGNOSIS — Z9109 Other allergy status, other than to drugs and biological substances: Secondary | ICD-10-CM

## 2016-06-08 MED ORDER — PREDNISONE 20 MG PO TABS: 20.0000 mg | ORAL_TABLET | Freq: Two times a day (BID) | ORAL | 0 refills | Status: DC

## 2016-06-08 MED ORDER — FLUTICASONE PROPIONATE 50 MCG/ACT NA SUSP: 2.0000 | Freq: Every day | NASAL | 6 refills | Status: DC

## 2016-06-08 NOTE — Progress Notes (Signed)
Chief Complaint  Patient presents with  . Sinus Problem  Patient is here for an upper respiratory symptoms. She was seen in the emergency room for an ankle sprain on 05/17/16 and received prednisone and a Z-Pak for COPD exacerbation. She is still having some sinus pressure, nasal drainage, mild sore throat, and cough. No shortness of breath. No sputum. No fever. No chills. She is requesting another Z-Pak. I told her that it's too soon.   Patient Active Problem List   Diagnosis Date Noted  . Ankle pain, right 05/23/2016  . Allergic rhinitis 01/04/2015  . 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[place]) 09/26/2014  . Hydrosalpinx 09/28/2013  . Osteopenia 07/15/2013  . postmenopausal ovarian cyst 04/06/2013  . Pulmonary nodules 04/04/2012  . Breast cancer ([place]) 03/11/2012  . Diabetes mellitus type 2, diet-controlled ([place]) 09/13/2011  . Ganglion cyst 05/08/2011  . COPD (chronic obstructive pulmonary disease) with chronic bronchitis ([place]) 07/24/2009  . Hyperlipidemia LDL goal <100 03/09/2009  . CONSTIPATION, CHRONIC 03/09/2009  . NICOTINE ADDICTION 10/10/2008  . Hypertension goal BP (blood pressure) < 130/80 09/29/2008  . GENERALIZED OSTEOARTHROSIS UNSPECIFIED SITE 09/29/2008    Outpatient Encounter Prescriptions as of 06/08/2016  Medication Sig  . acetaminophen (TYLENOL) 500 MG tablet Take 500-1,000 mg by mouth every 6 (six) hours as needed for moderate pain.   Marland Kitchen albuterol (PROVENTIL) (2.5 MG/3ML) 0.083% nebulizer solution USE 1 VIAL IN NEBULIZER 3 TIMES DAILY.  Marland Kitchen amLODipine (NORVASC) 5 MG tablet TAKE 1 TABLET BY MOUTH ONCE A DAY.  Marland Kitchen aspirin EC 81 MG tablet Take 81 mg by mouth daily.  . ATROVENT HFA 17 MCG/ACT inhaler INHALE 2 PUFFS BY MOUTH FOUR TIMES A DAY.  Marland Kitchen azelastine (ASTELIN) 0.1 % nasal spray PLACE 2 SPRAYS INTO EACH NOSTRIL TWICE DAILY.  Marland Kitchen azithromycin (ZITHROMAX) 250 MG tablet Take 1-2 tablets by mouth daily.  . benazepril (LOTENSIN) 40 MG tablet TAKE ONE TABLET BY MOUTH DAILY.  . Calcium  Carbonate-Vitamin D (CALCIUM 600 + D PO) Take 1 tablet by mouth 2 (two) times daily.   Marland Kitchen HYDROcodone-acetaminophen (NORCO/VICODIN) 5-325 MG tablet Take 1 tablet by mouth every 6 (six) hours as needed for moderate pain.  Marland Kitchen ibuprofen (ADVIL,MOTRIN) 800 MG tablet Take 1 tablet (800 mg total) by mouth every 8 (eight) hours as needed. (Patient taking differently: Take 800 mg by mouth every 8 (eight) hours as needed for moderate pain. )  . loratadine (CLARITIN) 10 MG tablet TAKE 1 TABLET BY MOUTH ONCE DAILY FOR ALLERGIES.  Marland Kitchen montelukast (SINGULAIR) 10 MG tablet TAKE 1 TABLET BY MOUTH AT BEDTIME.  Marland Kitchen Omega-3 Fatty Acids (FISH OIL) 1000 MG CAPS Take 2 capsules by mouth daily.  . polyethylene glycol powder (GLYCOLAX/MIRALAX) powder MIX 1 CAPFUL IN 8 OUNCES OF JUICE OR WATER AND DRINK ONCE DAILY.  Marland Kitchen potassium chloride SA (K-DUR,KLOR-CON) 20 MEQ tablet Take 1 tablet (20 mEq total) by mouth daily.  . simvastatin (ZOCOR) 20 MG tablet TAKE (1) TABLET BY MOUTH DAILY AT 6 PM FOR CHOLESTEROL.  . SYMBICORT 160-4.5 MCG/ACT inhaler INHALE 2 PUFFS INTO THE LUNGS TWICE DAILY.  Marland Kitchen tiZANidine (ZANAFLEX) 4 MG tablet TAKE ONE TABLET EVERY 8 HOURS AS NEEDED FOR MUSCLE SPASMS.  . [DISCONTINUED] predniSONE (STERAPRED UNI-PAK 21 TAB) 10 MG (21) TBPK tablet   . fluticasone (FLONASE) 50 MCG/ACT nasal spray Place 2 sprays into both nostrils daily.  . predniSONE (DELTASONE) 20 MG tablet Take 1 tablet (20 mg total) by mouth 2 (two) times daily with a meal.   Facility-Administered Encounter  Medications as of 06/08/2016  Medication  . Influenza (>/= 3 years) inactive virus vaccine (FLVIRIN/FLUZONE) injection SUSP 0.5 mL    Allergies  Allergen Reactions  . Penicillins Hives, Shortness Of Breath and Swelling    Has patient had a PCN reaction causing immediate rash, facial/tongue/throat swelling, SOB or lightheadedness with hypotension: yes Has patient had a PCN reaction causing severe rash involving mucus membranes or skin  necrosis:no Has patient had a PCN reaction that required hospitalization: yes Has patient had a PCN reaction occurring within the last 10 years: no If all of the above answers are "NO", then may proceed with Cephalosporin use.  . Sulfonamide Derivatives Other (See Comments)    Shaking all over, seizure like symptoms. Hospitalization resulted     Review of Systems  Constitutional: Negative for activity change, appetite change, chills and fever.  HENT: Positive for congestion, postnasal drip, rhinorrhea and sinus pressure. Negative for sinus pain and sore throat.   Eyes: Negative for redness.  Respiratory: Positive for cough. Negative for chest tightness, shortness of breath and wheezing.   Cardiovascular: Positive for leg swelling. Negative for chest pain and palpitations.       Right ankle is swollen from sprain  Gastrointestinal: Negative for abdominal distention and abdominal pain.  Musculoskeletal: Positive for arthralgias and gait problem.  Neurological: Negative for dizziness and headaches.  Psychiatric/Behavioral: Negative for dysphoric mood. The patient is not nervous/anxious.     BP (!) 146/70 (BP Location: Right Arm, Patient Position: Sitting, Cuff Size: Normal)   Pulse 92   Temp 97 F (36.1 C) (Temporal)   Resp 18   Ht 5\' 4"  (1.626 m)   Wt 159 lb 0.6 oz (72.1 kg)   SpO2 96%   BMI 27.30 kg/m   Physical Exam  Constitutional: She appears well-developed and well-nourished.  Looks a bit disheveled. Smells like cigarettes  HENT:  Head: Normocephalic and atraumatic.  Right Ear: External ear normal.  Left Ear: External ear normal.  Mouth/Throat: Oropharynx is clear and moist.  Nasal membranes swollen and erythematous. Question polyp on the left  Eyes: Conjunctivae are normal. Pupils are equal, round, and reactive to light.  Asymmetry of eyes  Neck: Normal range of motion.  Cardiovascular: Normal rate, regular rhythm and normal heart sounds.   Pulmonary/Chest: Effort  normal. No respiratory distress. She has no wheezes. She has no rales.  Slightly decreased breath sounds. No wheeze rale rhonchi  Abdominal: Soft. Bowel sounds are normal.  Lymphadenopathy:    She has cervical adenopathy.  Neurological: She is alert.  Skin: Skin is warm and dry. No rash noted.  Psychiatric: She has a normal mood and affect. Her behavior is normal.    ASSESSMENT/PLAN:  1. Environmental allergies -Like her symptoms are mostly from allergies. Don't see any real infection. Possibly she had a virus. Certainly doesn't need antibiotics.   Patient Instructions  Push fluids Take the prednisone twice a day Use the flonase daily  Try not to smoke Call if not improving by Tuesday   Raylene Everts, MD

## 2016-06-08 NOTE — Patient Instructions (Addendum)
Push fluids Take the prednisone twice a day Use the flonase daily  Try not to smoke Call if not improving by Tuesday

## 2016-06-12 ENCOUNTER — Telehealth: Payer: Self-pay | Admitting: Family Medicine

## 2016-06-12 NOTE — Telephone Encounter (Signed)
Patient states she does not feel better, she is achey all over and coughing  Uses [facility]  Cb#: [id number]

## 2016-06-13 ENCOUNTER — Other Ambulatory Visit: Payer: Self-pay | Admitting: Family Medicine

## 2016-06-13 ENCOUNTER — Telehealth: Payer: Self-pay | Admitting: Orthopaedic Surgery

## 2016-06-13 MED ORDER — BENZONATATE 100 MG PO CAPS: 100.0000 mg | ORAL_CAPSULE | Freq: Two times a day (BID) | ORAL | 0 refills | Status: DC | PRN

## 2016-06-13 MED ORDER — AZITHROMYCIN 250 MG PO TABS: ORAL_TABLET | ORAL | 0 refills | Status: DC

## 2016-06-13 NOTE — Telephone Encounter (Signed)
Z Pack sent to [facility], pt aware

## 2016-06-13 NOTE — Progress Notes (Signed)
Tessalon perl  

## 2016-06-13 NOTE — Telephone Encounter (Signed)
Patient called for refill (appointment for tomorrow, 06/14/16 needed to be re-scheduled to 06/19/16): HYDROcodone-acetaminophen (NORCO/VICODIN) 5-325 MG tablet 30 tablet

## 2016-06-14 ENCOUNTER — Ambulatory Visit: Payer: Medicare Other | Admitting: Orthopaedic Surgery

## 2016-06-14 MED ORDER — HYDROCODONE-ACETAMINOPHEN 5-325 MG PO TABS: 1.0000 | ORAL_TABLET | Freq: Four times a day (QID) | ORAL | 0 refills | Status: DC | PRN

## 2016-06-19 ENCOUNTER — Encounter: Payer: Self-pay | Admitting: Orthopaedic Surgery

## 2016-06-19 ENCOUNTER — Ambulatory Visit (INDEPENDENT_AMBULATORY_CARE_PROVIDER_SITE_OTHER): Payer: Medicare Other | Admitting: Orthopaedic Surgery

## 2016-06-19 VITALS — BP 181/96 | HR 92 | Ht 64.0 in | Wt 158.0 lb

## 2016-06-19 DIAGNOSIS — F17210 Nicotine dependence, cigarettes, uncomplicated: Secondary | ICD-10-CM | POA: Diagnosis not present

## 2016-06-19 DIAGNOSIS — S96911A Strain of unspecified muscle and tendon at ankle and foot level, right foot, initial encounter: Secondary | ICD-10-CM | POA: Diagnosis not present

## 2016-06-19 NOTE — Progress Notes (Signed)
Patient [name], female DOB:09/21/1945, 71 y.o. QZE:[id number]  Chief Complaint  Patient presents with  . Follow-up    Right ankle    HPI  [name] is a 71 y.o. female who has had right ankle pain from a fall.   She is much improved.  She has an ankle brace but says it no longer helps that much. She is walking well and not having any problem. HPI  Body mass index is 27.12 kg/m.  ROS  Review of Systems  HENT: Negative for congestion.   Respiratory: Positive for shortness of breath. Negative for cough.   Cardiovascular: Negative for chest pain and leg swelling.  Endocrine: Positive for cold intolerance.  Musculoskeletal: Positive for arthralgias, gait problem, joint swelling and myalgias.  Allergic/Immunologic: Positive for environmental allergies.    Past Medical History:  Diagnosis Date  . Breast cancer ([place]) 03/11/2012   Stage I (T1b N0 M0), grade 1 well-differentiated carcinoma of the left breast status, post lumpectomy followed by radiation therapy. Her estrogen receptor receptors were 93%, progesterone receptors 67%. HER-2/neu was negative. No lymphovascular space invasion was seen. All margins were clear. Ki-67 marker was low at 1% with surgery on 11/15/2004. Treated then with post-lumpectomy radiation, finish  . Breast cancer, left breast ([place])   . COPD (chronic obstructive pulmonary disease) ([place])   . Diabetes mellitus without complication ([place])   . Diverticula of colon   . Hyperlipidemia   . Hypertension   . Kidney stones   . Nicotine dependence   . Osteoarthritis     Past Surgical History:  Procedure Laterality Date  . ABDOMINAL HYSTERECTOMY    . BREAST SURGERY  2005 approx   left lumpectomy   . CHOLECYSTECTOMY N/A 05/26/2014   Procedure: LAPAROSCOPIC CHOLECYSTECTOMY;  Surgeon: Aviva Signs Md, MD;  Location: [facility];  Service: General;  Laterality: N/A;  . COLECTOMY     2005, diverticulitis  . DILATION AND CURETTAGE OF UTERUS    . left breast       cancer, in 2006  . TUBAL LIGATION      Family History  Problem Relation Age of Onset  . Cancer Mother        breast  . COPD Father   . Throat cancer Sister   . Schizophrenia Brother   . Heart attack Brother   . Lung cancer Sister        former smoker    Social History Social History  Substance Use Topics  . Smoking status: Current Every Day Smoker    Packs/day: 0.50    Years: 50.00    Types: Cigarettes  . Smokeless tobacco: Never Used     Comment: cigaretes  now 10 to 15 per day  . Alcohol use No    Allergies  Allergen Reactions  . Penicillins Hives, Shortness Of Breath and Swelling    Has patient had a PCN reaction causing immediate rash, facial/tongue/throat swelling, SOB or lightheadedness with hypotension: yes Has patient had a PCN reaction causing severe rash involving mucus membranes or skin necrosis:no Has patient had a PCN reaction that required hospitalization: yes Has patient had a PCN reaction occurring within the last 10 years: no If all of the above answers are "NO", then may proceed with Cephalosporin use.  . Sulfonamide Derivatives Other (See Comments)    Shaking all over, seizure like symptoms. Hospitalization resulted     Current Outpatient Prescriptions  Medication Sig Dispense Refill  . acetaminophen (TYLENOL) 500 MG tablet Take  500-1,000 mg by mouth every 6 (six) hours as needed for moderate pain.     Marland Kitchen albuterol (PROVENTIL) (2.5 MG/3ML) 0.083% nebulizer solution USE 1 VIAL IN NEBULIZER 3 TIMES DAILY. 300 mL 0  . amLODipine (NORVASC) 5 MG tablet TAKE 1 TABLET BY MOUTH ONCE A DAY. 90 tablet 1  . aspirin EC 81 MG tablet Take 81 mg by mouth daily.    . ATROVENT HFA 17 MCG/ACT inhaler INHALE 2 PUFFS BY MOUTH FOUR TIMES A DAY. 12.9 g 5  . azelastine (ASTELIN) 0.1 % nasal spray PLACE 2 SPRAYS INTO EACH NOSTRIL TWICE DAILY. 30 mL 5  . azithromycin (ZITHROMAX) 250 MG tablet Two tablets on day one, then one tablet once daily for an additional four days  6 tablet 0  . benazepril (LOTENSIN) 40 MG tablet TAKE ONE TABLET BY MOUTH DAILY. 90 tablet 1  . benzonatate (TESSALON) 100 MG capsule Take 1 capsule (100 mg total) by mouth 2 (two) times daily as needed for cough. 20 capsule 0  . Calcium Carbonate-Vitamin D (CALCIUM 600 + D PO) Take 1 tablet by mouth 2 (two) times daily.     . fluticasone (FLONASE) 50 MCG/ACT nasal spray Place 2 sprays into both nostrils daily. 16 g 6  . HYDROcodone-acetaminophen (NORCO/VICODIN) 5-325 MG tablet Take 1 tablet by mouth every 6 (six) hours as needed for moderate pain. 30 tablet 0  . ibuprofen (ADVIL,MOTRIN) 800 MG tablet Take 1 tablet (800 mg total) by mouth every 8 (eight) hours as needed. (Patient taking differently: Take 800 mg by mouth every 8 (eight) hours as needed for moderate pain. ) 90 tablet 0  . loratadine (CLARITIN) 10 MG tablet TAKE 1 TABLET BY MOUTH ONCE DAILY FOR ALLERGIES. 90 tablet 1  . montelukast (SINGULAIR) 10 MG tablet TAKE 1 TABLET BY MOUTH AT BEDTIME. 90 tablet 1  . Omega-3 Fatty Acids (FISH OIL) 1000 MG CAPS Take 2 capsules by mouth daily.    . polyethylene glycol powder (GLYCOLAX/MIRALAX) powder MIX 1 CAPFUL IN 8 OUNCES OF JUICE OR WATER AND DRINK ONCE DAILY. 527 g 5  . potassium chloride SA (K-DUR,KLOR-CON) 20 MEQ tablet Take 1 tablet (20 mEq total) by mouth daily. 90 tablet 0  . predniSONE (DELTASONE) 20 MG tablet Take 1 tablet (20 mg total) by mouth 2 (two) times daily with a meal. 10 tablet 0  . simvastatin (ZOCOR) 20 MG tablet TAKE (1) TABLET BY MOUTH DAILY AT 6 PM FOR CHOLESTEROL. 90 tablet 1  . SYMBICORT 160-4.5 MCG/ACT inhaler INHALE 2 PUFFS INTO THE LUNGS TWICE DAILY. 10.2 g 5  . tiZANidine (ZANAFLEX) 4 MG tablet TAKE ONE TABLET EVERY 8 HOURS AS NEEDED FOR MUSCLE SPASMS. 45 tablet 1   Current Facility-Administered Medications  Medication Dose Route Frequency Provider Last Rate Last Dose  . Influenza (>/= 3 years) inactive virus vaccine (FLVIRIN/FLUZONE) injection SUSP 0.5 mL  0.5 mL  Intramuscular Once [name], MD         Physical Exam  Blood pressure (!) 181/96, pulse 92, height '5\' 4"'  (1.626 m), weight 158 lb (71.7 kg).  Constitutional: overall normal hygiene, normal nutrition, well developed, normal grooming, normal body habitus. Assistive device:ankle brace  Musculoskeletal: gait and station Limp none, muscle tone and strength are normal, no tremors or atrophy is present.  .  Neurological: coordination overall normal.  Deep tendon reflex/nerve stretch intact.  Sensation normal.  Cranial nerves II-XII intact.   Skin:   Normal overall no scars, lesions, ulcers or rashes.  No psoriasis.  Psychiatric: Alert and oriented x 3.  Recent memory intact, remote memory unclear.  Normal mood and affect. Well groomed.  Good eye contact.  Cardiovascular: overall no swelling, no varicosities, no edema bilaterally, normal temperatures of the legs and arms, no clubbing, cyanosis and good capillary refill.  Lymphatic: palpation is normal.  Right ankle with no swelling, full ROM, normal gait.  NV intact.  The patient has been educated about the nature of the problem(s) and counseled on treatment options.  The patient appeared to understand what I have discussed and is in agreement with it.  Encounter Diagnoses  Name Primary?  . Strain of right ankle, initial encounter Yes  . Cigarette nicotine dependence without complication     PLAN Call if any problems.  Precautions discussed.  Continue current medications.   Return to clinic prn   Electronically Signed Sanjuana Kava, MD 6/5/20182:59 PM

## 2016-06-19 NOTE — Patient Instructions (Signed)
Steps to Quit Smoking Smoking tobacco can be bad for your health. It can also affect almost every organ in your body. Smoking puts you and people around you at risk for many serious [name]-lasting (chronic) diseases. Quitting smoking is hard, but it is one of the best things that you can do for your health. It is never too late to quit. What are the benefits of quitting smoking? When you quit smoking, you lower your risk for getting serious diseases and conditions. They can include:  Lung cancer or lung disease.  Heart disease.  Stroke.  Heart attack.  Not being able to have children (infertility).  Weak bones (osteoporosis) and broken bones (fractures).  If you have coughing, wheezing, and shortness of breath, those symptoms may get better when you quit. You may also get sick less often. If you are pregnant, quitting smoking can help to lower your chances of having a baby of low birth weight. What can I do to help me quit smoking? Talk with your doctor about what can help you quit smoking. Some things you can do (strategies) include:  Quitting smoking totally, instead of slowly cutting back how much you smoke over a period of time.  Going to in-person counseling. You are more likely to quit if you go to many counseling sessions.  Using resources and support systems, such as: ? Online chats with a counselor. ? Phone quitlines. ? Printed self-help materials. ? Support groups or group counseling. ? Text messaging programs. ? Mobile phone apps or applications.  Taking medicines. Some of these medicines may have nicotine in them. If you are pregnant or breastfeeding, do not take any medicines to quit smoking unless your doctor says it is okay. Talk with your doctor about counseling or other things that can help you.  Talk with your doctor about using more than one strategy at the same time, such as taking medicines while you are also going to in-person counseling. This can help make  quitting easier. What things can I do to make it easier to quit? Quitting smoking might feel very hard at first, but there is a lot that you can do to make it easier. Take these steps:  Talk to your family and friends. Ask them to support and encourage you.  Call phone quitlines, reach out to support groups, or work with a counselor.  Ask people who smoke to not smoke around you.  Avoid places that make you want (trigger) to smoke, such as: ? Bars. ? Parties. ? Smoke-break areas at work.  Spend time with people who do not smoke.  Lower the stress in your life. Stress can make you want to smoke. Try these things to help your stress: ? Getting regular exercise. ? Deep-breathing exercises. ? Yoga. ? Meditating. ? Doing a body scan. To do this, close your eyes, focus on one area of your body at a time from head to toe, and notice which parts of your body are tense. Try to relax the muscles in those areas.  Download or buy apps on your mobile phone or tablet that can help you stick to your quit plan. There are many free apps, such as QuitGuide from the CDC (Centers for Disease Control and Prevention). You can find more support from smokefree.gov and other websites.  This information is not intended to replace advice given to you by your health care provider. Make sure you discuss any questions you have with your health care provider. Document Released: 10/28/2008 Document   Revised: 08/30/2015 Document Reviewed: 05/18/2014 Elsevier Interactive Patient Education  2018 Elsevier Inc.  

## 2016-06-20 ENCOUNTER — Other Ambulatory Visit: Payer: Self-pay | Admitting: Family Medicine

## 2016-07-02 ENCOUNTER — Telehealth: Payer: Self-pay | Admitting: Orthopaedic Surgery

## 2016-07-02 MED ORDER — HYDROCODONE-ACETAMINOPHEN 5-325 MG PO TABS: 1.0000 | ORAL_TABLET | Freq: Four times a day (QID) | ORAL | 0 refills | Status: DC | PRN

## 2016-07-02 NOTE — Telephone Encounter (Signed)
Patient called for refill:  HYDROcodone-acetaminophen (NORCO/VICODIN) 5-325 MG tablet 30 tablet

## 2016-07-10 ENCOUNTER — Other Ambulatory Visit: Payer: Self-pay | Admitting: Family Medicine

## 2016-07-16 ENCOUNTER — Other Ambulatory Visit: Payer: Self-pay | Admitting: Family Medicine

## 2016-07-17 DIAGNOSIS — H2513 Age-related nuclear cataract, bilateral: Secondary | ICD-10-CM | POA: Diagnosis not present

## 2016-07-17 DIAGNOSIS — H524 Presbyopia: Secondary | ICD-10-CM | POA: Diagnosis not present

## 2016-07-19 ENCOUNTER — Telehealth: Payer: Self-pay | Admitting: Orthopaedic Surgery

## 2016-07-19 NOTE — Telephone Encounter (Signed)
Hydrocodone-Acetaminophen  5/325mg  Qty 25 Tablets °

## 2016-07-23 MED ORDER — HYDROCODONE-ACETAMINOPHEN 5-325 MG PO TABS: 1.0000 | ORAL_TABLET | Freq: Four times a day (QID) | ORAL | 0 refills | Status: DC | PRN

## 2016-07-25 ENCOUNTER — Other Ambulatory Visit: Payer: Self-pay | Admitting: Family Medicine

## 2016-08-01 ENCOUNTER — Telehealth: Payer: Self-pay | Admitting: Family Medicine

## 2016-08-01 MED ORDER — BENAZEPRIL HCL 40 MG PO TABS: 40.0000 mg | ORAL_TABLET | Freq: Every day | ORAL | 0 refills | Status: DC

## 2016-08-01 NOTE — Telephone Encounter (Signed)
Patient called, misplaced benazepril, has been without for 2 weeks, re sent to [facility]

## 2016-08-02 ENCOUNTER — Other Ambulatory Visit: Payer: Self-pay | Admitting: Family Medicine

## 2016-08-07 ENCOUNTER — Telehealth: Payer: Self-pay | Admitting: Orthopaedic Surgery

## 2016-08-07 NOTE — Telephone Encounter (Signed)
No more narcotics.  Take Advil, Aleve or Tylenol. 

## 2016-08-07 NOTE — Telephone Encounter (Signed)
Patient called and requested a refill on Hydrocodone/Acetaminophen 5-325  Mgs.   Qty  20  Sig: Take 1 tablet by mouth every 6 (six) hours as needed for moderate pain.

## 2016-08-09 ENCOUNTER — Encounter: Payer: Self-pay | Admitting: Orthopaedic Surgery

## 2016-08-09 ENCOUNTER — Ambulatory Visit (INDEPENDENT_AMBULATORY_CARE_PROVIDER_SITE_OTHER): Payer: Medicare Other | Admitting: Orthopaedic Surgery

## 2016-08-09 VITALS — BP 135/78 | HR 86 | Temp 98.0°F | Ht 64.0 in | Wt 160.0 lb

## 2016-08-09 DIAGNOSIS — F17210 Nicotine dependence, cigarettes, uncomplicated: Secondary | ICD-10-CM

## 2016-08-09 DIAGNOSIS — M7061 Trochanteric bursitis, right hip: Secondary | ICD-10-CM

## 2016-08-09 MED ORDER — HYDROCODONE-ACETAMINOPHEN 5-325 MG PO TABS: 1.0000 | ORAL_TABLET | Freq: Four times a day (QID) | ORAL | 0 refills | Status: DC | PRN

## 2016-08-09 NOTE — Patient Instructions (Signed)
Steps to Quit Smoking Smoking tobacco can be bad for your health. It can also affect almost every organ in your body. Smoking puts you and people around you at risk for many serious long-lasting (chronic) diseases. Quitting smoking is hard, but it is one of the best things that you can do for your health. It is never too late to quit. What are the benefits of quitting smoking? When you quit smoking, you lower your risk for getting serious diseases and conditions. They can include:  Lung cancer or lung disease.  Heart disease.  Stroke.  Heart attack.  Not being able to have children (infertility).  Weak bones (osteoporosis) and broken bones (fractures).  If you have coughing, wheezing, and shortness of breath, those symptoms may get better when you quit. You may also get sick less often. If you are pregnant, quitting smoking can help to lower your chances of having a baby of low birth weight. What can I do to help me quit smoking? Talk with your doctor about what can help you quit smoking. Some things you can do (strategies) include:  Quitting smoking totally, instead of slowly cutting back how much you smoke over a period of time.  Going to in-person counseling. You are more likely to quit if you go to many counseling sessions.  Using resources and support systems, such as: ? Online chats with a counselor. ? Phone quitlines. ? Printed self-help materials. ? Support groups or group counseling. ? Text messaging programs. ? Mobile phone apps or applications.  Taking medicines. Some of these medicines may have nicotine in them. If you are pregnant or breastfeeding, do not take any medicines to quit smoking unless your doctor says it is okay. Talk with your doctor about counseling or other things that can help you.  Talk with your doctor about using more than one strategy at the same time, such as taking medicines while you are also going to in-person counseling. This can help make  quitting easier. What things can I do to make it easier to quit? Quitting smoking might feel very hard at first, but there is a lot that you can do to make it easier. Take these steps:  Talk to your family and friends. Ask them to support and encourage you.  Call phone quitlines, reach out to support groups, or work with a counselor.  Ask people who smoke to not smoke around you.  Avoid places that make you want (trigger) to smoke, such as: ? Bars. ? Parties. ? Smoke-break areas at work.  Spend time with people who do not smoke.  Lower the stress in your life. Stress can make you want to smoke. Try these things to help your stress: ? Getting regular exercise. ? Deep-breathing exercises. ? Yoga. ? Meditating. ? Doing a body scan. To do this, close your eyes, focus on one area of your body at a time from head to toe, and notice which parts of your body are tense. Try to relax the muscles in those areas.  Download or buy apps on your mobile phone or tablet that can help you stick to your quit plan. There are many free apps, such as QuitGuide from the CDC (Centers for Disease Control and Prevention). You can find more support from smokefree.gov and other websites.  This information is not intended to replace advice given to you by your health care provider. Make sure you discuss any questions you have with your health care provider. Document Released: 10/28/2008 Document   Revised: 08/30/2015 Document Reviewed: 05/18/2014 Elsevier Interactive Patient Education  2018 Elsevier Inc.  

## 2016-08-09 NOTE — Progress Notes (Signed)
PROCEDURE NOTE:  The patient request injection, verbal consent was obtained.  The right trochanteric area of the hip was prepped appropriately after time out was performed.   Sterile technique was observed and injection of 1 cc of Depo-Medrol 40 mg with several cc's of plain xylocaine. Anesthesia was provided by ethyl chloride and a 20-gauge needle was used to inject the hip area. The injection was tolerated well.  A band aid dressing was applied.  The patient was advised to apply ice later today and tomorrow to the injection sight as needed.  She continues to smoke and is not quitting.  Encounter Diagnoses  Name Primary?  . Trochanteric bursitis of right hip Yes  . Cigarette nicotine dependence without complication    Return in six weeks.  Call if any problem.  Precautions discussed.   Electronically Signed Sanjuana Kava, MD 7/26/20181:42 PM

## 2016-08-27 ENCOUNTER — Other Ambulatory Visit: Payer: Self-pay | Admitting: Family Medicine

## 2016-09-04 ENCOUNTER — Other Ambulatory Visit: Payer: Self-pay | Admitting: Family Medicine

## 2016-09-12 ENCOUNTER — Telehealth: Payer: Self-pay | Admitting: Orthopaedic Surgery

## 2016-09-20 ENCOUNTER — Other Ambulatory Visit: Payer: Self-pay | Admitting: Family Medicine

## 2016-09-20 ENCOUNTER — Ambulatory Visit (INDEPENDENT_AMBULATORY_CARE_PROVIDER_SITE_OTHER): Payer: Medicare Other | Admitting: Orthopaedic Surgery

## 2016-09-20 ENCOUNTER — Encounter: Payer: Self-pay | Admitting: Orthopaedic Surgery

## 2016-09-20 VITALS — BP 149/74 | HR 98 | Temp 98.2°F | Ht 64.0 in | Wt 163.0 lb

## 2016-09-20 DIAGNOSIS — M7061 Trochanteric bursitis, right hip: Secondary | ICD-10-CM

## 2016-09-20 NOTE — Patient Instructions (Signed)
Steps to Quit Smoking Smoking tobacco can be bad for your health. It can also affect almost every organ in your body. Smoking puts you and people around you at risk for many serious long-lasting (chronic) diseases. Quitting smoking is hard, but it is one of the best things that you can do for your health. It is never too late to quit. What are the benefits of quitting smoking? When you quit smoking, you lower your risk for getting serious diseases and conditions. They can include:  Lung cancer or lung disease.  Heart disease.  Stroke.  Heart attack.  Not being able to have children (infertility).  Weak bones (osteoporosis) and broken bones (fractures).  If you have coughing, wheezing, and shortness of breath, those symptoms may get better when you quit. You may also get sick less often. If you are pregnant, quitting smoking can help to lower your chances of having a baby of low birth weight. What can I do to help me quit smoking? Talk with your doctor about what can help you quit smoking. Some things you can do (strategies) include:  Quitting smoking totally, instead of slowly cutting back how much you smoke over a period of time.  Going to in-person counseling. You are more likely to quit if you go to many counseling sessions.  Using resources and support systems, such as: ? Online chats with a counselor. ? Phone quitlines. ? Printed self-help materials. ? Support groups or group counseling. ? Text messaging programs. ? Mobile phone apps or applications.  Taking medicines. Some of these medicines may have nicotine in them. If you are pregnant or breastfeeding, do not take any medicines to quit smoking unless your doctor says it is okay. Talk with your doctor about counseling or other things that can help you.  Talk with your doctor about using more than one strategy at the same time, such as taking medicines while you are also going to in-person counseling. This can help make  quitting easier. What things can I do to make it easier to quit? Quitting smoking might feel very hard at first, but there is a lot that you can do to make it easier. Take these steps:  Talk to your family and friends. Ask them to support and encourage you.  Call phone quitlines, reach out to support groups, or work with a counselor.  Ask people who smoke to not smoke around you.  Avoid places that make you want (trigger) to smoke, such as: ? Bars. ? Parties. ? Smoke-break areas at work.  Spend time with people who do not smoke.  Lower the stress in your life. Stress can make you want to smoke. Try these things to help your stress: ? Getting regular exercise. ? Deep-breathing exercises. ? Yoga. ? Meditating. ? Doing a body scan. To do this, close your eyes, focus on one area of your body at a time from head to toe, and notice which parts of your body are tense. Try to relax the muscles in those areas.  Download or buy apps on your mobile phone or tablet that can help you stick to your quit plan. There are many free apps, such as QuitGuide from the CDC (Centers for Disease Control and Prevention). You can find more support from smokefree.gov and other websites.  This information is not intended to replace advice given to you by your health care provider. Make sure you discuss any questions you have with your health care provider. Document Released: 10/28/2008 Document   Revised: 08/30/2015 Document Reviewed: 05/18/2014 Elsevier Interactive Patient Education  2018 Elsevier Inc.  

## 2016-09-20 NOTE — Telephone Encounter (Signed)
Seen 5 25 18 

## 2016-09-20 NOTE — Progress Notes (Signed)
PROCEDURE NOTE:  The patient request injection, verbal consent was obtained.  The right trochanteric area of the hip was prepped appropriately after time out was performed.   Sterile technique was observed and injection of 1 cc of Depo-Medrol 40 mg with several cc's of plain xylocaine. Anesthesia was provided by ethyl chloride and a 20-gauge needle was used to inject the hip area. The injection was tolerated well.  A band aid dressing was applied.  The patient was advised to apply ice later today and tomorrow to the injection sight as needed.  Encounter Diagnosis  Name Primary?  . Trochanteric bursitis of right hip Yes   Return in two months.  Call if any problem.  Precautions discussed.   Electronically Signed Sanjuana Kava, MD 9/6/20181:43 PM

## 2016-09-24 ENCOUNTER — Other Ambulatory Visit: Payer: Self-pay | Admitting: Family Medicine

## 2016-09-24 NOTE — Telephone Encounter (Signed)
Seen [phone] 

## 2016-10-01 ENCOUNTER — Other Ambulatory Visit: Payer: Self-pay | Admitting: Family Medicine

## 2016-10-01 NOTE — Telephone Encounter (Signed)
Seen [phone] 

## 2016-10-03 ENCOUNTER — Encounter (HOSPITAL_COMMUNITY): Payer: Self-pay | Admitting: *Deleted

## 2016-10-03 ENCOUNTER — Emergency Department (HOSPITAL_COMMUNITY)
Admission: EM | Admit: 2016-10-03 | Discharge: 2016-10-03 | Disposition: A | Discharge status: Home / Self Care | Payer: Medicare Other | Attending: Emergency Medicine | Admitting: Emergency Medicine

## 2016-10-03 ENCOUNTER — Emergency Department (HOSPITAL_COMMUNITY): Payer: Medicare Other

## 2016-10-03 DIAGNOSIS — F17210 Nicotine dependence, cigarettes, uncomplicated: Secondary | ICD-10-CM | POA: Diagnosis not present

## 2016-10-03 DIAGNOSIS — R1031 Right lower quadrant pain: Secondary | ICD-10-CM | POA: Diagnosis not present

## 2016-10-03 DIAGNOSIS — R1084 Generalized abdominal pain: Secondary | ICD-10-CM | POA: Diagnosis not present

## 2016-10-03 DIAGNOSIS — E119 Type 2 diabetes mellitus without complications: Secondary | ICD-10-CM | POA: Diagnosis not present

## 2016-10-03 DIAGNOSIS — Z79899 Other long term (current) drug therapy: Secondary | ICD-10-CM | POA: Insufficient documentation

## 2016-10-03 DIAGNOSIS — Z7982 Long term (current) use of aspirin: Secondary | ICD-10-CM | POA: Insufficient documentation

## 2016-10-03 DIAGNOSIS — R109 Unspecified abdominal pain: Secondary | ICD-10-CM | POA: Diagnosis present

## 2016-10-03 DIAGNOSIS — I10 Essential (primary) hypertension: Secondary | ICD-10-CM | POA: Diagnosis not present

## 2016-10-03 DIAGNOSIS — R1011 Right upper quadrant pain: Secondary | ICD-10-CM | POA: Diagnosis not present

## 2016-10-03 DIAGNOSIS — J449 Chronic obstructive pulmonary disease, unspecified: Secondary | ICD-10-CM | POA: Diagnosis not present

## 2016-10-03 DIAGNOSIS — N200 Calculus of kidney: Secondary | ICD-10-CM | POA: Diagnosis not present

## 2016-10-03 LAB — URINALYSIS, ROUTINE W REFLEX MICROSCOPIC
BILIRUBIN URINE: NEGATIVE
Glucose, UA: NEGATIVE mg/dL
Hgb urine dipstick: NEGATIVE
Ketones, ur: NEGATIVE mg/dL
Nitrite: NEGATIVE
PH: 7 (ref 5.0–8.0)
Protein, ur: NEGATIVE mg/dL
SPECIFIC GRAVITY, URINE: 1.006 (ref 1.005–1.030)
SQUAMOUS EPITHELIAL / LPF: NONE SEEN

## 2016-10-03 LAB — CBC
HCT: 44.6 % (ref 36.0–46.0)
Hemoglobin: 14.2 g/dL (ref 12.0–15.0)
MCH: 28.9 pg (ref 26.0–34.0)
MCHC: 31.8 g/dL (ref 30.0–36.0)
MCV: 90.7 fL (ref 78.0–100.0)
PLATELETS: 370 10*3/uL (ref 150–400)
RBC: 4.92 MIL/uL (ref 3.87–5.11)
RDW: 14.2 % (ref 11.5–15.5)
WBC: 11.9 10*3/uL — AB (ref 4.0–10.5)

## 2016-10-03 LAB — COMPREHENSIVE METABOLIC PANEL
ALK PHOS: 68 U/L (ref 38–126)
ALT: 17 U/L (ref 14–54)
AST: 18 U/L (ref 15–41)
Albumin: 4.3 g/dL (ref 3.5–5.0)
Anion gap: 10 (ref 5–15)
BILIRUBIN TOTAL: 0.7 mg/dL (ref 0.3–1.2)
BUN: 16 mg/dL (ref 6–20)
CALCIUM: 9.9 mg/dL (ref 8.9–10.3)
CHLORIDE: 103 mmol/L (ref 101–111)
CO2: 26 mmol/L (ref 22–32)
CREATININE: 0.69 mg/dL (ref 0.44–1.00)
GFR calc Af Amer: >60 mL/min (ref >60)
Glucose, Bld: 96 mg/dL (ref 65–99)
Potassium: 4.5 mmol/L (ref 3.5–5.1)
Sodium: 139 mmol/L (ref 135–145)
Total Protein: 7.6 g/dL (ref 6.5–8.1)

## 2016-10-03 LAB — LIPASE, BLOOD: Lipase: 35 U/L (ref 11–51)

## 2016-10-03 MED ORDER — IOPAMIDOL (ISOVUE-300) INJECTION 61%
100.0000 mL | Freq: Once | INTRAVENOUS | Status: AC | PRN
  Administered 2016-10-03: 100 mL via INTRAVENOUS

## 2016-10-03 MED ORDER — ONDANSETRON HCL 4 MG/2ML IJ SOLN
4.0000 mg | Freq: Once | INTRAMUSCULAR | Status: AC
  Administered 2016-10-03: 4 mg via INTRAVENOUS
  Filled 2016-10-03: qty 2

## 2016-10-03 MED ORDER — MORPHINE SULFATE (PF) 4 MG/ML IV SOLN
4.0000 mg | Freq: Once | INTRAVENOUS | Status: AC
  Administered 2016-10-03: 4 mg via INTRAVENOUS
  Filled 2016-10-03: qty 1

## 2016-10-03 NOTE — ED Triage Notes (Signed)
Pt c/o right flank pain that radiates around to right lower abdomen that started yesterday. Denies n/v/d, urinary symptoms.

## 2016-10-03 NOTE — ED Provider Notes (Signed)
Cr shows no acute abnormality.  Pt eating and drinking at the time of my evaluation.   I advised tylenol.  Follow up with primary care MD for recheck in 3-4 days if symptoms persist   Sidney Ace 10/03/16 Edythe Lynn, MD 10/04/16 1147

## 2016-10-03 NOTE — ED Provider Notes (Signed)
[facility] DEPT Provider Note   CSN: [id number] Arrival date & time: 10/03/16  1210     History   Chief Complaint Chief Complaint  Patient presents with  . Flank Pain    HPI Tanya KUCHENBECKER is a 71 y.o. female with a past medical History of diabetes, COPD distant history of breast cancer and significant history of kidney stones and diverticulitis with prior partial colectomy presenting with right abdominal pain since yesterday.  She describes constant sharp pain with abdominal bloating and is worsened today.  She denies fevers, chills, vomiting but has had nausea.  She denies diarrhea or constipation, no dysuria.  She had her last bm early today which did not affect her symptoms. She has had no treatment prior to arrival.  The history is provided by the patient.    Past Medical History:  Diagnosis Date  . Breast cancer ([place]) 03/11/2012   Stage I (T1b N0 M0), grade 1 well-differentiated carcinoma of the left breast status, post lumpectomy followed by radiation therapy. Her estrogen receptor receptors were 93%, progesterone receptors 67%. HER-2/neu was negative. No lymphovascular space invasion was seen. All margins were clear. Ki-67 marker was low at 1% with surgery on 11/15/2004. Treated then with post-lumpectomy radiation, finish  . Breast cancer, left breast ([place])   . COPD (chronic obstructive pulmonary disease) ([place])   . Diabetes mellitus without complication ([place])   . Diverticula of colon   . Hyperlipidemia   . Hypertension   . Kidney stones   . Nicotine dependence   . Osteoarthritis     Patient Active Problem List   Diagnosis Date Noted  . Ankle pain, right 05/23/2016  . Allergic rhinitis 01/04/2015  . 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[place]) 09/26/2014  . Hydrosalpinx 09/28/2013  . Osteopenia 07/15/2013  . postmenopausal ovarian cyst 04/06/2013  . Pulmonary nodules 04/04/2012  . Breast cancer ([place]) 03/11/2012  . Diabetes mellitus type 2, diet-controlled ([place]) 09/13/2011  . Ganglion  cyst 05/08/2011  . COPD (chronic obstructive pulmonary disease) with chronic bronchitis ([place]) 07/24/2009  . Hyperlipidemia LDL goal <100 03/09/2009  . CONSTIPATION, CHRONIC 03/09/2009  . NICOTINE ADDICTION 10/10/2008  . Hypertension goal BP (blood pressure) < 130/80 09/29/2008  . GENERALIZED OSTEOARTHROSIS UNSPECIFIED SITE 09/29/2008    Past Surgical History:  Procedure Laterality Date  . ABDOMINAL HYSTERECTOMY    . BREAST SURGERY  2005 approx   left lumpectomy   . CHOLECYSTECTOMY N/A 05/26/2014   Procedure: LAPAROSCOPIC CHOLECYSTECTOMY;  Surgeon: Aviva Signs Md, MD;  Location: [facility];  Service: General;  Laterality: N/A;  . COLECTOMY     2005, diverticulitis  . DILATION AND CURETTAGE OF UTERUS    . left breast      cancer, in 2006  . TUBAL LIGATION      OB History    Gravida Para Term Preterm AB Living   _0 SAB TAB Ectopic Multiple Live Births   3               Home Medications    Prior to Admission medications   Medication Sig Start Date End Date Taking? Authorizing Provider  acetaminophen (TYLENOL) 500 MG tablet Take 500-1,000 mg by mouth every 6 (six) hours as needed for moderate pain.     [provider]  albuterol (PROVENTIL) (2.5 MG/3ML) 0.083% nebulizer solution USE 1 VIAL IN NEBULIZER 3 TIMES DAILY. 08/11/13   [name], MD  amLODipine (NORVASC) 5 MG tablet TAKE 1 TABLET BY  MOUTH ONCE A DAY. 09/04/16   [name], MD  aspirin EC 81 MG tablet Take 81 mg by mouth daily.    [provider]  ATROVENT HFA 17 MCG/ACT inhaler INHALE 2 PUFFS BY MOUTH FOUR TIMES A DAY. 10/01/16   [name], MD  azelastine (ASTELIN) 0.1 % nasal spray PLACE 2 SPRAYS INTO EACH NOSTRIL TWICE DAILY. 03/14/16   [name], MD  azithromycin (ZITHROMAX) 250 MG tablet Two tablets on day one, then one tablet once daily for an additional four days 06/13/16   [name], MD  benazepril (LOTENSIN) 40 MG tablet Take 1 tablet (40 mg  total) by mouth daily. 08/01/16   [name], MD  benzonatate (TESSALON) 100 MG capsule Take 1 capsule (100 mg total) by mouth 2 (two) times daily as needed for cough. 06/13/16   [name], MD  Calcium Carbonate-Vitamin D (CALCIUM 600 + D PO) Take 1 tablet by mouth 2 (two) times daily.     [provider]  fluticasone (FLONASE) 50 MCG/ACT nasal spray Place 2 sprays into both nostrils daily. 06/08/16   Raylene Everts, MD  HYDROcodone-acetaminophen (NORCO/VICODIN) 5-325 MG tablet Take 1 tablet by mouth every 6 (six) hours as needed for moderate pain. 08/09/16   Sanjuana Kava, MD  ibuprofen (ADVIL,MOTRIN) 800 MG tablet Take 1 tablet (800 mg total) by mouth every 8 (eight) hours as needed. Patient taking differently: Take 800 mg by mouth every 8 (eight) hours as needed for moderate pain.  03/27/16   Sanjuana Kava, MD  loratadine (CLARITIN) 10 MG tablet TAKE 1 TABLET BY MOUTH ONCE DAILY FOR ALLERGIES. 06/05/16   [name], MD  montelukast (SINGULAIR) 10 MG tablet TAKE 1 TABLET BY MOUTH AT BEDTIME. 07/10/16   [name], MD  Omega-3 Fatty Acids (FISH OIL) 1000 MG CAPS Take 2 capsules by mouth daily.    [provider]  polyethylene glycol powder (GLYCOLAX/MIRALAX) powder MIX 1 CAPFUL IN 8 OUNCES OF JUICE OR WATER AND DRINK ONCE DAILY. 04/23/16   [name], MD  potassium chloride SA (K-DUR,KLOR-CON) 20 MEQ tablet TAKE 1 TABLET BY MOUTH DAILY. 06/20/16   [name], MD  predniSONE (DELTASONE) 20 MG tablet Take 1 tablet (20 mg total) by mouth 2 (two) times daily with a meal. 06/08/16   Raylene Everts, MD  simvastatin (ZOCOR) 20 MG tablet TAKE (1) TABLET BY MOUTH DAILY AT 6 PM FOR CHOLESTEROL. 09/20/16   [name], MD  SYMBICORT 160-4.5 MCG/ACT inhaler INHALE 2 PUFFS INTO THE LUNGS TWICE DAILY. 10/01/16   [name], MD  tiZANidine (ZANAFLEX) 4 MG tablet TAKE ONE TABLET EVERY 8 HOURS AS NEEDED FOR MUSCLE SPASMS. 09/24/16    [name], MD  traMADol (ULTRAM) 50 MG tablet TAKE (1) TABLET BY MOUTH EVERY SIX HOURS AS NEEDED. 09/12/16   Sanjuana Kava, MD    Family History Family History  Problem Relation Age of Onset  . Cancer Mother        breast  . COPD Father   . Throat cancer Sister   . Schizophrenia Brother   . Heart attack Brother   . Lung cancer Sister        former smoker    Social History Social History  Substance Use Topics  . Smoking status: Current Every Day Smoker    Packs/day: 0.50    Years: 50.00    Types: Cigarettes  . Smokeless tobacco: Never Used  Comment: cigaretes  now 10 to 15 per day  . Alcohol use No     Allergies   Penicillins and Sulfonamide derivatives   Review of Systems Review of Systems  Constitutional: Negative for chills and fever.  HENT: Negative.   Eyes: Negative.   Respiratory: Negative for chest tightness and shortness of breath.   Cardiovascular: Negative for chest pain.  Gastrointestinal: Positive for abdominal distention, abdominal pain and nausea. Negative for blood in stool, constipation, diarrhea and vomiting.  Genitourinary: Negative.  Negative for dysuria.  Musculoskeletal: Negative for arthralgias, joint swelling and neck pain.  Skin: Negative.  Negative for rash and wound.  Neurological: Negative for dizziness, weakness, light-headedness, numbness and headaches.  Psychiatric/Behavioral: Negative.      Physical Exam Updated Vital Signs BP (!) 180/86 (BP Location: Right Arm)   Pulse 73   Temp 98.2 F (36.8 C) (Oral)   Resp 16   Ht _0  (1.626 m)   Wt 75.8 kg (167 lb)   SpO2 100%   BMI 28.67 kg/m   Physical Exam  Constitutional: She appears well-developed and well-nourished.  HENT:  Head: Normocephalic and atraumatic.  Eyes: Conjunctivae are normal.  Neck: Normal range of motion.  Cardiovascular: Normal rate, regular rhythm, normal heart sounds and intact distal pulses.   Pulmonary/Chest: Effort normal and breath  sounds normal. She has no wheezes.  Abdominal: Soft. She exhibits distension. She exhibits no mass. Bowel sounds are increased. There is tenderness in the right upper quadrant, right lower quadrant and periumbilical area. There is no rigidity and no guarding.  Musculoskeletal: Normal range of motion.  Neurological: She is alert.  Skin: Skin is warm and dry.  Psychiatric: She has a normal mood and affect.  Nursing note and vitals reviewed.    ED Treatments / Results  Labs (all labs ordered are listed, but only abnormal results are displayed) Labs Reviewed  CBC - Abnormal; Notable for the following:       Result Value   WBC 11.9 (*)    All other components within normal limits  URINALYSIS, ROUTINE W REFLEX MICROSCOPIC - Abnormal; Notable for the following:    Color, Urine STRAW (*)    APPearance HAZY (*)    Leukocytes, UA SMALL (*)    Bacteria, UA RARE (*)    All other components within normal limits  LIPASE, BLOOD  COMPREHENSIVE METABOLIC PANEL    EKG  EKG Interpretation None       Radiology No results found.  Procedures Procedures (including critical care time)  Medications Ordered in ED Medications  morphine 4 MG/ML injection 4 mg (4 mg Intravenous Given 10/03/16 1600)  ondansetron (ZOFRAN) injection 4 mg (4 mg Intravenous Given 10/03/16 1601)     Initial Impression / Assessment and Plan / ED Course  I have reviewed the triage vital signs and the nursing notes.  Pertinent labs & imaging results that were available during my care of the patient were reviewed by me and considered in my medical decision making (see chart for details).     Labs normal except leukocytosis. Pending CT scan. Discussed with Alyse Low, PA who assumes care.  Final Clinical Impressions(s) / ED Diagnoses   Final diagnoses:  None    New Prescriptions New Prescriptions   No medications on file     Landis Martins 10/03/16 1734    Isla Pence, MD 10/04/16 1300

## 2016-10-03 NOTE — Discharge Instructions (Signed)
Return if any problems.  See your Physicain for recheck in 3-4 days ifpain persist

## 2016-10-15 ENCOUNTER — Other Ambulatory Visit: Payer: Self-pay | Admitting: Family Medicine

## 2016-10-24 ENCOUNTER — Ambulatory Visit (INDEPENDENT_AMBULATORY_CARE_PROVIDER_SITE_OTHER): Payer: Medicare Other | Admitting: Family Medicine

## 2016-10-24 ENCOUNTER — Encounter: Payer: Self-pay | Admitting: Family Medicine

## 2016-10-24 VITALS — BP 130/78 | HR 97 | Temp 98.2°F | Resp 16 | Ht 64.0 in | Wt 160.2 lb

## 2016-10-24 DIAGNOSIS — F17210 Nicotine dependence, cigarettes, uncomplicated: Secondary | ICD-10-CM

## 2016-10-24 DIAGNOSIS — J209 Acute bronchitis, unspecified: Secondary | ICD-10-CM | POA: Diagnosis not present

## 2016-10-24 DIAGNOSIS — J440 Chronic obstructive pulmonary disease with acute lower respiratory infection: Secondary | ICD-10-CM

## 2016-10-24 DIAGNOSIS — E785 Hyperlipidemia, unspecified: Secondary | ICD-10-CM | POA: Diagnosis not present

## 2016-10-24 DIAGNOSIS — J0100 Acute maxillary sinusitis, unspecified: Secondary | ICD-10-CM

## 2016-10-24 DIAGNOSIS — I10 Essential (primary) hypertension: Secondary | ICD-10-CM | POA: Diagnosis not present

## 2016-10-24 DIAGNOSIS — F17200 Nicotine dependence, unspecified, uncomplicated: Secondary | ICD-10-CM

## 2016-10-24 MED ORDER — AZITHROMYCIN 250 MG PO TABS: ORAL_TABLET | ORAL | 0 refills | Status: DC

## 2016-10-24 MED ORDER — PREDNISONE 5 MG (21) PO TBPK: 5.0000 mg | ORAL_TABLET | ORAL | 0 refills | Status: DC

## 2016-10-24 MED ORDER — BENZONATATE 100 MG PO CAPS: 100.0000 mg | ORAL_CAPSULE | Freq: Four times a day (QID) | ORAL | 0 refills | Status: DC | PRN

## 2016-10-24 NOTE — Progress Notes (Signed)
[name]     MRN: [id number]      DOB: [birth year]   HPI [name]  4 day h/o worsening head and chest congestion, associated with fever and chills intermittently. Nasal drainage has thickened , and is yellowish green, and at times bloody. Sputum is thick and yellow. No improvement with OTC medication.    ROS Denies chest pains, palpitations and leg swelling Denies abdominal pain, nausea, vomiting,diarrhea or constipation.   Denies dysuria, frequency, hesitancy or incontinence. Denies uncontrolled joint pain, swelling and limitation in mobility. Denies headaches, seizures, numbness, or tingling. Denies depression, anxiety or insomnia. Denies skin break down or rash.   PE  BP 130/78 (BP Location: Right Arm, Patient Position: Sitting, Cuff Size: Normal)   Pulse 97   Temp 98.2 F (36.8 C) (Other (Comment))   Resp 16   Ht 5\' 4"  (1.626 m)   Wt 160 lb 4 oz (72.7 kg)   SpO2 96%   BMI 27.51 kg/m   Patient alert and oriented and in no cardiopulmonary distress.  HEENT: No facial asymmetry, EOMI,   oropharynx pink and moist.  Neck supple no JVD, no mass.Maxillary sinus tenderness, and anterior cervical adenopathy Chest: decreased air entry Blatella crackles and wheezes.  CVS: S1, S2 no murmurs, no S3.Regular rate.  ABD: Soft non tender.   Ext: No edema  MS: Adequate ROM spine, shoulders, hips and knees.  Skin: Intact, no ulcerations or rash noted.  Psych: Good eye contact, normal affect. Memory intact not anxious or depressed appearing.  CNS: CN 2-12 intact, power,  normal throughout.no focal deficits noted.   Assessment & Plan  Acute maxillary sinusitis, unspecified z pack prescribed and daily use of allergy medication stressed  Acute bronchitis with COPD ([place]) z pack and tessalon perles prescribed  Hypertension goal BP (blood pressure) < 130/80 Controlled, no change in medication DASH diet and commitment to daily physical activity for a minimum of 30  minutes discussed and encouraged, as a part of hypertension management. The importance of attaining a healthy weight is also discussed.  BP/Weight 10/24/2016 10/03/2016 09/20/2016 08/09/2016 06/19/2016 06/08/2016 9/93/7169  Systolic BP 678 938 101 751 025 852 778  Diastolic BP 78 78 74 78 96 70 87  Wt. (Lbs) 160.25 167 163 160 158 159.04 161  BMI 27.51 28.67 27.98 27.46 27.12 27.3 27.64       NICOTINE ADDICTION Patient is asked and  confirms current  Nicotine use.  Five to seven minutes of time is spent in counseling the patient of the need to quit smoking  Advice to quit is delivered clearly specifically in reducing the risk of developing heart disease, having a stroke, or of developing all types of cancer, especially lung and oral cancer. Improvement in breathing and exercise tolerance and quality of life is also discussed, as is the economic benefit.  Assessment of willingness to quit or to make an attempt to quit is made and documented  Assistance in quit attempt is made with several and varied options presented, based on patient's desire and need. These include  literature, local classes available, 1800 QUIT NOW number, OTC and prescription medication.  The GOAL to be NICOTINE FREE is re emphasized.  The patient has set a personal goal of either reduction or discontinuation and follow up is arranged between 6 an 16 weeks.    Hyperlipidemia LDL goal <100 Hyperlipidemia:Low fat diet discussed and encouraged.   Lipid Panel  Lab Results  Component Value Date   CHOL  165 01/18/2016   HDL 73 01/18/2016   LDLCALC 68 01/18/2016   TRIG 122 01/18/2016   CHOLHDL 2.3 01/18/2016     Updated lab needed at/ before next visit.

## 2016-10-24 NOTE — Assessment & Plan Note (Signed)
Controlled, no change in medication DASH diet and commitment to daily physical activity for a minimum of 30 minutes discussed and encouraged, as a part of hypertension management. The importance of attaining a healthy weight is also discussed.  BP/Weight 10/24/2016 10/03/2016 09/20/2016 08/09/2016 06/19/2016 06/08/2016 1/66/0600  Systolic BP 459 977 414 239 532 023 343  Diastolic BP 78 78 74 78 96 70 87  Wt. (Lbs) 160.25 167 163 160 158 159.04 161  BMI 27.51 28.67 27.98 27.46 27.12 27.3 27.64

## 2016-10-24 NOTE — Assessment & Plan Note (Signed)
Hyperlipidemia:Low fat diet discussed and encouraged.   Lipid Panel  Lab Results  Component Value Date   CHOL 165 01/18/2016   HDL 73 01/18/2016   LDLCALC 68 01/18/2016   TRIG 122 01/18/2016   CHOLHDL 2.3 01/18/2016     Updated lab needed at/ before next visit.

## 2016-10-24 NOTE — Assessment & Plan Note (Signed)

## 2016-10-24 NOTE — Assessment & Plan Note (Signed)
z pack prescribed and daily use of allergy medication stressed

## 2016-10-24 NOTE — Assessment & Plan Note (Signed)
z pack and tessalon perles prescribed ?

## 2016-10-24 NOTE — Patient Instructions (Addendum)
Keep wellness visit as before  Please get fasting lipid panel for the visit ,order will be faxed to lab next door  Three medications sent for sinuitis and bronchitis today  Please work on stopping smoking     Steps to Quit Smoking Smoking tobacco can be bad for your health. It can also affect almost every organ in your body. Smoking puts you and people around you at risk for many serious long-lasting (chronic) diseases. Quitting smoking is hard, but it is one of the best things that you can do for your health. It is never too late to quit. What are the benefits of quitting smoking? When you quit smoking, you lower your risk for getting serious diseases and conditions. They can include:  Lung cancer or lung disease.  Heart disease.  Stroke.  Heart attack.  Not being able to have children (infertility).  Weak bones (osteoporosis) and broken bones (fractures).  If you have coughing, wheezing, and shortness of breath, those symptoms may get better when you quit. You may also get sick less often. If you are pregnant, quitting smoking can help to lower your chances of having a baby of low birth weight. What can I do to help me quit smoking? Talk with your doctor about what can help you quit smoking. Some things you can do (strategies) include:  Quitting smoking totally, instead of slowly cutting back how much you smoke over a period of time.  Going to in-person counseling. You are more likely to quit if you go to many counseling sessions.  Using resources and support systems, such as: ? Database administrator with a Social worker. ? Phone quitlines. ? Careers information officer. ? Support groups or group counseling. ? Text messaging programs. ? Mobile phone apps or applications.  Taking medicines. Some of these medicines may have nicotine in them. If you are pregnant or breastfeeding, do not take any medicines to quit smoking unless your doctor says it is okay. Talk with your doctor about  counseling or other things that can help you.  Talk with your doctor about using more than one strategy at the same time, such as taking medicines while you are also going to in-person counseling. This can help make quitting easier. What things can I do to make it easier to quit? Quitting smoking might feel very hard at first, but there is a lot that you can do to make it easier. Take these steps:  Talk to your family and friends. Ask them to support and encourage you.  Call phone quitlines, reach out to support groups, or work with a Social worker.  Ask people who smoke to not smoke around you.  Avoid places that make you want (trigger) to smoke, such as: ? Bars. ? Parties. ? Smoke-break areas at work.  Spend time with people who do not smoke.  Lower the stress in your life. Stress can make you want to smoke. Try these things to help your stress: ? Getting regular exercise. ? Deep-breathing exercises. ? Yoga. ? Meditating. ? Doing a body scan. To do this, close your eyes, focus on one area of your body at a time from head to toe, and notice which parts of your body are tense. Try to relax the muscles in those areas.  Download or buy apps on your mobile phone or tablet that can help you stick to your quit plan. There are many free apps, such as QuitGuide from the [employer] Office manager for Disease Control and Prevention). You can find  more support from smokefree.gov and other websites.  This information is not intended to replace advice given to you by your health care provider. Make sure you discuss any questions you have with your health care provider. Document Released: 10/28/2008 Document Revised: 08/30/2015 Document Reviewed: 05/18/2014 Elsevier Interactive Patient Education  2018 [employer].

## 2016-11-01 ENCOUNTER — Encounter: Payer: Self-pay | Admitting: Orthopaedic Surgery

## 2016-11-02 ENCOUNTER — Other Ambulatory Visit: Payer: Self-pay | Admitting: Family Medicine

## 2016-11-02 DIAGNOSIS — E785 Hyperlipidemia, unspecified: Secondary | ICD-10-CM

## 2016-11-12 ENCOUNTER — Emergency Department (HOSPITAL_COMMUNITY): Payer: Medicare Other

## 2016-11-12 ENCOUNTER — Ambulatory Visit (INDEPENDENT_AMBULATORY_CARE_PROVIDER_SITE_OTHER): Payer: Medicare Other

## 2016-11-12 ENCOUNTER — Encounter (HOSPITAL_COMMUNITY): Payer: Self-pay | Admitting: Emergency Medicine

## 2016-11-12 ENCOUNTER — Telehealth: Payer: Self-pay | Admitting: *Deleted

## 2016-11-12 ENCOUNTER — Emergency Department (HOSPITAL_COMMUNITY)
Admission: EM | Admit: 2016-11-12 | Discharge: 2016-11-12 | Disposition: A | Discharge status: Home / Self Care | Payer: Medicare Other | Attending: Emergency Medicine | Admitting: Emergency Medicine

## 2016-11-12 VITALS — BP 150/80 | HR 93 | Temp 99.1°F | Ht 64.0 in | Wt 156.1 lb

## 2016-11-12 DIAGNOSIS — R079 Chest pain, unspecified: Secondary | ICD-10-CM | POA: Insufficient documentation

## 2016-11-12 DIAGNOSIS — Z0000 Encounter for general adult medical examination without abnormal findings: Secondary | ICD-10-CM

## 2016-11-12 DIAGNOSIS — Z5321 Procedure and treatment not carried out due to patient leaving prior to being seen by health care provider: Secondary | ICD-10-CM | POA: Insufficient documentation

## 2016-11-12 DIAGNOSIS — R05 Cough: Secondary | ICD-10-CM | POA: Diagnosis not present

## 2016-11-12 NOTE — ED Triage Notes (Signed)
Chest pain/productive cough and sore throat since Friday. Pt states daughter is sick with same.

## 2016-11-12 NOTE — ED Notes (Signed)
Pt reported to Registration that she called her PCP office and told them to call her results of xray. Pt does not wish to wait any longer. Pt informed of right to stay and be evaluated. Pt still decided to leave.

## 2016-11-12 NOTE — Telephone Encounter (Signed)
Patient was sent back to the ED for transportation voucher.

## 2016-11-12 NOTE — Patient Instructions (Addendum)
[name] , Thank you for taking time to come for your Medicare Wellness Visit. I appreciate your ongoing commitment to your health goals. Please review the following plan we discussed and let me know if I can assist you in the future.   Screening recommendations/referrals: Colonoscopy: Up to date, next due 06/2020 Mammogram: Up to date, next due 01/2017 Bone Density: Up to date Diabetic Eye Exam: Due Recommended yearly dental visit for hygiene and checkup  Vaccinations: Influenza vaccine: Administered today Pneumococcal vaccine: Up to date Tdap vaccine: Up to date, next due 01/2022 Shingles vaccine: Due, declines    Advanced directives: Advance directive discussed with you today. I have provided a copy for you to complete at home and have notarized. Once this is complete please bring a copy in to our office so we can scan it into your chart.  Conditions/risks identified: Pre-obese, recommend starting a routine exercise program at least 3 days a week for 30-45 minutes at a time as tolerated. At risk for developing lung cancer. Dr. Meda Coffee recommends evaluation in the ED today for high BP and chest pain.  Next appointment: Follow up in 1 year for your annual wellness visit.   Preventive Care 27 Years and Older, Female Preventive care refers to lifestyle choices and visits with your health care provider that can promote health and wellness. What does preventive care include?  A yearly physical exam. This is also called an annual well check.  Dental exams once or twice a year.  Routine eye exams. Ask your health care provider how often you should have your eyes checked.  Personal lifestyle choices, including:  Daily care of your teeth and gums.  Regular physical activity.  Eating a healthy diet.  Avoiding tobacco and drug use.  Limiting alcohol use.  Practicing safe sex.  Taking low-dose aspirin every day.  Taking vitamin and mineral supplements as recommended by your health  care provider. What happens during an annual well check? The services and screenings done by your health care provider during your annual well check will depend on your age, overall health, lifestyle risk factors, and family history of disease. Counseling  Your health care provider may ask you questions about your:  Alcohol use.  Tobacco use.  Drug use.  Emotional well-being.  Home and relationship well-being.  Sexual activity.  Eating habits.  History of falls.  Memory and ability to understand (cognition).  Work and work Statistician.  Reproductive health. Screening  You may have the following tests or measurements:  Height, weight, and BMI.  Blood pressure.  Lipid and cholesterol levels. These may be checked every 5 years, or more frequently if you are over 18 years old.  Skin check.  Lung cancer screening. You may have this screening every year starting at age 42 if you have a 30-pack-year history of smoking and currently smoke or have quit within the past 15 years.  Fecal occult blood test (FOBT) of the stool. You may have this test every year starting at age 42.  Flexible sigmoidoscopy or colonoscopy. You may have a sigmoidoscopy every 5 years or a colonoscopy every 10 years starting at age 59.  Hepatitis C blood test.  Hepatitis B blood test.  Sexually transmitted disease (STD) testing.  Diabetes screening. This is done by checking your blood sugar (glucose) after you have not eaten for a while (fasting). You may have this done every 1-3 years.  Bone density scan. This is done to screen for osteoporosis. You may have  this done starting at age 85.  Mammogram. This may be done every 1-2 years. Talk to your health care provider about how often you should have regular mammograms. Talk with your health care provider about your test results, treatment options, and if necessary, the need for more tests. Vaccines  Your health care provider may recommend certain  vaccines, such as:  Influenza vaccine. This is recommended every year.  Tetanus, diphtheria, and acellular pertussis (Tdap, Td) vaccine. You may need a Td booster every 10 years.  Zoster vaccine. You may need this after age 34.  Pneumococcal 13-valent conjugate (PCV13) vaccine. One dose is recommended after age 94.  Pneumococcal polysaccharide (PPSV23) vaccine. One dose is recommended after age 11. Talk to your health care provider about which screenings and vaccines you need and how often you need them. This information is not intended to replace advice given to you by your health care provider. Make sure you discuss any questions you have with your health care provider. Document Released: 01/28/2015 Document Revised: 09/21/2015 Document Reviewed: 11/02/2014 Elsevier Interactive Patient Education  2017 [place] Prevention in the Home Falls can cause injuries. They can happen to people of all ages. There are many things you can do to make your home safe and to help prevent falls. What can I do on the outside of my home?  Regularly fix the edges of walkways and driveways and fix any cracks.  Remove anything that might make you trip as you walk through a door, such as a raised step or threshold.  Trim any bushes or trees on the path to your home.  Use bright outdoor lighting.  Clear any walking paths of anything that might make someone trip, such as rocks or tools.  Regularly check to see if handrails are loose or broken. Make sure that both sides of any steps have handrails.  Any raised decks and porches should have guardrails on the edges.  Have any leaves, snow, or ice cleared regularly.  Use sand or salt on walking paths during winter.  Clean up any spills in your garage right away. This includes oil or grease spills. What can I do in the bathroom?  Use night lights.  Install grab bars by the toilet and in the tub and shower. Do not use towel bars as grab  bars.  Use non-skid mats or decals in the tub or shower.  If you need to sit down in the shower, use a plastic, non-slip stool.  Keep the floor dry. Clean up any water that spills on the floor as soon as it happens.  Remove soap buildup in the tub or shower regularly.  Attach bath mats securely with double-sided non-slip rug tape.  Do not have throw rugs and other things on the floor that can make you trip. What can I do in the bedroom?  Use night lights.  Make sure that you have a light by your bed that is easy to reach.  Do not use any sheets or blankets that are too big for your bed. They should not hang down onto the floor.  Have a firm chair that has side arms. You can use this for support while you get dressed.  Do not have throw rugs and other things on the floor that can make you trip. What can I do in the kitchen?  Clean up any spills right away.  Avoid walking on wet floors.  Keep items that you use a lot in easy-to-reach  places.  If you need to reach something above you, use a strong step stool that has a grab bar.  Keep electrical cords out of the way.  Do not use floor polish or wax that makes floors slippery. If you must use wax, use non-skid floor wax.  Do not have throw rugs and other things on the floor that can make you trip. What can I do with my stairs?  Do not leave any items on the stairs.  Make sure that there are handrails on both sides of the stairs and use them. Fix handrails that are broken or loose. Make sure that handrails are as long as the stairways.  Check any carpeting to make sure that it is firmly attached to the stairs. Fix any carpet that is loose or worn.  Avoid having throw rugs at the top or bottom of the stairs. If you do have throw rugs, attach them to the floor with carpet tape.  Make sure that you have a light switch at the top of the stairs and the bottom of the stairs. If you do not have them, ask someone to add them for  you. What else can I do to help prevent falls?  Wear shoes that:  Do not have high heels.  Have rubber bottoms.  Are comfortable and fit you well.  Are closed at the toe. Do not wear sandals.  If you use a stepladder:  Make sure that it is fully opened. Do not climb a closed stepladder.  Make sure that both sides of the stepladder are locked into place.  Ask someone to hold it for you, if possible.  Clearly mark and make sure that you can see:  Any grab bars or handrails.  First and last steps.  Where the edge of each step is.  Use tools that help you move around (mobility aids) if they are needed. These include:  Canes.  Walkers.  Scooters.  Crutches.  Turn on the lights when you go into a dark area. Replace any light bulbs as soon as they burn out.  Set up your furniture so you have a clear path. Avoid moving your furniture around.  If any of your floors are uneven, fix them.  If there are any pets around you, be aware of where they are.  Review your medicines with your doctor. Some medicines can make you feel dizzy. This can increase your chance of falling. Ask your doctor what other things that you can do to help prevent falls. This information is not intended to replace advice given to you by your health care provider. Make sure you discuss any questions you have with your health care provider. Document Released: 10/28/2008 Document Revised: 06/09/2015 Document Reviewed: 02/05/2014 Elsevier Interactive Patient Education  2017 [employer].   Steps to Quit Smoking Smoking tobacco can be bad for your health. It can also affect almost every organ in your body. Smoking puts you and people around you at risk for many serious long-lasting (chronic) diseases. Quitting smoking is hard, but it is one of the best things that you can do for your health. It is never too late to quit. What are the benefits of quitting smoking? When you quit smoking, you lower your  risk for getting serious diseases and conditions. They can include:  Lung cancer or lung disease.  Heart disease.  Stroke.  Heart attack.  Not being able to have children (infertility).  Weak bones (osteoporosis) and broken bones (fractures).  If you have coughing, wheezing, and shortness of breath, those symptoms may get better when you quit. You may also get sick less often. If you are pregnant, quitting smoking can help to lower your chances of having a baby of low birth weight. What can I do to help me quit smoking? Talk with your doctor about what can help you quit smoking. Some things you can do (strategies) include:  Quitting smoking totally, instead of slowly cutting back how much you smoke over a period of time.  Going to in-person counseling. You are more likely to quit if you go to many counseling sessions.  Using resources and support systems, such as: ? Database administrator with a Social worker. ? Phone quitlines. ? Careers information officer. ? Support groups or group counseling. ? Text messaging programs. ? Mobile phone apps or applications.  Taking medicines. Some of these medicines may have nicotine in them. If you are pregnant or breastfeeding, do not take any medicines to quit smoking unless your doctor says it is okay. Talk with your doctor about counseling or other things that can help you.  Talk with your doctor about using more than one strategy at the same time, such as taking medicines while you are also going to in-person counseling. This can help make quitting easier. What things can I do to make it easier to quit? Quitting smoking might feel very hard at first, but there is a lot that you can do to make it easier. Take these steps:  Talk to your family and friends. Ask them to support and encourage you.  Call phone quitlines, reach out to support groups, or work with a Social worker.  Ask people who smoke to not smoke around you.  Avoid places that make you want  (trigger) to smoke, such as: ? Bars. ? Parties. ? Smoke-break areas at work.  Spend time with people who do not smoke.  Lower the stress in your life. Stress can make you want to smoke. Try these things to help your stress: ? Getting regular exercise. ? Deep-breathing exercises. ? Yoga. ? Meditating. ? Doing a body scan. To do this, close your eyes, focus on one area of your body at a time from head to toe, and notice which parts of your body are tense. Try to relax the muscles in those areas.  Download or buy apps on your mobile phone or tablet that can help you stick to your quit plan. There are many free apps, such as QuitGuide from the [employer] Office manager for Disease Control and Prevention). You can find more support from smokefree.gov and other websites.  This information is not intended to replace advice given to you by your health care provider. Make sure you discuss any questions you have with your health care provider. Document Released: 10/28/2008 Document Revised: 08/30/2015 Document Reviewed: 05/18/2014 Elsevier Interactive Patient Education  2018 [employer].

## 2016-11-12 NOTE — Progress Notes (Signed)
Subjective:   [name] is a 71 y.o. female who presents for Medicare Annual (Subsequent) preventive examination.  Review of Systems:    Cardiac Risk Factors include: advanced age (>40mn, >>69women);diabetes mellitus;dyslipidemia;hypertension;sedentary lifestyle;smoking/ tobacco exposure     Objective:     Vitals: BP (!) 150/80   Pulse 93   Temp 99.1 F (37.3 C) (Temporal)   Ht '5\' 4"'  (1.626 m)   Wt 156 lb 1.3 oz (70.8 kg)   SpO2 93%   BMI 26.79 kg/m   Body mass index is 26.79 kg/m.   Tobacco History  Smoking Status  . Current Every Day Smoker  . Packs/day: 1.00  . Years: 50.00  . Types: Cigarettes  Smokeless Tobacco  . Never Used    Comment: cigaretes  now 10 to 15 per day     Ready to quit: Not Answered Counseling given: Not Answered   Past Medical History:  Diagnosis Date  . Breast cancer ([facility] 03/11/2012   Stage I (T1b N0 M0), grade 1 well-differentiated carcinoma of the left breast status, post lumpectomy followed by radiation therapy. Her estrogen receptor receptors were 93%, progesterone receptors 67%. HER-2/neu was negative. No lymphovascular space invasion was seen. All margins were clear. Ki-67 marker was low at 1% with surgery on 11/15/2004. Treated then with post-lumpectomy radiation, finish  . Breast cancer, left breast ([facility]   . COPD (chronic obstructive pulmonary disease) ([facility]   . Diabetes mellitus without complication ([facility]   . Diverticula of colon   . Hyperlipidemia   . Hypertension   . Kidney stones   . Nicotine dependence   . Osteoarthritis    Past Surgical History:  Procedure Laterality Date  . ABDOMINAL HYSTERECTOMY    . BREAST SURGERY  2005 approx   left lumpectomy   . CHOLECYSTECTOMY N/A 05/26/2014   Procedure: LAPAROSCOPIC CHOLECYSTECTOMY;  Surgeon: MAviva SignsMd, MD;  Location: [facility];  Service: General;  Laterality: N/A;  . COLECTOMY     2005, diverticulitis  . DILATION AND CURETTAGE OF UTERUS    . left breast      cancer, in 2006  . TUBAL LIGATION     Family History  Problem Relation Age of Onset  . Breast cancer Mother   . COPD Father   . Emphysema Father   . Throat cancer Sister   . Glaucoma Brother   . Schizophrenia Brother   . Heart attack Brother   . Lung cancer Sister        former smoker  . Hepatitis C Daughter   . Seizures Daughter   . SIDS Son    History  Sexual Activity  . Sexual activity: No    Outpatient Encounter Prescriptions as of 11/12/2016  Medication Sig  . acetaminophen (TYLENOL) 500 MG tablet Take 500-1,000 mg by mouth every 6 (six) hours as needed for moderate pain.   .Marland Kitchenalbuterol (PROVENTIL) (2.5 MG/3ML) 0.083% nebulizer solution USE 1 VIAL IN NEBULIZER 3 TIMES DAILY.  .Marland KitchenamLODipine (NORVASC) 5 MG tablet TAKE 1 TABLET BY MOUTH ONCE A DAY.  .Marland Kitchenaspirin EC 81 MG tablet Take 81 mg by mouth daily.  . ATROVENT HFA 17 MCG/ACT inhaler INHALE 2 PUFFS BY MOUTH FOUR TIMES A DAY.  .Marland Kitchenazelastine (ASTELIN) 0.1 % nasal spray PLACE 2 SPRAYS INTO EACH NOSTRIL TWICE DAILY.  . benazepril (LOTENSIN) 40 MG tablet Take 1 tablet (40 mg total) by mouth daily.  . Calcium Carbonate-Vitamin D (CALCIUM 600 + D PO) Take 1 tablet  by mouth 2 (two) times daily.   . fluticasone (FLONASE) 50 MCG/ACT nasal spray Place 2 sprays into both nostrils daily.  Marland Kitchen loratadine (CLARITIN) 10 MG tablet TAKE 1 TABLET BY MOUTH ONCE DAILY FOR ALLERGIES.  Marland Kitchen montelukast (SINGULAIR) 10 MG tablet TAKE 1 TABLET BY MOUTH AT BEDTIME.  Marland Kitchen Omega-3 Fatty Acids (FISH OIL) 1000 MG CAPS Take 1 capsule by mouth 2 (two) times daily.   . polyethylene glycol powder (GLYCOLAX/MIRALAX) powder MIX 1 CAPFUL IN 8 OUNCES OF JUICE OR WATER AND DRINK ONCE DAILY.  Marland Kitchen potassium chloride SA (K-DUR,KLOR-CON) 20 MEQ tablet TAKE 1 TABLET BY MOUTH DAILY.  . simvastatin (ZOCOR) 20 MG tablet TAKE (1) TABLET BY MOUTH DAILY AT 6 PM FOR CHOLESTEROL.  . SYMBICORT 160-4.5 MCG/ACT inhaler INHALE 2 PUFFS INTO THE LUNGS TWICE DAILY.  Marland Kitchen tiZANidine (ZANAFLEX) 4  MG tablet TAKE ONE TABLET EVERY 8 HOURS AS NEEDED FOR MUSCLE SPASMS.  Marland Kitchen traMADol (ULTRAM) 50 MG tablet TAKE (1) TABLET BY MOUTH EVERY SIX HOURS AS NEEDED.  . [DISCONTINUED] azithromycin (ZITHROMAX) 250 MG tablet Two tablets on day one, then one tablet once daily  for an additional 4 days  . [DISCONTINUED] benzonatate (TESSALON PERLES) 100 MG capsule Take 1 capsule (100 mg total) by mouth every 6 (six) hours as needed for cough.  . [DISCONTINUED] predniSONE (STERAPRED UNI-PAK 21 TAB) 5 MG (21) TBPK tablet Take 1 tablet (5 mg total) by mouth as directed. Use as directed   Facility-Administered Encounter Medications as of 11/12/2016  Medication  . Influenza (>/= 3 years) inactive virus vaccine (FLVIRIN/FLUZONE) injection SUSP 0.5 mL    Activities of Daily Living In your present state of health, do you have any difficulty performing the following activities: 11/12/2016 06/08/2016  Hearing? N N  Vision? N N  Difficulty concentrating or making decisions? Y N  Walking or climbing stairs? N N  Dressing or bathing? N N  Doing errands, shopping? Y N  Comment patient no longer drives -  Conservation officer, nature and eating ? N -  Using the Toilet? N -  In the past six months, have you accidently leaked urine? N -  Do you have problems with loss of bowel control? N -  Managing your Medications? N -  Managing your Finances? N -  Housekeeping or managing your Housekeeping? Y -  Some recent data might be hidden    Patient Care Team: [name], MD as PCP - General Sanjuana Kava, MD as Consulting Physician (Orthopedic Surgery) Patrici Ranks, MD (Inactive) as Consulting Physician (Hematology and Oncology)    Assessment:    Exercise Activities and Dietary recommendations Current Exercise Habits: The patient does not participate in regular exercise at present, Exercise limited by: None identified  Goals    . Quit smoking / using tobacco          Starting 01/04/2016 patient would like to  cut back to 4 cigarettes a day.      Fall Risk Fall Risk  11/12/2016 06/08/2016 01/24/2016 01/04/2016 05/05/2014  Falls in the past year? Yes Yes No No No  Number falls in past yr: 1 2 or more - - -  Injury with Fall? No Yes - - -  Risk for fall due to : Impaired balance/gait;History of fall(s);Impaired mobility - - Impaired vision -  Follow up Falls evaluation completed;Education provided;Falls prevention discussed - - - -   Depression Screen PHQ 2/9 Scores 11/12/2016 06/08/2016 01/24/2016 01/04/2016  PHQ - 2 Score 0 0  5 3  PHQ- 9 Score - - 7 5     Cognitive Function     6CIT Screen 11/12/2016 01/04/2016  What Year? 0 points 0 points  What month? 0 points 0 points  What time? 0 points 0 points  Count back from 20 0 points 0 points  Months in reverse 0 points 0 points  Repeat phrase 0 points 0 points  Total Score 0 0    Immunization History  Administered Date(s) Administered  . Influenza Split 10/31/2011  . Influenza Whole 11/18/2008, 10/12/2010  . Influenza,inj,Quad PF,6+ Mos 11/12/2012, 09/18/2013, 12/22/2014, 10/04/2015  . PPD Test 01/28/2012  . Pneumococcal Conjugate-13 01/04/2015  . Pneumococcal Polysaccharide-23 06/13/2010  . Tdap 01/28/2012   Screening Tests Health Maintenance  Topic Date Due  . INFLUENZA VACCINE  08/15/2016  . HEMOGLOBIN A1C  06/25/2018 (Originally 02/25/2016)  . OPHTHALMOLOGY EXAM  02/26/2020 (Originally 09/07/2015)  . FOOT EXAM  01/23/2017  . MAMMOGRAM  02/08/2018  . COLONOSCOPY  07/11/2020  . TETANUS/TDAP  01/27/2022  . DEXA SCAN  Completed  . Hepatitis C Screening  Completed  . PNA vac Low Risk Adult  Completed      Plan:   I have personally reviewed and noted the following in the patient's chart:   . Medical and social history . Use of alcohol, tobacco or illicit drugs  . Current medications and supplements . Functional ability and status . Nutritional status . Physical activity . Advanced directives . List of other  physicians . Hospitalizations, surgeries, and ER visits in previous 12 months . Vitals . Screenings to include cognitive, depression, and falls . Referrals and appointments  In addition, I have reviewed and discussed with patient certain preventive protocols, quality metrics, and best practice recommendations. A written personalized care plan for preventive services as well as general preventive health recommendations were provided to patient.    BP 150/80. Patient c/o chest pain radiating down her left arm and wrist, and into her neck. She is also c/o chest discomfort and a slight headache. She has not taken her Benazepril in 2 days. Denies SOB. Dr. Meda Coffee notified while patient still here in the office and advises patient to be evaluated in the ED.    Stormy Fabian, LPN  [id number]

## 2016-11-12 NOTE — Telephone Encounter (Signed)
Patient walked into the office today 11/12/16 from the ER stating she has been sitting in the ER since she left from here and has not seen a doctor and they took a chest xray and that was it. Patient states she was not discharged when she left, and she never seen a doctor, she only seen nurses, patient states she hasn't had anything to drink or eat all day and she was tired of sitting over there and she just wanted to go home. [facility] closes at 5:00 per patient. Dusty has tried calling unable to reach [facility]. Dusty Made Rosaria Ferries aware of transportation problem.

## 2016-11-13 ENCOUNTER — Other Ambulatory Visit: Payer: Self-pay | Admitting: Family Medicine

## 2016-11-13 NOTE — Telephone Encounter (Signed)
Noted and agree. 

## 2016-11-14 ENCOUNTER — Other Ambulatory Visit: Payer: Self-pay | Admitting: Family Medicine

## 2016-11-14 ENCOUNTER — Telehealth: Payer: Self-pay | Admitting: *Deleted

## 2016-11-14 NOTE — Telephone Encounter (Signed)
Patient called back and left message on nurse line asking to speak to [name]- she has more questions

## 2016-11-14 NOTE — Telephone Encounter (Signed)
Patient called to get the results from her xray that was done at [facility]. Patient states she was never seen by a doctor and she did not get her results. Patient states while she was there she did have a fever. Please advise

## 2016-11-14 NOTE — Telephone Encounter (Signed)
Patient aware and will call back to schedule with dr simpson if no better

## 2016-11-20 ENCOUNTER — Telehealth: Payer: Self-pay | Admitting: Family Medicine

## 2016-11-20 NOTE — Telephone Encounter (Signed)
Patient called and left message on nurse line. She states that she is going Friday at 9 am to get her cholesterol and then come by the office for a flu shot.   Callback# [phone]

## 2016-11-21 ENCOUNTER — Ambulatory Visit: Payer: Medicare Other | Admitting: Orthopaedic Surgery

## 2016-11-23 ENCOUNTER — Ambulatory Visit: Payer: Medicare Other

## 2016-11-23 DIAGNOSIS — E785 Hyperlipidemia, unspecified: Secondary | ICD-10-CM | POA: Diagnosis not present

## 2016-11-23 LAB — LIPID PANEL
CHOL/HDL RATIO: 2.3 (calc) (ref <5.0)
Cholesterol: 178 mg/dL (ref <200)
HDL: 77 mg/dL (ref >50)
LDL CHOLESTEROL (CALC): 83 mg/dL
Non-HDL Cholesterol (Calc): 101 mg/dL (calc) (ref <130)
Triglycerides: 89 mg/dL (ref <150)

## 2016-11-23 NOTE — Telephone Encounter (Signed)
Patient came in today for her flu shot

## 2016-11-27 ENCOUNTER — Other Ambulatory Visit: Payer: Self-pay | Admitting: Family Medicine

## 2016-11-28 ENCOUNTER — Encounter: Payer: Self-pay | Admitting: Orthopaedic Surgery

## 2016-11-28 ENCOUNTER — Telehealth: Payer: Self-pay | Admitting: *Deleted

## 2016-11-28 ENCOUNTER — Other Ambulatory Visit: Payer: Self-pay

## 2016-11-28 ENCOUNTER — Ambulatory Visit (INDEPENDENT_AMBULATORY_CARE_PROVIDER_SITE_OTHER): Payer: Medicare Other | Admitting: Orthopaedic Surgery

## 2016-11-28 DIAGNOSIS — M7061 Trochanteric bursitis, right hip: Secondary | ICD-10-CM | POA: Diagnosis not present

## 2016-11-28 DIAGNOSIS — F17210 Nicotine dependence, cigarettes, uncomplicated: Secondary | ICD-10-CM

## 2016-11-28 MED ORDER — TIZANIDINE HCL 4 MG PO TABS: ORAL_TABLET | ORAL | 2 refills | Status: DC

## 2016-11-28 MED ORDER — LORATADINE 10 MG PO TABS: ORAL_TABLET | ORAL | 1 refills | Status: DC

## 2016-11-28 NOTE — Progress Notes (Signed)
PROCEDURE NOTE:  The patient request injection, verbal consent was obtained.  The right trochanteric area of the hip was prepped appropriately after time out was performed.   Sterile technique was observed and injection of 1 cc of Depo-Medrol 40 mg with several cc's of plain xylocaine. Anesthesia was provided by ethyl chloride and a 20-gauge needle was used to inject the hip area. The injection was tolerated well.  A band aid dressing was applied.  The patient was advised to apply ice later today and tomorrow to the injection sight as needed.  She continues to smoke but is cutting back slowly.  Encounter Diagnoses  Name Primary?  . Trochanteric bursitis of right hip Yes  . Cigarette nicotine dependence without complication    Return in two months.  Call if any problem.  Precautions discussed.   Electronically Signed Sanjuana Kava, MD 11/14/20181:44 PM

## 2016-11-28 NOTE — Telephone Encounter (Signed)
Patient called left message requesting her tizanidine or loratadine to be refilled, I could not understand the message. Patient stated the pharmacy sent over a request yesterday of what she wanted.

## 2016-11-28 NOTE — Patient Instructions (Signed)
Steps to Quit Smoking Smoking tobacco can be bad for your health. It can also affect almost every organ in your body. Smoking puts you and people around you at risk for many serious Cameo Schmiesing-lasting (chronic) diseases. Quitting smoking is hard, but it is one of the best things that you can do for your health. It is never too late to quit. What are the benefits of quitting smoking? When you quit smoking, you lower your risk for getting serious diseases and conditions. They can include:  Lung cancer or lung disease.  Heart disease.  Stroke.  Heart attack.  Not being able to have children (infertility).  Weak bones (osteoporosis) and broken bones (fractures).  If you have coughing, wheezing, and shortness of breath, those symptoms may get better when you quit. You may also get sick less often. If you are pregnant, quitting smoking can help to lower your chances of having a baby of low birth weight. What can I do to help me quit smoking? Talk with your doctor about what can help you quit smoking. Some things you can do (strategies) include:  Quitting smoking totally, instead of slowly cutting back how much you smoke over a period of time.  Going to in-person counseling. You are more likely to quit if you go to many counseling sessions.  Using resources and support systems, such as: ? Online chats with a counselor. ? Phone quitlines. ? Printed self-help materials. ? Support groups or group counseling. ? Text messaging programs. ? Mobile phone apps or applications.  Taking medicines. Some of these medicines may have nicotine in them. If you are pregnant or breastfeeding, do not take any medicines to quit smoking unless your doctor says it is okay. Talk with your doctor about counseling or other things that can help you.  Talk with your doctor about using more than one strategy at the same time, such as taking medicines while you are also going to in-person counseling. This can help make  quitting easier. What things can I do to make it easier to quit? Quitting smoking might feel very hard at first, but there is a lot that you can do to make it easier. Take these steps:  Talk to your family and friends. Ask them to support and encourage you.  Call phone quitlines, reach out to support groups, or work with a counselor.  Ask people who smoke to not smoke around you.  Avoid places that make you want (trigger) to smoke, such as: ? Bars. ? Parties. ? Smoke-break areas at work.  Spend time with people who do not smoke.  Lower the stress in your life. Stress can make you want to smoke. Try these things to help your stress: ? Getting regular exercise. ? Deep-breathing exercises. ? Yoga. ? Meditating. ? Doing a body scan. To do this, close your eyes, focus on one area of your body at a time from head to toe, and notice which parts of your body are tense. Try to relax the muscles in those areas.  Download or buy apps on your mobile phone or tablet that can help you stick to your quit plan. There are many free apps, such as QuitGuide from the CDC (Centers for Disease Control and Prevention). You can find more support from smokefree.gov and other websites.  This information is not intended to replace advice given to you by your health care provider. Make sure you discuss any questions you have with your health care provider. Document Released: 10/28/2008 Document   Revised: 08/30/2015 Document Reviewed: 05/18/2014 Elsevier Interactive Patient Education  2018 Elsevier Inc.  

## 2016-11-28 NOTE — Telephone Encounter (Signed)
Both refilled

## 2016-12-13 ENCOUNTER — Other Ambulatory Visit: Payer: Self-pay | Admitting: Orthopaedic Surgery

## 2016-12-13 ENCOUNTER — Other Ambulatory Visit: Payer: Self-pay | Admitting: Family Medicine

## 2016-12-27 ENCOUNTER — Other Ambulatory Visit: Payer: Self-pay | Admitting: Family Medicine

## 2016-12-27 NOTE — Telephone Encounter (Signed)
Seen 10 10 18

## 2017-01-16 ENCOUNTER — Other Ambulatory Visit: Payer: Self-pay | Admitting: Family Medicine

## 2017-01-16 NOTE — Telephone Encounter (Signed)
Seen 10 10 18

## 2017-01-18 ENCOUNTER — Other Ambulatory Visit: Payer: Self-pay | Admitting: Family Medicine

## 2017-01-23 ENCOUNTER — Encounter: Payer: Self-pay | Admitting: Family Medicine

## 2017-01-23 ENCOUNTER — Ambulatory Visit (INDEPENDENT_AMBULATORY_CARE_PROVIDER_SITE_OTHER): Payer: Medicare Other | Admitting: Family Medicine

## 2017-01-23 VITALS — BP 146/86 | HR 102 | Resp 16 | Ht 64.0 in | Wt 160.8 lb

## 2017-01-23 DIAGNOSIS — Z1211 Encounter for screening for malignant neoplasm of colon: Secondary | ICD-10-CM

## 2017-01-23 DIAGNOSIS — Z0000 Encounter for general adult medical examination without abnormal findings: Secondary | ICD-10-CM

## 2017-01-23 DIAGNOSIS — F17200 Nicotine dependence, unspecified, uncomplicated: Secondary | ICD-10-CM

## 2017-01-23 DIAGNOSIS — I10 Essential (primary) hypertension: Secondary | ICD-10-CM

## 2017-01-23 DIAGNOSIS — F17210 Nicotine dependence, cigarettes, uncomplicated: Secondary | ICD-10-CM | POA: Diagnosis not present

## 2017-01-23 DIAGNOSIS — N83201 Unspecified ovarian cyst, right side: Secondary | ICD-10-CM

## 2017-01-23 DIAGNOSIS — R102 Pelvic and perineal pain unspecified side: Secondary | ICD-10-CM

## 2017-01-23 DIAGNOSIS — Z1231 Encounter for screening mammogram for malignant neoplasm of breast: Secondary | ICD-10-CM

## 2017-01-23 LAB — POC HEMOCCULT BLD/STL (OFFICE/1-CARD/DIAGNOSTIC): FECAL OCCULT BLD: POSITIVE — AB

## 2017-01-23 MED ORDER — AMLODIPINE BESYLATE 5 MG PO TABS: 5.0000 mg | ORAL_TABLET | Freq: Every day | ORAL | 3 refills | Status: DC

## 2017-01-23 NOTE — Patient Instructions (Addendum)
F/u in 5.5 months, call if you need me before  Fasting lipid, cmp and EGFr and HBA1C in 5.5 months  Please work on quitting  Smoking,need to quit to stop coughing!!!!  You are referred for pelvic US and for mammogram  Becauser of hidden blood in your stool and abdominal pain I will refer you to a stomach specialist.   Now at 10 cigarettes daily need to quit      Steps to Quit Smoking Smoking tobacco can be bad for your health. It can also affect almost every organ in your body. Smoking puts you and people around you at risk for many serious long-lasting (chronic) diseases. Quitting smoking is hard, but it is one of the best things that you can do for your health. It is never too late to quit. What are the benefits of quitting smoking? When you quit smoking, you lower your risk for getting serious diseases and conditions. They can include:  Lung cancer or lung disease.  Heart disease.  Stroke.  Heart attack.  Not being able to have children (infertility).  Weak bones (osteoporosis) and broken bones (fractures).  If you have coughing, wheezing, and shortness of breath, those symptoms may get better when you quit. You may also get sick less often. If you are pregnant, quitting smoking can help to lower your chances of having a baby of low birth weight. What can I do to help me quit smoking? Talk with your doctor about what can help you quit smoking. Some things you can do (strategies) include:  Quitting smoking totally, instead of slowly cutting back how much you smoke over a period of time.  Going to in-person counseling. You are more likely to quit if you go to many counseling sessions.  Using resources and support systems, such as: ? Database administrator with a Social worker. ? Phone quitlines. ? Careers information officer. ? Support groups or group counseling. ? Text messaging programs. ? Mobile phone apps or applications.  Taking medicines. Some of these medicines may have  nicotine in them. If you are pregnant or breastfeeding, do not take any medicines to quit smoking unless your doctor says it is okay. Talk with your doctor about counseling or other things that can help you.  Talk with your doctor about using more than one strategy at the same time, such as taking medicines while you are also going to in-person counseling. This can help make quitting easier. What things can I do to make it easier to quit? Quitting smoking might feel very hard at first, but there is a lot that you can do to make it easier. Take these steps:  Talk to your family and friends. Ask them to support and encourage you.  Call phone quitlines, reach out to support groups, or work with a Social worker.  Ask people who smoke to not smoke around you.  Avoid places that make you want (trigger) to smoke, such as: ? Bars. ? Parties. ? Smoke-break areas at work.  Spend time with people who do not smoke.  Lower the stress in your life. Stress can make you want to smoke. Try these things to help your stress: ? Getting regular exercise. ? Deep-breathing exercises. ? Yoga. ? Meditating. ? Doing a body scan. To do this, close your eyes, focus on one area of your body at a time from head to toe, and notice which parts of your body are tense. Try to relax the muscles in those areas.  Download or buy  apps on your mobile phone or tablet that can help you stick to your quit plan. There are many free apps, such as QuitGuide from the [employer] Office manager for Disease Control and Prevention). You can find more support from smokefree.gov and other websites.  This information is not intended to replace advice given to you by your health care provider. Make sure you discuss any questions you have with your health care provider. Document Released: 10/28/2008 Document Revised: 08/30/2015 Document Reviewed: 05/18/2014 Elsevier Interactive Patient Education  2018 [employer].

## 2017-01-24 ENCOUNTER — Telehealth: Payer: Self-pay | Admitting: Family Medicine

## 2017-01-24 NOTE — Telephone Encounter (Signed)
Pt is calling in has a fever 101.0 and throat is raw,  would like for you to call in Z pack to [facility]

## 2017-01-24 NOTE — Telephone Encounter (Signed)
She was just here yesterday. Will call and address

## 2017-01-25 ENCOUNTER — Other Ambulatory Visit: Payer: Self-pay | Admitting: Family Medicine

## 2017-01-25 MED ORDER — AZITHROMYCIN 250 MG PO TABS: ORAL_TABLET | ORAL | 0 refills | Status: DC

## 2017-01-25 NOTE — Telephone Encounter (Signed)
Patient aware.

## 2017-01-25 NOTE — Telephone Encounter (Signed)
States she still has a bad cough with green mucus and she was running a fever of 101 yesterday. Checked it when I was on the phone and it was 98.5 but she had taken tylenol this am. Requesting something be sent in

## 2017-01-25 NOTE — Telephone Encounter (Signed)
Z pack sent to [facility] pls let her know

## 2017-01-25 NOTE — Progress Notes (Signed)
C/o cough and fever and green sputum

## 2017-01-28 ENCOUNTER — Encounter: Payer: Self-pay | Admitting: Family Medicine

## 2017-01-28 DIAGNOSIS — R102 Pelvic and perineal pain: Secondary | ICD-10-CM | POA: Insufficient documentation

## 2017-01-28 NOTE — Assessment & Plan Note (Signed)
Uncontrolled, pt out of medication  DASH diet and commitment to daily physical activity for a minimum of 30 minutes discussed and encouraged, as a part of hypertension management. The importance of attaining a healthy weight is also discussed.  BP/Weight 01/23/2017 11/12/2016 11/12/2016 10/24/2016 10/03/2016 09/20/2016 7/00/1749  Systolic BP 449 675 916 384 665 993 570  Diastolic BP 86 85 80 78 78 74 78  Wt. (Lbs) 160.8 167 156.08 160.25 167 163 160  BMI 27.6 28.67 26.79 27.51 28.67 27.98 27.46

## 2017-01-28 NOTE — Assessment & Plan Note (Signed)
Right ovarian cyst followed by gyne last visualised in 2016, f/u imaging ordeed

## 2017-01-28 NOTE — Progress Notes (Signed)
[name]     MRN: [id number]      DOB: 1945/11/16  HPI: Patient is in for annual physical exam. C/o left pelvic pain x past 6 weeks intermittently has ovaries and h/o right  Ovarian cyst C/o cough 1 week, no fever or chills, scant  Sputum, chronic nicotine use Recent labs, if available are reviewed. Immunization is reviewed , and  Is up to date   PE: BP (!) 146/86   Pulse (!) 102   Resp 16   Ht 5\' 4"  (1.626 m)   Wt 160 lb 12.8 oz (72.9 kg)   SpO2 95%   BMI 27.60 kg/m   Pleasant  female, alert and oriented x 3, in no cardio-pulmonary distress. Afebrile. HEENT No facial trauma or asymetry. Sinuses non tender.  Extra occullar muscles intact,  External ears normal, tympanic membranes clear. Oropharynx moist, no exudate. Neck: supple, no adenopathy,JVD or thyromegaly.No bruits.  Chest: Decreased though adequate air entry, few wheezes, no crackles Non tender to palpation  Breast: No asymetry,no masses or lumps. No tenderness. No nipple discharge or inversion. No axillary or supraclavicular adenopathy  Cardiovascular system; Heart sounds normal,  S1 and  S2 ,no S3.  No murmur, or thrill. Apical beat not displaced Peripheral pulses normal.  Abdomen: Soft, non tender, no organomegaly or masses. No bruits. Bowel sounds normal. No guarding, tenderness or rebound.  Rectal:  Normal sphincter tone. No rectal mass. Guaiac negative stool.  GU: External genitalia normal female genitalia , normal female distribution of hair. No lesions. Urethral meatus normal in size, no  Prolapse, no lesions visibly  Present. Bladder non tender. Vagina pink and moist , with no visible lesions , discharge present . Adequate pelvic support no  cystocele or rectocele noted  Uterusabsent, no adnexal masses, no left  adnexal tenderness.   Musculoskeletal exam: Decreased though adequate  ROM of spine, hips , shoulders and knees. No deformity ,swelling or crepitus noted. No muscle  wasting or atrophy.   Neurologic: Cranial nerves 2 to 12 intact. Power, tone ,sensation and reflexes normal throughout. No disturbance in gait. No tremor.  Skin: Intact, no ulceration, erythema , scaling or rash noted. Pigmentation normal throughout  Psych; Normal mood and affect. Judgement and concentration normal   Assessment & Plan:  Annual physical exam Annual exam as documented. Counseling done  re healthy lifestyle involving commitment to 150 minutes exercise per week, heart healthy diet, and attaining healthy weight.The importance of adequate sleep also discussed. Regular seat belt use and home safety, is also discussed. Changes in health habits are decided on by the patient with goals and time frames  set for achieving them. Immunization and cancer screening needs are specifically addressed at this visit.   NICOTINE ADDICTION Patient is asked and  confirms current  Nicotine use.  Five to seven minutes of time is spent in counseling the patient of the need to quit smoking  Advice to quit is delivered clearly specifically in reducing the risk of developing heart disease, having a stroke, or of developing all types of cancer, especially lung and oral cancer. Improvement in breathing and exercise tolerance and quality of life is also discussed, as is the economic benefit.  Assessment of willingness to quit or to make an attempt to quit is made and documented  Assistance in quit attempt is made with several and varied options presented, based on patient's desire and need. These include  literature, local classes available, 1800 QUIT NOW number, OTC and  prescription medication.  The GOAL to be NICOTINE FREE is re emphasized.  The patient has set a personal goal of either reduction or discontinuation and follow up is arranged between 6 an 16 weeks.    Hypertension goal BP (blood pressure) < 130/80 Uncontrolled, pt out of medication  DASH diet and commitment to daily  physical activity for a minimum of 30 minutes discussed and encouraged, as a part of hypertension management. The importance of attaining a healthy weight is also discussed.  BP/Weight 01/23/2017 11/12/2016 11/12/2016 10/24/2016 10/03/2016 09/20/2016 5/46/5681  Systolic BP 275 170 017 494 496 759 163  Diastolic BP 86 85 80 78 78 74 78  Wt. (Lbs) 160.8 167 156.08 160.25 167 163 160  BMI 27.6 28.67 26.79 27.51 28.67 27.98 27.46       Pelvic pain in female Intermittent left pelvic pain x several weeks with known right post menopausal ovarian cyst  Needs imaging  postmenopausal ovarian cyst Right ovarian cyst followed by gyne last visualised in 2016, f/u imaging ordeed

## 2017-01-28 NOTE — Assessment & Plan Note (Signed)

## 2017-01-28 NOTE — Assessment & Plan Note (Signed)

## 2017-01-28 NOTE — Assessment & Plan Note (Signed)
Intermittent left pelvic pain x several weeks with known right post menopausal ovarian cyst  Needs imaging

## 2017-01-29 ENCOUNTER — Ambulatory Visit (INDEPENDENT_AMBULATORY_CARE_PROVIDER_SITE_OTHER): Payer: Medicare Other | Admitting: Orthopaedic Surgery

## 2017-01-29 ENCOUNTER — Encounter: Payer: Self-pay | Admitting: Orthopaedic Surgery

## 2017-01-29 VITALS — BP 116/61 | HR 89 | Ht 64.0 in | Wt 157.0 lb

## 2017-01-29 DIAGNOSIS — M7061 Trochanteric bursitis, right hip: Secondary | ICD-10-CM | POA: Diagnosis not present

## 2017-01-29 DIAGNOSIS — F17210 Nicotine dependence, cigarettes, uncomplicated: Secondary | ICD-10-CM | POA: Diagnosis not present

## 2017-01-29 NOTE — Patient Instructions (Signed)
Steps to Quit Smoking Smoking tobacco can be bad for your health. It can also affect almost every organ in your body. Smoking puts you and people around you at risk for many serious long-lasting (chronic) diseases. Quitting smoking is hard, but it is one of the best things that you can do for your health. It is never too late to quit. What are the benefits of quitting smoking? When you quit smoking, you lower your risk for getting serious diseases and conditions. They can include:  Lung cancer or lung disease.  Heart disease.  Stroke.  Heart attack.  Not being able to have children (infertility).  Weak bones (osteoporosis) and broken bones (fractures).  If you have coughing, wheezing, and shortness of breath, those symptoms may get better when you quit. You may also get sick less often. If you are pregnant, quitting smoking can help to lower your chances of having a baby of low birth weight. What can I do to help me quit smoking? Talk with your doctor about what can help you quit smoking. Some things you can do (strategies) include:  Quitting smoking totally, instead of slowly cutting back how much you smoke over a period of time.  Going to in-person counseling. You are more likely to quit if you go to many counseling sessions.  Using resources and support systems, such as: ? Online chats with a counselor. ? Phone quitlines. ? Printed self-help materials. ? Support groups or group counseling. ? Text messaging programs. ? Mobile phone apps or applications.  Taking medicines. Some of these medicines may have nicotine in them. If you are pregnant or breastfeeding, do not take any medicines to quit smoking unless your doctor says it is okay. Talk with your doctor about counseling or other things that can help you.  Talk with your doctor about using more than one strategy at the same time, such as taking medicines while you are also going to in-person counseling. This can help make  quitting easier. What things can I do to make it easier to quit? Quitting smoking might feel very hard at first, but there is a lot that you can do to make it easier. Take these steps:  Talk to your family and friends. Ask them to support and encourage you.  Call phone quitlines, reach out to support groups, or work with a counselor.  Ask people who smoke to not smoke around you.  Avoid places that make you want (trigger) to smoke, such as: ? Bars. ? Parties. ? Smoke-break areas at work.  Spend time with people who do not smoke.  Lower the stress in your life. Stress can make you want to smoke. Try these things to help your stress: ? Getting regular exercise. ? Deep-breathing exercises. ? Yoga. ? Meditating. ? Doing a body scan. To do this, close your eyes, focus on one area of your body at a time from head to toe, and notice which parts of your body are tense. Try to relax the muscles in those areas.  Download or buy apps on your mobile phone or tablet that can help you stick to your quit plan. There are many free apps, such as QuitGuide from the CDC (Centers for Disease Control and Prevention). You can find more support from smokefree.gov and other websites.  This information is not intended to replace advice given to you by your health care provider. Make sure you discuss any questions you have with your health care provider. Document Released: 10/28/2008 Document   Revised: 08/30/2015 Document Reviewed: 05/18/2014 Elsevier Interactive Patient Education  2018 Elsevier Inc.  

## 2017-01-29 NOTE — Progress Notes (Signed)
Patient [name], female DOB:Jul 10, 1945, 72 y.o. LXB:[id number]  Chief Complaint  Patient presents with  . Hip Pain    right     HPI  [name] is a 72 y.o. female who has hip pain on the right.  She is much improved after the injection last time.  She is walking well. HPI  Body mass index is 26.95 kg/m.  ROS  Review of Systems  Past Medical History:  Diagnosis Date  . Breast cancer ([place]) 03/11/2012   Stage I (T1b N0 M0), grade 1 well-differentiated carcinoma of the left breast status, post lumpectomy followed by radiation therapy. Her estrogen receptor receptors were 93%, progesterone receptors 67%. HER-2/neu was negative. No lymphovascular space invasion was seen. All margins were clear. Ki-67 marker was low at 1% with surgery on 11/15/2004. Treated then with post-lumpectomy radiation, finish  . Breast cancer, left breast ([place])   . COPD (chronic obstructive pulmonary disease) ([place])   . Diabetes mellitus without complication ([place])   . Diverticula of colon   . Hyperlipidemia   . Hypertension   . Kidney stones   . Nicotine dependence   . Osteoarthritis     Past Surgical History:  Procedure Laterality Date  . ABDOMINAL HYSTERECTOMY    . BREAST SURGERY  2005 approx   left lumpectomy   . CHOLECYSTECTOMY N/A 05/26/2014   Procedure: LAPAROSCOPIC CHOLECYSTECTOMY;  Surgeon: Aviva Signs Md, MD;  Location: [facility];  Service: General;  Laterality: N/A;  . COLECTOMY     2005, diverticulitis  . DILATION AND CURETTAGE OF UTERUS    . left breast      cancer, in 2006  . TUBAL LIGATION      Family History  Problem Relation Age of Onset  . Breast cancer Mother   . COPD Father   . Emphysema Father   . Throat cancer Sister   . Glaucoma Brother   . Schizophrenia Brother   . Heart attack Brother   . Lung cancer Sister        former smoker  . Hepatitis C Daughter   . Seizures Daughter   . SIDS Son     Social History Social History   Tobacco Use  . Smoking  status: Current Every Day Smoker    Packs/day: 1.00    Years: 50.00    Pack years: 50.00    Types: Cigarettes  . Smokeless tobacco: Never Used  . Tobacco comment: cigaretes  now 10 to 15 per day  Substance Use Topics  . Alcohol use: No    Alcohol/week: 0.0 oz  . Drug use: No    Allergies  Allergen Reactions  . Penicillins Hives, Shortness Of Breath and Swelling    Has patient had a PCN reaction causing immediate rash, facial/tongue/throat swelling, SOB or lightheadedness with hypotension: yes Has patient had a PCN reaction causing severe rash involving mucus membranes or skin necrosis:no Has patient had a PCN reaction that required hospitalization: yes Has patient had a PCN reaction occurring within the last 10 years: no If all of the above answers are "NO", then may proceed with Cephalosporin use.  . Sulfonamide Derivatives Other (See Comments)    Shaking all over, seizure like symptoms. Hospitalization resulted     Current Outpatient Medications  Medication Sig Dispense Refill  . acetaminophen (TYLENOL) 500 MG tablet Take 500-1,000 mg by mouth every 6 (six) hours as needed for moderate pain.     Marland Kitchen albuterol (PROVENTIL) (2.5 MG/3ML) 0.083% nebulizer solution USE  1 VIAL IN NEBULIZER 3 TIMES DAILY. 300 mL 0  . amLODipine (NORVASC) 5 MG tablet Take 1 tablet (5 mg total) by mouth daily. 90 tablet 3  . aspirin EC 81 MG tablet Take 81 mg by mouth daily.    . ATROVENT HFA 17 MCG/ACT inhaler INHALE 2 PUFFS BY MOUTH FOUR TIMES A DAY. 12.9 g 5  . azelastine (ASTELIN) 0.1 % nasal spray PLACE 2 SPRAYS INTO EACH NOSTRIL TWICE DAILY. 30 mL 5  . benazepril (LOTENSIN) 40 MG tablet TAKE ONE TABLET BY MOUTH DAILY. 90 tablet 0  . Calcium Carbonate-Vitamin D (CALCIUM 600 + D PO) Take 1 tablet by mouth 2 (two) times daily.     . fluticasone (FLONASE) 50 MCG/ACT nasal spray Place 2 sprays into both nostrils daily. 16 g 6  . loratadine (CLARITIN) 10 MG tablet TAKE 1 TABLET BY MOUTH ONCE DAILY FOR  ALLERGIES. 90 tablet 1  . montelukast (SINGULAIR) 10 MG tablet TAKE 1 TABLET BY MOUTH AT BEDTIME. 90 tablet 1  . Omega-3 Fatty Acids (FISH OIL) 1000 MG CAPS Take 1 capsule by mouth 2 (two) times daily.     . polyethylene glycol powder (GLYCOLAX/MIRALAX) powder MIX 1 CAPFUL IN 8 OUNCES OF JUICE OR WATER AND DRINK ONCE DAILY. 527 g 5  . potassium chloride SA (K-DUR,KLOR-CON) 20 MEQ tablet TAKE 1 TABLET BY MOUTH DAILY. 90 tablet 0  . simvastatin (ZOCOR) 20 MG tablet TAKE (1) TABLET BY MOUTH DAILY AT 6 PM FOR CHOLESTEROL. 90 tablet 0  . SYMBICORT 160-4.5 MCG/ACT inhaler INHALE 2 PUFFS INTO THE LUNGS TWICE DAILY. 10.2 g 5  . tiZANidine (ZANAFLEX) 4 MG tablet TAKE ONE TABLET EVERY 8 HOURS AS NEEDED FOR MUSCLE SPASMS. 45 tablet 2  . traMADol (ULTRAM) 50 MG tablet TAKE (1) TABLET BY MOUTH EVERY SIX HOURS AS NEEDED. 60 tablet 3   Current Facility-Administered Medications  Medication Dose Route Frequency Provider Last Rate Last Dose  . Influenza (>/= 3 years) inactive virus vaccine (FLVIRIN/FLUZONE) injection SUSP 0.5 mL  0.5 mL Intramuscular Once [name], MD         Physical Exam  Blood pressure 116/61, pulse 89, height '5\' 4"'  (1.626 m), weight 157 lb (71.2 kg).  Constitutional: overall normal hygiene, normal nutrition, well developed, normal grooming, normal body habitus. Assistive device:none  Musculoskeletal: gait and station Limp none, muscle tone and strength are normal, no tremors or atrophy is present.  .  Neurological: coordination overall normal.  Deep tendon reflex/nerve stretch intact.  Sensation normal.  Cranial nerves II-XII intact.   Skin:   Normal overall no scars, lesions, ulcers or rashes. No psoriasis.  Psychiatric: Alert and oriented x 3.  Recent memory intact, remote memory unclear.  Normal mood and affect. Well groomed.  Good eye contact.  Cardiovascular: overall no swelling, no varicosities, no edema bilaterally, normal temperatures of the legs and arms, no  clubbing, cyanosis and good capillary refill.  Lymphatic: palpation is normal.  Right hip is with no pain and full motion.  NV intact.  All other systems reviewed and are negative   The patient has been educated about the nature of the problem(s) and counseled on treatment options.  The patient appeared to understand what I have discussed and is in agreement with it.  Encounter Diagnoses  Name Primary?  . Trochanteric bursitis of right hip Yes  . Cigarette nicotine dependence without complication     PLAN Call if any problems.  Precautions discussed.  Continue current medications.  Return to clinic 4 months   Electronically Signed Sanjuana Kava, MD 1/15/20192:06 PM

## 2017-02-11 ENCOUNTER — Other Ambulatory Visit: Payer: Self-pay | Admitting: Family Medicine

## 2017-02-18 ENCOUNTER — Other Ambulatory Visit: Payer: Self-pay | Admitting: Family Medicine

## 2017-02-26 ENCOUNTER — Other Ambulatory Visit: Payer: Self-pay | Admitting: Family Medicine

## 2017-02-27 ENCOUNTER — Telehealth: Payer: Self-pay | Admitting: Family Medicine

## 2017-02-27 DIAGNOSIS — K921 Melena: Secondary | ICD-10-CM

## 2017-02-27 NOTE — Telephone Encounter (Signed)
Pt is calling regarding a Referral to the Stomach Dr?

## 2017-02-27 NOTE — Telephone Encounter (Signed)
Should have been done at her visit. Will refer based on the note

## 2017-02-28 ENCOUNTER — Encounter: Payer: Self-pay | Admitting: Gastroenterology

## 2017-03-01 ENCOUNTER — Emergency Department (HOSPITAL_COMMUNITY): Payer: Medicare Other

## 2017-03-01 ENCOUNTER — Emergency Department (HOSPITAL_COMMUNITY)
Admission: EM | Admit: 2017-03-01 | Discharge: 2017-03-01 | Disposition: A | Discharge status: Home / Self Care | Payer: Medicare Other | Attending: Emergency Medicine | Admitting: Emergency Medicine

## 2017-03-01 ENCOUNTER — Encounter (HOSPITAL_COMMUNITY): Payer: Self-pay

## 2017-03-01 DIAGNOSIS — I10 Essential (primary) hypertension: Secondary | ICD-10-CM | POA: Insufficient documentation

## 2017-03-01 DIAGNOSIS — E119 Type 2 diabetes mellitus without complications: Secondary | ICD-10-CM | POA: Diagnosis not present

## 2017-03-01 DIAGNOSIS — R1084 Generalized abdominal pain: Secondary | ICD-10-CM | POA: Diagnosis not present

## 2017-03-01 DIAGNOSIS — Z79899 Other long term (current) drug therapy: Secondary | ICD-10-CM | POA: Insufficient documentation

## 2017-03-01 DIAGNOSIS — R109 Unspecified abdominal pain: Secondary | ICD-10-CM

## 2017-03-01 DIAGNOSIS — J449 Chronic obstructive pulmonary disease, unspecified: Secondary | ICD-10-CM | POA: Insufficient documentation

## 2017-03-01 DIAGNOSIS — R1032 Left lower quadrant pain: Secondary | ICD-10-CM | POA: Insufficient documentation

## 2017-03-01 DIAGNOSIS — N390 Urinary tract infection, site not specified: Secondary | ICD-10-CM | POA: Diagnosis not present

## 2017-03-01 DIAGNOSIS — F17210 Nicotine dependence, cigarettes, uncomplicated: Secondary | ICD-10-CM | POA: Insufficient documentation

## 2017-03-01 LAB — COMPREHENSIVE METABOLIC PANEL
ALBUMIN: 4.3 g/dL (ref 3.5–5.0)
ALT: 14 U/L (ref 14–54)
AST: 17 U/L (ref 15–41)
Alkaline Phosphatase: 71 U/L (ref 38–126)
Anion gap: 11 (ref 5–15)
BILIRUBIN TOTAL: 0.7 mg/dL (ref 0.3–1.2)
BUN: 22 mg/dL — AB (ref 6–20)
CALCIUM: 9.8 mg/dL (ref 8.9–10.3)
CO2: 24 mmol/L (ref 22–32)
CREATININE: 0.82 mg/dL (ref 0.44–1.00)
Chloride: 104 mmol/L (ref 101–111)
GFR calc Af Amer: >60 mL/min (ref >60)
GLUCOSE: 96 mg/dL (ref 65–99)
Potassium: 4.2 mmol/L (ref 3.5–5.1)
Sodium: 139 mmol/L (ref 135–145)
TOTAL PROTEIN: 7.8 g/dL (ref 6.5–8.1)

## 2017-03-01 LAB — URINALYSIS, ROUTINE W REFLEX MICROSCOPIC
BILIRUBIN URINE: NEGATIVE
Glucose, UA: NEGATIVE mg/dL
Hgb urine dipstick: NEGATIVE
Ketones, ur: NEGATIVE mg/dL
NITRITE: POSITIVE — AB
PH: 7 (ref 5.0–8.0)
Protein, ur: 30 mg/dL — AB
SPECIFIC GRAVITY, URINE: 1.014 (ref 1.005–1.030)

## 2017-03-01 LAB — CBC
HCT: 47.5 % — ABNORMAL HIGH (ref 36.0–46.0)
Hemoglobin: 14.9 g/dL (ref 12.0–15.0)
MCH: 29 pg (ref 26.0–34.0)
MCHC: 31.4 g/dL (ref 30.0–36.0)
MCV: 92.6 fL (ref 78.0–100.0)
PLATELETS: 372 10*3/uL (ref 150–400)
RBC: 5.13 MIL/uL — AB (ref 3.87–5.11)
RDW: 14 % (ref 11.5–15.5)
WBC: 9.7 10*3/uL (ref 4.0–10.5)

## 2017-03-01 LAB — LIPASE, BLOOD: Lipase: 30 U/L (ref 11–51)

## 2017-03-01 MED ORDER — IOPAMIDOL (ISOVUE-300) INJECTION 61%
100.0000 mL | Freq: Once | INTRAVENOUS | Status: AC | PRN
  Administered 2017-03-01: 100 mL via INTRAVENOUS

## 2017-03-01 MED ORDER — SODIUM CHLORIDE 0.9 % IV SOLN
1.0000 g | Freq: Once | INTRAVENOUS | Status: AC
  Administered 2017-03-01: 1 g via INTRAVENOUS
  Filled 2017-03-01: qty 10

## 2017-03-01 MED ORDER — CEPHALEXIN 500 MG PO CAPS: 500.0000 mg | ORAL_CAPSULE | Freq: Four times a day (QID) | ORAL | 0 refills | Status: DC

## 2017-03-01 NOTE — ED Triage Notes (Signed)
Pt brought in by EMS due to abdominal pain approx 2 months. Seen by PCP and was referred to GI. Pt reports she was told she has blood in stool and burning with urination

## 2017-03-01 NOTE — ED Provider Notes (Signed)
[facility] Provider Note   CSN: [id number] Arrival date & time: 03/01/17  1045     History   Chief Complaint Chief Complaint  Patient presents with  . Abdominal Pain    HPI [name] is a 72 y.o. female.  Left lower quadrant abdominal pain for 2 months worse with coughing.  She has been able to eat with no weight loss.  She was diagnosed with diverticulitis in 2003.  She has had a normal bowel movement and normal urinary output.  No fever, sweats, chills, dysuria, hematuria.  She was recently referred to GI for allegedly "blood in her stool".  Severity of symptoms is mild to moderate.      Past Medical History:  Diagnosis Date  . Breast cancer ([facility]) 03/11/2012   Stage I (T1b N0 M0), grade 1 well-differentiated carcinoma of the left breast status, post lumpectomy followed by radiation therapy. Her estrogen receptor receptors were 93%, progesterone receptors 67%. HER-2/neu was negative. No lymphovascular space invasion was seen. All margins were clear. Ki-67 marker was low at 1% with surgery on 11/15/2004. Treated then with post-lumpectomy radiation, finish  . Breast cancer, left breast ([place])   . COPD (chronic obstructive pulmonary disease) ([place])   . Diabetes mellitus without complication ([place])   . Diverticula of colon   . Hyperlipidemia   . Hypertension   . Kidney stones   . Nicotine dependence   . Osteoarthritis     Patient Active Problem List   Diagnosis Date Noted  . Pelvic pain in female 01/28/2017  . Allergic rhinitis 01/04/2015  . 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[place]) 09/26/2014  . Acute bronchitis with COPD ([place]) 06/30/2014  . Hydrosalpinx 09/28/2013  . Annual physical exam 09/17/2013  . Osteopenia 07/15/2013  . postmenopausal ovarian cyst 04/06/2013  . Pulmonary nodules 04/04/2012  . Breast cancer ([place]) 03/11/2012  . Diabetes mellitus type 2, diet-controlled ([place]) 09/13/2011  . Ganglion cyst 05/08/2011  . COPD (chronic obstructive pulmonary  disease) with chronic bronchitis ([place]) 07/24/2009  . Hyperlipidemia LDL goal <100 03/09/2009  . CONSTIPATION, CHRONIC 03/09/2009  . NICOTINE ADDICTION 10/10/2008  . Hypertension goal BP (blood pressure) < 130/80 09/29/2008  . GENERALIZED OSTEOARTHROSIS UNSPECIFIED SITE 09/29/2008    Past Surgical History:  Procedure Laterality Date  . ABDOMINAL HYSTERECTOMY    . BREAST SURGERY  2005 approx   left lumpectomy   . CHOLECYSTECTOMY N/A 05/26/2014   Procedure: LAPAROSCOPIC CHOLECYSTECTOMY;  Surgeon: Aviva Signs Md, MD;  Location: [facility];  Service: General;  Laterality: N/A;  . COLECTOMY     2005, diverticulitis  . DILATION AND CURETTAGE OF UTERUS    . left breast      cancer, in 2006  . TUBAL LIGATION      OB History    Gravida Para Term Preterm AB Living   _0 SAB TAB Ectopic Multiple Live Births   3               Home Medications    Prior to Admission medications   Medication Sig Start Date End Date Taking? Authorizing Provider  acetaminophen (TYLENOL) 500 MG tablet Take 500-1,000 mg by mouth every 6 (six) hours as needed for moderate pain.    Yes [provider]  albuterol (PROVENTIL) (2.5 MG/3ML) 0.083% nebulizer solution USE 1 VIAL IN NEBULIZER 3 TIMES DAILY. 08/11/13  Yes [name], MD  amLODipine (NORVASC) 5 MG tablet Take 1 tablet (5 mg total) by  mouth daily. 01/23/17  Yes [name], MD  aspirin EC 81 MG tablet Take 81 mg by mouth daily.   Yes [provider]  ATROVENT HFA 17 MCG/ACT inhaler INHALE 2 PUFFS BY MOUTH FOUR TIMES A DAY. 10/01/16  Yes [name], MD  azelastine (ASTELIN) 0.1 % nasal spray PLACE 2 SPRAYS INTO EACH NOSTRIL TWICE DAILY. 03/14/16  Yes [name], MD  benazepril (LOTENSIN) 40 MG tablet TAKE ONE TABLET BY MOUTH DAILY. 11/15/16  Yes [name], MD  Calcium Carbonate-Vitamin D (CALCIUM 600 + D PO) Take 1 tablet by mouth 2 (two) times daily.    Yes [provider]    fluticasone (FLONASE) 50 MCG/ACT nasal spray Place 2 sprays into both nostrils daily. 06/08/16  Yes Raylene Everts, MD  loratadine (CLARITIN) 10 MG tablet TAKE 1 TABLET BY MOUTH ONCE DAILY FOR ALLERGIES. 11/28/16  Yes [name], MD  montelukast (SINGULAIR) 10 MG tablet TAKE 1 TABLET BY MOUTH AT BEDTIME. 02/18/17  Yes [name], MD  Omega-3 Fatty Acids (FISH OIL) 1000 MG CAPS Take 1 capsule by mouth 2 (two) times daily.    Yes [provider]  polyethylene glycol powder (GLYCOLAX/MIRALAX) powder MIX 1 CAPFUL IN 8 OUNCES OF JUICE OR WATER AND DRINK ONCE DAILY. 04/23/16  Yes [name], MD  potassium chloride SA (K-DUR,KLOR-CON) 20 MEQ tablet TAKE 1 TABLET BY MOUTH DAILY. 12/27/16  Yes [name], MD  simvastatin (ZOCOR) 20 MG tablet TAKE (1) TABLET BY MOUTH DAILY AT 6 PM FOR CHOLESTEROL. 12/27/16  Yes [name], MD  SYMBICORT 160-4.5 MCG/ACT inhaler INHALE 2 PUFFS INTO THE LUNGS TWICE DAILY. 10/01/16  Yes [name], MD  tiZANidine (ZANAFLEX) 4 MG tablet TAKE ONE TABLET EVERY 8 HOURS AS NEEDED FOR MUSCLE SPASMS. 11/28/16  Yes [name], MD  traMADol (ULTRAM) 50 MG tablet TAKE (1) TABLET BY MOUTH EVERY SIX HOURS AS NEEDED. 12/17/16  Yes Sanjuana Kava, MD  benazepril (LOTENSIN) 40 MG tablet TAKE ONE TABLET BY MOUTH DAILY. 02/13/17   [name], MD  cephALEXin (KEFLEX) 500 MG capsule Take 1 capsule (500 mg total) by mouth 4 (four) times daily. 03/01/17   Nat Christen, MD  tiZANidine (ZANAFLEX) 4 MG tablet TAKE ONE TABLET EVERY 8 HOURS AS NEEDED FOR MUSCLE SPASMS. Patient not taking: Reported on 03/01/2017 02/27/17   [name], MD    Family History Family History  Problem Relation Age of Onset  . Breast cancer Mother   . COPD Father   . Emphysema Father   . Throat cancer Sister   . Glaucoma Brother   . Schizophrenia Brother   . Heart attack Brother   . Lung cancer Sister        former smoker  . Hepatitis C  Daughter   . Seizures Daughter   . SIDS Son     Social History Social History   Tobacco Use  . Smoking status: Current Every Day Smoker    Packs/day: 1.00    Years: 50.00    Pack years: 50.00    Types: Cigarettes  . Smokeless tobacco: Never Used  . Tobacco comment: cigaretes  now 10 to 15 per day  Substance Use Topics  . Alcohol use: No    Alcohol/week: 0.0 oz  . Drug use: No     Allergies   Penicillins and Sulfonamide derivatives   Review of Systems Review of Systems  All other systems reviewed  and are negative.    Physical Exam Updated Vital Signs BP (!) 169/79 (BP Location: Right Arm)   Pulse 94   Temp 98.3 F (36.8 C) (Oral)   Resp 18   Wt 71.2 kg (157 lb)   SpO2 98%   BMI 26.95 kg/m   Physical Exam  Constitutional: She is oriented to person, place, and time. She appears well-developed and well-nourished.  HENT:  Head: Normocephalic and atraumatic.  Eyes: Conjunctivae are normal.  Neck: Neck supple.  Cardiovascular: Normal rate and regular rhythm.  Pulmonary/Chest: Effort normal and breath sounds normal.  Abdominal: Soft. Bowel sounds are normal. There is tenderness in the left lower quadrant.  Musculoskeletal: Normal range of motion.  Neurological: She is alert and oriented to person, place, and time.  Skin: Skin is warm and dry.  Psychiatric: She has a normal mood and affect. Her behavior is normal.  Nursing note and vitals reviewed.    ED Treatments / Results  Labs (all labs ordered are listed, but only abnormal results are displayed) Labs Reviewed  COMPREHENSIVE METABOLIC PANEL - Abnormal; Notable for the following components:      Result Value   BUN 22 (*)    All other components within normal limits  CBC - Abnormal; Notable for the following components:   RBC 5.13 (*)    HCT 47.5 (*)    All other components within normal limits  URINALYSIS, ROUTINE W REFLEX MICROSCOPIC - Abnormal; Notable for the following components:   APPearance  HAZY (*)    Protein, ur 30 (*)    Nitrite POSITIVE (*)    Leukocytes, UA LARGE (*)    Bacteria, UA FEW (*)    Squamous Epithelial / LPF 0-5 (*)    All other components within normal limits  LIPASE, BLOOD    EKG  EKG Interpretation None       Radiology Ct Abdomen Pelvis W Contrast  Result Date: 03/01/2017 CLINICAL DATA:  Left-sided abdominal pain for 2 months EXAM: CT ABDOMEN AND PELVIS WITH CONTRAST TECHNIQUE: Multidetector CT imaging of the abdomen and pelvis was performed using the standard protocol following bolus administration of intravenous contrast. CONTRAST:  155m ISOVUE-300 IOPAMIDOL (ISOVUE-300) INJECTION 61% COMPARISON:  10/03/2016 FINDINGS: Lower chest: No acute abnormality. Hepatobiliary: No focal liver abnormality is seen. Status post cholecystectomy. No biliary dilatation. Pancreas: Unremarkable. No pancreatic ductal dilatation or surrounding inflammatory changes. Spleen: Normal in size without focal abnormality. Adrenals/Urinary Tract: Adrenal glands are unremarkable. Kidneys are normal, without renal calculi, focal lesion, or hydronephrosis. Bladder is unremarkable. Previously seen renal calculi are no longer identified. Stomach/Bowel: Stomach is within normal limits. Appendix appears normal. No evidence of bowel wall thickening, distention, or inflammatory changes. Vascular/Lymphatic: Aortic atherosclerosis. No enlarged abdominal or pelvic lymph nodes. Circumaortic left renal vein is noted. Reproductive: Status post hysterectomy. No adnexal masses. Other: No abdominal wall hernia or abnormality. No abdominopelvic ascites. Musculoskeletal: Mild degenerative changes of lumbar spine are noted. IMPRESSION: No acute abnormality noted. Previously seen renal calculi are no longer present. Electronically Signed   By: MInez CatalinaM.D.   On: 03/01/2017 16:10    Procedures Procedures (including critical care time)  Medications Ordered in ED Medications  iopamidol (ISOVUE-300) 61  % injection 100 mL (100 mLs Intravenous Contrast Given 03/01/17 1547)  cefTRIAXone (ROCEPHIN) 1 g in sodium chloride 0.9 % 100 mL IVPB (0 g Intravenous Stopped 03/01/17 1832)     Initial Impression / Assessment and Plan / ED Course  I have reviewed the  triage vital signs and the nursing notes.  Pertinent labs & imaging results that were available during my care of the patient were reviewed by me and considered in my medical decision making (see chart for details).     Patient presents with left lower quadrant pain for 2 months.  She does not have an acute abdomen CT scan of abdomen and pelvis showed no acute findings.  She does have evidence of a urinary tract infection.  Will Rx Rocephin 1 g IV.  Discharge medication Keflex 500 mg.  Urine culture.  Follow-up with primary care.  Final Clinical Impressions(s) / ED Diagnoses   Final diagnoses:  Abdominal pain, unspecified abdominal location  Urinary tract infection without hematuria, site unspecified    ED Discharge Orders        Ordered    cephALEXin (KEFLEX) 500 MG capsule  4 times daily     03/01/17 1818       Nat Christen, MD 03/01/17 1858

## 2017-03-01 NOTE — ED Notes (Signed)
unsucessfl IV attempt to R FA

## 2017-03-01 NOTE — Discharge Instructions (Signed)
CT scan showed no acute findings.  You do have a urinary tract infection.  Increase fluids.  Prescription for antibiotic.  Follow-up with your primary care doctor.

## 2017-03-01 NOTE — ED Notes (Signed)
Gave pt a meal tray  

## 2017-03-01 NOTE — ED Notes (Signed)
From CT 

## 2017-03-06 ENCOUNTER — Encounter (HOSPITAL_COMMUNITY): Payer: Self-pay

## 2017-03-06 ENCOUNTER — Telehealth: Payer: Self-pay | Admitting: Family Medicine

## 2017-03-06 ENCOUNTER — Ambulatory Visit (HOSPITAL_COMMUNITY)
Admission: RE | Admit: 2017-03-06 | Discharge: 2017-03-06 | Disposition: A | Discharge status: Home / Self Care | Payer: Medicare Other | Source: Ambulatory Visit | Attending: Family Medicine | Admitting: Family Medicine

## 2017-03-06 DIAGNOSIS — Z1231 Encounter for screening mammogram for malignant neoplasm of breast: Secondary | ICD-10-CM | POA: Insufficient documentation

## 2017-03-06 NOTE — Telephone Encounter (Signed)
LVM on 03-05-17 that Niece can be her Aides Donneta Romberg does she need to [phone]

## 2017-03-06 NOTE — Telephone Encounter (Signed)
I don't know how to go about this. I only have the forms to fill out to request an aid through [facility] but it doesn't give me the option to request a family member to provide services. If they have more information about this or a form, let me know

## 2017-03-11 NOTE — Telephone Encounter (Signed)
LVM with [name], that we do not have a form to request a family member, if she could get more information or the form that we can fill out, to please let us know.

## 2017-03-13 NOTE — Congregational Nurse Program (Signed)
B

## 2017-03-13 NOTE — Congregational Nurse Program (Signed)
Congregational Nurse Program Note  Date of Encounter: 03/13/2017  Past Medical History: Past Medical History:  Diagnosis Date  . Breast cancer ([place]) 2007   Stage I (T1b N0 M0), grade 1 well-differentiated carcinoma of the left breast status, post lumpectomy followed by radiation therapy. Her estrogen receptor receptors were 93%, progesterone receptors 67%. HER-2/neu was negative. No lymphovascular space invasion was seen. All margins were clear. Ki-67 marker was low at 1% with surgery on 11/15/2004. Treated then with post-lumpectomy radiation, finish  . Breast cancer, left breast ([place]) 2007  . COPD (chronic obstructive pulmonary disease) ([place])   . Diabetes mellitus without complication ([place])   . Diverticula of colon   . Hyperlipidemia   . Hypertension   . Kidney stones   . Nicotine dependence   . Osteoarthritis     Encounter Details: CNP Questionnaire - 03/06/17 1120      Questionnaire   Patient Status  Not Applicable    Race  White or Caucasian    Location Patient Served At  [employer];Medicare;Private Insurance    Uninsured  Not Applicable    Food  No food insecurities    Housing/Utilities  Yes, have permanent housing    Transportation  No transportation needs    Interpersonal Safety  Yes, feel physically and emotionally safe where you currently live    Medication  No medication insecurities    Medical Provider  Yes    Referrals  Primary Care Provider/Clinic    ED Visit Averted  Not Applicable    Life-Saving Intervention Made  Not Applicable     Pt seen while at the [employer] B/P 162/74, P 93.Stated she has MD appt on next week

## 2017-03-14 ENCOUNTER — Ambulatory Visit (HOSPITAL_COMMUNITY)
Admission: RE | Admit: 2017-03-14 | Discharge: 2017-03-14 | Disposition: A | Discharge status: Home / Self Care | Payer: Medicare Other | Source: Ambulatory Visit | Attending: Family Medicine | Admitting: Family Medicine

## 2017-03-14 DIAGNOSIS — N83201 Unspecified ovarian cyst, right side: Secondary | ICD-10-CM | POA: Diagnosis not present

## 2017-03-14 DIAGNOSIS — Z90710 Acquired absence of both cervix and uterus: Secondary | ICD-10-CM | POA: Insufficient documentation

## 2017-03-14 DIAGNOSIS — R102 Pelvic and perineal pain: Secondary | ICD-10-CM

## 2017-03-28 ENCOUNTER — Other Ambulatory Visit: Payer: Self-pay | Admitting: Orthopaedic Surgery

## 2017-04-01 ENCOUNTER — Other Ambulatory Visit: Payer: Self-pay | Admitting: Family Medicine

## 2017-04-04 ENCOUNTER — Other Ambulatory Visit: Payer: Self-pay

## 2017-04-04 ENCOUNTER — Ambulatory Visit (INDEPENDENT_AMBULATORY_CARE_PROVIDER_SITE_OTHER): Payer: Medicare Other | Admitting: Gastroenterology

## 2017-04-04 ENCOUNTER — Encounter: Payer: Self-pay | Admitting: Gastroenterology

## 2017-04-04 DIAGNOSIS — K921 Melena: Secondary | ICD-10-CM | POA: Diagnosis not present

## 2017-04-04 DIAGNOSIS — R195 Other fecal abnormalities: Secondary | ICD-10-CM | POA: Diagnosis not present

## 2017-04-04 MED ORDER — NA SULFATE-K SULFATE-MG SULF 17.5-3.13-1.6 GM/177ML PO SOLN: 1.0000 | ORAL | 0 refills | Status: DC

## 2017-04-04 MED ORDER — CLENPIQ 10-3.5-12 MG-GM -GM/160ML PO SOLN: 1.0000 | Freq: Once | ORAL | 0 refills | Status: AC

## 2017-04-04 NOTE — Assessment & Plan Note (Addendum)
NO BRBPR OR MELENA. DIFFERENTIAL DIAGNOSIS INCLUDES HEMORRHOIDS, COLON POLYPS, AVMs, & LESS LIKELY COLON CA.   DRINK WATER TO KEEP YOUR URINE LIGHT YELLOW. FOLLOW A HIGH FIBER DIET.  HANDOUT GIVEN. Stop taking aspirin.  COMPLETE TCS IN 2-4 WEEKS. YOU MAY BRING THE ENEMA TO ADMINISTER IN THE PREOP AREA. DISCUSSED PROCEDURE, BENEFITS, & RISKS: < 1% chance of medication reaction, bleeding, perforation, or rupture of spleen/liver.  FOLLOW UP IN 6 MOS.

## 2017-04-04 NOTE — Progress Notes (Signed)
CC'ED TO PCP 

## 2017-04-04 NOTE — Progress Notes (Signed)
ON RECALL  °

## 2017-04-04 NOTE — Progress Notes (Signed)
Subjective:    Patient ID: [name], female    DOB: 11/08/1945, 72 y.o.   MRN: [id number]  Tanya Helper, MD  HPI Pt had blood detected in her stool. LAST TCS IN 2012 BY [facility]. HAS PAIN IN LLQ WHEN SHE COUGH SOMETIMES.  HAD GB REMOVED IN 2016 AND BMs: #5, MAY BE CONSTIPATED OFF AND ON #3-4. HAS A LITTLE BIT OF CHRONIC LEFT CHEST PAIN. TROUBLE WITH BRONCHITIS OFTEN AND SOB BASELINE. RARE TROUBLE SWALLOWING WHEN SHE IS IN A HURRY EATING. WHEN SHE FOUND BLOOD HAD GREENISH BLACK LOOKING STOOL/LIKE TAR(FEB 2019). TAKES 81 MG DAILY FOR "BP". 1/2 CIGS/DAY. ETOH: NONE, NEVER DAILY ETOH  PT DENIES FEVER, CHILLS, HEMATOCHEZIA, HEMATEMESIS, nausea, vomiting, melena, CHANGE IN BOWEL IN HABITS, problems swallowing, problems with sedation, OR heartburn or indigestion.  Past Medical History:  Diagnosis Date  . Breast cancer ([facility]) 2007   Stage I (T1b N0 M0), grade 1 well-differentiated carcinoma of the left breast status, post lumpectomy followed by radiation therapy. Her estrogen receptor receptors were 93%, progesterone receptors 67%. HER-2/neu was negative. No lymphovascular space invasion was seen. All margins were clear. Ki-67 marker was low at 1% with surgery on 11/15/2004. Treated then with post-lumpectomy radiation, finish  . Breast cancer, left breast ([place]) 2007  . COPD (chronic obstructive pulmonary disease) ([place])   . Diabetes mellitus without complication ([place])   . Diverticula of colon   . Hyperlipidemia   . Hypertension   . Kidney stones   . Nicotine dependence   . Osteoarthritis    Past Surgical History:  Procedure Laterality Date  . ABDOMINAL HYSTERECTOMY    . BREAST SURGERY  2005 approx   left lumpectomy   . CHOLECYSTECTOMY N/A 05/26/2014   Procedure: LAPAROSCOPIC CHOLECYSTECTOMY;  Surgeon: Aviva Signs Md, MD;  Location: [facility];  Service: General;  Laterality: N/A;  . COLECTOMY     2005, diverticulitis  . DILATION AND CURETTAGE OF UTERUS    . left breast      cancer, in  2006  . TUBAL LIGATION      Allergies  Allergen Reactions  . Penicillins Hives, Shortness Of Breath and Swelling      . Sulfonamide Derivatives Other (See Comments)    Shaking all over, seizure like symptoms. Hospitalization resulted     Current Outpatient Medications  Medication Sig    . acetaminophen (TYLENOL) 500 MG tablet Take 500-1,000 mg by mouth every 6 (six) hours as needed for moderate pain.     Marland Kitchen amLODipine (NORVASC) 5 MG tablet Take 1 tablet (5 mg total) by mouth daily.    Marland Kitchen aspirin EC 81 MG tablet Take 81 mg by mouth daily.    . ATROVENT HFA 17 MCG/ACT inhaler INHALE 2 PUFFS BY MOUTH FOUR TIMES A DAY.    Marland Kitchen azelastine (ASTELIN) 0.1 % nasal spray PLACE 2 SPRAYS INTO EACH NOSTRIL TWICE DAILY.    . benazepril (LOTENSIN) 40 MG tablet TAKE ONE TABLET BY MOUTH DAILY.    . Calcium Carbonate-Vitamin D (CALCIUM 600 + D PO) Take 1 tablet by mouth 2 (two) times daily.     Marland Kitchen loratadine (CLARITIN) 10 MG tablet TAKE 1 TABLET BY MOUTH ONCE DAILY FOR ALLERGIES.    Marland Kitchen montelukast (SINGULAIR) 10 MG tablet TAKE 1 TABLET BY MOUTH AT BEDTIME.    Marland Kitchen Omega-3 Fatty Acids (FISH OIL) 1000 MG CAPS Take 1 capsule by mouth 2 (two) times daily.     . polyethylene glycol powder (GLYCOLAX/MIRALAX) powder MIX  1 CAPFUL IN 8 OUNCES OF JUICE OR WATER AND DRINK ONCE DAILY.    Marland Kitchen potassium chloride SA (K-DUR,KLOR-CON) 20 MEQ tablet TAKE 1 TABLET BY MOUTH DAILY.    . simvastatin (ZOCOR) 20 MG tablet TAKE (1) TABLET BY MOUTH DAILY AT 6 PM FOR CHOLESTEROL.    . SYMBICORT 160-4.5 MCG/ACT inhaler INHALE 2 PUFFS INTO THE LUNGS TWICE DAILY.    Marland Kitchen tiZANidine (ZANAFLEX) 4 MG tablet TAKE ONE TABLET EVERY 8 HOURS AS NEEDED FOR MUSCLE SPASMS.    Marland Kitchen traMADol (ULTRAM) 50 MG tablet TAKE (1) TABLET BY MOUTH EVERY SIX HOURS AS NEEDED.    .      .      .      .      .      .        Family History  Problem Relation Age of Onset  . Breast cancer Mother   . COPD Father   . Emphysema Father   . Throat cancer Sister   .  Glaucoma Brother   . Schizophrenia Brother   . Heart attack Brother   . Lung cancer Sister        former smoker  . Hepatitis C Daughter   . Seizures Daughter   . SIDS Son   . Colon cancer Neg Hx   . Colon polyps Neg Hx    Social History   Socioeconomic History  . Marital status: Single    Spouse name: Not on file  . Number of children: Not on file  . Years of education: Not on file  . Highest education level: Not on file  Occupational History  . Occupation: retired    Fish farm manager: RETIRED  Social Needs  . Financial resource strain: Not on file  . Food insecurity:    Worry: Not on file    Inability: Not on file  . Transportation needs:    Medical: Not on file    Non-medical: Not on file  Tobacco Use  . Smoking status: Current Every Day Smoker    Packs/day: 1.00    Years: 50.00    Pack years: 50.00    Types: Cigarettes  . Smokeless tobacco: Never Used  . Tobacco comment: cigaretes  now 10 to 15 per day  Substance and Sexual Activity  . Alcohol use: No    Alcohol/week: 0.0 oz  . Drug use: No  . Sexual activity: Never  Lifestyle  . Physical activity:    Days per week: Not on file    Minutes per session: Not on file  . Stress: Not on file  Relationships  . Social connections:    Talks on phone: Not on file    Gets together: Not on file    Attends religious service: Not on file    Active member of club or organization: Not on file    Attends meetings of clubs or organizations: Not on file    Relationship status: Not on file  Other Topics Concern  . 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 QUIT WORKING 2003 AFTER CANCER DIAGNOSIS.      MARRIED WITH A RUN AWAY HUSBAND(FUGITIVE 10 YRS). 3 MISCARRIAGES, 5 CHILDREN   Review of Systems PER HPI OTHERWISE ALL SYSTEMS ARE NEGATIVE.    Objective:   Physical Exam  Constitutional: She is oriented to person, place, and time. She appears well-developed and  well-nourished. No distress.  HENT:  Head: Normocephalic  and atraumatic.  Mouth/Throat: Oropharynx is clear and moist. No oropharyngeal exudate.  POOR DENTITION  Eyes: Pupils are equal, round, and reactive to light. No scleral icterus.  Neck: Normal range of motion. Neck supple.  Cardiovascular: Normal rate, regular rhythm and normal heart sounds.  Pulmonary/Chest: Effort normal and breath sounds normal. No respiratory distress.  Abdominal: Soft. Bowel sounds are normal. She exhibits no distension. There is tenderness. There is no rebound and no guarding.  MILD RUQ AND LLQs TTP  Musculoskeletal: She exhibits no edema.  Lymphadenopathy:    She has no cervical adenopathy.  Neurological: She is alert and oriented to person, place, and time.  NO  NEW FOCAL DEFICITS, BASELINE TREMORS  Psychiatric:  SLIGHTLY ANXIOUS MOOD, NL AFFECT  Vitals reviewed.     Assessment & Plan:

## 2017-04-04 NOTE — Assessment & Plan Note (Signed)
One episode in FEB 2019 WITH  L Hb.  DRINK WATER TO KEEP YOUR URINE LIGHT YELLOW. FOLLOW A HIGH FIBER DIET.  HANDOUT GIVEN. Stop taking aspirin. COMPLETE UPPER ENDOSCOPY IN 2-4 WEEKS. DISCUSSED PROCEDURE, BENEFITS, & RISKS: < 1% chance of medication reaction, bleeding, OR perforation. FOLLOW UP IN 6 MOS.

## 2017-04-04 NOTE — Patient Instructions (Addendum)
DRINK WATER TO KEEP YOUR URINE LIGHT YELLOW.  FOLLOW A HIGH FIBER DIET. SEE INFO BELOW.   Stop taking aspirin.  COMPLETE COLONOSCOPY AND UPPER ENDOSCOPY IN 2-4 WEEKS. YOU MAY BRING THE ENEMA TO ADMINISTER IN THE PREOP AREA.   FOLLOW UP IN 6 MOS.    High-Fiber Diet A high-fiber diet changes your normal diet to include more whole grains, legumes, fruits, and vegetables. Changes in the diet involve replacing refined carbohydrates with unrefined foods. The calorie level of the diet is essentially unchanged. The Dietary Reference Intake (recommended amount) for adult males is 38 grams per day. For adult females, it is 25 grams per day. Pregnant and lactating women should consume 28 grams of fiber per day. Fiber is the intact part of a plant that is not broken down during digestion. Functional fiber is fiber that has been isolated from the plant to provide a beneficial effect in the body. PURPOSE  Increase stool bulk.   Ease and regulate bowel movements.   Lower cholesterol.  INDICATIONS THAT YOU NEED MORE FIBER  Constipation and hemorrhoids.   Uncomplicated diverticulosis (intestine condition) and irritable bowel syndrome.   Weight management.   As a protective measure against hardening of the arteries (atherosclerosis), diabetes, and cancer.   GUIDELINES FOR INCREASING FIBER IN THE DIET  Start adding fiber to the diet slowly. A gradual increase of about 5 more grams (2 slices of whole-wheat bread, 2 servings of most fruits or vegetables, or 1 bowl of high-fiber cereal) per day is best. Too rapid an increase in fiber may result in constipation, flatulence, and bloating.   Drink enough water and fluids to keep your urine clear or pale yellow. Water, juice, or caffeine-free drinks are recommended. Not drinking enough fluid may cause constipation.   Eat a variety of high-fiber foods rather than one type of fiber.   Try to increase your intake of fiber through using high-fiber foods  rather than fiber pills or supplements that contain small amounts of fiber.   The goal is to change the types of food eaten. Do not supplement your present diet with high-fiber foods, but replace foods in your present diet.  INCLUDE A VARIETY OF FIBER SOURCES  Replace refined and processed grains with whole grains, canned fruits with fresh fruits, and incorporate other fiber sources. White rice, white breads, and most bakery goods contain little or no fiber.   Brown whole-grain rice, buckwheat oats, and many fruits and vegetables are all good sources of fiber. These include: broccoli, Brussels sprouts, cabbage, cauliflower, beets, sweet potatoes, white potatoes (skin on), carrots, tomatoes, eggplant, squash, berries, fresh fruits, and dried fruits.   Cereals appear to be the richest source of fiber. Cereal fiber is found in whole grains and bran. Bran is the fiber-rich outer coat of cereal grain, which is largely removed in refining. In whole-grain cereals, the bran remains. In breakfast cereals, the largest amount of fiber is found in those with "bran" in their names. The fiber content is sometimes indicated on the label.   You may need to include additional fruits and vegetables each day.   In baking, for 1 cup white flour, you may use the following substitutions:   1 cup whole-wheat flour minus 2 tablespoons.   1/2 cup white flour plus 1/2 cup whole-wheat flour.

## 2017-04-07 NOTE — Progress Notes (Signed)
[place], [place] [address]   CLINIC:  Medical Oncology/Hematology  PCP:  [name], MD [address] [place] [address] [phone]   REASON FOR VISIT:  Follow-up for Stage IA invasive ductal carcinoma of (L) breast; ER+/PR+/HER2-  CURRENT THERAPY:  Observation   BRIEF ONCOLOGIC HISTORY:    Breast cancer ([place])   11/15/2004 Surgery    Lumpectomy       - 02/22/2005 Radiation Therapy         12/11/2004 -  Chemotherapy    Aromasin       Adverse Reaction    Intolerance to Aromasin       - 01/14/2010 Chemotherapy    Femara        HISTORY OF PRESENT ILLNESS:  (From Kirby Crigler, PA-C's note on 04/10/16)      INTERVAL HISTORY:  Ms. Buller 72 y.o. female returns for routine follow-up for h/o left breast cancer.   Here today unaccompanied.  Overall, she tells me she has been feeling "okay."  Appetite 100%; energy level 75%.  She states that she has had recent cough, shortness of breath, fatigue, and chills.  Does endorse fever of 101 about 2-3 days ago; no fever since that time.  She has not called her PCP regarding the symptoms, "I thought I could just talk to my cancer doctor about it today."  She has left-sided neck pain as well, which just recently started, and she attributes this to "my sinus infection I have right now."  She has chronic issues with constipation, for which she takes MiraLAX.  Also with chronic complaints of dizziness, and easy bruising.  Endorses that she has had several falls, in which she loses her footing.  She has healing bruises to her back, which she tells me is "when I fell off of the wood pile I was sitting on at my house."  Another fall occurred when she tells me she fell out of bed because she was startled awake by her infant grandchild.  Denies any syncopal episodes leading to falls.    Denies any new breast complaints.  She does endorse occasional left breast tenderness, which  has been chronic and is unchanged.  Her mammogram was in 02/2017 and was negative.  She does take calcium and vitamin D per her report.  She does try to exercise by walking.  She is scheduled for EGD and colonoscopy with Dr. Oneida Alar in early 05/2017.  She continues to smoke cigarettes; generally ~1/2 pack per day. She is working on cutting back and quitting.   Otherwise, she is largely without other complaints today.    REVIEW OF SYSTEMS:  Review of Systems  Constitutional: Positive for chills, fatigue and fever.  HENT:  Negative.   Eyes: Negative.   Respiratory: Positive for cough and shortness of breath.   Cardiovascular: Negative.  Negative for chest pain and leg swelling.  Gastrointestinal: Positive for constipation.  Endocrine: Negative.   Genitourinary: Negative.    Musculoskeletal: Positive for arthralgias (neck pain ).  Skin: Negative.   Neurological: Positive for dizziness. Negative for headaches.  Hematological: Bruises/bleeds easily.  Psychiatric/Behavioral: Negative.      PAST MEDICAL/SURGICAL HISTORY:  Past Medical History:  Diagnosis Date  . Breast cancer ([facility]) 2007   Stage I (T1b N0 M0), grade 1 well-differentiated carcinoma of the left breast status, post lumpectomy followed by radiation therapy. Her estrogen receptor receptors were 93%, progesterone receptors 67%. HER-2/neu was  negative. No lymphovascular space invasion was seen. All margins were clear. Ki-67 marker was low at 1% with surgery on 11/15/2004. Treated then with post-lumpectomy radiation, finish  . Breast cancer, left breast ([place]) 2007  . COPD (chronic obstructive pulmonary disease) ([facility])   . Diabetes mellitus without complication ([facility])   . Diverticula of colon   . Hyperlipidemia   . Hypertension   . Kidney stones   . Nicotine dependence   . Osteoarthritis    Past Surgical History:  Procedure Laterality Date  . ABDOMINAL HYSTERECTOMY    . BREAST SURGERY  2005 approx   left lumpectomy   .  CHOLECYSTECTOMY N/A 05/26/2014   Procedure: LAPAROSCOPIC CHOLECYSTECTOMY;  Surgeon: Aviva Signs Md, MD;  Location: [facility];  Service: General;  Laterality: N/A;  . COLECTOMY     2005, diverticulitis  . DILATION AND CURETTAGE OF UTERUS    . left breast      cancer, in 2006  . TUBAL LIGATION       SOCIAL HISTORY:  Social History   Socioeconomic History  . Marital status: Single    Spouse name: Not on file  . Number of children: Not on file  . Years of education: Not on file  . Highest education level: Not on file  Occupational History  . Occupation: retired    Fish farm manager: RETIRED  Social Needs  . Financial resource strain: Not on file  . Food insecurity:    Worry: Not on file    Inability: Not on file  . Transportation needs:    Medical: Not on file    Non-medical: Not on file  Tobacco Use  . Smoking status: Current Every Day Smoker    Packs/day: 1.00    Years: 50.00    Pack years: 50.00    Types: Cigarettes  . Smokeless tobacco: Never Used  . Tobacco comment: cigaretes  now 10 to 15 per day  Substance and Sexual Activity  . Alcohol use: No    Alcohol/week: 0.0 oz  . Drug use: No  . Sexual activity: Never  Lifestyle  . Physical activity:    Days per week: Not on file    Minutes per session: Not on file  . Stress: Not on file  Relationships  . Social connections:    Talks on phone: Not on file    Gets together: Not on file    Attends religious service: Not on file    Active member of club or organization: Not on file    Attends meetings of clubs or organizations: Not on file    Relationship status: Not on file  . Intimate partner violence:    Fear of current or ex partner: Not on file    Emotionally abused: Not on file    Physically abused: Not on file    Forced sexual activity: Not on file  Other Topics Concern  . 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 QUIT WORKING 2003 AFTER CANCER DIAGNOSIS.       MARRIED WITH A RUN AWAY HUSBAND(FUGITIVE 10 YRS). 3 MISCARRIAGES, 5 CHILDREN    FAMILY HISTORY:  Family History  Problem Relation Age of Onset  . Breast cancer Mother   . COPD Father   . Emphysema Father   . Throat cancer Sister   . Glaucoma Brother   . Schizophrenia Brother   . Heart attack Brother   . Lung cancer Sister  former smoker  . Hepatitis C Daughter   . Seizures Daughter   . SIDS Son   . Colon cancer Neg Hx   . Colon polyps Neg Hx     CURRENT MEDICATIONS:  Outpatient Encounter Medications as of 04/10/2017  Medication Sig  . acetaminophen (TYLENOL) 500 MG tablet Take 500-1,000 mg by mouth every 6 (six) hours as needed for moderate pain.   Marland Kitchen amLODipine (NORVASC) 5 MG tablet Take 1 tablet (5 mg total) by mouth daily.  . ATROVENT HFA 17 MCG/ACT inhaler INHALE 2 PUFFS BY MOUTH FOUR TIMES A DAY.  Marland Kitchen azelastine (ASTELIN) 0.1 % nasal spray PLACE 2 SPRAYS INTO EACH NOSTRIL TWICE DAILY.  . benazepril (LOTENSIN) 40 MG tablet TAKE ONE TABLET BY MOUTH DAILY.  . Calcium Carbonate-Vitamin D (CALCIUM 600 + D PO) Take 1 tablet by mouth 2 (two) times daily. 600/400  . loratadine (CLARITIN) 10 MG tablet TAKE 1 TABLET BY MOUTH ONCE DAILY FOR ALLERGIES.  Marland Kitchen montelukast (SINGULAIR) 10 MG tablet TAKE 1 TABLET BY MOUTH AT BEDTIME.  Marland Kitchen Omega-3 Fatty Acids (FISH OIL) 1000 MG CAPS Take 1 capsule by mouth 2 (two) times daily.   . polyethylene glycol powder (GLYCOLAX/MIRALAX) powder MIX 1 CAPFUL IN 8 OUNCES OF JUICE OR WATER AND DRINK ONCE DAILY.  Marland Kitchen potassium chloride SA (K-DUR,KLOR-CON) 20 MEQ tablet TAKE 1 TABLET BY MOUTH DAILY.  . simvastatin (ZOCOR) 20 MG tablet TAKE (1) TABLET BY MOUTH DAILY AT 6 PM FOR CHOLESTEROL.  . SYMBICORT 160-4.5 MCG/ACT inhaler INHALE 2 PUFFS INTO THE LUNGS TWICE DAILY.  Marland Kitchen tiZANidine (ZANAFLEX) 4 MG tablet TAKE ONE TABLET EVERY 8 HOURS AS NEEDED FOR MUSCLE SPASMS.  Marland Kitchen traMADol (ULTRAM) 50 MG tablet TAKE (1) TABLET BY MOUTH EVERY SIX HOURS AS NEEDED.  Marland Kitchen  azithromycin (ZITHROMAX Z-PAK) 250 MG tablet Take 2 tabs po on day 1, then 1 tab po daily x 4 days.  . [DISCONTINUED] albuterol (PROVENTIL) (2.5 MG/3ML) 0.083% nebulizer solution USE 1 VIAL IN NEBULIZER 3 TIMES DAILY. (Patient not taking: Reported on 04/04/2017)  . [DISCONTINUED] aspirin EC 81 MG tablet Take 81 mg by mouth daily.  . [DISCONTINUED] azelastine (ASTELIN) 0.1 % nasal spray PLACE 2 SPRAYS INTO EACH NOSTRIL TWICE DAILY.  . [DISCONTINUED] fluticasone (FLONASE) 50 MCG/ACT nasal spray Place 2 sprays into both nostrils daily. (Patient not taking: Reported on 04/04/2017)  . [DISCONTINUED] Na Sulfate-K Sulfate-Mg Sulf (SUPREP BOWEL PREP KIT) 17.5-3.13-1.6 GM/177ML SOLN Take 1 kit by mouth as directed.   No facility-administered encounter medications on file as of 04/10/2017.     ALLERGIES:  Allergies  Allergen Reactions  . Penicillins Hives, Shortness Of Breath and Swelling    Has patient had a PCN reaction causing immediate rash, facial/tongue/throat swelling, SOB or lightheadedness with hypotension: yes Has patient had a PCN reaction causing severe rash involving mucus membranes or skin necrosis:no Has patient had a PCN reaction that required hospitalization: yes Has patient had a PCN reaction occurring within the last 10 years: no If all of the above answers are "NO", then may proceed with Cephalosporin use.  . Sulfonamide Derivatives Other (See Comments)    Shaking all over, seizure like symptoms. Hospitalization resulted      PHYSICAL EXAM:  ECOG Performance status: 1 - Symptomatic; remains independent   Vitals:   04/10/17 1019  BP: 137/75  Pulse: 80  Resp: 20  Temp: 98 F (36.7 C)  SpO2: 97%   Filed Weights   04/10/17 1019  Weight: 153 lb (69.4 kg)  Physical Exam  Constitutional: She is oriented to person, place, and time and well-developed, well-nourished, and in no distress.  HENT:  Head: Normocephalic.  Mouth/Throat: Oropharynx is clear and moist. No  oropharyngeal exudate (mild posterior erythema; no exudate).  Eyes: Pupils are equal, round, and reactive to light. Conjunctivae are normal. No scleral icterus.  Neck: Normal range of motion. Neck supple.  Cardiovascular: Normal rate and regular rhythm.  Pulmonary/Chest: Effort normal. No respiratory distress.    Mild rhonchi noted to bilat upper lobes; diminished bilat bases.  No crackles/rales appreciated.   Abdominal: Soft. Bowel sounds are normal. There is no tenderness.  Musculoskeletal: Normal range of motion. She exhibits no edema.  Lymphadenopathy:    She has no cervical adenopathy.       Right: No supraclavicular adenopathy present.       Left: No supraclavicular adenopathy present.  Neurological: She is alert and oriented to person, place, and time. No cranial nerve deficit. Gait normal.  Skin: Skin is warm and dry. No rash noted.  Psychiatric:  Anxious affect and mood   Nursing note and vitals reviewed.    LABORATORY DATA:  I have reviewed the labs as listed.  CBC    Component Value Date/Time   WBC 9.7 03/01/2017 1155   RBC 5.13 (H) 03/01/2017 1155   HGB 14.9 03/01/2017 1155   HCT 47.5 (H) 03/01/2017 1155   PLT 372 03/01/2017 1155   MCV 92.6 03/01/2017 1155   MCH 29.0 03/01/2017 1155   MCHC 31.4 03/01/2017 1155   RDW 14.0 03/01/2017 1155   LYMPHSABS 2.7 05/21/2014 1327   MONOABS 1.2 (H) 05/21/2014 1327   EOSABS 0.3 05/21/2014 1327   BASOSABS 0.1 05/21/2014 1327   CMP Latest Ref Rng & Units 03/01/2017 10/03/2016 01/18/2016  Glucose 65 - 99 mg/dL 96 96 76  BUN 6 - 20 mg/dL 22(H) 16 14  Creatinine 0.44 - 1.00 mg/dL 0.82 0.69 0.63  Sodium 135 - 145 mmol/L 139 139 142  Potassium 3.5 - 5.1 mmol/L 4.2 4.5 4.4  Chloride 101 - 111 mmol/L 104 103 105  CO2 22 - 32 mmol/L '24 26 23  ' Calcium 8.9 - 10.3 mg/dL 9.8 9.9 9.7  Total Protein 6.5 - 8.1 g/dL 7.8 7.6 6.9  Total Bilirubin 0.3 - 1.2 mg/dL 0.7 0.7 0.4  Alkaline Phos 38 - 126 U/L 71 68 70  AST 15 - 41 U/L '17 18 16    ' ALT 14 - 54 U/L '14 17 12    ' PENDING LABS:    DIAGNOSTIC IMAGING:  *The following radiologic images and reports have been reviewed independently and agree with below findings.  Screening mammogram: 03/06/17 CLINICAL DATA:  Screening. History of left breast cancer in 2006 status post lumpectomy and radiation therapy.  EXAM: DIGITAL SCREENING BILATERAL MAMMOGRAM WITH TOMO AND CAD  COMPARISON:  Previous exam(s).  ACR Breast Density Category b: There are scattered areas of fibroglandular density.  FINDINGS: There are stable postsurgical changes within the left breast. There are no findings suspicious for malignancy within either breast. Images were processed with CAD.  IMPRESSION: No mammographic evidence of malignancy. A result letter of this screening mammogram will be mailed directly to the patient.  RECOMMENDATION: Screening mammogram in one year. (Code:[id number]-B-01Y)  BI-RADS CATEGORY  2: Benign.   Electronically Signed   By: Franki Cabot M.D.   On: 03/06/2017 14:16     PATHOLOGY:  (L) lumpectomy surgical path: 11/15/04        ASSESSMENT & PLAN:  Stage IA invasive ductal carcinoma of (L) breast; ER+/PR+/HER2-:  -Diagnosed in 10/2004.  Treated with length lumpectomy followed by adjuvant radiation therapy, completing treatment on 02/22/05.  Started antiestrogen therapy with Aromasin in 11/2004, which was switched to Femara due to intolerance.  She completed 5 total years of anti-estrogen therapy on 01/14/10. -Last screening mammogram on 03/06/17 negative for malignancy.  She will be due for annual screening mammogram in 02/2018; orders placed today. -Clinical breast exam performed today and benign.  She is now 12+ years out from her initial diagnosis and treatment without evidence of recurrence; this is very favorable.  Discussed the option of "graduating" from follow-up in the cancer center and having her PCP perform her clinical breast exams and ensure  mammography is done.  However, she prefers to be seen at the cancer center annually, which we are happy to continue to accommodate. -Discussed with her an option for follow-up which would include mammography every February, followed by an office visit with [place] in August annually.  This will ensure that patient is either having a breast exam or mammogram every 6 months.  She agrees with this plan. -Return to cancer center in 08/2017, so she can establish care with Dr. Delton Coombes, our new medical oncologist to join [facility].  No oncologic reason for labs.    Bone health:  -Last DEXA scan was on 07/13/13 showed osteopenia with T-score -1.9. Her bone density imaging has been managed by her PCP; will defer any additional imaging to PCP as they deem appropriate.  -Encouraged calcium/vitamin D supplementation and increase weight-bearing exercises as tolerated.    Health maintenance/Wellness:  -Encouraged healthy diet/exercise.  -Maintain follow-up with PCP for age/gender-appropriate cancer screenings, wellness exams, and vaccinations.   -Encouraged her efforts to continue to cut back and quit tobacco use.  She does not drink alcohol.     Recurrent bronchitis:  -Reported cough and fever for past 2-3 weeks.  Fever 101 a few days ago per patient; no fevers since. Afebrile and vital signs stable in clinic.  Upper lobes of lungs with mild rhonchi. Not clinically concerned about pneumonia.   She has not tried to contact her PCP for her symptoms.  I will give her a prescription for Z-pak (which was e-scribed to her pharmacy).  Gave her strict instructions to contact her PCP if her fever recurs or her symptoms worsen, as she may need additional antibiotics or steroids with additional evaluation/management.  She agreed with this plan.        Dispo:  -Return to cancer center in 08/2017 to establish care with Dr. Delton Coombes; no labs needed.  -Annual screening mammogram in 02/2018; orders placed  today.    All questions were answered to patient's stated satisfaction. Encouraged patient to call with any new concerns or questions before her next visit to the cancer center and we can certain see her sooner, if needed.      Orders placed this encounter:  Orders Placed This Encounter  Procedures  . MM SCREENING BREAST TOMO BILATERAL      Mike Craze, NP [place] [phone]

## 2017-04-08 ENCOUNTER — Other Ambulatory Visit: Payer: Self-pay | Admitting: Family Medicine

## 2017-04-10 ENCOUNTER — Inpatient Hospital Stay (HOSPITAL_COMMUNITY): Payer: Medicare Other | Attending: Adult Health | Admitting: Adult Health

## 2017-04-10 ENCOUNTER — Other Ambulatory Visit: Payer: Self-pay

## 2017-04-10 ENCOUNTER — Encounter (HOSPITAL_COMMUNITY): Payer: Self-pay | Admitting: Adult Health

## 2017-04-10 VITALS — BP 137/75 | HR 80 | Temp 98.0°F | Resp 20 | Wt 153.0 lb

## 2017-04-10 DIAGNOSIS — Z9223 Personal history of estrogen therapy: Secondary | ICD-10-CM | POA: Diagnosis not present

## 2017-04-10 DIAGNOSIS — M1990 Unspecified osteoarthritis, unspecified site: Secondary | ICD-10-CM | POA: Insufficient documentation

## 2017-04-10 DIAGNOSIS — M8580 Other specified disorders of bone density and structure, unspecified site: Secondary | ICD-10-CM | POA: Insufficient documentation

## 2017-04-10 DIAGNOSIS — Z79899 Other long term (current) drug therapy: Secondary | ICD-10-CM | POA: Diagnosis not present

## 2017-04-10 DIAGNOSIS — Z923 Personal history of irradiation: Secondary | ICD-10-CM | POA: Diagnosis not present

## 2017-04-10 DIAGNOSIS — Z853 Personal history of malignant neoplasm of breast: Secondary | ICD-10-CM | POA: Diagnosis not present

## 2017-04-10 DIAGNOSIS — J40 Bronchitis, not specified as acute or chronic: Secondary | ICD-10-CM

## 2017-04-10 DIAGNOSIS — Z170 Estrogen receptor positive status [ER+]: Secondary | ICD-10-CM

## 2017-04-10 DIAGNOSIS — Z1231 Encounter for screening mammogram for malignant neoplasm of breast: Secondary | ICD-10-CM

## 2017-04-10 DIAGNOSIS — Z87442 Personal history of urinary calculi: Secondary | ICD-10-CM | POA: Diagnosis not present

## 2017-04-10 DIAGNOSIS — E785 Hyperlipidemia, unspecified: Secondary | ICD-10-CM | POA: Insufficient documentation

## 2017-04-10 DIAGNOSIS — K5900 Constipation, unspecified: Secondary | ICD-10-CM | POA: Insufficient documentation

## 2017-04-10 DIAGNOSIS — E119 Type 2 diabetes mellitus without complications: Secondary | ICD-10-CM | POA: Insufficient documentation

## 2017-04-10 DIAGNOSIS — F17210 Nicotine dependence, cigarettes, uncomplicated: Secondary | ICD-10-CM | POA: Insufficient documentation

## 2017-04-10 DIAGNOSIS — I10 Essential (primary) hypertension: Secondary | ICD-10-CM | POA: Insufficient documentation

## 2017-04-10 DIAGNOSIS — J449 Chronic obstructive pulmonary disease, unspecified: Secondary | ICD-10-CM | POA: Insufficient documentation

## 2017-04-10 DIAGNOSIS — J329 Chronic sinusitis, unspecified: Secondary | ICD-10-CM

## 2017-04-10 DIAGNOSIS — Z7982 Long term (current) use of aspirin: Secondary | ICD-10-CM | POA: Insufficient documentation

## 2017-04-10 DIAGNOSIS — C50212 Malignant neoplasm of upper-inner quadrant of left female breast: Secondary | ICD-10-CM

## 2017-04-10 MED ORDER — AZITHROMYCIN 250 MG PO TABS: ORAL_TABLET | ORAL | 0 refills | Status: DC

## 2017-04-10 NOTE — Patient Instructions (Signed)
[place] at [facility] Discharge Instructions  You were seen today by Mike Craze, NP  See schedulers up front for appointments    Thank you for choosing [place] at [facility] to provide your oncology and hematology care.  To afford each patient quality time with our provider, please arrive at least 15 minutes before your scheduled appointment time.   If you have a lab appointment with the [facility] please come in thru the  Main Entrance and check in at the main information desk  You need to re-schedule your appointment should you arrive 10 or more minutes late.  We strive to give you quality time with our providers, and arriving late affects you and other patients whose appointments are after yours.  Also, if you no show three or more times for appointments you may be dismissed from the clinic at the providers discretion.     Again, thank you for choosing [facility].  Our hope is that these requests will decrease the amount of time that you wait before being seen by our physicians.       _____________________________________________________________  Should you have questions after your visit to [facility], please contact our office at [phone] between the hours of 8:30 a.m. and 4:30 p.m.  Voicemails left after 4:30 p.m. will not be returned until the following business day.  For prescription refill requests, have your pharmacy contact our office.       Resources For Cancer Patients and their Caregivers ? American Cancer Society: Can assist with transportation, wigs, general needs, runs Look Good Feel Better.        240-636-0679 ? Cancer Care: Provides financial assistance, online support groups, medication/co-pay assistance.  1-800-813-HOPE (680) 450-6203) ? [facility] Assists [place] cancer patients and their families through emotional , educational and financial  support.  845-347-1403 ? [place] DSS Where to apply for food stamps, Medicaid and utility assistance. 414-773-1285 ? [facility]: Transportation to medical appointments. 704 521 9657 ? Social Security Administration: May apply for disability if have a Stage IV cancer. (772)549-3613 8044041071 ? [employer], Disability and Transit Services: Assists with nutrition, care and transit needs. [place] Support Programs:   > Cancer Support Group  2nd Tuesday of the month 1pm-2pm, Journey Room   > Creative Journey  3rd Tuesday of the month 1130am-1pm, Journey Room

## 2017-04-23 ENCOUNTER — Telehealth: Payer: Self-pay | Admitting: Family Medicine

## 2017-04-23 ENCOUNTER — Ambulatory Visit: Payer: Medicare Other | Admitting: Family Medicine

## 2017-04-23 ENCOUNTER — Encounter: Payer: Self-pay | Admitting: Family Medicine

## 2017-04-23 ENCOUNTER — Other Ambulatory Visit: Payer: Self-pay

## 2017-04-23 ENCOUNTER — Ambulatory Visit (INDEPENDENT_AMBULATORY_CARE_PROVIDER_SITE_OTHER): Payer: Medicare Other | Admitting: Family Medicine

## 2017-04-23 VITALS — BP 148/70 | HR 105 | Temp 100.0°F | Resp 22 | Ht 63.0 in | Wt 154.1 lb

## 2017-04-23 DIAGNOSIS — J441 Chronic obstructive pulmonary disease with (acute) exacerbation: Secondary | ICD-10-CM

## 2017-04-23 MED ORDER — DOXYCYCLINE HYCLATE 100 MG PO TABS: 100.0000 mg | ORAL_TABLET | Freq: Two times a day (BID) | ORAL | 0 refills | Status: DC

## 2017-04-23 MED ORDER — BENZONATATE 100 MG PO CAPS: 100.0000 mg | ORAL_CAPSULE | Freq: Two times a day (BID) | ORAL | 0 refills | Status: DC | PRN

## 2017-04-23 MED ORDER — PREDNISONE 20 MG PO TABS: 20.0000 mg | ORAL_TABLET | Freq: Two times a day (BID) | ORAL | 0 refills | Status: DC

## 2017-04-23 NOTE — Telephone Encounter (Signed)
It has been sent in. She went too the pharmacy too soon after visit. It usually takes them 1 hour after receiving for the med to be ready for collection

## 2017-04-23 NOTE — Progress Notes (Signed)
Chief Complaint  Patient presents with  . Cough    x 1-2 weeks  . Ear Pain    left ear  . runny nose  . sneezing   Usual patient of [name] MD Patient is here today for an acute visit She is a smoker with underlying COPD/bronchitis She has had a cough for about 2 weeks.  She has some runny stuffy nose and sneezing.  She thought it was allergies and was treated with over-the-counter medicines.  She is continuing to cough.  She is coughing up "green junk".  She states that she is having more wheezing.  She has had to use her albuterol more frequently.  No chest pain.  No fever or chills.  No headache.  Mild ear pain.  She has not been around anybody with influenza or pneumonia.  She is up-to-date with influenza and pneumonia vaccinations.  Patient Active Problem List   Diagnosis Date Noted  . Melena 04/04/2017  . Heme positive stool 04/04/2017  . Pelvic pain in female 01/28/2017  . Allergic rhinitis 01/04/2015  . 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[place]) 09/26/2014  . Acute bronchitis with COPD ([place] [place]) 06/30/2014  . Hydrosalpinx 09/28/2013  . Annual physical exam 09/17/2013  . Osteopenia 07/15/2013  . postmenopausal ovarian cyst 04/06/2013  . Pulmonary nodules 04/04/2012  . Breast cancer ([place]) 03/11/2012  . Diabetes mellitus type 2, diet-controlled ([place]) 09/13/2011  . Ganglion cyst 05/08/2011  . COPD (chronic obstructive pulmonary disease) with chronic bronchitis ([place]) 07/24/2009  . Hyperlipidemia LDL goal <100 03/09/2009  . CONSTIPATION, CHRONIC 03/09/2009  . NICOTINE ADDICTION 10/10/2008  . Hypertension goal BP (blood pressure) < 130/80 09/29/2008  . GENERALIZED OSTEOARTHROSIS UNSPECIFIED SITE 09/29/2008    Outpatient Encounter Medications as of 04/23/2017  Medication Sig  . acetaminophen (TYLENOL) 500 MG tablet Take 500-1,000 mg by mouth every 6 (six) hours as needed for moderate pain.   Marland Kitchen amLODipine (NORVASC) 5 MG tablet Take 1 tablet (5 mg total) by mouth daily.  . ATROVENT HFA 17  MCG/ACT inhaler INHALE 2 PUFFS BY MOUTH FOUR TIMES A DAY.  Marland Kitchen azelastine (ASTELIN) 0.1 % nasal spray PLACE 2 SPRAYS INTO EACH NOSTRIL TWICE DAILY.  . benazepril (LOTENSIN) 40 MG tablet TAKE ONE TABLET BY MOUTH DAILY.  . Calcium Carbonate-Vitamin D (CALCIUM 600 + D PO) Take 1 tablet by mouth 2 (two) times daily. 600/400  . loratadine (CLARITIN) 10 MG tablet TAKE 1 TABLET BY MOUTH ONCE DAILY FOR ALLERGIES.  Marland Kitchen montelukast (SINGULAIR) 10 MG tablet TAKE 1 TABLET BY MOUTH AT BEDTIME.  Marland Kitchen Omega-3 Fatty Acids (FISH OIL) 1000 MG CAPS Take 1 capsule by mouth 2 (two) times daily.   . polyethylene glycol powder (GLYCOLAX/MIRALAX) powder MIX 1 CAPFUL IN 8 OUNCES OF JUICE OR WATER AND DRINK ONCE DAILY.  Marland Kitchen potassium chloride SA (K-DUR,KLOR-CON) 20 MEQ tablet TAKE 1 TABLET BY MOUTH DAILY.  . simvastatin (ZOCOR) 20 MG tablet TAKE (1) TABLET BY MOUTH DAILY AT 6 PM FOR CHOLESTEROL.  . SYMBICORT 160-4.5 MCG/ACT inhaler INHALE 2 PUFFS INTO THE LUNGS TWICE DAILY.  Marland Kitchen tiZANidine (ZANAFLEX) 4 MG tablet TAKE ONE TABLET EVERY 8 HOURS AS NEEDED FOR MUSCLE SPASMS.  Marland Kitchen traMADol (ULTRAM) 50 MG tablet TAKE (1) TABLET BY MOUTH EVERY SIX HOURS AS NEEDED.  Marland Kitchen azithromycin (ZITHROMAX Z-PAK) 250 MG tablet Take 2 tabs po on day 1, then 1 tab po daily x 4 days. (Patient not taking: Reported on 04/23/2017)  . benzonatate (TESSALON) 100 MG capsule Take 1 capsule (100  mg total) by mouth 2 (two) times daily as needed for cough.  . doxycycline (VIBRA-TABS) 100 MG tablet Take 1 tablet (100 mg total) by mouth 2 (two) times daily.  . predniSONE (DELTASONE) 20 MG tablet Take 1 tablet (20 mg total) by mouth 2 (two) times daily with a meal.   No facility-administered encounter medications on file as of 04/23/2017.     Allergies  Allergen Reactions  . Penicillins Hives, Shortness Of Breath and Swelling    Has patient had a PCN reaction causing immediate rash, facial/tongue/throat swelling, SOB or lightheadedness with hypotension: yes Has patient had  a PCN reaction causing severe rash involving mucus membranes or skin necrosis:no Has patient had a PCN reaction that required hospitalization: yes Has patient had a PCN reaction occurring within the last 10 years: no If all of the above answers are "NO", then may proceed with Cephalosporin use.  . Sulfonamide Derivatives Other (See Comments)    Shaking all over, seizure like symptoms. Hospitalization resulted     Review of Systems  Constitutional: Negative for activity change, appetite change, chills, diaphoresis, fatigue and fever.  HENT: Positive for congestion, ear pain, postnasal drip, rhinorrhea and sinus pressure. Negative for sinus pain and sore throat.   Eyes: Negative for redness and visual disturbance.  Respiratory: Positive for cough, shortness of breath and wheezing. Negative for chest tightness.   Cardiovascular: Negative for chest pain, palpitations and leg swelling.  Gastrointestinal: Negative for abdominal distention and abdominal pain.  Musculoskeletal: Negative for arthralgias and gait problem.  Neurological: Negative for dizziness and headaches.  Psychiatric/Behavioral: Negative for dysphoric mood. The patient is not nervous/anxious.     Physical Exam  Constitutional: She appears well-developed and well-nourished.  Smells like cigarettes  HENT:  Head: Normocephalic and atraumatic.  Right Ear: External ear normal.  Left Ear: External ear normal.  Mouth/Throat: Oropharynx is clear and moist.  Nasal membranes swollen and erythematous.  No sinus tenderness.  TMs are both clear.  Eyes: Pupils are equal, round, and reactive to light. Conjunctivae are normal.  Asymmetry of eyes  Neck: Normal range of motion.  Cardiovascular: Normal rate, regular rhythm and normal heart sounds.  Pulmonary/Chest: Effort normal. No respiratory distress. She has wheezes. She has no rales.  Slightly decreased breath sounds.  Central rhonchi, scattered end inspiratory wheezes at bases    Abdominal: Soft. Bowel sounds are normal.  Lymphadenopathy:    She has no cervical adenopathy.  Neurological: She is alert.  Skin: Skin is warm and dry. No rash noted.  Psychiatric: She has a normal mood and affect. Her behavior is normal.    BP (!) 148/70   Pulse (!) 105   Temp 100 F (37.8 C) (Oral)   Resp (!) 22   Ht 5\' 3"  (1.6 m)   Wt 154 lb 1.9 oz (69.9 kg)   SpO2 95%   BMI 27.30 kg/m     ASSESSMENT/PLAN:  1. COPD with exacerbation ([place]) Treat with antibiotic times 5 days prednisone times 5 days cough control and fluids Patient is reminded, again, the importance of smoking cessation.  She does not feel able  Patient Instructions  Take the antibiotic as directed 2 x a day Take the tessalon as needed cough Take the prednisone 2 x a day  Call if not better in a few days  Drink lots of water   Raylene Everts, MD

## 2017-04-23 NOTE — Patient Instructions (Signed)
Take the antibiotic as directed 2 x a day Take the tessalon as needed cough Take the prednisone 2 x a day  Call if not better in a few days  Drink lots of water

## 2017-04-23 NOTE — Telephone Encounter (Signed)
Patient called in stating that [facility] had not received her medication request, however, in the system the pharmacy verified.

## 2017-04-29 ENCOUNTER — Other Ambulatory Visit: Payer: Self-pay | Admitting: Family Medicine

## 2017-05-07 ENCOUNTER — Other Ambulatory Visit: Payer: Self-pay

## 2017-05-07 MED ORDER — POTASSIUM CHLORIDE CRYS ER 20 MEQ PO TBCR: 20.0000 meq | EXTENDED_RELEASE_TABLET | Freq: Every day | ORAL | 5 refills | Status: DC

## 2017-05-20 ENCOUNTER — Encounter (HOSPITAL_COMMUNITY): Payer: Self-pay

## 2017-05-20 ENCOUNTER — Ambulatory Visit (HOSPITAL_COMMUNITY)
Admission: RE | Admit: 2017-05-20 | Discharge: 2017-05-20 | Disposition: A | Discharge status: Home / Self Care | Payer: Medicare Other | Source: Ambulatory Visit | Attending: Gastroenterology | Admitting: Gastroenterology

## 2017-05-20 ENCOUNTER — Other Ambulatory Visit: Payer: Self-pay

## 2017-05-20 ENCOUNTER — Encounter (HOSPITAL_COMMUNITY)
Admission: RE | Disposition: A | Discharge status: Home / Self Care | Payer: Self-pay | Source: Ambulatory Visit | Attending: Gastroenterology

## 2017-05-20 ENCOUNTER — Telehealth: Payer: Self-pay

## 2017-05-20 DIAGNOSIS — D128 Benign neoplasm of rectum: Secondary | ICD-10-CM | POA: Insufficient documentation

## 2017-05-20 DIAGNOSIS — D124 Benign neoplasm of descending colon: Secondary | ICD-10-CM | POA: Insufficient documentation

## 2017-05-20 DIAGNOSIS — M1990 Unspecified osteoarthritis, unspecified site: Secondary | ICD-10-CM | POA: Insufficient documentation

## 2017-05-20 DIAGNOSIS — F17210 Nicotine dependence, cigarettes, uncomplicated: Secondary | ICD-10-CM | POA: Insufficient documentation

## 2017-05-20 DIAGNOSIS — D122 Benign neoplasm of ascending colon: Secondary | ICD-10-CM | POA: Diagnosis not present

## 2017-05-20 DIAGNOSIS — Z8249 Family history of ischemic heart disease and other diseases of the circulatory system: Secondary | ICD-10-CM | POA: Diagnosis not present

## 2017-05-20 DIAGNOSIS — K2970 Gastritis, unspecified, without bleeding: Secondary | ICD-10-CM | POA: Diagnosis not present

## 2017-05-20 DIAGNOSIS — K921 Melena: Secondary | ICD-10-CM | POA: Insufficient documentation

## 2017-05-20 DIAGNOSIS — Z882 Allergy status to sulfonamides status: Secondary | ICD-10-CM | POA: Insufficient documentation

## 2017-05-20 DIAGNOSIS — Z853 Personal history of malignant neoplasm of breast: Secondary | ICD-10-CM | POA: Diagnosis not present

## 2017-05-20 DIAGNOSIS — Z87442 Personal history of urinary calculi: Secondary | ICD-10-CM | POA: Insufficient documentation

## 2017-05-20 DIAGNOSIS — Z923 Personal history of irradiation: Secondary | ICD-10-CM | POA: Diagnosis not present

## 2017-05-20 DIAGNOSIS — Q438 Other specified congenital malformations of intestine: Secondary | ICD-10-CM | POA: Insufficient documentation

## 2017-05-20 DIAGNOSIS — I10 Essential (primary) hypertension: Secondary | ICD-10-CM | POA: Insufficient documentation

## 2017-05-20 DIAGNOSIS — E785 Hyperlipidemia, unspecified: Secondary | ICD-10-CM | POA: Diagnosis not present

## 2017-05-20 DIAGNOSIS — E119 Type 2 diabetes mellitus without complications: Secondary | ICD-10-CM | POA: Diagnosis not present

## 2017-05-20 DIAGNOSIS — Z8719 Personal history of other diseases of the digestive system: Secondary | ICD-10-CM | POA: Diagnosis not present

## 2017-05-20 DIAGNOSIS — Z79899 Other long term (current) drug therapy: Secondary | ICD-10-CM | POA: Diagnosis not present

## 2017-05-20 DIAGNOSIS — J449 Chronic obstructive pulmonary disease, unspecified: Secondary | ICD-10-CM | POA: Diagnosis not present

## 2017-05-20 DIAGNOSIS — Z880 Allergy status to penicillin: Secondary | ICD-10-CM | POA: Diagnosis not present

## 2017-05-20 DIAGNOSIS — K3189 Other diseases of stomach and duodenum: Secondary | ICD-10-CM | POA: Diagnosis not present

## 2017-05-20 DIAGNOSIS — K648 Other hemorrhoids: Secondary | ICD-10-CM | POA: Insufficient documentation

## 2017-05-20 DIAGNOSIS — Z7982 Long term (current) use of aspirin: Secondary | ICD-10-CM | POA: Insufficient documentation

## 2017-05-20 DIAGNOSIS — D123 Benign neoplasm of transverse colon: Secondary | ICD-10-CM | POA: Insufficient documentation

## 2017-05-20 DIAGNOSIS — R195 Other fecal abnormalities: Secondary | ICD-10-CM

## 2017-05-20 HISTORY — PX: ESOPHAGOGASTRODUODENOSCOPY: SHX5428

## 2017-05-20 HISTORY — PX: COLONOSCOPY: SHX5424

## 2017-05-20 SURGERY — COLONOSCOPY
Anesthesia: Moderate Sedation

## 2017-05-20 MED ORDER — STERILE WATER FOR IRRIGATION IR SOLN
Status: DC | PRN
  Administered 2017-05-20: 2.5 mL

## 2017-05-20 MED ORDER — SODIUM CHLORIDE 0.9% FLUSH
INTRAVENOUS | Status: AC
  Filled 2017-05-20: qty 10

## 2017-05-20 MED ORDER — SODIUM CHLORIDE 0.9 % IV SOLN
INTRAVENOUS | Status: DC
  Administered 2017-05-20: 14:00:00 via INTRAVENOUS

## 2017-05-20 MED ORDER — PROMETHAZINE HCL 25 MG/ML IJ SOLN
12.5000 mg | Freq: Once | INTRAMUSCULAR | Status: AC
  Administered 2017-05-20: 12.5 mg via INTRAVENOUS

## 2017-05-20 MED ORDER — MIDAZOLAM HCL 5 MG/5ML IJ SOLN
INTRAMUSCULAR | Status: DC | PRN
  Administered 2017-05-20 (×3): 2 mg via INTRAVENOUS
  Administered 2017-05-20: 1 mg via INTRAVENOUS

## 2017-05-20 MED ORDER — LIDOCAINE VISCOUS 2 % MT SOLN
OROMUCOSAL | Status: DC | PRN
  Administered 2017-05-20: 1 via OROMUCOSAL

## 2017-05-20 MED ORDER — PROMETHAZINE HCL 25 MG/ML IJ SOLN
INTRAMUSCULAR | Status: AC
  Filled 2017-05-20: qty 1

## 2017-05-20 MED ORDER — MIDAZOLAM HCL 5 MG/5ML IJ SOLN
INTRAMUSCULAR | Status: AC
  Filled 2017-05-20: qty 10

## 2017-05-20 MED ORDER — LIDOCAINE VISCOUS 2 % MT SOLN
OROMUCOSAL | Status: DC
Start: 2017-05-20 — End: 2017-05-20
  Filled 2017-05-20: qty 15

## 2017-05-20 MED ORDER — MEPERIDINE HCL 100 MG/ML IJ SOLN
INTRAMUSCULAR | Status: DC | PRN
  Administered 2017-05-20: 50 mg via INTRAVENOUS
  Administered 2017-05-20 (×2): 25 mg via INTRAVENOUS

## 2017-05-20 MED ORDER — MEPERIDINE HCL 100 MG/ML IJ SOLN
INTRAMUSCULAR | Status: DC
Start: 2017-05-20 — End: 2017-05-20
  Filled 2017-05-20: qty 2

## 2017-05-20 NOTE — H&P (Signed)
Primary Care Physician:  [name], MD Primary Gastroenterologist:  Dr. Oneida Alar  Pre-Procedure History & Physical: HPI:  [name] is a 72 y.o. female here for HEME POS STOOLS/melena.  Past Medical History:  Diagnosis Date  . Breast cancer ([place]) 2007   Stage I (T1b N0 M0), grade 1 well-differentiated carcinoma of the left breast status, post lumpectomy followed by radiation therapy. Her estrogen receptor receptors were 93%, progesterone receptors 67%. HER-2/neu was negative. No lymphovascular space invasion was seen. All margins were clear. Ki-67 marker was low at 1% with surgery on 11/15/2004. Treated then with post-lumpectomy radiation, finish  . Breast cancer, left breast ([place]) 2007  . COPD (chronic obstructive pulmonary disease) ([place])   . Diabetes mellitus without complication ([place])   . Diverticula of colon   . Hyperlipidemia   . Hypertension   . Kidney stones   . Nicotine dependence   . Osteoarthritis     Past Surgical History:  Procedure Laterality Date  . ABDOMINAL HYSTERECTOMY    . BREAST SURGERY  2005 approx   left lumpectomy   . CHOLECYSTECTOMY N/A 05/26/2014   Procedure: LAPAROSCOPIC CHOLECYSTECTOMY;  Surgeon: Aviva Signs Md, MD;  Location: [facility];  Service: General;  Laterality: N/A;  . COLECTOMY     2005, diverticulitis  . DILATION AND CURETTAGE OF UTERUS    . left breast      cancer, in 2006  . TUBAL LIGATION      Prior to Admission medications   Medication Sig Start Date End Date Taking? Authorizing Provider  acetaminophen (TYLENOL) 500 MG tablet Take 500 mg by mouth every 6 (six) hours as needed for moderate pain.    Yes [provider]  albuterol (PROVENTIL) (2.5 MG/3ML) 0.083% nebulizer solution Take 2.5 mg by nebulization 3 (three) times daily as needed for wheezing or shortness of breath.   Yes [provider]  amLODipine (NORVASC) 5 MG tablet Take 1 tablet (5 mg total) by mouth daily. 01/23/17  Yes [name], MD   aspirin EC 81 MG tablet Take 81 mg by mouth daily.   Yes [provider]  ATROVENT HFA 17 MCG/ACT inhaler INHALE 2 PUFFS BY MOUTH FOUR TIMES A DAY. 04/01/17  Yes [name], MD  azelastine (ASTELIN) 0.1 % nasal spray PLACE 2 SPRAYS INTO EACH NOSTRIL TWICE DAILY. 03/14/16  Yes [name], MD  benazepril (LOTENSIN) 40 MG tablet TAKE ONE TABLET BY MOUTH DAILY. Patient taking differently: TAKE 40 mg BY MOUTH DAILY. 11/15/16  Yes [name], MD  Calcium Carbonate-Vitamin D (CALCIUM 600 + D PO) Take 1 tablet by mouth 2 (two) times daily. 600/400   Yes [provider]  fluticasone (FLONASE) 50 MCG/ACT nasal spray Place 2 sprays into both nostrils daily.   Yes [provider]  ibuprofen (ADVIL,MOTRIN) 200 MG tablet Take 400 mg by mouth every 6 (six) hours as needed for mild pain or moderate pain.   Yes [provider]  loratadine (CLARITIN) 10 MG tablet TAKE 1 TABLET BY MOUTH ONCE DAILY FOR ALLERGIES. Patient taking differently: Take 10 mg by mouth daily.  11/28/16  Yes [name], MD  montelukast (SINGULAIR) 10 MG tablet TAKE 1 TABLET BY MOUTH AT BEDTIME. Patient taking differently: TAke 10 mg BY MOUTH AT BEDTIME. 04/01/17  Yes [name], MD  Omega-3 Fatty Acids (FISH OIL) 1000 MG CAPS Take 1 capsule by mouth 2 (two) times daily.    Yes [provider]  polyethylene glycol powder (GLYCOLAX/MIRALAX) powder MIX 1 CAPFUL IN 8 OUNCES OF JUICE OR WATER AND DRINK ONCE DAILY. 04/23/16  Yes [name], MD  potassium chloride SA (K-DUR,KLOR-CON) 20 MEQ tablet Take 1 tablet (20 mEq total) by mouth daily. 05/07/17  Yes [name], MD  simvastatin (ZOCOR) 20 MG tablet TAKE (1) TABLET BY MOUTH DAILY AT 6 PM FOR CHOLESTEROL. Patient taking differently: TAKE 20 MG BY MOUTH DAILY AT 6 PM FOR CHOLESTEROL. 12/27/16  Yes [name], MD  SYMBICORT 160-4.5 MCG/ACT inhaler INHALE 2 PUFFS INTO THE LUNGS TWICE DAILY.  04/01/17  Yes [name], MD  tiZANidine (ZANAFLEX) 4 MG tablet TAKE ONE TABLET EVERY 8 HOURS AS NEEDED FOR MUSCLE SPASMS. Patient taking differently: TAKE 4 MG EVERY 8 HOURS AS NEEDED FOR MUSCLE SPASMS. 02/27/17  Yes [name], MD  traMADol (ULTRAM) 50 MG tablet TAKE (1) TABLET BY MOUTH EVERY SIX HOURS AS NEEDED. 05/13/17  Yes Carole Civil, MD  azithromycin (ZITHROMAX Z-PAK) 250 MG tablet Take 2 tabs po on day 1, then 1 tab po daily x 4 days. Patient not taking: Reported on 04/23/2017 04/10/17   Holley Bouche, NP  benzonatate (TESSALON) 100 MG capsule Take 1 capsule (100 mg total) by mouth 2 (two) times daily as needed for cough. Patient not taking: Reported on 05/13/2017 04/23/17   Raylene Everts, MD  doxycycline (VIBRA-TABS) 100 MG tablet Take 1 tablet (100 mg total) by mouth 2 (two) times daily. Patient not taking: Reported on 05/13/2017 04/23/17   Raylene Everts, MD  predniSONE (DELTASONE) 20 MG tablet Take 1 tablet (20 mg total) by mouth 2 (two) times daily with a meal. Patient not taking: Reported on 05/13/2017 04/23/17   Raylene Everts, MD  tiZANidine (ZANAFLEX) 4 MG tablet TAKE ONE TABLET EVERY 8 HOURS AS NEEDED FOR MUSCLE SPASMS. Patient not taking: Reported on 05/13/2017 04/29/17   [name], MD    Allergies as of 04/04/2017 - Review Complete 04/04/2017  Allergen Reaction Noted  . Penicillins Hives, Shortness Of Breath, and Swelling 09/29/2008  . Sulfonamide derivatives Other (See Comments) 09/29/2008    Family History  Problem Relation Age of Onset  . Breast cancer Mother   . COPD Father   . Emphysema Father   . Throat cancer Sister   . Glaucoma Brother   . Schizophrenia Brother   . Heart attack Brother   . Lung cancer Sister        former smoker  . Hepatitis C Daughter   . Seizures Daughter   . SIDS Son   . Colon cancer Neg Hx   . Colon polyps Neg Hx     Social History   Socioeconomic History  . Marital status: Single     Spouse name: Not on file  . Number of children: Not on file  . Years of education: Not on file  . Highest education level: Not on file  Occupational History  . Occupation: retired    Fish farm manager: RETIRED  Social Needs  . Financial resource strain: Not on file  . Food insecurity:    Worry: Not on file    Inability: Not on file  . Transportation needs:    Medical: Not on file    Non-medical: Not on file  Tobacco Use  . Smoking status: Current Every Day Smoker    Packs/day: 1.00    Years: 50.00    Pack years: 50.00    Types: Cigarettes  .  Smokeless tobacco: Never Used  . Tobacco comment: cigaretes  now 10 to 15 per day  Substance and Sexual Activity  . Alcohol use: No    Alcohol/week: 0.0 oz  . Drug use: No  . Sexual activity: Never  Lifestyle  . Physical activity:    Days per week: Not on file    Minutes per session: Not on file  . Stress: Not on file  Relationships  . Social connections:    Talks on phone: Not on file    Gets together: Not on file    Attends religious service: Not on file    Active member of club or organization: Not on file    Attends meetings of clubs or organizations: Not on file    Relationship status: Not on file  . Intimate partner violence:    Fear of current or ex partner: Not on file    Emotionally abused: Not on file    Physically abused: Not on file    Forced sexual activity: Not on file  Other Topics Concern  . 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 QUIT WORKING 2003 AFTER CANCER DIAGNOSIS.      MARRIED WITH A RUN AWAY HUSBAND(FUGITIVE 10 YRS). 3 MISCARRIAGES, 5 CHILDREN    Review of Systems: See HPI, otherwise negative ROS   Physical Exam: BP (!) 149/68   Pulse 91   Temp 99.2 F (37.3 C) (Oral)   Resp (!) 23   Ht '5\' 4"'  (1.626 m)   Wt 167 lb (75.8 kg)   SpO2 96%   BMI 28.67 kg/m  General:   Alert,  pleasant and cooperative in NAD Head:  Normocephalic and  atraumatic. Neck:  Supple; Lungs:  Clear throughout to auscultation.    Heart:  Regular rate and rhythm. Abdomen:  Soft, nontender and nondistended. Normal bowel sounds, without guarding, and without rebound.   Neurologic:  Alert and  oriented x4;  grossly normal neurologically.  Impression/Plan:     HEME POS STOOLS/melena  PLAN:  1.TCS/ EGD TODAY. DISCUSSED PROCEDURE, BENEFITS, & RISKS: < 1% chance of medication reaction, bleeding, perforation, or rupture of spleen/liver.

## 2017-05-20 NOTE — Op Note (Signed)
[facility] Patient Name: [name] Procedure Date: 05/20/2017 2:47 PM MRN: [id number] Date of Birth: 12/04/1945 Attending MD: Barney Drain MD, MD CSN: [id number] Age: 72 Admit Type: Outpatient Procedure:                Upper GI endoscopy WITH COLD FORCEPS BIOPSY Indications:              Melena ON ASA WITH NL Hb FEB 2019. Providers:                Barney Drain MD, MD, Lurline Del, RN, Nelma Rothman,                            Technician Referring MD:             Norwood Levo. Simpson MD, MD Medicines:                TCS + Meperidine 25 mg IV, Midazolam 2 mg IV Complications:            No immediate complications. Estimated Blood Loss:     Estimated blood loss was minimal. Procedure:                Pre-Anesthesia Assessment:                           - Prior to the procedure, a History and Physical                            was performed, and patient medications and                            allergies were reviewed. The patient's tolerance of                            previous anesthesia was also reviewed. The risks                            and benefits of the procedure and the sedation                            options and risks were discussed with the patient.                            All questions were answered, and informed consent                            was obtained. Prior Anticoagulants: The patient has                            taken aspirin. ASA Grade Assessment: II - A patient                            with mild systemic disease. After reviewing the                            risks and benefits, the patient was deemed in  satisfactory condition to undergo the procedure.                            After obtaining informed consent, the endoscope was                            passed under direct vision. Throughout the                            procedure, the patient's blood pressure, pulse, and                            oxygen  saturations were monitored continuously. The                            [id number] ([id number]) scope was introduced through the                            mouth, and advanced to the second part of duodenum.                            The upper GI endoscopy was accomplished without                            difficulty. The patient tolerated the procedure                            well. Scope In: 2:57:10 PM Scope Out: 3:02:25 PM Total Procedure Duration: 0 hours 5 minutes 15 seconds  Findings:      The examined esophagus was normal.      Localized mild inflammation characterized by congestion (edema) and       erythema was found in the gastric body and in the gastric antrum.      The examined duodenum was normal. Biopsies were taken with a cold       forceps for histology. Impression:               - Normal esophagus.                           - MILD Gastritis DUE TO ASA USE. Moderate Sedation:      Moderate (conscious) sedation was administered by the endoscopy nurse       and supervised by the endoscopist. The following parameters were       monitored: oxygen saturation, heart rate, blood pressure, and response       to care. Total physician intraservice time was 49 minutes. Recommendation:           - High fiber diet and low fat diet.                           - Continue present medications. HOLD ASA UNLESS                            MEDICLLY NECESSARY                           -  Await pathology results.                           - Return to my office in 4 months.                           - Patient has a contact number available for                            emergencies. The signs and symptoms of potential                            delayed complications were discussed with the                            patient. Return to normal activities tomorrow.                            Written discharge instructions were provided to the                            patient. Procedure Code(s):         --- Professional ---                           [phone], Esophagogastroduodenoscopy, flexible,                            transoral; with biopsy, single or multiple                           G0500, Moderate sedation services provided by the                            same physician or other qualified health care                            professional performing a gastrointestinal                            endoscopic service that sedation supports,                            requiring the presence of an independent trained                            observer to assist in the monitoring of the                            patient's level of consciousness and physiological                            status; initial 15 minutes of intra-service time;  patient age 18 years or older (additional time may                            be reported with [phone], as appropriate)                           [phone], Moderate sedation services provided by the                            same physician or other qualified health care                            professional performing the diagnostic or                            therapeutic service that the sedation supports,                            requiring the presence of an independent trained                            observer to assist in the monitoring of the                            patient's level of consciousness and physiological                            status; each additional 15 minutes intraservice                            time (List separately in addition to code for                            primary service)                           [phone], Moderate sedation services provided by the                            same physician or other qualified health care                            professional performing the diagnostic or                            therapeutic service that the sedation supports,                             requiring the presence of an independent trained                            observer to assist in the monitoring of the  patient's level of consciousness and physiological                            status; each additional 15 minutes intraservice                            time (List separately in addition to code for                            primary service) Diagnosis Code(s):        --- Professional ---                           K29.70, Gastritis, unspecified, without bleeding                           K92.1, Melena (includes Hematochezia) CPT copyright 2017 [employer]. All rights reserved. The codes documented in this report are preliminary and upon coder review may  be revised to meet current compliance requirements. Barney Drain, MD Barney Drain MD, MD 05/20/2017 3:30:43 PM This report has been signed electronically. Number of Addenda: 0

## 2017-05-20 NOTE — Op Note (Signed)
[facility] Patient Name: [name] Procedure Date: 05/20/2017 2:04 PM MRN: [id number] Date of Birth: 1945/05/01 Attending MD: Barney Drain MD, MD CSN: [id number] Age: 72 Admit Type: Outpatient Procedure:                Colonoscopy WITH COLD SNARE POLYPECTOMY Indications:              Heme positive stool, Melena Providers:                Barney Drain MD, MD, Lurline Del, RN, Nelma Rothman,                            Technician Referring MD:             Norwood Levo. Simpson MD, MD Medicines:                Promethazine 12.5 mg IV, Meperidine 75 mg IV,                            Midazolam 5 mg IV Complications:            No immediate complications. Estimated Blood Loss:     Estimated blood loss was minimal. Procedure:                Pre-Anesthesia Assessment:                           - Prior to the procedure, a History and Physical                            was performed, and patient medications and                            allergies were reviewed. The patient's tolerance of                            previous anesthesia was also reviewed. The risks                            and benefits of the procedure and the sedation                            options and risks were discussed with the patient.                            All questions were answered, and informed consent                            was obtained. Prior Anticoagulants: The patient has                            taken aspirin. ASA Grade Assessment: II - A patient                            with mild systemic disease. After reviewing the  risks and benefits, the patient was deemed in                            satisfactory condition to undergo the procedure.                            After obtaining informed consent, the colonoscope                            was passed under direct vision. Throughout the                            procedure, the patient's blood pressure, pulse, and                         oxygen saturations were monitored continuously. The                            EC-[id number] ([id number]) scope was introduced through                            the anus and advanced to the 7 cm into the ileum.                            The colonoscopy was somewhat difficult due to a                            tortuous colon. Successful completion of the                            procedure was aided by straightening and shortening                            the scope to obtain bowel loop reduction and                            COLOWRAP. The patient tolerated the procedure                            fairly well. The quality of the bowel preparation                            was excellent. The terminal ileum, ileocecal valve,                            appendiceal orifice, and rectum were photographed. Scope In: 2:29:11 PM Scope Out: 2:45:38 PM Scope Withdrawal Time: 0 hours 15 minutes 20 seconds  Total Procedure Duration: 0 hours 16 minutes 27 seconds  Findings:      The terminal ileum appeared normal.      Five sessile polyps were found in the rectum, descending colon, splenic       flexure and ascending colon. The polyps were 3 to 6 mm in size. These       polyps were removed with  a cold snare. Resection and retrieval were       complete.      The recto-sigmoid colon and sigmoid colon were mildly tortuous.      Internal hemorrhoids were found during retroflexion. The hemorrhoids       were small. Impression:               - The examined portion of the ileum was normal.                           - Five 3 to 6 mm polyps in the rectum(2), in the                            descending colon, at the splenic flexure and in the                            ascending colon, removed with a cold snare.                            Resected and retrieved.                           - Tortuous LEFT colon.                           - Internal hemorrhoids. Moderate Sedation:       Moderate (conscious) sedation was administered by the endoscopy nurse       and supervised by the endoscopist. The following parameters were       monitored: oxygen saturation, heart rate, blood pressure, and response       to care. Total physician intraservice time was 47 minutes. Recommendation:           - Repeat colonoscopy in 3 years for surveillance.                           - High fiber diet.                           - Continue present medications.                           - Await pathology results.                           - Patient has a contact number available for                            emergencies. The signs and symptoms of potential                            delayed complications were discussed with the                            patient. Return to normal activities tomorrow.  Written discharge instructions were provided to the                            patient. Procedure Code(s):        --- Professional ---                           [phone], Colonoscopy, flexible; with removal of                            tumor(s), polyp(s), or other lesion(s) by snare                            technique                           G0500, Moderate sedation services provided by the                            same physician or other qualified health care                            professional performing a gastrointestinal                            endoscopic service that sedation supports,                            requiring the presence of an independent trained                            observer to assist in the monitoring of the                            patient's level of consciousness and physiological                            status; initial 15 minutes of intra-service time;                            patient age 43 years or older (additional time may                            be reported with [phone], as appropriate)                            [phone], Moderate sedation services provided by the                            same physician or other qualified health care                            professional performing the diagnostic or  therapeutic service that the sedation supports,                            requiring the presence of an independent trained                            observer to assist in the monitoring of the                            patient's level of consciousness and physiological                            status; each additional 15 minutes intraservice                            time (List separately in addition to code for                            primary service)                           [phone], Moderate sedation services provided by the                            same physician or other qualified health care                            professional performing the diagnostic or                            therapeutic service that the sedation supports,                            requiring the presence of an independent trained                            observer to assist in the monitoring of the                            patient's level of consciousness and physiological                            status; each additional 15 minutes intraservice                            time (List separately in addition to code for                            primary service) Diagnosis Code(s):        --- Professional ---                           B04.8, Other hemorrhoids  K62.1, Rectal polyp                           D12.4, Benign neoplasm of descending colon                           D12.3, Benign neoplasm of transverse colon (hepatic                            flexure or splenic flexure)                           D12.2, Benign neoplasm of ascending colon                           R19.5, Other fecal abnormalities                           K92.1, Melena (includes  Hematochezia)                           Q43.8, Other specified congenital malformations of                            intestine CPT copyright 2017 [employer]. All rights reserved. The codes documented in this report are preliminary and upon coder review may  be revised to meet current compliance requirements. Barney Drain, MD Barney Drain MD, MD 05/20/2017 3:27:01 PM This report has been signed electronically. Number of Addenda: 0

## 2017-05-20 NOTE — Telephone Encounter (Signed)
Received a call from [facility] from Endo, PT will need a 4 month recheck per Dr. Oneida Alar and repeat TCS in 3 years

## 2017-05-20 NOTE — Discharge Instructions (Signed)
YOUR HEME POSITIVE STOOLS ARE DUE TO INTERNAL HEMORRHOIDS, GASTRITIS, AND polyps. YOU have five polyps REMOVED. I biopsied your stomach.   DRINK WATER TO KEEP YOUR URINE LIGHT YELLOW.  FOLLOW A HIGH FIBER/LOW FAT DIET. AVOID ITEMS THAT CAUSE BLOATING. SEE INFO BELOW.  YOUR BIOPSY RESULTS WILL BE AVAILABLE IN 7 DAYS.   FOLLOW UP IN 4 MOS.  Next colonoscopy IN 3 YEARS.   ENDOSCOPY Care After Read the instructions outlined below and refer to this sheet in the next week. These discharge instructions provide you with general information on caring for yourself after you leave the hospital. While your treatment has been planned according to the most current medical practices available, unavoidable complications occasionally occur. If you have any problems or questions after discharge, call DR. FIELDS, [phone].  ACTIVITY  You may resume your regular activity, but move at a slower pace for the next 24 hours.   Take frequent rest periods for the next 24 hours.   Walking will help get rid of the air and reduce the bloated feeling in your belly (abdomen).   No driving for 24 hours (because of the medicine (anesthesia) used during the test).   You may shower.   Do not sign any important legal documents or operate any machinery for 24 hours (because of the anesthesia used during the test).    NUTRITION  Drink plenty of fluids.   You may resume your normal diet as instructed by your doctor.   Begin with a light meal and progress to your normal diet. Heavy or fried foods are harder to digest and may make you feel sick to your stomach (nauseated).   Avoid alcoholic beverages for 24 hours or as instructed.    MEDICATIONS  You may resume your normal medications.   WHAT YOU CAN EXPECT TODAY  Some feelings of bloating in the abdomen.   Passage of more gas than usual.   Spotting of blood in your stool or on the toilet paper  .  IF YOU HAD POLYPS REMOVED DURING THE  ENDOSCOPY:  Eat a soft diet IF YOU HAVE NAUSEA, BLOATING, ABDOMINAL PAIN, OR VOMITING.    FINDING OUT THE RESULTS OF YOUR TEST Not all test results are available during your visit. DR. Oneida Alar WILL CALL YOU WITHIN 14 DAYS OF YOUR PROCEDUE WITH YOUR RESULTS. Do not assume everything is normal if you have not heard from DR. FIELDS, CALL HER OFFICE AT [phone].  SEEK IMMEDIATE MEDICAL ATTENTION AND CALL THE OFFICE: [phone] IF:  You have more than a spotting of blood in your stool.   Your belly is swollen (abdominal distention).   You are nauseated or vomiting.   You have a temperature over 101F.   You have abdominal pain or discomfort that is severe or gets worse throughout the day.    Gastritis/DUODENITIS  Gastritis is an inflammation (the body's way of reacting to injury and/or infection) of the stomach. DUODENITIS is an inflammation (the body's way of reacting to injury and/or infection) of the FIRST PART OF THE SMALL INTESTINES. It is often caused by bacterial (germ) infections. It can also be caused BY ASPIRIN, BC/GOODY POWDER'S, (IBUPROFEN) MOTRIN, OR ALEVE (NAPROXEN), chemicals (including alcohol), SPICY FOODS, and medications. This illness may be associated with generalized malaise (feeling tired, not well), UPPER ABDOMINAL STOMACH cramps, and fever. One common bacterial cause of gastritis is an organism known as H. Pylori. This can be treated with antibiotics.    High-Fiber Diet A high-fiber diet changes  your normal diet to include more whole grains, legumes, fruits, and vegetables. Changes in the diet involve replacing refined carbohydrates with unrefined foods. The calorie level of the diet is essentially unchanged. The Dietary Reference Intake (recommended amount) for adult males is 38 grams per day. For adult females, it is 25 grams per day. Pregnant and lactating women should consume 28 grams of fiber per day. Fiber is the intact part of a plant that is not broken down  during digestion. Functional fiber is fiber that has been isolated from the plant to provide a beneficial effect in the body. PURPOSE  Increase stool bulk.   Ease and regulate bowel movements.   Lower cholesterol.  REDUCE RISK OF COLON CANCER  INDICATIONS THAT YOU NEED MORE FIBER  Constipation and hemorrhoids.   Uncomplicated diverticulosis (intestine condition) and irritable bowel syndrome.   Weight management.   As a protective measure against hardening of the arteries (atherosclerosis), diabetes, and cancer.   GUIDELINES FOR INCREASING FIBER IN THE DIET  Start adding fiber to the diet slowly. A gradual increase of about 5 more grams (2 slices of whole-wheat bread, 2 servings of most fruits or vegetables, or 1 bowl of high-fiber cereal) per day is best. Too rapid an increase in fiber may result in constipation, flatulence, and bloating.   Drink enough water and fluids to keep your urine clear or pale yellow. Water, juice, or caffeine-free drinks are recommended. Not drinking enough fluid may cause constipation.   Eat a variety of high-fiber foods rather than one type of fiber.   Try to increase your intake of fiber through using high-fiber foods rather than fiber pills or supplements that contain small amounts of fiber.   The goal is to change the types of food eaten. Do not supplement your present diet with high-fiber foods, but replace foods in your present diet.   INCLUDE A VARIETY OF FIBER SOURCES  Replace refined and processed grains with whole grains, canned fruits with fresh fruits, and incorporate other fiber sources. White rice, white breads, and most bakery goods contain little or no fiber.   Brown whole-grain rice, buckwheat oats, and many fruits and vegetables are all good sources of fiber. These include: broccoli, Brussels sprouts, cabbage, cauliflower, beets, sweet potatoes, white potatoes (skin on), carrots, tomatoes, eggplant, squash, berries, fresh fruits, and  dried fruits.   Cereals appear to be the richest source of fiber. Cereal fiber is found in whole grains and bran. Bran is the fiber-rich outer coat of cereal grain, which is largely removed in refining. In whole-grain cereals, the bran remains. In breakfast cereals, the largest amount of fiber is found in those with "bran" in their names. The fiber content is sometimes indicated on the label.   You may need to include additional fruits and vegetables each day.   In baking, for 1 cup white flour, you may use the following substitutions:   1 cup whole-wheat flour minus 2 tablespoons.   1/2 cup white flour plus 1/2 cup whole-wheat flour.    Low-Fat Diet BREADS, CEREALS, PASTA, RICE, DRIED PEAS, AND BEANS These products are high in carbohydrates and most are low in fat. Therefore, they can be increased in the diet as substitutes for fatty foods. They too, however, contain calories and should not be eaten in excess. Cereals can be eaten for snacks as well as for breakfast.  Include foods that contain fiber (fruits, vegetables, whole grains, and legumes). Research shows that fiber may lower blood cholesterol levels,  especially the water-soluble fiber found in fruits, vegetables, oat products, and legumes. FRUITS AND VEGETABLES It is good to eat fruits and vegetables. Besides being sources of fiber, both are rich in vitamins and some minerals. They help you get the daily allowances of these nutrients. Fruits and vegetables can be used for snacks and desserts. MEATS Limit lean meat, chicken, [place], and fish to no more than 6 ounces per day. Beef, Pork, and Lamb Use lean cuts of beef, pork, and lamb. Lean cuts include:  Extra-lean ground beef.  Arm roast.  Sirloin tip.  Center-cut ham.  Round steak.  Loin chops.  Rump roast.  Tenderloin.  Trim all fat off the outside of meats before cooking. It is not necessary to severely decrease the intake of red meat, but lean choices should be made. Lean  meat is rich in protein and contains a highly absorbable form of iron. Premenopausal women, in particular, should avoid reducing lean red meat because this could increase the risk for low red blood cells (iron-deficiency anemia). The organ meats, such as liver, sweetbreads, kidneys, and brain are very rich in cholesterol. They should be limited. Chicken and [place] These are good sources of protein. The fat of poultry can be reduced by removing the skin and underlying fat layers before cooking. Chicken and [place] can be substituted for lean red meat in the diet. Poultry should not be fried or covered with high-fat sauces. Fish and Shellfish Fish is a good source of protein. Shellfish contain cholesterol, but they usually are low in saturated fatty acids. The preparation of fish is important. Like chicken and [place], they should not be fried or covered with high-fat sauces. EGGS Egg whites contain no fat or cholesterol. They can be eaten often. Try 1 to 2 egg whites instead of whole eggs in recipes or use egg substitutes that do not contain yolk. MILK AND DAIRY PRODUCTS Use skim or 1% milk instead of 2% or whole milk. Decrease whole milk, natural, and processed cheeses. Use nonfat or low-fat (2%) cottage cheese or low-fat cheeses made from vegetable oils. Choose nonfat or low-fat (1 to 2%) yogurt. Experiment with evaporated skim milk in recipes that call for heavy cream. Substitute low-fat yogurt or low-fat cottage cheese for sour cream in dips and salad dressings. Have at least 2 servings of low-fat dairy products, such as 2 glasses of skim (or 1%) milk each day to help get your daily calcium intake.  FATS AND OILS Reduce the total intake of fats, especially saturated fat. Butterfat, lard, and beef fats are high in saturated fat and cholesterol. These should be avoided as much as possible. Vegetable fats do not contain cholesterol, but certain vegetable fats, such as coconut oil, palm oil, and palm kernel  oil are very high in saturated fats. These should be limited. These fats are often used in bakery goods, processed foods, popcorn, oils, and nondairy creamers. Vegetable shortenings and some peanut butters contain hydrogenated oils, which are also saturated fats. Read the labels on these foods and check for saturated vegetable oils. Unsaturated vegetable oils and fats do not raise blood cholesterol. However, they should be limited because they are fats and are high in calories. Total fat should still be limited to 30% of your daily caloric intake. Desirable liquid vegetable oils are corn oil, cottonseed oil, olive oil, canola oil, safflower oil, soybean oil, and sunflower oil. Peanut oil is not as good, but small amounts are acceptable. Buy a heart-healthy tub margarine that has  no partially hydrogenated oils in the ingredients. Mayonnaise and salad dressings often are made from unsaturated fats, but they should also be limited because of their high calorie and fat content. Seeds, nuts, peanut butter, olives, and avocados are high in fat, but the fat is mainly the unsaturated type. These foods should be limited mainly to avoid excess calories and fat. OTHER EATING TIPS Snacks  Most sweets should be limited as snacks. They tend to be rich in calories and fats, and their caloric content outweighs their nutritional value. Some good choices in snacks are graham crackers, melba toast, soda crackers, bagels (no egg), English muffins, fruits, and vegetables. These snacks are preferable to snack crackers, [place] fries, and chips. Popcorn should be air-popped or cooked in small amounts of liquid vegetable oil. Desserts Eat fruit, low-fat yogurt, and fruit ices. AVOID pastries, cake, and cookies. Sherbet, angel food cake, gelatin dessert, frozen low-fat yogurt, or other frozen products that do not contain saturated fat (pure fruit juice bars, frozen ice pops) are also acceptable.  COOKING METHODS Choose those methods  that use little or no fat. They include: Poaching.  Braising.  Steaming.  Grilling.  Baking.  Stir-frying.  Broiling.  Microwaving.  Foods can be cooked in a nonstick pan without added fat, or use a nonfat cooking spray in regular cookware. Limit fried foods and avoid frying in saturated fat. Add moisture to lean meats by using water, broth, cooking wines, and other nonfat or low-fat sauces along with the cooking methods mentioned above. Soups and stews should be chilled after cooking. The fat that forms on top after a few hours in the refrigerator should be skimmed off. When preparing meals, avoid using excess salt. Salt can contribute to raising blood pressure in some people. EATING AWAY FROM HOME Order entres, potatoes, and vegetables without sauces or butter. When meat exceeds the size of a deck of cards (3 to 4 ounces), the rest can be taken home for another meal. Choose vegetable or fruit salads and ask for low-calorie salad dressings to be served on the side. Use dressings sparingly. Limit high-fat toppings, such as bacon, crumbled eggs, cheese, sunflower seeds, and olives. Ask for heart-healthy tub margarine instead of butter.  Hemorrhoids Hemorrhoids are dilated (enlarged) veins around the rectum. Sometimes clots will form in the veins. This makes them swollen and painful. These are called thrombosed hemorrhoids. Causes of hemorrhoids include:  Constipation.   Straining to have a bowel movement.   HEAVY LIFTING  HOME CARE INSTRUCTIONS  Eat a well balanced diet and drink 6 to 8 glasses of water every day to avoid constipation. You may also use a bulk laxative.   Avoid straining to have bowel movements.   Keep anal area dry and clean.   Do not use a donut shaped pillow or sit on the toilet for long periods. This increases blood pooling and pain.   Move your bowels when your body has the urge; this will require less straining and will decrease pain and pressure.

## 2017-05-21 ENCOUNTER — Other Ambulatory Visit: Payer: Self-pay | Admitting: Family Medicine

## 2017-05-21 NOTE — Telephone Encounter (Signed)
ON RECALL  °

## 2017-05-23 ENCOUNTER — Telehealth: Payer: Self-pay | Admitting: Gastroenterology

## 2017-05-23 NOTE — Telephone Encounter (Signed)
PT is aware.

## 2017-05-23 NOTE — Telephone Encounter (Signed)
Please call pt. SHE had FOUR simple adenomaS AND ONE HYPERPLASTIC POLYP removed. HOLD ASA UNLESS MEDICALLY NECESSARY.     DRINK WATER TO KEEP YOUR URINE LIGHT YELLOW. FOLLOW A HIGH FIBER/LOW FAT DIET. AVOID ITEMS THAT CAUSE BLOATING.  FOLLOW UP IN 4 MOS E30 MELENA.  Next colonoscopy IN 3 YEARS.

## 2017-05-24 ENCOUNTER — Encounter (HOSPITAL_COMMUNITY): Payer: Self-pay | Admitting: Gastroenterology

## 2017-05-24 NOTE — Telephone Encounter (Signed)
ON RECALL AND SCHEDULED  °

## 2017-05-28 ENCOUNTER — Ambulatory Visit (INDEPENDENT_AMBULATORY_CARE_PROVIDER_SITE_OTHER): Payer: Medicare Other | Admitting: Orthopaedic Surgery

## 2017-05-28 ENCOUNTER — Other Ambulatory Visit: Payer: Self-pay | Admitting: Family Medicine

## 2017-05-28 ENCOUNTER — Encounter: Payer: Self-pay | Admitting: Orthopaedic Surgery

## 2017-05-28 VITALS — BP 140/90 | HR 98 | Temp 98.2°F | Ht 64.0 in | Wt 160.0 lb

## 2017-05-28 DIAGNOSIS — M7061 Trochanteric bursitis, right hip: Secondary | ICD-10-CM

## 2017-05-28 DIAGNOSIS — F17210 Nicotine dependence, cigarettes, uncomplicated: Secondary | ICD-10-CM | POA: Diagnosis not present

## 2017-05-28 NOTE — Patient Instructions (Signed)
Steps to Quit Smoking Smoking tobacco can be bad for your health. It can also affect almost every organ in your body. Smoking puts you and people around you at risk for many serious long-lasting (chronic) diseases. Quitting smoking is hard, but it is one of the best things that you can do for your health. It is never too late to quit. What are the benefits of quitting smoking? When you quit smoking, you lower your risk for getting serious diseases and conditions. They can include:  Lung cancer or lung disease.  Heart disease.  Stroke.  Heart attack.  Not being able to have children (infertility).  Weak bones (osteoporosis) and broken bones (fractures).  If you have coughing, wheezing, and shortness of breath, those symptoms may get better when you quit. You may also get sick less often. If you are pregnant, quitting smoking can help to lower your chances of having a baby of low birth weight. What can I do to help me quit smoking? Talk with your doctor about what can help you quit smoking. Some things you can do (strategies) include:  Quitting smoking totally, instead of slowly cutting back how much you smoke over a period of time.  Going to in-person counseling. You are more likely to quit if you go to many counseling sessions.  Using resources and support systems, such as: ? Online chats with a counselor. ? Phone quitlines. ? Printed self-help materials. ? Support groups or group counseling. ? Text messaging programs. ? Mobile phone apps or applications.  Taking medicines. Some of these medicines may have nicotine in them. If you are pregnant or breastfeeding, do not take any medicines to quit smoking unless your doctor says it is okay. Talk with your doctor about counseling or other things that can help you.  Talk with your doctor about using more than one strategy at the same time, such as taking medicines while you are also going to in-person counseling. This can help make  quitting easier. What things can I do to make it easier to quit? Quitting smoking might feel very hard at first, but there is a lot that you can do to make it easier. Take these steps:  Talk to your family and friends. Ask them to support and encourage you.  Call phone quitlines, reach out to support groups, or work with a counselor.  Ask people who smoke to not smoke around you.  Avoid places that make you want (trigger) to smoke, such as: ? Bars. ? Parties. ? Smoke-break areas at work.  Spend time with people who do not smoke.  Lower the stress in your life. Stress can make you want to smoke. Try these things to help your stress: ? Getting regular exercise. ? Deep-breathing exercises. ? Yoga. ? Meditating. ? Doing a body scan. To do this, close your eyes, focus on one area of your body at a time from head to toe, and notice which parts of your body are tense. Try to relax the muscles in those areas.  Download or buy apps on your mobile phone or tablet that can help you stick to your quit plan. There are many free apps, such as QuitGuide from the CDC (Centers for Disease Control and Prevention). You can find more support from smokefree.gov and other websites.  This information is not intended to replace advice given to you by your health care provider. Make sure you discuss any questions you have with your health care provider. Document Released: 10/28/2008 Document   Revised: 08/30/2015 Document Reviewed: 05/18/2014 Elsevier Interactive Patient Education  2018 Elsevier Inc.  

## 2017-05-28 NOTE — Progress Notes (Signed)
Patient TU:[id number] [name], female DOB:08/03/1945, 72 y.o. KJZ:[id number]  Chief Complaint  Patient presents with  . Hip Pain    right     HPI  [name] is a 72 y.o. female who has hip pain on the right.  She is better.  The pain medicine helps a lot.  She has no new trauma.   HPI  Body mass index is 27.46 kg/m.  ROS  Review of Systems  HENT: Negative for congestion.   Respiratory: Positive for shortness of breath. Negative for cough.   Cardiovascular: Negative for chest pain and leg swelling.  Endocrine: Positive for cold intolerance.  Musculoskeletal: Positive for arthralgias, gait problem, joint swelling and myalgias.  Allergic/Immunologic: Positive for environmental allergies.  All other systems reviewed and are negative.   Past Medical History:  Diagnosis Date  . Breast cancer ([place]) 2007   Stage I (T1b N0 M0), grade 1 well-differentiated carcinoma of the left breast status, post lumpectomy followed by radiation therapy. Her estrogen receptor receptors were 93%, progesterone receptors 67%. HER-2/neu was negative. No lymphovascular space invasion was seen. All margins were clear. Ki-67 marker was low at 1% with surgery on 11/15/2004. Treated then with post-lumpectomy radiation, finish  . Breast cancer, left breast ([place]) 2007  . COPD (chronic obstructive pulmonary disease) ([place])   . Diabetes mellitus without complication ([place])   . Diverticula of colon   . Hyperlipidemia   . Hypertension   . Kidney stones   . Nicotine dependence   . Osteoarthritis     Past Surgical History:  Procedure Laterality Date  . ABDOMINAL HYSTERECTOMY    . BREAST SURGERY  2005 approx   left lumpectomy   . CHOLECYSTECTOMY N/A 05/26/2014   Procedure: LAPAROSCOPIC CHOLECYSTECTOMY;  Surgeon: Aviva Signs Md, MD;  Location: [facility];  Service: General;  Laterality: N/A;  . COLECTOMY     2005, diverticulitis  . COLONOSCOPY N/A 05/20/2017   Procedure: COLONOSCOPY;  Surgeon: Danie Binder, MD;   Location: [facility];  Service: Endoscopy;  Laterality: N/A;  1:45pm  . DILATION AND CURETTAGE OF UTERUS    . ESOPHAGOGASTRODUODENOSCOPY N/A 05/20/2017   Procedure: ESOPHAGOGASTRODUODENOSCOPY (EGD);  Surgeon: Danie Binder, MD;  Location: [facility];  Service: Endoscopy;  Laterality: N/A;  . left breast      cancer, in 2006  . TUBAL LIGATION      Family History  Problem Relation Age of Onset  . Breast cancer Mother   . COPD Father   . Emphysema Father   . Throat cancer Sister   . Glaucoma Brother   . Schizophrenia Brother   . Heart attack Brother   . Lung cancer Sister        former smoker  . Hepatitis C Daughter   . Seizures Daughter   . SIDS Son   . Colon cancer Neg Hx   . Colon polyps Neg Hx     Social History Social History   Tobacco Use  . Smoking status: Current Every Day Smoker    Packs/day: 1.00    Years: 50.00    Pack years: 50.00    Types: Cigarettes  . Smokeless tobacco: Never Used  . Tobacco comment: cigaretes  now 10 to 15 per day  Substance Use Topics  . Alcohol use: No    Alcohol/week: 0.0 oz  . Drug use: No    Allergies  Allergen Reactions  . Penicillins Hives, Shortness Of Breath and Swelling    Has patient  had a PCN reaction causing immediate rash, facial/tongue/throat swelling, SOB or lightheadedness with hypotension: yes Has patient had a PCN reaction causing severe rash involving mucus membranes or skin necrosis:no Has patient had a PCN reaction that required hospitalization: yes Has patient had a PCN reaction occurring within the last 10 years: no If all of the above answers are "NO", then may proceed with Cephalosporin use.  . Sulfonamide Derivatives Other (See Comments)    Shaking all over, seizure like symptoms. Hospitalization resulted     Current Outpatient Medications  Medication Sig Dispense Refill  . acetaminophen (TYLENOL) 500 MG tablet Take 500 mg by mouth every 6 (six) hours as needed for moderate pain.     Marland Kitchen albuterol  (PROVENTIL) (2.5 MG/3ML) 0.083% nebulizer solution Take 2.5 mg by nebulization 3 (three) times daily as needed for wheezing or shortness of breath.    Marland Kitchen amLODipine (NORVASC) 5 MG tablet Take 1 tablet (5 mg total) by mouth daily. 90 tablet 3  . aspirin EC 81 MG tablet Take 81 mg by mouth daily.    . ATROVENT HFA 17 MCG/ACT inhaler INHALE 2 PUFFS BY MOUTH FOUR TIMES A DAY. 12.9 g 0  . azelastine (ASTELIN) 0.1 % nasal spray PLACE 2 SPRAYS INTO EACH NOSTRIL TWICE DAILY. 30 mL 5  . benazepril (LOTENSIN) 40 MG tablet TAKE ONE TABLET BY MOUTH DAILY. (Patient taking differently: TAKE 40 mg BY MOUTH DAILY.) 90 tablet 0  . benazepril (LOTENSIN) 40 MG tablet TAKE ONE TABLET BY MOUTH DAILY. 90 tablet 0  . Calcium Carbonate-Vitamin D (CALCIUM 600 + D PO) Take 1 tablet by mouth 2 (two) times daily. 600/400    . fluticasone (FLONASE) 50 MCG/ACT nasal spray Place 2 sprays into both nostrils daily.    Marland Kitchen ibuprofen (ADVIL,MOTRIN) 200 MG tablet Take 400 mg by mouth every 6 (six) hours as needed for mild pain or moderate pain.    Marland Kitchen loratadine (CLARITIN) 10 MG tablet TAKE 1 TABLET BY MOUTH ONCE DAILY FOR ALLERGIES. (Patient taking differently: Take 10 mg by mouth daily. ) 90 tablet 1  . montelukast (SINGULAIR) 10 MG tablet TAKE 1 TABLET BY MOUTH AT BEDTIME. (Patient taking differently: TAke 10 mg BY MOUTH AT BEDTIME.) 90 tablet 0  . Omega-3 Fatty Acids (FISH OIL) 1000 MG CAPS Take 1 capsule by mouth 2 (two) times daily.     . polyethylene glycol powder (GLYCOLAX/MIRALAX) powder MIX 1 CAPFUL IN 8 OUNCES OF JUICE OR WATER AND DRINK ONCE DAILY. 527 g 5  . potassium chloride SA (K-DUR,KLOR-CON) 20 MEQ tablet Take 1 tablet (20 mEq total) by mouth daily. 30 tablet 5  . simvastatin (ZOCOR) 20 MG tablet TAKE (1) TABLET BY MOUTH DAILY AT 6 PM FOR CHOLESTEROL. (Patient taking differently: TAKE 20 MG BY MOUTH DAILY AT 6 PM FOR CHOLESTEROL.) 90 tablet 0  . SYMBICORT 160-4.5 MCG/ACT inhaler INHALE 2 PUFFS INTO THE LUNGS TWICE DAILY.  10.2 g 0  . tiZANidine (ZANAFLEX) 4 MG tablet TAKE ONE TABLET EVERY 8 HOURS AS NEEDED FOR MUSCLE SPASMS. (Patient taking differently: TAKE 4 MG EVERY 8 HOURS AS NEEDED FOR MUSCLE SPASMS.) 45 tablet 0  . traMADol (ULTRAM) 50 MG tablet TAKE (1) TABLET BY MOUTH EVERY SIX HOURS AS NEEDED. 60 tablet 0   No current facility-administered medications for this visit.      Physical Exam  Blood pressure 140/90, pulse 98, temperature 98.2 F (36.8 C), height '5\' 4"'  (1.626 m), weight 160 lb (72.6 kg).  Constitutional: overall normal  hygiene, normal nutrition, well developed, normal grooming, normal body habitus. Assistive device:none  Musculoskeletal: gait and station Limp none, muscle tone and strength are normal, no tremors or atrophy is present.  .  Neurological: coordination overall normal.  Deep tendon reflex/nerve stretch intact.  Sensation normal.  Cranial nerves II-XII intact.   Skin:   Normal overall no scars, lesions, ulcers or rashes. No psoriasis.  Psychiatric: Alert and oriented x 3.  Recent memory intact, remote memory unclear.  Normal mood and affect. Well groomed.  Good eye contact.  Cardiovascular: overall no swelling, no varicosities, no edema bilaterally, normal temperatures of the legs and arms, no clubbing, cyanosis and good capillary refill.  Lymphatic: palpation is normal.  All other systems reviewed and are negative   The patient has been educated about the nature of the problem(s) and counseled on treatment options.  The patient appeared to understand what I have discussed and is in agreement with it.  Encounter Diagnoses  Name Primary?  . Trochanteric bursitis of right hip Yes  . Cigarette nicotine dependence without complication     PLAN Call if any problems.  Precautions discussed.  Continue current medications.   Return to clinic 3 months   Electronically Signed Sanjuana Kava, MD 5/14/20191:40 PM

## 2017-05-29 ENCOUNTER — Other Ambulatory Visit: Payer: Self-pay | Admitting: Family Medicine

## 2017-05-30 NOTE — Telephone Encounter (Signed)
Patient called to inquire about rx refill for tiszanadye [phone]

## 2017-06-04 ENCOUNTER — Telehealth: Payer: Self-pay

## 2017-06-04 ENCOUNTER — Other Ambulatory Visit: Payer: Self-pay

## 2017-06-04 DIAGNOSIS — I10 Essential (primary) hypertension: Secondary | ICD-10-CM

## 2017-06-04 DIAGNOSIS — E119 Type 2 diabetes mellitus without complications: Secondary | ICD-10-CM

## 2017-06-04 DIAGNOSIS — E785 Hyperlipidemia, unspecified: Secondary | ICD-10-CM | POA: Diagnosis not present

## 2017-06-04 NOTE — Telephone Encounter (Signed)
Labs ordered.

## 2017-06-05 LAB — LIPID PANEL
CHOL/HDL RATIO: 2.4 (calc) (ref <5.0)
Cholesterol: 193 mg/dL (ref <200)
HDL: 79 mg/dL (ref >50)
LDL CHOLESTEROL (CALC): 91 mg/dL
Non-HDL Cholesterol (Calc): 114 mg/dL (calc) (ref <130)
TRIGLYCERIDES: 135 mg/dL (ref <150)

## 2017-06-05 LAB — COMPLETE METABOLIC PANEL WITH GFR
AG Ratio: 1.7 (calc) (ref 1.0–2.5)
ALT: 11 U/L (ref 6–29)
AST: 14 U/L (ref 10–35)
Albumin: 4.7 g/dL (ref 3.6–5.1)
Alkaline phosphatase (APISO): 73 U/L (ref 33–130)
BUN: 17 mg/dL (ref 7–25)
CALCIUM: 10.4 mg/dL (ref 8.6–10.4)
CO2: 27 mmol/L (ref 20–32)
CREATININE: 0.72 mg/dL (ref 0.60–0.93)
Chloride: 104 mmol/L (ref 98–110)
GFR, Est African American: 97 mL/min/{1.73_m2} (ref >=60)
GFR, Est Non African American: 84 mL/min/{1.73_m2} (ref >=60)
GLOBULIN: 2.8 g/dL (ref 1.9–3.7)
GLUCOSE: 98 mg/dL (ref 65–99)
Potassium: 4.6 mmol/L (ref 3.5–5.3)
SODIUM: 141 mmol/L (ref 135–146)
Total Bilirubin: 0.5 mg/dL (ref 0.2–1.2)
Total Protein: 7.5 g/dL (ref 6.1–8.1)

## 2017-06-05 LAB — HEMOGLOBIN A1C
Hgb A1c MFr Bld: 5.6 % of total Hgb (ref <5.7)
MEAN PLASMA GLUCOSE: 114 (calc)
eAG (mmol/L): 6.3 (calc)

## 2017-06-11 ENCOUNTER — Encounter: Payer: Self-pay | Admitting: Family Medicine

## 2017-06-11 ENCOUNTER — Ambulatory Visit (INDEPENDENT_AMBULATORY_CARE_PROVIDER_SITE_OTHER): Payer: Medicare Other | Admitting: Family Medicine

## 2017-06-11 VITALS — BP 120/70 | HR 77 | Resp 16 | Ht 64.0 in | Wt 158.0 lb

## 2017-06-11 DIAGNOSIS — F17210 Nicotine dependence, cigarettes, uncomplicated: Secondary | ICD-10-CM | POA: Diagnosis not present

## 2017-06-11 DIAGNOSIS — E785 Hyperlipidemia, unspecified: Secondary | ICD-10-CM | POA: Diagnosis not present

## 2017-06-11 DIAGNOSIS — J449 Chronic obstructive pulmonary disease, unspecified: Secondary | ICD-10-CM

## 2017-06-11 DIAGNOSIS — I10 Essential (primary) hypertension: Secondary | ICD-10-CM

## 2017-06-11 DIAGNOSIS — F17200 Nicotine dependence, unspecified, uncomplicated: Secondary | ICD-10-CM

## 2017-06-11 MED ORDER — VARENICLINE TARTRATE 0.5 MG X 11 & 1 MG X 42 PO MISC: ORAL | 0 refills | Status: DC

## 2017-06-11 NOTE — Patient Instructions (Addendum)
F/U end August, call if you need me sooner  Congratulations on excellent labs and  Deciding to quit smoking  New medication sent to help, also look at [facility] class and call [place] can apply for help at home because of breathing difficulty  You are NOT diabetic, you have cured yourself   Steps to Quit Smoking Smoking tobacco can be bad for your health. It can also affect almost every organ in your body. Smoking puts you and people around you at risk for many serious long-lasting (chronic) diseases. Quitting smoking is hard, but it is one of the best things that you can do for your health. It is never too late to quit. What are the benefits of quitting smoking? When you quit smoking, you lower your risk for getting serious diseases and conditions. They can include:  Lung cancer or lung disease.  Heart disease.  Stroke.  Heart attack.  Not being able to have children (infertility).  Weak bones (osteoporosis) and broken bones (fractures).  If you have coughing, wheezing, and shortness of breath, those symptoms may get better when you quit. You may also get sick less often. If you are pregnant, quitting smoking can help to lower your chances of having a baby of low birth weight. What can I do to help me quit smoking? Talk with your doctor about what can help you quit smoking. Some things you can do (strategies) include:  Quitting smoking totally, instead of slowly cutting back how much you smoke over a period of time.  Going to in-person counseling. You are more likely to quit if you go to many counseling sessions.  Using resources and support systems, such as: ? Database administrator with a Social worker. ? Phone quitlines. ? Careers information officer. ? Support groups or group counseling. ? Text messaging programs. ? Mobile phone apps or applications.  Taking medicines. Some of these medicines may have nicotine in them. If you are pregnant or breastfeeding, do not take  any medicines to quit smoking unless your doctor says it is okay. Talk with your doctor about counseling or other things that can help you.  Talk with your doctor about using more than one strategy at the same time, such as taking medicines while you are also going to in-person counseling. This can help make quitting easier. What things can I do to make it easier to quit? Quitting smoking might feel very hard at first, but there is a lot that you can do to make it easier. Take these steps:  Talk to your family and friends. Ask them to support and encourage you.  Call phone quitlines, reach out to support groups, or work with a Social worker.  Ask people who smoke to not smoke around you.  Avoid places that make you want (trigger) to smoke, such as: ? Bars. ? Parties. ? Smoke-break areas at work.  Spend time with people who do not smoke.  Lower the stress in your life. Stress can make you want to smoke. Try these things to help your stress: ? Getting regular exercise. ? Deep-breathing exercises. ? Yoga. ? Meditating. ? Doing a body scan. To do this, close your eyes, focus on one area of your body at a time from head to toe, and notice which parts of your body are tense. Try to relax the muscles in those areas.  Download or buy apps on your mobile phone or tablet that can help you stick to your quit plan. There are  many free apps, such as QuitGuide from the [employer] Office manager for Disease Control and Prevention). You can find more support from smokefree.gov and other websites.  This information is not intended to replace advice given to you by your health care provider. Make sure you discuss any questions you have with your health care provider. Document Released: 10/28/2008 Document Revised: 08/30/2015 Document Reviewed: 05/18/2014 Elsevier Interactive Patient Education  2018 [employer].

## 2017-06-16 ENCOUNTER — Encounter: Payer: Self-pay | Admitting: Family Medicine

## 2017-06-16 NOTE — Assessment & Plan Note (Signed)
Controlled, no change in medication DASH diet and commitment to daily physical activity for a minimum of 30 minutes discussed and encouraged, as a part of hypertension management. The importance of attaining a healthy weight is also discussed.  BP/Weight 06/11/2017 05/28/2017 05/20/2017 04/23/2017 04/10/2017 04/04/2017 1/68/3729  Systolic BP 021 115 520 802 233 612 244  Diastolic BP 70 90 49 70 75 76 79  Wt. (Lbs) 158 160 167 154.12 153 152.4 157  BMI 27.12 27.46 28.67 27.3 27.1 27 26.95

## 2017-06-16 NOTE — Assessment & Plan Note (Signed)
Asked: confirms currently smokes 10 cigarettes/day Asesss: wants to quit Advise: needs to quit , personal h/o cancer and COPD, therefore risk of both increased Assist: chantix prescribed. [place] # provided and Orthoptist and written info at d/c Arrange: f/u in 8 weeks Time spent 5 mins

## 2017-06-16 NOTE — Progress Notes (Signed)
   [name]     MRN: [id number]      DOB: 1945-06-11   HPI Ms. Geiselman is here for follow up and re-evaluation of chronic medical conditions, medication management and review of any available recent lab and radiology data.  Preventive health is updated, specifically  Cancer screening and Immunization.    The PT denies any adverse reactions to current medications since the last visit.  There are no new concerns.  There are no specific complaints   ROS Denies recent fever or chills. Denies sinus pressure, nasal congestion, ear pain or sore throat. C/o  chest congestion, productive cough  Wheezing.and poor exercise tolerance requests Aide  Denies chest pains, palpitations and leg swelling Denies abdominal pain, nausea, vomiting,diarrhea or constipation.   Denies dysuria, frequency, hesitancy or incontinence. C/o  joint pain, and limitation in mobility. Denies headaches, seizures, numbness, or tingling. Denies depression, anxiety or insomnia. Denies skin break down or rash.   PE  BP 120/70   Pulse 77   Resp 16   Ht 5\' 4"  (1.626 m)   Wt 158 lb (71.7 kg)   SpO2 95%   BMI 27.12 kg/m   Patient alert and oriented and in no cardiopulmonary distress.  HEENT: No facial asymmetry, EOMI,   oropharynx pink and moist.  Neck supple no JVD, no mass.  Chest: decreased air entry scattered wheezes , no crackles  CVS: S1, S2 no murmurs, no S3.Regular rate.  ABD: Soft non tender.   Ext: No edema  MS: Adequate though reduced  ROM lumbar  spine, shoulders, hips and knees.  Skin: Intact, no ulcerations or rash noted.  Psych: Good eye contact, normal affect. Memory intact not anxious or depressed appearing.  CNS: CN 2-12 intact, power,  normal throughout.no focal deficits noted.   Assessment & Plan  Hypertension goal BP (blood pressure) < 130/80 Controlled, no change in medication DASH diet and commitment to daily physical activity for a minimum of 30 minutes discussed and  encouraged, as a part of hypertension management. The importance of attaining a healthy weight is also discussed.  BP/Weight 06/11/2017 05/28/2017 05/20/2017 04/23/2017 04/10/2017 04/04/2017 7/68/1157  Systolic BP 262 035 597 416 384 536 468  Diastolic BP 70 90 49 70 75 76 79  Wt. (Lbs) 158 160 167 154.12 153 152.4 157  BMI 27.12 27.46 28.67 27.3 27.1 27 26.95       COPD (chronic obstructive pulmonary disease) with chronic bronchitis (HCC) Controlled, no change in medication   NICOTINE ADDICTION Asked: confirms currently smokes 10 cigarettes/day Asesss: wants to quit Advise: needs to quit , personal h/o cancer and COPD, therefore risk of both increased Assist: chantix prescribed. [place] # provided and Orthoptist and written info at d/c Arrange: f/u in 8 weeks Time spent 5 mins   Hyperlipidemia LDL goal <100 Hyperlipidemia:Low fat diet discussed and encouraged.   Lipid Panel  Lab Results  Component Value Date   CHOL 193 06/04/2017   HDL 79 06/04/2017   LDLCALC 91 06/04/2017   TRIG 135 06/04/2017   CHOLHDL 2.4 06/04/2017     Controlled, no change in medication

## 2017-06-16 NOTE — Assessment & Plan Note (Signed)
Hyperlipidemia:Low fat diet discussed and encouraged.   Lipid Panel  Lab Results  Component Value Date   CHOL 193 06/04/2017   HDL 79 06/04/2017   LDLCALC 91 06/04/2017   TRIG 135 06/04/2017   CHOLHDL 2.4 06/04/2017     Controlled, no change in medication

## 2017-06-16 NOTE — Assessment & Plan Note (Signed)
Controlled, no change in medication  

## 2017-06-19 ENCOUNTER — Other Ambulatory Visit: Payer: Self-pay | Admitting: Family Medicine

## 2017-06-22 ENCOUNTER — Other Ambulatory Visit: Payer: Self-pay | Admitting: Orthopedic Surgery

## 2017-07-01 ENCOUNTER — Other Ambulatory Visit: Payer: Self-pay | Admitting: Family Medicine

## 2017-07-02 ENCOUNTER — Other Ambulatory Visit: Payer: Self-pay | Admitting: Family Medicine

## 2017-07-23 DIAGNOSIS — H2513 Age-related nuclear cataract, bilateral: Secondary | ICD-10-CM | POA: Diagnosis not present

## 2017-07-29 ENCOUNTER — Other Ambulatory Visit: Payer: Self-pay | Admitting: Family Medicine

## 2017-08-02 ENCOUNTER — Other Ambulatory Visit: Payer: Self-pay | Admitting: Family Medicine

## 2017-08-02 ENCOUNTER — Other Ambulatory Visit: Payer: Self-pay | Admitting: Orthopaedic Surgery

## 2017-08-05 ENCOUNTER — Other Ambulatory Visit: Payer: Self-pay | Admitting: Family Medicine

## 2017-08-21 ENCOUNTER — Other Ambulatory Visit: Payer: Self-pay | Admitting: Family Medicine

## 2017-08-22 ENCOUNTER — Encounter: Payer: Self-pay | Admitting: Gastroenterology

## 2017-08-26 ENCOUNTER — Other Ambulatory Visit: Payer: Self-pay | Admitting: Family Medicine

## 2017-08-27 ENCOUNTER — Other Ambulatory Visit: Payer: Self-pay | Admitting: Family Medicine

## 2017-08-28 ENCOUNTER — Encounter: Payer: Self-pay | Admitting: Orthopaedic Surgery

## 2017-08-28 ENCOUNTER — Ambulatory Visit (INDEPENDENT_AMBULATORY_CARE_PROVIDER_SITE_OTHER): Payer: Medicare Other | Admitting: Orthopaedic Surgery

## 2017-08-28 VITALS — BP 140/74 | HR 89 | Ht 64.0 in | Wt 155.0 lb

## 2017-08-28 DIAGNOSIS — M7061 Trochanteric bursitis, right hip: Secondary | ICD-10-CM

## 2017-08-28 DIAGNOSIS — F17210 Nicotine dependence, cigarettes, uncomplicated: Secondary | ICD-10-CM

## 2017-08-28 NOTE — Progress Notes (Signed)
PROCEDURE NOTE:  The patient request injection, verbal consent was obtained.  The right trochanteric area of the hip was prepped appropriately after time out was performed.   Sterile technique was observed and injection of 1 cc of Depo-Medrol 40 mg with several cc's of plain xylocaine. Anesthesia was provided by ethyl chloride and a 20-gauge needle was used to inject the hip area. The injection was tolerated well.  A band aid dressing was applied.  The patient was advised to apply ice later today and tomorrow to the injection sight as needed.  She has cut back on her smoking to 4 a day.  Return in three months.  Encounter Diagnoses  Name Primary?  . Trochanteric bursitis of right hip Yes  . Cigarette nicotine dependence without complication    Call if any problem.  Precautions discussed.   Electronically Signed Sanjuana Kava, MD 8/14/20191:52 PM

## 2017-09-02 ENCOUNTER — Other Ambulatory Visit: Payer: Self-pay | Admitting: Family Medicine

## 2017-09-09 ENCOUNTER — Other Ambulatory Visit: Payer: Self-pay

## 2017-09-09 ENCOUNTER — Ambulatory Visit (INDEPENDENT_AMBULATORY_CARE_PROVIDER_SITE_OTHER): Payer: Medicare Other | Admitting: Family Medicine

## 2017-09-09 ENCOUNTER — Encounter: Payer: Self-pay | Admitting: Family Medicine

## 2017-09-09 VITALS — BP 130/56 | HR 100 | Resp 12 | Ht 64.0 in | Wt 158.1 lb

## 2017-09-09 DIAGNOSIS — F17200 Nicotine dependence, unspecified, uncomplicated: Secondary | ICD-10-CM

## 2017-09-09 DIAGNOSIS — R918 Other nonspecific abnormal finding of lung field: Secondary | ICD-10-CM | POA: Diagnosis not present

## 2017-09-09 DIAGNOSIS — E785 Hyperlipidemia, unspecified: Secondary | ICD-10-CM

## 2017-09-09 DIAGNOSIS — Z23 Encounter for immunization: Secondary | ICD-10-CM

## 2017-09-09 DIAGNOSIS — I10 Essential (primary) hypertension: Secondary | ICD-10-CM

## 2017-09-09 NOTE — Assessment & Plan Note (Signed)
Controlled, no change in medication DASH diet and commitment to daily physical activity for a minimum of 30 minutes discussed and encouraged, as a part of hypertension management. The importance of attaining a healthy weight is also discussed.  BP/Weight 09/09/2017 08/28/2017 06/11/2017 05/28/2017 05/20/2017 04/23/2017 9/96/9249  Systolic BP 324 199 144 458 483 507 573  Diastolic BP 56 74 70 90 49 70 75  Wt. (Lbs) 158.12 155 158 160 167 154.12 153  BMI 27.14 26.61 27.12 27.46 28.67 27.3 27.1

## 2017-09-09 NOTE — Patient Instructions (Addendum)
Wellness with nurse October 30 or shortly after  [place] MD JAN 10 OR AFTER  Congrats on cutting down to 2 cigarettes / day  ON CUTTING BACK down to 2/ day, NEED TO QUIT, no later than your birthday  Pt to leave with lab order fasting lipid, cmp and EGFR, TSH and vit D 1 middle October ([facility])  Flu vaccine today  You need your screening chest scan and this is ordered we will call with appt

## 2017-09-09 NOTE — Assessment & Plan Note (Addendum)
LUD:APTCKFWBL smoking 2 cigarettes/ day Assess: wants to  Quit  Advise:needs to quit to reduce cancer, heart disease, stroke and further lung damage, has COPD and also lung nodules  Assist:Counseled for 5 minutes, information and QUIT NOW # provided Arrange : 2 and 4 months follow up

## 2017-09-09 NOTE — Progress Notes (Signed)
   COURTLYNN HOLLOMAN     MRN: [id number]      DOB: 1945-06-05   HPI Ms. Ebbs is here for follow up and re-evaluation of chronic medical conditions, medication management and review of any available recent lab and radiology data.  Preventive health is updated, specifically  Cancer screening and Immunization.   Questions or concerns regarding consultations or procedures which the PT has had in the interim are  addressed. The PT denies any adverse reactions to current medications since the last visit.  Positive intermittent chills, mucus is clear, no fever no green sinus drainage  ROS Denies recent feverDenies sinus pressure, nasal congestion, ear pain or sore throat. Chronic smokers cough with chest congestion, productive cough  Is clear ,and she has intermittent  wheezing. Denies chest pains, palpitations and leg swelling Denies abdominal pain, nausea, vomiting,diarrhea or constipation.   Denies dysuria, frequency, hesitancy or incontinence. C/o chronic  joint pain, swelling and limitation in mobility.Managed by ortho Denies headaches, seizures, numbness, or tingling. Denies depression,  Uncontrolled anxiety or insomnia. Denies skin break down or rash.   PE  BP (!) 130/56 (BP Location: Left Arm, Patient Position: Sitting, Cuff Size: Large)   Pulse 100   Resp 12   Ht 5\' 4"  (1.626 m)   Wt 158 lb 1.9 oz (71.7 kg)   SpO2 90% Comment: room air  BMI 27.14 kg/m   Patient alert and oriented and in no cardiopulmonary distress.  HEENT: No facial asymmetry, EOMI,   oropharynx pink and moist.  Neck supple no JVD, no mass.  Chest: Clear to auscultation bilaterally.  CVS: S1, S2 no murmurs, no S3.Regular rate.  ABD: Soft non tender.   Ext: No edema  MS: Adequate ROM spine, shoulders, hips and knees.  Skin: Intact, no ulcerations or rash noted.  Psych: Good eye contact, normal affect. Memory intact not anxious or depressed appearing.  CNS: CN 2-12 intact, power,  normal throughout.no  focal deficits noted.   Assessment & Plan  Hypertension goal BP (blood pressure) < 130/80 Controlled, no change in medication DASH diet and commitment to daily physical activity for a minimum of 30 minutes discussed and encouraged, as a part of hypertension management. The importance of attaining a healthy weight is also discussed.  BP/Weight 09/09/2017 08/28/2017 06/11/2017 05/28/2017 05/20/2017 04/23/2017 03/12/3333  Systolic BP 456 256 389 373 428 768 115  Diastolic BP 56 74 70 90 49 70 75  Wt. (Lbs) 158.12 155 158 160 167 154.12 153  BMI 27.14 26.61 27.12 27.46 28.67 27.3 27.1       NICOTINE ADDICTION BWI:OMBTDHRCB smoking 2 cigarettes/ day Assess: wants to  Quit  Advise:needs to quit to reduce cancer, heart disease, stroke and further lung damage, has COPD and also lung nodules  Assist:Counseled for 5 minutes, information and QUIT NOW # provided Arrange : 2 and 4 months follow up   Need for immunization against influenza After obtaining informed consent, the vaccine is  administered by LPN.   Pulmonary nodules Lung cancer screening test ordered

## 2017-09-10 ENCOUNTER — Ambulatory Visit (HOSPITAL_COMMUNITY): Payer: Medicare Other | Admitting: Internal Medicine

## 2017-09-15 ENCOUNTER — Encounter: Payer: Self-pay | Admitting: Family Medicine

## 2017-09-15 NOTE — Assessment & Plan Note (Signed)
Lung cancer screening test ordered

## 2017-09-15 NOTE — Assessment & Plan Note (Signed)
After obtaining informed consent, the vaccine is  administered by LPN.  

## 2017-09-17 ENCOUNTER — Inpatient Hospital Stay (HOSPITAL_COMMUNITY): Payer: Medicare Other

## 2017-09-17 ENCOUNTER — Encounter (HOSPITAL_COMMUNITY): Payer: Self-pay | Admitting: Internal Medicine

## 2017-09-17 ENCOUNTER — Inpatient Hospital Stay (HOSPITAL_COMMUNITY): Payer: Medicare Other | Attending: Hematology | Admitting: Internal Medicine

## 2017-09-17 VITALS — BP 148/59 | HR 89 | Temp 98.6°F | Resp 16 | Wt 160.6 lb

## 2017-09-17 DIAGNOSIS — Z170 Estrogen receptor positive status [ER+]: Principal | ICD-10-CM

## 2017-09-17 DIAGNOSIS — C50212 Malignant neoplasm of upper-inner quadrant of left female breast: Secondary | ICD-10-CM

## 2017-09-17 DIAGNOSIS — Z923 Personal history of irradiation: Secondary | ICD-10-CM

## 2017-09-17 DIAGNOSIS — J449 Chronic obstructive pulmonary disease, unspecified: Secondary | ICD-10-CM

## 2017-09-17 DIAGNOSIS — R918 Other nonspecific abnormal finding of lung field: Secondary | ICD-10-CM | POA: Diagnosis not present

## 2017-09-17 DIAGNOSIS — F17210 Nicotine dependence, cigarettes, uncomplicated: Secondary | ICD-10-CM

## 2017-09-17 DIAGNOSIS — Z853 Personal history of malignant neoplasm of breast: Secondary | ICD-10-CM | POA: Diagnosis not present

## 2017-09-17 DIAGNOSIS — I10 Essential (primary) hypertension: Secondary | ICD-10-CM

## 2017-09-17 LAB — COMPREHENSIVE METABOLIC PANEL
ALK PHOS: 70 U/L (ref 38–126)
ALT: 17 U/L (ref 0–44)
AST: 17 U/L (ref 15–41)
Albumin: 4.1 g/dL (ref 3.5–5.0)
Anion gap: 8 (ref 5–15)
BILIRUBIN TOTAL: 0.6 mg/dL (ref 0.3–1.2)
BUN: 20 mg/dL (ref 8–23)
CALCIUM: 9.5 mg/dL (ref 8.9–10.3)
CO2: 27 mmol/L (ref 22–32)
CREATININE: 0.8 mg/dL (ref 0.44–1.00)
Chloride: 103 mmol/L (ref 98–111)
GFR calc Af Amer: >60 mL/min (ref >60)
Glucose, Bld: 87 mg/dL (ref 70–99)
Potassium: 4.2 mmol/L (ref 3.5–5.1)
Sodium: 138 mmol/L (ref 135–145)
TOTAL PROTEIN: 7.6 g/dL (ref 6.5–8.1)

## 2017-09-17 LAB — CBC WITH DIFFERENTIAL/PLATELET
BASOS PCT: 1 %
Basophils Absolute: 0.1 10*3/uL (ref 0.0–0.1)
Eosinophils Absolute: 0.4 10*3/uL (ref 0.0–0.7)
Eosinophils Relative: 3 %
HEMATOCRIT: 44.6 % (ref 36.0–46.0)
HEMOGLOBIN: 14.1 g/dL (ref 12.0–15.0)
LYMPHS ABS: 2.8 10*3/uL (ref 0.7–4.0)
Lymphocytes Relative: 25 %
MCH: 28.9 pg (ref 26.0–34.0)
MCHC: 31.6 g/dL (ref 30.0–36.0)
MCV: 91.4 fL (ref 78.0–100.0)
MONOS PCT: 12 %
Monocytes Absolute: 1.4 10*3/uL — ABNORMAL HIGH (ref 0.1–1.0)
NEUTROS ABS: 7 10*3/uL (ref 1.7–7.7)
NEUTROS PCT: 59 %
Platelets: 331 10*3/uL (ref 150–400)
RBC: 4.88 MIL/uL (ref 3.87–5.11)
RDW: 14.4 % (ref 11.5–15.5)
WBC: 11.6 10*3/uL — AB (ref 4.0–10.5)

## 2017-09-17 LAB — LACTATE DEHYDROGENASE: LDH: 143 U/L (ref 98–192)

## 2017-09-17 MED ORDER — AZITHROMYCIN 250 MG PO TABS: ORAL_TABLET | ORAL | 0 refills | Status: DC

## 2017-09-17 NOTE — Patient Instructions (Signed)
[facility] at [facility] Discharge Instructions  You saw Dr. Higgs today.   Thank you for choosing [facility] at [facility] to provide your oncology and hematology care.  To afford each patient quality time with our provider, please arrive at least 15 minutes before your scheduled appointment time.   If you have a lab appointment with the Cancer Center please come in thru the  Main Entrance and check in at the main information desk  You need to re-schedule your appointment should you arrive 10 or more minutes late.  We strive to give you quality time with our providers, and arriving late affects you and other patients whose appointments are after yours.  Also, if you no show three or more times for appointments you may be dismissed from the clinic at the providers discretion.     Again, thank you for choosing [facility].  Our hope is that these requests will decrease the amount of time that you wait before being seen by our physicians.       _____________________________________________________________  Should you have questions after your visit to [facility], please contact our office at [phone] between the hours of 8:00 a.m. and 4:30 p.m.  Voicemails left after 4:00 p.m. will not be returned until the following business day.  For prescription refill requests, have your pharmacy contact our office and allow 72 hours.    Cancer Center Support Programs:   > Cancer Support Group  2nd Tuesday of the month 1pm-2pm, Journey Room    

## 2017-09-17 NOTE — Progress Notes (Signed)
Diagnosis Malignant neoplasm of upper-inner quadrant of left breast in female, estrogen receptor positive ([place]) - Plan: CBC with Differential/Platelet, Comprehensive metabolic panel, Lactate dehydrogenase, CBC with Differential/Platelet, Comprehensive metabolic panel, Lactate dehydrogenase, MM Digital Screening  Staging Cancer Staging Breast cancer ([place]) Staging form: Breast, AJCC 7th Edition - Clinical: Stage IA (T1b, N0, cM0) - Signed by Baird Cancer, PA on 03/11/2012   Assessment and Plan:   1.   Stage IA invasive ductal carcinoma of (L) breast; ER+/PR+/HER2-.  Pt was diagnosed in 10/2004.  Treated with length lumpectomy followed by adjuvant radiation therapy, completing treatment on 02/22/05.  Started antiestrogen therapy with Aromasin in 11/2004, which was switched to Femara due to intolerance.  She completed 5 total years of anti-estrogen therapy on 01/14/10.  Pt had screening mammogram on 03/06/17 that was negative for malignancy.  She is set up for screening mammogram in 02/2018.  Labs done 09/17/2017 reviewed and showed WBC 11.6 HB 14 plts 331,000.  Chemistries WNL with K+ 4.2 Cr 0.80 and normal LFTS.  She will follow-up at that time to go over results.   She is now 13 year out from diagnosis.    2.  Recurrent bronchitis.  Pt continues to smoke.  She had CT scan done by Dr. Moshe Cipro in 01/2016 that showed Impression:   Mild biapical pleuroparenchymal scarring. Subpleural radiation fibrosis in the left upper lobe. A few scattered pulmonary nodules measure 2 mm or less in size. No pleural fluid. Airway is unremarkable.   She continues to smoke.  She is requesting Rx for Zpac as she did previously at last visit.  I have given her a rX for Zpac, but have requesting that she follow-up with Dr. Moshe Cipro for ongoing management if ongoing respiratory symptoms.  Pt afebrile in clinic and has WBC 11,000.  She should also follow-up with Dr. Moshe Cipro regarding repeat chest CT due to ongoing smoking  history.    3.  HTN.  BP is 148/59.  Follow-up with Dr. Moshe Cipro.    4  Health maintenance.  Follow-up with Dr. Moshe Cipro and GI as recommended.  Smoking cessation recommended.    30 minutes spent with more than 50% spent in counseling and coordination of care.    Interval History:  Historical data obtained from note dated 04/10/2017.  Stage IA invasive ductal carcinoma of (L) breast; ER+/PR+/HER2-.  Pt was diagnosed in 10/2004.  Treated with length lumpectomy followed by adjuvant radiation therapy, completing treatment on 02/22/05.  Started antiestrogen therapy with Aromasin in 11/2004, which was switched to Femara due to intolerance.  She completed 5 total years of anti-estrogen therapy on 01/14/10.  Current Status:  Pt is seen today for follow-up.  She reports cough.  She continues to smoke.    PATHOLOGY:  (L) lumpectomy surgical path: 11/15/04       Breast cancer ([place])   11/15/2004 Surgery    Lumpectomy     - 02/22/2005 Radiation Therapy       12/11/2004 -  Chemotherapy    Aromasin     Adverse Reaction    Intolerance to Aromasin     - 01/14/2010 Chemotherapy    Femara      Problem List Patient Active Problem List   Diagnosis Date Noted  . Melena [K92.1] 04/04/2017  . Heme positive stool [R19.5] 04/04/2017  . Allergic rhinitis [J30.9] 01/04/2015  . 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[place]) [I77.819] 09/26/2014  . Hydrosalpinx [N70.11] 09/28/2013  . Need for immunization against influenza [Z23] 09/17/2013  . Osteopenia [M85.80]  07/15/2013  . postmenopausal ovarian cyst [N83.209] 04/06/2013  . Pulmonary nodules [R91.8] 04/04/2012  . Breast cancer ([place]) [C50.919] 03/11/2012  . Ganglion cyst [M67.40] 05/08/2011  . COPD (chronic obstructive pulmonary disease) with chronic bronchitis ([place]) [J44.9] 07/24/2009  . Hyperlipidemia LDL goal <100 [E78.5] 03/09/2009  . CONSTIPATION, CHRONIC [K59.09] 03/09/2009  . NICOTINE ADDICTION [F17.200] 10/10/2008  . Hypertension goal BP (blood pressure) < 130/80  [I10] 09/29/2008  . GENERALIZED OSTEOARTHROSIS UNSPECIFIED SITE [M15.9] 09/29/2008    Past Medical History Past Medical History:  Diagnosis Date  . Breast cancer ([place]) 2007   Stage I (T1b N0 M0), grade 1 well-differentiated carcinoma of the left breast status, post lumpectomy followed by radiation therapy. Her estrogen receptor receptors were 93%, progesterone receptors 67%. HER-2/neu was negative. No lymphovascular space invasion was seen. All margins were clear. Ki-67 marker was low at 1% with surgery on 11/15/2004. Treated then with post-lumpectomy radiation, finish  . Breast cancer, left breast ([place]) 2007  . COPD (chronic obstructive pulmonary disease) ([place])   . Diabetes mellitus without complication ([place])   . Diverticula of colon   . Hyperlipidemia   . Hypertension   . Kidney stones   . Nicotine dependence   . Osteoarthritis     Past Surgical History Past Surgical History:  Procedure Laterality Date  . ABDOMINAL HYSTERECTOMY    . BREAST SURGERY  2005 approx   left lumpectomy   . CHOLECYSTECTOMY N/A 05/26/2014   Procedure: LAPAROSCOPIC CHOLECYSTECTOMY;  Surgeon: Aviva Signs Md, MD;  Location: [facility];  Service: General;  Laterality: N/A;  . COLECTOMY     2005, diverticulitis  . COLONOSCOPY N/A 05/20/2017   Procedure: COLONOSCOPY;  Surgeon: Danie Binder, MD;  Location: [facility]ITE;  Service: Endoscopy;  Laterality: N/A;  1:45pm  . DILATION AND CURETTAGE OF UTERUS    . ESOPHAGOGASTRODUODENOSCOPY N/A 05/20/2017   Procedure: ESOPHAGOGASTRODUODENOSCOPY (EGD);  Surgeon: Danie Binder, MD;  Location: [facility];  Service: Endoscopy;  Laterality: N/A;  . left breast      cancer, in 2006  . TUBAL LIGATION      Family History Family History  Problem Relation Age of Onset  . Breast cancer Mother   . COPD Father   . Emphysema Father   . Throat cancer Sister   . Glaucoma Brother   . Schizophrenia Brother   . Heart attack Brother   . Lung cancer Sister        former  smoker  . Hepatitis C Daughter   . Seizures Daughter   . SIDS Son   . Colon cancer Neg Hx   . Colon polyps Neg Hx      Social History  reports that she has been smoking cigarettes. She has a 50.00 pack-year smoking history. She has never used smokeless tobacco. She reports that she does not drink alcohol or use drugs.  Medications  Current Outpatient Medications:  .  acetaminophen (TYLENOL) 500 MG tablet, Take 500 mg by mouth every 6 (six) hours as needed for moderate pain. , Disp: , Rfl:  .  albuterol (PROVENTIL) (2.5 MG/3ML) 0.083% nebulizer solution, Take 2.5 mg by nebulization 3 (three) times daily as needed for wheezing or shortness of breath., Disp: , Rfl:  .  amLODipine (NORVASC) 5 MG tablet, Take 1 tablet (5 mg total) by mouth daily., Disp: 90 tablet, Rfl: 3 .  ATROVENT HFA 17 MCG/ACT inhaler, INHALE 2 PUFFS BY MOUTH FOUR TIMES A DAY., Disp: 12.9 g, Rfl: 0 .  azelastine (ASTELIN) 0.1 % nasal spray, PLACE 2 SPRAYS INTO EACH NOSTRIL TWICE DAILY., Disp: 30 mL, Rfl: 5 .  Azelastine HCl 137 MCG/SPRAY SOLN, PLACE 2 SPRAYS INTO EACH NOSTRIL TWICE DAILY., Disp: 30 mL, Rfl: 0 .  benazepril (LOTENSIN) 40 MG tablet, TAKE ONE TABLET BY MOUTH DAILY., Disp: 90 tablet, Rfl: 2 .  Calcium Carbonate-Vitamin D (CALCIUM 600 + D PO), Take 1 tablet by mouth 2 (two) times daily. 600/400, Disp: , Rfl:  .  fluticasone (FLONASE) 50 MCG/ACT nasal spray, Place 2 sprays into both nostrils daily., Disp: , Rfl:  .  loratadine (CLARITIN) 10 MG tablet, TAKE 1 TABLET BY MOUTH ONCE DAILY FOR ALLERGIES. (Patient taking differently: Take 10 mg by mouth daily. ), Disp: 90 tablet, Rfl: 1 .  montelukast (SINGULAIR) 10 MG tablet, TAKE 1 TABLET BY MOUTH AT BEDTIME., Disp: 90 tablet, Rfl: 0 .  Omega-3 Fatty Acids (FISH OIL) 1000 MG CAPS, Take 1 capsule by mouth 2 (two) times daily. , Disp: , Rfl:  .  polyethylene glycol powder (GLYCOLAX/MIRALAX) powder, MIX 1 CAPFUL IN 8 OUNCES OF JUICE OR WATER AND DRINK ONCE DAILY., Disp:  527 g, Rfl: 5 .  potassium chloride SA (K-DUR,KLOR-CON) 20 MEQ tablet, Take 1 tablet (20 mEq total) by mouth daily., Disp: 30 tablet, Rfl: 5 .  simvastatin (ZOCOR) 20 MG tablet, TAKE (1) TABLET BY MOUTH DAILY AT 6 PM FOR CHOLESTEROL., Disp: 90 tablet, Rfl: 0 .  SYMBICORT 160-4.5 MCG/ACT inhaler, INHALE 2 PUFFS INTO THE LUNGS TWICE DAILY., Disp: 10.2 g, Rfl: 0 .  tiZANidine (ZANAFLEX) 4 MG tablet, TAKE ONE TABLET EVERY 8 HOURS AS NEEDED FOR MUSCLE SPASMS., Disp: 45 tablet, Rfl: 0 .  traMADol (ULTRAM) 50 MG tablet, TAKE (1) TABLET BY MOUTH EVERY SIX HOURS AS NEEDED., Disp: 60 tablet, Rfl: 3 .  varenicline (CHANTIX PAK) 0.5 MG X 11 & 1 MG X 42 tablet, Take one 0.5 mg tablet by mouth once daily for 3 days, then increase to one 0.5 mg tablet twice daily for 4 days, then increase to one 1 mg tablet twice daily., Disp: 53 tablet, Rfl: 0 .  azithromycin (ZITHROMAX Z-PAK) 250 MG tablet, 2 tab po x 1 day then 1 pill daily for 4 days, Disp: 6 each, Rfl: 0  Allergies Penicillins and Sulfonamide derivatives  Review of Systems Review of Systems - Oncology ROS negative other than cough and sinus drainage.     Physical Exam  Vitals Wt Readings from Last 3 Encounters:  09/17/17 160 lb 9.6 oz (72.8 kg)  09/09/17 158 lb 1.9 oz (71.7 kg)  08/28/17 155 lb (70.3 kg)   Temp Readings from Last 3 Encounters:  09/17/17 98.6 F (37 C) (Oral)  05/28/17 98.2 F (36.8 C)  05/20/17 97.9 F (36.6 C) (Oral)   BP Readings from Last 3 Encounters:  09/17/17 (!) 148/59  09/09/17 (!) 130/56  08/28/17 140/74   Pulse Readings from Last 3 Encounters:  09/17/17 89  09/09/17 100  08/28/17 89   Constitutional: Well-developed, well-nourished, and in no distress.   HENT: Head: Normocephalic and atraumatic.  Mouth/Throat: No oropharyngeal exudate. Mucosa moist. Eyes: Pupils are equal, round, and reactive to light. Conjunctivae are normal. No scleral icterus.  Neck: Normal range of motion. Neck supple. No JVD  present.  Cardiovascular: Normal rate, regular rhythm and normal heart sounds.  Exam reveals no gallop and no friction rub.   No murmur heard. Pulmonary/Chest: Effort normal.  Coarse Breath sounds Abdominal: Soft. Bowel sounds are normal.  No distension. There is no tenderness. There is no guarding.  Musculoskeletal: No edema or tenderness.  Lymphadenopathy: No cervical, axillary or supraclavicular adenopathy.  Neurological: Alert and oriented to person, place, and time. No cranial nerve deficit.  Skin: Skin is warm and dry. No rash noted. No erythema. No pallor.  Psychiatric: Affect and judgment normal.  Breast exam:  Chaperone present.  Left lumpectomy changes noted.  No palpable abnormalities noted bilaterally.    Labs Appointment on 09/17/2017  Component Date Value Ref Range Status  . WBC 09/17/2017 11.6* 4.0 - 10.5 K/uL Final  . RBC 09/17/2017 4.88  3.87 - 5.11 MIL/uL Final  . Hemoglobin 09/17/2017 14.1  12.0 - 15.0 g/dL Final  . HCT 09/17/2017 44.6  36.0 - 46.0 % Final  . MCV 09/17/2017 91.4  78.0 - 100.0 fL Final  . MCH 09/17/2017 28.9  26.0 - 34.0 pg Final  . MCHC 09/17/2017 31.6  30.0 - 36.0 g/dL Final  . RDW 09/17/2017 14.4  11.5 - 15.5 % Final  . Platelets 09/17/2017 331  150 - 400 K/uL Final  . Neutrophils Relative % 09/17/2017 59  % Final  . Neutro Abs 09/17/2017 7.0  1.7 - 7.7 K/uL Final  . Lymphocytes Relative 09/17/2017 25  % Final  . Lymphs Abs 09/17/2017 2.8  0.7 - 4.0 K/uL Final  . Monocytes Relative 09/17/2017 12  % Final  . Monocytes Absolute 09/17/2017 1.4* 0.1 - 1.0 K/uL Final  . Eosinophils Relative 09/17/2017 3  % Final  . Eosinophils Absolute 09/17/2017 0.4  0.0 - 0.7 K/uL Final  . Basophils Relative 09/17/2017 1  % Final  . Basophils Absolute 09/17/2017 0.1  0.0 - 0.1 K/uL Final   Performed at [facility], [address] [place], [place] [address]  . Sodium 09/17/2017 138  135 - 145 mmol/L Final  . Potassium 09/17/2017 4.2  3.5 - 5.1 mmol/L Final  .  Chloride 09/17/2017 103  98 - 111 mmol/L Final  . CO2 09/17/2017 27  22 - 32 mmol/L Final  . Glucose, Bld 09/17/2017 87  70 - 99 mg/dL Final  . BUN 09/17/2017 20  8 - 23 mg/dL Final  . Creatinine, Ser 09/17/2017 0.80  0.44 - 1.00 mg/dL Final  . Calcium 09/17/2017 9.5  8.9 - 10.3 mg/dL Final  . Total Protein 09/17/2017 7.6  6.5 - 8.1 g/dL Final  . Albumin 09/17/2017 4.1  3.5 - 5.0 g/dL Final  . AST 09/17/2017 17  15 - 41 U/L Final  . ALT 09/17/2017 17  0 - 44 U/L Final  . Alkaline Phosphatase 09/17/2017 70  38 - 126 U/L Final  . Total Bilirubin 09/17/2017 0.6  0.3 - 1.2 mg/dL Final  . GFR calc non Af Amer 09/17/2017 >60  >60 mL/min Final  . GFR calc Af Amer 09/17/2017 >60  >60 mL/min Final   Comment: (NOTE) The eGFR has been calculated using the CKD EPI equation. This calculation has not been validated in all clinical situations. eGFR's persistently <60 mL/min signify possible Chronic Kidney Disease.   Georgiann Hahn gap 09/17/2017 8  5 - 15 Final   Performed at [facility], [address], [place], [place] [address]  . LDH 09/17/2017 143  98 - 192 U/L Final   Performed at [facility], [address] [place], [place] [address]     Pathology Orders Placed This Encounter  Procedures  . MM Digital Screening    Standing Status:   Future    Standing  Expiration Date:   09/17/2018    Order Specific Question:   Reason for Exam (SYMPTOM  OR DIAGNOSIS REQUIRED)    Answer:   left breast cancer    Order Specific Question:   Preferred imaging location?    Answer:   [facility]  . CBC with Differential/Platelet    Standing Status:   Future    Standing Expiration Date:   09/18/2019  . Comprehensive metabolic panel    Standing Status:   Future    Standing Expiration Date:   09/18/2019  . Lactate dehydrogenase    Standing Status:   Future    Standing Expiration Date:   09/18/2019  . CBC with Differential/Platelet    Standing Status:   Future    Number of Occurrences:   1    Standing  Expiration Date:   09/18/2018  . Comprehensive metabolic panel    Standing Status:   Future    Number of Occurrences:   1    Standing Expiration Date:   09/18/2018  . Lactate dehydrogenase    Standing Status:   Future    Number of Occurrences:   1    Standing Expiration Date:   09/18/2018       Zoila Shutter MD

## 2017-09-25 ENCOUNTER — Other Ambulatory Visit: Payer: Self-pay | Admitting: Family Medicine

## 2017-10-01 ENCOUNTER — Other Ambulatory Visit: Payer: Self-pay | Admitting: Family Medicine

## 2017-10-14 ENCOUNTER — Other Ambulatory Visit: Payer: Self-pay | Admitting: Family Medicine

## 2017-10-14 ENCOUNTER — Ambulatory Visit (HOSPITAL_COMMUNITY)
Admission: RE | Admit: 2017-10-14 | Discharge: 2017-10-14 | Disposition: A | Discharge status: Home / Self Care | Payer: Medicare Other | Source: Ambulatory Visit | Attending: Family Medicine | Admitting: Family Medicine

## 2017-10-14 DIAGNOSIS — I2584 Coronary atherosclerosis due to calcified coronary lesion: Secondary | ICD-10-CM

## 2017-10-14 DIAGNOSIS — F17200 Nicotine dependence, unspecified, uncomplicated: Secondary | ICD-10-CM

## 2017-10-14 DIAGNOSIS — Z87891 Personal history of nicotine dependence: Secondary | ICD-10-CM | POA: Diagnosis not present

## 2017-10-14 DIAGNOSIS — I2510 Atherosclerotic heart disease of native coronary artery without angina pectoris: Secondary | ICD-10-CM | POA: Diagnosis not present

## 2017-10-14 DIAGNOSIS — I700 Atherosclerosis of aorta: Secondary | ICD-10-CM | POA: Diagnosis not present

## 2017-10-14 DIAGNOSIS — F17218 Nicotine dependence, cigarettes, with other nicotine-induced disorders: Secondary | ICD-10-CM

## 2017-10-14 DIAGNOSIS — J439 Emphysema, unspecified: Secondary | ICD-10-CM | POA: Insufficient documentation

## 2017-10-14 DIAGNOSIS — R9389 Abnormal findings on diagnostic imaging of other specified body structures: Secondary | ICD-10-CM

## 2017-10-15 ENCOUNTER — Telehealth: Payer: Self-pay

## 2017-10-15 DIAGNOSIS — E785 Hyperlipidemia, unspecified: Secondary | ICD-10-CM

## 2017-10-15 DIAGNOSIS — I10 Essential (primary) hypertension: Secondary | ICD-10-CM

## 2017-10-15 DIAGNOSIS — M8580 Other specified disorders of bone density and structure, unspecified site: Secondary | ICD-10-CM

## 2017-10-15 NOTE — Telephone Encounter (Signed)
-----  Message from [name], MD sent at 10/14/2017  7:50 PM EDT ----- pls let pt know lung scan shows emphysema , and will need to be repeated in 1 year. Needs to STOP smoking  totally if she is still smoking  Concerned about calcification of her arteries, she needs OV  NEXT 1 to 2 weeks with EKG , and she needs to bring her meds she is taking for visual review and will need fasting lipid, cmp and EGFr and vit D level in 5 to 7days before visit, I will be referreing her to cardiology also, and have netered referral to [facility] office

## 2017-10-16 ENCOUNTER — Other Ambulatory Visit: Payer: Self-pay | Admitting: Family Medicine

## 2017-10-18 DIAGNOSIS — M8580 Other specified disorders of bone density and structure, unspecified site: Secondary | ICD-10-CM | POA: Diagnosis not present

## 2017-10-18 DIAGNOSIS — I10 Essential (primary) hypertension: Secondary | ICD-10-CM | POA: Diagnosis not present

## 2017-10-18 DIAGNOSIS — E559 Vitamin D deficiency, unspecified: Secondary | ICD-10-CM | POA: Diagnosis not present

## 2017-10-18 DIAGNOSIS — E785 Hyperlipidemia, unspecified: Secondary | ICD-10-CM | POA: Diagnosis not present

## 2017-10-19 LAB — COMPLETE METABOLIC PANEL WITH GFR
AG RATIO: 1.8 (calc) (ref 1.0–2.5)
ALT: 9 U/L (ref 6–29)
AST: 12 U/L (ref 10–35)
Albumin: 4.5 g/dL (ref 3.6–5.1)
Alkaline phosphatase (APISO): 68 U/L (ref 33–130)
BUN: 17 mg/dL (ref 7–25)
CALCIUM: 9.6 mg/dL (ref 8.6–10.4)
CO2: 28 mmol/L (ref 20–32)
Chloride: 106 mmol/L (ref 98–110)
Creat: 0.67 mg/dL (ref 0.60–0.93)
GFR, EST NON AFRICAN AMERICAN: 88 mL/min/{1.73_m2} (ref >=60)
GFR, Est African American: 102 mL/min/{1.73_m2} (ref >=60)
GLOBULIN: 2.5 g/dL (ref 1.9–3.7)
Glucose, Bld: 89 mg/dL (ref 65–99)
POTASSIUM: 4.6 mmol/L (ref 3.5–5.3)
SODIUM: 142 mmol/L (ref 135–146)
Total Bilirubin: 0.6 mg/dL (ref 0.2–1.2)
Total Protein: 7 g/dL (ref 6.1–8.1)

## 2017-10-19 LAB — LIPID PANEL
CHOL/HDL RATIO: 2.6 (calc) (ref <5.0)
CHOLESTEROL: 189 mg/dL (ref <200)
HDL: 74 mg/dL (ref >50)
LDL Cholesterol (Calc): 96 mg/dL (calc)
Non-HDL Cholesterol (Calc): 115 mg/dL (calc) (ref <130)
TRIGLYCERIDES: 92 mg/dL (ref <150)

## 2017-10-19 LAB — VITAMIN D 25 HYDROXY (VIT D DEFICIENCY, FRACTURES): Vit D, 25-Hydroxy: 34 ng/mL (ref 30–100)

## 2017-10-22 ENCOUNTER — Ambulatory Visit (INDEPENDENT_AMBULATORY_CARE_PROVIDER_SITE_OTHER): Payer: Medicare Other | Admitting: Family Medicine

## 2017-10-22 ENCOUNTER — Other Ambulatory Visit: Payer: Self-pay | Admitting: Family Medicine

## 2017-10-22 ENCOUNTER — Other Ambulatory Visit: Payer: Self-pay

## 2017-10-22 VITALS — BP 130/80 | Temp 98.8°F | Resp 14 | Ht 64.0 in | Wt 161.0 lb

## 2017-10-22 DIAGNOSIS — I10 Essential (primary) hypertension: Secondary | ICD-10-CM

## 2017-10-22 DIAGNOSIS — I2510 Atherosclerotic heart disease of native coronary artery without angina pectoris: Secondary | ICD-10-CM | POA: Diagnosis not present

## 2017-10-22 DIAGNOSIS — J209 Acute bronchitis, unspecified: Secondary | ICD-10-CM

## 2017-10-22 DIAGNOSIS — E1169 Type 2 diabetes mellitus with other specified complication: Secondary | ICD-10-CM

## 2017-10-22 DIAGNOSIS — F17200 Nicotine dependence, unspecified, uncomplicated: Secondary | ICD-10-CM

## 2017-10-22 DIAGNOSIS — F17210 Nicotine dependence, cigarettes, uncomplicated: Secondary | ICD-10-CM

## 2017-10-22 DIAGNOSIS — E119 Type 2 diabetes mellitus without complications: Secondary | ICD-10-CM

## 2017-10-22 DIAGNOSIS — J0100 Acute maxillary sinusitis, unspecified: Secondary | ICD-10-CM | POA: Diagnosis not present

## 2017-10-22 DIAGNOSIS — I77819 Aortic ectasia, unspecified site: Secondary | ICD-10-CM

## 2017-10-22 DIAGNOSIS — E669 Obesity, unspecified: Secondary | ICD-10-CM

## 2017-10-22 MED ORDER — POTASSIUM CHLORIDE CRYS ER 20 MEQ PO TBCR: 20.0000 meq | EXTENDED_RELEASE_TABLET | Freq: Every day | ORAL | 3 refills | Status: DC

## 2017-10-22 MED ORDER — BUDESONIDE-FORMOTEROL FUMARATE 160-4.5 MCG/ACT IN AERO: 2.0000 | INHALATION_SPRAY | Freq: Two times a day (BID) | RESPIRATORY_TRACT | 11 refills | Status: DC

## 2017-10-22 MED ORDER — SIMVASTATIN 20 MG PO TABS: 20.0000 mg | ORAL_TABLET | Freq: Every day | ORAL | 3 refills | Status: DC

## 2017-10-22 MED ORDER — MONTELUKAST SODIUM 10 MG PO TABS: 10.0000 mg | ORAL_TABLET | Freq: Every day | ORAL | 3 refills | Status: DC

## 2017-10-22 MED ORDER — DOXYCYCLINE HYCLATE 100 MG PO TABS: 100.0000 mg | ORAL_TABLET | Freq: Two times a day (BID) | ORAL | 0 refills | Status: DC

## 2017-10-22 MED ORDER — BENZONATATE 100 MG PO CAPS: 100.0000 mg | ORAL_CAPSULE | Freq: Two times a day (BID) | ORAL | 0 refills | Status: DC | PRN

## 2017-10-22 NOTE — Patient Instructions (Addendum)
Annual physical exam with MD and wellness visits already scheduled , call if you need me before  You are treated for acute sinusitis and bronchitis  EKG today looks very good, but I still recommend cardiology evaluation  Please work on quitting smoking, call [place] for 24/7 help  Thank you  for choosing [facility]. We consider it a privelige to serve you.  Delivering excellent health care in a caring and  compassionate way is our goal.  Partnering with you,  so that together we can achieve this goal is our strategy.    Steps to Quit Smoking Smoking tobacco can be harmful to your health and can affect almost every organ in your body. Smoking puts you, and those around you, at risk for developing many serious chronic diseases. Quitting smoking is difficult, but it is one of the best things that you can do for your health. It is never too late to quit. What are the benefits of quitting smoking? When you quit smoking, you lower your risk of developing serious diseases and conditions, such as:  Lung cancer or lung disease, such as COPD.  Heart disease.  Stroke.  Heart attack.  Infertility.  Osteoporosis and bone fractures.  Additionally, symptoms such as coughing, wheezing, and shortness of breath may get better when you quit. You may also find that you get sick less often because your body is stronger at fighting off colds and infections. If you are pregnant, quitting smoking can help to reduce your chances of having a baby of low birth weight. How do I get ready to quit? When you decide to quit smoking, create a plan to make sure that you are successful. Before you quit:  Pick a date to quit. Set a date within the next two weeks to give you time to prepare.  Write down the reasons why you are quitting. Keep this list in places where you will see it often, such as on your bathroom mirror or in your car or wallet.  Identify the people, places, things, and  activities that make you want to smoke (triggers) and avoid them. Make sure to take these actions: ? Throw away all cigarettes at home, at work, and in your car. ? Throw away smoking accessories, such as Scientist, research (medical). ? Clean your car and make sure to empty the ashtray. ? Clean your home, including curtains and carpets.  Tell your family, friends, and coworkers that you are quitting. Support from your loved ones can make quitting easier.  Talk with your health care provider about your options for quitting smoking.  Find out what treatment options are covered by your health insurance.  What strategies can I use to quit smoking? Talk with your healthcare provider about different strategies to quit smoking. Some strategies include:  Quitting smoking altogether instead of gradually lessening how much you smoke over a period of time. Research shows that quitting "cold [place]" is more successful than gradually quitting.  Attending in-person counseling to help you build problem-solving skills. You are more likely to have success in quitting if you attend several counseling sessions. Even short sessions of 10 minutes can be effective.  Finding resources and support systems that can help you to quit smoking and remain smoke-free after you quit. These resources are most helpful when you use them often. They can include: ? Online chats with a Social worker. ? Telephone quitlines. ? Careers information officer. ? Support groups or group counseling. ? Text messaging programs. ?  Mobile phone applications.  Taking medicines to help you quit smoking. (If you are pregnant or breastfeeding, talk with your health care provider first.) Some medicines contain nicotine and some do not. Both types of medicines help with cravings, but the medicines that include nicotine help to relieve withdrawal symptoms. Your health care provider may recommend: ? Nicotine patches, gum, or lozenges. ? Nicotine inhalers or  sprays. ? Non-nicotine medicine that is taken by mouth.  Talk with your health care provider about combining strategies, such as taking medicines while you are also receiving in-person counseling. Using these two strategies together makes you more likely to succeed in quitting than if you used either strategy on its own. If you are pregnant or breastfeeding, talk with your health care provider about finding counseling or other support strategies to quit smoking. Do not take medicine to help you quit smoking unless told to do so by your health care provider. What things can I do to make it easier to quit? Quitting smoking might feel overwhelming at first, but there is a lot that you can do to make it easier. Take these important actions:  Reach out to your family and friends and ask that they support and encourage you during this time. Call telephone quitlines, reach out to support groups, or work with a counselor for support.  Ask people who smoke to avoid smoking around you.  Avoid places that trigger you to smoke, such as bars, parties, or smoke-break areas at work.  Spend time around people who do not smoke.  Lessen stress in your life, because stress can be a smoking trigger for some people. To lessen stress, try: ? Exercising regularly. ? Deep-breathing exercises. ? Yoga. ? Meditating. ? Performing a body scan. This involves closing your eyes, scanning your body from head to toe, and noticing which parts of your body are particularly tense. Purposefully relax the muscles in those areas.  Download or purchase mobile phone or tablet apps (applications) that can help you stick to your quit plan by providing reminders, tips, and encouragement. There are many free apps, such as QuitGuide from the [employer] Office manager for Disease Control and Prevention). You can find other support for quitting smoking (smoking cessation) through smokefree.gov and other websites.  How will I feel when I quit  smoking? Within the first 24 hours of quitting smoking, you may start to feel some withdrawal symptoms. These symptoms are usually most noticeable 2-3 days after quitting, but they usually do not last beyond 2-3 weeks. Changes or symptoms that you might experience include:  Mood swings.  Restlessness, anxiety, or irritation.  Difficulty concentrating.  Dizziness.  Strong cravings for sugary foods in addition to nicotine.  Mild weight gain.  Constipation.  Nausea.  Coughing or a sore throat.  Changes in how your medicines work in your body.  A depressed mood.  Difficulty sleeping (insomnia).  After the first 2-3 weeks of quitting, you may start to notice more positive results, such as:  Improved sense of smell and taste.  Decreased coughing and sore throat.  Slower heart rate.  Lower blood pressure.  Clearer skin.  The ability to breathe more easily.  Fewer sick days.  Quitting smoking is very challenging for most people. Do not get discouraged if you are not successful the first time. Some people need to make many attempts to quit before they achieve long-term success. Do your best to stick to your quit plan, and talk with your health care provider if you  have any questions or concerns. This information is not intended to replace advice given to you by your health care provider. Make sure you discuss any questions you have with your health care provider. Document Released: 12/26/2000 Document Revised: 08/30/2015 Document Reviewed: 05/18/2014 Elsevier Interactive Patient Education  [employer].

## 2017-10-24 ENCOUNTER — Other Ambulatory Visit: Payer: Self-pay | Admitting: Family Medicine

## 2017-11-04 ENCOUNTER — Telehealth: Payer: Self-pay | Admitting: Family Medicine

## 2017-11-04 ENCOUNTER — Telehealth: Payer: Self-pay | Admitting: Orthopaedic Surgery

## 2017-11-04 NOTE — Telephone Encounter (Signed)
Spoke with pt and let her know that since Dr. Luna Glasgow hasn't written that rx for her since 12/05/15 that he would have to see her again to determine if he's willing to give it. Pt verbalized understanding, and has an appointment with Dr. Luna Glasgow 11/28/17.

## 2017-11-04 NOTE — Telephone Encounter (Signed)
Patient called - states she was advised by primary care, Dr Moshe Cipro, that Dr Luna Glasgow was to do the refill for medication; said Dr Moshe Cipro did it in past when Dr Luna Glasgow was out of office for extended time:  tiZANidine (ZANAFLEX) 4 MG tablet 45 tablet 0 09/26/2017    Sig: TAKE ONE TABLET EVERY 8 HOURS AS NEEDED FOR MUSCLE SPASMS.   Pharmacy:  [facility]  Please advise.

## 2017-11-04 NOTE — Telephone Encounter (Signed)
Pt is calling questioning medicine--please call

## 2017-11-05 NOTE — Telephone Encounter (Signed)
Pt already called [place] office and received clarification on tizandine request

## 2017-11-07 ENCOUNTER — Ambulatory Visit (INDEPENDENT_AMBULATORY_CARE_PROVIDER_SITE_OTHER): Payer: Medicare Other | Admitting: Gastroenterology

## 2017-11-07 ENCOUNTER — Encounter: Payer: Self-pay | Admitting: Gastroenterology

## 2017-11-07 DIAGNOSIS — K921 Melena: Secondary | ICD-10-CM | POA: Diagnosis not present

## 2017-11-07 DIAGNOSIS — K5909 Other constipation: Secondary | ICD-10-CM

## 2017-11-07 DIAGNOSIS — D126 Benign neoplasm of colon, unspecified: Secondary | ICD-10-CM | POA: Diagnosis not present

## 2017-11-07 DIAGNOSIS — R140 Abdominal distension (gaseous): Secondary | ICD-10-CM | POA: Diagnosis not present

## 2017-11-07 NOTE — Assessment & Plan Note (Signed)
SYMPTOMS FAIRLY WELL CONTROLLED ON MIRALAX.  USE MIRALAX ONCE OR TWICE A DAY TO PREVENT CONSTIPATION. IT CAN CAUSE BLOATING AND GAS. FOLLOW UP AS NEEDED. PLEASE CALL WITH QUESTIONS OR CONCERNS.

## 2017-11-07 NOTE — Progress Notes (Signed)
Subjective:    Patient ID: [name], female    DOB: 11-25-1945, 72 y.o.   MRN: [id number] Tanya Helper, MD  HPI Has a new dog that is rambunctious and bites her/scratches her if he doesn't want to be held in a lap.APPETITE: GOOD. BMs: OK. NO ASPIRIN, BC/GOODY POWDERS, IBUPROFEN/MOTRIN, OR NAPROXEN/ALEVE. A LITTLE DIARRHEA YESTERDAY. TODAY FEELS GASSY/BLOATING. KEEPS GAS SINCE HAVING HER GB TAKEN OUT. GOT A LITTLE COUGH. UP EATING CHICKEN/ONIONSGARLIC IN THE MIDDLE OF THE NIGHT AND HAD DIARRHEA AFTERWARDS. DENTIST HAS HER NEW TEETH WAITING FOR HER. TRYING TO QUIT SMOKING.  PT DENIES FEVER, CHILLS, HEMATOCHEZIA, HEMATEMESIS, nausea, vomiting, melena, CHEST PAIN, SHORTNESS OF BREATH, CHANGE IN BOWEL IN HABITS, constipation, abdominal pain, problems swallowing, problems with sedation, OR heartburn or indigestion.  Past Medical History:  Diagnosis Date  . Breast cancer ([place]) 2007   Stage I (T1b N0 M0), grade 1 well-differentiated carcinoma of the left breast status, post lumpectomy followed by radiation therapy. Her estrogen receptor receptors were 93%, progesterone receptors 67%. HER-2/neu was negative. No lymphovascular space invasion was seen. All margins were clear. Ki-67 marker was low at 1% with surgery on 11/15/2004. Treated then with post-lumpectomy radiation, finish  . Breast cancer, left breast ([place]) 2007  . COPD (chronic obstructive pulmonary disease) ([place])   . Diabetes mellitus without complication ([place])   . Diverticula of colon   . Hyperlipidemia   . Hypertension   . Kidney stones   . Nicotine dependence   . Osteoarthritis    Past Surgical History:  Procedure Laterality Date  . ABDOMINAL HYSTERECTOMY    . BREAST SURGERY  2005 approx   left lumpectomy   . CHOLECYSTECTOMY N/A 05/26/2014     . COLECTOMY     2005, diverticulitis  . COLONOSCOPY N/A 05/20/2017     . DILATION AND CURETTAGE OF UTERUS    . ESOPHAGOGASTRODUODENOSCOPY N/A 05/20/2017     . left breast        cancer, in 2006  . TUBAL LIGATION      Allergies  Allergen Reactions  . Penicillins Hives, Shortness Of Breath and Swelling      . Sulfonamide Derivatives Other (See Comments)    Shaking all over, seizure like symptoms. Hospitalization resulted    Current Outpatient Medications  Medication Sig    . acetaminophen (TYLENOL) 500 MG tablet Take 500 mg by mouth every 6 (six) hours as needed for moderate pain.     Marland Kitchen albuterol (PROVENTIL) (2.5 MG/3ML) 0.083% nebulizer solution Take 2.5 mg by nebulization 3 (three) times daily as needed for wheezing or shortness of breath.    Marland Kitchen amLODipine (NORVASC) 5 MG tablet Take 1 tablet (5 mg total) by mouth daily.    . ATROVENT HFA 17 MCG/ACT inhaler INHALE 2 PUFFS BY MOUTH FOUR TIMES A DAY.    Marland Kitchen azelastine (ASTELIN) 0.1 % nasal spray PLACE 2 SPRAYS INTO EACH NOSTRIL TWICE DAILY.    Marland Kitchen Azelastine HCl 137 MCG/SPRAY SOLN PLACE 2 SPRAYS INTO EACH NOSTRIL TWICE DAILY.    . benazepril (LOTENSIN) 40 MG tablet TAKE ONE TABLET BY MOUTH DAILY.    . budesonide-formoterol (SYMBICORT) 160-4.5 MCG/ACT inhaler Inhale 2 puffs into the lungs 2 (two) times daily.    . Calcium Carbonate-Vitamin D (CALCIUM 600 + D PO) Take 1 tablet by mouth 2 (two) times daily. 600/400    . fluticasone (FLONASE) 50 MCG/ACT nasal spray Place 2 sprays into both nostrils daily.    Marland Kitchen loratadine (CLARITIN) 10  MG tablet TAKE 1 TABLET BY MOUTH ONCE DAILY FOR ALLERGIES. (Patient taking differently: Take 10 mg by mouth daily. )    . montelukast (SINGULAIR) 10 MG tablet Take 1 tablet (10 mg total) by mouth at bedtime.    . Omega-3 Fatty Acids (FISH OIL) 1000 MG CAPS Take 1 capsule by mouth 2 (two) times daily.     . polyethylene glycol powder (GLYCOLAX/MIRALAX) powder MIX 1 CAPFUL IN 8 OUNCES OF JUICE OR WATER AND DRINK ONCE DAILY.    Marland Kitchen potassium chloride SA (K-DUR,KLOR-CON) 20 MEQ tablet Take 1 tablet (20 mEq total) by mouth daily.    . simvastatin (ZOCOR) 20 MG tablet Take 1 tablet (20 mg total) by  mouth at bedtime.    . traMADol (ULTRAM) 50 MG tablet TAKE (1) TABLET BY MOUTH EVERY SIX HOURS AS NEEDED.    .      .      .      .       Review of Systems PER HPI OTHERWISE ALL SYSTEMS ARE NEGATIVE.    Objective:   Physical Exam  Constitutional: She is oriented to person, place, and time. She appears well-developed and well-nourished. No distress.  HENT:  Head: Normocephalic and atraumatic.  Mouth/Throat: Oropharynx is clear and moist. No oropharyngeal exudate.  POOR DENTITION  Eyes: Pupils are equal, round, and reactive to light. No scleral icterus.  Neck: Normal range of motion. Neck supple.  Cardiovascular: Normal rate, regular rhythm and normal heart sounds.  Pulmonary/Chest: Effort normal and breath sounds normal. No respiratory distress.  Abdominal: Soft. Bowel sounds are normal. She exhibits no distension. There is tenderness. There is no rebound and no guarding.  MILD TTP x4  Musculoskeletal: She exhibits no edema.  Lymphadenopathy:    She has no cervical adenopathy.  Neurological: She is alert and oriented to person, place, and time.  NO  NEW FOCAL DEFICITS  Psychiatric:  SLIGHTLY ANXIOUS MOOD, NL AFFECT  Vitals reviewed.     Assessment & Plan:

## 2017-11-07 NOTE — Patient Instructions (Addendum)
IF YOU CONSUME DAIRY, ADD LACTASE 3 PILLS WITH MEALS UP TO THREE TIMES A DAY.  ONLY USE ASPIRIN IF IT IS MEDICALLY NECESSARY.  USE MIRALAX ONCE OR TWICE A DAY TO PREVENT CONSTIPATION. IT CAN CAUSE BLOATING AND GAS.  FOLLOW UP AS NEEDED. PLEASE CALL WITH QUESTIONS OR CONCERNS.  NEXT COLONOSCOPY IN 3 YEARS.

## 2017-11-07 NOTE — Assessment & Plan Note (Signed)
FOUR REMOVED MAY 2019.  FOLLOW UP AS NEEDED. PLEASE CALL WITH QUESTIONS OR CONCERNS. NEXT COLONOSCOPY IN 3 YEARS.

## 2017-11-07 NOTE — Assessment & Plan Note (Signed)
SYMPTOMS CONTROLLED/RESOLVED AND Hb NL SEP 2019.  ONLY USE ASPIRIN IF IT IS MEDICALLY NECESSARY. FOLLOW UP AS NEEDED. PLEASE CALL WITH QUESTIONS OR CONCERNS.

## 2017-11-07 NOTE — Assessment & Plan Note (Signed)
LIKELY DUE TO MIRALAX AND DAIRY INTAKE.  IF YOU CONSUME DAIRY, ADD LACTASE 3 PILLS WITH MEALS UP TO THREE TIMES A DAY. USE MIRALAX ONCE OR TWICE A DAY TO PREVENT CONSTIPATION. IT CAN CAUSE BLOATING AND GAS. FOLLOW UP AS NEEDED. PLEASE CALL WITH QUESTIONS OR CONCERNS.

## 2017-11-08 NOTE — Progress Notes (Signed)
cc'ed to pcp °

## 2017-11-09 ENCOUNTER — Encounter: Payer: Self-pay | Admitting: Family Medicine

## 2017-11-09 DIAGNOSIS — E1169 Type 2 diabetes mellitus with other specified complication: Secondary | ICD-10-CM | POA: Insufficient documentation

## 2017-11-09 DIAGNOSIS — E669 Obesity, unspecified: Secondary | ICD-10-CM

## 2017-11-09 DIAGNOSIS — I2510 Atherosclerotic heart disease of native coronary artery without angina pectoris: Secondary | ICD-10-CM | POA: Insufficient documentation

## 2017-11-09 NOTE — Assessment & Plan Note (Signed)
Decongestant and antibiotic prescribed 

## 2017-11-09 NOTE — Assessment & Plan Note (Signed)
Controlled, no change in medication DASH diet and commitment to daily physical activity for a minimum of 30 minutes discussed and encouraged, as a part of hypertension management. The importance of attaining a healthy weight is also discussed.  BP/Weight 11/07/2017 10/22/2017 09/17/2017 09/09/2017 08/28/2017 06/11/2017 6/76/7209  Systolic BP 470 962 836 629 476 546 503  Diastolic BP 76 80 59 56 74 70 90  Wt. (Lbs) 160.4 161 160.6 158.12 155 158 160  BMI 27.53 27.64 27.57 27.14 26.61 27.12 27.46

## 2017-11-09 NOTE — Assessment & Plan Note (Signed)
Antibiotic course prescribed 

## 2017-11-09 NOTE — Assessment & Plan Note (Signed)
Multiple risk factors with abnormal CT scan. EKG: NSR, no LVH, no ischemia noted, still warrants cardiology evaluation which is arranged

## 2017-11-09 NOTE — Assessment & Plan Note (Signed)
Asked : confirms currently smoking cigarettes Assess: no quit date set, but cutting back Advise: needs to quit to reduce cancer risk, has had breast ca, and has lung nodules, laso CAD risk and CVA risk Assist; counseled for 5 mins and literature provided Arrange : f/u in 3 months

## 2017-11-09 NOTE — Progress Notes (Signed)
   [name]     MRN: [id number]      DOB: 12/29/1945   HPI [name] is here for follow up and re-evaluation of chronic medical conditions, medication management and review of any available recent lab and radiology data.  Preventive health is updated, specifically  Cancer screening and Immunization.   Recent chest scan documents significant CAD, and in light of her co morbidities, she is referred to cardiology for further evaluation 1 week h/o worsening head and chest congestion, associated with fever and chills intermittently. Nasal drainage has thickened , and is yellowish green, and at times bloody. Sputum is thick and yellow. C/o bilateral ear pressure, denies hearing loss and sore throat. Increasing fatigue , poor appetitie and sleep disturbed by cough. No improvement with OTC medication.    ROS  Denies chest pains, palpitations and leg swelling Denies abdominal pain, nausea, vomiting,diarrhea or constipation.   Denies dysuria, frequency, hesitancy or incontinence. Denies joint pain, swelling and limitation in mobility. Denies headaches, seizures, numbness, or tingling. Denies uncontrolled epression, anxiety or insomnia. Denies skin break down or rash.   PE  BP 130/80   Temp 98.8 F (37.1 C) (Oral)   Wt 161 lb (73 kg)   BMI 27.64 kg/m   Patient alert and oriented and in mild  cardiopulmonary distress.  HEENT: No facial asymmetry, EOMI,   oropharynx pink and moist.  Neck supple no JVD, no mass.maxillary sinus tenderness, TM clear, nasal congestion  Chest: decreased air entry, scattered crackles and wheezes bilaterally CVS: S1, S2 no murmurs, no S3.Regular rate.  ABD: Soft non tender.   Ext: No edema  MS: Adequate ROM spine, shoulders, hips and knees.  Skin: Intact, no ulcerations or rash noted.  Psych: Good eye contact, normal affect. Memory intact not anxious or depressed appearing.  CNS: CN 2-12 intact, power,  normal throughout.no focal deficits  noted.   Assessment & Plan  Acute sinusitis Antibiotic course prescribed  Acute bronchitis Decongestant and antibiotic prescribed  NICOTINE ADDICTION Asked : confirms currently smoking cigarettes Assess: no quit date set, but cutting back Advise: needs to quit to reduce cancer risk, has had breast ca, and has lung nodules, laso CAD risk and CVA risk Assist; counseled for 5 mins and literature provided Arrange : f/u in 3 months  Hypertension goal BP (blood pressure) < 130/80 Controlled, no change in medication DASH diet and commitment to daily physical activity for a minimum of 30 minutes discussed and encouraged, as a part of hypertension management. The importance of attaining a healthy weight is also discussed.  BP/Weight 11/07/2017 10/22/2017 09/17/2017 09/09/2017 08/28/2017 06/11/2017 07/01/735  Systolic BP 106 269 485 462 703 500 938  Diastolic BP 76 80 59 56 74 70 90  Wt. (Lbs) 160.4 161 160.6 158.12 155 158 160  BMI 27.53 27.64 27.57 27.14 26.61 27.12 27.46       Coronary artery calcification seen on CAT scan Multiple risk factors with abnormal CT scan. EKG: NSR, no LVH, no ischemia noted, still warrants cardiology evaluation which is arranged

## 2017-11-12 ENCOUNTER — Other Ambulatory Visit: Payer: Self-pay | Admitting: Family Medicine

## 2017-11-12 ENCOUNTER — Other Ambulatory Visit: Payer: Self-pay | Admitting: Orthopaedic Surgery

## 2017-11-18 ENCOUNTER — Ambulatory Visit: Payer: Medicare Other

## 2017-11-18 DIAGNOSIS — H2513 Age-related nuclear cataract, bilateral: Secondary | ICD-10-CM | POA: Diagnosis not present

## 2017-11-18 DIAGNOSIS — D3131 Benign neoplasm of right choroid: Secondary | ICD-10-CM | POA: Diagnosis not present

## 2017-11-25 ENCOUNTER — Ambulatory Visit (INDEPENDENT_AMBULATORY_CARE_PROVIDER_SITE_OTHER): Payer: Medicare Other

## 2017-11-25 ENCOUNTER — Other Ambulatory Visit: Payer: Self-pay | Admitting: Family Medicine

## 2017-11-25 VITALS — BP 125/70 | HR 88 | Resp 12 | Ht 64.0 in | Wt 161.0 lb

## 2017-11-25 DIAGNOSIS — Z0000 Encounter for general adult medical examination without abnormal findings: Secondary | ICD-10-CM | POA: Diagnosis not present

## 2017-11-25 MED ORDER — AZITHROMYCIN 250 MG PO TABS: ORAL_TABLET | ORAL | 0 refills | Status: DC

## 2017-11-25 NOTE — Progress Notes (Signed)
Subjective:   [name] is a 72 y.o. female who presents for Medicare Annual (Subsequent) preventive examination.  Review of Systems:   Cardiac Risk Factors include: hypertension;dyslipidemia;diabetes mellitus;smoking/ tobacco exposure;obesity (BMI >30kg/m2)     Objective:     Vitals: BP 125/70   Pulse 88   Resp 12   Ht '5\' 4"'$  (1.626 m)   Wt 161 lb (73 kg)   SpO2 97%   BMI 27.64 kg/m   Body mass index is 27.64 kg/m.  Advanced Directives 11/25/2017 09/17/2017 05/20/2017 04/10/2017 11/12/2016 11/12/2016 10/03/2016  Does Patient Have a Medical Advance Directive? Yes Yes No Yes No No Yes  Type of Advance Directive Living will [place];Living will - [place];Living will - - [place]  Does patient want to make changes to medical advance directive? No - Patient declined No - Patient declined - - No - Patient declined Yes (MAU/Ambulatory/Procedural Areas - Information given) -  Copy of [place] in Chart? - No - copy requested - No - copy requested - - No - copy requested  Would patient like information on creating a medical advance directive? - - No - Patient declined - No - Patient declined - No - Patient declined    Tobacco Social History   Tobacco Use  Smoking Status Current Every Day Smoker  . Packs/day: 1.00  . Years: 50.00  . Pack years: 50.00  . Types: Cigarettes  Smokeless Tobacco Never Used  Tobacco Comment   cigaretes  now 10 to 15 per day     Ready to quit: No Counseling given: Yes Comment: cigaretes  now 10 to 15 per day   Clinical Intake:  Pre-visit preparation completed: Yes  Pain : 0-10 Pain Score: 8  Pain Type: Acute pain Pain Location: Ear Pain Orientation: Left Pain Radiating Towards: left cheek, side of face  Pain Descriptors / Indicators: Aching Pain Onset: In the past 7 days Pain Frequency: Constant Pain Relieving Factors: tylenol   Pain Relieving Factors:  tylenol   BMI - recorded: 27.6 Nutritional Status: BMI 25 -29 Overweight Nutritional Risks: None Diabetes: No CBG done?: No Did pt. bring in CBG monitor from home?: No  How often do you need to have someone help you when you read instructions, pamphlets, or other written materials from your doctor or pharmacy?: 2 - Rarely What is the last grade level you completed in school?: 9th grade   Interpreter Needed?: No  Information entered by :: Francena Hanly LPN   Past Medical History:  Diagnosis Date  . Breast cancer ([place]) 2007   Stage I (T1b N0 M0), grade 1 well-differentiated carcinoma of the left breast status, post lumpectomy followed by radiation therapy. Her estrogen receptor receptors were 93%, progesterone receptors 67%. HER-2/neu was negative. No lymphovascular space invasion was seen. All margins were clear. Ki-67 marker was low at 1% with surgery on 11/15/2004. Treated then with post-lumpectomy radiation, finish  . Breast cancer, left breast ([place]) 2007  . COPD (chronic obstructive pulmonary disease) ([place])   . Diabetes mellitus without complication ([place])   . Diverticula of colon   . Hyperlipidemia   . Hypertension   . Kidney stones   . Nicotine dependence   . Osteoarthritis    Past Surgical History:  Procedure Laterality Date  . ABDOMINAL HYSTERECTOMY    . BREAST SURGERY  2005 approx   left lumpectomy   . CHOLECYSTECTOMY N/A 05/26/2014   Procedure: LAPAROSCOPIC CHOLECYSTECTOMY;  Surgeon: Aviva Signs Md, MD;  Location: [facility] ORS;  Service: General;  Laterality: N/A;  . COLECTOMY     2005, diverticulitis  . COLONOSCOPY N/A 05/20/2017   Procedure: COLONOSCOPY;  Surgeon: Danie Binder, MD;  Location: [facility]ITE;  Service: Endoscopy;  Laterality: N/A;  1:45pm  . DILATION AND CURETTAGE OF UTERUS    . ESOPHAGOGASTRODUODENOSCOPY N/A 05/20/2017   Procedure: ESOPHAGOGASTRODUODENOSCOPY (EGD);  Surgeon: Danie Binder, MD;  Location: [facility];  Service: Endoscopy;  Laterality:  N/A;  . left breast      cancer, in 2006  . TUBAL LIGATION     Family History  Problem Relation Age of Onset  . Breast cancer Mother   . COPD Father   . Emphysema Father   . Throat cancer Sister   . Glaucoma Brother   . Schizophrenia Brother   . Heart attack Brother   . Lung cancer Sister        former smoker  . Hepatitis C Daughter   . Seizures Daughter   . SIDS Son   . Colon cancer Neg Hx   . Colon polyps Neg Hx    Social History   Socioeconomic History  . Marital status: Single    Spouse name: Not on file  . Number of children: 5  . Years of education: 9th grade   . Highest education level: 9th grade  Occupational History  . Occupation: retired    Fish farm manager: RETIRED  Social Needs  . Financial resource strain: Not very hard  . Food insecurity:    Worry: Sometimes true    Inability: Sometimes true  . Transportation needs:    Medical: No    Non-medical: No  Tobacco Use  . Smoking status: Current Every Day Smoker    Packs/day: 1.00    Years: 50.00    Pack years: 50.00    Types: Cigarettes  . Smokeless tobacco: Never Used  . Tobacco comment: cigaretes  now 10 to 15 per day  Substance and Sexual Activity  . Alcohol use: No    Alcohol/week: 0.0 standard drinks  . Drug use: No  . Sexual activity: Not Currently  Lifestyle  . Physical activity:    Days per week: 7 days    Minutes per session: 30 min  . Stress: Only a little  Relationships  . Social connections:    Talks on phone: Twice a week    Gets together: Never    Attends religious service: Never    Active member of club or organization: No    Attends meetings of clubs or organizations: Never    Relationship status: Divorced  Other Topics Concern  . 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 QUIT WORKING 2003 AFTER CANCER DIAGNOSIS.      MARRIED WITH A RUN AWAY HUSBAND(FUGITIVE 10 YRS). 3 MISCARRIAGES, 5 CHILDREN    Outpatient  Encounter Medications as of 11/25/2017  Medication Sig  . acetaminophen (TYLENOL) 500 MG tablet Take 500 mg by mouth every 6 (six) hours as needed for moderate pain.   Marland Kitchen albuterol (PROVENTIL) (2.5 MG/3ML) 0.083% nebulizer solution Take 2.5 mg by nebulization 3 (three) times daily as needed for wheezing or shortness of breath.  Marland Kitchen amLODipine (NORVASC) 5 MG tablet Take 1 tablet (5 mg total) by mouth daily.  . ATROVENT HFA 17 MCG/ACT inhaler INHALE 2 PUFFS BY MOUTH FOUR TIMES A DAY.  Marland Kitchen azelastine (ASTELIN) 0.1 % nasal  spray PLACE 2 SPRAYS INTO EACH NOSTRIL TWICE DAILY.  Marland Kitchen Azelastine HCl 137 MCG/SPRAY SOLN PLACE 2 SPRAYS INTO EACH NOSTRIL TWICE DAILY.  Marland Kitchen azithromycin (ZITHROMAX Z-PAK) 250 MG tablet 2 tab po x 1 day then 1 pill daily for 4 days  . benazepril (LOTENSIN) 40 MG tablet TAKE ONE TABLET BY MOUTH DAILY.  . budesonide-formoterol (SYMBICORT) 160-4.5 MCG/ACT inhaler Inhale 2 puffs into the lungs 2 (two) times daily.  . Calcium Carbonate-Vitamin D (CALCIUM 600 + D PO) Take 1 tablet by mouth 2 (two) times daily. 600/400  . doxycycline (VIBRA-TABS) 100 MG tablet Take 1 tablet (100 mg total) by mouth 2 (two) times daily.  . fluticasone (FLONASE) 50 MCG/ACT nasal spray Place 2 sprays into both nostrils daily.  Marland Kitchen loratadine (CLARITIN) 10 MG tablet TAKE 1 TABLET BY MOUTH ONCE DAILY FOR ALLERGIES. (Patient taking differently: Take 10 mg by mouth daily. )  . montelukast (SINGULAIR) 10 MG tablet Take 1 tablet (10 mg total) by mouth at bedtime.  . Omega-3 Fatty Acids (FISH OIL) 1000 MG CAPS Take 1 capsule by mouth 2 (two) times daily.   . polyethylene glycol powder (GLYCOLAX/MIRALAX) powder MIX 1 CAPFUL IN 8 OUNCES OF JUICE OR WATER AND DRINK ONCE DAILY.  Marland Kitchen potassium chloride SA (K-DUR,KLOR-CON) 20 MEQ tablet Take 1 tablet (20 mEq total) by mouth daily.  . simvastatin (ZOCOR) 20 MG tablet Take 1 tablet (20 mg total) by mouth at bedtime.  Marland Kitchen tiZANidine (ZANAFLEX) 4 MG tablet TAKE ONE TABLET EVERY 8 HOURS AS  NEEDED FOR MUSCLE SPASMS.  Marland Kitchen tiZANidine (ZANAFLEX) 4 MG tablet TAKE ONE TABLET EVERY 8 HOURS AS NEEDED FOR MUSCLE SPASMS.  Marland Kitchen traMADol (ULTRAM) 50 MG tablet TAKE (1) TABLET BY MOUTH EVERY SIX HOURS AS NEEDED.   No facility-administered encounter medications on file as of 11/25/2017.     Activities of Daily Living In your present state of health, do you have any difficulty performing the following activities: 11/25/2017  Hearing? N  Vision? Y  Difficulty concentrating or making decisions? Y  Walking or climbing stairs? Y  Dressing or bathing? N  Doing errands, shopping? N  Preparing Food and eating ? N  Using the Toilet? N  In the past six months, have you accidently leaked urine? N  Do you have problems with loss of bowel control? N  Managing your Medications? N  Managing your Finances? N  Housekeeping or managing your Housekeeping? N  Some recent data might be hidden    Patient Care Team: [name], MD as PCP - General Sanjuana Kava, MD as Consulting Physician (Orthopedic Surgery) Patrici Ranks, MD (Inactive) as Consulting Physician (Hematology and Oncology) Danie Binder, MD as Consulting Physician (Gastroenterology)    Assessment:   This is a routine wellness examination for [name].  Exercise Activities and Dietary recommendations Current Exercise Habits: The patient does not participate in regular exercise at present, Exercise limited by: cardiac condition(s)  Goals    . Increase physical activity    . Quit smoking / using tobacco     Starting 01/04/2016 patient would like to cut back to 4 cigarettes a day.       Fall Risk Fall Risk  11/25/2017 10/22/2017 09/09/2017 06/11/2017 11/12/2016  Falls in the past year? 1 No No Yes Yes  Number falls in past yr: 0 - - 2 or more 1  Injury with Fall? 1 - - No No  Risk for fall due to : History of fall(s);Medication side effect;Impaired  vision - - - Impaired balance/gait;History of fall(s);Impaired mobility    Follow up Falls prevention discussed - - - Falls evaluation completed;Education provided;Falls prevention discussed   Is the patient's home free of loose throw rugs in walkways, pet beds, electrical cords, etc?   no      Grab bars in the bathroom? yes      Handrails on the stairs?   yes      Adequate lighting?   yes  Timed Get Up and Go performed: Patient able to perform in 6 seconds without assistance   Depression Screen PHQ 2/9 Scores 11/25/2017 09/09/2017 06/11/2017 11/12/2016  PHQ - 2 Score 2 0 0 0  PHQ- 9 Score 8 - - -     Cognitive Function     6CIT Screen 11/25/2017 11/12/2016 01/04/2016  What Year? 0 points 0 points 0 points  What month? 0 points 0 points 0 points  What time? 0 points 0 points 0 points  Count back from 20 0 points 0 points 0 points  Months in reverse 2 points 0 points 0 points  Repeat phrase 0 points 0 points 0 points  Total Score 2 0 0    Immunization History  Administered Date(s) Administered  . Influenza Split 10/31/2011  . Influenza Whole 11/18/2008, 10/12/2010  . Influenza,inj,Quad PF,6+ Mos 11/12/2012, 09/18/2013, 12/22/2014, 10/04/2015, 09/09/2017  . PPD Test 01/28/2012  . Pneumococcal Conjugate-13 01/04/2015  . Pneumococcal Polysaccharide-23 06/13/2010  . Tdap 01/28/2012    Qualifies for Shingles Vaccine?up to date   Screening Tests Health Maintenance  Topic Date Due  . OPHTHALMOLOGY EXAM  02/26/2020 (Originally 09/07/2015)  . FOOT EXAM  10/06/2020 (Originally 01/23/2017)  . MAMMOGRAM  03/07/2019  . TETANUS/TDAP  01/27/2022  . COLONOSCOPY  05/21/2027  . INFLUENZA VACCINE  Completed  . DEXA SCAN  Completed  . Hepatitis C Screening  Completed  . PNA vac Low Risk Adult  Completed  . HEMOGLOBIN A1C  Discontinued    Cancer Screenings: Lung: Low Dose CT Chest recommended if Age 60-80 years, 30 pack-year currently smoking OR have quit w/in 15years. Patient does qualify. Breast:  Up to date on Mammogram? Yes   Up to date of Bone  Density/Dexa? Yes Colorectal: up to date   Additional Screenings:  Hepatitis C Screening: N/A      Plan:   Continue to exercise, increase water intake, try to quit smoking    I have personally reviewed and noted the following in the patient's chart:   . Medical and social history . Use of alcohol, tobacco or illicit drugs  . Current medications and supplements . Functional ability and status . Nutritional status . Physical activity . Advanced directives . List of other physicians . Hospitalizations, surgeries, and ER visits in previous 12 months . Vitals . Screenings to include cognitive, depression, and falls . Referrals and appointments  In addition, I have reviewed and discussed with patient certain preventive protocols, quality metrics, and best practice recommendations. A written personalized care plan for preventive services as well as general preventive health recommendations were provided to patient.     Hayden Pedro, LPN  20/80/2233

## 2017-11-25 NOTE — Telephone Encounter (Signed)
I sent a  Z pack, tried to call her could not get her pls let her know

## 2017-11-25 NOTE — Patient Instructions (Signed)
[name] , Thank you for taking time to come for your Medicare Wellness Visit. I appreciate your ongoing commitment to your health goals. Please review the following plan we discussed and let me know if I can assist you in the future.   Screening recommendations/referrals: Colonoscopy: up to date  Mammogram: up to date  Bone Density: up to date  Recommended yearly ophthalmology/optometry visit for glaucoma screening and checkup Recommended yearly dental visit for hygiene and checkup  Vaccinations: Influenza vaccine: up to date  Pneumococcal vaccine: up to date  Tdap vaccine: up to date  Shingles vaccine: postponed     Advanced directives: in place   Conditions/risks identified: hypertension, COPD  Next appointment: Wellness in one year    Preventive Care 73 Years and Older, Female Preventive care refers to lifestyle choices and visits with your health care provider that can promote health and wellness. What does preventive care include?  A yearly physical exam. This is also called an annual well check.  Dental exams once or twice a year.  Routine eye exams. Ask your health care provider how often you should have your eyes checked.  Personal lifestyle choices, including:  Daily care of your teeth and gums.  Regular physical activity.  Eating a healthy diet.  Avoiding tobacco and drug use.  Limiting alcohol use.  Practicing safe sex.  Taking low-dose aspirin every day.  Taking vitamin and mineral supplements as recommended by your health care provider. What happens during an annual well check? The services and screenings done by your health care provider during your annual well check will depend on your age, overall health, lifestyle risk factors, and family history of disease. Counseling  Your health care provider may ask you questions about your:  Alcohol use.  Tobacco use.  Drug use.  Emotional well-being.  Home and relationship well-being.  Sexual  activity.  Eating habits.  History of falls.  Memory and ability to understand (cognition).  Work and work Statistician.  Reproductive health. Screening  You may have the following tests or measurements:  Height, weight, and BMI.  Blood pressure.  Lipid and cholesterol levels. These may be checked every 5 years, or more frequently if you are over 11 years old.  Skin check.  Lung cancer screening. You may have this screening every year starting at age 58 if you have a 30-pack-year history of smoking and currently smoke or have quit within the past 15 years.  Fecal occult blood test (FOBT) of the stool. You may have this test every year starting at age 82.  Flexible sigmoidoscopy or colonoscopy. You may have a sigmoidoscopy every 5 years or a colonoscopy every 10 years starting at age 6.  Hepatitis C blood test.  Hepatitis B blood test.  Sexually transmitted disease (STD) testing.  Diabetes screening. This is done by checking your blood sugar (glucose) after you have not eaten for a while (fasting). You may have this done every 1-3 years.  Bone density scan. This is done to screen for osteoporosis. You may have this done starting at age 28.  Mammogram. This may be done every 1-2 years. Talk to your health care provider about how often you should have regular mammograms. Talk with your health care provider about your test results, treatment options, and if necessary, the need for more tests. Vaccines  Your health care provider may recommend certain vaccines, such as:  Influenza vaccine. This is recommended every year.  Tetanus, diphtheria, and acellular pertussis (Tdap, Td) vaccine.  You may need a Td booster every 10 years.  Zoster vaccine. You may need this after age 56.  Pneumococcal 13-valent conjugate (PCV13) vaccine. One dose is recommended after age 28.  Pneumococcal polysaccharide (PPSV23) vaccine. One dose is recommended after age 58. Talk to your health care  provider about which screenings and vaccines you need and how often you need them. This information is not intended to replace advice given to you by your health care provider. Make sure you discuss any questions you have with your health care provider. Document Released: 01/28/2015 Document Revised: 09/21/2015 Document Reviewed: 11/02/2014 Elsevier Interactive Patient Education  2017 [place] Prevention in the Home Falls can cause injuries. They can happen to people of all ages. There are many things you can do to make your home safe and to help prevent falls. What can I do on the outside of my home?  Regularly fix the edges of walkways and driveways and fix any cracks.  Remove anything that might make you trip as you walk through a door, such as a raised step or threshold.  Trim any bushes or trees on the path to your home.  Use bright outdoor lighting.  Clear any walking paths of anything that might make someone trip, such as rocks or tools.  Regularly check to see if handrails are loose or broken. Make sure that both sides of any steps have handrails.  Any raised decks and porches should have guardrails on the edges.  Have any leaves, snow, or ice cleared regularly.  Use sand or salt on walking paths during winter.  Clean up any spills in your garage right away. This includes oil or grease spills. What can I do in the bathroom?  Use night lights.  Install grab bars by the toilet and in the tub and shower. Do not use towel bars as grab bars.  Use non-skid mats or decals in the tub or shower.  If you need to sit down in the shower, use a plastic, non-slip stool.  Keep the floor dry. Clean up any water that spills on the floor as soon as it happens.  Remove soap buildup in the tub or shower regularly.  Attach bath mats securely with double-sided non-slip rug tape.  Do not have throw rugs and other things on the floor that can make you trip. What can I do in  the bedroom?  Use night lights.  Make sure that you have a light by your bed that is easy to reach.  Do not use any sheets or blankets that are too big for your bed. They should not hang down onto the floor.  Have a firm chair that has side arms. You can use this for support while you get dressed.  Do not have throw rugs and other things on the floor that can make you trip. What can I do in the kitchen?  Clean up any spills right away.  Avoid walking on wet floors.  Keep items that you use a lot in easy-to-reach places.  If you need to reach something above you, use a strong step stool that has a grab bar.  Keep electrical cords out of the way.  Do not use floor polish or wax that makes floors slippery. If you must use wax, use non-skid floor wax.  Do not have throw rugs and other things on the floor that can make you trip. What can I do with my stairs?  Do not leave any  items on the stairs.  Make sure that there are handrails on both sides of the stairs and use them. Fix handrails that are broken or loose. Make sure that handrails are as long as the stairways.  Check any carpeting to make sure that it is firmly attached to the stairs. Fix any carpet that is loose or worn.  Avoid having throw rugs at the top or bottom of the stairs. If you do have throw rugs, attach them to the floor with carpet tape.  Make sure that you have a light switch at the top of the stairs and the bottom of the stairs. If you do not have them, ask someone to add them for you. What else can I do to help prevent falls?  Wear shoes that:  Do not have high heels.  Have rubber bottoms.  Are comfortable and fit you well.  Are closed at the toe. Do not wear sandals.  If you use a stepladder:  Make sure that it is fully opened. Do not climb a closed stepladder.  Make sure that both sides of the stepladder are locked into place.  Ask someone to hold it for you, if possible.  Clearly mark and  make sure that you can see:  Any grab bars or handrails.  First and last steps.  Where the edge of each step is.  Use tools that help you move around (mobility aids) if they are needed. These include:  Canes.  Walkers.  Scooters.  Crutches.  Turn on the lights when you go into a dark area. Replace any light bulbs as soon as they burn out.  Set up your furniture so you have a clear path. Avoid moving your furniture around.  If any of your floors are uneven, fix them.  If there are any pets around you, be aware of where they are.  Review your medicines with your doctor. Some medicines can make you feel dizzy. This can increase your chance of falling. Ask your doctor what other things that you can do to help prevent falls. This information is not intended to replace advice given to you by your health care provider. Make sure you discuss any questions you have with your health care provider. Document Released: 10/28/2008 Document Revised: 06/09/2015 Document Reviewed: 02/05/2014 Elsevier Interactive Patient Education  2017 [employer].

## 2017-11-25 NOTE — Telephone Encounter (Signed)
Patient here today 11-25-17 for an AWV, complaint of left ear pain for the past four days, productive cough, nasal congestion, no fever, chills, headache. States pain in left ear hurts so bad, it is hurting the left side of her face. Wants to know if you will call her in a Z-pack. Please advise

## 2017-11-26 NOTE — Telephone Encounter (Signed)
Called patient no answer left a message

## 2017-11-26 NOTE — Telephone Encounter (Signed)
Spoke with patient, pharmacy delivered medication

## 2017-11-28 ENCOUNTER — Encounter: Payer: Self-pay | Admitting: Orthopaedic Surgery

## 2017-11-28 ENCOUNTER — Ambulatory Visit (INDEPENDENT_AMBULATORY_CARE_PROVIDER_SITE_OTHER): Payer: Medicare Other | Admitting: Orthopaedic Surgery

## 2017-11-28 DIAGNOSIS — M7061 Trochanteric bursitis, right hip: Secondary | ICD-10-CM | POA: Diagnosis not present

## 2017-11-28 MED ORDER — TIZANIDINE HCL 4 MG PO TABS: ORAL_TABLET | ORAL | 3 refills | Status: DC

## 2017-11-28 NOTE — Progress Notes (Signed)
PROCEDURE NOTE:  The patient request injection, verbal consent was obtained.  The right trochanteric area of the hip was prepped appropriately after time out was performed.   Sterile technique was observed and injection of 1 cc of Depo-Medrol 40 mg with several cc's of plain xylocaine. Anesthesia was provided by ethyl chloride and a 20-gauge needle was used to inject the hip area. The injection was tolerated well.  A band aid dressing was applied.  The patient was advised to apply ice later today and tomorrow to the injection sight as needed.  Electronically Signed Sanjuana Kava, MD 11/14/20191:39 PM

## 2017-11-29 ENCOUNTER — Encounter: Payer: Self-pay | Admitting: Cardiology

## 2017-11-29 ENCOUNTER — Ambulatory Visit (INDEPENDENT_AMBULATORY_CARE_PROVIDER_SITE_OTHER): Payer: Medicare Other | Admitting: Cardiology

## 2017-11-29 VITALS — BP 126/68 | HR 84 | Ht 64.0 in | Wt 161.0 lb

## 2017-11-29 DIAGNOSIS — I2510 Atherosclerotic heart disease of native coronary artery without angina pectoris: Secondary | ICD-10-CM

## 2017-11-29 NOTE — Patient Instructions (Addendum)
Medication Instructions:  Start aspirin 81 mg daily  If you need a refill on your cardiac medications before your next appointment, please call your pharmacy.   Lab work: none If you have labs (blood work) drawn today and your tests are completely normal, you will receive your results only by: Marland Kitchen MyChart Message (if you have MyChart) OR . A paper copy in the mail If you have any lab test that is abnormal or we need to change your treatment, we will call you to review the results.  Testing/Procedures:Your physician has requested that you have en exercise stress myoview. For further information please visit [email or web address]. Please follow instruction sheet, as given.   Follow-Up: At [facility], you and your health needs are our priority.  As part of our continuing mission to provide you with exceptional heart care, we have created designated Provider Care Teams.  These Care Teams include your primary Cardiologist (physician) and Advanced Practice Providers (APPs -  Physician Assistants and Nurse Practitioners) who all work together to provide you with the care you need, when you need it. You will need a follow up appointment : to be determined after test.

## 2017-11-29 NOTE — Progress Notes (Signed)
   Clinical Summary [name] is a 72 y.o.female seen as new consult, referred by Dr Simpson for CAD.   1. CAD - noted on CT 09/2017, incidental findings. Multivessel and LM disease - can have some chest pain. Sharp pain left side, 8/10 in severity. Can occur any time. No SOB. Lasts a few seconds - had breast cancer in that area, lumpectomy - occasional DOE with activities  CAD risk factors: +smoker roughly 50 years, HL, HTN      Past Medical History:  Diagnosis Date  . Breast cancer (HCC) 2007   Stage I (T1b N0 M0), grade 1 well-differentiated carcinoma of the left breast status, post lumpectomy followed by radiation therapy. Her estrogen receptor receptors were 93%, progesterone receptors 67%. HER-2/neu was negative. No lymphovascular space invasion was seen. All margins were clear. Ki-67 marker was low at 1% with surgery on 11/15/2004. Treated then with post-lumpectomy radiation, finish  . Breast cancer, left breast (HCC) 2007  . COPD (chronic obstructive pulmonary disease) (HCC)   . Diabetes mellitus without complication (HCC)   . Diverticula of colon   . Hyperlipidemia   . Hypertension   . Kidney stones   . Nicotine dependence   . Osteoarthritis      Allergies  Allergen Reactions  . Penicillins Hives, Shortness Of Breath and Swelling    Has patient had a PCN reaction causing immediate rash, facial/tongue/throat swelling, SOB or lightheadedness with hypotension: yes Has patient had a PCN reaction causing severe rash involving mucus membranes or skin necrosis:no Has patient had a PCN reaction that required hospitalization: yes Has patient had a PCN reaction occurring within the last 10 years: no If all of the above answers are "NO", then may proceed with Cephalosporin use.  . Sulfonamide Derivatives Other (See Comments)    Shaking all over, seizure like symptoms. Hospitalization resulted      Current Outpatient Medications  Medication Sig Dispense Refill  .  acetaminophen (TYLENOL) 500 MG tablet Take 500 mg by mouth every 6 (six) hours as needed for moderate pain.     . albuterol (PROVENTIL) (2.5 MG/3ML) 0.083% nebulizer solution Take 2.5 mg by nebulization 3 (three) times daily as needed for wheezing or shortness of breath.    . amLODipine (NORVASC) 5 MG tablet Take 1 tablet (5 mg total) by mouth daily. 90 tablet 3  . ATROVENT HFA 17 MCG/ACT inhaler INHALE 2 PUFFS BY MOUTH FOUR TIMES A DAY. 12.9 g 3  . azelastine (ASTELIN) 0.1 % nasal spray PLACE 2 SPRAYS INTO EACH NOSTRIL TWICE DAILY. 30 mL 5  . azithromycin (ZITHROMAX) 250 MG tablet Take two tablets on day one, then one tablet once daily for an additional 4 days 6 tablet 0  . benazepril (LOTENSIN) 40 MG tablet TAKE ONE TABLET BY MOUTH DAILY. 90 tablet 2  . budesonide-formoterol (SYMBICORT) 160-4.5 MCG/ACT inhaler Inhale 2 puffs into the lungs 2 (two) times daily. 1 Inhaler 11  . Calcium Carbonate-Vitamin D (CALCIUM 600 + D PO) Take 1 tablet by mouth 2 (two) times daily. 600/400    . fluticasone (FLONASE) 50 MCG/ACT nasal spray Place 2 sprays into both nostrils daily.    . loratadine (CLARITIN) 10 MG tablet TAKE 1 TABLET BY MOUTH ONCE DAILY FOR ALLERGIES. (Patient taking differently: Take 10 mg by mouth daily. ) 90 tablet 1  . montelukast (SINGULAIR) 10 MG tablet Take 1 tablet (10 mg total) by mouth at bedtime. 90 tablet 3  . Omega-3 Fatty Acids (FISH OIL) 1000   MG CAPS Take 1 capsule by mouth 2 (two) times daily.     . polyethylene glycol powder (GLYCOLAX/MIRALAX) powder MIX 1 CAPFUL IN 8 OUNCES OF JUICE OR WATER AND DRINK ONCE DAILY. 527 g 5  . potassium chloride SA (K-DUR,KLOR-CON) 20 MEQ tablet Take 1 tablet (20 mEq total) by mouth daily. 30 tablet 3  . simvastatin (ZOCOR) 20 MG tablet Take 1 tablet (20 mg total) by mouth at bedtime. 90 tablet 3  . tiZANidine (ZANAFLEX) 4 MG tablet One by mouth every night before bed as needed for spasm 30 tablet 3  . traMADol (ULTRAM) 50 MG tablet TAKE (1) TABLET  BY MOUTH EVERY SIX HOURS AS NEEDED. 60 tablet 1   No current facility-administered medications for this visit.      Past Surgical History:  Procedure Laterality Date  . ABDOMINAL HYSTERECTOMY    . BREAST SURGERY  2005 approx   left lumpectomy   . CHOLECYSTECTOMY N/A 05/26/2014   Procedure: LAPAROSCOPIC CHOLECYSTECTOMY;  Surgeon: Mark Jenkins Md, MD;  Location: [facility];  Service: General;  Laterality: N/A;  . COLECTOMY     2005, diverticulitis  . COLONOSCOPY N/A 05/20/2017   Procedure: COLONOSCOPY;  Surgeon: Fields, Sandi L, MD;  Location: [facility];  Service: Endoscopy;  Laterality: N/A;  1:45pm  . DILATION AND CURETTAGE OF UTERUS    . ESOPHAGOGASTRODUODENOSCOPY N/A 05/20/2017   Procedure: ESOPHAGOGASTRODUODENOSCOPY (EGD);  Surgeon: Fields, Sandi L, MD;  Location: [facility];  Service: Endoscopy;  Laterality: N/A;  . left breast      cancer, in 2006  . TUBAL LIGATION       Allergies  Allergen Reactions  . Penicillins Hives, Shortness Of Breath and Swelling    Has patient had a PCN reaction causing immediate rash, facial/tongue/throat swelling, SOB or lightheadedness with hypotension: yes Has patient had a PCN reaction causing severe rash involving mucus membranes or skin necrosis:no Has patient had a PCN reaction that required hospitalization: yes Has patient had a PCN reaction occurring within the last 10 years: no If all of the above answers are "NO", then may proceed with Cephalosporin use.  . Sulfonamide Derivatives Other (See Comments)    Shaking all over, seizure like symptoms. Hospitalization resulted       Family History  Problem Relation Age of Onset  . Breast cancer Mother   . COPD Father   . Emphysema Father   . Throat cancer Sister   . Glaucoma Brother   . Schizophrenia Brother   . Heart attack Brother   . Lung cancer Sister        former smoker  . Hepatitis C Daughter   . Seizures Daughter   . SIDS Son   . Colon cancer Neg Hx   . Colon polyps Neg  Hx      Social History Ms. Tates reports that she has been smoking cigarettes. She has a 50.00 pack-year smoking history. She has never used smokeless tobacco. Ms. Shadwick reports that she does not drink alcohol.   Review of Systems CONSTITUTIONAL: No weight loss, fever, chills, weakness or fatigue.  HEENT: Eyes: No visual loss, blurred vision, double vision or yellow sclerae.No hearing loss, sneezing, congestion, runny nose or sore throat.  SKIN: No rash or itching.  CARDIOVASCULAR: per hpi RESPIRATORY: No shortness of breath, cough or sputum.  GASTROINTESTINAL: No anorexia, nausea, vomiting or diarrhea. No abdominal pain or blood.  GENITOURINARY: No burning on urination, no polyuria NEUROLOGICAL: No headache, dizziness, syncope, paralysis, ataxia,   numbness or tingling in the extremities. No change in bowel or bladder control.  MUSCULOSKELETAL: No muscle, back pain, joint pain or stiffness.  LYMPHATICS: No enlarged nodes. No history of splenectomy.  PSYCHIATRIC: No history of depression or anxiety.  ENDOCRINOLOGIC: No reports of sweating, cold or heat intolerance. No polyuria or polydipsia.  Marland Kitchen   Physical Examination Vitals:   11/29/17 1322 11/29/17 1323  BP: 130/70 126/68  Pulse: 84   SpO2: 99%    Vitals:   11/29/17 1322  Weight: 161 lb (73 kg)  Height: 5' 4" (1.626 m)    Gen: resting comfortably, no acute distress HEENT: no scleral icterus, pupils equal round and reactive, no palptable cervical adenopathy,  CV: RRR, no m/r/g, no jvd Resp: Clear to auscultation bilaterally GI: abdomen is soft, non-tender, non-distended, normal bowel sounds, no hepatosplenomegaly MSK: extremities are warm, no edema.  Skin: warm, no rash Neuro:  no focal deficits Psych: appropriate affect   Diagnostic Studies  09/2017 CT Chest IMPRESSION: 1. Lung-RADS 2, benign appearance or behavior. Continue annual screening with low-dose chest CT without contrast in 12 months. 2. Aortic  Atherosclerosis (ICD10-I70.0) and Emphysema (ICD10-J43.9). 3. Multi vessel and left main coronary artery atherosclerotic calcifications.   Assessment and Plan  1. CAD - incidental findings on CT chest. Imaging suggets LM and multivessel disease. She has atypical chest pain at the site of her previous lumpectomy. - we will obtain an exercise nuclear stress test to functionally assess her coronary lesions - start ASA 21m daily.   F/u pending stress results      JArnoldo Lenis M.D.

## 2017-12-04 ENCOUNTER — Telehealth: Payer: Self-pay | Admitting: Cardiology

## 2017-12-04 NOTE — Telephone Encounter (Signed)
Pt wanting to know it she can have stress test done tomorrow. She was bitten by insect and is currently taking loratadine and singulair. Pt notified that she can still have stress test done.

## 2017-12-04 NOTE — Telephone Encounter (Signed)
PLEASE GIVE PT A CALL-- LVM WANTING TO SPEAK W/ A NURSE ABOUT HER ARM.

## 2017-12-05 ENCOUNTER — Encounter (HOSPITAL_COMMUNITY): Payer: Self-pay

## 2017-12-05 ENCOUNTER — Encounter (HOSPITAL_COMMUNITY)
Admission: RE | Admit: 2017-12-05 | Discharge: 2017-12-05 | Disposition: A | Discharge status: Home / Self Care | Payer: Medicare Other | Source: Ambulatory Visit | Attending: Cardiology | Admitting: Cardiology

## 2017-12-05 ENCOUNTER — Encounter (HOSPITAL_BASED_OUTPATIENT_CLINIC_OR_DEPARTMENT_OTHER)
Admission: RE | Admit: 2017-12-05 | Discharge: 2017-12-05 | Disposition: A | Discharge status: Home / Self Care | Payer: Medicare Other | Source: Ambulatory Visit | Attending: Cardiology | Admitting: Cardiology

## 2017-12-05 DIAGNOSIS — I2510 Atherosclerotic heart disease of native coronary artery without angina pectoris: Secondary | ICD-10-CM

## 2017-12-05 HISTORY — DX: Unspecified asthma, uncomplicated: J45.909

## 2017-12-05 LAB — NM MYOCAR MULTI W/SPECT W/WALL MOTION / EF
CHL CUP NUCLEAR SSS: 0
LV sys vol: 37 mL
LVDIAVOL: 90 mL (ref 46–106)
NUC STRESS TID: 0.96
Peak HR: 102 {beats}/min
RATE: 0.44
Rest HR: 77 {beats}/min
SDS: 0
SRS: 0

## 2017-12-05 MED ORDER — SODIUM CHLORIDE 0.9% FLUSH
INTRAVENOUS | Status: AC
  Administered 2017-12-05: 10 mL via INTRAVENOUS
  Filled 2017-12-05: qty 10

## 2017-12-05 MED ORDER — TECHNETIUM TC 99M TETROFOSMIN IV KIT
30.0000 | PACK | Freq: Once | INTRAVENOUS | Status: AC | PRN
  Administered 2017-12-05: 31 via INTRAVENOUS

## 2017-12-05 MED ORDER — TECHNETIUM TC 99M TETROFOSMIN IV KIT
10.0000 | PACK | Freq: Once | INTRAVENOUS | Status: AC | PRN
  Administered 2017-12-05: 10.2 via INTRAVENOUS

## 2017-12-05 MED ORDER — REGADENOSON 0.4 MG/5ML IV SOLN
INTRAVENOUS | Status: AC
  Administered 2017-12-05: 0.4 mg via INTRAVENOUS
  Filled 2017-12-05: qty 5

## 2017-12-10 ENCOUNTER — Other Ambulatory Visit: Payer: Self-pay

## 2017-12-10 ENCOUNTER — Encounter (HOSPITAL_COMMUNITY): Payer: Self-pay | Admitting: Emergency Medicine

## 2017-12-10 ENCOUNTER — Emergency Department (HOSPITAL_COMMUNITY)
Admission: EM | Admit: 2017-12-10 | Discharge: 2017-12-10 | Disposition: A | Discharge status: Home / Self Care | Payer: Medicare Other | Attending: Emergency Medicine | Admitting: Emergency Medicine

## 2017-12-10 ENCOUNTER — Emergency Department (HOSPITAL_COMMUNITY): Payer: Medicare Other

## 2017-12-10 DIAGNOSIS — M25571 Pain in right ankle and joints of right foot: Secondary | ICD-10-CM | POA: Diagnosis not present

## 2017-12-10 DIAGNOSIS — Z79899 Other long term (current) drug therapy: Secondary | ICD-10-CM | POA: Diagnosis not present

## 2017-12-10 DIAGNOSIS — Y999 Unspecified external cause status: Secondary | ICD-10-CM | POA: Diagnosis not present

## 2017-12-10 DIAGNOSIS — Y92009 Unspecified place in unspecified non-institutional (private) residence as the place of occurrence of the external cause: Secondary | ICD-10-CM | POA: Diagnosis not present

## 2017-12-10 DIAGNOSIS — I10 Essential (primary) hypertension: Secondary | ICD-10-CM | POA: Insufficient documentation

## 2017-12-10 DIAGNOSIS — S99911A Unspecified injury of right ankle, initial encounter: Secondary | ICD-10-CM | POA: Diagnosis not present

## 2017-12-10 DIAGNOSIS — Z7982 Long term (current) use of aspirin: Secondary | ICD-10-CM | POA: Insufficient documentation

## 2017-12-10 DIAGNOSIS — W010XXA Fall on same level from slipping, tripping and stumbling without subsequent striking against object, initial encounter: Secondary | ICD-10-CM | POA: Insufficient documentation

## 2017-12-10 DIAGNOSIS — J45909 Unspecified asthma, uncomplicated: Secondary | ICD-10-CM | POA: Diagnosis not present

## 2017-12-10 DIAGNOSIS — Z853 Personal history of malignant neoplasm of breast: Secondary | ICD-10-CM | POA: Diagnosis not present

## 2017-12-10 DIAGNOSIS — Y939 Activity, unspecified: Secondary | ICD-10-CM | POA: Diagnosis not present

## 2017-12-10 DIAGNOSIS — S99921A Unspecified injury of right foot, initial encounter: Secondary | ICD-10-CM | POA: Diagnosis not present

## 2017-12-10 DIAGNOSIS — E119 Type 2 diabetes mellitus without complications: Secondary | ICD-10-CM | POA: Diagnosis not present

## 2017-12-10 DIAGNOSIS — S9001XA Contusion of right ankle, initial encounter: Secondary | ICD-10-CM | POA: Diagnosis not present

## 2017-12-10 DIAGNOSIS — M79671 Pain in right foot: Secondary | ICD-10-CM | POA: Diagnosis not present

## 2017-12-10 DIAGNOSIS — S80211A Abrasion, right knee, initial encounter: Secondary | ICD-10-CM | POA: Diagnosis not present

## 2017-12-10 DIAGNOSIS — F17210 Nicotine dependence, cigarettes, uncomplicated: Secondary | ICD-10-CM | POA: Diagnosis not present

## 2017-12-10 DIAGNOSIS — W19XXXA Unspecified fall, initial encounter: Secondary | ICD-10-CM

## 2017-12-10 NOTE — ED Triage Notes (Addendum)
Patient states she tripped over the laundry basket this morning and fell. Patient has bandage over right knee and right thumb. Complaining of pain to head, right wrist, right knee, and right ankle. Patient ambulatory at triage. States she is unsure if she hit her head. Denies LOC.

## 2017-12-10 NOTE — ED Provider Notes (Signed)
[facility] EMERGENCY DEPARTMENT Provider Note   CSN: [id number] Arrival date & time: 12/10/17  1031     History   Chief Complaint Chief Complaint  Patient presents with  . Fall    HPI [name] is a 72 y.o. female.  Accidental trip and fall over a laundry basket this morning.  She now complains of pain in her right lateral ankle, right second toe and right knee.  She has full flexion and extension of the knee.  No head or neck trauma.  Severity of symptoms is mild.  Nothing makes symptoms better or worse     Past Medical History:  Diagnosis Date  . Asthma   . Breast cancer ([place]) 2007   Stage I (T1b N0 M0), grade 1 well-differentiated carcinoma of the left breast status, post lumpectomy followed by radiation therapy. Her estrogen receptor receptors were 93%, progesterone receptors 67%. HER-2/neu was negative. No lymphovascular space invasion was seen. All margins were clear. Ki-67 marker was low at 1% with surgery on 11/15/2004. Treated then with post-lumpectomy radiation, finish  . Breast cancer, left breast ([place]) 2007  . COPD (chronic obstructive pulmonary disease) ([place])   . Diabetes mellitus without complication ([place])   . Diverticula of colon   . Hyperlipidemia   . Hypertension   . Kidney stones   . Nicotine dependence   . Osteoarthritis     Patient Active Problem List   Diagnosis Date Noted  . Coronary artery calcification seen on CAT scan 11/09/2017  . Diabetes mellitus type 2 in obese ([place]) 11/09/2017  . Colon adenomas 11/07/2017  . Bloating symptom 11/07/2017  . Melena 04/04/2017  . Allergic rhinitis 01/04/2015  . 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[place]) 09/26/2014  . Hydrosalpinx 09/28/2013  . Need for immunization against influenza 09/17/2013  . Osteopenia 07/15/2013  . postmenopausal ovarian cyst 04/06/2013  . Pulmonary nodules 04/04/2012  . Breast cancer ([place]) 03/11/2012  . Acute sinusitis 05/08/2011  . Ganglion cyst 05/08/2011  . Acute bronchitis 02/18/2010  .  COPD (chronic obstructive pulmonary disease) with chronic bronchitis ([place]) 07/24/2009  . Hyperlipidemia LDL goal <100 03/09/2009  . CONSTIPATION, CHRONIC 03/09/2009  . NICOTINE ADDICTION 10/10/2008  . Hypertension goal BP (blood pressure) < 130/80 09/29/2008  . GENERALIZED OSTEOARTHROSIS UNSPECIFIED SITE 09/29/2008    Past Surgical History:  Procedure Laterality Date  . ABDOMINAL HYSTERECTOMY    . BREAST SURGERY  2005 approx   left lumpectomy   . CHOLECYSTECTOMY N/A 05/26/2014   Procedure: LAPAROSCOPIC CHOLECYSTECTOMY;  Surgeon: Aviva Signs Md, MD;  Location: [facility] ORS;  Service: General;  Laterality: N/A;  . COLECTOMY     2005, diverticulitis  . COLONOSCOPY N/A 05/20/2017   Procedure: COLONOSCOPY;  Surgeon: Danie Binder, MD;  Location: [facility] ENDO SUITE;  Service: Endoscopy;  Laterality: N/A;  1:45pm  . DILATION AND CURETTAGE OF UTERUS    . ESOPHAGOGASTRODUODENOSCOPY N/A 05/20/2017   Procedure: ESOPHAGOGASTRODUODENOSCOPY (EGD);  Surgeon: Danie Binder, MD;  Location: [facility] ENDO SUITE;  Service: Endoscopy;  Laterality: N/A;  . left breast      cancer, in 2006  . TUBAL LIGATION       OB History    Gravida  8   Para  5   Term  5   Preterm      AB  3   Living  2     SAB  3   TAB      Ectopic      Multiple      Live  Births               Home Medications    Prior to Admission medications   Medication Sig Start Date End Date Taking? Authorizing Provider  acetaminophen (TYLENOL) 325 MG tablet Take 650 mg by mouth every 6 (six) hours as needed.   Yes [provider]  amLODipine (NORVASC) 5 MG tablet Take 1 tablet (5 mg total) by mouth daily. 01/23/17  Yes [name], MD  aspirin EC 81 MG tablet Take 81 mg by mouth daily.   Yes [provider]  ATROVENT HFA 17 MCG/ACT inhaler INHALE 2 PUFFS BY MOUTH FOUR TIMES A DAY. 11/13/17  Yes [name], MD  azelastine (ASTELIN) 0.1 % nasal spray PLACE 2 SPRAYS INTO EACH NOSTRIL TWICE DAILY.  03/14/16  Yes [name], MD  benazepril (LOTENSIN) 40 MG tablet TAKE ONE TABLET BY MOUTH DAILY. 08/21/17  Yes [name], MD  budesonide-formoterol [facility]) 160-4.5 MCG/ACT inhaler Inhale 2 puffs into the lungs 2 (two) times daily. 10/22/17  Yes [name], MD  Calcium Carbonate-Vitamin D (CALCIUM 600 + D PO) Take 1 tablet by mouth 2 (two) times daily. 600/400   Yes [provider]  fluticasone (FLONASE) 50 MCG/ACT nasal spray Place 2 sprays into both nostrils daily.   Yes [provider]  loratadine (CLARITIN) 10 MG tablet TAKE 1 TABLET BY MOUTH ONCE DAILY FOR ALLERGIES. Patient taking differently: Take 10 mg by mouth daily.  11/28/16  Yes [name], MD  montelukast (SINGULAIR) 10 MG tablet Take 1 tablet (10 mg total) by mouth at bedtime. 10/22/17  Yes [name], MD  Omega-3 Fatty Acids (FISH OIL) 1000 MG CAPS Take 1 capsule by mouth 2 (two) times daily.    Yes [provider]  polyethylene glycol powder (GLYCOLAX/MIRALAX) powder MIX 1 CAPFUL IN 8 OUNCES OF JUICE OR WATER AND DRINK ONCE DAILY. 04/23/16  Yes [name], MD  potassium chloride SA (K-DUR,KLOR-CON) 20 MEQ tablet Take 1 tablet (20 mEq total) by mouth daily. 10/22/17  Yes [name], MD  simvastatin (ZOCOR) 20 MG tablet Take 1 tablet (20 mg total) by mouth at bedtime. 10/22/17  Yes [name], MD  tiZANidine (ZANAFLEX) 4 MG tablet One by mouth every night before bed as needed for spasm 11/28/17  Yes Sanjuana Kava, MD  traMADol (ULTRAM) 50 MG tablet TAKE (1) TABLET BY MOUTH EVERY SIX HOURS AS NEEDED. 11/12/17  Yes Sanjuana Kava, MD  albuterol (PROVENTIL) (2.5 MG/3ML) 0.083% nebulizer solution Take 2.5 mg by nebulization 3 (three) times daily as needed for wheezing or shortness of breath.    [provider]    Family History Family History  Problem Relation Age of Onset  . Breast cancer Mother   . COPD Father   . Emphysema Father    . Throat cancer Sister   . Glaucoma Brother   . Schizophrenia Brother   . Heart attack Brother   . Lung cancer Sister        former smoker  . Hepatitis C Daughter   . Seizures Daughter   . SIDS Son   . Colon cancer Neg Hx   . Colon polyps Neg Hx     Social History Social History   Tobacco Use  . Smoking status: Current Every Day Smoker    Packs/day: 1.00    Years: 50.00    Pack years: 50.00    Types: Cigarettes  . Smokeless tobacco:  Never Used  . Tobacco comment: cigaretes  now 10 to 15 per day  Substance Use Topics  . Alcohol use: No    Alcohol/week: 0.0 standard drinks  . Drug use: No     Allergies   Penicillins and Sulfonamide derivatives   Review of Systems Review of Systems  All other systems reviewed and are negative.    Physical Exam Updated Vital Signs BP (!) 155/71   Pulse 74   Temp 98 F (36.7 C) (Oral)   Resp 18   Ht 5' 4" (1.626 m)   Wt 73 kg   SpO2 98%   BMI 27.64 kg/m   Physical Exam  Constitutional: She is oriented to person, place, and time. She appears well-developed and well-nourished.  nad  HENT:  Head: Normocephalic and atraumatic.  Eyes: Conjunctivae are normal.  Neck: Neck supple.  Cardiovascular: Normal rate and regular rhythm.  Pulmonary/Chest: Effort normal and breath sounds normal.  Abdominal: Soft. Bowel sounds are normal.  Musculoskeletal:  Right lower extremity: Small abrasion on knee.  Full range of motion of knee.  Right ankle tender on lateral malleolus with ecchymosis.  Also tender on the second toe.  Neurological: She is alert and oriented to person, place, and time.  Skin: Skin is warm and dry.  Psychiatric: She has a normal mood and affect. Her behavior is normal.  Nursing note and vitals reviewed.    ED Treatments / Results  Labs (all labs ordered are listed, but only abnormal results are displayed) Labs Reviewed - No data to display  EKG None  Radiology Dg Ankle Complete Right  Result Date:  12/10/2017 CLINICAL DATA:  Right ankle pain following fall, initial encounter EXAM: RIGHT ANKLE - COMPLETE 3+ VIEW COMPARISON:  05/24/16 FINDINGS: Degenerative changes of the tarsal bones are seen. No acute fracture or dislocation is noted. No soft tissue abnormality is noted. IMPRESSION: No acute abnormality noted.  Mild degenerative change is seen. Electronically Signed   By: Inez Catalina M.D.   On: 12/10/2017 11:42   Dg Foot Complete Right  Result Date: 12/10/2017 CLINICAL DATA:  Right foot pain following fall, initial encounter EXAM: RIGHT FOOT COMPLETE - 3+ VIEW COMPARISON:  None. FINDINGS: Mild tarsal degenerative changes are noted. No acute fracture or dislocation is noted. No soft tissue abnormality is seen. IMPRESSION: No acute abnormality noted. Electronically Signed   By: Inez Catalina M.D.   On: 12/10/2017 11:46    Procedures Procedures (including critical care time)  Medications Ordered in ED Medications - No data to display   Initial Impression / Assessment and Plan / ED Course  I have reviewed the triage vital signs and the nursing notes.  Pertinent labs & imaging results that were available during my care of the patient were reviewed by me and considered in my medical decision making (see chart for details).     Accidental mechanical fall just prior to emergency visit.  Plain films of right ankle and right foot are negative.  Discussed with patient.  ASO applied  Final Clinical Impressions(s) / ED Diagnoses   Final diagnoses:  Fall, initial encounter  Acute right ankle pain  Right foot pain    ED Discharge Orders    None       Nat Christen, MD 12/10/17 [phone]

## 2017-12-16 ENCOUNTER — Telehealth: Payer: Self-pay | Admitting: *Deleted

## 2017-12-16 NOTE — Telephone Encounter (Signed)
Please advise 

## 2017-12-16 NOTE — Telephone Encounter (Signed)
Pt stated she had fallen on knee Friday and her knee is red and swollen. Wanted to know if an antibiotic could be called in.

## 2017-12-16 NOTE — Telephone Encounter (Signed)
Needs to have ortho,  S/p trauma,   eval the knee , she is a pt of Dr Luna Glasgow , pls refer for knee pain  no antibiotic ,that office will call her

## 2017-12-18 ENCOUNTER — Encounter: Payer: Self-pay | Admitting: Orthopaedic Surgery

## 2017-12-18 ENCOUNTER — Ambulatory Visit (INDEPENDENT_AMBULATORY_CARE_PROVIDER_SITE_OTHER): Payer: Medicare Other | Admitting: Orthopaedic Surgery

## 2017-12-18 VITALS — BP 143/79 | HR 87 | Ht 64.0 in | Wt 159.0 lb

## 2017-12-18 DIAGNOSIS — M25561 Pain in right knee: Secondary | ICD-10-CM | POA: Diagnosis not present

## 2017-12-18 DIAGNOSIS — L03115 Cellulitis of right lower limb: Secondary | ICD-10-CM | POA: Diagnosis not present

## 2017-12-18 MED ORDER — CIPROFLOXACIN HCL 500 MG PO TABS: 500.0000 mg | ORAL_TABLET | Freq: Two times a day (BID) | ORAL | 0 refills | Status: DC

## 2017-12-18 NOTE — Progress Notes (Signed)
Patient HQ:[name], female DOB:06/18/1945, 72 y.o. MWU:[id number]  Chief Complaint  Patient presents with  . Leg Pain    After fall 12/10/17 seen in ER    HPI  [name] is a 72 y.o. female who fell at home last Tuesday and hurt her right foot, right ankle and knee. She was seen in the ER. X-rays were done of the right ankle and foot and were negative.  She has bruising present.  Her right knee has developed some anterior swelling and redness consistent with early cellulitis.    I have reviewed the X-rays and ER report.   Body mass index is 27.29 kg/m.  ROS  Review of Systems  HENT: Negative for congestion.   Respiratory: Positive for shortness of breath. Negative for cough.   Cardiovascular: Negative for chest pain and leg swelling.  Endocrine: Positive for cold intolerance.  Musculoskeletal: Positive for arthralgias, gait problem, joint swelling and myalgias.  Allergic/Immunologic: Positive for environmental allergies.  All other systems reviewed and are negative.   All other systems reviewed and are negative.  The following is a summary of the past history medically, past history surgically, known current medicines, social history and family history.  This information is gathered electronically by the computer from prior information and documentation.  I review this each visit and have found including this information at this point in the chart is beneficial and informative.    Past Medical History:  Diagnosis Date  . Asthma   . Breast cancer ([place]) 2007   Stage I (T1b N0 M0), grade 1 well-differentiated carcinoma of the left breast status, post lumpectomy followed by radiation therapy. Her estrogen receptor receptors were 93%, progesterone receptors 67%. HER-2/neu was negative. No lymphovascular space invasion was seen. All margins were clear. Ki-67 marker was low at 1% with surgery on 11/15/2004. Treated then with post-lumpectomy radiation, finish  . Breast  cancer, left breast ([place]) 2007  . COPD (chronic obstructive pulmonary disease) ([place])   . Diabetes mellitus without complication ([place])   . Diverticula of colon   . Hyperlipidemia   . Hypertension   . Kidney stones   . Nicotine dependence   . Osteoarthritis     Past Surgical History:  Procedure Laterality Date  . ABDOMINAL HYSTERECTOMY    . BREAST SURGERY  2005 approx   left lumpectomy   . CHOLECYSTECTOMY N/A 05/26/2014   Procedure: LAPAROSCOPIC CHOLECYSTECTOMY;  Surgeon: Aviva Signs Md, MD;  Location: [facility] ORS;  Service: General;  Laterality: N/A;  . COLECTOMY     2005, diverticulitis  . COLONOSCOPY N/A 05/20/2017   Procedure: COLONOSCOPY;  Surgeon: Danie Binder, MD;  Location: [facility] ENDO SUITE;  Service: Endoscopy;  Laterality: N/A;  1:45pm  . DILATION AND CURETTAGE OF UTERUS    . ESOPHAGOGASTRODUODENOSCOPY N/A 05/20/2017   Procedure: ESOPHAGOGASTRODUODENOSCOPY (EGD);  Surgeon: Danie Binder, MD;  Location: [facility] ENDO SU[facility];  Service: Endoscopy;  Laterality: N/A;  . left breast      cancer, in 2006  . TUBAL LIGATION      Family History  Problem Relation Age of Onset  . Breast cancer Mother   . COPD Father   . Emphysema Father   . Throat cancer Sister   . Glaucoma Brother   . Schizophrenia Brother   . Heart attack Brother   . Lung cancer Sister        former smoker  . Hepatitis C Daughter   . Seizures Daughter   . SIDS Son   .  Colon cancer Neg Hx   . Colon polyps Neg Hx     Social History Social History   Tobacco Use  . Smoking status: Current Every Day Smoker    Packs/day: 1.00    Years: 50.00    Pack years: 50.00    Types: Cigarettes  . Smokeless tobacco: Never Used  . Tobacco comment: cigaretes  now 10 to 15 per day  Substance Use Topics  . Alcohol use: No    Alcohol/week: 0.0 standard drinks  . Drug use: No    Allergies  Allergen Reactions  . Penicillins Hives, Shortness Of Breath and Swelling    Has patient had a PCN reaction causing immediate rash,  facial/tongue/throat swelling, SOB or lightheadedness with hypotension: yes Has patient had a PCN reaction causing severe rash involving mucus membranes or skin necrosis:no Has patient had a PCN reaction that required hospitalization: yes Has patient had a PCN reaction occurring within the last 10 years: no If all of the above answers are "NO", then may proceed with Cephalosporin use.  . Sulfonamide Derivatives Other (See Comments)    Shaking all over, seizure like symptoms. Hospitalization resulted     Current Outpatient Medications  Medication Sig Dispense Refill  . acetaminophen (TYLENOL) 325 MG tablet Take 650 mg by mouth every 6 (six) hours as needed.    Marland Kitchen albuterol (PROVENTIL) (2.5 MG/3ML) 0.083% nebulizer solution Take 2.5 mg by nebulization 3 (three) times daily as needed for wheezing or shortness of breath.    Marland Kitchen amLODipine (NORVASC) 5 MG tablet Take 1 tablet (5 mg total) by mouth daily. 90 tablet 3  . aspirin EC 81 MG tablet Take 81 mg by mouth daily.    . ATROVENT HFA 17 MCG/ACT inhaler INHALE 2 PUFFS BY MOUTH FOUR TIMES A DAY. 12.9 g 3  . azelastine (ASTELIN) 0.1 % nasal spray PLACE 2 SPRAYS INTO EACH NOSTRIL TWICE DAILY. 30 mL 5  . benazepril (LOTENSIN) 40 MG tablet TAKE ONE TABLET BY MOUTH DAILY. 90 tablet 2  . budesonide-formoterol (SYMBICORT) 160-4.5 MCG/ACT inhaler Inhale 2 puffs into the lungs 2 (two) times daily. 1 Inhaler 11  . Calcium Carbonate-Vitamin D (CALCIUM 600 + D PO) Take 1 tablet by mouth 2 (two) times daily. 600/400    . ciprofloxacin (CIPRO) 500 MG tablet Take 1 tablet (500 mg total) by mouth 2 (two) times daily. 20 tablet 0  . fluticasone (FLONASE) 50 MCG/ACT nasal spray Place 2 sprays into both nostrils daily.    Marland Kitchen loratadine (CLARITIN) 10 MG tablet TAKE 1 TABLET BY MOUTH ONCE DAILY FOR ALLERGIES. (Patient taking differently: Take 10 mg by mouth daily. ) 90 tablet 1  . montelukast (SINGULAIR) 10 MG tablet Take 1 tablet (10 mg total) by mouth at bedtime. 90  tablet 3  . Omega-3 Fatty Acids (FISH OIL) 1000 MG CAPS Take 1 capsule by mouth 2 (two) times daily.     . polyethylene glycol powder (GLYCOLAX/MIRALAX) powder MIX 1 CAPFUL IN 8 OUNCES OF JUICE OR WATER AND DRINK ONCE DAILY. 527 g 5  . potassium chloride SA (K-DUR,KLOR-CON) 20 MEQ tablet Take 1 tablet (20 mEq total) by mouth daily. 30 tablet 3  . simvastatin (ZOCOR) 20 MG tablet Take 1 tablet (20 mg total) by mouth at bedtime. 90 tablet 3  . tiZANidine (ZANAFLEX) 4 MG tablet One by mouth every night before bed as needed for spasm 30 tablet 3  . traMADol (ULTRAM) 50 MG tablet TAKE (1) TABLET BY MOUTH EVERY SIX HOURS  AS NEEDED. 60 tablet 1   No current facility-administered medications for this visit.      Physical Exam  Blood pressure (!) 143/79, pulse 87, height _0  (1.626 m), weight 159 lb (72.1 kg).  Constitutional: overall normal hygiene, normal nutrition, well developed, normal grooming, normal body habitus. Assistive device:none  Musculoskeletal: gait and station Limp right, muscle tone and strength are normal, no tremors or atrophy is present.  .  Neurological: coordination overall normal.  Deep tendon reflex/nerve stretch intact.  Sensation normal.  Cranial nerves II-XII intact.   Skin:   Normal overall no scars, lesions, ulcers or rashes. No psoriasis.  Psychiatric: Alert and oriented x 3.  Recent memory intact, remote memory unclear.  Normal mood and affect. Well groomed.  Good eye contact.  Cardiovascular: overall no swelling, no varicosities, no edema bilaterally, normal temperatures of the legs and arms, no clubbing, cyanosis and good capillary refill.  Lymphatic: palpation is normal.  Right anterior knee over patella has bruising and some redness consistent with early cellulitis. ROM is full with crepitus.  The right anterior ankle has bruising present and full ROM.  NV intact.  All other systems reviewed and are negative   The patient has been educated about the  nature of the problem(s) and counseled on treatment options.  The patient appeared to understand what I have discussed and is in agreement with it.  Encounter Diagnoses  Name Primary?  . Acute pain of right knee Yes  . Cellulitis of right knee     PLAN Call if any problems.  Precautions discussed. I have called in Cipro.  Return to clinic 1 week   Electronically [place], MD 12/4/201910:45 AM

## 2017-12-19 ENCOUNTER — Encounter (HOSPITAL_COMMUNITY)
Admission: RE | Admit: 2017-12-19 | Discharge: 2017-12-19 | Disposition: A | Discharge status: Home / Self Care | Payer: Medicare Other | Source: Ambulatory Visit | Attending: Ophthalmology | Admitting: Ophthalmology

## 2017-12-19 NOTE — Pre-Procedure Instructions (Signed)
Patient called for arrival time for surgery. She states, " I dont think I am going to be able to have that done. I have an infection in my knee." I advised patient to call Dr Hunt's office and let them know so they can reschedule her surgery. She verbalized understanding.

## 2017-12-23 ENCOUNTER — Ambulatory Visit (HOSPITAL_COMMUNITY): Admission: RE | Admit: 2017-12-23 | Payer: Medicare Other | Source: Ambulatory Visit | Admitting: Ophthalmology

## 2017-12-23 ENCOUNTER — Encounter (HOSPITAL_COMMUNITY): Admission: RE | Payer: Self-pay | Source: Ambulatory Visit

## 2017-12-23 SURGERY — PHACOEMULSIFICATION, CATARACT, WITH IOL INSERTION
Anesthesia: Monitor Anesthesia Care | Site: Eye | Laterality: Left

## 2017-12-30 ENCOUNTER — Other Ambulatory Visit: Payer: Self-pay | Admitting: Family Medicine

## 2018-01-20 ENCOUNTER — Other Ambulatory Visit: Payer: Self-pay | Admitting: Orthopaedic Surgery

## 2018-01-24 ENCOUNTER — Other Ambulatory Visit: Payer: Self-pay | Admitting: Family Medicine

## 2018-01-24 DIAGNOSIS — I10 Essential (primary) hypertension: Secondary | ICD-10-CM | POA: Diagnosis not present

## 2018-01-24 DIAGNOSIS — E559 Vitamin D deficiency, unspecified: Secondary | ICD-10-CM | POA: Diagnosis not present

## 2018-01-24 DIAGNOSIS — E785 Hyperlipidemia, unspecified: Secondary | ICD-10-CM | POA: Diagnosis not present

## 2018-01-25 LAB — COMPLETE METABOLIC PANEL WITH GFR
AG RATIO: 1.5 (calc) (ref 1.0–2.5)
ALBUMIN MSPROF: 4.4 g/dL (ref 3.6–5.1)
ALT: 11 U/L (ref 6–29)
AST: 14 U/L (ref 10–35)
Alkaline phosphatase (APISO): 68 U/L (ref 33–130)
BILIRUBIN TOTAL: 0.5 mg/dL (ref 0.2–1.2)
BUN: 22 mg/dL (ref 7–25)
CHLORIDE: 105 mmol/L (ref 98–110)
CO2: 26 mmol/L (ref 20–32)
Calcium: 9.9 mg/dL (ref 8.6–10.4)
Creat: 0.74 mg/dL (ref 0.60–0.93)
GFR, EST AFRICAN AMERICAN: 93 mL/min/{1.73_m2} (ref >=60)
GFR, Est Non African American: 80 mL/min/{1.73_m2} (ref >=60)
GLOBULIN: 2.9 g/dL (ref 1.9–3.7)
Glucose, Bld: 95 mg/dL (ref 65–99)
POTASSIUM: 4.6 mmol/L (ref 3.5–5.3)
Sodium: 141 mmol/L (ref 135–146)
TOTAL PROTEIN: 7.3 g/dL (ref 6.1–8.1)

## 2018-01-25 LAB — LIPID PANEL
CHOLESTEROL: 188 mg/dL (ref <200)
HDL: 71 mg/dL (ref >50)
LDL Cholesterol (Calc): 94 mg/dL (calc)
Non-HDL Cholesterol (Calc): 117 mg/dL (calc) (ref <130)
TRIGLYCERIDES: 130 mg/dL (ref <150)
Total CHOL/HDL Ratio: 2.6 (calc) (ref <5.0)

## 2018-01-25 LAB — VITAMIN D 25 HYDROXY (VIT D DEFICIENCY, FRACTURES): Vit D, 25-Hydroxy: 34 ng/mL (ref 30–100)

## 2018-01-25 LAB — TSH: TSH: 1.49 mIU/L (ref 0.40–4.50)

## 2018-01-28 ENCOUNTER — Encounter: Payer: Medicare Other | Admitting: Family Medicine

## 2018-02-10 ENCOUNTER — Other Ambulatory Visit: Payer: Self-pay | Admitting: Family Medicine

## 2018-02-26 ENCOUNTER — Ambulatory Visit: Payer: Medicare Other | Admitting: Orthopaedic Surgery

## 2018-02-27 ENCOUNTER — Encounter: Payer: Self-pay | Admitting: Family Medicine

## 2018-02-27 ENCOUNTER — Ambulatory Visit (INDEPENDENT_AMBULATORY_CARE_PROVIDER_SITE_OTHER): Payer: Medicare Other | Admitting: Family Medicine

## 2018-02-27 VITALS — BP 130/70 | HR 100 | Resp 14 | Ht 64.0 in | Wt 169.4 lb

## 2018-02-27 DIAGNOSIS — I10 Essential (primary) hypertension: Secondary | ICD-10-CM

## 2018-02-27 DIAGNOSIS — Z0000 Encounter for general adult medical examination without abnormal findings: Secondary | ICD-10-CM | POA: Diagnosis not present

## 2018-02-27 DIAGNOSIS — J0100 Acute maxillary sinusitis, unspecified: Secondary | ICD-10-CM

## 2018-02-27 DIAGNOSIS — J209 Acute bronchitis, unspecified: Secondary | ICD-10-CM

## 2018-02-27 DIAGNOSIS — F17200 Nicotine dependence, unspecified, uncomplicated: Secondary | ICD-10-CM | POA: Diagnosis not present

## 2018-02-27 DIAGNOSIS — F17210 Nicotine dependence, cigarettes, uncomplicated: Secondary | ICD-10-CM

## 2018-02-27 DIAGNOSIS — E785 Hyperlipidemia, unspecified: Secondary | ICD-10-CM

## 2018-02-27 MED ORDER — AZITHROMYCIN 250 MG PO TABS: ORAL_TABLET | ORAL | 0 refills | Status: DC

## 2018-02-27 NOTE — Progress Notes (Signed)
    [name]     MRN: [id number]      DOB: 09-17-1945  HPI: Patient is in for annual physical exam. Head and chest congestion x 1 week, green , foul tasting post nasal drainage and sputum production, has had chills Currently smokes 10 /day trying to quit , unwilling to set a date Immunization is reviewed , and  updated if needed.   PE: BP 130/70   Pulse 100   Resp 14   Ht 5\' 4"  (1.626 m)   Wt 169 lb 6.4 oz (76.8 kg)   SpO2 95% Comment: room air  BMI 29.08 kg/m   Pleasant  female, alert and oriented x 3, in no cardio-pulmonary distress. Afebrile. HEENT No facial trauma or asymetry. Maxillary Sinuses  tender.  Extra occullar muscles intact, pupils equally reactive to light. External ears normal, tympanic membranes clear. Oropharynx moist, no exudate. Neck: supple, no adenopathy,JVD or thyromegaly.No bruits.  Chest: Decreased air entry, scattered crackles and wheezes Breast: asymetry,no masses or lumps. No tenderness. No nipple discharge or inversion. No axillary or supraclavicular adenopathy  Cardiovascular system; Heart sounds normal,  S1 and  S2 ,no S3.  No murmur, or thrill. Apical beat not displaced Peripheral pulses normal.  Abdomen: Soft, non tender, no organomegaly or masses. No bruits. Bowel sounds normal. No guarding, tenderness or rebound.     Musculoskeletal exam: Decreased  ROM of spine,  And right hip ,adequate in  shoulders and knees. No deformity ,swelling or crepitus noted. No muscle wasting or atrophy.   Neurologic: Cranial nerves 2 to 12 intact. Power, tone ,sensation and reflexes normal throughout. No disturbance in gait. Positive  tremor.  Skin: Intact, no ulceration, erythema , scaling or rash noted. Pigmentation normal throughout  Psych; Normal mood and affect. Judgement and concentration normal   Assessment & Plan:  Annual physical exam Annual exam as documented. Counseling done  re healthy lifestyle involving commitment  to 150 minutes exercise per week, heart healthy diet, and attaining healthy weight.The importance of adequate sleep also discussed.  Changes in health habits are decided on by the patient with goals and time frames  set for achieving them. Immunization and cancer screening needs are specifically addressed at this visit.   Acute bronchitis Z pack prescribed, pt to use robitussin with this  Acute sinusitis  Pack and saline nasal flushes recommended  NICOTINE ADDICTION Asked:confirms currently smokes 7 to 10 cigarettes/ day Assess: Unwilling to quit but cutting back Advise: needs to QUIT to reduce risk of cancer, cardio and cerebrovascular disease, alrewady has h/o breast cancer Assist: counseled for 5 minutes and literature provided Arrange: follow up in 3 months

## 2018-02-27 NOTE — Patient Instructions (Addendum)
F/U end Aigust, call if you need me before me   Excellent labs in January  Start 9 cigarettes / day for next 2 weeks, then 8 cigarettes per day for an additional 2 weeks etc   Az ithromycin is prescribed, use robitussin with this.    Fasting lipid, cmp and EGFR, first week in August   Acute Bronchitis, Adult Acute bronchitis is when air tubes (bronchi) in the lungs suddenly get swollen. The condition can make it hard to breathe. It can also cause these symptoms:  A cough.  Coughing up clear, yellow, or green mucus.  Wheezing.  Chest congestion.  Shortness of breath.  A fever.  Body aches.  Chills.  A sore throat. Follow these instructions at home:  Medicines  Take over-the-counter and prescription medicines only as told by your doctor.  If you were prescribed an antibiotic medicine, take it as told by your doctor. Do not stop taking the antibiotic even if you start to feel better. General instructions  Rest.  Drink enough fluids to keep your pee (urine) pale yellow.  Avoid smoking and secondhand smoke. If you smoke and you need help quitting, ask your doctor. Quitting will help your lungs heal faster.  Use an inhaler, cool mist vaporizer, or humidifier as told by your doctor.  Keep all follow-up visits as told by your doctor. This is important. How is this prevented? To lower your risk of getting this condition again:  Wash your hands often with soap and water. If you cannot use soap and water, use hand sanitizer.  Avoid contact with people who have cold symptoms.  Try not to touch your hands to your mouth, nose, or eyes.  Make sure to get the flu shot every year. Contact a doctor if:  Your symptoms do not get better in 2 weeks. Get help right away if:  You cough up blood.  You have chest pain.  You have very bad shortness of breath.  You become dehydrated.  You faint (pass out) or keep feeling like you are going to pass out.  You keep  throwing up (vomiting).  You have a very bad headache.  Your fever or chills gets worse. This information is not intended to replace advice given to you by your health care provider. Make sure you discuss any questions you have with your health care provider. Document Released: 06/20/2007 Document Revised: 08/15/2016 Document Reviewed: 06/22/2015 Elsevier Interactive Patient Education  2019 [employer].

## 2018-02-28 ENCOUNTER — Encounter: Payer: Self-pay | Admitting: Family Medicine

## 2018-02-28 NOTE — Assessment & Plan Note (Signed)
Z pack prescribed, pt to use robitussin with this

## 2018-02-28 NOTE — Assessment & Plan Note (Addendum)
Asked:confirms currently smokes 7 to 10 cigarettes/ day Assess: Unwilling to quit but cutting back Advise: needs to QUIT to reduce risk of cancer, cardio and cerebrovascular disease, alrewady has h/o breast cancer Assist: counseled for 5 minutes and literature provided Arrange: follow up in 3 months

## 2018-02-28 NOTE — Assessment & Plan Note (Signed)
Pack and saline nasal flushes recommended

## 2018-02-28 NOTE — Assessment & Plan Note (Signed)
Annual exam as documented. Counseling done  re healthy lifestyle involving commitment to 150 minutes exercise per week, heart healthy diet, and attaining healthy weight.The importance of adequate sleep also discussed. Changes in health habits are decided on by the patient with goals and time frames  set for achieving them. Immunization and cancer screening needs are specifically addressed at this visit. 

## 2018-03-04 ENCOUNTER — Ambulatory Visit (INDEPENDENT_AMBULATORY_CARE_PROVIDER_SITE_OTHER): Payer: Medicare Other | Admitting: Orthopaedic Surgery

## 2018-03-04 ENCOUNTER — Encounter: Payer: Self-pay | Admitting: Orthopaedic Surgery

## 2018-03-04 ENCOUNTER — Ambulatory Visit (INDEPENDENT_AMBULATORY_CARE_PROVIDER_SITE_OTHER): Payer: Medicare Other

## 2018-03-04 VITALS — BP 130/68 | HR 87 | Ht 64.0 in | Wt 170.0 lb

## 2018-03-04 DIAGNOSIS — F17210 Nicotine dependence, cigarettes, uncomplicated: Secondary | ICD-10-CM

## 2018-03-04 DIAGNOSIS — M25551 Pain in right hip: Secondary | ICD-10-CM

## 2018-03-04 DIAGNOSIS — G8929 Other chronic pain: Secondary | ICD-10-CM | POA: Diagnosis not present

## 2018-03-04 NOTE — Patient Instructions (Signed)
Steps to Quit Smoking    Smoking tobacco can be bad for your health. It can also affect almost every organ in your body. Smoking puts you and people around you at risk for many serious long-lasting (chronic) diseases. Quitting smoking is hard, but it is one of the best things that you can do for your health. It is never too late to quit.  What are the benefits of quitting smoking?  When you quit smoking, you lower your risk for getting serious diseases and conditions. They can include:  · Lung cancer or lung disease.  · Heart disease.  · Stroke.  · Heart attack.  · Not being able to have children (infertility).  · Weak bones (osteoporosis) and broken bones (fractures).  If you have coughing, wheezing, and shortness of breath, those symptoms may get better when you quit. You may also get sick less often. If you are pregnant, quitting smoking can help to lower your chances of having a baby of low birth weight.  What can I do to help me quit smoking?  Talk with your doctor about what can help you quit smoking. Some things you can do (strategies) include:  · Quitting smoking totally, instead of slowly cutting back how much you smoke over a period of time.  · Going to in-person counseling. You are more likely to quit if you go to many counseling sessions.  · Using resources and support systems, such as:  ? Online chats with a counselor.  ? Phone quitlines.  ? Printed self-help materials.  ? Support groups or group counseling.  ? Text messaging programs.  ? Mobile phone apps or applications.  · Taking medicines. Some of these medicines may have nicotine in them. If you are pregnant or breastfeeding, do not take any medicines to quit smoking unless your doctor says it is okay. Talk with your doctor about counseling or other things that can help you.  Talk with your doctor about using more than one strategy at the same time, such as taking medicines while you are also going to in-person counseling. This can help make  quitting easier.  What things can I do to make it easier to quit?  Quitting smoking might feel very hard at first, but there is a lot that you can do to make it easier. Take these steps:  · Talk to your family and friends. Ask them to support and encourage you.  · Call phone quitlines, reach out to support groups, or work with a counselor.  · Ask people who smoke to not smoke around you.  · Avoid places that make you want (trigger) to smoke, such as:  ? Bars.  ? Parties.  ? Smoke-break areas at work.  · Spend time with people who do not smoke.  · Lower the stress in your life. Stress can make you want to smoke. Try these things to help your stress:  ? Getting regular exercise.  ? Deep-breathing exercises.  ? Yoga.  ? Meditating.  ? Doing a body scan. To do this, close your eyes, focus on one area of your body at a time from head to toe, and notice which parts of your body are tense. Try to relax the muscles in those areas.  · Download or buy apps on your mobile phone or tablet that can help you stick to your quit plan. There are many free apps, such as QuitGuide from the CDC (Centers for Disease Control and Prevention). You can find more   support from smokefree.gov and other websites.  This information is not intended to replace advice given to you by your health care provider. Make sure you discuss any questions you have with your health care provider.  Document Released: 10/28/2008 Document Revised: 08/30/2015 Document Reviewed: 05/18/2014  Elsevier Interactive Patient Education © 2019 [employer]

## 2018-03-04 NOTE — Progress Notes (Signed)
Patient [name], female DOB:1945/11/21, 73 y.o. EHU:[id number]  Chief Complaint  Patient presents with  . Hip Pain    right     HPI  [name] is a 73 y.o. female who has right hip pain.  She fell on it several months ago and has more pain. She has no redness, no swelling, no paresthesias.  Her smoking has improved. She is down to 9 cigarettes a day now from a pack about a year ago when I last talked to her about it.  She is improving.  The patient was counseled about smoking and smoking cessation.  I spent about 4  minutes in doing this.  I, of course, determined the patient does smoke and frequency.  The patient smokes  9 to 10 cigarettes a day down from a pack a day..  I have advised the patient of problems medically that can occur with continued smoking including poor wound and bone healing as well as the obvious respiratory problems.  I have assessed the patient's willingness to cut back and quit smoking.  The patient has stated she is willing to cut back more.  I have told the patient to discuss with their family doctor also about smoking cessation.  I am willing to assist my patient in arranging this and to get additional help.I have suggested the patient have a set date to try to begin cutting back.  I have printed out a smoking cessation form about how to begin cutting back and suggestions.  I have recommended starting an immediate plan to cut back.  I will discuss this further when I see the patient in the future.  I have stressed that although this will be very difficult to accomplish, the benefits are very substantial and will give long term health benefits from now on.    Body mass index is 29.18 kg/m.  ROS  Review of Systems  HENT: Negative for congestion.   Respiratory: Positive for shortness of breath. Negative for cough.   Cardiovascular: Negative for chest pain and leg swelling.  Endocrine: Positive for cold intolerance.  Musculoskeletal: Positive  for arthralgias, gait problem, joint swelling and myalgias.  Allergic/Immunologic: Positive for environmental allergies.  All other systems reviewed and are negative.   All other systems reviewed and are negative.  The following is a summary of the past history medically, past history surgically, known current medicines, social history and family history.  This information is gathered electronically by the computer from prior information and documentation.  I review this each visit and have found including this information at this point in the chart is beneficial and informative.    Past Medical History:  Diagnosis Date  . Asthma   . Breast cancer ([place]) 2007   Stage I (T1b N0 M0), grade 1 well-differentiated carcinoma of the left breast status, post lumpectomy followed by radiation therapy. Her estrogen receptor receptors were 93%, progesterone receptors 67%. HER-2/neu was negative. No lymphovascular space invasion was seen. All margins were clear. Ki-67 marker was low at 1% with surgery on 11/15/2004. Treated then with post-lumpectomy radiation, finish  . Breast cancer, [place] breast ([place]) 2007  . COPD (chronic obstructive pulmonary disease) ([place])   . Diabetes mellitus without complication ([place])   . Diverticula of colon   . Hyperlipidemia   . Hypertension   . Kidney stones   . Nicotine dependence   . Osteoarthritis     Past Surgical History:  Procedure Laterality Date  . ABDOMINAL HYSTERECTOMY    .  BREAST SURGERY  2005 approx   left lumpectomy   . CHOLECYSTECTOMY N/A 05/26/2014   Procedure: LAPAROSCOPIC CHOLECYSTECTOMY;  Surgeon: Aviva Signs Md, MD;  Location: [facility];  Service: General;  Laterality: N/A;  . COLECTOMY     2005, diverticulitis  . COLONOSCOPY N/A 05/20/2017   Procedure: COLONOSCOPY;  Surgeon: Danie Binder, MD;  Location: [facility];  Service: Endoscopy;  Laterality: N/A;  1:45pm  . DILATION AND CURETTAGE OF UTERUS    . ESOPHAGOGASTRODUODENOSCOPY N/A 05/20/2017    Procedure: ESOPHAGOGASTRODUODENOSCOPY (EGD);  Surgeon: Danie Binder, MD;  Location: [facility];  Service: Endoscopy;  Laterality: N/A;  . left breast      cancer, in 2006  . TUBAL LIGATION      Family History  Problem Relation Age of Onset  . Breast cancer Mother   . COPD Father   . Emphysema Father   . Throat cancer Sister   . Glaucoma Brother   . Schizophrenia Brother   . Heart attack Brother   . Lung cancer Sister        former smoker  . Hepatitis C Daughter   . Seizures Daughter   . SIDS Son   . Colon cancer Neg Hx   . Colon polyps Neg Hx     Social History Social History   Tobacco Use  . Smoking status: Current Every Day Smoker    Packs/day: 1.00    Years: 50.00    Pack years: 50.00    Types: Cigarettes  . Smokeless tobacco: Never Used  . Tobacco comment: cigaretes  now 10 to 15 per day  Substance Use Topics  . Alcohol use: No    Alcohol/week: 0.0 standard drinks  . Drug use: No    Allergies  Allergen Reactions  . Penicillins Hives, Shortness Of Breath and Swelling    Has patient had a PCN reaction causing immediate rash, facial/tongue/throat swelling, SOB or lightheadedness with hypotension: yes Has patient had a PCN reaction causing severe rash involving mucus membranes or skin necrosis:no Has patient had a PCN reaction that required hospitalization: yes Has patient had a PCN reaction occurring within the last 10 years: no If all of the above answers are "NO", then may proceed with Cephalosporin use.  . Sulfonamide Derivatives Other (See Comments)    Shaking all over, seizure like symptoms. Hospitalization resulted     Current Outpatient Medications  Medication Sig Dispense Refill  . acetaminophen (TYLENOL) 325 MG tablet Take 650 mg by mouth every 6 (six) hours as needed.    Marland Kitchen albuterol (PROVENTIL) (2.5 MG/3ML) 0.083% nebulizer solution Take 2.5 mg by nebulization 3 (three) times daily as needed for wheezing or shortness of breath.    Marland Kitchen amLODipine  (NORVASC) 5 MG tablet TAKE 1 TABLET BY MOUTH ONCE A DAY. 90 tablet 0  . aspirin EC 81 MG tablet Take 81 mg by mouth daily.    . ATROVENT HFA 17 MCG/ACT inhaler INHALE 2 PUFFS BY MOUTH FOUR TIMES A DAY. 12.9 g 3  . azelastine (ASTELIN) 0.1 % nasal spray PLACE 2 SPRAYS INTO EACH NOSTRIL TWICE DAILY. 30 mL 5  . benazepril (LOTENSIN) 40 MG tablet TAKE ONE TABLET BY MOUTH DAILY. 90 tablet 2  . budesonide-formoterol (SYMBICORT) 160-4.5 MCG/ACT inhaler Inhale 2 puffs into the lungs 2 (two) times daily. 1 Inhaler 11  . Calcium Carbonate-Vitamin D (CALCIUM 600 + D PO) Take 1 tablet by mouth 2 (two) times daily. 600/400    . fluticasone (FLONASE)  50 MCG/ACT nasal spray Place 2 sprays into both nostrils daily.    Marland Kitchen loratadine (CLARITIN) 10 MG tablet TAKE 1 TABLET BY MOUTH ONCE DAILY FOR ALLERGIES. 30 tablet 0  . montelukast (SINGULAIR) 10 MG tablet Take 1 tablet (10 mg total) by mouth at bedtime. 90 tablet 3  . Omega-3 Fatty Acids (FISH OIL) 1000 MG CAPS Take 1 capsule by mouth 2 (two) times daily.     . polyethylene glycol powder (GLYCOLAX/MIRALAX) powder MIX 1 CAPFUL IN 8 OUNCES OF JUICE OR WATER AND DRINK ONCE DAILY. 527 g 5  . potassium chloride SA (K-DUR,KLOR-CON) 20 MEQ tablet TAKE 1 TABLET BY MOUTH DAILY. 30 tablet 0  . simvastatin (ZOCOR) 20 MG tablet Take 1 tablet (20 mg total) by mouth at bedtime. 90 tablet 3  . tiZANidine (ZANAFLEX) 4 MG tablet One by mouth every night before bed as needed for spasm 30 tablet 3  . traMADol (ULTRAM) 50 MG tablet TAKE (1) TABLET BY MOUTH EVERY SIX HOURS AS NEEDED. 60 tablet 3   No current facility-administered medications for this visit.      Physical Exam  Blood pressure 130/68, pulse 87, height '5\' 4"'  (1.626 m), weight 170 lb (77.1 kg).  Constitutional: overall normal hygiene, normal nutrition, well developed, normal grooming, normal body habitus. Assistive device:none  Musculoskeletal: gait and station Limp none, muscle tone and strength are normal, no  tremors or atrophy is present.  .  Neurological: coordination overall normal.  Deep tendon reflex/nerve stretch intact.  Sensation normal.  Cranial nerves II-XII intact.   Skin:   Normal overall no scars, lesions, ulcers or rashes. No psoriasis.  Psychiatric: Alert and oriented x 3.  Recent memory intact, remote memory unclear.  Normal mood and affect. Well groomed.  Good eye contact.  Cardiovascular: overall no swelling, no varicosities, no edema bilaterally, normal temperatures of the legs and arms, no clubbing, cyanosis and good capillary refill.  Lymphatic: palpation is normal.  She has slight pain with hip motion but no limp, no change in ROM.  NV intact.  X-rays were done of the right hip, reported separately.  All other systems reviewed and are negative   The patient has been educated about the nature of the problem(s) and counseled on treatment options.  The patient appeared to understand what I have discussed and is in agreement with it.  Encounter Diagnoses  Name Primary?  . Chronic pain of right hip Yes  . Cigarette nicotine dependence without complication     PLAN Call if any problems.  Precautions discussed.  Continue current medications.   Return to clinic 2 months   ,Electronically Signed Sanjuana Kava, MD 2/18/20201:48 PM

## 2018-03-20 ENCOUNTER — Other Ambulatory Visit: Payer: Self-pay | Admitting: Family Medicine

## 2018-03-21 ENCOUNTER — Other Ambulatory Visit: Payer: Self-pay | Admitting: Family Medicine

## 2018-03-24 ENCOUNTER — Telehealth: Payer: Self-pay | Admitting: Orthopaedic Surgery

## 2018-03-24 NOTE — Telephone Encounter (Signed)
Ms. Amara requests refill on Zanaflex 4 mgs.  Qty  30   Sig: One by mouth every night before bed as needed for spasm  Patient states she uses [facility]

## 2018-03-25 MED ORDER — TIZANIDINE HCL 4 MG PO TABS: ORAL_TABLET | ORAL | 3 refills | Status: DC

## 2018-04-10 ENCOUNTER — Ambulatory Visit (HOSPITAL_COMMUNITY): Payer: Medicare Other

## 2018-04-14 ENCOUNTER — Other Ambulatory Visit: Payer: Self-pay | Admitting: Family Medicine

## 2018-04-18 ENCOUNTER — Other Ambulatory Visit: Payer: Self-pay | Admitting: Family Medicine

## 2018-04-21 ENCOUNTER — Telehealth: Payer: Self-pay | Admitting: *Deleted

## 2018-04-21 NOTE — Telephone Encounter (Signed)
Pt called stated she fell Wednesday. She said today she still has pain in her hip and shoulder. Her stomach is swollen worse since she fell. She didn't want to call the ambulance due to Covid but she felt like it needed to be looked at. She is in a lot of pain still and has been since she fell. Wanted to know what Dr. Moshe Cipro recommended.

## 2018-04-22 NOTE — Telephone Encounter (Signed)
Tried x 2 to call her. No answer

## 2018-04-22 NOTE — Telephone Encounter (Signed)
Called pt no answer °

## 2018-04-24 ENCOUNTER — Other Ambulatory Visit: Payer: Self-pay | Admitting: Family Medicine

## 2018-04-24 MED ORDER — PREDNISONE 5 MG (21) PO TBPK: 5.0000 mg | ORAL_TABLET | ORAL | 0 refills | Status: DC

## 2018-04-24 MED ORDER — AZITHROMYCIN 250 MG PO TABS: ORAL_TABLET | ORAL | 0 refills | Status: DC

## 2018-04-24 NOTE — Telephone Encounter (Signed)
States she fell last week, still having significant pain where she fell, able to walk. Wheezing no fever  x 2 days, cough excess yellow  Sputum x 1 week, advised will erx pred dose pack and Z pack for bronchitis , go to Ed for worsening o symptoms or if not better in next week  she is to contact Ortho re the joint/ hip pain she is describing. No worse since fall over 1 week ago and is able to move all limbs

## 2018-04-24 NOTE — Progress Notes (Signed)
pred 5  

## 2018-05-03 ENCOUNTER — Emergency Department (HOSPITAL_COMMUNITY)
Admission: EM | Admit: 2018-05-03 | Discharge: 2018-05-03 | Disposition: A | Discharge status: Home / Self Care | Payer: Medicare Other | Attending: Emergency Medicine | Admitting: Emergency Medicine

## 2018-05-03 ENCOUNTER — Emergency Department (HOSPITAL_COMMUNITY): Payer: Medicare Other

## 2018-05-03 ENCOUNTER — Encounter (HOSPITAL_COMMUNITY): Payer: Self-pay

## 2018-05-03 ENCOUNTER — Other Ambulatory Visit: Payer: Self-pay

## 2018-05-03 DIAGNOSIS — E119 Type 2 diabetes mellitus without complications: Secondary | ICD-10-CM | POA: Diagnosis not present

## 2018-05-03 DIAGNOSIS — M25551 Pain in right hip: Secondary | ICD-10-CM | POA: Diagnosis not present

## 2018-05-03 DIAGNOSIS — M5441 Lumbago with sciatica, right side: Secondary | ICD-10-CM | POA: Insufficient documentation

## 2018-05-03 DIAGNOSIS — I10 Essential (primary) hypertension: Secondary | ICD-10-CM | POA: Insufficient documentation

## 2018-05-03 DIAGNOSIS — Z79899 Other long term (current) drug therapy: Secondary | ICD-10-CM | POA: Diagnosis not present

## 2018-05-03 DIAGNOSIS — W208XXA Other cause of strike by thrown, projected or falling object, initial encounter: Secondary | ICD-10-CM | POA: Diagnosis not present

## 2018-05-03 DIAGNOSIS — Y939 Activity, unspecified: Secondary | ICD-10-CM | POA: Insufficient documentation

## 2018-05-03 DIAGNOSIS — R0789 Other chest pain: Secondary | ICD-10-CM | POA: Insufficient documentation

## 2018-05-03 DIAGNOSIS — W19XXXA Unspecified fall, initial encounter: Secondary | ICD-10-CM | POA: Insufficient documentation

## 2018-05-03 DIAGNOSIS — M5489 Other dorsalgia: Secondary | ICD-10-CM | POA: Diagnosis present

## 2018-05-03 DIAGNOSIS — F17210 Nicotine dependence, cigarettes, uncomplicated: Secondary | ICD-10-CM | POA: Insufficient documentation

## 2018-05-03 DIAGNOSIS — R0781 Pleurodynia: Secondary | ICD-10-CM | POA: Diagnosis not present

## 2018-05-03 DIAGNOSIS — J449 Chronic obstructive pulmonary disease, unspecified: Secondary | ICD-10-CM | POA: Diagnosis not present

## 2018-05-03 DIAGNOSIS — Z7982 Long term (current) use of aspirin: Secondary | ICD-10-CM | POA: Insufficient documentation

## 2018-05-03 DIAGNOSIS — Y998 Other external cause status: Secondary | ICD-10-CM | POA: Insufficient documentation

## 2018-05-03 DIAGNOSIS — Y999 Unspecified external cause status: Secondary | ICD-10-CM | POA: Diagnosis not present

## 2018-05-03 DIAGNOSIS — M47816 Spondylosis without myelopathy or radiculopathy, lumbar region: Secondary | ICD-10-CM | POA: Diagnosis not present

## 2018-05-03 LAB — URINALYSIS, ROUTINE W REFLEX MICROSCOPIC
Bilirubin Urine: NEGATIVE
Glucose, UA: NEGATIVE mg/dL
Hgb urine dipstick: NEGATIVE
Ketones, ur: NEGATIVE mg/dL
Leukocytes,Ua: NEGATIVE
Nitrite: NEGATIVE
Protein, ur: NEGATIVE mg/dL
Specific Gravity, Urine: 1.008 (ref 1.005–1.030)
pH: 6 (ref 5.0–8.0)

## 2018-05-03 MED ORDER — TRAMADOL HCL 50 MG PO TABS: 50.0000 mg | ORAL_TABLET | Freq: Four times a day (QID) | ORAL | 0 refills | Status: DC | PRN

## 2018-05-03 NOTE — ED Notes (Signed)
Pt ambulatory to restroom

## 2018-05-03 NOTE — Discharge Instructions (Addendum)
Take the tramadol as needed for pain.  X-rays of the chest and lumbar back and right hip and pelvis without any acute abnormalities.  Urinalysis is negative for urinary tract infection.  Symptoms radiating into the right leg are consistent with some right-sided sciatica.  Take the tramadol as stated above for pain medicine make an appointment to follow-up with your doctor.  Short course of tramadol provided.

## 2018-05-03 NOTE — ED Provider Notes (Signed)
[facility] EMERGENCY DEPARTMENT Provider Note   CSN: [id number] Arrival date & time: 05/03/18  3762    History   Chief Complaint Chief Complaint  Patient presents with  . Back Pain    HPI [name] is a 73 y.o. female.     Patient with a constellation of symptoms.  But most things seem to center around right hip pain lumbar back pain pain to the right rib area all occurring since a fall when a lounge chair collapsed on her 2 weeks ago.  Since that time she seen Dr. Moshe Cipro had cough and wheezing induction was put on a prednisone pack and and a Z-Pak.  Patient does have pain radiating down the right leg suggestive of sciatica.  Denied any abdominal pain to me.  Did have some right lateral chest wall pain and did have right hip pain.  Mention to the nurse that she thought she may have a urinary tract infection.     Past Medical History:  Diagnosis Date  . Asthma   . Breast cancer ([place]) 2007   Stage I (T1b N0 M0), grade 1 well-differentiated carcinoma of the left breast status, post lumpectomy followed by radiation therapy. Her estrogen receptor receptors were 93%, progesterone receptors 67%. HER-2/neu was negative. No lymphovascular space invasion was seen. All margins were clear. Ki-67 marker was low at 1% with surgery on 11/15/2004. Treated then with post-lumpectomy radiation, finish  . Breast cancer, left breast ([place]) 2007  . COPD (chronic obstructive pulmonary disease) ([place])   . Diabetes mellitus without complication ([place])   . Diverticula of colon   . Hyperlipidemia   . Hypertension   . Kidney stones   . Nicotine dependence   . Osteoarthritis     Patient Active Problem List   Diagnosis Date Noted  . Coronary artery calcification seen on CAT scan 11/09/2017  . Diabetes mellitus type 2 in obese ([place]) 11/09/2017  . Colon adenomas 11/07/2017  . Bloating symptom 11/07/2017  . Melena 04/04/2017  . Allergic rhinitis 01/04/2015  . 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[place]) 09/26/2014  .  Hydrosalpinx 09/28/2013  . Annual physical exam 09/17/2013  . Need for immunization against influenza 09/17/2013  . Osteopenia 07/15/2013  . postmenopausal ovarian cyst 04/06/2013  . Pulmonary nodules 04/04/2012  . Breast cancer ([place]) 03/11/2012  . Acute sinusitis 05/08/2011  . Ganglion cyst 05/08/2011  . Acute bronchitis 02/18/2010  . COPD (chronic obstructive pulmonary disease) with chronic bronchitis ([place]) 07/24/2009  . Hyperlipidemia LDL goal <100 03/09/2009  . CONSTIPATION, CHRONIC 03/09/2009  . NICOTINE ADDICTION 10/10/2008  . Hypertension goal BP (blood pressure) < 130/80 09/29/2008  . GENERALIZED OSTEOARTHROSIS UNSPECIFIED SITE 09/29/2008    Past Surgical History:  Procedure Laterality Date  . ABDOMINAL HYSTERECTOMY    . BREAST SURGERY  2005 approx   left lumpectomy   . CHOLECYSTECTOMY N/A 05/26/2014   Procedure: LAPAROSCOPIC CHOLECYSTECTOMY;  Surgeon: Aviva Signs Md, MD;  Location: [facility] ORS;  Service: General;  Laterality: N/A;  . COLECTOMY     2005, diverticulitis  . COLONOSCOPY N/A 05/20/2017   Procedure: COLONOSCOPY;  Surgeon: Danie Binder, MD;  Location: [facility] ENDO SUITE;  Service: Endoscopy;  Laterality: N/A;  1:45pm  . DILATION AND CURETTAGE OF UTERUS    . ESOPHAGOGASTRODUODENOSCOPY N/A 05/20/2017   Procedure: ESOPHAGOGASTRODUODENOSCOPY (EGD);  Surgeon: Danie Binder, MD;  Location: [facility] ENDO SUITE;  Service: Endoscopy;  Laterality: N/A;  . left breast      cancer, in 2006  . TUBAL LIGATION  OB History    Gravida  8   Para  5   Term  5   Preterm      AB  3   Living  2     SAB  3   TAB      Ectopic      Multiple      Live Births               Home Medications    Prior to Admission medications   Medication Sig Start Date End Date Taking? Authorizing Provider  acetaminophen (TYLENOL) 325 MG tablet Take 650 mg by mouth every 6 (six) hours as needed.    [provider]  albuterol (PROVENTIL) (2.5 MG/3ML) 0.083% nebulizer  solution Take 2.5 mg by nebulization 3 (three) times daily as needed for wheezing or shortness of breath.    [provider]  amLODipine (NORVASC) 5 MG tablet TAKE 1 TABLET BY MOUTH ONCE A DAY. 04/18/18   [name], MD  aspirin EC 81 MG tablet Take 81 mg by mouth daily.    [provider]  ATROVENT HFA 17 MCG/ACT inhaler INHALE 2 PUFFS BY MOUTH FOUR TIMES A DAY. 04/14/18   [name], MD  azelastine (ASTELIN) 0.1 % nasal spray PLACE 2 SPRAYS INTO EACH NOSTRIL TWICE DAILY. 03/14/16   [name], MD  azithromycin (ZITHROMAX) 250 MG tablet Take two tablets on day one, then take one tablet once daily for an additional four days 04/24/18   [name], MD  benazepril (LOTENSIN) 40 MG tablet TAKE ONE TABLET BY MOUTH DAILY. 03/20/18   [name], MD  budesonide-formoterol [facility]) 160-4.5 MCG/ACT inhaler Inhale 2 puffs into the lungs 2 (two) times daily. 10/22/17   [name], MD  Calcium Carbonate-Vitamin D (CALCIUM 600 + D PO) Take 1 tablet by mouth 2 (two) times daily. 600/400    [provider]  fluticasone (FLONASE) 50 MCG/ACT nasal spray Place 2 sprays into both nostrils daily.    [provider]  loratadine (CLARITIN) 10 MG tablet TAKE 1 TABLET BY MOUTH ONCE DAILY FOR ALLERGIES. 03/24/18   [name], MD  montelukast (SINGULAIR) 10 MG tablet Take 1 tablet (10 mg total) by mouth at bedtime. 10/22/17   [name], MD  Omega-3 Fatty Acids (FISH OIL) 1000 MG CAPS Take 1 capsule by mouth 2 (two) times daily.     [provider]  polyethylene glycol powder (GLYCOLAX/MIRALAX) powder MIX 1 CAPFUL IN 8 OUNCES OF JUICE OR WATER AND DRINK ONCE DAILY. 04/23/16   [name], MD  potassium chloride SA (K-DUR,KLOR-CON) 20 MEQ tablet TAKE 1 TABLET BY MOUTH DAILY. 04/18/18   [name], MD  predniSONE (STERAPRED UNI-PAK 21 TAB) 5 MG (21) TBPK tablet Take 1 tablet (5 mg total) by mouth as directed.  Use as directed 04/24/18   [name], MD  simvastatin (ZOCOR) 20 MG tablet Take 1 tablet (20 mg total) by mouth at bedtime. 10/22/17   [name], MD  tiZANidine (ZANAFLEX) 4 MG tablet One by mouth every night before bed as needed for spasm 03/25/18   Sanjuana Kava, MD  traMADol (ULTRAM) 50 MG tablet TAKE (1) TABLET BY MOUTH EVERY SIX HOURS AS NEEDED. 01/20/18   Sanjuana Kava, MD    Family History Family History  Problem Relation Age of Onset  . Breast cancer Mother   . COPD Father   . Emphysema Father   .  Throat cancer Sister   . Glaucoma Brother   . Schizophrenia Brother   . Heart attack Brother   . Lung cancer Sister        former smoker  . Hepatitis C Daughter   . Seizures Daughter   . SIDS Son   . Colon cancer Neg Hx   . Colon polyps Neg Hx     Social History Social History   Tobacco Use  . Smoking status: Current Every Day Smoker    Packs/day: 1.00    Years: 50.00    Pack years: 50.00    Types: Cigarettes  . Smokeless tobacco: Never Used  . Tobacco comment: cigaretes  now 10 to 15 per day  Substance Use Topics  . Alcohol use: No    Alcohol/week: 0.0 standard drinks  . Drug use: No     Allergies   Penicillins and Sulfonamide derivatives   Review of Systems Review of Systems  Constitutional: Negative for chills and fever.  HENT: Negative for congestion, rhinorrhea and sore throat.   Eyes: Negative for visual disturbance.  Respiratory: Positive for cough. Negative for shortness of breath.   Cardiovascular: Positive for chest pain. Negative for leg swelling.  Gastrointestinal: Negative for abdominal pain, diarrhea, nausea and vomiting.  Genitourinary: Positive for dysuria.  Musculoskeletal: Positive for back pain. Negative for neck pain.  Skin: Negative for rash.  Neurological: Negative for dizziness, light-headedness and headaches.  Hematological: Does not bruise/bleed easily.  Psychiatric/Behavioral: Negative for confusion.      Physical Exam Updated Vital Signs BP (!) 148/86   Pulse 95   Temp 98.3 F (36.8 C) (Oral)   Resp 18   Ht 1.651 m ('5\' 5"' )   Wt 71.2 kg   SpO2 98%   BMI 26.13 kg/m   Physical Exam Vitals signs and nursing note reviewed.  Constitutional:      General: She is not in acute distress.    Appearance: Normal appearance. She is well-developed.  HENT:     Head: Normocephalic and atraumatic.  Eyes:     Extraocular Movements: Extraocular movements intact.     Conjunctiva/sclera: Conjunctivae normal.     Pupils: Pupils are equal, round, and reactive to light.  Neck:     Musculoskeletal: Normal range of motion and neck supple.     Comments: No posterior tenderness to palpation to  midline part of the cervical spine. Cardiovascular:     Rate and Rhythm: Normal rate and regular rhythm.     Heart sounds: No murmur.  Pulmonary:     Effort: Pulmonary effort is normal. No respiratory distress.     Breath sounds: Normal breath sounds.  Abdominal:     Palpations: Abdomen is soft.     Tenderness: There is no abdominal tenderness.  Musculoskeletal: Normal range of motion.        General: No swelling.     Comments: Good range of motion of the right hip without any pain.  No lower extremity swelling.  No tenderness to palpation to the lumbar spine area.  Good sensation to the right foot.  Good strength in the right foot.  Skin:    General: Skin is warm and dry.     Capillary Refill: Capillary refill takes less than 2 seconds.  Neurological:     General: No focal deficit present.     Mental Status: She is alert and oriented to person, place, and time.      ED Treatments / Results  Labs (all labs  ordered are listed, but only abnormal results are displayed) Labs Reviewed - No data to display  EKG None  Radiology No results found.  Procedures Procedures (including critical care time)  Medications Ordered in ED Medications - No data to display   Initial Impression / Assessment and  Plan / ED Course  I have reviewed the triage vital signs and the nursing notes.  Pertinent labs & imaging results that were available during my care of the patient were reviewed by me and considered in my medical decision making (see chart for details).      Patient with a myriad of complaints.  Will address perhaps some of the injury complaints and and get a urinalysis.  Will get chest x-ray with right rib series.  We will get right hip series including pelvis and will get a lumbar spine.  Patient certainly has symptoms that seem to be suggestive of sciatica but she has excellent movement of her right lower extremity at the knee and hip.  We will also get urinalysis since her concern for urinary tract infection.  Patient did ask if she could be tested for COVID.  Informed her that unless she meets criteria for admission with suspicion for COVID we are not testing.  Patient was seen by primary care doctor for cough and wheezing last week.  Patient in no respiratory distress.  Patient's x-ray work-up chest x-ray with rib series negative lumbar x-rays negative for any bony injuries.  X-ray right hip and pelvis also negative.  Patient symptoms are consistent with a right-sided sciatica.  No evidence of any bony injuries.  Noted on narcotic review the patient was given prescription of tramadol 15 days worth on April 2.  Patient will be given a short course of a few more tramadol to hold her until she can see be seen by her primary care provider next week.  Final Clinical Impressions(s) / ED Diagnoses   Final diagnoses:  Fall, initial encounter  Acute right-sided low back pain with right-sided sciatica  Chest wall pain    ED Discharge Orders    None       Fredia Sorrow, MD 05/03/18 1424

## 2018-05-03 NOTE — ED Triage Notes (Signed)
Pt reports that she fell while sitting in a lounge chair that had dry rotted approx 2 weeks ago. Pt was given abt and prednisone last week by Dr Moshe Cipro due to cough and wheezing. Pt reports here today due to pain in mid back and under right breast and abdomen

## 2018-05-06 ENCOUNTER — Encounter: Payer: Self-pay | Admitting: Family Medicine

## 2018-05-06 ENCOUNTER — Ambulatory Visit (INDEPENDENT_AMBULATORY_CARE_PROVIDER_SITE_OTHER): Payer: Medicare Other | Admitting: Family Medicine

## 2018-05-06 ENCOUNTER — Ambulatory Visit (INDEPENDENT_AMBULATORY_CARE_PROVIDER_SITE_OTHER): Payer: Medicare Other | Admitting: Orthopaedic Surgery

## 2018-05-06 ENCOUNTER — Encounter: Payer: Self-pay | Admitting: Orthopaedic Surgery

## 2018-05-06 ENCOUNTER — Other Ambulatory Visit: Payer: Self-pay

## 2018-05-06 DIAGNOSIS — M25551 Pain in right hip: Secondary | ICD-10-CM

## 2018-05-06 DIAGNOSIS — G8929 Other chronic pain: Secondary | ICD-10-CM

## 2018-05-06 DIAGNOSIS — F17210 Nicotine dependence, cigarettes, uncomplicated: Secondary | ICD-10-CM | POA: Diagnosis not present

## 2018-05-06 DIAGNOSIS — M5441 Lumbago with sciatica, right side: Secondary | ICD-10-CM

## 2018-05-06 NOTE — Patient Instructions (Addendum)
Thank you for completing you appt today via telephone. I appreciate the opportunity to provide you with the care for your health and wellness. Today we discussed: sciatica pain  Please continue the treatment Dr Luna Glasgow discussed with you.  As dicussed it appears you are doing better. I have attached some information about sciatica for you to review. It might help you with management of any pain you experience.   [place] YOUR HANDS WELL AND FREQUENTLY. AVOID TOUCHING YOUR FACE, UNLESS YOUR HANDS ARE FRESHLY WASHED.  GET FRESH AIR DAILY. STAY HYDRATED WITH WATER.   It was a pleasure to see you and I look forward to continuing to work together on your health and well-being. Please do not hesitate to call the office if you need care or have questions about your care.  Have a wonderful day and week. With Gratitude, Cherly Beach, DNP, AGNP-BC    Sciatica  Sciatica is pain, numbness, weakness, or tingling along the path of the sciatic nerve. The sciatic nerve starts in the lower back and runs down the back of each leg. The nerve controls the muscles in the lower leg and in the back of the knee. It also provides feeling (sensation) to the back of the thigh, the lower leg, and the sole of the foot. Sciatica is a symptom of another medical condition that pinches or puts pressure on the sciatic nerve. Generally, sciatica only affects one side of the body. Sciatica usually goes away on its own or with treatment. In some cases, sciatica may keep coming back (recur). What are the causes? This condition is caused by pressure on the sciatic nerve, or pinching of the sciatic nerve. This may be the result of:  A disk in between the bones of the spine (vertebrae) bulging out too far (herniated disk).  Age-related changes in the spinal disks (degenerative disk disease).  A pain disorder that affects a muscle in the buttock (piriformis syndrome).  Extra bone growth (bone spur) near the sciatic  nerve.  An injury or break (fracture) of the pelvis.  Pregnancy.  Tumor (rare). What increases the risk? The following factors may make you more likely to develop this condition:  Playing sports that place pressure or stress on the spine, such as football or weight lifting.  Having poor strength and flexibility.  A history of back injury.  A history of back surgery.  Sitting for long periods of time.  Doing activities that involve repetitive bending or lifting.  Obesity. What are the signs or symptoms? Symptoms can vary from mild to very severe, and they may include:  Any of these problems in the lower back, leg, hip, or buttock: ? Mild tingling or dull aches. ? Burning sensations. ? Sharp pains.  Numbness in the back of the calf or the sole of the foot.  Leg weakness.  Severe back pain that makes movement difficult. These symptoms may get worse when you cough, sneeze, or laugh, or when you sit or stand for long periods of time. Being overweight may also make symptoms worse. In some cases, symptoms may recur over time. How is this diagnosed? This condition may be diagnosed based on:  Your symptoms.  A physical exam. Your health care provider may ask you to do certain movements to check whether those movements trigger your symptoms.  You may have tests, including: ? Blood tests. ? X-rays. ? MRI. ? CT scan. How is this treated? In many cases, this condition improves on its own,  without any treatment. However, treatment may include:  Reducing or modifying physical activity during periods of pain.  Exercising and stretching to strengthen your abdomen and improve the flexibility of your spine.  Icing and applying heat to the affected area.  Medicines that help: ? To relieve pain and swelling. ? To relax your muscles.  Injections of medicines that help to relieve pain, irritation, and inflammation around the sciatic nerve (steroids).  Surgery. Follow these  instructions at home: Medicines  Take over-the-counter and prescription medicines only as told by your health care provider.  Do not drive or operate heavy machinery while taking prescription pain medicine. Managing pain  If directed, apply ice to the affected area. ? Put ice in a plastic bag. ? Place a towel between your skin and the bag. ? Leave the ice on for 20 minutes, 2-3 times a day.  After icing, apply heat to the affected area before you exercise or as often as told by your health care provider. Use the heat source that your health care provider recommends, such as a moist heat pack or a heating pad. ? Place a towel between your skin and the heat source. ? Leave the heat on for 20-30 minutes. ? Remove the heat if your skin turns bright red. This is especially important if you are unable to feel pain, heat, or cold. You may have a greater risk of getting burned. Activity  Return to your normal activities as told by your health care provider. Ask your health care provider what activities are safe for you. ? Avoid activities that make your symptoms worse.  Take brief periods of rest throughout the day. Resting in a lying or standing position is usually better than sitting to rest. ? When you rest for longer periods, mix in some mild activity or stretching between periods of rest. This will help to prevent stiffness and pain. ? Avoid sitting for long periods of time without moving. Get up and move around at least one time each hour.  Exercise and stretch regularly, as told by your health care provider.  Do not lift anything that is heavier than 10 lb (4.5 kg) while you have symptoms of sciatica. When you do not have symptoms, you should still avoid heavy lifting, especially repetitive heavy lifting.  When you lift objects, always use proper lifting technique, which includes: ? Bending your knees. ? Keeping the load close to your body. ? Avoiding twisting. General instructions   Use good posture. ? Avoid leaning forward while sitting. ? Avoid hunching over while standing.  Maintain a healthy weight. Excess weight puts extra stress on your back and makes it difficult to maintain good posture.  Wear supportive, comfortable shoes. Avoid wearing high heels.  Avoid sleeping on a mattress that is too soft or too hard. A mattress that is firm enough to support your back when you sleep may help to reduce your pain.  Keep all follow-up visits as told by your health care provider. This is important. Contact a health care provider if:  You have pain that wakes you up when you are sleeping.  You have pain that gets worse when you lie down.  Your pain is worse than you have experienced in the past.  Your pain lasts longer than 4 weeks.  You experience unexplained weight loss. Get help right away if:  You lose control of your bowel or bladder (incontinence).  You have: ? Weakness in your lower back, pelvis, buttocks, or legs that  gets worse. ? Redness or swelling of your back. ? A burning sensation when you urinate. This information is not intended to replace advice given to you by your health care provider. Make sure you discuss any questions you have with your health care provider. Document Released: 12/26/2000 Document Revised: 06/07/2015 Document Reviewed: 09/10/2014 Elsevier Interactive Patient Education  2019 [employer].

## 2018-05-06 NOTE — Progress Notes (Signed)
Virtual Visit via Video Note  I connected with [name] on 05/06/18 at 10:10 AM EDT by a video enabled telemedicine application and verified that I am speaking with the correct person using two identifiers.   I discussed the limitations of evaluation and management by telemedicine and the availability of in person appointments. The patient expressed understanding and agreed to proceed.  History of Present Illness: She fell and was seen in the ER on 05-03-2018.  She has pain in the right rib cage and right hip.  X-rays were negative. She was given Tramadol and told to take Tylenol also as needed.  She says she was "shook up" by the fall.  She came to the office to be seen this morning but was coughing and said she felt bad.  The visit was changed to virtual and she went back home.  She says her hip is better on the right. She has less pain. She has a bruise.  She is taking the medicine and it helps. She has some sciatica symptoms on the right at times.  She is moving about better.  She says her cough has been present for a week.  They did not do COVID-19 test in the ER.  I have reviewed the ER records, x-rays and reports.   Observations/Objective: Per above.  She says she is better.  Assessment and Plan: Encounter Diagnoses  Name Primary?  . Chronic pain of right hip Yes  . Cigarette nicotine dependence without complication      Follow Up Instructions: I will call her in one week for virtual visit.  She is to continue the Tramadol and Tylenol.  Call if any problem.  Precautions discussed.      I discussed the assessment and treatment plan with the patient. The patient was provided an opportunity to ask questions and all were answered. The patient agreed with the plan and demonstrated an understanding of the instructions.   The patient was advised to call back or seek an in-person evaluation if the symptoms worsen or if the condition fails to improve as anticipated.  I  provided 11 minutes of non-face-to-face time during this encounter.  This includes reviewing ER notes and x-rays.   Sanjuana Kava, MD

## 2018-05-06 NOTE — Progress Notes (Signed)
Virtual Visit via Telephone Note  I connected with [name] on 05/06/18 at  1:40 PM EDT by telephone and verified that I am speaking with the correct person using two identifiers.   I discussed the limitations, risks, security and privacy concerns of performing an evaluation and management service by telephone and the availability of in person appointments. I also discussed with the patient that there may be a patient responsible charge related to this service. The patient expressed understanding and agreed to proceed.  Location of Patient: Home Location of Provider: Telehealth Consent was obtain for visit to be over via telehealth. I verified that I am speaking with the correct person using two identifiers.  History of Present Illness: [name] is a 73 year old female pt of Dr Simpson's. She was send in the ED on 05/03/2018 secondary to several concerns. Predominately right hip, lumbar back pain, and pain in the right ribs. This pain occurred after a lounge chair collapsed on her. She had been seen by Dr Moshe Cipro since that time. Her x-rays were negative for findings. She was provided with Tramadol and tylenol. She was diagnosed with acute right sided low back pain with right sided sciatica.  This morning she followed up with ortho and saw Dr Luna Glasgow. She reported to him that her hip was better on the right side and she was having less pain. She reported a bruise as well. As she also reported to me during conversation.  She reported to Dr Luna Glasgow and myself that she is overall feeling better and moving around better.   Overall doing better and has no complaints today. Denies fevers, chills, chest pain and tightness, shortness of breath, leg swelling or weakness.   Past Medical, Surgical, Social History, Allergies, and Medications, ER and Ortho notes have been Reviewed.   Observations/Objective: See above, Clear communication during telephone visit. Memory intact.  Assessment and  Plan: 1. Acute back pain with sciatica, right Discussed ED visit, and visit with Dr Luna Glasgow. No other recommendations from my end. Attached Sciatica information to AVS to be mailed to patient so she can review preventive and treatment measures. Patient acknowledged agreement and understanding of the plan.   Follow Up Instructions: AVS printed and mailed   I discussed the assessment and treatment plan with the patient. The patient was provided an opportunity to ask questions and all were answered. The patient agreed with the plan and demonstrated an understanding of the instructions.   The patient was advised to call back or seek an in-person evaluation if the symptoms worsen or if the condition fails to improve as anticipated.  I provided 10 minutes of non-face-to-face time during this encounter.   Perlie Mayo, NP

## 2018-05-12 ENCOUNTER — Ambulatory Visit (HOSPITAL_COMMUNITY): Payer: Medicare Other

## 2018-05-13 ENCOUNTER — Ambulatory Visit: Payer: Medicare Other | Admitting: Orthopaedic Surgery

## 2018-05-13 ENCOUNTER — Other Ambulatory Visit: Payer: Self-pay

## 2018-05-21 ENCOUNTER — Ambulatory Visit (INDEPENDENT_AMBULATORY_CARE_PROVIDER_SITE_OTHER): Payer: Medicare Other | Admitting: Orthopaedic Surgery

## 2018-05-21 ENCOUNTER — Encounter: Payer: Self-pay | Admitting: Orthopaedic Surgery

## 2018-05-21 ENCOUNTER — Other Ambulatory Visit: Payer: Self-pay

## 2018-05-21 DIAGNOSIS — F17210 Nicotine dependence, cigarettes, uncomplicated: Secondary | ICD-10-CM | POA: Diagnosis not present

## 2018-05-21 DIAGNOSIS — G8929 Other chronic pain: Secondary | ICD-10-CM | POA: Diagnosis not present

## 2018-05-21 DIAGNOSIS — M79604 Pain in right leg: Secondary | ICD-10-CM

## 2018-05-21 DIAGNOSIS — M545 Low back pain: Secondary | ICD-10-CM

## 2018-05-21 DIAGNOSIS — M25551 Pain in right hip: Secondary | ICD-10-CM

## 2018-05-21 MED ORDER — TRAMADOL HCL 50 MG PO TABS: ORAL_TABLET | ORAL | 3 refills | Status: DC

## 2018-05-21 NOTE — Progress Notes (Signed)
Virtual Visit via Telephone Note  I connected with [name] on 05/21/18 at 10:00 AM EDT by telephone and verified that I am speaking with the correct person using two identifiers.   I discussed the limitations, risks, security and privacy concerns of performing an evaluation and management service by telephone and the availability of in person appointments. I also discussed with the patient that there may be a patient responsible charge related to this service. The patient expressed understanding and agreed to proceed.   History of Present Illness: She continues to have lower back pain with radiation to the right and the right hip pain.  She is taking her Tramadol and needs a refill.  I will refill it today.  She has no new trauma.  She has good and bad days.  She has no weakness, no spasms.  She is trying to be active.   Observations/Objective: Per above  Assessment and Plan: Encounter Diagnoses  Name Primary?  . Low back pain radiating to right lower extremity   . Chronic pain of right hip Yes  . Cigarette nicotine dependence without complication      Follow Up Instructions: I will make appointment by telephone in three weeks.  She is to call if worse.   I discussed the assessment and treatment plan with the patient. The patient was provided an opportunity to ask questions and all were answered. The patient agreed with the plan and demonstrated an understanding of the instructions.   The patient was advised to call back or seek an in-person evaluation if the symptoms worsen or if the condition fails to improve as anticipated.  I provided 8 minutes of non-face-to-face time during this encounter.   Sanjuana Kava, MD

## 2018-05-27 ENCOUNTER — Other Ambulatory Visit: Payer: Self-pay | Admitting: Family Medicine

## 2018-05-29 ENCOUNTER — Ambulatory Visit (HOSPITAL_COMMUNITY)
Admission: RE | Admit: 2018-05-29 | Discharge: 2018-05-29 | Disposition: A | Discharge status: Home / Self Care | Payer: Medicare Other | Source: Ambulatory Visit | Attending: Adult Health | Admitting: Adult Health

## 2018-05-29 ENCOUNTER — Other Ambulatory Visit: Payer: Self-pay

## 2018-05-29 DIAGNOSIS — Z1231 Encounter for screening mammogram for malignant neoplasm of breast: Secondary | ICD-10-CM | POA: Insufficient documentation

## 2018-06-06 ENCOUNTER — Telehealth: Payer: Self-pay | Admitting: Family Medicine

## 2018-06-06 NOTE — Telephone Encounter (Signed)
About a week of symptoms, bronchitis, blowing a lot of yellow\ clear out of nose. No fever. Wants a zthromycin called in

## 2018-06-09 NOTE — Telephone Encounter (Signed)
Pt needs an appointment, antibiotics are not being prescribed without an appoiuntment

## 2018-06-10 NOTE — Telephone Encounter (Signed)
Called pt she has a phone visit on 5-28 with Hannah at 11:00. Due to transportation issues she was not able to come into the office

## 2018-06-11 ENCOUNTER — Other Ambulatory Visit: Payer: Self-pay

## 2018-06-11 ENCOUNTER — Ambulatory Visit (INDEPENDENT_AMBULATORY_CARE_PROVIDER_SITE_OTHER): Payer: Medicare Other | Admitting: Orthopaedic Surgery

## 2018-06-11 ENCOUNTER — Encounter: Payer: Self-pay | Admitting: Orthopaedic Surgery

## 2018-06-11 DIAGNOSIS — M79604 Pain in right leg: Secondary | ICD-10-CM

## 2018-06-11 DIAGNOSIS — M545 Low back pain: Secondary | ICD-10-CM

## 2018-06-11 NOTE — Progress Notes (Signed)
Virtual Visit via Telephone Note  I connected with@ on 06/11/18 at  9:30 AM EDT by telephone and verified that I am speaking with the correct person using two identifiers.  Location: Patient: home Provider: home   I discussed the limitations, risks, security and privacy concerns of performing an evaluation and management service by telephone and the availability of in person appointments. I also discussed with the patient that there may be a patient responsible charge related to this service. The patient expressed understanding and agreed to proceed.   History of Present Illness: Her lower back pain is having good and bad days.  She says the rainy weather has made it worse. She has no new trauma, no weakness.  She is taking her pain medicine.   Observations/Objective: Per above.  Assessment and Plan: Encounter Diagnosis  Name Primary?  . Low back pain radiating to right lower extremity Yes     Follow Up Instructions: 2 months.   I discussed the assessment and treatment plan with the patient. The patient was provided an opportunity to ask questions and all were answered. The patient agreed with the plan and demonstrated an understanding of the instructions.   The patient was advised to call back or seek an in-person evaluation if the symptoms worsen or if the condition fails to improve as anticipated.  I provided 6 minutes of non-face-to-face time during this encounter.   Sanjuana Kava, MD

## 2018-06-12 ENCOUNTER — Encounter: Payer: Self-pay | Admitting: Family Medicine

## 2018-06-12 ENCOUNTER — Telehealth: Payer: Self-pay | Admitting: Family Medicine

## 2018-06-12 ENCOUNTER — Encounter (INDEPENDENT_AMBULATORY_CARE_PROVIDER_SITE_OTHER): Payer: Self-pay

## 2018-06-12 ENCOUNTER — Ambulatory Visit (INDEPENDENT_AMBULATORY_CARE_PROVIDER_SITE_OTHER): Payer: Medicare Other | Admitting: Family Medicine

## 2018-06-12 ENCOUNTER — Ambulatory Visit: Payer: Medicare Other | Admitting: Family Medicine

## 2018-06-12 VITALS — Ht 65.0 in | Wt 163.0 lb

## 2018-06-12 DIAGNOSIS — R0602 Shortness of breath: Secondary | ICD-10-CM

## 2018-06-12 DIAGNOSIS — J449 Chronic obstructive pulmonary disease, unspecified: Secondary | ICD-10-CM | POA: Diagnosis not present

## 2018-06-12 DIAGNOSIS — R05 Cough: Secondary | ICD-10-CM | POA: Diagnosis not present

## 2018-06-12 DIAGNOSIS — R059 Cough, unspecified: Secondary | ICD-10-CM

## 2018-06-12 DIAGNOSIS — R509 Fever, unspecified: Secondary | ICD-10-CM

## 2018-06-12 DIAGNOSIS — Z7189 Other specified counseling: Secondary | ICD-10-CM

## 2018-06-12 DIAGNOSIS — J3089 Other allergic rhinitis: Secondary | ICD-10-CM

## 2018-06-12 DIAGNOSIS — J4489 Other specified chronic obstructive pulmonary disease: Secondary | ICD-10-CM

## 2018-06-12 MED ORDER — AZELASTINE HCL 0.1 % NA SOLN: NASAL | 5 refills | Status: DC

## 2018-06-12 MED ORDER — LEVOFLOXACIN 750 MG PO TABS: 750.0000 mg | ORAL_TABLET | Freq: Every day | ORAL | 0 refills | Status: AC

## 2018-06-12 MED ORDER — BENZONATATE 100 MG PO CAPS: 200.0000 mg | ORAL_CAPSULE | Freq: Three times a day (TID) | ORAL | 0 refills | Status: DC | PRN

## 2018-06-12 NOTE — Telephone Encounter (Signed)
Called to schedule pt for Covid-19 testing. Unable to reach pt on either number on file. LVM on pt's mobile number to return call for scheduling.

## 2018-06-12 NOTE — Progress Notes (Signed)
Virtual Visit via Telephone Note   This visit type was conducted due to national recommendations for restrictions regarding the COVID-19 Pandemic (e.g. social distancing) in an effort to limit this patient's exposure and mitigate transmission in our community.  Due to her co-morbid illnesses, this patient is at least at moderate risk for complications without adequate follow up.  This format is felt to be most appropriate for this patient at this time.  The patient did not have access to video technology/had technical difficulties with video requiring transitioning to audio format only (telephone).  All issues noted in this document were discussed and addressed.  No physical exam could be performed with this format.    Evaluation Performed:  Follow-up visit  Date:  06/12/2018   ID:  Tanya, [name] 1945-06-24, MRN [id number]  Patient Location: Home Provider Location: Other:  telemedicine  Location of Patient: Home Location of Provider: Telehealth Consent was obtain for visit to be over via telehealth. I verified that I am speaking with the correct person using two identifiers.  PCP:  [name], MD   Chief Complaint:  sick  History of Present Illness:    [name] is a 73 y.o. female with history of hypertension, COPD with chronic bronchitis, diabetes, obesity, nicotine addiction, hyperlipidemia among other histories.  Patient of Dr. Moshe Cipro who is well-known to the clinic.  Presents today after not feeling good with onset of sickness about a week ago.  Denies being around others that are sick but she is unsure.  Reports that she still has had some company coming and going and not everyone wears a mask when they are around her.  She does wear a mask when she goes outside.  Or to the store.  But is not wearing it when others come over.  She also has not inquired if anyone is been feeling sick.  And when they are wearing a mask she she states "cannot really tell if they  have the sniffles" reports that she took her temperature a couple times and it was over 100 with the highest being 101.  She reported to take her temperature because she was feeling chilly and having the chills.  She reports that she has felt this within the last 24 to 48 hours.  But her thermometer is not working anymore.  Symptoms include a cough that is bringing up mucus that is brown and rust color sometimes green.  This is been going on for a week now.  She does report having some nasal congestion, with some dark-looking mucus.  She reports having some shortness of breath that is more exceeding than her normal baseline shortness of breath with COPD.  She reports that she is unable to use her nebulizer treatment often because it makes her feel very jittery and makes her feel worse.  She reports that her inhalers are not doing too much to help her with her shortness of breath.  And her coughing.  Additionally, she reports that she is feeling overall achy and malaise.  Just does not feel well at all.  She reports having some sinus congestion and pressure.  Having some frontal head pain and cheek discomfort pain reports that her left ear hurts but her right ear is fine.  Denies having any sore throat and or drainage from her ears or eyes.  Does not feel like this is related to seasonal allergies.  She also reports having some dizziness and headaches.  Though these are not consistent in nature.  The patient does have symptoms concerning for COVID-19 infection (fever, chills, cough, or new shortness of breath).    Past Medical, Surgical, Social History, Allergies, and Medications have been Reviewed.  Past Medical History:  Diagnosis Date  . Asthma   . Breast cancer ([place]) 2007   Stage I (T1b N0 M0), grade 1 well-differentiated carcinoma of the left breast status, post lumpectomy followed by radiation therapy. Her estrogen receptor receptors were 93%, progesterone receptors 67%. HER-2/neu was  negative. No lymphovascular space invasion was seen. All margins were clear. Ki-67 marker was low at 1% with surgery on 11/15/2004. Treated then with post-lumpectomy radiation, finish  . Breast cancer, left breast ([place]) 2007  . COPD (chronic obstructive pulmonary disease) ([place])   . Diabetes mellitus without complication ([place])   . Diverticula of colon   . Hyperlipidemia   . Hypertension   . Kidney stones   . Nicotine dependence   . Osteoarthritis    Past Surgical History:  Procedure Laterality Date  . ABDOMINAL HYSTERECTOMY    . BREAST SURGERY  2005 approx   left lumpectomy   . CHOLECYSTECTOMY N/A 05/26/2014   Procedure: LAPAROSCOPIC CHOLECYSTECTOMY;  Surgeon: Aviva Signs Md, MD;  Location: [facility];  Service: General;  Laterality: N/A;  . COLECTOMY     2005, diverticulitis  . COLONOSCOPY N/A 05/20/2017   Procedure: COLONOSCOPY;  Surgeon: Danie Binder, MD;  Location: [facility];  Service: Endoscopy;  Laterality: N/A;  1:45pm  . DILATION AND CURETTAGE OF UTERUS    . ESOPHAGOGASTRODUODENOSCOPY N/A 05/20/2017   Procedure: ESOPHAGOGASTRODUODENOSCOPY (EGD);  Surgeon: Danie Binder, MD;  Location: [facility];  Service: Endoscopy;  Laterality: N/A;  . left breast      cancer, in 2006  . TUBAL LIGATION       Current Meds  Medication Sig  . acetaminophen (TYLENOL) 325 MG tablet Take 650 mg by mouth every 6 (six) hours as needed.  Marland Kitchen albuterol (PROVENTIL) (2.5 MG/3ML) 0.083% nebulizer solution Take 2.5 mg by nebulization 3 (three) times daily as needed for wheezing or shortness of breath.  Marland Kitchen amLODipine (NORVASC) 5 MG tablet TAKE 1 TABLET BY MOUTH ONCE A DAY.  Marland Kitchen aspirin EC 81 MG tablet Take 81 mg by mouth daily.  . ATROVENT HFA 17 MCG/ACT inhaler INHALE 2 PUFFS BY MOUTH FOUR TIMES A DAY.  Marland Kitchen azelastine (ASTELIN) 0.1 % nasal spray PLACE 2 SPRAYS INTO EACH NOSTRIL TWICE DAILY.  . benazepril (LOTENSIN) 40 MG tablet TAKE ONE TABLET BY MOUTH DAILY.  . budesonide-formoterol (SYMBICORT)  160-4.5 MCG/ACT inhaler Inhale 2 puffs into the lungs 2 (two) times daily.  . Calcium Carbonate-Vitamin D (CALCIUM 600 + D PO) Take 1 tablet by mouth 2 (two) times daily. 600/400  . fluticasone (FLONASE) 50 MCG/ACT nasal spray Place 2 sprays into both nostrils daily.  Marland Kitchen loratadine (CLARITIN) 10 MG tablet TAKE 1 TABLET BY MOUTH ONCE DAILY FOR ALLERGIES.  Marland Kitchen montelukast (SINGULAIR) 10 MG tablet Take 1 tablet (10 mg total) by mouth at bedtime.  . Omega-3 Fatty Acids (FISH OIL) 1000 MG CAPS Take 1 capsule by mouth 2 (two) times daily.   . polyethylene glycol powder (GLYCOLAX/MIRALAX) powder MIX 1 CAPFUL IN 8 OUNCES OF JUICE OR WATER AND DRINK ONCE DAILY.  Marland Kitchen potassium chloride SA (K-DUR,KLOR-CON) 20 MEQ tablet TAKE 1 TABLET BY MOUTH DAILY.  . simvastatin (ZOCOR) 20 MG tablet TAKE (1) TABLET BY MOUTH DAILY AT 6 PM FOR CHOLESTEROL.  Marland Kitchen  tiZANidine (ZANAFLEX) 4 MG tablet One by mouth every night before bed as needed for spasm  . traMADol (ULTRAM) 50 MG tablet TAKE (1) TABLET BY MOUTH EVERY SIX HOURS AS NEEDED.  . [DISCONTINUED] azelastine (ASTELIN) 0.1 % nasal spray PLACE 2 SPRAYS INTO EACH NOSTRIL TWICE DAILY.     Allergies:   Penicillins and Sulfonamide derivatives   Social History   Tobacco Use  . Smoking status: Current Every Day Smoker    Packs/day: 1.00    Years: 50.00    Pack years: 50.00    Types: Cigarettes  . Smokeless tobacco: Never Used  . Tobacco comment: cigaretes  now 10 to 15 per day  Substance Use Topics  . Alcohol use: No    Alcohol/week: 0.0 standard drinks  . Drug use: No     Family Hx: The patient's family history includes Breast cancer in her mother; COPD in her father; Emphysema in her father; Glaucoma in her brother; Heart attack in her brother; Hepatitis C in her daughter; Lung cancer in her sister; SIDS in her son; Schizophrenia in her brother; Seizures in her daughter; Throat cancer in her sister. There is no history of Colon cancer or Colon polyps.  ROS:   Please  see the history of present illness.    Review of Systems  Constitutional: Positive for chills and fever.  HENT: Positive for congestion and sinus pain. Negative for ear discharge, ear pain, hearing loss, sore throat and tinnitus.   Eyes: Negative.   Respiratory: Positive for cough, sputum production, shortness of breath and wheezing.   Cardiovascular: Negative for chest pain and palpitations.  Gastrointestinal: Negative.   Genitourinary: Negative.   Musculoskeletal: Positive for myalgias.  Skin: Positive for rash.       Big red spots  Neurological: Positive for dizziness and headaches. Negative for weakness.  Psychiatric/Behavioral: Negative.   All other systems reviewed and are negative.   Labs/Other Tests and Data Reviewed:    Recent Labs: 09/17/2017: Hemoglobin 14.1; Platelets 331 01/24/2018: ALT 11; BUN 22; Creat 0.74; Potassium 4.6; Sodium 141; TSH 1.49   Recent Lipid Panel Lab Results  Component Value Date/Time   CHOL 188 01/24/2018 11:32 AM   TRIG 130 01/24/2018 11:32 AM   HDL 71 01/24/2018 11:32 AM   CHOLHDL 2.6 01/24/2018 11:32 AM   LDLCALC 94 01/24/2018 11:32 AM    Wt Readings from Last 3 Encounters:  06/12/18 163 lb (73.9 kg)  05/03/18 157 lb (71.2 kg)  03/04/18 170 lb (77.1 kg)     Objective:    Vital Signs:  Ht 5' 5" (1.651 m)   Wt 163 lb (73.9 kg)   BMI 27.12 kg/m    GEN:  alert and oriented RESPIRATORY:  mild shortness of breath during conversation PSYCH:  normal affect, mood, and good communication  ASSESSMENT & PLAN:    1. COPD (chronic obstructive pulmonary disease) with chronic bronchitis (HCC) Questionable deterioration, signs and symptoms are consistent with possible COPD exacerbation secondary to chronic bronchitis.  Unsure if virus, COVID is the culprit of this.  We will be treating her and covering her for Pseudomonas just to make sure.  We will bring her back in 1 week in the office to assess her oxygenation levels and possible need for chest  x-ray  - levofloxacin (LEVAQUIN) 750 MG tablet; Take 1 tablet (750 mg total) by mouth daily for 5 days.  Dispense: 5 tablet; Refill: 0 - benzonatate (TESSALON PERLES) 100 MG capsule; Take 2 capsules (200 mg  total) by mouth 3 (three) times daily as needed for cough.  Dispense: 20 capsule; Refill: 0  2. Shortness of breath New onset of worsening shortness of breath outside of her regular baseline COPD shortness of breath.  She reports that her inhaler is not providing her with much relief, nebulizer is making her very jumpy and she does not like to use it as much.  Will be ordering coronavirus testing to make sure that this is not an onset case of it possible need for admission secondary to comorbidities and or hypoxia which is not able to be assessed right now secondary to the telemedicine visit.  Education provided on the COVID screening and precautions over the next several days until we have results back.  She is advised of strict precautions to report to the emergency room if she has any worsening fevers chills, cough, shortness of breath that is unrelieved by inhaler use.  Advised her to try to utilize her nebulizer inhaler if she is able to.    - Novel Coronavirus, NAA ([facility])  [place] testing site only - levofloxacin (LEVAQUIN) 750 MG tablet; Take 1 tablet (750 mg total) by mouth daily for 5 days.  Dispense: 5 tablet; Refill: 0  3. Fever, unspecified fever cause Fever of unknown cause at this time.  Questioning signs and symptoms of COVID will be assessing her for possible COPD exacerbation versus COVID.  - Novel Coronavirus, NAA ([facility])  [place] testing site only - levofloxacin (LEVAQUIN) 750 MG tablet; Take 1 tablet (750 mg total) by mouth daily for 5 days.  Dispense: 5 tablet; Refill: 0  4. Cough Has productive cough noted today during conversation.  Reports that she has mucus production that is green and rust colored in nature.  Possible differential of kidney acquired pneumonia  versus COPD exacerbation versus COVID or combination of thereof.  We will be providing treatment and screening for these and following closely.  - Novel Coronavirus, NAA ([facility])  [place] testing site only - levofloxacin (LEVAQUIN) 750 MG tablet; Take 1 tablet (750 mg total) by mouth daily for 5 days.  Dispense: 5 tablet; Refill: 0 - benzonatate (TESSALON PERLES) 100 MG capsule; Take 2 capsules (200 mg total) by mouth 3 (three) times daily as needed for cough.  Dispense: 20 capsule; Refill: 0  5. Educated About Covid-19 Virus Infection  COVID-19 Education: The signs and symptoms of COVID-19 were discussed with the patient. Along with the importance of social distancing was discussed today. She was advised of testing and that the center will call her with instructions. She is advise to stay at home post screening until we have results, unless her symptoms worse upon which she is advised to go to the nearest ER.   6. Other allergic rhinitis Controlled, provided refill - azelastine (ASTELIN) 0.1 % nasal spray; PLACE 2 SPRAYS INTO EACH NOSTRIL TWICE DAILY.  Dispense: 30 mL; Refill: 5  Time:   Today, I have spent 15 minutes with the patient with telehealth technology discussing the above problems.     Medication Adjustments/Labs and Tests Ordered: Current medicines are reviewed at length with the patient today.  Concerns regarding medicines are outlined above.   Tests Ordered: Orders Placed This Encounter  Procedures  . Novel Coronavirus, NAA ([facility])  [place] testing site only    Medication Changes: Meds ordered this encounter  Medications  . azelastine (ASTELIN) 0.1 % nasal spray    Sig: PLACE 2 SPRAYS INTO EACH NOSTRIL TWICE DAILY.  Dispense:  30 mL    Refill:  5  . levofloxacin (LEVAQUIN) 750 MG tablet    Sig: Take 1 tablet (750 mg total) by mouth daily for 5 days.    Dispense:  5 tablet    Refill:  0    Order Specific Question:   Supervising Provider    Answer:    SIMPSON, MARGARET E [[id number]]  . benzonatate (TESSALON PERLES) 100 MG capsule    Sig: Take 2 capsules (200 mg total) by mouth 3 (three) times daily as needed for cough.    Dispense:  20 capsule    Refill:  0    Order Specific Question:   Supervising Provider    Answer:   [name] [[id number]]    Disposition:  Follow up 1 week, in office   Signed, Perlie Mayo, NP  06/12/2018 12:30 PM     [facility]

## 2018-06-12 NOTE — Telephone Encounter (Signed)
-----   Message from Marcia Brash, [place] sent at 06/12/2018  9:18 AM EDT ----- Hello,  This patient needs an appointment to be tested for Covid 19 for the symptoms of fever, sob, and cough. The order is in and signed. Please and thank you kindly. Provider is Cherly [place], DNP  All the best, Abby W.

## 2018-06-12 NOTE — Patient Instructions (Addendum)
Thank you for thank you for completing y for anything growing right now our visit via telemedicine today-telephone. I appreciate the opportunity to provide you with the care for your health and wellness. Today we discussed: Increase in shortness of breath and cough and fever  Currently we will be providing you with treatment with COPD exacerbation secondary with chronic bronchitis.  Please follow the instructions given to you by the [facility] when they call you for screening for the COVID.  Please recall the information that I provided to you over the telephone today.  Please refrain from going outside and or going around others during the screening time.  Practicing social distancing and quarantining yourself at home is the best practice that until we know what is going on.  Please review the COVID home isolation guidelines provided to [place] by the [facility].  These are attached to the back of this form.  If you come back negative for COVID we will let you know this as soon as possible.  If you come back positive we will also let you know this.  As discussed per our visit please make sure that you are keeping up with your inhalers use and making sure you use your nebulizer as directed.  I know this makes you feel jittery but it does help open up your lungs and provide air quality that you need secondary to this infection.  I have sent in a medication for you called Levaquin please take this medication in full and complete the full course.  As you do not want to have a reinfection. Please make sure that you are staying hydrated and eating a well-balanced diet as well during this time.  Please refrain from smoking, if you need help with smoking sensation please note that we are here for you during this time.  And we can help you in any way possible that would be great just let us know.  [place] YOUR HANDS WELL AND FREQUENTLY. AVOID TOUCHING YOUR FACE, UNLESS YOUR  HANDS ARE FRESHLY WASHED.  GET FRESH AIR DAILY. STAY HYDRATED WITH WATER.   It was a pleasure to see you and I look forward to continuing to work together on your health and well-being. Please do not hesitate to call the office if you need care or have questions about your care.  Have a wonderful day and week. With Gratitude, Cherly Beach, DNP, AGNP-BC      Person Under Monitoring Name: [name]  Location: [address] [place] [address]   Infection Prevention Recommendations for Individuals Confirmed to have, or Being Evaluated for, 2019 Novel Coronavirus (COVID-19) Infection Who Receive Care at Home  Individuals who are confirmed to have, or are being evaluated for, COVID-19 should follow the prevention steps below until a healthcare provider or local or state health department says they can return to normal activities.  Stay home except to get medical care You should restrict activities outside your home, except for getting medical care. Do not go to work, school, or public areas, and do not use public transportation or taxis.  Call ahead before visiting your doctor Before your medical appointment, call the healthcare provider and tell them that you have, or are being evaluated for, COVID-19 infection. This will help the healthcare provider's office take steps to keep other people from getting infected. Ask your healthcare provider to call the local or state health department.  Monitor your symptoms Seek prompt  medical attention if your illness is worsening (e.g., difficulty breathing). Before going to your medical appointment, call the healthcare provider and tell them that you have, or are being evaluated for, COVID-19 infection. Ask your healthcare provider to call the local or state health department.  Wear a facemask You should wear a facemask that covers your nose and mouth when you are in the same room with other people and when you visit a  healthcare provider. People who live with or visit you should also wear a facemask while they are in the same room with you.  Separate yourself from other people in your home As much as possible, you should stay in a different room from other people in your home. Also, you should use a separate bathroom, if available.  Avoid sharing household items You should not share dishes, drinking glasses, cups, eating utensils, towels, bedding, or other items with other people in your home. After using these items, you should wash them thoroughly with soap and water.  Cover your coughs and sneezes Cover your mouth and nose with a tissue when you cough or sneeze, or you can cough or sneeze into your sleeve. Throw used tissues in a lined trash can, and immediately wash your hands with soap and water for at least 20 seconds or use an alcohol-based hand rub.  Wash your [facility] your hands often and thoroughly with soap and water for at least 20 seconds. You can use an alcohol-based hand sanitizer if soap and water are not available and if your hands are not visibly dirty. Avoid touching your eyes, nose, and mouth with unwashed hands.   Prevention Steps for Caregivers and Household Members of Individuals Confirmed to have, or Being Evaluated for, COVID-19 Infection Being Cared for in the Home  If you live with, or provide care at home for, a person confirmed to have, or being evaluated for, COVID-19 infection please follow these guidelines to prevent infection:  Follow healthcare provider's instructions Make sure that you understand and can help the patient follow any healthcare provider instructions for all care.  Provide for the patient's basic needs You should help the patient with basic needs in the home and provide support for getting groceries, prescriptions, and other personal needs.  Monitor the patient's symptoms If they are getting sicker, call his or her medical provider and tell  them that the patient has, or is being evaluated for, COVID-19 infection. This will help the healthcare provider's office take steps to keep other people from getting infected. Ask the healthcare provider to call the local or state health department.  Limit the number of people who have contact with the patient  If possible, have only one caregiver for the patient.  Other household members should stay in another home or place of residence. If this is not possible, they should stay  in another room, or be separated from the patient as much as possible. Use a separate bathroom, if available.  Restrict visitors who do not have an essential need to be in the home.  Keep older adults, very young children, and other sick people away from the patient Keep older adults, very young children, and those who have compromised immune systems or chronic health conditions away from the patient. This includes people with chronic heart, lung, or kidney conditions, diabetes, and cancer.  Ensure good ventilation Make sure that shared spaces in the home have good air flow, such as from an air conditioner or an opened window, weather  permitting.  Wash your hands often  Wash your hands often and thoroughly with soap and water for at least 20 seconds. You can use an alcohol based hand sanitizer if soap and water are not available and if your hands are not visibly dirty.  Avoid touching your eyes, nose, and mouth with unwashed hands.  Use disposable paper towels to dry your hands. If not available, use dedicated cloth towels and replace them when they become wet.  Wear a facemask and gloves  Wear a disposable facemask at all times in the room and gloves when you touch or have contact with the patient's blood, body fluids, and/or secretions or excretions, such as sweat, saliva, sputum, nasal mucus, vomit, urine, or feces.  Ensure the mask fits over your nose and mouth tightly, and do not touch it during use.   Throw out disposable facemasks and gloves after using them. Do not reuse.  Wash your hands immediately after removing your facemask and gloves.  If your personal clothing becomes contaminated, carefully remove clothing and launder. Wash your hands after handling contaminated clothing.  Place all used disposable facemasks, gloves, and other waste in a lined container before disposing them with other household waste.  Remove gloves and wash your hands immediately after handling these items.  Do not share dishes, glasses, or other household items with the patient  Avoid sharing household items. You should not share dishes, drinking glasses, cups, eating utensils, towels, bedding, or other items with a patient who is confirmed to have, or being evaluated for, COVID-19 infection.  After the person uses these items, you should wash them thoroughly with soap and water.  Wash laundry thoroughly  Immediately remove and wash clothes or bedding that have blood, body fluids, and/or secretions or excretions, such as sweat, saliva, sputum, nasal mucus, vomit, urine, or feces, on them.  Wear gloves when handling laundry from the patient.  Read and follow directions on labels of laundry or clothing items and detergent. In general, wash and dry with the warmest temperatures recommended on the label.  Clean all areas the individual has used often  Clean all touchable surfaces, such as counters, tabletops, doorknobs, bathroom fixtures, toilets, phones, keyboards, tablets, and bedside tables, every day. Also, clean any surfaces that may have blood, body fluids, and/or secretions or excretions on them.  Wear gloves when cleaning surfaces the patient has come in contact with.  Use a diluted bleach solution (e.g., dilute bleach with 1 part bleach and 10 parts water) or a household disinfectant with a label that says EPA-registered for coronaviruses. To make a bleach solution at home, add 1 tablespoon of  bleach to 1 quart (4 cups) of water. For a larger supply, add  cup of bleach to 1 gallon (16 cups) of water.  Read labels of cleaning products and follow recommendations provided on product labels. Labels contain instructions for safe and effective use of the cleaning product including precautions you should take when applying the product, such as wearing gloves or eye protection and making sure you have good ventilation during use of the product.  Remove gloves and wash hands immediately after cleaning.  Monitor yourself for signs and symptoms of illness Caregivers and household members are considered close contacts, should monitor their health, and will be asked to limit movement outside of the home to the extent possible. Follow the monitoring steps for close contacts listed on the symptom monitoring form.   ? If you have additional questions, contact your local health  department or call the epidemiologist on call at [phone] (available 24/7). ? This guidance is subject to change. For the most up-to-date guidance from [facility], please refer to their website: [email or web address]

## 2018-06-13 NOTE — Telephone Encounter (Signed)
Called pt back to schedule testing. Pt asked that I speak with her grand daughter because she would be driving pt to site. Pt's grand daughter stated that "they (provider and pt) are freaking out due to Covid" Granddaughter - [name] stated that pt was up cooking breakfast this morning and is feeling fine. "She wouldn't have been up fixing breakfast if she had covid" per grand-daughter.   I re-stated the concerns (listed below) with pt/grand daughter,- [name] stated that pt has a "smokers cough".  I was advised that they would like to decline testing at this time.

## 2018-06-20 ENCOUNTER — Encounter (INDEPENDENT_AMBULATORY_CARE_PROVIDER_SITE_OTHER): Payer: Self-pay

## 2018-06-20 ENCOUNTER — Encounter: Payer: Self-pay | Admitting: Family Medicine

## 2018-06-20 ENCOUNTER — Ambulatory Visit (INDEPENDENT_AMBULATORY_CARE_PROVIDER_SITE_OTHER): Payer: Medicare HMO | Admitting: Family Medicine

## 2018-06-20 ENCOUNTER — Other Ambulatory Visit: Payer: Self-pay

## 2018-06-20 VITALS — BP 132/66 | HR 91 | Temp 98.3°F | Resp 14 | Ht 65.0 in | Wt 175.1 lb

## 2018-06-20 DIAGNOSIS — F17200 Nicotine dependence, unspecified, uncomplicated: Secondary | ICD-10-CM | POA: Diagnosis not present

## 2018-06-20 DIAGNOSIS — J449 Chronic obstructive pulmonary disease, unspecified: Secondary | ICD-10-CM

## 2018-06-20 DIAGNOSIS — R0602 Shortness of breath: Secondary | ICD-10-CM

## 2018-06-20 NOTE — Progress Notes (Signed)
Subjective:     Patient ID: [name], female   DOB: 09/12/1945, 73 y.o.   MRN: [id number]  [name] presents for oxygen check  [name] is a 73 y.o. female with history of hypertension, COPD with chronic bronchitis, diabetes, obesity, nicotine addiction, hyperlipidemia among other histories.  Patient of Dr. Moshe Cipro who is well-known to the clinic.  Presents today for follow-up for oxygen check.  Had telemedicine visit on May 28.  At that time she was not feeling well and had onset of sickness of about a week.  I treated her for COPD with chronic bronchitis exacerbation.  She completed the antibiotic and use the Tessalon Perles over the last week and is felt much better.  She came in today to make sure that her oxygenation was still doing well.  Additional to treatment for COPD exacerbation.  There was question as to whether or not she should be tested for the coronavirus, granddaughter would not take her to get tested for the coronavirus said that she was just sick because she still smokes.  Therefore she never got the coronavirus test done.    Today she reports that her cough and shortness of breath are back to baseline.  She reports that she is taking all of her medications as directed.  And not having any trouble.  Reports that she is feeling much better since completing the antibiotic and each day she feels a little bit better after.  Denies having any fevers chills cough or shortness of breath outside of what she normally has.  Denies having any chest pain, leg swelling, palpitations, headaches, dizziness or visual changes.  Oxygen to check today was 95% this is consistent with her previous office visits.  Past Medical, Surgical, Social History, Allergies, and Medications have been Reviewed.   Past Medical History:  Diagnosis Date  . Asthma   . Breast cancer ([place]) 2007   Stage I (T1b N0 M0), grade 1 well-differentiated carcinoma of the left breast status, post  lumpectomy followed by radiation therapy. Her estrogen receptor receptors were 93%, progesterone receptors 67%. HER-2/neu was negative. No lymphovascular space invasion was seen. All margins were clear. Ki-67 marker was low at 1% with surgery on 11/15/2004. Treated then with post-lumpectomy radiation, finish  . Breast cancer, left breast ([place]) 2007  . COPD (chronic obstructive pulmonary disease) ([place])   . Diabetes mellitus without complication ([place])   . Diverticula of colon   . Hyperlipidemia   . Hypertension   . Kidney stones   . Nicotine dependence   . Osteoarthritis    Past Surgical History:  Procedure Laterality Date  . ABDOMINAL HYSTERECTOMY    . BREAST SURGERY  2005 approx   left lumpectomy   . CHOLECYSTECTOMY N/A 05/26/2014   Procedure: LAPAROSCOPIC CHOLECYSTECTOMY;  Surgeon: Aviva Signs Md, MD;  Location: [facility] ORS;  Service: General;  Laterality: N/A;  . COLECTOMY     2005, diverticulitis  . COLONOSCOPY N/A 05/20/2017   Procedure: COLONOSCOPY;  Surgeon: Danie Binder, MD;  Location: [facility] END[facility] SUITE;  Service: Endoscopy;  Laterality: N/A;  1:45pm  . DILATION AND CURETTAGE OF UTERUS    . ESOPHAGOGASTRODUODENOSCOPY N/A 05/20/2017   Procedure: ESOPHAGOGASTRODUODENOSCOPY (EGD);  Surgeon: Danie Binder, MD;  Location: [facility];  Service: Endoscopy;  Laterality: N/A;  . left breast      cancer, in 2006  . TUBAL LIGATION     Social History   Socioeconomic History  . Marital  status: Single    Spouse name: Not on file  . Number of children: 5  . Years of education: 9th grade   . Highest education level: 9th grade  Occupational History  . Occupation: retired    Fish farm manager: RETIRED  Social Needs  . Financial resource strain: Not very hard  . Food insecurity:    Worry: Sometimes true    Inability: Sometimes true  . Transportation needs:    Medical: No    Non-medical: No  Tobacco Use  . Smoking status: Current Every Day Smoker    Packs/day: 1.00    Years: 50.00    Pack  years: 50.00    Types: Cigarettes  . Smokeless tobacco: Never Used  . Tobacco comment: cigaretes  now 10 to 15 per day  Substance and Sexual Activity  . Alcohol use: No    Alcohol/week: 0.0 standard drinks  . Drug use: No  . Sexual activity: Not Currently  Lifestyle  . Physical activity:    Days per week: 7 days    Minutes per session: 30 min  . Stress: Only a little  Relationships  . Social connections:    Talks on phone: Twice a week    Gets together: Never    Attends religious service: Never    Active member of club or organization: No    Attends meetings of clubs or organizations: Never    Relationship status: Divorced  . Intimate partner violence:    Fear of current or ex partner: No    Emotionally abused: No    Physically abused: No    Forced sexual activity: No  Other Topics Concern  . 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 QUIT WORKING 2003 AFTER CANCER DIAGNOSIS.      MARRIED WITH A RUN AWAY HUSBAND(FUGITIVE 10 YRS). 3 MISCARRIAGES, 5 CHILDREN    Outpatient Encounter Medications as of 06/20/2018  Medication Sig  . acetaminophen (TYLENOL) 325 MG tablet Take 650 mg by mouth every 6 (six) hours as needed.  Marland Kitchen albuterol (PROVENTIL) (2.5 MG/3ML) 0.083% nebulizer solution Take 2.5 mg by nebulization 3 (three) times daily as needed for wheezing or shortness of breath.  Marland Kitchen amLODipine (NORVASC) 5 MG tablet TAKE 1 TABLET BY MOUTH ONCE A DAY.  Marland Kitchen aspirin EC 81 MG tablet Take 81 mg by mouth daily.  . ATROVENT HFA 17 MCG/ACT inhaler INHALE 2 PUFFS BY MOUTH FOUR TIMES A DAY.  Marland Kitchen azelastine (ASTELIN) 0.1 % nasal spray PLACE 2 SPRAYS INTO EACH NOSTRIL TWICE DAILY.  . benazepril (LOTENSIN) 40 MG tablet TAKE ONE TABLET BY MOUTH DAILY.  . benzonatate (TESSALON PERLES) 100 MG capsule Take 2 capsules (200 mg total) by mouth 3 (three) times daily as needed for cough.  . budesonide-formoterol (SYMBICORT) 160-4.5 MCG/ACT inhaler  Inhale 2 puffs into the lungs 2 (two) times daily.  . Calcium Carbonate-Vitamin D (CALCIUM 600 + D PO) Take 1 tablet by mouth 2 (two) times daily. 600/400  . fluticasone (FLONASE) 50 MCG/ACT nasal spray Place 2 sprays into both nostrils daily.  Marland Kitchen loratadine (CLARITIN) 10 MG tablet TAKE 1 TABLET BY MOUTH ONCE DAILY FOR ALLERGIES.  Marland Kitchen montelukast (SINGULAIR) 10 MG tablet Take 1 tablet (10 mg total) by mouth at bedtime.  . Omega-3 Fatty Acids (FISH OIL) 1000 MG CAPS Take 1 capsule by mouth 2 (two) times daily.   . polyethylene glycol powder (GLYCOLAX/MIRALAX) powder MIX 1 CAPFUL IN 8 OUNCES OF JUICE OR  WATER AND DRINK ONCE DAILY.  Marland Kitchen potassium chloride SA (K-DUR,KLOR-CON) 20 MEQ tablet TAKE 1 TABLET BY MOUTH DAILY.  . simvastatin (ZOCOR) 20 MG tablet TAKE (1) TABLET BY MOUTH DAILY AT 6 PM FOR CHOLESTEROL.  Marland Kitchen tiZANidine (ZANAFLEX) 4 MG tablet One by mouth every night before bed as needed for spasm  . traMADol (ULTRAM) 50 MG tablet TAKE (1) TABLET BY MOUTH EVERY SIX HOURS AS NEEDED.   No facility-administered encounter medications on file as of 06/20/2018.    Allergies  Allergen Reactions  . Penicillins Hives, Shortness Of Breath and Swelling    Has patient had a PCN reaction causing immediate rash, facial/tongue/throat swelling, SOB or lightheadedness with hypotension: yes Has patient had a PCN reaction causing severe rash involving mucus membranes or skin necrosis:no Has patient had a PCN reaction that required hospitalization: yes Has patient had a PCN reaction occurring within the last 10 years: no If all of the above answers are "NO", then may proceed with Cephalosporin use.  . Sulfonamide Derivatives Other (See Comments)    Shaking all over, seizure like symptoms. Hospitalization resulted     Review of Systems  Constitutional: Negative for activity change, appetite change, chills and fever.  HENT: Negative.   Eyes: Negative for visual disturbance.  Respiratory: Positive for cough and  shortness of breath.        Baseline-smoking still   Cardiovascular: Negative for chest pain, palpitations and leg swelling.  Gastrointestinal: Negative.   Endocrine: Negative.   Genitourinary: Negative.   Musculoskeletal: Negative.   Skin: Negative.   Allergic/Immunologic: Negative.   Neurological: Negative for dizziness and headaches.  Hematological: Negative.   Psychiatric/Behavioral: Negative.   All other systems reviewed and are negative.      Objective:     BP 132/66   Pulse 91   Temp 98.3 F (36.8 C) (Oral)   Resp 14   Ht _0  (1.651 m)   Wt 175 lb 1.3 oz (79.4 kg)   SpO2 95%   BMI 29.13 kg/m   Physical Exam Vitals signs and nursing note reviewed.  Constitutional:      Appearance: Normal appearance. She is well-developed. She is obese.  HENT:     Head: Normocephalic and atraumatic.     Right Ear: External ear normal.     Left Ear: External ear normal.     Nose: Nose normal.  Eyes:     Conjunctiva/sclera: Conjunctivae normal.  Neck:     Musculoskeletal: Normal range of motion and neck supple.  Cardiovascular:     Rate and Rhythm: Normal rate and regular rhythm.     Pulses: Normal pulses.          Radial pulses are 2+ on the right side and 2+ on the left side.       Dorsalis pedis pulses are 2+ on the right side and 2+ on the left side.     Heart sounds: Normal heart sounds.  Pulmonary:     Effort: Pulmonary effort is normal.     Breath sounds: Examination of the right-lower field reveals decreased breath sounds. Examination of the left-lower field reveals decreased breath sounds. Decreased breath sounds present.  Musculoskeletal: Normal range of motion.     Right lower leg: No edema.     Left lower leg: No edema.  Skin:    General: Skin is warm and dry.  Neurological:     Mental Status: She is alert and oriented to person, place, and time.  Gait: Gait is intact.  Psychiatric:        Attention and Perception: Attention normal.        Mood and  Affect: Mood normal.        Speech: Speech normal.        Behavior: Behavior normal. Behavior is cooperative.        Thought Content: Thought content normal.        Judgment: Judgment normal.        Assessment and Plan       1. COPD (chronic obstructive pulmonary disease) with chronic bronchitis (HCC) COPD still present, exacerbation resolved.  Still unsure whether or not because her virus caused her to have a flare of this.  As she never got the testing performed.  Overall she is doing well with her oxygenation today in the office she is at her baseline that she has previously been at previous office visits.  She has no shortness of breath and communication.  She feels like she is doing much better.  2. Shortness of breath Much improved, back to her regular baseline of COPD shortness of breath.  Reports she is taking her inhalers as directed.  And has not had to use rescue inhalers this last couple days since completing her antibiotic.  3. NICOTINE ADDICTION Asked about quitting: confirms they are currently smokes cigarettes Advise to quit smoking: Educated about QUITTING to reduce the risk of cancer, cardio and cerebrovascular disease. Assess willingness: Unwilling to quit at this time, but is working on cutting back. Assist with counseling and pharmacotherapy: Counseled for 5 minutes and literature provided. Arrange for follow up: If not quitting follow up in 3 months and continue to offer help.    Follow up: 09/09/2018   Perlie Mayo, DNP, AGNP-BC [place], [place], [place] [address] Office Hours: Mon-Thurs 8 am-5 pm; Fri 8 am-12 pm Office Phone:  [phone]  Office Fax: [phone]

## 2018-06-20 NOTE — Patient Instructions (Signed)
Thank you for coming into the office today. I appreciate the opportunity to provide you with the care for your health and wellness. Today we discussed: Oxygen  You have a follow-up in August with Dr. Moshe Cipro.  If you need [place] before that please reach out  Today your oxygen level was consistent with your previous office visit.  I am so glad that the antibiotic that we sent in is helped you feel a lot better.  And that your bronchitis has calmed down.  Would like to encourage you to try to stop smoking.  I have attached some information for you to review on smoking cessation.  There is anything that Dr. Moshe Cipro or I can do to help you with this we are gladly available to support you in any fashion that we can.  Please continue to practice social distancing during this time to keep you, your family and our community safe.  If you must go out please continue to wear your mask as you did to the office today.  And practice good hand hygiene.   [place] YOUR HANDS WELL AND FREQUENTLY. AVOID TOUCHING YOUR FACE, UNLESS YOUR HANDS ARE FRESHLY WASHED.  GET FRESH AIR DAILY. STAY HYDRATED WITH WATER.   It was a pleasure to see you and I look forward to continuing to work together on your health and well-being. Please do not hesitate to call the office if you need care or have questions about your care.  Have a wonderful day and week. With Gratitude, Cherly Beach, DNP, AGNP-BC    Please think about quitting smoking.  This is very important for your health.  Consider setting a quit date, then cutting back or switching brands to prepare to stop.  Also think of the money you will save every day by not smoking.  Quick Tips to Quit Smoking: . Fix a date i.e. keep a date in mind from when you would not touch a tobacco product to smoke  . Keep yourself busy and block your mind with work loads or reading books or watching movies in malls where smoking is not allowed  . Vanish off the things which reminds  you about smoking for example match box, or your favorite lighter, or the pipe you used for smoking, or your favorite jeans and shirt with which you used to enjoy smoking, or the club where you used to do smoking  . Try to avoid certain people places and incidences where and with whom smoking is a common factor to add on  . Praise yourself with some token gifts from the money you saved by stopping smoking  . Anti Smoking teams are there to help you. Join their programs  . Anti-smoking Gums are there in many medical shops. Try them to quit smoking   Side-effects of Smoking: . Disease caused by smoking cigarettes are emphysema, bronchitis, heart failures  . Premature death  . Cancer is the major side effect of smoking  . Heart attacks and strokes are the quick effects of smoking causing sudden death  . Some smokers lives end up with limbs amputated  . Breathing problem or fast breathing is another side effect of smoking  . Due to more intakes of smokes, carbon mono-oxide goes into your brain and other muscles of the body which leads to swelling of the veins and blockage to the air passage to lungs  . Carbon monoxide blocks blood vessels which leads to blockage in the flow of blood to different  major body organs like heart lungs and thus leads to attacks and deaths  . During pregnancy smoking is very harmful and leads to premature birth of the infant, spontaneous abortions, low weight of the infant during birth  . Fat depositions to narrow and blocked blood vessels causing heart attacks  . In many cases cigarette smoking caused infertility in men

## 2018-07-07 ENCOUNTER — Other Ambulatory Visit: Payer: Self-pay | Admitting: Family Medicine

## 2018-07-08 ENCOUNTER — Ambulatory Visit: Payer: Medicare HMO | Admitting: Family Medicine

## 2018-07-08 ENCOUNTER — Ambulatory Visit (INDEPENDENT_AMBULATORY_CARE_PROVIDER_SITE_OTHER): Payer: Medicare HMO | Admitting: Family Medicine

## 2018-07-08 ENCOUNTER — Other Ambulatory Visit: Payer: Self-pay

## 2018-07-08 ENCOUNTER — Encounter: Payer: Self-pay | Admitting: Family Medicine

## 2018-07-08 DIAGNOSIS — H6690 Otitis media, unspecified, unspecified ear: Secondary | ICD-10-CM

## 2018-07-08 DIAGNOSIS — J029 Acute pharyngitis, unspecified: Secondary | ICD-10-CM

## 2018-07-08 MED ORDER — AZITHROMYCIN 250 MG PO TABS: ORAL_TABLET | ORAL | 0 refills | Status: DC

## 2018-07-08 NOTE — Patient Instructions (Addendum)
    Thank you for coming into the office today. I appreciate the opportunity to provide you with the care for your health and wellness. Today we discussed: ear ache and pharyngitis  FOLLOW UP: AS NEEDED  Please pick up your medications from the pharmacy and take as directed and complete the full course.  Salt water gargles and warm fluids can help with sore throat.  Tylenol as needed for headaches and ear pain.  Please call if symptoms return or do not resolve with medication.   PLEASE CONSIDER NOT SMOKING CESSATION  [place] YOUR HANDS WELL AND FREQUENTLY. AVOID TOUCHING YOUR FACE, UNLESS YOUR HANDS ARE FRESHLY WASHED.   GET FRESH AIR DAILY. STAY HYDRATED WITH WATER.   It was a pleasure to see you and I look forward to continuing to work together on your health and well-being. Please do not hesitate to call the office if you need care or have questions about your care.  Have a wonderful day and week. With Gratitude, Cherly Beach, DNP, AGNP-BC

## 2018-07-08 NOTE — Progress Notes (Signed)
Virtual Visit via Telephone Note   This visit type was conducted due to national recommendations for restrictions regarding the COVID-19 Pandemic (e.g. social distancing) in an effort to limit this patient's exposure and mitigate transmission in our community.  Due to her co-morbid illnesses, this patient is at least at moderate risk for complications without adequate follow up.  This format is felt to be most appropriate for this patient at this time.  The patient did not have access to video technology/had technical difficulties with video requiring transitioning to audio format only (telephone).  All issues noted in this document were discussed and addressed.  No physical exam could be performed with this format.    Evaluation Performed:  Follow-up visit  Date:  07/08/2018   ID:  [name], [name] May 01, 1945, MRN [id number]  Patient Location: Home Provider Location: Office  Location of Patient: Home Location of Provider: Telehealth Consent was obtain for visit to be over via telehealth. I verified that I am speaking with the correct person using two identifiers.  PCP:  [name], MD   Chief Complaint:  Ear pain   History of Present Illness:    [name] is a 73 y.o. female with acute complaints of left ear pain, sore throat, hoareness. Onset was Friday evening. Mild ear ache as well. Denies hearing loss. Reports yellow drainage from nose. Cough and Shortness of breath are at her baseline COPD. Tried tylenol for pain no relief. Reports taking her allergy medication. Does not think this is just allergy related.  Past Medical, Surgical, Social History, Allergies, and Medications have been Reviewed.  Past Medical History:  Diagnosis Date  . Asthma   . Breast cancer ([place]) 2007   Stage I (T1b N0 M0), grade 1 well-differentiated carcinoma of the left breast status, post lumpectomy followed by radiation therapy. Her estrogen receptor receptors were 93%,  progesterone receptors 67%. HER-2/neu was negative. No lymphovascular space invasion was seen. All margins were clear. Ki-67 marker was low at 1% with surgery on 11/15/2004. Treated then with post-lumpectomy radiation, finish  . Breast cancer, left breast ([place]) 2007  . COPD (chronic obstructive pulmonary disease) ([place])   . Diabetes mellitus without complication ([place])   . Diverticula of colon   . Hyperlipidemia   . Hypertension   . Kidney stones   . Nicotine dependence   . Osteoarthritis    Past Surgical History:  Procedure Laterality Date  . ABDOMINAL HYSTERECTOMY    . BREAST SURGERY  2005 approx   left lumpectomy   . CHOLECYSTECTOMY N/A 05/26/2014   Procedure: LAPAROSCOPIC CHOLECYSTECTOMY;  Surgeon: Aviva Signs Md, MD;  Location: [facility];  Service: General;  Laterality: N/A;  . COLECTOMY     2005, diverticulitis  . COLONOSCOPY N/A 05/20/2017   Procedure: COLONOSCOPY;  Surgeon: Danie Binder, MD;  Location: [facility];  Service: Endoscopy;  Laterality: N/A;  1:45pm  . DILATION AND CURETTAGE OF UTERUS    . ESOPHAGOGASTRODUODENOSCOPY N/A 05/20/2017   Procedure: ESOPHAGOGASTRODUODENOSCOPY (EGD);  Surgeon: Danie Binder, MD;  Location: [facility];  Service: Endoscopy;  Laterality: N/A;  . left breast      cancer, in 2006  . TUBAL LIGATION       Current Meds  Medication Sig  . acetaminophen (TYLENOL) 325 MG tablet Take 650 mg by mouth every 6 (six) hours as needed.  Marland Kitchen albuterol (PROVENTIL) (2.5 MG/3ML) 0.083% nebulizer solution INHALE ONE VIAL VIA NEBULIZER THREE TIMES DAILY.  Marland Kitchen  amLODipine (NORVASC) 5 MG tablet TAKE 1 TABLET BY MOUTH ONCE A DAY.  Marland Kitchen aspirin EC 81 MG tablet Take 81 mg by mouth daily.  . ATROVENT HFA 17 MCG/ACT inhaler INHALE 2 PUFFS BY MOUTH FOUR TIMES A DAY.  Marland Kitchen azelastine (ASTELIN) 0.1 % nasal spray PLACE 2 SPRAYS INTO EACH NOSTRIL TWICE DAILY.  . benazepril (LOTENSIN) 40 MG tablet TAKE ONE TABLET BY MOUTH DAILY.  . benzonatate (TESSALON PERLES) 100 MG capsule  Take 2 capsules (200 mg total) by mouth 3 (three) times daily as needed for cough.  . budesonide-formoterol (SYMBICORT) 160-4.5 MCG/ACT inhaler Inhale 2 puffs into the lungs 2 (two) times daily.  . Calcium Carbonate-Vitamin D (CALCIUM 600 + D PO) Take 1 tablet by mouth 2 (two) times daily. 600/400  . fluticasone (FLONASE) 50 MCG/ACT nasal spray Place 2 sprays into both nostrils daily.  Marland Kitchen loratadine (CLARITIN) 10 MG tablet TAKE 1 TABLET BY MOUTH ONCE DAILY FOR ALLERGIES.  Marland Kitchen montelukast (SINGULAIR) 10 MG tablet Take 1 tablet (10 mg total) by mouth at bedtime.  . Omega-3 Fatty Acids (FISH OIL) 1000 MG CAPS Take 1 capsule by mouth 2 (two) times daily.   . polyethylene glycol powder (GLYCOLAX/MIRALAX) powder MIX 1 CAPFUL IN 8 OUNCES OF JUICE OR WATER AND DRINK ONCE DAILY.  Marland Kitchen potassium chloride SA (K-DUR,KLOR-CON) 20 MEQ tablet TAKE 1 TABLET BY MOUTH DAILY.  . simvastatin (ZOCOR) 20 MG tablet TAKE (1) TABLET BY MOUTH DAILY AT 6 PM FOR CHOLESTEROL.  Marland Kitchen tiZANidine (ZANAFLEX) 4 MG tablet One by mouth every night before bed as needed for spasm  . traMADol (ULTRAM) 50 MG tablet TAKE (1) TABLET BY MOUTH EVERY SIX HOURS AS NEEDED.     Allergies:   Penicillins and Sulfonamide derivatives   Social History   Tobacco Use  . Smoking status: Current Every Day Smoker    Packs/day: 1.00    Years: 50.00    Pack years: 50.00    Types: Cigarettes  . Smokeless tobacco: Never Used  . Tobacco comment: cigaretes  now 10 to 15 per day  Substance Use Topics  . Alcohol use: No    Alcohol/week: 0.0 standard drinks  . Drug use: No     Family Hx: The patient's family history includes Breast cancer in her mother; COPD in her father; Emphysema in her father; Glaucoma in her brother; Heart attack in her brother; Hepatitis C in her daughter; Lung cancer in her sister; SIDS in her son; Schizophrenia in her brother; Seizures in her daughter; Throat cancer in her sister. There is no history of Colon cancer or Colon  polyps.  ROS:   Please see the history of present illness.  Review of Systems  Constitutional: Negative for chills and fever.  HENT: Positive for congestion, ear pain, sinus pressure, sinus pain, sneezing, sore throat, trouble swallowing and voice change.   Eyes: Positive for itching. Negative for pain, discharge, redness and visual disturbance.  Respiratory: Negative for cough and shortness of breath.   Cardiovascular: Negative.   Gastrointestinal: Negative.   Endocrine: Negative.   Genitourinary: Negative.   Musculoskeletal: Negative.   Skin: Negative.   Allergic/Immunologic: Positive for environmental allergies.  Neurological: Positive for headaches.  Hematological: Negative.   Psychiatric/Behavioral: Negative.       All other systems reviewed and are negative.   Labs/Other Tests and Data Reviewed:    Recent Labs: 09/17/2017: Hemoglobin 14.1; Platelets 331 01/24/2018: ALT 11; BUN 22; Creat 0.74; Potassium 4.6; Sodium 141; TSH 1.49  Recent Lipid Panel Lab Results  Component Value Date/Time   CHOL 188 01/24/2018 11:32 AM   TRIG 130 01/24/2018 11:32 AM   HDL 71 01/24/2018 11:32 AM   CHOLHDL 2.6 01/24/2018 11:32 AM   LDLCALC 94 01/24/2018 11:32 AM    Wt Readings from Last 3 Encounters:  06/20/18 175 lb 1.3 oz (79.4 kg)  06/12/18 163 lb (73.9 kg)  05/03/18 157 lb (71.2 kg)     Objective:    Vital Signs:  There were no vitals taken for this visit.   GEN:  alert and oriented RESPIRATORY:  no noted shortness of breath during conversation PSYCH:  normal affect, mood, good communication  noted hoareness in conversation  ASSESSMENT & PLAN:    1. Pharyngitis, unspecified etiology Def voice change compared to previous visits. Unable to see in office today. Phone visit limited visual of throat. Pt is a smoker and this is a risk factor for pharyngitis. Zpack given  - azithromycin (ZITHROMAX Z-PAK) 250 MG tablet; Please take 2 tablets on day 1 followed by 1 tablet daily  on days 2 to 5.  Dispense: 6 each; Refill: 0  2. Acute otitis media, unspecified otitis media type Due to inability to visually see ear and possible ear infection will provide coverage.   - azithromycin (ZITHROMAX Z-PAK) 250 MG tablet; Please take 2 tablets on day 1 followed by 1 tablet daily on days 2 to 5.  Dispense: 6 each; Refill: 0    Time:   Today, I have spent 10 minutes with the patient with telehealth technology discussing the above problems.     Medication Adjustments/Labs and Tests Ordered: Current medicines are reviewed at length with the patient today.  Concerns regarding medicines are outlined above.   Tests Ordered: No orders of the defined types were placed in this encounter.   Medication Changes: Meds ordered this encounter  Medications  . azithromycin (ZITHROMAX Z-PAK) 250 MG tablet    Sig: Please take 2 tablets on day 1 followed by 1 tablet daily on days 2 to 5.    Dispense:  6 each    Refill:  0    Order Specific Question:   Supervising Provider    Answer:   [name] [[id number]]    Disposition:  Follow up as needed or if symptoms fail to improve  Signed, Perlie Mayo, NP  07/08/2018 4:19 PM     [facility]

## 2018-07-17 ENCOUNTER — Other Ambulatory Visit: Payer: Self-pay | Admitting: Family Medicine

## 2018-07-17 ENCOUNTER — Telehealth: Payer: Self-pay | Admitting: *Deleted

## 2018-07-17 MED ORDER — PREDNISONE 5 MG PO TABS: 5.0000 mg | ORAL_TABLET | Freq: Two times a day (BID) | ORAL | 0 refills | Status: AC

## 2018-07-17 MED ORDER — FIRST-DUKES MOUTHWASH MT SUSP: OROMUCOSAL | 0 refills | Status: DC

## 2018-07-17 NOTE — Telephone Encounter (Signed)
Pt called she did a phone visit for losing her voice at the end of June. She has finished her zpack but she is no better. She is still losing her voice still has sore throat. She has been gargling with warm salt water and warm coffee to try to soothe it but it is no better. She does not know if she has a fever as she take tylenol all the time. She can be reached at either number if her granddaughter [name] answers can leave a message with her

## 2018-07-17 NOTE — Telephone Encounter (Signed)
For voice loss likely dueGERD   I am prescribeing dukes for symptom relief and 5 days prednisone, to help with inflammation of larynx

## 2018-07-17 NOTE — Progress Notes (Signed)
pred 

## 2018-07-20 ENCOUNTER — Telehealth: Payer: Self-pay | Admitting: Family Medicine

## 2018-07-20 NOTE — Telephone Encounter (Signed)
Pt called 7/3 and left message.  She has sore throat and raspatory problems.  I told her to got to ER today if needed or follow up at urgent care tomorrow since she has Covid symptoms.  She will go to [facility] tomorrow.

## 2018-07-21 ENCOUNTER — Other Ambulatory Visit: Payer: Medicare HMO

## 2018-07-21 ENCOUNTER — Other Ambulatory Visit: Payer: Self-pay | Admitting: Internal Medicine

## 2018-07-21 DIAGNOSIS — Z20822 Contact with and (suspected) exposure to covid-19: Secondary | ICD-10-CM

## 2018-07-21 DIAGNOSIS — R6889 Other general symptoms and signs: Secondary | ICD-10-CM | POA: Diagnosis not present

## 2018-07-21 NOTE — Telephone Encounter (Signed)
Spoke with patient and she got the prednisone but couldn't afford the Dukes mouth wash. She stated she will use Scope mouthwash and warm salt water to rinse with.

## 2018-07-21 NOTE — Progress Notes (Signed)
lab

## 2018-07-24 ENCOUNTER — Encounter: Payer: Self-pay | Admitting: Family Medicine

## 2018-07-24 ENCOUNTER — Other Ambulatory Visit: Payer: Self-pay

## 2018-07-24 ENCOUNTER — Ambulatory Visit (INDEPENDENT_AMBULATORY_CARE_PROVIDER_SITE_OTHER): Payer: Medicare Other | Admitting: Family Medicine

## 2018-07-24 VITALS — BP 132/66 | Ht 64.0 in | Wt 164.0 lb

## 2018-07-24 DIAGNOSIS — J209 Acute bronchitis, unspecified: Secondary | ICD-10-CM | POA: Diagnosis not present

## 2018-07-24 DIAGNOSIS — I10 Essential (primary) hypertension: Secondary | ICD-10-CM

## 2018-07-24 DIAGNOSIS — J040 Acute laryngitis: Secondary | ICD-10-CM | POA: Diagnosis not present

## 2018-07-24 DIAGNOSIS — J0100 Acute maxillary sinusitis, unspecified: Secondary | ICD-10-CM | POA: Diagnosis not present

## 2018-07-24 MED ORDER — PREDNISONE 10 MG PO TABS: 10.0000 mg | ORAL_TABLET | Freq: Two times a day (BID) | ORAL | 0 refills | Status: DC

## 2018-07-24 MED ORDER — LEVOFLOXACIN 500 MG PO TABS: 500.0000 mg | ORAL_TABLET | Freq: Every day | ORAL | 0 refills | Status: DC

## 2018-07-24 NOTE — Progress Notes (Signed)
Virtual Visit via Telephone Note  I connected with [name] on 07/24/18 at 11:00 AM EDT by telephone and verified that I am speaking with the correct person using two identifiers.  Location: Patient: home Provider: office   I discussed the limitations, risks, security and privacy concerns of performing an evaluation and management service by telephone and the availability of in person appointments. I also discussed with the patient that there may be a patient responsible charge related to this service. The patient expressed understanding and agreed to proceed.I connected with  [name] on 07/24/18 by a video enabled telemedicine application and verified that I am speaking with the correct person using two identifiers.   I discussed the limitations of evaluation and management by telemedicine. The patient expressed understanding and agreed to proceed.    History of Present Illness:   c/o cough, shortness of breath and sore throat for 5 weeks also chills  Yellow green sputum, nasal drainage, facial pressure Denies chest pains, palpitations and leg swelling Denies abdominal pain, nausea, vomiting,diarrhea or constipation.   Denies dysuria, frequency, hesitancy has incontinence.  Denies joint pain, swelling and limitation in mobility. Denies headaches, seizures, numbness, or tingling. Denies depression, uncontrolled nxiety or insomnia. Denies skin break down or rash.     Observations/Objective:  BP 132/66   Ht 5\' 4"  (1.626 m)   Wt 164 lb (74.4 kg)   BMI 28.15 kg/m  Good communication with no confusion and intact memory. Alert and oriented x 3  signs of respiratory distress during speech, coughing , hoarse and short of breath with speech   Assessment and Plan: Acute sinusitis 3 week h/o recurrent /persistent symptoms, levaquin prescribed  Acute bronchitis Levaquin and prednisone prescribed  Acute laryngitis Prednisone prescribed, voice rest and salt water  gargles recommended    Follow Up Instructions:    I discussed the assessment and treatment plan with the patient. The patient was provided an opportunity to ask questions and all were answered. The patient agreed with the plan and demonstrated an understanding of the instructions.   The patient was advised to call back or seek an in-person evaluation if the symptoms worsen or if the condition fails to improve as anticipated.  I provided 15 minutes of non-face-to-face time during this encounter.   [name], MD

## 2018-07-24 NOTE — Patient Instructions (Signed)
F/U in 2 weeks, with MD , call if you need me sooner  You are treated for bronchitis and sinusitis with levaquin and prednisone    Ensure you drink sufficient fluids, and get a lot of rest .  You may use tylenol for pain and fever   I hope that you feel better soon

## 2018-07-26 LAB — NOVEL CORONAVIRUS, NAA: SARS-CoV-2, NAA: NOT DETECTED

## 2018-07-27 ENCOUNTER — Encounter: Payer: Self-pay | Admitting: Family Medicine

## 2018-07-27 DIAGNOSIS — J040 Acute laryngitis: Secondary | ICD-10-CM | POA: Insufficient documentation

## 2018-07-27 NOTE — Assessment & Plan Note (Addendum)
Prednisone prescribed, voice rest and salt water gargles recommended

## 2018-07-27 NOTE — Assessment & Plan Note (Signed)
Levaquin and prednisone prescribed

## 2018-07-27 NOTE — Assessment & Plan Note (Signed)
3 week h/o recurrent /persistent symptoms, levaquin prescribed

## 2018-07-27 NOTE — Assessment & Plan Note (Signed)
Controlled, no change in medication  

## 2018-08-07 ENCOUNTER — Other Ambulatory Visit (HOSPITAL_COMMUNITY)
Admission: RE | Admit: 2018-08-07 | Discharge: 2018-08-07 | Disposition: A | Discharge status: Home / Self Care | Payer: Medicare Other | Source: Ambulatory Visit | Attending: Family Medicine | Admitting: Family Medicine

## 2018-08-07 ENCOUNTER — Ambulatory Visit (INDEPENDENT_AMBULATORY_CARE_PROVIDER_SITE_OTHER): Payer: Medicare Other | Admitting: Family Medicine

## 2018-08-07 ENCOUNTER — Encounter: Payer: Self-pay | Admitting: Family Medicine

## 2018-08-07 ENCOUNTER — Other Ambulatory Visit: Payer: Self-pay

## 2018-08-07 ENCOUNTER — Ambulatory Visit (HOSPITAL_COMMUNITY)
Admission: RE | Admit: 2018-08-07 | Discharge: 2018-08-07 | Disposition: A | Discharge status: Home / Self Care | Payer: Medicare Other | Source: Ambulatory Visit | Attending: Family Medicine | Admitting: Family Medicine

## 2018-08-07 ENCOUNTER — Telehealth: Payer: Self-pay | Admitting: Orthopaedic Surgery

## 2018-08-07 VITALS — BP 132/66 | HR 91 | Resp 14 | Ht 65.0 in | Wt 167.0 lb

## 2018-08-07 DIAGNOSIS — F17200 Nicotine dependence, unspecified, uncomplicated: Secondary | ICD-10-CM

## 2018-08-07 DIAGNOSIS — I10 Essential (primary) hypertension: Secondary | ICD-10-CM | POA: Insufficient documentation

## 2018-08-07 DIAGNOSIS — F17210 Nicotine dependence, cigarettes, uncomplicated: Secondary | ICD-10-CM

## 2018-08-07 DIAGNOSIS — R490 Dysphonia: Secondary | ICD-10-CM | POA: Insufficient documentation

## 2018-08-07 DIAGNOSIS — J0100 Acute maxillary sinusitis, unspecified: Secondary | ICD-10-CM | POA: Diagnosis not present

## 2018-08-07 DIAGNOSIS — J040 Acute laryngitis: Secondary | ICD-10-CM

## 2018-08-07 DIAGNOSIS — R059 Cough, unspecified: Secondary | ICD-10-CM

## 2018-08-07 DIAGNOSIS — R05 Cough: Secondary | ICD-10-CM | POA: Insufficient documentation

## 2018-08-07 DIAGNOSIS — J329 Chronic sinusitis, unspecified: Secondary | ICD-10-CM

## 2018-08-07 DIAGNOSIS — E785 Hyperlipidemia, unspecified: Secondary | ICD-10-CM | POA: Insufficient documentation

## 2018-08-07 DIAGNOSIS — J209 Acute bronchitis, unspecified: Secondary | ICD-10-CM

## 2018-08-07 DIAGNOSIS — R0602 Shortness of breath: Secondary | ICD-10-CM | POA: Diagnosis not present

## 2018-08-07 LAB — BASIC METABOLIC PANEL
Anion gap: 10 (ref 5–15)
BUN: 19 mg/dL (ref 8–23)
CO2: 25 mmol/L (ref 22–32)
Calcium: 9.8 mg/dL (ref 8.9–10.3)
Chloride: 105 mmol/L (ref 98–111)
Creatinine, Ser: 0.79 mg/dL (ref 0.44–1.00)
GFR calc Af Amer: >60 mL/min (ref >60)
GFR calc non Af Amer: >60 mL/min (ref >60)
Glucose, Bld: 96 mg/dL (ref 70–99)
Potassium: 4.9 mmol/L (ref 3.5–5.1)
Sodium: 140 mmol/L (ref 135–145)

## 2018-08-07 LAB — CBC
HCT: 47.8 % — ABNORMAL HIGH (ref 36.0–46.0)
Hemoglobin: 14.6 g/dL (ref 12.0–15.0)
MCH: 28.7 pg (ref 26.0–34.0)
MCHC: 30.5 g/dL (ref 30.0–36.0)
MCV: 94.1 fL (ref 80.0–100.0)
Platelets: 355 10*3/uL (ref 150–400)
RBC: 5.08 MIL/uL (ref 3.87–5.11)
RDW: 14.3 % (ref 11.5–15.5)
WBC: 11.6 10*3/uL — ABNORMAL HIGH (ref 4.0–10.5)
nRBC: 0 % (ref 0.0–0.2)

## 2018-08-07 NOTE — Assessment & Plan Note (Addendum)
1 month h/o persistent and worsening hoarseness, cann barely speak despite 2 antibiotics

## 2018-08-07 NOTE — Patient Instructions (Signed)
F/U in 2 month, call if you need me before You are referred to dr Benjamine Mola because of chronic hoarseness  cBC and diff and chem 7 and EGr at hospital today  cXR and sinus X ray at hospital today and sputum for culture

## 2018-08-07 NOTE — Telephone Encounter (Signed)
Patient called to relay she has had an Xray and other tests ordered by her primary care, Dr Moshe Cipro. Asked if Dr Luna Glasgow can review and advise; however, Xrays appear to be unrelated to orthopaedics.  Patient also requested to confirm her appointment for Tuesday, 08/12/18, which she is likely going to need to re-schedule due to having cough, hoarseness, and not feeling well in general. Phone visit offered rather than in-person visit. patient asked to hold on re-scheduling until Dr Luna Glasgow advises.

## 2018-08-07 NOTE — Progress Notes (Signed)
Virtual Visit via Telephone Note  I connected with [name] on 08/07/18 at 10:00 AM EDT by telephone and verified that I am speaking with the correct person using two identifiers.  Location: Patient: home Provider: office   I discussed the limitations, risks, security and privacy concerns of performing an evaluation and management service by telephone and the availability of in person appointments. I also discussed with the patient that there may be a patient responsible charge related to this service. The patient expressed understanding and agreed to proceed.  This visit type is conducted due to national recommendations for restrictions regarding the COVID -19 Pandemic. Due to the patient's age and / or co morbidities, this format is felt to be most appropriate at this time without adequate follow up. The patient has no access to video technology/ had technical difficulties with video, requiring transitioning to audio format  only ( telephone ). All issues noted this document were discussed and addressed,no physical exam can be performed in this format.    History of Present Illness: 2 month h/o worsening hoarseness and cough, taes symptoms wax and wanre briefly but overall seems to be worsening  C/o fatigue from excess cough and chronic symptoms, sputum is thick and yellow, sinus drainage is thick and yellow. Apetite is poor . Denies chest pains, palpitations and leg swelling Denies abdominal pain, nausea, vomiting,diarrhea or constipation.   Denies dysuria, frequency, hesitancy or incontinence. Denies joint pain, swelling and limitation in mobility. Denies headaches, seizures, numbness, or tingling. Denies depression, anxiety or insomnia. Denies skin break down or rash.       Observations/Objective: BP 132/66   Pulse 91   Resp 14   Ht 5\' 5"  (1.651 m)   Wt 167 lb (75.8 kg)   BMI 27.79 kg/m   Unable to hear or understand p and history from her grandaughter, due to  loss of voice   Assessment and Plan:  Hoarseness of voice 1 month h/o persistent and worsening hoarseness, cann barely speak despite 2 antibiotics  Acute sinusitis Acute on chronic sinusits , no improvement despite antibiotic course, Xray sinus asap, refer to ENT also  Acute laryngitis 2 week h/o progressive loss of voice, current smoker, refer to ENT asap  Acute bronchitis 2 week h/o cough and congestion, CXr and sputum testing  NICOTINE ADDICTION Asked:confirms currently smokes cigarettes Assess: Unwilling to quit but cutting back Advise: needs to QUIT to reduce risk of cancer, cardio and cerebrovascular disease Assist: counseled for 5 minutes and literature provided Arrange: follow up in 3 months    Follow Up Instructions:    I discussed the assessment and treatment plan with the patient. The patient was provided an opportunity to ask questions and all were answered. The patient agreed with the plan and demonstrated an understanding of the instructions.   The patient was advised to call back or seek an in-person evaluation if the symptoms worsen or if the condition fails to improve as anticipated.  I provided 25 minutes of non-face-to-face time during this encounter.   [name], MD

## 2018-08-10 ENCOUNTER — Encounter: Payer: Self-pay | Admitting: Family Medicine

## 2018-08-10 LAB — CULTURE, RESPIRATORY W GRAM STAIN: Culture: NORMAL

## 2018-08-10 NOTE — Assessment & Plan Note (Signed)
Controlled, no change in medication DASH diet and commitment to daily physical activity for a minimum of 30 minutes discussed and encouraged, as a part of hypertension management. The importance of attaining a healthy weight is also discussed.  BP/Weight 08/07/2018 07/24/2018 06/20/2018 06/12/2018 05/03/2018 03/04/2018 8/52/7782  Systolic BP 423 536 144 - 315 400 867  Diastolic BP 66 66 66 - 73 68 70  Wt. (Lbs) 167 164 175.08 163 157 170 169.4  BMI 27.79 28.15 29.13 27.12 26.13 29.18 29.08

## 2018-08-10 NOTE — Assessment & Plan Note (Signed)
Asked:confirms currently smokes cigarettes Assess: Unwilling to quit but cutting back Advise: needs to QUIT to reduce risk of cancer, cardio and cerebrovascular disease Assist: counseled for 5 minutes and literature provided Arrange: follow up in 3 months  

## 2018-08-10 NOTE — Assessment & Plan Note (Signed)
Acute on chronic sinusits , no improvement despite antibiotic course, Xray sinus asap, refer to ENT also

## 2018-08-10 NOTE — Assessment & Plan Note (Signed)
2 week h/o cough and congestion, CXr and sputum testing

## 2018-08-10 NOTE — Assessment & Plan Note (Signed)
2 week h/o progressive loss of voice, current smoker, refer to ENT asap

## 2018-08-12 ENCOUNTER — Ambulatory Visit: Payer: Medicare HMO | Admitting: Orthopaedic Surgery

## 2018-08-12 NOTE — Telephone Encounter (Signed)
Called back to patient to relay; had spoken with our office prior to today and re-scheduled her appointment to a virtual/telephone visit.

## 2018-08-12 NOTE — Telephone Encounter (Signed)
Will see when she is feeling better with no symptoms.

## 2018-08-13 ENCOUNTER — Ambulatory Visit (INDEPENDENT_AMBULATORY_CARE_PROVIDER_SITE_OTHER): Payer: Medicare Other | Admitting: Orthopaedic Surgery

## 2018-08-13 ENCOUNTER — Other Ambulatory Visit: Payer: Self-pay

## 2018-08-13 ENCOUNTER — Encounter: Payer: Self-pay | Admitting: Orthopaedic Surgery

## 2018-08-13 DIAGNOSIS — M79604 Pain in right leg: Secondary | ICD-10-CM

## 2018-08-13 DIAGNOSIS — M545 Low back pain: Secondary | ICD-10-CM | POA: Diagnosis not present

## 2018-08-13 DIAGNOSIS — M25551 Pain in right hip: Secondary | ICD-10-CM | POA: Diagnosis not present

## 2018-08-13 DIAGNOSIS — G8929 Other chronic pain: Secondary | ICD-10-CM

## 2018-08-13 MED ORDER — TRAMADOL HCL 50 MG PO TABS: ORAL_TABLET | ORAL | 3 refills | Status: DC

## 2018-08-13 NOTE — Progress Notes (Signed)
Virtual Visit via Telephone Note  I connected with@ on 08/13/18 at  9:40 AM EDT by telephone and verified that I am speaking with the correct person using two identifiers.  Location: Patient: home Provider: home   I discussed the limitations, risks, security and privacy concerns of performing an evaluation and management service by telephone and the availability of in person appointments. I also discussed with the patient that there may be a patient responsible charge related to this service. The patient expressed understanding and agreed to proceed.   History of Present Illness: She has chronic lower back pain that is stable. She has good and bad days.  She has no weakness or new trauma. She is taking her medicine.  I will call in refill on Tramadol.  She is trying to be active.   Observations/Objective: Per above.  Assessment and Plan: Encounter Diagnoses  Name Primary?  . Low back pain radiating to right lower extremity Yes  . Chronic pain of right hip      Follow Up Instructions: Two months.  I have reviewed the [place] web site prior to prescribing narcotic medicine for this patient.      I discussed the assessment and treatment plan with the patient. The patient was provided an opportunity to ask questions and all were answered. The patient agreed with the plan and demonstrated an understanding of the instructions.   The patient was advised to call back or seek an in-person evaluation if the symptoms worsen or if the condition fails to improve as anticipated.  I provided 9 minutes of non-face-to-face time during this encounter.   Sanjuana Kava, MD

## 2018-08-25 ENCOUNTER — Other Ambulatory Visit: Payer: Self-pay | Admitting: Family Medicine

## 2018-08-26 ENCOUNTER — Encounter: Payer: Self-pay | Admitting: Family Medicine

## 2018-08-26 ENCOUNTER — Ambulatory Visit (INDEPENDENT_AMBULATORY_CARE_PROVIDER_SITE_OTHER): Payer: Medicare Other | Admitting: Family Medicine

## 2018-08-26 ENCOUNTER — Other Ambulatory Visit: Payer: Self-pay

## 2018-08-26 VITALS — Ht 64.0 in | Wt 167.0 lb

## 2018-08-26 DIAGNOSIS — R490 Dysphonia: Secondary | ICD-10-CM | POA: Diagnosis not present

## 2018-08-26 DIAGNOSIS — J040 Acute laryngitis: Secondary | ICD-10-CM | POA: Diagnosis not present

## 2018-08-26 NOTE — Patient Instructions (Addendum)
    Thank you for coming into the office today. I appreciate the opportunity to provide you with the care for your health and wellness. Today we discussed: hoarseness  Follow Up/next appt: 09/09/2018  No labs today  ENT APPT: Aug 20, at 1:40pm Please keep this appt.  Start taking the omeprazole daily  Work to reduce smoking  Please continue to practice social distancing to keep you, your family, and our community safe.  If you must go out, please wear a Mask and practice good handwashing.  [place] YOUR HANDS WELL AND FREQUENTLY. AVOID TOUCHING YOUR FACE, UNLESS YOUR HANDS ARE FRESHLY WASHED.  GET FRESH AIR DAILY. STAY HYDRATED WITH WATER.   It was a pleasure to see you and I look forward to continuing to work together on your health and well-being. Please do not hesitate to call the office if you need care or have questions about your care.  Have a wonderful day and week.  With Gratitude,  Cherly Beach, DNP, AGNP-BC

## 2018-08-26 NOTE — Progress Notes (Signed)
Virtual Visit via Telephone Note   This visit type was conducted due to national recommendations for restrictions regarding the COVID-19 Pandemic (e.g. social distancing) in an effort to limit this patient's exposure and mitigate transmission in our community.  Due to her co-morbid illnesses, this patient is at least at moderate risk for complications without adequate follow up.  This format is felt to be most appropriate for this patient at this time.  The patient did not have access to video technology/had technical difficulties with video requiring transitioning to audio format only (telephone).  All issues noted in this document were discussed and addressed.  No physical exam could be performed with this format.    Evaluation Performed:  Follow-up visi  Date:  08/26/2018   ID:  [name], DOB [birth year], MRN [id number]  Patient Location: Home Provider Location: Office  Location of Patient: Home Location of Provider: Telehealth Consent was obtain for visit to be over via telehealth. I verified that I am speaking with the correct person using two identifiers.  PCP:  [name], MD   Chief Complaint:    History of Present Illness:    [name] is a 73 y.o. female with  On going pharyngitis/laryngitis, cough even post antibiotics course x two. Chest xray was clear. Sinus xray was unremarkable as well. Referral to Dr Anthony Sar previously by Dr Moshe Cipro, she reports not have been called by the office yet.  She is feeling bad. Tired from coughing and overall run down. History of smoking, is still smoking.   The patient does not have symptoms concerning for COVID-19 infection (fever, chills, cough, or new shortness of breath).   Past Medical, Surgical, Social History, Allergies, and Medications have been Reviewed.   Past Medical History:  Diagnosis Date   Asthma    Breast cancer ([place]) 2007   Stage I (T1b N0 M0), grade 1 well-differentiated carcinoma of the  left breast status, post lumpectomy followed by radiation therapy. Her estrogen receptor receptors were 93%, progesterone receptors 67%. HER-2/neu was negative. No lymphovascular space invasion was seen. All margins were clear. Ki-67 marker was low at 1% with surgery on 11/15/2004. Treated then with post-lumpectomy radiation, finish   Breast cancer, left breast ([place]) 2007   COPD (chronic obstructive pulmonary disease) ([place])    Diabetes mellitus without complication ([place])    Diverticula of colon    Hyperlipidemia    Hypertension    Kidney stones    Nicotine dependence    Osteoarthritis    Past Surgical History:  Procedure Laterality Date   ABDOMINAL HYSTERECTOMY     BREAST SURGERY  2005 approx   left lumpectomy    CHOLECYSTECTOMY N/A 05/26/2014   Procedure: LAPAROSCOPIC CHOLECYSTECTOMY;  Surgeon: Aviva Signs Md, MD;  Location: [facility];  Service: General;  Laterality: N/A;   COLECTOMY     2005, diverticulitis   COLONOSCOPY N/A 05/20/2017   Procedure: COLONOSCOPY;  Surgeon: Danie Binder, MD;  Location: [facility];  Service: Endoscopy;  Laterality: N/A;  1:45pm   DILATION AND CURETTAGE OF UTERUS     ESOPHAGOGASTRODUODENOSCOPY N/A 05/20/2017   Procedure: ESOPHAGOGASTRODUODENOSCOPY (EGD);  Surgeon: Danie Binder, MD;  Location: [facility];  Service: Endoscopy;  Laterality: N/A;   left breast      cancer, in 2006   TUBAL LIGATION       Current Meds  Medication Sig   acetaminophen (TYLENOL) 325 MG tablet Take 650 mg by mouth every  6 (six) hours as needed.   albuterol (PROVENTIL) (2.5 MG/3ML) 0.083% nebulizer solution INHALE ONE VIAL VIA NEBULIZER THREE TIMES DAILY.   amLODipine (NORVASC) 5 MG tablet TAKE 1 TABLET BY MOUTH ONCE A DAY.   aspirin EC 81 MG tablet Take 81 mg by mouth daily.   ATROVENT HFA 17 MCG/ACT inhaler INHALE 2 PUFFS BY MOUTH FOUR TIMES A DAY.   azelastine (ASTELIN) 0.1 % nasal spray PLACE 2 SPRAYS INTO EACH NOSTRIL TWICE DAILY.    benazepril (LOTENSIN) 40 MG tablet TAKE ONE TABLET BY MOUTH DAILY.   benzonatate (TESSALON PERLES) 100 MG capsule Take 2 capsules (200 mg total) by mouth 3 (three) times daily as needed for cough.   budesonide-formoterol (SYMBICORT) 160-4.5 MCG/ACT inhaler Inhale 2 puffs into the lungs 2 (two) times daily.   Calcium Carbonate-Vitamin D (CALCIUM 600 + D PO) Take 1 tablet by mouth 2 (two) times daily. 600/400   fluticasone (FLONASE) 50 MCG/ACT nasal spray Place 2 sprays into both nostrils daily.   loratadine (CLARITIN) 10 MG tablet TAKE 1 TABLET BY MOUTH ONCE DAILY FOR ALLERGIES.   montelukast (SINGULAIR) 10 MG tablet Take 1 tablet (10 mg total) by mouth at bedtime.   Omega-3 Fatty Acids (FISH OIL) 1000 MG CAPS Take 1 capsule by mouth 2 (two) times daily.    polyethylene glycol powder (GLYCOLAX/MIRALAX) powder MIX 1 CAPFUL IN 8 OUNCES OF JUICE OR WATER AND DRINK ONCE DAILY.   potassium chloride SA (K-DUR,KLOR-CON) 20 MEQ tablet TAKE 1 TABLET BY MOUTH DAILY.   simvastatin (ZOCOR) 20 MG tablet TAKE (1) TABLET BY MOUTH DAILY AT 6 PM FOR CHOLESTEROL.   tiZANidine (ZANAFLEX) 4 MG tablet One by mouth every night before bed as needed for spasm   traMADol (ULTRAM) 50 MG tablet TAKE (1) TABLET BY MOUTH EVERY SIX HOURS AS NEEDED.     Allergies:   Penicillins and Sulfonamide derivatives   Social History   Tobacco Use   Smoking status: Current Every Day Smoker    Packs/day: 1.00    Years: 50.00    Pack years: 50.00    Types: Cigarettes   Smokeless tobacco: Never Used   Tobacco comment: cigaretes  now 10 to 15 per day  Substance Use Topics   Alcohol use: No    Alcohol/week: 0.0 standard drinks   Drug use: No     Family Hx: The patient's family history includes Breast cancer in her mother; COPD in her father; Emphysema in her father; Glaucoma in her brother; Heart attack in her brother; Hepatitis C in her daughter; Lung cancer in her sister; SIDS in her son; Schizophrenia in her  brother; Seizures in her daughter; Throat cancer in her sister. There is no history of Colon cancer or Colon polyps.  ROS:   Please see the history of present illness.    All other systems reviewed and are negative.   Labs/Other Tests and Data Reviewed:     Recent Labs: 01/24/2018: ALT 11; TSH 1.49 08/07/2018: BUN 19; Creatinine, Ser 0.79; Hemoglobin 14.6; Platelets 355; Potassium 4.9; Sodium 140   Recent Lipid Panel Lab Results  Component Value Date/Time   CHOL 188 01/24/2018 11:32 AM   TRIG 130 01/24/2018 11:32 AM   HDL 71 01/24/2018 11:32 AM   CHOLHDL 2.6 01/24/2018 11:32 AM   LDLCALC 94 01/24/2018 11:32 AM    Wt Readings from Last 3 Encounters:  08/26/18 167 lb (75.8 kg)  08/07/18 167 lb (75.8 kg)  07/24/18 164 lb (74.4 kg)  Objective:    Vital Signs:  Ht '5\' 4"'  (1.626 m)    Wt 167 lb (75.8 kg)    BMI 28.67 kg/m    GEN:  alert and oriented  RESPIRATORY:  difficult to note breathing due to severe hoarness.  PSYCH:  normal affect and mood, though reports feeling run down dealing with this.  ASSESSMENT & PLAN:    1. Hoarseness of voice Severe hoarseness today. Will call ENT today and get appt ASAP Encouraged to stop smoking. Advised to start taking omeprazole daily   2. Acute laryngitis ENT referral ASAP Sinuses and Lungs clear from recent xrays.   Time:   Today, I have spent 10 minutes with the patient with telehealth technology discussing the above problems.     Medication Adjustments/Labs and Tests Ordered: Current medicines are reviewed at length with the patient today.  Concerns regarding medicines are outlined above.   Tests Ordered: No orders of the defined types were placed in this encounter.   Medication Changes: No orders of the defined types were placed in this encounter.   Disposition:  Follow up 09/09/2018  Signed, Perlie Mayo, NP  08/26/2018 2:56 PM     [facility]

## 2018-08-29 ENCOUNTER — Ambulatory Visit (INDEPENDENT_AMBULATORY_CARE_PROVIDER_SITE_OTHER): Payer: Medicare Other | Admitting: Family Medicine

## 2018-08-29 ENCOUNTER — Encounter: Payer: Self-pay | Admitting: Family Medicine

## 2018-08-29 ENCOUNTER — Other Ambulatory Visit: Payer: Self-pay

## 2018-08-29 VITALS — BP 140/66 | HR 92 | Temp 98.9°F | Resp 14 | Ht 65.0 in | Wt 177.0 lb

## 2018-08-29 DIAGNOSIS — I10 Essential (primary) hypertension: Secondary | ICD-10-CM | POA: Diagnosis not present

## 2018-08-29 DIAGNOSIS — M7989 Other specified soft tissue disorders: Secondary | ICD-10-CM | POA: Diagnosis not present

## 2018-08-29 MED ORDER — FUROSEMIDE 20 MG PO TABS: 20.0000 mg | ORAL_TABLET | Freq: Every day | ORAL | 0 refills | Status: DC

## 2018-08-29 NOTE — Progress Notes (Signed)
Subjective:     Patient ID: [name], female   DOB: [birth year], 73 y.o.   MRN: [id number]  [name] presents for Leg Swelling  New onset foot, ankle, and leg swelling bilaterally for 1.5-2 weeks. Painful to walk and touch due to swelling. No injury noted. Nothing has been tried to improve the swelling. She reports she was on a fluid pill before she doesn't recall what or why she was taken off it.   She has no other complaints today. Previously had a phone visit with me this week about hoarseness and loss of voice. Is follow up with ENT soon.  Today patient denies signs and symptoms of COVID 19 infection including fever, chills, cough, shortness of breath, and headache. She does have a baseline cough and SHOB due to smoking.  Past Medical, Surgical, Social History, Allergies, and Medications have been Reviewed.    Past Medical History:  Diagnosis Date  . Asthma   . Breast cancer ([place]) 2007   Stage I (T1b N0 M0), grade 1 well-differentiated carcinoma of the left breast status, post lumpectomy followed by radiation therapy. Her estrogen receptor receptors were 93%, progesterone receptors 67%. HER-2/neu was negative. No lymphovascular space invasion was seen. All margins were clear. Ki-67 marker was low at 1% with surgery on 11/15/2004. Treated then with post-lumpectomy radiation, finish  . Breast cancer, left breast ([place]) 2007  . COPD (chronic obstructive pulmonary disease) ([place])   . Diabetes mellitus without complication ([place])   . Diverticula of colon   . Hyperlipidemia   . Hypertension   . Kidney stones   . Nicotine dependence   . Osteoarthritis    Past Surgical History:  Procedure Laterality Date  . ABDOMINAL HYSTERECTOMY    . BREAST SURGERY  2005 approx   left lumpectomy   . CHOLECYSTECTOMY N/A 05/26/2014   Procedure: LAPAROSCOPIC CHOLECYSTECTOMY;  Surgeon: Aviva Signs Md, MD;  Location: [facility];  Service: General;  Laterality: N/A;  . COLECTOMY     2005,  diverticulitis  . COLONOSCOPY N/A 05/20/2017   Procedure: COLONOSCOPY;  Surgeon: Danie Binder, MD;  Location: [facility];  Service: Endoscopy;  Laterality: N/A;  1:45pm  . DILATION AND CURETTAGE OF UTERUS    . ESOPHAGOGASTRODUODENOSCOPY N/A 05/20/2017   Procedure: ESOPHAGOGASTRODUODENOSCOPY (EGD);  Surgeon: Danie Binder, MD;  Location: [facility];  Service: Endoscopy;  Laterality: N/A;  . left breast      cancer, in 2006  . TUBAL LIGATION     Social History   Socioeconomic History  . Marital status: Single    Spouse name: Not on file  . Number of children: 5  . Years of education: 9th grade   . Highest education level: 9th grade  Occupational History  . Occupation: retired    Fish farm manager: RETIRED  Social Needs  . Financial resource strain: Not very hard  . Food insecurity    Worry: Sometimes true    Inability: Sometimes true  . Transportation needs    Medical: No    Non-medical: No  Tobacco Use  . Smoking status: Current Every Day Smoker    Packs/day: 1.00    Years: 50.00    Pack years: 50.00    Types: Cigarettes  . Smokeless tobacco: Never Used  . Tobacco comment: cigaretes  now 10 to 15 per day  Substance and Sexual Activity  . Alcohol use: No    Alcohol/week: 0.0 standard drinks  . Drug use: No  .  Sexual activity: Not Currently  Lifestyle  . Physical activity    Days per week: 7 days    Minutes per session: 30 min  . Stress: Only a little  Relationships  . Social Herbalist on phone: Twice a week    Gets together: Never    Attends religious service: Never    Active member of club or organization: No    Attends meetings of clubs or organizations: Never    Relationship status: Divorced  . Intimate partner violence    Fear of current or ex partner: No    Emotionally abused: No    Physically abused: No    Forced sexual activity: No  Other Topics Concern  . 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 QUIT WORKING 2003 AFTER CANCER DIAGNOSIS.      MARRIED WITH A RUN AWAY HUSBAND(FUGITIVE 10 YRS). 3 MISCARRIAGES, 5 CHILDREN    Outpatient Encounter Medications as of 08/29/2018  Medication Sig  . acetaminophen (TYLENOL) 325 MG tablet Take 650 mg by mouth every 6 (six) hours as needed.  Mar[place] Kitchen albuterol (PROVENTIL) (2.5 MG/3ML) 0.083% nebulizer solution INHALE ONE VIAL VIA NEBULIZER THREE TIMES DAILY.  Mar[place] Kitchen amLODipine (NORVASC) 5 MG tablet TAKE 1 TABLET BY MOUTH ONCE A DAY.  Marland Kitchen aspirin EC 81 MG tablet Take 81 mg by mouth daily.  . ATROVENT HFA 17 MCG/ACT inhaler INHALE 2 PUFFS BY MOUTH FOUR TIMES A DAY.  Marland Kitchen azelastine (ASTELIN) 0.1 % nasal spray PLACE 2 SPRAYS INTO EACH NOSTRIL TWICE DAILY.  . benazepril (LOTENSIN) 40 MG tablet TAKE ONE TABLET BY MOUTH DAILY.  . benzonatate (TESSALON PERLES) 100 MG capsule Take 2 capsules (200 mg total) by mouth 3 (three) times daily as needed for cough.  . budesonide-formoterol (SYMBICORT) 160-4.5 MCG/ACT inhaler Inhale 2 puffs into the lungs 2 (two) times daily.  . Calcium Carbonate-Vitamin D (CALCIUM 600 + D PO) Take 1 tablet by mouth 2 (two) times daily. 600/400  . fluticasone (FLONASE) 50 MCG/ACT nasal spray Place 2 sprays into both nostrils daily.  Marland Kitchen loratadine (CLARITIN) 10 MG tablet TAKE 1 TABLET BY MOUTH ONCE DAILY FOR ALLERGIES.  Marland Kitchen montelukast (SINGULAIR) 10 MG tablet Take 1 tablet (10 mg total) by mouth at bedtime.  . Omega-3 Fatty Acids (FISH OIL) 1000 MG CAPS Take 1 capsule by mouth 2 (two) times daily.   Marland Kitchen omeprazole (PRILOSEC) 20 MG capsule Take 20 mg by mouth daily.  . polyethylene glycol powder (GLYCOLAX/MIRALAX) powder MIX 1 CAPFUL IN 8 OUNCES OF JUICE OR WATER AND DRINK ONCE DAILY.  Marland Kitchen potassium chloride SA (K-DUR,KLOR-CON) 20 MEQ tablet TAKE 1 TABLET BY MOUTH DAILY.  . simvastatin (ZOCOR) 20 MG tablet TAKE (1) TABLET BY MOUTH DAILY AT 6 PM FOR CHOLESTEROL.  Marland Kitchen tiZANidine (ZANAFLEX) 4 MG tablet One by mouth every night before bed as  needed for spasm  . traMADol (ULTRAM) 50 MG tablet TAKE (1) TABLET BY MOUTH EVERY SIX HOURS AS NEEDED.   No facility-administered encounter medications on file as of 08/29/2018.    Allergies  Allergen Reactions  . Penicillins Hives, Shortness Of Breath and Swelling    Has patient had a PCN reaction causing immediate rash, facial/tongue/throat swelling, SOB or lightheadedness with hypotension: yes Has patient had a PCN reaction causing severe rash involving mucus membranes or skin necrosis:no Has patient had a PCN reaction that required hospitalization: yes Has patient had a PCN reaction occurring within the  last 10 years: no If all of the above answers are "NO", then may proceed with Cephalosporin use.  . Sulfonamide Derivatives Other (See Comments)    Shaking all over, seizure like symptoms. Hospitalization resulted     Review of Systems  Constitutional: Negative for chills and fever.  HENT: Negative.   Eyes: Negative.   Respiratory: Positive for cough and shortness of breath.   Cardiovascular: Positive for leg swelling. Negative for chest pain and palpitations.  Gastrointestinal: Negative.   Endocrine: Negative.   Genitourinary: Negative.   Musculoskeletal: Negative.   Skin: Negative.   Allergic/Immunologic: Negative.   Neurological: Negative.   Hematological: Negative.   Psychiatric/Behavioral: Negative.   All other systems reviewed and are negative.      Objective:     BP 140/66   Pulse 92   Temp 98.9 F (37.2 C) (Oral)   Resp 14   Ht '5\' 5"'$  (1.651 m)   Wt 177 lb 0.6 oz (80.3 kg)   SpO2 94%   BMI 29.46 kg/m   Physical Exam Vitals signs and nursing note reviewed.  Constitutional:      Appearance: Normal appearance. She is well-developed, well-groomed and overweight.  HENT:     Head: Normocephalic and atraumatic.     Right Ear: External ear normal.     Left Ear: External ear normal.     Nose: Nose normal.     Mouth/Throat:     Mouth: Mucous membranes are  moist.     Pharynx: Oropharynx is clear.  Eyes:     General:        Right eye: No discharge.        Left eye: No discharge.     Conjunctiva/sclera: Conjunctivae normal.  Neck:     Musculoskeletal: Normal range of motion and neck supple.  Cardiovascular:     Rate and Rhythm: Normal rate and regular rhythm.     Pulses:          Dorsalis pedis pulses are 1+ on the right side and 1+ on the left side.     Heart sounds: Normal heart sounds.  Pulmonary:     Effort: Pulmonary effort is normal.     Breath sounds: Decreased air movement present.  Musculoskeletal: Normal range of motion.     Right lower leg: 1+ Edema present.     Left lower leg: 1+ Edema present.  Feet:     Right foot:     Skin integrity: Dry skin present. No skin breakdown, erythema or warmth.     Left foot:     Skin integrity: Dry skin present. No skin breakdown or erythema.  Skin:    General: Skin is warm.  Neurological:     General: No focal deficit present.     Mental Status: She is alert and oriented to person, place, and time.     Cranial Nerves: Cranial nerves are intact.     Sensory: Sensation is intact.     Motor: Tremor present.     Coordination: Coordination is intact.     Gait: Gait is intact.  Psychiatric:        Attention and Perception: Attention normal.        Mood and Affect: Mood normal.        Speech: Speech normal.        Behavior: Behavior normal. Behavior is cooperative.        Thought Content: Thought content normal.        Cognition and  Memory: Cognition normal.        Judgment: Judgment normal.        Assessment and Plan       1. Leg swelling Kidney function is good.  Heart and Breath sounds good. Suspect vascular related. Suggest compression hose/socks and 3 days of low dose lasix to see if that improves her legs and ankles. If it does improve, will discuss with Dr Moshe Cipro about considering low dose diuretic.  Follow up 4/-5 days  - furosemide (LASIX) 20 MG tablet; Take 1  tablet (20 mg total) by mouth daily for 3 days.  Dispense: 3 tablet; Refill: 0  2. Hypertension goal BP (blood pressure) < 130/80 RESSIE SLEVIN is encouraged to maintain a well balanced diet that is low in salt. Not at goal. Reports not taking medication yet this morning. Continue current medication regimen. No refills needed.  Compression socks recommended as well for leg swelling.   Follow Up: 09/02/2018  Perlie Mayo, DNP, AGNP-BC [address], [place], [place] [address] Office Hours: Mon-Thurs 8 am-5 pm; Fri 8 am-12 pm Office Phone:  [phone]  Office Fax: [phone]

## 2018-08-29 NOTE — Patient Instructions (Signed)
    Thank you for coming into the office today. I appreciate the opportunity to provide you with the care for your health and wellness. Today we discussed: edema  Follow Up: 09/02/2018 (phone visit)   No labs today  Please take lasix as directed for the next 3 days. Start wearing compression socks.  Please continue to practice social distancing to keep you, your family, and our community safe.  If you must go out, please wear a Mask and practice good handwashing.  [place] YOUR HANDS WELL AND FREQUENTLY. AVOID TOUCHING YOUR FACE, UNLESS YOUR HANDS ARE FRESHLY WASHED.  GET FRESH AIR DAILY. STAY HYDRATED WITH WATER.   It was a pleasure to see you and I look forward to continuing to work together on your health and well-being. Please do not hesitate to call the office if you need care or have questions about your care.  Have a wonderful day and week. With Gratitude, Cherly Beach, DNP, AGNP-BC

## 2018-09-02 ENCOUNTER — Other Ambulatory Visit: Payer: Self-pay

## 2018-09-02 ENCOUNTER — Ambulatory Visit (INDEPENDENT_AMBULATORY_CARE_PROVIDER_SITE_OTHER): Payer: Medicare Other | Admitting: Family Medicine

## 2018-09-02 ENCOUNTER — Encounter: Payer: Self-pay | Admitting: Family Medicine

## 2018-09-02 DIAGNOSIS — R490 Dysphonia: Secondary | ICD-10-CM

## 2018-09-02 DIAGNOSIS — M7989 Other specified soft tissue disorders: Secondary | ICD-10-CM | POA: Diagnosis not present

## 2018-09-02 NOTE — Patient Instructions (Signed)
     I appreciate the opportunity to provide you with the care for your health and wellness. Today we discussed: leg swelling  Follow up: 09/09/2018 already scheduled  No labs  Please get some compression socks to help with swelling. If still no improvement we might need a referral to vascular specialist.  Please continue to practice social distancing to keep you, your family, and our community safe.  If you must go out, please wear a Mask and practice good handwashing.  [place] YOUR HANDS WELL AND FREQUENTLY. AVOID TOUCHING YOUR FACE, UNLESS YOUR HANDS ARE FRESHLY WASHED.   GET FRESH AIR DAILY. STAY HYDRATED WITH WATER.   It was a pleasure to see you and I look forward to continuing to work together on your health and well-being. Please do not hesitate to call the office if you need care or have questions about your care.  Have a wonderful day and week.  With Gratitude,  Cherly Beach, DNP, AGNP-BC

## 2018-09-02 NOTE — Progress Notes (Signed)
Virtual Visit via Telephone Note   This visit type was conducted due to national recommendations for restrictions regarding the COVID-19 Pandemic (e.g. social distancing) in an effort to limit this patient's exposure and mitigate transmission in our community.  Due to her co-morbid illnesses, this patient is at least at moderate risk for complications without adequate follow up.  This format is felt to be most appropriate for this patient at this time.  The patient did not have access to video technology/had technical difficulties with video requiring transitioning to audio format only (telephone).  All issues noted in this document were discussed and addressed.  No physical exam could be performed with this format.    Evaluation Performed:  Follow-up visit  Date:  09/02/2018   ID:  Tanya, [name] 12-17-1945, MRN [id number]  Patient Location: Home Provider Location: Office  Location of Patient: Home Location of Provider: Telehealth Consent was obtain for visit to be over via telehealth. I verified that I am speaking with the correct person using two identifiers.  PCP:  Tanya Helper, MD   Chief Complaint:    History of Present Illness:    [name] is a 73 y.o. female with   Was seen on 08/29/2018 for leg swelling. Gave lasix and ordered compression socks to be started. No changes in leg size with lasix. They did not get compression socks yet. Reports increase in urination post lasix, but no change in legs.   Baseline cough with COPD. On going hoarseness is seeing ENT this week.   The patient does not have all symptoms concerning for COVID-19 infection (fever, chills, cough, or new shortness of breath).   Past Medical, Surgical, Social History, Allergies, and Medications have been Reviewed.    Past Medical History:  Diagnosis Date   Asthma    Breast cancer ([place]) 2007   Stage I (T1b N0 M0), grade 1 well-differentiated carcinoma of the left breast  status, post lumpectomy followed by radiation therapy. Her estrogen receptor receptors were 93%, progesterone receptors 67%. HER-2/neu was negative. No lymphovascular space invasion was seen. All margins were clear. Ki-67 marker was low at 1% with surgery on 11/15/2004. Treated then with post-lumpectomy radiation, finish   Breast cancer, left breast ([place]) 2007   COPD (chronic obstructive pulmonary disease) ([place])    Diabetes mellitus without complication ([place])    Diverticula of colon    Hyperlipidemia    Hypertension    Kidney stones    Nicotine dependence    Osteoarthritis    Past Surgical History:  Procedure Laterality Date   ABDOMINAL HYSTERECTOMY     BREAST SURGERY  2005 approx   left lumpectomy    CHOLECYSTECTOMY N/A 05/26/2014   Procedure: LAPAROSCOPIC CHOLECYSTECTOMY;  Surgeon: Aviva Signs Md, MD;  Location: [facility];  Service: General;  Laterality: N/A;   COLECTOMY     2005, diverticulitis   COLONOSCOPY N/A 05/20/2017   Procedure: COLONOSCOPY;  Surgeon: Danie Binder, MD;  Location: [facility];  Service: Endoscopy;  Laterality: N/A;  1:45pm   DILATION AND CURETTAGE OF UTERUS     ESOPHAGOGASTRODUODENOSCOPY N/A 05/20/2017   Procedure: ESOPHAGOGASTRODUODENOSCOPY (EGD);  Surgeon: Danie Binder, MD;  Location: [facility];  Service: Endoscopy;  Laterality: N/A;   left breast      cancer, in 2006   TUBAL LIGATION       Current Meds  Medication Sig   acetaminophen (TYLENOL) 325 MG tablet Take 650 mg by  mouth every 6 (six) hours as needed.   albuterol (PROVENTIL) (2.5 MG/3ML) 0.083% nebulizer solution INHALE ONE VIAL VIA NEBULIZER THREE TIMES DAILY.   amLODipine (NORVASC) 5 MG tablet TAKE 1 TABLET BY MOUTH ONCE A DAY.   aspirin EC 81 MG tablet Take 81 mg by mouth daily.   ATROVENT HFA 17 MCG/ACT inhaler INHALE 2 PUFFS BY MOUTH FOUR TIMES A DAY.   azelastine (ASTELIN) 0.1 % nasal spray PLACE 2 SPRAYS INTO EACH NOSTRIL TWICE DAILY.   benazepril  (LOTENSIN) 40 MG tablet TAKE ONE TABLET BY MOUTH DAILY.   benzonatate (TESSALON PERLES) 100 MG capsule Take 2 capsules (200 mg total) by mouth 3 (three) times daily as needed for cough.   budesonide-formoterol (SYMBICORT) 160-4.5 MCG/ACT inhaler Inhale 2 puffs into the lungs 2 (two) times daily.   Calcium Carbonate-Vitamin D (CALCIUM 600 + D PO) Take 1 tablet by mouth 2 (two) times daily. 600/400   fluticasone (FLONASE) 50 MCG/ACT nasal spray Place 2 sprays into both nostrils daily.   loratadine (CLARITIN) 10 MG tablet TAKE 1 TABLET BY MOUTH ONCE DAILY FOR ALLERGIES.   montelukast (SINGULAIR) 10 MG tablet Take 1 tablet (10 mg total) by mouth at bedtime.   Omega-3 Fatty Acids (FISH OIL) 1000 MG CAPS Take 1 capsule by mouth 2 (two) times daily.    omeprazole (PRILOSEC) 20 MG capsule Take 20 mg by mouth daily.   polyethylene glycol powder (GLYCOLAX/MIRALAX) powder MIX 1 CAPFUL IN 8 OUNCES OF JUICE OR WATER AND DRINK ONCE DAILY.   potassium chloride SA (K-DUR,KLOR-CON) 20 MEQ tablet TAKE 1 TABLET BY MOUTH DAILY.   simvastatin (ZOCOR) 20 MG tablet TAKE (1) TABLET BY MOUTH DAILY AT 6 PM FOR CHOLESTEROL.   tiZANidine (ZANAFLEX) 4 MG tablet One by mouth every night before bed as needed for spasm   traMADol (ULTRAM) 50 MG tablet TAKE (1) TABLET BY MOUTH EVERY SIX HOURS AS NEEDED.     Allergies:   Penicillins and Sulfonamide derivatives   Social History   Tobacco Use   Smoking status: Current Every Day Smoker    Packs/day: 1.00    Years: 50.00    Pack years: 50.00    Types: Cigarettes   Smokeless tobacco: Never Used   Tobacco comment: cigaretes  now 10 to 15 per day  Substance Use Topics   Alcohol use: No    Alcohol/week: 0.0 standard drinks   Drug use: No     Family Hx: The patient's family history includes Breast cancer in her mother; COPD in her father; Emphysema in her father; Glaucoma in her brother; Heart attack in her brother; Hepatitis C in her daughter; Lung cancer  in her sister; SIDS in her son; Schizophrenia in her brother; Seizures in her daughter; Throat cancer in her sister. There is no history of Colon cancer or Colon polyps.  ROS:   Please see the history of present illness.    All other systems reviewed and are negative.   Labs/Other Tests and Data Reviewed:     Recent Labs: 01/24/2018: ALT 11; TSH 1.49 08/07/2018: BUN 19; Creatinine, Ser 0.79; Hemoglobin 14.6; Platelets 355; Potassium 4.9; Sodium 140   Recent Lipid Panel Lab Results  Component Value Date/Time   CHOL 188 01/24/2018 11:32 AM   TRIG 130 01/24/2018 11:32 AM   HDL 71 01/24/2018 11:32 AM   CHOLHDL 2.6 01/24/2018 11:32 AM   LDLCALC 94 01/24/2018 11:32 AM    Wt Readings from Last 3 Encounters:  08/29/18 177 lb 0.6  oz (80.3 kg)  08/26/18 167 lb (75.8 kg)  08/07/18 167 lb (75.8 kg)     Objective:    Vital Signs:  There were no vitals taken for this visit.   GEN:  alert and oriented  RESPIRATORY:  no shortness of breath  PSYCH:  normal mood and affect  ASSESSMENT & PLAN:    1. Leg swelling Did not improve with lasix. They did not get compression socks yet. Recommended this to be started soon. Still concerned for dependency swelling. Might need referral to vascular in future.   2. Hoarseness of voice Unchanged, ENT this week   Time:   Today, I have spent 10 minutes with the patient with telehealth technology discussing the above problems.     Medication Adjustments/Labs and Tests Ordered: Current medicines are reviewed at length with the patient today.  Concerns regarding medicines are outlined above.   Tests Ordered: No orders of the defined types were placed in this encounter.   Medication Changes: No orders of the defined types were placed in this encounter.   Disposition:  Follow up 2 weeks   Signed, Perlie Mayo, NP  09/02/2018 2:57 PM     [facility]

## 2018-09-04 ENCOUNTER — Ambulatory Visit (INDEPENDENT_AMBULATORY_CARE_PROVIDER_SITE_OTHER): Payer: Medicare Other | Admitting: Otolaryngology

## 2018-09-04 DIAGNOSIS — F17210 Nicotine dependence, cigarettes, uncomplicated: Secondary | ICD-10-CM | POA: Diagnosis not present

## 2018-09-04 DIAGNOSIS — R490 Dysphonia: Secondary | ICD-10-CM

## 2018-09-04 DIAGNOSIS — J382 Nodules of vocal cords: Secondary | ICD-10-CM | POA: Diagnosis not present

## 2018-09-09 ENCOUNTER — Other Ambulatory Visit: Payer: Self-pay

## 2018-09-09 ENCOUNTER — Ambulatory Visit (INDEPENDENT_AMBULATORY_CARE_PROVIDER_SITE_OTHER): Payer: Medicare Other | Admitting: Family Medicine

## 2018-09-09 ENCOUNTER — Encounter (INDEPENDENT_AMBULATORY_CARE_PROVIDER_SITE_OTHER): Payer: Self-pay

## 2018-09-09 ENCOUNTER — Encounter: Payer: Self-pay | Admitting: Family Medicine

## 2018-09-09 VITALS — BP 144/83 | HR 83 | Resp 12 | Ht 65.0 in | Wt 176.0 lb

## 2018-09-09 DIAGNOSIS — R490 Dysphonia: Secondary | ICD-10-CM

## 2018-09-09 DIAGNOSIS — I77819 Aortic ectasia, unspecified site: Secondary | ICD-10-CM | POA: Diagnosis not present

## 2018-09-09 DIAGNOSIS — I10 Essential (primary) hypertension: Secondary | ICD-10-CM

## 2018-09-09 DIAGNOSIS — E785 Hyperlipidemia, unspecified: Secondary | ICD-10-CM | POA: Diagnosis not present

## 2018-09-09 DIAGNOSIS — F17200 Nicotine dependence, unspecified, uncomplicated: Secondary | ICD-10-CM

## 2018-09-09 DIAGNOSIS — J449 Chronic obstructive pulmonary disease, unspecified: Secondary | ICD-10-CM

## 2018-09-09 DIAGNOSIS — Z23 Encounter for immunization: Secondary | ICD-10-CM

## 2018-09-09 DIAGNOSIS — R600 Localized edema: Secondary | ICD-10-CM

## 2018-09-09 DIAGNOSIS — J4489 Other specified chronic obstructive pulmonary disease: Secondary | ICD-10-CM

## 2018-09-09 DIAGNOSIS — M62838 Other muscle spasm: Secondary | ICD-10-CM

## 2018-09-09 MED ORDER — CYCLOBENZAPRINE HCL 5 MG PO TABS: ORAL_TABLET | ORAL | 1 refills | Status: DC

## 2018-09-09 NOTE — Patient Instructions (Addendum)
Follow-up with MD in 4.5 months call if you need me sooner please  Flu vaccine at visit today.  Please commit to stopping smoking as this is both damaging to your lungs and your larynx and causing you to have chronic hoarseness.  Please start with the NicoDerm patch 14 mg/h and take use this for the 1 month then reduce to the 7 mg patch do not smoke while using the patch.  Fasting lipid hepatic panel and Chem-7 in the next 2 to 4 weeks.  Please keep appointment for ENT follow-up due to chronic hoarseness and speak as little as possible as well as use the medication prescribed by ENT.   New medication Flexeril is prescribed for as needed use at bedtime for muscle spasm.  For leg swelling keep legs elevated at home and wear compression hose that you have when you are out or standing for long.    Thanks for choosing [facility], we consider it a privelige to serve you.

## 2018-09-14 ENCOUNTER — Encounter: Payer: Self-pay | Admitting: Family Medicine

## 2018-09-14 DIAGNOSIS — R600 Localized edema: Secondary | ICD-10-CM | POA: Insufficient documentation

## 2018-09-14 DIAGNOSIS — M62838 Other muscle spasm: Secondary | ICD-10-CM | POA: Insufficient documentation

## 2018-09-14 NOTE — Assessment & Plan Note (Signed)
Asked:confirms currently smokes cigarettes Assess: Unwilling to quit but cutting back, will start patches when she decides on quit date Advise: needs to QUIT to reduce risk of cancer, cardio and cerebrovascular disease Assist: counseled for 5 minutes and literature provided Arrange: follow up in 3 months

## 2018-09-14 NOTE — Assessment & Plan Note (Signed)
Mild, pt to wear compression knee high hose

## 2018-09-14 NOTE — Assessment & Plan Note (Signed)
Being managed by ENT has scheduled follow up

## 2018-09-14 NOTE — Assessment & Plan Note (Signed)
Hyperlipidemia:Low fat diet discussed and encouraged.   Lipid Panel  Lab Results  Component Value Date   CHOL 188 01/24/2018   HDL 71 01/24/2018   LDLCALC 94 01/24/2018   TRIG 130 01/24/2018   CHOLHDL 2.6 01/24/2018  Controlled, no change in medication

## 2018-09-14 NOTE — Assessment & Plan Note (Signed)
Worsening due to ongoing nicotine use, continue current treatment and work on smoking cessation

## 2018-09-14 NOTE — Assessment & Plan Note (Signed)
Elevated systolic, however pt coughing excessively, no med change DASH diet and commitment to daily physical activity for a minimum of 30 minutes discussed and encouraged, as a part of hypertension management. The importance of attaining a healthy weight is also discussed.  BP/Weight 09/09/2018 08/29/2018 08/26/2018 08/07/2018 07/24/2018 06/20/2018 [id number]  Systolic BP 123456 [id number] - Q000111Q Q000111Q Q000111Q -  Diastolic BP 83 66 - 66 66 66 -  Wt. (Lbs) 176.04 177.04 167 167 164 175.08 163  BMI 29.29 29.46 28.67 27.79 28.15 29.13 27.12

## 2018-09-14 NOTE — Assessment & Plan Note (Signed)
Flexeril as needed at bedtim due to cramps disturbing  Sleep. Warned of sedative s/e and fall risk reduction and home safety discussed

## 2018-09-14 NOTE — Progress Notes (Signed)
[name]     MRN: [id number]      DOB: 1945-06-29   HPI [name] is here for follow up and re-evaluation of chronic medical conditions, medication management and review of any available recent lab and radiology data.  Preventive health is updated, specifically  Cancer screening and Immunization.   Questions or concerns regarding consultations or procedures which the PT has had in the interim are  addressed. The PT denies any adverse reactions to current medications since the last visit.  C/o chronic cough and loss of voice. She has ahd ENT eval, been advised to quit smoking and therapy for gERD  Increased , with f/u arranged C/o leg swelling C/o leg cramps disturbing sleep at night ROS Denies recent fever or chills.  Denies chest pains, palpitations , PND or orthopnea Denies abdominal pain, nausea, vomiting,diarrhea or constipation.   Denies dysuria, frequency, hesitancy or incontinence. C/o chronic   joint pain, swelling and limitation in mobility. Denies headaches, seizures,c/o lower extremity  Numbness and  tingling. Denies depression,uncontrolled  anxiety or insomnia. Denies skin break down or rash.   PE  BP (!) 144/83   Pulse 83   Resp 12   Ht 5\' 5"  (1.651 m)   Wt 176 lb 0.6 oz (79.9 kg)   SpO2 100%   BMI 29.29 kg/m   Patient alert and oriented and in no cardiopulmonary distress.Hoarse and barely able to speak  HEENT: No facial asymmetry, EOMI,   No sinus tenderness .  Neck supple no JVD, no mass.TM clear bilaterally  Chest: Clear to auscultation bilaterally.No crackles I or whjeezes  CVS: S1, S2 no murmurs, no S3.Regular rate.  ABD: Soft non tender.   Ext: Trace  Edema bilaterally MS: Adequate though reduced  ROM spine, shoulders, hips and knees.  Skin: Intact, no ulcerations or rash noted.  Psych: Good eye contact, normal affect. Memory intact not anxious or depressed appearing.  CNS: CN 2-12 intact, power,  normal throughout.no focal deficits noted.    Assessment & Plan  Hypertension goal BP (blood pressure) < 123XX123 Elevated systolic, however pt coughing excessively, no med change DASH diet and commitment to daily physical activity for a minimum of 30 minutes discussed and encouraged, as a part of hypertension management. The importance of attaining a healthy weight is also discussed.  BP/Weight 09/09/2018 08/29/2018 08/26/2018 08/07/2018 07/24/2018 06/20/2018 [id number]  Systolic BP 123456 [id number] - Q000111Q Q000111Q Q000111Q -  Diastolic BP 83 66 - 66 66 66 -  Wt. (Lbs) 176.04 177.04 167 167 164 175.08 163  BMI 29.29 29.46 28.67 27.79 28.15 29.13 27.12       NICOTINE ADDICTION Asked:confirms currently smokes cigarettes Assess: Unwilling to quit but cutting back, will start patches when she decides on quit date Advise: needs to QUIT to reduce risk of cancer, cardio and cerebrovascular disease Assist: counseled for 5 minutes and literature provided Arrange: follow up in 3 months   Hoarseness of voice Being managed by ENT has scheduled follow up  COPD (chronic obstructive pulmonary disease) with chronic bronchitis ([place]) Worsening due to ongoing nicotine use, continue current treatment and work on smoking cessation  Hyperlipidemia LDL goal <100 Hyperlipidemia:Low fat diet discussed and encouraged.   Lipid Panel  Lab Results  Component Value Date   CHOL 188 01/24/2018   HDL 71 01/24/2018   LDLCALC 94 01/24/2018   TRIG 130 01/24/2018   CHOLHDL 2.6 01/24/2018  Controlled, no change in medication      Bilateral leg edema  Mild, pt to wear compression knee high hose  Muscle spasm Flexeril as needed at bedtim due to cramps disturbing  Sleep. Warned of sedative s/e and fall risk reduction and home safety discussed

## 2018-09-15 ENCOUNTER — Emergency Department (HOSPITAL_COMMUNITY)
Admission: EM | Admit: 2018-09-15 | Discharge: 2018-09-15 | Disposition: A | Discharge status: Home / Self Care | Payer: Medicare Other | Attending: Emergency Medicine | Admitting: Emergency Medicine

## 2018-09-15 ENCOUNTER — Emergency Department (HOSPITAL_COMMUNITY): Payer: Medicare Other

## 2018-09-15 ENCOUNTER — Encounter (HOSPITAL_COMMUNITY): Payer: Self-pay | Admitting: Emergency Medicine

## 2018-09-15 ENCOUNTER — Other Ambulatory Visit: Payer: Self-pay

## 2018-09-15 DIAGNOSIS — I10 Essential (primary) hypertension: Secondary | ICD-10-CM | POA: Insufficient documentation

## 2018-09-15 DIAGNOSIS — R2243 Localized swelling, mass and lump, lower limb, bilateral: Secondary | ICD-10-CM | POA: Diagnosis not present

## 2018-09-15 DIAGNOSIS — J441 Chronic obstructive pulmonary disease with (acute) exacerbation: Secondary | ICD-10-CM | POA: Diagnosis not present

## 2018-09-15 DIAGNOSIS — F17210 Nicotine dependence, cigarettes, uncomplicated: Secondary | ICD-10-CM | POA: Diagnosis not present

## 2018-09-15 DIAGNOSIS — R0602 Shortness of breath: Secondary | ICD-10-CM | POA: Diagnosis not present

## 2018-09-15 DIAGNOSIS — R0689 Other abnormalities of breathing: Secondary | ICD-10-CM | POA: Diagnosis not present

## 2018-09-15 DIAGNOSIS — R05 Cough: Secondary | ICD-10-CM | POA: Diagnosis not present

## 2018-09-15 DIAGNOSIS — C50919 Malignant neoplasm of unspecified site of unspecified female breast: Secondary | ICD-10-CM | POA: Diagnosis not present

## 2018-09-15 DIAGNOSIS — Z79899 Other long term (current) drug therapy: Secondary | ICD-10-CM | POA: Insufficient documentation

## 2018-09-15 DIAGNOSIS — E119 Type 2 diabetes mellitus without complications: Secondary | ICD-10-CM | POA: Diagnosis not present

## 2018-09-15 DIAGNOSIS — M7989 Other specified soft tissue disorders: Secondary | ICD-10-CM | POA: Diagnosis not present

## 2018-09-15 DIAGNOSIS — R069 Unspecified abnormalities of breathing: Secondary | ICD-10-CM | POA: Diagnosis not present

## 2018-09-15 DIAGNOSIS — Z7984 Long term (current) use of oral hypoglycemic drugs: Secondary | ICD-10-CM | POA: Diagnosis not present

## 2018-09-15 LAB — COMPREHENSIVE METABOLIC PANEL
ALT: 16 U/L (ref 0–44)
AST: 18 U/L (ref 15–41)
Albumin: 4.5 g/dL (ref 3.5–5.0)
Alkaline Phosphatase: 76 U/L (ref 38–126)
Anion gap: 13 (ref 5–15)
BUN: 16 mg/dL (ref 8–23)
CO2: 24 mmol/L (ref 22–32)
Calcium: 10.1 mg/dL (ref 8.9–10.3)
Chloride: 104 mmol/L (ref 98–111)
Creatinine, Ser: 0.81 mg/dL (ref 0.44–1.00)
GFR calc Af Amer: >60 mL/min (ref >60)
GFR calc non Af Amer: >60 mL/min (ref >60)
Glucose, Bld: 116 mg/dL — ABNORMAL HIGH (ref 70–99)
Potassium: 4.2 mmol/L (ref 3.5–5.1)
Sodium: 141 mmol/L (ref 135–145)
Total Bilirubin: 0.3 mg/dL (ref 0.3–1.2)
Total Protein: 8.1 g/dL (ref 6.5–8.1)

## 2018-09-15 LAB — CBC WITH DIFFERENTIAL/PLATELET
Abs Immature Granulocytes: 0.03 10*3/uL (ref 0.00–0.07)
Basophils Absolute: 0.1 10*3/uL (ref 0.0–0.1)
Basophils Relative: 1 %
Eosinophils Absolute: 0.3 10*3/uL (ref 0.0–0.5)
Eosinophils Relative: 3 %
HCT: 46.8 % — ABNORMAL HIGH (ref 36.0–46.0)
Hemoglobin: 14.6 g/dL (ref 12.0–15.0)
Immature Granulocytes: 0 %
Lymphocytes Relative: 25 %
Lymphs Abs: 2.8 10*3/uL (ref 0.7–4.0)
MCH: 29.1 pg (ref 26.0–34.0)
MCHC: 31.2 g/dL (ref 30.0–36.0)
MCV: 93.4 fL (ref 80.0–100.0)
Monocytes Absolute: 1.3 10*3/uL — ABNORMAL HIGH (ref 0.1–1.0)
Monocytes Relative: 11 %
Neutro Abs: 6.7 10*3/uL (ref 1.7–7.7)
Neutrophils Relative %: 60 %
Platelets: 399 10*3/uL (ref 150–400)
RBC: 5.01 MIL/uL (ref 3.87–5.11)
RDW: 13.9 % (ref 11.5–15.5)
WBC: 11.3 10*3/uL — ABNORMAL HIGH (ref 4.0–10.5)
nRBC: 0 % (ref 0.0–0.2)

## 2018-09-15 LAB — BRAIN NATRIURETIC PEPTIDE: B Natriuretic Peptide: 42 pg/mL (ref 0.0–100.0)

## 2018-09-15 LAB — TROPONIN I (HIGH SENSITIVITY): Troponin I (High Sensitivity): 7 ng/L (ref <18)

## 2018-09-15 MED ORDER — METHYLPREDNISOLONE SODIUM SUCC 125 MG IJ SOLR
125.0000 mg | Freq: Once | INTRAMUSCULAR | Status: AC
  Administered 2018-09-15: 11:00:00 125 mg via INTRAVENOUS
  Filled 2018-09-15: qty 2

## 2018-09-15 MED ORDER — ACETAMINOPHEN 325 MG PO TABS
650.0000 mg | ORAL_TABLET | Freq: Once | ORAL | Status: AC
  Administered 2018-09-15: 15:00:00 650 mg via ORAL
  Filled 2018-09-15: qty 2

## 2018-09-15 MED ORDER — ALBUTEROL SULFATE HFA 108 (90 BASE) MCG/ACT IN AERS
2.0000 | INHALATION_SPRAY | Freq: Once | RESPIRATORY_TRACT | Status: AC
  Administered 2018-09-15: 2 via RESPIRATORY_TRACT
  Filled 2018-09-15: qty 6.7

## 2018-09-15 MED ORDER — PREDNISONE 50 MG PO TABS: ORAL_TABLET | ORAL | 0 refills | Status: DC

## 2018-09-15 NOTE — ED Triage Notes (Signed)
Pt states she has ongoing swelling of her lower extremities with some sob. Has seen her pcp multiple times for same. EMS reports full ashtray at house with pt smoking on their arrival.

## 2018-09-15 NOTE — ED Notes (Signed)
Pt maintained O2 sats from 93-100% while ambulating.

## 2018-09-15 NOTE — ED Provider Notes (Signed)
[facility] EMERGENCY DEPARTMENT Provider Note   CSN: [id number] Arrival date & time: 09/15/18  1013     History   Chief Complaint Chief Complaint  Patient presents with  . Leg Swelling    HPI [name] is a 73 y.o. female with a past medical history significant for COPD, diabetes, hypertension and treated left breast cancer presenting with a 24-hour history of worsening shortness of breath along with cough which has been productive of a clear to yellow sputum production.  She denies fevers or chills, has had no diaphoresis, nausea or vomiting, no abdominal or chest pain, but she has been wheezing which has not responded to her home breathing treatments.  She has a history of 1 pack/day smoking intake, has tried to cut back recently but has not been successful.  She also endorses bilateral lower extremity edema, denies history of CHF.  No known exposures to COVID.     The history is provided by the patient.    Past Medical History:  Diagnosis Date  . Asthma   . Breast cancer ([place]) 2007   Stage I (T1b N0 M0), grade 1 well-differentiated carcinoma of the left breast status, post lumpectomy followed by radiation therapy. Her estrogen receptor receptors were 93%, progesterone receptors 67%. HER-2/neu was negative. No lymphovascular space invasion was seen. All margins were clear. Ki-67 marker was low at 1% with surgery on 11/15/2004. Treated then with post-lumpectomy radiation, finish  . Breast cancer, left breast ([place]) 2007  . COPD (chronic obstructive pulmonary disease) ([place])   . Diabetes mellitus without complication ([place])   . Diverticula of colon   . Hyperlipidemia   . Hypertension   . Kidney stones   . Nicotine dependence   . Osteoarthritis     Patient Active Problem List   Diagnosis Date Noted  . Bilateral leg edema 09/14/2018  . Muscle spasm 09/14/2018  . Hoarseness of voice 08/07/2018  . Coronary artery calcification seen on CAT scan 11/09/2017  . Colon adenomas  11/07/2017  . Bloating symptom 11/07/2017  . Allergic rhinitis 01/04/2015  . 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[place]) 09/26/2014  . Hydrosalpinx 09/28/2013  . Osteopenia 07/15/2013  . postmenopausal ovarian cyst 04/06/2013  . Pulmonary nodules 04/04/2012  . Breast cancer ([place]) 03/11/2012  . Ganglion cyst 05/08/2011  . COPD (chronic obstructive pulmonary disease) with chronic bronchitis ([place]) 07/24/2009  . Hyperlipidemia LDL goal <100 03/09/2009  . CONSTIPATION, CHRONIC 03/09/2009  . NICOTINE ADDICTION 10/10/2008  . Hypertension goal BP (blood pressure) < 130/80 09/29/2008  . GENERALIZED OSTEOARTHROSIS UNSPECIFIED SITE 09/29/2008    Past Surgical History:  Procedure Laterality Date  . ABDOMINAL HYSTERECTOMY    . BREAST SURGERY  2005 approx   left lumpectomy   . CHOLECYSTECTOMY N/A 05/26/2014   Procedure: LAPAROSCOPIC CHOLECYSTECTOMY;  Surgeon: Aviva Signs Md, MD;  Location: [facility] ORS;  Service: General;  Laterality: N/A;  . COLECTOMY     2005, diverticulitis  . COLONOSCOPY N/A 05/20/2017   Procedure: COLONOSCOPY;  Surgeon: Danie Binder, MD;  Location: [facility] ENDO SUITE;  Service: Endoscopy;  Laterality: N/A;  1:45pm  . DILATION AND CURETTAGE OF UTERUS    . ESOPHAGOGASTRODUODENOSCOPY N/A 05/20/2017   Procedure: ESOPHAGOGASTRODUODENOSCOPY (EGD);  Surgeon: Danie Binder, MD;  Location: [facility] ENDO SUITE;  Service: Endoscopy;  Laterality: N/A;  . left breast      cancer, in 2006  . TUBAL LIGATION       OB History    Gravida  8   Para  5  Term  5   Preterm      AB  3   Living  2     SAB  3   TAB      Ectopic      Multiple      Live Births               Home Medications    Prior to Admission medications   Medication Sig Start Date End Date Taking? Authorizing Provider  acetaminophen (TYLENOL) 325 MG tablet Take 650 mg by mouth every 6 (six) hours as needed.    [provider]  albuterol (PROVENTIL) (2.5 MG/3ML) 0.083% nebulizer solution INHALE ONE VIAL VIA NEBULIZER THREE  TIMES DAILY. 07/08/18   Perlie Mayo, NP  amLODipine (NORVASC) 5 MG tablet TAKE 1 TABLET BY MOUTH ONCE A DAY. 04/18/18   [name], MD  aspirin EC 81 MG tablet Take 81 mg by mouth daily.    [provider]  ATROVENT HFA 17 MCG/ACT inhaler INHALE 2 PUFFS BY MOUTH FOUR TIMES A DAY. 04/14/18   [name], MD  azelastine (ASTELIN) 0.1 % nasal spray PLACE 2 SPRAYS INTO EACH NOSTRIL TWICE DAILY. 06/12/18   [name], MD  benazepril (LOTENSIN) 40 MG tablet TAKE ONE TABLET BY MOUTH DAILY. 05/27/18   [name], MD  benzonatate (TESSALON PERLES) 100 MG capsule Take 2 capsules (200 mg total) by mouth 3 (three) times daily as needed for cough. 06/12/18   Perlie Mayo, NP  budesonide-formoterol [facility]) 160-4.5 MCG/ACT inhaler Inhale 2 puffs into the lungs 2 (two) times daily. 10/22/17   [name], MD  Calcium Carbonate-Vitamin D (CALCIUM 600 + D PO) Take 1 tablet by mouth 2 (two) times daily. 600/400    [provider]  cyclobenzaprine (FLEXERIL) 5 MG tablet Take 1 tablet at bedtime as needed for muscle spasm 09/09/18   [name], MD  fluticasone [facility]) 50 MCG/ACT nasal spray Place 2 sprays into both nostrils daily.    [provider]  loratadine (CLARITIN) 10 MG tablet TAKE 1 TABLET BY MOUTH ONCE DAILY FOR ALLERGIES. 08/25/18   [name], MD  montelukast (SINGULAIR) 10 MG tablet Take 1 tablet (10 mg total) by mouth at bedtime. 10/22/17   [name], MD  Omega-3 Fatty Acids (FISH OIL) 1000 MG CAPS Take 1 capsule by mouth 2 (two) times daily.     [provider]  omeprazole (PRILOSEC) 20 MG capsule Take 20 mg by mouth daily.    [provider]  polyethylene glycol powder (GLYCOLAX/MIRALAX) powder MIX 1 CAPFUL IN 8 OUNCES OF JUICE OR WATER AND DRINK ONCE DAILY. 04/23/16   [name], MD  potassium chloride SA (K-DUR,KLOR-CON) 20 MEQ tablet TAKE 1 TABLET BY MOUTH DAILY. 04/18/18    [name], MD  predniSONE (DELTASONE) 50 MG tablet Take one tablet daily for 5 days 09/15/18   Evalee Jefferson, PA-C  simvastatin (ZOCOR) 20 MG tablet TAKE (1) TABLET BY MOUTH DAILY AT 6 PM FOR CHOLESTEROL. 08/25/18   [name], MD  tiZANidine (ZANAFLEX) 4 MG tablet One by mouth every night before bed as needed for spasm 03/25/18   Sanjuana Kava, MD  traMADol (ULTRAM) 50 MG tablet TAKE (1) TABLET BY MOUTH EVERY SIX HOURS AS NEEDED. 08/13/18   Sanjuana Kava, MD    Family History Family History  Problem Relation Age of Onset  . Breast cancer Mother   . COPD Father   .  Emphysema Father   . Throat cancer Sister   . Glaucoma Brother   . Schizophrenia Brother   . Heart attack Brother   . Lung cancer Sister        former smoker  . Hepatitis C Daughter   . Seizures Daughter   . SIDS Son   . Colon cancer Neg Hx   . Colon polyps Neg Hx     Social History Social History   Tobacco Use  . Smoking status: Current Every Day Smoker    Packs/day: 1.00    Years: 50.00    Pack years: 50.00    Types: Cigarettes  . Smokeless tobacco: Never Used  . Tobacco comment: cigaretes  now 10 to 15 per day  Substance Use Topics  . Alcohol use: No    Alcohol/week: 0.0 standard drinks  . Drug use: No     Allergies   Penicillins and Sulfonamide derivatives   Review of Systems Review of Systems  Constitutional: Negative for chills, diaphoresis and fever.  HENT: Negative for congestion and sore throat.   Eyes: Negative.   Respiratory: Positive for cough and shortness of breath. Negative for chest tightness.   Cardiovascular: Positive for leg swelling. Negative for chest pain.  Gastrointestinal: Negative for abdominal pain and nausea.  Genitourinary: Negative.   Musculoskeletal: Negative for arthralgias, joint swelling and neck pain.  Skin: Negative.  Negative for rash and wound.  Neurological: Negative for dizziness, weakness, light-headedness, numbness and headaches.   Psychiatric/Behavioral: Negative.      Physical Exam Updated Vital Signs BP (!) 146/51   Pulse 81   Temp 98.7 F (37.1 C) (Oral)   Resp 12   Ht _0  (1.626 m)   Wt 75.8 kg   SpO2 94%   BMI 28.67 kg/m   Physical Exam Vitals signs and nursing note reviewed.  Constitutional:      General: She is not in acute distress.    Appearance: She is well-developed.  HENT:     Head: Normocephalic and atraumatic.  Eyes:     Conjunctiva/sclera: Conjunctivae normal.  Neck:     Musculoskeletal: Normal range of motion.  Cardiovascular:     Rate and Rhythm: Normal rate and regular rhythm.     Heart sounds: Normal heart sounds.  Pulmonary:     Effort: Respiratory distress present.     Breath sounds: Wheezing present.  Chest:     Chest wall: No tenderness.  Abdominal:     General: Bowel sounds are normal.     Palpations: Abdomen is soft.     Tenderness: There is no abdominal tenderness. There is no guarding.  Musculoskeletal: Normal range of motion.     Right lower leg: Edema present.     Left lower leg: Edema present.     Comments: Trace bilateral ankle edema.  No calf pain. Legs symmetric.  Skin:    General: Skin is warm and dry.  Neurological:     Mental Status: She is alert.      ED Treatments / Results  Labs (all labs ordered are listed, but only abnormal results are displayed) Labs Reviewed  COMPREHENSIVE METABOLIC PANEL - Abnormal; Notable for the following components:      Result Value   Glucose, Bld 116 (*)    All other components within normal limits  CBC WITH DIFFERENTIAL/PLATELET - Abnormal; Notable for the following components:   WBC 11.3 (*)    HCT 46.8 (*)    Monocytes Absolute 1.3 (*)  All other components within normal limits  BRAIN NATRIURETIC PEPTIDE  TROPONIN I (HIGH SENSITIVITY)    EKG None  Radiology Dg Chest Port 1 View  Result Date: 09/15/2018 CLINICAL DATA:  Shortness of breath and swelling EXAM: PORTABLE CHEST 1 VIEW COMPARISON:   08/07/2018 FINDINGS: Cardiac shadow is stable but accentuated by the portable technique. Aortic calcifications are noted. The lungs are well aerated bilaterally. Mild vascular congestion is noted without interstitial edema. No focal infiltrate is seen. IMPRESSION: Mild vascular congestion without interstitial edema. Electronically Signed   By: Inez Catalina M.D.   On: 09/15/2018 11:36   Dg Hip Unilat W Or Wo Pelvis 2-3 Views Right  Result Date: 09/15/2018 CLINICAL DATA:  RIGHT hip pain post fall 2 months ago, ongoing swelling in lower extremities, shortness of breath, diabetes mellitus, hypertension, COPD, breast cancer EXAM: DG HIP (WITH OR WITHOUT PELVIS) 2-3V RIGHT COMPARISON:  05/03/2018 FINDINGS: Osseous demineralization. Hip and SI joint spaces preserved. No acute fracture, dislocation, or bone destruction. Few scattered pelvic phleboliths. IMPRESSION: No acute osseous abnormalities. Electronically Signed   By: Lavonia Dana M.D.   On: 09/15/2018 13:20    Procedures Procedures (including critical care time)  Medications Ordered in ED Medications  methylPREDNISolone sodium succinate (SOLU-MEDROL) 125 mg/2 mL injection 125 mg (125 mg Intravenous Given 09/15/18 1116)  albuterol (VENTOLIN HFA) 108 (90 Base) MCG/ACT inhaler 2 puff (2 puffs Inhalation Given 09/15/18 1115)  acetaminophen (TYLENOL) tablet 650 mg (650 mg Oral Given 09/15/18 1448)     Initial Impression / Assessment and Plan / ED Course  I have reviewed the triage vital signs and the nursing notes.  Pertinent labs & imaging results that were available during my care of the patient were reviewed by me and considered in my medical decision making (see chart for details).        Pt with shortness of breath and wheezing, h/o COPD, given albuterol and solumedrol here, feeling improved with breathing.  Now with c/o right hip pain since fall within the past month, has not been evaluated for this.  Will order xray of right hip, then plan  checking oxygen with ambulation, if stable, consider dc home.   Patient ambulated around the department and saturation remained above 93%, she denied any shortness of breath and felt improved enough to go home.  She was prescribed a prednisone pulse dose of 50 mg daily for 5 additional days, advised to continue her other home C OPD meds.  Plan close follow-up with her PCP.  Also outlined strict return precautions for any worsening symptoms.  Her vital signs have been stable, including normal pulse rates.  She has no exam findings to suggest DVT or PE.  Final Clinical Impressions(s) / ED Diagnoses   Final diagnoses:  Chronic obstructive pulmonary disease with acute exacerbation [facility])    ED Discharge Orders         Ordered    predniSONE (DELTASONE) 50 MG tablet     09/15/18 1451           Evalee Jefferson, PA-C 09/15/18 [place], [place], DO 09/18/18 1545

## 2018-09-15 NOTE — Discharge Instructions (Addendum)
Continue taking your home medicines including your inhaler medicines. Take your next dose of prednisone tomorrow morning.

## 2018-09-15 NOTE — ED Notes (Signed)
EKG given to Dr. Cook  

## 2018-09-15 NOTE — ED Notes (Signed)
Pt is much more relaxed. Non labored breathing

## 2018-09-23 ENCOUNTER — Emergency Department (HOSPITAL_COMMUNITY)
Admission: EM | Admit: 2018-09-23 | Discharge: 2018-09-23 | Discharge status: Against Medical Advice | Payer: Medicare Other | Attending: Emergency Medicine | Admitting: Emergency Medicine

## 2018-09-23 ENCOUNTER — Encounter (HOSPITAL_COMMUNITY): Payer: Self-pay | Admitting: Emergency Medicine

## 2018-09-23 ENCOUNTER — Other Ambulatory Visit: Payer: Self-pay

## 2018-09-23 DIAGNOSIS — Z5321 Procedure and treatment not carried out due to patient leaving prior to being seen by health care provider: Secondary | ICD-10-CM | POA: Diagnosis not present

## 2018-09-23 DIAGNOSIS — M79601 Pain in right arm: Secondary | ICD-10-CM | POA: Diagnosis present

## 2018-09-23 NOTE — ED Triage Notes (Signed)
Patient complains of right arm pain after fall yesterday evening.

## 2018-09-24 ENCOUNTER — Other Ambulatory Visit (HOSPITAL_COMMUNITY): Payer: Self-pay

## 2018-09-24 DIAGNOSIS — Z170 Estrogen receptor positive status [ER+]: Secondary | ICD-10-CM

## 2018-09-24 DIAGNOSIS — C50212 Malignant neoplasm of upper-inner quadrant of left female breast: Secondary | ICD-10-CM

## 2018-09-25 ENCOUNTER — Ambulatory Visit (INDEPENDENT_AMBULATORY_CARE_PROVIDER_SITE_OTHER): Payer: Medicare Other | Admitting: Family Medicine

## 2018-09-25 ENCOUNTER — Inpatient Hospital Stay (HOSPITAL_COMMUNITY): Payer: Medicare Other | Attending: Hematology

## 2018-09-25 ENCOUNTER — Ambulatory Visit (HOSPITAL_COMMUNITY)
Admission: RE | Admit: 2018-09-25 | Discharge: 2018-09-25 | Disposition: A | Discharge status: Home / Self Care | Payer: Medicare Other | Source: Ambulatory Visit | Attending: Family Medicine | Admitting: Family Medicine

## 2018-09-25 ENCOUNTER — Other Ambulatory Visit: Payer: Self-pay

## 2018-09-25 ENCOUNTER — Encounter: Payer: Self-pay | Admitting: Family Medicine

## 2018-09-25 ENCOUNTER — Encounter (HOSPITAL_COMMUNITY): Payer: Self-pay

## 2018-09-25 ENCOUNTER — Encounter (INDEPENDENT_AMBULATORY_CARE_PROVIDER_SITE_OTHER): Payer: Self-pay

## 2018-09-25 VITALS — BP 150/76 | HR 100 | Temp 98.7°F | Resp 12 | Ht 65.0 in | Wt 179.1 lb

## 2018-09-25 DIAGNOSIS — M85811 Other specified disorders of bone density and structure, right shoulder: Secondary | ICD-10-CM | POA: Diagnosis not present

## 2018-09-25 DIAGNOSIS — Z859 Personal history of malignant neoplasm, unspecified: Secondary | ICD-10-CM | POA: Insufficient documentation

## 2018-09-25 DIAGNOSIS — R05 Cough: Secondary | ICD-10-CM

## 2018-09-25 DIAGNOSIS — M79601 Pain in right arm: Secondary | ICD-10-CM

## 2018-09-25 DIAGNOSIS — J449 Chronic obstructive pulmonary disease, unspecified: Secondary | ICD-10-CM

## 2018-09-25 DIAGNOSIS — R0781 Pleurodynia: Secondary | ICD-10-CM

## 2018-09-25 DIAGNOSIS — J45909 Unspecified asthma, uncomplicated: Secondary | ICD-10-CM | POA: Diagnosis not present

## 2018-09-25 DIAGNOSIS — W19XXXA Unspecified fall, initial encounter: Secondary | ICD-10-CM

## 2018-09-25 DIAGNOSIS — J3089 Other allergic rhinitis: Secondary | ICD-10-CM | POA: Diagnosis not present

## 2018-09-25 DIAGNOSIS — M19011 Primary osteoarthritis, right shoulder: Secondary | ICD-10-CM | POA: Insufficient documentation

## 2018-09-25 DIAGNOSIS — S299XXA Unspecified injury of thorax, initial encounter: Secondary | ICD-10-CM | POA: Diagnosis not present

## 2018-09-25 DIAGNOSIS — M25511 Pain in right shoulder: Secondary | ICD-10-CM | POA: Diagnosis not present

## 2018-09-25 DIAGNOSIS — M79631 Pain in right forearm: Secondary | ICD-10-CM | POA: Diagnosis not present

## 2018-09-25 DIAGNOSIS — Z853 Personal history of malignant neoplasm of breast: Secondary | ICD-10-CM | POA: Insufficient documentation

## 2018-09-25 DIAGNOSIS — I10 Essential (primary) hypertension: Secondary | ICD-10-CM | POA: Insufficient documentation

## 2018-09-25 DIAGNOSIS — R059 Cough, unspecified: Secondary | ICD-10-CM

## 2018-09-25 DIAGNOSIS — M79621 Pain in right upper arm: Secondary | ICD-10-CM | POA: Insufficient documentation

## 2018-09-25 DIAGNOSIS — M25531 Pain in right wrist: Secondary | ICD-10-CM | POA: Insufficient documentation

## 2018-09-25 DIAGNOSIS — E119 Type 2 diabetes mellitus without complications: Secondary | ICD-10-CM | POA: Diagnosis not present

## 2018-09-25 DIAGNOSIS — C50212 Malignant neoplasm of upper-inner quadrant of left female breast: Secondary | ICD-10-CM

## 2018-09-25 LAB — CBC WITH DIFFERENTIAL/PLATELET
Abs Immature Granulocytes: 0.17 10*3/uL — ABNORMAL HIGH (ref 0.00–0.07)
Basophils Absolute: 0.1 10*3/uL (ref 0.0–0.1)
Basophils Relative: 1 %
Eosinophils Absolute: 0.2 10*3/uL (ref 0.0–0.5)
Eosinophils Relative: 1 %
HCT: 46.6 % — ABNORMAL HIGH (ref 36.0–46.0)
Hemoglobin: 14 g/dL (ref 12.0–15.0)
Immature Granulocytes: 1 %
Lymphocytes Relative: 14 %
Lymphs Abs: 2.4 10*3/uL (ref 0.7–4.0)
MCH: 28.6 pg (ref 26.0–34.0)
MCHC: 30 g/dL (ref 30.0–36.0)
MCV: 95.1 fL (ref 80.0–100.0)
Monocytes Absolute: 1.9 10*3/uL — ABNORMAL HIGH (ref 0.1–1.0)
Monocytes Relative: 10 %
Neutro Abs: 13.1 10*3/uL — ABNORMAL HIGH (ref 1.7–7.7)
Neutrophils Relative %: 73 %
Platelets: 397 10*3/uL (ref 150–400)
RBC: 4.9 MIL/uL (ref 3.87–5.11)
RDW: 14.6 % (ref 11.5–15.5)
WBC: 17.9 10*3/uL — ABNORMAL HIGH (ref 4.0–10.5)
nRBC: 0 % (ref 0.0–0.2)

## 2018-09-25 LAB — COMPREHENSIVE METABOLIC PANEL
ALT: 15 U/L (ref 0–44)
AST: 14 U/L — ABNORMAL LOW (ref 15–41)
Albumin: 3.9 g/dL (ref 3.5–5.0)
Alkaline Phosphatase: 68 U/L (ref 38–126)
Anion gap: 9 (ref 5–15)
BUN: 20 mg/dL (ref 8–23)
CO2: 26 mmol/L (ref 22–32)
Calcium: 9.1 mg/dL (ref 8.9–10.3)
Chloride: 102 mmol/L (ref 98–111)
Creatinine, Ser: 0.78 mg/dL (ref 0.44–1.00)
GFR calc Af Amer: >60 mL/min (ref >60)
GFR calc non Af Amer: >60 mL/min (ref >60)
Glucose, Bld: 129 mg/dL — ABNORMAL HIGH (ref 70–99)
Potassium: 4 mmol/L (ref 3.5–5.1)
Sodium: 137 mmol/L (ref 135–145)
Total Bilirubin: 0.2 mg/dL — ABNORMAL LOW (ref 0.3–1.2)
Total Protein: 7.2 g/dL (ref 6.5–8.1)

## 2018-09-25 LAB — LACTATE DEHYDROGENASE: LDH: 163 U/L (ref 98–192)

## 2018-09-25 MED ORDER — FLUTICASONE PROPIONATE 50 MCG/ACT NA SUSP: 2.0000 | Freq: Every day | NASAL | 2 refills | Status: DC

## 2018-09-25 MED ORDER — KETOROLAC TROMETHAMINE 60 MG/2ML IM SOLN
60.0000 mg | Freq: Once | INTRAMUSCULAR | Status: AC
  Administered 2018-09-25: 60 mg via INTRAMUSCULAR

## 2018-09-25 MED ORDER — BENZONATATE 100 MG PO CAPS: 200.0000 mg | ORAL_CAPSULE | Freq: Three times a day (TID) | ORAL | 0 refills | Status: DC | PRN

## 2018-09-25 NOTE — Progress Notes (Signed)
Subjective:     Patient ID: [name], female   DOB: Oct 12, 1945, 73 y.o.   MRN: [id number]  CAMIYAH [name] presents for Edema and Arm Pain (right arm, fell in bath tub and heard it crack)  [name] presents today after having a fall in the bathtub on 9/8.  Had presented to the emergency room but was there for 6 hours without getting into be seen and became frustrated and left.  She reports that she was using a washer machine and the lid fell on her right arm at first.  It was very painful and uncomfortable so she decided to soak it in Epson salt in the bathtub.  Upon trying to get herself out of the bathtub she slipped and fell and then heard a crack in the upper right side.  She believes it was her arm but it might been her ribs as she is sore throughout her right side.  She denies having any deformities noted.  She denies having any breaks in the skin.  She has full range of motion with tenderness.  Of note she is not taking 1 of her blood pressure medicines today.  So her blood pressure is elevated.  She reports she takes it at 1:00pm assess when she picked up the prescription and that is how she remembers to take it.  Unsure of her blood pressure is super well-controlled secondary to this.  Will need to reassess at appointment in the future.  Today patient denies signs and symptoms of COVID 19 infection including fever, chills, cough, shortness of breath, and headache.   Past Medical, Surgical, Social History, Allergies, and Medications have been Reviewed.   Past Medical History:  Diagnosis Date  . Asthma   . Breast cancer ([place]) 2007   Stage I (T1b N0 M0), grade 1 well-differentiated carcinoma of the left breast status, post lumpectomy followed by radiation therapy. Her estrogen receptor receptors were 93%, progesterone receptors 67%. HER-2/neu was negative. No lymphovascular space invasion was seen. All margins were clear. Ki-67 marker was low at 1% with surgery on 11/15/2004.  Treated then with post-lumpectomy radiation, finish  . Breast cancer, left breast ([place]) 2007  . COPD (chronic obstructive pulmonary disease) ([place])   . Diabetes mellitus without complication ([place])   . Diverticula of colon   . Hyperlipidemia   . Hypertension   . Kidney stones   . Nicotine dependence   . Osteoarthritis    Past Surgical History:  Procedure Laterality Date  . ABDOMINAL HYSTERECTOMY    . BREAST SURGERY  2005 approx   left lumpectomy   . CHOLECYSTECTOMY N/A 05/26/2014   Procedure: LAPAROSCOPIC CHOLECYSTECTOMY;  Surgeon: Aviva Signs Md, MD;  Location: [facility];  Service: General;  Laterality: N/A;  . COLECTOMY     2005, diverticulitis  . COLONOSCOPY N/A 05/20/2017   Procedure: COLONOSCOPY;  Surgeon: Danie Binder, MD;  Location: [facility];  Service: Endoscopy;  Laterality: N/A;  1:45pm  . DILATION AND CURETTAGE OF UTERUS    . ESOPHAGOGASTRODUODENOSCOPY N/A 05/20/2017   Procedure: ESOPHAGOGASTRODUODENOSCOPY (EGD);  Surgeon: Danie Binder, MD;  Location: [facility];  Service: Endoscopy;  Laterality: N/A;  . left breast      cancer, in 2006  . TUBAL LIGATION     Social History   Socioeconomic History  . Marital status: Single    Spouse name: Not on file  . Number of children: 5  . Years of education: 9th grade   .  Highest education level: 9th grade  Occupational History  . Occupation: retired    Fish farm manager: RETIRED  Social Needs  . Financial resource strain: Not very hard  . Food insecurity    Worry: Sometimes true    Inability: Sometimes true  . Transportation needs    Medical: No    Non-medical: No  Tobacco Use  . Smoking status: Current Every Day Smoker    Packs/day: 1.00    Years: 50.00    Pack years: 50.00    Types: Cigarettes  . Smokeless tobacco: Never Used  . Tobacco comment: cigaretes  now 10 to 15 per day  Substance and Sexual Activity  . Alcohol use: No    Alcohol/week: 0.0 standard drinks  . Drug use: No  . Sexual activity: Not  Currently  Lifestyle  . Physical activity    Days per week: 7 days    Minutes per session: 30 min  . Stress: Only a little  Relationships  . Social Herbalist on phone: Twice a week    Gets together: Never    Attends religious service: Never    Active member of club or organization: No    Attends meetings of clubs or organizations: Never    Relationship status: Divorced  . Intimate partner violence    Fear of current or ex partner: No    Emotionally abused: No    Physically abused: No    Forced sexual activity: No  Other Topics Concern  . 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 QUIT WORKING 2003 AFTER CANCER DIAGNOSIS.      MARRIED WITH A RUN AWAY HUSBAND(FUGITIVE 10 YRS). 3 MISCARRIAGES, 5 CHILDREN    Outpatient Encounter Medications as of 09/25/2018  Medication Sig  . acetaminophen (TYLENOL) 325 MG tablet Take 650 mg by mouth every 6 (six) hours as needed.  Marland Kitchen albuterol (PROVENTIL) (2.5 MG/3ML) 0.083% nebulizer solution INHALE ONE VIAL VIA NEBULIZER THREE TIMES DAILY.  Marland Kitchen amLODipine (NORVASC) 5 MG tablet TAKE 1 TABLET BY MOUTH ONCE A DAY.  Marland Kitchen aspirin EC 81 MG tablet Take 81 mg by mouth daily.  . ATROVENT HFA 17 MCG/ACT inhaler INHALE 2 PUFFS BY MOUTH FOUR TIMES A DAY.  Marland Kitchen azelastine (ASTELIN) 0.1 % nasal spray PLACE 2 SPRAYS INTO EACH NOSTRIL TWICE DAILY.  . benazepril (LOTENSIN) 40 MG tablet TAKE ONE TABLET BY MOUTH DAILY.  . benzonatate (TESSALON PERLES) 100 MG capsule Take 2 capsules (200 mg total) by mouth 3 (three) times daily as needed for cough.  . budesonide-formoterol (SYMBICORT) 160-4.5 MCG/ACT inhaler Inhale 2 puffs into the lungs 2 (two) times daily.  . Calcium Carbonate-Vitamin D (CALCIUM 600 + D PO) Take 1 tablet by mouth 2 (two) times daily. 600/400  . cyclobenzaprine (FLEXERIL) 5 MG tablet Take 1 tablet at bedtime as needed for muscle spasm  . fluticasone (FLONASE) 50 MCG/ACT nasal spray  Place 2 sprays into both nostrils daily.  Marland Kitchen loratadine (CLARITIN) 10 MG tablet TAKE 1 TABLET BY MOUTH ONCE DAILY FOR ALLERGIES.  Marland Kitchen montelukast (SINGULAIR) 10 MG tablet Take 1 tablet (10 mg total) by mouth at bedtime.  . Omega-3 Fatty Acids (FISH OIL) 1000 MG CAPS Take 1 capsule by mouth 2 (two) times daily.   Marland Kitchen omeprazole (PRILOSEC) 20 MG capsule Take 20 mg by mouth daily.  . polyethylene glycol powder (GLYCOLAX/MIRALAX) powder MIX 1 CAPFUL IN 8 OUNCES OF JUICE OR WATER AND DRINK ONCE DAILY.  Marland Kitchen  potassium chloride SA (K-DUR,KLOR-CON) 20 MEQ tablet TAKE 1 TABLET BY MOUTH DAILY.  . simvastatin (ZOCOR) 20 MG tablet TAKE (1) TABLET BY MOUTH DAILY AT 6 PM FOR CHOLESTEROL.  Marland Kitchen tiZANidine (ZANAFLEX) 4 MG tablet One by mouth every night before bed as needed for spasm  . traMADol (ULTRAM) 50 MG tablet TAKE (1) TABLET BY MOUTH EVERY SIX HOURS AS NEEDED.  . [DISCONTINUED] predniSONE (DELTASONE) 50 MG tablet Take one tablet daily for 5 days (Patient not taking: Reported on 09/25/2018)   No facility-administered encounter medications on file as of 09/25/2018.    Allergies  Allergen Reactions  . Penicillins Hives, Shortness Of Breath and Swelling    Has patient had a PCN reaction causing immediate rash, facial/tongue/throat swelling, SOB or lightheadedness with hypotension: yes Has patient had a PCN reaction causing severe rash involving mucus membranes or skin necrosis:no Has patient had a PCN reaction that required hospitalization: yes Has patient had a PCN reaction occurring within the last 10 years: no If all of the above answers are "NO", then may proceed with Cephalosporin use.  . Sulfonamide Derivatives Other (See Comments)    Shaking all over, seizure like symptoms. Hospitalization resulted     Review of Systems  Constitutional: Negative for chills and fever.  HENT: Negative.   Eyes: Negative.   Respiratory: Positive for cough and shortness of breath.        Baseline   Cardiovascular: Negative.    Gastrointestinal: Negative.   Endocrine: Negative.   Genitourinary: Negative.   Musculoskeletal: Negative.        Fall: pain to right arm and rib cage  Skin: Negative.   Allergic/Immunologic: Negative.   Neurological: Negative.   Hematological: Negative.   Psychiatric/Behavioral: Negative.   All other systems reviewed and are negative.      Objective:     BP (!) 150/76   Pulse 100   Temp 98.7 F (37.1 C) (Oral)   Resp 12   Ht '5\' 5"'$  (1.651 m)   Wt 179 lb 1.3 oz (81.2 kg)   SpO2 97%   BMI 29.80 kg/m   Physical Exam Vitals signs and nursing note reviewed.  Constitutional:      Appearance: Normal appearance. She is overweight.  HENT:     Head: Normocephalic and atraumatic.     Right Ear: External ear normal.     Left Ear: External ear normal.     Nose: Nose normal.     Mouth/Throat:     Pharynx: Oropharynx is clear.  Eyes:     General:        Right eye: No discharge.        Left eye: No discharge.     Conjunctiva/sclera: Conjunctivae normal.  Neck:     Musculoskeletal: Normal range of motion and neck supple.  Cardiovascular:     Rate and Rhythm: Normal rate and regular rhythm.     Pulses: Normal pulses.     Heart sounds: Normal heart sounds.  Pulmonary:     Effort: Pulmonary effort is normal.     Breath sounds: Normal breath sounds.  Musculoskeletal:     Right shoulder: She exhibits tenderness, bony tenderness and pain. She exhibits no swelling, normal pulse and normal strength.     Right elbow: She exhibits normal range of motion and no swelling.     Right wrist: She exhibits tenderness. She exhibits no bony tenderness and no swelling.     Comments: Rib cage tenderness on the Right, no  noted bruising.    Skin:    General: Skin is warm.     Findings: Ecchymosis present.     Comments: Over both arms.   Neurological:     General: No focal deficit present.     Mental Status: She is alert and oriented to person, place, and time.  Psychiatric:        Mood  and Affect: Mood normal.        Behavior: Behavior normal.        Thought Content: Thought content normal.        Judgment: Judgment normal.        Assessment and Plan       1. Fall, initial encounter As stated in HPI patient had fall.  She reports that this was from bathtub level.  Additionally she had a wash machine lid hit her arm as well.  She has tenderness throughout the physical exam on the right side.  She does have good range of motion and no noted deformity.  Will be getting x-rays to rule out any fractures to be on the safe side.  Provided with Toradol today for pain control.  Reviewed side effects, risks and benefits of medication.   Patient acknowledged agreement and understanding of the plan.    - DG Ribs Unilateral Right; Future - DG Wrist 2 Views Right; Future - DG Forearm Right; Future - DG Humerus Right; Future - DG Shoulder Right; Future - ketorolac (TORADOL) injection 60 mg  2. Cough Stable needs refill  - benzonatate (TESSALON PERLES) 100 MG capsule; Take 2 capsules (200 mg total) by mouth 3 (three) times daily as needed for cough.  Dispense: 20 capsule; Refill: 0  3. COPD (chronic obstructive pulmonary disease) with chronic bronchitis (HCC) Stable, needs refill  - benzonatate (TESSALON PERLES) 100 MG capsule; Take 2 capsules (200 mg total) by mouth 3 (three) times daily as needed for cough.  Dispense: 20 capsule; Refill: 0  4. Other allergic rhinitis Improved, needs refill   - fluticasone (FLONASE) 50 MCG/ACT nasal spray; Place 2 sprays into both nostrils daily.  Dispense: 16 g; Refill: 2  5. Rib pain on right side  - ketorolac (TORADOL) injection 60 mg  6. Right arm pain  - ketorolac (TORADOL) injection 60 mg   Follow Up: 12/03/2018  Perlie Mayo, DNP, AGNP-BC [address], [place] [address] Office Hours: Mon-Thurs 8 am-5 pm; Fri 8 am-12 pm Office Phone:   [phone]  Office Fax: [phone]

## 2018-09-25 NOTE — Patient Instructions (Signed)
    Thank you for coming into the office today. I appreciate the opportunity to provide you with the care for your health and wellness. Today we discussed: fall   Follow-up: 12/03/2018 as previously scheduled  No labs today. X-rays over [employer] today.  Toradol injection today to help with pain.  Please make sure you are mindful of where you are and if you need help please call out for help so that you do not have any more falls in the future.  Please be careful of that wash machine lid.  Please continue to practice social distancing to keep you, your family, and our community safe.  If you must go out, please wear a Mask and practice good handwashing.   [place] YOUR HANDS WELL AND FREQUENTLY. AVOID TOUCHING YOUR FACE, UNLESS YOUR HANDS ARE FRESHLY WASHED.  GET FRESH AIR DAILY. STAY HYDRATED WITH WATER.   It was a pleasure to see you and I look forward to continuing to work together on your health and well-being. Please do not hesitate to call the office if you need care or have questions about your care.  Have a wonderful day and week. With Gratitude, Cherly Beach, DNP, AGNP-BC

## 2018-09-26 NOTE — Progress Notes (Signed)
All xrays are overall good. No fractures or dislocations.  She does have some bone thinning, osteopenia-which places her at higher risk for fractures. So being careful and mindful of fall risk is very important. Make sure she is taking the calcium and vitamin D. It would be best to take 500 mg of calcium 3 times a daily (body can't absorb more than 500mg  at one time) , and 3,000 IU of vitamin D3 daily. Instead of the combo vitamin.

## 2018-09-29 ENCOUNTER — Other Ambulatory Visit: Payer: Self-pay | Admitting: Family Medicine

## 2018-10-02 ENCOUNTER — Encounter (HOSPITAL_COMMUNITY): Payer: Self-pay | Admitting: Nurse Practitioner

## 2018-10-02 ENCOUNTER — Other Ambulatory Visit: Payer: Self-pay

## 2018-10-02 ENCOUNTER — Inpatient Hospital Stay (HOSPITAL_BASED_OUTPATIENT_CLINIC_OR_DEPARTMENT_OTHER): Payer: Medicare Other | Admitting: Nurse Practitioner

## 2018-10-02 VITALS — BP 181/89 | HR 102 | Temp 97.5°F | Resp 22 | Wt 180.6 lb

## 2018-10-02 DIAGNOSIS — Z170 Estrogen receptor positive status [ER+]: Secondary | ICD-10-CM

## 2018-10-02 DIAGNOSIS — C50212 Malignant neoplasm of upper-inner quadrant of left female breast: Secondary | ICD-10-CM

## 2018-10-02 DIAGNOSIS — Z1231 Encounter for screening mammogram for malignant neoplasm of breast: Secondary | ICD-10-CM | POA: Diagnosis not present

## 2018-10-02 DIAGNOSIS — Z853 Personal history of malignant neoplasm of breast: Secondary | ICD-10-CM | POA: Diagnosis not present

## 2018-10-02 NOTE — Assessment & Plan Note (Addendum)
1.  Stage Ia invasive ductal carcinoma the left breast: -Patient was diagnosed in 2006.  She had a lumpectomy on 11/15/2004 which showed invasive ductal carcinoma; ER+/PR+/HER-2-.  Ki 67 1%. -Her lumpectomy was followed by adjuvant radiation therapy which she completed 02/22/2005. -She started antiestrogen therapy with Aromasin on 11/2004, she switched to Femara due to intolerance.  She completed a total of 5 years of antiestrogen therapy on 01/14/2010. - Patient's last screening mammogram was done on 05/29/2018 which was BI-RADS Category 1 benign. -Labs done on 09/25/2018 showed potassium 4.9, creatinine 0.78, LDH 163, WBC 17.9, hemoglobin 14.0, platelets 397. -Patient reports she has currently been battling with bronchitis for the past few weeks.  She is on antibiotics and steroids at this time and follows up with her PCP tomorrow. -She is continuing to smoke daily. - She will return in 1 year with repeat mammo-and labs.

## 2018-10-02 NOTE — Progress Notes (Signed)
[name], [place] [address]   CLINIC:  Medical Oncology/Hematology  PCP:  [name], MD [address], [place] [address] [phone]   REASON FOR VISIT: Follow-up for breast cancer  CURRENT THERAPY: Surveillance per NCCN guidelines  BRIEF ONCOLOGIC HISTORY:  Oncology History  Breast cancer ([place])  11/15/2004 Surgery   Lumpectomy    - 02/22/2005 Radiation Therapy     12/11/2004 -  Chemotherapy   Aromasin    Adverse Reaction   Intolerance to Aromasin    - 01/14/2010 Chemotherapy   Femara      CANCER STAGING: Cancer Staging Breast cancer ([place]) Staging form: Breast, AJCC 7th Edition - Clinical: Stage IA (T1b, N0, cM0) - Signed by [facility]aird Cancer, PA on 03/11/2012    INTERVAL HISTORY:  [name] 73 y.o. female returns for routine follow-up for breast cancer.  She reports she has been feeling fine since her last visit.  She has no complaints at this time.  Denies any easy bruising or bleeding.  She denies any new lumps present that she has felt. Denies any nausea, vomiting, or diarrhea. Denies any new pains. Had not noticed any recent bleeding such as epistaxis, hematuria or hematochezia. Denies recent chest pain on exertion, shortness of breath on minimal exertion, pre-syncopal episodes, or palpitations. Denies any numbness or tingling in hands or feet. Denies any recent fevers, infections, or recent hospitalizations. Patient reports appetite at 100% and energy level at 100%.  She is eating well maintaining her weight at this time.    REVIEW OF SYSTEMS:  Review of Systems  All other systems reviewed and are negative.    PAST MEDICAL/SURGICAL HISTORY:  Past Medical History:  Diagnosis Date  . Asthma   . Breast cancer ([place]) 2007   Stage I (T1b N0 M0), grade 1 well-differentiated carcinoma of the left breast status, post lumpectomy followed by radiation therapy. Her estrogen receptor receptors were 93%,  progesterone receptors 67%. HER-2/neu was negative. No lymphovascular space invasion was seen. All margins were clear. Ki-67 marker was low at 1% with surgery on 11/15/2004. Treated then with post-lumpectomy radiation, finish  . Breast cancer, left breast ([place]) 2007  . COPD (chronic obstructive pulmonary disease) ([place])   . Diabetes mellitus without complication ([place] [facility])   . Diverticula of colon   . Hyperlipidemia   . Hypertension   . Kidney stones   . Nicotine dependence   . Osteoarthritis    Past Surgical History:  Procedure Laterality Date  . ABDOMINAL HYSTERECTOMY    . BREAST SURGERY  2005 approx   left lumpectomy   . CHOLECYSTECTOMY N/A 05/26/2014   Procedure: LAPAROSCOPIC CHOLECYSTECTOMY;  Surgeon: Aviva Signs Md, MD;  Location: [facility] ORS;  Service: General;  Laterality: N/A;  . COLECTOMY     2005, diverticulitis  . COLONOSCOPY N/A 05/20/2017   Procedure: COLONOSCOPY;  Surgeon: Danie Binder, MD;  Location: [facility] SUITE;  Service: Endoscopy;  Laterality: N/A;  1:45pm  . DILATION AND CURETTAGE OF UTERUS    . ESOPHAGOGASTRODUODENOSCOPY N/A 05/20/2017   Procedure: ESOPHAGOGASTRODUODENOSCOPY (EGD);  Surgeon: Danie Binder, MD;  Location: [facility];  Service: Endoscopy;  Laterality: N/A;  . left breast      cancer, in 2006  . TUBAL LIGATION       SOCIAL HISTORY:  Social History   Socioeconomic History  . Marital status: Single    Spouse name: Not on file  . Number of  children: 5  . Years of education: 9th grade   . Highest education level: 9th grade  Occupational History  . Occupation: retired    Fish farm manager: RETIRED  Social Needs  . Financial resource strain: Not very hard  . Food insecurity    Worry: Sometimes true    Inability: Sometimes true  . Transportation needs    Medical: No    Non-medical: No  Tobacco Use  . Smoking status: Current Every Day Smoker    Packs/day: 1.00    Years: 50.00    Pack years: 50.00    Types: Cigarettes  . Smokeless tobacco: Never  Used  . Tobacco comment: cigaretes  now 10 to 15 per day  Substance and Sexual Activity  . Alcohol use: No    Alcohol/week: 0.0 standard drinks  . Drug use: No  . Sexual activity: Not Currently  Lifestyle  . Physical activity    Days per week: 7 days    Minutes per session: 30 min  . Stress: Only a little  Relationships  . Social Herbalist on phone: Twice a week    Gets together: Never    Attends religious service: Never    Active member of club or organization: No    Attends meetings of clubs or organizations: Never    Relationship status: Divorced  . Intimate partner violence    Fear of current or ex partner: No    Emotionally abused: No    Physically abused: No    Forced sexual activity: No  Other Topics Concern  . 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 QUIT WORKING 2003 AFTER CANCER DIAGNOSIS.      MARRIED WITH A RUN AWAY HUSBAND(FUGITIVE 10 YRS). 3 MISCARRIAGES, 5 CHILDREN    FAMILY HISTORY:  Family History  Problem Relation Age of Onset  . Breast cancer Mother   . COPD Father   . Emphysema Father   . Throat cancer Sister   . Glaucoma Brother   . Schizophrenia Brother   . Heart attack Brother   . Lung cancer Sister        former smoker  . Hepatitis C Daughter   . Seizures Daughter   . SIDS Son   . Colon cancer Neg Hx   . Colon polyps Neg Hx     CURRENT MEDICATIONS:  Outpatient Encounter Medications as of 10/02/2018  Medication Sig  . acetaminophen (TYLENOL) 325 MG tablet Take 650 mg by mouth every 6 (six) hours as needed.  Marland Kitchen amLODipine (NORVASC) 5 MG tablet TAKE 1 TABLET BY MOUTH ONCE A DAY.  Marland Kitchen aspirin EC 81 MG tablet Take 81 mg by mouth daily.  . benazepril (LOTENSIN) 40 MG tablet TAKE ONE TABLET BY MOUTH DAILY.  . budesonide-formoterol (SYMBICORT) 160-4.5 MCG/ACT inhaler Inhale 2 puffs into the lungs 2 (two) times daily.  . Calcium Carbonate-Vitamin D (CALCIUM 600 + D PO) Take  1 tablet by mouth 2 (two) times daily. 600/400  . famotidine (PEPCID) 20 MG tablet   . loratadine (CLARITIN) 10 MG tablet TAKE 1 TABLET BY MOUTH ONCE DAILY FOR ALLERGIES.  Marland Kitchen montelukast (SINGULAIR) 10 MG tablet Take 1 tablet (10 mg total) by mouth at bedtime.  Marland Kitchen omeprazole (PRILOSEC) 20 MG capsule Take 20 mg by mouth daily.  . polyethylene glycol powder (GLYCOLAX/MIRALAX) powder MIX 1 CAPFUL IN 8 OUNCES OF JUICE OR WATER AND DRINK ONCE DAILY.  Marland Kitchen potassium chloride SA (K-DUR,KLOR-CON)  20 MEQ tablet TAKE 1 TABLET BY MOUTH DAILY.  . simvastatin (ZOCOR) 20 MG tablet TAKE (1) TABLET BY MOUTH DAILY AT 6 PM FOR CHOLESTEROL.  Marland Kitchen traMADol (ULTRAM) 50 MG tablet TAKE (1) TABLET BY MOUTH EVERY SIX HOURS AS NEEDED.  . [DISCONTINUED] Omega-3 Fatty Acids (FISH OIL) 1000 MG CAPS Take 1 capsule by mouth 2 (two) times daily.   Marland Kitchen albuterol (PROVENTIL) (2.5 MG/3ML) 0.083% nebulizer solution INHALE ONE VIAL VIA NEBULIZER THREE TIMES DAILY. (Patient not taking: Reported on 10/02/2018)  . ATROVENT HFA 17 MCG/ACT inhaler INHALE 2 PUFFS BY MOUTH FOUR TIMES A DAY. (Patient not taking: Reported on 10/02/2018)  . azelastine (ASTELIN) 0.1 % nasal spray PLACE 2 SPRAYS INTO EACH NOSTRIL TWICE DAILY. (Patient not taking: Reported on 10/02/2018)  . benzonatate (TESSALON PERLES) 100 MG capsule Take 2 capsules (200 mg total) by mouth 3 (three) times daily as needed for cough. (Patient not taking: Reported on 10/02/2018)  . cyclobenzaprine (FLEXERIL) 5 MG tablet Take 1 tablet at bedtime as needed for muscle spasm (Patient not taking: Reported on 10/02/2018)  . fluticasone (FLONASE) 50 MCG/ACT nasal spray Place 2 sprays into both nostrils daily. (Patient not taking: Reported on 10/02/2018)  . tiZANidine (ZANAFLEX) 4 MG tablet One by mouth every night before bed as needed for spasm (Patient not taking: Reported on 10/02/2018)   No facility-administered encounter medications on file as of 10/02/2018.     ALLERGIES:  Allergies  Allergen  Reactions  . Penicillins Hives, Shortness Of Breath and Swelling    Has patient had a PCN reaction causing immediate rash, facial/tongue/throat swelling, SOB or lightheadedness with hypotension: yes Has patient had a PCN reaction causing severe rash involving mucus membranes or skin necrosis:no Has patient had a PCN reaction that required hospitalization: yes Has patient had a PCN reaction occurring within the last 10 years: no If all of the above answers are "NO", then may proceed with Cephalosporin use.  . Sulfonamide Derivatives Other (See Comments)    Shaking all over, seizure like symptoms. Hospitalization resulted      PHYSICAL EXAM:  ECOG Performance status: 1  Vitals:   10/02/18 1402  BP: (!) 181/89  Pulse: (!) 102  Resp: (!) 22  Temp: (!) 97.5 F (36.4 C)  SpO2: 98%   Filed Weights   10/02/18 1402  Weight: 180 lb 9.6 oz (81.9 kg)    Physical Exam Constitutional:      Appearance: Normal appearance. She is normal weight.  Cardiovascular:     Rate and Rhythm: Normal rate and regular rhythm.     Heart sounds: Normal heart sounds.  Pulmonary:     Effort: Pulmonary effort is normal.     Breath sounds: Normal breath sounds.  Abdominal:     General: Bowel sounds are normal.     Palpations: Abdomen is soft.  Musculoskeletal: Normal range of motion.  Skin:    General: Skin is warm and dry.  Neurological:     Mental Status: She is alert and oriented to person, place, and time. Mental status is at baseline.  Psychiatric:        Mood and Affect: Mood normal.        Behavior: Behavior normal.        Thought Content: Thought content normal.        Judgment: Judgment normal.   Breast: RIGHT: No palpable masses, no skin changes or nipple discharge, no adenopathy. LEFT: Lumpectomy scar well healed and WNL. No palpable masses, no skin  changes or nipple discharge, no adenopathy.   LABORATORY DATA:  I have reviewed the labs as listed.  CBC    Component Value Date/Time    WBC 17.9 (H) 09/25/2018 1225   RBC 4.90 09/25/2018 1225   HGB 14.0 09/25/2018 1225   HCT 46.6 (H) 09/25/2018 1225   PLT 397 09/25/2018 1225   MCV 95.1 09/25/2018 1225   MCH 28.6 09/25/2018 1225   MCHC 30.0 09/25/2018 1225   RDW 14.6 09/25/2018 1225   LYMPHSABS 2.4 09/25/2018 1225   MONOABS 1.9 (H) 09/25/2018 1225   EOSABS 0.2 09/25/2018 1225   BASOSABS 0.1 09/25/2018 1225   CMP Latest Ref Rng & Units 09/25/2018 09/15/2018 08/07/2018  Glucose 70 - 99 mg/dL 129(H) 116(H) 96  BUN 8 - 23 mg/dL '20 16 19  ' Creatinine 0.44 - 1.00 mg/dL 0.78 0.81 0.79  Sodium 135 - 145 mmol/L 137 141 140  Potassium 3.5 - 5.1 mmol/L 4.0 4.2 4.9  Chloride 98 - 111 mmol/L 102 104 105  CO2 22 - 32 mmol/L '26 24 25  ' Calcium 8.9 - 10.3 mg/dL 9.1 10.1 9.8  Total Protein 6.5 - 8.1 g/dL 7.2 8.1 -  Total Bilirubin 0.3 - 1.2 mg/dL 0.2(L) 0.3 -  Alkaline Phos 38 - 126 U/L 68 76 -  AST 15 - 41 U/L 14(L) 18 -  ALT 0 - 44 U/L 15 16 -    DIAGNOSTIC IMAGING:  I have independently reviewed the mammogram scans and discussed with the patient.    I personally performed a face-to-face visit.  All questions were answered to patient's stated satisfaction. Encouraged patient to call with any new concerns or questions before his next visit to the cancer center and we can certain see him sooner, if needed.     ASSESSMENT & PLAN:   Breast cancer 1.  Stage Ia invasive ductal carcinoma the left breast: -Patient was diagnosed in 2006.  She had a lumpectomy on 11/15/2004 which showed invasive ductal carcinoma; ER+/PR+/HER-2-.  Ki 67 1%. -Her lumpectomy was followed by adjuvant radiation therapy which she completed 02/22/2005. -She started antiestrogen therapy with Aromasin on 11/2004, she switched to Femara due to intolerance.  She completed a total of 5 years of antiestrogen therapy on 01/14/2010. - Patient's last screening mammogram was done on 05/29/2018 which was BI-RADS Category 1 benign. -Labs done on 09/25/2018 showed  potassium 4.9, creatinine 0.78, LDH 163, WBC 17.9, hemoglobin 14.0, platelets 397. -Patient reports she has currently been battling with bronchitis for the past few weeks.  She is on antibiotics and steroids at this time and follows up with her PCP tomorrow. -She is continuing to smoke daily. - She will return in 1 year with repeat mammo-and labs.      Orders placed this encounter:  Orders Placed This Encounter  Procedures  . MM 3D SCREEN BREAST BILATERAL  . Lactate dehydrogenase  . CBC with Differential/Platelet  . Comprehensive metabolic panel  . Vitamin B12  . VITAMIN D 25 Hydroxy (Vit-D Deficiency, Fractures)      Francene Finders, FNP-C [place] [phone]

## 2018-10-02 NOTE — Patient Instructions (Signed)
[facility] Discharge Instructions  Follow up in 1 year with labs and mammogram    Thank you for choosing [facility] at [facility] to provide your oncology and hematology care.  To afford each patient quality time with our provider, please arrive at least 15 minutes before your scheduled appointment time.   If you have a lab appointment with the Cancer Center please come in thru the Main Entrance and check in at the main information desk.  You need to re-schedule your appointment should you arrive 10 or more minutes late.  We strive to give you quality time with our providers, and arriving late affects you and other patients whose appointments are after yours.  Also, if you no show three or more times for appointments you may be dismissed from the clinic at the providers discretion.     Again, thank you for choosing [facility].  Our hope is that these requests will decrease the amount of time that you wait before being seen by our physicians.       _____________________________________________________________  Should you have questions after your visit to  Cancer Center, please contact our office at [phone] between the hours of 8:00 a.m. and 4:30 p.m.  Voicemails left after 4:00 p.m. will not be returned until the following business day.  For prescription refill requests, have your pharmacy contact our office and allow 72 hours.    Due to Covid, you will need to wear a mask upon entering the hospital. If you do not have a mask, a mask will be given to you at the Main Entrance upon arrival. For doctor visits, patients may have 1 support person with them. For treatment visits, patients can not have anyone with them due to social distancing guidelines and our immunocompromised population.      

## 2018-10-03 DIAGNOSIS — E785 Hyperlipidemia, unspecified: Secondary | ICD-10-CM | POA: Diagnosis not present

## 2018-10-03 DIAGNOSIS — I10 Essential (primary) hypertension: Secondary | ICD-10-CM | POA: Diagnosis not present

## 2018-10-04 LAB — BASIC METABOLIC PANEL WITH GFR
BUN: 21 mg/dL (ref 7–25)
CO2: 23 mmol/L (ref 20–32)
Calcium: 9.8 mg/dL (ref 8.6–10.4)
Chloride: 103 mmol/L (ref 98–110)
Creat: 0.89 mg/dL (ref 0.60–0.93)
GFR, Est African American: 75 mL/min/{1.73_m2} (ref >=60)
GFR, Est Non African American: 64 mL/min/{1.73_m2} (ref >=60)
Glucose, Bld: 108 mg/dL — ABNORMAL HIGH (ref 65–99)
Potassium: 5.2 mmol/L (ref 3.5–5.3)
Sodium: 139 mmol/L (ref 135–146)

## 2018-10-04 LAB — LIPID PANEL
Cholesterol: 222 mg/dL — ABNORMAL HIGH (ref <200)
HDL: 85 mg/dL (ref >=50)
LDL Cholesterol (Calc): 110 mg/dL (calc) — ABNORMAL HIGH
Non-HDL Cholesterol (Calc): 137 mg/dL (calc) — ABNORMAL HIGH (ref <130)
Total CHOL/HDL Ratio: 2.6 (calc) (ref <5.0)
Triglycerides: 155 mg/dL — ABNORMAL HIGH (ref <150)

## 2018-10-06 ENCOUNTER — Other Ambulatory Visit: Payer: Self-pay | Admitting: Family Medicine

## 2018-10-06 MED ORDER — SIMVASTATIN 40 MG PO TABS: 40.0000 mg | ORAL_TABLET | Freq: Every day | ORAL | 5 refills | Status: DC

## 2018-10-06 NOTE — Progress Notes (Signed)
zocor 40

## 2018-10-07 ENCOUNTER — Telehealth: Payer: Self-pay

## 2018-10-07 DIAGNOSIS — E785 Hyperlipidemia, unspecified: Secondary | ICD-10-CM

## 2018-10-07 NOTE — Telephone Encounter (Signed)
Labs ordered to be drawn in 4 months

## 2018-10-14 ENCOUNTER — Ambulatory Visit: Payer: Medicare Other | Admitting: Orthopaedic Surgery

## 2018-10-17 ENCOUNTER — Other Ambulatory Visit: Payer: Self-pay | Admitting: Family Medicine

## 2018-10-21 ENCOUNTER — Ambulatory Visit (INDEPENDENT_AMBULATORY_CARE_PROVIDER_SITE_OTHER): Payer: Medicare Other | Admitting: Orthopaedic Surgery

## 2018-10-21 ENCOUNTER — Other Ambulatory Visit: Payer: Self-pay

## 2018-10-21 ENCOUNTER — Encounter: Payer: Self-pay | Admitting: Orthopaedic Surgery

## 2018-10-21 VITALS — BP 192/82 | HR 99 | Ht 65.0 in | Wt 179.0 lb

## 2018-10-21 DIAGNOSIS — M545 Low back pain, unspecified: Secondary | ICD-10-CM

## 2018-10-21 DIAGNOSIS — F17210 Nicotine dependence, cigarettes, uncomplicated: Secondary | ICD-10-CM

## 2018-10-21 MED ORDER — TRAMADOL HCL 50 MG PO TABS: ORAL_TABLET | ORAL | 3 refills | Status: DC

## 2018-10-21 NOTE — Patient Instructions (Signed)
Steps to Quit Smoking Smoking tobacco is the leading cause of preventable death. It can affect almost every organ in the body. Smoking puts you and people around you at risk for many serious, long-lasting (chronic) diseases. Quitting smoking can be hard, but it is one of the best things that you can do for your health. It is never too late to quit. How do I get ready to quit? When you decide to quit smoking, make a plan to help you succeed. Before you quit:  Pick a date to quit. Set a date within the next 2 weeks to give you time to prepare.  Write down the reasons why you are quitting. Keep this list in places where you will see it often.  Tell your family, friends, and co-workers that you are quitting. Their support is important.  Talk with your doctor about the choices that may help you quit.  Find out if your health insurance will pay for these treatments.  Know the people, places, things, and activities that make you want to smoke (triggers). Avoid them. What first steps can I take to quit smoking?  Throw away all cigarettes at home, at work, and in your car.  Throw away the things that you use when you smoke, such as ashtrays and lighters.  Clean your car. Make sure to empty the ashtray.  Clean your home, including curtains and carpets. What can I do to help me quit smoking? Talk with your doctor about taking medicines and seeing a counselor at the same time. You are more likely to succeed when you do both.  If you are pregnant or breastfeeding, talk with your doctor about counseling or other ways to quit smoking. Do not take medicine to help you quit smoking unless your doctor tells you to do so. To quit smoking: Quit right away  Quit smoking totally, instead of slowly cutting back on how much you smoke over a period of time.  Go to counseling. You are more likely to quit if you go to counseling sessions regularly. Take medicine You may take medicines to help you quit. Some  medicines need a prescription, and some you can buy over-the-counter. Some medicines may contain a drug called nicotine to replace the nicotine in cigarettes. Medicines may:  Help you to stop having the desire to smoke (cravings).  Help to stop the problems that come when you stop smoking (withdrawal symptoms). Your doctor may ask you to use:  Nicotine patches, gum, or lozenges.  Nicotine inhalers or sprays.  Non-nicotine medicine that is taken by mouth. Find resources Find resources and other ways to help you quit smoking and remain smoke-free after you quit. These resources are most helpful when you use them often. They include:  Online chats with a counselor.  Phone quitlines.  Printed self-help materials.  Support groups or group counseling.  Text messaging programs.  Mobile phone apps. Use apps on your mobile phone or tablet that can help you stick to your quit plan. There are many free apps for mobile phones and tablets as well as websites. Examples include Quit Guide from the CDC and smokefree.gov  What things can I do to make it easier to quit?   Talk to your family and friends. Ask them to support and encourage you.  Call a phone quitline (1-800-QUIT-NOW), reach out to support groups, or work with a counselor.  Ask people who smoke to not smoke around you.  Avoid places that make you want to smoke,   such as: ? Bars. ? Parties. ? Smoke-break areas at work.  Spend time with people who do not smoke.  Lower the stress in your life. Stress can make you want to smoke. Try these things to help your stress: ? Getting regular exercise. ? Doing deep-breathing exercises. ? Doing yoga. ? Meditating. ? Doing a body scan. To do this, close your eyes, focus on one area of your body at a time from head to toe. Notice which parts of your body are tense. Try to relax the muscles in those areas. How will I feel when I quit smoking? Day 1 to 3 weeks Within the first 24 hours,  you may start to have some problems that come from quitting tobacco. These problems are very bad 2-3 days after you quit, but they do not often last for more than 2-3 weeks. You may get these symptoms:  Mood swings.  Feeling restless, nervous, angry, or annoyed.  Trouble concentrating.  Dizziness.  Strong desire for high-sugar foods and nicotine.  Weight gain.  Trouble pooping (constipation).  Feeling like you may vomit (nausea).  Coughing or a sore throat.  Changes in how the medicines that you take for other issues work in your body.  Depression.  Trouble sleeping (insomnia). Week 3 and afterward After the first 2-3 weeks of quitting, you may start to notice more positive results, such as:  Better sense of smell and taste.  Less coughing and sore throat.  Slower heart rate.  Lower blood pressure.  Clearer skin.  Better breathing.  Fewer sick days. Quitting smoking can be hard. Do not give up if you fail the first time. Some people need to try a few times before they succeed. Do your best to stick to your quit plan, and talk with your doctor if you have any questions or concerns. Summary  Smoking tobacco is the leading cause of preventable death. Quitting smoking can be hard, but it is one of the best things that you can do for your health.  When you decide to quit smoking, make a plan to help you succeed.  Quit smoking right away, not slowly over a period of time.  When you start quitting, seek help from your doctor, family, or friends. This information is not intended to replace advice given to you by your health care provider. Make sure you discuss any questions you have with your health care provider. Document Released: 10/28/2008 Document Revised: 03/21/2018 Document Reviewed: 03/22/2018 Elsevier Patient Education  2020 [employer]  

## 2018-10-21 NOTE — Progress Notes (Signed)
Patient [name], female DOB:08/17/1945, 73 y.o. ULA:[id number]  Chief Complaint  Patient presents with  . Back Pain    HPI  [name] is a 73 y.o. female who has lower back pain.  She has good and bad days. She has mid line pain. She has no new trauma or weakness. She is doing her exercises.   Body mass index is 29.79 kg/m.  ROS  Review of Systems  HENT: Negative for congestion.   Respiratory: Positive for shortness of breath. Negative for cough.   Cardiovascular: Negative for chest pain and leg swelling.  Endocrine: Positive for cold intolerance.  Musculoskeletal: Positive for arthralgias, gait problem, joint swelling and myalgias.  Allergic/Immunologic: Positive for environmental allergies.  All other systems reviewed and are negative.   All other systems reviewed and are negative.  The following is a summary of the past history medically, past history surgically, known current medicines, social history and family history.  This information is gathered electronically by the computer from prior information and documentation.  I review this each visit and have found including this information at this point in the chart is beneficial and informative.    Past Medical History:  Diagnosis Date  . Asthma   . Breast cancer ([place]) 2007   Stage I (T1b N0 M0), grade 1 well-differentiated carcinoma of the left breast status, post lumpectomy followed by radiation therapy. Her estrogen receptor receptors were 93%, progesterone receptors 67%. HER-2/neu was negative. No lymphovascular space invasion was seen. All margins were clear. Ki-67 marker was low at 1% with surgery on 11/15/2004. Treated then with post-lumpectomy radiation, finish  . Breast cancer, left breast ([place]) 2007  . COPD (chronic obstructive pulmonary disease) ([place])   . Diabetes mellitus without complication ([place])   . Diverticula of colon   . Hyperlipidemia   . Hypertension   . Kidney stones   . Nicotine  dependence   . Osteoarthritis     Past Surgical History:  Procedure Laterality Date  . ABDOMINAL HYSTERECTOMY    . BREAST SURGERY  2005 approx   left lumpectomy   . CHOLECYSTECTOMY N/A 05/26/2014   Procedure: LAPAROSCOPIC CHOLECYSTECTOMY;  Surgeon: Aviva Signs Md, MD;  Location: [facility];  Service: General;  Laterality: N/A;  . COLECTOMY     2005, diverticulitis  . COLONOSCOPY N/A 05/20/2017   Procedure: COLONOSCOPY;  Surgeon: Danie Binder, MD;  Location: [facility];  Service: Endoscopy;  Laterality: N/A;  1:45pm  . DILATION AND CURETTAGE OF UTERUS    . ESOPHAGOGASTRODUODENOSCOPY N/A 05/20/2017   Procedure: ESOPHAGOGASTRODUODENOSCOPY (EGD);  Surgeon: Danie Binder, MD;  Location: [facility];  Service: Endoscopy;  Laterality: N/A;  . left breast      cancer, in 2006  . TUBAL LIGATION      Family History  Problem Relation Age of Onset  . Breast cancer Mother   . COPD Father   . Emphysema Father   . Throat cancer Sister   . Glaucoma Brother   . Schizophrenia Brother   . Heart attack Brother   . Lung cancer Sister        former smoker  . Hepatitis C Daughter   . Seizures Daughter   . SIDS Son   . Colon cancer Neg Hx   . Colon polyps Neg Hx     Social History Social History   Tobacco Use  . Smoking status: Current Every Day Smoker    Packs/day: 1.00    Years: 50.00  Pack years: 50.00    Types: Cigarettes  . Smokeless tobacco: Never Used  . Tobacco comment: cigaretes  now 10 to 15 per day  Substance Use Topics  . Alcohol use: No    Alcohol/week: 0.0 standard drinks  . Drug use: No    Allergies  Allergen Reactions  . Penicillins Hives, Shortness Of Breath and Swelling    Has patient had a PCN reaction causing immediate rash, facial/tongue/throat swelling, SOB or lightheadedness with hypotension: yes Has patient had a PCN reaction causing severe rash involving mucus membranes or skin necrosis:no Has patient had a PCN reaction that required  hospitalization: yes Has patient had a PCN reaction occurring within the last 10 years: no If all of the above answers are "NO", then may proceed with Cephalosporin use.  . Sulfonamide Derivatives Other (See Comments)    Shaking all over, seizure like symptoms. Hospitalization resulted     Current Outpatient Medications  Medication Sig Dispense Refill  . acetaminophen (TYLENOL) 325 MG tablet Take 650 mg by mouth every 6 (six) hours as needed.    Marland Kitchen amLODipine (NORVASC) 5 MG tablet TAKE 1 TABLET BY MOUTH ONCE A DAY. 90 tablet 0  . aspirin EC 81 MG tablet Take 81 mg by mouth daily.    . benazepril (LOTENSIN) 40 MG tablet TAKE ONE TABLET BY MOUTH DAILY. 90 tablet 0  . budesonide-formoterol (SYMBICORT) 160-4.5 MCG/ACT inhaler Inhale 2 puffs into the lungs 2 (two) times daily. 1 Inhaler 11  . Calcium Carbonate-Vitamin D (CALCIUM 600 + D PO) Take 1 tablet by mouth 2 (two) times daily. 600/400    . cyclobenzaprine (FLEXERIL) 5 MG tablet TAKE ONE TABLET BY MOUTH AT BEDTIME AS NEEDED. 30 tablet 0  . famotidine (PEPCID) 20 MG tablet     . loratadine (CLARITIN) 10 MG tablet TAKE 1 TABLET BY MOUTH ONCE DAILY FOR ALLERGIES. 30 tablet 0  . montelukast (SINGULAIR) 10 MG tablet Take 1 tablet (10 mg total) by mouth at bedtime. 90 tablet 3  . omeprazole (PRILOSEC) 20 MG capsule Take 20 mg by mouth daily.    . polyethylene glycol powder (GLYCOLAX/MIRALAX) powder MIX 1 CAPFUL IN 8 OUNCES OF JUICE OR WATER AND DRINK ONCE DAILY. 527 g 5  . potassium chloride SA (K-DUR,KLOR-CON) 20 MEQ tablet TAKE 1 TABLET BY MOUTH DAILY. 30 tablet 5  . simvastatin (ZOCOR) 40 MG tablet Take 1 tablet (40 mg total) by mouth at bedtime. 30 tablet 5  . traMADol (ULTRAM) 50 MG tablet TAKE (1) TABLET BY MOUTH EVERY SIX HOURS AS NEEDED. 60 tablet 3  . albuterol (PROVENTIL) (2.5 MG/3ML) 0.083% nebulizer solution INHALE ONE VIAL VIA NEBULIZER THREE TIMES DAILY. (Patient not taking: Reported on 10/02/2018) 300 mL 0  . ATROVENT HFA 17 MCG/ACT  inhaler INHALE 2 PUFFS BY MOUTH FOUR TIMES A DAY. (Patient not taking: Reported on 10/02/2018) 12.9 g 5  . azelastine (ASTELIN) 0.1 % nasal spray PLACE 2 SPRAYS INTO EACH NOSTRIL TWICE DAILY. (Patient not taking: Reported on 10/02/2018) 30 mL 5  . fluticasone (FLONASE) 50 MCG/ACT nasal spray Place 2 sprays into both nostrils daily. (Patient not taking: Reported on 10/02/2018) 16 g 2  . tiZANidine (ZANAFLEX) 4 MG tablet One by mouth every night before bed as needed for spasm (Patient not taking: Reported on 10/02/2018) 30 tablet 3   No current facility-administered medications for this visit.      Physical Exam  Blood pressure (!) 192/82, pulse 99, height _0  (1.651 m), weight 179  lb (81.2 kg).  Constitutional: overall normal hygiene, normal nutrition, well developed, normal grooming, normal body habitus. Assistive device:none  Musculoskeletal: gait and station Limp none, muscle tone and strength are normal, no tremors or atrophy is present.  .  Neurological: coordination overall normal.  Deep tendon reflex/nerve stretch intact.  Sensation normal.  Cranial nerves II-XII intact.   Skin:   Normal overall no scars, lesions, ulcers or rashes. No psoriasis.  Psychiatric: Alert and oriented x 3.  Recent memory intact, remote memory unclear.  Normal mood and affect. Well groomed.  Good eye contact.  Cardiovascular: overall no swelling, no varicosities, no edema bilaterally, normal temperatures of the legs and arms, no clubbing, cyanosis and good capillary refill.  Lymphatic: palpation is normal.  Spine/Pelvis examination:  Inspection:  Overall, sacoiliac joint benign and hips nontender; without crepitus or defects.   Thoracic spine inspection: Alignment normal without kyphosis present   Lumbar spine inspection:  Alignment  with normal lumbar lordosis, without scoliosis apparent.   Thoracic spine palpation:  without tenderness of spinal processes   Lumbar spine palpation: without tenderness  of lumbar area; without tightness of lumbar muscles    Range of Motion:   Lumbar flexion, forward flexion is normal without pain or tenderness    Lumbar extension is full without pain or tenderness   Left lateral bend is normal without pain or tenderness   Right lateral bend is normal without pain or tenderness   Straight leg raising is normal  Strength & tone: normal   Stability overall normal stability  All other systems reviewed and are negative   The patient has been educated about the nature of the problem(s) and counseled on treatment options.  The patient appeared to understand what I have discussed and is in agreement with it.  Encounter Diagnoses  Name Primary?  . Low back pain radiating to right lower extremity Yes  . Cigarette nicotine dependence without complication     PLAN Call if any problems.  Precautions discussed.  Continue current medications.   Return to clinic 3 months   I have reviewed the [place] web site prior to prescribing narcotic medicine for this patient.   Electronically Signed Sanjuana Kava, MD 10/6/202011:27 AM

## 2018-10-27 ENCOUNTER — Ambulatory Visit: Payer: Medicare Other | Admitting: Family Medicine

## 2018-10-31 ENCOUNTER — Ambulatory Visit (INDEPENDENT_AMBULATORY_CARE_PROVIDER_SITE_OTHER): Payer: Medicare Other

## 2018-10-31 ENCOUNTER — Other Ambulatory Visit: Payer: Self-pay

## 2018-10-31 ENCOUNTER — Other Ambulatory Visit: Payer: Self-pay | Admitting: Family Medicine

## 2018-10-31 DIAGNOSIS — R809 Proteinuria, unspecified: Secondary | ICD-10-CM

## 2018-10-31 DIAGNOSIS — R8290 Unspecified abnormal findings in urine: Secondary | ICD-10-CM

## 2018-10-31 LAB — POCT URINALYSIS DIP (CLINITEK)
Blood, UA: NEGATIVE
Glucose, UA: NEGATIVE mg/dL
Nitrite, UA: NEGATIVE
POC PROTEIN,UA: 100 — AB
Spec Grav, UA: 1.02 (ref 1.010–1.025)
Urobilinogen, UA: 0.2 E.U./dL
pH, UA: 5.5 (ref 5.0–8.0)

## 2018-10-31 NOTE — Progress Notes (Signed)
Patient came in and left urine sample after in home nurse advised her she had protein in her urine. Will send for culture since dip is abnormal

## 2018-11-03 ENCOUNTER — Other Ambulatory Visit (HOSPITAL_COMMUNITY)
Admission: RE | Admit: 2018-11-03 | Discharge: 2018-11-03 | Disposition: A | Discharge status: Home / Self Care | Payer: Medicare Other | Source: Ambulatory Visit | Attending: Family Medicine | Admitting: Family Medicine

## 2018-11-03 DIAGNOSIS — R8290 Unspecified abnormal findings in urine: Secondary | ICD-10-CM | POA: Diagnosis not present

## 2018-11-04 ENCOUNTER — Encounter: Payer: Self-pay | Admitting: Family Medicine

## 2018-11-04 ENCOUNTER — Ambulatory Visit (INDEPENDENT_AMBULATORY_CARE_PROVIDER_SITE_OTHER): Payer: Medicare Other | Admitting: Family Medicine

## 2018-11-04 ENCOUNTER — Other Ambulatory Visit: Payer: Self-pay | Admitting: Family Medicine

## 2018-11-04 ENCOUNTER — Other Ambulatory Visit: Payer: Self-pay

## 2018-11-04 DIAGNOSIS — F17218 Nicotine dependence, cigarettes, with other nicotine-induced disorders: Secondary | ICD-10-CM

## 2018-11-04 DIAGNOSIS — J449 Chronic obstructive pulmonary disease, unspecified: Secondary | ICD-10-CM

## 2018-11-04 DIAGNOSIS — N3090 Cystitis, unspecified without hematuria: Secondary | ICD-10-CM

## 2018-11-04 DIAGNOSIS — N3000 Acute cystitis without hematuria: Secondary | ICD-10-CM

## 2018-11-04 MED ORDER — CIPROFLOXACIN HCL 500 MG PO TABS: 500.0000 mg | ORAL_TABLET | Freq: Every day | ORAL | 0 refills | Status: AC

## 2018-11-04 NOTE — Progress Notes (Signed)
Still awaiting culture, but small white blood cells noted. If having symptoms I will go ahead in treat, if not we can wait.

## 2018-11-04 NOTE — Patient Instructions (Signed)
I appreciate the opportunity to provide you with the care for your health and wellness. Today we discussed: cough, and UTI  Follow up: 12/03/2018  No labs or referrals today  Medication has been sent to the pharmacy.  Please take this to help get rid of the start of a UTI.  If your culture proves that you need a different antibiotic I will notify you of this.  Please continue to stay hydrated and drink plenty of water to help wash out the kidneys as well.  Please start using your nicotine patch so that we can see if this is beneficial for you.  We are very much excited for you to start your journey to stop smoking.  And are definitely rooting for you ensuring 40.  Notify us if you can make it 1 day to a week without smoking will be very excited.  Given your cough and mild shortness of breath not being past much of your baseline and you able to talk to me pretty well over the phone.  I think it would be best to refrain from doing any prednisone at this time.  Or any extensive antibiotic use at this time.  If this is a change please let us know.    Please continue to practice social distancing to keep you, your family, and our community safe.  If you must go out, please wear a mask and practice good handwashing.  It was a pleasure to see you and I look forward to continuing to work together on your health and well-being. Please do not hesitate to call the office if you need care or have questions about your care.  Have a wonderful day and week. With Gratitude, Cherly Beach, DNP, AGNP-BC

## 2018-11-04 NOTE — Progress Notes (Signed)
Virtual Visit via Telephone Note   This visit type was conducted due to national recommendations for restrictions regarding the COVID-19 Pandemic (e.g. social distancing) in an effort to limit this patient's exposure and mitigate transmission in our community.  Due to her co-morbid illnesses, this patient is at least at moderate risk for complications without adequate follow up.  This format is felt to be most appropriate for this patient at this time.  The patient did not have access to video technology/had technical difficulties with video requiring transitioning to audio format only (telephone).  All issues noted in this document were discussed and addressed.  No physical exam could be performed with this format.    Evaluation Performed:  Follow-up visit  Date:  11/04/2018   ID:  [name] 04/08/1945, MRN [id number]  Patient Location: Home Provider Location: Office  Location of Patient: Home Location of Provider: Telehealth Consent was obtain for visit to be over via telehealth. I verified that I am speaking with the correct person using two identifiers.  PCP:  [name], MD   Chief Complaint: Increased congestion, urinary frequency and urgency.  History of Present Illness:    [name] is a 73 y.o. female with extensive history of COPD with chronic bronchitis, breast cancer, asthma, hyperlipidemia, hypertension, nicotine dependence, osteoarthritis.  Among others.  Presents today for increased congestion and cough, urinary frequency and urgency.  Reports that she is having a little bit of incontinence secondary to this. Last week she had somebody come in and tell her that she had protein in her urine.  She was told to come into the office and leave a sample so that we can dip it to make sure she did not have infection.  She was positive and it was sent out for culture.  Still waiting on culture at this time.  Today she was notified that she had symptoms  that we would begin to treat her.  As she reported she did not have any symptoms when she left a sample.  She reports today that she has increased frequency and urgency and is having some incontinence.  She denies having any pelvic pain or pressure or burning.  She does report mild back pain but she did have a recent fall.  She denies having any fevers or chills nausea or vomiting.  Cough and congestion started last couple days.  She reports that she thinks her allergies are flaring up.  Secondary to the weather.  She reports she is taking all her medications as directed.  She is able to hold a complete sentence without having any issues.  She reports she has some mild production of mucus.  But not nearly what it could be.  She denies having shortness of breath that is outside of her baseline.  She denies having a cough that is outside of her life baseline.  She reports that she still is smoking at this time but she has the patches.  She wants to quit.  The patient does not have symptoms concerning for COVID-19 infection (fever, chills, cough, or new shortness of breath).   Past Medical, Surgical, Social History, Allergies, and Medications have been Reviewed.   Past Medical History:  Diagnosis Date  . Asthma   . Breast cancer ([place]) 2007   Stage I (T1b N0 M0), grade 1 well-differentiated carcinoma of the left breast status, post lumpectomy followed by radiation therapy. Her estrogen receptor receptors were 93%, progesterone receptors  67%. HER-2/neu was negative. No lymphovascular space invasion was seen. All margins were clear. Ki-67 marker was low at 1% with surgery on 11/15/2004. Treated then with post-lumpectomy radiation, finish  . Breast cancer, left breast ([place]) 2007  . COPD (chronic obstructive pulmonary disease) ([place])   . Diabetes mellitus without complication ([place])   . Diverticula of colon   . Hyperlipidemia   . Hypertension   . Kidney stones   . Nicotine dependence   . Osteoarthritis     Past Surgical History:  Procedure Laterality Date  . ABDOMINAL HYSTERECTOMY    . BREAST SURGERY  2005 approx   left lumpectomy   . CHOLECYSTECTOMY N/A 05/26/2014   Procedure: LAPAROSCOPIC CHOLECYSTECTOMY;  Surgeon: Aviva Signs Md, MD;  Location: [facility];  Service: General;  Laterality: N/A;  . COLECTOMY     2005, diverticulitis  . COLONOSCOPY N/A 05/20/2017   Procedure: COLONOSCOPY;  Surgeon: Danie Binder, MD;  Location: [facility];  Service: Endoscopy;  Laterality: N/A;  1:45pm  . DILATION AND CURETTAGE OF UTERUS    . ESOPHAGOGASTRODUODENOSCOPY N/A 05/20/2017   Procedure: ESOPHAGOGASTRODUODENOSCOPY (EGD);  Surgeon: Danie Binder, MD;  Location: [facility];  Service: Endoscopy;  Laterality: N/A;  . left breast      cancer, in 2006  . TUBAL LIGATION       Current Meds  Medication Sig  . acetaminophen (TYLENOL) 325 MG tablet Take 650 mg by mouth every 6 (six) hours as needed.  Marland Kitchen albuterol (PROVENTIL) (2.5 MG/3ML) 0.083% nebulizer solution INHALE ONE VIAL VIA NEBULIZER THREE TIMES DAILY.  Marland Kitchen amLODipine (NORVASC) 5 MG tablet TAKE 1 TABLET BY MOUTH ONCE A DAY.  Marland Kitchen aspirin EC 81 MG tablet Take 81 mg by mouth daily.  . ATROVENT HFA 17 MCG/ACT inhaler INHALE 2 PUFFS BY MOUTH FOUR TIMES A DAY.  Marland Kitchen azelastine (ASTELIN) 0.1 % nasal spray PLACE 2 SPRAYS INTO EACH NOSTRIL TWICE DAILY.  . benazepril (LOTENSIN) 40 MG tablet TAKE ONE TABLET BY MOUTH DAILY.  . budesonide-formoterol (SYMBICORT) 160-4.5 MCG/ACT inhaler Inhale 2 puffs into the lungs 2 (two) times daily.  . Calcium Carbonate-Vitamin D (CALCIUM 600 + D PO) Take 1 tablet by mouth 2 (two) times daily. 600/400  . ciprofloxacin (CIPRO) 500 MG tablet Take 1 tablet (500 mg total) by mouth daily for 3 days.  . cyclobenzaprine (FLEXERIL) 5 MG tablet TAKE ONE TABLET BY MOUTH AT BEDTIME AS NEEDED.  . famotidine (PEPCID) 20 MG tablet   . fluticasone (FLONASE) 50 MCG/ACT nasal spray Place 2 sprays into both nostrils daily.  Marland Kitchen loratadine  (CLARITIN) 10 MG tablet TAKE 1 TABLET BY MOUTH ONCE DAILY FOR ALLERGIES.  Marland Kitchen montelukast (SINGULAIR) 10 MG tablet Take 1 tablet (10 mg total) by mouth at bedtime.  Marland Kitchen omeprazole (PRILOSEC) 20 MG capsule Take 20 mg by mouth daily.  . polyethylene glycol powder (GLYCOLAX/MIRALAX) powder MIX 1 CAPFUL IN 8 OUNCES OF JUICE OR WATER AND DRINK ONCE DAILY.  Marland Kitchen potassium chloride SA (KLOR-CON) 20 MEQ tablet TAKE 1 TABLET BY MOUTH DAILY.  . simvastatin (ZOCOR) 40 MG tablet Take 1 tablet (40 mg total) by mouth at bedtime.  Marland Kitchen tiZANidine (ZANAFLEX) 4 MG tablet One by mouth every night before bed as needed for spasm  . traMADol (ULTRAM) 50 MG tablet TAKE (1) TABLET BY MOUTH EVERY SIX HOURS AS NEEDED.     Allergies:   Penicillins and Sulfonamide derivatives   Social History   Tobacco Use  . Smoking status: Current Every  Day Smoker    Packs/day: 1.00    Years: 50.00    Pack years: 50.00    Types: Cigarettes  . Smokeless tobacco: Never Used  . Tobacco comment: cigaretes  now 10 to 15 per day  Substance Use Topics  . Alcohol use: No    Alcohol/week: 0.0 standard drinks  . Drug use: No     Family Hx: The patient's family history includes Breast cancer in her mother; COPD in her father; Emphysema in her father; Glaucoma in her brother; Heart attack in her brother; Hepatitis C in her daughter; Lung cancer in her sister; SIDS in her son; Schizophrenia in her brother; Seizures in her daughter; Throat cancer in her sister. There is no history of Colon cancer or Colon polyps.  ROS:   Please see the history of present illness.    All other systems reviewed and are negative.   Labs/Other Tests and Data Reviewed:    Recent Labs: 01/24/2018: TSH 1.49 09/15/2018: B Natriuretic Peptide 42.0 09/25/2018: ALT 15; Hemoglobin 14.0; Platelets 397 10/03/2018: BUN 21; Creat 0.89; Potassium 5.2; Sodium 139   Recent Lipid Panel Lab Results  Component Value Date/Time   CHOL 222 (H) 10/03/2018 11:00 AM   TRIG 155 (H)  10/03/2018 11:00 AM   HDL 85 10/03/2018 11:00 AM   CHOLHDL 2.6 10/03/2018 11:00 AM   LDLCALC 110 (H) 10/03/2018 11:00 AM    Wt Readings from Last 3 Encounters:  10/21/18 179 lb (81.2 kg)  10/02/18 180 lb 9.6 oz (81.9 kg)  09/25/18 179 lb 1.3 oz (81.2 kg)     Objective:    Vital Signs:  There were no vitals taken for this visit.   GEN:  alert and oriented RESPIRATORY:  mild shortness of breath-baseline PSYCH:  normal mood and affect  ASSESSMENT & PLAN:    1. COPD (chronic obstructive pulmonary disease) with chronic bronchitis (HCC) Overall had mild shortness of breath, this is her baseline and communication with her the last year.  Some coughing was noted in conversation as well.  Nothing uncontrolled.  Reports she is using her inhalers and her allergy medications as directed. She has had several treatments with prednisone and antibiotics over the course of the last part of the year.  Will be refrain from giving her any more at this time.  She is getting Cipro for a acute UTI.  She is in agreement with this and understands.  Advised that if she continues to have excessive shortness of breath or producing a lot of extra congestion and mucus.  To call us back.  We can look at doing a short course of prednisone again.  2. Acute cystitis without hematuria Cipro provided, allergies to PCN and Sulfa Dip last Friday, still awaiting cx.  3. Cigarette nicotine dependence with other nicotine-induced disorder Asked about quitting: confirms they are currently smokes cigarettes Advise to quit smoking: Educated about QUITTING to reduce the risk of cancer, cardio and cerebrovascular disease. Assess willingness: Willing to quit at this time, but is working on cutting back and starting patches  Assist with counseling and pharmacotherapy: Counseled for 5 minutes and literature provided. Arrange for follow up:  Working on quitting follow up in 3 months and continue to offer help.    Time:   Today,  I have spent 10  minutes with the patient with telehealth technology discussing the above problems.     Medication Adjustments/Labs and Tests Ordered: Current medicines are reviewed at length with the patient today.  Concerns regarding medicines are outlined above.   Tests Ordered: No orders of the defined types were placed in this encounter.   Medication Changes: No orders of the defined types were placed in this encounter.   Disposition:  Follow up 12/03/2018  Signed, Perlie Mayo, NP  11/04/2018 2:09 PM     [facility]

## 2018-11-05 LAB — URINE CULTURE: Culture: <10000 — AB

## 2018-11-05 NOTE — Progress Notes (Signed)
No growth of bacteria in urine. This is good. Continue hydration and good hygiene.

## 2018-11-06 ENCOUNTER — Other Ambulatory Visit: Payer: Self-pay

## 2018-11-06 ENCOUNTER — Ambulatory Visit (INDEPENDENT_AMBULATORY_CARE_PROVIDER_SITE_OTHER): Payer: Medicare Other | Admitting: Otolaryngology

## 2018-11-06 DIAGNOSIS — R490 Dysphonia: Secondary | ICD-10-CM | POA: Diagnosis not present

## 2018-11-21 ENCOUNTER — Other Ambulatory Visit: Payer: Self-pay | Admitting: Orthopaedic Surgery

## 2018-11-21 ENCOUNTER — Other Ambulatory Visit: Payer: Self-pay | Admitting: Family Medicine

## 2018-11-25 ENCOUNTER — Other Ambulatory Visit: Payer: Self-pay | Admitting: Family Medicine

## 2018-11-26 ENCOUNTER — Other Ambulatory Visit: Payer: Self-pay | Admitting: Family Medicine

## 2018-12-01 ENCOUNTER — Ambulatory Visit: Payer: Medicare Other

## 2018-12-01 NOTE — Telephone Encounter (Signed)
Rx request 

## 2018-12-03 ENCOUNTER — Other Ambulatory Visit: Payer: Self-pay

## 2018-12-03 ENCOUNTER — Encounter: Payer: Self-pay | Admitting: Family Medicine

## 2018-12-03 ENCOUNTER — Ambulatory Visit (INDEPENDENT_AMBULATORY_CARE_PROVIDER_SITE_OTHER): Payer: Medicare Other | Admitting: Family Medicine

## 2018-12-03 VITALS — BP 150/76 | HR 99 | Resp 12 | Ht 65.0 in | Wt 179.0 lb

## 2018-12-03 DIAGNOSIS — Z0000 Encounter for general adult medical examination without abnormal findings: Secondary | ICD-10-CM

## 2018-12-03 DIAGNOSIS — J0110 Acute frontal sinusitis, unspecified: Secondary | ICD-10-CM | POA: Diagnosis not present

## 2018-12-03 MED ORDER — AZITHROMYCIN 250 MG PO TABS: ORAL_TABLET | ORAL | 0 refills | Status: DC

## 2018-12-03 NOTE — Patient Instructions (Signed)
[name] , Thank you for taking time to come for your Medicare Wellness Visit. I appreciate your ongoing commitment to your health goals. Please review the following plan we discussed and let me know if I can assist you in the future.   Please continue to practice social distancing to keep you, your family, and our community safe.  If you must go out, please wear a Mask and practice good handwashing.  We have that you have a safe, happy, healthy holiday season! See you in the new year!!  Screening recommendations/referrals: Colonoscopy: Up-to-date Mammogram: Up-to-date Bone Density: Up-to-date Recommended yearly ophthalmology/optometry visit for glaucoma screening and checkup Recommended yearly dental visit for hygiene and checkup  Vaccinations: Influenza vaccine: Up-to-date Pneumococcal vaccine: Up-to-date Tdap vaccine: Up-to-date Shingles vaccine: Up-to-date  Advanced directives: Unsure paperwork, daughter is in charge if anything happens  Conditions/risks identified: Fall  Next appointment: 02/03/2019    Preventive Care 28 Years and Older, Female Preventive care refers to lifestyle choices and visits with your health care provider that can promote health and wellness. What does preventive care include?  A yearly physical exam. This is also called an annual well check.  Dental exams once or twice a year.  Routine eye exams. Ask your health care provider how often you should have your eyes checked.  Personal lifestyle choices, including:  Daily care of your teeth and gums.  Regular physical activity.  Eating a healthy diet.  Avoiding tobacco and drug use.  Limiting alcohol use.  Practicing safe sex.  Taking low-dose aspirin every day.  Taking vitamin and mineral supplements as recommended by your health care provider. What happens during an annual well check? The services and screenings done by your health care provider during your annual well check will  depend on your age, overall health, lifestyle risk factors, and family history of disease. Counseling  Your health care provider may ask you questions about your:  Alcohol use.  Tobacco use.  Drug use.  Emotional well-being.  Home and relationship well-being.  Sexual activity.  Eating habits.  History of falls.  Memory and ability to understand (cognition).  Work and work Statistician.  Reproductive health. Screening  You may have the following tests or measurements:  Height, weight, and BMI.  Blood pressure.  Lipid and cholesterol levels. These may be checked every 5 years, or more frequently if you are over 11 years old.  Skin check.  Lung cancer screening. You may have this screening every year starting at age 59 if you have a 30-pack-year history of smoking and currently smoke or have quit within the past 15 years.  Fecal occult blood test (FOBT) of the stool. You may have this test every year starting at age 60.  Flexible sigmoidoscopy or colonoscopy. You may have a sigmoidoscopy every 5 years or a colonoscopy every 10 years starting at age 89.  Hepatitis C blood test.  Hepatitis B blood test.  Sexually transmitted disease (STD) testing.  Diabetes screening. This is done by checking your blood sugar (glucose) after you have not eaten for a while (fasting). You may have this done every 1-3 years.  Bone density scan. This is done to screen for osteoporosis. You may have this done starting at age 77.  Mammogram. This may be done every 1-2 years. Talk to your health care provider about how often you should have regular mammograms. Talk with your health care provider about your test results, treatment options, and if necessary, the need for more tests.  Vaccines  Your health care provider may recommend certain vaccines, such as:  Influenza vaccine. This is recommended every year.  Tetanus, diphtheria, and acellular pertussis (Tdap, Td) vaccine. You may need a  Td booster every 10 years.  Zoster vaccine. You may need this after age 39.  Pneumococcal 13-valent conjugate (PCV13) vaccine. One dose is recommended after age 33.  Pneumococcal polysaccharide (PPSV23) vaccine. One dose is recommended after age 1. Talk to your health care provider about which screenings and vaccines you need and how often you need them. This information is not intended to replace advice given to you by your health care provider. Make sure you discuss any questions you have with your health care provider. Document Released: 01/28/2015 Document Revised: 09/21/2015 Document Reviewed: 11/02/2014 Elsevier Interactive Patient Education  2017 [place] Prevention in the Home Falls can cause injuries. They can happen to people of all ages. There are many things you can do to make your home safe and to help prevent falls. What can I do on the outside of my home?  Regularly fix the edges of walkways and driveways and fix any cracks.  Remove anything that might make you trip as you walk through a door, such as a raised step or threshold.  Trim any bushes or trees on the path to your home.  Use bright outdoor lighting.  Clear any walking paths of anything that might make someone trip, such as rocks or tools.  Regularly check to see if handrails are loose or broken. Make sure that both sides of any steps have handrails.  Any raised decks and porches should have guardrails on the edges.  Have any leaves, snow, or ice cleared regularly.  Use sand or salt on walking paths during winter.  Clean up any spills in your garage right away. This includes oil or grease spills. What can I do in the bathroom?  Use night lights.  Install grab bars by the toilet and in the tub and shower. Do not use towel bars as grab bars.  Use non-skid mats or decals in the tub or shower.  If you need to sit down in the shower, use a plastic, non-slip stool.  Keep the floor dry. Clean  up any water that spills on the floor as soon as it happens.  Remove soap buildup in the tub or shower regularly.  Attach bath mats securely with double-sided non-slip rug tape.  Do not have throw rugs and other things on the floor that can make you trip. What can I do in the bedroom?  Use night lights.  Make sure that you have a light by your bed that is easy to reach.  Do not use any sheets or blankets that are too big for your bed. They should not hang down onto the floor.  Have a firm chair that has side arms. You can use this for support while you get dressed.  Do not have throw rugs and other things on the floor that can make you trip. What can I do in the kitchen?  Clean up any spills right away.  Avoid walking on wet floors.  Keep items that you use a lot in easy-to-reach places.  If you need to reach something above you, use a strong step stool that has a grab bar.  Keep electrical cords out of the way.  Do not use floor polish or wax that makes floors slippery. If you must use wax, use non-skid floor wax.  Do not have throw rugs and other things on the floor that can make you trip. What can I do with my stairs?  Do not leave any items on the stairs.  Make sure that there are handrails on both sides of the stairs and use them. Fix handrails that are broken or loose. Make sure that handrails are as long as the stairways.  Check any carpeting to make sure that it is firmly attached to the stairs. Fix any carpet that is loose or worn.  Avoid having throw rugs at the top or bottom of the stairs. If you do have throw rugs, attach them to the floor with carpet tape.  Make sure that you have a light switch at the top of the stairs and the bottom of the stairs. If you do not have them, ask someone to add them for you. What else can I do to help prevent falls?  Wear shoes that:  Do not have high heels.  Have rubber bottoms.  Are comfortable and fit you well.  Are  closed at the toe. Do not wear sandals.  If you use a stepladder:  Make sure that it is fully opened. Do not climb a closed stepladder.  Make sure that both sides of the stepladder are locked into place.  Ask someone to hold it for you, if possible.  Clearly mark and make sure that you can see:  Any grab bars or handrails.  First and last steps.  Where the edge of each step is.  Use tools that help you move around (mobility aids) if they are needed. These include:  Canes.  Walkers.  Scooters.  Crutches.  Turn on the lights when you go into a dark area. Replace any light bulbs as soon as they burn out.  Set up your furniture so you have a clear path. Avoid moving your furniture around.  If any of your floors are uneven, fix them.  If there are any pets around you, be aware of where they are.  Review your medicines with your doctor. Some medicines can make you feel dizzy. This can increase your chance of falling. Ask your doctor what other things that you can do to help prevent falls. This information is not intended to replace advice given to you by your health care provider. Make sure you discuss any questions you have with your health care provider. Document Released: 10/28/2008 Document Revised: 06/09/2015 Document Reviewed: 02/05/2014 Elsevier Interactive Patient Education  2017 [employer].

## 2018-12-03 NOTE — Progress Notes (Signed)
Subjective:   [name] is a 73 y.o. female who presents for Medicare Annual (Subsequent) preventive examination as well as sinus infection.   Location of Patient: Home Location of Provider: Telehealth Consent was obtain for visit to be over via telehealth.  I verified that I am speaking with the correct person using two identifiers.   Review of Systems:   Sinuses: Congestion, runny nose, sinus pressure over the head, sinus pain, sore throat, voice change, ear discomfort.  Baseline cough and shortness of breath.  Review of Systems  Constitutional: Positive for fatigue. Negative for activity change, appetite change, chills and fever.  HENT: Positive for congestion, ear pain, rhinorrhea, sinus pressure, sinus pain, sore throat and voice change. Negative for trouble swallowing.   Eyes: Negative.  Negative for discharge, itching and visual disturbance.  Respiratory: Positive for cough and shortness of breath. Negative for chest tightness and wheezing.   Cardiovascular: Negative for chest pain, palpitations and leg swelling.  Gastrointestinal: Negative.   Endocrine: Negative.   Genitourinary: Negative.   Musculoskeletal: Negative.   Skin: Negative.   Allergic/Immunologic: Negative.   Neurological: Positive for headaches. Negative for dizziness.  Hematological: Negative.   Psychiatric/Behavioral: Negative.   All other systems reviewed and are negative.     Cardiac Risk Factors include: advanced age (>42mn, >>47women);dyslipidemia;hypertension     Objective:     Vitals: BP (!) 150/76   Pulse 99   Resp 12   Ht 5' 5" (1.651 m)   Wt 179 lb (81.2 kg)   BMI 29.79 kg/m   Body mass index is 29.79 kg/m.    Physical Exam Vitals signs reviewed.  HENT:     Mouth/Throat:     Comments: Hoarseness of voice Pulmonary:     Comments: No noted shortness of breath in conversation Neurological:     Mental Status: She is alert.  Psychiatric:        Mood and Affect: Mood  normal.      Advanced Directives 10/02/2018 09/15/2018 12/10/2017 11/25/2017 09/17/2017 05/20/2017 04/10/2017  Does Patient Have a Medical Advance Directive? No No Yes Yes Yes No Yes  Type of Advance Directive - - Living will Living will [place]  Does patient want to make changes to medical advance directive? - - - No - Patient declined No - Patient declined - -  Copy of [place]? - - - - No - copy requested - No - copy requested  Would patient like information on creating a medical advance directive? No - Patient declined - - - - No - Patient declined -    Tobacco Social History   Tobacco Use  Smoking Status Current Every Day Smoker  . Packs/day: 1.00  . Years: 50.00  . Pack years: 50.00  . Types: Cigarettes  Smokeless Tobacco Never Used  Tobacco Comment   cigaretes  now 10 to 15 per day     Ready to quit: Yes Counseling given: Yes Comment: cigaretes  now 10 to 15 per day   Clinical Intake:  Pre-visit preparation completed: Yes  Pain : 0-10 Pain Score: 8  Pain Type: Acute pain Pain Location: Head Pain Descriptors / Indicators: Dull Pain Onset: Today Pain Frequency: Constant Pain Relieving Factors: tylenol and tramadol and a cold pack Effect of Pain on Daily Activities: some  Pain Relieving Factors: tylenol and tramadol and a cold pack  BMI - recorded: 29.79 Nutritional Status:  BMI 25 -29 Overweight Nutritional Risks: None Diabetes: No  How often do you need to have someone help you when you read instructions, pamphlets, or other written materials from your doctor or pharmacy?: 1 - Never What is the last grade level you completed in school?: 9  Interpreter Needed?: No     Past Medical History:  Diagnosis Date  . Asthma   . Breast cancer ([place]) 2007   Stage I (T1b N0 M0), grade 1 well-differentiated carcinoma of the left breast status, post lumpectomy followed  by radiation therapy. Her estrogen receptor receptors were 93%, progesterone receptors 67%. HER-2/neu was negative. No lymphovascular space invasion was seen. All margins were clear. Ki-67 marker was low at 1% with surgery on 11/15/2004. Treated then with post-lumpectomy radiation, finish  . Breast cancer, left breast ([place]) 2007  . COPD (chronic obstructive pulmonary disease) ([place])   . Diabetes mellitus without complication ([place])   . Diverticula of colon   . Hyperlipidemia   . Hypertension   . Kidney stones   . Nicotine dependence   . Osteoarthritis    Past Surgical History:  Procedure Laterality Date  . ABDOMINAL HYSTERECTOMY    . BREAST SURGERY  2005 approx   left lumpectomy   . CHOLECYSTECTOMY N/A 05/26/2014   Procedure: LAPAROSCOPIC CHOLECYSTECTOMY;  Surgeon: Aviva Signs Md, MD;  Location: [facility] ORS;  Service: General;  Laterality: N/A;  . COLECTOMY     2005, diverticulitis  . COLONOSCOPY N/A 05/20/2017   Procedure: COLONOSCOPY;  Surgeon: Danie Binder, MD;  Location: [facility] END[facility] SUITE;  Service: Endoscopy;  Laterality: N/A;  1:45pm  . DILATION AND CURETTAGE OF UTERUS    . ESOPHAGOGASTRODUODENOSCOPY N/A 05/20/2017   Procedure: ESOPHAGOGASTRODUODENOSCOPY (EGD);  Surgeon: Danie Binder, MD;  Location: [facility];  Service: Endoscopy;  Laterality: N/A;  . left breast      cancer, in 2006  . TUBAL LIGATION     Family History  Problem Relation Age of Onset  . Breast cancer Mother   . COPD Father   . Emphysema Father   . Throat cancer Sister   . Glaucoma Brother   . Schizophrenia Brother   . Heart attack Brother   . Lung cancer Sister        former smoker  . Hepatitis C Daughter   . Seizures Daughter   . SIDS Son   . Colon cancer Neg Hx   . Colon polyps Neg Hx    Social History   Socioeconomic History  . Marital status: Single    Spouse name: Not on file  . Number of children: 5  . Years of education: 9th grade   . Highest education level: 9th grade  Occupational  History  . Occupation: retired    Fish farm manager: RETIRED  Social Needs  . Financial resource strain: Not very hard  . Food insecurity    Worry: Sometimes true    Inability: Sometimes true  . Transportation needs    Medical: No    Non-medical: No  Tobacco Use  . Smoking status: Current Every Day Smoker    Packs/day: 1.00    Years: 50.00    Pack years: 50.00    Types: Cigarettes  . Smokeless tobacco: Never Used  . Tobacco comment: cigaretes  now 10 to 15 per day  Substance and Sexual Activity  . Alcohol use: No    Alcohol/week: 0.0 standard drinks  . Drug use: No  . Sexual activity: Not Currently  Lifestyle  .  Physical activity    Days per week: 7 days    Minutes per session: 30 min  . Stress: Only a little  Relationships  . Social Herbalist on phone: Twice a week    Gets together: Never    Attends religious service: Never    Active member of club or organization: No    Attends meetings of clubs or organizations: Never    Relationship status: Divorced  Other Topics Concern  . 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 QUIT WORKING 2003 AFTER CANCER DIAGNOSIS.      MARRIED WITH A RUN AWAY HUSBAND(FUGITIVE 10 YRS). 3 MISCARRIAGES, 5 CHILDREN    Outpatient Encounter Medications as of 12/03/2018  Medication Sig  . acetaminophen (TYLENOL) 325 MG tablet Take 650 mg by mouth every 6 (six) hours as needed.  Marland Kitchen albuterol (PROVENTIL) (2.5 MG/3ML) 0.083% nebulizer solution INHALE ONE VIAL VIA NEBULIZER THREE TIMES DAILY.  Marland Kitchen amLODipine (NORVASC) 5 MG tablet TAKE 1 TABLET BY MOUTH ONCE A DAY.  Marland Kitchen aspirin EC 81 MG tablet Take 81 mg by mouth daily.  . ATROVENT HFA 17 MCG/ACT inhaler INHALE 2 PUFFS BY MOUTH FOUR TIMES A DAY.  Marland Kitchen azelastine (ASTELIN) 0.1 % nasal spray PLACE 2 SPRAYS INTO EACH NOSTRIL TWICE DAILY.  . benazepril (LOTENSIN) 40 MG tablet TAKE ONE TABLET BY MOUTH DAILY.  . Calcium Carbonate-Vitamin D  (CALCIUM 600 + D PO) Take 1 tablet by mouth 2 (two) times daily. 600/400  . cyclobenzaprine (FLEXERIL) 5 MG tablet TAKE ONE TABLET BY MOUTH AT BEDTIME AS NEEDED.  . famotidine (PEPCID) 20 MG tablet   . fluticasone (FLONASE) 50 MCG/ACT nasal spray Place 2 sprays into both nostrils daily.  Marland Kitchen loratadine (CLARITIN) 10 MG tablet TAKE 1 TABLET BY MOUTH ONCE DAILY FOR ALLERGIES.  Marland Kitchen montelukast (SINGULAIR) 10 MG tablet TAKE 1 TABLET BY MOUTH AT BEDTIME.  Marland Kitchen omeprazole (PRILOSEC) 20 MG capsule Take 20 mg by mouth daily.  . polyethylene glycol powder (GLYCOLAX/MIRALAX) powder MIX 1 CAPFUL IN 8 OUNCES OF JUICE OR WATER AND DRINK ONCE DAILY.  Marland Kitchen potassium chloride SA (KLOR-CON) 20 MEQ tablet TAKE 1 TABLET BY MOUTH DAILY.  . simvastatin (ZOCOR) 20 MG tablet TAKE (1) TABLET BY MOUTH DAILY AT 6 PM FOR CHOLESTEROL.  Marland Kitchen simvastatin (ZOCOR) 40 MG tablet Take 1 tablet (40 mg total) by mouth at bedtime.  . SYMBICORT 160-4.5 MCG/ACT inhaler INHALE 2 PUFFS INTO THE LUNGS TWICE DAILY.  Marland Kitchen tiZANidine (ZANAFLEX) 4 MG tablet TAKE ONE TABLET AT NIGHT BEFORE BED AS NEEDED FOR MUSCLE SPASMS.  Marland Kitchen traMADol (ULTRAM) 50 MG tablet TAKE (1) TABLET BY MOUTH EVERY SIX HOURS AS NEEDED.   No facility-administered encounter medications on file as of 12/03/2018.     Activities of Daily Living In your present state of health, do you have any difficulty performing the following activities: 12/03/2018  Hearing? N  Vision? N  Difficulty concentrating or making decisions? Y  Walking or climbing stairs? N  Dressing or bathing? N  Doing errands, shopping? N  Preparing Food and eating ? N  Using the Toilet? N  In the past six months, have you accidently leaked urine? N  Do you have problems with loss of bowel control? N  Managing your Medications? N  Managing your Finances? N  Housekeeping or managing your Housekeeping? Y  Some recent data might be hidden    Patient Care Team: [name],  MD as PCP - General Branch,  Alphonse Guild, MD as PCP - Cardiology (Cardiology) Sanjuana Kava, MD as Consulting Physician (Orthopedic Surgery) Patrici Ranks, MD (Inactive) as Consulting Physician (Hematology and Oncology) Danie Binder, MD as Consulting Physician (Gastroenterology)    Assessment:   This is a routine wellness examination for [place].  Exercise Activities and Dietary recommendations Current Exercise Habits: Home exercise routine, Type of exercise: calisthenics;Other - see comments(stuff like jumping jacks), Time (Minutes): 10, Frequency (Times/Week): 7, Weekly Exercise (Minutes/Week): 70, Intensity: Mild, Exercise limited by: None identified  Goals    . Increase physical activity    . Quit smoking / using tobacco     Starting 01/04/2016 patient would like to cut back to 4 cigarettes a day.       Fall Risk Fall Risk  12/03/2018 11/04/2018 09/25/2018 09/09/2018 09/02/2018  Falls in the past year? _0 0  Number falls in past yr: 1 0 1 1 0  Injury with Fall? 1 0 1 1 0  Risk for fall due to : - - - - -  Follow up - - - - -   Is the patient's home free of loose throw rugs in walkways, pet beds, electrical cords, etc?   yes      Grab bars in the bathroom? yes      Handrails on the stairs?   yes      Adequate lighting?   yes     Depression Screen PHQ 2/9 Scores 12/03/2018 11/04/2018 09/25/2018 09/09/2018  PHQ - 2 Score 0 0 0 0  PHQ- 9 Score - - - -     Cognitive Function     6CIT Screen 12/03/2018 11/25/2017 11/12/2016 01/04/2016  What Year? 0 points 0 points 0 points 0 points  What month? 0 points 0 points 0 points 0 points  What time? 0 points 0 points 0 points 0 points  Count back from 20 0 points 0 points 0 points 0 points  Months in reverse 2 points 2 points 0 points 0 points  Repeat phrase 4 points 0 points 0 points 0 points  Total Score 6 2 0 0    Immunization History  Administered Date(s) Administered  . Fluad Quad(high Dose 65+) 09/09/2018  . Influenza Split 10/31/2011   . Influenza Whole 11/18/2008, 10/12/2010  . Influenza,inj,Quad PF,6+ Mos 11/12/2012, 09/18/2013, 12/22/2014, 10/04/2015, 09/09/2017  . PPD Test 01/28/2012  . Pneumococcal Conjugate-13 01/04/2015  . Pneumococcal Polysaccharide-23 06/13/2010  . Tdap 01/28/2012  . Zoster Recombinat (Shingrix) 11/04/2017, 01/24/2018    Qualifies for Shingles Vaccine? completed Screening Tests Health Maintenance  Topic Date Due  . OPHTHALMOLOGY EXAM  02/26/2020 (Originally 09/07/2015)  . FOOT EXAM  10/06/2020 (Originally 01/23/2017)  . MAMMOGRAM  05/28/2020  . TETANUS/TDAP  01/27/2022  . COLONOSCOPY  05/21/2027  . INFLUENZA VACCINE  Completed  . DEXA SCAN  Completed  . Hepatitis C Screening  Completed  . PNA vac Low Risk Adult  Completed  . HEMOGLOBIN A1C  Discontinued    Cancer Screenings: Lung: Low Dose CT Chest recommended if Age 51-80 years, 30 pack-year currently smoking OR have quit w/in 15years. Patient does qualify. Breast:  Up to date on Mammogram? Yes   Up to date of Bone Density/Dexa? Yes Colorectal: up to date  Additional Screenings:   Hepatitis C Screening: completed     Plan:       1. Encounter for Medicare annual wellness exam  I have personally reviewed and noted  the following in the patient's chart:   . Medical and social history . Use of alcohol, tobacco or illicit drugs  . Current medications and supplements . Functional ability and status . Nutritional status . Physical activity . Advanced directives . List of other physicians . Hospitalizations, surgeries, and ER visits in previous 12 months . Vitals . Screenings to include cognitive, depression, and falls . Referrals and appointments  In addition, I have reviewed and discussed with patient certain preventive protocols, quality metrics, and best practice recommendations. A written personalized care plan for preventive services as well as general preventive health recommendations were provided to patient.       2. Acute non-recurrent frontal sinusitis Signs and symptoms of sinusitis.  Treatment with Zithromax provided.  Advised to follow-up if symptoms do not improve after completing course of antibiotic.   Reviewed side effects, risks and benefits of medication.   Patient acknowledged agreement and understanding of the plan.   - azithromycin (ZITHROMAX) 250 MG tablet; Take 2 tablets (500 mg) on the first day. Then take 1 tablet (253m) on days 2-5.  Dispense: 6 tablet; Refill: 0    I provided 30 minutes of non-face-to-face time during this encounter.   HPerlie Mayo NP  12/03/2018

## 2018-12-23 ENCOUNTER — Telehealth: Payer: Self-pay | Admitting: Orthopaedic Surgery

## 2018-12-23 MED ORDER — TRAMADOL HCL 50 MG PO TABS: ORAL_TABLET | ORAL | 3 refills | Status: DC

## 2018-12-23 NOTE — Telephone Encounter (Signed)
Refill on Tramadol 50  Mgs.    Sig: TAKE (1) TABLET BY MOUTH EVERY SIX HOURS AS NEEDED.  Patient uses [facility]

## 2018-12-29 ENCOUNTER — Other Ambulatory Visit: Payer: Self-pay | Admitting: Family Medicine

## 2018-12-30 ENCOUNTER — Other Ambulatory Visit: Payer: Self-pay | Admitting: Family Medicine

## 2019-01-12 ENCOUNTER — Other Ambulatory Visit: Payer: Self-pay | Admitting: Family Medicine

## 2019-01-14 ENCOUNTER — Other Ambulatory Visit: Payer: Self-pay

## 2019-01-14 ENCOUNTER — Ambulatory Visit: Payer: Medicare Other | Attending: Internal Medicine

## 2019-01-14 DIAGNOSIS — Z20822 Contact with and (suspected) exposure to covid-19: Secondary | ICD-10-CM

## 2019-01-15 LAB — NOVEL CORONAVIRUS, NAA: SARS-CoV-2, NAA: NOT DETECTED

## 2019-01-18 ENCOUNTER — Telehealth: Payer: Self-pay

## 2019-01-18 NOTE — Telephone Encounter (Signed)

## 2019-01-20 ENCOUNTER — Ambulatory Visit (INDEPENDENT_AMBULATORY_CARE_PROVIDER_SITE_OTHER): Payer: Medicare Other | Admitting: Orthopaedic Surgery

## 2019-01-20 ENCOUNTER — Encounter: Payer: Self-pay | Admitting: Orthopaedic Surgery

## 2019-01-20 ENCOUNTER — Other Ambulatory Visit: Payer: Self-pay

## 2019-01-20 VITALS — BP 176/83 | HR 92 | Ht 65.0 in | Wt 176.0 lb

## 2019-01-20 DIAGNOSIS — M7061 Trochanteric bursitis, right hip: Secondary | ICD-10-CM

## 2019-01-20 NOTE — Progress Notes (Signed)
PROCEDURE NOTE:  The patient request injection, verbal consent was obtained.  The right trochanteric area of the hip was prepped appropriately after time out was performed.   Sterile technique was observed and injection of 1 cc of Depo-Medrol 40 mg with several cc's of plain xylocaine. Anesthesia was provided by ethyl chloride and a 20-gauge needle was used to inject the hip area. The injection was tolerated well.  A band aid dressing was applied.  The patient was advised to apply ice later today and tomorrow to the injection sight as needed.  Return in one month.  Call if any problem.  Precautions discussed.   Electronically Signed Sanjuana Kava, MD 1/5/20219:36 AM

## 2019-01-22 ENCOUNTER — Ambulatory Visit (INDEPENDENT_AMBULATORY_CARE_PROVIDER_SITE_OTHER): Payer: Medicare Other | Admitting: Family Medicine

## 2019-01-22 ENCOUNTER — Other Ambulatory Visit: Payer: Self-pay

## 2019-01-22 VITALS — BP 176/83 | Ht 65.0 in | Wt 176.0 lb

## 2019-01-22 DIAGNOSIS — I10 Essential (primary) hypertension: Secondary | ICD-10-CM

## 2019-01-22 DIAGNOSIS — J209 Acute bronchitis, unspecified: Secondary | ICD-10-CM

## 2019-01-22 DIAGNOSIS — F17210 Nicotine dependence, cigarettes, uncomplicated: Secondary | ICD-10-CM | POA: Diagnosis not present

## 2019-01-22 DIAGNOSIS — J440 Chronic obstructive pulmonary disease with acute lower respiratory infection: Secondary | ICD-10-CM | POA: Diagnosis not present

## 2019-01-22 DIAGNOSIS — F17200 Nicotine dependence, unspecified, uncomplicated: Secondary | ICD-10-CM

## 2019-01-22 DIAGNOSIS — J449 Chronic obstructive pulmonary disease, unspecified: Secondary | ICD-10-CM

## 2019-01-22 DIAGNOSIS — E785 Hyperlipidemia, unspecified: Secondary | ICD-10-CM | POA: Diagnosis not present

## 2019-01-22 DIAGNOSIS — J4489 Other specified chronic obstructive pulmonary disease: Secondary | ICD-10-CM

## 2019-01-22 MED ORDER — BENZONATATE 100 MG PO CAPS: 100.0000 mg | ORAL_CAPSULE | Freq: Two times a day (BID) | ORAL | 1 refills | Status: DC | PRN

## 2019-01-22 MED ORDER — DOXYCYCLINE HYCLATE 100 MG PO TABS: 100.0000 mg | ORAL_TABLET | Freq: Two times a day (BID) | ORAL | 0 refills | Status: DC

## 2019-01-22 NOTE — Progress Notes (Signed)
Virtual Visit via Telephone Note  I connected with [name] on 01/22/19 at  2:40 PM EST by telephone and verified that I am speaking with the correct person using two identifiers.  Location: Patient: home  Provider: office   I discussed the limitations, risks, security and privacy concerns of performing an evaluation and management service by telephone and the availability of in person appointments. I also discussed with the patient that there may be a patient responsible charge related to this service. The patient expressed understanding and agreed to proceed.   History of Present Illness: 1 week h/o cough productive of yellow sputum, 2 week h/o nasal drainage clear, left ear pain and no h/o fever  F/u chronic problems medication review, update labs and preventive health re throat. Denies chest congestion, productive cough or wheezing. Denies chest pains, palpitations and leg swelling Denies abdominal pain, nausea, vomiting,diarrhea or constipation.   Denies dysuria, frequency, hesitancy or incontinence. C/o chronic joint pain, swelling and limitation in mobility. Denies headaches, seizures, numbness, or tingling. Denies depression,  C/o chronic anxiety and mild  insomnia. Denies skin break down or rash.       Observations/Objective: BP (!) 176/83   Ht 5\' 5"  (1.651 m)   Wt 176 lb (79.8 kg)   BMI 29.29 kg/m  Good communication with no confusion and intact memory. Alert and oriented x 3 Excessive deep cough during speech    Assessment and Plan: Acute bronchitis with COPD (HCC) Doxycycline and tessalon perles prescribed  COPD (chronic obstructive pulmonary disease) with chronic bronchitis (HCC) Acute flare with infection, pt to use inhalers more regularly, short term  Hypertension goal BP (blood pressure) < 130/80 Markedly elvated SBP, will need sooner office veal DASH diet and commitment to daily physical activity for a minimum of 30 minutes discussed and  encouraged, as a part of hypertension management. The importance of attaining a healthy weight is also discussed.  BP/Weight 01/22/2019 01/20/2019 12/03/2018 10/21/2018 10/02/2018 [id number]  Systolic BP 0000000 0000000 [id number] 0000000 Q000111Q 0000000  Diastolic BP 83 83 76 82 89 76 65  Wt. (Lbs) 176 176 179 179 180.6 179.08 167  BMI 29.29 29.29 29.79 29.79 30.05 29.8 27.79       Hyperlipidemia LDL goal <100 Hyperlipidemia:Low fat diet discussed and encouraged.   Lipid Panel  Lab Results  Component Value Date   CHOL 222 (H) 10/03/2018   HDL 85 10/03/2018   LDLCALC 110 (H) 10/03/2018   TRIG 155 (H) 10/03/2018   CHOLHDL 2.6 10/03/2018   Uncontrolled and updated lab needed    NICOTINE ADDICTION Asked:confirms currently smokes cigarettes Assess: Unwilling to quit but cutting back Advise: needs to QUIT to reduce risk of cancer, cardio and cerebrovascular disease Assist: counseled for 5 minutes and literature provided Arrange: follow up in 3 months     Follow Up Instructions:    I discussed the assessment and treatment plan with the patient. The patient was provided an opportunity to ask questions and all were answered. The patient agreed with the plan and demonstrated an understanding of the instructions.   The patient was advised to call back or seek an in-person evaluation if the symptoms worsen or if the condition fails to improve as anticipated.  I provided 21 minutes of non-face-to-face time during this encounter.   [name], MD

## 2019-01-22 NOTE — Patient Instructions (Addendum)
F/U in office with MD in 5 months, call if you need me before  You are treated for acute bronchitis and tessalon perles and doxycycline ARE PRESCRIBED  PLEASE WORK ON SMOKING CESSATION EVEN THOUGH YOU ARE NOT READY TO QUIT  PLEASE SUBMIT SPUTUM FOR C/S ( [facility]) NEXT WEEK WHEN YOU GO FOR BLOOD WORK  ORDER IS BEING ENTERED  Thanks for choosing [facility], we consider it a privelige to serve you.

## 2019-01-24 ENCOUNTER — Encounter: Payer: Self-pay | Admitting: Family Medicine

## 2019-01-24 NOTE — Assessment & Plan Note (Signed)
Markedly elvated SBP, will need sooner office veal DASH diet and commitment to daily physical activity for a minimum of 30 minutes discussed and encouraged, as a part of hypertension management. The importance of attaining a healthy weight is also discussed.  BP/Weight 01/22/2019 01/20/2019 12/03/2018 10/21/2018 10/02/2018 [id number]  Systolic BP 0000000 0000000 [id number] 0000000 Q000111Q 0000000  Diastolic BP 83 83 76 82 89 76 65  Wt. (Lbs) 176 176 179 179 180.6 179.08 167  BMI 29.29 29.29 29.79 29.79 30.05 29.8 27.79

## 2019-01-24 NOTE — Assessment & Plan Note (Signed)
Asked:confirms currently smokes cigarettes Assess: Unwilling to quit but cutting back Advise: needs to QUIT to reduce risk of cancer, cardio and cerebrovascular disease Assist: counseled for 5 minutes and literature provided Arrange: follow up in 3 months  

## 2019-01-24 NOTE — Assessment & Plan Note (Signed)
Doxycycline and tessalon perles prescribed

## 2019-01-24 NOTE — Assessment & Plan Note (Signed)
Acute flare with infection, pt to use inhalers more regularly, short term

## 2019-01-24 NOTE — Assessment & Plan Note (Signed)
Hyperlipidemia:Low fat diet discussed and encouraged.   Lipid Panel  Lab Results  Component Value Date   CHOL 222 (H) 10/03/2018   HDL 85 10/03/2018   LDLCALC 110 (H) 10/03/2018   TRIG 155 (H) 10/03/2018   CHOLHDL 2.6 10/03/2018   Uncontrolled and updated lab needed

## 2019-01-28 DIAGNOSIS — E785 Hyperlipidemia, unspecified: Secondary | ICD-10-CM | POA: Diagnosis not present

## 2019-01-28 LAB — HEPATIC FUNCTION PANEL
AG Ratio: 1.6 (calc) (ref 1.0–2.5)
ALT: 15 U/L (ref 6–29)
AST: 15 U/L (ref 10–35)
Albumin: 4.5 g/dL (ref 3.6–5.1)
Alkaline phosphatase (APISO): 78 U/L (ref 37–153)
Bilirubin, Direct: 0.2 mg/dL (ref 0.0–0.2)
Globulin: 2.8 g/dL (calc) (ref 1.9–3.7)
Indirect Bilirubin: 0.5 mg/dL (calc) (ref 0.2–1.2)
Total Bilirubin: 0.7 mg/dL (ref 0.2–1.2)
Total Protein: 7.3 g/dL (ref 6.1–8.1)

## 2019-01-28 LAB — LIPID PANEL
Cholesterol: 188 mg/dL (ref <200)
HDL: 82 mg/dL (ref >=50)
LDL Cholesterol (Calc): 87 mg/dL (calc)
Non-HDL Cholesterol (Calc): 106 mg/dL (calc) (ref <130)
Total CHOL/HDL Ratio: 2.3 (calc) (ref <5.0)
Triglycerides: 101 mg/dL (ref <150)

## 2019-02-03 ENCOUNTER — Ambulatory Visit: Payer: Medicare Other | Admitting: Family Medicine

## 2019-02-04 DIAGNOSIS — J209 Acute bronchitis, unspecified: Secondary | ICD-10-CM | POA: Diagnosis not present

## 2019-02-06 LAB — RESPIRATORY CULTURE OR RESPIRATORY AND SPUTUM CULTURE: MICRO NUMBER:: 10063119

## 2019-02-09 ENCOUNTER — Other Ambulatory Visit: Payer: Self-pay

## 2019-02-09 DIAGNOSIS — J209 Acute bronchitis, unspecified: Secondary | ICD-10-CM

## 2019-02-11 ENCOUNTER — Telehealth: Payer: Self-pay | Admitting: *Deleted

## 2019-02-11 ENCOUNTER — Other Ambulatory Visit: Payer: Self-pay

## 2019-02-11 MED ORDER — BUDESONIDE-FORMOTEROL FUMARATE 160-4.5 MCG/ACT IN AERO: 2.0000 | INHALATION_SPRAY | Freq: Two times a day (BID) | RESPIRATORY_TRACT | 3 refills | Status: DC

## 2019-02-11 NOTE — Telephone Encounter (Signed)
Pt called needing a refill on her symbicort sent to [place] she is out of the medication

## 2019-02-11 NOTE — Telephone Encounter (Signed)
Medication refilled and sent to pharmacy.

## 2019-02-17 ENCOUNTER — Encounter: Payer: Self-pay | Admitting: Orthopaedic Surgery

## 2019-02-17 ENCOUNTER — Ambulatory Visit: Payer: Medicare Other

## 2019-02-17 ENCOUNTER — Other Ambulatory Visit: Payer: Self-pay

## 2019-02-17 ENCOUNTER — Ambulatory Visit (INDEPENDENT_AMBULATORY_CARE_PROVIDER_SITE_OTHER): Payer: Medicare Other | Admitting: Orthopaedic Surgery

## 2019-02-17 VITALS — Ht 65.0 in | Wt 169.0 lb

## 2019-02-17 DIAGNOSIS — G8929 Other chronic pain: Secondary | ICD-10-CM

## 2019-02-17 DIAGNOSIS — F17210 Nicotine dependence, cigarettes, uncomplicated: Secondary | ICD-10-CM

## 2019-02-17 DIAGNOSIS — M545 Low back pain, unspecified: Secondary | ICD-10-CM

## 2019-02-17 DIAGNOSIS — M7061 Trochanteric bursitis, right hip: Secondary | ICD-10-CM

## 2019-02-17 DIAGNOSIS — M25511 Pain in right shoulder: Secondary | ICD-10-CM

## 2019-02-17 NOTE — Progress Notes (Signed)
Patient [name], female DOB:1945/07/12, 74 y.o. LKT:[id number]  Chief Complaint  Patient presents with  . Arm Pain  . Hip Pain    HPI  [name] is a 74 y.o. female who has pain of the right hip trochanteric area and of the right shoulder.  She fell several weeks ago and hurt her right shoulder.  The right hip is better.  She has pain with elevation of the right shoulder.  She has no redness, numbness.    Her lower back pain is stable today.   Body mass index is 28.12 kg/m.  ROS  Review of Systems  HENT: Negative for congestion.   Respiratory: Positive for shortness of breath. Negative for cough.   Cardiovascular: Negative for chest pain and leg swelling.  Endocrine: Positive for cold intolerance.  Musculoskeletal: Positive for arthralgias, gait problem, joint swelling and myalgias.  Allergic/Immunologic: Positive for environmental allergies.  All other systems reviewed and are negative.   All other systems reviewed and are negative.  The following is a summary of the past history medically, past history surgically, known current medicines, social history and family history.  This information is gathered electronically by the computer from prior information and documentation.  I review this each visit and have found including this information at this point in the chart is beneficial and informative.    Past Medical History:  Diagnosis Date  . Asthma   . Breast cancer ([place]) 2007   Stage I (T1b N0 M0), grade 1 well-differentiated carcinoma of the left breast status, post lumpectomy followed by radiation therapy. Her estrogen receptor receptors were 93%, progesterone receptors 67%. HER-2/neu was negative. No lymphovascular space invasion was seen. All margins were clear. Ki-67 marker was low at 1% with surgery on 11/15/2004. Treated then with post-lumpectomy radiation, finish  . Breast cancer, left breast ([place]) 2007  . COPD (chronic obstructive pulmonary disease)  ([place])   . Diabetes mellitus without complication ([place])   . Diverticula of colon   . Hyperlipidemia   . Hypertension   . Kidney stones   . Nicotine dependence   . Osteoarthritis     Past Surgical History:  Procedure Laterality Date  . ABDOMINAL HYSTERECTOMY    . BREAST SURGERY  2005 approx   left lumpectomy   . CHOLECYSTECTOMY N/A 05/26/2014   Procedure: LAPAROSCOPIC CHOLECYSTECTOMY;  Surgeon: Aviva Signs Md, MD;  Location: [facility] ORS;  Service: General;  Laterality: N/A;  . COLECTOMY     2005, diverticulitis  . COLONOSCOPY N/A 05/20/2017   Procedure: COLONOSCOPY;  Surgeon: Danie Binder, MD;  Location: [facility] ENDO SUITE;  Service: Endoscopy;  Laterality: N/A;  1:45pm  . DILATION AND CURETTAGE OF UTERUS    . ESOPHAGOGASTRODUODENOSCOPY N/A 05/20/2017   Procedure: ESOPHAGOGASTRODUODENOSCOPY (EGD);  Surgeon: Danie Binder, MD;  Location: [facility];  Service: Endoscopy;  Laterality: N/A;  . left breast      cancer, in 2006  . TUBAL LIGATION      Family History  Problem Relation Age of Onset  . Breast cancer Mother   . COPD Father   . Emphysema Father   . Throat cancer Sister   . Glaucoma Brother   . Schizophrenia Brother   . Heart attack Brother   . Lung cancer Sister        former smoker  . Hepatitis C Daughter   . Seizures Daughter   . SIDS Son   . Colon cancer Neg Hx   . Colon polyps  Neg Hx     Social History Social History   Tobacco Use  . Smoking status: Current Every Day Smoker    Packs/day: 1.00    Years: 50.00    Pack years: 50.00    Types: Cigarettes  . Smokeless tobacco: Never Used  . Tobacco comment: cigaretes  now 10 to 15 per day  Substance Use Topics  . Alcohol use: No    Alcohol/week: 0.0 standard drinks  . Drug use: No    Allergies  Allergen Reactions  . Penicillins Hives, Shortness Of Breath and Swelling    Has patient had a PCN reaction causing immediate rash, facial/tongue/throat swelling, SOB or lightheadedness with hypotension: yes Has  patient had a PCN reaction causing severe rash involving mucus membranes or skin necrosis:no Has patient had a PCN reaction that required hospitalization: yes Has patient had a PCN reaction occurring within the last 10 years: no If all of the above answers are "NO", then may proceed with Cephalosporin use.  . Sulfonamide Derivatives Other (See Comments)    Shaking all over, seizure like symptoms. Hospitalization resulted     Current Outpatient Medications  Medication Sig Dispense Refill  . acetaminophen (TYLENOL) 325 MG tablet Take 650 mg by mouth every 6 (six) hours as needed.    Marland Kitchen albuterol (PROVENTIL) (2.5 MG/3ML) 0.083% nebulizer solution INHALE ONE VIAL VIA NEBULIZER THREE TIMES DAILY. 300 mL 0  . amLODipine (NORVASC) 5 MG tablet TAKE 1 TABLET BY MOUTH ONCE A DAY. 90 tablet 0  . aspirin EC 81 MG tablet Take 81 mg by mouth daily.    . ATROVENT HFA 17 MCG/ACT inhaler INHALE 2 PUFFS BY MOUTH FOUR TIMES A DAY. 12.9 g 3  . azelastine (ASTELIN) 0.1 % nasal spray PLACE 2 SPRAYS INTO EACH NOSTRIL TWICE DAILY. 30 mL 5  . benazepril (LOTENSIN) 40 MG tablet TAKE ONE TABLET BY MOUTH DAILY. 90 tablet 0  . benzonatate (TESSALON) 100 MG capsule Take 1 capsule (100 mg total) by mouth 2 (two) times daily as needed for cough. 20 capsule 1  . budesonide-formoterol (SYMBICORT) 160-4.5 MCG/ACT inhaler Inhale 2 puffs into the lungs 2 (two) times daily. 10.2 g 3  . Calcium Carbonate-Vitamin D (CALCIUM 600 + D PO) Take 1 tablet by mouth 2 (two) times daily. 600/400    . cyclobenzaprine (FLEXERIL) 5 MG tablet TAKE ONE TABLET BY MOUTH AT BEDTIME AS NEEDED. 30 tablet 0  . doxycycline (VIBRA-TABS) 100 MG tablet Take 1 tablet (100 mg total) by mouth 2 (two) times daily. 20 tablet 0  . famotidine (PEPCID) 20 MG tablet     . fluticasone (FLONASE) 50 MCG/ACT nasal spray Place 2 sprays into both nostrils daily. 16 g 2  . loratadine (CLARITIN) 10 MG tablet TAKE 1 TABLET BY MOUTH ONCE DAILY FOR ALLERGIES. 30 tablet 4  .  montelukast (SINGULAIR) 10 MG tablet TAKE 1 TABLET BY MOUTH AT BEDTIME. 90 tablet 0  . omeprazole (PRILOSEC) 20 MG capsule Take 20 mg by mouth daily.    . polyethylene glycol powder (GLYCOLAX/MIRALAX) powder MIX 1 CAPFUL IN 8 OUNCES OF JUICE OR WATER AND DRINK ONCE DAILY. 527 g 5  . potassium chloride SA (KLOR-CON) 20 MEQ tablet TAKE 1 TABLET BY MOUTH DAILY. 30 tablet 3  . simvastatin (ZOCOR) 40 MG tablet Take 1 tablet (40 mg total) by mouth at bedtime. 30 tablet 5  . tiZANidine (ZANAFLEX) 4 MG tablet TAKE ONE TABLET AT NIGHT BEFORE BED AS NEEDED FOR MUSCLE SPASMS. 30 tablet  0  . traMADol (ULTRAM) 50 MG tablet TAKE (1) TABLET BY MOUTH EVERY SIX HOURS AS NEEDED. 60 tablet 3   No current facility-administered medications for this visit.     Physical Exam  Height '5\' 5"'  (1.651 m), weight 169 lb (76.7 kg).  Constitutional: overall normal hygiene, normal nutrition, well developed, normal grooming, normal body habitus. Assistive device:none  Musculoskeletal: gait and station Limp none, muscle tone and strength are normal, no tremors or atrophy is present.  .  Neurological: coordination overall normal.  Deep tendon reflex/nerve stretch intact.  Sensation normal.  Cranial nerves II-XII intact.   Skin:   Normal overall no scars, lesions, ulcers or rashes. No psoriasis.  Psychiatric: Alert and oriented x 3.  Recent memory intact, remote memory unclear.  Normal mood and affect. Well groomed.  Good eye contact.  Cardiovascular: overall no swelling, no varicosities, no edema bilaterally, normal temperatures of the legs and arms, no clubbing, cyanosis and good capillary refill.  Lymphatic: palpation is normal.  The right shoulder has decreased painful ROM.  NV intact.  She has no effusion or redness.  NV intact.  ROM of neck is full.  Right hip has full ROM and only slightly tender over the trochanteric area.  Spine/Pelvis examination:  Inspection:  Overall, sacoiliac joint benign and hips  nontender; without crepitus or defects.   Thoracic spine inspection: Alignment normal without kyphosis present   Lumbar spine inspection:  Alignment  with normal lumbar lordosis, without scoliosis apparent.   Thoracic spine palpation:  without tenderness of spinal processes   Lumbar spine palpation: without tenderness of lumbar area; without tightness of lumbar muscles    Range of Motion:   Lumbar flexion, forward flexion is normal without pain or tenderness    Lumbar extension is full without pain or tenderness   Left lateral bend is normal without pain or tenderness   Right lateral bend is normal without pain or tenderness   Straight leg raising is normal  Strength & tone: normal   Stability overall normal stability  All other systems reviewed and are negative   The patient has been educated about the nature of the problem(s) and counseled on treatment options.  The patient appeared to understand what I have discussed and is in agreement with it.  Encounter Diagnoses  Name Primary?  . Chronic right shoulder pain Yes  . Trochanteric bursitis of right hip   . Low back pain radiating to right lower extremity   . Cigarette nicotine dependence without complication    X-rays were done of the right shoulder, reported separately.  PROCEDURE NOTE:  The patient request injection, verbal consent was obtained.  The right shoulder was prepped appropriately after time out was performed.   Sterile technique was observed and injection of 1 cc of Depo-Medrol 40 mg with several cc's of plain xylocaine. Anesthesia was provided by ethyl chloride and a 20-gauge needle was used to inject the shoulder area. A posterior approach was used.  The injection was tolerated well.  A band aid dressing was applied.  The patient was advised to apply ice later today and tomorrow to the injection sight as needed.   PLAN Call if any problems.  Precautions discussed.  Continue current medications.    Return to clinic 6 weeks   Electronically Signed Sanjuana Kava, MD 2/2/20212:14 PM

## 2019-03-02 DIAGNOSIS — J440 Chronic obstructive pulmonary disease with acute lower respiratory infection: Secondary | ICD-10-CM | POA: Diagnosis not present

## 2019-03-02 DIAGNOSIS — J209 Acute bronchitis, unspecified: Secondary | ICD-10-CM | POA: Diagnosis not present

## 2019-03-03 ENCOUNTER — Other Ambulatory Visit: Payer: Self-pay | Admitting: Family Medicine

## 2019-03-04 ENCOUNTER — Other Ambulatory Visit: Payer: Self-pay

## 2019-03-04 LAB — RESPIRATORY CULTURE OR RESPIRATORY AND SPUTUM CULTURE: MICRO NUMBER:: 10152726

## 2019-03-04 NOTE — Patient Outreach (Signed)
[facility]) Care Management  03/04/2019  [name] 1945-06-24 [id number]   Medication Adherence call to [name] Telephone call to Patient regarding Medication Adherence unable to reach patient,[name] ddi not answer patient is past due on Simvastatin 40 mg under [place].   [place] Management Direct Dial [phone]  Fax [phone] [email or web address]

## 2019-03-10 ENCOUNTER — Other Ambulatory Visit: Payer: Self-pay | Admitting: *Deleted

## 2019-03-10 ENCOUNTER — Telehealth: Payer: Self-pay | Admitting: *Deleted

## 2019-03-10 DIAGNOSIS — J209 Acute bronchitis, unspecified: Secondary | ICD-10-CM

## 2019-03-10 DIAGNOSIS — J440 Chronic obstructive pulmonary disease with acute lower respiratory infection: Secondary | ICD-10-CM

## 2019-03-10 NOTE — Progress Notes (Signed)
Pt is aware stated she would go have this done again

## 2019-03-10 NOTE — Telephone Encounter (Signed)
-----   Message from [name], MD sent at 03/09/2019  8:18 AM EST ----- Pls advise specimen cannot be processed, will need to resubmit if still symptomatic

## 2019-03-10 NOTE — Telephone Encounter (Signed)
Pt is aware and will go back to get this done sputum culture reordered

## 2019-03-16 ENCOUNTER — Other Ambulatory Visit: Payer: Self-pay | Admitting: Orthopaedic Surgery

## 2019-03-16 DIAGNOSIS — H25813 Combined forms of age-related cataract, bilateral: Secondary | ICD-10-CM | POA: Diagnosis not present

## 2019-03-17 ENCOUNTER — Other Ambulatory Visit: Payer: Self-pay

## 2019-03-17 NOTE — Patient Outreach (Signed)
[facility]) Care Management  03/17/2019  [name] 1945-03-26 [id number]   Medication Adherence call to [name] [facility] Identifiers Verify spoke with patient she is past due on Simvastatin patient explain she is taking 1 tablet daily,patient said she has about a month left,she explain at some point she received a duplicate order reason why she still has some. [name] is showing past due under [place].  [place] Management Direct Dial [phone]  Fax [phone] [email or web address]

## 2019-03-23 ENCOUNTER — Other Ambulatory Visit: Payer: Self-pay | Admitting: Family Medicine

## 2019-03-23 ENCOUNTER — Ambulatory Visit: Payer: Medicare Other | Attending: Internal Medicine

## 2019-03-23 DIAGNOSIS — Z23 Encounter for immunization: Secondary | ICD-10-CM

## 2019-03-23 NOTE — Progress Notes (Signed)
   Covid-19 Vaccination Clinic  Name:  [name]    MRN: [id number] DOB: Jun 15, 1945  03/23/2019  [name] was observed post Covid-19 immunization for 30 minutes based on pre-vaccination screening without incident. She was provided with Vaccine Information Sheet and instruction to access the V-Safe system.   [name] was instructed to call 911 with any severe reactions post vaccine: Marland Kitchen Difficulty breathing  . Swelling of face and throat  . A fast heartbeat  . A bad rash all over body  . Dizziness and weakness   Immunizations Administered    Name Date Dose VIS Date Route   Pfizer COVID-19 Vaccine 03/23/2019  1:03 PM 0.3 mL 12/26/2018 Intramuscular   Manufacturer: [place]   Lot: [id number]   [place]: [id number]

## 2019-03-30 ENCOUNTER — Other Ambulatory Visit: Payer: Self-pay | Admitting: Family Medicine

## 2019-03-31 ENCOUNTER — Encounter: Payer: Self-pay | Admitting: Orthopaedic Surgery

## 2019-03-31 ENCOUNTER — Ambulatory Visit (INDEPENDENT_AMBULATORY_CARE_PROVIDER_SITE_OTHER): Payer: Medicare Other | Admitting: Orthopaedic Surgery

## 2019-03-31 ENCOUNTER — Other Ambulatory Visit: Payer: Self-pay

## 2019-03-31 VITALS — BP 147/87 | HR 84 | Temp 98.3°F | Ht 65.0 in | Wt 179.0 lb

## 2019-03-31 DIAGNOSIS — F17210 Nicotine dependence, cigarettes, uncomplicated: Secondary | ICD-10-CM

## 2019-03-31 DIAGNOSIS — G8929 Other chronic pain: Secondary | ICD-10-CM | POA: Diagnosis not present

## 2019-03-31 DIAGNOSIS — M25561 Pain in right knee: Secondary | ICD-10-CM | POA: Diagnosis not present

## 2019-03-31 NOTE — Progress Notes (Signed)
Patient XN:[id number] [name], female DOB:10/23/1945, 74 y.o. [id number]  Chief Complaint  Patient presents with  . Shoulder Pain    Right shoulder and knee  . Knee Pain    HPI  [name] is a 74 y.o. female who has chronic pain of the right shoulder and right hip.  They are better.  She has more pain of the right knee today, more medially.  She has no trauma, no redness, no giving way. She has some swelling and popping.     Body mass index is 29.79 kg/m.  ROS  Review of Systems  HENT: Negative for congestion.   Respiratory: Positive for shortness of breath. Negative for cough.   Cardiovascular: Negative for chest pain and leg swelling.  Endocrine: Positive for cold intolerance.  Musculoskeletal: Positive for arthralgias, gait problem, joint swelling and myalgias.  Allergic/Immunologic: Positive for environmental allergies.  All other systems reviewed and are negative.   All other systems reviewed and are negative.  The following is a summary of the past history medically, past history surgically, known current medicines, social history and family history.  This information is gathered electronically by the computer from prior information and documentation.  I review this each visit and have found including this information at this point in the chart is beneficial and informative.    Past Medical History:  Diagnosis Date  . Asthma   . Breast cancer ([place]) 2007   Stage I (T1b N0 M0), grade 1 well-differentiated carcinoma of the left breast status, post lumpectomy followed by radiation therapy. Her estrogen receptor receptors were 93%, progesterone receptors 67%. HER-2/neu was negative. No lymphovascular space invasion was seen. All margins were clear. Ki-67 marker was low at 1% with surgery on 11/15/2004. Treated then with post-lumpectomy radiation, finish  . Breast cancer, left breast ([place]) 2007  . COPD (chronic obstructive pulmonary disease) ([place])   . Diabetes mellitus  without complication ([place])   . Diverticula of colon   . Hyperlipidemia   . Hypertension   . Kidney stones   . Nicotine dependence   . Osteoarthritis     Past Surgical History:  Procedure Laterality Date  . ABDOMINAL HYSTERECTOMY    . BREAST SURGERY  2005 approx   left lumpectomy   . CHOLECYSTECTOMY N/A 05/26/2014   Procedure: LAPAROSCOPIC CHOLECYSTECTOMY;  Surgeon: Aviva Signs Md, MD;  Location: [facility];  Service: General;  Laterality: N/A;  . COLECTOMY     2005, diverticulitis  . COLONOSCOPY N/A 05/20/2017   Procedure: COLONOSCOPY;  Surgeon: Danie Binder, MD;  Location: [facility];  Service: Endoscopy;  Laterality: N/A;  1:45pm  . DILATION AND CURETTAGE OF UTERUS    . ESOPHAGOGASTRODUODENOSCOPY N/A 05/20/2017   Procedure: ESOPHAGOGASTRODUODENOSCOPY (EGD);  Surgeon: Danie Binder, MD;  Location: [facility];  Service: Endoscopy;  Laterality: N/A;  . left breast      cancer, in 2006  . TUBAL LIGATION      Family History  Problem Relation Age of Onset  . Breast cancer Mother   . COPD Father   . Emphysema Father   . Throat cancer Sister   . Glaucoma Brother   . Schizophrenia Brother   . Heart attack Brother   . Lung cancer Sister        former smoker  . Hepatitis C Daughter   . Seizures Daughter   . SIDS Son   . Colon cancer Neg Hx   . Colon polyps Neg Hx  Social History Social History   Tobacco Use  . Smoking status: Current Every Day Smoker    Packs/day: 1.00    Years: 50.00    Pack years: 50.00    Types: Cigarettes  . Smokeless tobacco: Never Used  . Tobacco comment: cigaretes  now 10 to 15 per day  Substance Use Topics  . Alcohol use: No    Alcohol/week: 0.0 standard drinks  . Drug use: No    Allergies  Allergen Reactions  . Penicillins Hives, Shortness Of Breath and Swelling    Has patient had a PCN reaction causing immediate rash, facial/tongue/throat swelling, SOB or lightheadedness with hypotension: yes Has patient had a PCN reaction  causing severe rash involving mucus membranes or skin necrosis:no Has patient had a PCN reaction that required hospitalization: yes Has patient had a PCN reaction occurring within the last 10 years: no If all of the above answers are "NO", then may proceed with Cephalosporin use.  . Sulfonamide Derivatives Other (See Comments)    Shaking all over, seizure like symptoms. Hospitalization resulted     Current Outpatient Medications  Medication Sig Dispense Refill  . traMADol (ULTRAM) 50 MG tablet TAKE (1) TABLET BY MOUTH EVERY SIX HOURS AS NEEDED. 60 tablet 2  . acetaminophen (TYLENOL) 325 MG tablet Take 650 mg by mouth every 6 (six) hours as needed.    Marland Kitchen albuterol (PROVENTIL) (2.5 MG/3ML) 0.083% nebulizer solution INHALE ONE VIAL VIA NEBULIZER THREE TIMES DAILY. 300 mL 0  . amLODipine (NORVASC) 5 MG tablet TAKE 1 TABLET BY MOUTH ONCE A DAY. 90 tablet 0  . aspirin EC 81 MG tablet Take 81 mg by mouth daily.    . ATROVENT HFA 17 MCG/ACT inhaler INHALE 2 PUFFS BY MOUTH FOUR TIMES A DAY. 12.9 g 3  . azelastine (ASTELIN) 0.1 % nasal spray PLACE 2 SPRAYS INTO EACH NOSTRIL TWICE DAILY. 30 mL 5  . benazepril (LOTENSIN) 40 MG tablet TAKE ONE TABLET BY MOUTH DAILY. 90 tablet 0  . benzonatate (TESSALON) 100 MG capsule Take 1 capsule (100 mg total) by mouth 2 (two) times daily as needed for cough. 20 capsule 1  . budesonide-formoterol (SYMBICORT) 160-4.5 MCG/ACT inhaler Inhale 2 puffs into the lungs 2 (two) times daily. 10.2 g 3  . Calcium Carbonate-Vitamin D (CALCIUM 600 + D PO) Take 1 tablet by mouth 2 (two) times daily. 600/400    . cyclobenzaprine (FLEXERIL) 5 MG tablet TAKE ONE TABLET BY MOUTH AT BEDTIME AS NEEDED. 30 tablet 0  . doxycycline (VIBRA-TABS) 100 MG tablet Take 1 tablet (100 mg total) by mouth 2 (two) times daily. 20 tablet 0  . famotidine (PEPCID) 20 MG tablet     . fluticasone (FLONASE) 50 MCG/ACT nasal spray Place 2 sprays into both nostrils daily. 16 g 2  . loratadine (CLARITIN) 10 MG  tablet TAKE 1 TABLET BY MOUTH ONCE DAILY FOR ALLERGIES. 30 tablet 4  . montelukast (SINGULAIR) 10 MG tablet TAKE 1 TABLET BY MOUTH AT BEDTIME. 90 tablet 0  . omeprazole (PRILOSEC) 20 MG capsule Take 20 mg by mouth daily.    . polyethylene glycol powder (GLYCOLAX/MIRALAX) powder MIX 1 CAPFUL IN 8 OUNCES OF JUICE OR WATER AND DRINK ONCE DAILY. 527 g 5  . potassium chloride SA (KLOR-CON) 20 MEQ tablet TAKE 1 TABLET BY MOUTH DAILY. 30 tablet 0  . simvastatin (ZOCOR) 40 MG tablet Take 1 tablet (40 mg total) by mouth at bedtime. 30 tablet 5  . tiZANidine (ZANAFLEX) 4 MG tablet  TAKE ONE TABLET AT NIGHT BEFORE BED AS NEEDED FOR MUSCLE SPASMS. 30 tablet 0   No current facility-administered medications for this visit.     Physical Exam  Blood pressure (!) 147/87, pulse 84, temperature 98.3 F (36.8 C), height _0  (1.651 m), weight 179 lb (81.2 kg).  Constitutional: overall normal hygiene, normal nutrition, well developed, normal grooming, normal body habitus. Assistive device:none  Musculoskeletal: gait and station Limp right, muscle tone and strength are normal, no tremors or atrophy is present.  .  Neurological: coordination overall normal.  Deep tendon reflex/nerve stretch intact.  Sensation normal.  Cranial nerves II-XII intact.   Skin:   Normal overall no scars, lesions, ulcers or rashes. No psoriasis.  Psychiatric: Alert and oriented x 3.  Recent memory intact, remote memory unclear.  Normal mood and affect. Well groomed.  Good eye contact.  Cardiovascular: overall no swelling, no varicosities, no edema bilaterally, normal temperatures of the legs and arms, no clubbing, cyanosis and good capillary refill.  Lymphatic: palpation is normal.  Right knee has slight effusion, crepitus, ROM 0 to 110, slight limp right, NV intact.  Stable.  All other systems reviewed and are negative   The patient has been educated about the nature of the problem(s) and counseled on treatment options.  The  patient appeared to understand what I have discussed and is in agreement with it.  Encounter Diagnoses  Name Primary?  . Cigarette nicotine dependence without complication   . Chronic pain of right knee Yes    PLAN Call if any problems.  Precautions discussed.  Continue current medications.   Return to clinic 6 weeks   Electronically Signed Sanjuana Kava, MD 3/16/20212:04 PM

## 2019-04-09 ENCOUNTER — Telehealth (INDEPENDENT_AMBULATORY_CARE_PROVIDER_SITE_OTHER): Payer: Medicare Other | Admitting: Family Medicine

## 2019-04-09 ENCOUNTER — Other Ambulatory Visit: Payer: Self-pay

## 2019-04-09 ENCOUNTER — Encounter: Payer: Self-pay | Admitting: Family Medicine

## 2019-04-09 VITALS — Ht 64.0 in | Wt 170.0 lb

## 2019-04-09 DIAGNOSIS — J449 Chronic obstructive pulmonary disease, unspecified: Secondary | ICD-10-CM

## 2019-04-09 MED ORDER — AZITHROMYCIN 250 MG PO TABS: ORAL_TABLET | ORAL | 0 refills | Status: DC

## 2019-04-09 MED ORDER — DM-GUAIFENESIN ER 30-600 MG PO TB12: 1.0000 | ORAL_TABLET | Freq: Two times a day (BID) | ORAL | 0 refills | Status: DC

## 2019-04-09 NOTE — Patient Instructions (Addendum)
     If you have lab work done today you will be contacted with your lab results within the next 2 weeks.  If you have not heard from [place] then please contact us. The fastest way to get your results is to register for My Chart.   IF you received an x-ray today, you will receive an invoice from [facility]. Please contact [facility] at [phone] with questions or concerns regarding your invoice.   IF you received labwork today, you will receive an invoice from [facility]. Please contact [facility] at [phone] with questions or concerns regarding your invoice.   Urgent care evaluation if no improvement Medications: Mucinex and Zithromax STOP SMOKING-USE THE [facility]  Our billing staff will not be able to assist you with questions regarding bills from these companies.  You will be contacted with the lab results as soon as they are available. The fastest way to get your results is to activate your My Chart account. Instructions are located on the last page of this paperwork. If you have not heard from [place] regarding the results in 2 weeks, please contact this office.

## 2019-04-09 NOTE — Progress Notes (Signed)
Virtual Visit via Telephone Note  I connected with [name] on 04/09/19 at 10:40 AM EDT by telephone and verified that I am speaking with the correct person using two identifiers. DOB/address  Location: Patient: home Provider: clinic   I discussed the limitations, risks, security and privacy concerns of performing an evaluation and management service by telephone and the availability of in person appointments. I also discussed with the patient that there may be a patient responsible charge related to this service. The patient expressed understanding and agreed to proceed.   History of Present Illness: Pt with chronic bronchitis-3 documented episodes in the last 9 months. Pt continued to smoke 1/2 pk/day. Pt states she uses patches in between smoking. Pt states she has no fever, chills, nausea, vomiting or diarrhea. Pt can sleep in bed but coughs- productive. Pt reports using albuterol nebs BID(causes shakes) atorvent QID, symbicort BID, singulair, flonase, astelin and tylenol in additional to medication for blood pressure. Pt reports she has not tried mucinex-used tessalon perles ROS-watery eyes, watery nasal discharge, ear pressure, , no sore throat, facial pressure, no wheezing, productive cough Observations/Objective: Unable to provide bp-machine reported error, pt does not have a sat monitor  Assessment and Plan: 1. COPD (chronic obstructive pulmonary disease) with chronic bronchitis ([place]) zpack-Pen allergic-took doxy in Jan, mucinex DM 12 hour, continue other medications  Follow Up Instructions: UC if no improvement   I discussed the assessment and treatment plan with the patient. The patient was provided an opportunity to ask questions and all were answered. The patient agreed with the plan and demonstrated an understanding of the instructions.   The patient was advised to call back or seek an in-person evaluation if the symptoms worsen or if the condition fails to improve as  anticipated.  I provided 20 minutes of non-face-to-face time during this encounter.   Despina Boan Hannah Beat, MD

## 2019-04-15 ENCOUNTER — Other Ambulatory Visit: Payer: Self-pay | Admitting: Family Medicine

## 2019-04-21 NOTE — H&P (Signed)
Surgical History & Physical  Patient Name: [name] DOB: 1945/02/09  Surgery: Cataract extraction with intraocular lens implant phacoemulsification; Left Eye  Surgeon: Baruch Goldmann MD Surgery Date:  05/01/2019 Pre-Op Date:  04/21/2019  HPI: A 29 Yr. old female patient 1. The patient is here for a cataract evaluation. She complains of blurry vision at a distance and at near. She also complains of trouble with glare from car headlights. Both eyes are affected. The episode is constant. The condition's severity is worsening. The condition is worse with daily activities. She was scheduled for cataract surgery at the end of 2019, but had to cancel due to the pandemic. HPI was performed by Baruch Goldmann .  Medical History: Cataracts OU Arthritis Cancer Hardening of arteries Polyps in stomach HX of di... High Blood Pressure LDL  Review of Systems Cardiovascular High Blood Pressure Respiratory COPD All recorded systems are negative except as noted above.  Social   Current every day smoker  Medication Acetaminophinen, Proair respiClick, Amlodipine Besylate, Azelastine, Calcium Carbonate, Fluticasone, Atrovent, Loratadine, Montelukast, Omega-3, Potassium chloride, Simvastatin, Symbicort Inhalant Product, Zanaflex, Aspirin, Benazepril, Hydrocodone, Ibuprofen,   Sx/Procedures Diverticulitis Sx,   Drug Allergies  Penicillins, Sulfa,   History & Physical: Heent:  Cataract, Left eye NECK: supple without bruits LUNGS: lungs clear to auscultation CV: regular rate and rhythm Abdomen: soft and non-tender  Impression & Plan: Assessment: 1.  COMBINED FORMS AGE RELATED CATARACT; Both Eyes (H25.813) 2.  ASTIGMATISM, REGULAR; Both Eyes (H52.223)  Plan: 1.  Cataract accounts for the patient's decreased vision. This visual impairment is not correctable with a tolerable change in glasses or contact lenses. Cataract surgery with an implantation of a new lens should significantly improve the  visual and functional status of the patient. Discussed all risks, benefits, alternatives, and potential complications. Discussed the procedures and recovery. Patient desires to have surgery. A-scan ordered and performed today for intra-ocular lens calculations. The surgery will be performed in order to improve vision for driving, reading, and for eye examinations. Recommend phacoemulsification with intra-ocular lens. Left Eye worse - first. Dilates well - shugarcaine by protocol. 2.  Declines toric lens.

## 2019-04-22 ENCOUNTER — Ambulatory Visit: Payer: Medicare Other | Attending: Internal Medicine

## 2019-04-22 DIAGNOSIS — Z23 Encounter for immunization: Secondary | ICD-10-CM

## 2019-04-22 NOTE — Progress Notes (Signed)
   Covid-19 Vaccination Clinic  Name:  NITRA DSOUZA    MRN: [id number] DOB: 1945-05-05  04/22/2019  Ms. Pierro was observed post Covid-19 immunization for 15 minutes without incident. She was provided with Vaccine Information Sheet and instruction to access the V-Safe system.   Ms. Dunsworth was instructed to call 911 with any severe reactions post vaccine: Marland Kitchen Difficulty breathing  . Swelling of face and throat  . A fast heartbeat  . A bad rash all over body  . Dizziness and weakness   Immunizations Administered    Name Date Dose VIS Date Route   Pfizer COVID-19 Vaccine 04/22/2019 12:53 PM 0.3 mL 12/26/2018 Intramuscular   Manufacturer: [place]   Lot: [id number]   [place]: [id number]

## 2019-04-23 ENCOUNTER — Other Ambulatory Visit: Payer: Self-pay | Admitting: Family Medicine

## 2019-04-27 DIAGNOSIS — H25812 Combined forms of age-related cataract, left eye: Secondary | ICD-10-CM | POA: Diagnosis not present

## 2019-04-27 NOTE — Patient Instructions (Signed)
[name]  04/27/2019     @PREFPERIOPPHARMACY @   Your procedure is scheduled on  05/01/2019 .  Report to [facility] at  [place].M.  Call this number if you have problems the morning of surgery:  [phone]   Remember:  Do not eat or drink after midnight.                       Take these medicines the morning of surgery with A SIP OF WATER  Amlodipine, benazepril, flexeril(if needed), claritin, tramadol(if needed).    Do not wear jewelry, make-up or nail polish.  Do not wear lotions, powders, or perfumes. Please wear deodorant and brush your teeth.  Do not shave 48 hours prior to surgery.  Men may shave face and neck.  Do not bring valuables to the hospital.  [facility] is not responsible for any belongings or valuables.  Contacts, dentures or bridgework may not be worn into surgery.  Leave your suitcase in the car.  After surgery it may be brought to your room.  For patients admitted to the hospital, discharge time will be determined by your treatment team.  Patients discharged the day of surgery will not be allowed to drive home.   Name and phone number of your driver:   family Special instructions:  DO NOT smoke the morning of your procedure.  Please read over the following fact sheets that you were given. Anesthesia Post-op Instructions and Care and Recovery After Surgery       Cataract Surgery, Care After This sheet gives you information about how to care for yourself after your procedure. Your health care provider may also give you more specific instructions. If you have problems or questions, contact your health care provider. What can I expect after the procedure? After the procedure, it is common to have:  Itching.  Discomfort.  Fluid discharge.  Sensitivity to light and to touch.  Bruising in or around the eye.  Mild blurred vision. Follow these instructions at home: Eye care   Do not touch or rub your eyes.  Protect your eyes as  told by your health care provider. You may be told to wear a protective eye shield or sunglasses.  Do not put a contact lens into the affected eye or eyes until your health care provider approves.  Keep the area around your eye clean and dry: ? Avoid swimming. ? Do not allow water to hit you directly in the face while showering. ? Keep soap and shampoo out of your eyes.  Check your eye every day for signs of infection. Watch for: ? Redness, swelling, or pain. ? Fluid, blood, or pus. ? Warmth. ? A bad smell. ? Vision that is getting worse. ? Sensitivity that is getting worse. Activity  Do not drive for 24 hours if you were given a sedative during your procedure.  Avoid strenuous activities, such as playing contact sports, for as long as told by your health care provider.  Do not drive or use heavy machinery until your health care provider approves.  Do not bend or lift heavy objects. Bending increases pressure in the eye. You can walk, climb stairs, and do light household chores.  Ask your health care provider when you can return to work. If you work in a dusty environment, you may be advised to wear protective eyewear for a period of time. General instructions  Take or apply over-the-counter and  prescription medicines only as told by your health care provider. This includes eye drops.  Keep all follow-up visits as told by your health care provider. This is important. Contact a health care provider if:  You have increased bruising around your eye.  You have pain that is not helped with medicine.  You have a fever.  You have redness, swelling, or pain in your eye.  You have fluid, blood, or pus coming from your incision.  Your vision gets worse.  Your sensitivity to light gets worse. Get help right away if:  You have sudden loss of vision.  You see flashes of light or spots (floaters).  You have severe eye pain.  You develop nausea or vomiting. Summary  After  your procedure, it is common to have itching, discomfort, bruising, fluid discharge, or sensitivity to light.  Follow instructions from your health care provider about caring for your eye after the procedure.  Do not rub your eye after the procedure. You may need to wear eye protection or sunglasses. Do not wear contact lenses. Keep the area around your eye clean and dry.  Avoid activities that require a lot of effort. These include playing sports and lifting heavy objects.  Contact a health care provider if you have increased bruising, pain that does not go away, or a fever. Get help right away if you suddenly lose your vision, see flashes of light or spots, or have severe pain in the eye. This information is not intended to replace advice given to you by your health care provider. Make sure you discuss any questions you have with your health care provider. Document Revised: 10/28/2018 Document Reviewed: 07/01/2017 Elsevier Patient Education  2020 [place] After These instructions provide you with information about caring for yourself after your procedure. Your health care provider may also give you more specific instructions. Your treatment has been planned according to current medical practices, but problems sometimes occur. Call your health care provider if you have any problems or questions after your procedure. What can I expect after the procedure? After your procedure, you may:  Feel sleepy for several hours.  Feel clumsy and have poor balance for several hours.  Feel forgetful about what happened after the procedure.  Have poor judgment for several hours.  Feel nauseous or vomit.  Have a sore throat if you had a breathing tube during the procedure. Follow these instructions at home: For at least 24 hours after the procedure:      Have a responsible adult stay with you. It is important to have someone help care for you until you are  awake and alert.  Rest as needed.  Do not: ? Participate in activities in which you could fall or become injured. ? Drive. ? Use heavy machinery. ? Drink alcohol. ? Take sleeping pills or medicines that cause drowsiness. ? Make important decisions or sign legal documents. ? Take care of children on your own. Eating and drinking  Follow the diet that is recommended by your health care provider.  If you vomit, drink water, juice, or soup when you can drink without vomiting.  Make sure you have little or no nausea before eating solid foods. General instructions  Take over-the-counter and prescription medicines only as told by your health care provider.  If you have sleep apnea, surgery and certain medicines can increase your risk for breathing problems. Follow instructions from your health care provider about wearing your sleep device: ? Anytime  you are sleeping, including during daytime naps. ? While taking prescription pain medicines, sleeping medicines, or medicines that make you drowsy.  If you smoke, do not smoke without supervision.  Keep all follow-up visits as told by your health care provider. This is important. Contact a health care provider if:  You keep feeling nauseous or you keep vomiting.  You feel light-headed.  You develop a rash.  You have a fever. Get help right away if:  You have trouble breathing. Summary  For several hours after your procedure, you may feel sleepy and have poor judgment.  Have a responsible adult stay with you for at least 24 hours or until you are awake and alert. This information is not intended to replace advice given to you by your health care provider. Make sure you discuss any questions you have with your health care provider. Document Revised: 04/01/2017 Document Reviewed: 04/24/2015 Elsevier Patient Education  [place].

## 2019-04-28 ENCOUNTER — Other Ambulatory Visit (HOSPITAL_COMMUNITY)
Admission: RE | Admit: 2019-04-28 | Discharge: 2019-04-28 | Disposition: A | Discharge status: Home / Self Care | Payer: Medicare Other | Source: Ambulatory Visit | Attending: Ophthalmology | Admitting: Ophthalmology

## 2019-04-28 ENCOUNTER — Other Ambulatory Visit: Payer: Self-pay

## 2019-04-28 ENCOUNTER — Encounter (HOSPITAL_COMMUNITY): Payer: Self-pay

## 2019-04-28 ENCOUNTER — Encounter (HOSPITAL_COMMUNITY)
Admission: RE | Admit: 2019-04-28 | Discharge: 2019-04-28 | Disposition: A | Discharge status: Home / Self Care | Payer: Medicare Other | Source: Ambulatory Visit | Attending: Ophthalmology | Admitting: Ophthalmology

## 2019-04-28 DIAGNOSIS — Z01812 Encounter for preprocedural laboratory examination: Secondary | ICD-10-CM | POA: Diagnosis not present

## 2019-04-28 DIAGNOSIS — Z20822 Contact with and (suspected) exposure to covid-19: Secondary | ICD-10-CM | POA: Diagnosis not present

## 2019-04-28 LAB — BASIC METABOLIC PANEL
Anion gap: 11 (ref 5–15)
BUN: 16 mg/dL (ref 8–23)
CO2: 25 mmol/L (ref 22–32)
Calcium: 9.4 mg/dL (ref 8.9–10.3)
Chloride: 105 mmol/L (ref 98–111)
Creatinine, Ser: 0.9 mg/dL (ref 0.44–1.00)
GFR calc Af Amer: >60 mL/min (ref >60)
GFR calc non Af Amer: >60 mL/min (ref >60)
Glucose, Bld: 148 mg/dL — ABNORMAL HIGH (ref 70–99)
Potassium: 4.4 mmol/L (ref 3.5–5.1)
Sodium: 141 mmol/L (ref 135–145)

## 2019-04-28 LAB — GLUCOSE, CAPILLARY: Glucose-Capillary: 143 mg/dL — ABNORMAL HIGH (ref 70–99)

## 2019-04-29 ENCOUNTER — Other Ambulatory Visit: Payer: Self-pay | Admitting: Orthopaedic Surgery

## 2019-04-29 LAB — HEMOGLOBIN A1C
Hgb A1c MFr Bld: 5.9 % — ABNORMAL HIGH (ref 4.8–5.6)
Mean Plasma Glucose: 123 mg/dL

## 2019-04-29 LAB — SARS CORONAVIRUS 2 (TAT 6-24 HRS): SARS Coronavirus 2: NEGATIVE

## 2019-04-29 NOTE — Pre-Procedure Instructions (Signed)
HgbA1C routed to PCP. 

## 2019-05-01 ENCOUNTER — Encounter (HOSPITAL_COMMUNITY): Payer: Self-pay | Admitting: Ophthalmology

## 2019-05-01 ENCOUNTER — Encounter (HOSPITAL_COMMUNITY)
Admission: RE | Disposition: A | Discharge status: Home / Self Care | Payer: Self-pay | Source: Home / Self Care | Attending: Ophthalmology

## 2019-05-01 ENCOUNTER — Ambulatory Visit (HOSPITAL_COMMUNITY): Payer: Medicare Other | Admitting: Anesthesiology

## 2019-05-01 ENCOUNTER — Ambulatory Visit (HOSPITAL_COMMUNITY)
Admission: RE | Admit: 2019-05-01 | Discharge: 2019-05-01 | Disposition: A | Discharge status: Home / Self Care | Payer: Medicare Other | Attending: Ophthalmology | Admitting: Ophthalmology

## 2019-05-01 DIAGNOSIS — Z7951 Long term (current) use of inhaled steroids: Secondary | ICD-10-CM | POA: Insufficient documentation

## 2019-05-01 DIAGNOSIS — H52223 Regular astigmatism, bilateral: Secondary | ICD-10-CM | POA: Diagnosis not present

## 2019-05-01 DIAGNOSIS — Z7982 Long term (current) use of aspirin: Secondary | ICD-10-CM | POA: Diagnosis not present

## 2019-05-01 DIAGNOSIS — Z79899 Other long term (current) drug therapy: Secondary | ICD-10-CM | POA: Insufficient documentation

## 2019-05-01 DIAGNOSIS — M1990 Unspecified osteoarthritis, unspecified site: Secondary | ICD-10-CM | POA: Insufficient documentation

## 2019-05-01 DIAGNOSIS — F17200 Nicotine dependence, unspecified, uncomplicated: Secondary | ICD-10-CM | POA: Insufficient documentation

## 2019-05-01 DIAGNOSIS — E1136 Type 2 diabetes mellitus with diabetic cataract: Secondary | ICD-10-CM | POA: Diagnosis not present

## 2019-05-01 DIAGNOSIS — J449 Chronic obstructive pulmonary disease, unspecified: Secondary | ICD-10-CM | POA: Insufficient documentation

## 2019-05-01 DIAGNOSIS — Z791 Long term (current) use of non-steroidal anti-inflammatories (NSAID): Secondary | ICD-10-CM | POA: Diagnosis not present

## 2019-05-01 DIAGNOSIS — I2510 Atherosclerotic heart disease of native coronary artery without angina pectoris: Secondary | ICD-10-CM | POA: Insufficient documentation

## 2019-05-01 DIAGNOSIS — Z880 Allergy status to penicillin: Secondary | ICD-10-CM | POA: Insufficient documentation

## 2019-05-01 DIAGNOSIS — I10 Essential (primary) hypertension: Secondary | ICD-10-CM | POA: Insufficient documentation

## 2019-05-01 DIAGNOSIS — Z882 Allergy status to sulfonamides status: Secondary | ICD-10-CM | POA: Insufficient documentation

## 2019-05-01 DIAGNOSIS — H25812 Combined forms of age-related cataract, left eye: Secondary | ICD-10-CM | POA: Diagnosis not present

## 2019-05-01 DIAGNOSIS — Z853 Personal history of malignant neoplasm of breast: Secondary | ICD-10-CM | POA: Insufficient documentation

## 2019-05-01 HISTORY — PX: CATARACT EXTRACTION W/PHACO: SHX586

## 2019-05-01 LAB — GLUCOSE, CAPILLARY: Glucose-Capillary: 104 mg/dL — ABNORMAL HIGH (ref 70–99)

## 2019-05-01 SURGERY — PHACOEMULSIFICATION, CATARACT, WITH IOL INSERTION
Anesthesia: Monitor Anesthesia Care | Site: Eye | Laterality: Left

## 2019-05-01 MED ORDER — MIDAZOLAM HCL 2 MG/2ML IJ SOLN
INTRAMUSCULAR | Status: DC | PRN
  Administered 2019-05-01: 1 mg via INTRAVENOUS

## 2019-05-01 MED ORDER — MIDAZOLAM HCL 2 MG/2ML IJ SOLN
INTRAMUSCULAR | Status: AC
  Filled 2019-05-01: qty 2

## 2019-05-01 MED ORDER — LIDOCAINE HCL (PF) 1 % IJ SOLN
INTRAOCULAR | Status: DC | PRN
  Administered 2019-05-01: 1 mL via OPHTHALMIC

## 2019-05-01 MED ORDER — SODIUM HYALURONATE 23 MG/ML IO SOLN
INTRAOCULAR | Status: DC | PRN
  Administered 2019-05-01: 0.6 mL via INTRAOCULAR

## 2019-05-01 MED ORDER — PROVISC 10 MG/ML IO SOLN
INTRAOCULAR | Status: DC | PRN
  Administered 2019-05-01: 0.85 mL via INTRAOCULAR

## 2019-05-01 MED ORDER — POVIDONE-IODINE 5 % OP SOLN
OPHTHALMIC | Status: DC | PRN
  Administered 2019-05-01: 1 via OPHTHALMIC

## 2019-05-01 MED ORDER — EPINEPHRINE PF 1 MG/ML IJ SOLN
INTRAOCULAR | Status: DC | PRN
  Administered 2019-05-01: 500 mL

## 2019-05-01 MED ORDER — LIDOCAINE HCL 3.5 % OP GEL
1.0000 "application " | Freq: Once | OPHTHALMIC | Status: AC
  Administered 2019-05-01: 1 via OPHTHALMIC

## 2019-05-01 MED ORDER — PHENYLEPHRINE HCL 2.5 % OP SOLN
1.0000 [drp] | OPHTHALMIC | Status: AC | PRN
  Administered 2019-05-01 (×3): 1 [drp] via OPHTHALMIC

## 2019-05-01 MED ORDER — CYCLOPENTOLATE-PHENYLEPHRINE 0.2-1 % OP SOLN
1.0000 [drp] | OPHTHALMIC | Status: AC | PRN
  Administered 2019-05-01 (×3): 1 [drp] via OPHTHALMIC

## 2019-05-01 MED ORDER — BSS IO SOLN
INTRAOCULAR | Status: DC | PRN
  Administered 2019-05-01: 15 mL

## 2019-05-01 MED ORDER — TETRACAINE HCL 0.5 % OP SOLN
1.0000 [drp] | OPHTHALMIC | Status: AC | PRN
  Administered 2019-05-01 (×3): 1 [drp] via OPHTHALMIC

## 2019-05-01 MED ORDER — EPINEPHRINE PF 1 MG/ML IJ SOLN
INTRAMUSCULAR | Status: AC
  Filled 2019-05-01: qty 2

## 2019-05-01 SURGICAL SUPPLY — 15 items

## 2019-05-01 NOTE — Anesthesia Preprocedure Evaluation (Addendum)
Anesthesia Evaluation  Patient identified by MRN, date of birth, ID band Patient awake    Reviewed: Allergy & Precautions, NPO status , Patient's Chart, lab work & pertinent test results  Airway Mallampati: III  TM Distance: >3 FB Neck ROM: Full    Dental  (+) Edentulous Upper, Partial Lower   Pulmonary shortness of breath and with exertion, asthma , COPD,  COPD inhaler, Current Smoker and Patient abstained from smoking.,    Pulmonary exam normal breath sounds clear to auscultation       Cardiovascular Exercise Tolerance: Good hypertension, Pt. on medications + CAD  Normal cardiovascular exam Rhythm:Regular Rate:Normal  15-Sep-2018 15:02:39 [facility] ROUTINE RECORD Sinus rhythm Prolonged PR interval Probable anteroseptal infarct, old Baseline wander in lead(s) V4   Neuro/Psych Tremors   Neuromuscular disease (tremors ) negative psych ROS   GI/Hepatic negative GI ROS, Neg liver ROS,   Endo/Other  diabetes (not on meds), Well Controlled, Type 2  Renal/GU Renal disease (stones)     Musculoskeletal  (+) Arthritis , Osteoarthritis,    Abdominal   Peds  Hematology   Anesthesia Other Findings Left breast cancer  Reproductive/Obstetrics                            Anesthesia Physical Anesthesia Plan  ASA: III  Anesthesia Plan: MAC   Post-op Pain Management:    Induction:   PONV Risk Score and Plan:   Airway Management Planned: Nasal Cannula and Natural Airway  Additional Equipment:   Intra-op Plan:   Post-operative Plan:   Informed Consent: I have reviewed the patients History and Physical, chart, labs and discussed the procedure including the risks, benefits and alternatives for the proposed anesthesia with the patient or authorized representative who has indicated his/her understanding and acceptance.     Dental advisory given  Plan Discussed with: CRNA  and Surgeon  Anesthesia Plan Comments:         Anesthesia Quick Evaluation

## 2019-05-01 NOTE — Anesthesia Postprocedure Evaluation (Signed)
Anesthesia Post Note  Patient: [name]  Procedure(s) Performed: CATARACT EXTRACTION PHACO AND INTRAOCULAR LENS PLACEMENT LEFT EYE CDE=11.73 (Left Eye)  Patient location during evaluation: Phase II Anesthesia Type: MAC Level of consciousness: awake and alert Pain management: pain level controlled Vital Signs Assessment: post-procedure vital signs reviewed and stable Respiratory status: spontaneous breathing, nonlabored ventilation and respiratory function stable Cardiovascular status: stable Postop Assessment: no apparent nausea or vomiting Anesthetic complications: no     Last Vitals:  Vitals:   05/01/19 0850  BP: 132/64  Pulse: 80  Resp: (!) 23  Temp: 36.5 C  SpO2: 96%    Last Pain:  Vitals:   05/01/19 0850  TempSrc: Oral  PainSc: 0-No pain                 Osmara Drummonds Hristova

## 2019-05-01 NOTE — Discharge Instructions (Signed)
Please discharge patient when stable, will follow up today with Dr. Narcissus Detwiler at the [facility] [place] office immediately following discharge.  Leave shield in place until visit.  All paperwork with discharge instructions will be given at the office.  [facility] [place] Address:  [address]  [place], [place] [address]             Monitored Anesthesia Care, Care After These instructions provide you with information about caring for yourself after your procedure. Your health care provider may also give you more specific instructions. Your treatment has been planned according to current medical practices, but problems sometimes occur. Call your health care provider if you have any problems or questions after your procedure. What can I expect after the procedure? After your procedure, you may:  Feel sleepy for several hours.  Feel clumsy and have poor balance for several hours.  Feel forgetful about what happened after the procedure.  Have poor judgment for several hours.  Feel nauseous or vomit.  Have a sore throat if you had a breathing tube during the procedure. Follow these instructions at home: For at least 24 hours after the procedure:      Have a responsible adult stay with you. It is important to have someone help care for you until you are awake and alert.  Rest as needed.  Do not: ? Participate in activities in which you could fall or become injured. ? Drive. ? Use heavy machinery. ? Drink alcohol. ? Take sleeping pills or medicines that cause drowsiness. ? Make important decisions or sign legal documents. ? Take care of children on your own. Eating and drinking  Follow the diet that is recommended by your health care provider.  If you vomit, drink water, juice, or soup when you can drink without vomiting.  Make sure you have little or no nausea before eating solid foods. General instructions  Take over-the-counter and  prescription medicines only as told by your health care provider.  If you have sleep apnea, surgery and certain medicines can increase your risk for breathing problems. Follow instructions from your health care provider about wearing your sleep device: ? Anytime you are sleeping, including during daytime naps. ? While taking prescription pain medicines, sleeping medicines, or medicines that make you drowsy.  If you smoke, do not smoke without supervision.  Keep all follow-up visits as told by your health care provider. This is important. Contact a health care provider if:  You keep feeling nauseous or you keep vomiting.  You feel light-headed.  You develop a rash.  You have a fever. Get help right away if:  You have trouble breathing. Summary  For several hours after your procedure, you may feel sleepy and have poor judgment.  Have a responsible adult stay with you for at least 24 hours or until you are awake and alert. This information is not intended to replace advice given to you by your health care provider. Make sure you discuss any questions you have with your health care provider. Document Revised: 04/01/2017 Document Reviewed: 04/24/2015 Elsevier Patient Education  2020 [employer]vier Inc.  

## 2019-05-01 NOTE — Interval H&P Note (Signed)
History and Physical Interval Note: The H and P was reviewed and updated. The patient was examined.  No changes were found after exam.  The surgical eye was marked.  05/01/2019 9:23 AM  [name]  has presented today for surgery, with the diagnosis of Nuclear sclerotic cataract - Left eye.  The various methods of treatment have been discussed with the patient and family. After consideration of risks, benefits and other options for treatment, the patient has consented to  Procedure(s) with comments: CATARACT EXTRACTION PHACO AND INTRAOCULAR LENS PLACEMENT LEFT EYE (Left) - left as a surgical intervention.  The patient's history has been reviewed, patient examined, no change in status, stable for surgery.  I have reviewed the patient's chart and labs.  Questions were answered to the patient's satisfaction.     Baruch Goldmann

## 2019-05-01 NOTE — Transfer of Care (Signed)
Immediate Anesthesia Transfer of Care Note  Patient: [name]  Procedure(s) Performed: CATARACT EXTRACTION PHACO AND INTRAOCULAR LENS PLACEMENT LEFT EYE CDE=11.73 (Left Eye)  Patient Location: PACU  Anesthesia Type:MAC  Level of Consciousness: awake, alert  and oriented  Airway & Oxygen Therapy: Patient Spontanous Breathing  Post-op Assessment: Report given to RN and Post -op Vital signs reviewed and stable  Post vital signs: Reviewed and stable  Last Vitals:  Vitals Value Taken Time  BP    Temp    Pulse    Resp    SpO2      Last Pain:  Vitals:   05/01/19 0850  TempSrc: Oral  PainSc: 0-No pain         Complications: No apparent anesthesia complications

## 2019-05-01 NOTE — Op Note (Signed)
Date of procedure: 05/01/19  Pre-operative diagnosis: Visually significant age-related combined cataract, Left Eye (H25.812)  Post-operative diagnosis: Visually significant age-related combined cataract, Left Eye (H25.812)  Procedure: Removal of cataract via phacoemulsification and insertion of intra-ocular lens [name] and [place]  +23.5D into the capsular bag of the Left Eye  Attending surgeon: Gerda Diss. Martino Tompson, MD, MA  Anesthesia: MAC, Topical [place]  Complications: None  Estimated Blood Loss: <43m (minimal)  Specimens: None  Implants: As above  Indications:  Visually significant age-related cataract, Left Eye  Procedure:  The patient was seen and identified in the pre-operative area. The operative eye was identified and dilated.  The operative eye was marked.  Topical anesthesia was administered to the operative eye.     The patient was then to the operative suite and placed in the supine position.  A timeout was performed confirming the patient, procedure to be performed, and all other relevant information.   The patient's face was prepped and draped in the usual fashion for intra-ocular surgery.  A lid speculum was placed into the operative eye and the surgical microscope moved into place and focused.  An inferotemporal paracentesis was created using a 20 gauge paracentesis blade.  Shugarcaine was injected into the anterior chamber.  Viscoelastic was injected into the anterior chamber.  A temporal clear-corneal main wound incision was created using a 2.430mmicrokeratome.  A continuous curvilinear capsulorrhexis was initiated using an irrigating cystitome and completed using capsulorrhexis forceps.  Hydrodissection and hydrodeliniation were performed.  Viscoelastic was injected into the anterior chamber.  A phacoemulsification handpiece and a chopper as a second instrument were used to remove the nucleus and epinucleus. The irrigation/aspiration handpiece was used to remove  any remaining cortical material.   The capsular bag was reinflated with viscoelastic, checked, and found to be intact.  The intraocular lens was inserted into the capsular bag.  The irrigation/aspiration handpiece was used to remove any remaining viscoelastic.  The clear corneal wound and paracentesis wounds were then hydrated and checked with Weck-Cels to be watertight.  The lid-speculum was removed.  The drape was removed.  The patient's face was cleaned with a wet and dry 4x4.   A clear shield was taped over the eye. The patient was taken to the post-operative care unit in good condition, having tolerated the procedure well.  Post-Op Instructions: [facility] and will receive all other orders and instructions.

## 2019-05-08 NOTE — H&P (Signed)
Surgical History & Physical  Patient Name: [name] DOB: Nov 25, 1945  Surgery: Cataract extraction with intraocular lens implant phacoemulsification; Right Eye  Surgeon: Baruch Goldmann MD Surgery Date:  05/15/2019 Pre-Op Date:  05/07/2019  HPI: A 85 Yr. old female patient Patient is here for a 1 week PO after cataract surgery OS. She states that her distance vision has greatly improved after the surgery. She complains of a pressure sensation temporally OS since the surgery. She now complains of blurry vision OD. She can tell a difference in clarity and brightness between each eye. This is This is negatively affecting the patient's quality of life, so she would like to proceed with cataract surgery OD. HPI was performed by Baruch Goldmann .  Medical History: Phaco w/ IOL OS (2021) Cataracts OD Arthritis Cancer Hardening of arteries Polyps in stomach HX of di... High Blood Pressure LDL  Review of Systems Cardiovascular High Blood Pressure Respiratory COPD All recorded systems are negative except as noted above.  Social   Current every day smoker   Medication Vigamox, Ilevro, Prednisolone acetate 1%,  Acetaminophinen, Proair respiClick, Amlodipine Besylate, Azelastine, Calcium Carbonate, Fluticasone, Atrovent, Loratadine, Montelukast, Omega-3, Potassium chloride, Simvastatin, Symbicort Inhalant Product, Zanaflex, Aspirin, Benazepril, Hydrocodone, Ibuprofen, Prednisolone acetate,   Sx/Procedures Phaco c IOL OS,  Diverticulitis Sx,   Drug Allergies  Penicillins, Sulfa,   History & Physical: Heent:  Cataract, Right eye NECK: supple without bruits LUNGS: lungs clear to auscultation CV: regular rate and rhythm Abdomen: soft and non-tender  Impression & Plan: Assessment: 1.  CATARACT EXTRACTION STATUS; Left Eye (Z98.42) 2.  COMBINED FORMS AGE RELATED CATARACT; Both Eyes (H25.813)  Plan: 1.  1 week after cataract surgery. Doing well with improved vision and normal eye  pressure. Call with any problems or concerns. Stop Vigamox. Continue Ilevro 1 drop 1x/day for 3 more weeks. Continue Pred Acetate 1 drop 2x/day for 3 more weeks. 2.  Dilates well - shugarcaine by protocol. Cataract accounts for the patient's decreased vision. This visual impairment is not correctable with a tolerable change in glasses or contact lenses. Cataract surgery with an implantation of a new lens should significantly improve the visual and functional status of the patient. Discussed all risks, benefits, alternatives, and potential complications. Discussed the procedures and recovery. Patient desires to have surgery. A-scan ordered and performed today for intra-ocular lens calculations. The surgery will be performed in order to improve vision for driving, reading, and for eye examinations. Recommend phacoemulsification with intra-ocular lens. Right Eye. Surgery required to correct imbalance of vision.

## 2019-05-11 ENCOUNTER — Telehealth: Payer: Self-pay

## 2019-05-11 DIAGNOSIS — H25811 Combined forms of age-related cataract, right eye: Secondary | ICD-10-CM | POA: Diagnosis not present

## 2019-05-11 NOTE — Telephone Encounter (Signed)
Pt LVM that she needed a call back, called pt back, she is complaining with a possible sinus issue, however she is coughing up dark mucus, her chest is tight and stomach is swelling.  I advised pt that she needed to be seen in the ER or Urgent Care, pt advised she didn't have transportation , I called [facility] and they advised 3 days, they have tight schedules with limited resources.  Pt said she would ask her granddaughter.

## 2019-05-12 ENCOUNTER — Other Ambulatory Visit: Payer: Self-pay

## 2019-05-12 ENCOUNTER — Ambulatory Visit (INDEPENDENT_AMBULATORY_CARE_PROVIDER_SITE_OTHER): Payer: Medicare Other | Admitting: Orthopaedic Surgery

## 2019-05-12 ENCOUNTER — Encounter: Payer: Self-pay | Admitting: Orthopaedic Surgery

## 2019-05-12 VITALS — Temp 98.3°F | Ht 64.0 in | Wt 179.0 lb

## 2019-05-12 DIAGNOSIS — G8929 Other chronic pain: Secondary | ICD-10-CM

## 2019-05-12 DIAGNOSIS — F17210 Nicotine dependence, cigarettes, uncomplicated: Secondary | ICD-10-CM

## 2019-05-12 DIAGNOSIS — M25561 Pain in right knee: Secondary | ICD-10-CM | POA: Diagnosis not present

## 2019-05-12 NOTE — Patient Instructions (Signed)

## 2019-05-12 NOTE — Progress Notes (Signed)
Patient [name], female DOB:07-Aug-1945, 74 y.o. WUJ:[id number]  Chief Complaint  Patient presents with  . Knee Pain    R/ about the same    HPI  [name] is a 74 y.o. female who has right knee pain. She has swelling and popping but no redness, no giving way, no new trauma. She is taking her medicine. She has good and bad days depending on how much she walks.  Cold weather has made it worse.   Body mass index is 30.73 kg/m.  ROS  Review of Systems  HENT: Negative for congestion.   Respiratory: Positive for shortness of breath. Negative for cough.   Cardiovascular: Negative for chest pain and leg swelling.  Endocrine: Positive for cold intolerance.  Musculoskeletal: Positive for arthralgias, gait problem, joint swelling and myalgias.  Allergic/Immunologic: Positive for environmental allergies.  All other systems reviewed and are negative.   All other systems reviewed and are negative.  The following is a summary of the past history medically, past history surgically, known current medicines, social history and family history.  This information is gathered electronically by the computer from prior information and documentation.  I review this each visit and have found including this information at this point in the chart is beneficial and informative.    Past Medical History:  Diagnosis Date  . Asthma   . Breast cancer ([place]) 2007   Stage I (T1b N0 M0), grade 1 well-differentiated carcinoma of the left breast status, post lumpectomy followed by radiation therapy. Her estrogen receptor receptors were 93%, progesterone receptors 67%. HER-2/neu was negative. No lymphovascular space invasion was seen. All margins were clear. Ki-67 marker was low at 1% with surgery on 11/15/2004. Treated then with post-lumpectomy radiation, finish  . Breast cancer, left breast ([place]) 2007  . COPD (chronic obstructive pulmonary disease) ([place])   . Diabetes mellitus without complication  ([place])   . Diverticula of colon   . Hyperlipidemia   . Hypertension   . Kidney stones   . Nicotine dependence   . Osteoarthritis     Past Surgical History:  Procedure Laterality Date  . ABDOMINAL HYSTERECTOMY    . BREAST SURGERY  2005 approx   left lumpectomy   . CATARACT EXTRACTION W/PHACO Left 05/01/2019   Procedure: CATARACT EXTRACTION PHACO AND INTRAOCULAR LENS PLACEMENT LEFT EYE CDE=11.73;  Surgeon: Baruch Goldmann, MD;  Location: [facility];  Service: Ophthalmology;  Laterality: Left;  left  . CHOLECYSTECTOMY N/A 05/26/2014   Procedure: LAPAROSCOPIC CHOLECYSTECTOMY;  Surgeon: Aviva Signs Md, MD;  Location: [facility] ORS;  Service: General;  Laterality: N/A;  . COLECTOMY     2005, diverticulitis  . COLONOSCOPY N/A 05/20/2017   Procedure: COLONOSCOPY;  Surgeon: Danie Binder, MD;  Location: [facility] ENDO SUITE;  Service: Endoscopy;  Laterality: N/A;  1:45pm  . DILATION AND CURETTAGE OF UTERUS    . ESOPHAGOGASTRODUODENOSCOPY N/A 05/20/2017   Procedure: ESOPHAGOGASTRODUODENOSCOPY (EGD);  Surgeon: Danie Binder, MD;  Location: [facility];  Service: Endoscopy;  Laterality: N/A;  . left breast      cancer, in 2006  . TUBAL LIGATION      Family History  Problem Relation Age of Onset  . Breast cancer Mother   . COPD Father   . Emphysema Father   . Throat cancer Sister   . Glaucoma Brother   . Schizophrenia Brother   . Heart attack Brother   . Lung cancer Sister        former smoker  .  Hepatitis C Daughter   . Seizures Daughter   . SIDS Son   . Colon cancer Neg Hx   . Colon polyps Neg Hx     Social History Social History   Tobacco Use  . Smoking status: Current Every Day Smoker    Packs/day: 1.00    Years: 50.00    Pack years: 50.00    Types: Cigarettes  . Smokeless tobacco: Never Used  . Tobacco comment: cigaretes  now 10 to 15 per day  Substance Use Topics  . Alcohol use: No    Alcohol/week: 0.0 standard drinks  . Drug use: No    Allergies  Allergen Reactions  .  Penicillins Hives, Shortness Of Breath and Swelling    Has patient had a PCN reaction causing immediate rash, facial/tongue/throat swelling, SOB or lightheadedness with hypotension: yes Has patient had a PCN reaction causing severe rash involving mucus membranes or skin necrosis:no Has patient had a PCN reaction that required hospitalization: yes Has patient had a PCN reaction occurring within the last 10 years: no If all of the above answers are "NO", then may proceed with Cephalosporin use.  . Sulfonamide Derivatives Other (See Comments)    Shaking all over, seizure like symptoms. Hospitalization resulted     Current Outpatient Medications  Medication Sig Dispense Refill  . acetaminophen (TYLENOL) 325 MG tablet Take 650 mg by mouth every 6 (six) hours as needed for moderate pain.     Marland Kitchen albuterol (PROVENTIL) (2.5 MG/3ML) 0.083% nebulizer solution INHALE ONE VIAL VIA NEBULIZER THREE TIMES DAILY. (Patient taking differently: Take 2.5 mg by nebulization 3 (three) times daily as needed for wheezing or shortness of breath. ) 300 mL 0  . amLODipine (NORVASC) 5 MG tablet TAKE 1 TABLET BY MOUTH ONCE A DAY. (Patient taking differently: Take 5 mg by mouth daily. ) 90 tablet 0  . aspirin EC 81 MG tablet Take 81 mg by mouth daily.    . ATROVENT HFA 17 MCG/ACT inhaler INHALE 2 PUFFS BY MOUTH FOUR TIMES A DAY. (Patient taking differently: Inhale 2 puffs into the lungs in the morning, at noon, in the evening, and at bedtime. ) 12.9 g 0  . azelastine (ASTELIN) 0.1 % nasal spray PLACE 2 SPRAYS INTO EACH NOSTRIL TWICE DAILY. (Patient taking differently: Place 2 sprays into both nostrils 2 (two) times daily. PLACE 2 SPRAYS INTO EACH NOSTRIL TWICE DAILY.) 30 mL 5  . benazepril (LOTENSIN) 40 MG tablet TAKE ONE TABLET BY MOUTH DAILY. (Patient taking differently: Take 40 mg by mouth daily. ) 90 tablet 0  . budesonide-formoterol (SYMBICORT) 160-4.5 MCG/ACT inhaler Inhale 2 puffs into the lungs 2 (two) times daily. 10.2  g 3  . Calcium Carbonate-Vitamin D (CALCIUM 600 + D PO) Take 1 tablet by mouth 2 (two) times daily. 600/400    . cyclobenzaprine (FLEXERIL) 5 MG tablet TAKE ONE TABLET BY MOUTH AT BEDTIME AS NEEDED. (Patient taking differently: Take 5 mg by mouth at bedtime as needed for muscle spasms. ) 30 tablet 0  . famotidine (PEPCID) 20 MG tablet Take 20 mg by mouth at bedtime.     . fluticasone (FLONASE) 50 MCG/ACT nasal spray Place 2 sprays into both nostrils daily. (Patient taking differently: Place 2 sprays into both nostrils in the morning and at bedtime. ) 16 g 2  . loratadine (CLARITIN) 10 MG tablet TAKE 1 TABLET BY MOUTH ONCE DAILY FOR ALLERGIES. (Patient taking differently: Take 10 mg by mouth daily. TAKE 1 TABLET BY MOUTH ONCE  DAILY FOR ALLERGIES.) 30 tablet 4  . montelukast (SINGULAIR) 10 MG tablet TAKE 1 TABLET BY MOUTH AT BEDTIME. (Patient taking differently: Take 10 mg by mouth at bedtime. ) 90 tablet 0  . polyethylene glycol powder (GLYCOLAX/MIRALAX) powder MIX 1 CAPFUL IN 8 OUNCES OF JUICE OR WATER AND DRINK ONCE DAILY. (Patient taking differently: Take 17 g by mouth daily. ) 527 g 5  . potassium chloride SA (KLOR-CON) 20 MEQ tablet TAKE 1 TABLET BY MOUTH DAILY. (Patient taking differently: Take 20 mEq by mouth daily. ) 30 tablet 0  . simvastatin (ZOCOR) 40 MG tablet TAKE (1) TABLET BY MOUTH AT BEDTIME. 30 tablet 0  . tiZANidine (ZANAFLEX) 4 MG tablet TAKE ONE TABLET AT NIGHT BEFORE BED AS NEEDED FOR MUSCLE SPASMS. 30 tablet 0  . traMADol (ULTRAM) 50 MG tablet TAKE (1) TABLET BY MOUTH EVERY SIX HOURS AS NEEDED. (Patient taking differently: Take 50 mg by mouth every 6 (six) hours as needed for moderate pain. TAKE (1) TABLET BY MOUTH EVERY SIX HOURS AS NEEDED.) 60 tablet 2   No current facility-administered medications for this visit.     Physical Exam  Temperature 98.3 F (36.8 C), height '5\' 4"'  (1.626 m), weight 179 lb (81.2 kg).  Constitutional: overall normal hygiene, normal nutrition,  well developed, normal grooming, normal body habitus. Assistive device:none  Musculoskeletal: gait and station Limp right, muscle tone and strength are normal, no tremors or atrophy is present.  .  Neurological: coordination overall normal.  Deep tendon reflex/nerve stretch intact.  Sensation normal.  Cranial nerves II-XII intact.   Skin:   Normal overall no scars, lesions, ulcers or rashes. No psoriasis.  Psychiatric: Alert and oriented x 3.  Recent memory intact, remote memory unclear.  Normal mood and affect. Well groomed.  Good eye contact.  Cardiovascular: overall no swelling, no varicosities, no edema bilaterally, normal temperatures of the legs and arms, no clubbing, cyanosis and good capillary refill.  Lymphatic: palpation is normal.  Right knee has some effusion, crepitus, ROM 0 to 110, slight limp right, stable, NV intact.  All other systems reviewed and are negative   The patient has been educated about the nature of the problem(s) and counseled on treatment options.  The patient appeared to understand what I have discussed and is in agreement with it.  Encounter Diagnoses  Name Primary?  . Chronic pain of right knee Yes  . Cigarette nicotine dependence without complication     PLAN Call if any problems.  Precautions discussed.  Continue current medications.   Return to clinic 2 months   Electronically Signed Sanjuana Kava, MD 4/27/20212:36 PM

## 2019-05-12 NOTE — Telephone Encounter (Signed)
noted 

## 2019-05-13 ENCOUNTER — Encounter (HOSPITAL_COMMUNITY)
Admit: 2019-05-13 | Discharge: 2019-05-13 | Disposition: A | Discharge status: Home / Self Care | Payer: Medicare Other | Attending: Ophthalmology | Admitting: Ophthalmology

## 2019-05-13 ENCOUNTER — Other Ambulatory Visit (HOSPITAL_COMMUNITY)
Admit: 2019-05-13 | Discharge: 2019-05-13 | Disposition: A | Discharge status: Home / Self Care | Payer: Medicare Other | Source: Ambulatory Visit | Attending: Ophthalmology | Admitting: Ophthalmology

## 2019-05-13 ENCOUNTER — Other Ambulatory Visit: Payer: Self-pay

## 2019-05-13 DIAGNOSIS — Z01812 Encounter for preprocedural laboratory examination: Secondary | ICD-10-CM | POA: Diagnosis not present

## 2019-05-13 DIAGNOSIS — Z20822 Contact with and (suspected) exposure to covid-19: Secondary | ICD-10-CM | POA: Insufficient documentation

## 2019-05-14 LAB — SARS CORONAVIRUS 2 (TAT 6-24 HRS): SARS Coronavirus 2: NEGATIVE

## 2019-05-15 ENCOUNTER — Ambulatory Visit (HOSPITAL_COMMUNITY)
Admission: RE | Admit: 2019-05-15 | Discharge: 2019-05-15 | Disposition: A | Discharge status: Home / Self Care | Payer: Medicare Other | Attending: Ophthalmology | Admitting: Ophthalmology

## 2019-05-15 ENCOUNTER — Ambulatory Visit (HOSPITAL_COMMUNITY): Payer: Medicare Other | Admitting: Anesthesiology

## 2019-05-15 ENCOUNTER — Encounter (HOSPITAL_COMMUNITY): Payer: Self-pay | Admitting: Ophthalmology

## 2019-05-15 ENCOUNTER — Encounter (HOSPITAL_COMMUNITY)
Admission: RE | Disposition: A | Discharge status: Home / Self Care | Payer: Self-pay | Source: Home / Self Care | Attending: Ophthalmology

## 2019-05-15 DIAGNOSIS — Z853 Personal history of malignant neoplasm of breast: Secondary | ICD-10-CM | POA: Diagnosis not present

## 2019-05-15 DIAGNOSIS — Z7951 Long term (current) use of inhaled steroids: Secondary | ICD-10-CM | POA: Insufficient documentation

## 2019-05-15 DIAGNOSIS — Z791 Long term (current) use of non-steroidal anti-inflammatories (NSAID): Secondary | ICD-10-CM | POA: Diagnosis not present

## 2019-05-15 DIAGNOSIS — E7800 Pure hypercholesterolemia, unspecified: Secondary | ICD-10-CM | POA: Diagnosis not present

## 2019-05-15 DIAGNOSIS — I10 Essential (primary) hypertension: Secondary | ICD-10-CM | POA: Diagnosis not present

## 2019-05-15 DIAGNOSIS — Z880 Allergy status to penicillin: Secondary | ICD-10-CM | POA: Insufficient documentation

## 2019-05-15 DIAGNOSIS — F17200 Nicotine dependence, unspecified, uncomplicated: Secondary | ICD-10-CM | POA: Insufficient documentation

## 2019-05-15 DIAGNOSIS — E1136 Type 2 diabetes mellitus with diabetic cataract: Secondary | ICD-10-CM | POA: Insufficient documentation

## 2019-05-15 DIAGNOSIS — H25811 Combined forms of age-related cataract, right eye: Secondary | ICD-10-CM | POA: Diagnosis not present

## 2019-05-15 DIAGNOSIS — Z79899 Other long term (current) drug therapy: Secondary | ICD-10-CM | POA: Diagnosis not present

## 2019-05-15 DIAGNOSIS — M1990 Unspecified osteoarthritis, unspecified site: Secondary | ICD-10-CM | POA: Diagnosis not present

## 2019-05-15 DIAGNOSIS — Z882 Allergy status to sulfonamides status: Secondary | ICD-10-CM | POA: Diagnosis not present

## 2019-05-15 DIAGNOSIS — I2510 Atherosclerotic heart disease of native coronary artery without angina pectoris: Secondary | ICD-10-CM | POA: Diagnosis not present

## 2019-05-15 DIAGNOSIS — J449 Chronic obstructive pulmonary disease, unspecified: Secondary | ICD-10-CM | POA: Insufficient documentation

## 2019-05-15 DIAGNOSIS — Z7982 Long term (current) use of aspirin: Secondary | ICD-10-CM | POA: Diagnosis not present

## 2019-05-15 HISTORY — PX: CATARACT EXTRACTION W/PHACO: SHX586

## 2019-05-15 SURGERY — PHACOEMULSIFICATION, CATARACT, WITH IOL INSERTION
Anesthesia: Monitor Anesthesia Care | Site: Eye | Laterality: Right

## 2019-05-15 MED ORDER — MIDAZOLAM HCL 2 MG/2ML IJ SOLN
INTRAMUSCULAR | Status: DC | PRN
  Administered 2019-05-15 (×2): 1 mg via INTRAVENOUS

## 2019-05-15 MED ORDER — PROVISC 10 MG/ML IO SOLN
INTRAOCULAR | Status: DC | PRN
  Administered 2019-05-15: 0.85 mL via INTRAOCULAR

## 2019-05-15 MED ORDER — PHENYLEPHRINE HCL 2.5 % OP SOLN
1.0000 [drp] | OPHTHALMIC | Status: AC | PRN
  Administered 2019-05-15 (×3): 1 [drp] via OPHTHALMIC

## 2019-05-15 MED ORDER — SODIUM HYALURONATE 23 MG/ML IO SOLN
INTRAOCULAR | Status: DC | PRN
  Administered 2019-05-15: 0.6 mL via INTRAOCULAR

## 2019-05-15 MED ORDER — POVIDONE-IODINE 5 % OP SOLN
OPHTHALMIC | Status: DC | PRN
  Administered 2019-05-15: 1 via OPHTHALMIC

## 2019-05-15 MED ORDER — TETRACAINE HCL 0.5 % OP SOLN
1.0000 [drp] | OPHTHALMIC | Status: DC | PRN
  Administered 2019-05-15 (×2): 1 [drp] via OPHTHALMIC

## 2019-05-15 MED ORDER — BSS IO SOLN
INTRAOCULAR | Status: DC | PRN
  Administered 2019-05-15: 15 mL via INTRAOCULAR

## 2019-05-15 MED ORDER — LIDOCAINE HCL 3.5 % OP GEL
1.0000 "application " | Freq: Once | OPHTHALMIC | Status: AC
  Administered 2019-05-15: 1 via OPHTHALMIC

## 2019-05-15 MED ORDER — LIDOCAINE HCL (PF) 1 % IJ SOLN
INTRAOCULAR | Status: DC | PRN
  Administered 2019-05-15: 10:00:00 1 mL via OPHTHALMIC

## 2019-05-15 MED ORDER — CYCLOPENTOLATE-PHENYLEPHRINE 0.2-1 % OP SOLN
1.0000 [drp] | OPHTHALMIC | Status: DC | PRN
  Administered 2019-05-15 (×2): 1 [drp] via OPHTHALMIC

## 2019-05-15 MED ORDER — MIDAZOLAM HCL 2 MG/2ML IJ SOLN
INTRAMUSCULAR | Status: AC
  Filled 2019-05-15: qty 2

## 2019-05-15 MED ORDER — EPINEPHRINE PF 1 MG/ML IJ SOLN
INTRAOCULAR | Status: DC | PRN
  Administered 2019-05-15: 10:00:00 500 mL

## 2019-05-15 SURGICAL SUPPLY — 13 items
CLOTH BEACON ORANGE TIMEOUT ST (SAFETY) ×2 IMPLANT
EYE SHIELD UNIVERSAL CLEAR (GAUZE/BANDAGES/DRESSINGS) ×2 IMPLANT
GLOVE BIOGEL PI IND STRL 7.0 (GLOVE) IMPLANT
GLOVE BIOGEL PI INDICATOR 7.0 (GLOVE) ×4
LENS ALC ACRYL/TECN (Ophthalmic Related) ×2 IMPLANT
NDL HYPO 18GX1.5 BLUNT FILL (NEEDLE) IMPLANT
NEEDLE HYPO 18GX1.5 BLUNT FILL (NEEDLE) ×3 IMPLANT
PAD ARMBOARD 7.5X6 YLW CONV (MISCELLANEOUS) ×2 IMPLANT
SYR TB 1ML LL NO SAFETY (SYRINGE) ×2 IMPLANT
TAPE SURG TRANSPORE 1 IN (GAUZE/BANDAGES/DRESSINGS) IMPLANT
TAPE SURGICAL TRANSPORE 1 IN (GAUZE/BANDAGES/DRESSINGS) ×3
VISCOELASTIC ADDITIONAL (OPHTHALMIC RELATED) ×2 IMPLANT
WATER STERILE IRR 250ML POUR (IV SOLUTION) ×2 IMPLANT

## 2019-05-15 NOTE — Transfer of Care (Signed)
Immediate Anesthesia Transfer of Care Note  Patient: [name]  Procedure(s) Performed: CATARACT EXTRACTION PHACO AND INTRAOCULAR LENS PLACEMENT RIGHT EYE (Right Eye)  Patient Location: Short Stay  Anesthesia Type:MAC  Level of Consciousness: awake, alert , oriented and patient cooperative  Airway & Oxygen Therapy: Patient Spontanous Breathing  Post-op Assessment: Report given to RN and Post -op Vital signs reviewed and stable  Post vital signs: Reviewed and stable  Last Vitals:  Vitals Value Taken Time  BP    Temp    Pulse    Resp    SpO2      Last Pain:  Vitals:   05/15/19 0915  TempSrc: Oral  PainSc: 0-No pain      Patients Stated Pain Goal: 5 (02/72/53 6644)  Complications: No apparent anesthesia complications

## 2019-05-15 NOTE — Discharge Instructions (Signed)
Please discharge patient when stable, will follow up today with Dr. Mcarthur Ivins at the [facility] [place] office immediately following discharge.  Leave shield in place until visit.  All paperwork with discharge instructions will be given at the office.  [facility] Address:  [address]  [place], [place] [address]  

## 2019-05-15 NOTE — Anesthesia Preprocedure Evaluation (Signed)
Anesthesia Evaluation  Patient identified by MRN, date of birth, ID band Patient awake    Reviewed: Allergy & Precautions, NPO status , Patient's Chart, lab work & pertinent test results  Airway Mallampati: III  TM Distance: >3 FB Neck ROM: Full    Dental  (+) Edentulous Upper, Partial Lower   Pulmonary shortness of breath and with exertion, asthma , COPD,  COPD inhaler, Current Smoker and Patient abstained from smoking.,    Pulmonary exam normal breath sounds clear to auscultation       Cardiovascular Exercise Tolerance: Good hypertension, Pt. on medications + CAD  Normal cardiovascular exam Rhythm:Regular Rate:Normal  15-Sep-2018 15:02:39 [facility] ROUTINE RECORD Sinus rhythm Prolonged PR interval Probable anteroseptal infarct, old Baseline wander in lead(s) V4   Neuro/Psych Tremors   Neuromuscular disease (tremors ) negative psych ROS   GI/Hepatic negative GI ROS, Neg liver ROS,   Endo/Other  diabetes, Well Controlled, Type 2  Renal/GU Renal disease (stones)     Musculoskeletal  (+) Arthritis , Osteoarthritis,    Abdominal   Peds  Hematology   Anesthesia Other Findings Left breast cancer  Reproductive/Obstetrics                             Anesthesia Physical  Anesthesia Plan  ASA: III  Anesthesia Plan: MAC   Post-op Pain Management:    Induction:   PONV Risk Score and Plan:   Airway Management Planned: Nasal Cannula and Natural Airway  Additional Equipment:   Intra-op Plan:   Post-operative Plan:   Informed Consent: I have reviewed the patients History and Physical, chart, labs and discussed the procedure including the risks, benefits and alternatives for the proposed anesthesia with the patient or authorized representative who has indicated his/her understanding and acceptance.     Dental advisory given  Plan Discussed with: CRNA and  Surgeon  Anesthesia Plan Comments:         Anesthesia Quick Evaluation

## 2019-05-15 NOTE — Op Note (Signed)
Date of procedure: 05/15/19  Pre-operative diagnosis:  Visually significant combined form age-related cataract, Right Eye (H25.811)  Post-operative diagnosis:  Visually significant combined form age-related cataract, Right Eye (H25.811)  Procedure: Removal of cataract via phacoemulsification and insertion of intra-ocular lens [name] and [employer] DCB00  +22.0D into the capsular bag of the Right Eye  Attending surgeon: Gerda Diss. Stellarose Cerny, MD, MA  Anesthesia: MAC, Topical Akten  Complications: None  Estimated Blood Loss: <5m (minimal)  Specimens: None  Implants: As above  Indications:  Visually significant age-related cataract, Right Eye  Procedure:  The patient was seen and identified in the pre-operative area. The operative eye was identified and dilated.  The operative eye was marked.  Topical anesthesia was administered to the operative eye.     The patient was then to the operative suite and placed in the supine position.  A timeout was performed confirming the patient, procedure to be performed, and all other relevant information.   The patient's face was prepped and draped in the usual fashion for intra-ocular surgery.  A lid speculum was placed into the operative eye and the surgical microscope moved into place and focused.  A superotemporal paracentesis was created using a 20 gauge paracentesis blade.  Shugarcaine was injected into the anterior chamber.  Viscoelastic was injected into the anterior chamber.  A temporal clear-corneal main wound incision was created using a 2.473mmicrokeratome.  A continuous curvilinear capsulorrhexis was initiated using an irrigating cystitome and completed using capsulorrhexis forceps.  Hydrodissection and hydrodeliniation were performed.  Viscoelastic was injected into the anterior chamber.  A phacoemulsification handpiece and a chopper as a second instrument were used to remove the nucleus and epinucleus. The irrigation/aspiration handpiece was  used to remove any remaining cortical material.   The capsular bag was reinflated with viscoelastic, checked, and found to be intact.  The intraocular lens was inserted into the capsular bag.  The irrigation/aspiration handpiece was used to remove any remaining viscoelastic.  The clear corneal wound and paracentesis wounds were then hydrated and checked with Weck-Cels to be watertight.  The lid-speculum was removed.  The drape was removed.  The patient's face was cleaned with a wet and dry 4x4.  A clear shield was taped over the eye. The patient was taken to the post-operative care unit in good condition, having tolerated the procedure well.  Post-Op Instructions: [facility] and will receive all other orders and instructions.

## 2019-05-15 NOTE — Anesthesia Postprocedure Evaluation (Signed)
Anesthesia Post Note  Patient: [name]  Procedure(s) Performed: CATARACT EXTRACTION PHACO AND INTRAOCULAR LENS PLACEMENT RIGHT EYE (Right Eye)  Patient location during evaluation: Short Stay Anesthesia Type: MAC Level of consciousness: awake and alert Pain management: pain level controlled Vital Signs Assessment: post-procedure vital signs reviewed and stable Respiratory status: spontaneous breathing Cardiovascular status: stable Postop Assessment: no apparent nausea or vomiting Anesthetic complications: no     Last Vitals:  Vitals:   05/15/19 0915 05/15/19 1020  BP: (!) 131/56 (!) 179/95  Pulse: 81 83  Resp: 17 17  Temp: 36.8 C   SpO2: 92% 94%    Last Pain:  Vitals:   05/15/19 1020  TempSrc: Oral  PainSc: 0-No pain                 Everette Rank

## 2019-05-15 NOTE — Interval H&P Note (Signed)
History and Physical Interval Note: The H and P was reviewed and updated. The patient was examined.  No changes were found after exam.  The surgical eye was marked.  05/15/2019 9:52 AM  [name]  has presented today for surgery, with the diagnosis of Nuclear sclerotic cataract - Right eye.  The various methods of treatment have been discussed with the patient and family. After consideration of risks, benefits and other options for treatment, the patient has consented to  Procedure(s) with comments: CATARACT EXTRACTION PHACO AND INTRAOCULAR LENS PLACEMENT RIGHT EYE (Right) - CDE:  as a surgical intervention.  The patient's history has been reviewed, patient examined, no change in status, stable for surgery.  I have reviewed the patient's chart and labs.  Questions were answered to the patient's satisfaction.     Baruch Goldmann

## 2019-05-20 ENCOUNTER — Other Ambulatory Visit: Payer: Self-pay | Admitting: Family Medicine

## 2019-05-25 ENCOUNTER — Other Ambulatory Visit: Payer: Self-pay | Admitting: Family Medicine

## 2019-06-10 ENCOUNTER — Other Ambulatory Visit: Payer: Self-pay | Admitting: Family Medicine

## 2019-06-10 ENCOUNTER — Other Ambulatory Visit: Payer: Self-pay | Admitting: Orthopaedic Surgery

## 2019-06-10 NOTE — Telephone Encounter (Signed)
Rx request 

## 2019-06-22 ENCOUNTER — Ambulatory Visit (HOSPITAL_COMMUNITY): Payer: Medicare Other

## 2019-06-22 DIAGNOSIS — Z9841 Cataract extraction status, right eye: Secondary | ICD-10-CM | POA: Diagnosis not present

## 2019-06-22 DIAGNOSIS — Z9842 Cataract extraction status, left eye: Secondary | ICD-10-CM | POA: Diagnosis not present

## 2019-06-22 DIAGNOSIS — H524 Presbyopia: Secondary | ICD-10-CM | POA: Diagnosis not present

## 2019-06-23 ENCOUNTER — Other Ambulatory Visit: Payer: Self-pay

## 2019-06-23 ENCOUNTER — Ambulatory Visit (INDEPENDENT_AMBULATORY_CARE_PROVIDER_SITE_OTHER): Payer: Medicare Other | Admitting: Family Medicine

## 2019-06-23 ENCOUNTER — Encounter: Payer: Self-pay | Admitting: Family Medicine

## 2019-06-23 VITALS — BP 156/82 | HR 93 | Temp 97.5°F | Resp 15 | Ht 64.0 in | Wt 178.0 lb

## 2019-06-23 DIAGNOSIS — E785 Hyperlipidemia, unspecified: Secondary | ICD-10-CM

## 2019-06-23 DIAGNOSIS — J301 Allergic rhinitis due to pollen: Secondary | ICD-10-CM

## 2019-06-23 DIAGNOSIS — R7303 Prediabetes: Secondary | ICD-10-CM | POA: Diagnosis not present

## 2019-06-23 DIAGNOSIS — J449 Chronic obstructive pulmonary disease, unspecified: Secondary | ICD-10-CM | POA: Diagnosis not present

## 2019-06-23 DIAGNOSIS — E559 Vitamin D deficiency, unspecified: Secondary | ICD-10-CM

## 2019-06-23 DIAGNOSIS — F17210 Nicotine dependence, cigarettes, uncomplicated: Secondary | ICD-10-CM

## 2019-06-23 DIAGNOSIS — I10 Essential (primary) hypertension: Secondary | ICD-10-CM | POA: Diagnosis not present

## 2019-06-23 DIAGNOSIS — F17200 Nicotine dependence, unspecified, uncomplicated: Secondary | ICD-10-CM

## 2019-06-23 MED ORDER — AMLODIPINE BESYLATE 10 MG PO TABS
10.0000 mg | ORAL_TABLET | Freq: Every day | ORAL | 3 refills | Status: DC
Start: 2019-06-23 — End: 2019-11-16

## 2019-06-23 NOTE — Patient Instructions (Addendum)
Annual physical exam with MD and re eval blood pressure mid to end August, call if you need me before  Increase dose of amlodipine to 10 mg one daily  Please reduce salty food, please increase vegetable and fruit  Continue to reduce cigarettes need to quit   Please getting fasting lipid, cmp and EGFr, TSH, CBC  and vit D 1 week before next visit    Thanks for choosing [facility], we consider it a privelige to serve you.

## 2019-06-23 NOTE — Assessment & Plan Note (Signed)
Uncontrolled, increase amdipine 10 mg DASH diet and commitment to daily physical activity for a minimum of 30 minutes discussed and encouraged, as a part of hypertension management. The importance of attaining a healthy weight is also discussed.  BP/Weight 06/23/2019 05/15/2019 05/12/2019 05/01/2019 04/28/2019 04/09/2019 0/63/4949  Systolic BP 447 395 - 844 171 - 278  Diastolic BP 82 95 - 62 94 - 87  Wt. (Lbs) 178 - 179 - 180 170 179  BMI 30.55 - 30.73 - 30.9 29.18 29.79

## 2019-06-25 ENCOUNTER — Other Ambulatory Visit: Payer: Self-pay | Admitting: Family Medicine

## 2019-06-28 ENCOUNTER — Encounter: Payer: Self-pay | Admitting: Family Medicine

## 2019-06-28 NOTE — Assessment & Plan Note (Signed)
Hyperlipidemia:Low fat diet discussed and encouraged.   Lipid Panel  Lab Results  Component Value Date   CHOL 188 01/28/2019   HDL 82 01/28/2019   LDLCALC 87 01/28/2019   TRIG 101 01/28/2019   CHOLHDL 2.3 01/28/2019  Controlled, no change in medication Updated lab needed at/ before next visit.

## 2019-06-28 NOTE — Assessment & Plan Note (Signed)
Controlled, no change in medication  

## 2019-06-28 NOTE — Assessment & Plan Note (Signed)
Patient educated about the importance of limiting  Carbohydrate intake , the need to commit to daily physical activity for a minimum of 30 minutes , and to commit weight loss. The fact that changes in all these areas will reduce or eliminate all together the development of diabetes is stressed.   Diabetic Labs Latest Ref Rng & Units 04/28/2019 01/28/2019 10/03/2018 09/25/2018 09/15/2018  HbA1c 4.8 - 5.6 % 5.9(H) - - - -  Microalbumin Not Estab. ug/mL - - - - -  Micro/Creat Ratio 0.0 - 30.0 mg/g creat - - - - -  Chol <200 mg/dL - 188 222(H) - -  HDL > OR = 50 mg/dL - 82 85 - -  Calc LDL mg/dL (calc) - 87 110(H) - -  Triglycerides <150 mg/dL - 101 155(H) - -  Creatinine 0.44 - 1.00 mg/dL 0.90 - 0.89 0.78 0.81   BP/Weight 06/23/2019 05/15/2019 05/12/2019 05/01/2019 04/28/2019 04/09/2019 3/55/2174  Systolic BP 715 953 - 967 289 - 791  Diastolic BP 82 95 - 62 94 - 87  Wt. (Lbs) 178 - 179 - 180 170 179  BMI 30.55 - 30.73 - 30.9 29.18 29.79   Foot/eye exam completion dates Latest Ref Rng & Units 09/07/2014 06/10/2013  Eye Exam No Retinopathy No Retinopathy -  Foot Form Completion - - Done

## 2019-06-28 NOTE — Assessment & Plan Note (Signed)
Worsening due to continued smoking, cessation counseling done Use of daily inhalers to help  breathing is discussed

## 2019-06-28 NOTE — Progress Notes (Signed)
[name]     MRN: [id number]      DOB: 1945/03/24   HPI [name] is here for follow up and re-evaluation of chronic medical conditions, medication management and review of any available recent lab and radiology data.  Preventive health is updated, specifically  Cancer screening and Immunization.   . The PT denies any adverse reactions to current medications since the last visit.  There are no new concerns.  There are no specific complaints   ROS Denies recent fever or chills. Denies sinus pressure, nasal congestion, ear pain or sore throat. C/o chronic  chest congestion, productive cough and  wheezing. Denies chest pains, palpitations and leg swelling Denies abdominal pain, nausea, vomiting,diarrhea or constipation.   Denies dysuria, frequency, hesitancy or incontinence. C/o chronic  joint pain, swelling and limitation in mobility. Denies headaches, seizures, numbness, or tingling. Denies depression, anxiety or insomnia. Denies skin break down or rash.   PE  BP (!) 156/82   Pulse 93   Temp (!) 97.5 F (36.4 C) (Temporal)   Resp 15   Ht 5\' 4"  (1.626 m)   Wt 178 lb (80.7 kg)   SpO2 93%   BMI 30.55 kg/m   Patient alert and oriented and in no cardiopulmonary distress.  HEENT: No facial asymmetry, EOMI,     Neck supple .  Chest: scattered crackles an whhezes, adequate air entry  CVS: S1, S2 no murmurs, no S3.Regular rate.  ABD: Soft non tender.   Ext: No edema  MS: Adequate though reduced correctthree 5 ROM spine, shoulders, hips and knees.  Skin: Intact, no ulcerations or rash noted.  Psych: Good eye contact, normal affect. Memory intact not anxious or depressed appearing.  CNS: CN 2-12 intact, power,  normal throughout.no focal deficits noted.   Assessment & Plan  Hypertension goal BP (blood pressure) < 130/80 Uncontrolled, increase amdipine 10 mg DASH diet and commitment to daily physical activity for a minimum of 30 minutes discussed and  encouraged, as a part of hypertension management. The importance of attaining a healthy weight is also discussed.  BP/Weight 06/23/2019 05/15/2019 05/12/2019 05/01/2019 04/28/2019 04/09/2019 3/41/9379  Systolic BP 024 097 - 353 299 - 242  Diastolic BP 82 95 - 62 94 - 87  Wt. (Lbs) 178 - 179 - 180 170 179  BMI 30.55 - 30.73 - 30.9 29.18 29.79       Hyperlipidemia LDL goal <100 Hyperlipidemia:Low fat diet discussed and encouraged.   Lipid Panel  Lab Results  Component Value Date   CHOL 188 01/28/2019   HDL 82 01/28/2019   LDLCALC 87 01/28/2019   TRIG 101 01/28/2019   CHOLHDL 2.3 01/28/2019  Controlled, no change in medication Updated lab needed at/ before next visit.      NICOTINE ADDICTION Asked:confirms currently smokes cigarettes 5 to 10 daily Assess: Unwilling to quit but cutting back Advise: needs to QUIT to reduce risk of cancer, cardio and cerebrovascular disease Assist: counseled for 5 minutes and literature provided Arrange: follow up in 3 months   COPD (chronic obstructive pulmonary disease) with chronic bronchitis ([place]) Worsening due to continued smoking, cessation counseling done Use of daily inhalers to help  breathing is discussed  Prediabetes Patient educated about the importance of limiting  Carbohydrate intake , the need to commit to daily physical activity for a minimum of 30 minutes , and to commit weight loss. The fact that changes in all these areas will reduce or eliminate all together the development of  diabetes is stressed.   Diabetic Labs Latest Ref Rng & Units 04/28/2019 01/28/2019 10/03/2018 09/25/2018 09/15/2018  HbA1c 4.8 - 5.6 % 5.9(H) - - - -  Microalbumin Not Estab. ug/mL - - - - -  Micro/Creat Ratio 0.0 - 30.0 mg/g creat - - - - -  Chol <200 mg/dL - 188 222(H) - -  HDL > OR = 50 mg/dL - 82 85 - -  Calc LDL mg/dL (calc) - 87 110(H) - -  Triglycerides <150 mg/dL - 101 155(H) - -  Creatinine 0.44 - 1.00 mg/dL 0.90 - 0.89 0.78 0.81   BP/Weight  06/23/2019 05/15/2019 05/12/2019 05/01/2019 04/28/2019 04/09/2019 8/67/6195  Systolic BP 093 267 - 124 580 - 998  Diastolic BP 82 95 - 62 94 - 87  Wt. (Lbs) 178 - 179 - 180 170 179  BMI 30.55 - 30.73 - 30.9 29.18 29.79   Foot/eye exam completion dates Latest Ref Rng & Units 09/07/2014 06/10/2013  Eye Exam No Retinopathy No Retinopathy -  Foot Form Completion - - Done      Allergic rhinitis Controlled, no change in medication

## 2019-06-28 NOTE — Assessment & Plan Note (Signed)
Asked:confirms currently smokes cigarettes 5 to 10 daily Assess: Unwilling to quit but cutting back Advise: needs to QUIT to reduce risk of cancer, cardio and cerebrovascular disease Assist: counseled for 5 minutes and literature provided Arrange: follow up in 3 months

## 2019-06-29 ENCOUNTER — Other Ambulatory Visit: Payer: Self-pay | Admitting: Family Medicine

## 2019-06-29 ENCOUNTER — Ambulatory Visit (HOSPITAL_COMMUNITY)
Admission: RE | Admit: 2019-06-29 | Discharge: 2019-06-29 | Disposition: A | Discharge status: Home / Self Care | Payer: Medicare Other | Source: Ambulatory Visit | Attending: Nurse Practitioner | Admitting: Nurse Practitioner

## 2019-06-29 ENCOUNTER — Other Ambulatory Visit: Payer: Self-pay

## 2019-06-29 DIAGNOSIS — Z1231 Encounter for screening mammogram for malignant neoplasm of breast: Secondary | ICD-10-CM | POA: Diagnosis not present

## 2019-06-30 ENCOUNTER — Other Ambulatory Visit: Payer: Self-pay

## 2019-06-30 DIAGNOSIS — J3089 Other allergic rhinitis: Secondary | ICD-10-CM

## 2019-06-30 MED ORDER — FLUTICASONE PROPIONATE 50 MCG/ACT NA SUSP: 2.0000 | Freq: Every day | NASAL | 2 refills | Status: DC

## 2019-07-01 ENCOUNTER — Other Ambulatory Visit: Payer: Self-pay

## 2019-07-01 DIAGNOSIS — J3089 Other allergic rhinitis: Secondary | ICD-10-CM

## 2019-07-01 MED ORDER — AZELASTINE HCL 0.1 % NA SOLN: NASAL | 5 refills | Status: DC

## 2019-07-02 ENCOUNTER — Other Ambulatory Visit: Payer: Self-pay | Admitting: Family Medicine

## 2019-07-07 ENCOUNTER — Other Ambulatory Visit: Payer: Self-pay | Admitting: Orthopaedic Surgery

## 2019-07-09 ENCOUNTER — Telehealth: Payer: Self-pay

## 2019-07-09 NOTE — Telephone Encounter (Signed)
Tried calling patient back to discuss glucometer. No answer, no vm. Will try again later.

## 2019-07-09 NOTE — Telephone Encounter (Signed)
On no med so not covered. [facility] has most affordable option

## 2019-07-09 NOTE — Telephone Encounter (Signed)
Patient is asking for a glucometer and supplies to check her sugar. She states she is diet controlled or trying to control it with diet right now. Can I send in a prescription for a meter and supplies?

## 2019-07-14 ENCOUNTER — Ambulatory Visit (INDEPENDENT_AMBULATORY_CARE_PROVIDER_SITE_OTHER): Payer: Medicare Other | Admitting: Orthopaedic Surgery

## 2019-07-14 ENCOUNTER — Other Ambulatory Visit: Payer: Self-pay

## 2019-07-14 ENCOUNTER — Encounter: Payer: Self-pay | Admitting: Orthopaedic Surgery

## 2019-07-14 VITALS — Ht 64.0 in | Wt 176.0 lb

## 2019-07-14 DIAGNOSIS — F17210 Nicotine dependence, cigarettes, uncomplicated: Secondary | ICD-10-CM

## 2019-07-14 DIAGNOSIS — M25561 Pain in right knee: Secondary | ICD-10-CM

## 2019-07-14 DIAGNOSIS — G8929 Other chronic pain: Secondary | ICD-10-CM

## 2019-07-14 NOTE — Progress Notes (Signed)
Patient [name], female DOB:09/03/1945, 74 y.o. XKP:[id number]  Chief Complaint  Patient presents with  . Knee Pain    R/hurting alittle today    HPI  [name] is a 74 y.o. female who has chronic knee pain on the right.  She has no giving way, no locking.  She has swelling and popping.  She is active. I have encouraged her to swim this summer.  She is taking her medicine.  She has no new trauma.   Body mass index is 30.21 kg/m.  ROS  Review of Systems  HENT: Negative for congestion.   Respiratory: Positive for shortness of breath. Negative for cough.   Cardiovascular: Negative for chest pain and leg swelling.  Endocrine: Positive for cold intolerance.  Musculoskeletal: Positive for arthralgias, gait problem, joint swelling and myalgias.  Allergic/Immunologic: Positive for environmental allergies.  All other systems reviewed and are negative.   All other systems reviewed and are negative.  The following is a summary of the past history medically, past history surgically, known current medicines, social history and family history.  This information is gathered electronically by the computer from prior information and documentation.  I review this each visit and have found including this information at this point in the chart is beneficial and informative.    Past Medical History:  Diagnosis Date  . Asthma   . Breast cancer ([place]) 2007   Stage I (T1b N0 M0), grade 1 well-differentiated carcinoma of the left breast status, post lumpectomy followed by radiation therapy. Her estrogen receptor receptors were 93%, progesterone receptors 67%. HER-2/neu was negative. No lymphovascular space invasion was seen. All margins were clear. Ki-67 marker was low at 1% with surgery on 11/15/2004. Treated then with post-lumpectomy radiation, finish  . Breast cancer, left breast ([place]) 2007  . COPD (chronic obstructive pulmonary disease) ([place])   . Diabetes mellitus without complication  ([place])   . Diverticula of colon   . Hyperlipidemia   . Hypertension   . Kidney stones   . Nicotine dependence   . Osteoarthritis     Past Surgical History:  Procedure Laterality Date  . ABDOMINAL HYSTERECTOMY    . BREAST SURGERY  2005 approx   left lumpectomy   . CATARACT EXTRACTION W/PHACO Left 05/01/2019   Procedure: CATARACT EXTRACTION PHACO AND INTRAOCULAR LENS PLACEMENT LEFT EYE CDE=11.73;  Surgeon: Baruch Goldmann, MD;  Location: [facility] OR[facility];  Service: Ophthalmology;  Laterality: Left;  left  . CATARACT EXTRACTION W/PHACO Right 05/15/2019   Procedure: CATARACT EXTRACTION PHACO AND INTRAOCULAR LENS PLACEMENT RIGHT EYE;  Surgeon: Baruch Goldmann, MD;  Location: [facility] ORS;  Service: Ophthalmology;  Laterality: Right;  CDE: 16.07  . CHOLECYSTECTOMY N/A 05/26/2014   Procedure: LAPAROSCOPIC CHOLECYSTECTOMY;  Surgeon: Aviva Signs Md, MD;  Location: [facility] ORS;  Service: General;  Laterality: N/A;  . COLECTOMY     2005, diverticulitis  . COLONOSCOPY N/A 05/20/2017   Procedure: COLONOSCOPY;  Surgeon: Danie Binder, MD;  Location: [facility] SU[facility];  Service: Endoscopy;  Laterality: N/A;  1:45pm  . DILATION AND CURETTAGE OF UTERUS    . ESOPHAGOGASTRODUODENOSCOPY N/A 05/20/2017   Procedure: ESOPHAGOGASTRODUODENOSCOPY (EGD);  Surgeon: Danie Binder, MD;  Location: [facility];  Service: Endoscopy;  Laterality: N/A;  . left breast      cancer, in 2006  . TUBAL LIGATION      Family History  Problem Relation Age of Onset  . Breast cancer Mother   . COPD Father   . Emphysema  Father   . Throat cancer Sister   . Glaucoma Brother   . Schizophrenia Brother   . Heart attack Brother   . Lung cancer Sister        former smoker  . Hepatitis C Daughter   . Seizures Daughter   . SIDS Son   . Colon cancer Neg Hx   . Colon polyps Neg Hx     Social History Social History   Tobacco Use  . Smoking status: Current Every Day Smoker    Packs/day: 1.00    Years: 50.00    Pack years: 50.00    Types:  Cigarettes  . Smokeless tobacco: Never Used  . Tobacco comment: cigaretes  now 10 to 15 per day  Vaping Use  . Vaping Use: Never used  Substance Use Topics  . Alcohol use: No    Alcohol/week: 0.0 standard drinks  . Drug use: No    Allergies  Allergen Reactions  . Penicillins Hives, Shortness Of Breath and Swelling    Has patient had a PCN reaction causing immediate rash, facial/tongue/throat swelling, SOB or lightheadedness with hypotension: yes Has patient had a PCN reaction causing severe rash involving mucus membranes or skin necrosis:no Has patient had a PCN reaction that required hospitalization: yes Has patient had a PCN reaction occurring within the last 10 years: no If all of the above answers are "NO", then may proceed with Cephalosporin use.  . Sulfonamide Derivatives Other (See Comments)    Shaking all over, seizure like symptoms. Hospitalization resulted     Current Outpatient Medications  Medication Sig Dispense Refill  . acetaminophen (TYLENOL) 325 MG tablet Take 650 mg by mouth every 6 (six) hours as needed for moderate pain.     Marland Kitchen albuterol (PROVENTIL) (2.5 MG/3ML) 0.083% nebulizer solution INHALE ONE VIAL VIA NEBULIZER THREE TIMES DAILY. (Patient taking differently: Take 2.5 mg by nebulization 3 (three) times daily as needed for wheezing or shortness of breath. ) 300 mL 0  . amLODipine (NORVASC) 10 MG tablet Take 1 tablet (10 mg total) by mouth daily. 90 tablet 3  . aspirin EC 81 MG tablet Take 81 mg by mouth daily.    . ATROVENT HFA 17 MCG/ACT inhaler INHALE 2 PUFFS BY MOUTH FOUR TIMES A DAY. 12.9 g 0  . azelastine (ASTELIN) 0.1 % nasal spray PLACE 2 SPRAYS INTO EACH NOSTRIL TWICE DAILY. 30 mL 5  . benazepril (LOTENSIN) 40 MG tablet TAKE ONE TABLET BY MOUTH DAILY. 90 tablet 0  . Calcium Carbonate-Vitamin D (CALCIUM 600 + D PO) Take 1 tablet by mouth 2 (two) times daily. 600/400    . cyclobenzaprine (FLEXERIL) 5 MG tablet TAKE ONE TABLET BY MOUTH AT BEDTIME AS  NEEDED. 30 tablet 0  . famotidine (PEPCID) 20 MG tablet Take 20 mg by mouth at bedtime.     . fluticasone (FLONASE) 50 MCG/ACT nasal spray Place 2 sprays into both nostrils daily. 16 g 2  . loratadine (CLARITIN) 10 MG tablet TAKE 1 TABLET BY MOUTH ONCE DAILY FOR ALLERGIES. (Patient taking differently: Take 10 mg by mouth daily. TAKE 1 TABLET BY MOUTH ONCE DAILY FOR ALLERGIES.) 30 tablet 4  . montelukast (SINGULAIR) 10 MG tablet TAKE 1 TABLET BY MOUTH AT BEDTIME. (Patient taking differently: Take 10 mg by mouth at bedtime. ) 90 tablet 0  . polyethylene glycol powder (GLYCOLAX/MIRALAX) powder MIX 1 CAPFUL IN 8 OUNCES OF JUICE OR WATER AND DRINK ONCE DAILY. (Patient taking differently: Take 17 g by mouth daily. )  527 g 5  . potassium chloride SA (KLOR-CON) 20 MEQ tablet TAKE 1 TABLET BY MOUTH DAILY. 90 tablet 0  . simvastatin (ZOCOR) 40 MG tablet TAKE (1) TABLET BY MOUTH AT BEDTIME. 30 tablet 0  . SYMBICORT 160-4.5 MCG/ACT inhaler INHALE 2 PUFFS INTO THE LUNGS TWICE DAILY. 10.2 g 0  . tiZANidine (ZANAFLEX) 4 MG tablet TAKE ONE TABLET AT NIGHT BEFORE BED AS NEEDED FOR MUSCLE SPASMS. 30 tablet 0  . traMADol (ULTRAM) 50 MG tablet TAKE (1) TABLET BY MOUTH EVERY SIX HOURS AS NEEDED. 60 tablet 1   No current facility-administered medications for this visit.     Physical Exam  Height '5\' 4"'  (1.626 m), weight 176 lb (79.8 kg).  Constitutional: overall normal hygiene, normal nutrition, well developed, normal grooming, normal body habitus. Assistive device:none  Musculoskeletal: gait and station Limp right, muscle tone and strength are normal, no tremors or atrophy is present.  .  Neurological: coordination overall normal.  Deep tendon reflex/nerve stretch intact.  Sensation normal.  Cranial nerves II-XII intact.   Skin:   Normal overall no scars, lesions, ulcers or rashes. No psoriasis.  Psychiatric: Alert and oriented x 3.  Recent memory intact, remote memory unclear.  Normal mood and affect. Well  groomed.  Good eye contact.  Cardiovascular: overall no swelling, no varicosities, no edema bilaterally, normal temperatures of the legs and arms, no clubbing, cyanosis and good capillary refill.  Lymphatic: palpation is normal.  Right knee is tender.  ROM is 0 to 105.  Limp right.  Stable.  Crepitus and slight effusion are present.  All other systems reviewed and are negative   The patient has been educated about the nature of the problem(s) and counseled on treatment options.  The patient appeared to understand what I have discussed and is in agreement with it.  Encounter Diagnoses  Name Primary?  . Chronic pain of right knee Yes  . Cigarette nicotine dependence without complication     PLAN Call if any problems.  Precautions discussed.  Continue current medications.   Return to clinic 3 months   Electronically Signed Sanjuana Kava, MD 6/29/20211:40 PM

## 2019-07-14 NOTE — Telephone Encounter (Signed)
Patient aware insurance wont cover glucose meter and will look at [facility] for options.

## 2019-07-14 NOTE — Patient Instructions (Signed)

## 2019-07-15 DIAGNOSIS — J343 Hypertrophy of nasal turbinates: Secondary | ICD-10-CM | POA: Diagnosis not present

## 2019-07-15 DIAGNOSIS — J310 Chronic rhinitis: Secondary | ICD-10-CM | POA: Diagnosis not present

## 2019-07-15 DIAGNOSIS — R490 Dysphonia: Secondary | ICD-10-CM | POA: Diagnosis not present

## 2019-07-15 DIAGNOSIS — H9203 Otalgia, bilateral: Secondary | ICD-10-CM | POA: Diagnosis not present

## 2019-07-23 ENCOUNTER — Other Ambulatory Visit: Payer: Self-pay | Admitting: Orthopaedic Surgery

## 2019-07-23 ENCOUNTER — Other Ambulatory Visit: Payer: Self-pay | Admitting: Family Medicine

## 2019-08-19 ENCOUNTER — Other Ambulatory Visit: Payer: Self-pay | Admitting: Family Medicine

## 2019-08-20 ENCOUNTER — Encounter: Payer: Self-pay | Admitting: Family Medicine

## 2019-08-20 ENCOUNTER — Telehealth (INDEPENDENT_AMBULATORY_CARE_PROVIDER_SITE_OTHER): Payer: Medicare Other | Admitting: Family Medicine

## 2019-08-20 ENCOUNTER — Other Ambulatory Visit: Payer: Self-pay

## 2019-08-20 VITALS — BP 156/82 | Ht 64.0 in | Wt 176.0 lb

## 2019-08-20 DIAGNOSIS — J209 Acute bronchitis, unspecified: Secondary | ICD-10-CM | POA: Diagnosis not present

## 2019-08-20 DIAGNOSIS — J42 Unspecified chronic bronchitis: Secondary | ICD-10-CM

## 2019-08-20 DIAGNOSIS — Z7189 Other specified counseling: Secondary | ICD-10-CM | POA: Insufficient documentation

## 2019-08-20 MED ORDER — PREDNISONE 10 MG (21) PO TBPK: ORAL_TABLET | ORAL | 0 refills | Status: DC

## 2019-08-20 MED ORDER — AZITHROMYCIN 250 MG PO TABS: ORAL_TABLET | ORAL | 0 refills | Status: DC

## 2019-08-20 NOTE — Patient Instructions (Signed)
I appreciate the opportunity to provide you with care for your health and wellness. Today we discussed: COPD flare, questionable COVID infection  Follow up: 09/29/2019 as scheduled  No labs or referrals today  It is recommended that you get a COVID 19 test as soon as possible.  Please start medications as directed.  Please continue to practice social distancing to keep you, your family, and our community safe.  If you must go out, please wear a mask and practice good handwashing.  It was a pleasure to see you and I look forward to continuing to work together on your health and well-being. Please do not hesitate to call the office if you need care or have questions about your care.  Have a wonderful day and week. With Gratitude, Cherly Beach, DNP, AGNP-BC

## 2019-08-20 NOTE — Progress Notes (Signed)
Virtual Visit via Telephone Note   This visit type was conducted due to national recommendations for restrictions regarding the COVID-19 Pandemic (e.g. social distancing) in an effort to limit this patient's exposure and mitigate transmission in our community.  Due to her co-morbid illnesses, this patient is at least at moderate risk for complications without adequate follow up.  This format is felt to be most appropriate for this patient at this time.  The patient did not have access to video technology/had technical difficulties with video requiring transitioning to audio format only (telephone).  All issues noted in this document were discussed and addressed.  No physical exam could be performed with this format.    Evaluation Performed:  Follow-up visit  Date:  08/20/2019   ID:  [name], Tanya 10/24/1945, MRN [id number]  Patient Location: Home Provider Location: Office/Clinic  Location of Patient: Home Location of Provider: Telehealth Consent was obtain for visit to be over via telehealth. I verified that I am speaking with the correct person using two identifiers.  PCP:  [name], MD   Chief Complaint: Chronic bronchitis  History of Present Illness:    [name] is a 74 y.o. female with with history of chronic bronchitis, COPD, diabetes, hyperlipidemia among others.  Also with nicotine dependence smoking about half pack a day.  Has been working to try to reduce this.  Reports that about 3 to 5 days ago she started having increased coughing with sputum production yellow-green in nature.  Has a history of having chronic bronchitis requiring multiple treatments throughout the year.  She has been Covid vaccinated.  However she does report that her taste and smell is a little bit diminished compared to what it was.  She chronically keeps a runny nose and postnasal drip. She denies having any chest pain, headaches, fever or chills.  Does have some increased shortness  of breath outside of her baseline but she has a chronic cough that now has increased sputum production.  The patient does not have symptoms concerning for COVID-19 infection (fever, chills, cough, or new shortness of breath).   Past Medical, Surgical, Social History, Allergies, and Medications have been Reviewed.  Past Medical History:  Diagnosis Date  . Asthma   . Breast cancer ([place]) 2007   Stage I (T1b N0 M0), grade 1 well-differentiated carcinoma of the left breast status, post lumpectomy followed by radiation therapy. Her estrogen receptor receptors were 93%, progesterone receptors 67%. HER-2/neu was negative. No lymphovascular space invasion was seen. All margins were clear. Ki-67 marker was low at 1% with surgery on 11/15/2004. Treated then with post-lumpectomy radiation, finish  . Breast cancer, left breast ([place]) 2007  . COPD (chronic obstructive pulmonary disease) ([place])   . Diabetes mellitus without complication ([place])   . Diverticula of colon   . Hyperlipidemia   . Hypertension   . Kidney stones   . Nicotine dependence   . Osteoarthritis    Past Surgical History:  Procedure Laterality Date  . ABDOMINAL HYSTERECTOMY    . BREAST SURGERY  2005 approx   left lumpectomy   . CATARACT EXTRACTION W/PHACO Left 05/01/2019   Procedure: CATARACT EXTRACTION PHACO AND INTRAOCULAR LENS PLACEMENT LEFT EYE CDE=11.73;  Surgeon: Baruch Goldmann, MD;  Location: [facility];  Service: Ophthalmology;  Laterality: Left;  left  . CATARACT EXTRACTION W/PHACO Right 05/15/2019   Procedure: CATARACT EXTRACTION PHACO AND INTRAOCULAR LENS PLACEMENT RIGHT EYE;  Surgeon: Baruch Goldmann, MD;  Location: [facility];  Service: Ophthalmology;  Laterality: Right;  CDE: 16.07  . CHOLECYSTECTOMY N/A 05/26/2014   Procedure: LAPAROSCOPIC CHOLECYSTECTOMY;  Surgeon: Aviva Signs Md, MD;  Location: [facility];  Service: General;  Laterality: N/A;  . COLECTOMY     2005, diverticulitis  . COLONOSCOPY N/A 05/20/2017   Procedure:  COLONOSCOPY;  Surgeon: Danie Binder, MD;  Location: [facility];  Service: Endoscopy;  Laterality: N/A;  1:45pm  . DILATION AND CURETTAGE OF UTERUS    . ESOPHAGOGASTRODUODENOSCOPY N/A 05/20/2017   Procedure: ESOPHAGOGASTRODUODENOSCOPY (EGD);  Surgeon: Danie Binder, MD;  [facility]: [facility];  Service: Endoscopy;  Laterality: N/A;  . left breast      cancer, in 2006  . TUBAL LIGATION       Current Meds  Medication Sig  . acetaminophen (TYLENOL) 325 MG tablet Take 650 mg by mouth every 6 (six) hours as needed for moderate pain.   Marland Kitchen albuterol (PROVENTIL) (2.5 MG/3ML) 0.083% nebulizer solution INHALE ONE VIAL VIA NEBULIZER THREE TIMES DAILY. (Patient taking differently: Take 2.5 mg by nebulization 3 (three) times daily as needed for wheezing or shortness of breath. )  . aspirin EC 81 MG tablet Take 81 mg by mouth daily.  . ATROVENT HFA 17 MCG/ACT inhaler INHALE 2 PUFFS BY MOUTH FOUR TIMES A DAY.  Marland Kitchen azelastine (ASTELIN) 0.1 % nasal spray PLACE 2 SPRAYS INTO EACH NOSTRIL TWICE DAILY.  . benazepril (LOTENSIN) 40 MG tablet TAKE ONE TABLET BY MOUTH DAILY.  . Calcium Carbonate-Vitamin D (CALCIUM 600 + D PO) Take 1 tablet by mouth 2 (two) times daily. 600/400  . cyclobenzaprine (FLEXERIL) 5 MG tablet TAKE ONE TABLET BY MOUTH AT BEDTIME AS NEEDED.  . famotidine (PEPCID) 20 MG tablet Take 20 mg by mouth at bedtime.   . fluticasone (FLONASE) 50 MCG/ACT nasal spray Place 2 sprays into both nostrils daily.  Marland Kitchen loratadine (CLARITIN) 10 MG tablet TAKE 1 TABLET BY MOUTH ONCE DAILY FOR ALLERGIES. (Patient taking differently: Take 10 mg by mouth daily. TAKE 1 TABLET BY MOUTH ONCE DAILY FOR ALLERGIES.)  . montelukast (SINGULAIR) 10 MG tablet TAKE 1 TABLET BY MOUTH AT BEDTIME. (Patient taking differently: Take 10 mg by mouth at bedtime. )  . polyethylene glycol powder (GLYCOLAX/MIRALAX) powder MIX 1 CAPFUL IN 8 OUNCES OF JUICE OR WATER AND DRINK ONCE DAILY. (Patient taking differently: Take 17 g by mouth  daily. )  . potassium chloride SA (KLOR-CON) 20 MEQ tablet TAKE 1 TABLET BY MOUTH DAILY.  . simvastatin (ZOCOR) 40 MG tablet TAKE (1) TABLET BY MOUTH AT BEDTIME.  . SYMBICORT 160-4.5 MCG/ACT inhaler INHALE 2 PUFFS INTO THE LUNGS TWICE DAILY.  Marland Kitchen tiZANidine (ZANAFLEX) 4 MG tablet TAKE ONE TABLET AT NIGHT BEFORE BED AS NEEDED FOR MUSCLE SPASMS.  Marland Kitchen traMADol (ULTRAM) 50 MG tablet TAKE (1) TABLET BY MOUTH EVERY SIX HOURS AS NEEDED.     Allergies:   Penicillins and Sulfonamide derivatives   ROS:   Please see the history of present illness.    All other systems reviewed and are negative.   Labs/Other Tests and Data Reviewed:    Recent Labs: 09/15/2018: B Natriuretic Peptide 42.0 09/25/2018: Hemoglobin 14.0; Platelets 397 01/28/2019: ALT 15 04/28/2019: BUN 16; Creatinine, Ser 0.90; Potassium 4.4; Sodium 141   Recent Lipid Panel Lab Results  Component Value Date/Time   CHOL 188 01/28/2019 09:26 AM   TRIG 101 01/28/2019 09:26 AM   HDL 82 01/28/2019 09:26 AM   CHOLHDL 2.3 01/28/2019 09:26 AM   LDLCALC 87  01/28/2019 09:26 AM    Wt Readings from Last 3 Encounters:  08/20/19 176 lb (79.8 kg)  07/14/19 176 lb (79.8 kg)  06/23/19 178 lb (80.7 kg)     Objective:    Vital Signs:  BP (!) 156/82   Ht _0  (1.626 m)   Wt 176 lb (79.8 kg)   BMI 30.21 kg/m    VITAL SIGNS:  reviewed GEN:  alert and oriented RESPIRATORY:  not shortness of breath noted in conversation  PSYCH:  normal affect and mood  ASSESSMENT & PLAN:    1. Bronchitis, chronic with acute exacerbation (HCC) - predniSONE (STERAPRED UNI-PAK 21 TAB) 10 MG (21) TBPK tablet; Take as directed  Dispense: 21 tablet; Refill: 0 - azithromycin (ZITHROMAX) 250 MG tablet; Take two today and one po day 2-5  Dispense: 6 tablet; Refill: 0  2. Educated about COVID-19 virus infection   Time:   Today, I have spent 10 minutes with the patient with telehealth technology discussing the above problems.     Medication Adjustments/Labs and  Tests Ordered: Current medicines are reviewed at length with the patient today.  Concerns regarding medicines are outlined above.   Tests Ordered: No orders of the defined types were placed in this encounter.   Medication Changes: No orders of the defined types were placed in this encounter.   Disposition:  Follow up 09/29/2019  Signed, Perlie Mayo, NP  08/20/2019 2:36 PM     [facility]

## 2019-08-20 NOTE — Assessment & Plan Note (Signed)
Reports change in taste and smell, increased cough and sputum history of COPD with chronic bronchitis, having acute on chronic with chronic obstruction bronchitis flare will treat at this time but have advised that she needs to go be tested for Covid to make sure that she does not have this has had vaccinations however given the fact that she is lost some taste and smell I think it would be prudent to make sure that she is tested.

## 2019-08-20 NOTE — Assessment & Plan Note (Signed)
3 to 5-day history of increased cough and congestion, mild shortness of breath I requested Covid testing she needs to have [facility] take her to the secondary to not having a vehicle at this time.  Will start treatment to not delay care.

## 2019-08-25 ENCOUNTER — Other Ambulatory Visit: Payer: Self-pay

## 2019-08-25 ENCOUNTER — Ambulatory Visit
Admission: EM | Admit: 2019-08-25 | Discharge: 2019-08-25 | Disposition: A | Discharge status: Home / Self Care | Payer: Medicare Other | Attending: Emergency Medicine | Admitting: Emergency Medicine

## 2019-08-25 ENCOUNTER — Encounter: Payer: Self-pay | Admitting: Emergency Medicine

## 2019-08-25 DIAGNOSIS — Z1152 Encounter for screening for COVID-19: Secondary | ICD-10-CM

## 2019-08-25 DIAGNOSIS — J069 Acute upper respiratory infection, unspecified: Secondary | ICD-10-CM

## 2019-08-25 DIAGNOSIS — R6889 Other general symptoms and signs: Secondary | ICD-10-CM | POA: Diagnosis not present

## 2019-08-25 NOTE — ED Provider Notes (Signed)
[facility]   [id number] 08/25/19 Arrival Time: 8546   CC: COVID symptoms  SUBJECTIVE: History from: patient.  [name] is a 74 y.o. female with history of COPD and on home oxygen presented to the urgent care for complaint of cough, body ache and nasal congestion with mucus for the past 1 to 2 weeks.  Has completed telehealth visit and provider advised her to have Covid testing.  Was prescribed azithromycin.  Denies sick exposure to COVID, flu or strep.  Denies recent travel.  Denies alleviating or aggravating factors.  Denies previous symptoms in the past.   Denies fever, chills, fatigue, sinus pain, rhinorrhea, sore throat, SOB, wheezing, chest pain, nausea, changes in bowel or bladder habits.        ROS: As per HPI.  All other pertinent ROS negative.     Past Medical History:  Diagnosis Date  . Asthma   . Breast cancer ([place]) 2007   Stage I (T1b N0 M0), grade 1 well-differentiated carcinoma of the left breast status, post lumpectomy followed by radiation therapy. Her estrogen receptor receptors were 93%, progesterone receptors 67%. HER-2/neu was negative. No lymphovascular space invasion was seen. All margins were clear. Ki-67 marker was low at 1% with surgery on 11/15/2004. Treated then with post-lumpectomy radiation, finish  . Breast cancer, left breast ([place]) 2007  . COPD (chronic obstructive pulmonary disease) ([place])   . Diabetes mellitus without complication ([place])   . Diverticula of colon   . Hyperlipidemia   . Hypertension   . Kidney stones   . Nicotine dependence   . Osteoarthritis    Past Surgical History:  Procedure Laterality Date  . ABDOMINAL HYSTERECTOMY    . BREAST SURGERY  2005 approx   left lumpectomy   . CATARACT EXTRACTION W/PHACO Left 05/01/2019   Procedure: CATARACT EXTRACTION PHACO AND INTRAOCULAR LENS PLACEMENT LEFT EYE CDE=11.73;  Surgeon: Baruch Goldmann, MD;  Location: [facility];  Service: Ophthalmology;  Laterality: Left;  left  . CATARACT  EXTRACTION W/PHACO Right 05/15/2019   Procedure: CATARACT EXTRACTION PHACO AND INTRAOCULAR LENS PLACEMENT RIGHT EYE;  Surgeon: Baruch Goldmann, MD;  Location: [facility];  Service: Ophthalmology;  Laterality: Right;  CDE: 16.07  . CHOLECYSTECTOMY N/A 05/26/2014   Procedure: LAPAROSCOPIC CHOLECYSTECTOMY;  Surgeon: Aviva Signs Md, MD;  Location: [facility];  Service: General;  Laterality: N/A;  . COLECTOMY     2005, diverticulitis  . COLONOSCOPY N/A 05/20/2017   Procedure: COLONOSCOPY;  Surgeon: Danie Binder, MD;  [facility];  Service: Endoscopy;  Laterality: N/A;  1:45pm  . DILATION AND CURETTAGE OF UTERUS    . ESOPHAGOGASTRODUODENOSCOPY N/A 05/20/2017   Procedure: ESOPHAGOGASTRODUODENOSCOPY (EGD);  Surgeon: Danie Binder, MD;  [facility];  Service: Endoscopy;  Laterality: N/A;  . left breast      cancer, in 2006  . TUBAL LIGATION     Allergies  Allergen Reactions  . Penicillins Hives, Shortness Of Breath and Swelling    Has patient had a PCN reaction causing immediate rash, facial/tongue/throat swelling, SOB or lightheadedness with hypotension: yes Has patient had a PCN reaction causing severe rash involving mucus membranes or skin necrosis:no Has patient had a PCN reaction that required hospitalization: yes Has patient had a PCN reaction occurring within the last 10 years: no If all of the above answers are "NO", then may proceed with Cephalosporin use.  . Sulfonamide Derivatives Other (See Comments)    Shaking all over, seizure like symptoms. Hospitalization resulted  No current facility-administered medications on file prior to encounter.   Current Outpatient Medications on File Prior to Encounter  Medication Sig Dispense Refill  . acetaminophen (TYLENOL) 325 MG tablet Take 650 mg by mouth every 6 (six) hours as needed for moderate pain.     Marland Kitchen albuterol (PROVENTIL) (2.5 MG/3ML) 0.083% nebulizer solution INHALE ONE VIAL VIA NEBULIZER THREE TIMES DAILY. (Patient  taking differently: Take 2.5 mg by nebulization 3 (three) times daily as needed for wheezing or shortness of breath. ) 300 mL 0  . amLODipine (NORVASC) 10 MG tablet Take 1 tablet (10 mg total) by mouth daily. (Patient not taking: Reported on 08/20/2019) 90 tablet 3  . aspirin EC 81 MG tablet Take 81 mg by mouth daily.    . ATROVENT HFA 17 MCG/ACT inhaler INHALE 2 PUFFS BY MOUTH FOUR TIMES A DAY. 12.9 g 0  . azelastine (ASTELIN) 0.1 % nasal spray PLACE 2 SPRAYS INTO EACH NOSTRIL TWICE DAILY. 30 mL 5  . azithromycin (ZITHROMAX) 250 MG tablet Take two today and one po day 2-5 6 tablet 0  . benazepril (LOTENSIN) 40 MG tablet TAKE ONE TABLET BY MOUTH DAILY. 90 tablet 0  . Calcium Carbonate-Vitamin D (CALCIUM 600 + D PO) Take 1 tablet by mouth 2 (two) times daily. 600/400    . cyclobenzaprine (FLEXERIL) 5 MG tablet TAKE ONE TABLET BY MOUTH AT BEDTIME AS NEEDED. 30 tablet 0  . famotidine (PEPCID) 20 MG tablet Take 20 mg by mouth at bedtime.     . fluticasone (FLONASE) 50 MCG/ACT nasal spray Place 2 sprays into both nostrils daily. 16 g 2  . loratadine (CLARITIN) 10 MG tablet TAKE 1 TABLET BY MOUTH ONCE DAILY FOR ALLERGIES. (Patient taking differently: Take 10 mg by mouth daily. TAKE 1 TABLET BY MOUTH ONCE DAILY FOR ALLERGIES.) 30 tablet 4  . montelukast (SINGULAIR) 10 MG tablet TAKE 1 TABLET BY MOUTH AT BEDTIME. (Patient taking differently: Take 10 mg by mouth at bedtime. ) 90 tablet 0  . polyethylene glycol powder (GLYCOLAX/MIRALAX) powder MIX 1 CAPFUL IN 8 OUNCES OF JUICE OR WATER AND DRINK ONCE DAILY. (Patient taking differently: Take 17 g by mouth daily. ) 527 g 5  . potassium chloride SA (KLOR-CON) 20 MEQ tablet TAKE 1 TABLET BY MOUTH DAILY. 90 tablet 0  . predniSONE (STERAPRED UNI-PAK 21 TAB) 10 MG (21) TBPK tablet Take as directed 21 tablet 0  . simvastatin (ZOCOR) 40 MG tablet TAKE (1) TABLET BY MOUTH AT BEDTIME. 30 tablet 0  . SYMBICORT 160-4.5 MCG/ACT inhaler INHALE 2 PUFFS INTO THE LUNGS TWICE  DAILY. 10.2 g 0  . tiZANidine (ZANAFLEX) 4 MG tablet TAKE ONE TABLET AT NIGHT BEFORE BED AS NEEDED FOR MUSCLE SPASMS. 30 tablet 0  . traMADol (ULTRAM) 50 MG tablet TAKE (1) TABLET BY MOUTH EVERY SIX HOURS AS NEEDED. 60 tablet 0   Social History   Socioeconomic History  . Marital status: Single    Spouse name: Not on file  . Number of children: 5  . Years of education: 9th grade   . Highest education level: 9th grade  Occupational History  . Occupation: retired    Fish farm manager: RETIRED  Tobacco Use  . Smoking status: Current Every Day Smoker    Packs/day: 1.00    Years: 50.00    Pack years: 50.00    Types: Cigarettes  . Smokeless tobacco: Never Used  . Tobacco comment: cigaretes  now 10 to 15 per day  Vaping Use  . Vaping  Use: Never used  Substance and Sexual Activity  . Alcohol use: No    Alcohol/week: 0.0 standard drinks  . Drug use: No  . Sexual activity: Not Currently  Other Topics Concern  . 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 QUIT WORKING 2003 AFTER CANCER DIAGNOSIS.      MARRIED WITH A RUN AWAY HUSBAND(FUGITIVE 10 YRS). 3 MISCARRIAGES, 5 CHILDREN   Social Determinants of Health   Financial Resource Strain:   . Difficulty of Paying Living Expenses:   Food Insecurity:   . Worried About Charity fundraiser in the Last Year:   . Arboriculturist in the Last Year:   Transportation Needs:   . Film/video editor (Medical):   Marland Kitchen Lack of Transportation (Non-Medical):   Physical Activity:   . Days of Exercise per Week:   . Minutes of Exercise per Session:   Stress:   . Feeling of Stress :   Social Connections:   . Frequency of Communication with Friends and Family:   . Frequency of Social Gatherings with Friends and Family:   . Attends Religious Services:   . Active Member of Clubs or Organizations:   . Attends Archivist Meetings:   Marland Kitchen Marital Status:   Intimate Partner Violence:   .  Fear of Current or Ex-Partner:   . Emotionally Abused:   Marland Kitchen Physically Abused:   . Sexually Abused:    Family History  Problem Relation Age of Onset  . Breast cancer Mother   . COPD Father   . Emphysema Father   . Throat cancer Sister   . Glaucoma Brother   . Schizophrenia Brother   . Heart attack Brother   . Lung cancer Sister        former smoker  . Hepatitis C Daughter   . Seizures Daughter   . SIDS Son   . Colon cancer Neg Hx   . Colon polyps Neg Hx     OBJECTIVE:  Vitals:   08/25/19 1319 08/25/19 1321  BP:  (!) 163/81  Pulse:  93  Resp:  19  Temp:  98.8 F (37.1 C)  TempSrc:  Oral  SpO2:  (!) 86%  Weight: 174 lb 2.6 oz (79 kg)   Height: '5\' 4"'  (1.626 m)      General appearance: alert; appears fatigued, but nontoxic; speaking in full sentences and tolerating own secretions HEENT: NCAT; Ears: EACs clear, TMs pearly gray; Eyes: PERRL.  EOM grossly intact. Sinuses: nontender; Nose: nares patent without rhinorrhea, Throat: oropharynx clear, tonsils non erythematous or enlarged, uvula midline  Neck: supple without LAD Lungs: unlabored respirations, symmetrical air entry; cough: mild; no respiratory distress; CTAB Heart: regular rate and rhythm.  Radial pulses 2+ symmetrical bilaterally Skin: warm and dry Psychological: alert and cooperative; normal mood and affect  LABS:  No results found for this or any previous visit (from the past 24 hour(s)).   ASSESSMENT & PLAN:  1. URI with cough and congestion   2. Encounter for screening for COVID-19     No orders of the defined types were placed in this encounter.   Discharge Instruction  COVID testing ordered.  It will take between 2-7 days for test results.  Someone will contact you regarding abnormal results.    In the meantime: You should remain isolated in your home for 10 days from symptom onset AND greater than 24 hours after symptoms resolution (absence  of fever without the use of fever-reducing medication  and improvement in respiratory symptoms), whichever is longer Get plenty of rest and push fluids Use OTC medications like ibuprofen or tylenol as needed fever or pain Call or go to the ED if you have any new or worsening symptoms such as fever, worsening cough, shortness of breath, chest tightness, chest pain, turning blue, changes in mental status, etc...   Reviewed expectations re: course of current medical issues. Questions answered. Outlined signs and symptoms indicating need for more acute intervention. Patient verbalized understanding. After Visit Summary given.      Note: This document was prepared using Dragon voice recognition software and may include unintentional dictation errors.    Emerson Monte, FNP 08/25/19 1356

## 2019-08-25 NOTE — Discharge Instructions (Signed)
COVID testing ordered.  It will take between 2-7 days for test results.  Someone will contact you regarding abnormal results.    In the meantime: You should remain isolated in your home for 10 days from symptom onset AND greater than 24 hours after symptoms resolution (absence of fever without the use of fever-reducing medication and improvement in respiratory symptoms), whichever is longer Get plenty of rest and push fluids Use OTC medications like ibuprofen or tylenol as needed fever or pain Call or go to the ED if you have any new or worsening symptoms such as fever, worsening cough, shortness of breath, chest tightness, chest pain, turning blue, changes in mental status, etc..Marland Kitchen

## 2019-08-25 NOTE — ED Triage Notes (Signed)
Cough, muscle pain and mucous x1-2 weeks. Would like covid test.

## 2019-08-27 LAB — NOVEL CORONAVIRUS, NAA: SARS-CoV-2, NAA: NOT DETECTED

## 2019-08-27 LAB — SARS-COV-2, NAA 2 DAY TAT

## 2019-09-17 ENCOUNTER — Other Ambulatory Visit: Payer: Self-pay | Admitting: Family Medicine

## 2019-09-17 ENCOUNTER — Other Ambulatory Visit: Payer: Self-pay

## 2019-09-17 ENCOUNTER — Other Ambulatory Visit: Payer: Self-pay | Admitting: Orthopaedic Surgery

## 2019-09-17 DIAGNOSIS — J3089 Other allergic rhinitis: Secondary | ICD-10-CM

## 2019-09-17 MED ORDER — SIMVASTATIN 40 MG PO TABS: ORAL_TABLET | ORAL | 1 refills | Status: DC

## 2019-09-25 DIAGNOSIS — I10 Essential (primary) hypertension: Secondary | ICD-10-CM | POA: Diagnosis not present

## 2019-09-25 DIAGNOSIS — E559 Vitamin D deficiency, unspecified: Secondary | ICD-10-CM | POA: Diagnosis not present

## 2019-09-25 DIAGNOSIS — E785 Hyperlipidemia, unspecified: Secondary | ICD-10-CM | POA: Diagnosis not present

## 2019-09-26 LAB — COMPLETE METABOLIC PANEL WITH GFR
AG Ratio: 1.7 (calc) (ref 1.0–2.5)
ALT: 11 U/L (ref 6–29)
AST: 13 U/L (ref 10–35)
Albumin: 4.5 g/dL (ref 3.6–5.1)
Alkaline phosphatase (APISO): 69 U/L (ref 37–153)
BUN: 16 mg/dL (ref 7–25)
CO2: 28 mmol/L (ref 20–32)
Calcium: 10.6 mg/dL — ABNORMAL HIGH (ref 8.6–10.4)
Chloride: 103 mmol/L (ref 98–110)
Creat: 0.89 mg/dL (ref 0.60–0.93)
GFR, Est African American: 74 mL/min/{1.73_m2} (ref >=60)
GFR, Est Non African American: 64 mL/min/{1.73_m2} (ref >=60)
Globulin: 2.7 g/dL (calc) (ref 1.9–3.7)
Glucose, Bld: 111 mg/dL — ABNORMAL HIGH (ref 65–99)
Potassium: 4.8 mmol/L (ref 3.5–5.3)
Sodium: 141 mmol/L (ref 135–146)
Total Bilirubin: 0.4 mg/dL (ref 0.2–1.2)
Total Protein: 7.2 g/dL (ref 6.1–8.1)

## 2019-09-26 LAB — LIPID PANEL
Cholesterol: 171 mg/dL (ref <200)
HDL: 74 mg/dL (ref >=50)
LDL Cholesterol (Calc): 74 mg/dL (calc)
Non-HDL Cholesterol (Calc): 97 mg/dL (calc) (ref <130)
Total CHOL/HDL Ratio: 2.3 (calc) (ref <5.0)
Triglycerides: 148 mg/dL (ref <150)

## 2019-09-26 LAB — VITAMIN D 25 HYDROXY (VIT D DEFICIENCY, FRACTURES): Vit D, 25-Hydroxy: 36 ng/mL (ref 30–100)

## 2019-09-26 LAB — TSH: TSH: 1.07 mIU/L (ref 0.40–4.50)

## 2019-09-29 ENCOUNTER — Other Ambulatory Visit: Payer: Self-pay

## 2019-09-29 ENCOUNTER — Encounter: Payer: Self-pay | Admitting: Family Medicine

## 2019-09-29 ENCOUNTER — Encounter: Payer: Medicare Other | Admitting: Family Medicine

## 2019-09-29 ENCOUNTER — Telehealth (INDEPENDENT_AMBULATORY_CARE_PROVIDER_SITE_OTHER): Payer: Medicare Other | Admitting: Family Medicine

## 2019-09-29 VITALS — BP 142/78 | Ht 64.0 in | Wt 179.4 lb

## 2019-09-29 DIAGNOSIS — J069 Acute upper respiratory infection, unspecified: Secondary | ICD-10-CM

## 2019-09-29 DIAGNOSIS — J449 Chronic obstructive pulmonary disease, unspecified: Secondary | ICD-10-CM

## 2019-09-29 DIAGNOSIS — R197 Diarrhea, unspecified: Secondary | ICD-10-CM

## 2019-09-29 MED ORDER — AZITHROMYCIN 250 MG PO TABS: ORAL_TABLET | ORAL | 0 refills | Status: DC

## 2019-09-29 NOTE — Assessment & Plan Note (Signed)
Unfortunately due to chronic smoking she continues to worsen with her contraction of sinus and respiratory infections.  Cessation counseling is performed regularly at each visit.  Use of daily inhalers to help with breathing as discussed.  Referral to pulmonology.

## 2019-09-29 NOTE — Patient Instructions (Addendum)
I appreciate the opportunity to provide you with care for your health and wellness. Today we discussed: Upper respiratory infection with cough and congestion and diarrhea  Follow up: 10/19/2019 as scheduled  No labs or referrals today  Avoid taking MiraLAX for the next several days.   If this does not help ease your diarrhea please get some Imodium over-the-counter.  If you continue to get worsening symptoms develop a fever please go to your nearest urgent care and be tested for Covid.  Please continue to practice social distancing to keep you, your family, and our community safe.  If you must go out, please wear a mask and practice good handwashing.  It was a pleasure to see you and I look forward to continuing to work together on your health and well-being. Please do not hesitate to call the office if you need care or have questions about your care.  Have a wonderful day and week. With Gratitude, Cherly Beach, DNP, AGNP-BC

## 2019-09-29 NOTE — Progress Notes (Signed)
Virtual Visit via Telephone Note   This visit type was conducted due to national recommendations for restrictions regarding the COVID-19 Pandemic (e.g. social distancing) in an effort to limit this patient's exposure and mitigate transmission in our community.  Due to her co-morbid illnesses, this patient is at least at moderate risk for complications without adequate follow up.  This format is felt to be most appropriate for this patient at this time.  The patient did not have access to video technology/had technical difficulties with video requiring transitioning to audio format only (telephone).  All issues noted in this document were discussed and addressed.  No physical exam could be performed with this format.    Evaluation Performed:  Follow-up visit  Date:  09/29/2019   ID:  [name], DOB 03/25/1945, MRN [id number]  Patient Location: Home Provider Location: Office/Clinic  Location of Patient: Home Location of Provider: Telehealth Consent was obtain for visit to be over via telehealth. I verified that I am speaking with the correct person using two identifiers.  PCP:  [name], MD   Chief Complaint: Sinus problems  History of Present Illness:    [name] is a 74 y.o. female with history of COPD and on home oxygen recent treated for bronchitis with acute exacerbation of COPD.  I provided her with Zithromax and prednisone at that time.  She did go get Covid testing it was negative.  Today she reports that she is feeling bad again with a cough that is worse than normal, green mucus production in the morning, no shortness of breath unless talking for long periods of time,, headache, ear ache-Left, nasal congestion, sore throat, body aches, diarrhea.  Denies sick exposure to COVID, flu or strep.  Denies recent travel. Denies alleviating or aggravating factors.  Denies previous symptoms in the past.   Denies fever, chills, fatigue, wheezing, chest pain,  nausea.  She is open to having a referral to a pulmonologist to make sure that we are treating her bronchitis appropriately.  As she chronically is seen throughout the year with several infections.  She is still a current smoker.  She has been Covid vaccinated.  However she does report that her taste and smell is a little bit diminished compared to what it was-but could be that she chronically keeps a runny nose and postnasal drip.  The patient does not have symptoms concerning for COVID-19 infection (fever, chills, cough, or new shortness of breath).   Past Medical, Surgical, Social History, Allergies, and Medications have been Reviewed.  Past Medical History:  Diagnosis Date  . Asthma   . Breast cancer ([place]) 2007   Stage I (T1b N0 M0), grade 1 well-differentiated carcinoma of the left breast status, post lumpectomy followed by radiation therapy. Her estrogen receptor receptors were 93%, progesterone receptors 67%. HER-2/neu was negative. No lymphovascular space invasion was seen. All margins were clear. Ki-67 marker was low at 1% with surgery on 11/15/2004. Treated then with post-lumpectomy radiation, finish  . Breast cancer, left breast ([place]) 2007  . COPD (chronic obstructive pulmonary disease) ([place])   . Diabetes mellitus without complication ([place])   . Diverticula of colon   . Hyperlipidemia   . Hypertension   . Kidney stones   . Nicotine dependence   . Osteoarthritis    Past Surgical History:  Procedure Laterality Date  . ABDOMINAL HYSTERECTOMY    . BREAST SURGERY  2005 approx   left lumpectomy   . CATARACT EXTRACTION  W/PHACO Left 05/01/2019   Procedure: CATARACT EXTRACTION PHACO AND INTRAOCULAR LENS PLACEMENT LEFT EYE CDE=11.73;  Surgeon: Baruch Goldmann, MD;  Location: [facility];  Service: Ophthalmology;  Laterality: Left;  left  . CATARACT EXTRACTION W/PHACO Right 05/15/2019   Procedure: CATARACT EXTRACTION PHACO AND INTRAOCULAR LENS PLACEMENT RIGHT EYE;  Surgeon: Baruch Goldmann,  MD;  Location: [facility];  Service: Ophthalmology;  Laterality: Right;  CDE: 16.07  . CHOLECYSTECTOMY N/A 05/26/2014   Procedure: LAPAROSCOPIC CHOLECYSTECTOMY;  Surgeon: Aviva Signs Md, MD;  Location: [facility];  Service: General;  Laterality: N/A;  . COLECTOMY     2005, diverticulitis  . COLONOSCOPY N/A 05/20/2017   Procedure: COLONOSCOPY;  Surgeon: Danie Binder, MD;  Location: [facility];  Service: Endoscopy;  Laterality: N/A;  1:45pm  . DILATION AND CURETTAGE OF UTERUS    . ESOPHAGOGASTRODUODENOSCOPY N/A 05/20/2017   Procedure: ESOPHAGOGASTRODUODENOSCOPY (EGD);  Surgeon: Danie Binder, MD;  [facility]: [facility];  Service: Endoscopy;  Laterality: N/A;  . left breast      cancer, in 2006  . TUBAL LIGATION       Current Meds  Medication Sig  . acetaminophen (TYLENOL) 325 MG tablet Take 650 mg by mouth every 6 (six) hours as needed for moderate pain.   Marland Kitchen albuterol (PROVENTIL) (2.5 MG/3ML) 0.083% nebulizer solution INHALE ONE VIAL VIA NEBULIZER THREE TIMES DAILY. (Patient taking differently: Take 2.5 mg by nebulization 3 (three) times daily as needed for wheezing or shortness of breath. )  . amLODipine (NORVASC) 10 MG tablet Take 1 tablet (10 mg total) by mouth daily.  Marland Kitchen aspirin EC 81 MG tablet Take 81 mg by mouth daily.  . ATROVENT HFA 17 MCG/ACT inhaler INHALE 2 PUFFS BY MOUTH FOUR TIMES A DAY.  Marland Kitchen azelastine (ASTELIN) 0.1 % nasal spray PLACE 2 SPRAYS INTO EACH NOSTRIL TWICE DAILY.  Marland Kitchen azithromycin (ZITHROMAX) 250 MG tablet Take two today and one po day 2-5  . benazepril (LOTENSIN) 40 MG tablet TAKE ONE TABLET BY MOUTH DAILY.  . Calcium Carbonate-Vitamin D (CALCIUM 600 + D PO) Take 1 tablet by mouth 2 (two) times daily. 600/400  . cyclobenzaprine (FLEXERIL) 5 MG tablet TAKE ONE TABLET BY MOUTH AT BEDTIME AS NEEDED.  . famotidine (PEPCID) 20 MG tablet Take 20 mg by mouth at bedtime.   . fluticasone (FLONASE) 50 MCG/ACT nasal spray USE 2 SPRAYS IN EACH NOSTRIL DAILY.  Marland Kitchen loratadine  (CLARITIN) 10 MG tablet TAKE 1 TABLET BY MOUTH ONCE DAILY FOR ALLERGIES.  Marland Kitchen montelukast (SINGULAIR) 10 MG tablet TAKE 1 TABLET BY MOUTH AT BEDTIME. (Patient taking differently: Take 10 mg by mouth at bedtime. )  . polyethylene glycol powder (GLYCOLAX/MIRALAX) powder MIX 1 CAPFUL IN 8 OUNCES OF JUICE OR WATER AND DRINK ONCE DAILY. (Patient taking differently: Take 17 g by mouth daily. )  . potassium chloride SA (KLOR-CON) 20 MEQ tablet TAKE 1 TABLET BY MOUTH DAILY.  Marland Kitchen predniSONE (STERAPRED UNI-PAK 21 TAB) 10 MG (21) TBPK tablet Take as directed  . simvastatin (ZOCOR) 40 MG tablet TAKE (1) TABLET BY MOUTH AT BEDTIME.  . SYMBICORT 160-4.5 MCG/ACT inhaler INHALE 2 PUFFS INTO THE LUNGS TWICE DAILY.  Marland Kitchen tiZANidine (ZANAFLEX) 4 MG tablet TAKE ONE TABLET AT NIGHT BEFORE BED AS NEEDED FOR MUSCLE SPASMS.  Marland Kitchen traMADol (ULTRAM) 50 MG tablet TAKE (1) TABLET BY MOUTH EVERY SIX HOURS AS NEEDED.     Allergies:   Penicillins and Sulfonamide derivatives   ROS:   Please see the history of  present illness.    All other systems reviewed and are negative.   Labs/Other Tests and Data Reviewed:    Recent Labs: 09/25/2019: ALT 11; BUN 16; Creat 0.89; Potassium 4.8; Sodium 141; TSH 1.07   Recent Lipid Panel Lab Results  Component Value Date/Time   CHOL 171 09/25/2019 09:58 AM   TRIG 148 09/25/2019 09:58 AM   HDL 74 09/25/2019 09:58 AM   CHOLHDL 2.3 09/25/2019 09:58 AM   LDLCALC 74 09/25/2019 09:58 AM    Wt Readings from Last 3 Encounters:  09/29/19 179 lb 6.4 oz (81.4 kg)  08/25/19 174 lb 2.6 oz (79 kg)  08/20/19 176 lb (79.8 kg)     Objective:    Vital Signs:  BP (!) 142/78   Ht '5\' 4"'  (1.626 m)   Wt 179 lb 6.4 oz (81.4 kg)   BMI 30.79 kg/m    VITAL SIGNS:  reviewed GEN:  alert and oriented RESPIRATORY:  not shortness of breath noted in conversation  PSYCH:  normal affect and mood  ASSESSMENT & PLAN:    1. URI with cough and congestion  - azithromycin (ZITHROMAX) 250 MG tablet; Take two  today and one po day 2-5  Dispense: 6 tablet; Refill: 0  2. COPD (chronic obstructive pulmonary disease) with chronic bronchitis (HCC)  - azithromycin (ZITHROMAX) 250 MG tablet; Take two today and one po day 2-5  Dispense: 6 tablet; Refill: 0 -Referral to pulmonology  3. Diarrhea, unspecified type   Time:   Today, I have spent 8 minutes with the patient with telehealth technology discussing the above problems.     Medication Adjustments/Labs and Tests Ordered: Current medicines are reviewed at length with the patient today.  Concerns regarding medicines are outlined above.   Tests Ordered: No orders of the defined types were placed in this encounter.   Medication Changes: No orders of the defined types were placed in this encounter.   Disposition:  Follow up as scheduled Signed, Perlie Mayo, NP  09/29/2019 2:24 PM     [facility]

## 2019-09-29 NOTE — Assessment & Plan Note (Signed)
Zithromax provided secondary to chronic COPD or/with chronic bronchitis.  She is strongly encouraged to stop smoking in addition to get Covid testing if her symptoms worsen or do not improve. Patient acknowledged agreement and understanding of the plan.

## 2019-09-29 NOTE — Assessment & Plan Note (Signed)
Encouraged to not use MiraLAX for the next 3 to 5 days and to get Imodium over-the-counter if needed.

## 2019-09-30 ENCOUNTER — Other Ambulatory Visit (HOSPITAL_COMMUNITY): Payer: Medicare Other

## 2019-10-06 ENCOUNTER — Other Ambulatory Visit (HOSPITAL_COMMUNITY): Payer: Self-pay

## 2019-10-06 ENCOUNTER — Other Ambulatory Visit (HOSPITAL_COMMUNITY): Payer: Medicare Other

## 2019-10-06 DIAGNOSIS — Z170 Estrogen receptor positive status [ER+]: Secondary | ICD-10-CM

## 2019-10-07 ENCOUNTER — Inpatient Hospital Stay (HOSPITAL_COMMUNITY): Payer: Medicare Other | Attending: Hematology

## 2019-10-07 ENCOUNTER — Ambulatory Visit (HOSPITAL_COMMUNITY): Payer: Medicare Other | Admitting: Nurse Practitioner

## 2019-10-07 ENCOUNTER — Other Ambulatory Visit: Payer: Self-pay

## 2019-10-07 ENCOUNTER — Other Ambulatory Visit (HOSPITAL_COMMUNITY): Payer: Medicare Other

## 2019-10-07 DIAGNOSIS — E559 Vitamin D deficiency, unspecified: Secondary | ICD-10-CM | POA: Diagnosis not present

## 2019-10-07 DIAGNOSIS — Z853 Personal history of malignant neoplasm of breast: Secondary | ICD-10-CM | POA: Diagnosis not present

## 2019-10-07 DIAGNOSIS — Z170 Estrogen receptor positive status [ER+]: Secondary | ICD-10-CM

## 2019-10-07 LAB — CBC WITH DIFFERENTIAL/PLATELET
Abs Immature Granulocytes: 0.04 10*3/uL (ref 0.00–0.07)
Basophils Absolute: 0.1 10*3/uL (ref 0.0–0.1)
Basophils Relative: 1 %
Eosinophils Absolute: 0.3 10*3/uL (ref 0.0–0.5)
Eosinophils Relative: 2 %
HCT: 46.9 % — ABNORMAL HIGH (ref 36.0–46.0)
Hemoglobin: 14.5 g/dL (ref 12.0–15.0)
Immature Granulocytes: 0 %
Lymphocytes Relative: 23 %
Lymphs Abs: 2.9 10*3/uL (ref 0.7–4.0)
MCH: 28.7 pg (ref 26.0–34.0)
MCHC: 30.9 g/dL (ref 30.0–36.0)
MCV: 92.9 fL (ref 80.0–100.0)
Monocytes Absolute: 1.2 10*3/uL — ABNORMAL HIGH (ref 0.1–1.0)
Monocytes Relative: 10 %
Neutro Abs: 7.9 10*3/uL — ABNORMAL HIGH (ref 1.7–7.7)
Neutrophils Relative %: 64 %
Platelets: 435 10*3/uL — ABNORMAL HIGH (ref 150–400)
RBC: 5.05 MIL/uL (ref 3.87–5.11)
RDW: 14.6 % (ref 11.5–15.5)
WBC: 12.4 10*3/uL — ABNORMAL HIGH (ref 4.0–10.5)
nRBC: 0 % (ref 0.0–0.2)

## 2019-10-07 LAB — COMPREHENSIVE METABOLIC PANEL
ALT: 15 U/L (ref 0–44)
AST: 17 U/L (ref 15–41)
Albumin: 4.2 g/dL (ref 3.5–5.0)
Alkaline Phosphatase: 68 U/L (ref 38–126)
Anion gap: 12 (ref 5–15)
BUN: 18 mg/dL (ref 8–23)
CO2: 26 mmol/L (ref 22–32)
Calcium: 10.1 mg/dL (ref 8.9–10.3)
Chloride: 101 mmol/L (ref 98–111)
Creatinine, Ser: 0.82 mg/dL (ref 0.44–1.00)
GFR calc Af Amer: >60 mL/min (ref >60)
GFR calc non Af Amer: >60 mL/min (ref >60)
Glucose, Bld: 124 mg/dL — ABNORMAL HIGH (ref 70–99)
Potassium: 4.1 mmol/L (ref 3.5–5.1)
Sodium: 139 mmol/L (ref 135–145)
Total Bilirubin: 0.5 mg/dL (ref 0.3–1.2)
Total Protein: 7.6 g/dL (ref 6.5–8.1)

## 2019-10-07 LAB — VITAMIN D 25 HYDROXY (VIT D DEFICIENCY, FRACTURES): Vit D, 25-Hydroxy: 29.24 ng/mL — ABNORMAL LOW (ref 30–100)

## 2019-10-07 LAB — VITAMIN B12: Vitamin B-12: 157 pg/mL — ABNORMAL LOW (ref 180–914)

## 2019-10-07 LAB — LACTATE DEHYDROGENASE: LDH: 142 U/L (ref 98–192)

## 2019-10-13 ENCOUNTER — Emergency Department (HOSPITAL_COMMUNITY)
Admission: EM | Admit: 2019-10-13 | Discharge: 2019-10-13 | Disposition: A | Discharge status: Home / Self Care | Payer: Medicare Other | Attending: Emergency Medicine | Admitting: Emergency Medicine

## 2019-10-13 ENCOUNTER — Other Ambulatory Visit: Payer: Self-pay

## 2019-10-13 ENCOUNTER — Telehealth: Payer: Medicare Other | Admitting: Internal Medicine

## 2019-10-13 ENCOUNTER — Encounter (HOSPITAL_COMMUNITY): Payer: Self-pay | Admitting: *Deleted

## 2019-10-13 ENCOUNTER — Ambulatory Visit: Payer: Medicare Other | Admitting: Orthopaedic Surgery

## 2019-10-13 DIAGNOSIS — K5900 Constipation, unspecified: Secondary | ICD-10-CM | POA: Diagnosis not present

## 2019-10-13 DIAGNOSIS — R05 Cough: Secondary | ICD-10-CM | POA: Insufficient documentation

## 2019-10-13 DIAGNOSIS — Z5321 Procedure and treatment not carried out due to patient leaving prior to being seen by health care provider: Secondary | ICD-10-CM | POA: Insufficient documentation

## 2019-10-13 DIAGNOSIS — J019 Acute sinusitis, unspecified: Secondary | ICD-10-CM | POA: Diagnosis not present

## 2019-10-13 HISTORY — DX: Personal history of other infectious and parasitic diseases: Z86.19

## 2019-10-13 LAB — URINALYSIS, ROUTINE W REFLEX MICROSCOPIC
Bilirubin Urine: NEGATIVE
Glucose, UA: NEGATIVE mg/dL
Hgb urine dipstick: NEGATIVE
Ketones, ur: NEGATIVE mg/dL
Nitrite: NEGATIVE
Protein, ur: 30 mg/dL — AB
Specific Gravity, Urine: 1.014 (ref 1.005–1.030)
WBC, UA: >50 WBC/hpf — ABNORMAL HIGH (ref 0–5)
pH: 5 (ref 5.0–8.0)

## 2019-10-13 LAB — CBC
HCT: 46.4 % — ABNORMAL HIGH (ref 36.0–46.0)
Hemoglobin: 14.6 g/dL (ref 12.0–15.0)
MCH: 29.2 pg (ref 26.0–34.0)
MCHC: 31.5 g/dL (ref 30.0–36.0)
MCV: 92.8 fL (ref 80.0–100.0)
Platelets: 419 10*3/uL — ABNORMAL HIGH (ref 150–400)
RBC: 5 MIL/uL (ref 3.87–5.11)
RDW: 14.5 % (ref 11.5–15.5)
WBC: 11.3 10*3/uL — ABNORMAL HIGH (ref 4.0–10.5)
nRBC: 0 % (ref 0.0–0.2)

## 2019-10-13 LAB — LIPASE, BLOOD: Lipase: 26 U/L (ref 11–51)

## 2019-10-13 LAB — COMPREHENSIVE METABOLIC PANEL
ALT: 16 U/L (ref 0–44)
AST: 17 U/L (ref 15–41)
Albumin: 4.3 g/dL (ref 3.5–5.0)
Alkaline Phosphatase: 68 U/L (ref 38–126)
Anion gap: 10 (ref 5–15)
BUN: 18 mg/dL (ref 8–23)
CO2: 27 mmol/L (ref 22–32)
Calcium: 9.9 mg/dL (ref 8.9–10.3)
Chloride: 103 mmol/L (ref 98–111)
Creatinine, Ser: 0.74 mg/dL (ref 0.44–1.00)
GFR calc Af Amer: >60 mL/min (ref >60)
GFR calc non Af Amer: >60 mL/min (ref >60)
Glucose, Bld: 107 mg/dL — ABNORMAL HIGH (ref 70–99)
Potassium: 4.1 mmol/L (ref 3.5–5.1)
Sodium: 140 mmol/L (ref 135–145)
Total Bilirubin: 0.7 mg/dL (ref 0.3–1.2)
Total Protein: 7.7 g/dL (ref 6.5–8.1)

## 2019-10-13 NOTE — ED Triage Notes (Signed)
Pt c/o constipation with abdominal pain x 1 week. Pt was placed on Imodium and Zithromax last week by PCP due to c/o coughing/sinus infection.

## 2019-10-13 NOTE — ED Notes (Signed)
ED Registration left note for triage RN that pt left after triage, but without being seen by provider.

## 2019-10-16 ENCOUNTER — Inpatient Hospital Stay (HOSPITAL_COMMUNITY): Payer: Medicare Other | Attending: Nurse Practitioner | Admitting: Nurse Practitioner

## 2019-10-16 ENCOUNTER — Other Ambulatory Visit: Payer: Self-pay

## 2019-10-16 VITALS — BP 156/76 | HR 97 | Temp 97.5°F | Resp 18 | Wt 173.0 lb

## 2019-10-16 DIAGNOSIS — I10 Essential (primary) hypertension: Secondary | ICD-10-CM | POA: Insufficient documentation

## 2019-10-16 DIAGNOSIS — Z923 Personal history of irradiation: Secondary | ICD-10-CM | POA: Insufficient documentation

## 2019-10-16 DIAGNOSIS — Z170 Estrogen receptor positive status [ER+]: Secondary | ICD-10-CM | POA: Diagnosis not present

## 2019-10-16 DIAGNOSIS — Z801 Family history of malignant neoplasm of trachea, bronchus and lung: Secondary | ICD-10-CM | POA: Diagnosis not present

## 2019-10-16 DIAGNOSIS — Z79899 Other long term (current) drug therapy: Secondary | ICD-10-CM | POA: Diagnosis not present

## 2019-10-16 DIAGNOSIS — Z853 Personal history of malignant neoplasm of breast: Secondary | ICD-10-CM | POA: Insufficient documentation

## 2019-10-16 DIAGNOSIS — Z803 Family history of malignant neoplasm of breast: Secondary | ICD-10-CM | POA: Diagnosis not present

## 2019-10-16 DIAGNOSIS — J449 Chronic obstructive pulmonary disease, unspecified: Secondary | ICD-10-CM | POA: Diagnosis not present

## 2019-10-16 DIAGNOSIS — Z87442 Personal history of urinary calculi: Secondary | ICD-10-CM | POA: Diagnosis not present

## 2019-10-16 DIAGNOSIS — C50212 Malignant neoplasm of upper-inner quadrant of left female breast: Secondary | ICD-10-CM | POA: Diagnosis not present

## 2019-10-16 DIAGNOSIS — Z7982 Long term (current) use of aspirin: Secondary | ICD-10-CM | POA: Insufficient documentation

## 2019-10-16 DIAGNOSIS — E119 Type 2 diabetes mellitus without complications: Secondary | ICD-10-CM | POA: Diagnosis not present

## 2019-10-16 DIAGNOSIS — N390 Urinary tract infection, site not specified: Secondary | ICD-10-CM

## 2019-10-16 DIAGNOSIS — Z8249 Family history of ischemic heart disease and other diseases of the circulatory system: Secondary | ICD-10-CM | POA: Diagnosis not present

## 2019-10-16 DIAGNOSIS — Z7951 Long term (current) use of inhaled steroids: Secondary | ICD-10-CM | POA: Diagnosis not present

## 2019-10-16 DIAGNOSIS — Z1231 Encounter for screening mammogram for malignant neoplasm of breast: Secondary | ICD-10-CM

## 2019-10-16 DIAGNOSIS — D72829 Elevated white blood cell count, unspecified: Secondary | ICD-10-CM | POA: Diagnosis not present

## 2019-10-16 DIAGNOSIS — F17210 Nicotine dependence, cigarettes, uncomplicated: Secondary | ICD-10-CM | POA: Diagnosis not present

## 2019-10-16 DIAGNOSIS — E785 Hyperlipidemia, unspecified: Secondary | ICD-10-CM | POA: Insufficient documentation

## 2019-10-16 MED ORDER — CIPROFLOXACIN HCL 500 MG PO TABS: 500.0000 mg | ORAL_TABLET | Freq: Two times a day (BID) | ORAL | 7 refills | Status: DC

## 2019-10-16 NOTE — Progress Notes (Signed)
[place] [address], [place] [address]   CLINIC:  Medical Oncology/Hematology  PCP:  Tanya Helper, [name] [address], [place] [address] [phone]   REASON FOR VISIT: Follow-up for breast [name]   CURRENT THERAPY: Surveillance  BRIEF ONCOLOGIC HISTORY:  Oncology History  Breast [name] ([name])  11/15/2004 Surgery   Lumpectomy    - 02/22/2005 Radiation Therapy     12/11/2004 -  Chemotherapy   Aromasin    Adverse Reaction   Intolerance to Aromasin    - 01/14/2010 Chemotherapy   Femara     [name] STAGING: [name] Staging Breast [name] ([name]) Staging form: Breast, AJCC 7th Edition - Clinical: Stage IA (T1b, N0, cM0) - Signed by Tanya Cancer, PA on 03/11/2012    INTERVAL HISTORY:  [name] 74 y.o. female returns for routine follow-up for breast [name].  Patient reports she is doing well since her last visit.  She denies any new lumps or bumps present.  She denies any new bone pain. Denies any nausea, vomiting, or diarrhea. Denies any new pains. Had not noticed any recent bleeding such as epistaxis, hematuria or hematochezia. Denies recent chest pain on exertion, shortness of breath on minimal exertion, pre-syncopal episodes, or palpitations. Denies any numbness or tingling in hands or feet. Denies any recent fevers, infections, or recent hospitalizations. Patient reports appetite at 100% and energy level at 60%.  She is eating well maintain her weight this time.    REVIEW OF SYSTEMS:  Review of Systems  Respiratory: Positive for cough.   Genitourinary: Positive for dysuria and frequency.   Hematological: Bruises/bleeds easily.  Psychiatric/Behavioral: Positive for sleep disturbance.  All other systems reviewed and are negative.    PAST MEDICAL/SURGICAL HISTORY:  Past Medical History:  Diagnosis Date   Breast [name] ([name]) 2007   Stage I (T1b N0 M0), grade 1 well-differentiated carcinoma of the left breast  status, post lumpectomy followed by radiation therapy. Her estrogen receptor receptors were 93%, progesterone receptors 67%. HER-2/neu was negative. No lymphovascular space invasion was seen. All margins were clear. Ki-67 marker was low at 1% with surgery on 11/15/2004. Treated then with post-lumpectomy radiation, finish   Breast [name], left breast ([name]) 2007   COPD (chronic obstructive pulmonary disease) ([name])    Diabetes mellitus without complication ([name])    Diverticula of colon    Hx of rickettsial disease    Hyperlipidemia    Hypertension    Kidney stones    Nicotine dependence    Osteoarthritis    Past Surgical History:  Procedure Laterality Date   ABDOMINAL HYSTERECTOMY     BREAST SURGERY  2005 approx   left lumpectomy    CATARACT EXTRACTION W/PHACO Left 05/01/2019   Procedure: CATARACT EXTRACTION PHACO AND INTRAOCULAR LENS PLACEMENT LEFT EYE CDE=11.73;  Surgeon: Tanya Goldmann, [name];  Location: [facility];  Service: Ophthalmology;  Laterality: Left;  left   CATARACT EXTRACTION W/PHACO Right 05/15/2019   Procedure: CATARACT EXTRACTION PHACO AND INTRAOCULAR LENS PLACEMENT RIGHT EYE;  Surgeon: Tanya Goldmann, [name];  Location: [facility];  Service: Ophthalmology;  Laterality: Right;  CDE: 16.07   CHOLECYSTECTOMY N/A 05/26/2014   Procedure: LAPAROSCOPIC CHOLECYSTECTOMY;  Surgeon: Tanya Signs Md, [name];  Location: [facility];  Service: General;  Laterality: N/A;   COLECTOMY     2005, diverticulitis   COLONOSCOPY N/A 05/20/2017   Procedure: COLONOSCOPY;  Surgeon: Tanya Binder, [name];  Location: [facility];  Service: Endoscopy;  Laterality: N/A;  1:45pm   DILATION AND CURETTAGE OF UTERUS     ESOPHAGOGASTRODUODENOSCOPY N/A 05/20/2017   Procedure: ESOPHAGOGASTRODUODENOSCOPY (EGD);  Surgeon: Tanya Binder, [name];  [facility];  Service: Endoscopy;  Laterality: N/A;   left breast      [name], in 2006   TUBAL LIGATION       SOCIAL HISTORY:  Social History   Socioeconomic  History   Marital status: Single    Spouse name: Not on file   Number of children: 5   Years of education: 9th grade    Highest education level: 9th grade  Occupational History   Occupation: retired    Fish farm manager: RETIRED  Tobacco Use   Smoking status: Current Every Day Smoker    Packs/day: 1.00    Years: 50.00    Pack years: 50.00    Types: Cigarettes   Smokeless tobacco: Never Used  Scientific laboratory technician Use: Never used  Substance and Sexual Activity   Alcohol use: No    Alcohol/week: 0.0 standard drinks   Drug use: No   Sexual activity: Not Currently  Other Topics Concern   Not on file  Social History Narrative   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 QUIT WORKING 2003 AFTER [name] DIAGNOSIS.      MARRIED WITH A RUN AWAY HUSBAND(FUGITIVE 10 YRS). 3 MISCARRIAGES, 5 CHILDREN   Social Determinants of Health   Financial Resource Strain:    Difficulty of Paying Living Expenses: Not on file  Food Insecurity:    Worried About [place] in the Last Year: Not on file   Ran Out of Food in the Last Year: Not on file  Transportation Needs:    Lack of Transportation (Medical): Not on file   Lack of Transportation (Non-Medical): Not on file  Physical Activity:    Days of Exercise per Week: Not on file   Minutes of Exercise per Session: Not on file  Stress:    Feeling of Stress : Not on file  Social Connections:    Frequency of Communication with Friends and Family: Not on file   Frequency of Social Gatherings with Friends and Family: Not on file   Attends Religious Services: Not on file   Active Member of Clubs or Organizations: Not on file   Attends Archivist Meetings: Not on file   Marital Status: Not on file  Intimate Partner Violence:    Fear of Current or Ex-Partner: Not on file   Emotionally Abused: Not on file   Physically Abused: Not on file   Sexually Abused: Not on file    FAMILY HISTORY:   Family History  Problem Relation Age of Onset   Breast [name] Mother    COPD Father    Emphysema Father    Throat [name] Sister    Glaucoma Brother    Schizophrenia Brother    Heart attack Brother    Lung [name] Sister        former smoker   Hepatitis C Daughter    Seizures Daughter    SIDS Son    Colon [name] Neg Hx    Colon polyps Neg Hx     CURRENT MEDICATIONS:  Outpatient Encounter Medications as of 10/16/2019  Medication Sig   acetaminophen (TYLENOL) 325 MG tablet Take 650 mg by mouth every 6 (six) hours as needed for moderate pain.    albuterol (PROVENTIL) (2.5 MG/3ML) 0.083% nebulizer solution INHALE ONE VIAL VIA NEBULIZER THREE TIMES DAILY. (  Patient taking differently: Take 2.5 mg by nebulization 3 (three) times daily as needed for wheezing or shortness of breath. )   amLODipine (NORVASC) 10 MG tablet Take 1 tablet (10 mg total) by mouth daily.   aspirin EC 81 MG tablet Take 81 mg by mouth daily.   ATROVENT HFA 17 MCG/ACT inhaler INHALE 2 PUFFS BY MOUTH FOUR TIMES A DAY.   azithromycin (ZITHROMAX) 250 MG tablet Take two today and one po day 2-5   benazepril (LOTENSIN) 40 MG tablet TAKE ONE TABLET BY MOUTH DAILY.   Calcium Carbonate-Vitamin D (CALCIUM 600 + D PO) Take 1 tablet by mouth 2 (two) times daily. 600/400   cyclobenzaprine (FLEXERIL) 5 MG tablet TAKE ONE TABLET BY MOUTH AT BEDTIME AS NEEDED.   famotidine (PEPCID) 20 MG tablet Take 20 mg by mouth at bedtime.    fluticasone (FLONASE) 50 MCG/ACT nasal spray USE 2 SPRAYS IN EACH NOSTRIL DAILY.   loratadine (CLARITIN) 10 MG tablet TAKE 1 TABLET BY MOUTH ONCE DAILY FOR ALLERGIES.   montelukast (SINGULAIR) 10 MG tablet TAKE 1 TABLET BY MOUTH AT BEDTIME. (Patient taking differently: Take 10 mg by mouth at bedtime. )   polyethylene glycol powder (GLYCOLAX/MIRALAX) powder MIX 1 CAPFUL IN 8 OUNCES OF JUICE OR WATER AND DRINK ONCE DAILY. (Patient taking differently: Take 17 g by mouth daily. )    potassium chloride SA (KLOR-CON) 20 MEQ tablet TAKE 1 TABLET BY MOUTH DAILY.   simvastatin (ZOCOR) 40 MG tablet TAKE (1) TABLET BY MOUTH AT BEDTIME.   SYMBICORT 160-4.5 MCG/ACT inhaler INHALE 2 PUFFS INTO THE LUNGS TWICE DAILY.   tiZANidine (ZANAFLEX) 4 MG tablet TAKE ONE TABLET AT NIGHT BEFORE BED AS NEEDED FOR MUSCLE SPASMS.   traMADol (ULTRAM) 50 MG tablet TAKE (1) TABLET BY MOUTH EVERY SIX HOURS AS NEEDED.   [DISCONTINUED] azelastine (ASTELIN) 0.1 % nasal spray PLACE 2 SPRAYS INTO EACH NOSTRIL TWICE DAILY.   [DISCONTINUED] predniSONE (STERAPRED UNI-PAK 21 TAB) 10 MG (21) TBPK tablet Take as directed   ciprofloxacin (CIPRO) 500 MG tablet Take 1 tablet (500 mg total) by mouth 2 (two) times daily.   No facility-administered encounter medications on file as of 10/16/2019.    ALLERGIES:  Allergies  Allergen Reactions   Penicillins Hives, Shortness Of Breath and Swelling    Has patient had a PCN reaction causing immediate rash, facial/tongue/throat swelling, SOB or lightheadedness with hypotension: yes Has patient had a PCN reaction causing severe rash involving mucus membranes or skin necrosis:no Has patient had a PCN reaction that required hospitalization: yes Has patient had a PCN reaction occurring within the last 10 years: no If all of the above answers are "NO", then may proceed with Cephalosporin use.   Sulfonamide Derivatives Other (See Comments)    Shaking all over, seizure like symptoms. Hospitalization resulted      PHYSICAL EXAM:  ECOG Performance status: 1  Vitals:   10/16/19 1021  BP: (!) 156/76  Pulse: 97  Resp: 18  Temp: (!) 97.5 F (36.4 C)  SpO2: 96%   Filed Weights   10/16/19 1021  Weight: 173 lb (78.5 kg)   Physical Exam Constitutional:      Appearance: She is obese.  Cardiovascular:     Rate and Rhythm: Normal rate and regular rhythm.     Heart sounds: Normal heart sounds.  Pulmonary:     Effort: Pulmonary effort is normal.     Breath  sounds: Wheezing present.  Abdominal:     General: Bowel  sounds are normal.     Palpations: Abdomen is soft.  Musculoskeletal:        General: Normal range of motion.  Skin:    General: Skin is warm.  Neurological:     Mental Status: She is alert and oriented to person, place, and time. Mental status is at baseline.  Psychiatric:        Mood and Affect: Mood normal.        Behavior: Behavior normal.        Thought Content: Thought content normal.        Judgment: Judgment normal.      LABORATORY DATA:  I have reviewed the labs as listed.  CBC    Component Value Date/Time   WBC 11.3 (H) 10/13/2019 1252   RBC 5.00 10/13/2019 1252   HGB 14.6 10/13/2019 1252   HCT 46.4 (H) 10/13/2019 1252   PLT 419 (H) 10/13/2019 1252   MCV 92.8 10/13/2019 1252   MCH 29.2 10/13/2019 1252   MCHC 31.5 10/13/2019 1252   RDW 14.5 10/13/2019 1252   LYMPHSABS 2.9 10/07/2019 1055   MONOABS 1.2 (H) 10/07/2019 1055   EOSABS 0.3 10/07/2019 1055   BASOSABS 0.1 10/07/2019 1055   CMP Latest Ref Rng & Units 10/13/2019 10/07/2019 09/25/2019  Glucose 70 - 99 mg/dL 107(H) 124(H) 111(H)  BUN 8 - 23 mg/dL '18 18 16  ' Creatinine 0.44 - 1.00 mg/dL 0.74 0.82 0.89  Sodium 135 - 145 mmol/L 140 139 141  Potassium 3.5 - 5.1 mmol/L 4.1 4.1 4.8  Chloride 98 - 111 mmol/L 103 101 103  CO2 22 - 32 mmol/L '27 26 28  ' Calcium 8.9 - 10.3 mg/dL 9.9 10.1 10.6(H)  Total Protein 6.5 - 8.1 g/dL 7.7 7.6 7.2  Total Bilirubin 0.3 - 1.2 mg/dL 0.7 0.5 0.4  Alkaline Phos 38 - 126 U/L 68 68 -  AST 15 - 41 U/L '17 17 13  ' ALT 0 - 44 U/L '16 15 11    ' DIAGNOSTIC IMAGING:  I have independently reviewed the mammogram scans and discussed with the patient.  ASSESSMENT & PLAN:  Breast [name] 1.  Stage Ia invasive ductal carcinoma the left breast: -Patient was diagnosed in 2006.  She had a lumpectomy on 11/15/2004 which showed invasive ductal carcinoma; ER+/PR+/HER-2-.  Ki 67 1%. -Her lumpectomy was followed by adjuvant radiation therapy  which she completed 02/22/2005. -She started antiestrogen therapy with Aromasin on 11/2004, she switched to Femara due to intolerance.  She completed a total of 5 years of antiestrogen therapy on 01/14/2010. - Patient's last screening mammogram was done on 05/29/2018 which was BI-RADS Category 1 benign. -Labs done on 10/13/2019 showed WBC 11.3, hemoglobin 14.6, platelets 419 -Patient reports she has currently been battling with bronchitis for the past few weeks.  She is on antibiotics and steroids at this time and follows up with her PCP. -She is continuing to smoke daily. -She also reports symptoms of UTI.  And is requesting more antibiotics. - She will return in 1 year with repeat mammo-and labs.  Patient wants to continue seeing [place] yearly even though she is more than 15 years out from her breast [name].  2.  Leukocytosis: -Patient has smoking induced leukocytosis. -She has documented leukocytosis since 2009. -She also takes antibiotics for different infections monthly.       Orders placed this encounter:  Orders Placed This Encounter  Procedures   MM 3D SCREEN BREAST BILATERAL   Lactate dehydrogenase   Vitamin B12  VITAMIN D 25 Hydroxy (Vit-D Deficiency, Fractures)   CBC with Differential/Platelet   Comprehensive metabolic panel      Tanya Finders, FNP-C [place] [phone]

## 2019-10-16 NOTE — Assessment & Plan Note (Signed)
1.  Stage Ia invasive ductal carcinoma the left breast: -Patient was diagnosed in 2006.  She had a lumpectomy on 11/15/2004 which showed invasive ductal carcinoma; ER+/PR+/HER-2-.  Ki 67 1%. -Her lumpectomy was followed by adjuvant radiation therapy which she completed 02/22/2005. -She started antiestrogen therapy with Aromasin on 11/2004, she switched to Femara due to intolerance.  She completed a total of 5 years of antiestrogen therapy on 01/14/2010. - Patient's last screening mammogram was done on 05/29/2018 which was BI-RADS Category 1 benign. -Labs done on 10/13/2019 showed WBC 11.3, hemoglobin 14.6, platelets 419 -Patient reports she has currently been battling with bronchitis for the past few weeks.  She is on antibiotics and steroids at this time and follows up with her PCP. -She is continuing to smoke daily. -She also reports symptoms of UTI.  And is requesting more antibiotics. - She will return in 1 year with repeat mammo-and labs.  Patient wants to continue seeing [place] yearly even though she is more than 15 years out from her breast cancer.  2.  Leukocytosis: -Patient has smoking induced leukocytosis. -She has documented leukocytosis since 2009. -She also takes antibiotics for different infections monthly.

## 2019-10-19 ENCOUNTER — Encounter: Payer: Medicare Other | Admitting: Family Medicine

## 2019-10-19 ENCOUNTER — Other Ambulatory Visit: Payer: Self-pay | Admitting: Family Medicine

## 2019-10-19 DIAGNOSIS — J3089 Other allergic rhinitis: Secondary | ICD-10-CM

## 2019-10-20 ENCOUNTER — Telehealth: Payer: Self-pay | Admitting: Orthopaedic Surgery

## 2019-10-20 NOTE — Telephone Encounter (Signed)
Patient called to relay she was unable to come to appointment due to illness. Offered re-schedule; elects to call back after some of her other medical appointments.

## 2019-10-30 ENCOUNTER — Other Ambulatory Visit: Payer: Self-pay | Admitting: Orthopaedic Surgery

## 2019-11-16 ENCOUNTER — Ambulatory Visit (INDEPENDENT_AMBULATORY_CARE_PROVIDER_SITE_OTHER): Payer: Medicare Other | Admitting: Internal Medicine

## 2019-11-16 ENCOUNTER — Other Ambulatory Visit: Payer: Self-pay

## 2019-11-16 ENCOUNTER — Encounter: Payer: Self-pay | Admitting: Internal Medicine

## 2019-11-16 VITALS — BP 149/84 | HR 92 | Ht 65.0 in | Wt 174.0 lb

## 2019-11-16 DIAGNOSIS — M8580 Other specified disorders of bone density and structure, unspecified site: Secondary | ICD-10-CM

## 2019-11-16 DIAGNOSIS — Z0000 Encounter for general adult medical examination without abnormal findings: Secondary | ICD-10-CM

## 2019-11-16 DIAGNOSIS — I10 Essential (primary) hypertension: Secondary | ICD-10-CM

## 2019-11-16 DIAGNOSIS — J019 Acute sinusitis, unspecified: Secondary | ICD-10-CM

## 2019-11-16 DIAGNOSIS — Z122 Encounter for screening for malignant neoplasm of respiratory organs: Secondary | ICD-10-CM | POA: Diagnosis not present

## 2019-11-16 MED ORDER — BISOPROLOL FUMARATE 5 MG PO TABS: 5.0000 mg | ORAL_TABLET | Freq: Every day | ORAL | 3 refills | Status: DC

## 2019-11-16 MED ORDER — LEVOFLOXACIN 500 MG PO TABS: 500.0000 mg | ORAL_TABLET | Freq: Every day | ORAL | 0 refills | Status: DC

## 2019-11-16 NOTE — Patient Instructions (Addendum)
Please stop taking Amlodipine. Please start taking Zebeta as prescribed.  Please take Levofloxacin for sinusitis, complete the course even if you start to feel better.  You are advised to quit smoking as soon as possible.  You are being scheduled for DEXA scan and CT chest.

## 2019-11-16 NOTE — Progress Notes (Signed)
Established Patient Office Visit  Subjective:  Patient ID: [name], female    DOB: 02/21/1945  Age: 74 y.o. MRN: [id number]  CC:  Chief Complaint  Patient presents with  . Annual Exam    HPI [name] is a 74 year old female with past medical history of COPD, hypertension, osteopenia and breast cancer s/p left sided mastectomy presents for annual physical exam.  Patient has been having maxillary sinus pressure on both sides for more than a week.  Patient uses Flonase for her allergic rhinitis.  Currently she complains of chills sometimes, but denies fever.  She denies change in taste sensation.  She denies any known exposure to Covid positive person.  Patient also mentions noticing red-colored patches on her upper extremities after starting the medications for her blood pressure -amlodipine and benazepril.  Patient denies any injury or other inciting event related to the rash.  Patient had last DEXA scan in 2015.  Last CT chest for lung cancer screening in 09/2017.  Past Medical History:  Diagnosis Date  . Breast cancer ([place]) 2007   Stage I (T1b N0 M0), grade 1 well-differentiated carcinoma of the left breast status, post lumpectomy followed by radiation therapy. Her estrogen receptor receptors were 93%, progesterone receptors 67%. HER-2/neu was negative. No lymphovascular space invasion was seen. All margins were clear. Ki-67 marker was low at 1% with surgery on 11/15/2004. Treated then with post-lumpectomy radiation, finish  . Breast cancer, left breast ([place]) 2007  . COPD (chronic obstructive pulmonary disease) ([place])   . Diabetes mellitus without complication ([place])   . Diverticula of colon   . Hx of rickettsial disease   . Hyperlipidemia   . Hypertension   . Kidney stones   . Nicotine dependence   . Osteoarthritis     Past Surgical History:  Procedure Laterality Date  . ABDOMINAL HYSTERECTOMY    . BREAST SURGERY  2005 approx   left lumpectomy   . CATARACT  EXTRACTION W/PHACO Left 05/01/2019   Procedure: CATARACT EXTRACTION PHACO AND INTRAOCULAR LENS PLACEMENT LEFT EYE CDE=11.73;  Surgeon: Baruch Goldmann, MD;  Location: [facility];  Service: Ophthalmology;  Laterality: Left;  left  . CATARACT EXTRACTION W/PHACO Right 05/15/2019   Procedure: CATARACT EXTRACTION PHACO AND INTRAOCULAR LENS PLACEMENT RIGHT EYE;  Surgeon: Baruch Goldmann, MD;  Location: [facility];  Service: Ophthalmology;  Laterality: Right;  CDE: 16.07  . CHOLECYSTECTOMY N/A 05/26/2014   Procedure: LAPAROSCOPIC CHOLECYSTECTOMY;  Surgeon: Aviva Signs Md, MD;  Location: [facility];  Service: General;  Laterality: N/A;  . COLECTOMY     2005, diverticulitis  . COLONOSCOPY N/A 05/20/2017   Procedure: COLONOSCOPY;  Surgeon: Danie Binder, MD;  Location: [facility];  Service: Endoscopy;  Laterality: N/A;  1:45pm  . DILATION AND CURETTAGE OF UTERUS    . ESOPHAGOGASTRODUODENOSCOPY N/A 05/20/2017   Procedure: ESOPHAGOGASTRODUODENOSCOPY (EGD);  Surgeon: Danie Binder, MD;  Location: [facility];  Service: Endoscopy;  Laterality: N/A;  . left breast      cancer, in 2006  . TUBAL LIGATION      Family History  Problem Relation Age of Onset  . Breast cancer Mother   . COPD Father   . Emphysema Father   . Throat cancer Sister   . Glaucoma Brother   . Schizophrenia Brother   . Heart attack Brother   . Lung cancer Sister        former smoker  . Hepatitis C Daughter   . Seizures Daughter   .  SIDS Son   . Colon cancer Neg Hx   . Colon polyps Neg Hx     Social History   Socioeconomic History  . Marital status: Single    Spouse name: Not on file  . Number of children: 5  . Years of education: 9th grade   . Highest education level: 9th grade  Occupational History  . Occupation: retired    Fish farm manager: RETIRED  Tobacco Use  . Smoking status: Current Every Day Smoker    Packs/day: 1.00    Years: 50.00    Pack years: 50.00    Types: Cigarettes  . Smokeless tobacco: Never Used  Vaping Use   . Vaping Use: Never used  Substance and Sexual Activity  . Alcohol use: No    Alcohol/week: 0.0 standard drinks  . Drug use: No  . Sexual activity: Not Currently  Other Topics Concern  . 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 QUIT WORKING 2003 AFTER CANCER DIAGNOSIS.      MARRIED WITH A RUN AWAY HUSBAND(FUGITIVE 10 YRS). 3 MISCARRIAGES, 5 CHILDREN   Social Determinants of Health   Financial Resource Strain:   . Difficulty of Paying Living Expenses: Not on file  Food Insecurity:   . Worried About Charity fundraiser in the Last Year: Not on file  . Ran Out of Food in the Last Year: Not on file  Transportation Needs:   . Lack of Transportation (Medical): Not on file  . Lack of Transportation (Non-Medical): Not on file  Physical Activity:   . Days of Exercise per Week: Not on file  . Minutes of Exercise per Session: Not on file  Stress:   . Feeling of Stress : Not on file  Social Connections:   . Frequency of Communication with Friends and Family: Not on file  . Frequency of Social Gatherings with Friends and Family: Not on file  . Attends Religious Services: Not on file  . Active Member of Clubs or Organizations: Not on file  . Attends Archivist Meetings: Not on file  . Marital Status: Not on file  Intimate Partner Violence:   . Fear of Current or Ex-Partner: Not on file  . Emotionally Abused: Not on file  . Physically Abused: Not on file  . Sexually Abused: Not on file    Outpatient Medications Prior to Visit  Medication Sig Dispense Refill  . acetaminophen (TYLENOL) 325 MG tablet Take 650 mg by mouth every 6 (six) hours as needed for moderate pain.     Marland Kitchen albuterol (PROVENTIL) (2.5 MG/3ML) 0.083% nebulizer solution INHALE ONE VIAL VIA NEBULIZER THREE TIMES DAILY. (Patient taking differently: Take 2.5 mg by nebulization 3 (three) times daily as needed for wheezing or shortness of breath. ) 300 mL  0  . aspirin EC 81 MG tablet Take 81 mg by mouth daily.    . ATROVENT HFA 17 MCG/ACT inhaler INHALE 2 PUFFS BY MOUTH FOUR TIMES A DAY. 12.9 g 0  . benazepril (LOTENSIN) 40 MG tablet TAKE ONE TABLET BY MOUTH DAILY. 90 tablet 0  . Calcium Carbonate-Vitamin D (CALCIUM 600 + D PO) Take 1 tablet by mouth 2 (two) times daily. 600/400    . cyclobenzaprine (FLEXERIL) 5 MG tablet TAKE ONE TABLET BY MOUTH AT BEDTIME AS NEEDED. 30 tablet 0  . famotidine (PEPCID) 20 MG tablet Take 20 mg by mouth at bedtime.     . fluticasone (FLONASE) 50 MCG/ACT  nasal spray USE 2 SPRAYS IN EACH NOSTRIL DAILY. 16 g 0  . loratadine (CLARITIN) 10 MG tablet TAKE 1 TABLET BY MOUTH ONCE DAILY FOR ALLERGIES. 30 tablet 0  . montelukast (SINGULAIR) 10 MG tablet TAKE 1 TABLET BY MOUTH AT BEDTIME. (Patient taking differently: Take 10 mg by mouth at bedtime. ) 90 tablet 0  . polyethylene glycol powder (GLYCOLAX/MIRALAX) powder MIX 1 CAPFUL IN 8 OUNCES OF JUICE OR WATER AND DRINK ONCE DAILY. (Patient taking differently: Take 17 g by mouth daily. ) 527 g 5  . potassium chloride SA (KLOR-CON) 20 MEQ tablet TAKE 1 TABLET BY MOUTH DAILY. 90 tablet 0  . simvastatin (ZOCOR) 40 MG tablet TAKE (1) TABLET BY MOUTH AT BEDTIME. 90 tablet 1  . SYMBICORT 160-4.5 MCG/ACT inhaler INHALE 2 PUFFS INTO THE LUNGS TWICE DAILY. 10.2 g 0  . tiZANidine (ZANAFLEX) 4 MG tablet TAKE ONE TABLET AT NIGHT BEFORE BED AS NEEDED FOR MUSCLE SPASMS. 30 tablet 0  . traMADol (ULTRAM) 50 MG tablet TAKE (1) TABLET BY MOUTH EVERY SIX HOURS AS NEEDED. 60 tablet 0  . amLODipine (NORVASC) 10 MG tablet Take 1 tablet (10 mg total) by mouth daily. 90 tablet 3  . azithromycin (ZITHROMAX) 250 MG tablet Take two today and one po day 2-5 6 tablet 0  . ciprofloxacin (CIPRO) 500 MG tablet Take 1 tablet (500 mg total) by mouth 2 (two) times daily. 14 tablet 7   No facility-administered medications prior to visit.    Allergies  Allergen Reactions  . Penicillins Hives, Shortness Of  Breath and Swelling    Has patient had a PCN reaction causing immediate rash, facial/tongue/throat swelling, SOB or lightheadedness with hypotension: yes Has patient had a PCN reaction causing severe rash involving mucus membranes or skin necrosis:no Has patient had a PCN reaction that required hospitalization: yes Has patient had a PCN reaction occurring within the last 10 years: no If all of the above answers are "NO", then may proceed with Cephalosporin use.  . Sulfonamide Derivatives Other (See Comments)    Shaking all over, seizure like symptoms. Hospitalization resulted     ROS Review of Systems  Constitutional: Positive for chills. Negative for fever.  HENT: Positive for sinus pressure and sinus pain. Negative for congestion, ear discharge, rhinorrhea and sore throat.   Eyes: Negative for pain and discharge.  Respiratory: Positive for cough (chronic). Negative for shortness of breath.   Cardiovascular: Negative for chest pain and palpitations.  Gastrointestinal: Negative for abdominal pain, constipation, diarrhea, nausea and vomiting.  Endocrine: Negative for polydipsia and polyuria.  Genitourinary: Negative for dysuria and hematuria.  Musculoskeletal: Negative for neck pain and neck stiffness.  Skin: Positive for rash.  Neurological: Negative for dizziness and weakness.  Psychiatric/Behavioral: Negative for agitation and behavioral problems.      Objective:    Physical Exam Vitals reviewed.  Constitutional:      General: She is not in acute distress.    Appearance: She is not diaphoretic.  HENT:     Head: Normocephalic and atraumatic.     Nose: Congestion present.     Mouth/Throat:     Mouth: Mucous membranes are moist.     Pharynx: No posterior oropharyngeal erythema.  Eyes:     General: No scleral icterus.    Extraocular Movements: Extraocular movements intact.     Pupils: Pupils are equal, round, and reactive to light.  Cardiovascular:     Rate and Rhythm:  Regular rhythm. Tachycardia present.  Heart sounds: Normal heart sounds. No murmur heard.   Pulmonary:     Breath sounds: Normal breath sounds. No wheezing or rales.  Abdominal:     Palpations: Abdomen is soft.     Tenderness: There is no abdominal tenderness.  Musculoskeletal:     Cervical back: Neck supple. No rigidity or tenderness.     Right lower leg: No edema.     Left lower leg: No edema.  Skin:    General: Skin is warm.     Findings: Rash (purpura like rash on b/l forearms) present.  Neurological:     General: No focal deficit present.     Mental Status: She is alert and oriented to person, place, and time.     Sensory: No sensory deficit.     Motor: No weakness.  Psychiatric:        Mood and Affect: Mood normal.        Behavior: Behavior normal.     BP (!) 158/80 (BP Location: Right Arm, Patient Position: Sitting, Cuff Size: Large)   Pulse 92   Ht _0  (1.651 m)   Wt 174 lb (78.9 kg)   SpO2 96%   BMI 28.96 kg/m  Wt Readings from Last 3 Encounters:  11/16/19 174 lb (78.9 kg)  10/16/19 173 lb (78.5 kg)  09/29/19 179 lb 6.4 oz (81.4 kg)     There are no preventive care reminders to display for this patient.  There are no preventive care reminders to display for this patient.  Lab Results  Component Value Date   TSH 1.07 09/25/2019   Lab Results  Component Value Date   WBC 11.3 (H) 10/13/2019   HGB 14.6 10/13/2019   HCT 46.4 (H) 10/13/2019   MCV 92.8 10/13/2019   PLT 419 (H) 10/13/2019   Lab Results  Component Value Date   NA 140 10/13/2019   K 4.1 10/13/2019   CO2 27 10/13/2019   GLUCOSE 107 (H) 10/13/2019   BUN 18 10/13/2019   CREATININE 0.74 10/13/2019   BILITOT 0.7 10/13/2019   ALKPHOS 68 10/13/2019   AST 17 10/13/2019   ALT 16 10/13/2019   PROT 7.7 10/13/2019   ALBUMIN 4.3 10/13/2019   CALCIUM 9.9 10/13/2019   ANIONGAP 10 10/13/2019   Lab Results  Component Value Date   CHOL 171 09/25/2019   Lab Results  Component Value Date    HDL 74 09/25/2019   Lab Results  Component Value Date   LDLCALC 74 09/25/2019   Lab Results  Component Value Date   TRIG 148 09/25/2019   Lab Results  Component Value Date   CHOLHDL 2.3 09/25/2019   Lab Results  Component Value Date   HGBA1C 5.9 (H) 04/28/2019      Assessment & Plan:   Problem List Items Addressed This Visit      Encounter for annual physical exam    -  Primary Annual exam as documented. Counseling done  re healthy lifestyle involving commitment to 150 minutes exercise per week, heart healthy diet, and attaining healthy weight.The importance of adequate sleep also discussed. Changes in health habits are decided on by the patient with goals and time frames  set for achieving them. Immunization and cancer screening needs are specifically addressed at this visit.   Cardiovascular and Mediastinum   Hypertension goal BP (blood pressure) < 130/80 BP Readings from Last 1 Encounters:  11/16/19 (!) 158/80   Uncontrolled Change Amlodipine to Zebeta (concern for purpura like rash) Continue  Benazepril Counseled for compliance with the medications Advised DASH diet and moderate exercise/walking, at least 150 mins/week    Relevant Medications   bisoprolol (ZEBETA) 5 MG tablet     Musculoskeletal and Integument   Osteopenia Continue Calcium and Vitamin D   Relevant Orders   DG Bone Density    Other Visit Diagnoses       Acute sinusitis, recurrence not specified, unspecified location Started Levofloxacin (Penicillin allergy) Flonase PRN Hand hygiene advised   Relevant Medications   levofloxacin (LEVAQUIN) 500 MG tablet   Encounter for screening for lung cancer       Relevant Orders   CT CHEST LUNG CA SCREEN LOW DOSE W/O CM     Tobacco abuse Advised to quit smoking as soon as possible.  Patient expressed understanding and is trying to cut back.   Meds ordered this encounter  Medications  . bisoprolol (ZEBETA) 5 MG tablet    Sig: Take 1 tablet  (5 mg total) by mouth daily.    Dispense:  30 tablet    Refill:  3  . levofloxacin (LEVAQUIN) 500 MG tablet    Sig: Take 1 tablet (500 mg total) by mouth daily.    Dispense:  7 tablet    Refill:  0    Follow-up: Return in about 3 months (around 02/16/2020).    Lindell Spar, MD

## 2019-11-19 ENCOUNTER — Ambulatory Visit (INDEPENDENT_AMBULATORY_CARE_PROVIDER_SITE_OTHER): Payer: Medicare Other | Admitting: Internal Medicine

## 2019-11-19 ENCOUNTER — Other Ambulatory Visit: Payer: Self-pay

## 2019-11-19 ENCOUNTER — Encounter: Payer: Self-pay | Admitting: Internal Medicine

## 2019-11-19 DIAGNOSIS — I10 Essential (primary) hypertension: Secondary | ICD-10-CM

## 2019-11-19 DIAGNOSIS — F17210 Nicotine dependence, cigarettes, uncomplicated: Secondary | ICD-10-CM | POA: Insufficient documentation

## 2019-11-19 DIAGNOSIS — J449 Chronic obstructive pulmonary disease, unspecified: Secondary | ICD-10-CM | POA: Insufficient documentation

## 2019-11-19 MED ORDER — ALBUTEROL SULFATE HFA 108 (90 BASE) MCG/ACT IN AERS: INHALATION_SPRAY | RESPIRATORY_TRACT | 1 refills | Status: DC

## 2019-11-19 MED ORDER — IRBESARTAN 300 MG PO TABS: 300.0000 mg | ORAL_TABLET | Freq: Every day | ORAL | 11 refills | Status: DC

## 2019-11-19 NOTE — Assessment & Plan Note (Addendum)
Active smoker  - PFT's  03/03/2012  FEV1 1.69 (69 % ) ratio 0.72  p 5 % improvement from saba p ? prior to study with DLCO  17.42 (68%) corrects to 4.53 (92%)  for alv volume and FV curve min curvature/ ERV 37%   - try off acei 11/19/2019 and return for medication reconciliation in 6 weeks  DDX of  difficult airways management almost all start with A and  include Adherence, Ace Inhibitors, Acid Reflux, Active Sinus Disease, Alpha 1 Antitripsin deficiency, Anxiety masquerading as Airways dz,  ABPA,  Allergy(esp in young), Aspiration (esp in elderly), Adverse effects of meds,  Active smoking or vaping, A bunch of PE's (a small clot burden can't cause this syndrome unless there is already severe underlying pulm or vascular dz with poor reserve) plus two Bs  = Bronchiectasis and Beta blocker use..and one C= CHF  Adherence is always the initial "prime suspect" and is a multilayered concern that requires a "trust but verify" approach in every patient - starting with knowing how to use medications, especially inhalers, correctly, keeping up with refills and understanding the fundamental difference between maintenance and prns vs those medications only taken for a very short course and then stopped and not refilled.  - thoroughly confused with details of care, names of meds "I'm just on too many"  -  To keep things simple, I have asked the patient to first separate medicines that are perceived as maintenance, that is to be taken daily "no matter what", from those medicines that are taken on only on an as-needed basis and I have given the patient examples of both, and then return to see  Me to sort out what she really takes  Active smoking also near top of list > see sep a/p  ACEi adverse effects completes the  top of the usual list of suspects and the only way to rule it out is a trial off > see a/p    ? Acid (or non-acid) GERD > always difficult to exclude as up to 75% of pts in some series report no assoc GI/  Heartburn symptoms> rec continue max (24h)  acid suppression and diet restrictions/ reviewed     ? Allergy> continue singulair  symb 160 2bid for now and regroup in 6 weeks  Re saba: I spent extra time with pt today reviewing appropriate use of albuterol for prn use on exertion with the following points: 1) saba is for relief of sob that does not improve by walking a slower pace or resting but rather if the pt does not improve after trying this first. 2) If the pt is convinced, as many are, that saba helps recover from activity faster then it's easy to tell if this is the case by re-challenging : ie stop, take the inhaler, then p 5 minutes try the exact same activity (intensity of workload) that just caused the symptoms and see if they are substantially diminished or not after saba 3) if there is an activity that reproducibly causes the symptoms, try the saba 15 min before the activity on alternate days   If in fact the saba really does help, then fine to continue to use it prn but advised may need to look closer at the maintenance regimen being used to achieve better control of airways disease with exertion.    ? Anxiety/depression/ deconditioning  > usually at the bottom of this list of usual suspects but should be   higher on this pt's  based on H and P and note already on psychotropics and may interfere with adherence and also interpretation of response or lack thereof to symptom management which can be quite subjective.

## 2019-11-19 NOTE — Assessment & Plan Note (Signed)
In the best review of chronic cough to date ( NEJM 2016 375 [phone]) ,  ACEi are now felt to cause cough in up to  20% of pts which is a 4 fold increase from previous reports and does not include the variety of non-specific complaints we see in pulmonary clinic in pts on ACEi but previously attributed to another dx like  Copd/asthma and  include PNDS, throat and chest congestion, "bronchitis", unexplained dyspnea and noct "strangling" sensations, and hoarseness, but also  atypical /refractory GERD symptoms like dysphagia and "bad heartburn"   The only way I know  to prove this is not an "ACEi Case" is a trial off ACEi x a minimum of 6 weeks then regroup once we've eliminated this concern from adding to the confusion in interpreting in varied non-specific complaints

## 2019-11-19 NOTE — Patient Instructions (Addendum)
Benazapril 40 mg > stop this and replace with ibesartan 300 mg one daily in its place   Plan A = Automatic = Always=    symbicort 160 Take 2 puffs first thing in am and then another 2 puffs about 12 hours later.   Work on inhaler technique:  relax and gently blow all the way out then take a nice smooth deep breath back in, triggering the inhaler at same time you start breathing in.  Hold for up to 5 seconds if you can. Blow out thru nose. Rinse and gargle with water when done      Plan B = Backup (to supplement plan A, not to replace it) Only use your albuterol inhaler as a rescue medication to be used if you can't catch your breath by resting or doing a relaxed purse lip breathing pattern.  - The less you use it, the better it will work when you need it. - Ok to use the inhaler up to 2 puffs  every 4 hours if you must but call for appointment if use goes up over your usual need - Don't leave home without it !!  (think of it like the spare tire for your car)   Plan C = Crisis (instead of Plan B but only if Plan B stops working) - only use your albuterol nebulizer if you first try Plan B and it fails to help > ok to use the nebulizer up to every 4 hours but if start needing it regularly call for immediate appointment  The key is to stop smoking completely before smoking completely stops you!         Please schedule a follow up office visit in 6 weeks, call sooner if needed with all medications /inhalers/ solutions in hand so we can verify exactly what you are taking. This includes all medications from all doctors and over the [place] separate them into two bags:  the ones you take automatically, no matter what, vs the ones you take just when you feel you need them "BAG #2 is UP TO YOU"  - this will really help [place] help you take your medications more effectively.

## 2019-11-19 NOTE — Progress Notes (Signed)
[name], female    DOB: 05/21/1945, 74 y.o.   MRN: [id number]   Brief patient profile:  1 yowf active smoker with new onset doe around 2014dx as copd (though PFTs 03/03/12 did not confirm) and referred to pulmonary clinic in [facility]  11/19/2019 by Dr    Moshe Cipro for refractory cough and sob     History of Present Illness  11/19/2019  Pulmonary/ 1st office eval/ Shellene Sweigert / [place] on symbicort 160/atrovent Chief Complaint  Patient presents with  . Consult    sinus congestion for about a 1-2 weeks  Dyspnea:  Goal mb and back 100 ft /  Variably sob shopping / not using [facility] parking  Cough: sporadic  Sleep: fine / some worse cough in am SABA use: not clear   No obvious day to day or daytime variability or assoc excess/ purulent sputum or mucus plugs or hemoptysis or cp or chest tightness, subjective wheeze or overt sinus or hb symptoms.   Sleeping without nocturnal    exacerbation  of respiratory  c/o's or need for noct saba. Also denies any obvious fluctuation of symptoms with weather or environmental changes or other aggravating or alleviating factors except as outlined above   No unusual exposure hx or h/o childhood pna/ asthma or knowledge of premature birth.  Current Allergies, Complete Past Medical History, Past Surgical History, Family History, and Social History were reviewed in [employer] record.  ROS  The following are not active complaints unless bolded Hoarseness, sore throat, dysphagia, dental problems, itching, sneezing,  nasal congestion or discharge of excess mucus or purulent secretions, ear ache,   fever, chills, sweats, unintended wt loss or wt gain, classically pleuritic or exertional cp,  orthopnea pnd or arm/hand swelling  or leg swelling, presyncope, palpitations, abdominal pain, anorexia, nausea, vomiting, diarrhea  or change in bowel habits or change in bladder habits, change in stools or change in urine, dysuria,  hematuria,  rash, arthralgias, visual complaints, headache, numbness, weakness or ataxia or problems with walking or coordination,  change in mood or  memory.           Past Medical History:  Diagnosis Date  . Breast cancer ([place]) 2007   Stage I (T1b N0 M0), grade 1 well-differentiated carcinoma of the left breast status, post lumpectomy followed by radiation therapy. Her estrogen receptor receptors were 93%, progesterone receptors 67%. HER-2/neu was negative. No lymphovascular space invasion was seen. All margins were clear. Ki-67 marker was low at 1% with surgery on 11/15/2004. Treated then with post-lumpectomy radiation, finish  . Breast cancer, left breast ([place]) 2007  . COPD (chronic obstructive pulmonary disease) ([place])   . Diabetes mellitus without complication ([place])   . Diverticula of colon   . Hx of rickettsial disease   . Hyperlipidemia   . Hypertension   . Kidney stones   . Nicotine dependence   . Osteoarthritis     Outpatient Medications Prior to Visit  Medication Sig Dispense Refill  . acetaminophen (TYLENOL) 325 MG tablet Take 650 mg by mouth every 6 (six) hours as needed for moderate pain.     Marland Kitchen albuterol (PROVENTIL) (2.5 MG/3ML) 0.083% nebulizer solution INHALE ONE VIAL VIA NEBULIZER THREE TIMES DAILY. (Patient taking differently: Take 2.5 mg by nebulization 3 (three) times daily as needed for wheezing or shortness of breath. ) 300 mL 0  . aspirin EC 81 MG tablet Take 81 mg by mouth daily.    . ATROVENT HFA 17  MCG/ACT inhaler INHALE 2 PUFFS BY MOUTH FOUR TIMES A DAY. 12.9 g 0  . benazepril (LOTENSIN) 40 MG tablet TAKE ONE TABLET BY MOUTH DAILY. 90 tablet 0  . bisoprolol (ZEBETA) 5 MG tablet Take 1 tablet (5 mg total) by mouth daily. 30 tablet 3  . Calcium Carbonate-Vitamin D (CALCIUM 600 + D PO) Take 1 tablet by mouth 2 (two) times daily. 600/400    . cyclobenzaprine (FLEXERIL) 5 MG tablet TAKE ONE TABLET BY MOUTH AT BEDTIME AS NEEDED. 30 tablet 0  . famotidine (PEPCID) 20  MG tablet Take 20 mg by mouth at bedtime.     . fluticasone (FLONASE) 50 MCG/ACT nasal spray USE 2 SPRAYS IN EACH NOSTRIL DAILY. 16 g 0  . levofloxacin (LEVAQUIN) 500 MG tablet Take 1 tablet (500 mg total) by mouth daily. 7 tablet 0  . loratadine (CLARITIN) 10 MG tablet TAKE 1 TABLET BY MOUTH ONCE DAILY FOR ALLERGIES. 30 tablet 0  . montelukast (SINGULAIR) 10 MG tablet TAKE 1 TABLET BY MOUTH AT BEDTIME. (Patient taking differently: Take 10 mg by mouth at bedtime. ) 90 tablet 0  . polyethylene glycol powder (GLYCOLAX/MIRALAX) powder MIX 1 CAPFUL IN 8 OUNCES OF JUICE OR WATER AND DRINK ONCE DAILY. (Patient taking differently: Take 17 g by mouth daily. ) 527 g 5  . potassium chloride SA (KLOR-CON) 20 MEQ tablet TAKE 1 TABLET BY MOUTH DAILY. 90 tablet 0  . simvastatin (ZOCOR) 40 MG tablet TAKE (1) TABLET BY MOUTH AT BEDTIME. 90 tablet 1  . SYMBICORT 160-4.5 MCG/ACT inhaler INHALE 2 PUFFS INTO THE LUNGS TWICE DAILY. 10.2 g 0  . tiZANidine (ZANAFLEX) 4 MG tablet TAKE ONE TABLET AT NIGHT BEFORE BED AS NEEDED FOR MUSCLE SPASMS. 30 tablet 0  . traMADol (ULTRAM) 50 MG tablet TAKE (1) TABLET BY MOUTH EVERY SIX HOURS AS NEEDED. 60 tablet 0         Objective:     BP (!) 148/92 (BP Location: Right Arm, Cuff Size: Normal)   Pulse 69   Temp 97.9 F (36.6 C) (Other (Comment)) Comment (Src): wrist  Ht '5\' 5"'  (1.651 m)   Wt 177 lb 3.2 oz (80.4 kg)   SpO2 94% Comment: Room air  BMI 29.49 kg/m   SpO2: 94 % (Room air)   amb verbose wf with harsh dry sounding spont upper airway cough and prominent pseudowheezing   HEENT : pt wearing mask not removed for exam due to covid - 19 concerns.   NECK :  without JVD/Nodes/TM/ nl carotid upstrokes bilaterally   LUNGS: no acc muscle use,  Min barrel  contour chest wall with bilateral  slightly decreased bs s audible wheeze and  without cough on insp or exp maneuvers and min  Hyperresonant  to  percussion bilaterally     CV:  RRR  no s3 or murmur or increase in  P2, and no edema   ABD:  Obese soft and nontender with pos end  insp Hoover's  in the supine position. No bruits or organomegaly appreciated, bowel sounds nl  MS:   Nl gait/  ext warm without deformities, calf tenderness, cyanosis or clubbing No obvious joint restrictions   SKIN: warm and dry without lesions    NEURO:  alert, approp, nl sensorium with  no motor or cerebellar deficits apparent.           Assessment   Asthmatic bronchitis , chronic (HCC) Active smoker  - PFT's  03/03/2012  FEV1 1.69 (69 % )  ratio 0.72  p 5 % improvement from saba p ? prior to study with DLCO  17.42 (68%) corrects to 4.53 (92%)  for alv volume and FV curve min curvature/ ERV 37%   - try off acei 11/19/2019 and return for medication reconciliation in 6 weeks  DDX of  difficult airways management almost all start with A and  include Adherence, Ace Inhibitors, Acid Reflux, Active Sinus Disease, Alpha 1 Antitripsin deficiency, Anxiety masquerading as Airways dz,  ABPA,  Allergy(esp in young), Aspiration (esp in elderly), Adverse effects of meds,  Active smoking or vaping, A bunch of PE's (a small clot burden can't cause this syndrome unless there is already severe underlying pulm or vascular dz with poor reserve) plus two Bs  = Bronchiectasis and Beta blocker use..and one C= CHF  Adherence is always the initial "prime suspect" and is a multilayered concern that requires a "trust but verify" approach in every patient - starting with knowing how to use medications, especially inhalers, correctly, keeping up with refills and understanding the fundamental difference between maintenance and prns vs those medications only taken for a very short course and then stopped and not refilled.  - thoroughly confused with details of care, names of meds "I'm just on too many"  -  To keep things simple, I have asked the patient to first separate medicines that are perceived as maintenance, that is to be taken daily "no matter what",  from those medicines that are taken on only on an as-needed basis and I have given the patient examples of both, and then return to see  Me to sort out what she really takes  Active smoking also near top of list > see sep a/p  ACEi adverse effects completes the  top of the usual list of suspects and the only way to rule it out is a trial off > see a/p    ? Acid (or non-acid) GERD > always difficult to exclude as up to 75% of pts in some series report no assoc GI/ Heartburn symptoms> rec continue max (24h)  acid suppression and diet restrictions/ reviewed     ? Allergy> continue singulair  symb 160 2bid for now and regroup in 6 weeks  Re saba: I spent extra time with pt today reviewing appropriate use of albuterol for prn use on exertion with the following points: 1) saba is for relief of sob that does not improve by walking a slower pace or resting but rather if the pt does not improve after trying this first. 2) If the pt is convinced, as many are, that saba helps recover from activity faster then it's easy to tell if this is the case by re-challenging : ie stop, take the inhaler, then p 5 minutes try the exact same activity (intensity of workload) that just caused the symptoms and see if they are substantially diminished or not after saba 3) if there is an activity that reproducibly causes the symptoms, try the saba 15 min before the activity on alternate days   If in fact the saba really does help, then fine to continue to use it prn but advised may need to look closer at the maintenance regimen being used to achieve better control of airways disease with exertion.    ? Anxiety/depression/ deconditioning  > usually at the bottom of this list of usual suspects but should be   higher on this pt's based on H and P and note already on psychotropics and may interfere  with adherence and also interpretation of response or lack thereof to symptom management which can be quite subjective.        Hypertension goal BP (blood pressure) < 130/80 In the best review of chronic cough to date ( NEJM 2016 375 7824-2353) ,  ACEi are now felt to cause cough in up to  20% of pts which is a 4 fold increase from previous reports and does not include the variety of non-specific complaints we see in pulmonary clinic in pts on ACEi but previously attributed to another dx like  Copd/asthma and  include PNDS, throat and chest congestion, "bronchitis", unexplained dyspnea and noct "strangling" sensations, and hoarseness, but also  atypical /refractory GERD symptoms like dysphagia and "bad heartburn"   The only way I know  to prove this is not an "ACEi Case" is a trial off ACEi x a minimum of 6 weeks then regroup once we've eliminated this concern from adding to the confusion in interpreting in varied non-specific complaints     Cigarette smoker Counseled re importance of smoking cessation but did not meet time criteria for separate billing        Each maintenance medication was reviewed in detail including emphasizing most importantly the difference between maintenance and prns and under what circumstances the prns are to be triggered using an action plan format where appropriate.  Total time for H and P, chart review, counseling, teaching device and generating customized AVS unique to this office visit / charting = 60 min           Christinia Gully, MD 11/19/2019

## 2019-11-19 NOTE — Assessment & Plan Note (Signed)
Counseled re importance of smoking cessation but did not meet time criteria for separate billing            Each maintenance medication was reviewed in detail including emphasizing most importantly the difference between maintenance and prns and under what circumstances the prns are to be triggered using an action plan format where appropriate.  Total time for H and P, chart review, counseling, teaching device and generating customized AVS unique to this office visit / charting = 60 min       

## 2019-11-20 ENCOUNTER — Other Ambulatory Visit: Payer: Self-pay | Admitting: Family Medicine

## 2019-11-20 DIAGNOSIS — J3089 Other allergic rhinitis: Secondary | ICD-10-CM

## 2019-11-27 ENCOUNTER — Other Ambulatory Visit: Payer: Self-pay | Admitting: Orthopaedic Surgery

## 2019-12-01 ENCOUNTER — Ambulatory Visit (INDEPENDENT_AMBULATORY_CARE_PROVIDER_SITE_OTHER): Payer: Medicare Other | Admitting: Orthopaedic Surgery

## 2019-12-01 ENCOUNTER — Encounter: Payer: Self-pay | Admitting: Orthopaedic Surgery

## 2019-12-01 ENCOUNTER — Other Ambulatory Visit: Payer: Self-pay

## 2019-12-01 DIAGNOSIS — G8929 Other chronic pain: Secondary | ICD-10-CM

## 2019-12-01 DIAGNOSIS — M25561 Pain in right knee: Secondary | ICD-10-CM | POA: Diagnosis not present

## 2019-12-01 DIAGNOSIS — F17210 Nicotine dependence, cigarettes, uncomplicated: Secondary | ICD-10-CM

## 2019-12-01 NOTE — Patient Instructions (Signed)
Steps to Quit Smoking Smoking tobacco is the leading cause of preventable death. It can affect almost every organ in the body. Smoking puts you and people around you at risk for many serious, long-lasting (chronic) diseases. Quitting smoking can be hard, but it is one of the best things that you can do for your health. It is never too late to quit. How do I get ready to quit? When you decide to quit smoking, make a plan to help you succeed. Before you quit:  Pick a date to quit. Set a date within the next 2 weeks to give you time to prepare.  Write down the reasons why you are quitting. Keep this list in places where you will see it often.  Tell your family, friends, and co-workers that you are quitting. Their support is important.  Talk with your doctor about the choices that may help you quit.  Find out if your health insurance will pay for these treatments.  Know the people, places, things, and activities that make you want to smoke (triggers). Avoid them. What first steps can I take to quit smoking?  Throw away all cigarettes at home, at work, and in your car.  Throw away the things that you use when you smoke, such as ashtrays and lighters.  Clean your car. Make sure to empty the ashtray.  Clean your home, including curtains and carpets. What can I do to help me quit smoking? Talk with your doctor about taking medicines and seeing a counselor at the same time. You are more likely to succeed when you do both.  If you are pregnant or breastfeeding, talk with your doctor about counseling or other ways to quit smoking. Do not take medicine to help you quit smoking unless your doctor tells you to do so. To quit smoking: Quit right away  Quit smoking totally, instead of slowly cutting back on how much you smoke over a period of time.  Go to counseling. You are more likely to quit if you go to counseling sessions regularly. Take medicine You may take medicines to help you quit. Some  medicines need a prescription, and some you can buy over-the-counter. Some medicines may contain a drug called nicotine to replace the nicotine in cigarettes. Medicines may:  Help you to stop having the desire to smoke (cravings).  Help to stop the problems that come when you stop smoking (withdrawal symptoms). Your doctor may ask you to use:  Nicotine patches, gum, or lozenges.  Nicotine inhalers or sprays.  Non-nicotine medicine that is taken by mouth. Find resources Find resources and other ways to help you quit smoking and remain smoke-free after you quit. These resources are most helpful when you use them often. They include:  Online chats with a counselor.  Phone quitlines.  Printed self-help materials.  Support groups or group counseling.  Text messaging programs.  Mobile phone apps. Use apps on your mobile phone or tablet that can help you stick to your quit plan. There are many free apps for mobile phones and tablets as well as websites. Examples include Quit Guide from the CDC and smokefree.gov  What things can I do to make it easier to quit?   Talk to your family and friends. Ask them to support and encourage you.  Call a phone quitline (1-800-QUIT-NOW), reach out to support groups, or work with a counselor.  Ask people who smoke to not smoke around you.  Avoid places that make you want to smoke,   such as: ? Bars. ? Parties. ? Smoke-break areas at work.  Spend time with people who do not smoke.  Lower the stress in your life. Stress can make you want to smoke. Try these things to help your stress: ? Getting regular exercise. ? Doing deep-breathing exercises. ? Doing yoga. ? Meditating. ? Doing a body scan. To do this, close your eyes, focus on one area of your body at a time from head to toe. Notice which parts of your body are tense. Try to relax the muscles in those areas. How will I feel when I quit smoking? Day 1 to 3 weeks Within the first 24 hours,  you may start to have some problems that come from quitting tobacco. These problems are very bad 2-3 days after you quit, but they do not often last for more than 2-3 weeks. You may get these symptoms:  Mood swings.  Feeling restless, nervous, angry, or annoyed.  Trouble concentrating.  Dizziness.  Strong desire for high-sugar foods and nicotine.  Weight gain.  Trouble pooping (constipation).  Feeling like you may vomit (nausea).  Coughing or a sore throat.  Changes in how the medicines that you take for other issues work in your body.  Depression.  Trouble sleeping (insomnia). Week 3 and afterward After the first 2-3 weeks of quitting, you may start to notice more positive results, such as:  Better sense of smell and taste.  Less coughing and sore throat.  Slower heart rate.  Lower blood pressure.  Clearer skin.  Better breathing.  Fewer sick days. Quitting smoking can be hard. Do not give up if you fail the first time. Some people need to try a few times before they succeed. Do your best to stick to your quit plan, and talk with your doctor if you have any questions or concerns. Summary  Smoking tobacco is the leading cause of preventable death. Quitting smoking can be hard, but it is one of the best things that you can do for your health.  When you decide to quit smoking, make a plan to help you succeed.  Quit smoking right away, not slowly over a period of time.  When you start quitting, seek help from your doctor, family, or friends. This information is not intended to replace advice given to you by your health care provider. Make sure you discuss any questions you have with your health care provider. Document Revised: 09/26/2018 Document Reviewed: 03/22/2018 Elsevier Patient Education  2020 [employer] Inc.  

## 2019-12-01 NOTE — Progress Notes (Signed)
PROCEDURE NOTE:  The patient requests injections of the right knee , verbal consent was obtained.  The right knee was prepped appropriately after time out was performed.   Sterile technique was observed and injection of 1 cc of Depo-Medrol 40 mg with several cc's of plain xylocaine. Anesthesia was provided by ethyl chloride and a 20-gauge needle was used to inject the knee area. The injection was tolerated well.  A band aid dressing was applied.  The patient was advised to apply ice later today and tomorrow to the injection sight as needed.  Return in three months.  Electronically Signed Sanjuana Kava, MD 11/16/20211:42 PM

## 2019-12-04 ENCOUNTER — Ambulatory Visit (INDEPENDENT_AMBULATORY_CARE_PROVIDER_SITE_OTHER): Payer: Medicare Other | Admitting: Family Medicine

## 2019-12-04 ENCOUNTER — Encounter: Payer: Self-pay | Admitting: Family Medicine

## 2019-12-04 VITALS — BP 148/92 | Ht 65.0 in | Wt 177.0 lb

## 2019-12-04 DIAGNOSIS — F17210 Nicotine dependence, cigarettes, uncomplicated: Secondary | ICD-10-CM | POA: Diagnosis not present

## 2019-12-04 DIAGNOSIS — J449 Chronic obstructive pulmonary disease, unspecified: Secondary | ICD-10-CM

## 2019-12-04 DIAGNOSIS — Z0000 Encounter for general adult medical examination without abnormal findings: Secondary | ICD-10-CM | POA: Diagnosis not present

## 2019-12-04 MED ORDER — GUAIFENESIN ER 600 MG PO TB12: 1200.0000 mg | ORAL_TABLET | Freq: Two times a day (BID) | ORAL | 0 refills | Status: AC

## 2019-12-04 MED ORDER — PREDNISONE 10 MG (21) PO TBPK: ORAL_TABLET | ORAL | 0 refills | Status: DC

## 2019-12-04 NOTE — Addendum Note (Signed)
Addended by: Perlie Mayo on: 12/04/2019 10:15 AM   Modules accepted: Orders

## 2019-12-04 NOTE — Assessment & Plan Note (Addendum)
Pred dose pk if not improved after holiday she is to call the clinic back. Will avoid Anbx for now Also encouraged Flonase use and mucinex use. Encouraged to not smoke Might benefit from daily doxy to prevent on going recurrence of infection.

## 2019-12-04 NOTE — Addendum Note (Signed)
Addended by: Perlie Mayo on: 12/04/2019 10:21 AM   Modules accepted: Orders

## 2019-12-04 NOTE — Progress Notes (Addendum)
Subjective:   [name] is a 74 y.o. female who presents for Medicare Annual (Subsequent) preventive examination.  [name] reports that she has continued congestion and cough.  She has been treated multiple times over the year with antibiotics secondary to having chronic sinus congestion and nasal congestion and at times chest congestion. Most recently 10 days back she completed a 7-day course of Levaquin.  For treatment of acute sinusitis with recurrence.  She has chronic bronchitis and has also been treated with multiple courses of prednisone through the year.  She does continue to smoke at this time.  Is followed by pulmonology at this time.  Review of Systems Cardiac Risk Factors include: advanced age (>63mn, >>79women)     Objective:    Today's Vitals   12/04/19 0923 12/04/19 0925  BP: (!) 148/92   Weight: 177 lb (80.3 kg)   Height: '5\' 5"'  (1.651 m)   PainSc: 8  8   PainLoc: Hip    Body mass index is 29.45 kg/m.  Advanced Directives 12/04/2019 10/16/2019 10/13/2019 05/15/2019 05/01/2019 04/28/2019 10/02/2018  Does Patient Have a Medical Advance Directive? Yes Yes Yes Yes Yes Yes No  Type of [place] Living will;Healthcare Power of Attorney - - - -  Does patient want to make changes to medical advance directive? - No - Patient declined No - Patient declined - No - Patient declined No - Patient declined -  Copy of [place] Chart? No - copy requested No - copy requested No - copy requested - - - -  Would patient like information on creating a medical advance directive? No - Patient declined - - - - - No - Patient declined    Current Medications (verified) Outpatient Encounter Medications as of 12/04/2019  Medication Sig  . acetaminophen (TYLENOL) 325 MG tablet Take 650 mg by mouth every 6 (six) hours as needed for moderate pain.   .Marland Kitchenalbuterol (PROAIR HFA) 108 (90 Base)  MCG/ACT inhaler 2 puffs every 4 hours as needed only  if your can't catch your breath  . albuterol (PROVENTIL) (2.5 MG/3ML) 0.083% nebulizer solution INHALE ONE VIAL VIA NEBULIZER THREE TIMES DAILY. (Patient taking differently: Take 2.5 mg by nebulization 3 (three) times daily as needed for wheezing or shortness of breath. )  . aspirin EC 81 MG tablet Take 81 mg by mouth daily.  . ATROVENT HFA 17 MCG/ACT inhaler INHALE 2 PUFFS BY MOUTH FOUR TIMES A DAY.  . bisoprolol (ZEBETA) 5 MG tablet Take 1 tablet (5 mg total) by mouth daily.  . Calcium Carbonate-Vitamin D (CALCIUM 600 + D PO) Take 1 tablet by mouth 2 (two) times daily. 600/400  . cyclobenzaprine (FLEXERIL) 5 MG tablet TAKE ONE TABLET BY MOUTH AT BEDTIME AS NEEDED.  . famotidine (PEPCID) 20 MG tablet Take 20 mg by mouth at bedtime.   . fluticasone (FLONASE) 50 MCG/ACT nasal spray USE 2 SPRAYS IN EACH NOSTRIL DAILY.  .Marland Kitchenirbesartan (AVAPRO) 300 MG tablet Take 1 tablet (300 mg total) by mouth daily.  .Marland Kitchenloratadine (CLARITIN) 10 MG tablet TAKE 1 TABLET BY MOUTH ONCE DAILY FOR ALLERGIES.  .Marland Kitchenmontelukast (SINGULAIR) 10 MG tablet TAKE 1 TABLET BY MOUTH AT BEDTIME. (Patient taking differently: Take 10 mg by mouth at bedtime. )  . polyethylene glycol powder (GLYCOLAX/MIRALAX) powder MIX 1 CAPFUL IN 8 OUNCES OF JUICE OR WATER AND DRINK ONCE DAILY. (Patient taking differently: Take  17 g by mouth daily. )  . potassium chloride SA (KLOR-CON) 20 MEQ tablet TAKE 1 TABLET BY MOUTH DAILY.  . simvastatin (ZOCOR) 40 MG tablet TAKE (1) TABLET BY MOUTH AT BEDTIME.  . SYMBICORT 160-4.5 MCG/ACT inhaler INHALE 2 PUFFS INTO THE LUNGS TWICE DAILY.  Marland Kitchen tiZANidine (ZANAFLEX) 4 MG tablet TAKE ONE TABLET AT NIGHT BEFORE BED AS NEEDED FOR MUSCLE SPASMS.  Marland Kitchen traMADol (ULTRAM) 50 MG tablet TAKE (1) TABLET BY MOUTH EVERY SIX HOURS AS NEEDED.  Marland Kitchen guaiFENesin (MUCINEX) 600 MG 12 hr tablet Take 2 tablets (1,200 mg total) by mouth 2 (two) times daily for 14 days.  . predniSONE  (STERAPRED UNI-PAK 21 TAB) 10 MG (21) TBPK tablet Take as directed   No facility-administered encounter medications on file as of 12/04/2019.    Allergies (verified) Penicillins and Sulfonamide derivatives   History: Past Medical History:  Diagnosis Date  . Breast cancer ([place]) 2007   Stage I (T1b N0 M0), grade 1 well-differentiated carcinoma of the left breast status, post lumpectomy followed by radiation therapy. Her estrogen receptor receptors were 93%, progesterone receptors 67%. HER-2/neu was negative. No lymphovascular space invasion was seen. All margins were clear. Ki-67 marker was low at 1% with surgery on 11/15/2004. Treated then with post-lumpectomy radiation, finish  . Breast cancer, left breast ([place]) 2007  . COPD (chronic obstructive pulmonary disease) ([place])   . Diabetes mellitus without complication ([place])   . Diverticula of colon   . Hx of rickettsial disease   . Hyperlipidemia   . Hypertension   . Kidney stones   . Nicotine dependence   . Osteoarthritis    Past Surgical History:  Procedure Laterality Date  . ABDOMINAL HYSTERECTOMY    . BREAST SURGERY  2005 approx   left lumpectomy   . CATARACT EXTRACTION W/PHACO Left 05/01/2019   Procedure: CATARACT EXTRACTION PHACO AND INTRAOCULAR LENS PLACEMENT LEFT EYE CDE=11.73;  Surgeon: Baruch Goldmann, MD;  Location: [facility];  Service: Ophthalmology;  Laterality: Left;  left  . CATARACT EXTRACTION W/PHACO Right 05/15/2019   Procedure: CATARACT EXTRACTION PHACO AND INTRAOCULAR LENS PLACEMENT RIGHT EYE;  Surgeon: Baruch Goldmann, MD;  Location: [facility];  Service: Ophthalmology;  Laterality: Right;  CDE: 16.07  . CHOLECYSTECTOMY N/A 05/26/2014   Procedure: LAPAROSCOPIC CHOLECYSTECTOMY;  Surgeon: Aviva Signs Md, MD;  Location: [facility];  Service: General;  Laterality: N/A;  . COLECTOMY     2005, diverticulitis  . COLONOSCOPY N/A 05/20/2017   Procedure: COLONOSCOPY;  Surgeon: Danie Binder, MD;  Location: [facility];  Service:  Endoscopy;  Laterality: N/A;  1:45pm  . DILATION AND CURETTAGE OF UTERUS    . ESOPHAGOGASTRODUODENOSCOPY N/A 05/20/2017   Procedure: ESOPHAGOGASTRODUODENOSCOPY (EGD);  Surgeon: Danie Binder, MD;  Location: [facility];  Service: Endoscopy;  Laterality: N/A;  . left breast      cancer, in 2006  . TUBAL LIGATION     Family History  Problem Relation Age of Onset  . Breast cancer Mother   . COPD Father   . Emphysema Father   . Throat cancer Sister   . Glaucoma Brother   . Schizophrenia Brother   . Heart attack Brother   . Lung cancer Sister        former smoker  . Hepatitis C Daughter   . Seizures Daughter   . SIDS Son   . Colon cancer Neg Hx   . Colon polyps Neg Hx    Social History   Socioeconomic History  .  Marital status: Single    Spouse name: Not on file  . Number of children: 5  . Years of education: 9th grade   . Highest education level: 9th grade  Occupational History  . Occupation: retired    Fish farm manager: RETIRED  Tobacco Use  . Smoking status: Current Every Day Smoker    Packs/day: 1.00    Years: 50.00    Pack years: 50.00    Types: Cigarettes  . Smokeless tobacco: Never Used  . Tobacco comment: smokes 1/2 pack per day 11/19/2019  Vaping Use  . Vaping Use: Never used  Substance and Sexual Activity  . Alcohol use: No    Alcohol/week: 0.0 standard drinks  . Drug use: No  . Sexual activity: Not Currently  Other Topics Concern  . 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 QUIT WORKING 2003 AFTER CANCER DIAGNOSIS.      MARRIED WITH A RUN AWAY HUSBAND(FUGITIVE 10 YRS). 3 MISCARRIAGES, 5 CHILDREN   Social Determinants of Health   Financial Resource Strain: Low Risk   . Difficulty of Paying Living Expenses: Not hard at all  Food Insecurity: No Food Insecurity  . Worried About Charity fundraiser in the Last Year: Never true  . Ran Out of Food in the Last Year: Never true  Transportation Needs:  No Transportation Needs  . Lack of Transportation (Medical): No  . Lack of Transportation (Non-Medical): No  Physical Activity: Sufficiently Active  . Days of Exercise per Week: 7 days  . Minutes of Exercise per Session: 60 min  Stress: No Stress Concern Present  . Feeling of Stress : Not at all  Social Connections: Moderately Isolated  . Frequency of Communication with Friends and Family: More than three times a week  . Frequency of Social Gatherings with Friends and Family: Once a week  . Attends Religious Services: Never  . Active Member of Clubs or Organizations: No  . Attends Archivist Meetings: Never  . Marital Status: Married    Tobacco Counseling Ready to quit: No Counseling given: Yes Comment: smokes 1/2 pack per day 11/19/2019   Clinical Intake:  Pre-visit preparation completed: Yes  Pain : 0-10 Pain Score: 8  Pain Type: Chronic pain Pain Location: Hip Pain Orientation: Right Pain Descriptors / Indicators: Aching, Constant Pain Onset: More than a month ago Pain Frequency: Constant Pain Relieving Factors: rest and medication Effect of Pain on Daily Activities: mild  Pain Relieving Factors: rest and medication  BMI - recorded: 29.49 Nutritional Status: BMI 25 -29 Overweight Nutritional Risks: None Diabetes: No  How often do you need to have someone help you when you read instructions, pamphlets, or other written materials from your doctor or pharmacy?: 1 - Never What is the last grade level you completed in school?: 9  Diabetic? no  Interpreter Needed?: No  Information entered by :: Laretta Bolster, LPN   Activities of Daily Living In your present state of health, do you have any difficulty performing the following activities: 12/04/2019 04/28/2019  Hearing? N N  Vision? N N  Difficulty concentrating or making decisions? Y N  Walking or climbing stairs? Y N  Dressing or bathing? N N  Doing errands, shopping? N N  Preparing Food and eating ?  N -  Using the Toilet? N -  In the past six months, have you accidently leaked urine? N -  Do you have problems with loss of bowel control?  N -  Managing your Medications? N -  Managing your Finances? N -  Housekeeping or managing your Housekeeping? N -  Some recent data might be hidden    Patient Care Team: [name], MD as PCP - General Branch, Alphonse Guild, MD as PCP - Cardiology (Cardiology) Sanjuana Kava, MD as Consulting Physician (Orthopedic Surgery) Whitney Muse, Kelby Fam, MD (Inactive) as Consulting Physician (Hematology and Oncology) Danie Binder, MD (Inactive) as Consulting Physician (Gastroenterology)  Indicate any recent Medical Services you may have received from other than [facility] providers in the past year (date may be approximate).     Assessment:   This is a routine wellness examination for Clatie.  Hearing/Vision screen No exam data present  Dietary issues and exercise activities discussed: Current Exercise Habits: Home exercise routine, Type of exercise: walking, Time (Minutes): 60, Frequency (Times/Week): 7, Weekly Exercise (Minutes/Week): 420, Intensity: Mild  Goals    . Increase physical activity    . Quit smoking / using tobacco     Starting 01/04/2016 patient would like to cut back to 4 cigarettes a day.      Depression Screen PHQ 2/9 Scores 12/04/2019 12/04/2019 11/16/2019 08/20/2019 04/09/2019 12/03/2018 11/04/2018  PHQ - 2 Score 0 0 0 0 0 0 0  PHQ- 9 Score - - - - - - -    Fall Risk Fall Risk  12/04/2019 11/16/2019 09/29/2019 08/20/2019 06/23/2019  Falls in the past year? 1 0 0 0 1  Number falls in past yr: 0 0 0 0 0  Injury with Fall? 1 0 0 0 0  Risk for fall due to : Impaired balance/gait;Impaired mobility No Fall Risks No Fall Risks No Fall Risks -  Follow up Falls evaluation completed Falls evaluation completed Falls evaluation completed Falls evaluation completed -    Any stairs in or around the home? No  If so, are there any without  handrails? No  Home free of loose throw rugs in walkways, pet beds, electrical cords, etc? Yes  Adequate lighting in your home to reduce risk of falls? Yes   ASSISTIVE DEVICES UTILIZED TO PREVENT FALLS:  Life alert? No  Use of a cane, walker or w/c? No  Grab bars in the bathroom? No  Shower chair or bench in shower? No  Elevated toilet seat or a handicapped toilet? No   TIMED UP AND GO:  Was the test performed? No .  Length of time to ambulate n/a.    Cognitive Function:     6CIT Screen 12/04/2019 12/03/2018 11/25/2017 11/12/2016 01/04/2016  What Year? 0 points 0 points 0 points 0 points 0 points  What month? 0 points 0 points 0 points 0 points 0 points  What time? 0 points 0 points 0 points 0 points 0 points  Count back from 20 0 points 0 points 0 points 0 points 0 points  Months in reverse 0 points 2 points 2 points 0 points 0 points  Repeat phrase 0 points 4 points 0 points 0 points 0 points  Total Score 0 6 2 0 0    Immunizations Immunization History  Administered Date(s) Administered  . Fluad Quad(high Dose 65+) 09/09/2018  . Influenza Split 10/31/2011  . Influenza Whole 11/18/2008, 10/12/2010  . Influenza,inj,Quad PF,6+ Mos 11/12/2012, 09/18/2013, 12/22/2014, 10/04/2015, 09/09/2017  . PFIZER SARS-COV-2 Vaccination 03/23/2019, 04/22/2019  . PPD Test 01/28/2012  . Pneumococcal Conjugate-13 01/04/2015  . Pneumococcal Polysaccharide-23 06/13/2010  . Tdap 01/28/2012  . Zoster Recombinat (Shingrix) 11/04/2017, 01/24/2018  TDAP status: Up to date Flu Vaccine status: Declined, Education has been provided regarding the importance of this vaccine but patient still declined. Advised may receive this vaccine at local pharmacy or Health Dept. Aware to provide a copy of the vaccination record if obtained from local pharmacy or Health Dept. Verbalized acceptance and understanding. Pneumococcal vaccine status: Up to date Covid-19 vaccine status: Completed  vaccines  Qualifies for Shingles Vaccine? Yes   Zostavax completed No   Shingrix Completed?: No.    Education has been provided regarding the importance of this vaccine. Patient has been advised to call insurance company to determine out of pocket expense if they have not yet received this vaccine. Advised may also receive vaccine at local pharmacy or Health Dept. Verbalized acceptance and understanding.  Screening Tests Health Maintenance  Topic Date Due  . OPHTHALMOLOGY EXAM  02/26/2020 (Originally 09/07/2015)  . INFLUENZA VACCINE  04/14/2020 (Originally 08/16/2019)  . FOOT EXAM  10/06/2020 (Originally 01/23/2017)  . TETANUS/TDAP  01/27/2022  . COLONOSCOPY  05/21/2027  . DEXA SCAN  Completed  . COVID-19 Vaccine  Completed  . Hepatitis C Screening  Completed  . PNA vac Low Risk Adult  Completed  . HEMOGLOBIN A1C  Discontinued    Health Maintenance  There are no preventive care reminders to display for this patient.  Colorectal cancer screening: Completed 05/20/17. Repeat every 10 years Mammogram status: Completed 06/29/19. Repeat every year Bone Density status: Completed 07/13/13. Results reflect: Bone density results: NORMAL. Repeat every 5 years.  Lung Cancer Screening: (Low Dose CT Chest recommended if Age 14-80 years, 30 pack-year currently smoking OR have quit w/in 15years.) does qualify  Lung Cancer Screening Referral: sent today  Additional Screening:  Hepatitis C Screening: does not qualify  Vision Screening: Recommended annual ophthalmology exams for early detection of glaucoma and other disorders of the eye. Is the patient up to date with their annual eye exam?  Yes  Who is the provider or what is the name of the office in which the patient attends annual eye exams? [facility] If pt is not established with a provider, would they like to be referred to a provider to establish care? No .   Dental Screening: Recommended annual dental exams for proper oral  hygiene  Community Resource Referral / Chronic Care Management: CRR required this visit?  No   CCM required this visit?  No      Plan:      1. Encounter for Medicare annual wellness exam  1. Encounter for Medicare annual wellness exam   2. Asthmatic bronchitis , chronic (HCC) Pred dose pk if not improved after holiday she is to call the clinic back. Will avoid Anbx for now Also encouraged Flonase use and mucinex use. Encouraged to not smoke Might benefit from daily doxy to prevent on going recurrence of infection.   3. Cigarette smoker Encouraged smoking cessation  I have personally reviewed and noted the following in the patient's chart:   . Medical and social history . Use of alcohol, tobacco or illicit drugs  . Current medications and supplements . Functional ability and status . Nutritional status . Physical activity . Advanced directives . List of other physicians . Hospitalizations, surgeries, and ER visits in previous 12 months . Vitals . Screenings to include cognitive, depression, and falls . Referrals and appointments  In addition, I have reviewed and discussed with patient certain preventive protocols, quality metrics, and best practice recommendations. A written personalized care plan for preventive  services as well as general preventive health recommendations were provided to patient.     Perlie Mayo, NP   12/04/2019   Nurse Notes: AWV conducted in office over the phone with patients consent given to do telehealth visit via audio. Provider in office. Visit took 35 minutes to complete.

## 2019-12-04 NOTE — Assessment & Plan Note (Signed)
She is strongly encouraged to have smoking cessation.  Given the fact that she has chronic asthmatic bronchitis and chronic infections secondary to not having a strong defense system in her airway area to prevent bacterial invasion.

## 2019-12-04 NOTE — Patient Instructions (Addendum)
[name] , Thank you for taking time to come for your Medicare Wellness Visit. I appreciate your ongoing commitment to your health goals. Please review the following plan we discussed and let me know if I can assist you in the future.   Screening recommendations/referrals: Colonoscopy: 05/21/27 Mammogram: Complete Bone Density: Complete Recommended yearly ophthalmology/optometry visit for glaucoma screening and checkup Recommended yearly dental visit for hygiene and checkup  Vaccinations: Influenza vaccine: Due Pneumococcal vaccine: Complete Tdap vaccine: 01/27/22 Shingles vaccine: Due   Advanced directives: POA  Conditions/risks identified: None  Next appointment: 02/16/20 @ 2 pm   Preventive Care 75 Years and Older, Female Preventive care refers to lifestyle choices and visits with your health care provider that can promote health and wellness. What does preventive care include?  A yearly physical exam. This is also called an annual well check.  Dental exams once or twice a year.  Routine eye exams. Ask your health care provider how often you should have your eyes checked.  Personal lifestyle choices, including:  Daily care of your teeth and gums.  Regular physical activity.  Eating a healthy diet.  Avoiding tobacco and drug use.  Limiting alcohol use.  Practicing safe sex.  Taking low-dose aspirin every day.  Taking vitamin and mineral supplements as recommended by your health care provider. What happens during an annual well check? The services and screenings done by your health care provider during your annual well check will depend on your age, overall health, lifestyle risk factors, and family history of disease. Counseling  Your health care provider may ask you questions about your:  Alcohol use.  Tobacco use.  Drug use.  Emotional well-being.  Home and relationship well-being.  Sexual activity.  Eating habits.  History of falls.  Memory and  ability to understand (cognition).  Work and work Statistician.  Reproductive health. Screening  You may have the following tests or measurements:  Height, weight, and BMI.  Blood pressure.  Lipid and cholesterol levels. These may be checked every 5 years, or more frequently if you are over 75 years old.  Skin check.  Lung cancer screening. You may have this screening every year starting at age 75 if you have a 30-pack-year history of smoking and currently smoke or have quit within the past 15 years.  Fecal occult blood test (FOBT) of the stool. You may have this test every year starting at age 75.  Flexible sigmoidoscopy or colonoscopy. You may have a sigmoidoscopy every 5 years or a colonoscopy every 10 years starting at age 75.  Hepatitis C blood test.  Hepatitis B blood test.  Sexually transmitted disease (STD) testing.  Diabetes screening. This is done by checking your blood sugar (glucose) after you have not eaten for a while (fasting). You may have this done every 1-3 years.  Bone density scan. This is done to screen for osteoporosis. You may have this done starting at age 75.  Mammogram. This may be done every 1-2 years. Talk to your health care provider about how often you should have regular mammograms. Talk with your health care provider about your test results, treatment options, and if necessary, the need for more tests. Vaccines  Your health care provider may recommend certain vaccines, such as:  Influenza vaccine. This is recommended every year.  Tetanus, diphtheria, and acellular pertussis (Tdap, Td) vaccine. You may need a Td booster every 10 years.  Zoster vaccine. You may need this after age 75.  Pneumococcal 13-valent conjugate (PCV13)  vaccine. One dose is recommended after age 75.  Pneumococcal polysaccharide (PPSV23) vaccine. One dose is recommended after age 76. Talk to your health care provider about which screenings and vaccines you need and how  often you need them. This information is not intended to replace advice given to you by your health care provider. Make sure you discuss any questions you have with your health care provider. Document Released: 01/28/2015 Document Revised: 09/21/2015 Document Reviewed: 11/02/2014 Elsevier Interactive Patient Education  2017 [place] Prevention in the Home Falls can cause injuries. They can happen to people of all ages. There are many things you can do to make your home safe and to help prevent falls. What can I do on the outside of my home?  Regularly fix the edges of walkways and driveways and fix any cracks.  Remove anything that might make you trip as you walk through a door, such as a raised step or threshold.  Trim any bushes or trees on the path to your home.  Use bright outdoor lighting.  Clear any walking paths of anything that might make someone trip, such as rocks or tools.  Regularly check to see if handrails are loose or broken. Make sure that both sides of any steps have handrails.  Any raised decks and porches should have guardrails on the edges.  Have any leaves, snow, or ice cleared regularly.  Use sand or salt on walking paths during winter.  Clean up any spills in your garage right away. This includes oil or grease spills. What can I do in the bathroom?  Use night lights.  Install grab bars by the toilet and in the tub and shower. Do not use towel bars as grab bars.  Use non-skid mats or decals in the tub or shower.  If you need to sit down in the shower, use a plastic, non-slip stool.  Keep the floor dry. Clean up any water that spills on the floor as soon as it happens.  Remove soap buildup in the tub or shower regularly.  Attach bath mats securely with double-sided non-slip rug tape.  Do not have throw rugs and other things on the floor that can make you trip. What can I do in the bedroom?  Use night lights.  Make sure that you have a  light by your bed that is easy to reach.  Do not use any sheets or blankets that are too big for your bed. They should not hang down onto the floor.  Have a firm chair that has side arms. You can use this for support while you get dressed.  Do not have throw rugs and other things on the floor that can make you trip. What can I do in the kitchen?  Clean up any spills right away.  Avoid walking on wet floors.  Keep items that you use a lot in easy-to-reach places.  If you need to reach something above you, use a strong step stool that has a grab bar.  Keep electrical cords out of the way.  Do not use floor polish or wax that makes floors slippery. If you must use wax, use non-skid floor wax.  Do not have throw rugs and other things on the floor that can make you trip. What can I do with my stairs?  Do not leave any items on the stairs.  Make sure that there are handrails on both sides of the stairs and use them. Fix handrails that are  broken or loose. Make sure that handrails are as long as the stairways.  Check any carpeting to make sure that it is firmly attached to the stairs. Fix any carpet that is loose or worn.  Avoid having throw rugs at the top or bottom of the stairs. If you do have throw rugs, attach them to the floor with carpet tape.  Make sure that you have a light switch at the top of the stairs and the bottom of the stairs. If you do not have them, ask someone to add them for you. What else can I do to help prevent falls?  Wear shoes that:  Do not have high heels.  Have rubber bottoms.  Are comfortable and fit you well.  Are closed at the toe. Do not wear sandals.  If you use a stepladder:  Make sure that it is fully opened. Do not climb a closed stepladder.  Make sure that both sides of the stepladder are locked into place.  Ask someone to hold it for you, if possible.  Clearly mark and make sure that you can see:  Any grab bars or  handrails.  First and last steps.  Where the edge of each step is.  Use tools that help you move around (mobility aids) if they are needed. These include:  Canes.  Walkers.  Scooters.  Crutches.  Turn on the lights when you go into a dark area. Replace any light bulbs as soon as they burn out.  Set up your furniture so you have a clear path. Avoid moving your furniture around.  If any of your floors are uneven, fix them.  If there are any pets around you, be aware of where they are.  Review your medicines with your doctor. Some medicines can make you feel dizzy. This can increase your chance of falling. Ask your doctor what other things that you can do to help prevent falls. This information is not intended to replace advice given to you by your health care provider. Make sure you discuss any questions you have with your health care provider. Document Released: 10/28/2008 Document Revised: 06/09/2015 Document Reviewed: 02/05/2014 Elsevier Interactive Patient Education  2017 [employer].

## 2019-12-08 ENCOUNTER — Other Ambulatory Visit: Payer: Self-pay

## 2019-12-08 DIAGNOSIS — R918 Other nonspecific abnormal finding of lung field: Secondary | ICD-10-CM

## 2019-12-08 DIAGNOSIS — J449 Chronic obstructive pulmonary disease, unspecified: Secondary | ICD-10-CM

## 2019-12-08 MED ORDER — ATROVENT HFA 17 MCG/ACT IN AERS: INHALATION_SPRAY | RESPIRATORY_TRACT | 0 refills | Status: DC

## 2019-12-08 MED ORDER — BUDESONIDE-FORMOTEROL FUMARATE 160-4.5 MCG/ACT IN AERO: 2.0000 | INHALATION_SPRAY | Freq: Two times a day (BID) | RESPIRATORY_TRACT | 0 refills | Status: DC

## 2019-12-17 ENCOUNTER — Other Ambulatory Visit: Payer: Self-pay

## 2019-12-17 ENCOUNTER — Other Ambulatory Visit: Payer: Medicare Other

## 2019-12-17 DIAGNOSIS — Z20822 Contact with and (suspected) exposure to covid-19: Secondary | ICD-10-CM

## 2019-12-19 LAB — NOVEL CORONAVIRUS, NAA: SARS-CoV-2, NAA: NOT DETECTED

## 2019-12-19 LAB — SARS-COV-2, NAA 2 DAY TAT

## 2019-12-21 ENCOUNTER — Other Ambulatory Visit: Payer: Self-pay | Admitting: Family Medicine

## 2019-12-24 ENCOUNTER — Other Ambulatory Visit: Payer: Self-pay | Admitting: Family Medicine

## 2019-12-24 ENCOUNTER — Telehealth: Payer: Self-pay

## 2019-12-24 DIAGNOSIS — H02834 Dermatochalasis of left upper eyelid: Secondary | ICD-10-CM | POA: Diagnosis not present

## 2019-12-24 DIAGNOSIS — H02831 Dermatochalasis of right upper eyelid: Secondary | ICD-10-CM | POA: Diagnosis not present

## 2019-12-24 DIAGNOSIS — Z961 Presence of intraocular lens: Secondary | ICD-10-CM | POA: Diagnosis not present

## 2019-12-24 NOTE — Telephone Encounter (Signed)
Patient called wanting to know if she could get booster and if her test was negative. Left generic message stating test was negative and she could get booster.

## 2019-12-28 ENCOUNTER — Other Ambulatory Visit (HOSPITAL_COMMUNITY): Payer: Self-pay

## 2019-12-28 ENCOUNTER — Encounter (HOSPITAL_COMMUNITY): Payer: Self-pay

## 2019-12-28 ENCOUNTER — Other Ambulatory Visit: Payer: Self-pay | Admitting: Orthopaedic Surgery

## 2019-12-28 ENCOUNTER — Ambulatory Visit: Payer: Medicare Other | Admitting: Internal Medicine

## 2019-12-28 DIAGNOSIS — Z122 Encounter for screening for malignant neoplasm of respiratory organs: Secondary | ICD-10-CM

## 2019-12-28 DIAGNOSIS — Z87891 Personal history of nicotine dependence: Secondary | ICD-10-CM

## 2019-12-28 NOTE — Progress Notes (Signed)
Received referral for initial lung cancer screening scan. Contacted patient and obtained smoking history (started age 74 at 1/4PPD until the age of 33 when she increased to 1/2PPD, current smoker, continuing to smoke 1/2 PPD, 29 pack year) as well as answering questions related to the screening process. Patient denies signs/symptoms of lung cancer such as weight loss or hemoptysis. Patient denies comorbidity that would prevent curative treatment if lung cancer were to be found. Patient to be called with appt for [facility] and LDCT.

## 2019-12-29 ENCOUNTER — Encounter (HOSPITAL_COMMUNITY): Payer: Self-pay

## 2019-12-29 ENCOUNTER — Ambulatory Visit (INDEPENDENT_AMBULATORY_CARE_PROVIDER_SITE_OTHER): Payer: Medicare Other | Admitting: Internal Medicine

## 2019-12-29 ENCOUNTER — Other Ambulatory Visit: Payer: Self-pay

## 2019-12-29 ENCOUNTER — Encounter: Payer: Self-pay | Admitting: Internal Medicine

## 2019-12-29 VITALS — BP 162/100 | HR 106 | Temp 97.7°F | Ht 65.0 in | Wt 179.0 lb

## 2019-12-29 DIAGNOSIS — J449 Chronic obstructive pulmonary disease, unspecified: Secondary | ICD-10-CM | POA: Diagnosis not present

## 2019-12-29 DIAGNOSIS — I10 Essential (primary) hypertension: Secondary | ICD-10-CM | POA: Diagnosis not present

## 2019-12-29 DIAGNOSIS — Z23 Encounter for immunization: Secondary | ICD-10-CM | POA: Diagnosis not present

## 2019-12-29 MED ORDER — HYDROCHLOROTHIAZIDE 25 MG PO TABS: 25.0000 mg | ORAL_TABLET | Freq: Every day | ORAL | 11 refills | Status: DC

## 2019-12-29 MED ORDER — AZITHROMYCIN 250 MG PO TABS: ORAL_TABLET | ORAL | 0 refills | Status: DC

## 2019-12-29 NOTE — Progress Notes (Signed)
[name], female    DOB: 30-May-1945, 74 y.o.   MRN: [id number]   Brief patient profile:  81 yowf active smoker with new onset doe around 2014 dx as copd (though PFTs 03/03/12 did not confirm) and referred to pulmonary clinic in [facility]  11/19/2019 by Dr    Moshe Cipro for refractory cough and sob     History of Present Illness  11/19/2019  Pulmonary/ 1st office eval/ Janese Radabaugh / [place] on symbicort 160/atrovent Chief Complaint  Patient presents with  . Consult    sinus congestion for about a 1-2 weeks  Dyspnea:  Goal mb and back 100 ft /  Variably sob shopping / not using HC parking  Cough: sporadic  Sleep: fine / some worse cough in am SABA use: not clear  rec Benazapril 40 mg  > stop this and replace with ibesartan 300 mg one daily in its place  Plan A = Automatic = Always=    symbicort 160 Take 2 puffs first thing in am and then another 2 puffs about 12 hours later.  Work on inhaler technique:   Plan B = Backup (to supplement plan A, not to replace it) Only use your albuterol inhaler as a rescue medication  Plan C = Crisis (instead of Plan B but only if Plan B stops working) - only use your albuterol nebulizer if you first try Plan B and it fails to help  The key is to stop smoking completely before smoking completely stops you! Please schedule a follow up office visit in 6 weeks, call sooner if needed with all medications     12/29/2019  f/u ov/[facility] office/Masoud Nyce re: doe/ brown mucus x one week already took medrol dose pack p televisit with pcp  Chief Complaint  Patient presents with  . Follow-up    Still bothered by sinus congestion. She is c/o cough with brown sputum over the past wk. She is using the albuterol inhaler about 3 x per wk.   Asthmatic bronchitis, chronic (HCC) Active smoker  - PFT's  03/03/2012  FEV1 1.69 (69 % ) ratio 0.72  p 5 % improvement from saba p ? prior to study with DLCO  17.42 (68%) corrects to 4.53 (92%)  for alv volume and  FV curve min curvature/ ERV 37%   - try off acei 11/19/2019 and return for medication reconciliation in 6 weeks  Hypertension goal BP (blood pressure) < 130/80  Dyspnea:  Better though very inactive Cough:  X one week worse as above   SABA use: very poor insight into ABC plan, could not articulate any part of it even with prompting  02: none    No obvious day to day or daytime variability or assoc   mucus plugs or hemoptysis or cp or chest tightness, subjective wheeze or overt sinus or hb symptoms.     Also denies any obvious fluctuation of symptoms with weather or environmental changes or other aggravating or alleviating factors except as outlined above   No unusual exposure hx or h/o childhood pna/ asthma or knowledge of premature birth.  Current Allergies, Complete Past Medical History, Past Surgical History, Family History, and Social History were reviewed in [employer] record.  ROS  The following are not active complaints unless bolded Hoarseness, sore throat, dysphagia, dental problems, itching, sneezing,  nasal congestion or discharge of excess mucus or purulent secretions, ear ache,   fever, chills, sweats, unintended wt loss or wt gain, classically pleuritic or  exertional cp,  orthopnea pnd or arm/hand swelling  or leg swelling, presyncope, palpitations, abdominal pain, anorexia, nausea, vomiting, diarrhea  or change in bowel habits or change in bladder habits, change in stools or change in urine, dysuria, hematuria,  rash, arthralgias, visual complaints, headache, numbness, weakness or ataxia or problems with walking or coordination,  change in mood or  memory.        Current Meds  Medication Sig  . acetaminophen (TYLENOL) 325 MG tablet Take 650 mg by mouth every 6 (six) hours as needed for moderate pain.   Marland Kitchen albuterol (PROAIR HFA) 108 (90 Base) MCG/ACT inhaler 2 puffs every 4 hours as needed only  if your can't catch your breath  . albuterol (PROVENTIL) (2.5  MG/3ML) 0.083% nebulizer solution INHALE ONE VIAL VIA NEBULIZER THREE TIMES DAILY. (Patient taking differently: Take 2.5 mg by nebulization 3 (three) times daily as needed for wheezing or shortness of breath.)  . aspirin EC 81 MG tablet Take 81 mg by mouth daily.  . budesonide-formoterol (SYMBICORT) 160-4.5 MCG/ACT inhaler Inhale 2 puffs into the lungs 2 (two) times daily.  . Calcium Carbonate-Vitamin D (CALCIUM 600 + D PO) Take 1 tablet by mouth 2 (two) times daily. 600/400  . calcium-vitamin D (OSCAL WITH D) 500-200 MG-UNIT tablet Take 1 tablet by mouth 2 (two) times daily.  . cyclobenzaprine (FLEXERIL) 5 MG tablet TAKE ONE TABLET BY MOUTH AT BEDTIME AS NEEDED.  . famotidine (PEPCID) 20 MG tablet Take 20 mg by mouth at bedtime.   . fluticasone (FLONASE) 50 MCG/ACT nasal spray USE 2 SPRAYS IN EACH NOSTRIL DAILY.  Marland Kitchen ipratropium (ATROVENT HFA) 17 MCG/ACT inhaler INHALE 2 PUFFS BY MOUTH FOUR TIMES A DAY.  . irbesartan (AVAPRO) 300 MG tablet Take 1 tablet (300 mg total) by mouth daily.  Marland Kitchen loratadine (CLARITIN) 10 MG tablet TAKE 1 TABLET BY MOUTH ONCE DAILY FOR ALLERGIES.  Marland Kitchen montelukast (SINGULAIR) 10 MG tablet TAKE 1 TABLET BY MOUTH AT BEDTIME.  Marland Kitchen omeprazole (PRILOSEC) 20 MG capsule Take 20 mg by mouth daily as needed.  . polyethylene glycol powder (GLYCOLAX/MIRALAX) powder MIX 1 CAPFUL IN 8 OUNCES OF JUICE OR WATER AND DRINK ONCE DAILY. (Patient taking differently: Take 17 g by mouth daily.)  . potassium chloride SA (KLOR-CON) 20 MEQ tablet TAKE 1 TABLET BY MOUTH DAILY.  . simvastatin (ZOCOR) 40 MG tablet TAKE (1) TABLET BY MOUTH AT BEDTIME.  . traMADol (ULTRAM) 50 MG tablet TAKE (1) TABLET BY MOUTH EVERY SIX HOURS AS NEEDED.                   Past Medical History:  Diagnosis Date  . Breast cancer ([place]) 2007   Stage I (T1b N0 M0), grade 1 well-differentiated carcinoma of the left breast status, post lumpectomy followed by radiation therapy. Her estrogen receptor receptors were 93%,  progesterone receptors 67%. HER-2/neu was negative. No lymphovascular space invasion was seen. All margins were clear. Ki-67 marker was low at 1% with surgery on 11/15/2004. Treated then with post-lumpectomy radiation, finish  . Breast cancer, left breast ([place]) 2007  . COPD (chronic obstructive pulmonary disease) ([place])   . Diabetes mellitus without complication ([place])   . Diverticula of colon   . Hx of rickettsial disease   . Hyperlipidemia   . Hypertension   . Kidney stones   . Nicotine dependence   . Osteoarthritis        Objective:     Wt Readings from Last 3 Encounters:  12/29/19 179  lb (81.2 kg)  12/04/19 177 lb (80.3 kg)  11/19/19 177 lb 3.2 oz (80.4 kg)     Vital signs reviewed - Note on arrival 12/29/2019  02 sats  97% on RA and bp 162/100  amb slt tremulous wf rattling cough      HEENT : pt wearing mask not removed for exam due to covid - 19 concerns.   NECK :  without JVD/Nodes/TM/ nl carotid upstrokes bilaterally   LUNGS: no acc muscle use,  Min barrel  contour chest wall with bilateral  slightly decreased bs s audible wheeze and  without cough on insp or exp maneuvers and min  Hyperresonant  to  percussion bilaterally     CV:  RRR  no s3 or murmur or increase in P2, and no edema   ABD:  soft and nontender with pos end  insp Hoover's  in the supine position. No bruits or organomegaly appreciated, bowel sounds nl  MS:   Nl gait/  ext warm without deformities, calf tenderness, cyanosis or clubbing No obvious joint restrictions   SKIN: warm and dry without lesions    NEURO:  alert, approp, nl sensorium with  no motor or cerebellar deficits apparent.       For LDSCT chest 01/26/20       Assessment

## 2019-12-29 NOTE — Assessment & Plan Note (Signed)
Active smoker  - PFT's  03/03/2012  FEV1 1.69 (69 % ) ratio 0.72  p 5 % improvement from saba p ? prior to study with DLCO  17.42 (68%) corrects to 4.53 (92%)  for alv volume and FV curve min curvature/ ERV 37%   - try off acei 11/19/2019 and return for medication reconciliation in 6 weeks  Some better then flare and still has clear lungs on exam but very poor hfa  - The proper method of use, as well as anticipated side effects, of a metered-dose inhaler were discussed and demonstrated to the patient using teach back method. Improved effectiveness after extensive coaching during this visit to a level of approximately 50 % from a baseline of 25 % > suggested she use empty symbicort cannister for practice use.  Also for flare:  Zpak/mucinex  no more pred and if cough continues consider dx of Upper airway cough syndrome (previously labeled PNDS),  is so named because it's frequently impossible to sort out how much is  CR/sinusitis with freq throat clearing (which can be related to primary GERD)   vs  causing  secondary (" extra esophageal")  GERD from wide swings in gastric pressure that occur with throat clearing, often  promoting self use of mint and menthol lozenges that reduce the lower esophageal sphincter tone and exacerbate the problem further in a cyclical fashion.   These are the same pts (now being labeled as having "irritable larynx syndrome" by some cough centers) who not infrequently have a history of having failed to tolerate ace inhibitors,  dry powder inhalers or biphosphonates or report having atypical/extraesophageal reflux symptoms that don't respond to standard doses of PPI  and are easily confused as having aecopd or asthma flares by even experienced allergists/ pulmonologists (myself included).   >>> rx will be for gerd next if not better.

## 2019-12-29 NOTE — Progress Notes (Signed)
Patient scheduled for Lung Cancer Screening [facility] on 01/21/20 and LDCT on 01/26/20. Patient aware.

## 2019-12-29 NOTE — Assessment & Plan Note (Signed)
Try off acei 11/19/2019 due to cough/ pseudowheeze - added hctz 25 mg daily 12/29/2019   Not optimally controlled on present regimen. I reviewed this with the patient and emphasized importance of follow-up with primary care.    >>> in meantime add hctz 25 mg daily as above          Each maintenance medication was reviewed in detail including emphasizing most importantly the difference between maintenance and prns and under what circumstances the prns are to be triggered using an action plan format where appropriate.  Total time for H and P, chart review, counseling, teaching device and generating customized AVS unique to this office visit / charting = 35 min

## 2019-12-29 NOTE — Patient Instructions (Addendum)
Continue Irbesartan 300 mg one daily plus add  hydrodiuril (hydrochlorothiazide) 25 mg one daily   zpak should changes mucus brown to clear   The key is to stop smoking completely before smoking completely stops you!  For cough > mucinex 1200 mg every 12 hours as needed (over the counter)   Plan A = Automatic = Always=    symbicort 160 Take 2 puffs first thing in am and then another 2 puffs about 12 hours later atrovent is 2 puffs four times daily   Work on inhaler technique:  relax and gently blow all the way out then take a nice smooth deep breath back in, triggering the inhaler at same time you start breathing in.  Hold for up to 5 seconds if you can. Blow out thru nose. Rinse and gargle with water when done      Plan B = Backup (to supplement plan A, not to replace it) Only use your albuterol inhaler as a rescue medication to be used if you can't catch your breath by resting or doing a relaxed purse lip breathing pattern.  - The less you use it, the better it will work when you need it. - Ok to use the inhaler up to 2 puffs  every 4 hours if you must but call for appointment if use goes up over your usual need - Don't leave home without it !!  (think of it like the spare tire for your car)   Plan C = Crisis (instead of Plan B but only if Plan B stops working) - only use your albuterol nebulizer if you first try Plan B and it fails to help > ok to use the nebulizer up to every 4 hours but if start needing it regularly call for immediate appointment   Please schedule a follow up office visit in 6 weeks with pfts on return

## 2020-01-18 ENCOUNTER — Encounter: Payer: Self-pay | Admitting: Internal Medicine

## 2020-01-19 ENCOUNTER — Other Ambulatory Visit: Payer: 59

## 2020-01-19 ENCOUNTER — Other Ambulatory Visit: Payer: Self-pay

## 2020-01-19 DIAGNOSIS — Z20822 Contact with and (suspected) exposure to covid-19: Secondary | ICD-10-CM

## 2020-01-21 ENCOUNTER — Inpatient Hospital Stay (HOSPITAL_COMMUNITY): Payer: Medicare Other | Attending: Nurse Practitioner | Admitting: Oncology

## 2020-01-21 DIAGNOSIS — Z87891 Personal history of nicotine dependence: Secondary | ICD-10-CM | POA: Diagnosis not present

## 2020-01-21 NOTE — Progress Notes (Signed)
Virtual Visit via Video Note  I connected with [name] 01/21/20 at 10:10 AM EST by a video enabled telemedicine application and verified that I am speaking with the correct person using two identifiers.  Location: Patient: Home Provider: Clinic    I discussed the limitations of evaluation and management by telemedicine and the availability of in person appointments. The patient expressed understanding and agreed to proceed.  I discussed the assessment and treatment plan with the patient. The patient was provided an opportunity to ask questions and all were answered. The patient agreed with the plan and demonstrated an understanding of the instructions.   The patient was advised to call back or seek an in-person evaluation if the symptoms worsen or if the condition fails to improve as anticipated.   In accordance with CMS guidelines, patient has met eligibility criteria including age, absence of signs or symptoms of lung cancer.  Social History   Tobacco Use  . Smoking status: Current Every Day Smoker    Packs/day: 1.00    Years: 50.00    Pack years: 50.00    Types: Cigarettes  . Smokeless tobacco: Never Used  . Tobacco comment: smokes 1/2 pack per day 11/19/2019  Vaping Use  . Vaping Use: Never used  Substance Use Topics  . Alcohol use: No    Alcohol/week: 0.0 standard drinks  . Drug use: No      A shared decision-making session was conducted prior to the performance of CT scan. This includes one or more decision aids, includes benefits and harms of screening, follow-up diagnostic testing, over-diagnosis, false positive rate, and total radiation exposure.   Counseling on the importance of adherence to annual lung cancer LDCT screening, impact of co-morbidities, and ability or willingness to undergo diagnosis and treatment is imperative for compliance of the program.   Counseling on the importance of continued smoking cessation for former smokers; the importance of smoking  cessation for current smokers, and information about tobacco cessation interventions have been given to patient including [facility] [facility] and [facility] quit [place] programs.   Written order for lung cancer screening with LDCT has been given to the patient and any and all questions have been answered to the best of my abilities.    Yearly follow up will be coordinated by Burgess Estelle, Thoracic Navigator.  I provided 15 minutes of face-to-face video visit time during this encounter, and > 50% was spent counseling as documented under my assessment & plan.   Jacquelin Hawking, NP

## 2020-01-22 LAB — NOVEL CORONAVIRUS, NAA: SARS-CoV-2, NAA: NOT DETECTED

## 2020-01-22 LAB — SPECIMEN STATUS REPORT

## 2020-01-26 ENCOUNTER — Other Ambulatory Visit: Payer: Self-pay

## 2020-01-26 ENCOUNTER — Ambulatory Visit (HOSPITAL_COMMUNITY)
Admission: RE | Admit: 2020-01-26 | Discharge: 2020-01-26 | Disposition: A | Discharge status: Home / Self Care | Payer: Medicare Other | Source: Ambulatory Visit | Attending: Oncology | Admitting: Oncology

## 2020-01-26 DIAGNOSIS — Z122 Encounter for screening for malignant neoplasm of respiratory organs: Secondary | ICD-10-CM | POA: Diagnosis present

## 2020-01-26 DIAGNOSIS — Z87891 Personal history of nicotine dependence: Secondary | ICD-10-CM | POA: Diagnosis present

## 2020-01-28 ENCOUNTER — Encounter (HOSPITAL_COMMUNITY): Payer: Self-pay

## 2020-01-28 NOTE — Progress Notes (Signed)
Patient notified of LDCT Lung Cancer Screening Results via mail with the recommendation to follow-up in 12 months. Patient's referring provider has been sent a copy of results. Results are as follows:   IMPRESSION: 1. Lung-RADS 2, benign appearance or behavior. Continue annual screening with low-dose chest CT without contrast in 12 months. 2. Diffuse bronchial wall thickening with emphysema, as above; imaging findings suggestive of underlying COPD. 3. Coronary artery calcifications. 4. Aortic Atherosclerosis (ICD10-I70.0) and Emphysema (ICD10-J43.9).

## 2020-02-01 ENCOUNTER — Other Ambulatory Visit: Payer: Self-pay

## 2020-02-01 ENCOUNTER — Telehealth: Payer: Self-pay

## 2020-02-01 DIAGNOSIS — J3089 Other allergic rhinitis: Secondary | ICD-10-CM

## 2020-02-01 MED ORDER — FLUTICASONE PROPIONATE 50 MCG/ACT NA SUSP: 2.0000 | Freq: Every day | NASAL | 5 refills | Status: DC

## 2020-02-01 NOTE — Telephone Encounter (Signed)
Med refilled.

## 2020-02-01 NOTE — Telephone Encounter (Signed)
Pt needs her flonase called in to [place]

## 2020-02-16 ENCOUNTER — Encounter: Payer: Self-pay | Admitting: Internal Medicine

## 2020-02-16 ENCOUNTER — Other Ambulatory Visit: Payer: Self-pay

## 2020-02-16 ENCOUNTER — Ambulatory Visit (INDEPENDENT_AMBULATORY_CARE_PROVIDER_SITE_OTHER): Payer: 59 | Admitting: Internal Medicine

## 2020-02-16 VITALS — BP 172/90 | HR 97 | Temp 97.5°F | Ht 64.0 in | Wt 179.0 lb

## 2020-02-16 DIAGNOSIS — J449 Chronic obstructive pulmonary disease, unspecified: Secondary | ICD-10-CM | POA: Diagnosis not present

## 2020-02-16 DIAGNOSIS — I10 Essential (primary) hypertension: Secondary | ICD-10-CM

## 2020-02-16 DIAGNOSIS — E785 Hyperlipidemia, unspecified: Secondary | ICD-10-CM

## 2020-02-16 DIAGNOSIS — I700 Atherosclerosis of aorta: Secondary | ICD-10-CM

## 2020-02-16 MED ORDER — HYDROCHLOROTHIAZIDE 25 MG PO TABS
25.0000 mg | ORAL_TABLET | Freq: Every day | ORAL | 5 refills | Status: DC
Start: 2020-02-16 — End: 2020-03-21

## 2020-02-16 NOTE — Assessment & Plan Note (Signed)
Better since discontinuing Benazepril On Symbicort and Atrovent Albuterol PRN Follows up with Pulmonology

## 2020-02-16 NOTE — Assessment & Plan Note (Signed)
BP Readings from Last 1 Encounters:  02/16/20 (!) 172/90   uncontrolled with Irbesartan Has not started taking HCTZ started by Dr Rae Mar a new prescription Counseled for compliance with the medications Advised DASH diet and moderate exercise/walking, at least 150 mins/week

## 2020-02-16 NOTE — Patient Instructions (Addendum)
Please start taking Hydrochlorothiazide in addition to Avapro.  Please follow low sodium diet for better control of blood pressure.

## 2020-02-16 NOTE — Assessment & Plan Note (Signed)
On Simvastatin 40 mg QD

## 2020-02-16 NOTE — Progress Notes (Signed)
Established Patient Office Visit  Subjective:  Patient ID: [name], female    DOB: 08/25/1945  Age: 75 y.o. MRN: [id number]  CC:  Chief Complaint  Patient presents with  . Follow-up    HPI [name]  is a 75 year old female with past medical history of COPD, hypertension, osteopenia and breast cancer s/p left-sided mastectomy presents for follow up of her chronic medical conditions.  Her BP was elevated today in the office. Of note, there have been multiple changes in her BP medication since her last visit. Her Benazepril and Zebeta have been discontinued by her Pulmonologist. She is started on Irbesartan and HCTZ, but she has not started taking HCTZ. She denies any headache, dizziness, chest pain, dyspnea or palpitations.  Her breathing and cough have improved since discontinuing Benazepril. She denies any dyspnea or worsening cough currently.  Past Medical History:  Diagnosis Date  . Breast cancer ([place]) 2007   Stage I (T1b N0 M0), grade 1 well-differentiated carcinoma of the left breast status, post lumpectomy followed by radiation therapy. Her estrogen receptor receptors were 93%, progesterone receptors 67%. HER-2/neu was negative. No lymphovascular space invasion was seen. All margins were clear. Ki-67 marker was low at 1% with surgery on 11/15/2004. Treated then with post-lumpectomy radiation, finish  . Breast cancer, left breast ([place]) 2007  . COPD (chronic obstructive pulmonary disease) ([place])   . Diabetes mellitus without complication ([place])   . Diverticula of colon   . Hx of rickettsial disease   . Hyperlipidemia   . Hypertension   . Kidney stones   . Nicotine dependence   . Osteoarthritis     Past Surgical History:  Procedure Laterality Date  . ABDOMINAL HYSTERECTOMY    . BREAST SURGERY  2005 approx   left lumpectomy   . CATARACT EXTRACTION W/PHACO Left 05/01/2019   Procedure: CATARACT EXTRACTION PHACO AND INTRAOCULAR LENS PLACEMENT LEFT EYE CDE=11.73;   Surgeon: Baruch Goldmann, MD;  Location: [facility];  Service: Ophthalmology;  Laterality: Left;  left  . CATARACT EXTRACTION W/PHACO Right 05/15/2019   Procedure: CATARACT EXTRACTION PHACO AND INTRAOCULAR LENS PLACEMENT RIGHT EYE;  Surgeon: Baruch Goldmann, MD;  Location: [facility];  Service: Ophthalmology;  Laterality: Right;  CDE: 16.07  . CHOLECYSTECTOMY N/A 05/26/2014   Procedure: LAPAROSCOPIC CHOLECYSTECTOMY;  Surgeon: Aviva Signs Md, MD;  Location: [facility];  Service: General;  Laterality: N/A;  . COLECTOMY     2005, diverticulitis  . COLONOSCOPY N/A 05/20/2017   Procedure: COLONOSCOPY;  Surgeon: Danie Binder, MD;  Location: [facility];  Service: Endoscopy;  Laterality: N/A;  1:45pm  . DILATION AND CURETTAGE OF UTERUS    . ESOPHAGOGASTRODUODENOSCOPY N/A 05/20/2017   Procedure: ESOPHAGOGASTRODUODENOSCOPY (EGD);  Surgeon: Danie Binder, MD;  Location: [facility];  Service: Endoscopy;  Laterality: N/A;  . left breast      cancer, in 2006  . TUBAL LIGATION      Family History  Problem Relation Age of Onset  . Breast cancer Mother   . COPD Father   . Emphysema Father   . Throat cancer Sister   . Glaucoma Brother   . Schizophrenia Brother   . Heart attack Brother   . Lung cancer Sister        former smoker  . Hepatitis C Daughter   . Seizures Daughter   . SIDS Son   . Colon cancer Neg Hx   . Colon polyps Neg Hx     Social History  Socioeconomic History  . Marital status: Married    Spouse name: Not on file  . Number of children: 5  . Years of education: 9th grade   . Highest education level: 9th grade  Occupational History  . Occupation: retired    Associate Professor: RETIRED  Tobacco Use  . Smoking status: Current Every Day Smoker    Packs/day: 1.00    Years: 50.00    Pack years: 50.00    Types: Cigarettes  . Smokeless tobacco: Never Used  . Tobacco comment: smokes 1/2 pack per day 11/19/2019  Vaping Use  . Vaping Use: Never used  Substance and Sexual Activity  . Alcohol  use: No    Alcohol/week: 0.0 standard drinks  . Drug use: No  . Sexual activity: Not Currently  Other Topics Concern  . 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 QUIT WORKING 2003 AFTER CANCER DIAGNOSIS.  MARRIED WITH A RUN AWAY HUSBAND(FUGITIVE 10 YRS). 3 MISCARRIAGES, 5 CHILDREN   Social Determinants of Health   Financial Resource Strain: Low Risk   . Difficulty of Paying Living Expenses: Not hard at all  Food Insecurity: No Food Insecurity  . Worried About Programme researcher, broadcasting/film/video in the Last Year: Never true  . Ran Out of Food in the Last Year: Never true  Transportation Needs: No Transportation Needs  . Lack of Transportation (Medical): No  . Lack of Transportation (Non-Medical): No  Physical Activity: Sufficiently Active  . Days of Exercise per Week: 7 days  . Minutes of Exercise per Session: 60 min  Stress: No Stress Concern Present  . Feeling of Stress : Not at all  Social Connections: Moderately Isolated  . Frequency of Communication with Friends and Family: More than three times a week  . Frequency of Social Gatherings with Friends and Family: Once a week  . Attends Religious Services: Never  . Active Member of Clubs or Organizations: No  . Attends Banker Meetings: Never  . Marital Status: Married  Catering manager Violence: Not At Risk  . Fear of Current or Ex-Partner: No  . Emotionally Abused: No  . Physically Abused: No  . Sexually Abused: No    Outpatient Medications Prior to Visit  Medication Sig Dispense Refill  . acetaminophen (TYLENOL) 325 MG tablet Take 650 mg by mouth every 6 (six) hours as needed for moderate pain.     Marland Kitchen albuterol (PROAIR HFA) 108 (90 Base) MCG/ACT inhaler 2 puffs every 4 hours as needed only  if your can't catch your breath 18 g 1  . albuterol (PROVENTIL) (2.5 MG/3ML) 0.083% nebulizer solution INHALE ONE VIAL VIA NEBULIZER  THREE TIMES DAILY. (Patient taking differently: Take 2.5 mg by nebulization 3 (three) times daily as needed for wheezing or shortness of breath.) 300 mL 0  . aspirin EC 81 MG tablet Take 81 mg by mouth daily.    . budesonide-formoterol (SYMBICORT) 160-4.5 MCG/ACT inhaler Inhale 2 puffs into the lungs 2 (two) times daily. 10.2 g 0  . Calcium Carbonate-Vitamin D (CALCIUM 600 + D PO) Take 1 tablet by mouth 2 (two) times daily. 600/400    . cyclobenzaprine (FLEXERIL) 5 MG tablet TAKE ONE TABLET BY MOUTH AT BEDTIME AS NEEDED. 30 tablet 0  . famotidine (PEPCID) 20 MG tablet Take 20 mg by mouth at bedtime.     . fluticasone (FLONASE) 50 MCG/ACT nasal spray  Place 2 sprays into both nostrils daily. 16 g 5  . ipratropium (ATROVENT HFA) 17 MCG/ACT inhaler INHALE 2 PUFFS BY MOUTH FOUR TIMES A DAY. 12.9 g 0  . irbesartan (AVAPRO) 300 MG tablet Take 1 tablet (300 mg total) by mouth daily. 30 tablet 11  . loratadine (CLARITIN) 10 MG tablet TAKE 1 TABLET BY MOUTH ONCE DAILY FOR ALLERGIES. 30 tablet 0  . montelukast (SINGULAIR) 10 MG tablet TAKE 1 TABLET BY MOUTH AT BEDTIME. 90 tablet 0  . polyethylene glycol powder (GLYCOLAX/MIRALAX) powder MIX 1 CAPFUL IN 8 OUNCES OF JUICE OR WATER AND DRINK ONCE DAILY. (Patient taking differently: Take 17 g by mouth daily.) 527 g 5  . potassium chloride SA (KLOR-CON) 20 MEQ tablet TAKE 1 TABLET BY MOUTH DAILY. 90 tablet 0  . simvastatin (ZOCOR) 40 MG tablet TAKE (1) TABLET BY MOUTH AT BEDTIME. 90 tablet 1  . traMADol (ULTRAM) 50 MG tablet TAKE (1) TABLET BY MOUTH EVERY SIX HOURS AS NEEDED. 60 tablet 2  . azithromycin (ZITHROMAX) 250 MG tablet Take 2 on day one then 1 daily x 4 days (Patient not taking: Reported on 02/16/2020) 6 tablet 0  . calcium-vitamin D (OSCAL WITH D) 500-200 MG-UNIT tablet Take 1 tablet by mouth 2 (two) times daily. (Patient not taking: Reported on 02/16/2020)    . hydrochlorothiazide (HYDRODIURIL) 25 MG tablet Take 1 tablet (25 mg total) by mouth daily.  (Patient not taking: Reported on 02/16/2020) 30 tablet 11  . omeprazole (PRILOSEC) 20 MG capsule Take 20 mg by mouth daily as needed. (Patient not taking: Reported on 02/16/2020)     No facility-administered medications prior to visit.    Allergies  Allergen Reactions  . Penicillins Hives, Shortness Of Breath and Swelling    Has patient had a PCN reaction causing immediate rash, facial/tongue/throat swelling, SOB or lightheadedness with hypotension: yes Has patient had a PCN reaction causing severe rash involving mucus membranes or skin necrosis:no Has patient had a PCN reaction that required hospitalization: yes Has patient had a PCN reaction occurring within the last 10 years: no If all of the above answers are "NO", then may proceed with Cephalosporin use.  . Sulfonamide Derivatives Other (See Comments)    Shaking all over, seizure like symptoms. Hospitalization resulted     ROS Review of Systems  Constitutional: Negative for chills and fever.  HENT: Negative for congestion, ear discharge, rhinorrhea, sinus pressure, sinus pain and sore throat.   Eyes: Negative for pain and discharge.  Respiratory: Positive for cough (chronic). Negative for shortness of breath.   Cardiovascular: Negative for chest pain and palpitations.  Gastrointestinal: Negative for abdominal pain, constipation, diarrhea, nausea and vomiting.  Endocrine: Negative for polydipsia and polyuria.  Genitourinary: Negative for dysuria and hematuria.  Musculoskeletal: Negative for neck pain and neck stiffness.  Skin: Negative for rash.  Neurological: Negative for dizziness and weakness.  Psychiatric/Behavioral: Negative for agitation and behavioral problems.      Objective:    Physical Exam Vitals reviewed.  Constitutional:      General: She is not in acute distress.    Appearance: She is not diaphoretic.  HENT:     Head: Normocephalic and atraumatic.     Nose: Nose normal. No congestion.     Mouth/Throat:      Mouth: Mucous membranes are moist.     Pharynx: No posterior oropharyngeal erythema.  Eyes:     General: No scleral icterus.    Extraocular Movements: Extraocular movements intact.  Pupils: Pupils are equal, round, and reactive to light.  Cardiovascular:     Rate and Rhythm: Normal rate and regular rhythm.     Heart sounds: Normal heart sounds. No murmur heard.   Pulmonary:     Breath sounds: Normal breath sounds. No wheezing or rales.  Musculoskeletal:     Cervical back: Neck supple. No rigidity or tenderness.     Right lower leg: No edema.     Left lower leg: No edema.  Skin:    General: Skin is warm.     Findings: No rash.  Neurological:     General: No focal deficit present.     Mental Status: She is alert and oriented to person, place, and time.     Sensory: No sensory deficit.     Motor: No weakness.  Psychiatric:        Mood and Affect: Mood normal.        Behavior: Behavior normal.      BP (!) 172/90 (BP Location: Left Arm, Patient Position: Sitting, Cuff Size: Normal)   Pulse 97   Temp (!) 97.5 F (36.4 C) (Temporal)   Ht $R'5\' 4"'aI$  (1.626 m)   Wt 179 lb (81.2 kg)   SpO2 97%   BMI 30.73 kg/m  Wt Readings from Last 3 Encounters:  02/16/20 179 lb (81.2 kg)  12/29/19 179 lb (81.2 kg)  12/04/19 177 lb (80.3 kg)     Health Maintenance Due  Topic Date Due  . COVID-19 Vaccine (3 - Pfizer risk 4-dose series) 05/20/2019    There are no preventive care reminders to display for this patient.  Lab Results  Component Value Date   TSH 1.07 09/25/2019   Lab Results  Component Value Date   WBC 11.3 (H) 10/13/2019   HGB 14.6 10/13/2019   HCT 46.4 (H) 10/13/2019   MCV 92.8 10/13/2019   PLT 419 (H) 10/13/2019   Lab Results  Component Value Date   NA 140 10/13/2019   K 4.1 10/13/2019   CO2 27 10/13/2019   GLUCOSE 107 (H) 10/13/2019   BUN 18 10/13/2019   CREATININE 0.74 10/13/2019   BILITOT 0.7 10/13/2019   ALKPHOS 68 10/13/2019   AST 17 10/13/2019   ALT  16 10/13/2019   PROT 7.7 10/13/2019   ALBUMIN 4.3 10/13/2019   CALCIUM 9.9 10/13/2019   ANIONGAP 10 10/13/2019   Lab Results  Component Value Date   CHOL 171 09/25/2019   Lab Results  Component Value Date   HDL 74 09/25/2019   Lab Results  Component Value Date   LDLCALC 74 09/25/2019   Lab Results  Component Value Date   TRIG 148 09/25/2019   Lab Results  Component Value Date   CHOLHDL 2.3 09/25/2019   Lab Results  Component Value Date   HGBA1C 5.9 (H) 04/28/2019      Assessment & Plan:   Problem List Items Addressed This Visit      Cardiovascular and Mediastinum   HTN (hypertension) - Primary    BP Readings from Last 1 Encounters:  02/16/20 (!) 172/90   uncontrolled with Irbesartan Has not started taking HCTZ started by Dr Rae Mar a new prescription Counseled for compliance with the medications Advised DASH diet and moderate exercise/walking, at least 150 mins/week       Relevant Medications   hydrochlorothiazide (HYDRODIURIL) 25 MG tablet   Other Relevant Orders   CBC with Differential   CMP14+EGFR   TSH   Aortic atherosclerosis ([place])  On Aspirin and statin      Relevant Medications   hydrochlorothiazide (HYDRODIURIL) 25 MG tablet     Respiratory   Asthmatic bronchitis , chronic ([facility])    Better since discontinuing Benazepril On Symbicort and Atrovent Albuterol PRN Follows up with Pulmonology        Other   Hyperlipidemia LDL goal <100    On Simvastatin 40 mg QD      Relevant Medications   hydrochlorothiazide (HYDRODIURIL) 25 MG tablet   Other Relevant Orders   Hemoglobin A1c   Lipid panel    Other Visit Diagnoses    Hypertension goal BP (blood pressure) < 130/80       Relevant Medications   hydrochlorothiazide (HYDRODIURIL) 25 MG tablet      Meds ordered this encounter  Medications  . hydrochlorothiazide (HYDRODIURIL) 25 MG tablet    Sig: Take 1 tablet (25 mg total) by mouth daily.    Dispense:  30 tablet    Refill:   5    Follow-up: Return in about 6 weeks (around 03/29/2020) for Blood pressure follow up and review of blood tests.    Lindell Spar, MD

## 2020-02-16 NOTE — Assessment & Plan Note (Signed)
On Aspirin and statin

## 2020-02-17 ENCOUNTER — Telehealth: Payer: Self-pay | Admitting: Internal Medicine

## 2020-02-17 ENCOUNTER — Other Ambulatory Visit: Payer: Self-pay | Admitting: Internal Medicine

## 2020-02-17 DIAGNOSIS — R918 Other nonspecific abnormal finding of lung field: Secondary | ICD-10-CM

## 2020-02-17 DIAGNOSIS — J449 Chronic obstructive pulmonary disease, unspecified: Secondary | ICD-10-CM

## 2020-02-17 LAB — HEMOGLOBIN A1C
Est. average glucose Bld gHb Est-mCnc: 131 mg/dL
Hgb A1c MFr Bld: 6.2 % — ABNORMAL HIGH (ref 4.8–5.6)

## 2020-02-17 LAB — CBC WITH DIFFERENTIAL/PLATELET
Basophils Absolute: 0.1 10*3/uL (ref 0.0–0.2)
Basos: 1 %
EOS (ABSOLUTE): 0.4 10*3/uL (ref 0.0–0.4)
Eos: 3 %
Hematocrit: 45 % (ref 34.0–46.6)
Hemoglobin: 14.8 g/dL (ref 11.1–15.9)
Immature Grans (Abs): 0 10*3/uL (ref 0.0–0.1)
Immature Granulocytes: 0 %
Lymphocytes Absolute: 2.9 10*3/uL (ref 0.7–3.1)
Lymphs: 23 %
MCH: 28.5 pg (ref 26.6–33.0)
MCHC: 32.9 g/dL (ref 31.5–35.7)
MCV: 87 fL (ref 79–97)
Monocytes Absolute: 1.5 10*3/uL — ABNORMAL HIGH (ref 0.1–0.9)
Monocytes: 12 %
Neutrophils Absolute: 7.5 10*3/uL — ABNORMAL HIGH (ref 1.4–7.0)
Neutrophils: 61 %
Platelets: 408 10*3/uL (ref 150–450)
RBC: 5.2 x10E6/uL (ref 3.77–5.28)
RDW: 14.1 % (ref 11.7–15.4)
WBC: 12.3 10*3/uL — ABNORMAL HIGH (ref 3.4–10.8)

## 2020-02-17 LAB — CMP14+EGFR
ALT: 15 IU/L (ref 0–32)
AST: 18 IU/L (ref 0–40)
Albumin/Globulin Ratio: 1.5 (ref 1.2–2.2)
Albumin: 4 g/dL (ref 3.7–4.7)
Alkaline Phosphatase: 84 IU/L (ref 44–121)
BUN/Creatinine Ratio: 19 (ref 12–28)
BUN: 19 mg/dL (ref 8–27)
Bilirubin Total: 0.2 mg/dL (ref 0.0–1.2)
CO2: 25 mmol/L (ref 20–29)
Calcium: 9.6 mg/dL (ref 8.7–10.3)
Chloride: 104 mmol/L (ref 96–106)
Creatinine, Ser: 0.98 mg/dL (ref 0.57–1.00)
GFR calc Af Amer: 65 mL/min/{1.73_m2} (ref >59)
GFR calc non Af Amer: 57 mL/min/{1.73_m2} — ABNORMAL LOW (ref >59)
Globulin, Total: 2.7 g/dL (ref 1.5–4.5)
Glucose: 118 mg/dL — ABNORMAL HIGH (ref 65–99)
Potassium: 4.5 mmol/L (ref 3.5–5.2)
Sodium: 143 mmol/L (ref 134–144)
Total Protein: 6.7 g/dL (ref 6.0–8.5)

## 2020-02-17 LAB — LIPID PANEL
Chol/HDL Ratio: 2.8 ratio (ref 0.0–4.4)
Cholesterol, Total: 180 mg/dL (ref 100–199)
HDL: 64 mg/dL (ref >39)
LDL Chol Calc (NIH): 82 mg/dL (ref 0–99)
Triglycerides: 208 mg/dL — ABNORMAL HIGH (ref 0–149)
VLDL Cholesterol Cal: 34 mg/dL (ref 5–40)

## 2020-02-17 LAB — TSH: TSH: 1.83 u[IU]/mL (ref 0.450–4.500)

## 2020-02-17 NOTE — Telephone Encounter (Signed)
Pt returning a phone call. Pt can be reached at [phone].

## 2020-02-17 NOTE — Telephone Encounter (Signed)
Centrum A to Z best bet but this kind of question should be addressed by PCP at regular f/u ov's

## 2020-02-17 NOTE — Telephone Encounter (Signed)
Attempted to call pt. Left message for pt to call back.  

## 2020-02-17 NOTE — Telephone Encounter (Signed)
Assessment & Plan Note by Tanda Rockers, MD at 12/29/2019 12:38 PM  Author: Tanda Rockers, MD Author Type: Physician Filed: 12/29/2019 12:40 PM  Note Status: Written Cosign: Cosign Not Required Encounter Date: 12/29/2019  Problem: Asthmatic bronchitis , chronic ([place])  Editor: Tanda Rockers, MD (Physician)               Active smoker  - PFT's  03/03/2012  FEV1 1.69 (69 % ) ratio 0.72  p 5 % improvement from saba p ? prior to study with DLCO  17.42 (68%) corrects to 4.53 (92%)  for alv volume and FV curve min curvature/ ERV 37%   - try off acei 11/19/2019 and return for medication reconciliation in 6 weeks  Some better then flare and still has clear lungs on exam but very poor hfa  - The proper method of use, as well as anticipated side effects, of a metered-dose inhaler were discussed and demonstrated to the patient using teach back method. Improved effectiveness after extensive coaching during this visit to a level of approximately 50 % from a baseline of 25 % > suggested she use empty symbicort cannister for practice use.  Also for flare:  Zpak/mucinex  no more pred and if cough continues consider dx of Upper airway cough syndrome (previously labeled PNDS),  is so named because it's frequently impossible to sort out how much is  CR/sinusitis with freq throat clearing (which can be related to primary GERD)   vs  causing  secondary (" extra esophageal")  GERD from wide swings in gastric pressure that occur with throat clearing, often  promoting self use of mint and menthol lozenges that reduce the lower esophageal sphincter tone and exacerbate the problem further in a cyclical fashion.   These are the same pts (now being labeled as having "irritable larynx syndrome" by some cough centers) who not infrequently have a history of having failed to tolerate ace inhibitors,  dry powder inhalers or biphosphonates or report having atypical/extraesophageal reflux symptoms that don't respond to  standard doses of PPI  and are easily confused as having aecopd or asthma flares by even experienced allergists/ pulmonologists (myself included).   >>> rx will be for gerd next if not better.        Patient Instructions by Tanda Rockers, MD at 12/29/2019 11:30 AM  Author: Tanda Rockers, MD Author Type: Physician Filed: 12/29/2019 12:09 PM  Note Status: Addendum Cosign: Cosign Not Required Encounter Date: 12/29/2019  Editor: Tanda Rockers, MD (Physician)      Prior Versions: 1. Tanda Rockers, MD (Physician) at 12/29/2019 12:07 PM - Signed     Continue Irbesartan 300 mg one daily plus add  hydrodiuril (hydrochlorothiazide) 25 mg one daily   zpak should changes mucus brown to clear   The key is to stop smoking completely before smoking completely stops you!  For cough > mucinex 1200 mg every 12 hours as needed (over the counter)   Plan A = Automatic = Always=    symbicort 160 Take 2 puffs first thing in am and then another 2 puffs about 12 hours later atrovent is 2 puffs four times daily   Work on inhaler technique:  relax and gently blow all the way out then take a nice smooth deep breath back in, triggering the inhaler at same time you start breathing in.  Hold for up to 5 seconds if you can. Blow out thru nose. Rinse and gargle with water when  done      Plan B = Backup (to supplement plan A, not to replace it) Only use your albuterol inhaler as a rescue medication to be used if you can't catch your breath by resting or doing a relaxed purse lip breathing pattern.  - The less you use it, the better it will work when you need it. - Ok to use the inhaler up to 2 puffs  every 4 hours if you must but call for appointment if use goes up over your usual need - Don't leave home without it !!  (think of it like the spare tire for your car)   Plan C = Crisis (instead of Plan B but only if Plan B stops working) - only use your albuterol nebulizer if you first try Plan B and  it fails to help > ok to use the nebulizer up to every 4 hours but if start needing it regularly call for immediate appointment   Please schedule a follow up office visit in 6 weeks with pfts on return       Called and spoke with pt. Pt stated that she was wanting to know what vitamins MW recommended her to take.  Dr. Melvyn Novas, please advise.

## 2020-02-18 NOTE — Telephone Encounter (Signed)
Called and spoke with pt letting her know the info stated by MW and she verbalized understanding. Nothing further needed. 

## 2020-03-01 ENCOUNTER — Other Ambulatory Visit: Payer: Self-pay

## 2020-03-01 ENCOUNTER — Ambulatory Visit (INDEPENDENT_AMBULATORY_CARE_PROVIDER_SITE_OTHER): Payer: 59 | Admitting: Orthopaedic Surgery

## 2020-03-01 ENCOUNTER — Encounter: Payer: Self-pay | Admitting: Orthopaedic Surgery

## 2020-03-01 ENCOUNTER — Telehealth: Payer: Self-pay | Admitting: Internal Medicine

## 2020-03-01 DIAGNOSIS — M25561 Pain in right knee: Secondary | ICD-10-CM

## 2020-03-01 DIAGNOSIS — G8929 Other chronic pain: Secondary | ICD-10-CM | POA: Diagnosis not present

## 2020-03-01 DIAGNOSIS — F17210 Nicotine dependence, cigarettes, uncomplicated: Secondary | ICD-10-CM

## 2020-03-01 MED ORDER — AZITHROMYCIN 250 MG PO TABS: 250.0000 mg | ORAL_TABLET | ORAL | 0 refills | Status: DC

## 2020-03-01 MED ORDER — TRAMADOL HCL 50 MG PO TABS: ORAL_TABLET | ORAL | 2 refills | Status: DC

## 2020-03-01 NOTE — Progress Notes (Signed)
PROCEDURE NOTE:  The patient requests injections of the right knee , verbal consent was obtained.  The right knee was prepped appropriately after time out was performed.   Sterile technique was observed and injection of 1 cc of Celestone 6 mg with several cc's of plain xylocaine. Anesthesia was provided by ethyl chloride and a 20-gauge needle was used to inject the knee area. The injection was tolerated well.  A band aid dressing was applied.  The patient was advised to apply ice later today and tomorrow to the injection sight as needed.  Return in three months.   I have reviewed the [facility] web site prior to prescribing narcotic medicine for this patient.   Call if any problem.  Precautions discussed.   Electronically Signed Sanjuana Kava, MD 2/15/20221:39 PM

## 2020-03-01 NOTE — Telephone Encounter (Signed)
Spoke with the pt and notified of response per Dr Melvyn Novas  She verbalized understanding  Nothing further needed  Rx sent to pharm and televisit

## 2020-03-01 NOTE — Telephone Encounter (Signed)
Zpak/ mucinex dm 1200 mg bid prn congestion/ cough   televist in a day or two to check on her and might check another covid test then as well as not boosted

## 2020-03-01 NOTE — Patient Instructions (Signed)

## 2020-03-01 NOTE — Telephone Encounter (Signed)
Called and spoke with pt who states she started having symptoms including cough with yellow-green phlegm, headache, and nasal congestion which began 3 days ago.  Pt stated that she did a home covid test 2/12 which the results came back negative.  Asked pt if she has had any fever and she states that she is unsure but also stated that she does take tylenol and tramadol every 6 hours to help with pain in her right hip.  Pt has not had to use her nebulizer yet. Pt states she has used her rescue inhaler at least twice daily. Pt also is using her Symbicort and Atrovent inhalers as prescribed.  Pt has been taking OTC Equate Tussin DM which she said has helped some.  Pt wants to know if there is anything else that could be recommended.  Dr. Melvyn Novas, please advise.   Patient Instructions by Tanda Rockers, MD at 12/29/2019 11:30 AM  Author: Tanda Rockers, MD Author Type: Physician Filed: 12/29/2019 12:09 PM  Note Status: Addendum Cosign: Cosign Not Required Encounter Date: 12/29/2019  Editor: Tanda Rockers, MD (Physician)      Prior Versions: 1. Tanda Rockers, MD (Physician) at 12/29/2019 12:07 PM - Signed     Continue Irbesartan 300 mg one daily plus add  hydrodiuril (hydrochlorothiazide) 25 mg one daily   zpak should changes mucus brown to clear   The key is to stop smoking completely before smoking completely stops you!  For cough > mucinex 1200 mg every 12 hours as needed (over the counter)   Plan A = Automatic = Always=    symbicort 160 Take 2 puffs first thing in am and then another 2 puffs about 12 hours later atrovent is 2 puffs four times daily   Work on inhaler technique:  relax and gently blow all the way out then take a nice smooth deep breath back in, triggering the inhaler at same time you start breathing in.  Hold for up to 5 seconds if you can. Blow out thru nose. Rinse and gargle with water when done      Plan B = Backup (to supplement plan A, not to replace  it) Only use your albuterol inhaler as a rescue medication to be used if you can't catch your breath by resting or doing a relaxed purse lip breathing pattern.  - The less you use it, the better it will work when you need it. - Ok to use the inhaler up to 2 puffs  every 4 hours if you must but call for appointment if use goes up over your usual need - Don't leave home without it !!  (think of it like the spare tire for your car)   Plan C = Crisis (instead of Plan B but only if Plan B stops working) - only use your albuterol nebulizer if you first try Plan B and it fails to help > ok to use the nebulizer up to every 4 hours but if start needing it regularly call for immediate appointment   Please schedule a follow up office visit in 6 weeks with pfts on return

## 2020-03-03 ENCOUNTER — Other Ambulatory Visit: Payer: Self-pay

## 2020-03-03 ENCOUNTER — Ambulatory Visit (INDEPENDENT_AMBULATORY_CARE_PROVIDER_SITE_OTHER): Payer: 59 | Admitting: Internal Medicine

## 2020-03-03 ENCOUNTER — Encounter: Payer: Self-pay | Admitting: Internal Medicine

## 2020-03-03 DIAGNOSIS — J449 Chronic obstructive pulmonary disease, unspecified: Secondary | ICD-10-CM | POA: Diagnosis not present

## 2020-03-03 DIAGNOSIS — F17210 Nicotine dependence, cigarettes, uncomplicated: Secondary | ICD-10-CM

## 2020-03-03 NOTE — Assessment & Plan Note (Signed)
Active smoker  - PFT's  03/03/2012  FEV1 1.69 (69 % ) ratio 0.72  p 5 % improvement from saba p ? prior to study with DLCO  17.42 (68%) corrects to 4.53 (92%)  for alv volume and FV curve min curvature/ ERV 37%   - try off acei 11/19/2019 and return for medication reconciliation in 6 weeks  Was better now flared x 2/13 with "head cold" and due back 03/16/20 to f/u AB   In meantime reviwed ABC plan and rec complete zpak

## 2020-03-03 NOTE — Progress Notes (Addendum)
[name], female    DOB: 08/03/1945, 75 y.o.   MRN: [id number]   Brief patient profile:  49 yowf active smoker with new onset doe around 2014 dx as copd (though PFTs 03/03/12 did not confirm) and referred to pulmonary clinic in [facility]  11/19/2019 by Dr    Moshe Cipro for refractory cough and sob     History of Present Illness  11/19/2019  Pulmonary/ 1st office eval/ [place] / [place] on symbicort 160/atrovent Chief Complaint  Patient presents with  . Consult    sinus congestion for about a 1-2 weeks  Dyspnea:  Goal mb and back 100 ft /  Variably sob shopping / not using [facility] parking  Cough: sporadic  Sleep: fine / some worse cough in am SABA use: not clear  rec Benazapril 40 mg  > stop this and replace with ibesartan 300 mg one daily in its place  Plan A = Automatic = Always=    symbicort 160 Take 2 puffs first thing in am and then another 2 puffs about 12 hours later.  Work on inhaler technique:   Plan B = Backup (to supplement plan A, not to replace it) Only use your albuterol inhaler as a rescue medication  Plan C = Crisis (instead of Plan B but only if Plan B stops working) - only use your albuterol nebulizer if you first try Plan B and it fails to help  The key is to stop smoking completely before smoking completely stops you! Please schedule a follow up office visit in 6 weeks, call sooner if needed with all medications     12/29/2019  f/u ov/[place] office/[facility] re: doe/ brown mucus x one week already took medrol dose pack p televisit with pcp  Chief Complaint  Patient presents with  . Follow-up    Still bothered by sinus congestion. She is c/o cough with brown sputum over the past wk. She is using the albuterol inhaler about 3 x per wk.   Asthmatic bronchitis, chronic (HCC) Active smoker  - PFT's  03/03/2012  FEV1 1.69 (69 % ) ratio 0.72  p 5 % improvement from saba p ? prior to study with DLCO  17.42 (68%) corrects to 4.53 (92%)  for alv volume and  FV curve min curvature/ ERV 37%   - try off acei 11/19/2019 and return for medication reconciliation in 6 weeks  Hypertension goal BP (blood pressure) < 130/80  Dyspnea:  Better though very inactive Cough:  X one week worse as above   SABA use: very poor insight into ABC plan, could not articulate any part of it even with prompting  02: none  rec Continue Irbesartan 300 mg one daily plus add  hydrodiuril (hydrochlorothiazide) 25 mg one daily  zpak should changes mucus brown to clear  The key is to stop smoking completely before smoking completely stops you! For cough > mucinex 1200 mg every 12 hours as needed (over the counter) Plan A = Automatic = Always=    symbicort 160 Take 2 puffs first thing in am and then another 2 puffs about 12 hours later atrovent is 2 puffs four times daily  Work on inhaler technique Plan B = Backup (to supplement plan A, not to replace it) Only use your albuterol inhaler as a rescue medication Plan C = Crisis (instead of Plan B but only if Plan B stops working) - only use your albuterol nebulizer if you first try Plan B and it fails to  help > ok to use the nebulizer up to every 4 hours but if start needing it regularly call for immediate appointment        Virtual Visit via Telephone Note 03/03/2020   I connected with [name] on 03/03/20 at  9:18 AM EST by telephone and verified that I am speaking with the correct person using two identifiers. Pt is at home and this call made from my office with no other participants    I discussed the limitations, risks, security and privacy concerns of performing an evaluation and management service by telephone and the availability of in person appointments. I also discussed with the patient that there may be a patient responsible charge related to this service. The patient expressed understanding and agreed to proceed.   History of Present Illness: AB/ started zpak 03/02/20/mucinex dm  Dyspnea:  Better until  2/13   Assoc with nasal congestion Cough: mucus yellow 2/13  Sleeping: able to lie flat bed/ up 4 to pillows under head  SABA use: minimal / not needed neb since last ov  02: none    No obvious day to day or daytime variability or assoc  r mucus plugs or hemoptysis or cp or chest tightness, subjective wheeze or overt sinus or hb symptoms.    Also denies any obvious fluctuation of symptoms with weather or environmental changes or other aggravating or alleviating factors except as outlined above.   Meds reviewed/ med reconciliation completed     No outpatient medications have been marked as taking for the 03/03/20 encounter (Appointment) with Tanda Rockers, MD.         Observations/Objective: Congested cough/ rattle typical of smoker/ no conversational sob   Assessment and Plan: See problem list for active a/p's   Follow Up Instructions: See avs for instructions unique to this ov which includes revised/ updated med list     I discussed the assessment and treatment plan with the patient. The patient was provided an opportunity to ask questions and all were answered. The patient agreed with the plan and demonstrated an understanding of the instructions.   The patient was advised to call back or seek an in-person evaluation if the symptoms worsen or if the condition fails to improve as anticipated.  I provided 22  minutes of non-face-to-face time during this encounter.   Christinia Gully, MD                 Past Medical History:  Diagnosis Date  . Breast cancer ([place]) 2007   Stage I (T1b N0 M0), grade 1 well-differentiated carcinoma of the left breast status, post lumpectomy followed by radiation therapy. Her estrogen receptor receptors were 93%, progesterone receptors 67%. HER-2/neu was negative. No lymphovascular space invasion was seen. All margins were clear. Ki-67 marker was low at 1% with surgery on 11/15/2004. Treated then with post-lumpectomy radiation, finish  . Breast cancer,  left breast ([place]) 2007  . COPD (chronic obstructive pulmonary disease) ([place])   . Diabetes mellitus without complication ([place])   . Diverticula of colon   . Hx of rickettsial disease   . Hyperlipidemia   . Hypertension   . Kidney stones   . Nicotine dependence   . Osteoarthritis

## 2020-03-03 NOTE — Assessment & Plan Note (Signed)
Counseled re importance of smoking cessation but did not meet time criteria for separate billing     Each maintenance medication was reviewed in detail including most importantly the difference between maintenance and as needed and under what circumstances the prns are to be used.  Please see AVS for specific  Instructions which are unique to this visit and I personally typed out  which were reviewed in detail over the phone with the patient and a copy provided via mail

## 2020-03-03 NOTE — Patient Instructions (Signed)
Complete your Zpak  Follow the ABC plan from your last visit   Keep the follow up plan  with all medications /inhalers/ solutions in hand so we can verify exactly what you are taking. This includes all medications from all doctors and over the [place] separate them into two bags:  the ones you take automatically, no matter what, vs the ones you take just when you feel you need them "BAG #2 is UP TO YOU"  - this will really help [place] help you take your medications more effectively.

## 2020-03-04 ENCOUNTER — Ambulatory Visit: Payer: 59 | Admitting: Internal Medicine

## 2020-03-16 ENCOUNTER — Ambulatory Visit: Payer: 59 | Admitting: Internal Medicine

## 2020-03-16 NOTE — Progress Notes (Deleted)
[name], female    DOB: 09/26/1945    MRN: [id number]   Brief patient profile:  77 yowf active smoker with new onset doe around 2014 dx as copd (though PFTs 03/03/12 did not confirm) and referred to pulmonary clinic in [facility]  11/19/2019 by Dr    Moshe Cipro for refractory cough and sob     History of Present Illness  11/19/2019  Pulmonary/ 1st office eval/ [name] / [place] on symbicort 160/atrovent Chief Complaint  Patient presents with  . Consult    sinus congestion for about a 1-2 weeks  Dyspnea:  Goal mb and back 100 ft /  Variably sob shopping / not using [facility] parking  Cough: sporadic  Sleep: fine / some worse cough in am SABA use: not clear  rec Benazapril 40 mg  > stop this and replace with ibesartan 300 mg one daily in its place  Plan A = Automatic = Always=    symbicort 160 Take 2 puffs first thing in am and then another 2 puffs about 12 hours later.  Work on inhaler technique:   Plan B = Backup (to supplement plan A, not to replace it) Only use your albuterol inhaler as a rescue medication  Plan C = Crisis (instead of Plan B but only if Plan B stops working) - only use your albuterol nebulizer if you first try Plan B and it fails to help  The key is to stop smoking completely before smoking completely stops you! Please schedule a follow up office visit in 6 weeks, call sooner if needed with all medications     12/29/2019  f/u ov/[place] office/[name] re: doe/ brown mucus x one week already took medrol dose pack p televisit with pcp  Chief Complaint  Patient presents with  . Follow-up    Still bothered by sinus congestion. She is c/o cough with brown sputum over the past wk. She is using the albuterol inhaler about 3 x per wk.   Asthmatic bronchitis, chronic (HCC) Active smoker  - PFT's  03/03/2012  FEV1 1.69 (69 % ) ratio 0.72  p 5 % improvement from saba p ? prior to study with DLCO  17.42 (68%) corrects to 4.53 (92%)  for alv volume and FV curve  min curvature/ ERV 37%   - try off acei 11/19/2019 and return for medication reconciliation in 6 weeks  Hypertension goal BP (blood pressure) < 130/80  Dyspnea:  Better though very inactive Cough:  X one week worse as above   SABA use: very poor insight into ABC plan, could not articulate any part of it even with prompting  02: none  rec Continue Irbesartan 300 mg one daily plus add  hydrodiuril (hydrochlorothiazide) 25 mg one daily  zpak should changes mucus brown to clear  The key is to stop smoking completely before smoking completely stops you! For cough > mucinex 1200 mg every 12 hours as needed (over the counter) Plan A = Automatic = Always=    symbicort 160 Take 2 puffs first thing in am and then another 2 puffs about 12 hours later atrovent is 2 puffs four times daily  Work on inhaler technique:  Plan B = Backup (to supplement plan A, not to replace it) Only use your albuterol inhaler as a rescue medication to be used if you can't catch your breath  Plan C = Crisis (instead of Plan B but only if Plan B stops working) - only use your albuterol nebulizer  if you first try Plan B and it fails to help > ok to use the nebulizer up to every 4 hours but if start needing it regularly call for immediate appointment Please schedule a follow up office visit in 6 weeks with pfts on return     03/16/2020  f/u ov/[name] re:  No chief complaint on file.   Dyspnea:  *** Cough: *** Sleeping: *** SABA use: *** 02: *** Covid status:   ***   No obvious day to day or daytime variability or assoc excess/ purulent sputum or mucus plugs or hemoptysis or cp or chest tightness, subjective wheeze or overt sinus or hb symptoms.   *** without nocturnal  or early am exacerbation  of respiratory  c/o's or need for noct saba. Also denies any obvious fluctuation of symptoms with weather or environmental changes or other aggravating or alleviating factors except as outlined above   No unusual exposure hx or h/o  childhood pna/ asthma or knowledge of premature birth.  Current Allergies, Complete Past Medical History, Past Surgical History, Family History, and Social History were reviewed in [employer] record.  ROS  The following are not active complaints unless bolded Hoarseness, sore throat, dysphagia, dental problems, itching, sneezing,  nasal congestion or discharge of excess mucus or purulent secretions, ear ache,   fever, chills, sweats, unintended wt loss or wt gain, classically pleuritic or exertional cp,  orthopnea pnd or arm/hand swelling  or leg swelling, presyncope, palpitations, abdominal pain, anorexia, nausea, vomiting, diarrhea  or change in bowel habits or change in bladder habits, change in stools or change in urine, dysuria, hematuria,  rash, arthralgias, visual complaints, headache, numbness, weakness or ataxia or problems with walking or coordination,  change in mood or  memory.        No outpatient medications have been marked as taking for the 03/16/20 encounter (Appointment) with Tanda Rockers, MD.                   Past Medical History:  Diagnosis Date  . Breast cancer ([place]) 2007   Stage I (T1b N0 M0), grade 1 well-differentiated carcinoma of the left breast status, post lumpectomy followed by radiation therapy. Her estrogen receptor receptors were 93%, progesterone receptors 67%. HER-2/neu was negative. No lymphovascular space invasion was seen. All margins were clear. Ki-67 marker was low at 1% with surgery on 11/15/2004. Treated then with post-lumpectomy radiation, finish  . Breast cancer, left breast ([place]) 2007  . COPD (chronic obstructive pulmonary disease) ([place])   . Diabetes mellitus without complication ([place])   . Diverticula of colon   . Hx of rickettsial disease   . Hyperlipidemia   . Hypertension   . Kidney stones   . Nicotine dependence   . Osteoarthritis        Objective:      03/16/2020         ***  12/29/19 179 lb (81.2 kg)   12/04/19 177 lb (80.3 kg)  11/19/19 177 lb 3.2 oz (80.4 kg)     Vital signs reviewed  03/16/2020  - Note at rest 02 sats  ***% on ***   General appearance:    ***        Min bar***         Assessment

## 2020-03-21 ENCOUNTER — Other Ambulatory Visit: Payer: Self-pay | Admitting: Internal Medicine

## 2020-03-21 ENCOUNTER — Ambulatory Visit: Payer: 59 | Admitting: Family Medicine

## 2020-03-21 ENCOUNTER — Other Ambulatory Visit: Payer: Self-pay | Admitting: Family Medicine

## 2020-03-21 DIAGNOSIS — J449 Chronic obstructive pulmonary disease, unspecified: Secondary | ICD-10-CM

## 2020-03-21 DIAGNOSIS — R918 Other nonspecific abnormal finding of lung field: Secondary | ICD-10-CM

## 2020-03-21 DIAGNOSIS — I10 Essential (primary) hypertension: Secondary | ICD-10-CM

## 2020-03-24 ENCOUNTER — Telehealth: Payer: Self-pay | Admitting: Internal Medicine

## 2020-03-24 ENCOUNTER — Ambulatory Visit: Payer: 59 | Admitting: Internal Medicine

## 2020-03-24 MED ORDER — DOXYCYCLINE HYCLATE 100 MG PO TABS: 100.0000 mg | ORAL_TABLET | Freq: Two times a day (BID) | ORAL | 0 refills | Status: AC

## 2020-03-24 NOTE — Telephone Encounter (Signed)
Called and spoke to pt. Pt was scripted a zpak on 2.15.2022 and states her symptoms never resolved. Pt states she completed the zpak and helped slightly but didn't resolve her s/s. After completing the zpak her s/s fully returned.  Pt c/o sinus congestion, left ear, frontal and maxillary sinus pressure, headache, blowing out sinus mucus - yellow and thick, prod cough with yellow mucus, chest tightness, increase in SOB x weeks. Pt denies fever. Pt states she has been taking tramadol for the sinus pressure and isnt helping. Pt state she is vaccinated and boosted against covid. No known covid contacts. Pt last seen on 2.17.2022 by Dr. Melvyn Novas for chronic asthmatic bronchitis.   Dr. Melvyn Novas please advise. Thanks.    Instructions from 2.17.22 visit: Instructions  Complete your Zpak  Follow the ABC plan from your last visit   Keep the follow up plan  with all medications /inhalers/ solutions in hand so we can verify exactly what you are taking. This includes all medications from all doctors and over the [place] separate them into two bags:  the ones you take automatically, no matter what, vs the ones you take just when you feel you need them "BAG #2 is UP TO YOU"  - this will really help [place] help you take your medications more effectively.

## 2020-03-24 NOTE — Telephone Encounter (Signed)
Try doxy 100 mg bid x 10 days and keep ov with me on 3/21 with all active meds in hand

## 2020-03-24 NOTE — Telephone Encounter (Signed)
ATC patient unable to reach LM to call back office (x1)  

## 2020-03-24 NOTE — Progress Notes (Incomplete)
[name], female    DOB: [birth year]    MRN: [id number]   Brief patient profile:  37 yowf active smoker with new onset doe around 2014 dx as copd (though PFTs 03/03/12 did not confirm) and referred to pulmonary clinic in [facility]  11/19/2019 by Dr    Moshe Cipro for refractory cough and sob     History of Present Illness  11/19/2019  Pulmonary/ 1st office eval/ [facility] / [place] on symbicort 160/atrovent Chief Complaint  Patient presents with  . Consult    sinus congestion for about a 1-2 weeks  Dyspnea:  Goal mb and back 100 ft /  Variably sob shopping / not using [facility] parking  Cough: sporadic  Sleep: fine / some worse cough in am SABA use: not clear  rec Benazapril 40 mg  > stop this and replace with ibesartan 300 mg one daily in its place  Plan A = Automatic = Always=    symbicort 160 Take 2 puffs first thing in am and then another 2 puffs about 12 hours later.  Work on inhaler technique:   Plan B = Backup (to supplement plan A, not to replace it) Only use your albuterol inhaler as a rescue medication  Plan C = Crisis (instead of Plan B but only if Plan B stops working) - only use your albuterol nebulizer if you first try Plan B and it fails to help  The key is to stop smoking completely before smoking completely stops you! Please schedule a follow up office visit in 6 weeks, call sooner if needed with all medications     12/29/2019  f/u ov/[place] office/[facility] re: doe/ brown mucus x one week already took medrol dose pack p televisit with pcp  Chief Complaint  Patient presents with  . Follow-up    Still bothered by sinus congestion. She is c/o cough with brown sputum over the past wk. She is using the albuterol inhaler about 3 x per wk.   Asthmatic bronchitis, chronic (HCC) Active smoker  - PFT's  03/03/2012  FEV1 1.69 (69 % ) ratio 0.72  p 5 % improvement from saba p ? prior to study with DLCO  17.42 (68%) corrects to 4.53 (92%)  for alv volume and FV curve  min curvature/ ERV 37%   - try off acei 11/19/2019 and return for medication reconciliation in 6 weeks  Hypertension goal BP (blood pressure) < 130/80  Dyspnea:  Better though very inactive Cough:  X one week worse as above   SABA use: very poor insight into ABC plan, could not articulate any part of it even with prompting  02: none  rec Continue Irbesartan 300 mg one daily plus add  hydrodiuril (hydrochlorothiazide) 25 mg one daily  zpak should changes mucus brown to clear  The key is to stop smoking completely before smoking completely stops you! For cough > mucinex 1200 mg every 12 hours as needed (over the counter) Plan A = Automatic = Always=    symbicort 160 Take 2 puffs first thing in am and then another 2 puffs about 12 hours later atrovent is 2 puffs four times daily  Work on inhaler technique:  Plan B = Backup (to supplement plan A, not to replace it) Only use your albuterol inhaler as a rescue medication to be used if you can't catch your breath  Plan C = Crisis (instead of Plan B but only if Plan B stops working) - only use your albuterol nebulizer  if you first try Plan B and it fails to help > ok to use the nebulizer up to every 4 hours but if start needing it regularly call for immediate appointment Please schedule a follow up office visit in 6 weeks with pfts on return       03/24/2020  f/u ov/[place] office/[facility] re:  No chief complaint on file.   Dyspnea:  *** Cough: *** Sleeping: *** SABA use: *** 02: *** Covid status: *** Lung cancer screening: ***   No obvious day to day or daytime variability or assoc excess/ purulent sputum or mucus plugs or hemoptysis or cp or chest tightness, subjective wheeze or overt sinus or hb symptoms.   *** without nocturnal  or early am exacerbation  of respiratory  c/o's or need for noct saba. Also denies any obvious fluctuation of symptoms with weather or environmental changes or other aggravating or alleviating factors except as  outlined above   No unusual exposure hx or h/o childhood pna/ asthma or knowledge of premature birth.  Current Allergies, Complete Past Medical History, Past Surgical History, Family History, and Social History were reviewed in [employer] record.  ROS  The following are not active complaints unless bolded Hoarseness, sore throat, dysphagia, dental problems, itching, sneezing,  nasal congestion or discharge of excess mucus or purulent secretions, ear ache,   fever, chills, sweats, unintended wt loss or wt gain, classically pleuritic or exertional cp,  orthopnea pnd or arm/hand swelling  or leg swelling, presyncope, palpitations, abdominal pain, anorexia, nausea, vomiting, diarrhea  or change in bowel habits or change in bladder habits, change in stools or change in urine, dysuria, hematuria,  rash, arthralgias, visual complaints, headache, numbness, weakness or ataxia or problems with walking or coordination,  change in mood or  memory.        No outpatient medications have been marked as taking for the 03/24/20 encounter (Appointment) with Tanda Rockers, MD.                 Past Medical History:  Diagnosis Date  . Breast cancer ([place]) 2007   Stage I (T1b N0 M0), grade 1 well-differentiated carcinoma of the left breast status, post lumpectomy followed by radiation therapy. Her estrogen receptor receptors were 93%, progesterone receptors 67%. HER-2/neu was negative. No lymphovascular space invasion was seen. All margins were clear. Ki-67 marker was low at 1% with surgery on 11/15/2004. Treated then with post-lumpectomy radiation, finish  . Breast cancer, left breast ([place]) 2007  . COPD (chronic obstructive pulmonary disease) ([place])   . Diabetes mellitus without complication ([place])   . Diverticula of colon   . Hx of rickettsial disease   . Hyperlipidemia   . Hypertension   . Kidney stones   . Nicotine dependence   . Osteoarthritis        Objective:      03/24/2020          ***  12/29/19 179 lb (81.2 kg)  12/04/19 177 lb (80.3 kg)  11/19/19 177 lb 3.2 oz (80.4 kg)     Vital signs reviewed  03/24/2020  - Note at rest 02 sats  ***% on ***   General appearance:    ***        Min bar***         Assessment

## 2020-03-24 NOTE — Telephone Encounter (Signed)
Call made to patient, confirmed DOB. Made aware of MW recommendations. Voiced understanding. Confirmed pharmacy. Medication sent. Reminder given for appt on 3/21.   Nothing further needed at this time.

## 2020-03-29 ENCOUNTER — Encounter: Payer: Self-pay | Admitting: Internal Medicine

## 2020-03-29 ENCOUNTER — Ambulatory Visit (INDEPENDENT_AMBULATORY_CARE_PROVIDER_SITE_OTHER): Payer: 59 | Admitting: Internal Medicine

## 2020-03-29 ENCOUNTER — Ambulatory Visit: Payer: 59 | Admitting: Family Medicine

## 2020-03-29 ENCOUNTER — Other Ambulatory Visit: Payer: Self-pay

## 2020-03-29 VITALS — BP 140/80 | HR 90 | Resp 18 | Ht 65.0 in | Wt 176.0 lb

## 2020-03-29 DIAGNOSIS — R7303 Prediabetes: Secondary | ICD-10-CM

## 2020-03-29 DIAGNOSIS — J449 Chronic obstructive pulmonary disease, unspecified: Secondary | ICD-10-CM | POA: Diagnosis not present

## 2020-03-29 DIAGNOSIS — I10 Essential (primary) hypertension: Secondary | ICD-10-CM

## 2020-03-29 DIAGNOSIS — L0390 Cellulitis, unspecified: Secondary | ICD-10-CM | POA: Diagnosis not present

## 2020-03-29 NOTE — Assessment & Plan Note (Signed)
BP Readings from Last 1 Encounters:  03/29/20 140/80   Well-controlled with Irbesartan and HCTZ Counseled for compliance with the medications Advised DASH diet and moderate exercise/walking as tolerated

## 2020-03-29 NOTE — Assessment & Plan Note (Signed)
Well-controlled with Symbicort and PRN Albuterol F/u with Pulmonology

## 2020-03-29 NOTE — Assessment & Plan Note (Signed)
Lab Results  Component Value Date   HGBA1C 6.2 (H) 02/16/2020   Continue to follow low carbohydrate diet

## 2020-03-29 NOTE — Progress Notes (Signed)
Established Patient Office Visit  Subjective:  Patient ID: [name], female    DOB: 02/14/1945  Age: 75 y.o. MRN: [id number]  CC:  Chief Complaint  Patient presents with  . Follow-up    Follow up bp check     HPI [name] presents for follow up of her chronic medical conditions.  HTN: Her BP is on higher normal side. She is on Irbesartan and HCTZ. Denies any headache, dizziness, chest pain, dyspnea or palpitations.  Prediabetes: Continue to follow low carbohydrate diet. Denies any polyuria or polydipsia.  Hand cellulitis: She has an area of redness and swelling on the left hand, 5th digit. She reports that she had an unknown insect bite at home while she was sitting outdoors. Denies any discharge from the site. Has been taking Doxycycline, originally prescribed for sinusitis by her Pulmonologist.  COPD: Well-controlled with Symbicort and PRN Albuterol.  Past Medical History:  Diagnosis Date  . Breast cancer ([place]) 2007   Stage I (T1b N0 M0), grade 1 well-differentiated carcinoma of the left breast status, post lumpectomy followed by radiation therapy. Her estrogen receptor receptors were 93%, progesterone receptors 67%. HER-2/neu was negative. No lymphovascular space invasion was seen. All margins were clear. Ki-67 marker was low at 1% with surgery on 11/15/2004. Treated then with post-lumpectomy radiation, finish  . Breast cancer, left breast ([place]) 2007  . COPD (chronic obstructive pulmonary disease) ([place])   . Diabetes mellitus without complication ([place])   . Diverticula of colon   . Hx of rickettsial disease   . Hyperlipidemia   . Hypertension   . Kidney stones   . Nicotine dependence   . Osteoarthritis     Past Surgical History:  Procedure Laterality Date  . ABDOMINAL HYSTERECTOMY    . BREAST SURGERY  2005 approx   left lumpectomy   . CATARACT EXTRACTION W/PHACO Left 05/01/2019   Procedure: CATARACT EXTRACTION PHACO AND INTRAOCULAR LENS PLACEMENT LEFT EYE  CDE=11.73;  Surgeon: Baruch Goldmann, MD;  Location: [facility];  Service: Ophthalmology;  Laterality: Left;  left  . CATARACT EXTRACTION W/PHACO Right 05/15/2019   Procedure: CATARACT EXTRACTION PHACO AND INTRAOCULAR LENS PLACEMENT RIGHT EYE;  Surgeon: Baruch Goldmann, MD;  Location: [facility];  Service: Ophthalmology;  Laterality: Right;  CDE: 16.07  . CHOLECYSTECTOMY N/A 05/26/2014   Procedure: LAPAROSCOPIC CHOLECYSTECTOMY;  Surgeon: Aviva Signs Md, MD;  Location: [facility];  Service: General;  Laterality: N/A;  . COLECTOMY     2005, diverticulitis  . COLONOSCOPY N/A 05/20/2017   Procedure: COLONOSCOPY;  Surgeon: Danie Binder, MD;  Location: [facility];  Service: Endoscopy;  Laterality: N/A;  1:45pm  . DILATION AND CURETTAGE OF UTERUS    . ESOPHAGOGASTRODUODENOSCOPY N/A 05/20/2017   Procedure: ESOPHAGOGASTRODUODENOSCOPY (EGD);  Surgeon: Danie Binder, MD;  Location: [facility];  Service: Endoscopy;  Laterality: N/A;  . left breast      cancer, in 2006  . TUBAL LIGATION      Family History  Problem Relation Age of Onset  . Breast cancer Mother   . COPD Father   . Emphysema Father   . Throat cancer Sister   . Glaucoma Brother   . Schizophrenia Brother   . Heart attack Brother   . Lung cancer Sister        former smoker  . Hepatitis C Daughter   . Seizures Daughter   . SIDS Son   . Colon cancer Neg Hx   . Colon polyps Neg  Hx     Social History   Socioeconomic History  . Marital status: Married    Spouse name: Not on file  . Number of children: 5  . Years of education: 9th grade   . Highest education level: 9th grade  Occupational History  . Occupation: retired    Fish farm manager: RETIRED  Tobacco Use  . Smoking status: Current Every Day Smoker    Packs/day: 1.00    Years: 50.00    Pack years: 50.00    Types: Cigarettes  . Smokeless tobacco: Never Used  . Tobacco comment: smokes 1/2 pack per day 11/19/2019  Vaping Use  . Vaping Use: Never used  Substance and Sexual Activity   . Alcohol use: No    Alcohol/week: 0.0 standard drinks  . Drug use: No  . Sexual activity: Not Currently  Other Topics Concern  . 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 QUIT WORKING 2003 AFTER CANCER DIAGNOSIS.  MARRIED WITH A RUN AWAY HUSBAND(FUGITIVE 10 YRS). 3 MISCARRIAGES, 5 CHILDREN   Social Determinants of Health   Financial Resource Strain: Low Risk   . Difficulty of Paying Living Expenses: Not hard at all  Food Insecurity: No Food Insecurity  . Worried About Charity fundraiser in the Last Year: Never true  . Ran Out of Food in the Last Year: Never true  Transportation Needs: No Transportation Needs  . Lack of Transportation (Medical): No  . Lack of Transportation (Non-Medical): No  Physical Activity: Sufficiently Active  . Days of Exercise per Week: 7 days  . Minutes of Exercise per Session: 60 min  Stress: No Stress Concern Present  . Feeling of Stress : Not at all  Social Connections: Moderately Isolated  . Frequency of Communication with Friends and Family: More than three times a week  . Frequency of Social Gatherings with Friends and Family: Once a week  . Attends Religious Services: Never  . Active Member of Clubs or Organizations: No  . Attends Archivist Meetings: Never  . Marital Status: Married  Human resources officer Violence: Not At Risk  . Fear of Current or Ex-Partner: No  . Emotionally Abused: No  . Physically Abused: No  . Sexually Abused: No    Outpatient Medications Prior to Visit  Medication Sig Dispense Refill  . acetaminophen (TYLENOL) 325 MG tablet Take 650 mg by mouth every 6 (six) hours as needed for moderate pain.     Marland Kitchen albuterol (PROAIR HFA) 108 (90 Base) MCG/ACT inhaler 2 puffs every 4 hours as needed only  if your can't catch your breath 18 g 1  . albuterol (PROVENTIL) (2.5 MG/3ML) 0.083% nebulizer solution INHALE ONE VIAL VIA  NEBULIZER THREE TIMES DAILY. (Patient taking differently: Take 2.5 mg by nebulization 3 (three) times daily as needed for wheezing or shortness of breath.) 300 mL 0  . aspirin EC 81 MG tablet Take 81 mg by mouth daily.    . Calcium Carbonate-Vitamin D (CALCIUM 600 + D PO) Take 1 tablet by mouth 2 (two) times daily. 600/400    . cyclobenzaprine (FLEXERIL) 5 MG tablet TAKE ONE TABLET BY MOUTH AT BEDTIME AS NEEDED. 30 tablet 0  . doxycycline (VIBRA-TABS) 100 MG tablet Take 1 tablet (100 mg total) by mouth 2 (two) times daily for 10 days. 20 tablet 0  . famotidine (PEPCID) 20 MG tablet Take 20 mg by  mouth at bedtime.     . fluticasone (FLONASE) 50 MCG/ACT nasal spray Place 2 sprays into both nostrils daily. 16 g 5  . hydrochlorothiazide (HYDRODIURIL) 25 MG tablet TAKE 1 TABLET BY MOUTH ONCE A DAY. 30 tablet 0  . ipratropium (ATROVENT HFA) 17 MCG/ACT inhaler INHALE 2 PUFFS BY MOUTH FOUR TIMES A DAY. 12.9 g 0  . irbesartan (AVAPRO) 300 MG tablet Take 1 tablet (300 mg total) by mouth daily. 30 tablet 11  . loratadine (CLARITIN) 10 MG tablet TAKE 1 TABLET BY MOUTH ONCE DAILY FOR ALLERGIES. 30 tablet 0  . montelukast (SINGULAIR) 10 MG tablet TAKE 1 TABLET BY MOUTH AT BEDTIME. 90 tablet 0  . polyethylene glycol powder (GLYCOLAX/MIRALAX) powder MIX 1 CAPFUL IN 8 OUNCES OF JUICE OR WATER AND DRINK ONCE DAILY. (Patient taking differently: Take 17 g by mouth daily.) 527 g 5  . potassium chloride SA (KLOR-CON) 20 MEQ tablet TAKE 1 TABLET BY MOUTH DAILY. 90 tablet 0  . simvastatin (ZOCOR) 40 MG tablet TAKE (1) TABLET BY MOUTH AT BEDTIME. 90 tablet 1  . SYMBICORT 160-4.5 MCG/ACT inhaler INHALE 2 PUFFS INTO THE LUNGS TWICE DAILY. 10.2 g 0  . traMADol (ULTRAM) 50 MG tablet One tablet every six hours as needed for pain. 60 tablet 2  . azithromycin (ZITHROMAX) 250 MG tablet Take 1 tablet (250 mg total) by mouth as directed. 6 tablet 0   No facility-administered medications prior to visit.    Allergies  Allergen  Reactions  . Penicillins Hives, Shortness Of Breath and Swelling    Has patient had a PCN reaction causing immediate rash, facial/tongue/throat swelling, SOB or lightheadedness with hypotension: yes Has patient had a PCN reaction causing severe rash involving mucus membranes or skin necrosis:no Has patient had a PCN reaction that required hospitalization: yes Has patient had a PCN reaction occurring within the last 10 years: no If all of the above answers are "NO", then may proceed with Cephalosporin use.  . Sulfonamide Derivatives Other (See Comments)    Shaking all over, seizure like symptoms. Hospitalization resulted     ROS Review of Systems  Constitutional: Negative for chills and fever.  HENT: Negative for congestion, ear discharge, rhinorrhea, sinus pressure, sinus pain and sore throat.   Eyes: Negative for pain and discharge.  Respiratory: Positive for cough (chronic). Negative for shortness of breath.   Cardiovascular: Negative for chest pain and palpitations.  Gastrointestinal: Negative for abdominal pain, constipation, diarrhea, nausea and vomiting.  Endocrine: Negative for polydipsia and polyuria.  Genitourinary: Negative for dysuria and hematuria.  Musculoskeletal: Negative for neck pain and neck stiffness.  Skin: Positive for color change (5th digit, right hand).  Neurological: Negative for dizziness and weakness.  Psychiatric/Behavioral: Negative for agitation and behavioral problems.      Objective:    Physical Exam Vitals reviewed.  Constitutional:      General: She is not in acute distress.    Appearance: She is not diaphoretic.  HENT:     Head: Normocephalic and atraumatic.     Nose: Nose normal. No congestion.     Mouth/Throat:     Mouth: Mucous membranes are moist.     Pharynx: No posterior oropharyngeal erythema.  Eyes:     General: No scleral icterus.    Extraocular Movements: Extraocular movements intact.     Pupils: Pupils are equal, round, and  reactive to light.  Cardiovascular:     Rate and Rhythm: Normal rate and regular rhythm.  Pulses: Normal pulses.     Heart sounds: Normal heart sounds. No murmur heard.   Pulmonary:     Breath sounds: Normal breath sounds. No wheezing or rales.  Musculoskeletal:     Cervical back: Neck supple. No rigidity or tenderness.     Right lower leg: No edema.     Left lower leg: No edema.  Skin:    General: Skin is warm.     Findings: Erythema (5th digit, right hand - dorsal aspect with swelling) present.  Neurological:     General: No focal deficit present.     Mental Status: She is alert and oriented to person, place, and time.     Sensory: No sensory deficit.     Motor: No weakness.  Psychiatric:        Mood and Affect: Mood normal.        Behavior: Behavior normal.     BP 140/80 (BP Location: Right Arm, Patient Position: Sitting, Cuff Size: Normal)   Pulse 90   Resp 18   Ht '5\' 5"'  (1.651 m)   Wt 176 lb (79.8 kg)   SpO2 93%   BMI 29.29 kg/m  Wt Readings from Last 3 Encounters:  03/29/20 176 lb (79.8 kg)  02/16/20 179 lb (81.2 kg)  12/29/19 179 lb (81.2 kg)     Health Maintenance Due  Topic Date Due  . OPHTHALMOLOGY EXAM  09/07/2015  . COVID-19 Vaccine (3 - Pfizer risk 4-dose series) 05/20/2019    There are no preventive care reminders to display for this patient.  Lab Results  Component Value Date   TSH 1.830 02/16/2020   Lab Results  Component Value Date   WBC 12.3 (H) 02/16/2020   HGB 14.8 02/16/2020   HCT 45.0 02/16/2020   MCV 87 02/16/2020   PLT 408 02/16/2020   Lab Results  Component Value Date   NA 143 02/16/2020   K 4.5 02/16/2020   CO2 25 02/16/2020   GLUCOSE 118 (H) 02/16/2020   BUN 19 02/16/2020   CREATININE 0.98 02/16/2020   BILITOT 0.2 02/16/2020   ALKPHOS 84 02/16/2020   AST 18 02/16/2020   ALT 15 02/16/2020   PROT 6.7 02/16/2020   ALBUMIN 4.0 02/16/2020   CALCIUM 9.6 02/16/2020   ANIONGAP 10 10/13/2019   Lab Results  Component  Value Date   CHOL 180 02/16/2020   Lab Results  Component Value Date   HDL 64 02/16/2020   Lab Results  Component Value Date   LDLCALC 82 02/16/2020   Lab Results  Component Value Date   TRIG 208 (H) 02/16/2020   Lab Results  Component Value Date   CHOLHDL 2.8 02/16/2020   Lab Results  Component Value Date   HGBA1C 6.2 (H) 02/16/2020      Assessment & Plan:   Problem List Items Addressed This Visit      Cardiovascular and Mediastinum   HTN (hypertension) - Primary    BP Readings from Last 1 Encounters:  03/29/20 140/80   Well-controlled with Irbesartan and HCTZ Counseled for compliance with the medications Advised DASH diet and moderate exercise/walking as tolerated      Relevant Orders   CBC   HgB A1c   Lipid panel   BASIC METABOLIC PANEL WITH GFR     Respiratory   COPD (chronic obstructive pulmonary disease) with chronic bronchitis (HCC)    Well-controlled with Symbicort and PRN Albuterol F/u with Pulmonology        Other  Prediabetes    Lab Results  Component Value Date   HGBA1C 6.2 (H) 02/16/2020   Continue to follow low carbohydrate diet      Relevant Orders   HgB A1c   Lipid panel   BASIC METABOLIC PANEL WITH GFR    Other Visit Diagnoses    Cellulitis, unspecified cellulitis site  Continue Doxycycline Keep area clean and dry         No orders of the defined types were placed in this encounter.   Follow-up: Return in about 4 months (around 07/29/2020) for HTN and COPD.    Lindell Spar, MD

## 2020-03-29 NOTE — Patient Instructions (Signed)
Continue taking your medications regularly.  Apply lotion to help with dry skin.  Please let us know if you notice any discharge from the finger.  Please continue your efforts to cut down smoking.

## 2020-04-04 ENCOUNTER — Ambulatory Visit (INDEPENDENT_AMBULATORY_CARE_PROVIDER_SITE_OTHER): Payer: 59 | Admitting: Internal Medicine

## 2020-04-04 ENCOUNTER — Encounter: Payer: Self-pay | Admitting: Internal Medicine

## 2020-04-04 ENCOUNTER — Other Ambulatory Visit: Payer: Self-pay

## 2020-04-04 DIAGNOSIS — J449 Chronic obstructive pulmonary disease, unspecified: Secondary | ICD-10-CM | POA: Diagnosis not present

## 2020-04-04 NOTE — Patient Instructions (Addendum)
The key is to stop smoking completely before smoking completely stops you!  Only use your albuterol as a rescue medication to be used if you can't catch your breath by resting or doing a relaxed purse lip breathing pattern.  - The less you use it, the better it will work when you need it. - Ok to use up to 2 puffs  every 4 hours if you must but call for immediate appointment if use goes up over your usual need - Don't leave home without it !!  (think of it like the spare tire for your car)   Work on inhaler technique:  relax and gently blow all the way out then take a nice smooth deep breath back in, triggering the inhaler at same time you start breathing in.  Hold for up to 5 seconds if you can. Blow out thru nose. Rinse and gargle with water when done      Bring the empty symbicort container with you - remember the golfer and the practice swings    Please schedule a follow up visit in 6  months but call sooner if needed

## 2020-04-04 NOTE — Progress Notes (Addendum)
 [name], female    DOB: 03/25/1945    MRN: [id number]   Brief patient profile:  74 yowf active smoker with new onset doe around 2014 dx as copd (though PFTs 03/03/12 did not confirm) and referred to pulmonary clinic in [place]  11/19/2019 by Dr    Simpson for refractory cough and sob  History of Present Illness  11/19/2019  Pulmonary/ 1st office eval/  / [place] [facility] maint on symbicort 160/atrovent Chief Complaint  Patient presents with  . Consult    sinus congestion for about a 1-2 weeks  Dyspnea:  Goal mb and back 100 ft /  Variably sob shopping / not using HC parking  Cough: sporadic  Sleep: fine / some worse cough in am SABA use: not clear  rec Benazapril 40 mg  > stop this and replace with ibesartan 300 mg one daily in its place  Plan A = Automatic = Always=    symbicort 160 Take 2 puffs first thing in am and then another 2 puffs about 12 hours later.  Work on inhaler technique:   Plan B = Backup (to supplement plan A, not to replace it) Only use your albuterol inhaler as a rescue medication  Plan C = Crisis (instead of Plan B but only if Plan B stops working) - only use your albuterol nebulizer if you first try Plan B and it fails to help  The key is to stop smoking completely before smoking completely stops you! Please schedule a follow up office visit in 6 weeks, call sooner if needed with all medications     12/29/2019  f/u ov/ office/ re: doe/ brown mucus x one week already took medrol dose pack p televisit with pcp  Chief Complaint  Patient presents with  . Follow-up    Still bothered by sinus congestion. She is c/o cough with brown sputum over the past wk. She is using the albuterol inhaler about 3 x per wk.   Asthmatic bronchitis, chronic (HCC) Active smoker  - PFT's  03/03/2012  FEV1 1.69 (69 % ) ratio 0.72  p 5 % improvement from saba p ? prior to study with DLCO  17.42 (68%) corrects to 4.53 (92%)  for alv volume and FV curve min  curvature/ ERV 37%   - try off acei 11/19/2019 and return for medication reconciliation in 6 weeks  Hypertension goal BP (blood pressure) < 130/80  Dyspnea:  Better though very inactive Cough:  X one week worse as above   SABA use: very poor insight into ABC plan, could not articulate any part of it even with prompting  02: none  rec Continue Irbesartan 300 mg one daily plus add  hydrodiuril (hydrochlorothiazide) 25 mg one daily  zpak should changes mucus brown to clear  The key is to stop smoking completely before smoking completely stops you! For cough > mucinex 1200 mg every 12 hours as needed (over the counter) Plan A = Automatic = Always=    symbicort 160 Take 2 puffs first thing in am and then another 2 puffs about 12 hours later atrovent is 2 puffs four times daily  Work on inhaler technique:  Plan B = Backup (to supplement plan A, not to replace it) Only use your albuterol inhaler as a rescue medication to be used if you can't catch your breath  Plan C = Crisis (instead of Plan B but only if Plan B stops working) - only use your albuterol nebulizer if you first   try Plan B and it fails to help > ok to use the nebulizer up to every 4 hours but if start needing it regularly call for immediate appointment Please schedule a follow up office visit in 6 weeks with pfts on return      04/04/2020   f/u ov/[place] office/[name] re:  Breathing is doing some better since the last visit. She is coughing less but still has prod cough with yellow to clear sputum.  Dyspnea:  mb and back better Cough: some am cough  Sleeping: bed is flat right down one pillow like a log  SABA use: 2 bid  02: none  Covid status: vax x 3      No obvious day to day or daytime variability or assoc excess/ purulent sputum or mucus plugs or hemoptysis or cp or chest tightness, subjective wheeze or overt sinus or hb symptoms.   Sleeping as above without nocturnal  or early am exacerbation  of respiratory  c/o's or  need for noct saba. Also denies any obvious fluctuation of symptoms with weather or environmental changes or other aggravating or alleviating factors except as outlined above   No unusual exposure hx or h/o childhood pna/ asthma or knowledge of premature birth.  Current Allergies, Complete Past Medical History, Past Surgical History, Family History, and Social History were reviewed in [employer] record.  ROS  The following are not active complaints unless bolded Hoarseness, sore throat, dysphagia, dental problems, itching, sneezing,  nasal congestion or discharge of excess mucus or purulent secretions, ear ache,   fever, chills, sweats, unintended wt loss or wt gain, classically pleuritic or exertional cp,  orthopnea pnd or arm/hand swelling  or leg swelling, presyncope, palpitations, abdominal pain, anorexia, nausea, vomiting, diarrhea  or change in bowel habits or change in bladder habits, change in stools or change in urine, dysuria, hematuria,  rash, arthralgias, visual complaints, headache, numbness, weakness or ataxia or problems with walking or coordination,  change in mood or  memory.              Past Medical History:  Diagnosis Date  . Breast cancer ([place]) 2007   Stage I (T1b N0 M0), grade 1 well-differentiated carcinoma of the left breast status, post lumpectomy followed by radiation therapy. Her estrogen receptor receptors were 93%, progesterone receptors 67%. HER-2/neu was negative. No lymphovascular space invasion was seen. All margins were clear. Ki-67 marker was low at 1% with surgery on 11/15/2004. Treated then with post-lumpectomy radiation, finish  . Breast cancer, left breast ([place]) 2007  . COPD (chronic obstructive pulmonary disease) ([place])   . Diabetes mellitus without complication ([place])   . Diverticula of colon   . Hx of rickettsial disease   . Hyperlipidemia   . Hypertension   . Kidney stones   . Nicotine dependence   . Osteoarthritis         Objective:         04/04/2020        174  12/29/19 179 lb (81.2 kg)  12/04/19 177 lb (80.3 kg)  11/19/19 177 lb 3.2 oz (80.4 kg)     Vital signs reviewed  03/24/2020  - Note at rest 02 sats   100% on RA   General appearance:    amb wf nad     HEENT : pt wearing mask not removed for exam due to covid - 19 concerns.   NECK :  without JVD/Nodes/TM/ nl carotid upstrokes bilaterally   LUNGS: no  acc muscle use,  Min barrel  contour chest wall with bilateral  slightly decreased bs s audible wheeze and  without cough on insp or exp maneuvers and min  Hyperresonant  to  percussion bilaterally     CV:  RRR  no s3 or murmur or increase in P2, and no edema   ABD:  soft and nontender with pos end  insp Hoover's  in the supine position. No bruits or organomegaly appreciated, bowel sounds nl  MS:   Nl gait/  ext warm without deformities, calf tenderness, cyanosis or clubbing No obvious joint restrictions   SKIN: warm and dry without lesions    NEURO:  alert, approp, nl sensorium with  no motor or cerebellar deficits apparent.              Assessment

## 2020-04-05 ENCOUNTER — Encounter: Payer: Self-pay | Admitting: Internal Medicine

## 2020-04-05 NOTE — Assessment & Plan Note (Signed)
Active smoker  - PFT's  03/03/2012  FEV1 1.69 (69 % ) ratio 0.72  p 5 % improvement from saba p ? prior to study with DLCO  17.42 (68%) corrects to 4.53 (92%)  for alv volume and FV curve min curvature/ ERV 37%   - try off acei 11/19/2019 and return for medication reconciliation in 6 weeks - 04/04/2020  After extensive coaching inhaler device,  effectiveness =    25%   Doing relatively well considering still smoking with suboptimal hfa  Suggested she use empty symbicort to self train on approp technique (the golfer swing analogy) and f/u in 6 m, sooner prn.          Each maintenance medication was reviewed in detail including emphasizing most importantly the difference between maintenance and prns and under what circumstances the prns are to be triggered using an action plan format where appropriate.  Total time for H and P, chart review, counseling, reviewing hfa device(s) and generating customized AVS unique to this office visit / same day charting = 22 min

## 2020-04-18 ENCOUNTER — Other Ambulatory Visit: Payer: Self-pay

## 2020-04-18 ENCOUNTER — Ambulatory Visit (HOSPITAL_COMMUNITY)
Admission: RE | Admit: 2020-04-18 | Discharge: 2020-04-18 | Disposition: A | Discharge status: Home / Self Care | Payer: 59 | Source: Ambulatory Visit | Attending: Internal Medicine | Admitting: Internal Medicine

## 2020-04-18 DIAGNOSIS — Z780 Asymptomatic menopausal state: Secondary | ICD-10-CM | POA: Insufficient documentation

## 2020-04-18 DIAGNOSIS — Z13820 Encounter for screening for osteoporosis: Secondary | ICD-10-CM | POA: Diagnosis not present

## 2020-04-18 DIAGNOSIS — M8580 Other specified disorders of bone density and structure, unspecified site: Secondary | ICD-10-CM | POA: Diagnosis present

## 2020-04-18 DIAGNOSIS — Z853 Personal history of malignant neoplasm of breast: Secondary | ICD-10-CM | POA: Diagnosis not present

## 2020-04-18 DIAGNOSIS — M8589 Other specified disorders of bone density and structure, multiple sites: Secondary | ICD-10-CM | POA: Diagnosis not present

## 2020-04-19 ENCOUNTER — Other Ambulatory Visit: Payer: Self-pay | Admitting: Internal Medicine

## 2020-04-19 DIAGNOSIS — M8580 Other specified disorders of bone density and structure, unspecified site: Secondary | ICD-10-CM

## 2020-04-19 DIAGNOSIS — R918 Other nonspecific abnormal finding of lung field: Secondary | ICD-10-CM

## 2020-04-19 DIAGNOSIS — J449 Chronic obstructive pulmonary disease, unspecified: Secondary | ICD-10-CM

## 2020-04-19 MED ORDER — ALENDRONATE SODIUM 70 MG PO TABS: 70.0000 mg | ORAL_TABLET | ORAL | 11 refills | Status: DC

## 2020-04-19 NOTE — Progress Notes (Signed)
Started Alendronate due to osteopenia with FRAX more than 20%.

## 2020-05-05 ENCOUNTER — Encounter: Payer: Self-pay | Admitting: Family Medicine

## 2020-05-05 ENCOUNTER — Ambulatory Visit (INDEPENDENT_AMBULATORY_CARE_PROVIDER_SITE_OTHER): Payer: 59 | Admitting: Family Medicine

## 2020-05-05 ENCOUNTER — Other Ambulatory Visit: Payer: Self-pay

## 2020-05-05 VITALS — BP 130/75 | HR 94 | Resp 16 | Ht 65.0 in | Wt 177.4 lb

## 2020-05-05 DIAGNOSIS — M1990 Unspecified osteoarthritis, unspecified site: Secondary | ICD-10-CM

## 2020-05-05 DIAGNOSIS — I10 Essential (primary) hypertension: Secondary | ICD-10-CM

## 2020-05-05 DIAGNOSIS — J449 Chronic obstructive pulmonary disease, unspecified: Secondary | ICD-10-CM

## 2020-05-05 DIAGNOSIS — F17210 Nicotine dependence, cigarettes, uncomplicated: Secondary | ICD-10-CM | POA: Diagnosis not present

## 2020-05-05 NOTE — Progress Notes (Signed)
   KASSONDRA GEIL     MRN: [id number]      DOB: 06/22/1945   HPI Ms. Boyum is  Here requesting PCS  services which she has benefited from before  She does have severe COPD and chronic bronchitis, as well as general;ized arthritis, and has severe difficulty with self care as well as with house chores as a result of  both  And needs and will benefit fro in home aide assistance   ROS Denies recent fever or chills. Denies sinus pressure, nasal congestion, ear pain or sore throat. Chronic smoker's cough and wheeze Denies chest pains, palpitations and leg swelling Denies abdominal pain, nausea, vomiting,diarrhea or constipation.   Denies dysuria, frequency, hesitancy or incontinence. Chronic  joint pain,  and limitation in mobility. Denies headaches, seizures, c/o lower extremity  numbness, and tingling. Denies depression, anxiety or insomnia. Denies skin break down or rash.   PE  BP 130/75   Pulse 94   Resp 16   Ht 5\' 5"  (1.651 m)   Wt 177 lb 6.4 oz (80.5 kg)   SpO2 94%   BMI 29.52 kg/m   Patient alert and oriented and in no cardiopulmonary distress.  HEENT: No facial asymmetry, EOMI,     Neck supple .  Chest: decreased air entry , scattered wheezes  CVS: S1, S2 no murmurs, no S3.Regular rate.  ABD: Soft non tender.   Ext: No edema  MS: decreased  ROM spine, shoulders, hips and knees.  Skin: Intact, no ulcerations or rash noted.  Psych: Good eye contact, normal affect. Memory intact not anxious or depressed appearing.  CNS: CN 2-12 intact, power,  normal throughout.no focal deficits noted.   Assessment & Plan  Cigarette smoker Asked:confirms currently smokes cigarettes 1 PPD Assess: Unwilling to set a quit date, Advise: needs to QUIT to reduce risk of cancer, cardio and cerebrovascular disease Assist: counseled for 5 minutes and literature provided Arrange: follow up in 2 to 4 months   COPD (chronic obstructive pulmonary disease) with chronic bronchitis  ([place]) Dyspneic with limited activity, needs in home assistance for ADL's and PCS services  HTN (hypertension) Controlled, no change in medication DASH diet and commitment to daily physical activity for a minimum of 30 minutes discussed and encouraged, as a part of hypertension management. The importance of attaining a healthy weight is also discussed.  BP/Weight 05/05/2020 04/04/2020 03/29/2020 02/16/2020 12/29/2019 12/04/2019 00/09/3816  Systolic BP 299 371 696 789 381 017 510  Diastolic BP 75 80 80 90 258 92 92  Wt. (Lbs) 177.4 174 176 179 179 177 177.2  BMI 29.52 28.96 29.29 30.73 29.79 29.45 29.49       Asthmatic bronchitis , chronic (HCC) Actively smoking with no quit date planned Continue maintainace therapy Debilitating and limits function, needs in home help  GENERALIZED OSTEOARTHROSIS UNSPECIFIED SITE Chronic back pain and generalized joint pains and styiffness, needs in home help

## 2020-05-05 NOTE — Patient Instructions (Signed)
F/U in early Sept, flu vaccine at visit, call if you need me sooner [facility] at visit  You do qualify for and need services, and form is completed  Need to quit smoking , please try cutting back gradually  Careful not to fall  Thanks for choosing [facility], we consider it a privelige to serve you.

## 2020-05-07 ENCOUNTER — Encounter: Payer: Self-pay | Admitting: Family Medicine

## 2020-05-07 NOTE — Assessment & Plan Note (Signed)
Controlled, no change in medication DASH diet and commitment to daily physical activity for a minimum of 30 minutes discussed and encouraged, as a part of hypertension management. The importance of attaining a healthy weight is also discussed.  BP/Weight 05/05/2020 04/04/2020 03/29/2020 02/16/2020 12/29/2019 12/04/2019 76/01/9507  Systolic BP 326 712 458 099 833 825 053  Diastolic BP 75 80 80 90 976 92 92  Wt. (Lbs) 177.4 174 176 179 179 177 177.2  BMI 29.52 28.96 29.29 30.73 29.79 29.45 29.49

## 2020-05-07 NOTE — Assessment & Plan Note (Signed)
Dyspneic with limited activity, needs in home assistance for ADL's and PCS services

## 2020-05-07 NOTE — Assessment & Plan Note (Signed)
Actively smoking with no quit date planned Continue maintainace therapy Debilitating and limits function, needs in home help

## 2020-05-07 NOTE — Assessment & Plan Note (Signed)
Asked:confirms currently smokes cigarettes 1 PPD Assess: Unwilling to set a quit date,  Advise: needs to QUIT to reduce risk of cancer, cardio and cerebrovascular disease Assist: counseled for 5 minutes and literature provided Arrange: follow up in 2 to 4 months  

## 2020-05-07 NOTE — Assessment & Plan Note (Signed)
Chronic back pain and generalized joint pains and styiffness, needs in home help

## 2020-05-12 ENCOUNTER — Other Ambulatory Visit: Payer: Self-pay | Admitting: Family Medicine

## 2020-05-18 ENCOUNTER — Telehealth: Payer: Self-pay

## 2020-05-18 NOTE — Telephone Encounter (Signed)
CAP PROGRAM  COPIED NOTED SLEEVED

## 2020-05-21 ENCOUNTER — Encounter: Payer: Self-pay | Admitting: Gastroenterology

## 2020-05-21 NOTE — Progress Notes (Signed)
Referring Provider: [name], MD Primary Care Physician:  [name], MD Primary GI Physician: Dr. Abbey Chatters  Chief Complaint  Patient presents with  . Consult    Schedule TCS. Reports last done 2019    HPI:   [name] is a 75 y.o. female with a GI history of constipation presenting today to schedule surveillance colonoscopy.  Last colonoscopy in May 2019 with five 3-6 mm polyps (4 tubular adenomas, 1 hyperplastic polyp), tortuous left colon, internal hemorrhoids.  Recommended repeat colonoscopy in 3 years.  EGD also performed at the same time with normal esophagus and mild gastritis due to aspirin.  Biopsies negative for H. pylori.  Today: No lower GI concerns.  Bowels move well with MiraLAX daily.  No BRBPR or melena.  She does report intermittent pain in her right upper quadrant/underneath her right rib cage.  Symptoms occur daily, come and go.  Started 2 to 3 weeks ago.  No identified trigger.  States she can be sitting when she feels it.  No association with certain movements or meals.  Has not fallen or pick up anything heavy.  History of cholecystectomy.  Denies associated nausea or vomiting.  Rare GERD symptoms.  Takes Prilosec once or twice a month as needed and Pepcid nightly.  Denies dysphagia.  No NSAIDs.   She does report increased cough.  States she has bronchitis and needs to make another appointment with her primary care provider.  Denies shortness of breath.    Past Medical History:  Diagnosis Date  . Breast cancer ([place]) 2007   Stage I (T1b N0 M0), grade 1 well-differentiated carcinoma of the left breast status, post lumpectomy followed by radiation therapy. Her estrogen receptor receptors were 93%, progesterone receptors 67%. HER-2/neu was negative. No lymphovascular space invasion was seen. All margins were clear. Ki-67 marker was low at 1% with surgery on 11/15/2004. Treated then with post-lumpectomy radiation, finish  . Breast cancer, left  breast ([place]) 2007  . COPD (chronic obstructive pulmonary disease) ([place])   . Diabetes mellitus without complication (HCC)    diet controlled  . Diverticula of colon   . Hx of rickettsial disease   . Hyperlipidemia   . Hypertension   . Kidney stones   . Nicotine dependence   . Osteoarthritis     Past Surgical History:  Procedure Laterality Date  . ABDOMINAL HYSTERECTOMY    . BREAST SURGERY  2005 approx   left lumpectomy   . CATARACT EXTRACTION W/PHACO Left 05/01/2019   Procedure: CATARACT EXTRACTION PHACO AND INTRAOCULAR LENS PLACEMENT LEFT EYE CDE=11.73;  Surgeon: Baruch Goldmann, MD;  Location: [facility];  Service: Ophthalmology;  Laterality: Left;  left  . CATARACT EXTRACTION W/PHACO Right 05/15/2019   Procedure: CATARACT EXTRACTION PHACO AND INTRAOCULAR LENS PLACEMENT RIGHT EYE;  Surgeon: Baruch Goldmann, MD;  Location: [facility];  Service: Ophthalmology;  Laterality: Right;  CDE: 16.07  . CHOLECYSTECTOMY N/A 05/26/2014   Procedure: LAPAROSCOPIC CHOLECYSTECTOMY;  Surgeon: Aviva Signs Md, MD;  Location: [facility];  Service: General;  Laterality: N/A;  . COLECTOMY     2005, diverticulitis  . COLONOSCOPY N/A 05/20/2017    Surgeon: Danie Binder, MD;  five 3-6 mm polyps (4 tubular adenomas, 1 hyperplastic polyp), tortuous left colon, internal hemorrhoids.  Recommended repeat colonoscopy in 3 years.  Marland Kitchen DILATION AND CURETTAGE OF UTERUS    . ESOPHAGOGASTRODUODENOSCOPY N/A 05/20/2017   Surgeon: Danie Binder, MD; normal esophagus and mild gastritis due to aspirin.  Biopsies negative for H. pylori.  . left breast      cancer, in 2006  . TUBAL LIGATION      Current Outpatient Medications  Medication Sig Dispense Refill  . acetaminophen (TYLENOL) 325 MG tablet Take 650 mg by mouth every 6 (six) hours as needed for moderate pain.     Marland Kitchen albuterol (PROAIR HFA) 108 (90 Base) MCG/ACT inhaler 2 puffs every 4 hours as needed only  if your can't catch your breath 18 g 1  . albuterol (PROVENTIL) (2.5  MG/3ML) 0.083% nebulizer solution INHALE ONE VIAL VIA NEBULIZER THREE TIMES DAILY. (Patient taking differently: Take 2.5 mg by nebulization 3 (three) times daily as needed for wheezing or shortness of breath.) 300 mL 0  . alendronate (FOSAMAX) 70 MG tablet Take 1 tablet (70 mg total) by mouth every 7 (seven) days. Take with a full glass of water on an empty stomach. 4 tablet 11  . aspirin EC 81 MG tablet Take 81 mg by mouth daily.    . ATROVENT HFA 17 MCG/ACT inhaler INHALE 2 PUFFS BY MOUTH FOUR TIMES A DAY. 12.9 g 0  . Calcium Carbonate-Vitamin D (CALCIUM 600 + D PO) Take 1 tablet by mouth 2 (two) times daily. 600/400    . cyclobenzaprine (FLEXERIL) 5 MG tablet TAKE ONE TABLET BY MOUTH AT BEDTIME AS NEEDED. 30 tablet 0  . famotidine (PEPCID) 20 MG tablet Take 20 mg by mouth at bedtime.     . fluticasone (FLONASE) 50 MCG/ACT nasal spray Place 2 sprays into both nostrils daily. 16 g 5  . hydrochlorothiazide (HYDRODIURIL) 25 MG tablet TAKE 1 TABLET BY MOUTH ONCE A DAY. 30 tablet 0  . irbesartan (AVAPRO) 300 MG tablet Take 1 tablet (300 mg total) by mouth daily. 30 tablet 11  . loratadine (CLARITIN) 10 MG tablet TAKE 1 TABLET BY MOUTH ONCE DAILY FOR ALLERGIES. 30 tablet 0  . montelukast (SINGULAIR) 10 MG tablet TAKE 1 TABLET BY MOUTH AT BEDTIME. 90 tablet 0  . omeprazole (PRILOSEC) 20 MG capsule Take 20 mg by mouth daily as needed.    . polyethylene glycol powder (GLYCOLAX/MIRALAX) powder MIX 1 CAPFUL IN 8 OUNCES OF JUICE OR WATER AND DRINK ONCE DAILY. (Patient taking differently: Take 17 g by mouth daily.) 527 g 5  . potassium chloride SA (KLOR-CON) 20 MEQ tablet TAKE 1 TABLET BY MOUTH DAILY. 90 tablet 0  . SYMBICORT 160-4.5 MCG/ACT inhaler INHALE 2 PUFFS INTO THE LUNGS TWICE DAILY. 10.2 g 0  . traMADol (ULTRAM) 50 MG tablet One tablet every six hours as needed for pain. 60 tablet 2   No current facility-administered medications for this visit.    Allergies as of 05/23/2020 - Review Complete  05/23/2020  Allergen Reaction Noted  . Penicillins Hives, Shortness Of Breath, and Swelling 09/29/2008  . Sulfonamide derivatives Other (See Comments) 09/29/2008    Family History  Problem Relation Age of Onset  . Breast cancer Mother   . COPD Father   . Emphysema Father   . Throat cancer Sister   . Glaucoma Brother   . Schizophrenia Brother   . Heart attack Brother   . Lung cancer Sister        former smoker  . Hepatitis C Daughter   . Seizures Daughter   . SIDS Son   . Colon cancer Neg Hx   . Colon polyps Neg Hx     Social History   Socioeconomic History  . Marital status: Married  Spouse name: Not on file  . Number of children: 5  . Years of education: 9th grade   . Highest education level: 9th grade  Occupational History  . Occupation: retired    Fish farm manager: RETIRED  Tobacco Use  . Smoking status: Current Every Day Smoker    Packs/day: 1.00    Years: 50.00    Pack years: 50.00    Types: Cigarettes  . Smokeless tobacco: Never Used  . Tobacco comment: smokes 1/2 pack per day 11/19/2019  Vaping Use  . Vaping Use: Never used  Substance and Sexual Activity  . Alcohol use: No    Alcohol/week: 0.0 standard drinks  . Drug use: No  . Sexual activity: Not Currently  Other Topics Concern  . 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 QUIT WORKING 2003 AFTER CANCER DIAGNOSIS.  MARRIED WITH A RUN AWAY HUSBAND(FUGITIVE 10 YRS). 3 MISCARRIAGES, 5 CHILDREN   Social Determinants of Health   Financial Resource Strain: Low Risk   . Difficulty of Paying Living Expenses: Not hard at all  Food Insecurity: No Food Insecurity  . Worried About Charity fundraiser in the Last Year: Never true  . Ran Out of Food in the Last Year: Never true  Transportation Needs: No Transportation Needs  . Lack of Transportation (Medical): No  . Lack of Transportation (Non-Medical): No   Physical Activity: Sufficiently Active  . Days of Exercise per Week: 7 days  . Minutes of Exercise per Session: 60 min  Stress: No Stress Concern Present  . Feeling of Stress : Not at all  Social Connections: Moderately Isolated  . Frequency of Communication with Friends and Family: More than three times a week  . Frequency of Social Gatherings with Friends and Family: Once a week  . Attends Religious Services: Never  . Active Member of Clubs or Organizations: No  . Attends Archivist Meetings: Never  . Marital Status: Married    Review of Systems: Gen: Denies fever, chills, cold or flulike symptoms, lightheadedness, dizziness, presyncope, syncope. CV: Denies chest pain or palpitations. Resp: See HPI GI: See HPI Heme: See HPI  Physical Exam: BP (!) 159/74   Pulse 94   Temp (!) 97.5 F (36.4 C)   Ht '5\' 5"'  (1.651 m)   Wt 178 lb 9.6 oz (81 kg)   BMI 29.72 kg/m  General:   Alert and oriented. No distress noted. Pleasant and cooperative.  Head:  Normocephalic and atraumatic. Eyes:  Conjuctiva clear without scleral icterus. Heart:  S1, S2 present without murmurs appreciated. Lungs:  Clear to auscultation bilaterally. No wheezes, rales, or rhonchi. No distress.  Abdomen:  +BS, soft, and non-distended. Mild TTP in RUQ at costal margin. Also with slight tenderness along right lower ribs. No rebound or guarding. No HSM or masses noted. Msk:  Symmetrical without gross deformities. Normal posture. Extremities:  Without edema. Neurologic:  Alert and  oriented x4 Psych: Normal mood and affect.   Assessment: 75 year old female with history of constipation and adenomatous colon polyps presenting today to schedule surveillance colonoscopy.  Also reporting RUQ abdominal pain.  History of adenomatous colon polyps: Last colonoscopy in May 2019 with five 3-6 mm polyps (4 tubular adenomas, 1 hyperplastic polyp), tortuous left colon, internal hemorrhoids.  Recommended repeat  colonoscopy in 3 years.  No significant lower GI symptoms.  Constipation well controlled with MiraLAX daily.  No alarm symptoms.  We will proceed with colonoscopy in the near future.  RUQ abdominal pain:  2 to 3 weeks of daily intermittent pain in the RUQ/underneath right rib cage without identified trigger.  Denies association with movements or meals.  Denies associated nausea or vomiting.  History of cholecystectomy.  Rare GERD symptoms on Pepcid nightly and Prilosec as needed (rarely).  Denies NSAIDs. Notably, she has had increased cough recently which she reports is secondary to recurrent bronchitis.  On exam, she has mild TTP in RUQ at costal margin with slight tenderness along right lower ribs as well.  Query MSK etiology secondary to coughing.  Additional considerations include gastritis, duodenitis, biliary/hepatic abnormality.  We will update labs and obtain RUQ ultrasound. If no abnormalities, consider trial of Prilosec daily.    Plan:  1.  Colonoscopy with propofol with Dr. Abbey Chatters in the near future. The risks, benefits, and alternatives have been discussed with the patient in detail. The patient states understanding and desires to proceed.  ASA III  2.  CBC, CMP  3.  RUQ ultrasound.  4.  Continue Pepcid nightly and Prilosec as needed.  Consider trial of Prilosec daily if labs and RUQ ultrasound are unrevealing.  5.  Advised to monitor for any worsening abdominal pain and let me know if this occurs.  6.  Continue MiraLAX daily.  7.  Follow-up after colonoscopy or sooner if needed.    Aliene Altes, PA-C [facility] 05/23/2020

## 2020-05-23 ENCOUNTER — Other Ambulatory Visit: Payer: Self-pay

## 2020-05-23 ENCOUNTER — Encounter: Payer: Self-pay | Admitting: Gastroenterology

## 2020-05-23 ENCOUNTER — Ambulatory Visit (INDEPENDENT_AMBULATORY_CARE_PROVIDER_SITE_OTHER): Payer: 59 | Admitting: Gastroenterology

## 2020-05-23 VITALS — BP 159/74 | HR 94 | Temp 97.5°F | Ht 65.0 in | Wt 178.6 lb

## 2020-05-23 DIAGNOSIS — R1011 Right upper quadrant pain: Secondary | ICD-10-CM | POA: Insufficient documentation

## 2020-05-23 DIAGNOSIS — Z86010 Personal history of colonic polyps: Secondary | ICD-10-CM | POA: Diagnosis not present

## 2020-05-23 MED ORDER — CLENPIQ 10-3.5-12 MG-GM -GM/160ML PO SOLN: 1.0000 | Freq: Once | ORAL | 0 refills | Status: AC

## 2020-05-23 NOTE — Patient Instructions (Addendum)
We will arrange for you to have a colonoscopy in near future with Dr. Abbey Chatters.  We will also arrange for you to have an ultrasound to evaluate the pain in your right upper abdomen.  Please have blood work completed at your primary care's office.  Results will need to be faxed back to our office.  Monitor for any worsening abdominal pain and let me know if this occurs.   Will follow-up in the office after your colonoscopy or sooner if needed if abdominal pain is worsening.  Aliene Altes, PA-C [facility]

## 2020-05-24 ENCOUNTER — Telehealth: Payer: Self-pay

## 2020-05-24 NOTE — Telephone Encounter (Signed)
Pre-op/covid test 07/08/20 at 11:15am. Appt letter mailed with new instructions.

## 2020-05-24 NOTE — Telephone Encounter (Signed)
Pt called office to reschedule TCS that was for 07/05/20. TCS rescheduled to 07/12/20 at 12:30pm. Endo scheduler informed.

## 2020-05-25 NOTE — Progress Notes (Signed)
Cc'ed to pcp °

## 2020-05-31 ENCOUNTER — Other Ambulatory Visit: Payer: Self-pay | Admitting: Orthopaedic Surgery

## 2020-05-31 ENCOUNTER — Telehealth: Payer: Self-pay

## 2020-05-31 ENCOUNTER — Other Ambulatory Visit: Payer: Self-pay

## 2020-05-31 ENCOUNTER — Ambulatory Visit (INDEPENDENT_AMBULATORY_CARE_PROVIDER_SITE_OTHER): Payer: 59 | Admitting: Orthopaedic Surgery

## 2020-05-31 ENCOUNTER — Other Ambulatory Visit: Payer: Self-pay | Admitting: Internal Medicine

## 2020-05-31 ENCOUNTER — Encounter: Payer: Self-pay | Admitting: Orthopaedic Surgery

## 2020-05-31 DIAGNOSIS — G8929 Other chronic pain: Secondary | ICD-10-CM

## 2020-05-31 DIAGNOSIS — J449 Chronic obstructive pulmonary disease, unspecified: Secondary | ICD-10-CM

## 2020-05-31 DIAGNOSIS — M25561 Pain in right knee: Secondary | ICD-10-CM | POA: Diagnosis not present

## 2020-05-31 DIAGNOSIS — R918 Other nonspecific abnormal finding of lung field: Secondary | ICD-10-CM

## 2020-05-31 MED ORDER — TRAMADOL HCL 50 MG PO TABS: ORAL_TABLET | ORAL | 2 refills | Status: DC

## 2020-05-31 MED ORDER — CYCLOBENZAPRINE HCL 5 MG PO TABS: 5.0000 mg | ORAL_TABLET | Freq: Every evening | ORAL | 0 refills | Status: DC | PRN

## 2020-05-31 MED ORDER — ALBUTEROL SULFATE HFA 108 (90 BASE) MCG/ACT IN AERS: INHALATION_SPRAY | RESPIRATORY_TRACT | 1 refills | Status: DC

## 2020-05-31 NOTE — Telephone Encounter (Signed)
Refilled

## 2020-05-31 NOTE — Telephone Encounter (Signed)
Pt called and states that [facility] will no longer pay for her to see Dr. Melvyn Novas. He had prescribed her Albuterol inhaler to use as needed and she is needing a refill to have on hand.

## 2020-05-31 NOTE — Progress Notes (Signed)
PROCEDURE NOTE:  The patient requests injections of the right knee , verbal consent was obtained.  The right knee was prepped appropriately after time out was performed.   Sterile technique was observed and injection of 1 cc of Celestone 6 mg with several cc's of plain xylocaine. Anesthesia was provided by ethyl chloride and a 20-gauge needle was used to inject the knee area. The injection was tolerated well.  A band aid dressing was applied.  The patient was advised to apply ice later today and tomorrow to the injection sight as needed.  I have reviewed the [place] web site prior to prescribing narcotic medicine for this patient.   Return in three months.  Call if any problem.  Precautions discussed.   Electronically Signed Sanjuana Kava, MD 5/17/20221:40 PM

## 2020-06-01 ENCOUNTER — Ambulatory Visit (HOSPITAL_COMMUNITY): Payer: 59

## 2020-06-02 ENCOUNTER — Other Ambulatory Visit: Payer: Self-pay | Admitting: *Deleted

## 2020-06-02 ENCOUNTER — Other Ambulatory Visit: Payer: Self-pay

## 2020-06-02 ENCOUNTER — Ambulatory Visit (HOSPITAL_COMMUNITY)
Admission: RE | Admit: 2020-06-02 | Discharge: 2020-06-02 | Disposition: A | Discharge status: Home / Self Care | Payer: 59 | Source: Ambulatory Visit | Attending: Gastroenterology | Admitting: Gastroenterology

## 2020-06-02 DIAGNOSIS — R1011 Right upper quadrant pain: Secondary | ICD-10-CM | POA: Insufficient documentation

## 2020-06-02 DIAGNOSIS — I10 Essential (primary) hypertension: Secondary | ICD-10-CM

## 2020-06-02 DIAGNOSIS — Z0279 Encounter for issue of other medical certificate: Secondary | ICD-10-CM

## 2020-06-03 LAB — CMP14+EGFR
ALT: 16 IU/L (ref 0–32)
AST: 15 IU/L (ref 0–40)
Albumin/Globulin Ratio: 1.5 (ref 1.2–2.2)
Albumin: 4.5 g/dL (ref 3.7–4.7)
Alkaline Phosphatase: 83 IU/L (ref 44–121)
BUN/Creatinine Ratio: 28 (ref 12–28)
BUN: 29 mg/dL — ABNORMAL HIGH (ref 8–27)
Bilirubin Total: 0.3 mg/dL (ref 0.0–1.2)
CO2: 23 mmol/L (ref 20–29)
Calcium: 9.9 mg/dL (ref 8.7–10.3)
Chloride: 101 mmol/L (ref 96–106)
Creatinine, Ser: 1.03 mg/dL — ABNORMAL HIGH (ref 0.57–1.00)
Globulin, Total: 3 g/dL (ref 1.5–4.5)
Glucose: 86 mg/dL (ref 65–99)
Potassium: 5.2 mmol/L (ref 3.5–5.2)
Sodium: 141 mmol/L (ref 134–144)
Total Protein: 7.5 g/dL (ref 6.0–8.5)
eGFR: 57 mL/min/{1.73_m2} — ABNORMAL LOW (ref >59)

## 2020-06-03 LAB — CBC
Hematocrit: 46.6 % (ref 34.0–46.6)
Hemoglobin: 14.8 g/dL (ref 11.1–15.9)
MCH: 29.1 pg (ref 26.6–33.0)
MCHC: 31.8 g/dL (ref 31.5–35.7)
MCV: 92 fL (ref 79–97)
Platelets: 482 10*3/uL — ABNORMAL HIGH (ref 150–450)
RBC: 5.09 x10E6/uL (ref 3.77–5.28)
RDW: 13.9 % (ref 11.7–15.4)
WBC: 12.5 10*3/uL — ABNORMAL HIGH (ref 3.4–10.8)

## 2020-06-03 LAB — LIPID PANEL
Chol/HDL Ratio: 3.4 ratio (ref 0.0–4.4)
Cholesterol, Total: 246 mg/dL — ABNORMAL HIGH (ref 100–199)
HDL: 73 mg/dL (ref >39)
LDL Chol Calc (NIH): 147 mg/dL — ABNORMAL HIGH (ref 0–99)
Triglycerides: 147 mg/dL (ref 0–149)
VLDL Cholesterol Cal: 26 mg/dL (ref 5–40)

## 2020-06-03 LAB — HEMOGLOBIN A1C
Est. average glucose Bld gHb Est-mCnc: 131 mg/dL
Hgb A1c MFr Bld: 6.2 % — ABNORMAL HIGH (ref 4.8–5.6)

## 2020-06-03 NOTE — Telephone Encounter (Signed)
Completed and faxed.

## 2020-06-07 ENCOUNTER — Other Ambulatory Visit: Payer: Self-pay | Admitting: Internal Medicine

## 2020-06-07 DIAGNOSIS — J4489 Other specified chronic obstructive pulmonary disease: Secondary | ICD-10-CM

## 2020-06-07 DIAGNOSIS — R918 Other nonspecific abnormal finding of lung field: Secondary | ICD-10-CM

## 2020-06-07 DIAGNOSIS — J449 Chronic obstructive pulmonary disease, unspecified: Secondary | ICD-10-CM

## 2020-06-14 ENCOUNTER — Telehealth: Payer: Self-pay

## 2020-06-14 NOTE — Telephone Encounter (Signed)
This was sent 06-07-20

## 2020-06-14 NOTE — Telephone Encounter (Signed)
Please send Symbicort to [facility]

## 2020-06-17 ENCOUNTER — Other Ambulatory Visit: Payer: Self-pay | Admitting: Internal Medicine

## 2020-06-17 ENCOUNTER — Other Ambulatory Visit: Payer: Self-pay | Admitting: Family Medicine

## 2020-06-17 DIAGNOSIS — J449 Chronic obstructive pulmonary disease, unspecified: Secondary | ICD-10-CM

## 2020-06-17 DIAGNOSIS — R918 Other nonspecific abnormal finding of lung field: Secondary | ICD-10-CM

## 2020-06-17 DIAGNOSIS — J4489 Other specified chronic obstructive pulmonary disease: Secondary | ICD-10-CM

## 2020-06-29 ENCOUNTER — Other Ambulatory Visit (HOSPITAL_COMMUNITY): Payer: Self-pay | Admitting: Hematology

## 2020-06-29 DIAGNOSIS — Z1231 Encounter for screening mammogram for malignant neoplasm of breast: Secondary | ICD-10-CM

## 2020-06-30 ENCOUNTER — Other Ambulatory Visit (HOSPITAL_COMMUNITY): Payer: Self-pay

## 2020-06-30 DIAGNOSIS — C50212 Malignant neoplasm of upper-inner quadrant of left female breast: Secondary | ICD-10-CM

## 2020-07-01 ENCOUNTER — Ambulatory Visit (HOSPITAL_COMMUNITY): Payer: Medicare Other

## 2020-07-01 ENCOUNTER — Other Ambulatory Visit: Payer: Self-pay

## 2020-07-01 ENCOUNTER — Other Ambulatory Visit (HOSPITAL_COMMUNITY): Payer: 59

## 2020-07-01 ENCOUNTER — Ambulatory Visit (HOSPITAL_COMMUNITY)
Admission: RE | Admit: 2020-07-01 | Discharge: 2020-07-01 | Disposition: A | Discharge status: Home / Self Care | Payer: 59 | Source: Ambulatory Visit | Attending: Nurse Practitioner | Admitting: Nurse Practitioner

## 2020-07-01 ENCOUNTER — Inpatient Hospital Stay (HOSPITAL_COMMUNITY): Payer: 59 | Attending: Hematology

## 2020-07-01 DIAGNOSIS — C50212 Malignant neoplasm of upper-inner quadrant of left female breast: Secondary | ICD-10-CM | POA: Insufficient documentation

## 2020-07-01 DIAGNOSIS — Z170 Estrogen receptor positive status [ER+]: Secondary | ICD-10-CM | POA: Diagnosis not present

## 2020-07-01 DIAGNOSIS — Z1231 Encounter for screening mammogram for malignant neoplasm of breast: Secondary | ICD-10-CM | POA: Diagnosis present

## 2020-07-01 LAB — CBC WITH DIFFERENTIAL/PLATELET
Abs Immature Granulocytes: 0.03 10*3/uL (ref 0.00–0.07)
Basophils Absolute: 0.1 10*3/uL (ref 0.0–0.1)
Basophils Relative: 1 %
Eosinophils Absolute: 0.3 10*3/uL (ref 0.0–0.5)
Eosinophils Relative: 3 %
HCT: 48.4 % — ABNORMAL HIGH (ref 36.0–46.0)
Hemoglobin: 15.3 g/dL — ABNORMAL HIGH (ref 12.0–15.0)
Immature Granulocytes: 0 %
Lymphocytes Relative: 22 %
Lymphs Abs: 2.4 10*3/uL (ref 0.7–4.0)
MCH: 29.8 pg (ref 26.0–34.0)
MCHC: 31.6 g/dL (ref 30.0–36.0)
MCV: 94.2 fL (ref 80.0–100.0)
Monocytes Absolute: 1.2 10*3/uL — ABNORMAL HIGH (ref 0.1–1.0)
Monocytes Relative: 11 %
Neutro Abs: 6.7 10*3/uL (ref 1.7–7.7)
Neutrophils Relative %: 63 %
Platelets: 452 10*3/uL — ABNORMAL HIGH (ref 150–400)
RBC: 5.14 MIL/uL — ABNORMAL HIGH (ref 3.87–5.11)
RDW: 14.6 % (ref 11.5–15.5)
WBC: 10.7 10*3/uL — ABNORMAL HIGH (ref 4.0–10.5)
nRBC: 0 % (ref 0.0–0.2)

## 2020-07-01 LAB — VITAMIN D 25 HYDROXY (VIT D DEFICIENCY, FRACTURES): Vit D, 25-Hydroxy: 30.81 ng/mL (ref 30–100)

## 2020-07-01 LAB — COMPREHENSIVE METABOLIC PANEL
ALT: 26 U/L (ref 0–44)
AST: 20 U/L (ref 15–41)
Albumin: 4.2 g/dL (ref 3.5–5.0)
Alkaline Phosphatase: 72 U/L (ref 38–126)
Anion gap: 9 (ref 5–15)
BUN: 31 mg/dL — ABNORMAL HIGH (ref 8–23)
CO2: 26 mmol/L (ref 22–32)
Calcium: 9.5 mg/dL (ref 8.9–10.3)
Chloride: 104 mmol/L (ref 98–111)
Creatinine, Ser: 0.9 mg/dL (ref 0.44–1.00)
GFR, Estimated: >60 mL/min (ref >60)
Glucose, Bld: 117 mg/dL — ABNORMAL HIGH (ref 70–99)
Potassium: 4.4 mmol/L (ref 3.5–5.1)
Sodium: 139 mmol/L (ref 135–145)
Total Bilirubin: 0.5 mg/dL (ref 0.3–1.2)
Total Protein: 7.8 g/dL (ref 6.5–8.1)

## 2020-07-01 LAB — VITAMIN B12: Vitamin B-12: 140 pg/mL — ABNORMAL LOW (ref 180–914)

## 2020-07-01 LAB — LACTATE DEHYDROGENASE: LDH: 156 U/L (ref 98–192)

## 2020-07-04 ENCOUNTER — Other Ambulatory Visit (HOSPITAL_COMMUNITY): Payer: Self-pay | Admitting: Hematology

## 2020-07-04 DIAGNOSIS — R928 Other abnormal and inconclusive findings on diagnostic imaging of breast: Secondary | ICD-10-CM

## 2020-07-05 NOTE — Patient Instructions (Signed)
[name]  07/05/2020     @PREFPERIOPPHARMACY @   Your procedure is scheduled on  07/12/2020.   Report to [facility] at  1015 A.M.   Call this number if you have problems the morning of surgery:  [phone]   Remember:  Follow the diet and prep instructions given to you by the office.   DO NOT take any medications for diabetes the morning of your procedure.      Take these medicines the morning of surgery with A SIP OF WATER     flexeril(if needed), prilosec, tramadol(if needed).     Please brush your teeth.  Do not wear jewelry, make-up or nail polish.  Do not wear lotions, powders, or perfumes, or deodorant.  Do not shave 48 hours prior to surgery.  Men may shave face and neck.  Do not bring valuables to the hospital.  [facility] is not responsible for any belongings or valuables.  Contacts, dentures or bridgework may not be worn into surgery.  Leave your suitcase in the car.  After surgery it may be brought to your room.  For patients admitted to the hospital, discharge time will be determined by your treatment team.  Patients discharged the day of surgery will not be allowed to drive home and must have someone with them for 24 hours.    Special instructions:  DO NOT smoke tobacco or vape for 24 hours before your procedure.  Please read over the following fact sheets that you were given. Anesthesia Post-op Instructions and Care and Recovery After Surgery      Colonoscopy, Adult, Care After This sheet gives you information about how to care for yourself after your procedure. Your health care provider may also give you more specific instructions. If you have problems or questions, contact your health careprovider. What can I expect after the procedure? After the procedure, it is common to have: A small amount of blood in your stool for 24 hours after the procedure. Some gas. Mild cramping or bloating of your abdomen. Follow these instructions at  home: Eating and drinking  Drink enough fluid to keep your urine pale yellow. Follow instructions from your health care provider about eating or drinking restrictions. Resume your normal diet as instructed by your health care provider. Avoid heavy or fried foods that are hard to digest.  Activity Rest as told by your health care provider. Avoid sitting for a long time without moving. Get up to take short walks every 1-2 hours. This is important to improve blood flow and breathing. Ask for help if you feel weak or unsteady. Return to your normal activities as told by your health care provider. Ask your health care provider what activities are safe for you. Managing cramping and bloating  Try walking around when you have cramps or feel bloated. Apply heat to your abdomen as told by your health care provider. Use the heat source that your health care provider recommends, such as a moist heat pack or a heating pad. Place a towel between your skin and the heat source. Leave the heat on for 20-30 minutes. Remove the heat if your skin turns bright red. This is especially important if you are unable to feel pain, heat, or cold. You may have a greater risk of getting burned.  General instructions If you were given a sedative during the procedure, it can affect you for several hours. Do not drive or operate machinery until your health  care provider says that it is safe. For the first 24 hours after the procedure: Do not sign important documents. Do not drink alcohol. Do your regular daily activities at a slower pace than normal. Eat soft foods that are easy to digest. Take over-the-counter and prescription medicines only as told by your health care provider. Keep all follow-up visits as told by your health care provider. This is important. Contact a health care provider if: You have blood in your stool 2-3 days after the procedure. Get help right away if you have: More than a small spotting of  blood in your stool. Large blood clots in your stool. Swelling of your abdomen. Nausea or vomiting. A fever. Increasing pain in your abdomen that is not relieved with medicine. Summary After the procedure, it is common to have a small amount of blood in your stool. You may also have mild cramping and bloating of your abdomen. If you were given a sedative during the procedure, it can affect you for several hours. Do not drive or operate machinery until your health care provider says that it is safe. Get help right away if you have a lot of blood in your stool, nausea or vomiting, a fever, or increased pain in your abdomen. This information is not intended to replace advice given to you by your health care provider. Make sure you discuss any questions you have with your healthcare provider. Document Revised: 12/26/2018 Document Reviewed: 07/28/2018 Elsevier Patient Education  [place] After This sheet gives you information about how to care for yourself after your procedure. Your health care provider may also give you more specific instructions. If you have problems or questions, contact your health careprovider. What can I expect after the procedure? After the procedure, it is common to have: Tiredness. Forgetfulness about what happened after the procedure. Impaired judgment for important decisions. Nausea or vomiting. Some difficulty with balance. Follow these instructions at home: For the time period you were told by your health care provider:     Rest as needed. Do not participate in activities where you could fall or become injured. Do not drive or use machinery. Do not drink alcohol. Do not take sleeping pills or medicines that cause drowsiness. Do not make important decisions or sign legal documents. Do not take care of children on your own. Eating and drinking Follow the diet that is recommended by your health care provider. Drink  enough fluid to keep your urine pale yellow. If you vomit: Drink water, juice, or soup when you can drink without vomiting. Make sure you have little or no nausea before eating solid foods. General instructions Have a responsible adult stay with you for the time you are told. It is important to have someone help care for you until you are awake and alert. Take over-the-counter and prescription medicines only as told by your health care provider. If you have sleep apnea, surgery and certain medicines can increase your risk for breathing problems. Follow instructions from your health care provider about wearing your sleep device: Anytime you are sleeping, including during daytime naps. While taking prescription pain medicines, sleeping medicines, or medicines that make you drowsy. Avoid smoking. Keep all follow-up visits as told by your health care provider. This is important. Contact a health care provider if: You keep feeling nauseous or you keep vomiting. You feel light-headed. You are still sleepy or having trouble with balance after 24 hours. You develop a rash. You have  a fever. You have redness or swelling around the IV site. Get help right away if: You have trouble breathing. You have new-onset confusion at home. Summary For several hours after your procedure, you may feel tired. You may also be forgetful and have poor judgment. Have a responsible adult stay with you for the time you are told. It is important to have someone help care for you until you are awake and alert. Rest as told. Do not drive or operate machinery. Do not drink alcohol or take sleeping pills. Get help right away if you have trouble breathing, or if you suddenly become confused. This information is not intended to replace advice given to you by your health care provider. Make sure you discuss any questions you have with your healthcare provider. Document Revised: 09/17/2019 Document Reviewed:  12/04/2018 Elsevier Patient Education  2022 [employer].

## 2020-07-07 ENCOUNTER — Other Ambulatory Visit: Payer: Self-pay

## 2020-07-07 ENCOUNTER — Telehealth: Payer: Self-pay

## 2020-07-07 NOTE — Telephone Encounter (Signed)
Informed by Maudie Mercury at pre-service center, TCS scheduled for 07/12/20 now requires PA from [facility]  PA for TCS submitted via [facility] website. PA# [id number], valid 07/12/20-10/10/20.  Message sent to Maudie Mercury to inform her of PA.

## 2020-07-07 NOTE — Progress Notes (Unsigned)
Pre pro

## 2020-07-08 ENCOUNTER — Encounter (HOSPITAL_COMMUNITY): Payer: Self-pay

## 2020-07-08 ENCOUNTER — Other Ambulatory Visit: Payer: Self-pay

## 2020-07-08 ENCOUNTER — Encounter (HOSPITAL_COMMUNITY)
Admission: RE | Admit: 2020-07-08 | Discharge: 2020-07-08 | Disposition: A | Discharge status: Home / Self Care | Payer: 59 | Source: Ambulatory Visit | Attending: Internal Medicine | Admitting: Internal Medicine

## 2020-07-08 ENCOUNTER — Encounter: Payer: Self-pay | Admitting: Internal Medicine

## 2020-07-08 ENCOUNTER — Other Ambulatory Visit (HOSPITAL_COMMUNITY): Payer: 59

## 2020-07-08 ENCOUNTER — Telehealth (INDEPENDENT_AMBULATORY_CARE_PROVIDER_SITE_OTHER): Payer: 59 | Admitting: Internal Medicine

## 2020-07-08 DIAGNOSIS — G309 Alzheimer's disease, unspecified: Secondary | ICD-10-CM | POA: Diagnosis not present

## 2020-07-08 DIAGNOSIS — Z741 Need for assistance with personal care: Secondary | ICD-10-CM

## 2020-07-08 DIAGNOSIS — Z01810 Encounter for preprocedural cardiovascular examination: Secondary | ICD-10-CM | POA: Insufficient documentation

## 2020-07-08 DIAGNOSIS — F0280 Dementia in other diseases classified elsewhere without behavioral disturbance: Secondary | ICD-10-CM

## 2020-07-08 DIAGNOSIS — N3000 Acute cystitis without hematuria: Secondary | ICD-10-CM | POA: Diagnosis not present

## 2020-07-08 MED ORDER — NITROFURANTOIN MONOHYD MACRO 100 MG PO CAPS: 100.0000 mg | ORAL_CAPSULE | Freq: Two times a day (BID) | ORAL | 0 refills | Status: DC

## 2020-07-08 MED ORDER — UNABLE TO FIND: 0 refills | Status: DC

## 2020-07-08 NOTE — Assessment & Plan Note (Addendum)
Has been forgetting street, leaving stove on and needs help with ADLs Will check MMSE in the office Depending on that, may consider Donepezil Referred to home health for evaluation

## 2020-07-08 NOTE — Addendum Note (Signed)
Addended by: Lonn Georgia on: 07/08/2020 11:51 AM   Modules accepted: Orders

## 2020-07-08 NOTE — Progress Notes (Signed)
Virtual Visit via Telephone Note   This visit type was conducted due to national recommendations for restrictions regarding the COVID-19 Pandemic (e.g. social distancing) in an effort to limit this patient's exposure and mitigate transmission in our community.  Due to her co-morbid illnesses, this patient is at least at moderate risk for complications without adequate follow up.  This format is felt to be most appropriate for this patient at this time.  The patient did not have access to video technology/had technical difficulties with video requiring transitioning to audio format only (telephone).  All issues noted in this document were discussed and addressed.  No physical exam could be performed with this format.  Evaluation Performed:  Follow-up visit  Date:  07/08/2020   ID:  [name], [name] 04/25/1945, MRN [id number]  Patient Location: Home Provider Location: Office/Clinic  Participants: Patient and Granddaughter - [name] Location of Patient: Home Location of Provider: Telehealth Consent was obtain for visit to be over via telehealth. I verified that I am speaking with the correct person using two identifiers.  PCP:  [name], MD   Chief Complaint:  Dysuria, AMS  History of Present Illness:    [name] is a 74 y.o. female who has a televisit for c/o dysuria and change in mental status for last few days. She has been more confused for last 1 week. Denies any hematuria or flank pain. Denies any fever, chills, nausea or vomiting.  Granddaughter reports that she has been forgetting things at home, including leaving stove on, forgetting street and has been having difficulty with ADLs. Granddaughter has to help with ADLs including bathing, cooking, etc. She was referred to home health, but due to communication gap, she has not been evaluated yet. Her contact information has been updated today with Granddaughter's contact in the EMR as well.  The patient does not  have symptoms concerning for COVID-19 infection (fever, chills, cough, or new shortness of breath).   Past Medical, Surgical, Social History, Allergies, and Medications have been Reviewed.  Past Medical History:  Diagnosis Date   Breast cancer ([place]) 2007   Stage I (T1b N0 M0), grade 1 well-differentiated carcinoma of the left breast status, post lumpectomy followed by radiation therapy. Her estrogen receptor receptors were 93%, progesterone receptors 67%. HER-2/neu was negative. No lymphovascular space invasion was seen. All margins were clear. Ki-67 marker was low at 1% with surgery on 11/15/2004. Treated then with post-lumpectomy radiation, finish   Breast cancer, left breast ([place]) 2007   COPD (chronic obstructive pulmonary disease) ([place])    Diabetes mellitus without complication ([place])    diet controlled   Diverticula of colon    Hx of rickettsial disease    Hyperlipidemia    Hypertension    Kidney stones    Nicotine dependence    Osteoarthritis    Past Surgical History:  Procedure Laterality Date   ABDOMINAL HYSTERECTOMY     BREAST SURGERY  2005 approx   left lumpectomy    CATARACT EXTRACTION W/PHACO Left 05/01/2019   Procedure: CATARACT EXTRACTION PHACO AND INTRAOCULAR LENS PLACEMENT LEFT EYE CDE=11.73;  Surgeon: Baruch Goldmann, MD;  Location: [facility];  Service: Ophthalmology;  Laterality: Left;  left   CATARACT EXTRACTION W/PHACO Right 05/15/2019   Procedure: CATARACT EXTRACTION PHACO AND INTRAOCULAR LENS PLACEMENT RIGHT EYE;  Surgeon: Baruch Goldmann, MD;  Location: [facility];  Service: Ophthalmology;  Laterality: Right;  CDE: 16.07   CHOLECYSTECTOMY N/A 05/26/2014   Procedure: LAPAROSCOPIC CHOLECYSTECTOMY;  Surgeon: Aviva Signs Md, MD;  Location: [facility];  Service: General;  Laterality: N/A;   COLECTOMY     2005, diverticulitis   COLONOSCOPY N/A 05/20/2017    Surgeon: Danie Binder, MD;  five 3-6 mm polyps (4 tubular adenomas, 1 hyperplastic polyp), tortuous left colon, internal  hemorrhoids.  Recommended repeat colonoscopy in 3 years.   DILATION AND CURETTAGE OF UTERUS     ESOPHAGOGASTRODUODENOSCOPY N/A 05/20/2017   Surgeon: Danie Binder, MD; normal esophagus and mild gastritis due to aspirin.  Biopsies negative for H. pylori.   left breast      cancer, in 2006   TUBAL LIGATION       Current Meds  Medication Sig   acetaminophen (TYLENOL) 325 MG tablet Take 650 mg by mouth every 6 (six) hours as needed for moderate pain.    albuterol (PROAIR HFA) 108 (90 Base) MCG/ACT inhaler 2 puffs every 4 hours as needed only  if your can't catch your breath (Patient taking differently: Inhale 2 puffs into the lungs every 4 (four) hours as needed for wheezing or shortness of breath.)   albuterol (PROVENTIL) (2.5 MG/3ML) 0.083% nebulizer solution INHALE ONE VIAL VIA NEBULIZER THREE TIMES DAILY. (Patient taking differently: Take 2.5 mg by nebulization 3 (three) times daily as needed for wheezing or shortness of breath.)   alendronate (FOSAMAX) 70 MG tablet Take 1 tablet (70 mg total) by mouth every 7 (seven) days. Take with a full glass of water on an empty stomach. (Patient taking differently: Take 70 mg by mouth every Sunday. Take with a full glass of water on an empty stomach.)   aspirin EC 81 MG tablet Take 81 mg by mouth daily.   ATROVENT HFA 17 MCG/ACT inhaler INHALE 2 PUFFS BY MOUTH FOUR TIMES A DAY. (Patient taking differently: Inhale 2 puffs into the lungs 4 (four) times daily.)   Calcium Carbonate-Vitamin D (CALCIUM 600 + D PO) Take 1 tablet by mouth 2 (two) times daily.   cyclobenzaprine (FLEXERIL) 5 MG tablet Take 1 tablet (5 mg total) by mouth at bedtime as needed. (Patient taking differently: Take 5 mg by mouth at bedtime as needed for muscle spasms.)   diphenhydramine-acetaminophen (TYLENOL PM) 25-500 MG TABS tablet Take 1 tablet by mouth at bedtime as needed (pain).   Emollient (VASELINE INTENSIVE CARE EX) Apply 1 application topically daily.   famotidine (PEPCID) 20 MG  tablet Take 20 mg by mouth at bedtime.    fluticasone (FLONASE) 50 MCG/ACT nasal spray Place 2 sprays into both nostrils daily.   hydrochlorothiazide (HYDRODIURIL) 25 MG tablet TAKE 1 TABLET BY MOUTH ONCE A DAY. (Patient taking differently: Take 25 mg by mouth daily.)   irbesartan (AVAPRO) 300 MG tablet Take 1 tablet (300 mg total) by mouth daily.   loratadine (CLARITIN) 10 MG tablet TAKE 1 TABLET BY MOUTH ONCE DAILY FOR ALLERGIES. (Patient taking differently: Take 10 mg by mouth daily.)   montelukast (SINGULAIR) 10 MG tablet TAKE 1 TABLET BY MOUTH AT BEDTIME. (Patient taking differently: Take 10 mg by mouth at bedtime.)   Naphazoline HCl (CLEAR EYES OP) Place 1 drop into both eyes daily as needed (irritation).   nitrofurantoin, macrocrystal-monohydrate, (MACROBID) 100 MG capsule Take 1 capsule (100 mg total) by mouth 2 (two) times daily.   omeprazole (PRILOSEC) 20 MG capsule Take 20 mg by mouth daily as needed (acid reflux).   polyethylene glycol powder (GLYCOLAX/MIRALAX) powder MIX 1 CAPFUL IN 8 OUNCES OF JUICE OR WATER AND DRINK ONCE DAILY. (  Patient taking differently: Take 17 g by mouth daily.)   potassium chloride SA (KLOR-CON) 20 MEQ tablet TAKE 1 TABLET BY MOUTH DAILY. (Patient taking differently: Take 20 mEq by mouth daily.)   SYMBICORT 160-4.5 MCG/ACT inhaler INHALE 2 PUFFS INTO THE LUNGS TWICE DAILY. (Patient taking differently: Inhale 2 puffs into the lungs 2 (two) times daily.)   traMADol (ULTRAM) 50 MG tablet One tablet every six hours as needed for pain. (Patient taking differently: Take 50 mg by mouth every 6 (six) hours as needed for moderate pain.)     Allergies:   Penicillins and Sulfonamide derivatives   ROS:   Please see the history of present illness.     All other systems reviewed and are negative.   Labs/Other Tests and Data Reviewed:    Recent Labs: 02/16/2020: TSH 1.830 07/01/2020: ALT 26; BUN 31; Creatinine, Ser 0.90; Hemoglobin 15.3; Platelets 452; Potassium 4.4;  Sodium 139   Recent Lipid Panel Lab Results  Component Value Date/Time   CHOL 246 (H) 06/02/2020 01:07 PM   TRIG 147 06/02/2020 01:07 PM   HDL 73 06/02/2020 01:07 PM   CHOLHDL 3.4 06/02/2020 01:07 PM   CHOLHDL 2.3 09/25/2019 09:58 AM   LDLCALC 147 (H) 06/02/2020 01:07 PM   LDLCALC 74 09/25/2019 09:58 AM    Wt Readings from Last 3 Encounters:  05/23/20 178 lb 9.6 oz (81 kg)  05/05/20 177 lb 6.4 oz (80.5 kg)  04/04/20 174 lb (78.9 kg)      ASSESSMENT & PLAN:    UTI UA reviewed - moderate LE Started Macrobid Check urine culture  Alzheimer's dementia without behavioral disturbance (HCC) Dependence for ADLs Has been forgetting street, leaving stove on and needs help with ADLs Will check MMSE in the office Depending on that, may consider Donepezil Referred to home health for evaluation   Time:   Today, I have spent 18 minutes reviewing the chart, including problem list, medications, and with the patient with telehealth technology discussing the above problems.   Medication Adjustments/Labs and Tests Ordered: Current medicines are reviewed at length with the patient today.  Concerns regarding medicines are outlined above.   Tests Ordered: No orders of the defined types were placed in this encounter.   Medication Changes: Meds ordered this encounter  Medications   nitrofurantoin, macrocrystal-monohydrate, (MACROBID) 100 MG capsule    Sig: Take 1 capsule (100 mg total) by mouth 2 (two) times daily.    Dispense:  10 capsule    Refill:  0     Note: This dictation was prepared with Dragon dictation along with smaller phrase technology. Similar sounding words can be transcribed inadequately or may not be corrected upon review. Any transcriptional errors that result from this process are unintentional.      Disposition:  Follow up  Signed, Lindell Spar, MD  07/08/2020 11:01 AM     [place]

## 2020-07-08 NOTE — Addendum Note (Signed)
Addended by: Lonn Georgia on: 07/08/2020 11:16 AM   Modules accepted: Orders

## 2020-07-10 ENCOUNTER — Emergency Department (HOSPITAL_COMMUNITY)
Admission: EM | Admit: 2020-07-10 | Discharge: 2020-07-10 | Disposition: A | Discharge status: Home / Self Care | Payer: 59 | Attending: Emergency Medicine | Admitting: Emergency Medicine

## 2020-07-10 ENCOUNTER — Encounter (HOSPITAL_COMMUNITY): Payer: Self-pay | Admitting: *Deleted

## 2020-07-10 ENCOUNTER — Emergency Department (HOSPITAL_COMMUNITY): Payer: 59

## 2020-07-10 ENCOUNTER — Other Ambulatory Visit: Payer: Self-pay

## 2020-07-10 DIAGNOSIS — F0280 Dementia in other diseases classified elsewhere without behavioral disturbance: Secondary | ICD-10-CM | POA: Diagnosis not present

## 2020-07-10 DIAGNOSIS — I10 Essential (primary) hypertension: Secondary | ICD-10-CM | POA: Insufficient documentation

## 2020-07-10 DIAGNOSIS — R1084 Generalized abdominal pain: Secondary | ICD-10-CM

## 2020-07-10 DIAGNOSIS — J45909 Unspecified asthma, uncomplicated: Secondary | ICD-10-CM | POA: Insufficient documentation

## 2020-07-10 DIAGNOSIS — F17210 Nicotine dependence, cigarettes, uncomplicated: Secondary | ICD-10-CM | POA: Insufficient documentation

## 2020-07-10 DIAGNOSIS — Z853 Personal history of malignant neoplasm of breast: Secondary | ICD-10-CM | POA: Diagnosis not present

## 2020-07-10 DIAGNOSIS — J449 Chronic obstructive pulmonary disease, unspecified: Secondary | ICD-10-CM | POA: Insufficient documentation

## 2020-07-10 DIAGNOSIS — Z7982 Long term (current) use of aspirin: Secondary | ICD-10-CM | POA: Diagnosis not present

## 2020-07-10 DIAGNOSIS — Z7951 Long term (current) use of inhaled steroids: Secondary | ICD-10-CM | POA: Insufficient documentation

## 2020-07-10 DIAGNOSIS — Z79899 Other long term (current) drug therapy: Secondary | ICD-10-CM | POA: Insufficient documentation

## 2020-07-10 DIAGNOSIS — Z87442 Personal history of urinary calculi: Secondary | ICD-10-CM | POA: Diagnosis not present

## 2020-07-10 DIAGNOSIS — R300 Dysuria: Secondary | ICD-10-CM | POA: Diagnosis not present

## 2020-07-10 DIAGNOSIS — G309 Alzheimer's disease, unspecified: Secondary | ICD-10-CM | POA: Diagnosis not present

## 2020-07-10 DIAGNOSIS — E119 Type 2 diabetes mellitus without complications: Secondary | ICD-10-CM | POA: Diagnosis not present

## 2020-07-10 DIAGNOSIS — R197 Diarrhea, unspecified: Secondary | ICD-10-CM | POA: Diagnosis not present

## 2020-07-10 LAB — COMPREHENSIVE METABOLIC PANEL
ALT: 21 U/L (ref 0–44)
AST: 20 U/L (ref 15–41)
Albumin: 4.2 g/dL (ref 3.5–5.0)
Alkaline Phosphatase: 71 U/L (ref 38–126)
Anion gap: 7 (ref 5–15)
BUN: 17 mg/dL (ref 8–23)
CO2: 28 mmol/L (ref 22–32)
Calcium: 9.6 mg/dL (ref 8.9–10.3)
Chloride: 103 mmol/L (ref 98–111)
Creatinine, Ser: 0.7 mg/dL (ref 0.44–1.00)
GFR, Estimated: >60 mL/min (ref >60)
Glucose, Bld: 99 mg/dL (ref 70–99)
Potassium: 4.4 mmol/L (ref 3.5–5.1)
Sodium: 138 mmol/L (ref 135–145)
Total Bilirubin: 0.4 mg/dL (ref 0.3–1.2)
Total Protein: 7.4 g/dL (ref 6.5–8.1)

## 2020-07-10 LAB — URINALYSIS, ROUTINE W REFLEX MICROSCOPIC
Bilirubin Urine: NEGATIVE
Glucose, UA: NEGATIVE mg/dL
Hgb urine dipstick: NEGATIVE
Ketones, ur: NEGATIVE mg/dL
Leukocytes,Ua: NEGATIVE
Nitrite: NEGATIVE
Protein, ur: 100 mg/dL — AB
Specific Gravity, Urine: 1.017 (ref 1.005–1.030)
pH: 6 (ref 5.0–8.0)

## 2020-07-10 LAB — CBC WITH DIFFERENTIAL/PLATELET
Abs Immature Granulocytes: 0.03 10*3/uL (ref 0.00–0.07)
Basophils Absolute: 0.1 10*3/uL (ref 0.0–0.1)
Basophils Relative: 1 %
Eosinophils Absolute: 0.2 10*3/uL (ref 0.0–0.5)
Eosinophils Relative: 2 %
HCT: 47.5 % — ABNORMAL HIGH (ref 36.0–46.0)
Hemoglobin: 15 g/dL (ref 12.0–15.0)
Immature Granulocytes: 0 %
Lymphocytes Relative: 21 %
Lymphs Abs: 2.1 10*3/uL (ref 0.7–4.0)
MCH: 29.8 pg (ref 26.0–34.0)
MCHC: 31.6 g/dL (ref 30.0–36.0)
MCV: 94.2 fL (ref 80.0–100.0)
Monocytes Absolute: 1.3 10*3/uL — ABNORMAL HIGH (ref 0.1–1.0)
Monocytes Relative: 13 %
Neutro Abs: 6.1 10*3/uL (ref 1.7–7.7)
Neutrophils Relative %: 63 %
Platelets: 454 10*3/uL — ABNORMAL HIGH (ref 150–400)
RBC: 5.04 MIL/uL (ref 3.87–5.11)
RDW: 14.4 % (ref 11.5–15.5)
WBC: 9.9 10*3/uL (ref 4.0–10.5)
nRBC: 0 % (ref 0.0–0.2)

## 2020-07-10 LAB — LIPASE, BLOOD: Lipase: 30 U/L (ref 11–51)

## 2020-07-10 MED ORDER — IOHEXOL 300 MG/ML  SOLN
100.0000 mL | Freq: Once | INTRAMUSCULAR | Status: AC | PRN
  Administered 2020-07-10: 100 mL via INTRAVENOUS

## 2020-07-10 MED ORDER — SODIUM CHLORIDE 0.9 % IV BOLUS: 500.0000 mL | Freq: Once | INTRAVENOUS | Status: DC

## 2020-07-10 MED ORDER — KETOROLAC TROMETHAMINE 30 MG/ML IJ SOLN
30.0000 mg | Freq: Once | INTRAMUSCULAR | Status: AC
Start: 2020-07-10 — End: 2020-07-10
  Administered 2020-07-10: 30 mg via INTRAVENOUS
  Filled 2020-07-10: qty 1

## 2020-07-10 NOTE — ED Triage Notes (Signed)
Pt brought in by [facility] from home with c/o RLQ abdominal pain and urinary incontinence since Thursday. Pt was diagnosed with UTI on Friday and given antibiotic. Pt reports her stool was green in color this morning. BP 193/103, HR 97, O2 sat 96% on RA, CBG 143 for EMS.

## 2020-07-10 NOTE — Discharge Instructions (Addendum)
You have been seen and discharged from the emergency department.  Your blood work and CAT scan were normal.  Continue taking your medications including your prescription for UTI as prescribed.  Follow-up with your primary provider for reevaluation and further care. If you have any worsening symptoms or further concerns for your health please return to an emergency department for further evaluation.

## 2020-07-10 NOTE — ED Provider Notes (Signed)
[facility] EMERGENCY DEPARTMENT Provider Note   CSN: [id number] Arrival date & time: 07/10/20  2536     History Chief Complaint  Patient presents with   Abdominal Pain    [name] is a 75 y.o. female.  HPI  75 year old female with past medical history of HTN, HLD, DM, kidney stones presents emergency department abdominal pain.  Patient states she was seen virtually 2 days ago, diagnosed with a urinary tract infection, is currently taking antibiotics on day 3.  For the past couple days she has been having generalized abdominal discomfort and some dysuria.  She is also been having scattered episodes of nonbloody diarrhea that was worse this morning.  No fever, no nausea/vomiting.  States this does not feel like a typical kidney stone.  Past Medical History:  Diagnosis Date   Breast cancer ([place]) 2007   Stage I (T1b N0 M0), grade 1 well-differentiated carcinoma of the left breast status, post lumpectomy followed by radiation therapy. Her estrogen receptor receptors were 93%, progesterone receptors 67%. HER-2/neu was negative. No lymphovascular space invasion was seen. All margins were clear. Ki-67 marker was low at 1% with surgery on 11/15/2004. Treated then with post-lumpectomy radiation, finish   Breast cancer, left breast ([place]) 2007   COPD (chronic obstructive pulmonary disease) ([place])    Diabetes mellitus without complication (HCC)    diet controlled   Diverticula of colon    Hx of rickettsial disease    Hyperlipidemia    Hypertension    Kidney stones    Nicotine dependence    Osteoarthritis     Patient Active Problem List   Diagnosis Date Noted   Alzheimer's dementia without behavioral disturbance ([place]) 07/08/2020   Right upper quadrant abdominal pain 05/23/2020   History of adenomatous polyp of colon 05/23/2020   Aortic atherosclerosis ([place]) 02/16/2020   Asthmatic bronchitis , chronic ([place]) 11/19/2019   Cigarette smoker 11/19/2019   Educated about COVID-19 virus  infection 08/20/2019   Prediabetes 06/23/2019   Bilateral leg edema 09/14/2018   Muscle spasm 09/14/2018   Coronary artery calcification seen on CAT scan 11/09/2017   Colon adenomas 11/07/2017   Bloating symptom 11/07/2017   Allergic rhinitis 01/04/2015   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[place]) 09/26/2014   Hydrosalpinx 09/28/2013   Acute cystitis without hematuria 08/03/2013   Osteopenia 07/15/2013   postmenopausal ovarian cyst 04/06/2013   Pulmonary nodules 04/04/2012   Breast cancer ([place]) 03/11/2012   Ganglion cyst 05/08/2011   COPD (chronic obstructive pulmonary disease) with chronic bronchitis ([place]) 07/24/2009   Hyperlipidemia LDL goal <100 03/09/2009   CONSTIPATION, CHRONIC 03/09/2009   HTN (hypertension) 09/29/2008   GENERALIZED OSTEOARTHROSIS UNSPECIFIED SITE 09/29/2008    Past Surgical History:  Procedure Laterality Date   ABDOMINAL HYSTERECTOMY     BREAST SURGERY  2005 approx   left lumpectomy    CATARACT EXTRACTION W/PHACO Left 05/01/2019   Procedure: CATARACT EXTRACTION PHACO AND INTRAOCULAR LENS PLACEMENT LEFT EYE CDE=11.73;  Surgeon: Baruch Goldmann, MD;  Location: [facility];  Service: Ophthalmology;  Laterality: Left;  left   CATARACT EXTRACTION W/PHACO Right 05/15/2019   Procedure: CATARACT EXTRACTION PHACO AND INTRAOCULAR LENS PLACEMENT RIGHT EYE;  Surgeon: Baruch Goldmann, MD;  Location: [facility];  Service: Ophthalmology;  Laterality: Right;  CDE: 16.07   CHOLECYSTECTOMY N/A 05/26/2014   Procedure: LAPAROSCOPIC CHOLECYSTECTOMY;  Surgeon: Aviva Signs Md, MD;  Location: [facility];  Service: General;  Laterality: N/A;   COLECTOMY     2005, diverticulitis   COLONOSCOPY N/A 05/20/2017  Surgeon: Danie Binder, MD;  five 3-6 mm polyps (4 tubular adenomas, 1 hyperplastic polyp), tortuous left colon, internal hemorrhoids.  Recommended repeat colonoscopy in 3 years.   DILATION AND CURETTAGE OF UTERUS     ESOPHAGOGASTRODUODENOSCOPY N/A 05/20/2017   Surgeon: Danie Binder, MD; normal esophagus and  mild gastritis due to aspirin.  Biopsies negative for H. pylori.   left breast      cancer, in 2006   TUBAL LIGATION       OB History     Gravida  8   Para  5   Term  5   Preterm  0   AB  3   Living         SAB  3   IAB  0   Ectopic  0   Multiple      Live Births              Family History  Problem Relation Age of Onset   Breast cancer Mother    COPD Father    Emphysema Father    Throat cancer Sister    Glaucoma Brother    Schizophrenia Brother    Heart attack Brother    Lung cancer Sister        former smoker   Hepatitis C Daughter    Seizures Daughter    SIDS Son    Colon cancer Neg Hx    Colon polyps Neg Hx     Social History   Tobacco Use   Smoking status: Every Day    Packs/day: 0.50    Years: 50.00    Pack years: 25.00    Types: Cigarettes   Smokeless tobacco: Never   Tobacco comments:    smokes 1/2 pack per day 11/19/2019  Vaping Use   Vaping Use: Never used  Substance Use Topics   Alcohol use: No    Alcohol/week: 0.0 standard drinks   Drug use: No    Home Medications Prior to Admission medications   Medication Sig Start Date End Date Taking? Authorizing Provider  acetaminophen (TYLENOL) 325 MG tablet Take 650 mg by mouth every 6 (six) hours as needed for moderate pain.     [provider]  albuterol (PROAIR HFA) 108 (90 Base) MCG/ACT inhaler 2 puffs every 4 hours as needed only  if your can't catch your breath Patient taking differently: Inhale 2 puffs into the lungs every 4 (four) hours as needed for wheezing or shortness of breath. 05/31/20   Lindell Spar, MD  albuterol (PROVENTIL) (2.5 MG/3ML) 0.083% nebulizer solution INHALE ONE VIAL VIA NEBULIZER THREE TIMES DAILY. Patient taking differently: Take 2.5 mg by nebulization 3 (three) times daily as needed for wheezing or shortness of breath. 07/08/18   Perlie Mayo, NP  alendronate (FOSAMAX) 70 MG tablet Take 1 tablet (70 mg total) by mouth every 7 (seven) days.  Take with a full glass of water on an empty stomach. Patient taking differently: Take 70 mg by mouth every Sunday. Take with a full glass of water on an empty stomach. 04/19/20   Lindell Spar, MD  aspirin EC 81 MG tablet Take 81 mg by mouth daily.    [provider]  ATROVENT HFA 17 MCG/ACT inhaler INHALE 2 PUFFS BY MOUTH FOUR TIMES A DAY. Patient taking differently: Inhale 2 puffs into the lungs 4 (four) times daily. 06/17/20   Lindell Spar, MD  Calcium Carbonate-Vitamin D (CALCIUM 600 + D PO) Take 1  tablet by mouth 2 (two) times daily.    [provider]  cyclobenzaprine (FLEXERIL) 5 MG tablet Take 1 tablet (5 mg total) by mouth at bedtime as needed. Patient taking differently: Take 5 mg by mouth at bedtime as needed for muscle spasms. 05/31/20   Sanjuana Kava, MD  diphenhydramine-acetaminophen (TYLENOL PM) 25-500 MG TABS tablet Take 1 tablet by mouth at bedtime as needed (pain).    [provider]  Emollient (VASELINE INTENSIVE CARE EX) Apply 1 application topically daily.    [provider]  famotidine (PEPCID) 20 MG tablet Take 20 mg by mouth at bedtime.  09/04/18   [provider]  fluticasone (FLONASE) 50 MCG/ACT nasal spray Place 2 sprays into both nostrils daily. 02/01/20   [name], MD  hydrochlorothiazide (HYDRODIURIL) 25 MG tablet TAKE 1 TABLET BY MOUTH ONCE A DAY. Patient taking differently: Take 25 mg by mouth daily. 03/21/20   Lindell Spar, MD  irbesartan (AVAPRO) 300 MG tablet Take 1 tablet (300 mg total) by mouth daily. 11/19/19   Tanda Rockers, MD  loratadine (CLARITIN) 10 MG tablet TAKE 1 TABLET BY MOUTH ONCE DAILY FOR ALLERGIES. Patient taking differently: Take 10 mg by mouth daily. 10/22/19   Perlie Mayo, NP  montelukast (SINGULAIR) 10 MG tablet TAKE 1 TABLET BY MOUTH AT BEDTIME. Patient taking differently: Take 10 mg by mouth at bedtime. 06/17/20   [name], MD  Naphazoline HCl (CLEAR EYES OP) Place 1 drop  into both eyes daily as needed (irritation).    [provider]  nitrofurantoin, macrocrystal-monohydrate, (MACROBID) 100 MG capsule Take 1 capsule (100 mg total) by mouth 2 (two) times daily. 07/08/20   Lindell Spar, MD  omeprazole (PRILOSEC) 20 MG capsule Take 20 mg by mouth daily as needed (acid reflux).    [provider]  polyethylene glycol powder (GLYCOLAX/MIRALAX) powder MIX 1 CAPFUL IN 8 OUNCES OF JUICE OR WATER AND DRINK ONCE DAILY. Patient taking differently: Take 17 g by mouth daily. 04/23/16   [name], MD  potassium chloride SA (KLOR-CON) 20 MEQ tablet TAKE 1 TABLET BY MOUTH DAILY. Patient taking differently: Take 20 mEq by mouth daily. 05/13/20   [name], MD  SYMBICORT 160-4.5 MCG/ACT inhaler INHALE 2 PUFFS INTO THE LUNGS TWICE DAILY. Patient taking differently: Inhale 2 puffs into the lungs 2 (two) times daily. 06/07/20   Lindell Spar, MD  traMADol (ULTRAM) 50 MG tablet One tablet every six hours as needed for pain. Patient taking differently: Take 50 mg by mouth every 6 (six) hours as needed for moderate pain. 05/31/20   Sanjuana Kava, MD  UNABLE TO [place] to evaluate and tx as indicated for SN. Dx: G30.9 07/08/20   Lindell Spar, MD    Allergies    Penicillins and Sulfonamide derivatives  Review of Systems   Review of Systems  Constitutional:  Positive for appetite change, chills and fatigue. Negative for fever.  HENT:  Negative for congestion.   Eyes:  Negative for visual disturbance.  Respiratory:  Negative for shortness of breath.   Cardiovascular:  Negative for chest pain.  Gastrointestinal:  Positive for abdominal distention, abdominal pain and diarrhea. Negative for blood in stool, nausea and vomiting.  Genitourinary:  Positive for dysuria. Negative for difficulty urinating and hematuria.  Skin:  Negative for rash.  Neurological:  Negative for headaches.   Physical Exam Updated Vital Signs BP (!) 166/87    Pulse 91  Temp 99 F (37.2 C) (Oral)   Resp 18   Ht _0  (1.651 m)   Wt 77.1 kg   SpO2 98%   BMI 28.29 kg/m   Physical Exam Vitals and nursing note reviewed.  Constitutional:      Appearance: Normal appearance. She is not toxic-appearing.  HENT:     Head: Normocephalic.     Mouth/Throat:     Mouth: Mucous membranes are moist.  Cardiovascular:     Rate and Rhythm: Normal rate.  Pulmonary:     Effort: Pulmonary effort is normal. No respiratory distress.  Abdominal:     Palpations: Abdomen is soft.     Tenderness: There is generalized abdominal tenderness. There is no guarding.     Hernia: No hernia is present.  Skin:    General: Skin is warm.  Neurological:     Mental Status: She is alert and oriented to person, place, and time. Mental status is at baseline.  Psychiatric:        Mood and Affect: Mood normal.    ED Results / Procedures / Treatments   Labs (all labs ordered are listed, but only abnormal results are displayed) Labs Reviewed  CBC WITH DIFFERENTIAL/PLATELET - Abnormal; Notable for the following components:      Result Value   HCT 47.5 (*)    Platelets 454 (*)    Monocytes Absolute 1.3 (*)    All other components within normal limits  URINALYSIS, ROUTINE W REFLEX MICROSCOPIC - Abnormal; Notable for the following components:   Protein, ur 100 (*)    Bacteria, UA RARE (*)    All other components within normal limits  COMPREHENSIVE METABOLIC PANEL  LIPASE, BLOOD    EKG None  Radiology No results found.  Procedures Procedures   Medications Ordered in ED Medications - No data to display  ED Course  I have reviewed the triage vital signs and the nursing notes.  Pertinent labs & imaging results that were available during my care of the patient were reviewed by me and considered in my medical decision making (see chart for details).    MDM Rules/Calculators/A&P                          75 year old female presents emergency department  generalized abdominal pain and intermittent episodes of diarrhea.  She is currently on day 3 of antibiotics for UTI that was diagnosed 2 days ago.  Denies any fever.  Blood work is reassuring, CT abdomen pelvis shows no acute process.  Her abdominal discomfort/diarrhea is most likely secondary to antibiotic use.  I have instructed her to continue on her regimen for full treatment of UTI.  Given strict return to ED instructions.  Otherwise her abdomen is benign, she is able to p.o.  Vitals are stable.  Patient will be discharged and treated as an outpatient.  Discharge plan and strict return to ED precautions discussed, patient verbalizes understanding and agreement.  Final Clinical Impression(s) / ED Diagnoses Final diagnoses:  None    Rx / DC Orders ED Discharge Orders     None        Lorelle Gibbs, DO 07/10/20 1503

## 2020-07-10 NOTE — ED Notes (Signed)
Pt ambulatory w/ contact guard assist to bathroom

## 2020-07-11 ENCOUNTER — Ambulatory Visit (HOSPITAL_COMMUNITY): Payer: Medicare Other | Admitting: Hematology

## 2020-07-11 LAB — URINE CULTURE: Organism ID, Bacteria: NO GROWTH

## 2020-07-12 ENCOUNTER — Encounter (HOSPITAL_COMMUNITY): Payer: Self-pay

## 2020-07-12 ENCOUNTER — Ambulatory Visit (HOSPITAL_COMMUNITY)
Admission: RE | Admit: 2020-07-12 | Discharge: 2020-07-12 | Disposition: A | Discharge status: Home / Self Care | Payer: 59 | Attending: Internal Medicine | Admitting: Internal Medicine

## 2020-07-12 ENCOUNTER — Other Ambulatory Visit: Payer: Self-pay

## 2020-07-12 ENCOUNTER — Encounter (HOSPITAL_COMMUNITY)
Admission: RE | Disposition: A | Discharge status: Home / Self Care | Payer: Self-pay | Source: Home / Self Care | Attending: Internal Medicine

## 2020-07-12 ENCOUNTER — Ambulatory Visit (HOSPITAL_COMMUNITY): Payer: 59 | Admitting: Anesthesiology

## 2020-07-12 DIAGNOSIS — K648 Other hemorrhoids: Secondary | ICD-10-CM | POA: Diagnosis not present

## 2020-07-12 DIAGNOSIS — Z820 Family history of epilepsy and other diseases of the nervous system: Secondary | ICD-10-CM | POA: Diagnosis not present

## 2020-07-12 DIAGNOSIS — Z7982 Long term (current) use of aspirin: Secondary | ICD-10-CM | POA: Diagnosis not present

## 2020-07-12 DIAGNOSIS — Z7951 Long term (current) use of inhaled steroids: Secondary | ICD-10-CM | POA: Insufficient documentation

## 2020-07-12 DIAGNOSIS — Z8249 Family history of ischemic heart disease and other diseases of the circulatory system: Secondary | ICD-10-CM | POA: Diagnosis not present

## 2020-07-12 DIAGNOSIS — K5730 Diverticulosis of large intestine without perforation or abscess without bleeding: Secondary | ICD-10-CM | POA: Diagnosis not present

## 2020-07-12 DIAGNOSIS — Z79899 Other long term (current) drug therapy: Secondary | ICD-10-CM | POA: Insufficient documentation

## 2020-07-12 DIAGNOSIS — F17210 Nicotine dependence, cigarettes, uncomplicated: Secondary | ICD-10-CM | POA: Diagnosis not present

## 2020-07-12 DIAGNOSIS — Z8379 Family history of other diseases of the digestive system: Secondary | ICD-10-CM | POA: Insufficient documentation

## 2020-07-12 DIAGNOSIS — D123 Benign neoplasm of transverse colon: Secondary | ICD-10-CM | POA: Insufficient documentation

## 2020-07-12 DIAGNOSIS — Z825 Family history of asthma and other chronic lower respiratory diseases: Secondary | ICD-10-CM | POA: Insufficient documentation

## 2020-07-12 DIAGNOSIS — Z801 Family history of malignant neoplasm of trachea, bronchus and lung: Secondary | ICD-10-CM | POA: Diagnosis not present

## 2020-07-12 DIAGNOSIS — D125 Benign neoplasm of sigmoid colon: Secondary | ICD-10-CM | POA: Diagnosis not present

## 2020-07-12 DIAGNOSIS — Z1211 Encounter for screening for malignant neoplasm of colon: Secondary | ICD-10-CM | POA: Insufficient documentation

## 2020-07-12 DIAGNOSIS — Z803 Family history of malignant neoplasm of breast: Secondary | ICD-10-CM | POA: Insufficient documentation

## 2020-07-12 HISTORY — PX: POLYPECTOMY: SHX5525

## 2020-07-12 HISTORY — PX: COLONOSCOPY WITH PROPOFOL: SHX5780

## 2020-07-12 SURGERY — COLONOSCOPY WITH PROPOFOL
Anesthesia: General

## 2020-07-12 MED ORDER — STERILE WATER FOR IRRIGATION IR SOLN
Status: DC | PRN
  Administered 2020-07-12: 100 mL

## 2020-07-12 MED ORDER — PROPOFOL 10 MG/ML IV BOLUS
INTRAVENOUS | Status: DC | PRN
  Administered 2020-07-12 (×2): 80 mg via INTRAVENOUS
  Administered 2020-07-12: 40 mg via INTRAVENOUS
  Administered 2020-07-12: 50 mg via INTRAVENOUS

## 2020-07-12 MED ORDER — LACTATED RINGERS IV SOLN: INTRAVENOUS | Status: DC

## 2020-07-12 MED ORDER — LIDOCAINE HCL (CARDIAC) PF 100 MG/5ML IV SOSY
PREFILLED_SYRINGE | INTRAVENOUS | Status: DC | PRN
  Administered 2020-07-12: 50 mg via INTRAVENOUS

## 2020-07-12 NOTE — Anesthesia Postprocedure Evaluation (Signed)
Anesthesia Post Note  Patient: [name]  Procedure(s) Performed: COLONOSCOPY WITH PROPOFOL POLYPECTOMY  Patient location during evaluation: Phase II Anesthesia Type: General Level of consciousness: awake and alert and oriented Pain management: pain level controlled Vital Signs Assessment: post-procedure vital signs reviewed and stable Respiratory status: spontaneous breathing and respiratory function stable Cardiovascular status: blood pressure returned to baseline and stable Postop Assessment: no apparent nausea or vomiting Anesthetic complications: no   No notable events documented.   Last Vitals:  Vitals:   07/12/20 1052 07/12/20 1316  BP: (!) 156/82 (!) 157/67  Pulse: 100 84  Resp: 15 18  Temp: 37.1 C 36.6 C  SpO2: 95% 99%    Last Pain:  Vitals:   07/12/20 1316  TempSrc: Oral  PainSc: 8                  [name] C Detra Bores

## 2020-07-12 NOTE — Anesthesia Procedure Notes (Signed)
Date/Time: 07/12/2020 12:51 PM Performed by: Orlie Dakin, CRNA Pre-anesthesia Checklist: Patient identified, Emergency Drugs available, Suction available and Patient being monitored Oxygen Delivery Method: Nasal cannula Induction Type: IV induction Placement Confirmation: positive ETCO2

## 2020-07-12 NOTE — Discharge Instructions (Signed)
  Colonoscopy Discharge Instructions  Read the instructions outlined below and refer to this sheet in the next few weeks. These discharge instructions provide you with general information on caring for yourself after you leave the hospital. Your doctor may also give you specific instructions. While your treatment has been planned according to the most current medical practices available, unavoidable complications occasionally occur.   ACTIVITY You may resume your regular activity, but move at a slower pace for the next 24 hours.  Take frequent rest periods for the next 24 hours.  Walking will help get rid of the air and reduce the bloated feeling in your belly (abdomen).  No driving for 24 hours (because of the medicine (anesthesia) used during the test).   Do not sign any important legal documents or operate any machinery for 24 hours (because of the anesthesia used during the test).  NUTRITION Drink plenty of fluids.  You may resume your normal diet as instructed by your doctor.  Begin with a light meal and progress to your normal diet. Heavy or fried foods are harder to digest and may make you feel sick to your stomach (nauseated).  Avoid alcoholic beverages for 24 hours or as instructed.  MEDICATIONS You may resume your normal medications unless your doctor tells you otherwise.  WHAT YOU CAN EXPECT TODAY Some feelings of bloating in the abdomen.  Passage of more gas than usual.  Spotting of blood in your stool or on the toilet paper.  IF YOU HAD POLYPS REMOVED DURING THE COLONOSCOPY: No aspirin products for 7 days or as instructed.  No alcohol for 7 days or as instructed.  Eat a soft diet for the next 24 hours.  FINDING OUT THE RESULTS OF YOUR TEST Not all test results are available during your visit. If your test results are not back during the visit, make an appointment with your caregiver to find out the results. Do not assume everything is normal if you have not heard from your  caregiver or the medical facility. It is important for you to follow up on all of your test results.  SEEK IMMEDIATE MEDICAL ATTENTION IF: You have more than a spotting of blood in your stool.  Your belly is swollen (abdominal distention).  You are nauseated or vomiting.  You have a temperature over 101.  You have abdominal pain or discomfort that is severe or gets worse throughout the day.   Your colonoscopy revealed 2 polyp(s) which I removed successfully. Await pathology results, my office will contact you. I recommend repeating colonoscopy in 5 years for surveillance purposes.   You also have diverticulosis and internal hemorrhoids. I would recommend increasing fiber in your diet or adding OTC Benefiber/Metamucil. Be sure to drink at least 4 to 6 glasses of water daily. Follow-up with GI as needed.   I hope you have a great rest of your week!  Sundus Pete K. Payam Gribble, D.O. Gastroenterology and Hepatology [facility]  

## 2020-07-12 NOTE — Op Note (Addendum)
[facility] Patient Name: [name] Procedure Date: 07/12/2020 12:33 PM MRN: [id number] Date of Birth: 11-17-1945 Attending MD: Elon Alas. Edgar Frisk CSN: [id number] Age: 75 Admit Type: Outpatient Procedure:                Colonoscopy Indications:              High risk colon cancer surveillance: Personal                            history of colonic polyps Providers:                Elon Alas. Abbey Chatters, DO, Jessica Boudreaux, Randa Spike, Technician Referring MD:              Medicines:                See the Anesthesia note for documentation of the                            administered medications Complications:            No immediate complications. Estimated Blood Loss:     Estimated blood loss was minimal. Procedure:                Pre-Anesthesia Assessment:                           - The anesthesia plan was to use monitored                            anesthesia care (MAC).                           After obtaining informed consent, the colonoscope                            was passed under direct vision. Throughout the                            procedure, the patient's blood pressure, pulse, and                            oxygen saturations were monitored continuously. The                            [id number]-HQ190L ([id number]) scope was introduced through                            the anus and advanced to the the cecum, identified                            by appendiceal orifice and ileocecal valve. The                            colonoscopy was performed without difficulty. The  patient tolerated the procedure well. The quality                            of the bowel preparation was evaluated using the                            BBPS [facility] Bowel Preparation Scale) with scores                            of: Right Colon = 3, Transverse Colon = 3 and Left                            Colon = 3 (entire mucosa seen well  with no residual                            staining, small fragments of stool or opaque                            liquid). The total BBPS score equals 9. Scope In: 12:57:09 PM Scope Out: 1:11:54 PM Scope Withdrawal Time: 0 hours 10 minutes 39 seconds  Total Procedure Duration: 0 hours 14 minutes 45 seconds  Findings:      The perianal and digital rectal examinations were normal.      Non-bleeding internal hemorrhoids were found during endoscopy.      Multiple small-mouthed diverticula were found in the sigmoid colon.      A 8 mm polyp was found in the transverse colon. The polyp was sessile.       The polyp was removed with a cold snare. Resection and retrieval were       complete.      Evidence of prior colon resection in sigmoid colon with exposed suture.       At the base of the suture there appeared to be a 70m polyp. I proceeded       to remove polyp with biopsy forceps around the suture itself with       minimal bleeding. Impression:               - Non-bleeding internal hemorrhoids.                           - Diverticulosis in the sigmoid colon.                           - One 8 mm polyp in the transverse colon, removed                            with a cold snare. Resected and retrieved. Moderate Sedation:      Per Anesthesia Care Recommendation:           - Patient has a contact number available for                            emergencies. The signs and symptoms of potential  delayed complications were discussed with the                            patient. Return to normal activities tomorrow.                            Written discharge instructions were provided to the                            patient.                           - Resume previous diet.                           - Continue present medications.                           - Await pathology results.                           - Repeat colonoscopy date to be determined after                             pending pathology results are reviewed for                            surveillance based on pathology results.                           - Return to GI clinic PRN. Procedure Code(s):        --- Professional ---                           [phone], Colonoscopy, flexible; with removal of                            tumor(s), polyp(s), or other lesion(s) by snare                            technique Diagnosis Code(s):        --- Professional ---                           Z86.010, Personal history of colonic polyps                           K64.8, Other hemorrhoids                           K63.5, Polyp of colon                           K57.30, Diverticulosis of large intestine without                            perforation or abscess without bleeding CPT copyright 2019 American  Medical Association. All rights reserved. The codes documented in this report are preliminary and upon coder review may  be revised to meet current compliance requirements. Elon Alas. Abbey Chatters, DO [place] Abbey Chatters, DO 07/12/2020 1:23:11 PM This report has been signed electronically. Number of Addenda: 0

## 2020-07-12 NOTE — H&P (Signed)
Primary Care Physician:  [name], MD Primary Gastroenterologist:  Dr. Abbey Chatters  Pre-Procedure History & Physical: HPI:  [name] is a 75 y.o. female is here for a colonoscopy for surveillance due to personal history of adenomatous colon polyps. Last colonoscopy in May 2019 with five 3-6 mm polyps (4 tubular adenomas, 1 hyperplastic polyp), tortuous left colon, internal hemorrhoids.  Recommended repeat colonoscopy in 3 years  Past Medical History:  Diagnosis Date   Breast cancer ([place]) 2007   Stage I (T1b N0 M0), grade 1 well-differentiated carcinoma of the left breast status, post lumpectomy followed by radiation therapy. Her estrogen receptor receptors were 93%, progesterone receptors 67%. HER-2/neu was negative. No lymphovascular space invasion was seen. All margins were clear. Ki-67 marker was low at 1% with surgery on 11/15/2004. Treated then with post-lumpectomy radiation, finish   Breast cancer, left breast ([place]) 2007   COPD (chronic obstructive pulmonary disease) ([place])    Diabetes mellitus without complication ([place])    diet controlled   Diverticula of colon    Hx of rickettsial disease    Hyperlipidemia    Hypertension    Kidney stones    Nicotine dependence    Osteoarthritis     Past Surgical History:  Procedure Laterality Date   ABDOMINAL HYSTERECTOMY     BREAST SURGERY  2005 approx   left lumpectomy    CATARACT EXTRACTION W/PHACO Left 05/01/2019   Procedure: CATARACT EXTRACTION PHACO AND INTRAOCULAR LENS PLACEMENT LEFT EYE CDE=11.73;  Surgeon: Baruch Goldmann, MD;  Location: [facility];  Service: Ophthalmology;  Laterality: Left;  left   CATARACT EXTRACTION W/PHACO Right 05/15/2019   Procedure: CATARACT EXTRACTION PHACO AND INTRAOCULAR LENS PLACEMENT RIGHT EYE;  Surgeon: Baruch Goldmann, MD;  Location: [facility];  Service: Ophthalmology;  Laterality: Right;  CDE: 16.07   CHOLECYSTECTOMY N/A 05/26/2014   Procedure: LAPAROSCOPIC CHOLECYSTECTOMY;  Surgeon: Aviva Signs Md,  MD;  Location: [facility];  Service: General;  Laterality: N/A;   COLECTOMY     2005, diverticulitis   COLONOSCOPY N/A 05/20/2017    Surgeon: Danie Binder, MD;  five 3-6 mm polyps (4 tubular adenomas, 1 hyperplastic polyp), tortuous left colon, internal hemorrhoids.  Recommended repeat colonoscopy in 3 years.   DILATION AND CURETTAGE OF UTERUS     ESOPHAGOGASTRODUODENOSCOPY N/A 05/20/2017   Surgeon: Danie Binder, MD; normal esophagus and mild gastritis due to aspirin.  Biopsies negative for H. pylori.   left breast      cancer, in 2006   TUBAL LIGATION      Prior to Admission medications   Medication Sig Start Date End Date Taking? Authorizing Provider  acetaminophen (TYLENOL) 325 MG tablet Take 650 mg by mouth every 6 (six) hours as needed for moderate pain.    Yes [provider]  albuterol (PROAIR HFA) 108 (90 Base) MCG/ACT inhaler 2 puffs every 4 hours as needed only  if your can't catch your breath Patient taking differently: Inhale 2 puffs into the lungs every 4 (four) hours as needed for wheezing or shortness of breath. 05/31/20  Yes Lindell Spar, MD  albuterol (PROVENTIL) (2.5 MG/3ML) 0.083% nebulizer solution INHALE ONE VIAL VIA NEBULIZER THREE TIMES DAILY. Patient taking differently: Take 2.5 mg by nebulization 3 (three) times daily as needed for wheezing or shortness of breath. 07/08/18  Yes Perlie Mayo, NP  alendronate (FOSAMAX) 70 MG tablet Take 1 tablet (70 mg total) by mouth every 7 (seven) days. Take with a full glass of water  on an empty stomach. Patient taking differently: Take 70 mg by mouth every Sunday. Take with a full glass of water on an empty stomach. 04/19/20  Yes Lindell Spar, MD  aspirin EC 81 MG tablet Take 81 mg by mouth daily.   Yes [provider]  ATROVENT HFA 17 MCG/ACT inhaler INHALE 2 PUFFS BY MOUTH FOUR TIMES A DAY. Patient taking differently: Inhale 2 puffs into the lungs 4 (four) times daily. 06/17/20  Yes Lindell Spar, MD   Calcium Carbonate-Vitamin D (CALCIUM 600 + D PO) Take 1 tablet by mouth 2 (two) times daily.   Yes [provider]  cyclobenzaprine (FLEXERIL) 5 MG tablet Take 1 tablet (5 mg total) by mouth at bedtime as needed. Patient taking differently: Take 5 mg by mouth at bedtime as needed for muscle spasms. 05/31/20  Yes Sanjuana Kava, MD  diphenhydramine-acetaminophen (TYLENOL PM) 25-500 MG TABS tablet Take 1 tablet by mouth at bedtime as needed (pain).   Yes [provider]  Emollient (VASELINE INTENSIVE CARE EX) Apply 1 application topically daily.   Yes [provider]  famotidine (PEPCID) 20 MG tablet Take 20 mg by mouth at bedtime.  09/04/18  Yes [provider]  fluticasone (FLONASE) 50 MCG/ACT nasal spray Place 2 sprays into both nostrils daily. 02/01/20  Yes [name], MD  hydrochlorothiazide (HYDRODIURIL) 25 MG tablet TAKE 1 TABLET BY MOUTH ONCE A DAY. Patient taking differently: Take 25 mg by mouth daily. 03/21/20  Yes Lindell Spar, MD  irbesartan (AVAPRO) 300 MG tablet Take 1 tablet (300 mg total) by mouth daily. 11/19/19  Yes Tanda Rockers, MD  loratadine (CLARITIN) 10 MG tablet TAKE 1 TABLET BY MOUTH ONCE DAILY FOR ALLERGIES. Patient taking differently: Take 10 mg by mouth daily. 10/22/19  Yes Perlie Mayo, NP  montelukast (SINGULAIR) 10 MG tablet TAKE 1 TABLET BY MOUTH AT BEDTIME. Patient taking differently: Take 10 mg by mouth at bedtime. 06/17/20  Yes [name], MD  Naphazoline HCl (CLEAR EYES OP) Place 1 drop into both eyes daily as needed (irritation).   Yes [provider]  nitrofurantoin, macrocrystal-monohydrate, (MACROBID) 100 MG capsule Take 1 capsule (100 mg total) by mouth 2 (two) times daily. 07/08/20  Yes Lindell Spar, MD  omeprazole (PRILOSEC) 20 MG capsule Take 20 mg by mouth daily as needed (acid reflux).   Yes [provider]  polyethylene glycol powder (GLYCOLAX/MIRALAX) powder MIX 1 CAPFUL IN 8  OUNCES OF JUICE OR WATER AND DRINK ONCE DAILY. Patient taking differently: Take 17 g by mouth daily. 04/23/16  Yes [name], MD  potassium chloride SA (KLOR-CON) 20 MEQ tablet TAKE 1 TABLET BY MOUTH DAILY. Patient taking differently: Take 20 mEq by mouth daily. 05/13/20  Yes [name], MD  SYMBICORT 160-4.5 MCG/ACT inhaler INHALE 2 PUFFS INTO THE LUNGS TWICE DAILY. Patient taking differently: Inhale 2 puffs into the lungs 2 (two) times daily. 06/07/20  Yes Lindell Spar, MD  traMADol (ULTRAM) 50 MG tablet One tablet every six hours as needed for pain. Patient taking differently: Take 50 mg by mouth every 6 (six) hours as needed for moderate pain. 05/31/20  Yes Sanjuana Kava, MD  UNABLE TO [place] to evaluate and tx as indicated for SN. Dx: G30.9 07/08/20   Lindell Spar, MD    Allergies as of 05/23/2020 - Review Complete 05/23/2020  Allergen Reaction Noted   Penicillins Hives, Shortness Of Breath, and Swelling 09/29/2008  Sulfonamide derivatives Other (See Comments) 09/29/2008    Family History  Problem Relation Age of Onset   Breast cancer Mother    COPD Father    Emphysema Father    Throat cancer Sister    Glaucoma Brother    Schizophrenia Brother    Heart attack Brother    Lung cancer Sister        former smoker   Hepatitis C Daughter    Seizures Daughter    SIDS Son    Colon cancer Neg Hx    Colon polyps Neg Hx     Social History   Socioeconomic History   Marital status: Married    Spouse name: Not on file   Number of children: 5   Years of education: 9th grade    Highest education level: 9th grade  Occupational History   Occupation: retired    Fish farm manager: RETIRED  Tobacco Use   Smoking status: Every Day    Packs/day: 0.50    Years: 50.00    Pack years: 25.00    Types: Cigarettes   Smokeless tobacco: Never   Tobacco comments:    smokes 1/2 pack per day 11/19/2019  Vaping Use   Vaping Use: Never used  Substance and Sexual Activity    Alcohol use: No    Alcohol/week: 0.0 standard drinks   Drug use: No   Sexual activity: Not Currently  Other Topics Concern   Not on file  Social History Narrative   ** Merged History Encounter **       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  MARRIED WITH A RUN AWAY HUSBAND(FUGITIVE 10 YRS). 3 MISCARRIAGES, 5 CHILDREN   Social Determinants of Health   Financial Resource Strain: Low Risk    Difficulty of Paying Living Expenses: Not hard at all  Food Insecurity: No Food Insecurity   Worried About Charity fundraiser in the Last Year: Never true   Ran Out of Food in the Last Year: Never true  Transportation Needs: No Transportation Needs   Lack of Transportation (Medical): No   Lack of Transportation (Non-Medical): No  Physical Activity: Sufficiently Active   Days of Exercise per Week: 7 days   Minutes of Exercise per Session: 60 min  Stress: No Stress Concern Present   Feeling of Stress : Not at all  Social Connections: Moderately Isolated   Frequency of Communication with Friends and Family: More than three times a week   Frequency of Social Gatherings with Friends and Family: Once a week   Attends Religious Services: Never   Marine scientist or Organizations: No   Attends Music therapist: Never   Marital Status: Married  Human resources officer Violence: Not At Risk   Fear of Current or Ex-Partner: No   Emotionally Abused: No   Physically Abused: No   Sexually Abused: No    Review of Systems: See HPI, otherwise negative ROS  Physical Exam: Vital signs in last 24 hours: Temp:  [98.8 F (37.1 C)] 98.8 F (37.1 C) (06/28 1052) Pulse Rate:  [100] 100 (06/28 1052) Resp:  [15] 15 (06/28 1052) BP: (156)/(82) 156/82 (06/28 1052) SpO2:  [95 %] 95 % (06/28 1052) Weight:  [77.1 kg] 77.1 kg (06/28 1038)   General:   Alert,  Well-developed, well-nourished, pleasant and cooperative in  NAD Head:  Normocephalic and atraumatic. Eyes:  Sclera clear, no icterus.   Conjunctiva pink. Ears:  Normal auditory acuity.  Nose:  No deformity, discharge,  or lesions. Mouth:  No deformity or lesions, dentition normal. Neck:  Supple; no masses or thyromegaly. Lungs:  Clear throughout to auscultation.   No wheezes, crackles, or rhonchi. No acute distress. Heart:  Regular rate and rhythm; no murmurs, clicks, rubs,  or gallops. Abdomen:  Soft, nontender and nondistended. No masses, hepatosplenomegaly or hernias noted. Normal bowel sounds, without guarding, and without rebound.   Msk:  Symmetrical without gross deformities. Normal posture. Extremities:  Without clubbing or edema. Neurologic:  Alert and  oriented x4;  grossly normal neurologically. Skin:  Intact without significant lesions or rashes. Cervical Nodes:  No significant cervical adenopathy. Psych:  Alert and cooperative. Normal mood and affect.  Impression/Plan: [name] is here for a colonoscopy for surveillance due to personal history of adenomatous colon polyps.  The risks of the procedure including infection, bleed, or perforation as well as benefits, limitations, alternatives and imponderables have been reviewed with the patient. Questions have been answered. All parties agreeable.

## 2020-07-12 NOTE — Anesthesia Preprocedure Evaluation (Signed)
Anesthesia Evaluation  Patient identified by MRN, date of birth, ID band Patient awake    Reviewed: Allergy & Precautions, NPO status , Patient's Chart, lab work & pertinent test results  Airway Mallampati: III  TM Distance: >3 FB Neck ROM: Full    Dental  (+) Edentulous Upper, Partial Lower   Pulmonary shortness of breath and with exertion, asthma , COPD,  COPD inhaler, Current Smoker and Patient abstained from smoking.,    Pulmonary exam normal breath sounds clear to auscultation       Cardiovascular Exercise Tolerance: Good hypertension, Pt. on medications + CAD  Normal cardiovascular exam Rhythm:Regular Rate:Normal  08-Jul-2020 10:40:42 [facility]-300 ROUTINE RECORD [id number] (10 yr) Female Caucasian Room: Loc:903 Technician: Test ind: Vent. rate 95 BPM PR interval 178 ms QRS duration 84 ms QT/QTcB 354/444 ms P-R-T axes 74 27 67 Normal sinus rhythm Cannot rule out Anterior infarct , age undetermined Abnormal ECG   Neuro/Psych PSYCHIATRIC DISORDERS Dementia Tremors   Neuromuscular disease (tremors )    GI/Hepatic negative GI ROS, Neg liver ROS,   Endo/Other  diabetes (diet controlled), Well Controlled, Type 2  Renal/GU Renal disease (stones)     Musculoskeletal  (+) Arthritis , Osteoarthritis,    Abdominal   Peds  Hematology   Anesthesia Other Findings Left breast cancer  Reproductive/Obstetrics                             Anesthesia Physical  Anesthesia Plan  ASA: 3  Anesthesia Plan: General   Post-op Pain Management:    Induction: Intravenous  PONV Risk Score and Plan: Propofol infusion  Airway Management Planned: Nasal Cannula and Natural Airway  Additional Equipment:   Intra-op Plan:   Post-operative Plan:   Informed Consent: I have reviewed the patients History and Physical, chart, labs and discussed the procedure including the risks,  benefits and alternatives for the proposed anesthesia with the patient or authorized representative who has indicated his/her understanding and acceptance.     Dental advisory given  Plan Discussed with: CRNA and Surgeon  Anesthesia Plan Comments:         Anesthesia Quick Evaluation

## 2020-07-12 NOTE — Transfer of Care (Signed)
Immediate Anesthesia Transfer of Care Note  Patient: [name]  Procedure(s) Performed: COLONOSCOPY WITH PROPOFOL POLYPECTOMY  Patient Location: Short Stay  Anesthesia Type:General  Level of Consciousness: awake, alert  and oriented  Airway & Oxygen Therapy: Patient Spontanous Breathing  Post-op Assessment: Report given to RN and Post -op Vital signs reviewed and stable  Post vital signs: Reviewed and stable  Last Vitals:  Vitals Value Taken Time  BP    Temp    Pulse    Resp    SpO2      Last Pain:  Vitals:   07/12/20 1247  TempSrc:   PainSc: 8       Patients Stated Pain Goal: 10 (32/67/12 4580)  Complications: No notable events documented.

## 2020-07-13 ENCOUNTER — Telehealth: Payer: Self-pay

## 2020-07-13 NOTE — Telephone Encounter (Signed)
[name] called and stated that a face to face needs to be completed, and [facility] is Out of Network

## 2020-07-13 NOTE — Telephone Encounter (Signed)
I left a message for pt's granddaughter to please call back in regards to getting pt a face-to-face scheduled that specifies the need for the home health. Even though she was just seen via telephone, insurance requires actual face to face before [facility] can consider home health.

## 2020-07-14 LAB — SURGICAL PATHOLOGY

## 2020-07-15 ENCOUNTER — Other Ambulatory Visit: Payer: Self-pay | Admitting: Internal Medicine

## 2020-07-15 DIAGNOSIS — J4489 Other specified chronic obstructive pulmonary disease: Secondary | ICD-10-CM

## 2020-07-15 DIAGNOSIS — J449 Chronic obstructive pulmonary disease, unspecified: Secondary | ICD-10-CM

## 2020-07-15 DIAGNOSIS — R918 Other nonspecific abnormal finding of lung field: Secondary | ICD-10-CM

## 2020-07-19 ENCOUNTER — Encounter (HOSPITAL_COMMUNITY): Payer: Self-pay | Admitting: Internal Medicine

## 2020-07-20 ENCOUNTER — Ambulatory Visit (HOSPITAL_COMMUNITY): Payer: 59

## 2020-07-20 ENCOUNTER — Encounter (HOSPITAL_COMMUNITY): Payer: 59

## 2020-07-25 NOTE — Telephone Encounter (Signed)
I spoke with pt granddaughter and she will call to schedule pt an in office visit, CAP nurse is going out to do an evaluation as well this Wednesday 07/27/20.

## 2020-08-01 ENCOUNTER — Ambulatory Visit: Payer: 59 | Admitting: Family Medicine

## 2020-08-03 ENCOUNTER — Ambulatory Visit (HOSPITAL_COMMUNITY): Payer: 59 | Admitting: Hematology and Oncology

## 2020-08-05 ENCOUNTER — Telehealth (INDEPENDENT_AMBULATORY_CARE_PROVIDER_SITE_OTHER): Payer: 59 | Admitting: Family Medicine

## 2020-08-05 ENCOUNTER — Encounter: Payer: Self-pay | Admitting: Family Medicine

## 2020-08-05 ENCOUNTER — Ambulatory Visit: Payer: 59 | Admitting: Family Medicine

## 2020-08-05 ENCOUNTER — Other Ambulatory Visit: Payer: Self-pay

## 2020-08-05 VITALS — Temp 98.7°F

## 2020-08-05 DIAGNOSIS — J440 Chronic obstructive pulmonary disease with acute lower respiratory infection: Secondary | ICD-10-CM

## 2020-08-05 DIAGNOSIS — J0100 Acute maxillary sinusitis, unspecified: Secondary | ICD-10-CM | POA: Diagnosis not present

## 2020-08-05 DIAGNOSIS — J209 Acute bronchitis, unspecified: Secondary | ICD-10-CM | POA: Diagnosis not present

## 2020-08-05 MED ORDER — FLUCONAZOLE 150 MG PO TABS: 150.0000 mg | ORAL_TABLET | Freq: Once | ORAL | 0 refills | Status: AC

## 2020-08-05 MED ORDER — AZITHROMYCIN 250 MG PO TABS: ORAL_TABLET | ORAL | 0 refills | Status: AC

## 2020-08-05 MED ORDER — BENZONATATE 100 MG PO CAPS: 100.0000 mg | ORAL_CAPSULE | Freq: Two times a day (BID) | ORAL | 0 refills | Status: DC | PRN

## 2020-08-05 NOTE — Progress Notes (Signed)
T Virtual Visit via Telephone Note  I connected with [name] on 08/05/20 at  1:40 PM EDT by telephone and verified that I am speaking with the correct person using two identifiers.  Location: Patient: home Provider: office   I discussed the limitations, risks, security and privacy concerns of performing an evaluation and management service by telephone and the availability of in person appointments. I also discussed with the patient that there may be a patient responsible charge related to this service. The patient expressed understanding and agreed to proceed.   History of Present Illness:   left ear pain 3 days, head stuffed  up, yellow green nasal drainage, and cough productive of yellow sputum, has had chills Observations/Objective: Temp 98.7 F (37.1 C)  Good communication with no confusion and intact memory. Alert and oriented x 3 No signs of respiratory distress during speech   Assessment and Plan:  Acute sinusitis Antibiotic prescribed and fluconazole for as needed use after course  Acute bronchitis with COPD ([place]) Decongestant and antibiotic prescribed  Follow Up Instructions:    I discussed the assessment and treatment plan with the patient. The patient was provided an opportunity to ask questions and all were answered. The patient agreed with the plan and demonstrated an understanding of the instructions.   The patient was advised to call back or seek an in-person evaluation if the symptoms worsen or if the condition fails to improve as anticipated.  I provided 12 minutes of non-face-to-face time during this encounter.   [name], MD

## 2020-08-05 NOTE — Patient Instructions (Signed)
F/U as before, call if you need me sooner  You are treated for sinusitis and bronchitis, 3 medications have been sent to your pharmacy, take as directed  Thanks for choosing [facility], we consider it a privelige to serve you.

## 2020-08-07 ENCOUNTER — Encounter: Payer: Self-pay | Admitting: Family Medicine

## 2020-08-07 NOTE — Assessment & Plan Note (Signed)
Antibiotic prescribed and fluconazole for as needed use after course

## 2020-08-07 NOTE — Assessment & Plan Note (Signed)
Decongestant and antibiotic prescribed 

## 2020-08-15 ENCOUNTER — Telehealth: Payer: Self-pay | Admitting: Orthopaedic Surgery

## 2020-08-16 ENCOUNTER — Other Ambulatory Visit: Payer: Self-pay

## 2020-08-16 ENCOUNTER — Ambulatory Visit (HOSPITAL_COMMUNITY)
Admission: RE | Admit: 2020-08-16 | Discharge: 2020-08-16 | Disposition: A | Discharge status: Home / Self Care | Payer: 59 | Source: Ambulatory Visit | Attending: Hematology | Admitting: Hematology

## 2020-08-16 ENCOUNTER — Other Ambulatory Visit: Payer: 59

## 2020-08-16 DIAGNOSIS — R928 Other abnormal and inconclusive findings on diagnostic imaging of breast: Secondary | ICD-10-CM

## 2020-08-17 ENCOUNTER — Other Ambulatory Visit: Payer: Self-pay | Admitting: Internal Medicine

## 2020-08-17 ENCOUNTER — Other Ambulatory Visit: Payer: Self-pay | Admitting: Family Medicine

## 2020-08-17 DIAGNOSIS — J3089 Other allergic rhinitis: Secondary | ICD-10-CM

## 2020-08-17 DIAGNOSIS — R918 Other nonspecific abnormal finding of lung field: Secondary | ICD-10-CM

## 2020-08-17 DIAGNOSIS — J449 Chronic obstructive pulmonary disease, unspecified: Secondary | ICD-10-CM

## 2020-08-18 ENCOUNTER — Other Ambulatory Visit: Payer: Self-pay

## 2020-08-18 ENCOUNTER — Ambulatory Visit (INDEPENDENT_AMBULATORY_CARE_PROVIDER_SITE_OTHER): Payer: 59 | Admitting: Family Medicine

## 2020-08-18 ENCOUNTER — Encounter: Payer: Self-pay | Admitting: Family Medicine

## 2020-08-18 VITALS — BP 160/70 | HR 85 | Resp 16 | Ht 65.0 in | Wt 172.0 lb

## 2020-08-18 DIAGNOSIS — I10 Essential (primary) hypertension: Secondary | ICD-10-CM | POA: Diagnosis not present

## 2020-08-18 DIAGNOSIS — E785 Hyperlipidemia, unspecified: Secondary | ICD-10-CM | POA: Diagnosis not present

## 2020-08-18 DIAGNOSIS — F17210 Nicotine dependence, cigarettes, uncomplicated: Secondary | ICD-10-CM

## 2020-08-18 DIAGNOSIS — D539 Nutritional anemia, unspecified: Secondary | ICD-10-CM

## 2020-08-18 DIAGNOSIS — J449 Chronic obstructive pulmonary disease, unspecified: Secondary | ICD-10-CM | POA: Diagnosis not present

## 2020-08-18 DIAGNOSIS — E559 Vitamin D deficiency, unspecified: Secondary | ICD-10-CM

## 2020-08-18 DIAGNOSIS — R7301 Impaired fasting glucose: Secondary | ICD-10-CM

## 2020-08-18 DIAGNOSIS — E538 Deficiency of other specified B group vitamins: Secondary | ICD-10-CM

## 2020-08-18 MED ORDER — SPIRONOLACTONE 25 MG PO TABS: 25.0000 mg | ORAL_TABLET | Freq: Every day | ORAL | 3 refills | Status: DC

## 2020-08-18 MED ORDER — CYCLOBENZAPRINE HCL 5 MG PO TABS: 5.0000 mg | ORAL_TABLET | Freq: Every evening | ORAL | 0 refills | Status: DC | PRN

## 2020-08-18 MED ORDER — ROSUVASTATIN CALCIUM 10 MG PO TABS: 10.0000 mg | ORAL_TABLET | Freq: Every day | ORAL | 2 refills | Status: DC

## 2020-08-18 NOTE — Patient Instructions (Addendum)
Annual exam in office with MD 11/02 or after, call if you need me sooner  BP is high, need to take spironolactone 25 mg one daily, and continue avapro 300 mg one daily  Please work on continuing to reduce cigarettes, now smoking 10 /day, need to quit, aim for 5/day in Nov  New for cholesterol is crestor 10 mg one at night  New is B 12 500 mcg once daily  Fasting lipid, cmp and EGFr, HBa1C and B12 level  end October  Please,no more accidents, no fracture of  right leg or left toe  Think about what you will eat, plan ahead. Choose " clean, green, fresh or frozen" over canned, processed or packaged foods which are more sugary, salty and fatty. 70 to 75% of food eaten should be vegetables and fruit. Three meals at set times with snacks allowed between meals, but they must be fruit or vegetables. Aim to eat over a 12 hour period , example 7 am to 7 pm, and STOP after  your last meal of the day. Drink water,generally about 64 ounces per day, no other drink is as healthy. Fruit juice is best enjoyed in a healthy way, by EATING the fruit.   Thanks for choosing [facility], we consider it a privelige to serve you.

## 2020-08-18 NOTE — Telephone Encounter (Signed)
REFILL Request: cyclobenzaprine (FLEXERIL) 5 MG tablet 30 tablet    [employer]

## 2020-08-18 NOTE — Progress Notes (Addendum)
[name]     MRN: [id number]      DOB: 10/31/1945   HPI [name] is here for follow up and re-evaluation of chronic medical conditions, medication management and review of any available recent lab and radiology data.  Preventive health is updated, specifically  Cancer screening and Immunization.   Questions or concerns regarding consultations or procedures which the PT has had in the interim are  addressed. The PT denies any adverse reactions to current medications since the last visit.  Recent fall with injury to left toe and right leg Requests bath seat or shower chair because of her severe generalized arthritis  and shortness of breath with activity ROS Denies recent fever or chills. Denies chronic  chest congestion, productive cough and wheezing. Denies chest pains, palpitations and leg swelling Denies abdominal pain, nausea, vomiting,diarrhea or constipation.   Denies dysuria, frequency, hesitancy or incontinence. C/o chronic  joint pain, swelling and limitation in mobility. Denies headaches, seizures, numbness, or tingling. Denies depression, anxiety or insomnia. Denies skin break down or rash.   PE  BP (!) 160/70   Pulse 85   Resp 16   Ht '5\' 5"'$  (1.651 m)   Wt 172 lb (78 kg)   SpO2 93%   BMI 28.62 kg/m   Patient alert and oriented and in no cardiopulmonary distress.  HEENT: No facial asymmetry, EOMI,     Neck supple .  Chest: decreased air entry , scattered crackles and few wheezes CVS: S1, S2 no murmurs, no S3.Regular rate.  ABD: Soft non tender.   Ext: No edema  MS: decreased  ROM spine, shoulders, hips and knees.  Skin: Intact, no ulcerations or rash noted.  Psych: Good eye contact, normal affect. Memory intact not anxious or depressed appearing.  CNS: CN 2-12 intact, power,  normal throughout.no focal deficits noted.   Assessment & Plan  Cigarette smoker Asked:confirms currently smokes cigarettes 10/day Assess: Unwilling to set a quit date,  but is cutting back Advise: needs to QUIT to reduce risk of cancer, cardio and cerebrovascular disease Assist: counseled for 5 minutes and literature provided Arrange: follow up in 2 to 4 months   HTN (hypertension) uncontroled , additional med added DASH diet and commitment to daily physical activity for a minimum of 30 minutes discussed and encouraged, as a part of hypertension management. The importance of attaining a healthy weight is also discussed.  BP/Weight 08/18/2020 07/12/2020 07/10/2020 07/08/2020 05/23/2020 05/05/2020 [id number]  Systolic BP 0000000 [id number] 0000000 [id number]  Diastolic BP 70 67 81 80 74 75 80  Wt. (Lbs) 172 170 170 - 178.6 177.4 174  BMI 28.62 28.29 28.29 29.72 29.72 29.52 28.96       Hyperlipidemia LDL goal <100 Hyperlipidemia:Low fat diet discussed and encouraged.   Lipid Panel  Lab Results  Component Value Date   CHOL 246 (H) 06/02/2020   HDL 73 06/02/2020   LDLCALC 147 (H) 06/02/2020   TRIG 147 06/02/2020   CHOLHDL 3.4 06/02/2020   Uncontrolled, needs to lower fat intake and take meds prescribed    COPD (chronic obstructive pulmonary disease) with chronic bronchitis (HCC) Worsening as she continues to smoke, continue current meds and encouraged to continue to work on quitting  Low vitamin B12 level Chronic fatigue and low vit B level, commit to daily oral B12, 5000 mcg  GENERALIZED OSTEOARTHROSIS UNSPECIFIED SITE Generalized arthritis affecting spine , knees shoulders. Increased fall risk Difficulty with bathing and dressing Needs bath seat-  Bath shower chair with orwithout wheels for increased independence and safety

## 2020-08-22 ENCOUNTER — Encounter: Payer: Self-pay | Admitting: Family Medicine

## 2020-08-22 ENCOUNTER — Ambulatory Visit (HOSPITAL_COMMUNITY): Payer: 59 | Admitting: Hematology

## 2020-08-22 DIAGNOSIS — E538 Deficiency of other specified B group vitamins: Secondary | ICD-10-CM | POA: Insufficient documentation

## 2020-08-22 NOTE — Assessment & Plan Note (Signed)
uncontroled , additional med added DASH diet and commitment to daily physical activity for a minimum of 30 minutes discussed and encouraged, as a part of hypertension management. The importance of attaining a healthy weight is also discussed.  BP/Weight 08/18/2020 07/12/2020 07/10/2020 07/08/2020 05/23/2020 05/05/2020 Q000111Q  Systolic BP 0000000 A999333 0000000 [id number] [id number] [id number]  Diastolic BP 70 67 81 80 74 75 80  Wt. (Lbs) 172 170 170 - 178.6 177.4 174  BMI 28.62 28.29 28.29 29.72 29.72 29.52 28.96

## 2020-08-22 NOTE — Assessment & Plan Note (Signed)
Worsening as she continues to smoke, continue current meds and encouraged to continue to work on quitting

## 2020-08-22 NOTE — Assessment & Plan Note (Signed)
Hyperlipidemia:Low fat diet discussed and encouraged.   Lipid Panel  Lab Results  Component Value Date   CHOL 246 (H) 06/02/2020   HDL 73 06/02/2020   LDLCALC 147 (H) 06/02/2020   TRIG 147 06/02/2020   CHOLHDL 3.4 06/02/2020   Uncontrolled, needs to lower fat intake and take meds prescribed

## 2020-08-22 NOTE — Assessment & Plan Note (Signed)
Asked:confirms currently smokes cigarettes 10/day Assess: Unwilling to set a quit date, but is cutting back Advise: needs to QUIT to reduce risk of cancer, cardio and cerebrovascular disease Assist: counseled for 5 minutes and literature provided Arrange: follow up in 2 to 4 months  

## 2020-08-22 NOTE — Assessment & Plan Note (Signed)
Chronic fatigue and low vit B level, commit to daily oral B12, 5000 mcg

## 2020-08-25 ENCOUNTER — Telehealth (HOSPITAL_COMMUNITY): Payer: 59 | Admitting: Nurse Practitioner

## 2020-08-25 ENCOUNTER — Ambulatory Visit (HOSPITAL_COMMUNITY): Payer: 59 | Admitting: Hematology

## 2020-08-25 ENCOUNTER — Inpatient Hospital Stay (HOSPITAL_COMMUNITY): Payer: 59 | Attending: Hematology | Admitting: Nurse Practitioner

## 2020-08-25 ENCOUNTER — Other Ambulatory Visit: Payer: Self-pay

## 2020-08-25 VITALS — BP 154/78 | HR 91 | Temp 98.2°F | Resp 18 | Wt 173.2 lb

## 2020-08-25 DIAGNOSIS — Z87891 Personal history of nicotine dependence: Secondary | ICD-10-CM | POA: Insufficient documentation

## 2020-08-25 DIAGNOSIS — Z853 Personal history of malignant neoplasm of breast: Secondary | ICD-10-CM

## 2020-08-25 DIAGNOSIS — D75839 Thrombocytosis, unspecified: Secondary | ICD-10-CM

## 2020-08-25 DIAGNOSIS — Z170 Estrogen receptor positive status [ER+]: Secondary | ICD-10-CM

## 2020-08-25 DIAGNOSIS — C50212 Malignant neoplasm of upper-inner quadrant of left female breast: Secondary | ICD-10-CM | POA: Diagnosis not present

## 2020-08-25 MED ORDER — MISC. DEVICES MISC
0 refills | Status: DC
Start: 2020-08-25 — End: 2020-08-25

## 2020-08-25 MED ORDER — MISC. DEVICES MISC: 0 refills | Status: DC

## 2020-08-25 NOTE — Patient Instructions (Signed)
[place] at [facility] Discharge Instructions  You were seen today by Beckey Rutter, NP. She discussed your mammogram results and your left breast was normal. Your right breast showed benign (non-cancerous) cyst. She discussed you trying to quit smoking. She discussed your most recent lab work and your platelets (the part of the blood that helps it clot) were elevated. This is most likely due to you smoking. Continue getting your mammograms yearly.  Please follow up as scheduled.   Thank you for choosing [place] at [facility] to provide your oncology and hematology care.  To afford each patient quality time with our provider, please arrive at least 15 minutes before your scheduled appointment time.   If you have a lab appointment with the [facility] please come in thru the Main Entrance and check in at the main information desk.  You need to re-schedule your appointment should you arrive 10 or more minutes late.  We strive to give you quality time with our providers, and arriving late affects you and other patients whose appointments are after yours.  Also, if you no show three or more times for appointments you may be dismissed from the clinic at the providers discretion.     Again, thank you for choosing [facility].  Our hope is that these requests will decrease the amount of time that you wait before being seen by our physicians.       _____________________________________________________________  Should you have questions after your visit to [facility], please contact our office at [phone] and follow the prompts.  Our office hours are 8:00 a.m. and 4:30 p.m. Monday - Friday.  Please note that voicemails left after 4:00 p.m. may not be returned until the following business day.  We are closed weekends and major holidays.  You do have access to a nurse 24-7, just call the main number to the clinic [phone]  and do not press any options, hold on the line and a nurse will answer the phone.    For prescription refill requests, have your pharmacy contact our office and allow 72 hours.    Due to Covid, you will need to wear a mask upon entering the hospital. If you do not have a mask, a mask will be given to you at the Main Entrance upon arrival. For doctor visits, patients may have 1 support person age 79 or older with them. For treatment visits, patients can not have anyone with them due to social distancing guidelines and our immunocompromised population.

## 2020-08-25 NOTE — Progress Notes (Signed)
[name], [place] [address]   Virtual Visit Progress Note  I connected with [name] on 08/25/20 at 10:40 AM EDT by video enabled telemedicine visit and verified that I am speaking with the correct person using two identifiers.   I discussed the limitations, risks, security and privacy concerns of performing an evaluation and management service by telemedicine and the availability of in-person appointments. I also discussed with the patient that there may be a patient responsible charge related to this service. The patient expressed understanding and agreed to proceed.   Other persons participating in the visit and their role in the encounter: Patient, RN, NP   Patient's location: [facility]  Provider's location: [facility]   CLINIC:  Medical Oncology/Hematology  PCP:  [name], MD [address] [place] [address] [phone]   REASON FOR VISIT: Follow-up for breast cancer   CURRENT THERAPY: Surveillance  BRIEF ONCOLOGIC HISTORY:  Oncology History  Breast cancer ([place])  11/15/2004 Surgery   Lumpectomy    - 02/22/2005 Radiation Therapy     12/11/2004 -  Chemotherapy   Aromasin    Adverse Reaction   Intolerance to Aromasin    - 01/14/2010 Chemotherapy   Femara     CANCER STAGING: Cancer Staging Breast cancer ([place]) Staging form: Breast, AJCC 7th Edition - Clinical: Stage IA (T1b, N0, cM0) - Signed by Baird Cancer, PA on 03/11/2012   INTERVAL HISTORY:  [name] 75 y.o. female returns for routine follow-up for breast cancer.  Denies any neurologic complaints. Denies recent fevers or illnesses. Denies any easy bleeding or bruising. No melena or hematochezia. No pica or restless leg. Reports good appetite and denies weight loss. Denies chest pain. Denies any nausea, vomiting, constipation, or diarrhea. Denies urinary complaints. Patient offers no further specific complaints today.  REVIEW OF SYSTEMS:   Review of Systems  Constitutional:  Negative for appetite change, fatigue and unexpected weight change.  HENT:   Negative for mouth sores, sore throat and trouble swallowing.   Respiratory:  Negative for chest tightness and shortness of breath.   Cardiovascular:  Negative for leg swelling.  Gastrointestinal:  Negative for abdominal pain, constipation, diarrhea, nausea and vomiting.  Genitourinary:  Negative for bladder incontinence and dysuria.   Musculoskeletal:  Negative for flank pain and neck stiffness.  Skin:  Negative for itching, rash and wound.  Neurological:  Negative for dizziness, headaches, light-headedness and numbness.  Hematological:  Bruises/bleeds easily.  Psychiatric/Behavioral:  Negative for confusion, depression and sleep disturbance. The patient is not nervous/anxious.     PAST MEDICAL/SURGICAL HISTORY:  Past Medical History:  Diagnosis Date   Breast cancer ([place]) 2007   Stage I (T1b N0 M0), grade 1 well-differentiated carcinoma of the left breast status, post lumpectomy followed by radiation therapy. Her estrogen receptor receptors were 93%, progesterone receptors 67%. HER-2/neu was negative. No lymphovascular space invasion was seen. All margins were clear. Ki-67 marker was low at 1% with surgery on 11/15/2004. Treated then with post-lumpectomy radiation, finish   Breast cancer, left breast ([place]) 2007   COPD (chronic obstructive pulmonary disease) ([place])    Diabetes mellitus without complication (HCC)    diet controlled   Diverticula of colon    Hx of rickettsial disease    Hyperlipidemia    Hypertension    Kidney stones    Nicotine dependence    Osteoarthritis    Past Surgical History:  Procedure Laterality Date  ABDOMINAL HYSTERECTOMY     BREAST SURGERY  2005 approx   left lumpectomy    CATARACT EXTRACTION W/PHACO Left 05/01/2019   Procedure: CATARACT EXTRACTION PHACO AND INTRAOCULAR LENS PLACEMENT LEFT EYE CDE=11.73;  Surgeon: Baruch Goldmann, MD;   Location: [facility];  Service: Ophthalmology;  Laterality: Left;  left   CATARACT EXTRACTION W/PHACO Right 05/15/2019   Procedure: CATARACT EXTRACTION PHACO AND INTRAOCULAR LENS PLACEMENT RIGHT EYE;  Surgeon: Baruch Goldmann, MD;  Location: [facility];  Service: Ophthalmology;  Laterality: Right;  CDE: 16.07   CHOLECYSTECTOMY N/A 05/26/2014   Procedure: LAPAROSCOPIC CHOLECYSTECTOMY;  Surgeon: Aviva Signs Md, MD;  Location: [facility];  Service: General;  Laterality: N/A;   COLECTOMY     2005, diverticulitis   COLONOSCOPY N/A 05/20/2017    Surgeon: Danie Binder, MD;  five 3-6 mm polyps (4 tubular adenomas, 1 hyperplastic polyp), tortuous left colon, internal hemorrhoids.  Recommended repeat colonoscopy in 3 years.   COLONOSCOPY WITH PROPOFOL N/A 07/12/2020   Procedure: COLONOSCOPY WITH PROPOFOL;  Surgeon: Eloise Harman, DO;  Location: [facility];  Service: Endoscopy;  Laterality: N/A;  12:30pm   DILATION AND CURETTAGE OF UTERUS     ESOPHAGOGASTRODUODENOSCOPY N/A 05/20/2017   Surgeon: Danie Binder, MD; normal esophagus and mild gastritis due to aspirin.  Biopsies negative for H. pylori.   left breast      cancer, in 2006   POLYPECTOMY  07/12/2020   Procedure: POLYPECTOMY;  Surgeon: Eloise Harman, DO;  Location: [facility];  Service: Endoscopy;;   TUBAL LIGATION       SOCIAL HISTORY:  Social History   Socioeconomic History   Marital status: Married    Spouse name: Not on file   Number of children: 5   Years of education: 9th grade    Highest education level: 9th grade  Occupational History   Occupation: retired    Fish farm manager: RETIRED  Tobacco Use   Smoking status: Every Day    Packs/day: 0.50    Years: 50.00    Pack years: 25.00    Types: Cigarettes   Smokeless tobacco: Never   Tobacco comments:    smokes 1/2 pack per day 11/19/2019  Vaping Use   Vaping Use: Never used  Substance and Sexual Activity   Alcohol use: No    Alcohol/week: 0.0 standard drinks   Drug use: No    Sexual activity: Not Currently  Other Topics Concern   Not on file  Social History Narrative   ** Merged History Encounter **       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  MARRIED WITH A RUN AWAY HUSBAND(FUGITIVE 10 YRS). 3 MISCARRIAGES, 5 CHILDREN   Social Determinants of Health   Financial Resource Strain: Low Risk    Difficulty of Paying Living Expenses: Not hard at all  Food Insecurity: No Food Insecurity   Worried About Charity fundraiser in the Last Year: Never true   Ran Out of Food in the Last Year: Never true  Transportation Needs: No Transportation Needs   Lack of Transportation (Medical): No   Lack of Transportation (Non-Medical): No  Physical Activity: Sufficiently Active   Days of Exercise per Week: 7 days   Minutes of Exercise per Session: 60 min  Stress: No Stress Concern Present   Feeling of Stress : Not at all  Social Connections: Moderately Isolated   Frequency of Communication with Friends and Family: More than three  times a week   Frequency of Social Gatherings with Friends and Family: Once a week   Attends Religious Services: Never   Marine scientist or Organizations: No   Attends Music therapist: Never   Marital Status: Married  Human resources officer Violence: Not At Risk   Fear of Current or Ex-Partner: No   Emotionally Abused: No   Physically Abused: No   Sexually Abused: No    FAMILY HISTORY:  Family History  Problem Relation Age of Onset   Breast cancer Mother    COPD Father    Emphysema Father    Throat cancer Sister    Glaucoma Brother    Schizophrenia Brother    Heart attack Brother    Lung cancer Sister        former smoker   Hepatitis C Daughter    Seizures Daughter    SIDS Son    Colon cancer Neg Hx    Colon polyps Neg Hx     CURRENT MEDICATIONS:  Outpatient Encounter Medications as of 08/25/2020  Medication Sig   acetaminophen (TYLENOL) 325 MG  tablet Take 650 mg by mouth every 6 (six) hours as needed for moderate pain.    albuterol (PROAIR HFA) 108 (90 Base) MCG/ACT inhaler 2 puffs every 4 hours as needed only  if your can't catch your breath (Patient taking differently: Inhale 2 puffs into the lungs every 4 (four) hours as needed for wheezing or shortness of breath.)   albuterol (PROVENTIL) (2.5 MG/3ML) 0.083% nebulizer solution INHALE ONE VIAL VIA NEBULIZER THREE TIMES DAILY. (Patient taking differently: Take 2.5 mg by nebulization 3 (three) times daily as needed for wheezing or shortness of breath.)   alendronate (FOSAMAX) 70 MG tablet Take 1 tablet (70 mg total) by mouth every 7 (seven) days. Take with a full glass of water on an empty stomach. (Patient taking differently: Take 70 mg by mouth every Sunday. Take with a full glass of water on an empty stomach.)   aspirin EC 81 MG tablet Take 81 mg by mouth daily.   ATROVENT HFA 17 MCG/ACT inhaler INHALE 2 PUFFS BY MOUTH FOUR TIMES A DAY.   benzonatate (TESSALON) 100 MG capsule Take 1 capsule (100 mg total) by mouth 2 (two) times daily as needed for cough.   Calcium Carbonate-Vitamin D (CALCIUM 600 + D PO) Take 1 tablet by mouth 2 (two) times daily.   Cyanocobalamin (VITAMIN B 12) 500 MCG TABS Take one tablet by mouth once daily   cyclobenzaprine (FLEXERIL) 5 MG tablet Take 1 tablet (5 mg total) by mouth at bedtime as needed.   diphenhydramine-acetaminophen (TYLENOL PM) 25-500 MG TABS tablet Take 1 tablet by mouth at bedtime as needed (pain).   Emollient (VASELINE INTENSIVE CARE EX) Apply 1 application topically daily.   famotidine (PEPCID) 20 MG tablet Take 20 mg by mouth at bedtime.    fluticasone (FLONASE) 50 MCG/ACT nasal spray USE 2 SPRAYS IN EACH NOSTRIL DAILY.   irbesartan (AVAPRO) 300 MG tablet Take 1 tablet (300 mg total) by mouth daily.   loratadine (CLARITIN) 10 MG tablet TAKE 1 TABLET BY MOUTH ONCE DAILY FOR ALLERGIES. (Patient taking differently: Take 10 mg by mouth daily.)    montelukast (SINGULAIR) 10 MG tablet TAKE 1 TABLET BY MOUTH AT BEDTIME. (Patient taking differently: Take 10 mg by mouth at bedtime.)   Naphazoline HCl (CLEAR EYES OP) Place 1 drop into both eyes daily as needed (irritation).   omeprazole (PRILOSEC) 20 MG capsule Take  20 mg by mouth daily as needed (acid reflux).   polyethylene glycol powder (GLYCOLAX/MIRALAX) powder MIX 1 CAPFUL IN 8 OUNCES OF JUICE OR WATER AND DRINK ONCE DAILY. (Patient taking differently: Take 17 g by mouth daily.)   potassium chloride SA (KLOR-CON) 20 MEQ tablet TAKE 1 TABLET BY MOUTH DAILY. (Patient taking differently: Take 20 mEq by mouth daily.)   rosuvastatin (CRESTOR) 10 MG tablet Take 1 tablet (10 mg total) by mouth daily.   spironolactone (ALDACTONE) 25 MG tablet Take 1 tablet (25 mg total) by mouth daily.   SYMBICORT 160-4.5 MCG/ACT inhaler INHALE 2 PUFFS INTO THE LUNGS TWICE DAILY.   traMADol (ULTRAM) 50 MG tablet TAKE (1) TABLET BY MOUTH EVERY SIX HOURS AS NEEDED.   UNABLE TO [place] to evaluate and tx as indicated for SN. Dx: G30.9   No facility-administered encounter medications on file as of 08/25/2020.    ALLERGIES:  Allergies  Allergen Reactions   Penicillins Hives, Shortness Of Breath and Swelling    Has patient had a PCN reaction causing immediate rash, facial/tongue/throat swelling, SOB or lightheadedness with hypotension: yes Has patient had a PCN reaction causing severe rash involving mucus membranes or skin necrosis:no Has patient had a PCN reaction that required hospitalization: yes Has patient had a PCN reaction occurring within the last 10 years: no If all of the above answers are "NO", then may proceed with Cephalosporin use.   Sulfonamide Derivatives Other (See Comments)    Shaking all over, seizure like symptoms. Hospitalization resulted      PHYSICAL EXAM:  ECOG Performance status: 1  Vitals:   08/25/20 1057  BP: (!) 154/78  Pulse: 91  Resp: 18  Temp: 98.2 F (36.8 C)   SpO2: 97%   Filed Weights   08/25/20 1057  Weight: 173 lb 3.2 oz (78.6 kg)   Physical Exam Constitutional:      General: She is not in acute distress. HENT:     Head: Normocephalic.  Pulmonary:     Effort: No respiratory distress.  Neurological:     Mental Status: She is alert and oriented to person, place, and time.  Psychiatric:        Mood and Affect: Mood normal.        Behavior: Behavior normal.     LABORATORY DATA:  I have reviewed the labs as listed.  CBC    Component Value Date/Time   WBC 9.9 07/10/2020 1025   RBC 5.04 07/10/2020 1025   HGB 15.0 07/10/2020 1025   HGB 14.8 06/02/2020 1307   HCT 47.5 (H) 07/10/2020 1025   HCT 46.6 06/02/2020 1307   PLT 454 (H) 07/10/2020 1025   PLT 482 (H) 06/02/2020 1307   MCV 94.2 07/10/2020 1025   MCV 92 06/02/2020 1307   MCH 29.8 07/10/2020 1025   MCHC 31.6 07/10/2020 1025   RDW 14.4 07/10/2020 1025   RDW 13.9 06/02/2020 1307   LYMPHSABS 2.1 07/10/2020 1025   LYMPHSABS 2.9 02/16/2020 1456   MONOABS 1.3 (H) 07/10/2020 1025   EOSABS 0.2 07/10/2020 1025   EOSABS 0.4 02/16/2020 1456   BASOSABS 0.1 07/10/2020 1025   BASOSABS 0.1 02/16/2020 1456   CMP Latest Ref Rng & Units 07/10/2020 07/01/2020 06/02/2020  Glucose 70 - 99 mg/dL 99 117(H) 86  BUN 8 - 23 mg/dL 17 31(H) 29(H)  Creatinine 0.44 - 1.00 mg/dL 0.70 0.90 1.03(H)  Sodium 135 - 145 mmol/L 138 139 141  Potassium 3.5 - 5.1 mmol/L 4.4 4.4 5.2  Chloride 98 - 111 mmol/L 103 104 101  CO2 22 - 32 mmol/L '28 26 23  ' Calcium 8.9 - 10.3 mg/dL 9.6 9.5 9.9  Total Protein 6.5 - 8.1 g/dL 7.4 7.8 7.5  Total Bilirubin 0.3 - 1.2 mg/dL 0.4 0.5 0.3  Alkaline Phos 38 - 126 U/L 71 72 83  AST 15 - 41 U/L '20 20 15  ' ALT 0 - 44 U/L '21 26 16    ' DIAGNOSTIC IMAGING:  I have independently reviewed the mammogram scans and discussed with the patient.  ASSESSMENT & PLAN:  No problem-specific Assessment & Plan notes found for this encounter.  1.  Stage Ia invasive ductal carcinoma the  left breast: -Patient was diagnosed in 2006.  She had a lumpectomy on 11/15/2004 which showed invasive ductal carcinoma; ER+/PR+/HER-2-.  Ki 67 1%. -Her lumpectomy was followed by adjuvant radiation therapy which she completed 02/22/2005. -She started antiestrogen therapy with Aromasin on 11/2004, she switched to Femara due to intolerance.  She completed a total of 5 years of antiestrogen therapy on 01/14/2010. - She will return in 1 year with repeat mammo-and labs.  Patient wants to continue seeing [place] yearly even though she is more than 15 years out from her breast cancer. - left breast was negative for malignancy on 07/03/20 mammogram.  - repeat mammogram bilateral in June 2023.    2.  Leukocytosis: -Patient has smoking induced leukocytosis. -She has documented leukocytosis since 2009. -normal today. Monitor.   3. Right Breast cyst - on exam in August 2022. Seen on u/s. Monitor.   4. Osteopenia - bone density on 04/18/20 showed t score -2.0 consistent with osteopenia - managed by her pcp   5. Thrombocytosis - platelet count with pcp 454  6. Smoker- - smoking cessation encouraged - continue LDCT  7. Breast asymmetry - prescription for prosthesis bras   Orders placed this encounter:  No orders of the defined types were placed in this encounter.  Return to clinic  Mammogram in June 2023 with ultrasound Labs ([facility]), Dr. Raliegh Ip   I discussed the assessment and treatment plan with the patient. The patient was provided an opportunity to ask questions and all were answered. The patient agreed with the plan and demonstrated an understanding of the instructions.   The patient was advised to call back or seek an in-person evaluation if the symptoms worsen or if the condition fails to improve as anticipated.   I spent 20 minutes face-to-face video visit time dedicated to the care of this patient on the date of this encounter to include pre-visit review of medical oncology notes, imaging, labs,  face-to-face time with the patient, and post visit ordering of testing/documentation.    Beckey Rutter, DNP, AGNP-C [place] [phone]

## 2020-08-29 ENCOUNTER — Other Ambulatory Visit: Payer: Self-pay | Admitting: Family Medicine

## 2020-08-30 ENCOUNTER — Encounter: Payer: Self-pay | Admitting: Orthopaedic Surgery

## 2020-08-30 ENCOUNTER — Ambulatory Visit (INDEPENDENT_AMBULATORY_CARE_PROVIDER_SITE_OTHER): Payer: 59 | Admitting: Orthopaedic Surgery

## 2020-08-30 ENCOUNTER — Other Ambulatory Visit: Payer: Self-pay

## 2020-08-30 DIAGNOSIS — F17210 Nicotine dependence, cigarettes, uncomplicated: Secondary | ICD-10-CM

## 2020-08-30 DIAGNOSIS — M25561 Pain in right knee: Secondary | ICD-10-CM

## 2020-08-30 DIAGNOSIS — G8929 Other chronic pain: Secondary | ICD-10-CM

## 2020-08-30 NOTE — Progress Notes (Signed)
PROCEDURE NOTE:  The patient requests injections of the right knee , verbal consent was obtained.  The right knee was prepped appropriately after time out was performed.   Sterile technique was observed and injection of 1 cc of Celestone '6mg'$  with several cc's of plain xylocaine. Anesthesia was provided by ethyl chloride and a 20-gauge needle was used to inject the knee area. The injection was tolerated well.  A band aid dressing was applied.  The patient was advised to apply ice later today and tomorrow to the injection sight as needed.   Return in three months.  Call if any problem.  Precautions discussed.  Electronically Signed Sanjuana Kava, MD 8/16/20222:07 PM

## 2020-08-30 NOTE — Patient Instructions (Signed)
Steps to Quit Smoking Smoking tobacco is the leading cause of preventable death. It can affect almost every organ in the body. Smoking puts you and people around you at risk for many serious, long-lasting (chronic) diseases. Quitting smoking can be hard, but it is one of the best things that you can do for your health. It is never too late to quit. How do I get ready to quit? When you decide to quit smoking, make a plan to help you succeed. Before you quit: Pick a date to quit. Set a date within the next 2 weeks to give you time to prepare. Write down the reasons why you are quitting. Keep this list in places where you will see it often. Tell your family, friends, and co-workers that you are quitting. Their support is important. Talk with your doctor about the choices that may help you quit. Find out if your health insurance will pay for these treatments. Know the people, places, things, and activities that make you want to smoke (triggers). Avoid them. What first steps can I take to quit smoking? Throw away all cigarettes at home, at work, and in your car. Throw away the things that you use when you smoke, such as ashtrays and lighters. Clean your car. Make sure to empty the ashtray. Clean your home, including curtains and carpets. What can I do to help me quit smoking? Talk with your doctor about taking medicines and seeing a counselor at the same time. You are more likely to succeed when you do both. If you are pregnant or breastfeeding, talk with your doctor about counseling or other ways to quit smoking. Do not take medicine to help you quit smoking unless your doctor tells you to do so. To quit smoking: Quit right away Quit smoking totally, instead of slowly cutting back on how much you smoke over a period of time. Go to counseling. You are more likely to quit if you go to counseling sessions regularly. Take medicine You may take medicines to help you quit. Some medicines need a  prescription, and some you can buy over-the-counter. Some medicines may contain a drug called nicotine to replace the nicotine in cigarettes. Medicines may: Help you to stop having the desire to smoke (cravings). Help to stop the problems that come when you stop smoking (withdrawal symptoms). Your doctor may ask you to use: Nicotine patches, gum, or lozenges. Nicotine inhalers or sprays. Non-nicotine medicine that is taken by mouth. Find resources Find resources and other ways to help you quit smoking and remain smoke-free after you quit. These resources are most helpful when you use them often. They include: Online chats with a counselor. Phone quitlines. Printed self-help materials. Support groups or group counseling. Text messaging programs. Mobile phone apps. Use apps on your mobile phone or tablet that can help you stick to your quit plan. There are many free apps for mobile phones and tablets as well as websites. Examples include Quit Guide from the CDC and smokefree.gov  What things can I do to make it easier to quit?  Talk to your family and friends. Ask them to support and encourage you. Call a phone quitline (1-800-QUIT-NOW), reach out to support groups, or work with a counselor. Ask people who smoke to not smoke around you. Avoid places that make you want to smoke, such as: Bars. Parties. Smoke-break areas at work. Spend time with people who do not smoke. Lower the stress in your life. Stress can make you want to   smoke. Try these things to help your stress: Getting regular exercise. Doing deep-breathing exercises. Doing yoga. Meditating. Doing a body scan. To do this, close your eyes, focus on one area of your body at a time from head to toe. Notice which parts of your body are tense. Try to relax the muscles in those areas. How will I feel when I quit smoking? Day 1 to 3 weeks Within the first 24 hours, you may start to have some problems that come from quitting tobacco.  These problems are very bad 2-3 days after you quit, but they do not often last for more than 2-3 weeks. You may get these symptoms: Mood swings. Feeling restless, nervous, angry, or annoyed. Trouble concentrating. Dizziness. Strong desire for high-sugar foods and nicotine. Weight gain. Trouble pooping (constipation). Feeling like you may vomit (nausea). Coughing or a sore throat. Changes in how the medicines that you take for other issues work in your body. Depression. Trouble sleeping (insomnia). Week 3 and afterward After the first 2-3 weeks of quitting, you may start to notice more positive results, such as: Better sense of smell and taste. Less coughing and sore throat. Slower heart rate. Lower blood pressure. Clearer skin. Better breathing. Fewer sick days. Quitting smoking can be hard. Do not give up if you fail the first time. Some people need to try a few times before they succeed. Do your best to stick to your quit plan, and talk with your doctor if you have any questions or concerns. Summary Smoking tobacco is the leading cause of preventable death. Quitting smoking can be hard, but it is one of the best things that you can do for your health. When you decide to quit smoking, make a plan to help you succeed. Quit smoking right away, not slowly over a period of time. When you start quitting, seek help from your doctor, family, or friends. This information is not intended to replace advice given to you by your health care provider. Make sure you discuss any questions you have with your health care provider. Document Revised: 09/26/2018 Document Reviewed: 03/22/2018 Elsevier Patient Education  2022 [employer]vier Inc.  

## 2020-09-02 ENCOUNTER — Telehealth: Payer: Self-pay

## 2020-09-02 ENCOUNTER — Other Ambulatory Visit: Payer: Self-pay

## 2020-09-02 DIAGNOSIS — M8580 Other specified disorders of bone density and structure, unspecified site: Secondary | ICD-10-CM

## 2020-09-02 MED ORDER — ALENDRONATE SODIUM 70 MG PO TABS: 70.0000 mg | ORAL_TABLET | ORAL | 11 refills | Status: DC

## 2020-09-02 NOTE — Telephone Encounter (Signed)
Refilled alendronate

## 2020-09-02 NOTE — Assessment & Plan Note (Signed)
Generalized arthritis affecting spine , knees shoulders. Increased fall risk Difficulty with bathing and dressing Needs bath seat-  Bath shower chair with orwithout wheels for increased independence and safety

## 2020-09-02 NOTE — Telephone Encounter (Signed)
Patient called need med refills.  benzonatate (TESSALON) 100 MG capsule  alendronate (FOSAMAX) 70 MG tablet   Pharmacy: [facility]

## 2020-09-06 ENCOUNTER — Other Ambulatory Visit: Payer: Self-pay | Admitting: Orthopaedic Surgery

## 2020-09-19 ENCOUNTER — Other Ambulatory Visit: Payer: Self-pay | Admitting: Family Medicine

## 2020-09-19 ENCOUNTER — Other Ambulatory Visit: Payer: Self-pay | Admitting: Internal Medicine

## 2020-09-19 DIAGNOSIS — J3089 Other allergic rhinitis: Secondary | ICD-10-CM

## 2020-09-19 DIAGNOSIS — R918 Other nonspecific abnormal finding of lung field: Secondary | ICD-10-CM

## 2020-09-19 DIAGNOSIS — J449 Chronic obstructive pulmonary disease, unspecified: Secondary | ICD-10-CM

## 2020-09-22 ENCOUNTER — Ambulatory Visit: Payer: 59 | Admitting: Family Medicine

## 2020-09-30 ENCOUNTER — Ambulatory Visit (INDEPENDENT_AMBULATORY_CARE_PROVIDER_SITE_OTHER): Payer: 59 | Admitting: Family Medicine

## 2020-09-30 ENCOUNTER — Other Ambulatory Visit: Payer: Self-pay

## 2020-09-30 DIAGNOSIS — U071 COVID-19: Secondary | ICD-10-CM | POA: Diagnosis not present

## 2020-09-30 MED ORDER — NIRMATRELVIR/RITONAVIR (PAXLOVID)TABLET: 3.0000 | ORAL_TABLET | Freq: Two times a day (BID) | ORAL | 0 refills | Status: AC

## 2020-09-30 NOTE — Patient Instructions (Signed)
F/U as before, call if you need me sooner  Paxlovid is prescribed for your infection.   Remain at home for the next week  and use a face mask and practice distancing when you go out  Thanks for choosing [facility], we consider it a privelige to serve you.

## 2020-09-30 NOTE — Progress Notes (Signed)
Virtual Visit via Telephone Note  I connected with [name] on 09/30/20 at  1:00 PM EDT by telephone and verified that I am speaking with the correct person using two identifiers.  Location: Patient: home Provider: office   I discussed the limitations, risks, security and privacy concerns of performing an evaluation and management service by telephone and the availability of in person appointments. I also discussed with the patient that there may be a patient responsible charge related to this service. The patient expressed understanding and agreed to proceed.   History of Present Illness: 3 day h/o not feeling well, could barely turn her head, positive covid test yesterday. 2 other family members are currently infected Takes tylenol for pain regularly, couh chronic , no excessive shortness of breath. Has significant chronic lung disease   Observations/Objective: There were no vitals taken for this visit. Good communication with no confusion and intact memory. Alert and oriented x 3 No signs of respiratory distress during speech   Assessment and Plan: COVID-19 virus infection sympotms started 2 days ago and positive test result yesterday, 5 day course paxlovid sent , no dose adjustment needed   Follow Up Instructions:    I discussed the assessment and treatment plan with the patient. The patient was provided an opportunity to ask questions and all were answered. The patient agreed with the plan and demonstrated an understanding of the instructions.   The patient was advised to call back or seek an in-person evaluation if the symptoms worsen or if the condition fails to improve as anticipated.  I provided 8 minutes of non-face-to-face time during this encounter.   Tanya Nakayama, MD

## 2020-09-30 NOTE — Assessment & Plan Note (Signed)
sympotms started 2 days ago and positive test result yesterday, 5 day course paxlovid sent , no dose adjustment needed

## 2020-10-03 ENCOUNTER — Telehealth: Payer: Self-pay | Admitting: Orthopedic Surgery

## 2020-10-12 ENCOUNTER — Other Ambulatory Visit: Payer: Self-pay

## 2020-10-12 ENCOUNTER — Ambulatory Visit (INDEPENDENT_AMBULATORY_CARE_PROVIDER_SITE_OTHER): Payer: 59 | Admitting: Family Medicine

## 2020-10-12 ENCOUNTER — Encounter: Payer: Self-pay | Admitting: Family Medicine

## 2020-10-12 DIAGNOSIS — J0100 Acute maxillary sinusitis, unspecified: Secondary | ICD-10-CM | POA: Diagnosis not present

## 2020-10-12 DIAGNOSIS — J209 Acute bronchitis, unspecified: Secondary | ICD-10-CM

## 2020-10-12 DIAGNOSIS — H9202 Otalgia, left ear: Secondary | ICD-10-CM

## 2020-10-12 DIAGNOSIS — J440 Chronic obstructive pulmonary disease with acute lower respiratory infection: Secondary | ICD-10-CM | POA: Diagnosis not present

## 2020-10-12 DIAGNOSIS — F17210 Nicotine dependence, cigarettes, uncomplicated: Secondary | ICD-10-CM

## 2020-10-12 MED ORDER — DOXYCYCLINE HYCLATE 100 MG PO TABS: 100.0000 mg | ORAL_TABLET | Freq: Two times a day (BID) | ORAL | 0 refills | Status: DC

## 2020-10-12 MED ORDER — PROMETHAZINE-DM 6.25-15 MG/5ML PO SYRP: ORAL_SOLUTION | ORAL | 0 refills | Status: DC

## 2020-10-12 MED ORDER — BENZONATATE 100 MG PO CAPS: 100.0000 mg | ORAL_CAPSULE | Freq: Two times a day (BID) | ORAL | 0 refills | Status: DC | PRN

## 2020-10-12 NOTE — Assessment & Plan Note (Signed)
Needs CXR, doxycycline , tessalon perle and phenergan DM prescribed

## 2020-10-12 NOTE — Assessment & Plan Note (Signed)
Doxycycline prescribed

## 2020-10-12 NOTE — Assessment & Plan Note (Signed)
Asked:confirms currently smokes cigarettes °Assess: Unwilling to set a quit date, but is cutting back °Advise: needs to QUIT to reduce risk of cancer, cardio and cerebrovascular disease °Assist: counseled for 5 minutes and literature provided °Arrange: follow up in 2 to 4 months ° °

## 2020-10-12 NOTE — Patient Instructions (Signed)
Follow-up in office as before November call if you need me sooner.  You are treated for acute sinusitis and bronchitis.  Doxycycline ,Tessalon Perles and Phenergan DM are prescribed.  Please get a chest x-ray within the next 1 week as we discussed.  It is important that you continue to work intentionally on stopping smoking as this increases your risk of complications with lung infections increases the rate at which you get lung infections and also chronic illness.  Thanks for choosing [facility], we consider it a privelige to serve you.

## 2020-10-12 NOTE — Progress Notes (Signed)
Virtual Visit via Telephone Note  I connected with [name] on 10/12/20 at  9:40 AM EDT by telephone and verified that I am speaking with the correct person using two identifiers.  Location: Patient: home Provider: office   I discussed the limitations, risks, security and privacy concerns of performing an evaluation and management service by telephone and the availability of in person appointments. I also discussed with the patient that there may be a patient responsible charge related to this service. The patient expressed understanding and agreed to proceed.   History of Present Illness: 3-day history of head and chest congestion with green sputum and chills.  Patient recently treated for COVID infection was improving and now has developed new symptoms with increasing shortness of breath.  She still continues to smoke.  Complains of left ear pain denies drainage.     Observations/Objective: There were no vitals taken for this visit. Good communication with no confusion and intact memory. Alert and oriented x 3  Assessment and Plan:  Acute bronchitis with COPD ([place]) Needs CXR, doxycycline , tessalon perle and phenergan DM prescribed  Acute sinus infection Doxycycline prescribed  Acute otalgia, left Doxycycline prescribed  Cigarette smoker Asked:confirms currently smokes cigarettes Assess: Unwilling to set a quit date, but is cutting back Advise: needs to QUIT to reduce risk of cancer, cardio and cerebrovascular disease Assist: counseled for 5 minutes and literature provided Arrange: follow up in 2 to 4 months   Follow Up Instructions:    I discussed the assessment and treatment plan with the patient. The patient was provided an opportunity to ask questions and all were answered. The patient agreed with the plan and demonstrated an understanding of the instructions.   The patient was advised to call back or seek an in-person evaluation if the symptoms worsen  or if the condition fails to improve as anticipated.  I provided 60minutes of non-face-to-face time during this encounter.   [name], MD

## 2020-10-17 ENCOUNTER — Telehealth: Payer: Self-pay | Admitting: Family Medicine

## 2020-10-17 ENCOUNTER — Other Ambulatory Visit: Payer: Self-pay | Admitting: Family Medicine

## 2020-10-17 ENCOUNTER — Other Ambulatory Visit: Payer: Self-pay | Admitting: Internal Medicine

## 2020-10-17 DIAGNOSIS — R918 Other nonspecific abnormal finding of lung field: Secondary | ICD-10-CM

## 2020-10-17 DIAGNOSIS — J3089 Other allergic rhinitis: Secondary | ICD-10-CM

## 2020-10-17 DIAGNOSIS — J449 Chronic obstructive pulmonary disease, unspecified: Secondary | ICD-10-CM

## 2020-10-17 NOTE — Telephone Encounter (Signed)
Patient called in statting that her Doxycycline isnt working for her. May be too rough

## 2020-10-18 NOTE — Telephone Encounter (Signed)
Still has sore throat and cough and congestion. Mucus still yellowish,  Doxycycline not really helping- has been taking since Sept 28. Hasn't been able to check temp but has been taking tylenol every 6 hrs based on how she feels (feels feverish)

## 2020-10-19 ENCOUNTER — Other Ambulatory Visit: Payer: Self-pay

## 2020-10-19 ENCOUNTER — Ambulatory Visit (HOSPITAL_COMMUNITY)
Admission: RE | Admit: 2020-10-19 | Discharge: 2020-10-19 | Disposition: A | Discharge status: Home / Self Care | Payer: 59 | Source: Ambulatory Visit | Attending: Family Medicine | Admitting: Family Medicine

## 2020-10-19 ENCOUNTER — Telehealth: Payer: Self-pay | Admitting: Radiology

## 2020-10-19 ENCOUNTER — Telehealth: Payer: Self-pay

## 2020-10-19 DIAGNOSIS — J440 Chronic obstructive pulmonary disease with acute lower respiratory infection: Secondary | ICD-10-CM | POA: Diagnosis not present

## 2020-10-19 DIAGNOSIS — U071 COVID-19: Secondary | ICD-10-CM

## 2020-10-19 DIAGNOSIS — J209 Acute bronchitis, unspecified: Secondary | ICD-10-CM | POA: Diagnosis present

## 2020-10-19 MED ORDER — CYCLOBENZAPRINE HCL 5 MG PO TABS: 5.0000 mg | ORAL_TABLET | Freq: Every evening | ORAL | 0 refills | Status: DC | PRN

## 2020-10-19 NOTE — Telephone Encounter (Signed)
Patient called had xray done, please contact patient back # [phone].

## 2020-10-19 NOTE — Telephone Encounter (Signed)
Will go have done as soon as able

## 2020-10-20 NOTE — Telephone Encounter (Signed)
Pt states she had the chest xray yesterday and is needing some meds. (The imaging has not been read by radiology currently)

## 2020-10-21 ENCOUNTER — Telehealth: Payer: Self-pay | Admitting: Family Medicine

## 2020-10-21 NOTE — Telephone Encounter (Signed)
Advised pt of recommendations and she said she has been gargling in salt water but her throat is more sore now than it was. Said she wants antibiotic. Please advise

## 2020-10-21 NOTE — Telephone Encounter (Signed)
Pt needs something for a sore throat

## 2020-10-21 NOTE — Telephone Encounter (Signed)
Still sore throat and cough with yellow congestion

## 2020-10-21 NOTE — Telephone Encounter (Signed)
Message has been sent to Dr. Julieta Bellini message

## 2020-10-24 ENCOUNTER — Telehealth: Payer: Self-pay | Admitting: Orthopaedic Surgery

## 2020-10-24 MED ORDER — TRAMADOL HCL 50 MG PO TABS: ORAL_TABLET | ORAL | 2 refills | Status: DC

## 2020-10-24 NOTE — Telephone Encounter (Signed)
Done

## 2020-10-24 NOTE — Telephone Encounter (Signed)
Patient aware.

## 2020-10-24 NOTE — Telephone Encounter (Signed)
Patient called for refill: traMADol (ULTRAM) 50 MG tablet 60 tablet    [facility]

## 2020-11-10 LAB — CMP14+EGFR
ALT: 9 IU/L (ref 0–32)
AST: 14 IU/L (ref 0–40)
Albumin/Globulin Ratio: 1.7 (ref 1.2–2.2)
Albumin: 4.5 g/dL (ref 3.7–4.7)
Alkaline Phosphatase: 74 IU/L (ref 44–121)
BUN/Creatinine Ratio: 17 (ref 12–28)
BUN: 16 mg/dL (ref 8–27)
Bilirubin Total: 0.4 mg/dL (ref 0.0–1.2)
CO2: 24 mmol/L (ref 20–29)
Calcium: 10 mg/dL (ref 8.7–10.3)
Chloride: 102 mmol/L (ref 96–106)
Creatinine, Ser: 0.93 mg/dL (ref 0.57–1.00)
Globulin, Total: 2.6 g/dL (ref 1.5–4.5)
Glucose: 98 mg/dL (ref 70–99)
Potassium: 5.1 mmol/L (ref 3.5–5.2)
Sodium: 142 mmol/L (ref 134–144)
Total Protein: 7.1 g/dL (ref 6.0–8.5)
eGFR: 64 mL/min/{1.73_m2} (ref >59)

## 2020-11-10 LAB — LIPID PANEL
Chol/HDL Ratio: 2.6 ratio (ref 0.0–4.4)
Cholesterol, Total: 174 mg/dL (ref 100–199)
HDL: 66 mg/dL (ref >39)
LDL Chol Calc (NIH): 84 mg/dL (ref 0–99)
Triglycerides: 138 mg/dL (ref 0–149)
VLDL Cholesterol Cal: 24 mg/dL (ref 5–40)

## 2020-11-10 LAB — HEMOGLOBIN A1C
Est. average glucose Bld gHb Est-mCnc: 123 mg/dL
Hgb A1c MFr Bld: 5.9 % — ABNORMAL HIGH (ref 4.8–5.6)

## 2020-11-10 LAB — VITAMIN B12: Vitamin B-12: 403 pg/mL (ref 232–1245)

## 2020-11-15 ENCOUNTER — Other Ambulatory Visit: Payer: Self-pay | Admitting: Internal Medicine

## 2020-11-15 ENCOUNTER — Ambulatory Visit
Admission: EM | Admit: 2020-11-15 | Discharge: 2020-11-15 | Disposition: A | Discharge status: Home / Self Care | Payer: 59

## 2020-11-15 DIAGNOSIS — J449 Chronic obstructive pulmonary disease, unspecified: Secondary | ICD-10-CM

## 2020-11-15 DIAGNOSIS — R918 Other nonspecific abnormal finding of lung field: Secondary | ICD-10-CM

## 2020-11-16 ENCOUNTER — Encounter: Payer: Self-pay | Admitting: Internal Medicine

## 2020-11-16 ENCOUNTER — Ambulatory Visit (INDEPENDENT_AMBULATORY_CARE_PROVIDER_SITE_OTHER): Payer: 59 | Admitting: Internal Medicine

## 2020-11-16 ENCOUNTER — Other Ambulatory Visit: Payer: Self-pay

## 2020-11-16 VITALS — BP 134/78 | HR 101 | Temp 98.7°F | Ht 65.0 in | Wt 174.0 lb

## 2020-11-16 DIAGNOSIS — J449 Chronic obstructive pulmonary disease, unspecified: Secondary | ICD-10-CM | POA: Diagnosis not present

## 2020-11-16 DIAGNOSIS — J209 Acute bronchitis, unspecified: Secondary | ICD-10-CM | POA: Diagnosis not present

## 2020-11-16 DIAGNOSIS — F17210 Nicotine dependence, cigarettes, uncomplicated: Secondary | ICD-10-CM | POA: Diagnosis not present

## 2020-11-16 DIAGNOSIS — J440 Chronic obstructive pulmonary disease with acute lower respiratory infection: Secondary | ICD-10-CM

## 2020-11-16 MED ORDER — PANTOPRAZOLE SODIUM 40 MG PO TBEC: 40.0000 mg | DELAYED_RELEASE_TABLET | Freq: Every day | ORAL | 2 refills | Status: DC

## 2020-11-16 MED ORDER — METHYLPREDNISOLONE ACETATE 80 MG/ML IJ SUSP
120.0000 mg | Freq: Once | INTRAMUSCULAR | Status: AC
  Administered 2020-11-16: 120 mg via INTRAMUSCULAR

## 2020-11-16 NOTE — Patient Instructions (Addendum)
Stop fosamax (alendronate) and prilosec (omeprazole )  Pantoprazole (protonix) 40 mg   Take  30-60 min before first meal of the day and Pepcid (famotidine)  20 mg after supper until return to office - this is the best way to tell whether stomach acid is contributing to your problem.    For cough > mucinex dm up to 1200 mg every 12 hours as needed  Depomedrol 120 mg IM today    GERD (REFLUX)  is an extremely common cause of respiratory symptoms just like yours , many times with no obvious heartburn at all.    It can be treated with medication, but also with lifestyle changes including elevation of the head of your bed (ideally with 6 -8inch blocks under the headboard of your bed),  Smoking cessation, avoidance of late meals, excessive alcohol, and avoid fatty foods, chocolate, peppermint, colas, red wine, and acidic juices such as orange juice.  NO MINT OR MENTHOL PRODUCTS SO NO COUGH DROPS  USE SUGARLESS CANDY INSTEAD (Jolley ranchers or Stover's or Life Savers) or even ice chips will also do - the key is to swallow to prevent all throat clearing. NO OIL BASED VITAMINS - use powdered substitutes.  Avoid fish oil when coughing.  Plan A = Automatic = Always=   Symbicort 80 or 160 Take 2 puffs first thing in am and then another 2 puffs about 12 hours later.    Work on inhaler technique:  relax and gently blow all the way out then take a nice smooth full deep breath back in, triggering the inhaler at same time you start breathing in.  Hold for up to 5 seconds if you can. Blow out thru nose. Rinse and gargle with water when done.  If mouth or throat bother you at all,  try brushing teeth/gums/tongue with arm and hammer toothpaste/ make a slurry and gargle and spit out.   Keep the empty symbicort and practice like  a golfer  Plan B = Backup (to supplement plan A, not to replace it) Only use your albuterol inhaler as a rescue medication to be used if you can't catch your breath by resting or doing a  relaxed purse lip breathing pattern.  - The less you use it, the better it will work when you need it. - Ok to use the inhaler up to 2 puffs  every 4 hours if you must but call for appointment if use goes up over your usual need - Don't leave home without it !!  (think of it like the spare tire for your car)   Plan C = Crisis (instead of Plan B but only if Plan B stops working) - only use your albuterol nebulizer if you first try Plan B and it fails to help > ok to use the nebulizer up to every 4 hours but if start needing it regularly call for immediate appointment    Please schedule a follow up office visit in 6 weeks, call sooner if needed with all medications /inhalers/ solutions in hand so we can verify exactly what you are taking. This includes all medications from all doctors and over the [place] separate them into two bags:  the ones you take automatically, no matter what, vs the ones you take just when you feel you need them "BAG #2 is UP TO YOU"  - this will really help [place] help you take your medications more effectively.

## 2020-11-16 NOTE — Assessment & Plan Note (Addendum)
Active smoker  - PFT's  03/03/2012  FEV1 1.69 (69 % ) ratio 0.72  p 5 % improvement from saba p ? prior to study with DLCO  17.42 (68%) corrects to 4.53 (92%)  for alv volume and FV curve min curvature/ ERV 37%   - try off acei 11/19/2019 and return for medication reconciliation in 6 weeks - 11/16/2020  After extensive coaching inhaler device,  effectiveness =    75% from a baseline of < 25%   DDX of  difficult airways management almost all start with A and  include Adherence, Ace Inhibitors, Acid Reflux, Active Sinus Disease, Alpha 1 Antitripsin deficiency, Anxiety masquerading as Airways dz,  ABPA,  Allergy(esp in young), Aspiration (esp in elderly), Adverse effects of meds,  Active smoking or vaping, A bunch of PE's (a small clot burden can't cause this syndrome unless there is already severe underlying pulm or vascular dz with poor reserve) plus two Bs  = Bronchiectasis and Beta blocker use..and one C= CHF  Adherence is always the initial "prime suspect" and is a multilayered concern that requires a "trust but verify" approach in every patient - starting with knowing how to use medications, especially inhalers, correctly, keeping up with refills and understanding the fundamental difference between maintenance and prns vs those medications only taken for a very short course and then stopped and not refilled.  -see hfa coaching - return with all meds in hand using a trust but verify approach to confirm accurate Medication  Reconciliation The principal here is that until we are certain that the  patients are doing what we've asked, it makes no sense to ask them to do more.   Active smoking top of the rest of the list of usual suspects  ? Acid (or non-acid) GERD > always difficult to exclude as up to 75% of pts in some series report no assoc GI/ Heartburn symptoms> rec max (24h)  acid suppression and diet restrictions/ reviewed and instructions given in writing.   ? Adverse drug effects > try off fosamax  for now   ? Allergy > continue singulair / add Prednisone 10 mg take  4 each am x 2 days,   2 each am x 2 days,  1 each am x 2 days and stop   ? Anxiety/ depression/ ? Cognitive impairment > usually at the bottom of this list of usual suspects but may interfere with adherence and also interpretation of response or lack thereof to symptom management which can be quite subjective.

## 2020-11-16 NOTE — Assessment & Plan Note (Signed)
Counseled re importance of smoking cessation but did not meet time criteria for separate billing           Each maintenance medication was reviewed in detail including emphasizing most importantly the difference between maintenance and prns and under what circumstances the prns are to be triggered using an action plan format where appropriate.  Total time for H and P, chart review, counseling, reviewing hfa device(s) and generating customized AVS unique to this office visit / same day charting = 25min

## 2020-11-16 NOTE — Progress Notes (Signed)
[name], female    DOB: 10/12/1945    MRN: [id number]   Brief patient profile:  78 yowf active smoker with new onset doe around 2014 dx as copd (though PFTs 03/03/12 did not confirm) and referred to pulmonary clinic in [facility]  11/19/2019 by Dr    Moshe Cipro for refractory cough and sob  History of Present Illness  11/19/2019  Pulmonary/ 1st office eval/ [name] / [place] on symbicort 160/atrovent Chief Complaint  Patient presents with   Consult    sinus congestion for about a 1-2 weeks  Dyspnea:  Goal mb and back 100 ft /  Variably sob shopping / not using HC parking  Cough: sporadic  Sleep: fine / some worse cough in am SABA use: not clear  rec Benazapril 40 mg  > stop this and replace with ibesartan 300 mg one daily in its place  Plan A = Automatic = Always=    symbicort 160 Take 2 puffs first thing in am and then another 2 puffs about 12 hours later.  Work on inhaler technique:   Plan B = Backup (to supplement plan A, not to replace it) Only use your albuterol inhaler as a rescue medication  Plan C = Crisis (instead of Plan B but only if Plan B stops working) - only use your albuterol nebulizer if you first try Plan B and it fails to help  The key is to stop smoking completely before smoking completely stops you! Please schedule a follow up office visit in 6 weeks, call sooner if needed with all medications     12/29/2019  f/u ov/[place] office/[name] re: doe/ brown mucus x one week already took medrol dose pack p televisit with pcp  Chief Complaint  Patient presents with   Follow-up    Still bothered by sinus congestion. She is c/o cough with brown sputum over the past wk. She is using the albuterol inhaler about 3 x per wk.   Asthmatic bronchitis, chronic (HCC) Active smoker  - PFT's  03/03/2012  FEV1 1.69 (69 % ) ratio 0.72  p 5 % improvement from saba p ? prior to study with DLCO  17.42 (68%) corrects to 4.53 (92%)  for alv volume and FV curve min  curvature/ ERV 37%   - try off acei 11/19/2019 and return for medication reconciliation in 6 weeks  Hypertension goal BP (blood pressure) < 130/80  Dyspnea:  Better though very inactive Cough:  X one week worse as above   SABA use: very poor insight into ABC plan, could not articulate any part of it even with prompting  02: none  rec Continue Irbesartan 300 mg one daily plus add  hydrodiuril (hydrochlorothiazide) 25 mg one daily  zpak should changes mucus brown to clear  The key is to stop smoking completely before smoking completely stops you! For cough > mucinex 1200 mg every 12 hours as needed (over the counter) Plan A = Automatic = Always=    symbicort 160 Take 2 puffs first thing in am and then another 2 puffs about 12 hours later atrovent is 2 puffs four times daily  Work on inhaler technique:  Plan B = Backup (to supplement plan A, not to replace it) Only use your albuterol inhaler as a rescue medication to be used if you can't catch your breath  Plan C = Crisis (instead of Plan B but only if Plan B stops working) - only use your albuterol nebulizer if you first  try Plan B and it fails to help > ok to use the nebulizer up to every 4 hours but if start needing it regularly call for immediate appointment Please schedule a follow up office visit in 6 weeks with pfts on return      04/04/2020   f/u ov/[place] office/[name]   Breathing is doing some better since the last visit. She is coughing less but still has prod cough with yellow to clear sputum.  Dyspnea:  mb and back better Cough: some am cough  Sleeping: bed is flat right down one pillow like a log  SABA use: 2 bid  02: none  Covid status: vax x 3  Rec The key is to stop smoking completely before smoking completely stops you! Only use your albuterol as a rescue medication  Work on inhaler technique:   Bring the empty symbicort container with you - remember the golfer and the practice swings    11/16/2020  f/u  ov/[place] office/[name] re: AB maint on symbicort 160  /confused with names of meds just finished doxy no better  Chief Complaint  Patient presents with   Follow-up    Cough with clear mucus, congestion, no fever or wheezing, sore when swallowing for a few weeks.     Dyspnea:  mb and back ok  Cough: more than usual, clear mucus / esp in am p wake up  Sleeping: flat bed 3-4 pillows  SABA use: once a week / rare neb  02: none  Covid status: vax x 3      No obvious day to day or daytime variability or assoc  purulent sputum or mucus plugs or hemoptysis or cp or chest tightness, subjective wheeze or overt sinus or hb symptoms.   Sleeping as above without nocturnal   exacerbation  of respiratory  c/o's or need for noct saba. Also denies any obvious fluctuation of symptoms with weather or environmental changes or other aggravating or alleviating factors except as outlined above   No unusual exposure hx or h/o childhood pna/ asthma or knowledge of premature birth.  Current Allergies, Complete Past Medical History, Past Surgical History, Family History, and Social History were reviewed in [employer] record.  ROS  The following are not active complaints unless bolded Hoarseness, sore throat, dysphagia, dental problems, itching, sneezing,  nasal congestion or discharge of excess mucus or purulent secretions, ear ache,   fever, chills, sweats, unintended wt loss or wt gain, classically pleuritic or exertional cp,  orthopnea pnd or arm/hand swelling  or leg swelling, presyncope, palpitations, abdominal pain, anorexia, nausea, vomiting, diarrhea  or change in bowel habits or change in bladder habits, change in stools or change in urine, dysuria, hematuria,  rash, arthralgias, visual complaints, headache, numbness, weakness or ataxia or problems with walking or coordination,  change in mood or  memory.        Current Meds - - NOTE:   Unable to verify as accurately reflecting  what pt takes      Medication Sig   acetaminophen (TYLENOL) 325 MG tablet Take 650 mg by mouth every 6 (six) hours as needed for moderate pain.    albuterol (PROAIR HFA) 108 (90 Base) MCG/ACT inhaler 2 puffs every 4 hours as needed only  if your can't catch your breath (Patient taking differently: Inhale 2 puffs into the lungs every 4 (four) hours as needed for wheezing or shortness of breath.)   albuterol (PROVENTIL) (2.5 MG/3ML) 0.083% nebulizer solution INHALE ONE VIAL  VIA NEBULIZER THREE TIMES DAILY. (Patient taking differently: Take 2.5 mg by nebulization 3 (three) times daily as needed for wheezing or shortness of breath.)   alendronate (FOSAMAX) 70 MG tablet Take 1 tablet (70 mg total) by mouth every 7 (seven) days. Take with a full glass of water on an empty stomach.   aspirin EC 81 MG tablet Take 81 mg by mouth daily.   ATROVENT HFA 17 MCG/ACT inhaler INHALE 2 PUFFS BY MOUTH FOUR TIMES A DAY.   benzonatate (TESSALON) 100 MG capsule Take 1 capsule (100 mg total) by mouth 2 (two) times daily as needed for cough.   benzonatate (TESSALON) 100 MG capsule Take 1 capsule (100 mg total) by mouth 2 (two) times daily as needed for cough.   Calcium Carbonate-Vitamin D (CALCIUM 600 + D PO) Take 1 tablet by mouth 2 (two) times daily.   Cyanocobalamin (VITAMIN B 12) 500 MCG TABS Take one tablet by mouth once daily   cyclobenzaprine (FLEXERIL) 5 MG tablet Take 1 tablet (5 mg total) by mouth at bedtime as needed.   diphenhydramine-acetaminophen (TYLENOL PM) 25-500 MG TABS tablet Take 1 tablet by mouth at bedtime as needed (pain).   doxycycline (VIBRA-TABS) 100 MG tablet Take 1 tablet (100 mg total) by mouth 2 (two) times daily.   Emollient (VASELINE INTENSIVE CARE EX) Apply 1 application topically daily.   famotidine (PEPCID) 20 MG tablet Take 20 mg by mouth at bedtime.    fluticasone (FLONASE) 50 MCG/ACT nasal spray USE 2 SPRAYS IN EACH NOSTRIL DAILY.   irbesartan (AVAPRO) 300 MG tablet Take 1 tablet  (300 mg total) by mouth daily.   loratadine (CLARITIN) 10 MG tablet TAKE 1 TABLET BY MOUTH ONCE DAILY FOR ALLERGIES. (Patient taking differently: Take 10 mg by mouth daily.)   Misc. Devices MISC Please provide patient with mastectomy bra and prosthesis. Dx: History of left breast cancer   montelukast (SINGULAIR) 10 MG tablet TAKE 1 TABLET BY MOUTH AT BEDTIME.   Naphazoline HCl (CLEAR EYES OP) Place 1 drop into both eyes daily as needed (irritation).   omeprazole (PRILOSEC) 20 MG capsule Take 20 mg by mouth daily as needed (acid reflux).   polyethylene glycol powder (GLYCOLAX/MIRALAX) powder MIX 1 CAPFUL IN 8 OUNCES OF JUICE OR WATER AND DRINK ONCE DAILY. (Patient taking differently: Take 17 g by mouth daily.)   potassium chloride SA (KLOR-CON) 20 MEQ tablet TAKE 1 TABLET BY MOUTH DAILY. (Patient taking differently: Take 20 mEq by mouth daily.)   promethazine-dextromethorphan (PROMETHAZINE-DM) 6.25-15 MG/5ML syrup 1 teaspoon at bedtime as needed for cough.   rosuvastatin (CRESTOR) 10 MG tablet Take 1 tablet (10 mg total) by mouth daily.   spironolactone (ALDACTONE) 25 MG tablet Take 1 tablet (25 mg total) by mouth daily.   SYMBICORT 160-4.5 MCG/ACT inhaler INHALE 2 PUFFS INTO THE LUNGS TWICE DAILY.   traMADol (ULTRAM) 50 MG tablet One tablet every eight hours as needed for pain.   UNABLE TO [place] to evaluate and tx as indicated for SN. Dx: G30.9                  Past Medical History:  Diagnosis Date   Breast cancer ([place]) 2007   Stage I (T1b N0 M0), grade 1 well-differentiated carcinoma of the left breast status, post lumpectomy followed by radiation therapy. Her estrogen receptor receptors were 93%, progesterone receptors 67%. HER-2/neu was negative. No lymphovascular space invasion was seen. All margins were clear. Ki-67 marker was low at 1%  with surgery on 11/15/2004. Treated then with post-lumpectomy radiation, finish   Breast cancer, left breast ([place]) 2007   COPD (chronic  obstructive pulmonary disease) ([place])    Diabetes mellitus without complication ([place])    Diverticula of colon    Hx of rickettsial disease    Hyperlipidemia    Hypertension    Kidney stones    Nicotine dependence    Osteoarthritis        Objective:     11/16/2020       174     04/04/2020       174  12/29/19 179 lb (81.2 kg)  12/04/19 177 lb (80.3 kg)  11/19/19 177 lb 3.2 oz (80.4 kg)     Vital signs reviewed  11/16/2020  - Note at rest 02 sats  99% on RA   General appearance:    amb hoarse wf / mild pseudowheeze      HEENT : pt wearing mask not removed for exam due to covid - 19 concerns.    NECK :  without JVD/Nodes/TM/ nl carotid upstrokes bilaterally   LUNGS: no acc muscle use,  Mild barrel  contour chest wall with bilateral  Distant bs s audible wheeze and  without cough on insp or exp maneuvers  and mild  Hyperresonant  to  percussion bilaterally     CV:  RRR  no s3 or murmur or increase in P2, and no edema   ABD:  soft and nontender with pos end  insp Hoover's  in the supine position. No bruits or organomegaly appreciated, bowel sounds nl  MS:   Nl gait/  ext warm without deformities, calf tenderness, cyanosis or clubbing No obvious joint restrictions   SKIN: warm and dry without lesions    NEURO:  alert, approp, nl sensorium with  no motor or cerebellar deficits apparent.         I personally reviewed images and agree with radiology impression as follows:  CXR:   11/16/20 PA and lat  No active cardiopulmonary disease.     Assessment

## 2020-11-17 ENCOUNTER — Other Ambulatory Visit: Payer: Self-pay | Admitting: Family Medicine

## 2020-11-17 ENCOUNTER — Other Ambulatory Visit: Payer: Self-pay | Admitting: Internal Medicine

## 2020-11-17 DIAGNOSIS — J3089 Other allergic rhinitis: Secondary | ICD-10-CM

## 2020-11-17 DIAGNOSIS — J449 Chronic obstructive pulmonary disease, unspecified: Secondary | ICD-10-CM

## 2020-11-17 DIAGNOSIS — R918 Other nonspecific abnormal finding of lung field: Secondary | ICD-10-CM

## 2020-11-22 ENCOUNTER — Other Ambulatory Visit: Payer: Self-pay

## 2020-11-22 DIAGNOSIS — R051 Acute cough: Secondary | ICD-10-CM

## 2020-11-24 ENCOUNTER — Ambulatory Visit (INDEPENDENT_AMBULATORY_CARE_PROVIDER_SITE_OTHER): Payer: 59 | Admitting: Family Medicine

## 2020-11-24 ENCOUNTER — Encounter: Payer: Self-pay | Admitting: Family Medicine

## 2020-11-24 ENCOUNTER — Other Ambulatory Visit: Payer: Self-pay

## 2020-11-24 VITALS — BP 159/76 | HR 79 | Temp 98.1°F | Resp 16 | Ht 65.0 in | Wt 171.0 lb

## 2020-11-24 DIAGNOSIS — Z0000 Encounter for general adult medical examination without abnormal findings: Secondary | ICD-10-CM

## 2020-11-24 DIAGNOSIS — Z1211 Encounter for screening for malignant neoplasm of colon: Secondary | ICD-10-CM

## 2020-11-24 DIAGNOSIS — Z23 Encounter for immunization: Secondary | ICD-10-CM | POA: Diagnosis not present

## 2020-11-24 DIAGNOSIS — J301 Allergic rhinitis due to pollen: Secondary | ICD-10-CM

## 2020-11-24 DIAGNOSIS — I10 Essential (primary) hypertension: Secondary | ICD-10-CM

## 2020-11-24 MED ORDER — DENOSUMAB 60 MG/ML ~~LOC~~ SOSY: 60.0000 mg | PREFILLED_SYRINGE | Freq: Once | SUBCUTANEOUS | 3 refills | Status: AC

## 2020-11-24 MED ORDER — AZELASTINE HCL 0.1 % NA SOLN: 2.0000 | Freq: Two times a day (BID) | NASAL | 12 refills | Status: DC

## 2020-11-24 MED ORDER — SPIRONOLACTONE 50 MG PO TABS: 50.0000 mg | ORAL_TABLET | Freq: Every day | ORAL | 2 refills | Status: DC

## 2020-11-24 NOTE — Progress Notes (Signed)
    [name]     MRN: [id number]      DOB: [birth year]  HPI: Patient is in for annual physical exam. Uncontrolled allergies and hypertension are also addressed Recent labs,  are reviewed. Immunization is reviewed , and  updated if needed.   PE: BP (!) 159/76   Pulse 79   Temp 98.1 F (36.7 C)   Resp 16   Ht 5\' 5"  (1.651 m)   Wt 171 lb (77.6 kg)   SpO2 95%   BMI 28.46 kg/m   Pleasant  female, alert and oriented x 3, in no cardio-pulmonary distress. Afebrile. HEENT No facial trauma or asymetry. Sinuses non tender.  Extra occullar muscles intact.. External ears normal, . Neck: supple, no adenopathy,JVD or thyromegaly.No bruits.  Chest: Decreased air entry, scattered crackles and few wheezes. Non tender to palpation  Breast: Normal  mammogram in 08/2020  Cardiovascular system; Heart sounds normal,  S1 and  S2 ,no S3.  No murmur, or thrill. Apical beat not displaced Peripheral pulses normal.  Abdomen: Soft, non tender, no organomegaly or masses. . No guarding, tenderness or rebound.      Musculoskeletal exam: Decreased ROM of spine, hips , shoulders and knees. No deformity ,swelling or crepitus noted. No muscle wasting or atrophy.   Neurologic: Cranial nerves 2 to 12 intact. Power, tone ,sensation and reflexes normal throughout. disturbance in gait. Positive  tremor.  Skin: Intact, no ulceration, erythema , scaling or rash noted. Pigmentation normal throughout  Psych; Normal mood and affect. Judgement and concentration normal   Assessment & Plan:   Allergic rhinitis Uncontrolled start daily astelin  Annual physical exam Annual exam as documented. Counseling done  re healthy lifestyle involving commitment to 150 minutes exercise per week, heart healthy diet, and attaining healthy weight.The importance of adequate sleep also discussed. Regular seat belt use and home safety, is also discussed. Changes in health habits are decided on by the  patient with goals and time frames  set for achieving them. Immunization and cancer screening needs are specifically addressed at this visit.   HTN (hypertension) Uncontrolled , dose of spironolactone is increased  DASH diet and commitment to daily physical activity for a minimum of 30 minutes discussed and encouraged, as a part of hypertension management. The importance of attaining a healthy weight is also discussed.  BP/Weight 11/24/2020 11/16/2020 08/25/2020 08/18/2020 07/12/2020 07/10/2020 8/75/7972  Systolic BP 820 601 561 537 943 276 147  Diastolic BP 76 78 78 70 67 81 80  Wt. (Lbs) 171 174.04 173.2 172 170 170 -  BMI 28.46 28.96 28.82 28.62 28.29 28.29 29.72

## 2020-11-24 NOTE — Patient Instructions (Signed)
F/u mid January re eval blood pressure call if you need me sooner  Flu vaccine, check and document normal temp before giving  New higher  dose, spironolactone 50 mg one daily for blood pressure, which is still high  Start using Astelin spray every day also for allergies  Recent blood work is excellent, still waiting on sputum culture  Continue to work on quitting smoking  Nurse please arrange Prolia to be given in the office for osteoporosis treatment for her, she has to stop fosamax  Thanks for choosing [facility], we consider it a privelige to serve you.

## 2020-11-25 LAB — SPUTUM CULTURE

## 2020-11-25 LAB — GRAM STAIN W/SPUTUM CULT RFLX: Epithelial Cells: NONE SEEN

## 2020-11-26 ENCOUNTER — Encounter: Payer: Self-pay | Admitting: Family Medicine

## 2020-11-26 NOTE — Assessment & Plan Note (Signed)
Uncontrolled start daily astelin

## 2020-11-26 NOTE — Assessment & Plan Note (Signed)

## 2020-11-26 NOTE — Assessment & Plan Note (Signed)
Uncontrolled , dose of spironolactone is increased  DASH diet and commitment to daily physical activity for a minimum of 30 minutes discussed and encouraged, as a part of hypertension management. The importance of attaining a healthy weight is also discussed.  BP/Weight 11/24/2020 11/16/2020 08/25/2020 08/18/2020 07/12/2020 07/10/2020 03/05/7586  Systolic BP 325 498 264 158 309 407 680  Diastolic BP 76 78 78 70 67 81 80  Wt. (Lbs) 171 174.04 173.2 172 170 170 -  BMI 28.46 28.96 28.82 28.62 28.29 28.29 29.72

## 2020-11-28 ENCOUNTER — Telehealth: Payer: Self-pay | Admitting: Family Medicine

## 2020-11-28 ENCOUNTER — Telehealth: Payer: Self-pay | Admitting: Orthopaedic Surgery

## 2020-11-28 NOTE — Telephone Encounter (Signed)
Pt called in about Prolia for osteoporosis  Pt has received the shots from pharm and is confused about them.   Pt needs some clarification

## 2020-11-29 ENCOUNTER — Other Ambulatory Visit: Payer: Self-pay

## 2020-11-29 ENCOUNTER — Ambulatory Visit (INDEPENDENT_AMBULATORY_CARE_PROVIDER_SITE_OTHER): Payer: 59 | Admitting: Orthopaedic Surgery

## 2020-11-29 ENCOUNTER — Encounter: Payer: Self-pay | Admitting: Orthopaedic Surgery

## 2020-11-29 VITALS — BP 173/95 | HR 93 | Ht 65.0 in | Wt 170.4 lb

## 2020-11-29 DIAGNOSIS — M25561 Pain in right knee: Secondary | ICD-10-CM | POA: Diagnosis not present

## 2020-11-29 DIAGNOSIS — F17210 Nicotine dependence, cigarettes, uncomplicated: Secondary | ICD-10-CM

## 2020-11-29 DIAGNOSIS — G8929 Other chronic pain: Secondary | ICD-10-CM | POA: Diagnosis not present

## 2020-11-29 MED ORDER — TRAMADOL HCL 50 MG PO TABS: ORAL_TABLET | ORAL | 2 refills | Status: DC

## 2020-11-29 NOTE — Telephone Encounter (Signed)
Called and left a message that we will give her the shot and it is every 6 months. She can come in today or call to schedule a time to come in

## 2020-11-29 NOTE — Progress Notes (Signed)
PROCEDURE NOTE:  The patient requests injections of the right knee , verbal consent was obtained.  The right knee was prepped appropriately after time out was performed.   Sterile technique was observed and injection of 1 cc of DepoMedrol 40mg  with several cc's of plain xylocaine. Anesthesia was provided by ethyl chloride and a 20-gauge needle was used to inject the knee area. The injection was tolerated well.  A band aid dressing was applied.  The patient was advised to apply ice later today and tomorrow to the injection sight as needed.   Encounter Diagnoses  Name Primary?   Chronic pain of right knee Yes   Cigarette nicotine dependence without complication    Return in three months.  I have reviewed the [place] web site prior to prescribing narcotic medicine for this patient.  Call if any problem.  Precautions discussed.  Electronically Signed Sanjuana Kava, MD 11/15/20222:00 PM

## 2020-12-02 ENCOUNTER — Encounter: Payer: Self-pay | Admitting: Nurse Practitioner

## 2020-12-02 ENCOUNTER — Ambulatory Visit (INDEPENDENT_AMBULATORY_CARE_PROVIDER_SITE_OTHER): Payer: 59 | Admitting: *Deleted

## 2020-12-02 ENCOUNTER — Other Ambulatory Visit: Payer: Self-pay

## 2020-12-02 ENCOUNTER — Ambulatory Visit (INDEPENDENT_AMBULATORY_CARE_PROVIDER_SITE_OTHER): Payer: 59 | Admitting: Nurse Practitioner

## 2020-12-02 VITALS — BP 155/72 | HR 110 | Wt 169.0 lb

## 2020-12-02 DIAGNOSIS — J101 Influenza due to other identified influenza virus with other respiratory manifestations: Secondary | ICD-10-CM | POA: Insufficient documentation

## 2020-12-02 DIAGNOSIS — M8580 Other specified disorders of bone density and structure, unspecified site: Secondary | ICD-10-CM

## 2020-12-02 DIAGNOSIS — Z20828 Contact with and (suspected) exposure to other viral communicable diseases: Secondary | ICD-10-CM

## 2020-12-02 DIAGNOSIS — I10 Essential (primary) hypertension: Secondary | ICD-10-CM

## 2020-12-02 LAB — POCT INFLUENZA A/B
Influenza A, POC: POSITIVE — AB
Influenza B, POC: NEGATIVE

## 2020-12-02 MED ORDER — DENOSUMAB 60 MG/ML ~~LOC~~ SOSY
60.0000 mg | PREFILLED_SYRINGE | Freq: Once | SUBCUTANEOUS | Status: AC
Start: 2020-12-02 — End: 2020-12-02
  Administered 2020-12-02: 60 mg via SUBCUTANEOUS

## 2020-12-02 MED ORDER — OSELTAMIVIR PHOSPHATE 75 MG PO CAPS: 75.0000 mg | ORAL_CAPSULE | Freq: Two times a day (BID) | ORAL | 0 refills | Status: AC

## 2020-12-02 MED ORDER — BENZONATATE 100 MG PO CAPS: 100.0000 mg | ORAL_CAPSULE | Freq: Two times a day (BID) | ORAL | 0 refills | Status: DC | PRN

## 2020-12-02 NOTE — Patient Instructions (Addendum)
Take tamiflu  times daily for 5 days ; Take tylenol as needed for fever.  Tessalon capsule two times daily as needed for cough. Please stay on isolation until you are free of fever for 24 hours. Follow up in one week if your symptoms does not get better.

## 2020-12-02 NOTE — Assessment & Plan Note (Signed)
BP Readings from Last 3 Encounters:  12/02/20 (!) 155/72  11/29/20 (!) 173/95  11/24/20 (!) 159/76  condition not controlled. Continue current med  Follow up at next visit.

## 2020-12-02 NOTE — Assessment & Plan Note (Signed)
Influenza A poistive. Tamiflu 750mg  BID for 5 days. Tessalon capsule 100mg  BID PRN for cough. Tylenol as needed for fever. Follow up in one week of symptom does not not improve or get worse.  Stay isolated until fever free for 24 hours.

## 2020-12-02 NOTE — Progress Notes (Signed)
   [name]     MRN: [id number]      DOB: 09/27/1945   HPI [name] is here for complaints of cough,chills and vomiting that started yesterday. Her grandson tested positive for flu 2 days ago. She has chronic wheezing uses albuterol inhaler  Denies HA, fever, CP,dizziness, Palpitation, stomach pain   ROS Has chills. Denies sinus pressure, nasal congestion, ear pain or sore throat. Has   cough and wheezing. Denies chest pains, palpitations and leg swelling Denies abdominal pain, nausea, vomiting,diarrhea or constipation.     PE  BP (!) 155/72 (BP Location: Right Arm, Patient Position: Sitting, Cuff Size: Large)   Pulse (!) 110   Wt 169 lb (76.7 kg)   SpO2 92%   BMI 28.12 kg/m   Patient alert and oriented and in no cardiopulmonary distress.  HEENT: No facial asymmetry, EOMI,     Neck supple .  Chest: Clear to auscultation bilaterally.  CVS: S1, S2 no murmurs, no S3.Regular rate.  ABD: Soft non tender.   Psych: Good eye contact, normal affect. Memory intact not anxious or depressed appearing.

## 2020-12-17 ENCOUNTER — Other Ambulatory Visit: Payer: Self-pay | Admitting: Family Medicine

## 2020-12-17 DIAGNOSIS — J3089 Other allergic rhinitis: Secondary | ICD-10-CM

## 2020-12-17 DIAGNOSIS — J449 Chronic obstructive pulmonary disease, unspecified: Secondary | ICD-10-CM

## 2020-12-17 DIAGNOSIS — R918 Other nonspecific abnormal finding of lung field: Secondary | ICD-10-CM

## 2020-12-30 ENCOUNTER — Encounter: Payer: Self-pay | Admitting: Internal Medicine

## 2020-12-30 ENCOUNTER — Other Ambulatory Visit: Payer: Self-pay

## 2020-12-30 ENCOUNTER — Ambulatory Visit (INDEPENDENT_AMBULATORY_CARE_PROVIDER_SITE_OTHER): Payer: 59 | Admitting: Internal Medicine

## 2020-12-30 DIAGNOSIS — J449 Chronic obstructive pulmonary disease, unspecified: Secondary | ICD-10-CM

## 2020-12-30 NOTE — Assessment & Plan Note (Signed)
Active smoker  - PFT's  03/03/2012  FEV1 1.69 (69 % ) ratio 0.72  p 5 % improvement from saba p ? prior to study with DLCO  17.42 (68%) corrects to 4.53 (92%)  for alv volume and FV curve min curvature/ ERV 37%   - try off acei 11/19/2019 and return for medication reconciliation in 6 weeks - 12/30/2020  After extensive coaching inhaler device,  effectiveness =    75% from a baseline of 50%  - advised on use of empty symbicort as a training device 12/30/2020 >>>   Rx: symbicort 160 2bid and prn saba  Re SABA :  I spent extra time with pt today reviewing appropriate use of albuterol for prn use on exertion with the following points: 1) saba is for relief of sob that does not improve by walking a slower pace or resting but rather if the pt does not improve after trying this first. 2) If the pt is convinced, as many are, that saba helps recover from activity faster then it's easy to tell if this is the case by re-challenging : ie stop, take the inhaler, then p 5 minutes try the exact same activity (intensity of workload) that just caused the symptoms and see if they are substantially diminished or not after saba 3) if there is an activity that reproducibly causes the symptoms, try the saba (hfa then neb then nothing)  15 min before the activity on alternate days   If in fact the saba really does help, then fine to continue to use it prn but advised may need to look closer at the maintenance regimen being used to achieve better control of airways disease with exertion.          Each maintenance medication was reviewed in detail including emphasizing most importantly the difference between maintenance and prns and under what circumstances the prns are to be triggered using an action plan format where appropriate.  Total time for H and P, chart review, counseling, reviewing hfa vs neb device(s) and generating customized AVS unique to this office visit / same day charting  > 30 min

## 2020-12-30 NOTE — Patient Instructions (Addendum)
For cough > robitussin dm  is fine     Plan A = Automatic = Always=   Symbicort  60 Take 2 puffs first thing in am and then another 2 puffs about 12 hours later.    Work on inhaler technique:  relax and gently blow all the way out then take a nice smooth full deep breath back in, triggering the inhaler at same time you start breathing in.  Hold for up to 5 seconds if you can. Blow out thru nose. Rinse and gargle with water when done.  If mouth or throat bother you at all,  try brushing teeth/gums/tongue with arm and hammer toothpaste/ make a slurry and gargle and spit out.   Keep the empty symbicort and practice like  a golfer each time before you use the one with medication in it   Plan B = Backup (to supplement plan A, not to replace it) Only use your albuterol inhaler as a rescue medication to be used if you can't catch your breath by resting or doing a relaxed purse lip breathing pattern.  - The less you use it, the better it will work when you need it. - Ok to use the inhaler up to 2 puffs  every 4 hours if you must but call for appointment if use goes up over your usual need - Don't leave home without it !!  (think of it like the spare tire for your car)   Plan C = Crisis (instead of Plan B but only if Plan B stops working) - only use your albuterol nebulizer if you first try Plan B and it fails to help > ok to use the nebulizer up to every 4 hours but if start needing it regularly call for immediate appointment   Ok to try albuterol 15 min before an activity (on alternating days inhaler vs nebulizer vs nothing )  that you know would usually make you short of breath and see if it makes any difference and if makes none then don't take albuterol after activity unless you can't catch your breath as this means it's the resting that helps, not the albuterol.   Please schedule a follow up visit in 6 months but call sooner if needed and bring all inhalers

## 2020-12-30 NOTE — Progress Notes (Signed)
[name], female    DOB: 01/09/1946    MRN: [id number]   Brief patient profile:  24   yowf active smoker with new onset doe around 2014 dx as copd (though PFTs 03/03/12 did not confirm) and referred to pulmonary clinic in [facility]  11/19/2019 by Dr    Moshe Cipro for refractory cough and sob  History of Present Illness  11/19/2019  Pulmonary/ 1st office eval/  / [place] on symbicort 160/atrovent Chief Complaint  Patient presents with   Consult    sinus congestion for about a 1-2 weeks  Dyspnea:  Goal mb and back 100 ft /  Variably sob shopping / not using HC parking  Cough: sporadic  Sleep: fine / some worse cough in am SABA use: not clear  rec Benazapril 40 mg  > stop this and replace with ibesartan 300 mg one daily in its place  Plan A = Automatic = Always=    symbicort 160 Take 2 puffs first thing in am and then another 2 puffs about 12 hours later.  Work on inhaler technique:   Plan B = Backup (to supplement plan A, not to replace it) Only use your albuterol inhaler as a rescue medication  Plan C = Crisis (instead of Plan B but only if Plan B stops working) - only use your albuterol nebulizer if you first try Plan B and it fails to help  The key is to stop smoking completely before smoking completely stops you! Please schedule a follow up office visit in 6 weeks, call sooner if needed with all medications     12/29/2019  f/u ov/[place] office/ re: doe/ brown mucus x one week already took medrol dose pack p televisit with pcp  Chief Complaint  Patient presents with   Follow-up    Still bothered by sinus congestion. She is c/o cough with brown sputum over the past wk. She is using the albuterol inhaler about 3 x per wk.   Asthmatic bronchitis, chronic (HCC) Active smoker  - PFT's  03/03/2012  FEV1 1.69 (69 % ) ratio 0.72  p 5 % improvement from saba p ? prior to study with DLCO  17.42 (68%) corrects to 4.53 (92%)  for alv volume and FV curve min  curvature/ ERV 37%   - try off acei 11/19/2019 and return for medication reconciliation in 6 weeks  Hypertension goal BP (blood pressure) < 130/80  Dyspnea:  Better though very inactive Cough:  X one week worse as above   SABA use: very poor insight into ABC plan, could not articulate any part of it even with prompting  02: none  rec Continue Irbesartan 300 mg one daily plus add  hydrodiuril (hydrochlorothiazide) 25 mg one daily  zpak should changes mucus brown to clear  The key is to stop smoking completely before smoking completely stops you! For cough > mucinex 1200 mg every 12 hours as needed (over the counter) Plan A = Automatic = Always=    symbicort 160 Take 2 puffs first thing in am and then another 2 puffs about 12 hours later atrovent is 2 puffs four times daily  Work on inhaler technique:  Plan B = Backup (to supplement plan A, not to replace it) Only use your albuterol inhaler as a rescue medication to be used if you can't catch your breath  Plan C = Crisis (instead of Plan B but only if Plan B stops working) - only use your albuterol nebulizer  if you first try Plan B and it fails to help > ok to use the nebulizer up to every 4 hours but if start needing it regularly call for immediate appointment Please schedule a follow up office visit in 6 weeks with pfts on return      04/04/2020   f/u ov/[place] office/[name]   Breathing is doing some better since the last visit. She is coughing less but still has prod cough with yellow to clear sputum.  Dyspnea:  mb and back better Cough: some am cough  Sleeping: bed is flat right down one pillow like a log  SABA use: 2 bid  02: none  Covid status: vax x 3  Rec The key is to stop smoking completely before smoking completely stops you! Only use your albuterol as a rescue medication  Work on inhaler technique:   Bring the empty symbicort container with you - remember the golfer and the practice swings    11/16/2020  f/u  ov/[place] office/[name] re: AB maint on symbicort 160  /confused with names of meds just finished doxy no better  Chief Complaint  Patient presents with   Follow-up    Cough with clear mucus, congestion, no fever or wheezing, sore when swallowing for a few weeks.     Dyspnea:  mb and back ok  Cough: more than usual, clear mucus / esp in am p wake up  Sleeping: flat bed 3-4 pillows  SABA use: once a week / rare neb  02: none  Covid status: vax x 3  Rec Stop fosamax (alendronate) and prilosec (omeprazole ) Pantoprazole (protonix) 40 mg   Take  30-60 min before first meal of the day and Pepcid (famotidine)  20 mg after supper until return to office  For cough > mucinex dm up to 1200 mg every 12 hours as needed Depomedrol 120 mg IM today  GERD diet reviewed, bed blocks rec  Plan A = Automatic = Always=   Symbicort 80 or 160 Take 2 puffs first thing in am and then another 2 puffs about 12 hours later.   Work on inhaler technique:  Keep the empty symbicort and practice like  a golfer Plan B = Backup (to supplement plan A, not to replace it) Only use your albuterol inhaler as a rescue medication Plan C = Crisis (instead of Plan B but only if Plan B stops working) - only use your albuterol nebulizer if you first try Plan B and it fails to help > ok to use the nebulizer up to every 4 hours but if start needing it regularly call for immediate appointment Please schedule a follow up office visit in 6 weeks, call sooner if needed with all medications /inhalers/ solutions in hand    12/30/2020  f/u ov/[place] office/[name] re: AB maint on symbicort 160 2bid  though hfa poor Chief Complaint  Patient presents with   Follow-up    Cough with clear mucus, congestion are about the same since last OV.   Dyspnea:  50 ft mailbox and slt uphill s  stopping Cough: improved, st gone, swallowing ok / min mucoid production Sleeping: flat bed planning on electric bed  SABA use: misunderstood how/when to  use saba  02: none  Covid status: vax "right many"      No obvious day to day or daytime variability or assoc purulent sputum or mucus plugs or hemoptysis or cp or chest tightness, subjective wheeze or overt sinus or hb symptoms.   Sleeping  as above  without nocturnal  or early am exacerbation  of respiratory  c/o's or need for noct saba. Also denies any obvious fluctuation of symptoms with weather or environmental changes or other aggravating or alleviating factors except as outlined above   No unusual exposure hx or h/o childhood pna/ asthma or knowledge of premature birth.  Current Allergies, Complete Past Medical History, Past Surgical History, Family History, and Social History were reviewed in [employer] record.  ROS  The following are not active complaints unless bolded Hoarseness, sore throat, dysphagia, dental problems, itching, sneezing,  nasal congestion or discharge of excess mucus or purulent secretions, ear ache,   fever, chills, sweats, unintended wt loss or wt gain, classically pleuritic or exertional cp,  orthopnea pnd or arm/hand swelling  or leg swelling, presyncope, palpitations, abdominal pain, anorexia, nausea, vomiting, diarrhea  or change in bowel habits or change in bladder habits, change in stools or change in urine, dysuria, hematuria,  rash, arthralgias, visual complaints, headache, numbness, weakness or ataxia or problems with walking or coordination,  change in mood or  memory.        Current Meds  Medication Sig   acetaminophen (TYLENOL) 325 MG tablet Take 650 mg by mouth every 6 (six) hours as needed for moderate pain.    albuterol (PROAIR HFA) 108 (90 Base) MCG/ACT inhaler 2 puffs every 4 hours as needed only  if your can't catch your breath (Patient taking differently: Inhale 2 puffs into the lungs every 4 (four) hours as needed for wheezing or shortness of breath.)   albuterol (PROVENTIL) (2.5 MG/3ML) 0.083% nebulizer solution INHALE  ONE VIAL VIA NEBULIZER THREE TIMES DAILY. (Patient taking differently: Take 2.5 mg by nebulization 3 (three) times daily as needed for wheezing or shortness of breath.)   aspirin EC 81 MG tablet Take 81 mg by mouth daily.   ATROVENT HFA 17 MCG/ACT inhaler INHALE 2 PUFFS BY MOUTH FOUR TIMES A DAY.   azelastine (ASTELIN) 0.1 % nasal spray Place 2 sprays into both nostrils 2 (two) times daily. Use in each nostril as directed   Calcium Carbonate-Vitamin D (CALCIUM 600 + D PO) Take 1 tablet by mouth 2 (two) times daily.   Cyanocobalamin (VITAMIN B 12) 500 MCG TABS Take one tablet by mouth once daily   cyclobenzaprine (FLEXERIL) 5 MG tablet TAKE ONE TABLET BY MOUTH AT BEDTIME AS NEEDED.   diphenhydramine-acetaminophen (TYLENOL PM) 25-500 MG TABS tablet Take 1 tablet by mouth at bedtime as needed (pain).   Emollient (VASELINE INTENSIVE CARE EX) Apply 1 application topically daily.   famotidine (PEPCID) 20 MG tablet Take 20 mg by mouth at bedtime.    fluticasone (FLONASE) 50 MCG/ACT nasal spray USE 2 SPRAYS IN EACH NOSTRIL DAILY.   irbesartan (AVAPRO) 300 MG tablet TAKE ONE TABLET BY MOUTH ONCE DAILY.   loratadine (CLARITIN) 10 MG tablet TAKE 1 TABLET BY MOUTH ONCE DAILY FOR ALLERGIES. (Patient taking differently: Take 10 mg by mouth daily.)   Misc. Devices MISC Please provide patient with mastectomy bra and prosthesis. Dx: History of left breast cancer   montelukast (SINGULAIR) 10 MG tablet TAKE 1 TABLET BY MOUTH AT BEDTIME.   Naphazoline HCl (CLEAR EYES OP) Place 1 drop into both eyes daily as needed (irritation).   pantoprazole (PROTONIX) 40 MG tablet Take 1 tablet (40 mg total) by mouth daily. Take 30-60 min before first meal of the day   polyethylene glycol powder (GLYCOLAX/MIRALAX) powder MIX 1 CAPFUL IN 8 OUNCES  OF JUICE OR WATER AND DRINK ONCE DAILY. (Patient taking differently: Take 17 g by mouth daily.)   potassium chloride SA (KLOR-CON) 20 MEQ tablet TAKE 1 TABLET BY MOUTH DAILY. (Patient taking  differently: Take 20 mEq by mouth daily.)   Pseudoephedrine-Guaifenesin (MUCINEX D MAX STRENGTH) [phone] MG TB12 Take by mouth.   rosuvastatin (CRESTOR) 10 MG tablet Take 1 tablet (10 mg total) by mouth daily.   spironolactone (ALDACTONE) 50 MG tablet Take 1 tablet (50 mg total) by mouth daily.   SYMBICORT 160-4.5 MCG/ACT inhaler INHALE 2 PUFFS INTO THE LUNGS TWICE DAILY.   traMADol (ULTRAM) 50 MG tablet One tablet every eight hours as needed for pain.   UNABLE TO [place] to evaluate and tx as indicated for SN. Dx: G30.9                   Past Medical History:  Diagnosis Date   Breast cancer ([place]) 2007   Stage I (T1b N0 M0), grade 1 well-differentiated carcinoma of the left breast status, post lumpectomy followed by radiation therapy. Her estrogen receptor receptors were 93%, progesterone receptors 67%. HER-2/neu was negative. No lymphovascular space invasion was seen. All margins were clear. Ki-67 marker was low at 1% with surgery on 11/15/2004. Treated then with post-lumpectomy radiation, finish   Breast cancer, left breast ([place]) 2007   COPD (chronic obstructive pulmonary disease) ([place])    Diabetes mellitus without complication ([place])    Diverticula of colon    Hx of rickettsial disease    Hyperlipidemia    Hypertension    Kidney stones    Nicotine dependence    Osteoarthritis        Objective:    Wts   12/30/2020     170 11/16/2020       174     04/04/2020       174  12/29/19 179 lb (81.2 kg)  12/04/19 177 lb (80.3 kg)  11/19/19 177 lb 3.2 oz (80.4 kg)    Vital signs reviewed  12/30/2020  - Note at rest 02 sats  97% on RA   General appearance:    amb wf easily confused with details of care     HEENT : pt wearing mask not removed for exam due to covid - 19 concerns.    NECK :  without JVD/Nodes/TM/ nl carotid upstrokes bilaterally   LUNGS: no acc muscle use,  Mild barrel  contour chest wall with bilateral  Distant bs s audible wheeze and  without cough  on insp or exp maneuvers  and mild  Hyperresonant  to  percussion bilaterally     CV:  RRR  no s3 or murmur or increase in P2, and no edema   ABD:  soft and nontender with pos end  insp Hoover's  in the supine position. No bruits or organomegaly appreciated, bowel sounds nl  MS:   Nl gait/  ext warm without deformities, calf tenderness, cyanosis or clubbing No obvious joint restrictions   SKIN: warm and dry without lesions    NEURO:  alert, approp, nl sensorium with  no motor or cerebellar deficits apparent.          Assessment

## 2021-01-12 ENCOUNTER — Other Ambulatory Visit: Payer: Self-pay | Admitting: Orthopaedic Surgery

## 2021-01-13 ENCOUNTER — Other Ambulatory Visit: Payer: Self-pay | Admitting: Family Medicine

## 2021-01-13 DIAGNOSIS — R918 Other nonspecific abnormal finding of lung field: Secondary | ICD-10-CM

## 2021-01-13 DIAGNOSIS — J4489 Other specified chronic obstructive pulmonary disease: Secondary | ICD-10-CM

## 2021-01-13 DIAGNOSIS — J449 Chronic obstructive pulmonary disease, unspecified: Secondary | ICD-10-CM

## 2021-01-13 DIAGNOSIS — J3089 Other allergic rhinitis: Secondary | ICD-10-CM

## 2021-01-20 ENCOUNTER — Other Ambulatory Visit: Payer: Self-pay | Admitting: Family Medicine

## 2021-01-20 DIAGNOSIS — R918 Other nonspecific abnormal finding of lung field: Secondary | ICD-10-CM

## 2021-01-20 DIAGNOSIS — J449 Chronic obstructive pulmonary disease, unspecified: Secondary | ICD-10-CM

## 2021-02-01 ENCOUNTER — Other Ambulatory Visit: Payer: Self-pay | Admitting: Internal Medicine

## 2021-02-01 ENCOUNTER — Encounter (INDEPENDENT_AMBULATORY_CARE_PROVIDER_SITE_OTHER): Payer: Self-pay

## 2021-02-01 ENCOUNTER — Encounter: Payer: Self-pay | Admitting: Family Medicine

## 2021-02-01 ENCOUNTER — Ambulatory Visit (INDEPENDENT_AMBULATORY_CARE_PROVIDER_SITE_OTHER): Payer: Commercial Managed Care - HMO | Admitting: Family Medicine

## 2021-02-01 ENCOUNTER — Other Ambulatory Visit: Payer: Self-pay

## 2021-02-01 VITALS — BP 135/72 | HR 90 | Ht 65.0 in | Wt 163.0 lb

## 2021-02-01 DIAGNOSIS — Z87891 Personal history of nicotine dependence: Secondary | ICD-10-CM | POA: Diagnosis not present

## 2021-02-01 DIAGNOSIS — I10 Essential (primary) hypertension: Secondary | ICD-10-CM

## 2021-02-01 DIAGNOSIS — E785 Hyperlipidemia, unspecified: Secondary | ICD-10-CM

## 2021-02-01 DIAGNOSIS — J449 Chronic obstructive pulmonary disease, unspecified: Secondary | ICD-10-CM

## 2021-02-01 NOTE — Progress Notes (Signed)
° °  [name]     MRN: [id number]      DOB: 09-13-1945   HPI [name] is here for follow up and re-evaluation of chronic medical conditions, medication management and review of any available recent lab and radiology data.  Preventive health is updated, specifically  Cancer screening and Immunization.   Questions or concerns regarding consultations or procedures which the PT has had in the interim are  addressed. The PT denies any adverse reactions to current medications since the last visit.  Face to  face visit for hospital bed. C/o back and right hip and knee pain difficulty lying flat   ROS Denies recent fever or chills. Denies sinus pressure, nasal congestion, ear pain or sore throat. Chronic chest congestion, wheeze and smoker's cough Denies chest pains, palpitations and leg swelling Denies abdominal pain, nausea, vomiting,diarrhea or constipation.   Denies dysuria, frequency, hesitancy or incontinence. Chronic  joint pain,  and limitation in mobility. Denies headaches, seizures, . Denies depression,uncontrolled  anxiety or insomnia. Denies skin break down or rash.   PE  BP 135/72    Pulse 90    Ht 5\' 5"  (1.651 m)    Wt 163 lb (73.9 kg)    SpO2 94%    BMI 27.12 kg/m   Patient alert and oriented and in no cardiopulmonary distress.  HEENT: No facial asymmetry, EOMI,     Neck adequate ROM .  Chest: decreased air entry few scattered wheezes  CVS: S1, S2 no murmurs, no S3.Regular rate.  ABD: Soft non tender.   Ext: No edema  MS: decreased  ROM spine, shoulders, hips and knees.  Skin: Intact, no ulcerations or rash noted.  Psych: Good eye contact, normal affect. Memory intact not anxious or depressed appearing.  CNS: CN 2-12 intact, power,  normal throughout.no focal deficits noted.   Assessment & Plan  Personal history of tobacco use, presenting hazards to health Asked:confirms currently smokes cigarettes Assess: Unwilling to set a quit date, but is cutting  back Advise: needs to QUIT to reduce risk of cancer, cardio and cerebrovascular disease Assist: counseled for 5 minutes and literature provided Arrange: follow up in 2 to 4 months Needs chest scan  Hyperlipidemia LDL goal <100 Hyperlipidemia:Low fat diet discussed and encouraged.   Lipid Panel  Lab Results  Component Value Date   CHOL 174 11/09/2020   HDL 66 11/09/2020   LDLCALC 84 11/09/2020   TRIG 138 11/09/2020   CHOLHDL 2.6 11/09/2020   Controlled, no change in medication Updated lab needed at/ before next visit.     HTN (hypertension) Controlled, no change in medication DASH diet and commitment to daily physical activity for a minimum of 30 minutes discussed and encouraged, as a part of hypertension management. The importance of attaining a healthy weight is also discussed.  BP/Weight 02/01/2021 12/30/2020 12/02/2020 11/29/2020 11/24/2020 11/16/2020 2/87/6811  Systolic BP 572 620 355 974 163 845 364  Diastolic BP 72 84 72 95 76 78 78  Wt. (Lbs) 163 170.6 169 170.4 171 174.04 173.2  BMI 27.12 28.39 28.12 28.36 28.46 28.96 28.82       GENERALIZED OSTEOARTHROSIS UNSPECIFIED SITE Pain management through Ortho, significant pain even when lying down  Asthmatic bronchitis , chronic (HCC) Currently stable and controlled on meds  COPD (chronic obstructive pulmonary disease) with chronic bronchitis (HCC) Progressively worsening due to ongoing nicotine use, counseled and encouraged to quit. Continue meds currently taking

## 2021-02-01 NOTE — Patient Instructions (Addendum)
F/U in 4 months, call if you need me before  Please continue to work on quitting smoking, now at 6 per day, cut down by 1 cigarette  each month  Please schedule screening chest scan at checkout  Thanks for choosing [facility], we consider it a privelige to serve you.

## 2021-02-05 ENCOUNTER — Encounter: Payer: Self-pay | Admitting: Family Medicine

## 2021-02-05 NOTE — Assessment & Plan Note (Signed)
Asked:confirms currently smokes cigarettes Assess: Unwilling to set a quit date, but is cutting back Advise: needs to QUIT to reduce risk of cancer, cardio and cerebrovascular disease Assist: counseled for 5 minutes and literature provided Arrange: follow up in 2 to 4 months Needs chest scan

## 2021-02-05 NOTE — Assessment & Plan Note (Signed)
Controlled, no change in medication DASH diet and commitment to daily physical activity for a minimum of 30 minutes discussed and encouraged, as a part of hypertension management. The importance of attaining a healthy weight is also discussed.  BP/Weight 02/01/2021 12/30/2020 12/02/2020 11/29/2020 11/24/2020 11/16/2020 8/67/6720  Systolic BP 947 096 283 662 947 654 650  Diastolic BP 72 84 72 95 76 78 78  Wt. (Lbs) 163 170.6 169 170.4 171 174.04 173.2  BMI 27.12 28.39 28.12 28.36 28.46 28.96 28.82

## 2021-02-05 NOTE — Assessment & Plan Note (Signed)
Currently stable and controlled on meds

## 2021-02-05 NOTE — Assessment & Plan Note (Addendum)
Pain management through Ortho, significant pain even when lying down

## 2021-02-05 NOTE — Assessment & Plan Note (Signed)
Progressively worsening due to ongoing nicotine use, counseled and encouraged to quit. Continue meds currently taking

## 2021-02-05 NOTE — Assessment & Plan Note (Signed)
Hyperlipidemia:Low fat diet discussed and encouraged.   Lipid Panel  Lab Results  Component Value Date   CHOL 174 11/09/2020   HDL 66 11/09/2020   LDLCALC 84 11/09/2020   TRIG 138 11/09/2020   CHOLHDL 2.6 11/09/2020   Controlled, no change in medication Updated lab needed at/ before next visit.

## 2021-02-14 ENCOUNTER — Ambulatory Visit (INDEPENDENT_AMBULATORY_CARE_PROVIDER_SITE_OTHER): Payer: Medicare Other

## 2021-02-14 ENCOUNTER — Other Ambulatory Visit: Payer: Self-pay

## 2021-02-14 DIAGNOSIS — Z0000 Encounter for general adult medical examination without abnormal findings: Secondary | ICD-10-CM | POA: Diagnosis not present

## 2021-02-14 DIAGNOSIS — Z122 Encounter for screening for malignant neoplasm of respiratory organs: Secondary | ICD-10-CM

## 2021-02-14 NOTE — Addendum Note (Signed)
Addended by: Argentina Ponder D on: 02/14/2021 03:21 PM   Modules accepted: Orders

## 2021-02-14 NOTE — Patient Instructions (Signed)

## 2021-02-14 NOTE — Progress Notes (Signed)
Subjective:   [name] is a 76 y.o. female who presents for Medicare Annual (Subsequent) preventive examination. I connected with  [name] on 02/14/21 by a audio enabled telemedicine application and verified that I am speaking with the correct person using two identifiers.  Patient Location: Home  Provider Location: Office/Clinic  I discussed the limitations of evaluation and management by telemedicine. The patient expressed understanding and agreed to proceed.  Review of Systems    Defer to PCP Cardiac Risk Factors include: advanced age (>25mn, >>65women);hypertension;smoking/ tobacco exposure;sedentary lifestyle     Objective:    Today's Vitals   02/14/21 1451  PainSc: 2    There is no height or weight on file to calculate BMI.  Advanced Directives 02/14/2021 08/25/2020 07/12/2020 07/10/2020 12/04/2019 10/16/2019 10/13/2019  Does Patient Have a Medical Advance Directive? No Yes No Yes Yes Yes Yes  Type of Advance Directive - [place] will [place] will [place] WaterLiving will Healthcare Power of [place]Living will Living will;Healthcare Power of Attorney  Does patient want to make changes to medical advance directive? - No - Patient declined - - - No - Patient declined No - Patient declined  Copy of [place] Chart? - No - copy requested No - copy requested No - copy requested No - copy requested No - copy requested No - copy requested  Would patient like information on creating a medical advance directive? No - Patient declined - - - No - Patient declined - -    Current Medications (verified) Outpatient Encounter Medications as of 02/14/2021  Medication Sig   acetaminophen (TYLENOL) 325 MG tablet Take 650 mg by mouth every 6 (six) hours as needed for moderate pain.    albuterol (PROAIR HFA) 108 (90 Base) MCG/ACT inhaler 2 puffs every 4 hours as  needed only  if your can't catch your breath (Patient taking differently: Inhale 2 puffs into the lungs every 4 (four) hours as needed for wheezing or shortness of breath.)   albuterol (PROVENTIL) (2.5 MG/3ML) 0.083% nebulizer solution INHALE ONE VIAL VIA NEBULIZER THREE TIMES DAILY. (Patient taking differently: Take 2.5 mg by nebulization 3 (three) times daily as needed for wheezing or shortness of breath.)   aspirin EC 81 MG tablet Take 81 mg by mouth daily.   ATROVENT HFA 17 MCG/ACT inhaler INHALE 2 PUFFS BY MOUTH FOUR TIMES A DAY.   azelastine (ASTELIN) 0.1 % nasal spray Place 2 sprays into both nostrils 2 (two) times daily. Use in each nostril as directed   Calcium Carbonate-Vitamin D (CALCIUM 600 + D PO) Take 1 tablet by mouth 2 (two) times daily.   Cyanocobalamin (VITAMIN B 12) 500 MCG TABS Take one tablet by mouth once daily   cyclobenzaprine (FLEXERIL) 5 MG tablet TAKE ONE TABLET BY MOUTH AT BEDTIME AS NEEDED.   diphenhydramine-acetaminophen (TYLENOL PM) 25-500 MG TABS tablet Take 1 tablet by mouth at bedtime as needed (pain).   Emollient (VASELINE INTENSIVE CARE EX) Apply 1 application topically daily.   famotidine (PEPCID) 20 MG tablet Take 20 mg by mouth at bedtime.    fluticasone (FLONASE) 50 MCG/ACT nasal spray USE 2 SPRAYS IN EACH NOSTRIL DAILY.   irbesartan (AVAPRO) 300 MG tablet TAKE ONE TABLET BY MOUTH ONCE DAILY.   loratadine (CLARITIN) 10 MG tablet TAKE 1 TABLET BY MOUTH ONCE DAILY FOR ALLERGIES. (Patient taking differently: Take 10 mg by mouth daily.)  Misc. Devices MISC Please provide patient with mastectomy bra and prosthesis. Dx: History of left breast cancer   montelukast (SINGULAIR) 10 MG tablet TAKE 1 TABLET BY MOUTH AT BEDTIME.   Naphazoline HCl (CLEAR EYES OP) Place 1 drop into both eyes daily as needed (irritation).   pantoprazole (PROTONIX) 40 MG tablet TAKE ONE TABLET BY MOUTH ONCE DAILY BEFORE BREAKFAST.   polyethylene glycol powder (GLYCOLAX/MIRALAX) powder MIX 1  CAPFUL IN 8 OUNCES OF JUICE OR WATER AND DRINK ONCE DAILY. (Patient taking differently: Take 17 g by mouth daily.)   potassium chloride SA (KLOR-CON) 20 MEQ tablet TAKE 1 TABLET BY MOUTH DAILY. (Patient taking differently: Take 20 mEq by mouth daily.)   rosuvastatin (CRESTOR) 10 MG tablet Take 1 tablet (10 mg total) by mouth daily.   spironolactone (ALDACTONE) 50 MG tablet Take 1 tablet (50 mg total) by mouth daily.   SYMBICORT 160-4.5 MCG/ACT inhaler INHALE 2 PUFFS INTO THE LUNGS TWICE DAILY.   traMADol (ULTRAM) 50 MG tablet One tablet every eight hours as needed for pain.   UNABLE TO [place] to evaluate and tx as indicated for SN. Dx: G30.9   [DISCONTINUED] Pseudoephedrine-Guaifenesin [id number] MG TB12 Take by mouth.   No facility-administered encounter medications on file as of 02/14/2021.    Allergies (verified) Penicillins and Sulfonamide derivatives   History: Past Medical History:  Diagnosis Date   Breast cancer ([place]) 2007   Stage I (T1b N0 M0), grade 1 well-differentiated carcinoma of the left breast status, post lumpectomy followed by radiation therapy. Her estrogen receptor receptors were 93%, progesterone receptors 67%. HER-2/neu was negative. No lymphovascular space invasion was seen. All margins were clear. Ki-67 marker was low at 1% with surgery on 11/15/2004. Treated then with post-lumpectomy radiation, finish   Breast cancer, left breast ([place]) 2007   COPD (chronic obstructive pulmonary disease) ([place])    Diabetes mellitus without complication ([place])    diet controlled   Diverticula of colon    Hx of rickettsial disease    Hyperlipidemia    Hypertension    Kidney stones    Nicotine dependence    Osteoarthritis    Past Surgical History:  Procedure Laterality Date   ABDOMINAL HYSTERECTOMY     BREAST SURGERY  2005 approx   left lumpectomy    CATARACT EXTRACTION W/PHACO Left 05/01/2019   Procedure: CATARACT EXTRACTION PHACO AND INTRAOCULAR LENS PLACEMENT LEFT EYE  CDE=11.73;  Surgeon: Baruch Goldmann, MD;  Location: [facility];  Service: Ophthalmology;  Laterality: Left;  left   CATARACT EXTRACTION W/PHACO Right 05/15/2019   Procedure: CATARACT EXTRACTION PHACO AND INTRAOCULAR LENS PLACEMENT RIGHT EYE;  Surgeon: Baruch Goldmann, MD;  Location: [facility];  Service: Ophthalmology;  Laterality: Right;  CDE: 16.07   CHOLECYSTECTOMY N/A 05/26/2014   Procedure: LAPAROSCOPIC CHOLECYSTECTOMY;  Surgeon: Aviva Signs Md, MD;  Location: [facility];  Service: General;  Laterality: N/A;   COLECTOMY     2005, diverticulitis   COLONOSCOPY N/A 05/20/2017    Surgeon: Danie Binder, MD;  five 3-6 mm polyps (4 tubular adenomas, 1 hyperplastic polyp), tortuous left colon, internal hemorrhoids.  Recommended repeat colonoscopy in 3 years.   COLONOSCOPY WITH PROPOFOL N/A 07/12/2020   Procedure: COLONOSCOPY WITH PROPOFOL;  Surgeon: Eloise Harman, DO;  Location: [facility];  Service: Endoscopy;  Laterality: N/A;  12:30pm   DILATION AND CURETTAGE OF UTERUS     ESOPHAGOGASTRODUODENOSCOPY N/A 05/20/2017   Surgeon: Danie Binder, MD; normal esophagus and mild gastritis due to  aspirin.  Biopsies negative for H. pylori.   left breast      cancer, in 2006   POLYPECTOMY  07/12/2020   Procedure: POLYPECTOMY;  Surgeon: Eloise Harman, DO;  Location: [facility];  Service: Endoscopy;;   TUBAL LIGATION     Family History  Problem Relation Age of Onset   Breast cancer Mother    COPD Father    Emphysema Father    Throat cancer Sister    Glaucoma Brother    Schizophrenia Brother    Heart attack Brother    Lung cancer Sister        former smoker   Hepatitis C Daughter    Seizures Daughter    SIDS Son    Colon cancer Neg Hx    Colon polyps Neg Hx    Social History   Socioeconomic History   Marital status: Single    Spouse name: Not on file   Number of children: 5   Years of education: 9th grade    Highest education level: 9th grade  Occupational History   Occupation: retired     Fish farm manager: RETIRED  Tobacco Use   Smoking status: Every Day    Packs/day: 0.50    Years: 50.00    Pack years: 25.00    Types: Cigarettes   Smokeless tobacco: Never   Tobacco comments:    smokes 1/2 pack per day 11/19/2019  Vaping Use   Vaping Use: Never used  Substance and Sexual Activity   Alcohol use: No    Alcohol/week: 0.0 standard drinks   Drug use: No   Sexual activity: Not Currently  Other Topics Concern   Not on file  Social History Narrative   ** Merged History Encounter **       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  MARRIED WITH A RUN AWAY HUSBAND(FUGITIVE 10 YRS). 3 MISCARRIAGES, 5 CHILDREN   Social Determinants of Health   Financial Resource Strain: Not on file  Food Insecurity: Not on file  Transportation Needs: Not on file  Physical Activity: Not on file  Stress: Not on file  Social Connections: Not on file    Tobacco Counseling Ready to quit: Not Answered Counseling given: Not Answered Tobacco comments: smokes 1/2 pack per day 11/19/2019   Clinical Intake:  Pre-visit preparation completed: No  Pain : 0-10 Pain Score: 2      Diabetes: No  How often do you need to have someone help you when you read instructions, pamphlets, or other written materials from your doctor or pharmacy?: 1 - Never What is the last grade level you completed in school?: 9th  Diabetic?no  Interpreter Needed?: No  Information entered by :: [place] of Daily Living In your present state of health, do you have any difficulty performing the following activities: 02/14/2021 07/08/2020  Hearing? N -  Vision? N -  Difficulty concentrating or making decisions? Y -  Walking or climbing stairs? Y -  Dressing or bathing? N -  Doing errands, shopping? N N  Preparing Food and eating ? Y -  Using the Toilet? N -  In the past six months, have you accidently leaked urine? N -  Do you have  problems with loss of bowel control? N -  Managing your Medications? N -  Managing your Finances? N -  Housekeeping or managing your Housekeeping? Y -  Some recent data might be hidden  Patient Care Team: [name], MD as PCP - General Branch, Alphonse Guild, MD as PCP - Cardiology (Cardiology) Sanjuana Kava, MD as Consulting Physician (Orthopedic Surgery) Whitney Muse, Kelby Fam, MD (Inactive) as Consulting Physician (Hematology and Oncology) Eloise Harman, DO as Consulting Physician (Internal Medicine)  Indicate any recent Medical Services you may have received from other than [facility] providers in the past year (date may be approximate).     Assessment:   This is a routine wellness examination for Tashyra.  Hearing/Vision screen No results found.  Dietary issues and exercise activities discussed: Current Exercise Habits: The patient does not participate in regular exercise at present, Exercise limited by: cardiac condition(s);respiratory conditions(s);Other - see comments (dementia)   Goals Addressed   None   Depression Screen PHQ 2/9 Scores 02/14/2021 02/01/2021 11/24/2020 09/30/2020 08/18/2020 08/05/2020 07/08/2020  PHQ - 2 Score 0 0 0 1 0 1 0  PHQ- 9 Score - - - - - - -    Fall Risk Fall Risk  02/14/2021 02/01/2021 11/24/2020 10/12/2020 09/30/2020  Falls in the past year? 0 0 0 0 0  Number falls in past yr: 0 0 0 0 0  Injury with Fall? 0 0 0 0 0  Risk for fall due to : Orthopedic patient No Fall Risks - - No Fall Risks  Follow up Falls evaluation completed Falls evaluation completed - - Falls evaluation completed    FALL RISK PREVENTION PERTAINING TO THE HOME:  Any stairs in or around the home? Yes  If so, are there any without handrails? Yes  Home free of loose throw rugs in walkways, pet beds, electrical cords, etc? Yes  Adequate lighting in your home to reduce risk of falls? Yes   ASSISTIVE DEVICES UTILIZED TO PREVENT FALLS:  Life alert? No  Use of a cane,  walker or w/c? No  Grab bars in the bathroom? Yes  Shower chair or bench in shower? Yes  Elevated toilet seat or a handicapped toilet? No      Cognitive Function:     6CIT Screen 02/14/2021 12/04/2019 12/03/2018 11/25/2017 11/12/2016  What Year? 0 points 0 points 0 points 0 points 0 points  What month? 0 points 0 points 0 points 0 points 0 points  What time? 0 points 0 points 0 points 0 points 0 points  Count back from 20 0 points 0 points 0 points 0 points 0 points  Months in reverse 2 points 0 points 2 points 2 points 0 points  Repeat phrase 0 points 0 points 4 points 0 points 0 points  Total Score 2 0 6 2 0    Immunizations Immunization History  Administered Date(s) Administered   Fluad Quad(high Dose 65+) 09/09/2018, 12/29/2019, 11/24/2020   Influenza Split 10/31/2011   Influenza Whole 11/18/2008, 10/12/2010   Influenza,inj,Quad PF,6+ Mos 11/12/2012, 09/18/2013, 12/22/2014, 10/04/2015, 09/09/2017   Moderna Sars-Covid-2 Vaccination 11/25/2020   PFIZER(Purple Top)SARS-COV-2 Vaccination 03/23/2019, 04/22/2019, 01/23/2020   PPD Test 01/28/2012   Pneumococcal Conjugate-13 01/04/2015   Pneumococcal Polysaccharide-23 06/13/2010   Tdap 01/28/2012   Zoster Recombinat (Shingrix) 11/04/2017, 01/24/2018    TDAP status: Up to date  Flu Vaccine status: Up to date  Pneumococcal vaccine status: Up to date  Covid-19 vaccine status: Information provided on how to obtain vaccines.   Qualifies for Shingles Vaccine? Yes   Zostavax completed Yes   Shingrix Completed?: No.    Education has been provided regarding the importance of this vaccine. Patient has been advised to call insurance company  to determine out of pocket expense if they have not yet received this vaccine. Advised may also receive vaccine at local pharmacy or Health Dept. Verbalized acceptance and understanding.  Screening Tests Health Maintenance  Topic Date Due   COVID-19 Vaccine (5 - Booster for Pfizer series)  01/20/2021   TETANUS/TDAP  01/27/2022   Pneumonia Vaccine 52+ Years old  Completed   INFLUENZA VACCINE  Completed   DEXA SCAN  Completed   Hepatitis C Screening  Completed   Zoster Vaccines- Shingrix  Completed   HPV VACCINES  Aged Out   COLONOSCOPY (Pts 45-44yr Insurance coverage will need to be confirmed)  [place]aintenance Due  Topic Date Due   COVID-19 Vaccine (5 - Booster for [place]) 01/20/2021    Colorectal cancer screening: Type of screening: Colonoscopy. Completed 07/12/2020. Repeat every 0 years  Mammogram status: No longer required due to age.  Bone Density status: Completed 04/18/2020. Results reflect: Bone density results: OSTEOPENIA. Repeat every 0 years.  Lung Cancer Screening: (Low Dose CT Chest recommended if Age 77-80years, 30 pack-year currently smoking OR have quit w/in 15years.) does qualify.   Lung Cancer Screening Referral: 02/14/2021  Additional Screening:  Hepatitis C Screening: does qualify; Completed 07/25/2009  Vision Screening: Recommended annual ophthalmology exams for early detection of glaucoma and other disorders of the eye. Is the patient up to date with their annual eye exam?  Yes  Who is the provider or what is the name of the office in which the patient attends annual eye exams? Dr WGeorgann HousekeeperIf pt is not established with a provider, would they like to be referred to a provider to establish care? No .   Dental Screening: Recommended annual dental exams for proper oral hygiene  Community Resource Referral / Chronic Care Management: CRR required this visit?  No   CCM required this visit?  No      Plan:     I have personally reviewed and noted the following in the patients chart:   Medical and social history Use of alcohol, tobacco or illicit drugs  Current medications and supplements including opioid prescriptions.  Functional ability and status Nutritional status Physical  activity Advanced directives List of other physicians Hospitalizations, surgeries, and ER visits in previous 12 months Vitals Screenings to include cognitive, depression, and falls Referrals and appointments  In addition, I have reviewed and discussed with patient certain preventive protocols, quality metrics, and best practice recommendations. A written personalized care plan for preventive services as well as general preventive health recommendations were provided to patient.     SEarline [place]  02/14/2021   Nurse Notes:  Ms. ESieg, Thank you for taking time to come for your Medicare Wellness Visit. I appreciate your ongoing commitment to your health goals. Please review the following plan we discussed and let me know if I can assist you in the future.   These are the goals we discussed:  Goals      Increase physical activity     Quit smoking / using tobacco     Starting 01/04/2016 patient would like to cut back to 4 cigarettes a day.        This is a list of the screening recommended for you and due dates:  Health Maintenance  Topic Date Due   COVID-19 Vaccine (5 - Booster for Pfizer series) 01/20/2021   Tetanus Vaccine  01/27/2022   Pneumonia Vaccine  Completed  Flu Shot  Completed   DEXA scan (bone density measurement)  Completed   Hepatitis C Screening: USPSTF Recommendation to screen - Ages 39-79 yo.  Completed   Zoster (Shingles) Vaccine  Completed   HPV Vaccine  Aged Out   Colon Cancer Screening  Discontinued

## 2021-02-16 ENCOUNTER — Other Ambulatory Visit: Payer: Self-pay | Admitting: Family Medicine

## 2021-02-16 DIAGNOSIS — J3089 Other allergic rhinitis: Secondary | ICD-10-CM

## 2021-02-16 DIAGNOSIS — J449 Chronic obstructive pulmonary disease, unspecified: Secondary | ICD-10-CM

## 2021-02-16 DIAGNOSIS — R918 Other nonspecific abnormal finding of lung field: Secondary | ICD-10-CM

## 2021-02-20 NOTE — Progress Notes (Signed)
Results reviewed with patient at 08/25/20 visit by Beckey Rutter NP

## 2021-02-21 ENCOUNTER — Other Ambulatory Visit (HOSPITAL_COMMUNITY): Payer: Self-pay

## 2021-02-21 DIAGNOSIS — Z122 Encounter for screening for malignant neoplasm of respiratory organs: Secondary | ICD-10-CM

## 2021-02-21 DIAGNOSIS — Z87891 Personal history of nicotine dependence: Secondary | ICD-10-CM

## 2021-02-21 NOTE — Progress Notes (Signed)
Reviewed at 8/91122 visit by Beckey Rutter, NP

## 2021-02-21 NOTE — Progress Notes (Signed)
Reviewed at 08/25/20 visit by Beckey Rutter, NP

## 2021-02-21 NOTE — Progress Notes (Signed)
LDCT order placed per protocol °

## 2021-02-23 ENCOUNTER — Other Ambulatory Visit: Payer: Self-pay | Admitting: Orthopedic Surgery

## 2021-02-28 ENCOUNTER — Ambulatory Visit (INDEPENDENT_AMBULATORY_CARE_PROVIDER_SITE_OTHER): Payer: Medicare Other | Admitting: Orthopaedic Surgery

## 2021-02-28 ENCOUNTER — Other Ambulatory Visit: Payer: Self-pay

## 2021-02-28 ENCOUNTER — Encounter: Payer: Self-pay | Admitting: Orthopaedic Surgery

## 2021-02-28 VITALS — BP 168/85 | HR 95 | Ht 65.0 in | Wt 171.6 lb

## 2021-02-28 DIAGNOSIS — F17210 Nicotine dependence, cigarettes, uncomplicated: Secondary | ICD-10-CM

## 2021-02-28 DIAGNOSIS — G8929 Other chronic pain: Secondary | ICD-10-CM | POA: Diagnosis not present

## 2021-02-28 DIAGNOSIS — M25561 Pain in right knee: Secondary | ICD-10-CM | POA: Diagnosis not present

## 2021-02-28 NOTE — Progress Notes (Signed)
PROCEDURE NOTE:  The patient requests injections of the right knee , verbal consent was obtained.  The right knee was prepped appropriately after time out was performed.   Sterile technique was observed and injection of 1 cc of DepoMedrol 40mg  with several cc's of plain xylocaine. Anesthesia was provided by ethyl chloride and a 20-gauge needle was used to inject the knee area. The injection was tolerated well.  A band aid dressing was applied.  The patient was advised to apply ice later today and tomorrow to the injection sight as needed.  Encounter Diagnoses  Name Primary?   Chronic pain of right knee Yes   Cigarette nicotine dependence without complication    Return in three months.  Call if any problem.  Precautions discussed.  Electronically Signed Sanjuana Kava, MD 2/14/20232:12 PM

## 2021-03-08 ENCOUNTER — Telehealth: Payer: Self-pay

## 2021-03-08 NOTE — Telephone Encounter (Signed)
FYI Let Dr Moshe Cipro know she did not mean to give her her daughter information. Patient said she only has 3 polyps instead of 7.

## 2021-03-08 NOTE — Telephone Encounter (Signed)
FYI

## 2021-03-20 ENCOUNTER — Other Ambulatory Visit: Payer: Self-pay | Admitting: Family Medicine

## 2021-03-20 DIAGNOSIS — J3089 Other allergic rhinitis: Secondary | ICD-10-CM

## 2021-03-20 DIAGNOSIS — J4489 Other specified chronic obstructive pulmonary disease: Secondary | ICD-10-CM

## 2021-03-20 DIAGNOSIS — R918 Other nonspecific abnormal finding of lung field: Secondary | ICD-10-CM

## 2021-03-20 DIAGNOSIS — J449 Chronic obstructive pulmonary disease, unspecified: Secondary | ICD-10-CM

## 2021-03-21 ENCOUNTER — Other Ambulatory Visit: Payer: Self-pay

## 2021-03-21 ENCOUNTER — Ambulatory Visit (HOSPITAL_COMMUNITY)
Admission: RE | Admit: 2021-03-21 | Discharge: 2021-03-21 | Disposition: A | Discharge status: Home / Self Care | Payer: Medicare Other | Source: Ambulatory Visit | Attending: Family Medicine | Admitting: Family Medicine

## 2021-03-21 DIAGNOSIS — I700 Atherosclerosis of aorta: Secondary | ICD-10-CM | POA: Diagnosis not present

## 2021-03-21 DIAGNOSIS — Z122 Encounter for screening for malignant neoplasm of respiratory organs: Secondary | ICD-10-CM | POA: Diagnosis not present

## 2021-03-21 DIAGNOSIS — F17210 Nicotine dependence, cigarettes, uncomplicated: Secondary | ICD-10-CM | POA: Insufficient documentation

## 2021-03-21 DIAGNOSIS — J432 Centrilobular emphysema: Secondary | ICD-10-CM | POA: Insufficient documentation

## 2021-03-21 DIAGNOSIS — I2510 Atherosclerotic heart disease of native coronary artery without angina pectoris: Secondary | ICD-10-CM | POA: Diagnosis not present

## 2021-03-23 DIAGNOSIS — H02834 Dermatochalasis of left upper eyelid: Secondary | ICD-10-CM | POA: Diagnosis not present

## 2021-03-23 DIAGNOSIS — H02831 Dermatochalasis of right upper eyelid: Secondary | ICD-10-CM | POA: Diagnosis not present

## 2021-03-23 DIAGNOSIS — H11041 Peripheral pterygium, stationary, right eye: Secondary | ICD-10-CM | POA: Diagnosis not present

## 2021-03-23 DIAGNOSIS — H52223 Regular astigmatism, bilateral: Secondary | ICD-10-CM | POA: Diagnosis not present

## 2021-03-23 DIAGNOSIS — Z961 Presence of intraocular lens: Secondary | ICD-10-CM | POA: Diagnosis not present

## 2021-04-04 ENCOUNTER — Telehealth: Payer: Self-pay | Admitting: Orthopedic Surgery

## 2021-04-06 ENCOUNTER — Other Ambulatory Visit: Payer: Self-pay | Admitting: Family Medicine

## 2021-04-06 DIAGNOSIS — R918 Other nonspecific abnormal finding of lung field: Secondary | ICD-10-CM

## 2021-04-06 DIAGNOSIS — J449 Chronic obstructive pulmonary disease, unspecified: Secondary | ICD-10-CM

## 2021-04-07 DIAGNOSIS — J449 Chronic obstructive pulmonary disease, unspecified: Secondary | ICD-10-CM | POA: Diagnosis not present

## 2021-04-18 ENCOUNTER — Other Ambulatory Visit: Payer: Self-pay | Admitting: Family Medicine

## 2021-04-18 DIAGNOSIS — J3089 Other allergic rhinitis: Secondary | ICD-10-CM

## 2021-04-18 DIAGNOSIS — R918 Other nonspecific abnormal finding of lung field: Secondary | ICD-10-CM

## 2021-04-18 DIAGNOSIS — J449 Chronic obstructive pulmonary disease, unspecified: Secondary | ICD-10-CM

## 2021-05-02 ENCOUNTER — Telehealth: Payer: Self-pay

## 2021-05-02 MED ORDER — CYCLOBENZAPRINE HCL 5 MG PO TABS: 5.0000 mg | ORAL_TABLET | Freq: Every evening | ORAL | 0 refills | Status: DC | PRN

## 2021-05-02 NOTE — Telephone Encounter (Signed)
Patient is asking for refill on  ? ?Flexeril 5 MG  Qty 30 Tablets ? ? ?Also asking for  ? ?Tramadol  ? ?PATIENT USES [facility] ?

## 2021-05-05 ENCOUNTER — Telehealth: Payer: Self-pay | Admitting: Orthopaedic Surgery

## 2021-05-05 MED ORDER — TRAMADOL HCL 50 MG PO TABS: ORAL_TABLET | ORAL | 2 refills | Status: DC

## 2021-05-05 NOTE — Telephone Encounter (Signed)
Patient called in on 05/02/21 requesting a refill for her.  ? ?traMADol (ULTRAM) 50 MG tablet ? ?The pharmacy said they didn't get the script called in  ? ?Pharmacy: [facility]  ?

## 2021-05-08 DIAGNOSIS — J449 Chronic obstructive pulmonary disease, unspecified: Secondary | ICD-10-CM | POA: Diagnosis not present

## 2021-05-11 DIAGNOSIS — M159 Polyosteoarthritis, unspecified: Secondary | ICD-10-CM | POA: Diagnosis not present

## 2021-05-11 DIAGNOSIS — E119 Type 2 diabetes mellitus without complications: Secondary | ICD-10-CM | POA: Diagnosis not present

## 2021-05-11 DIAGNOSIS — J449 Chronic obstructive pulmonary disease, unspecified: Secondary | ICD-10-CM | POA: Diagnosis not present

## 2021-05-12 ENCOUNTER — Other Ambulatory Visit: Payer: Self-pay | Admitting: Internal Medicine

## 2021-05-12 DIAGNOSIS — J449 Chronic obstructive pulmonary disease, unspecified: Secondary | ICD-10-CM

## 2021-05-17 ENCOUNTER — Other Ambulatory Visit: Payer: Self-pay | Admitting: Family Medicine

## 2021-05-17 DIAGNOSIS — J449 Chronic obstructive pulmonary disease, unspecified: Secondary | ICD-10-CM

## 2021-05-17 DIAGNOSIS — R918 Other nonspecific abnormal finding of lung field: Secondary | ICD-10-CM

## 2021-05-18 ENCOUNTER — Ambulatory Visit (INDEPENDENT_AMBULATORY_CARE_PROVIDER_SITE_OTHER): Payer: Medicare Other | Admitting: Family Medicine

## 2021-05-18 ENCOUNTER — Encounter: Payer: Self-pay | Admitting: Family Medicine

## 2021-05-18 ENCOUNTER — Other Ambulatory Visit: Payer: Self-pay | Admitting: Family Medicine

## 2021-05-18 DIAGNOSIS — R350 Frequency of micturition: Secondary | ICD-10-CM

## 2021-05-18 DIAGNOSIS — J449 Chronic obstructive pulmonary disease, unspecified: Secondary | ICD-10-CM

## 2021-05-18 DIAGNOSIS — R300 Dysuria: Secondary | ICD-10-CM

## 2021-05-18 DIAGNOSIS — J3089 Other allergic rhinitis: Secondary | ICD-10-CM

## 2021-05-18 DIAGNOSIS — R3589 Other polyuria: Secondary | ICD-10-CM | POA: Diagnosis not present

## 2021-05-18 DIAGNOSIS — R918 Other nonspecific abnormal finding of lung field: Secondary | ICD-10-CM

## 2021-05-18 LAB — POCT URINALYSIS DIP (CLINITEK)
Bilirubin, UA: NEGATIVE
Blood, UA: NEGATIVE
Glucose, UA: NEGATIVE mg/dL
Ketones, POC UA: NEGATIVE mg/dL
Nitrite, UA: NEGATIVE
POC PROTEIN,UA: 30 — AB
Spec Grav, UA: 1.015 (ref 1.010–1.025)
Urobilinogen, UA: 0.2 E.U./dL
pH, UA: 6 (ref 5.0–8.0)

## 2021-05-18 MED ORDER — BENZONATATE 100 MG PO CAPS: 100.0000 mg | ORAL_CAPSULE | Freq: Two times a day (BID) | ORAL | 0 refills | Status: DC | PRN

## 2021-05-18 MED ORDER — NITROFURANTOIN MONOHYD MACRO 100 MG PO CAPS: 100.0000 mg | ORAL_CAPSULE | Freq: Two times a day (BID) | ORAL | 0 refills | Status: DC

## 2021-05-18 NOTE — Progress Notes (Signed)
[place] ?Virtual Visit via Telephone Note ? ?I connected with [name] on 05/18/21 at  1:00 PM EDT by telephone and verified that I am speaking with the correct person using two identifiers. ? ?Location: ?Patient: home ?Provider: office ?  ?I discussed the limitations, risks, security and privacy concerns of performing an evaluation and management service by telephone and the availability of in person appointments. I also discussed with the patient that there may be a patient responsible charge related to this service. The patient expressed understanding and agreed to proceed. ? ? ?History of Present Illness: ?5 days ago stomach pain feeling like UTI  ?Having urinary frequency , having chills , no documented fever, no n/v ? Both ears sore and throat is sore, cough, sometimes sputum clear and dark x 5 days ?States tick was crawling on her arm this past weekend no ?  ?Observations/Objective: ?There were no vitals taken for this visit. ?Good communication with no confusion and intact memory. ?Alert and oriented x 3 ?No signs of respiratory distress during speech ? ? ?Assessment and Plan: ?Acute cystitis ?CCUA today , c/s if abnormal ? ?COPD (chronic obstructive pulmonary disease) with chronic bronchitis ([place]) ?5 day h/o flare, tessalon perle  And antivbiotic , waiting on CCUA to determine antibiotic ? ?Dysuria ?4 day history,abn CCUA , send for c/s and nitrofurantoin prescribed ? ?Urinary frequency ?Symptomatic with abn CCUA, f/u c/s ? ? ?Follow Up Instructions: ? ?  ?I discussed the assessment and treatment plan with the patient. The patient was provided an opportunity to ask questions and all were answered. The patient agreed with the plan and demonstrated an understanding of the instructions. ?  ?The patient was advised to call back or seek an in-person evaluation if the symptoms worsen or if the condition fails to improve as anticipated. ? ?I provided 13 minutes of non-face-to-face time during this  encounter. ? ? ?[name], MD ? ?

## 2021-05-18 NOTE — Assessment & Plan Note (Signed)
CCUA today , c/s if abnormal ?

## 2021-05-18 NOTE — Assessment & Plan Note (Signed)
4 day history,abn CCUA , send for c/s and nitrofurantoin prescribed ?

## 2021-05-18 NOTE — Assessment & Plan Note (Signed)
Symptomatic with abn CCUA, f/u c/s ?

## 2021-05-18 NOTE — Assessment & Plan Note (Signed)
5 day h/o flare, tessalon perle  And antivbiotic , waiting on CCUA to determine antibiotic ?

## 2021-05-18 NOTE — Patient Instructions (Signed)
F/U as before, call if you ned me sooner ? ?Please come this afternoon by 2:30 for urine to be tested for infection ? ?Tessalon perles are prescribed for cough and antibiotic to be prescribed will be determined by CCUA report ? ?Please commit to stopping smoking ? ?Thanks for choosing [facility], we consider it a privelige to serve you. ? ?

## 2021-05-21 LAB — URINE CULTURE

## 2021-05-23 ENCOUNTER — Encounter: Payer: Self-pay | Admitting: Family Medicine

## 2021-05-23 ENCOUNTER — Ambulatory Visit (INDEPENDENT_AMBULATORY_CARE_PROVIDER_SITE_OTHER): Payer: Medicare Other | Admitting: Orthopaedic Surgery

## 2021-05-23 ENCOUNTER — Encounter: Payer: Self-pay | Admitting: Orthopaedic Surgery

## 2021-05-23 DIAGNOSIS — F17210 Nicotine dependence, cigarettes, uncomplicated: Secondary | ICD-10-CM

## 2021-05-23 DIAGNOSIS — G8929 Other chronic pain: Secondary | ICD-10-CM

## 2021-05-23 DIAGNOSIS — M25561 Pain in right knee: Secondary | ICD-10-CM

## 2021-05-23 NOTE — Progress Notes (Signed)
PROCEDURE NOTE: ? ?The patient requests injections of the right knee , verbal consent was obtained. ? ?The right knee was prepped appropriately after time out was performed.  ? ?Sterile technique was observed and injection of 1 cc of DepoMedrol '40mg'$  with several cc's of plain xylocaine. Anesthesia was provided by ethyl chloride and a 20-gauge needle was used to inject the knee area. The injection was tolerated well.  A band aid dressing was applied. ? ?The patient was advised to apply ice later today and tomorrow to the injection sight as needed. ? ?Encounter Diagnoses  ?Name Primary?  ? Chronic pain of right knee Yes  ? Cigarette nicotine dependence without complication   ? ?Return in three months. ? ?Call if any problem. ? ?Precautions discussed. ? ?Electronically Signed ?Sanjuana Kava, MD ?5/9/20231:43 PM ? ?

## 2021-05-30 ENCOUNTER — Ambulatory Visit: Payer: Medicare Other | Admitting: Orthopaedic Surgery

## 2021-06-01 ENCOUNTER — Encounter: Payer: Self-pay | Admitting: Family Medicine

## 2021-06-01 ENCOUNTER — Ambulatory Visit (INDEPENDENT_AMBULATORY_CARE_PROVIDER_SITE_OTHER): Payer: Medicare Other | Admitting: Family Medicine

## 2021-06-01 VITALS — BP 133/74 | HR 91 | Resp 16 | Ht 63.75 in | Wt 169.1 lb

## 2021-06-01 DIAGNOSIS — E559 Vitamin D deficiency, unspecified: Secondary | ICD-10-CM | POA: Diagnosis not present

## 2021-06-01 DIAGNOSIS — I10 Essential (primary) hypertension: Secondary | ICD-10-CM

## 2021-06-01 DIAGNOSIS — R7303 Prediabetes: Secondary | ICD-10-CM | POA: Diagnosis not present

## 2021-06-01 DIAGNOSIS — R7301 Impaired fasting glucose: Secondary | ICD-10-CM | POA: Diagnosis not present

## 2021-06-01 DIAGNOSIS — E785 Hyperlipidemia, unspecified: Secondary | ICD-10-CM | POA: Diagnosis not present

## 2021-06-01 DIAGNOSIS — Z1231 Encounter for screening mammogram for malignant neoplasm of breast: Secondary | ICD-10-CM | POA: Diagnosis not present

## 2021-06-01 DIAGNOSIS — J301 Allergic rhinitis due to pollen: Secondary | ICD-10-CM

## 2021-06-01 DIAGNOSIS — L989 Disorder of the skin and subcutaneous tissue, unspecified: Secondary | ICD-10-CM

## 2021-06-01 DIAGNOSIS — F17210 Nicotine dependence, cigarettes, uncomplicated: Secondary | ICD-10-CM

## 2021-06-01 DIAGNOSIS — J449 Chronic obstructive pulmonary disease, unspecified: Secondary | ICD-10-CM

## 2021-06-01 MED ORDER — AZELASTINE HCL 0.1 % NA SOLN: 2.0000 | Freq: Two times a day (BID) | NASAL | 12 refills | Status: DC

## 2021-06-01 NOTE — Assessment & Plan Note (Addendum)
2 month h/o itchy pruritic lesion on left forearm, won't heal, scabby, also has lesions on face she was told were cancerous, need derm eval

## 2021-06-01 NOTE — Patient Instructions (Addendum)
Annual exam 11/25/2021 or after please schedule  CBC, fasting lipid, cmp and EGFr, HBA1C , TSH and vit D next week ( OK to drink black coffee) in next 1 week  Please schedule mammogram at checkout  Please continue tio cut down cigarettes with a plan  to quitting by the end of the year  Please no more falls,    Thanks for choosing [facility], we consider it a privelige to serve you.

## 2021-06-05 ENCOUNTER — Encounter: Payer: Self-pay | Admitting: Family Medicine

## 2021-06-05 DIAGNOSIS — R7301 Impaired fasting glucose: Secondary | ICD-10-CM | POA: Diagnosis not present

## 2021-06-05 DIAGNOSIS — E559 Vitamin D deficiency, unspecified: Secondary | ICD-10-CM | POA: Diagnosis not present

## 2021-06-05 DIAGNOSIS — I10 Essential (primary) hypertension: Secondary | ICD-10-CM | POA: Diagnosis not present

## 2021-06-05 DIAGNOSIS — E785 Hyperlipidemia, unspecified: Secondary | ICD-10-CM | POA: Diagnosis not present

## 2021-06-06 ENCOUNTER — Encounter: Payer: Self-pay | Admitting: Family Medicine

## 2021-06-06 ENCOUNTER — Telehealth: Payer: Self-pay

## 2021-06-06 NOTE — Telephone Encounter (Signed)
Patient called left voicemail returning lab result call. Call back # [phone]

## 2021-06-06 NOTE — Assessment & Plan Note (Signed)
Asked:confirms currently smokes cigarettes °Assess: Unwilling to set a quit date, but is cutting back °Advise: needs to QUIT to reduce risk of cancer, cardio and cerebrovascular disease °Assist: counseled for 5 minutes and literature provided °Arrange: follow up in 2 to 4 months ° °

## 2021-06-06 NOTE — Assessment & Plan Note (Signed)
Patient educated about the importance of limiting  Carbohydrate intake , the need to commit to daily physical activity for a minimum of 30 minutes , and to commit weight loss. The fact that changes in all these areas will reduce or eliminate all together the development of diabetes is stressed.      Latest Ref Rng & Units 06/05/2021    8:07 AM 11/09/2020    9:20 AM 07/10/2020   10:25 AM 07/01/2020   11:43 AM 06/02/2020    1:07 PM  Diabetic Labs  HbA1c 4.8 - 5.6 % 8.3   5.9     6.2    Chol 100 - 199 mg/dL 168   174     246    HDL >39 mg/dL 67   66     73    Calc LDL 0 - 99 mg/dL 79   84     147    Triglycerides 0 - 149 mg/dL 128   138     147    Creatinine 0.57 - 1.00 mg/dL 1.00   0.93   0.70   0.90         06/01/2021    1:53 PM 02/28/2021    1:48 PM 02/01/2021   10:48 AM 12/30/2020   11:20 AM 12/02/2020    8:56 AM 11/29/2020    1:49 PM 11/24/2020    2:10 PM  BP/Weight  Systolic BP 704 888 916 945 038 882 800  Diastolic BP 74 85 72 84 72 95 76  Wt. (Lbs) 169.12 171.6 163 170.6 169 170.4 171  BMI 29.26 kg/m2 28.56 kg/m2 27.12 kg/m2 28.39 kg/m2 28.12 kg/m2 28.36 kg/m2 28.46 kg/m2      Latest Ref Rng & Units 09/07/2014   12:00 AM 06/10/2013    2:00 PM  Foot/eye exam completion dates  Eye Exam No Retinopathy No Retinopathy        Foot Form Completion   Done     This result is from an external source.    Updated lab needed at/ before next visit.

## 2021-06-06 NOTE — Progress Notes (Signed)
[name]     MRN: [id number]      DOB: 06/27/1945   HPI [name] is here for follow up and re-evaluation of chronic medical conditions, medication management and review of any available recent lab and radiology data.  Preventive health is updated, specifically  Cancer screening and Immunization.   Questions or concerns regarding consultations or procedures which the PT has had in the interim are  addressed. The PT denies any adverse reactions to current medications since the last visit.  C/o scab on left forearm for months which itches and will not heal ROS Denies recent fever or chills. Denies sinus pressure, nasal congestion, ear pain or sore throat. Denies chest congestion, productive cough or wheezing. Denies chest pains, palpitations and leg swelling Denies abdominal pain, nausea, vomiting,diarrhea or constipation.   Denies dysuria, frequency, hesitancy or incontinence. Denies joint pain, swelling and limitation in mobility. Denies headaches, seizures, numbness, or tingling. Denies depression, anxiety or insomnia. Denies skin break down or rash.   PE  BP 133/74   Pulse 91   Resp 16   Ht 5' 3.75" (1.619 m)   Wt 169 lb 1.9 oz (76.7 kg)   SpO2 93%   BMI 29.26 kg/m   Patient alert and oriented and in no cardiopulmonary distress.  HEENT: No facial asymmetry, EOMI,     Neck supple .  Chest: decreased air entry, scattered crackles , few wheezes CVS: S1, S2 no murmurs, no S3.Regular rate.  ABD: Soft non tender.   Ext: No edema  MS: decreased  ROM spine, shoulders, hips and knees.  Skin: Intact, scaly lesion  on left arm.  Psych: Good eye contact, normal affect. Memory intact not anxious or depressed appearing.  CNS: CN 2-12 intact, power,  normal throughout.no focal deficits noted.   Assessment & Plan  Skin lesion of left arm 2 month h/o itchy pruritic lesion on left forearm, won't heal, scabby, also has lesions on face she was told were cancerous, need  derm eval   Allergic rhinitis Controlled, no change in medication   Cigarette smoker Asked:confirms currently smokes cigarettes Assess: Unwilling to set a quit date, but is cutting back Advise: needs to QUIT to reduce risk of cancer, cardio and cerebrovascular disease Assist: counseled for 5 minutes and literature provided Arrange: follow up in 2 to 4 months   HTN (hypertension) Controlled, no change in medication DASH diet and commitment to daily physical activity for a minimum of 30 minutes discussed and encouraged, as a part of hypertension management. The importance of attaining a healthy weight is also discussed.     06/01/2021    1:53 PM 02/28/2021    1:48 PM 02/01/2021   10:48 AM 12/30/2020   11:20 AM 12/02/2020    8:56 AM 11/29/2020    1:49 PM 11/24/2020    2:10 PM  BP/Weight  Systolic BP 944 967 591 638 466 599 357  Diastolic BP 74 85 72 84 72 95 76  Wt. (Lbs) 169.12 171.6 163 170.6 169 170.4 171  BMI 29.26 kg/m2 28.56 kg/m2 27.12 kg/m2 28.39 kg/m2 28.12 kg/m2 28.36 kg/m2 28.46 kg/m2       Prediabetes Patient educated about the importance of limiting  Carbohydrate intake , the need to commit to daily physical activity for a minimum of 30 minutes , and to commit weight loss. The fact that changes in all these areas will reduce or eliminate all together the development of diabetes is stressed.  Latest Ref Rng & Units 06/05/2021    8:07 AM 11/09/2020    9:20 AM 07/10/2020   10:25 AM 07/01/2020   11:43 AM 06/02/2020    1:07 PM  Diabetic Labs  HbA1c 4.8 - 5.6 % 8.3   5.9     6.2    Chol 100 - 199 mg/dL 168   174     246    HDL >39 mg/dL 67   66     73    Calc LDL 0 - 99 mg/dL 79   84     147    Triglycerides 0 - 149 mg/dL 128   138     147    Creatinine 0.57 - 1.00 mg/dL 1.00   0.93   0.70   0.90         06/01/2021    1:53 PM 02/28/2021    1:48 PM 02/01/2021   10:48 AM 12/30/2020   11:20 AM 12/02/2020    8:56 AM 11/29/2020    1:49 PM 11/24/2020    2:10  PM  BP/Weight  Systolic BP 326 712 458 099 833 825 053  Diastolic BP 74 85 72 84 72 95 76  Wt. (Lbs) 169.12 171.6 163 170.6 169 170.4 171  BMI 29.26 kg/m2 28.56 kg/m2 27.12 kg/m2 28.39 kg/m2 28.12 kg/m2 28.36 kg/m2 28.46 kg/m2      Latest Ref Rng & Units 09/07/2014   12:00 AM 06/10/2013    2:00 PM  Foot/eye exam completion dates  Eye Exam No Retinopathy No Retinopathy        Foot Form Completion   Done     This result is from an external source.    Updated lab needed at/ before next visit.   COPD (chronic obstructive pulmonary disease) with chronic bronchitis ([place]) Worsening due to ongoing nicotine use, needs to quit, continue current meds

## 2021-06-06 NOTE — Assessment & Plan Note (Signed)
Controlled, no change in medication  

## 2021-06-06 NOTE — Assessment & Plan Note (Signed)
Controlled, no change in medication DASH diet and commitment to daily physical activity for a minimum of 30 minutes discussed and encouraged, as a part of hypertension management. The importance of attaining a healthy weight is also discussed.     06/01/2021    1:53 PM 02/28/2021    1:48 PM 02/01/2021   10:48 AM 12/30/2020   11:20 AM 12/02/2020    8:56 AM 11/29/2020    1:49 PM 11/24/2020    2:10 PM  BP/Weight  Systolic BP 923 300 762 263 335 456 256  Diastolic BP 74 85 72 84 72 95 76  Wt. (Lbs) 169.12 171.6 163 170.6 169 170.4 171  BMI 29.26 kg/m2 28.56 kg/m2 27.12 kg/m2 28.39 kg/m2 28.12 kg/m2 28.36 kg/m2 28.46 kg/m2

## 2021-06-06 NOTE — Assessment & Plan Note (Signed)
Worsening due to ongoing nicotine use, needs to quit, continue current meds

## 2021-06-07 DIAGNOSIS — E119 Type 2 diabetes mellitus without complications: Secondary | ICD-10-CM | POA: Diagnosis not present

## 2021-06-07 DIAGNOSIS — J449 Chronic obstructive pulmonary disease, unspecified: Secondary | ICD-10-CM | POA: Diagnosis not present

## 2021-06-07 DIAGNOSIS — M159 Polyosteoarthritis, unspecified: Secondary | ICD-10-CM | POA: Diagnosis not present

## 2021-06-07 NOTE — Telephone Encounter (Signed)
LMTRC

## 2021-06-08 NOTE — Telephone Encounter (Signed)
Patient aware.

## 2021-06-10 LAB — CMP14+EGFR
ALT: 12 IU/L (ref 0–32)
AST: 15 IU/L (ref 0–40)
Albumin/Globulin Ratio: 1.6 (ref 1.2–2.2)
Albumin: 4.6 g/dL (ref 3.7–4.7)
Alkaline Phosphatase: 80 IU/L (ref 44–121)
BUN/Creatinine Ratio: 25 (ref 12–28)
BUN: 25 mg/dL (ref 8–27)
Bilirubin Total: 0.5 mg/dL (ref 0.0–1.2)
CO2: 22 mmol/L (ref 20–29)
Calcium: 11 mg/dL — ABNORMAL HIGH (ref 8.7–10.3)
Chloride: 102 mmol/L (ref 96–106)
Creatinine, Ser: 1 mg/dL (ref 0.57–1.00)
Globulin, Total: 2.9 g/dL (ref 1.5–4.5)
Glucose: 87 mg/dL (ref 70–99)
Potassium: 5.8 mmol/L — ABNORMAL HIGH (ref 3.5–5.2)
Sodium: 140 mmol/L (ref 134–144)
Total Protein: 7.5 g/dL (ref 6.0–8.5)
eGFR: 58 mL/min/{1.73_m2} — ABNORMAL LOW (ref >59)

## 2021-06-10 LAB — LIPID PANEL
Chol/HDL Ratio: 2.5 ratio (ref 0.0–4.4)
Cholesterol, Total: 168 mg/dL (ref 100–199)
HDL: 67 mg/dL (ref >39)
LDL Chol Calc (NIH): 79 mg/dL (ref 0–99)
Triglycerides: 128 mg/dL (ref 0–149)
VLDL Cholesterol Cal: 22 mg/dL (ref 5–40)

## 2021-06-10 LAB — CBC
Hematocrit: 46.9 % — ABNORMAL HIGH (ref 34.0–46.6)
Hemoglobin: 14.8 g/dL (ref 11.1–15.9)
MCH: 28.8 pg (ref 26.6–33.0)
MCHC: 31.6 g/dL (ref 31.5–35.7)
MCV: 91 fL (ref 79–97)
Platelets: 476 10*3/uL — ABNORMAL HIGH (ref 150–450)
RBC: 5.14 x10E6/uL (ref 3.77–5.28)
RDW: 12.9 % (ref 11.7–15.4)
WBC: 12.6 10*3/uL — ABNORMAL HIGH (ref 3.4–10.8)

## 2021-06-10 LAB — TSH: TSH: 1.85 u[IU]/mL (ref 0.450–4.500)

## 2021-06-10 LAB — VITAMIN D 25 HYDROXY (VIT D DEFICIENCY, FRACTURES): Vit D, 25-Hydroxy: 32.5 ng/mL (ref 30.0–100.0)

## 2021-06-10 LAB — HEMOGLOBIN A1C
Est. average glucose Bld gHb Est-mCnc: 128 mg/dL
Hgb A1c MFr Bld: 6.1 % — ABNORMAL HIGH (ref 4.8–5.6)

## 2021-06-13 ENCOUNTER — Ambulatory Visit (INDEPENDENT_AMBULATORY_CARE_PROVIDER_SITE_OTHER): Payer: Medicare Other | Admitting: Family Medicine

## 2021-06-13 ENCOUNTER — Other Ambulatory Visit: Payer: Self-pay | Admitting: Orthopaedic Surgery

## 2021-06-13 ENCOUNTER — Encounter: Payer: Self-pay | Admitting: Family Medicine

## 2021-06-13 VITALS — BP 114/58 | HR 92 | Ht 63.0 in | Wt 169.1 lb

## 2021-06-13 DIAGNOSIS — J029 Acute pharyngitis, unspecified: Secondary | ICD-10-CM | POA: Diagnosis not present

## 2021-06-13 DIAGNOSIS — J449 Chronic obstructive pulmonary disease, unspecified: Secondary | ICD-10-CM | POA: Diagnosis not present

## 2021-06-13 DIAGNOSIS — R7303 Prediabetes: Secondary | ICD-10-CM | POA: Diagnosis not present

## 2021-06-13 DIAGNOSIS — S0990XA Unspecified injury of head, initial encounter: Secondary | ICD-10-CM | POA: Diagnosis not present

## 2021-06-13 DIAGNOSIS — D75839 Thrombocytosis, unspecified: Secondary | ICD-10-CM

## 2021-06-13 DIAGNOSIS — D72828 Other elevated white blood cell count: Secondary | ICD-10-CM

## 2021-06-13 DIAGNOSIS — F17210 Nicotine dependence, cigarettes, uncomplicated: Secondary | ICD-10-CM

## 2021-06-13 LAB — POCT RAPID STREP A (OFFICE): Rapid Strep A Screen: NEGATIVE

## 2021-06-13 MED ORDER — TRAMADOL HCL 50 MG PO TABS: ORAL_TABLET | ORAL | 2 refills | Status: DC

## 2021-06-13 MED ORDER — CYCLOBENZAPRINE HCL 5 MG PO TABS: 5.0000 mg | ORAL_TABLET | Freq: Every evening | ORAL | 0 refills | Status: DC | PRN

## 2021-06-13 NOTE — Assessment & Plan Note (Signed)
Intermittent sore throat, negative strep test

## 2021-06-13 NOTE — Patient Instructions (Signed)
F/U as before, call if you need me  sooner  You are referred to hematology/ oncology re  mildly elevated platelets and white   blood cell count   Blood sugar remains good,eat mainly vegetables and drink water, you are NOT diabetic   Continue to work on cutting back on cigarettes, current is ove r 10  No strep infection  No problems following fall  Thanks for choosing [facility], we consider it a privelige to serve you.

## 2021-06-13 NOTE — Telephone Encounter (Signed)
Patient called at 9:03 am and left a message stating she needs refills on the following medicine    traMADol (ULTRAM) 50 MG tablet  cyclobenzaprine (FLEXERIL) 5 MG tablet  Pharmacy:  [facility]

## 2021-06-16 ENCOUNTER — Encounter: Payer: Self-pay | Admitting: Family Medicine

## 2021-06-16 NOTE — Assessment & Plan Note (Signed)
No eryhtema , tenderness or r broken skin noted, memory intact

## 2021-06-16 NOTE — Assessment & Plan Note (Signed)
Deteriorating ongoing nicotine use

## 2021-06-16 NOTE — Assessment & Plan Note (Signed)
Asked:confirms currently smokes cigarettes 10/day Assess: Unwilling to set a quit date, but is cutting back Advise: needs to QUIT to reduce risk of cancer, cardio and cerebrovascular disease Assist: counseled for 5 minutes and literature provided Arrange: follow up in 2 to 4 months  

## 2021-06-16 NOTE — Progress Notes (Signed)
[name]     MRN: [id number]      DOB: May 16, 1945   HPI Ms. Ohlin is here for follow up and re-evaluation of chronic medical conditions, medication management and review of any available recent lab and radiology data.  Preventive health is updated, specifically  Cancer screening and Immunization.   Questions or concerns regarding consultations or procedures which the PT has had in the interim are  addressed. The PT denies any adverse reactions to current medications since the last visit.  Patient is in because of elevated hBA1C which was corrected to be normal byy lab, called after the visit stating that initial report was wrong lab error Pt c/o sore throat x 2 weeks, galnds tender at tiimes, no fever or chills Fell recently  on 05/19/2021, no bruiosing noted , no open laceration  ROS Denies recent fever or chills. Denies sinus pressure, nasal congestion, ear pain or sore throat. Denies chest congestion, productive cough or wheezing. Denies chest pains, palpitations and leg swelling Denies abdominal pain, nausea, vomiting,diarrhea or constipation.   Denies dysuria, frequency, hesitancy or incontinence. Denies joint pain, swelling and limitation in mobility. Denies headaches, seizures, numbness, or tingling. Denies depression, anxiety or insomnia. Denies skin break down or rash.   PE  BP (!) 114/58   Pulse 92   Ht '5\' 3"'$  (1.6 m)   Wt 169 lb 1.9 oz (76.7 kg)   SpO2 94%   BMI 29.96 kg/m   Patient alert and oriented and in no cardiopulmonary distress.  HEENT: No facial asymmetry, EOMI,     Neck supple .  Chest: Clear to auscultation bilaterally.decreased air entry bilaterally  CVS: S1, S2 no murmurs, no S3.Regular rate.  ABD: Soft non tender.   Ext: No edema  MS: Adequate  though reduced ROM spine, shoulders, hips and knees.  Skin: Intact, no ulcerations or rash noted.  Psych: Good eye contact, normal affect. Memory intact not anxious or depressed  appearing.  CNS: CN 2-12 intact, power,  normal throughout.no focal deficits noted.   Assessment & Plan  Sore throat Intermittent sore throat, negative strep test  Head trauma No eryhtema , tenderness or r broken skin noted, memory intact  Cigarette smoker Asked:confirms currently smokes cigarettes 10/ day Assess: Unwilling to set a quit date, but is cutting back Advise: needs to QUIT to reduce risk of cancer, cardio and cerebrovascular disease Assist: counseled for 5 minutes and literature provided Arrange: follow up in 2 to 4 months   COPD (chronic obstructive pulmonary disease) with chronic bronchitis ([place]) Deteriorating ongoing nicotine use  Prediabetes Patient educated about the importance of limiting  Carbohydrate intake , the need to commit to daily physical activity for a minimum of 30 minutes , and to commit weight loss. The fact that changes in all these areas will reduce or eliminate all together the development of diabetes is stressed.      Latest Ref Rng & Units 06/05/2021    8:07 AM 11/09/2020    9:20 AM 07/10/2020   10:25 AM 07/01/2020   11:43 AM 06/02/2020    1:07 PM  Diabetic Labs  HbA1c 4.8 - 5.6 % 6.1  C 5.9     6.2    Chol 100 - 199 mg/dL 168   174     246    HDL >39 mg/dL 67   66     73    Calc LDL 0 - 99 mg/dL 79   84  147    Triglycerides 0 - 149 mg/dL 128   138     147    Creatinine 0.57 - 1.00 mg/dL 1.00   0.93   0.70   0.90       C Corrected result      06/13/2021   10:45 AM 06/01/2021    1:53 PM 02/28/2021    1:48 PM 02/01/2021   10:48 AM 12/30/2020   11:20 AM 12/02/2020    8:56 AM 11/29/2020    1:49 PM  BP/Weight  Systolic BP 094 076 808 811 031 594 585  Diastolic BP 58 74 85 72 84 72 95  Wt. (Lbs) 169.12 169.12 171.6 163 170.6 169 170.4  BMI 29.96 kg/m2 29.26 kg/m2 28.56 kg/m2 27.12 kg/m2 28.39 kg/m2 28.12 kg/m2 28.36 kg/m2      Latest Ref Rng & Units 09/07/2014   12:00 AM 06/10/2013    2:00 PM  Foot/eye exam completion dates  Eye  Exam No Retinopathy No Retinopathy        Foot Form Completion   Done     This result is from an external source.      Thrombocytosis Thrombocytosis and mild leukocytosis, persistent refer hematology

## 2021-06-16 NOTE — Assessment & Plan Note (Signed)
Patient educated about the importance of limiting  Carbohydrate intake , the need to commit to daily physical activity for a minimum of 30 minutes , and to commit weight loss. The fact that changes in all these areas will reduce or eliminate all together the development of diabetes is stressed.      Latest Ref Rng & Units 06/05/2021    8:07 AM 11/09/2020    9:20 AM 07/10/2020   10:25 AM 07/01/2020   11:43 AM 06/02/2020    1:07 PM  Diabetic Labs  HbA1c 4.8 - 5.6 % 6.1  C 5.9     6.2    Chol 100 - 199 mg/dL 168   174     246    HDL >39 mg/dL 67   66     73    Calc LDL 0 - 99 mg/dL 79   84     147    Triglycerides 0 - 149 mg/dL 128   138     147    Creatinine 0.57 - 1.00 mg/dL 1.00   0.93   0.70   0.90       C Corrected result      06/13/2021   10:45 AM 06/01/2021    1:53 PM 02/28/2021    1:48 PM 02/01/2021   10:48 AM 12/30/2020   11:20 AM 12/02/2020    8:56 AM 11/29/2020    1:49 PM  BP/Weight  Systolic BP 222 979 892 119 417 408 144  Diastolic BP 58 74 85 72 84 72 95  Wt. (Lbs) 169.12 169.12 171.6 163 170.6 169 170.4  BMI 29.96 kg/m2 29.26 kg/m2 28.56 kg/m2 27.12 kg/m2 28.39 kg/m2 28.12 kg/m2 28.36 kg/m2      Latest Ref Rng & Units 09/07/2014   12:00 AM 06/10/2013    2:00 PM  Foot/eye exam completion dates  Eye Exam No Retinopathy No Retinopathy        Foot Form Completion   Done     This result is from an external source.

## 2021-06-16 NOTE — Assessment & Plan Note (Signed)
Thrombocytosis and mild leukocytosis, persistent refer hematology

## 2021-06-20 ENCOUNTER — Other Ambulatory Visit: Payer: Self-pay | Admitting: Family Medicine

## 2021-06-20 DIAGNOSIS — J3089 Other allergic rhinitis: Secondary | ICD-10-CM

## 2021-06-20 DIAGNOSIS — R918 Other nonspecific abnormal finding of lung field: Secondary | ICD-10-CM

## 2021-06-20 DIAGNOSIS — J449 Chronic obstructive pulmonary disease, unspecified: Secondary | ICD-10-CM

## 2021-06-21 ENCOUNTER — Ambulatory Visit (INDEPENDENT_AMBULATORY_CARE_PROVIDER_SITE_OTHER): Payer: Medicare Other | Admitting: Family Medicine

## 2021-06-21 ENCOUNTER — Encounter: Payer: Self-pay | Admitting: Family Medicine

## 2021-06-21 VITALS — BP 144/82 | HR 96 | Resp 16 | Ht 63.0 in | Wt 171.8 lb

## 2021-06-21 DIAGNOSIS — I10 Essential (primary) hypertension: Secondary | ICD-10-CM

## 2021-06-21 DIAGNOSIS — J449 Chronic obstructive pulmonary disease, unspecified: Secondary | ICD-10-CM | POA: Diagnosis not present

## 2021-06-21 DIAGNOSIS — F17210 Nicotine dependence, cigarettes, uncomplicated: Secondary | ICD-10-CM | POA: Diagnosis not present

## 2021-06-21 MED ORDER — AMLODIPINE BESYLATE 5 MG PO TABS: 5.0000 mg | ORAL_TABLET | Freq: Every day | ORAL | 3 refills | Status: DC

## 2021-06-21 NOTE — Patient Instructions (Signed)
F/U July 5 afternoon after seen by Dr. Melvyn Novas if possible re evaluate blood pressure  New for  blood pressure is amlodipine 5 mg daily, continue spironolactone as before, stop avapro  STOP Astelin and Flonase nose sprays  Thanks for choosing [facility], we consider it a privelige to serve you.

## 2021-06-27 ENCOUNTER — Ambulatory Visit: Payer: Medicare Other | Admitting: Orthopaedic Surgery

## 2021-06-29 DIAGNOSIS — D0462 Carcinoma in situ of skin of left upper limb, including shoulder: Secondary | ICD-10-CM | POA: Diagnosis not present

## 2021-07-06 DIAGNOSIS — E119 Type 2 diabetes mellitus without complications: Secondary | ICD-10-CM | POA: Diagnosis not present

## 2021-07-06 DIAGNOSIS — M159 Polyosteoarthritis, unspecified: Secondary | ICD-10-CM | POA: Diagnosis not present

## 2021-07-06 DIAGNOSIS — J449 Chronic obstructive pulmonary disease, unspecified: Secondary | ICD-10-CM | POA: Diagnosis not present

## 2021-07-08 DIAGNOSIS — J449 Chronic obstructive pulmonary disease, unspecified: Secondary | ICD-10-CM | POA: Diagnosis not present

## 2021-07-09 ENCOUNTER — Encounter: Payer: Self-pay | Admitting: Family Medicine

## 2021-07-17 ENCOUNTER — Other Ambulatory Visit: Payer: Self-pay | Admitting: Family Medicine

## 2021-07-17 DIAGNOSIS — J449 Chronic obstructive pulmonary disease, unspecified: Secondary | ICD-10-CM

## 2021-07-17 DIAGNOSIS — R918 Other nonspecific abnormal finding of lung field: Secondary | ICD-10-CM

## 2021-07-19 ENCOUNTER — Encounter: Payer: Self-pay | Admitting: Family Medicine

## 2021-07-19 ENCOUNTER — Other Ambulatory Visit: Payer: Self-pay | Admitting: Orthopaedic Surgery

## 2021-07-19 ENCOUNTER — Ambulatory Visit (INDEPENDENT_AMBULATORY_CARE_PROVIDER_SITE_OTHER): Payer: Medicare Other | Admitting: Family Medicine

## 2021-07-19 ENCOUNTER — Ambulatory Visit (INDEPENDENT_AMBULATORY_CARE_PROVIDER_SITE_OTHER): Payer: Medicare Other | Admitting: Internal Medicine

## 2021-07-19 ENCOUNTER — Encounter: Payer: Self-pay | Admitting: Internal Medicine

## 2021-07-19 VITALS — BP 124/78 | HR 58 | Ht 64.0 in | Wt 169.1 lb

## 2021-07-19 DIAGNOSIS — F17210 Nicotine dependence, cigarettes, uncomplicated: Secondary | ICD-10-CM

## 2021-07-19 DIAGNOSIS — D539 Nutritional anemia, unspecified: Secondary | ICD-10-CM | POA: Diagnosis not present

## 2021-07-19 DIAGNOSIS — I10 Essential (primary) hypertension: Secondary | ICD-10-CM | POA: Diagnosis not present

## 2021-07-19 DIAGNOSIS — E785 Hyperlipidemia, unspecified: Secondary | ICD-10-CM

## 2021-07-19 DIAGNOSIS — Z87891 Personal history of nicotine dependence: Secondary | ICD-10-CM | POA: Diagnosis not present

## 2021-07-19 DIAGNOSIS — J449 Chronic obstructive pulmonary disease, unspecified: Secondary | ICD-10-CM

## 2021-07-19 DIAGNOSIS — E538 Deficiency of other specified B group vitamins: Secondary | ICD-10-CM | POA: Diagnosis not present

## 2021-07-19 MED ORDER — TRAMADOL HCL 50 MG PO TABS: ORAL_TABLET | ORAL | 0 refills | Status: DC

## 2021-07-19 NOTE — Patient Instructions (Addendum)
Keep appointment for annual exam in novemebr as scheduled  Congrats, you are now smoking less than 10 ciggs/ day, keep going down    Blood pressure is excellent, no med change  Please get CBC,  fasting lipid, cmp an EGFR, B12 and iron and ferritin with next lab draw  Continue B12 1000 mcg every other day  Thanks for choosing [facility], we consider it a privelige to serve you.

## 2021-07-19 NOTE — Patient Instructions (Signed)
Plan A = Automatic = Always=    Symbicort 160 Take 2 puffs first thing in am and then another 2 puffs about 12 hours later.     Work on inhaler technique:  relax and gently blow all the way out then take a nice smooth full deep breath back in, triggering the inhaler at same time you start breathing in.  Hold for up to 5 seconds if you can. Blow out thru nose. Rinse and gargle with water when done.  If mouth or throat bother you at all,  try brushing teeth/gums/tongue with arm and hammer toothpaste/ make a slurry and gargle and spit out.       Plan B = Backup (to supplement plan A, not to replace it) Only use your albuterol inhaler as a rescue medication to be used if you can't catch your breath by resting or doing a relaxed purse lip breathing pattern.  - The less you use it, the better it will work when you need it. - Ok to use the inhaler up to 2 puffs  every 4 hours if you must but call for appointment if use goes up over your usual need - Don't leave home without it !!  (think of it like the spare tire for your car)   Plan C = Crisis (instead of Plan B but only if Plan B stops working) - only use your albuterol nebulizer if you first try Plan B and it fails to help > ok to use the nebulizer up to every 4 hours but if start needing it regularly call for immediate appointment  PFTs prior to next office visit   The key is to stop smoking completely before smoking completely stops you!   Please schedule a follow up visit in 6 months but call sooner if needed  with all medications /inhalers/ solutions in hand so we can verify exactly what you are taking. This includes all medications from all doctors and over the counters

## 2021-07-19 NOTE — Telephone Encounter (Signed)
Patient called to request refill on the following medicine   traMADol (ULTRAM) 50 MG tablet  cyclobenzaprine (FLEXERIL) 5 MG tablet  Pharmacy [facility]

## 2021-07-19 NOTE — Progress Notes (Signed)
[name], female    DOB: Mar 08, 1945    MRN: [id number]   Brief patient profile:  87  yowf active smoker with new onset doe around 2014 dx as copd (though PFTs 03/03/12 did not confirm) and referred to pulmonary clinic in [facility]  11/19/2019 by Dr    Moshe Cipro for refractory cough and sob  History of Present Illness  11/19/2019  Pulmonary/ 1st office eval/ Merrily Tegeler / [place] on symbicort 160/atrovent Chief Complaint  Patient presents with   Consult    sinus congestion for about a 1-2 weeks  Dyspnea:  Goal mb and back 100 ft /  Variably sob shopping / not using HC parking  Cough: sporadic  Sleep: fine / some worse cough in am SABA use: not clear  rec Benazapril 40 mg  > stop this and replace with ibesartan 300 mg one daily in its place  Plan A = Automatic = Always=    symbicort 160 Take 2 puffs first thing in am and then another 2 puffs about 12 hours later.  Work on inhaler technique:   Plan B = Backup (to supplement plan A, not to replace it) Only use your albuterol inhaler as a rescue medication  Plan C = Crisis (instead of Plan B but only if Plan B stops working) - only use your albuterol nebulizer if you first try Plan B and it fails to help  The key is to stop smoking completely before smoking completely stops you! Please schedule a follow up office visit in 6 weeks, call sooner if needed with all medications     12/29/2019  f/u ov/[place] office/Pearce Littlefield re: doe/ brown mucus x one week already took medrol dose pack p televisit with pcp  Chief Complaint  Patient presents with   Follow-up    Still bothered by sinus congestion. She is c/o cough with brown sputum over the past wk. She is using the albuterol inhaler about 3 x per wk.   Asthmatic bronchitis, chronic (HCC) Active smoker  - PFT's  03/03/2012  FEV1 1.69 (69 % ) ratio 0.72  p 5 % improvement from saba p ? prior to study with DLCO  17.42 (68%) corrects to 4.53 (92%)  for alv volume and FV curve min  curvature/ ERV 37%   - try off acei 11/19/2019 and return for medication reconciliation in 6 weeks  Hypertension goal BP (blood pressure) < 130/80  Dyspnea:  Better though very inactive Cough:  X one week worse as above   SABA use: very poor insight into ABC plan, could not articulate any part of it even with prompting  02: none  rec Continue Irbesartan 300 mg one daily plus add  hydrodiuril (hydrochlorothiazide) 25 mg one daily  zpak should changes mucus brown to clear  The key is to stop smoking completely before smoking completely stops you! For cough > mucinex 1200 mg every 12 hours as needed (over the counter) Plan A = Automatic = Always=    symbicort 160 Take 2 puffs first thing in am and then another 2 puffs about 12 hours later atrovent is 2 puffs four times daily  Work on inhaler technique:  Plan B = Backup (to supplement plan A, not to replace it) Only use your albuterol inhaler as a rescue medication to be used if you can't catch your breath  Plan C = Crisis (instead of Plan B but only if Plan B stops working) - only use your albuterol nebulizer if you  first try Plan B and it fails to help > ok to use the nebulizer up to every 4 hours but if start needing it regularly call for immediate appointment Please schedule a follow up office visit in 6 weeks with pfts on return      04/04/2020   f/u ov/[place] office/Litha Lamartina   Breathing is doing some better since the last visit. She is coughing less but still has prod cough with yellow to clear sputum.  Dyspnea:  mb and back better Cough: some am cough  Sleeping: bed is flat right down one pillow like a log  SABA use: 2 bid  02: none  Covid status: vax x 3  Rec The key is to stop smoking completely before smoking completely stops you! Only use your albuterol as a rescue medication  Work on inhaler technique:   Bring the empty symbicort container with you - remember the golfer and the practice swings    11/16/2020  f/u  ov/[place] office/Garima Chronis re: AB maint on symbicort 160  /confused with names of meds just finished doxy no better  Chief Complaint  Patient presents with   Follow-up    Cough with clear mucus, congestion, no fever or wheezing, sore when swallowing for a few weeks.     Dyspnea:  mb and back ok  Cough: more than usual, clear mucus / esp in am p wake up  Sleeping: flat bed 3-4 pillows  SABA use: once a week / rare neb  02: none  Covid status: vax x 3  Rec Stop fosamax (alendronate) and prilosec (omeprazole ) Pantoprazole (protonix) 40 mg   Take  30-60 min before first meal of the day and Pepcid (famotidine)  20 mg after supper until return to office  For cough > mucinex dm up to 1200 mg every 12 hours as needed Depomedrol 120 mg IM today  GERD diet reviewed, bed blocks rec  Plan A = Automatic = Always=   Symbicort 80 or 160 Take 2 puffs first thing in am and then another 2 puffs about 12 hours later.   Work on inhaler technique:  Keep the empty symbicort and practice like  a golfer Plan B = Backup (to supplement plan A, not to replace it) Only use your albuterol inhaler as a rescue medication Plan C = Crisis (instead of Plan B but only if Plan B stops working) - only use your albuterol nebulizer if you first try Plan B and it fails to help > ok to use the nebulizer up to every 4 hours but if start needing it regularly call for immediate appointment Please schedule a follow up office visit in 6 weeks, call sooner if needed with all medications /inhalers/ solutions in hand    12/30/2020  f/u ov/[place] office/Saleha Kalp re: AB maint on symbicort 160 2bid  though hfa poor Chief Complaint  Patient presents with   Follow-up    Cough with clear mucus, congestion are about the same since last OV.   Dyspnea:  50 ft mailbox and slt uphill s  stopping Cough: improved, st gone, swallowing ok / min mucoid production Sleeping: flat bed planning on electric bed  SABA use: misunderstood how/when to  use saba  02: none  Covid status: vax "right many"  Rec For cough > robitussin dm  is fine   Plan A = Automatic = Always=   Symbicort  160  2 bid  Work on inhaler technique:  Plan B = Backup (to supplement plan A,  not to replace it) Only use your albuterol inhaler as a rescue medication   Plan C = Crisis (instead of Plan B but only if Plan B stops working) - only use your albuterol nebulizer if you first try Plan B and it fails to help > ok to use the nebulizer up to every 4 hours but if start needing it regularly call for immediate appointment Ok to try albuterol 15 min before an activity (on alternating days inhaler vs nebulizer vs nothing )   . Please schedule a follow up visit in 6 months but call sooner if needed and bring all inhalers    07/19/2021  f/u ov/[place] office/Alanzo Lamb re: AB maint on symbicort 160 2bid   Chief Complaint  Patient presents with   Follow-up    Doing well. No new concerns    Dyspnea:  MB and back easier / no real steps  Cough: still smoker's cough worse in ams Sleeping: does fine bed is flat with pillows  SABA use: none now  02: none         No obvious day to day or daytime variability or assoc excess/ purulent sputum or mucus plugs or hemoptysis or cp or chest tightness, subjective wheeze or overt sinus or hb symptoms.   sleeping without nocturnal  or early am exacerbation  of respiratory  c/o's or need for noct saba. Also denies any obvious fluctuation of symptoms with weather or environmental changes or other aggravating or alleviating factors except as outlined above   No unusual exposure hx or h/o childhood pna/ asthma or knowledge of premature birth.  Current Allergies, Complete Past Medical History, Past Surgical History, Family History, and Social History were reviewed in [employer] record.  ROS  The following are not active complaints unless bolded Hoarseness, sore throat, dysphagia, dental problems, itching, sneezing,   nasal congestion or discharge of excess mucus or purulent secretions, ear ache,   fever, chills, sweats, unintended wt loss or wt gain, classically pleuritic or exertional cp,  orthopnea pnd or arm/hand swelling  or leg swelling, presyncope, palpitations, abdominal pain, anorexia, nausea, vomiting, diarrhea  or change in bowel habits or change in bladder habits, change in stools or change in urine, dysuria, hematuria,  rash, arthralgias, visual complaints, headache, numbness, weakness or ataxia or problems with walking or coordination,  change in mood or  memory.        Current Meds  Medication Sig   acetaminophen (TYLENOL) 325 MG tablet Take 650 mg by mouth every 6 (six) hours as needed for moderate pain.    albuterol (PROVENTIL) (2.5 MG/3ML) 0.083% nebulizer solution INHALE ONE VIAL VIA NEBULIZER THREE TIMES DAILY. (Patient taking differently: Take 2.5 mg by nebulization 3 (three) times daily as needed for wheezing or shortness of breath.)   albuterol (VENTOLIN HFA) 108 (90 Base) MCG/ACT inhaler INHALE (2) PUFFS EVERY FOURHOURS AS NEEDED ONLY IF YOU CANT CATCH YOUR BREATH   amLODipine (NORVASC) 5 MG tablet Take 1 tablet (5 mg total) by mouth daily.   aspirin EC 81 MG tablet Take 81 mg by mouth daily.   ATROVENT HFA 17 MCG/ACT inhaler INHALE 2 PUFFS BY MOUTH FOUR TIMES A DAY.   Calcium Carbonate-Vitamin D (CALCIUM 600 + D PO) Take 1 tablet by mouth 2 (two) times daily.   Cyanocobalamin (VITAMIN B 12) 500 MCG TABS Take one tablet by mouth once daily   cyclobenzaprine (FLEXERIL) 5 MG tablet Take 1 tablet (5 mg total) by mouth at bedtime  as needed.   Emollient (VASELINE INTENSIVE CARE EX) Apply 1 application topically daily.   famotidine (PEPCID) 20 MG tablet Take 20 mg by mouth at bedtime.    fluticasone (FLONASE) 50 MCG/ACT nasal spray PLACE 2 SPRAYS IN EACH NOSTRIL DAILY.   loratadine (CLARITIN) 10 MG tablet TAKE 1 TABLET BY MOUTH ONCE DAILY FOR ALLERGIES. (Patient taking differently: Take 10 mg  by mouth daily.)   Misc. Devices MISC Please provide patient with mastectomy bra and prosthesis. Dx: History of left breast cancer   Naphazoline HCl (CLEAR EYES OP) Place 1 drop into both eyes daily as needed (irritation).   pantoprazole (PROTONIX) 40 MG tablet TAKE ONE TABLET BY MOUTH ONCE DAILY BEFORE BREAKFAST.   polyethylene glycol powder (GLYCOLAX/MIRALAX) powder MIX 1 CAPFUL IN 8 OUNCES OF JUICE OR WATER AND DRINK ONCE DAILY. (Patient taking differently: Take 17 g by mouth daily.)   potassium chloride SA (KLOR-CON) 20 MEQ tablet TAKE 1 TABLET BY MOUTH DAILY. (Patient taking differently: Take 20 mEq by mouth daily.)   rosuvastatin (CRESTOR) 10 MG tablet TAKE 1 TABLET BY MOUTH ONCE A DAY.   spironolactone (ALDACTONE) 50 MG tablet TAKE 1 TABLET BY MOUTH ONCE A DAY.   SYMBICORT 160-4.5 MCG/ACT inhaler INHALE 2 PUFFS INTO THE LUNGS TWICE DAILY.   traMADol (ULTRAM) 50 MG tablet One tablet every eight hours as needed for pain.                   Past Medical History:  Diagnosis Date   Breast cancer ([place]) 2007   Stage I (T1b N0 M0), grade 1 well-differentiated carcinoma of the left breast status, post lumpectomy followed by radiation therapy. Her estrogen receptor receptors were 93%, progesterone receptors 67%. HER-2/neu was negative. No lymphovascular space invasion was seen. All margins were clear. Ki-67 marker was low at 1% with surgery on 11/15/2004. Treated then with post-lumpectomy radiation, finish   Breast cancer, left breast ([place]) 2007   COPD (chronic obstructive pulmonary disease) ([place])    Diabetes mellitus without complication ([place])    Diverticula of colon    Hx of rickettsial disease    Hyperlipidemia    Hypertension    Kidney stones    Nicotine dependence    Osteoarthritis        Objective:    Wts  07/19/2021          169  12/30/2020     170 11/16/2020       174     04/04/2020       174  12/29/19 179 lb (81.2 kg)  12/04/19 177 lb (80.3 kg)  11/19/19 177 lb 3.2 oz  (80.4 kg)     Vital signs reviewed  07/19/2021  - Note at rest 02 sats  97% on RA   General appearance:    amb wf/ smoker's rattle / resting tremor    HEENT : Oropharynx  nl  Nasal turbinates nl    NECK :  without  apparent JVD/ palpable Nodes/TM    LUNGS: no acc muscle use,  Mild barrel  contour chest wall with bilateral  Distant bs s audible wheeze and  without cough on insp or exp maneuvers  and mild  Hyperresonant  to  percussion bilaterally     CV:  RRR  no s3 or murmur or increase in P2, and no edema   ABD:  soft and nontender with pos end  insp Hoover's  in the supine position.  No bruits or organomegaly appreciated  MS:  Nl gait/ ext warm without deformities Or obvious joint restrictions  calf tenderness, cyanosis or clubbing     SKIN: warm and dry without lesions    NEURO:  alert, approp, nl sensorium with  no motor or cerebellar deficits apparent.            I personally reviewed images and agree with radiology impression as follows:   Chest CT 03/21/21 1. Lung-RADS 2S, benign appearance or behavior. Continue annual screening with low-dose chest CT without contrast in 12 months. 2. The "S" modifier above refers to potentially clinically significant non lung cancer related findings. Specifically, there is aortic atherosclerosis, in addition to left main and three-vessel coronary artery disease  3. Mild diffuse bronchial wall thickening with mild centrilobular and paraseptal emphysema; imaging findings suggestive of underlying COPD. Assessment

## 2021-07-20 ENCOUNTER — Encounter: Payer: Self-pay | Admitting: Internal Medicine

## 2021-07-20 NOTE — Assessment & Plan Note (Signed)
Active smoker  - PFT's  03/03/2012  FEV1 1.69 (69 % ) ratio 0.72  p 5 % improvement from saba p ? prior to study with DLCO  17.42 (68%) corrects to 4.53 (92%)  for alv volume and FV curve min curvature/ ERV 37%   - try off acei 11/19/2019 and return for medication reconciliation in 6 weeks - 12/30/2020  After extensive coaching inhaler device,  effectiveness =    75% from a baseline of 50%  - 07/19/2021  After extensive coaching inhaler device,  effectiveness =    75% (short Ti)   Has emphysema on LDSCT but pattern is more c/w AB in smoker so rec continue symbicort 160 2bid and f/u in 6 m with pfts in meantime

## 2021-07-20 NOTE — Assessment & Plan Note (Signed)
Counseled re importance of smoking cessation but did not meet time criteria for separate billing     F/u in 6 m with all meds in hand using a trust but verify approach to confirm accurate Medication  Reconciliation The principal here is that until we are certain that the  patients are doing what we've asked, it makes no sense to ask them to do more.     Each maintenance medication was reviewed in detail including emphasizing most importantly the difference between maintenance and prns and under what circumstances the prns are to be triggered using an action plan format where appropriate.  Total time for H and P, chart review, counseling, reviewing hfa device(s) and generating customized AVS unique to this office visit / same day charting = 25 min

## 2021-07-24 ENCOUNTER — Encounter: Payer: Self-pay | Admitting: Family Medicine

## 2021-07-24 NOTE — Assessment & Plan Note (Signed)
Generalized chronic pain treated by Ortho , on tramadol

## 2021-07-24 NOTE — Progress Notes (Signed)
   [name]     MRN: [id number]      DOB: 12/06/1945   HPI [name] is here for follow up and re-evaluation of chronic medical conditions, medication management and review of any available recent lab and radiology data.  Preventive health is updated, specifically  Cancer screening and Immunization.   Questions or concerns regarding consultations or procedures which the PT has had in the interim are  addressed. The PT denies any adverse reactions to current medications since the last visit.  There are no new concerns.  There are no specific complaints   ROS Denies recent fever or chills. Denies sinus pressure, nasal congestion, ear pain or sore throat. Chronic smoker's cough Denies chest pains, palpitations and leg swelling Denies abdominal pain, nausea, vomiting,diarrhea or constipation.   Denies dysuria, frequency, hesitancy or incontinence. C/o chronic joint pain, swelling and limitation in mobility. Denies headaches, seizures, numbness, or tingling. Denies depression, anxiety or insomnia. Denies skin break down or rash.   PE  BP 124/78   Pulse (!) 58   Ht '5\' 4"'$  (1.626 m)   Wt 169 lb 1.3 oz (76.7 kg)   SpO2 93%   BMI 29.02 kg/m   Patient alert and oriented and in no cardiopulmonary distress.  HEENT: No facial asymmetry, EOMI,     Neck supple .  Chest: decreased air entry , scattered crackles and wheezes  CVS: S1, S2 no murmurs, no S3.Regular rate.  ABD: Soft non tender.   Ext: No edema  MS: decreased ROM spine, shoulders, hips and knees.  Skin: Intact, no ulcerations or rash noted.  Psych: Good eye contact, normal affect. Memory intact not anxious or depressed appearing.  CNS: CN 2-12 intact, power,  normal throughout.no focal deficits noted.   Assessment & Plan  HTN (hypertension) Controlled, no change in medication DASH diet and commitment to daily physical activity for a minimum of 30 minutes discussed and encouraged, as a part of hypertension  management. The importance of attaining a healthy weight is also discussed.     07/19/2021    2:59 PM 07/19/2021    2:20 PM 07/19/2021    1:40 PM 06/21/2021    1:07 PM 06/13/2021   10:45 AM 06/01/2021    1:53 PM 02/28/2021    1:48 PM  BP/Weight  Systolic BP 458 592 [phone] 711  Diastolic BP 78 84 84 82 58 74 85  Wt. (Lbs)  169.08 169.4 171.8 169.12 169.12 171.6  BMI  29.02 kg/m2 29.08 kg/m2 30.43 kg/m2 29.96 kg/m2 29.26 kg/m2 28.56 kg/m2       Hyperlipidemia LDL goal <100 Hyperlipidemia:Low fat diet discussed and encouraged.   Lipid Panel  Lab Results  Component Value Date   CHOL 168 06/05/2021   HDL 67 06/05/2021   LDLCALC 79 06/05/2021   TRIG 128 06/05/2021   CHOLHDL 2.5 06/05/2021     Controlled, no change in medication   Personal history of tobacco use, presenting hazards to health Asked:confirms currently smokes cigarettes, 10/day Assess: Unwilling to set a quit date, but is cutting back Advise: needs to QUIT to reduce risk of cancer, cardio and cerebrovascular disease Assist: counseled for 5 minutes and literature provided Arrange: follow up in 2 to 4 months   GENERALIZED OSTEOARTHROSIS UNSPECIFIED SITE Generalized chronic pain treated by Ortho , on tramadol

## 2021-07-24 NOTE — Assessment & Plan Note (Signed)
Controlled, no change in medication DASH diet and commitment to daily physical activity for a minimum of 30 minutes discussed and encouraged, as a part of hypertension management. The importance of attaining a healthy weight is also discussed.     07/19/2021    2:59 PM 07/19/2021    2:20 PM 07/19/2021    1:40 PM 06/21/2021    1:07 PM 06/13/2021   10:45 AM 06/01/2021    1:53 PM 02/28/2021    1:48 PM  BP/Weight  Systolic BP 388 719 597 471 855 015 868  Diastolic BP 78 84 84 82 58 74 85  Wt. (Lbs)  169.08 169.4 171.8 169.12 169.12 171.6  BMI  29.02 kg/m2 29.08 kg/m2 30.43 kg/m2 29.96 kg/m2 29.26 kg/m2 28.56 kg/m2

## 2021-07-24 NOTE — Assessment & Plan Note (Signed)
Hyperlipidemia:Low fat diet discussed and encouraged.   Lipid Panel  Lab Results  Component Value Date   CHOL 168 06/05/2021   HDL 67 06/05/2021   LDLCALC 79 06/05/2021   TRIG 128 06/05/2021   CHOLHDL 2.5 06/05/2021     Controlled, no change in medication

## 2021-07-24 NOTE — Assessment & Plan Note (Signed)
Asked:confirms currently smokes cigarettes, 10/day Assess: Unwilling to set a quit date, but is cutting back Advise: needs to QUIT to reduce risk of cancer, cardio and cerebrovascular disease Assist: counseled for 5 minutes and literature provided Arrange: follow up in 2 to 4 months

## 2021-08-03 DIAGNOSIS — E119 Type 2 diabetes mellitus without complications: Secondary | ICD-10-CM | POA: Diagnosis not present

## 2021-08-03 DIAGNOSIS — J449 Chronic obstructive pulmonary disease, unspecified: Secondary | ICD-10-CM | POA: Diagnosis not present

## 2021-08-03 DIAGNOSIS — M159 Polyosteoarthritis, unspecified: Secondary | ICD-10-CM | POA: Diagnosis not present

## 2021-08-07 DIAGNOSIS — J449 Chronic obstructive pulmonary disease, unspecified: Secondary | ICD-10-CM | POA: Diagnosis not present

## 2021-08-11 ENCOUNTER — Ambulatory Visit (HOSPITAL_COMMUNITY)
Admission: RE | Admit: 2021-08-11 | Discharge: 2021-08-11 | Disposition: A | Discharge status: Home / Self Care | Payer: Medicare Other | Source: Ambulatory Visit | Attending: Family Medicine | Admitting: Family Medicine

## 2021-08-11 DIAGNOSIS — Z1231 Encounter for screening mammogram for malignant neoplasm of breast: Secondary | ICD-10-CM | POA: Insufficient documentation

## 2021-08-18 ENCOUNTER — Other Ambulatory Visit: Payer: Self-pay | Admitting: Orthopedic Surgery

## 2021-08-18 ENCOUNTER — Ambulatory Visit (HOSPITAL_COMMUNITY): Payer: Medicare Other

## 2021-08-21 ENCOUNTER — Other Ambulatory Visit: Payer: Self-pay | Admitting: Family Medicine

## 2021-08-21 DIAGNOSIS — R918 Other nonspecific abnormal finding of lung field: Secondary | ICD-10-CM

## 2021-08-21 DIAGNOSIS — J449 Chronic obstructive pulmonary disease, unspecified: Secondary | ICD-10-CM

## 2021-08-22 ENCOUNTER — Ambulatory Visit (INDEPENDENT_AMBULATORY_CARE_PROVIDER_SITE_OTHER): Payer: Medicare Other | Admitting: Orthopaedic Surgery

## 2021-08-22 ENCOUNTER — Encounter: Payer: Self-pay | Admitting: Orthopaedic Surgery

## 2021-08-22 DIAGNOSIS — G8929 Other chronic pain: Secondary | ICD-10-CM

## 2021-08-22 DIAGNOSIS — M25561 Pain in right knee: Secondary | ICD-10-CM

## 2021-08-22 DIAGNOSIS — F17210 Nicotine dependence, cigarettes, uncomplicated: Secondary | ICD-10-CM

## 2021-08-22 MED ORDER — TRAMADOL HCL 50 MG PO TABS: ORAL_TABLET | ORAL | 3 refills | Status: DC

## 2021-08-22 NOTE — Progress Notes (Signed)
PROCEDURE NOTE:  The patient requests injections of the right knee , verbal consent was obtained.  The right knee was prepped appropriately after time out was performed.   Sterile technique was observed and injection of 1 cc of DepoMedrol '40mg'$  with several cc's of plain xylocaine. Anesthesia was provided by ethyl chloride and a 20-gauge needle was used to inject the knee area. The injection was tolerated well.  A band aid dressing was applied.  The patient was advised to apply ice later today and tomorrow to the injection sight as needed.  Encounter Diagnoses  Name Primary?   Chronic pain of right knee Yes   Cigarette nicotine dependence without complication    Return in three months.  I refilled the tramadol.  I have reviewed the [facility] web site prior to prescribing narcotic medicine for this patient.  Call if any problem.  Precautions discussed.  Electronically Signed Sanjuana Kava, MD 8/8/20232:01 PM

## 2021-08-23 ENCOUNTER — Other Ambulatory Visit: Payer: Self-pay

## 2021-08-23 DIAGNOSIS — Z853 Personal history of malignant neoplasm of breast: Secondary | ICD-10-CM

## 2021-08-24 ENCOUNTER — Other Ambulatory Visit (HOSPITAL_COMMUNITY)
Admission: RE | Admit: 2021-08-24 | Discharge: 2021-08-24 | Disposition: A | Discharge status: Home / Self Care | Payer: Medicare Other | Source: Ambulatory Visit | Attending: Family Medicine | Admitting: Family Medicine

## 2021-08-24 ENCOUNTER — Inpatient Hospital Stay: Payer: Medicare Other | Attending: Hematology

## 2021-08-24 ENCOUNTER — Other Ambulatory Visit (HOSPITAL_COMMUNITY)
Admission: RE | Admit: 2021-08-24 | Discharge: 2021-08-24 | Disposition: A | Discharge status: Home / Self Care | Payer: Medicare Other | Source: Ambulatory Visit | Attending: Hematology | Admitting: Hematology

## 2021-08-24 DIAGNOSIS — Z08 Encounter for follow-up examination after completed treatment for malignant neoplasm: Secondary | ICD-10-CM | POA: Insufficient documentation

## 2021-08-24 DIAGNOSIS — E785 Hyperlipidemia, unspecified: Secondary | ICD-10-CM | POA: Insufficient documentation

## 2021-08-24 DIAGNOSIS — Z853 Personal history of malignant neoplasm of breast: Secondary | ICD-10-CM

## 2021-08-24 DIAGNOSIS — F17210 Nicotine dependence, cigarettes, uncomplicated: Secondary | ICD-10-CM | POA: Insufficient documentation

## 2021-08-24 DIAGNOSIS — D72829 Elevated white blood cell count, unspecified: Secondary | ICD-10-CM | POA: Insufficient documentation

## 2021-08-24 DIAGNOSIS — M8580 Other specified disorders of bone density and structure, unspecified site: Secondary | ICD-10-CM | POA: Insufficient documentation

## 2021-08-24 DIAGNOSIS — D539 Nutritional anemia, unspecified: Secondary | ICD-10-CM | POA: Insufficient documentation

## 2021-08-24 LAB — LIPID PANEL
Cholesterol: 164 mg/dL (ref 0–200)
HDL: 80 mg/dL (ref >40)
LDL Cholesterol: 70 mg/dL (ref 0–99)
Total CHOL/HDL Ratio: 2.1 RATIO
Triglycerides: 70 mg/dL (ref <150)
VLDL: 14 mg/dL (ref 0–40)

## 2021-08-24 LAB — CBC WITH DIFFERENTIAL/PLATELET
Abs Immature Granulocytes: 0.1 10*3/uL — ABNORMAL HIGH (ref 0.00–0.07)
Basophils Absolute: 0.1 10*3/uL (ref 0.0–0.1)
Basophils Relative: 1 %
Eosinophils Absolute: 0.1 10*3/uL (ref 0.0–0.5)
Eosinophils Relative: 1 %
HCT: 44.9 % (ref 36.0–46.0)
Hemoglobin: 14 g/dL (ref 12.0–15.0)
Immature Granulocytes: 1 %
Lymphocytes Relative: 18 %
Lymphs Abs: 2.8 10*3/uL (ref 0.7–4.0)
MCH: 29.2 pg (ref 26.0–34.0)
MCHC: 31.2 g/dL (ref 30.0–36.0)
MCV: 93.5 fL (ref 80.0–100.0)
Monocytes Absolute: 1.7 10*3/uL — ABNORMAL HIGH (ref 0.1–1.0)
Monocytes Relative: 11 %
Neutro Abs: 10.6 10*3/uL — ABNORMAL HIGH (ref 1.7–7.7)
Neutrophils Relative %: 68 %
Platelets: 426 10*3/uL — ABNORMAL HIGH (ref 150–400)
RBC: 4.8 MIL/uL (ref 3.87–5.11)
RDW: 14.4 % (ref 11.5–15.5)
WBC: 15.3 10*3/uL — ABNORMAL HIGH (ref 4.0–10.5)
nRBC: 0 % (ref 0.0–0.2)

## 2021-08-24 LAB — COMPREHENSIVE METABOLIC PANEL
ALT: 13 U/L (ref 0–44)
ALT: 16 U/L (ref 0–44)
AST: 14 U/L — ABNORMAL LOW (ref 15–41)
AST: 16 U/L (ref 15–41)
Albumin: 4.1 g/dL (ref 3.5–5.0)
Albumin: 4.1 g/dL (ref 3.5–5.0)
Alkaline Phosphatase: 66 U/L (ref 38–126)
Alkaline Phosphatase: 67 U/L (ref 38–126)
Anion gap: 7 (ref 5–15)
Anion gap: 9 (ref 5–15)
BUN: 31 mg/dL — ABNORMAL HIGH (ref 8–23)
BUN: 31 mg/dL — ABNORMAL HIGH (ref 8–23)
CO2: 26 mmol/L (ref 22–32)
CO2: 29 mmol/L (ref 22–32)
Calcium: 9.6 mg/dL (ref 8.9–10.3)
Calcium: 9.9 mg/dL (ref 8.9–10.3)
Chloride: 107 mmol/L (ref 98–111)
Chloride: 108 mmol/L (ref 98–111)
Creatinine, Ser: 0.95 mg/dL (ref 0.44–1.00)
Creatinine, Ser: 1 mg/dL (ref 0.44–1.00)
GFR, Estimated: 58 mL/min — ABNORMAL LOW (ref >60)
GFR, Estimated: >60 mL/min (ref >60)
Glucose, Bld: 96 mg/dL (ref 70–99)
Glucose, Bld: 98 mg/dL (ref 70–99)
Potassium: 4.6 mmol/L (ref 3.5–5.1)
Potassium: 5.4 mmol/L — ABNORMAL HIGH (ref 3.5–5.1)
Sodium: 142 mmol/L (ref 135–145)
Sodium: 144 mmol/L (ref 135–145)
Total Bilirubin: 0.2 mg/dL — ABNORMAL LOW (ref 0.3–1.2)
Total Bilirubin: 0.3 mg/dL (ref 0.3–1.2)
Total Protein: 7.7 g/dL (ref 6.5–8.1)
Total Protein: 7.8 g/dL (ref 6.5–8.1)

## 2021-08-24 LAB — CBC
HCT: 44.8 % (ref 36.0–46.0)
Hemoglobin: 14.1 g/dL (ref 12.0–15.0)
MCH: 29.3 pg (ref 26.0–34.0)
MCHC: 31.5 g/dL (ref 30.0–36.0)
MCV: 93.1 fL (ref 80.0–100.0)
Platelets: 420 10*3/uL — ABNORMAL HIGH (ref 150–400)
RBC: 4.81 MIL/uL (ref 3.87–5.11)
RDW: 14.4 % (ref 11.5–15.5)
WBC: 15.2 10*3/uL — ABNORMAL HIGH (ref 4.0–10.5)
nRBC: 0 % (ref 0.0–0.2)

## 2021-08-24 LAB — IRON AND TIBC
Iron: 79 ug/dL (ref 28–170)
Saturation Ratios: 19 % (ref 10.4–31.8)
TIBC: 415 ug/dL (ref 250–450)
UIBC: 336 ug/dL

## 2021-08-24 LAB — VITAMIN B12: Vitamin B-12: 333 pg/mL (ref 180–914)

## 2021-08-24 LAB — FERRITIN: Ferritin: 33 ng/mL (ref 11–307)

## 2021-08-25 LAB — CANCER ANTIGEN 27.29: CA 27.29: 19.6 U/mL (ref 0.0–38.6)

## 2021-08-26 ENCOUNTER — Other Ambulatory Visit: Payer: Self-pay | Admitting: Internal Medicine

## 2021-08-26 LAB — CANCER ANTIGEN 15-3: CA 15-3: 19 U/mL (ref 0.0–25.0)

## 2021-08-31 ENCOUNTER — Inpatient Hospital Stay (HOSPITAL_BASED_OUTPATIENT_CLINIC_OR_DEPARTMENT_OTHER): Payer: Medicare Other | Admitting: Hematology

## 2021-08-31 VITALS — BP 123/96 | HR 97 | Temp 98.4°F | Resp 19 | Wt 169.5 lb

## 2021-08-31 DIAGNOSIS — M8580 Other specified disorders of bone density and structure, unspecified site: Secondary | ICD-10-CM | POA: Diagnosis not present

## 2021-08-31 DIAGNOSIS — Z853 Personal history of malignant neoplasm of breast: Secondary | ICD-10-CM

## 2021-08-31 DIAGNOSIS — D72829 Elevated white blood cell count, unspecified: Secondary | ICD-10-CM | POA: Diagnosis not present

## 2021-08-31 DIAGNOSIS — Z08 Encounter for follow-up examination after completed treatment for malignant neoplasm: Secondary | ICD-10-CM | POA: Diagnosis not present

## 2021-08-31 DIAGNOSIS — F17210 Nicotine dependence, cigarettes, uncomplicated: Secondary | ICD-10-CM | POA: Diagnosis not present

## 2021-08-31 DIAGNOSIS — D75839 Thrombocytosis, unspecified: Secondary | ICD-10-CM | POA: Diagnosis not present

## 2021-08-31 DIAGNOSIS — E559 Vitamin D deficiency, unspecified: Secondary | ICD-10-CM | POA: Diagnosis not present

## 2021-08-31 NOTE — Patient Instructions (Addendum)
[name] at [facility] Discharge Instructions   You were seen and examined today by Dr. Delton Coombes.  He reviewed the results of your lab work and mammogram. Your white blood cell count and platelets are elevated. All other labs were normal/stable.   Your mammogram was normal.   We will see you back in 1 year. We will repeat lab work prior to that visit.    Thank you for choosing [place] at [facility] to provide your oncology and hematology care.  To afford each patient quality time with our provider, please arrive at least 15 minutes before your scheduled appointment time.   If you have a lab appointment with the [facility] please come in thru the Main Entrance and check in at the main information desk.  You need to re-schedule your appointment should you arrive 10 or more minutes late.  We strive to give you quality time with our providers, and arriving late affects you and other patients whose appointments are after yours.  Also, if you no show three or more times for appointments you may be dismissed from the clinic at the providers discretion.     Again, thank you for choosing [facility].  Our hope is that these requests will decrease the amount of time that you wait before being seen by our physicians.       _____________________________________________________________  Should you have questions after your visit to [facility], please contact our office at [phone] and follow the prompts.  Our office hours are 8:00 a.m. and 4:30 p.m. Monday - Friday.  Please note that voicemails left after 4:00 p.m. may not be returned until the following business day.  We are closed weekends and major holidays.  You do have access to a nurse 24-7, just call the main number to the clinic [phone] and do not press any options, hold on the line and a nurse will answer the phone.    For prescription refill requests,  have your pharmacy contact our office and allow 72 hours.    Due to Covid, you will need to wear a mask upon entering the hospital. If you do not have a mask, a mask will be given to you at the Main Entrance upon arrival. For doctor visits, patients may have 1 support person age 75 or older with them. For treatment visits, patients can not have anyone with them due to social distancing guidelines and our immunocompromised population.

## 2021-08-31 NOTE — Progress Notes (Signed)
[place], [place] [address]   CLINIC:  Medical Oncology/Hematology  PCP:  [name], MD [address], [address] [place]  [address] [phone]   REASON FOR VISIT:  Follow-up for left breast cancer, leukocytosis and osteopenia  PRIOR THERAPY: Antiestrogen therapy completed.  NGS Results: Not applicable.  CURRENT THERAPY: Surveillance.  BRIEF ONCOLOGIC HISTORY:  Oncology History  Breast cancer ([place])  11/15/2004 Surgery   Lumpectomy    - 02/22/2005 Radiation Therapy     12/11/2004 -  Chemotherapy   Aromasin    Adverse Reaction   Intolerance to Aromasin    - 01/14/2010 Chemotherapy   Femara     CANCER STAGING: Cancer Staging  Breast cancer ([place]) Staging form: Breast, AJCC 7th Edition - Clinical: Stage IA (T1b, N0, cM0) - Signed by Baird Cancer, PA on 03/11/2012    INTERVAL HISTORY:  [name] 76 y.o. female returns for follow-up of breast cancer as well as osteopenia and leukocytosis.  Continues to smoke 5 to 10 cigarettes/day.  Appetite and energy levels are 100%.  No new onset pains reported.    REVIEW OF SYSTEMS:  Review of Systems  All other systems reviewed and are negative.    PAST MEDICAL/SURGICAL HISTORY:  Past Medical History:  Diagnosis Date   Breast cancer ([place]) 2007   Stage I (T1b N0 M0), grade 1 well-differentiated carcinoma of the left breast status, post lumpectomy followed by radiation therapy. Her estrogen receptor receptors were 93%, progesterone receptors 67%. HER-2/neu was negative. No lymphovascular space invasion was seen. All margins were clear. Ki-67 marker was low at 1% with surgery on 11/15/2004. Treated then with post-lumpectomy radiation, finish   Breast cancer, left breast ([place]) 2007   COPD (chronic obstructive pulmonary disease) ([place])    Diabetes mellitus without complication ([place])    diet controlled   Diverticula of colon    Hx of rickettsial disease     Hyperlipidemia    Hypertension    Kidney stones    Nicotine dependence    Osteoarthritis    Past Surgical History:  Procedure Laterality Date   ABDOMINAL HYSTERECTOMY     BREAST SURGERY  2005 approx   left lumpectomy    CATARACT EXTRACTION W/PHACO Left 05/01/2019   Procedure: CATARACT EXTRACTION PHACO AND INTRAOCULAR LENS PLACEMENT LEFT EYE CDE=11.73;  Surgeon: Baruch Goldmann, MD;  Location: [facility];  Service: Ophthalmology;  Laterality: Left;  left   CATARACT EXTRACTION W/PHACO Right 05/15/2019   Procedure: CATARACT EXTRACTION PHACO AND INTRAOCULAR LENS PLACEMENT RIGHT EYE;  Surgeon: Baruch Goldmann, MD;  Location: [facility];  Service: Ophthalmology;  Laterality: Right;  CDE: 16.07   CHOLECYSTECTOMY N/A 05/26/2014   Procedure: LAPAROSCOPIC CHOLECYSTECTOMY;  Surgeon: Aviva Signs Md, MD;  Location: [facility];  Service: General;  Laterality: N/A;   COLECTOMY     2005, diverticulitis   COLONOSCOPY N/A 05/20/2017    Surgeon: Danie Binder, MD;  five 3-6 mm polyps (4 tubular adenomas, 1 hyperplastic polyp), tortuous left colon, internal hemorrhoids.  Recommended repeat colonoscopy in 3 years.   COLONOSCOPY WITH PROPOFOL N/A 07/12/2020   Procedure: COLONOSCOPY WITH PROPOFOL;  Surgeon: Eloise Harman, DO;  Location: [facility];  Service: Endoscopy;  Laterality: N/A;  12:30pm   DILATION AND CURETTAGE OF UTERUS     ESOPHAGOGASTRODUODENOSCOPY N/A 05/20/2017   Surgeon: Danie Binder, MD; normal esophagus and mild gastritis due to aspirin.  Biopsies negative for H. pylori.   left  breast      cancer, in 2006   POLYPECTOMY  07/12/2020   Procedure: POLYPECTOMY;  Surgeon: Eloise Harman, DO;  Location: [facility];  Service: Endoscopy;;   TUBAL LIGATION       SOCIAL HISTORY:  Social History   Socioeconomic History   Marital status: Single    Spouse name: Not on file   Number of children: 5   Years of education: 9th grade    Highest education level: 9th grade  Occupational History    Occupation: retired    Fish farm manager: RETIRED  Tobacco Use   Smoking status: Every Day    Packs/day: 0.50    Years: 50.00    Total pack years: 25.00    Types: Cigarettes   Smokeless tobacco: Never   Tobacco comments:    smokes 1/2 pack per day 11/19/2019  Vaping Use   Vaping Use: Never used  Substance and Sexual Activity   Alcohol use: No    Alcohol/week: 0.0 standard drinks of alcohol   Drug use: No   Sexual activity: Not Currently  Other Topics Concern   Not on file  Social History Narrative   ** Merged History Encounter **       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 QUIT WORKING 2003 AFTER CANCER DIAGNOSIS.  MARRIED WITH A RUN AWAY HUSBAND(FUGITIVE 10 YRS). 3 MISCARRIAGES, 5 CHILDREN   Social Determinants of Health   Financial Resource Strain: Low Risk  (12/04/2019)   Overall Financial Resource Strain (CARDIA)    Difficulty of Paying Living Expenses: Not hard at all  Food Insecurity: No Food Insecurity (12/04/2019)   Hunger Vital Sign    Worried About Running Out of Food in the Last Year: Never true    Ran Out of Food in the Last Year: Never true  Transportation Needs: No Transportation Needs (12/04/2019)   PRAPARE - Hydrologist (Medical): No    Lack of Transportation (Non-Medical): No  Physical Activity: Sufficiently Active (12/04/2019)   Exercise Vital Sign    Days of Exercise per Week: 7 days    Minutes of Exercise per Session: 60 min  Stress: No Stress Concern Present (12/04/2019)   [place]    Feeling of Stress : Not at all  Social Connections: Moderately Isolated (12/04/2019)   Social Connection and Isolation Panel [NHANES]    Frequency of Communication with Friends and Family: More than three times a week    Frequency of Social Gatherings with Friends and Family: Once a week    Attends Religious Services: Never    Marine scientist or  Organizations: No    Attends Archivist Meetings: Never    Marital Status: Married  Human resources officer Violence: Not At Risk (12/04/2019)   Humiliation, Afraid, Rape, and Kick questionnaire    Fear of Current or Ex-Partner: No    Emotionally Abused: No    Physically Abused: No    Sexually Abused: No    FAMILY HISTORY:  Family History  Problem Relation Age of Onset   Breast cancer Mother    COPD Father    Emphysema Father    Throat cancer Sister    Glaucoma Brother    Schizophrenia Brother    Heart attack Brother    Lung cancer Sister        former smoker   Hepatitis C Daughter    Seizures Daughter    SIDS  Son    Colon cancer Neg Hx    Colon polyps Neg Hx     CURRENT MEDICATIONS:  Outpatient Encounter Medications as of 08/31/2021  Medication Sig   acetaminophen (TYLENOL) 325 MG tablet Take 650 mg by mouth every 6 (six) hours as needed for moderate pain.    albuterol (PROVENTIL) (2.5 MG/3ML) 0.083% nebulizer solution INHALE ONE VIAL VIA NEBULIZER THREE TIMES DAILY. (Patient taking differently: Take 2.5 mg by nebulization 3 (three) times daily as needed for wheezing or shortness of breath.)   albuterol (VENTOLIN HFA) 108 (90 Base) MCG/ACT inhaler INHALE (2) PUFFS EVERY FOURHOURS AS NEEDED ONLY IF YOU CANT CATCH YOUR BREATH   amLODipine (NORVASC) 5 MG tablet Take 1 tablet (5 mg total) by mouth daily.   aspirin EC 81 MG tablet Take 81 mg by mouth daily.   ATROVENT HFA 17 MCG/ACT inhaler INHALE 2 PUFFS BY MOUTH FOUR TIMES A DAY.   Calcium Carbonate-Vitamin D (CALCIUM 600 + D PO) Take 1 tablet by mouth 2 (two) times daily.   Cyanocobalamin (VITAMIN B 12) 500 MCG TABS Take one tablet by mouth once daily   cyclobenzaprine (FLEXERIL) 5 MG tablet TAKE ONE TABLET BY MOUTH AT BEDTIME AS NEEDED.   Emollient (VASELINE INTENSIVE CARE EX) Apply 1 application topically daily.   famotidine (PEPCID) 20 MG tablet Take 20 mg by mouth at bedtime.    fluticasone (FLONASE) 50 MCG/ACT nasal  spray PLACE 2 SPRAYS IN EACH NOSTRIL DAILY.   loratadine (CLARITIN) 10 MG tablet TAKE 1 TABLET BY MOUTH ONCE DAILY FOR ALLERGIES. (Patient taking differently: Take 10 mg by mouth daily.)   Misc. Devices MISC Please provide patient with mastectomy bra and prosthesis. Dx: History of left breast cancer   Naphazoline HCl (CLEAR EYES OP) Place 1 drop into both eyes daily as needed (irritation).   pantoprazole (PROTONIX) 40 MG tablet TAKE ONE TABLET BY MOUTH ONCE DAILY BEFORE BREAKFAST.   polyethylene glycol powder (GLYCOLAX/MIRALAX) powder MIX 1 CAPFUL IN 8 OUNCES OF JUICE OR WATER AND DRINK ONCE DAILY. (Patient taking differently: Take 17 g by mouth daily.)   potassium chloride SA (KLOR-CON) 20 MEQ tablet TAKE 1 TABLET BY MOUTH DAILY. (Patient taking differently: Take 20 mEq by mouth daily.)   rosuvastatin (CRESTOR) 10 MG tablet TAKE 1 TABLET BY MOUTH ONCE A DAY.   spironolactone (ALDACTONE) 50 MG tablet TAKE 1 TABLET BY MOUTH ONCE A DAY.   SYMBICORT 160-4.5 MCG/ACT inhaler INHALE 2 PUFFS INTO THE LUNGS TWICE DAILY.   traMADol (ULTRAM) 50 MG tablet One tablet every eight hours as needed for pain.   No facility-administered encounter medications on file as of 08/31/2021.    ALLERGIES:  Allergies  Allergen Reactions   Penicillins Hives, Shortness Of Breath and Swelling    Has patient had a PCN reaction causing immediate rash, facial/tongue/throat swelling, SOB or lightheadedness with hypotension: yes Has patient had a PCN reaction causing severe rash involving mucus membranes or skin necrosis:no Has patient had a PCN reaction that required hospitalization: yes Has patient had a PCN reaction occurring within the last 10 years: no If all of the above answers are "NO", then may proceed with Cephalosporin use.   Sulfonamide Derivatives Other (See Comments)    Shaking all over, seizure like symptoms. Hospitalization resulted    Avapro [Irbesartan] Other (See Comments)    Pt associates avapro with  bruising though aware scientifically not the case, wants to chanmge med     PHYSICAL EXAM:  ECOG Performance status:  1  Vitals:   08/31/21 1128  BP: (!) 123/96  Pulse: 97  Resp: 19  Temp: 98.4 F (36.9 C)  SpO2: 97%   Filed Weights   08/31/21 1128  Weight: 169 lb 8 oz (76.9 kg)   Physical Exam Vitals reviewed.  Constitutional:      Appearance: Normal appearance.  Cardiovascular:     Rate and Rhythm: Regular rhythm.     Heart sounds: Normal heart sounds.  Pulmonary:     Breath sounds: Normal breath sounds.  Abdominal:     Palpations: There is no mass.  Neurological:     Mental Status: She is alert.  Psychiatric:        Mood and Affect: Mood normal.        Behavior: Behavior normal.      LABORATORY DATA:  I have reviewed the labs as listed.  CBC    Component Value Date/Time   WBC 15.2 (H) 08/24/2021 1112   RBC 4.81 08/24/2021 1112   HGB 14.1 08/24/2021 1112   HGB 14.8 06/05/2021 0807   HCT 44.8 08/24/2021 1112   HCT 46.9 (H) 06/05/2021 0807   PLT 420 (H) 08/24/2021 1112   PLT 476 (H) 06/05/2021 0807   MCV 93.1 08/24/2021 1112   MCV 91 06/05/2021 0807   MCH 29.3 08/24/2021 1112   MCHC 31.5 08/24/2021 1112   RDW 14.4 08/24/2021 1112   RDW 12.9 06/05/2021 0807   LYMPHSABS 2.8 08/24/2021 1106   LYMPHSABS 2.9 02/16/2020 1456   MONOABS 1.7 (H) 08/24/2021 1106   EOSABS 0.1 08/24/2021 1106   EOSABS 0.4 02/16/2020 1456   BASOSABS 0.1 08/24/2021 1106   BASOSABS 0.1 02/16/2020 1456      Latest Ref Rng & Units 08/24/2021   11:12 AM 08/24/2021   11:06 AM 06/05/2021    8:07 AM  CMP  Glucose 70 - 99 mg/dL 98  96  87   BUN 8 - 23 mg/dL _0 Creatinine 0.44 - 1.00 mg/dL 0.95  1.00  1.00   Sodium 135 - 145 mmol/L 142  144  140   Potassium 3.5 - 5.1 mmol/L 4.6  5.4  5.8   Chloride 98 - 111 mmol/L 107  108  102   CO2 22 - 32 mmol/L _1 Calcium 8.9 - 10.3 mg/dL 9.6  9.9  11.0   Total Protein 6.5 - 8.1 g/dL 7.7  7.8  7.5   Total Bilirubin 0.3 -  1.2 mg/dL 0.3  0.2  0.5   Alkaline Phos 38 - 126 U/L 67  66  80   AST 15 - 41 U/L _2 ALT 0 - 44 U/L _3 DIAGNOSTIC IMAGING:  I have independently reviewed the scans and discussed with the patient.  ASSESSMENT:  1.  Stage I (T1 N0 M0) left breast IDC: - Diagnosis in 2006, status post lumpectomy and SLNB. - Pathology: 1 cm low-grade IDC, 0/1 lymph node positive, estrogen positive, progesterone positive, Ki-67 1%, HER2 1+. - Completed adjuvant radiation therapy around 02/22/2005. - Aromasin started on 11/2004, switched to Femara due to intolerance.  Completed 5 years of Femara on 01/14/2010.  2.  Family/social history: - Continues to smoke 5 to 10 cigarettes/day.  Started smoking at age 87. - Mother had breast cancer.  PLAN:  1.  Stage I left breast IDC: - Reviewed mammogram from 08/11/2021, BI-RADS  Category 2. - Labs from May 24, 2021 showed normal LFTs. - RTC 1 year for follow-up.  2.  Leukocytosis and thrombocytosis: - She has a leukocytosis and thrombocytosis since 2008.  No B symptoms. - Most likely smoking induced. - We will check JAK2 V617F and BCR/ABL by FISH prior to next visit.  3.  Osteopenia: - Vitamin D level was 32 in May.  Last denosumab was on 12/02/2020.  4.  Smoking history: - Lung cancer screening scan on 03/22/2021 was lung RADS 2S.      Orders placed this encounter:  Orders Placed This Encounter  Procedures   CBC with Differential   Comprehensive metabolic panel   VITAMIN D 25 Hydroxy (Vit-D Deficiency, Fractures)   Cancer antigen 15-3   Cancer antigen 27.29   Iron and TIBC ([facility] ONLY)   Ferritin   JAK2 V617F, reflex to E12-15   BCR-ABL1 FISH      Derek Jack, MD [place] [phone]

## 2021-09-01 DIAGNOSIS — E119 Type 2 diabetes mellitus without complications: Secondary | ICD-10-CM | POA: Diagnosis not present

## 2021-09-01 DIAGNOSIS — M159 Polyosteoarthritis, unspecified: Secondary | ICD-10-CM | POA: Diagnosis not present

## 2021-09-01 DIAGNOSIS — J449 Chronic obstructive pulmonary disease, unspecified: Secondary | ICD-10-CM | POA: Diagnosis not present

## 2021-09-07 DIAGNOSIS — J449 Chronic obstructive pulmonary disease, unspecified: Secondary | ICD-10-CM | POA: Diagnosis not present

## 2021-09-18 ENCOUNTER — Other Ambulatory Visit: Payer: Self-pay | Admitting: Family Medicine

## 2021-09-18 ENCOUNTER — Other Ambulatory Visit: Payer: Self-pay | Admitting: Orthopedic Surgery

## 2021-09-19 ENCOUNTER — Other Ambulatory Visit: Payer: Self-pay | Admitting: Family Medicine

## 2021-09-19 DIAGNOSIS — J449 Chronic obstructive pulmonary disease, unspecified: Secondary | ICD-10-CM

## 2021-09-19 DIAGNOSIS — R918 Other nonspecific abnormal finding of lung field: Secondary | ICD-10-CM

## 2021-09-26 ENCOUNTER — Ambulatory Visit (HOSPITAL_COMMUNITY)
Admission: RE | Admit: 2021-09-26 | Discharge: 2021-09-26 | Disposition: A | Discharge status: Home / Self Care | Payer: Medicare Other | Source: Ambulatory Visit | Attending: Internal Medicine | Admitting: Internal Medicine

## 2021-09-26 DIAGNOSIS — J449 Chronic obstructive pulmonary disease, unspecified: Secondary | ICD-10-CM | POA: Diagnosis not present

## 2021-09-26 LAB — PULMONARY FUNCTION TEST
DL/VA % pred: 106 %
DL/VA: 4.35 ml/min/mmHg/L
DLCO unc % pred: 62 %
DLCO unc: 12.37 ml/min/mmHg
FEF 25-75 Post: 0.62 L/sec
FEF 25-75 Pre: 0.48 L/sec
FEF2575-%Change-Post: 27 %
FEF2575-%Pred-Post: 36 %
FEF2575-%Pred-Pre: 28 %
FEV1-%Change-Post: 5 %
FEV1-%Pred-Post: 44 %
FEV1-%Pred-Pre: 42 %
FEV1-Post: 0.97 L
FEV1-Pre: 0.92 L
FEV1FVC-%Change-Post: -5 %
FEV1FVC-%Pred-Pre: 77 %
FEV6-%Change-Post: 5 %
FEV6-%Pred-Post: 61 %
FEV6-%Pred-Pre: 58 %
FEV6-Post: 1.69 L
FEV6-Pre: 1.6 L
FEV6FVC-%Change-Post: -5 %
FEV6FVC-%Pred-Post: 99 %
FEV6FVC-%Pred-Pre: 105 %
FVC-%Change-Post: 11 %
FVC-%Pred-Post: 61 %
FVC-%Pred-Pre: 55 %
FVC-Post: 1.78 L
FVC-Pre: 1.6 L
Post FEV1/FVC ratio: 54 %
Post FEV6/FVC ratio: 95 %
Pre FEV1/FVC ratio: 58 %
Pre FEV6/FVC Ratio: 100 %
RV % pred: 165 %
RV: 3.9 L
TLC % pred: 106 %
TLC: 5.54 L

## 2021-09-26 MED ORDER — ALBUTEROL SULFATE (2.5 MG/3ML) 0.083% IN NEBU
2.5000 mg | INHALATION_SOLUTION | Freq: Once | RESPIRATORY_TRACT | Status: AC
  Administered 2021-09-26: 2.5 mg via RESPIRATORY_TRACT

## 2021-09-29 NOTE — Progress Notes (Signed)
[facility] Quality Team Note  Name: [name] Date of Birth: [birth year] MRN: [id number] Date: 09/29/2021  [facility] Quality Team has reviewed this patient's chart, please see recommendations below:  [facility] Quality Other; (KED: Kidney Health Evaluation Gap- Patient needs Urine Albumin Creatinine Ratio Test completed for gap closure. EGFR has already been completed, Patient has upcoming appointment with [facility] 10/04/2021.)

## 2021-10-02 DIAGNOSIS — E119 Type 2 diabetes mellitus without complications: Secondary | ICD-10-CM | POA: Diagnosis not present

## 2021-10-02 DIAGNOSIS — J449 Chronic obstructive pulmonary disease, unspecified: Secondary | ICD-10-CM | POA: Diagnosis not present

## 2021-10-02 DIAGNOSIS — M159 Polyosteoarthritis, unspecified: Secondary | ICD-10-CM | POA: Diagnosis not present

## 2021-10-03 IMAGING — US US BREAST*R* LIMITED INC AXILLA
1 series · 13 of 25 positions shown · non-contrast
Comparison: Previous exam(s).

CLINICAL DATA: Patient returns after screening study for evaluation
possible RIGHT breast asymmetry.

EXAM:
DIGITAL DIAGNOSTIC UNILATERAL RIGHT MAMMOGRAM WITH TOMOSYNTHESIS AND
CAD; ULTRASOUND RIGHT BREAST LIMITED
TECHNIQUE: Right digital diagnostic mammography and breast tomosynthesis was
performed. The images were evaluated with computer-aided detection.;
Targeted ultrasound examination of the right breast was performed

[Series 1: us breast*right* limited inc axilla · 0.07mm/px · 13 of 25 slices shown]
[im 1/25]
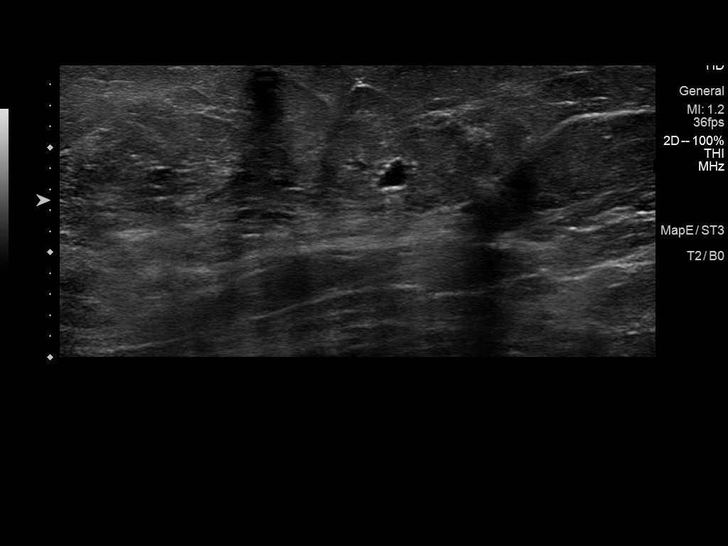
[im 3/25]
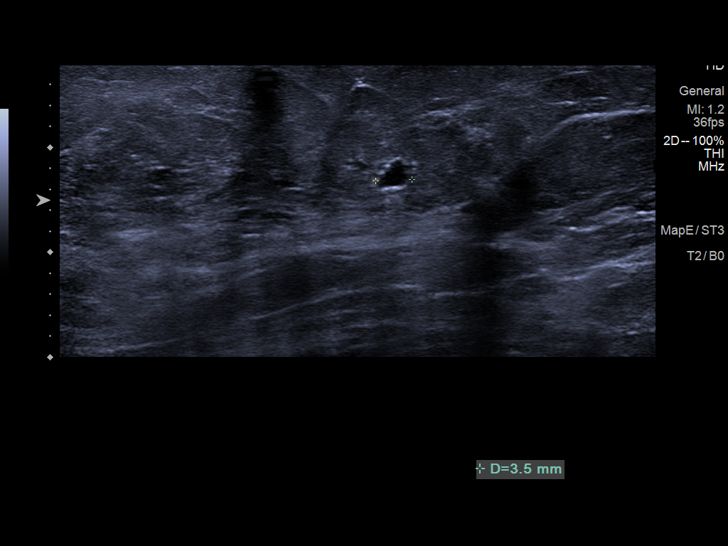
[im 5/25]
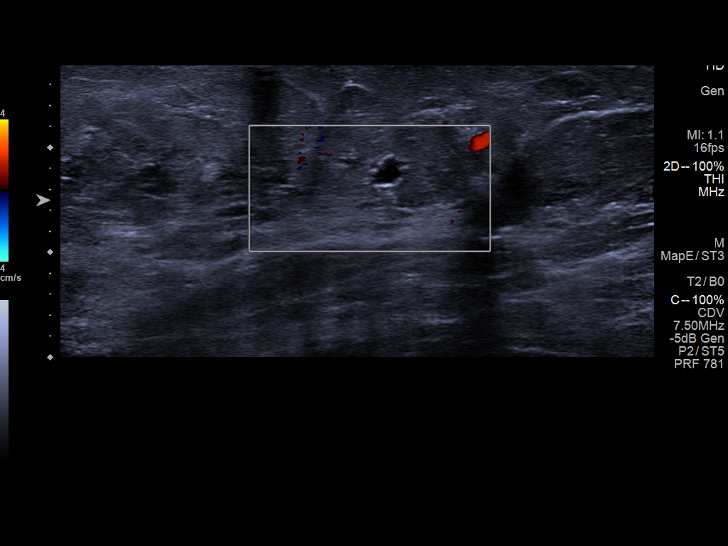
[im 7/25]
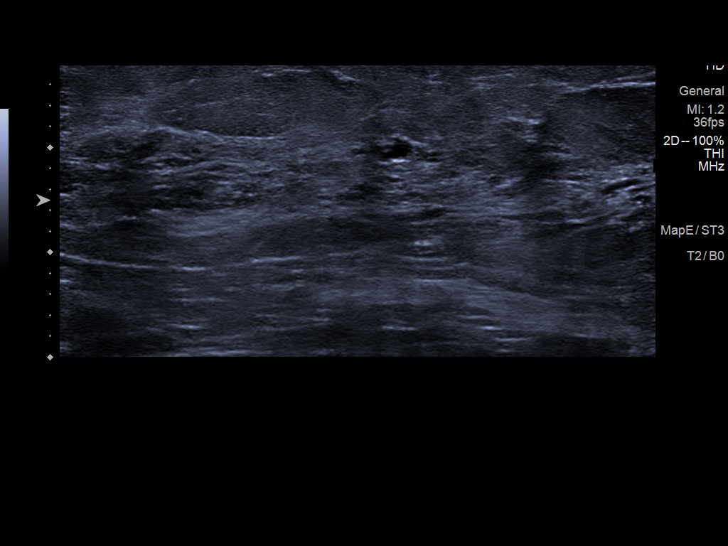
[im 9/25]
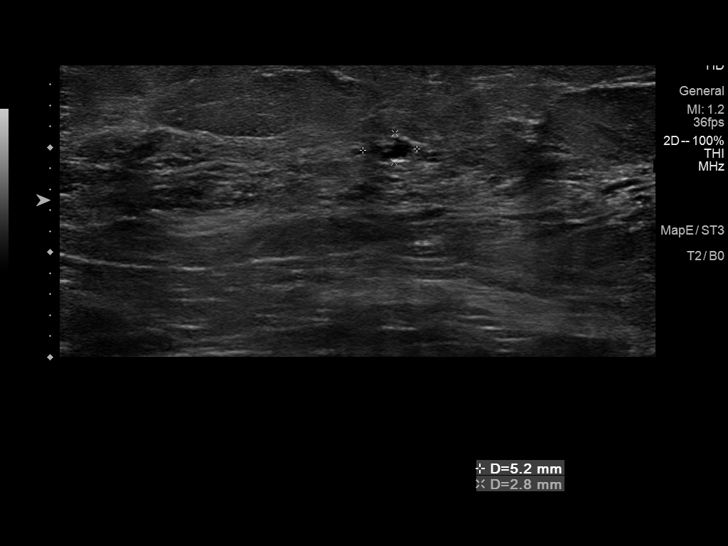
[im 11/25]
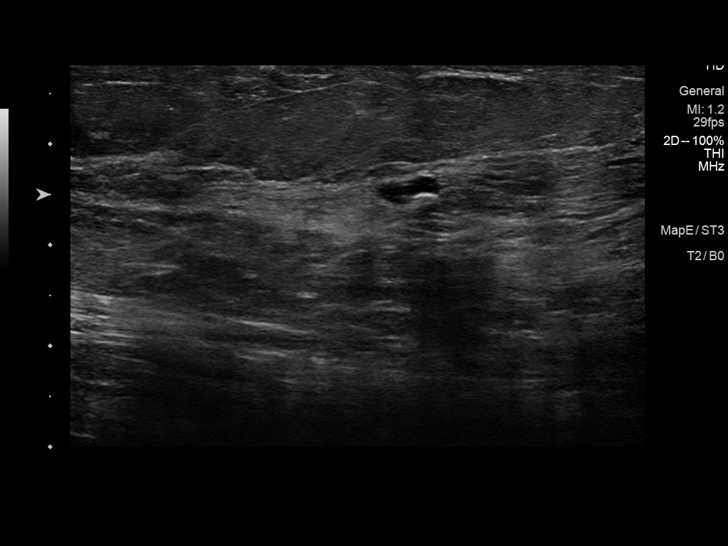
[im 13/25]
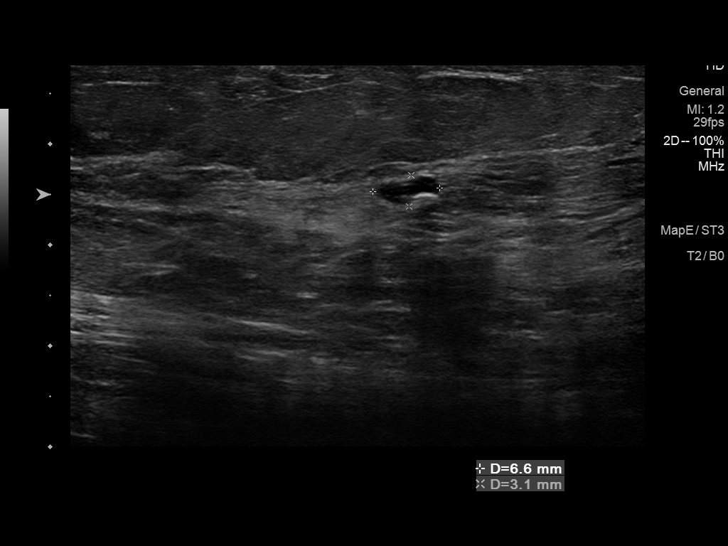
[im 15/25]
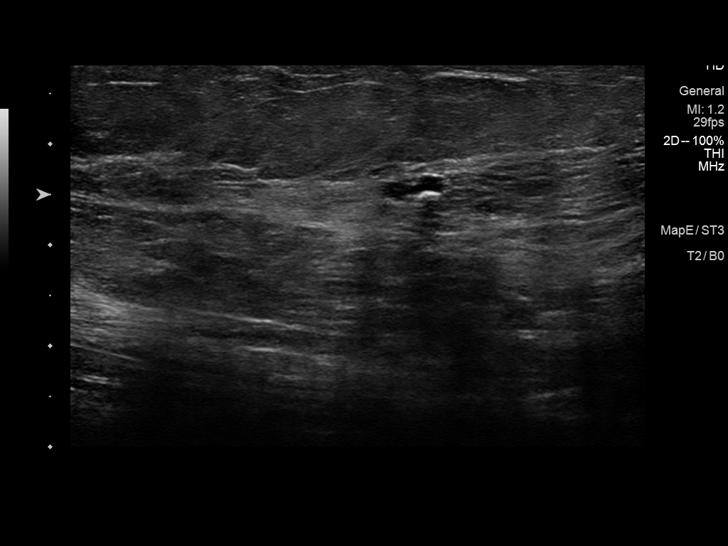
[im 17/25]
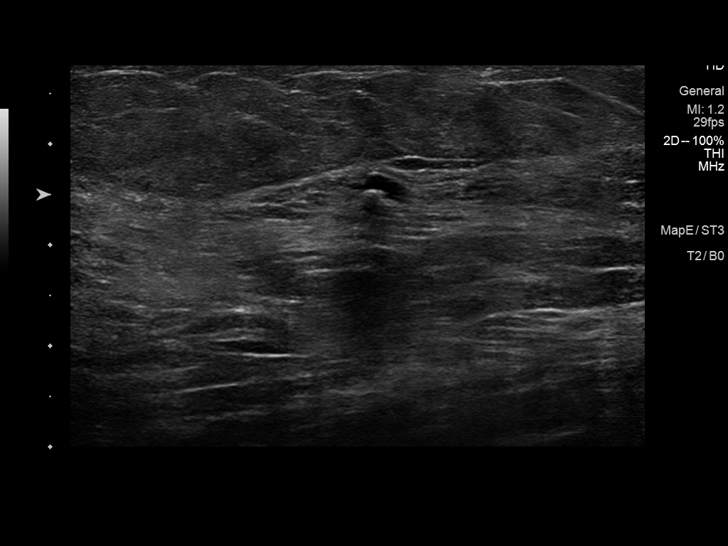
[im 19/25]
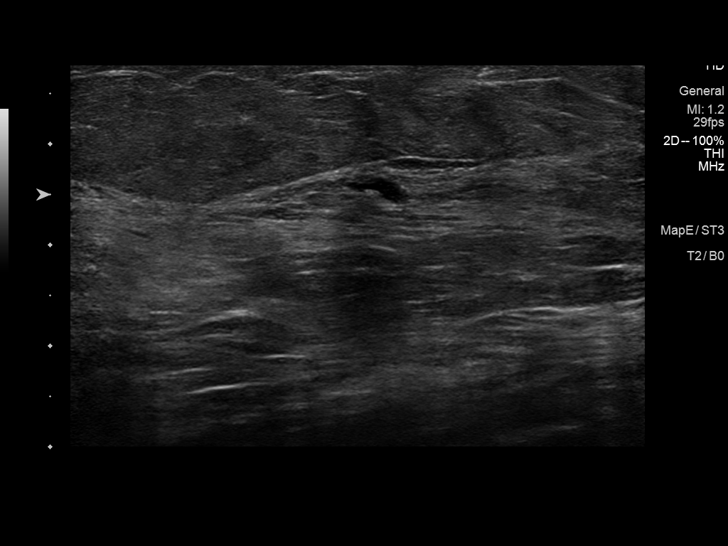
[im 21/25]
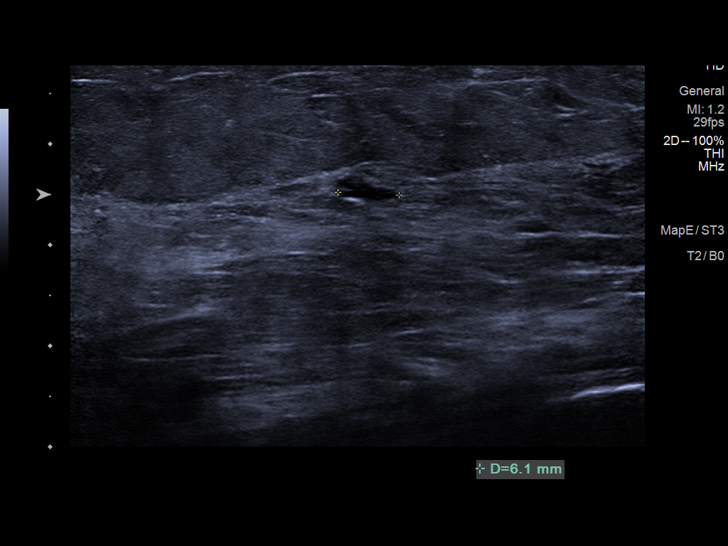
[im 23/25]
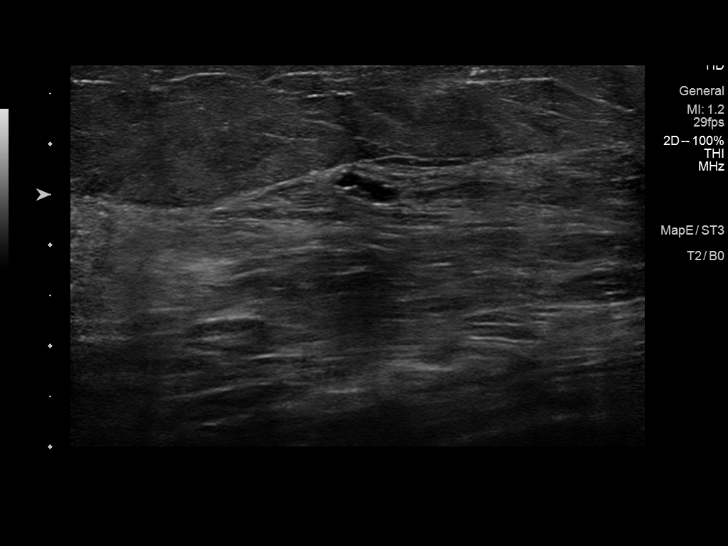
[im 25/25]
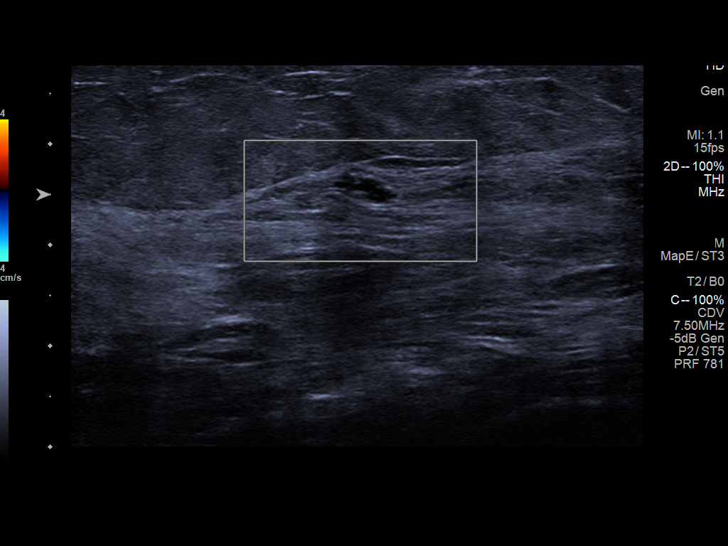

[13 of 25 positions shown; findings below may reference images not displayed]

ACR Breast Density Category b: There are scattered areas of
fibroglandular density.
FINDINGS: Additional 2-D and 3-D images are performed. These views confirm
presence of an oval mass in the UPPER central of the RIGHT breast,
middle depth.

On physical exam, I palpate no abnormality in the UPPER central
RIGHT breast.

Targeted ultrasound is performed, showing a bilobed cyst in the 12
o'clock location of the RIGHT breast 3 centimeters from nipple
measuring 0.7 x 0.3 x 0.6 centimeters. No internal blood flow is
identified on Doppler evaluation. Scattered sub centimeter cysts are
also identified in the central portion of the RIGHT breast.
IMPRESSION: Benign cyst in the RIGHT breast. No mammographic or ultrasound
evidence for malignancy.

RECOMMENDATION:
Screening mammogram in one year.(Code:[id number])

I have discussed the findings and recommendations with the patient.
If applicable, a reminder letter will be sent to the patient
regarding the next appointment.

BI-RADS CATEGORY  2: Benign.

## 2021-10-04 ENCOUNTER — Ambulatory Visit: Payer: Medicare Other

## 2021-10-08 DIAGNOSIS — J449 Chronic obstructive pulmonary disease, unspecified: Secondary | ICD-10-CM | POA: Diagnosis not present

## 2021-10-11 ENCOUNTER — Ambulatory Visit (INDEPENDENT_AMBULATORY_CARE_PROVIDER_SITE_OTHER): Payer: Medicare Other

## 2021-10-11 DIAGNOSIS — Z23 Encounter for immunization: Secondary | ICD-10-CM | POA: Diagnosis not present

## 2021-10-18 ENCOUNTER — Other Ambulatory Visit: Payer: Self-pay | Admitting: Family Medicine

## 2021-10-18 DIAGNOSIS — R918 Other nonspecific abnormal finding of lung field: Secondary | ICD-10-CM

## 2021-10-18 DIAGNOSIS — J4489 Other specified chronic obstructive pulmonary disease: Secondary | ICD-10-CM

## 2021-10-24 ENCOUNTER — Other Ambulatory Visit: Payer: Self-pay | Admitting: Orthopedic Surgery

## 2021-10-26 DIAGNOSIS — Z85828 Personal history of other malignant neoplasm of skin: Secondary | ICD-10-CM | POA: Diagnosis not present

## 2021-10-26 DIAGNOSIS — X32XXXD Exposure to sunlight, subsequent encounter: Secondary | ICD-10-CM | POA: Diagnosis not present

## 2021-10-26 DIAGNOSIS — L570 Actinic keratosis: Secondary | ICD-10-CM | POA: Diagnosis not present

## 2021-10-26 DIAGNOSIS — Z08 Encounter for follow-up examination after completed treatment for malignant neoplasm: Secondary | ICD-10-CM | POA: Diagnosis not present

## 2021-10-31 DIAGNOSIS — J449 Chronic obstructive pulmonary disease, unspecified: Secondary | ICD-10-CM | POA: Diagnosis not present

## 2021-10-31 DIAGNOSIS — E119 Type 2 diabetes mellitus without complications: Secondary | ICD-10-CM | POA: Diagnosis not present

## 2021-10-31 DIAGNOSIS — M159 Polyosteoarthritis, unspecified: Secondary | ICD-10-CM | POA: Diagnosis not present

## 2021-11-07 ENCOUNTER — Other Ambulatory Visit: Payer: Self-pay

## 2021-11-07 ENCOUNTER — Ambulatory Visit (INDEPENDENT_AMBULATORY_CARE_PROVIDER_SITE_OTHER): Payer: 59 | Admitting: Family Medicine

## 2021-11-07 ENCOUNTER — Telehealth: Payer: Self-pay | Admitting: Family Medicine

## 2021-11-07 ENCOUNTER — Encounter: Payer: Self-pay | Admitting: Family Medicine

## 2021-11-07 VITALS — BP 176/110 | HR 48 | Temp 98.4°F | Resp 14 | Ht 65.0 in | Wt 164.0 lb

## 2021-11-07 DIAGNOSIS — J209 Acute bronchitis, unspecified: Secondary | ICD-10-CM | POA: Diagnosis not present

## 2021-11-07 DIAGNOSIS — R051 Acute cough: Secondary | ICD-10-CM

## 2021-11-07 DIAGNOSIS — Z20828 Contact with and (suspected) exposure to other viral communicable diseases: Secondary | ICD-10-CM

## 2021-11-07 DIAGNOSIS — J440 Chronic obstructive pulmonary disease with acute lower respiratory infection: Secondary | ICD-10-CM

## 2021-11-07 DIAGNOSIS — M5410 Radiculopathy, site unspecified: Secondary | ICD-10-CM | POA: Diagnosis not present

## 2021-11-07 DIAGNOSIS — J449 Chronic obstructive pulmonary disease, unspecified: Secondary | ICD-10-CM | POA: Diagnosis not present

## 2021-11-07 DIAGNOSIS — I10 Essential (primary) hypertension: Secondary | ICD-10-CM | POA: Diagnosis not present

## 2021-11-07 DIAGNOSIS — F17210 Nicotine dependence, cigarettes, uncomplicated: Secondary | ICD-10-CM | POA: Diagnosis not present

## 2021-11-07 LAB — POCT INFLUENZA A/B
Influenza A, POC: NEGATIVE
Influenza B, POC: NEGATIVE

## 2021-11-07 MED ORDER — DOXYCYCLINE HYCLATE 100 MG PO TABS: 100.0000 mg | ORAL_TABLET | Freq: Two times a day (BID) | ORAL | 0 refills | Status: DC

## 2021-11-07 MED ORDER — BENZONATATE 100 MG PO CAPS: 100.0000 mg | ORAL_CAPSULE | Freq: Two times a day (BID) | ORAL | 0 refills | Status: DC | PRN

## 2021-11-07 NOTE — Patient Instructions (Addendum)
F/U in office with all meds you are taking in 2 weeks, blood pressure unusually high today, call if yo need me sooner  Flu test is negative  cXR and X ray of lower back today  Doxycycline and Tessalon Perles are prescribed for cough. , sputum to be collected outside of building and submitted for c/s pleasse  Please plan to quit smoking , you dO NEED to stop to protect your lungs  Thanks for choosing [facility], we consider it a privelige to serve you.

## 2021-11-07 NOTE — Progress Notes (Signed)
   [name]     MRN: [id number]      DOB: 01/19/1945   HPI [name] is here 1 week h/o generalized aches, fever, cough productive of green sputum, blowing yellow green drainage from nostrils. Grand daughter positive for flu, frontal headache Lower back pain radiating down thigh to knee  x 1 week  ROS Denies recent fever . Denies chest pains, palpitations and leg swelling Denies abdominal pain, nausea, vomiting,diarrhea or constipation.   Denies dysuria, frequency, hesitancy or incontinence. . Denies headaches, seizures, numbness, or tingling. Denies depression, anxiety or insomnia. Denies skin break down or rash.   PE  BP (!) 176/110   Pulse (!) 48   Resp 14   Ht '5\' 5"'$  (1.651 m)   Wt 164 lb 0.6 oz (74.4 kg)   SpO2 93%   BMI 27.30 kg/m   Patient alert and oriented  HEENT: No facial asymmetry, EOMI,     Neck supple . No sinus tenderness Chest: decreased air entry, bilateral crackles no wheezes CVS: S1, S2 , no S3.Regular rate.  ABD: Soft non tender.   Ext: No edema  MS: decreased  ROM spine, shoulders, hips and knees.  Skin: Intact, no ulcerations or rash noted.  Psych: Good eye contact, normal affect. Memory intact not anxious or depressed appearing.  CNS: CN 2-12 intact, power,  normal throughout.no focal deficits noted.   Assessment & Plan  Acute bronchitis with COPD ([place]) 1 week history, CXR, doxycycline and tessalon perle, sputum for c/s  Back pain with left-sided radiculopathy Pain increased and uncontrolled, obtain X ray and to be followed by Ortho  Cigarette smoker Asked:confirms currently smokes cigarettes Assess: Unwilling to set a quit date, but is cutting back Advise: needs to QUIT to reduce risk of cancer, cardio and cerebrovascular disease Assist: counseled for 5 minutes and literature provided Arrange: follow up in 2 to 4 months   HTN (hypertension) Markedly elevated at visit pt called back to report that she had not taken one of  her BP meds. Will re evaluate in 3 weeks meds to be brought to office  Cough Cough and respiratory symptoms with known sick contact, flu test negative

## 2021-11-07 NOTE — Telephone Encounter (Signed)
Patient called back with FYI   The only meds patient took this morning was   acetaminophen (TYLENOL) 325 MG tablet   traMADol (ULTRAM) 50 MG tablet   amLODipine (NORVASC) 5 MG tablet   And allergy medicine

## 2021-11-07 NOTE — Assessment & Plan Note (Addendum)
1 week history, CXR, doxycycline and tessalon perle, sputum for c/s

## 2021-11-09 ENCOUNTER — Ambulatory Visit (HOSPITAL_COMMUNITY)
Admission: RE | Admit: 2021-11-09 | Discharge: 2021-11-09 | Disposition: A | Discharge status: Home / Self Care | Payer: Medicare Other | Source: Ambulatory Visit | Attending: Family Medicine | Admitting: Family Medicine

## 2021-11-09 DIAGNOSIS — J440 Chronic obstructive pulmonary disease with acute lower respiratory infection: Secondary | ICD-10-CM | POA: Diagnosis not present

## 2021-11-09 DIAGNOSIS — R059 Cough, unspecified: Secondary | ICD-10-CM | POA: Diagnosis not present

## 2021-11-09 DIAGNOSIS — M545 Low back pain, unspecified: Secondary | ICD-10-CM | POA: Diagnosis not present

## 2021-11-09 DIAGNOSIS — J209 Acute bronchitis, unspecified: Secondary | ICD-10-CM | POA: Insufficient documentation

## 2021-11-09 DIAGNOSIS — M5410 Radiculopathy, site unspecified: Secondary | ICD-10-CM | POA: Insufficient documentation

## 2021-11-09 LAB — NOVEL CORONAVIRUS, NAA: SARS-CoV-2, NAA: NOT DETECTED

## 2021-11-09 NOTE — Telephone Encounter (Signed)
LMTRC

## 2021-11-10 ENCOUNTER — Telehealth: Payer: Self-pay | Admitting: Family Medicine

## 2021-11-10 NOTE — Telephone Encounter (Signed)
Called patient and left message for them to return call at the office   

## 2021-11-10 NOTE — Telephone Encounter (Signed)
Pt aware.

## 2021-11-10 NOTE — Telephone Encounter (Signed)
Patient returning call.

## 2021-11-11 LAB — GRAM STAIN W/SPUTUM CULT RFLX: White Blood Cells: NONE SEEN

## 2021-11-13 ENCOUNTER — Emergency Department (HOSPITAL_COMMUNITY): Payer: Medicare Other

## 2021-11-13 ENCOUNTER — Encounter (HOSPITAL_COMMUNITY): Payer: Self-pay

## 2021-11-13 ENCOUNTER — Encounter: Payer: Self-pay | Admitting: Family Medicine

## 2021-11-13 ENCOUNTER — Emergency Department (HOSPITAL_COMMUNITY)
Admission: EM | Admit: 2021-11-13 | Discharge: 2021-11-13 | Disposition: A | Discharge status: Home / Self Care | Payer: Medicare Other | Attending: Emergency Medicine | Admitting: Emergency Medicine

## 2021-11-13 ENCOUNTER — Other Ambulatory Visit: Payer: Self-pay

## 2021-11-13 DIAGNOSIS — Z79899 Other long term (current) drug therapy: Secondary | ICD-10-CM | POA: Diagnosis not present

## 2021-11-13 DIAGNOSIS — M79652 Pain in left thigh: Secondary | ICD-10-CM | POA: Diagnosis not present

## 2021-11-13 DIAGNOSIS — I10 Essential (primary) hypertension: Secondary | ICD-10-CM | POA: Insufficient documentation

## 2021-11-13 DIAGNOSIS — Z7951 Long term (current) use of inhaled steroids: Secondary | ICD-10-CM | POA: Insufficient documentation

## 2021-11-13 DIAGNOSIS — Z87442 Personal history of urinary calculi: Secondary | ICD-10-CM | POA: Insufficient documentation

## 2021-11-13 DIAGNOSIS — E119 Type 2 diabetes mellitus without complications: Secondary | ICD-10-CM | POA: Insufficient documentation

## 2021-11-13 DIAGNOSIS — J449 Chronic obstructive pulmonary disease, unspecified: Secondary | ICD-10-CM | POA: Diagnosis not present

## 2021-11-13 DIAGNOSIS — Z7982 Long term (current) use of aspirin: Secondary | ICD-10-CM | POA: Insufficient documentation

## 2021-11-13 DIAGNOSIS — Z853 Personal history of malignant neoplasm of breast: Secondary | ICD-10-CM | POA: Insufficient documentation

## 2021-11-13 DIAGNOSIS — S76312A Strain of muscle, fascia and tendon of the posterior muscle group at thigh level, left thigh, initial encounter: Secondary | ICD-10-CM | POA: Insufficient documentation

## 2021-11-13 DIAGNOSIS — M5410 Radiculopathy, site unspecified: Secondary | ICD-10-CM | POA: Insufficient documentation

## 2021-11-13 DIAGNOSIS — R059 Cough, unspecified: Secondary | ICD-10-CM | POA: Insufficient documentation

## 2021-11-13 DIAGNOSIS — X58XXXA Exposure to other specified factors, initial encounter: Secondary | ICD-10-CM | POA: Diagnosis not present

## 2021-11-13 MED ORDER — LIDOCAINE 5 % EX PTCH
2.0000 | MEDICATED_PATCH | CUTANEOUS | Status: DC
  Administered 2021-11-13: 2 via TRANSDERMAL
  Filled 2021-11-13: qty 2

## 2021-11-13 MED ORDER — LIDOCAINE 4 % EX PTCH: 1.0000 | MEDICATED_PATCH | CUTANEOUS | 0 refills | Status: DC

## 2021-11-13 MED ORDER — DICLOFENAC SODIUM 1 % EX GEL: 2.0000 g | Freq: Four times a day (QID) | CUTANEOUS | 0 refills | Status: DC

## 2021-11-13 NOTE — Assessment & Plan Note (Signed)
Pain increased and uncontrolled, obtain X ray and to be followed by Ortho

## 2021-11-13 NOTE — Assessment & Plan Note (Signed)
Markedly elevated at visit pt called back to report that she had not taken one of her BP meds. Will re evaluate in 3 weeks meds to be brought to office

## 2021-11-13 NOTE — ED Provider Notes (Signed)
[facility] EMERGENCY DEPARTMENT Provider Note   CSN: [id number] Arrival date & time: 11/13/21  1111     History {Add pertinent medical, surgical, social history, OB history to HPI:1} Chief Complaint  Patient presents with   Leg Pain    GENEAN ADAMSKI is a 76 y.o. female.  Woke up with posterior thigh pain last Thursday in muscles doesn't remember injury still having pain so wanted to come in for eval No numbness or weakness of her foot  Got x-ray last Friday of her hip without acute findings No bowel or bladder incont or weak or numbness No back pain       Home Medications Prior to Admission medications   Medication Sig Start Date End Date Taking? Authorizing Provider  acetaminophen (TYLENOL) 325 MG tablet Take 650 mg by mouth every 6 (six) hours as needed for moderate pain.     [provider]  albuterol (PROVENTIL) (2.5 MG/3ML) 0.083% nebulizer solution INHALE ONE VIAL VIA NEBULIZER THREE TIMES DAILY. Patient taking differently: Take 2.5 mg by nebulization 3 (three) times daily as needed for wheezing or shortness of breath. 07/08/18   Perlie Mayo, NP  albuterol (VENTOLIN HFA) 108 (90 Base) MCG/ACT inhaler INHALE (2) PUFFS EVERY FOURHOURS AS NEEDED ONLY IF YOU CANT CATCH YOUR BREATH 05/12/21   Lindell Spar, MD  amLODipine (NORVASC) 5 MG tablet TAKE (1) TABLET BY MOUOTH ONCE DAILY. STOP IRBESARTAN 06/21/2021. 09/19/21   [name], MD  aspirin EC 81 MG tablet Take 81 mg by mouth daily.    [provider]  ATROVENT HFA 17 MCG/ACT inhaler INHALE 2 PUFFS BY MOUTH FOUR TIMES A DAY. 10/18/21   [name], MD  benzonatate (TESSALON) 100 MG capsule Take 1 capsule (100 mg total) by mouth 2 (two) times daily as needed for cough. 11/07/21   [name], MD  Calcium Carbonate-Vitamin D (CALCIUM 600 + D PO) Take 1 tablet by mouth 2 (two) times daily.    [provider]  Cyanocobalamin (VITAMIN B 12) 500 MCG TABS Take one tablet by  mouth once daily 08/18/20   [name], MD  cyclobenzaprine (FLEXERIL) 5 MG tablet TAKE ONE TABLET BY MOUTH AT BEDTIME AS NEEDED. 10/24/21   Carole Civil, MD  doxycycline (VIBRA-TABS) 100 MG tablet Take 1 tablet (100 mg total) by mouth 2 (two) times daily. 11/07/21   [name], MD  Emollient (VASELINE INTENSIVE CARE EX) Apply 1 application topically daily.    [provider]  famotidine (PEPCID) 20 MG tablet Take 20 mg by mouth at bedtime.  09/04/18   [provider]  fluticasone (FLONASE) 50 MCG/ACT nasal spray PLACE 2 SPRAYS IN EACH NOSTRIL DAILY. 10/18/21   [name], MD  loratadine (CLARITIN) 10 MG tablet TAKE 1 TABLET BY MOUTH ONCE DAILY FOR ALLERGIES. Patient taking differently: Take 10 mg by mouth daily. 10/22/19   Perlie Mayo, NP  Misc. Devices MISC Please provide patient with mastectomy bra and prosthesis. Dx: History of left breast cancer 08/25/20   Derek Jack, MD  montelukast (SINGULAIR) 10 MG tablet TAKE 1 TABLET BY MOUTH AT BEDTIME. 09/19/21   [name], MD  Naphazoline HCl (CLEAR EYES OP) Place 1 drop into both eyes daily as needed (irritation).    [provider]  pantoprazole (PROTONIX) 40 MG tablet TAKE ONE TABLET BY MOUTH ONCE DAILY BEFORE BREAKFAST. 02/01/21   Tanda Rockers, MD  polyethylene glycol powder (GLYCOLAX/MIRALAX) powder MIX  1 CAPFUL IN 8 OUNCES OF JUICE OR WATER AND DRINK ONCE DAILY. Patient taking differently: Take 17 g by mouth daily. 04/23/16   [name], MD  potassium chloride SA (KLOR-CON) 20 MEQ tablet TAKE 1 TABLET BY MOUTH DAILY. Patient taking differently: Take 20 mEq by mouth daily. 05/13/20   [name], MD  rosuvastatin (CRESTOR) 10 MG tablet TAKE 1 TABLET BY MOUTH ONCE A DAY. 09/19/21   [name], MD  spironolactone (ALDACTONE) 50 MG tablet TAKE 1 TABLET BY MOUTH ONCE A DAY. 10/18/21   [name], MD  SYMBICORT 160-4.5 MCG/ACT inhaler INHALE 2  PUFFS INTO THE LUNGS TWICE DAILY. 10/18/21   [name], MD  traMADol (ULTRAM) 50 MG tablet One tablet every eight hours as needed for pain. 08/22/21   Sanjuana Kava, MD      Allergies    Penicillins, Sulfonamide derivatives, and Avapro [irbesartan]    Review of Systems   Review of Systems  Physical Exam Updated Vital Signs BP (!) 161/116 (BP Location: Right Arm)   Pulse 95   Temp 98.6 F (37 C) (Oral)   Resp 20   Ht '5\' 5"'$  (1.651 m)   Wt 73.9 kg   SpO2 97%   BMI 27.12 kg/m  Physical Exam  ED Results / Procedures / Treatments   Labs (all labs ordered are listed, but only abnormal results are displayed) Labs Reviewed - No data to display  EKG None  Radiology No results found.  Procedures Procedures  {Document cardiac monitor, telemetry assessment procedure when appropriate:1}  Medications Ordered in ED Medications - No data to display  ED Course/ Medical Decision Making/ A&P                           Medical Decision Making  ***  {Document critical care time when appropriate:1} {Document review of labs and clinical decision tools ie heart score, Chads2Vasc2 etc:1}  {Document your independent review of radiology images, and any outside records:1} {Document your discussion with family members, caretakers, and with consultants:1} {Document social determinants of health affecting pt's care:1} {Document your decision making why or why not admission, treatments were needed:1} Final Clinical Impression(s) / ED Diagnoses Final diagnoses:  None    Rx / DC Orders ED Discharge Orders     None

## 2021-11-13 NOTE — Assessment & Plan Note (Signed)
Asked:confirms currently smokes cigarettes °Assess: Unwilling to set a quit date, but is cutting back °Advise: needs to QUIT to reduce risk of cancer, cardio and cerebrovascular disease °Assist: counseled for 5 minutes and literature provided °Arrange: follow up in 2 to 4 months ° °

## 2021-11-13 NOTE — ED Triage Notes (Addendum)
Pt presents to ED with complaints of left upper leg pain since last Thursday. Pt states she went to PCP and hip xray. Pt states hurts to stand.

## 2021-11-13 NOTE — Assessment & Plan Note (Signed)
Cough and respiratory symptoms with known sick contact, flu test negative

## 2021-11-13 NOTE — Discharge Instructions (Addendum)
Today you were seen in the emergency department for your leg pain.    In the emergency department you had x-rays that did not reveal any abnormality.    At home, please use Tylenol, lidocaine patches, and Voltaren gel we have prescribed you as needed for your pain.    Follow-up with your primary doctor in 2-3 days regarding your visit.  Follow-up with sports medicine as soon as possible about your symptoms.  Return immediately to the emergency department if you experience any of the following: Worsening pain, numbness or weakness of your foot, or any other concerning symptoms.    Thank you for visiting our Emergency Department. It was a pleasure taking care of you today.

## 2021-11-14 DIAGNOSIS — J449 Chronic obstructive pulmonary disease, unspecified: Secondary | ICD-10-CM | POA: Diagnosis not present

## 2021-11-20 ENCOUNTER — Other Ambulatory Visit: Payer: Self-pay | Admitting: Family Medicine

## 2021-11-20 DIAGNOSIS — R918 Other nonspecific abnormal finding of lung field: Secondary | ICD-10-CM

## 2021-11-20 DIAGNOSIS — J4489 Other specified chronic obstructive pulmonary disease: Secondary | ICD-10-CM

## 2021-11-21 ENCOUNTER — Encounter: Payer: Self-pay | Admitting: Orthopaedic Surgery

## 2021-11-21 ENCOUNTER — Ambulatory Visit (INDEPENDENT_AMBULATORY_CARE_PROVIDER_SITE_OTHER): Payer: Medicare Other | Admitting: Orthopaedic Surgery

## 2021-11-21 VITALS — BP 177/96 | HR 98 | Ht 65.0 in | Wt 163.0 lb

## 2021-11-21 DIAGNOSIS — S76302A Unspecified injury of muscle, fascia and tendon of the posterior muscle group at thigh level, left thigh, initial encounter: Secondary | ICD-10-CM

## 2021-11-21 MED ORDER — TRAMADOL HCL 50 MG PO TABS: ORAL_TABLET | ORAL | 3 refills | Status: DC

## 2021-11-21 NOTE — Progress Notes (Signed)
My left hurts.  She had pain of the left hamstring area about a week ago.  She went to the ER on 11-13-21.  I have reviewed the notes and X-rays.  She was told she had a hamstring strain.  She is a little better but still hurts.  She has problems with stairs and standing a while.  She denies any trauma.  She is out of pain medicine.  Left lateral hamstring is tender with no defect felt.  She has no swelling, no redness, no bruising.  NV intact.  ROM of left knee is tender, 0 to 105, limp left, NV intact.  Encounter Diagnosis  Name Primary?   Hamstring injury, left, initial encounter Yes   I will call in pain medicine.  I have reviewed the [place] web site prior to prescribing narcotic medicine for this patient.  Use ice or heat and rubs to area.  Return in two weeks.  Call if any problem.  Precautions discussed.  Electronically Signed Sanjuana Kava, MD 11/7/20232:00 PM

## 2021-11-21 NOTE — Patient Instructions (Signed)
Use Aspercreme, Biofreeze or Voltaren gel over the counter 2-3 times daily make sure you rub it in well each time you use it.  Use ice for pain relief if you want to switch to Heat that is fine but ice will help more

## 2021-11-23 ENCOUNTER — Ambulatory Visit (INDEPENDENT_AMBULATORY_CARE_PROVIDER_SITE_OTHER): Payer: 59 | Admitting: Family Medicine

## 2021-11-23 ENCOUNTER — Encounter: Payer: Self-pay | Admitting: Family Medicine

## 2021-11-23 VITALS — BP 160/70 | HR 49 | Ht 65.0 in | Wt 164.0 lb

## 2021-11-23 DIAGNOSIS — Z87891 Personal history of nicotine dependence: Secondary | ICD-10-CM | POA: Diagnosis not present

## 2021-11-23 DIAGNOSIS — J209 Acute bronchitis, unspecified: Secondary | ICD-10-CM | POA: Diagnosis not present

## 2021-11-23 DIAGNOSIS — F17210 Nicotine dependence, cigarettes, uncomplicated: Secondary | ICD-10-CM

## 2021-11-23 DIAGNOSIS — J4489 Other specified chronic obstructive pulmonary disease: Secondary | ICD-10-CM | POA: Diagnosis not present

## 2021-11-23 DIAGNOSIS — I10 Essential (primary) hypertension: Secondary | ICD-10-CM

## 2021-11-23 DIAGNOSIS — J440 Chronic obstructive pulmonary disease with acute lower respiratory infection: Secondary | ICD-10-CM | POA: Diagnosis not present

## 2021-11-23 DIAGNOSIS — R600 Localized edema: Secondary | ICD-10-CM | POA: Diagnosis not present

## 2021-11-23 NOTE — Patient Instructions (Addendum)
F/u in 8 weeks re eval blood pressure, call if you need me sooner  Blood pressure is still too high, I will adjust medication after I review labs today, so you will be contacted about your labs  Need to quit smoking to help blood pressure and overall health  Labs ordered 10/24 to be drawn today  Thanks for choosing [facility], we consider it a privelige to serve you.

## 2021-11-24 LAB — CBC WITH DIFFERENTIAL/PLATELET
Basophils Absolute: 0.1 10*3/uL (ref 0.0–0.2)
Basos: 1 %
EOS (ABSOLUTE): 0.3 10*3/uL (ref 0.0–0.4)
Eos: 2 %
Hematocrit: 44.7 % (ref 34.0–46.6)
Hemoglobin: 14.6 g/dL (ref 11.1–15.9)
Immature Grans (Abs): 0 10*3/uL (ref 0.0–0.1)
Immature Granulocytes: 0 %
Lymphocytes Absolute: 2.8 10*3/uL (ref 0.7–3.1)
Lymphs: 21 %
MCH: 28.9 pg (ref 26.6–33.0)
MCHC: 32.7 g/dL (ref 31.5–35.7)
MCV: 88 fL (ref 79–97)
Monocytes Absolute: 1.6 10*3/uL — ABNORMAL HIGH (ref 0.1–0.9)
Monocytes: 12 %
Neutrophils Absolute: 8.5 10*3/uL — ABNORMAL HIGH (ref 1.4–7.0)
Neutrophils: 64 %
Platelets: 359 10*3/uL (ref 150–450)
RBC: 5.06 x10E6/uL (ref 3.77–5.28)
RDW: 13.6 % (ref 11.7–15.4)
WBC: 13.3 10*3/uL — ABNORMAL HIGH (ref 3.4–10.8)

## 2021-11-24 LAB — CMP14+EGFR
ALT: 21 IU/L (ref 0–32)
AST: 22 IU/L (ref 0–40)
Albumin/Globulin Ratio: 1.7 (ref 1.2–2.2)
Albumin: 4.3 g/dL (ref 3.8–4.8)
Alkaline Phosphatase: 94 IU/L (ref 44–121)
BUN/Creatinine Ratio: 16 (ref 12–28)
BUN: 16 mg/dL (ref 8–27)
Bilirubin Total: 0.2 mg/dL (ref 0.0–1.2)
CO2: 20 mmol/L (ref 20–29)
Calcium: 10 mg/dL (ref 8.7–10.3)
Chloride: 101 mmol/L (ref 96–106)
Creatinine, Ser: 1 mg/dL (ref 0.57–1.00)
Globulin, Total: 2.5 g/dL (ref 1.5–4.5)
Glucose: 136 mg/dL — ABNORMAL HIGH (ref 70–99)
Potassium: 4.8 mmol/L (ref 3.5–5.2)
Sodium: 139 mmol/L (ref 134–144)
Total Protein: 6.8 g/dL (ref 6.0–8.5)
eGFR: 58 mL/min/{1.73_m2} — ABNORMAL LOW (ref >59)

## 2021-11-26 ENCOUNTER — Telehealth: Payer: Self-pay | Admitting: Family Medicine

## 2021-11-26 MED ORDER — AMLODIPINE BESYLATE 5 MG PO TABS: ORAL_TABLET | ORAL | 2 refills | Status: DC

## 2021-11-26 MED ORDER — AMLODIPINE BESYLATE 2.5 MG PO TABS: 2.5000 mg | ORAL_TABLET | Freq: Every day | ORAL | 2 refills | Status: DC

## 2021-11-26 NOTE — Assessment & Plan Note (Signed)
Controlled, no change in medication  

## 2021-11-26 NOTE — Assessment & Plan Note (Signed)
resolved 

## 2021-11-26 NOTE — Assessment & Plan Note (Signed)
Uncontrolled, after lab review, increase amlodipine dose to 7.5 mg daily and re eval in 6 to 8 weeks DASH diet and commitment to daily physical activity for a minimum of 30 minutes discussed and encouraged, as a part of hypertension management. The importance of attaining a healthy weight is also discussed.     11/23/2021    3:23 PM 11/23/2021    3:01 PM 11/23/2021    2:50 PM 11/21/2021    1:49 PM 11/13/2021    3:07 PM 11/13/2021    1:10 PM 11/13/2021   11:26 AM  BP/Weight  Systolic BP 886 773 736 681 168 145   594 707  Diastolic BP 70 52 52 96 615 79   79 116  Wt. (Lbs)   164 163     BMI   27.29 kg/m2 27.12 kg/m2

## 2021-11-26 NOTE — Assessment & Plan Note (Signed)
Asked:confirms currently smokes cigarettes °Assess: Unwilling to set a quit date, but is cutting back °Advise: needs to QUIT to reduce risk of cancer, cardio and cerebrovascular disease °Assist: counseled for 5 minutes and literature provided °Arrange: follow up in 2 to 4 months ° °

## 2021-11-26 NOTE — Progress Notes (Signed)
   [name]     MRN: [id number]      DOB: 05/01/1945   HPI [name] is here for follow up and re-evaluation of chronic medical conditions, medication management and review of any available recent lab and radiology data.  Preventive health is updated, specifically  Cancer screening and Immunization.   Has upcoming Ortho appt, c/o uncontrolled left hip pain   ROS Denies recent fever or chills. Denies sinus pressure, nasal congestion, ear pain or sore throat. Chronic chest congestion,  cough not wheezing. Denies chest pains, palpitations and leg swelling Denies abdominal pain, nausea, vomiting,diarrhea or constipation.   Denies dysuria, frequency, hesitancy or incontinence.  Denies headaches, seizures, numbness, or tingling. Denies depression, anxiety or insomnia. Denies skin break down or rash.   PE  BP (!) 160/70   Pulse (!) 49   Ht '5\' 5"'$  (1.651 m)   Wt 164 lb (74.4 kg)   SpO2 96%   BMI 27.29 kg/m   Patient alert and oriented and in no cardiopulmonary distress.  HEENT: No facial asymmetry, EOMI,     Neck supple .  Chest: decreased  air enry , no crackles or  wheezes  CVS: S1, S2 no murmurs, no S3.Regular rate.  ABD: Soft non tender.   Ext: No edema  MS: decreased  ROM spine, shoulders, hips and knees.  Skin: Intact, no ulcerations or rash noted.  Psych: Good eye contact, normal affect. Memory intact not anxious or depressed appearing.  CNS: CN 2-12 intact, power,  normal throughout.no focal deficits noted.   Assessment & Plan  HTN (hypertension) Uncontrolled, after lab review, increase amlodipine dose to 7.5 mg daily and re eval in 6 to 8 weeks DASH diet and commitment to daily physical activity for a minimum of 30 minutes discussed and encouraged, as a part of hypertension management. The importance of attaining a healthy weight is also discussed.     11/23/2021    3:23 PM 11/23/2021    3:01 PM 11/23/2021    2:50 PM 11/21/2021    1:49 PM 11/13/2021     3:07 PM 11/13/2021    1:10 PM 11/13/2021   11:26 AM  BP/Weight  Systolic BP 010 932 355 732 168 145   202 542  Diastolic BP 70 52 52 96 706 79   79 116  Wt. (Lbs)   164 163     BMI   27.29 kg/m2 27.12 kg/m2          Personal history of tobacco use, presenting hazards to health Asked:confirms currently smokes cigarettes Assess: Unwilling to set a quit date, but is cutting back Advise: needs to QUIT to reduce risk of cancer, cardio and cerebrovascular disease Assist: counseled for 5 minutes and literature provided Arrange: follow up in 2 to 4 months   Bilateral leg edema resolved  COPD (chronic obstructive pulmonary disease) with chronic bronchitis (HCC) Controlled, no change in medication

## 2021-11-26 NOTE — Telephone Encounter (Signed)
As a part of the result note, since her kidney function is normal, I am adding amlodipine 2.5 mg one daily , currently taking 5 mg daily, she takes BOTH , her BP is is high, I sent in the medication to start 11/27/2021 ?? Please ask

## 2021-11-27 ENCOUNTER — Encounter: Payer: Self-pay | Admitting: Family Medicine

## 2021-11-27 NOTE — Telephone Encounter (Signed)
Patient aware.

## 2021-11-28 DIAGNOSIS — M159 Polyosteoarthritis, unspecified: Secondary | ICD-10-CM | POA: Diagnosis not present

## 2021-11-28 DIAGNOSIS — J449 Chronic obstructive pulmonary disease, unspecified: Secondary | ICD-10-CM | POA: Diagnosis not present

## 2021-11-28 DIAGNOSIS — E119 Type 2 diabetes mellitus without complications: Secondary | ICD-10-CM | POA: Diagnosis not present

## 2021-12-01 ENCOUNTER — Ambulatory Visit (INDEPENDENT_AMBULATORY_CARE_PROVIDER_SITE_OTHER): Payer: Medicare Other | Admitting: Family Medicine

## 2021-12-01 ENCOUNTER — Encounter: Payer: Self-pay | Admitting: Family Medicine

## 2021-12-01 VITALS — BP 144/68 | HR 82 | Ht 65.0 in | Wt 169.0 lb

## 2021-12-01 DIAGNOSIS — I10 Essential (primary) hypertension: Secondary | ICD-10-CM

## 2021-12-01 DIAGNOSIS — R7303 Prediabetes: Secondary | ICD-10-CM

## 2021-12-01 DIAGNOSIS — Z0001 Encounter for general adult medical examination with abnormal findings: Secondary | ICD-10-CM | POA: Diagnosis not present

## 2021-12-01 NOTE — Patient Instructions (Addendum)
F/U as before,Jan 4 , at 11 am,  call if you need me sooner  You DO NEED amlodipine 5 mg AND amlodipine 2.5 mg , also spironolactone for blood pressure  HBA1C today  Continue to reduce cigarettes  Need chest scan in March 2024, nurse please please arrange  Thanks for choosing [facility], we consider it a privelige to serve you.\

## 2021-12-02 LAB — HEMOGLOBIN A1C
Est. average glucose Bld gHb Est-mCnc: 134 mg/dL
Hgb A1c MFr Bld: 6.3 % — ABNORMAL HIGH (ref 4.8–5.6)

## 2021-12-03 ENCOUNTER — Encounter: Payer: Self-pay | Admitting: Family Medicine

## 2021-12-03 DIAGNOSIS — Z0001 Encounter for general adult medical examination with abnormal findings: Secondary | ICD-10-CM | POA: Insufficient documentation

## 2021-12-03 NOTE — Assessment & Plan Note (Signed)

## 2021-12-03 NOTE — Progress Notes (Signed)
    [name]     MRN: [id number]      DOB: September 11, 1945  HPI: Patient is in for annual physical exam. Uncontrolled HTN is addressed , she had been unintentionally not taking med as prescribed. Recent labs,  are reviewed. Immunization is reviewed , and  updated if needed.   PE: BP (!) 144/68 (BP Location: Right Arm, Patient Position: Sitting, Cuff Size: Large)   Pulse 82   Ht '5\' 5"'$  (1.651 m)   Wt 169 lb (76.7 kg)   SpO2 94%   BMI 28.12 kg/m   Pleasant  female, alert and oriented x 3, in no cardio-pulmonary distress. Afebrile. HEENT No facial trauma or asymetry. Sinuses non tender.  Extra occullar muscles intact.. External ears normal, . Neck: supple, no adenopathy,JVD or thyromegaly.No bruits.  Chest: Clear to ascultation bilaterally.No crackles or wheezes.Decreased air entry Non tender to palpation    Cardiovascular system; Heart sounds normal,  S1 and  S2 ,no S3.  No murmur, or thrill. Apical beat not displaced Peripheral pulses normal.  Abdomen: Soft, non tender, no organomegaly or masses. l. No guarding, tenderness or rebound.    Musculoskeletal exam: Decreased  ROM of spine, hips , shoulders and knees.  Neurologic: Cranial nerves 2 to 12 intact. Power, tone ,sensation and reflexes normal throughout. disturbance in gait.  tremor.  Skin: Intact, no ulceration, erythema , scaling or rash noted. Pigmentation normal throughout  Psych; Normal mood and affect. Judgement and concentration normal   Assessment & Plan:  Encounter for annual general medical examination with abnormal findings in adult Annual exam as documented. Counseling done  re healthy lifestyle involving commitment to 150 minutes exercise per week, heart healthy diet, and attaining healthy weight.The importance of adequate sleep also discussed. Regular seat belt use and home safety, is also discussed. Changes in health habits are decided on by the patient with goals and time frames   set for achieving them. Immunization and cancer screening needs are specifically addressed at this visit.   HTN (hypertension) Uncontrolled , med compliance stressed, f/u in 5 weeks

## 2021-12-03 NOTE — Assessment & Plan Note (Signed)
Uncontrolled , med compliance stressed, f/u in 5 weeks

## 2021-12-05 ENCOUNTER — Encounter: Payer: Self-pay | Admitting: Orthopaedic Surgery

## 2021-12-05 ENCOUNTER — Ambulatory Visit (INDEPENDENT_AMBULATORY_CARE_PROVIDER_SITE_OTHER): Payer: Medicare Other | Admitting: Orthopaedic Surgery

## 2021-12-05 DIAGNOSIS — S76302D Unspecified injury of muscle, fascia and tendon of the posterior muscle group at thigh level, left thigh, subsequent encounter: Secondary | ICD-10-CM | POA: Diagnosis not present

## 2021-12-05 MED ORDER — CYCLOBENZAPRINE HCL 5 MG PO TABS: 5.0000 mg | ORAL_TABLET | Freq: Every evening | ORAL | 3 refills | Status: DC | PRN

## 2021-12-05 MED ORDER — TRAMADOL HCL 50 MG PO TABS: ORAL_TABLET | ORAL | 3 refills | Status: DC

## 2021-12-05 NOTE — Addendum Note (Signed)
Addended by: Obie Dredge A on: 12/05/2021 03:54 PM   Modules accepted: Orders

## 2021-12-05 NOTE — Progress Notes (Signed)
My leg is hurting more.  She has more pain of the lateral hamstrings.  She has no new trauma.  Nothing seems to help. She is taking the Tramadol.  Left lower thigh is tender over the distal lateral hamstrings.  She has no defect, no redness.  NV intact. ROM of knee is good.  Encounter Diagnosis  Name Primary?   Hamstring injury, left, subsequent encounter Yes   I will get MRI of the thigh.  I will refill her Tramadol and Flexeril  I have reviewed the [place] Controlled Substance Reporting System web site prior to prescribing narcotic medicine for this patient.  Return in three weeks.  Call if any problem.  Precautions discussed.  Electronically Signed Sanjuana Kava, MD 11/21/20231:53 PM

## 2021-12-05 NOTE — Patient Instructions (Signed)
Central Scheduling [phone]  While we are working on your approval please go ahead and call to schedule your appointment to be done within one week. If you can not get an appointment at [facility] within the next week, ask if they have something sooner at [facility] or [facility] if you are able to go to [place] to have the imaging done.  AFTER you have made your imaging appointment, please call our office back at [phone] to schedule an appointment to review your results.  CURRENTLY [employer] ONLY AUTHORIZES IMAGING TO BE PERFORMED AT  IMAGING. THEY WILL NOT APPROVE ANY OTHER FACILITY.**  [facility] Address: [address] [place], [place] [address] Phone: [phone]   [facility] [address], [place] [address] Phone: [phone]

## 2021-12-08 DIAGNOSIS — J449 Chronic obstructive pulmonary disease, unspecified: Secondary | ICD-10-CM | POA: Diagnosis not present

## 2021-12-12 DIAGNOSIS — J449 Chronic obstructive pulmonary disease, unspecified: Secondary | ICD-10-CM | POA: Diagnosis not present

## 2021-12-15 ENCOUNTER — Other Ambulatory Visit: Payer: Self-pay | Admitting: Internal Medicine

## 2021-12-18 ENCOUNTER — Other Ambulatory Visit: Payer: Self-pay | Admitting: Family Medicine

## 2021-12-18 DIAGNOSIS — J4489 Other specified chronic obstructive pulmonary disease: Secondary | ICD-10-CM

## 2021-12-18 DIAGNOSIS — R918 Other nonspecific abnormal finding of lung field: Secondary | ICD-10-CM

## 2021-12-21 NOTE — Progress Notes (Signed)
[facility] Quality Team Note  Name: [name] Date of Birth: Dec 10, 1945 MRN: [id number] Date: 12/21/2021  [facility] Quality Team has reviewed this patient's chart, please see recommendations below:  [facility] Quality Other; KED: Kidney Health Evaluation Gap- Patient needs Urine Albumin Creatinine Ratio Test completed for gap closure. EGFR portion has already been completed.

## 2021-12-26 ENCOUNTER — Ambulatory Visit: Payer: Medicare Other | Admitting: Orthopaedic Surgery

## 2021-12-27 ENCOUNTER — Ambulatory Visit (HOSPITAL_COMMUNITY)
Admission: RE | Admit: 2021-12-27 | Discharge: 2021-12-27 | Disposition: A | Discharge status: Home / Self Care | Payer: Medicare Other | Source: Ambulatory Visit | Attending: Orthopaedic Surgery | Admitting: Orthopaedic Surgery

## 2021-12-27 DIAGNOSIS — M79605 Pain in left leg: Secondary | ICD-10-CM | POA: Diagnosis not present

## 2021-12-27 DIAGNOSIS — S76302D Unspecified injury of muscle, fascia and tendon of the posterior muscle group at thigh level, left thigh, subsequent encounter: Secondary | ICD-10-CM | POA: Diagnosis not present

## 2021-12-28 DIAGNOSIS — E119 Type 2 diabetes mellitus without complications: Secondary | ICD-10-CM | POA: Diagnosis not present

## 2021-12-28 DIAGNOSIS — J449 Chronic obstructive pulmonary disease, unspecified: Secondary | ICD-10-CM | POA: Diagnosis not present

## 2021-12-28 DIAGNOSIS — M159 Polyosteoarthritis, unspecified: Secondary | ICD-10-CM | POA: Diagnosis not present

## 2022-01-02 ENCOUNTER — Ambulatory Visit (INDEPENDENT_AMBULATORY_CARE_PROVIDER_SITE_OTHER): Payer: Medicare Other | Admitting: Orthopaedic Surgery

## 2022-01-02 ENCOUNTER — Encounter: Payer: Self-pay | Admitting: Orthopaedic Surgery

## 2022-01-02 DIAGNOSIS — S76302D Unspecified injury of muscle, fascia and tendon of the posterior muscle group at thigh level, left thigh, subsequent encounter: Secondary | ICD-10-CM | POA: Diagnosis not present

## 2022-01-02 NOTE — Progress Notes (Signed)
She has less pain of the hamstring on the left lateral.  She had the MRI which showed: IMPRESSION: No acute or focal abnormality is identified. No finding to explain the patient's symptoms. Specifically, the hamstrings are intact.   Partial visualization mild to moderate appearing right hip osteoarthritis.   IMPRESSION: No acute abnormality or finding to explain the patient's symptoms.   I have explained the findings to her.  I have independently reviewed the MRI.    She has much less pain to examination of the lateral hamstring on the left.  She is walking better, has good ROM of the knee on the left.  NV intact.  Encounter Diagnosis  Name Primary?   Hamstring injury, left, subsequent encounter Yes   Continue as she is doing.   Continue the tramadol.  Return in one month.  Call if any problem.  Precautions discussed.  Electronically Signed Sanjuana Kava, MD 12/19/20231:38 PM

## 2022-01-07 DIAGNOSIS — J449 Chronic obstructive pulmonary disease, unspecified: Secondary | ICD-10-CM | POA: Diagnosis not present

## 2022-01-15 DIAGNOSIS — Z7951 Long term (current) use of inhaled steroids: Secondary | ICD-10-CM | POA: Insufficient documentation

## 2022-01-15 DIAGNOSIS — Z9981 Dependence on supplemental oxygen: Secondary | ICD-10-CM | POA: Insufficient documentation

## 2022-01-15 DIAGNOSIS — Z7901 Long term (current) use of anticoagulants: Secondary | ICD-10-CM | POA: Insufficient documentation

## 2022-01-18 ENCOUNTER — Encounter: Payer: Self-pay | Admitting: Family Medicine

## 2022-01-18 ENCOUNTER — Encounter: Payer: Self-pay | Admitting: Internal Medicine

## 2022-01-18 ENCOUNTER — Ambulatory Visit (INDEPENDENT_AMBULATORY_CARE_PROVIDER_SITE_OTHER): Payer: Medicare Other | Admitting: Internal Medicine

## 2022-01-18 ENCOUNTER — Ambulatory Visit (INDEPENDENT_AMBULATORY_CARE_PROVIDER_SITE_OTHER): Payer: 59 | Admitting: Family Medicine

## 2022-01-18 VITALS — HR 62

## 2022-01-18 VITALS — BP 126/72 | HR 90 | Ht 65.0 in | Wt 160.1 lb

## 2022-01-18 DIAGNOSIS — E785 Hyperlipidemia, unspecified: Secondary | ICD-10-CM

## 2022-01-18 DIAGNOSIS — J0101 Acute recurrent maxillary sinusitis: Secondary | ICD-10-CM

## 2022-01-18 DIAGNOSIS — J209 Acute bronchitis, unspecified: Secondary | ICD-10-CM | POA: Diagnosis not present

## 2022-01-18 DIAGNOSIS — J4489 Other specified chronic obstructive pulmonary disease: Secondary | ICD-10-CM | POA: Diagnosis not present

## 2022-01-18 DIAGNOSIS — I10 Essential (primary) hypertension: Secondary | ICD-10-CM | POA: Diagnosis not present

## 2022-01-18 DIAGNOSIS — F17210 Nicotine dependence, cigarettes, uncomplicated: Secondary | ICD-10-CM

## 2022-01-18 DIAGNOSIS — Z87891 Personal history of nicotine dependence: Secondary | ICD-10-CM

## 2022-01-18 DIAGNOSIS — R7303 Prediabetes: Secondary | ICD-10-CM | POA: Diagnosis not present

## 2022-01-18 DIAGNOSIS — J440 Chronic obstructive pulmonary disease with acute lower respiratory infection: Secondary | ICD-10-CM | POA: Diagnosis not present

## 2022-01-18 MED ORDER — NICOTINE 7 MG/24HR TD PT24: 7.0000 mg | MEDICATED_PATCH | Freq: Every day | TRANSDERMAL | 0 refills | Status: DC

## 2022-01-18 MED ORDER — BENZONATATE 100 MG PO CAPS: 100.0000 mg | ORAL_CAPSULE | Freq: Two times a day (BID) | ORAL | 0 refills | Status: DC | PRN

## 2022-01-18 MED ORDER — ERYTHROMYCIN BASE 500 MG PO TABS: 500.0000 mg | ORAL_TABLET | Freq: Three times a day (TID) | ORAL | 0 refills | Status: DC

## 2022-01-18 NOTE — Patient Instructions (Signed)
Follow-up in 4 months, call if you need me sooner.  Blood pressure is excellent, stay on same medication.  You need to quit smoking starting today as we discussed.  You have chronic chest congestion and recurrent infections because of ongoing smoking.  New prescription is  NicoDerm patches you can use these for 1 month and you must not smoke while using them, this will help with your plan to quit smoking.  You are treated today for acute bronchitis and sinusitis.  Tessalon Perles erythromycin are prescribed.  Please get fasting lipid CMP and EGFR HbA1c CBC TSH and vitamin D 3 to 5 days before your visit in 4 months.

## 2022-01-18 NOTE — Assessment & Plan Note (Signed)
Counseled re importance of smoking cessation but did not meet time criteria for separate billing           Each maintenance medication was reviewed in detail including emphasizing most importantly the difference between maintenance and prns and under what circumstances the prns are to be triggered using an action plan format where appropriate.  Total time for H and P, chart review, counseling, reviewing hfa /nebdevice(s) and generating customized AVS unique to this office visit / same day charting = 25 min

## 2022-01-18 NOTE — Progress Notes (Signed)
[name]     MRN: [id number]      DOB: [birth year]   HPI Ms. Florea is here for follow up and re-evaluation of chronic medical conditions, medication management and review of any available recent lab and radiology data.  Preventive health is updated, specifically  Cancer screening and Immunization.   Questions or concerns regarding consultations or procedures which the PT has had in the interim are  addressed. The PT denies any adverse reactions to current medications since the last visit.  1 week h/o increased chest congestion with yellow  green sputum, some head  congestion, yellow nasal draiange, c/o left maxillary pressure   ROS Denies recent fever or chills. Denies chest pains, palpitations and leg swelling Denies abdominal pain, nausea, vomiting,diarrhea or constipation.   Denies dysuria, frequency, hesitancy or incontinence. Chronic  joint pain, swelling and limitation in mobility. Denies headaches, seizures, numbness, or tingling. Denies depression, anxiety or insomnia. Denies skin break down or rash.   PE  BP 126/72   Pulse 90   Ht '5\' 5"'$  (1.651 m)   Wt 160 lb 1.3 oz (72.6 kg)   SpO2 92%   BMI 26.64 kg/m   Patient alert and oriented and in no cardiopulmonary distress.  HEENT: No facial asymmetry, EOMI,     Neck supple .Maxillary sinus tenderness, TM clear  Chest: decreased air entry, scattered crackles and wheezes CVS: S1, S2 no murmurs, no S3.Regular rate.  ABD: Soft non tender.   Ext: No edema  MS: decreased ROM spine, shoulders, hips and knees.  Skin: Intact, no ulcerations or rash noted.  Psych: Good eye contact, normal affect. Memory intact not anxious or depressed appearing.  CNS: CN 2-12 intact, power,  normal throughout.no focal deficits noted.   Assessment & Plan  HTN (hypertension) Controlled, no change in medication DASH diet and commitment to daily physical activity for a minimum of 30 minutes discussed and encouraged, as a part of  hypertension management. The importance of attaining a healthy weight is also discussed.     01/18/2022   11:41 AM 01/18/2022   11:02 AM 12/01/2021    1:59 PM 12/01/2021    1:51 PM 11/23/2021    3:23 PM 11/23/2021    3:01 PM 11/23/2021    2:50 PM  BP/Weight  Systolic BP 591 638 466 599 357 017 793  Diastolic BP 72 79 68 68 70 52 52  Wt. (Lbs)  160.08  169   164  BMI  26.64 kg/m2  28.12 kg/m2   27.29 kg/m2       Acute bronchitis with COPD (HCC) Current flare, tessalon perle and Emycin prescribed, needs to quit smoking  Personal history of tobacco use, presenting hazards to health Asked:confirms currently smokes cigarettes Assess: setting qui date of the day of visit  Advise: needs to QUIT to reduce risk of cancer, cardio and cerebrovascular disease Assist: counseled for 5 minutes and literature provided Arrange: follow up in 2 to 4 months Nicoderm prescribed   Maxillary sinusitis, acute Erythromycin prescribed  Prediabetes Patient educated about the importance of limiting  Carbohydrate intake , the need to commit to daily physical activity for a minimum of 30 minutes , and to commit weight loss. The fact that changes in all these areas will reduce or eliminate all together the development of diabetes is stressed.      Latest Ref Rng & Units 12/01/2021    2:46 PM 11/23/2021    3:35 PM 08/24/2021   11:12 AM  08/24/2021   11:06 AM 06/05/2021    8:07 AM  Diabetic Labs  HbA1c 4.8 - 5.6 % 6.3     6.1  C  Chol 0 - 200 mg/dL   164   168   HDL >40 mg/dL   80   67   Calc LDL 0 - 99 mg/dL   70   79   Triglycerides <150 mg/dL   70   128   Creatinine 0.57 - 1.00 mg/dL  1.00  0.95  1.00  1.00     C Corrected result      01/18/2022   11:41 AM 01/18/2022   11:02 AM 12/01/2021    1:59 PM 12/01/2021    1:51 PM 11/23/2021    3:23 PM 11/23/2021    3:01 PM 11/23/2021    2:50 PM  BP/Weight  Systolic BP 384 665 993 570 177 939 030  Diastolic BP 72 79 68 68 70 52 52  Wt. (Lbs)  160.08  169    164  BMI  26.64 kg/m2  28.12 kg/m2   27.29 kg/m2      Latest Ref Rng & Units 09/07/2014   12:00 AM 06/10/2013    2:00 PM  Foot/eye exam completion dates  Eye Exam No Retinopathy No Retinopathy       Foot Form Completion   Done     This result is from an external source.    Updated lab needed at/ before next visit.   Hyperlipidemia LDL goal <100 Hyperlipidemia:Low fat diet discussed and encouraged.   Lipid Panel  Lab Results  Component Value Date   CHOL 164 08/24/2021   HDL 80 08/24/2021   LDLCALC 70 08/24/2021   TRIG 70 08/24/2021   CHOLHDL 2.1 08/24/2021     Updated lab needed at/ before next visit.

## 2022-01-18 NOTE — Assessment & Plan Note (Signed)
Active smoker  - PFT's  03/03/2012  FEV1 1.69 (69 % ) ratio 0.72  p 5 % improvement from saba p ? prior to study with DLCO  17.42 (68%) corrects to 4.53 (92%)  for alv volume and FV curve min curvature/ ERV 37%   - try off acei 11/19/2019 and return for medication reconciliation in 6 weeks - 12/30/2020  After extensive coaching inhaler device,  effectiveness =    75% from a baseline of 50%  - 07/19/2021  After extensive coaching inhaler device,  effectiveness =    75% (short Ti)  - PFT's  09/26/21  FEV1 0.97 (44 % ) ratio 0.54  p 5 % improvement from saba p ? prior to study with DLCO  12.37 (62%)   and FV curve not typical for copd/ air trapping by lung volumes     Group D (now reclassified as E) in terms of symptom/risk and laba/lama/ICS  therefore appropriate rx at this point >>>  breztri but declined a trial (daughter didn't like it) so continue symbicort 160 2bid and f/u in 6 m, call sooner if needed

## 2022-01-18 NOTE — Progress Notes (Signed)
[name], female    DOB: 11/11/1945    MRN: [id number]   Brief patient profile:  20 yowf active smoker with new onset doe around 2014 dx as copd (though PFTs 03/03/12 did not confirm) and referred to pulmonary clinic in [facility]  11/19/2019 by Dr    Moshe Cipro for refractory cough and sob  History of Present Illness  11/19/2019  Pulmonary/ 1st office eval/ [name] / [place] on symbicort 160/atrovent Chief Complaint  Patient presents with   Consult    sinus congestion for about a 1-2 weeks  Dyspnea:  Goal mb and back 100 ft /  Variably sob shopping / not using [facility] parking  Cough: sporadic  Sleep: fine / some worse cough in am SABA use: not clear  rec Benazapril 40 mg  > stop this and replace with ibesartan 300 mg one daily in its place  Plan A = Automatic = Always=    symbicort 160 Take 2 puffs first thing in am and then another 2 puffs about 12 hours later.  Work on inhaler technique:   Plan B = Backup (to supplement plan A, not to replace it) Only use your albuterol inhaler as a rescue medication  Plan C = Crisis (instead of Plan B but only if Plan B stops working) - only use your albuterol nebulizer if you first try Plan B and it fails to help  The key is to stop smoking completely before smoking completely stops you! Please schedule a follow up office visit in 6 weeks, call sooner if needed with all medications     12/29/2019  f/u ov/[place] office/[name] re: doe/ brown mucus x one week already took medrol dose pack p televisit with pcp  Chief Complaint  Patient presents with   Follow-up    Still bothered by sinus congestion. She is c/o cough with brown sputum over the past wk. She is using the albuterol inhaler about 3 x per wk.   Asthmatic bronchitis, chronic (HCC) Active smoker  - PFT's  03/03/2012  FEV1 1.69 (69 % ) ratio 0.72  p 5 % improvement from saba p ? prior to study with DLCO  17.42 (68%) corrects to 4.53 (92%)  for alv volume and FV curve min  curvature/ ERV 37%   - try off acei 11/19/2019 and return for medication reconciliation in 6 weeks  Hypertension goal BP (blood pressure) < 130/80  Dyspnea:  Better though very inactive Cough:  X one week worse as above   SABA use: very poor insight into ABC plan, could not articulate any part of it even with prompting  02: none  rec Continue Irbesartan 300 mg one daily plus add  hydrodiuril (hydrochlorothiazide) 25 mg one daily  zpak should changes mucus brown to clear  The key is to stop smoking completely before smoking completely stops you! For cough > mucinex 1200 mg every 12 hours as needed (over the counter) Plan A = Automatic = Always=    symbicort 160 Take 2 puffs first thing in am and then another 2 puffs about 12 hours later atrovent is 2 puffs four times daily  Work on inhaler technique:  Plan B = Backup (to supplement plan A, not to replace it) Only use your albuterol inhaler as a rescue medication to be used if you can't catch your breath  Plan C = Crisis (instead of Plan B but only if Plan B stops working) - only use your albuterol nebulizer if you first  try Plan B and it fails to help > ok to use the nebulizer up to every 4 hours but if start needing it regularly call for immediate appointment Please schedule a follow up office visit in 6 weeks with pfts on return      07/19/2021  f/u ov/[place] office/[name] Every re: AB maint on symbicort 160 2bid   Chief Complaint  Patient presents with   Follow-up    Doing well. No new concerns    Dyspnea:  MB and back easier / no real steps  Cough: still smoker's cough worse in ams Sleeping: does fine bed is flat with pillows  SABA use: none now  02: none Rec Plan A = Automatic = Always=    Symbicort 160 Take 2 puffs first thing in am and then another 2 puffs about 12 hours later.  Work on inhaler technique:  Plan B = Backup (to supplement plan A, not to replace it) Only use your albuterol inhaler as a rescue medication  Plan C =  Crisis (instead of Plan B but only if Plan B stops working) - only use your albuterol nebulizer if you first try Plan B  PFTs prior to next office visit  The key is to stop smoking completely before smoking completely stops you! Please schedule a follow up visit in 6 months but call sooner if needed  with all medications /inhalers/ solutions in hand    01/18/2022  f/u ov/[place] office/[name] re: GOLD 3/ AB  maint on symb 160 Chief Complaint  Patient presents with   Follow-up    Breathing doing ok   Dyspnea:  no longer doing mb due to L leg pain  Cough: better but still smoker's rattle worse in am/ slt creamy  Sleeping: flat bed with pillows  SABA use: very confused with instructions 02: none  Covid status: up to date  Lung cancer screening: in program    No obvious day to day or daytime variability or assoc   mucus plugs or hemoptysis or cp or chest tightness, subjective wheeze or overt sinus or hb symptoms.   Sleeping  without nocturnal  or early am exacerbation  of respiratory  c/o's or need for noct saba. Also denies any obvious fluctuation of symptoms with weather or environmental changes or other aggravating or alleviating factors except as outlined above   No unusual exposure hx or h/o childhood pna/ asthma or knowledge of premature birth.  Current Allergies, Complete Past Medical History, Past Surgical History, Family History, and Social History were reviewed in [employer] record.  ROS  The following are not active complaints unless bolded Hoarseness, sore throat, dysphagia, dental problems, itching, sneezing,  nasal congestion or discharge of excess mucus or purulent secretions, ear ache,   fever, chills, sweats, unintended wt loss or wt gain, classically pleuritic or exertional cp,  orthopnea pnd or arm/hand swelling  or leg swelling, presyncope, palpitations, abdominal pain, anorexia, nausea, vomiting, diarrhea  or change in bowel habits or change in  bladder habits, change in stools or change in urine, dysuria, hematuria,  rash, arthralgias, visual complaints, headache, numbness, weakness or ataxia or problems with walking or coordination,  change in mood or  memory.        Current Meds  Medication Sig   acetaminophen (TYLENOL) 325 MG tablet Take 650 mg by mouth every 6 (six) hours as needed for moderate pain.    albuterol (PROVENTIL) (2.5 MG/3ML) 0.083% nebulizer solution INHALE ONE VIAL VIA NEBULIZER THREE TIMES  DAILY. (Patient taking differently: Take 2.5 mg by nebulization 3 (three) times daily as needed for wheezing or shortness of breath.)   albuterol (VENTOLIN HFA) 108 (90 Base) MCG/ACT inhaler INHALE (2) PUFFS EVERY FOURHOURS AS NEEDED ONLY IF YOU CANT CATCH YOUR BREATH   amLODipine (NORVASC) 2.5 MG tablet Take 1 tablet (2.5 mg total) by mouth daily.   amLODipine (NORVASC) 5 MG tablet TAKE (1) TABLET BY MOUOTH ONCE DAILY.   aspirin EC 81 MG tablet Take 81 mg by mouth daily.   benzonatate (TESSALON) 100 MG capsule Take 1 capsule (100 mg total) by mouth 2 (two) times daily as needed for cough.   budesonide-formoterol (SYMBICORT) 160-4.5 MCG/ACT inhaler INHALE 2 PUFFS INTO THE LUNGS TWICE DAILY.   Calcium Carbonate-Vitamin D (CALCIUM 600 + D PO) Take 1 tablet by mouth 2 (two) times daily.   Cyanocobalamin (VITAMIN B 12) 500 MCG TABS Take one tablet by mouth once daily   cyclobenzaprine (FLEXERIL) 5 MG tablet Take 1 tablet (5 mg total) by mouth at bedtime as needed.   diclofenac Sodium (VOLTAREN) 1 % GEL Apply 2 g topically 4 (four) times daily.   Emollient (VASELINE INTENSIVE CARE EX) Apply 1 application topically daily.   erythromycin base (E-MYCIN) 500 MG tablet Take 1 tablet (500 mg total) by mouth 3 (three) times daily.   famotidine (PEPCID) 20 MG tablet Take 20 mg by mouth at bedtime.    fluticasone (FLONASE) 50 MCG/ACT nasal spray PLACE 2 SPRAYS IN EACH NOSTRIL DAILY.   ipratropium (ATROVENT HFA) 17 MCG/ACT inhaler INHALE 2 PUFFS  BY MOUTH FOUR TIMES A DAY.   loratadine (CLARITIN) 10 MG tablet TAKE 1 TABLET BY MOUTH ONCE DAILY FOR ALLERGIES. (Patient taking differently: Take 10 mg by mouth daily.)   Misc. Devices MISC Please provide patient with mastectomy bra and prosthesis. Dx: History of left breast cancer   montelukast (SINGULAIR) 10 MG tablet TAKE 1 TABLET BY MOUTH AT BEDTIME.   Naphazoline HCl (CLEAR EYES OP) Place 1 drop into both eyes daily as needed (irritation).   nicotine (NICODERM CQ - DOSED IN MG/24 HR) 7 mg/24hr patch Place 1 patch (7 mg total) onto the skin daily.   pantoprazole (PROTONIX) 40 MG tablet TAKE ONE TABLET BY MOUTH ONCE DAILY BEFORE BREAKFAST.   polyethylene glycol powder (GLYCOLAX/MIRALAX) powder MIX 1 CAPFUL IN 8 OUNCES OF JUICE OR WATER AND DRINK ONCE DAILY. (Patient taking differently: Take 17 g by mouth daily.)   Potassium 99 MG TABS Take 99 mg by mouth 1 day or 1 dose.   rosuvastatin (CRESTOR) 10 MG tablet TAKE 1 TABLET BY MOUTH ONCE A DAY.   spironolactone (ALDACTONE) 50 MG tablet TAKE 1 TABLET BY MOUTH ONCE A DAY.   traMADol (ULTRAM) 50 MG tablet One tablet every eight hours as needed for pain.                     Past Medical History:  Diagnosis Date   Breast cancer ([place]) 2007   Stage I (T1b N0 M0), grade 1 well-differentiated carcinoma of the left breast status, post lumpectomy followed by radiation therapy. Her estrogen receptor receptors were 93%, progesterone receptors 67%. HER-2/neu was negative. No lymphovascular space invasion was seen. All margins were clear. Ki-67 marker was low at 1% with surgery on 11/15/2004. Treated then with post-lumpectomy radiation, finish   Breast cancer, left breast ([place]) 2007   COPD (chronic obstructive pulmonary disease) ([place])    Diabetes mellitus without complication ([place])  Diverticula of colon    Hx of rickettsial disease    Hyperlipidemia    Hypertension    Kidney stones    Nicotine dependence    Osteoarthritis        Objective:     Wts    07/19/2021          169  12/30/2020     170 11/16/2020       174     04/04/2020       174  12/29/19 179 lb (81.2 kg)  12/04/19 177 lb (80.3 kg)  11/19/19 177 lb 3.2 oz (80.4 kg)    Vital signs reviewed  01/18/2022  - Note at rest 02 sats  97% on RA   General appearance:    amb wf /smoker's rattle  HEENT : Oropharynx  clear      NECK :  without  apparent JVD/ palpable Nodes/TM    LUNGS: no acc muscle use,  Mild barrel  contour chest wall with bilateral  Distant bs s audible wheeze and  without cough on insp or exp maneuvers  and mild  Hyperresonant  to  percussion bilaterally     CV:  RRR  no s3 or murmur or increase in P2, and no edema   ABD:  soft and nontender with pos end  insp Hoover's  in the supine position.  No bruits or organomegaly appreciated   MS: ext warm without deformities Or obvious joint restrictions  calf tenderness, cyanosis or clubbing     SKIN: warm and dry without lesions    NEURO:  alert, approp, nl sensorium with  no motor or cerebellar deficits apparent.           Assessment

## 2022-01-18 NOTE — Patient Instructions (Addendum)
Plan A = Automatic = Always=    Symbicort 160 Take 2 puffs first thing in am and then another 2 puffs about 12 hours later.   Work on inhaler technique:  relax and gently blow all the way out then take a nice smooth full deep breath back in, triggering the inhaler at same time you start breathing in.  Hold breath in for at least  5 seconds if you can. Blow out symbicort  thru nose. Rinse and gargle with water when done.  If mouth or throat bother you at all,  try brushing teeth/gums/tongue with arm and hammer toothpaste/ make a slurry and gargle and spit out.   Please schedule a follow up visit in 3 months but call sooner if needed                                                                                                                            Plan B = Backup (to supplement plan A, not to replace it) Only use your albuterol inhaler as a rescue medication to be used if you can't catch your breath by resting or doing a relaxed purse lip breathing pattern.  - The less you use it, the better it will work when you need it. - Ok to use the inhaler up to 2 puffs  every 4 hours if you must but call for appointment if use goes up over your usual need - Don't leave home without it !!  (think of it like the spare tire for your car)   Plan C = Crisis (instead of Plan B but only if Plan B stops working) - only use your albuterol nebulizer if you first try Plan B and it fails to help > ok to use the nebulizer up to every 4 hours but if start needing it regularly call for immediate appointment    Please schedule a follow up visit in 6 months but call sooner if needed

## 2022-01-19 ENCOUNTER — Other Ambulatory Visit: Payer: Self-pay | Admitting: Family Medicine

## 2022-01-19 DIAGNOSIS — R918 Other nonspecific abnormal finding of lung field: Secondary | ICD-10-CM

## 2022-01-19 DIAGNOSIS — J4489 Other specified chronic obstructive pulmonary disease: Secondary | ICD-10-CM

## 2022-01-21 ENCOUNTER — Encounter: Payer: Self-pay | Admitting: Family Medicine

## 2022-01-21 NOTE — Assessment & Plan Note (Signed)
Controlled, no change in medication DASH diet and commitment to daily physical activity for a minimum of 30 minutes discussed and encouraged, as a part of hypertension management. The importance of attaining a healthy weight is also discussed.     01/18/2022   11:41 AM 01/18/2022   11:02 AM 12/01/2021    1:59 PM 12/01/2021    1:51 PM 11/23/2021    3:23 PM 11/23/2021    3:01 PM 11/23/2021    2:50 PM  BP/Weight  Systolic BP 073 710 626 948 546 270 350  Diastolic BP 72 79 68 68 70 52 52  Wt. (Lbs)  160.08  169   164  BMI  26.64 kg/m2  28.12 kg/m2   27.29 kg/m2

## 2022-01-21 NOTE — Assessment & Plan Note (Signed)
Patient educated about the importance of limiting  Carbohydrate intake , the need to commit to daily physical activity for a minimum of 30 minutes , and to commit weight loss. The fact that changes in all these areas will reduce or eliminate all together the development of diabetes is stressed.      Latest Ref Rng & Units 12/01/2021    2:46 PM 11/23/2021    3:35 PM 08/24/2021   11:12 AM 08/24/2021   11:06 AM 06/05/2021    8:07 AM  Diabetic Labs  HbA1c 4.8 - 5.6 % 6.3     6.1  C  Chol 0 - 200 mg/dL   164   168   HDL >40 mg/dL   80   67   Calc LDL 0 - 99 mg/dL   70   79   Triglycerides <150 mg/dL   70   128   Creatinine 0.57 - 1.00 mg/dL  1.00  0.95  1.00  1.00     C Corrected result      01/18/2022   11:41 AM 01/18/2022   11:02 AM 12/01/2021    1:59 PM 12/01/2021    1:51 PM 11/23/2021    3:23 PM 11/23/2021    3:01 PM 11/23/2021    2:50 PM  BP/Weight  Systolic BP 329 518 841 660 630 160 109  Diastolic BP 72 79 68 68 70 52 52  Wt. (Lbs)  160.08  169   164  BMI  26.64 kg/m2  28.12 kg/m2   27.29 kg/m2      Latest Ref Rng & Units 09/07/2014   12:00 AM 06/10/2013    2:00 PM  Foot/eye exam completion dates  Eye Exam No Retinopathy No Retinopathy       Foot Form Completion   Done     This result is from an external source.    Updated lab needed at/ before next visit.

## 2022-01-21 NOTE — Assessment & Plan Note (Signed)
Erythromycin prescribed

## 2022-01-21 NOTE — Assessment & Plan Note (Signed)
Current flare, tessalon perle and Emycin prescribed, needs to quit smoking

## 2022-01-21 NOTE — Assessment & Plan Note (Signed)
Hyperlipidemia:Low fat diet discussed and encouraged.   Lipid Panel  Lab Results  Component Value Date   CHOL 164 08/24/2021   HDL 80 08/24/2021   LDLCALC 70 08/24/2021   TRIG 70 08/24/2021   CHOLHDL 2.1 08/24/2021     Updated lab needed at/ before next visit.

## 2022-01-21 NOTE — Assessment & Plan Note (Signed)
Asked:confirms currently smokes cigarettes Assess: setting qui date of the day of visit  Advise: needs to QUIT to reduce risk of cancer, cardio and cerebrovascular disease Assist: counseled for 5 minutes and literature provided Arrange: follow up in 2 to 4 months Nicoderm prescribed

## 2022-01-24 ENCOUNTER — Other Ambulatory Visit: Payer: Self-pay

## 2022-01-24 DIAGNOSIS — Z122 Encounter for screening for malignant neoplasm of respiratory organs: Secondary | ICD-10-CM

## 2022-01-24 DIAGNOSIS — Z87891 Personal history of nicotine dependence: Secondary | ICD-10-CM

## 2022-01-24 NOTE — Progress Notes (Unsigned)
LDCT order placed per protocol,

## 2022-01-29 DIAGNOSIS — E119 Type 2 diabetes mellitus without complications: Secondary | ICD-10-CM | POA: Diagnosis not present

## 2022-01-29 DIAGNOSIS — J449 Chronic obstructive pulmonary disease, unspecified: Secondary | ICD-10-CM | POA: Diagnosis not present

## 2022-01-29 DIAGNOSIS — M159 Polyosteoarthritis, unspecified: Secondary | ICD-10-CM | POA: Diagnosis not present

## 2022-01-30 ENCOUNTER — Encounter: Payer: Self-pay | Admitting: Orthopaedic Surgery

## 2022-01-30 ENCOUNTER — Ambulatory Visit (INDEPENDENT_AMBULATORY_CARE_PROVIDER_SITE_OTHER): Payer: Medicare Other | Admitting: Orthopaedic Surgery

## 2022-01-30 VITALS — BP 175/82 | HR 103 | Ht 65.0 in | Wt 160.0 lb

## 2022-01-30 DIAGNOSIS — S76302D Unspecified injury of muscle, fascia and tendon of the posterior muscle group at thigh level, left thigh, subsequent encounter: Secondary | ICD-10-CM

## 2022-01-30 NOTE — Progress Notes (Signed)
I am a little better.  Her left hamstring pain is improved today.  She still has some pain but not as much.  She is doing her exercises. She has no new trauma.  ROM of the right knee and right hip is good as is the left hip and knee.  She has just some slight lateral hamstring tenderness on the left.  Encounter Diagnosis  Name Primary?   Hamstring injury, left, subsequent encounter Yes   Return in two months.  Continue exercises.  Call if any problem.  Precautions discussed.  Electronically Signed Sanjuana Kava, MD 1/16/20241:31 PM

## 2022-02-14 ENCOUNTER — Ambulatory Visit: Payer: 59 | Admitting: Family Medicine

## 2022-02-14 DIAGNOSIS — E118 Type 2 diabetes mellitus with unspecified complications: Secondary | ICD-10-CM | POA: Diagnosis not present

## 2022-02-16 ENCOUNTER — Other Ambulatory Visit: Payer: Self-pay | Admitting: Family Medicine

## 2022-02-16 DIAGNOSIS — R918 Other nonspecific abnormal finding of lung field: Secondary | ICD-10-CM

## 2022-02-16 DIAGNOSIS — J4489 Other specified chronic obstructive pulmonary disease: Secondary | ICD-10-CM

## 2022-02-19 ENCOUNTER — Ambulatory Visit (INDEPENDENT_AMBULATORY_CARE_PROVIDER_SITE_OTHER): Payer: 59

## 2022-02-19 DIAGNOSIS — Z0000 Encounter for general adult medical examination without abnormal findings: Secondary | ICD-10-CM | POA: Diagnosis not present

## 2022-02-19 NOTE — Patient Instructions (Signed)
  Ms. Taunton , Thank you for taking time to come for your Medicare Wellness Visit. I appreciate your ongoing commitment to your health goals. Please review the following plan we discussed and let me know if I can assist you in the future.   These are the goals we discussed:  Goals      Increase physical activity     Patient Stated     Quit smoking.     Quit smoking / using tobacco     Starting 01/04/2016 patient would like to cut back to 4 cigarettes a day.        This is a list of the screening recommended for you and due dates:  Health Maintenance  Topic Date Due   COVID-19 Vaccine (6 - 2023-24 season) 01/04/2022   DTaP/Tdap/Td vaccine (2 - Td or Tdap) 01/27/2022   Screening for Lung Cancer  03/22/2022   Medicare Annual Wellness Visit  02/20/2023   Pneumonia Vaccine  Completed   Flu Shot  Completed   DEXA scan (bone density measurement)  Completed   Hepatitis C Screening: USPSTF Recommendation to screen - Ages 44-79 yo.  Completed   Zoster (Shingles) Vaccine  Completed   HPV Vaccine  Aged Out   Colon Cancer Screening  Discontinued

## 2022-02-19 NOTE — Progress Notes (Signed)
Subjective:   [name] is a 77 y.o. female who presents for Medicare Annual (Subsequent) preventive examination.  Review of Systems    I connected with  [name] on 02/19/22 by a audio enabled telemedicine application and verified that I am speaking with the correct person using two identifiers.  Patient Location: Home  Provider Location: Office/Clinic  I discussed the limitations of evaluation and management by telemedicine. The patient expressed understanding and agreed to proceed.        Objective:    There were no vitals filed for this visit. There is no height or weight on file to calculate BMI.     11/13/2021   11:25 AM 08/31/2021   11:28 AM 02/14/2021    3:00 PM 08/25/2020   11:04 AM 07/12/2020   10:35 AM 07/10/2020    9:40 AM 12/04/2019    9:32 AM  Advanced Directives  Does Patient Have a Medical Advance Directive? No No No Yes No Yes Yes  Type of Scientist, research (medical);Living will [place];Living will [place];Living will [place]  Does patient want to make changes to medical advance directive?    No - Patient declined     Copy of [place] in Chart?    No - copy requested No - copy requested No - copy requested No - copy requested  Would patient like information on creating a medical advance directive?  No - Patient declined No - Patient declined    No - Patient declined    Current Medications (verified) Outpatient Encounter Medications as of 02/19/2022  Medication Sig   acetaminophen (TYLENOL) 325 MG tablet Take 650 mg by mouth every 6 (six) hours as needed for moderate pain.    albuterol (PROVENTIL) (2.5 MG/3ML) 0.083% nebulizer solution INHALE ONE VIAL VIA NEBULIZER THREE TIMES DAILY. (Patient taking differently: Take 2.5 mg by nebulization 3 (three) times daily as needed for wheezing or shortness of breath.)   albuterol (VENTOLIN HFA) 108 (90 Base)  MCG/ACT inhaler INHALE (2) PUFFS EVERY FOURHOURS AS NEEDED ONLY IF YOU CANT CATCH YOUR BREATH   amLODipine (NORVASC) 2.5 MG tablet Take 1 tablet (2.5 mg total) by mouth daily.   amLODipine (NORVASC) 5 MG tablet TAKE (1) TABLET BY MOUOTH ONCE DAILY.   aspirin EC 81 MG tablet Take 81 mg by mouth daily.   ATROVENT HFA 17 MCG/ACT inhaler INHALE 2 PUFFS BY MOUTH FOUR TIMES A DAY.   benzonatate (TESSALON) 100 MG capsule Take 1 capsule (100 mg total) by mouth 2 (two) times daily as needed for cough.   Calcium Carbonate-Vitamin D (CALCIUM 600 + D PO) Take 1 tablet by mouth 2 (two) times daily.   Cyanocobalamin (VITAMIN B 12) 500 MCG TABS Take one tablet by mouth once daily   diclofenac Sodium (VOLTAREN) 1 % GEL Apply 2 g topically 4 (four) times daily.   Emollient (VASELINE INTENSIVE CARE EX) Apply 1 application topically daily.   erythromycin base (E-MYCIN) 500 MG tablet Take 1 tablet (500 mg total) by mouth 3 (three) times daily.   fluticasone (FLONASE) 50 MCG/ACT nasal spray PLACE 2 SPRAYS IN EACH NOSTRIL DAILY.   Misc. Devices MISC Please provide patient with mastectomy bra and prosthesis. Dx: History of left breast cancer   montelukast (SINGULAIR) 10 MG tablet TAKE 1 TABLET BY MOUTH AT BEDTIME.   Naphazoline HCl (CLEAR EYES OP) Place 1 drop into both eyes  daily as needed (irritation).   nicotine (NICODERM CQ - DOSED IN MG/24 HR) 7 mg/24hr patch Place 1 patch (7 mg total) onto the skin daily.   pantoprazole (PROTONIX) 40 MG tablet TAKE ONE TABLET BY MOUTH ONCE DAILY BEFORE BREAKFAST.   polyethylene glycol powder (GLYCOLAX/MIRALAX) powder MIX 1 CAPFUL IN 8 OUNCES OF JUICE OR WATER AND DRINK ONCE DAILY. (Patient taking differently: Take 17 g by mouth daily.)   Potassium 99 MG TABS Take 99 mg by mouth 1 day or 1 dose.   rosuvastatin (CRESTOR) 10 MG tablet TAKE 1 TABLET BY MOUTH ONCE A DAY.   spironolactone (ALDACTONE) 50 MG tablet TAKE 1 TABLET BY MOUTH ONCE A DAY.   SYMBICORT 160-4.5 MCG/ACT inhaler  INHALE 2 PUFFS INTO THE LUNGS TWICE DAILY.   traMADol (ULTRAM) 50 MG tablet One tablet every eight hours as needed for pain.   No facility-administered encounter medications on file as of 02/19/2022.    Allergies (verified) Penicillins, Sulfonamide derivatives, and Avapro [irbesartan]   History: Past Medical History:  Diagnosis Date   Breast cancer ([place]) 2007   Stage I (T1b N0 M0), grade 1 well-differentiated carcinoma of the left breast status, post lumpectomy followed by radiation therapy. Her estrogen receptor receptors were 93%, progesterone receptors 67%. HER-2/neu was negative. No lymphovascular space invasion was seen. All margins were clear. Ki-67 marker was low at 1% with surgery on 11/15/2004. Treated then with post-lumpectomy radiation, finish   Breast cancer, left breast ([place]) 2007   COPD (chronic obstructive pulmonary disease) ([place])    Diabetes mellitus without complication ([place])    diet controlled   Diverticula of colon    Hx of rickettsial disease    Hyperlipidemia    Hypertension    Kidney stones    Nicotine dependence    Osteoarthritis    Past Surgical History:  Procedure Laterality Date   ABDOMINAL HYSTERECTOMY     BREAST SURGERY  2005 approx   left lumpectomy    CATARACT EXTRACTION W/PHACO Left 05/01/2019   Procedure: CATARACT EXTRACTION PHACO AND INTRAOCULAR LENS PLACEMENT LEFT EYE CDE=11.73;  Surgeon: Baruch Goldmann, MD;  Location: [facility];  Service: Ophthalmology;  Laterality: Left;  left   CATARACT EXTRACTION W/PHACO Right 05/15/2019   Procedure: CATARACT EXTRACTION PHACO AND INTRAOCULAR LENS PLACEMENT RIGHT EYE;  Surgeon: Baruch Goldmann, MD;  Location: [facility];  Service: Ophthalmology;  Laterality: Right;  CDE: 16.07   CHOLECYSTECTOMY N/A 05/26/2014   Procedure: LAPAROSCOPIC CHOLECYSTECTOMY;  Surgeon: Aviva Signs Md, MD;  Location: [facility];  Service: General;  Laterality: N/A;   COLECTOMY     2005, diverticulitis   COLONOSCOPY N/A 05/20/2017    Surgeon: Danie Binder, MD;  five 3-6 mm polyps (4 tubular adenomas, 1 hyperplastic polyp), tortuous left colon, internal hemorrhoids.  Recommended repeat colonoscopy in 3 years.   COLONOSCOPY WITH PROPOFOL N/A 07/12/2020   Procedure: COLONOSCOPY WITH PROPOFOL;  Surgeon: Eloise Harman, DO;  [facility];  Service: Endoscopy;  Laterality: N/A;  12:30pm   DILATION AND CURETTAGE OF UTERUS     ESOPHAGOGASTRODUODENOSCOPY N/A 05/20/2017   Surgeon: Danie Binder, MD; normal esophagus and mild gastritis due to aspirin.  Biopsies negative for H. pylori.   left breast      cancer, in 2006   POLYPECTOMY  07/12/2020   Procedure: POLYPECTOMY;  Surgeon: Eloise Harman, DO;  Location: [facility];  Service: Endoscopy;;   TUBAL LIGATION     Family History  Problem Relation Age  of Onset   Breast cancer Mother    COPD Father    Emphysema Father    Throat cancer Sister    Glaucoma Brother    Schizophrenia Brother    Heart attack Brother    Lung cancer Sister        former smoker   Hepatitis C Daughter    Seizures Daughter    SIDS Son    Colon cancer Neg Hx    Colon polyps Neg Hx    Social History   Socioeconomic History   Marital status: Single    Spouse name: Not on file   Number of children: 5   Years of education: 9th grade    Highest education level: 9th grade  Occupational History   Occupation: retired    Fish farm manager: RETIRED  Tobacco Use   Smoking status: Every Day    Packs/day: 0.50    Years: 50.00    Total pack years: 25.00    Types: Cigarettes   Smokeless tobacco: Never   Tobacco comments:    smokes 1/2 pack per day 11/19/2019  Vaping Use   Vaping Use: Never used  Substance and Sexual Activity   Alcohol use: No    Alcohol/week: 0.0 standard drinks of alcohol   Drug use: No   Sexual activity: Not Currently  Other Topics Concern   Not on file  Social History Narrative   ** Merged History Encounter **       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  QUIT WORKING 2003 AFTER CANCER DIAGNOSIS.  MARRIED WITH A RUN AWAY HUSBAND(FUGITIVE 10 YRS). 3 MISCARRIAGES, 5 CHILDREN   Social Determinants of Health   Financial Resource Strain: Low Risk  (12/04/2019)   Overall Financial Resource Strain (CARDIA)    Difficulty of Paying Living Expenses: Not hard at all  Food Insecurity: No Food Insecurity (12/04/2019)   Hunger Vital Sign    Worried About Running Out of Food in the Last Year: Never true    Ran Out of Food in the Last Year: Never true  Transportation Needs: No Transportation Needs (12/04/2019)   PRAPARE - Hydrologist (Medical): No    Lack of Transportation (Non-Medical): No  Physical Activity: Sufficiently Active (12/04/2019)   Exercise Vital Sign    Days of Exercise per Week: 7 days    Minutes of Exercise per Session: 60 min  Stress: No Stress Concern Present (12/04/2019)   [place]    Feeling of Stress : Not at all  Social Connections: Moderately Isolated (12/04/2019)   Social Connection and Isolation Panel [NHANES]    Frequency of Communication with Friends and Family: More than three times a week    Frequency of Social Gatherings with Friends and Family: Once a week    Attends Religious Services: Never    Marine scientist or Organizations: No    Attends Archivist Meetings: Never    Marital Status: Married    Tobacco Counseling Ready to quit: Not Answered Counseling given: Not Answered Tobacco comments: smokes 1/2 pack per day 11/19/2019   Clinical Intake:    Diabetic?NO    Activities of Daily Living     No data to display           Patient Care Team: [name], MD as PCP - General Branch, Alphonse Guild, MD as PCP - Cardiology (Cardiology) Sanjuana Kava, MD as Consulting Physician (Orthopedic Surgery)  [place], Kelby Fam, MD (Inactive) as Consulting Physician (Hematology and  Oncology) Eloise Harman, DO as Consulting Physician (Internal Medicine)  Indicate any recent Medical Services you may have received from other than [facility] providers in the past year (date may be approximate).     Assessment:   This is a routine wellness examination for Tasha.  Hearing/Vision screen No results found.  Dietary issues and exercise activities discussed:     Goals Addressed   None   Depression Screen    01/18/2022   11:03 AM 12/01/2021    1:57 PM 11/23/2021    2:59 PM 07/19/2021    2:26 PM 06/13/2021   10:45 AM 02/14/2021    3:04 PM 02/01/2021   10:49 AM  PHQ 2/9 Scores  PHQ - 2 Score 0 0 0 0 0 0 0    Fall Risk    01/18/2022   11:03 AM 12/01/2021    1:56 PM 11/23/2021    2:59 PM 07/19/2021    2:26 PM 06/13/2021   10:45 AM  [place] in the past year? 0 1 1 0 1  Number falls in past yr: 0 0 1 0 0  Injury with Fall? 0 0 1 0 1  Risk for fall due to : No Fall Risks History of fall(s) History of fall(s) No Fall Risks Impaired balance/gait;History of fall(s)  Follow up Falls evaluation completed Falls evaluation completed Falls evaluation completed Falls evaluation completed Falls evaluation completed    [place]:  Any stairs in or around the home? Yes  If so, are there any without handrails? No  Home free of loose throw rugs in walkways, pet beds, electrical cords, etc? Yes  Adequate lighting in your home to reduce risk of falls? Yes   ASSISTIVE DEVICES UTILIZED TO PREVENT FALLS:  Life alert? No  Use of a cane, walker or w/c? No  Grab bars in the bathroom? Yes  Shower chair or bench in shower? Yes  Elevated toilet seat or a handicapped toilet? No           02/14/2021    3:10 PM 12/04/2019    9:34 AM 12/03/2018    2:36 PM 11/25/2017    2:09 PM 11/12/2016    2:31 PM  6CIT Screen  What Year? 0 points 0 points 0 points 0 points 0 points  What month? 0 points 0 points 0 points 0 points 0 points  What time? 0  points 0 points 0 points 0 points 0 points  Count back from 20 0 points 0 points 0 points 0 points 0 points  Months in reverse 2 points 0 points 2 points 2 points 0 points  Repeat phrase 0 points 0 points 4 points 0 points 0 points  Total Score 2 points 0 points 6 points 2 points 0 points    Immunizations Immunization History  Administered Date(s) Administered   Fluad Quad(high Dose 65+) 09/09/2018, 12/29/2019, 11/24/2020, 10/11/2021   Influenza Split 10/31/2011   Influenza Whole 11/18/2008, 10/12/2010   Influenza,inj,Quad PF,6+ Mos 11/12/2012, 09/18/2013, 12/22/2014, 10/04/2015, 09/09/2017   Moderna Sars-Covid-2 Vaccination 11/25/2020, 11/09/2021   PFIZER(Purple Top)SARS-COV-2 Vaccination 03/23/2019, 04/22/2019, 01/23/2020   PPD Test 01/28/2012   Pneumococcal Conjugate-13 01/04/2015   Pneumococcal Polysaccharide-23 06/13/2010   Tdap 01/28/2012   Zoster Recombinat (Shingrix) 11/04/2017, 01/24/2018    TDAP status: Due, Education has been provided regarding the importance of this vaccine. Advised may receive this vaccine at local pharmacy or  Health Dept. Aware to provide a copy of the vaccination record if obtained from local pharmacy or Health Dept. Verbalized acceptance and understanding.  Flu Vaccine status: Up to date  Pneumococcal vaccine status: Up to date  Covid-19 vaccine status: Completed vaccines  Qualifies for Shingles Vaccine? Yes   Zostavax completed Yes   Shingrix Completed?: Yes  Screening Tests Health Maintenance  Topic Date Due   COVID-19 Vaccine (6 - 2023-24 season) 01/04/2022   DTaP/Tdap/Td (2 - Td or Tdap) 01/27/2022   Lung Cancer Screening  03/22/2022   Medicare Annual Wellness (AWV)  02/20/2023   Pneumonia Vaccine 25+ Years old  Completed   INFLUENZA VACCINE  Completed   DEXA SCAN  Completed   Hepatitis C Screening  Completed   Zoster Vaccines- Shingrix  Completed   HPV VACCINES  Aged Out   COLONOSCOPY (Pts 45-46yr Insurance coverage will need to  be confirmed)  Discontinued    Health Maintenance  Health Maintenance Due  Topic Date Due   COVID-19 Vaccine (6 - 2023-24 season) 01/04/2022   DTaP/Tdap/Td (2 - Td or Tdap) 01/27/2022    Colorectal cancer screening: No longer required.   Mammogram status: No longer required due to AGE.  Bone Density status: Completed 04/18/20. Results reflect: Bone density results: OSTEOPENIA. Repeat every 2 years.  Lung Cancer Screening: (Low Dose CT Chest recommended if Age 78-80years, 30 pack-year currently smoking OR have quit w/in 15years.) does qualify.   Lung Cancer Screening Referral: 03/21/21  Additional Screening:  Hepatitis C Screening: does qualify; Completed 07/21/09  Vision Screening: Recommended annual ophthalmology exams for early detection of glaucoma and other disorders of the eye. Is the patient up to date with their annual eye exam?  No  Who is the provider or what is the name of the office in which the patient attends annual eye exams? Dr WGeorgann HousekeeperIf pt is not established with a provider, would they like to be referred to a provider to establish care? No .   Dental Screening: Recommended annual dental exams for proper oral hygiene  Community Resource Referral / Chronic Care Management: CRR required this visit?  No   CCM required this visit?  No      Plan:     I have personally reviewed and noted the following in the patient's chart:   Medical and social history Use of alcohol, tobacco or illicit drugs  Current medications and supplements including opioid prescriptions. Patient is not currently taking opioid prescriptions. Functional ability and status Nutritional status Physical activity Advanced directives List of other physicians Hospitalizations, surgeries, and ER visits in previous 12 months Vitals Screenings to include cognitive, depression, and falls Referrals and appointments  In addition, I have reviewed and discussed with patient certain  preventive protocols, quality metrics, and best practice recommendations. A written personalized care plan for preventive services as well as general preventive health recommendations were provided to patient.     KQuentin Angst CProsser  02/19/2022

## 2022-02-20 ENCOUNTER — Other Ambulatory Visit: Payer: Self-pay | Admitting: Family Medicine

## 2022-02-26 ENCOUNTER — Other Ambulatory Visit: Payer: Self-pay | Admitting: Family Medicine

## 2022-02-28 ENCOUNTER — Telehealth: Payer: Self-pay | Admitting: Family Medicine

## 2022-02-28 NOTE — Telephone Encounter (Signed)
Chart is updated

## 2022-02-28 NOTE — Telephone Encounter (Signed)
Patient states that she has had [place] vaccine for 2023/2024   Has lost documentation from pharm.

## 2022-03-01 DIAGNOSIS — M159 Polyosteoarthritis, unspecified: Secondary | ICD-10-CM | POA: Diagnosis not present

## 2022-03-01 DIAGNOSIS — J449 Chronic obstructive pulmonary disease, unspecified: Secondary | ICD-10-CM | POA: Diagnosis not present

## 2022-03-01 DIAGNOSIS — E119 Type 2 diabetes mellitus without complications: Secondary | ICD-10-CM | POA: Diagnosis not present

## 2022-03-15 DIAGNOSIS — E118 Type 2 diabetes mellitus with unspecified complications: Secondary | ICD-10-CM | POA: Diagnosis not present

## 2022-03-20 ENCOUNTER — Other Ambulatory Visit: Payer: Self-pay | Admitting: Family Medicine

## 2022-03-20 DIAGNOSIS — J4489 Other specified chronic obstructive pulmonary disease: Secondary | ICD-10-CM

## 2022-03-20 DIAGNOSIS — R918 Other nonspecific abnormal finding of lung field: Secondary | ICD-10-CM

## 2022-03-23 ENCOUNTER — Ambulatory Visit (HOSPITAL_COMMUNITY)
Admission: RE | Admit: 2022-03-23 | Discharge: 2022-03-23 | Disposition: A | Discharge status: Home / Self Care | Payer: 59 | Source: Ambulatory Visit | Attending: Physician Assistant | Admitting: Physician Assistant

## 2022-03-23 DIAGNOSIS — Z122 Encounter for screening for malignant neoplasm of respiratory organs: Secondary | ICD-10-CM | POA: Diagnosis not present

## 2022-03-23 DIAGNOSIS — Z87891 Personal history of nicotine dependence: Secondary | ICD-10-CM | POA: Insufficient documentation

## 2022-03-23 DIAGNOSIS — F17210 Nicotine dependence, cigarettes, uncomplicated: Secondary | ICD-10-CM | POA: Diagnosis not present

## 2022-03-29 ENCOUNTER — Telehealth: Payer: Self-pay | Admitting: Orthopaedic Surgery

## 2022-03-29 NOTE — Telephone Encounter (Signed)
Patient lvm requesting a refill on her Tramadol '50mg'$  to be sent to [facility].

## 2022-03-30 MED ORDER — TRAMADOL HCL 50 MG PO TABS: ORAL_TABLET | ORAL | 3 refills | Status: DC

## 2022-04-03 ENCOUNTER — Encounter: Payer: Self-pay | Admitting: Orthopaedic Surgery

## 2022-04-03 ENCOUNTER — Ambulatory Visit (INDEPENDENT_AMBULATORY_CARE_PROVIDER_SITE_OTHER): Payer: 59 | Admitting: Orthopaedic Surgery

## 2022-04-03 VITALS — BP 170/76 | HR 103

## 2022-04-03 DIAGNOSIS — F17210 Nicotine dependence, cigarettes, uncomplicated: Secondary | ICD-10-CM

## 2022-04-03 DIAGNOSIS — S76302D Unspecified injury of muscle, fascia and tendon of the posterior muscle group at thigh level, left thigh, subsequent encounter: Secondary | ICD-10-CM | POA: Diagnosis not present

## 2022-04-03 MED ORDER — CYCLOBENZAPRINE HCL 10 MG PO TABS: 10.0000 mg | ORAL_TABLET | Freq: Every day | ORAL | 0 refills | Status: DC

## 2022-04-03 MED ORDER — TRAMADOL HCL 50 MG PO TABS: ORAL_TABLET | ORAL | 3 refills | Status: DC

## 2022-04-03 NOTE — Progress Notes (Signed)
I have those days at times.  She has good and bad days with the left leg and knee pain and the hamstring pain.  She has no new trauma, no redness, no numbness.  She is active.  Left knee has slight effusion, crepitus, ROM 0 to 110, tender lateral hamstring left, NV intact. Slight limp left.  No distal edema.  Encounter Diagnoses  Name Primary?   Hamstring injury, left, subsequent encounter Yes   Cigarette nicotine dependence without complication    I have reviewed the [place] web site prior to prescribing narcotic medicine for this patient.  I have refilled Flexeril.  Return in three months.  Call if any problem.  Precautions discussed.  Electronically Signed Sanjuana Kava, MD 3/19/20241:33 PM

## 2022-04-06 ENCOUNTER — Other Ambulatory Visit: Payer: Self-pay

## 2022-04-06 ENCOUNTER — Inpatient Hospital Stay (HOSPITAL_COMMUNITY)
Admission: EM | Admit: 2022-04-06 | Discharge: 2022-04-17 | DRG: 871 | Disposition: A | Discharge status: Home / Self Care | Payer: 59 | Attending: Family Medicine | Admitting: Family Medicine

## 2022-04-06 ENCOUNTER — Emergency Department (HOSPITAL_COMMUNITY): Payer: 59

## 2022-04-06 ENCOUNTER — Encounter (HOSPITAL_COMMUNITY): Payer: Self-pay | Admitting: Emergency Medicine

## 2022-04-06 DIAGNOSIS — F17210 Nicotine dependence, cigarettes, uncomplicated: Secondary | ICD-10-CM | POA: Diagnosis not present

## 2022-04-06 DIAGNOSIS — K5730 Diverticulosis of large intestine without perforation or abscess without bleeding: Secondary | ICD-10-CM | POA: Diagnosis not present

## 2022-04-06 DIAGNOSIS — J441 Chronic obstructive pulmonary disease with (acute) exacerbation: Secondary | ICD-10-CM | POA: Diagnosis present

## 2022-04-06 DIAGNOSIS — R0902 Hypoxemia: Secondary | ICD-10-CM | POA: Diagnosis not present

## 2022-04-06 DIAGNOSIS — J440 Chronic obstructive pulmonary disease with acute lower respiratory infection: Secondary | ICD-10-CM | POA: Diagnosis not present

## 2022-04-06 DIAGNOSIS — Z803 Family history of malignant neoplasm of breast: Secondary | ICD-10-CM

## 2022-04-06 DIAGNOSIS — A4189 Other specified sepsis: Principal | ICD-10-CM | POA: Diagnosis present

## 2022-04-06 DIAGNOSIS — Z882 Allergy status to sulfonamides status: Secondary | ICD-10-CM

## 2022-04-06 DIAGNOSIS — E785 Hyperlipidemia, unspecified: Secondary | ICD-10-CM | POA: Diagnosis not present

## 2022-04-06 DIAGNOSIS — Z515 Encounter for palliative care: Secondary | ICD-10-CM

## 2022-04-06 DIAGNOSIS — J9601 Acute respiratory failure with hypoxia: Secondary | ICD-10-CM | POA: Diagnosis not present

## 2022-04-06 DIAGNOSIS — I358 Other nonrheumatic aortic valve disorders: Secondary | ICD-10-CM | POA: Diagnosis not present

## 2022-04-06 DIAGNOSIS — Z7951 Long term (current) use of inhaled steroids: Secondary | ICD-10-CM

## 2022-04-06 DIAGNOSIS — Z818 Family history of other mental and behavioral disorders: Secondary | ICD-10-CM

## 2022-04-06 DIAGNOSIS — Z90710 Acquired absence of both cervix and uterus: Secondary | ICD-10-CM

## 2022-04-06 DIAGNOSIS — Z87442 Personal history of urinary calculi: Secondary | ICD-10-CM

## 2022-04-06 DIAGNOSIS — M1990 Unspecified osteoarthritis, unspecified site: Secondary | ICD-10-CM | POA: Diagnosis not present

## 2022-04-06 DIAGNOSIS — I10 Essential (primary) hypertension: Secondary | ICD-10-CM | POA: Diagnosis present

## 2022-04-06 DIAGNOSIS — I5021 Acute systolic (congestive) heart failure: Secondary | ICD-10-CM | POA: Diagnosis not present

## 2022-04-06 DIAGNOSIS — J449 Chronic obstructive pulmonary disease, unspecified: Secondary | ICD-10-CM | POA: Diagnosis present

## 2022-04-06 DIAGNOSIS — J129 Viral pneumonia, unspecified: Secondary | ICD-10-CM | POA: Diagnosis present

## 2022-04-06 DIAGNOSIS — Z8249 Family history of ischemic heart disease and other diseases of the circulatory system: Secondary | ICD-10-CM

## 2022-04-06 DIAGNOSIS — Z7901 Long term (current) use of anticoagulants: Secondary | ICD-10-CM

## 2022-04-06 DIAGNOSIS — I4891 Unspecified atrial fibrillation: Secondary | ICD-10-CM | POA: Diagnosis not present

## 2022-04-06 DIAGNOSIS — G9341 Metabolic encephalopathy: Secondary | ICD-10-CM | POA: Diagnosis not present

## 2022-04-06 DIAGNOSIS — I255 Ischemic cardiomyopathy: Secondary | ICD-10-CM | POA: Diagnosis not present

## 2022-04-06 DIAGNOSIS — T380X5A Adverse effect of glucocorticoids and synthetic analogues, initial encounter: Secondary | ICD-10-CM | POA: Diagnosis not present

## 2022-04-06 DIAGNOSIS — I4892 Unspecified atrial flutter: Secondary | ICD-10-CM | POA: Diagnosis not present

## 2022-04-06 DIAGNOSIS — Z825 Family history of asthma and other chronic lower respiratory diseases: Secondary | ICD-10-CM

## 2022-04-06 DIAGNOSIS — Z7189 Other specified counseling: Secondary | ICD-10-CM | POA: Diagnosis not present

## 2022-04-06 DIAGNOSIS — Z7982 Long term (current) use of aspirin: Secondary | ICD-10-CM

## 2022-04-06 DIAGNOSIS — E1165 Type 2 diabetes mellitus with hyperglycemia: Secondary | ICD-10-CM | POA: Diagnosis not present

## 2022-04-06 DIAGNOSIS — E8729 Other acidosis: Secondary | ICD-10-CM | POA: Diagnosis not present

## 2022-04-06 DIAGNOSIS — R059 Cough, unspecified: Secondary | ICD-10-CM | POA: Diagnosis not present

## 2022-04-06 DIAGNOSIS — R9431 Abnormal electrocardiogram [ECG] [EKG]: Secondary | ICD-10-CM | POA: Diagnosis not present

## 2022-04-06 DIAGNOSIS — Z83511 Family history of glaucoma: Secondary | ICD-10-CM

## 2022-04-06 DIAGNOSIS — R197 Diarrhea, unspecified: Secondary | ICD-10-CM | POA: Diagnosis not present

## 2022-04-06 DIAGNOSIS — J4489 Other specified chronic obstructive pulmonary disease: Secondary | ICD-10-CM | POA: Diagnosis not present

## 2022-04-06 DIAGNOSIS — I480 Paroxysmal atrial fibrillation: Secondary | ICD-10-CM | POA: Diagnosis present

## 2022-04-06 DIAGNOSIS — J9602 Acute respiratory failure with hypercapnia: Secondary | ICD-10-CM | POA: Diagnosis not present

## 2022-04-06 DIAGNOSIS — Z79899 Other long term (current) drug therapy: Secondary | ICD-10-CM

## 2022-04-06 DIAGNOSIS — I700 Atherosclerosis of aorta: Secondary | ICD-10-CM | POA: Diagnosis present

## 2022-04-06 DIAGNOSIS — I5020 Unspecified systolic (congestive) heart failure: Secondary | ICD-10-CM | POA: Diagnosis not present

## 2022-04-06 DIAGNOSIS — Z808 Family history of malignant neoplasm of other organs or systems: Secondary | ICD-10-CM

## 2022-04-06 DIAGNOSIS — Z801 Family history of malignant neoplasm of trachea, bronchus and lung: Secondary | ICD-10-CM

## 2022-04-06 DIAGNOSIS — Z853 Personal history of malignant neoplasm of breast: Secondary | ICD-10-CM

## 2022-04-06 DIAGNOSIS — I110 Hypertensive heart disease with heart failure: Secondary | ICD-10-CM | POA: Diagnosis not present

## 2022-04-06 DIAGNOSIS — Z743 Need for continuous supervision: Secondary | ICD-10-CM | POA: Diagnosis not present

## 2022-04-06 DIAGNOSIS — R000 Tachycardia, unspecified: Secondary | ICD-10-CM | POA: Diagnosis present

## 2022-04-06 DIAGNOSIS — R110 Nausea: Secondary | ICD-10-CM | POA: Diagnosis not present

## 2022-04-06 DIAGNOSIS — I2510 Atherosclerotic heart disease of native coronary artery without angina pectoris: Secondary | ICD-10-CM | POA: Diagnosis present

## 2022-04-06 DIAGNOSIS — Z1152 Encounter for screening for COVID-19: Secondary | ICD-10-CM | POA: Diagnosis not present

## 2022-04-06 DIAGNOSIS — Z888 Allergy status to other drugs, medicaments and biological substances status: Secondary | ICD-10-CM

## 2022-04-06 DIAGNOSIS — R54 Age-related physical debility: Secondary | ICD-10-CM | POA: Diagnosis present

## 2022-04-06 DIAGNOSIS — R918 Other nonspecific abnormal finding of lung field: Secondary | ICD-10-CM | POA: Diagnosis not present

## 2022-04-06 DIAGNOSIS — Z880 Allergy status to penicillin: Secondary | ICD-10-CM

## 2022-04-06 DIAGNOSIS — J189 Pneumonia, unspecified organism: Principal | ICD-10-CM

## 2022-04-06 DIAGNOSIS — E7849 Other hyperlipidemia: Secondary | ICD-10-CM | POA: Diagnosis not present

## 2022-04-06 HISTORY — DX: Atherosclerotic heart disease of native coronary artery without angina pectoris: I25.10

## 2022-04-06 HISTORY — DX: Atherosclerosis of aorta: I70.0

## 2022-04-06 LAB — COMPREHENSIVE METABOLIC PANEL
ALT: 18 U/L (ref 0–44)
AST: 23 U/L (ref 15–41)
Albumin: 3.6 g/dL (ref 3.5–5.0)
Alkaline Phosphatase: 64 U/L (ref 38–126)
Anion gap: 10 (ref 5–15)
BUN: 32 mg/dL — ABNORMAL HIGH (ref 8–23)
CO2: 26 mmol/L (ref 22–32)
Calcium: 9.3 mg/dL (ref 8.9–10.3)
Chloride: 98 mmol/L (ref 98–111)
Creatinine, Ser: 0.96 mg/dL (ref 0.44–1.00)
GFR, Estimated: >60 mL/min (ref >60)
Glucose, Bld: 138 mg/dL — ABNORMAL HIGH (ref 70–99)
Potassium: 3.8 mmol/L (ref 3.5–5.1)
Sodium: 134 mmol/L — ABNORMAL LOW (ref 135–145)
Total Bilirubin: 0.8 mg/dL (ref 0.3–1.2)
Total Protein: 8 g/dL (ref 6.5–8.1)

## 2022-04-06 LAB — URINALYSIS, W/ REFLEX TO CULTURE (INFECTION SUSPECTED)
Bilirubin Urine: NEGATIVE
Glucose, UA: NEGATIVE mg/dL
Ketones, ur: NEGATIVE mg/dL
Nitrite: NEGATIVE
Protein, ur: 100 mg/dL — AB
Specific Gravity, Urine: 1.018 (ref 1.005–1.030)
pH: 6 (ref 5.0–8.0)

## 2022-04-06 LAB — PROCALCITONIN: Procalcitonin: 0.26 ng/mL

## 2022-04-06 LAB — CBC WITH DIFFERENTIAL/PLATELET
Abs Immature Granulocytes: 0.11 10*3/uL — ABNORMAL HIGH (ref 0.00–0.07)
Basophils Absolute: 0.1 10*3/uL (ref 0.0–0.1)
Basophils Relative: 0 %
Eosinophils Absolute: 0.6 10*3/uL — ABNORMAL HIGH (ref 0.0–0.5)
Eosinophils Relative: 3 %
HCT: 45.3 % (ref 36.0–46.0)
Hemoglobin: 14.5 g/dL (ref 12.0–15.0)
Immature Granulocytes: 1 %
Lymphocytes Relative: 8 %
Lymphs Abs: 1.5 10*3/uL (ref 0.7–4.0)
MCH: 28.5 pg (ref 26.0–34.0)
MCHC: 32 g/dL (ref 30.0–36.0)
MCV: 89.2 fL (ref 80.0–100.0)
Monocytes Absolute: 2.4 10*3/uL — ABNORMAL HIGH (ref 0.1–1.0)
Monocytes Relative: 12 %
Neutro Abs: 14.9 10*3/uL — ABNORMAL HIGH (ref 1.7–7.7)
Neutrophils Relative %: 76 %
Platelets: 391 10*3/uL (ref 150–400)
RBC: 5.08 MIL/uL (ref 3.87–5.11)
RDW: 14.4 % (ref 11.5–15.5)
WBC: 19.6 10*3/uL — ABNORMAL HIGH (ref 4.0–10.5)
nRBC: 0 % (ref 0.0–0.2)

## 2022-04-06 LAB — RESP PANEL BY RT-PCR (RSV, FLU A&B, COVID)  RVPGX2
Influenza A by PCR: NEGATIVE
Influenza B by PCR: NEGATIVE
Resp Syncytial Virus by PCR: NEGATIVE
SARS Coronavirus 2 by RT PCR: NEGATIVE

## 2022-04-06 LAB — EXPECTORATED SPUTUM ASSESSMENT W GRAM STAIN, RFLX TO RESP C

## 2022-04-06 LAB — LACTIC ACID, PLASMA
Lactic Acid, Venous: 1.7 mmol/L (ref 0.5–1.9)
Lactic Acid, Venous: 1.2 mmol/L (ref 0.5–1.9)

## 2022-04-06 LAB — PROTIME-INR
INR: 1.1 (ref 0.8–1.2)
Prothrombin Time: 14 seconds (ref 11.4–15.2)

## 2022-04-06 LAB — APTT: aPTT: 32 seconds (ref 24–36)

## 2022-04-06 LAB — TSH: TSH: 0.501 u[IU]/mL (ref 0.350–4.500)

## 2022-04-06 LAB — TROPONIN I (HIGH SENSITIVITY)
Troponin I (High Sensitivity): 50 ng/L — ABNORMAL HIGH (ref <18)
Troponin I (High Sensitivity): 50 ng/L — ABNORMAL HIGH (ref <18)

## 2022-04-06 MED ORDER — ENOXAPARIN SODIUM 40 MG/0.4ML IJ SOSY
40.0000 mg | PREFILLED_SYRINGE | INTRAMUSCULAR | Status: DC
  Administered 2022-04-06 – 2022-04-07 (×2): 40 mg via SUBCUTANEOUS
  Filled 2022-04-06 (×2): qty 0.4

## 2022-04-06 MED ORDER — LEVALBUTEROL HCL 0.63 MG/3ML IN NEBU
0.6300 mg | INHALATION_SOLUTION | Freq: Four times a day (QID) | RESPIRATORY_TRACT | Status: DC | PRN
  Administered 2022-04-07: 0.63 mg via RESPIRATORY_TRACT
  Filled 2022-04-06: qty 3

## 2022-04-06 MED ORDER — MOMETASONE FURO-FORMOTEROL FUM 200-5 MCG/ACT IN AERO
2.0000 | INHALATION_SPRAY | Freq: Two times a day (BID) | RESPIRATORY_TRACT | Status: DC
  Administered 2022-04-07 – 2022-04-17 (×21): 2 via RESPIRATORY_TRACT
  Filled 2022-04-06 (×2): qty 8.8

## 2022-04-06 MED ORDER — ROSUVASTATIN CALCIUM 10 MG PO TABS
10.0000 mg | ORAL_TABLET | Freq: Every day | ORAL | Status: DC
  Administered 2022-04-06 – 2022-04-17 (×12): 10 mg via ORAL
  Filled 2022-04-06 (×12): qty 1

## 2022-04-06 MED ORDER — LEVOFLOXACIN IN D5W 750 MG/150ML IV SOLN
750.0000 mg | Freq: Once | INTRAVENOUS | Status: AC
  Administered 2022-04-06: 750 mg via INTRAVENOUS
  Filled 2022-04-06: qty 150

## 2022-04-06 MED ORDER — SODIUM CHLORIDE 0.9 % IV BOLUS
1000.0000 mL | Freq: Once | INTRAVENOUS | Status: AC
  Administered 2022-04-06: 1000 mL via INTRAVENOUS

## 2022-04-06 MED ORDER — DM-GUAIFENESIN ER 30-600 MG PO TB12
1.0000 | ORAL_TABLET | Freq: Two times a day (BID) | ORAL | Status: DC
  Administered 2022-04-06 – 2022-04-09 (×6): 1 via ORAL
  Filled 2022-04-06 (×6): qty 1

## 2022-04-06 MED ORDER — ACETAMINOPHEN 325 MG PO TABS: 650.0000 mg | ORAL_TABLET | Freq: Four times a day (QID) | ORAL | Status: DC | PRN

## 2022-04-06 MED ORDER — MOMETASONE FURO-FORMOTEROL FUM 200-5 MCG/ACT IN AERO: 2.0000 | INHALATION_SPRAY | Freq: Two times a day (BID) | RESPIRATORY_TRACT | Status: DC

## 2022-04-06 MED ORDER — VITAMIN B-12 1000 MCG PO TABS
500.0000 ug | ORAL_TABLET | Freq: Every day | ORAL | Status: DC
  Administered 2022-04-06 – 2022-04-17 (×12): 500 ug via ORAL
  Filled 2022-04-06 (×12): qty 1

## 2022-04-06 MED ORDER — SODIUM CHLORIDE 0.9 % IV SOLN
100.0000 mg | Freq: Two times a day (BID) | INTRAVENOUS | Status: DC
  Administered 2022-04-07 – 2022-04-12 (×10): 100 mg via INTRAVENOUS
  Filled 2022-04-06 (×13): qty 100

## 2022-04-06 MED ORDER — TRAMADOL HCL 50 MG PO TABS
50.0000 mg | ORAL_TABLET | Freq: Three times a day (TID) | ORAL | Status: DC | PRN
  Administered 2022-04-07 – 2022-04-16 (×9): 50 mg via ORAL
  Filled 2022-04-06 (×9): qty 1

## 2022-04-06 MED ORDER — SODIUM CHLORIDE 0.9% FLUSH
3.0000 mL | Freq: Two times a day (BID) | INTRAVENOUS | Status: DC
  Administered 2022-04-06 – 2022-04-17 (×21): 3 mL via INTRAVENOUS

## 2022-04-06 MED ORDER — ONDANSETRON HCL 4 MG/2ML IJ SOLN: 4.0000 mg | Freq: Four times a day (QID) | INTRAMUSCULAR | Status: DC | PRN

## 2022-04-06 MED ORDER — ONDANSETRON HCL 4 MG PO TABS: 4.0000 mg | ORAL_TABLET | Freq: Four times a day (QID) | ORAL | Status: DC | PRN

## 2022-04-06 MED ORDER — ACETAMINOPHEN 650 MG RE SUPP: 650.0000 mg | Freq: Four times a day (QID) | RECTAL | Status: DC | PRN

## 2022-04-06 MED ORDER — ACETAMINOPHEN 325 MG PO TABS
650.0000 mg | ORAL_TABLET | Freq: Once | ORAL | Status: AC
  Administered 2022-04-06: 650 mg via ORAL
  Filled 2022-04-06: qty 2

## 2022-04-06 MED ORDER — ASPIRIN 81 MG PO TBEC
81.0000 mg | DELAYED_RELEASE_TABLET | Freq: Every day | ORAL | Status: DC
  Administered 2022-04-07 – 2022-04-12 (×6): 81 mg via ORAL
  Filled 2022-04-06 (×6): qty 1

## 2022-04-06 MED ORDER — SODIUM CHLORIDE 0.9 % IV BOLUS
500.0000 mL | Freq: Once | INTRAVENOUS | Status: AC
  Administered 2022-04-06: 500 mL via INTRAVENOUS

## 2022-04-06 MED ORDER — DILTIAZEM HCL 25 MG/5ML IV SOLN
10.0000 mg | Freq: Once | INTRAVENOUS | Status: AC
  Administered 2022-04-06: 10 mg via INTRAVENOUS
  Filled 2022-04-06: qty 5

## 2022-04-06 MED ORDER — SODIUM CHLORIDE 0.9% FLUSH: 3.0000 mL | INTRAVENOUS | Status: DC | PRN

## 2022-04-06 MED ORDER — POLYETHYLENE GLYCOL 3350 17 G PO PACK: 17.0000 g | PACK | Freq: Every day | ORAL | Status: DC | PRN

## 2022-04-06 MED ORDER — PANTOPRAZOLE SODIUM 40 MG PO TBEC
40.0000 mg | DELAYED_RELEASE_TABLET | Freq: Every day | ORAL | Status: DC | PRN
  Administered 2022-04-15: 40 mg via ORAL

## 2022-04-06 MED ORDER — SODIUM CHLORIDE 0.9% FLUSH
3.0000 mL | Freq: Two times a day (BID) | INTRAVENOUS | Status: DC
  Administered 2022-04-06 – 2022-04-10 (×9): 3 mL via INTRAVENOUS

## 2022-04-06 MED ORDER — DILTIAZEM HCL-DEXTROSE 125-5 MG/125ML-% IV SOLN (PREMIX)
5.0000 mg/h | INTRAVENOUS | Status: DC
  Administered 2022-04-06 – 2022-04-07 (×2): 5 mg/h via INTRAVENOUS
  Filled 2022-04-06 (×2): qty 125

## 2022-04-06 MED ORDER — POLYETHYLENE GLYCOL 3350 17 GM/SCOOP PO POWD
17.0000 g | Freq: Every day | ORAL | Status: DC
  Filled 2022-04-06: qty 255

## 2022-04-06 MED ORDER — LORAZEPAM 2 MG/ML IJ SOLN
0.5000 mg | Freq: Two times a day (BID) | INTRAMUSCULAR | Status: DC | PRN
  Administered 2022-04-06 – 2022-04-07 (×2): 0.5 mg via INTRAVENOUS
  Filled 2022-04-06 (×3): qty 1

## 2022-04-06 MED ORDER — TRAZODONE HCL 50 MG PO TABS
50.0000 mg | ORAL_TABLET | Freq: Every evening | ORAL | Status: DC | PRN
  Administered 2022-04-07 – 2022-04-12 (×5): 50 mg via ORAL
  Filled 2022-04-06 (×5): qty 1

## 2022-04-06 MED ORDER — BISACODYL 10 MG RE SUPP: 10.0000 mg | Freq: Every day | RECTAL | Status: DC | PRN

## 2022-04-06 MED ORDER — SODIUM CHLORIDE 0.9 % IV SOLN: INTRAVENOUS | Status: DC | PRN

## 2022-04-06 MED ORDER — HEPARIN SODIUM (PORCINE) 5000 UNIT/ML IJ SOLN: 5000.0000 [IU] | Freq: Three times a day (TID) | INTRAMUSCULAR | Status: DC

## 2022-04-06 MED ORDER — CYCLOBENZAPRINE HCL 10 MG PO TABS
10.0000 mg | ORAL_TABLET | Freq: Every day | ORAL | Status: DC
  Administered 2022-04-06 – 2022-04-11 (×6): 10 mg via ORAL
  Filled 2022-04-06 (×6): qty 1

## 2022-04-06 MED ORDER — SPIRONOLACTONE 25 MG PO TABS
50.0000 mg | ORAL_TABLET | Freq: Every day | ORAL | Status: DC
  Administered 2022-04-06 – 2022-04-08 (×3): 50 mg via ORAL
  Filled 2022-04-06 (×3): qty 2

## 2022-04-06 MED ORDER — SODIUM CHLORIDE 0.9 % IV SOLN
500.0000 mg | INTRAVENOUS | Status: DC
  Administered 2022-04-06: 500 mg via INTRAVENOUS
  Filled 2022-04-06: qty 5

## 2022-04-06 MED ORDER — METHYLPREDNISOLONE SODIUM SUCC 40 MG IJ SOLR
40.0000 mg | Freq: Two times a day (BID) | INTRAMUSCULAR | Status: DC
  Administered 2022-04-06 – 2022-04-12 (×12): 40 mg via INTRAVENOUS
  Filled 2022-04-06 (×12): qty 1

## 2022-04-06 MED ORDER — SODIUM CHLORIDE 0.9 % IV SOLN
2.0000 g | INTRAVENOUS | Status: AC
  Administered 2022-04-06 – 2022-04-12 (×7): 2 g via INTRAVENOUS
  Filled 2022-04-06 (×6): qty 20

## 2022-04-06 MED ORDER — OYSTER SHELL CALCIUM/D3 500-5 MG-MCG PO TABS
1.0000 | ORAL_TABLET | Freq: Two times a day (BID) | ORAL | Status: DC
  Administered 2022-04-06 – 2022-04-17 (×22): 1 via ORAL
  Filled 2022-04-06 (×22): qty 1

## 2022-04-06 NOTE — ED Triage Notes (Signed)
Pt reports having been sick with respiratory symptoms for 3 days. Pt reports that family has similar symptoms but they have recovered and she has not.  PT presents with an 02 sat of 84% on room air and 90% on 2L.  Pt has a productive cough with yellw/green thick mucus. Pt has a history of COPD and a current 1/2 pack daily smoker. She has a fever of 100.4. She reported a dose of tylenol and tramadol at 0700

## 2022-04-06 NOTE — ED Provider Notes (Signed)
[facility] Provider Note   CSN: [id number] Arrival date & time: 04/06/22  1229     History  Chief Complaint  Patient presents with   Shortness of Breath    [name] is a 77 y.o. female.  COPD patient presents with cough and shortness of breath.  She has had a yellow sputum production and a fever and has a history of COPD  The history is provided by the patient and medical records. No language interpreter was used.  Shortness of Breath Severity:  Moderate Onset quality:  Sudden Timing:  Constant Progression:  Worsening Chronicity:  New Context: activity   Context: not animal exposure   Relieved by:  Nothing Ineffective treatments:  None tried Associated symptoms: fever   Associated symptoms: no abdominal pain, no chest pain, no cough, no headaches and no rash        Home Medications Prior to Admission medications   Medication Sig Start Date End Date Taking? Authorizing Provider  acetaminophen (TYLENOL) 325 MG tablet Take 650 mg by mouth every 6 (six) hours as needed for moderate pain.     [provider]  albuterol (PROVENTIL) (2.5 MG/3ML) 0.083% nebulizer solution INHALE ONE VIAL VIA NEBULIZER THREE TIMES DAILY. Patient taking differently: Take 2.5 mg by nebulization 3 (three) times daily as needed for wheezing or shortness of breath. 07/08/18   Perlie Mayo, NP  albuterol (VENTOLIN HFA) 108 (90 Base) MCG/ACT inhaler INHALE (2) PUFFS EVERY FOURHOURS AS NEEDED ONLY IF YOU CANT CATCH YOUR BREATH 05/12/21   Lindell Spar, MD  amLODipine (NORVASC) 5 MG tablet TAKE (1) TABLET BY MOUOTH ONCE DAILY. 02/21/22   [name], MD  aspirin EC 81 MG tablet Take 81 mg by mouth daily.    [provider]  ATROVENT HFA 17 MCG/ACT inhaler INHALE 2 PUFFS BY MOUTH FOUR TIMES A DAY. 03/20/22   [name], MD  benzonatate (TESSALON) 100 MG capsule Take 1 capsule (100 mg total) by mouth 2 (two) times daily as  needed for cough. 01/18/22   [name], MD  Calcium Carbonate-Vitamin D (CALCIUM 600 + D PO) Take 1 tablet by mouth 2 (two) times daily.    [provider]  Cyanocobalamin (VITAMIN B 12) 500 MCG TABS Take one tablet by mouth once daily 08/18/20   [name], MD  cyclobenzaprine (FLEXERIL) 10 MG tablet Take 1 tablet (10 mg total) by mouth at bedtime. One tablet every night at bedtime as needed for spasm. 04/03/22   Sanjuana Kava, MD  diclofenac Sodium (VOLTAREN) 1 % GEL Apply 2 g topically 4 (four) times daily. 11/13/21   Fransico Meadow, MD  Emollient (VASELINE INTENSIVE CARE EX) Apply 1 application topically daily.    [provider]  erythromycin base (E-MYCIN) 500 MG tablet Take 1 tablet (500 mg total) by mouth 3 (three) times daily. 01/18/22   [name], MD  fluticasone (FLONASE) 50 MCG/ACT nasal spray PLACE 2 SPRAYS IN EACH NOSTRIL DAILY. 03/20/22   [name], MD  Misc. Devices MISC Please provide patient with mastectomy bra and prosthesis. Dx: History of left breast cancer 08/25/20   Derek Jack, MD  montelukast (SINGULAIR) 10 MG tablet TAKE 1 TABLET BY MOUTH AT BEDTIME. 12/18/21   [name], MD  Naphazoline HCl (CLEAR EYES OP) Place 1 drop into both eyes daily as needed (irritation).    [provider]  nicotine (NICODERM CQ -  DOSED IN MG/24 HR) 7 mg/24hr patch Place 1 patch (7 mg total) onto the skin daily. 01/18/22   [name], MD  pantoprazole (PROTONIX) 40 MG tablet TAKE ONE TABLET BY MOUTH ONCE DAILY BEFORE BREAKFAST. 12/15/21   Tanda Rockers, MD  polyethylene glycol powder (GLYCOLAX/MIRALAX) powder MIX 1 CAPFUL IN 8 OUNCES OF JUICE OR WATER AND DRINK ONCE DAILY. Patient taking differently: Take 17 g by mouth daily. 04/23/16   [name], MD  Potassium 99 MG TABS Take 99 mg by mouth 1 day or 1 dose.    [provider]  rosuvastatin (CRESTOR) 10 MG tablet TAKE 1 TABLET BY MOUTH ONCE A  DAY. 12/18/21   [name], MD  spironolactone (ALDACTONE) 50 MG tablet TAKE 1 TABLET BY MOUTH ONCE A DAY. 02/16/22   [name], MD  SYMBICORT 160-4.5 MCG/ACT inhaler INHALE 2 PUFFS INTO THE LUNGS TWICE DAILY. 03/20/22   [name], MD  traMADol (ULTRAM) 50 MG tablet One tablet every eight hours as needed for pain. 04/03/22   Sanjuana Kava, MD      Allergies    Penicillins, Sulfonamide derivatives, Avapro [irbesartan], and Levaquin [levofloxacin]    Review of Systems   Review of Systems  Constitutional:  Positive for fever. Negative for appetite change and fatigue.  HENT:  Negative for congestion, ear discharge and sinus pressure.   Eyes:  Negative for discharge.  Respiratory:  Positive for shortness of breath. Negative for cough.   Cardiovascular:  Negative for chest pain.  Gastrointestinal:  Negative for abdominal pain and diarrhea.  Genitourinary:  Negative for frequency and hematuria.  Musculoskeletal:  Negative for back pain.  Skin:  Negative for rash.  Neurological:  Negative for seizures and headaches.  Psychiatric/Behavioral:  Negative for hallucinations.     Physical Exam Updated Vital Signs BP (!) 94/53   Pulse (!) 122   Temp (!) 100.4 F (38 C) (Oral)   Resp (!) 26   Ht 5\' 5"  (1.651 m)   Wt 72.6 kg   SpO2 92%   BMI 26.63 kg/m  Physical Exam Vitals and nursing note reviewed.  Constitutional:      Appearance: She is well-developed.  HENT:     Head: Normocephalic.  Eyes:     General: No scleral icterus.    Conjunctiva/sclera: Conjunctivae normal.  Neck:     Thyroid: No thyromegaly.  Cardiovascular:     Rate and Rhythm: Regular rhythm. Tachycardia present.     Heart sounds: No murmur heard.    No friction rub. No gallop.  Pulmonary:     Breath sounds: No stridor. Rales present. No wheezing.  Chest:     Chest wall: No tenderness.  Abdominal:     General: There is no distension.     Tenderness: There is no abdominal tenderness.  There is no rebound.  Musculoskeletal:        General: Normal range of motion.     Cervical back: Neck supple.  Lymphadenopathy:     Cervical: No cervical adenopathy.  Skin:    Findings: No erythema or rash.  Neurological:     Mental Status: She is alert and oriented to person, place, and time.     Motor: No abnormal muscle tone.     Coordination: Coordination normal.  Psychiatric:        Behavior: Behavior normal.     ED Results / Procedures / Treatments   Labs (all labs ordered are listed, but only  abnormal results are displayed) Labs Reviewed  COMPREHENSIVE METABOLIC PANEL - Abnormal; Notable for the following components:      Result Value   Sodium 134 (*)    Glucose, Bld 138 (*)    BUN 32 (*)    All other components within normal limits  CBC WITH DIFFERENTIAL/PLATELET - Abnormal; Notable for the following components:   WBC 19.6 (*)    Neutro Abs 14.9 (*)    Monocytes Absolute 2.4 (*)    Eosinophils Absolute 0.6 (*)    Abs Immature Granulocytes 0.11 (*)    All other components within normal limits  TROPONIN I (HIGH SENSITIVITY) - Abnormal; Notable for the following components:   Troponin I (High Sensitivity) 50 (*)    All other components within normal limits  RESP PANEL BY RT-PCR (RSV, FLU A&B, COVID)  RVPGX2  CULTURE, BLOOD (ROUTINE X 2)  CULTURE, BLOOD (ROUTINE X 2)  LACTIC ACID, PLASMA  PROTIME-INR  APTT  LACTIC ACID, PLASMA  URINALYSIS, W/ REFLEX TO CULTURE (INFECTION SUSPECTED)  PROCALCITONIN    EKG EKG Interpretation  Date/Time:  Friday April 06 2022 13:58:12 EDT Ventricular Rate:  152 PR Interval:    QRS Duration: 119 QT Interval:  322 QTC Calculation: 527 R Axis:   -22 Text Interpretation: Atrial fibrillation with rapid V-rate Incomplete left bundle branch block Probable left ventricular hypertrophy Artifact in lead(s) I II III aVR aVL aVF V4 V5 V6 Confirmed by Milton Ferguson [id number]) on 04/06/2022 2:04:16 PM  Radiology DG Chest Port 1  View  Result Date: 04/06/2022 CLINICAL DATA:  Provided history: Questionable sepsis-evaluate for abnormality. EXAM: PORTABLE CHEST 1 VIEW COMPARISON:  Chest CT 03/23/2022. Prior chest radiographs 11/09/2021 and earlier. FINDINGS: Heart size within normal limits. Aortic atherosclerosis. Prominence of the interstitial lung markings, new from the prior chest radiographs of 11/09/2021. No appreciable airspace consolidation. No evidence of pleural effusion or pneumothorax. No acute osseous abnormality identified. Degenerative changes of the spine. IMPRESSION: Prominence of the interstitial lung markings, new from the prior chest radiographs of 11/09/2021. This may reflect interstitial edema or sequela of atypical/viral pneumonia. Aortic Atherosclerosis (ICD10-I70.0). Electronically Signed   By: Kellie Simmering D.O.   On: 04/06/2022 13:52    Procedures Procedures   Medications Ordered in ED Medications  diltiazem (CARDIZEM) 125 mg in dextrose 5% 125 mL (1 mg/mL) infusion (5 mg/hr Intravenous New Bag/Given 04/06/22 1435)  cefTRIAXone (ROCEPHIN) 2 g in sodium chloride 0.9 % 100 mL IVPB (0 g Intravenous Stopped 04/06/22 1451)  azithromycin (ZITHROMAX) 500 mg in sodium chloride 0.9 % 250 mL IVPB (500 mg Intravenous New Bag/Given 04/06/22 1451)  methylPREDNISolone sodium succinate (SOLU-MEDROL) 40 mg/mL injection 40 mg (has no administration in time range)  levofloxacin (LEVAQUIN) IVPB 750 mg (0 mg Intravenous Stopped 04/06/22 1438)  sodium chloride 0.9 % bolus 1,000 mL (1,000 mLs Intravenous New Bag/Given 04/06/22 1358)  acetaminophen (TYLENOL) tablet 650 mg (650 mg Oral Given 04/06/22 1357)  diltiazem (CARDIZEM) injection 10 mg (10 mg Intravenous Given 04/06/22 1432)    ED Course/ Medical Decision Making/ A&P   CRITICAL CARE Performed by: Milton Ferguson Total critical care time: 45 minutes Critical care time was exclusive of separately billable procedures and treating other patients. Critical care was necessary  to treat or prevent imminent or life-threatening deterioration. Critical care was time spent personally by me on the following activities: development of treatment plan with patient and/or surrogate as well as nursing, discussions with consultants, evaluation of patient's response to treatment, examination of patient,  obtaining history from patient or surrogate, ordering and performing treatments and interventions, ordering and review of laboratory studies, ordering and review of radiographic studies, pulse oximetry and re-evaluation of patient's condition.    Patient with COPD exacerbation and respiratory infection with fever.  She has been started on antibiotics and cultured.  Patient went into atrial fibs and has been started on Cardizem }                          Medical Decision Making Amount and/or Complexity of Data Reviewed Labs: ordered. Radiology: ordered. ECG/medicine tests: ordered.  Risk OTC drugs. Prescription drug management. Decision regarding hospitalization.  This patient presents to the ED for concern of fever and cough, this involves an extensive number of treatment options, and is a complaint that carries with it a high risk of complications and morbidity.  The differential diagnosis includes pneumonia   Co morbidities that complicate the patient evaluation  COPD   Additional history obtained:  Additional history obtained from patient External records from outside source obtained and reviewed including hospital records   Lab Tests:  I Ordered, and personally interpreted labs.  The pertinent results include: Sodium 124, count 19,000   Imaging Studies ordered:  I ordered imaging studies including chest x-ray I independently visualized and interpreted imaging which showed atypical pneumonia I agree with the radiologist interpretation   Cardiac Monitoring: / EKG:  The patient was maintained on a cardiac monitor.  I personally viewed and interpreted the  cardiac monitored which showed an underlying rhythm of: Normal sinus rhythm initially and then atrial fibs   Consultations Obtained:  I requested consultation with the hospitalist,  and discussed lab and imaging findings as well as pertinent plan - they recommend: Admit   Problem List / ED Course / Critical interventions / Medication management  Atypical respiratory infection, COPD, atrial fibs I ordered medication including antibiotics and Cardizem Reevaluation of the patient after these medicines showed that the patient improved I have reviewed the patients home medicines and have made adjustments as needed   Social Determinants of Health:  None   Test / Admission - Considered:  None  COPD exacerbation with hypoxia and respiratory infection and new onset atrial fibs.  Patient is admitted to medicine   Final Clinical Impression(s) / ED Diagnoses Final diagnoses:  Community acquired pneumonia, unspecified laterality  Atrial fibrillation with RVR ([place])    Rx / DC Orders ED Discharge Orders     None         Milton Ferguson, MD 04/06/22 1747

## 2022-04-06 NOTE — Progress Notes (Signed)
[facility]link is following code sepsis 

## 2022-04-06 NOTE — Progress Notes (Addendum)
Pharmacy Antibiotic Note  [name] is a 77 y.o. female admitted on 04/06/2022 for CAP.  Pharmacy has been consulted to evaluate appropriateness of levaquin. Patient has a history of penicillin allergy. Her grand-daughter who is her aid and manages her medications was unable to provide any additional information however in reviewing her medication history, she received rocephin 3 years ago as well as multiple courses for cephalexin. Given this history, will adjust her antibiotics to preferred agent.  Patient did receive one dose of levaquin in the ED. Given her prolonged QTc of 527, would avoid further quinolones and macrolides.    Plan: Ceftriaxone 2gm IV q 24h Recommend doxycycline 100 mg IV q 12  Height: 5\' 5"  (165.1 cm) Weight: 72.6 kg (160 lb) IBW/kg (Calculated) : 57  Temp (24hrs), Avg:100.4 F (38 C), Min:100.4 F (38 C), Max:100.4 F (38 C)  Recent Labs  Lab 04/06/22 1300 04/06/22 1350  WBC 19.6*  --   CREATININE 0.96  --   LATICACIDVEN  --  1.7    Estimated Creatinine Clearance: 49 mL/min (by C-G formula based on SCr of 0.96 mg/dL).    Allergies  Allergen Reactions   Penicillins Hives, Shortness Of Breath and Swelling    Has patient had a PCN reaction causing immediate rash, facial/tongue/throat swelling, SOB or lightheadedness with hypotension: yes Has patient had a PCN reaction causing severe rash involving mucus membranes or skin necrosis:no Has patient had a PCN reaction that required hospitalization: yes Has patient had a PCN reaction occurring within the last 10 years: no If all of the above answers are "NO", then may proceed with Cephalosporin use.   Sulfonamide Derivatives Other (See Comments)    Shaking all over, seizure like symptoms. Hospitalization resulted    Avapro [Irbesartan] Other (See Comments)    Pt associates avapro with bruising though aware scientifically not the case, wants to chanmge med    Antimicrobials this admission: 3/22 Levofloxacin  x1  Microbiology results: 3/22 Resp panel 3/22 Bcx  Thank you for allowing pharmacy to be a part of this patient's care.  Lorenso Courier, PharmD Clinical Pharmacist 04/06/2022 2:16 PM

## 2022-04-06 NOTE — H&P (Addendum)
Patient Demographics:    [name], is a 77 y.o. female  MRN: [id number]   DOB - 06/12/1945  Admit Date - 04/06/2022  Outpatient Primary MD for the patient is [name], MD   Assessment & Plan:   Assessment and Plan:  1) acute COPD exacerbation--- secondary to respiratory infection -IV steroids bronchodilators mucolytic's and Rocephin/doxycycline as ordered  2) possible CAP--- patient with productive cough with colored sputum, low-grade fevers and leukocytosis --Chest x-ray suggestive of possible atypical/viral pneumonia, procalcitonin 0.26 -WBC is 19.6 patient has a history of persistent leukocytosis, but this is higher than baseline, lactic acid is not elevated -Antibiotics and bronchodilators as above #1  3) new onset A-fib with RVR---While in the ED patient is found to be in A-fib with RVR which is new for her -Initial troponin is 50, repeat troponin is 50 -TSH WNL -Initial EKG with sinus tach, while in the ED patient was noted to be very tachycardic on the monitor with concerns for A-fib repeat EKG suggested A-fib with RVR -Echo requested -IV Cardizem for rate control -Defer anticoagulation at this time  4) acute hypoxic respiratory failure--- due to #1, #2 #3 above --in the ED patient is found to be hypoxic with O2 sats of 84% on room air and 90% on 2 L with productive   5)HTN--hold amlodipine as patient needs Cardizem for rate control  6)DM2-recent A1c 6.3% reflecting excellent diabetic control PTA -Anticipate worsening glycemic control while on steroids for #1 above Use Novolog/Humalog Sliding scale insulin with Accu-Cheks/Fingersticks as ordered   Dispo: The patient is from: Home              Anticipated d/c is to: Home              Anticipated d/c date is: 1 day               Patient currently is not medically stable to d/c. Barriers: Not Clinically Stable-   With History of - Reviewed by me  Past Medical History:  Diagnosis Date   Breast cancer ([place]) 2007   Stage I (T1b N0 M0), grade 1 well-differentiated carcinoma of the left breast status, post lumpectomy followed by radiation therapy. Her estrogen receptor receptors were 93%, progesterone receptors 67%. HER-2/neu was negative. No lymphovascular space invasion was seen. All margins were clear. Ki-67 marker was low at 1% with surgery on 11/15/2004. Treated then with post-lumpectomy radiation, finish   Breast cancer, left breast ([place]) 2007   COPD (chronic obstructive pulmonary disease) ([place])    Diabetes mellitus without complication (HCC)    diet controlled   Diverticula of colon    Hx of rickettsial disease    Hyperlipidemia    Hypertension    Kidney stones    Nicotine dependence    Osteoarthritis       Past Surgical History:  Procedure Laterality Date   ABDOMINAL HYSTERECTOMY     BREAST SURGERY  2005 approx   left lumpectomy    CATARACT EXTRACTION W/PHACO Left 05/01/2019   Procedure: CATARACT EXTRACTION PHACO AND INTRAOCULAR LENS PLACEMENT LEFT EYE CDE=11.73;  Surgeon: Baruch Goldmann, MD;  Location: [facility];  Service: Ophthalmology;  Laterality: Left;  left   CATARACT EXTRACTION W/PHACO Right 05/15/2019   Procedure: CATARACT EXTRACTION PHACO AND INTRAOCULAR LENS PLACEMENT RIGHT EYE;  Surgeon: Baruch Goldmann, MD;  Location: [facility];  Service: Ophthalmology;  Laterality: Right;  CDE: 16.07   CHOLECYSTECTOMY N/A 05/26/2014   Procedure: LAPAROSCOPIC CHOLECYSTECTOMY;  Surgeon: Aviva Signs Md, MD;  Location: [facility];  Service: General;  Laterality: N/A;   COLECTOMY     2005, diverticulitis   COLONOSCOPY N/A 05/20/2017    Surgeon: Danie Binder, MD;  five 3-6 mm polyps (4 tubular adenomas, 1 hyperplastic polyp), tortuous left colon, internal hemorrhoids.  Recommended repeat colonoscopy in 3 years.    COLONOSCOPY WITH PROPOFOL N/A 07/12/2020   Procedure: COLONOSCOPY WITH PROPOFOL;  Surgeon: Eloise Harman, DO;  Location: [facility];  Service: Endoscopy;  Laterality: N/A;  12:30pm   DILATION AND CURETTAGE OF UTERUS     ESOPHAGOGASTRODUODENOSCOPY N/A 05/20/2017   Surgeon: Danie Binder, MD; normal esophagus and mild gastritis due to aspirin.  Biopsies negative for H. pylori.   left breast      cancer, in 2006   POLYPECTOMY  07/12/2020   Procedure: POLYPECTOMY;  Surgeon: Eloise Harman, DO;  [facility]: [facility];  Service: Endoscopy;;   TUBAL LIGATION      Chief Complaint  Patient presents with   Shortness of Breath      HPI:    [name]  is a 77 y.o. female with past medical history relevant for COPD/asthma in the setting of ongoing tobacco use, as well as history of left breast cancer, diabetes type 2 and hypertension who presents to the ED with URI symptoms at least for the last 3 days -Multiple for members have had URI symptoms but patient symptoms are persistent-in the ED patient is found to be hypoxic with O2 sats of 84% on room air and 90% on 2 L with productive cough with yellow to green thick sputum -Patient is found to be febrile in the ED -While in the ED patient is found to be in A-fib with RVR which is new for her - Patient has nausea but no vomiting -Denies frank chest pains -Chest x-ray suggestive of possible atypical/viral pneumonia, procalcitonin 0.26 -Initial troponin is 50, repeat troponin is 50 -WBC is 19.6 patient has a history of persistent leukocytosis, but this is higher than baseline, lactic acid is not elevated -Creatinine 0.96 and sodium is 134 -Initial EKG with sinus tach, while in the ED patient was noted to be very tachycardic on the monitor with concerns for A-fib repeat EKG suggested A-fib with RVR    Review of systems:    In addition to the HPI above,   A full Review of  Systems was done, all other systems reviewed are negative  except as noted above in HPI , .    Social History:  Reviewed by me    Social History   Tobacco Use   Smoking status: Every Day    Packs/day: 0.50    Years: 50.00    Additional pack years: 0.00    Total pack years: 25.00    Types: Cigarettes   Smokeless tobacco: Never   Tobacco comments:    smokes 1/2 pack per day 11/19/2019  Substance  Use Topics   Alcohol use: No    Alcohol/week: 0.0 standard drinks of alcohol     Family History :  Reviewed by me    Family History  Problem Relation Age of Onset   Breast cancer Mother    COPD Father    Emphysema Father    Throat cancer Sister    Glaucoma Brother    Schizophrenia Brother    Heart attack Brother    Lung cancer Sister        former smoker   Hepatitis C Daughter    Seizures Daughter    SIDS Son    Colon cancer Neg Hx    Colon polyps Neg Hx      Home Medications:   Prior to Admission medications   Medication Sig Start Date End Date Taking? Authorizing Provider  acetaminophen (TYLENOL) 325 MG tablet Take 650 mg by mouth every 6 (six) hours as needed for moderate pain.    Yes [provider]  albuterol (PROVENTIL) (2.5 MG/3ML) 0.083% nebulizer solution INHALE ONE VIAL VIA NEBULIZER THREE TIMES DAILY. Patient taking differently: Take 2.5 mg by nebulization 3 (three) times daily as needed for wheezing or shortness of breath. 07/08/18  Yes Perlie Mayo, NP  albuterol (VENTOLIN HFA) 108 (90 Base) MCG/ACT inhaler INHALE (2) PUFFS EVERY FOURHOURS AS NEEDED ONLY IF YOU CANT CATCH YOUR BREATH Patient taking differently: Inhale 2 puffs into the lungs every 4 (four) hours as needed for wheezing or shortness of breath. INHALE (2) PUFFS EVERY FOURHOURS AS NEEDED ONLY IF YOU CANT CATCH YOUR BREATH 05/12/21  Yes Lindell Spar, MD  amLODipine (NORVASC) 5 MG tablet TAKE (1) TABLET BY MOUOTH ONCE DAILY. Patient taking differently: Take 5 mg by mouth daily. TAKE (1) TABLET BY MOUOTH ONCE DAILY. 02/21/22  Yes [name], MD  aspirin EC 81 MG tablet Take 81 mg by mouth daily.   Yes [provider]  ATROVENT HFA 17 MCG/ACT inhaler INHALE 2 PUFFS BY MOUTH FOUR TIMES A DAY. Patient taking differently: Inhale 2 puffs into the lungs in the morning, at noon, in the evening, and at bedtime. INHALE 2 PUFFS BY MOUTH FOUR TIMES A DAY. 03/20/22  Yes [name], MD  Calcium Carbonate-Vitamin D (CALCIUM 600 + D PO) Take 1 tablet by mouth 2 (two) times daily.   Yes [provider]  Cyanocobalamin (VITAMIN B 12) 500 MCG TABS Take 500 mcg by mouth daily. Take one tablet by mouth once daily 08/18/20  Yes [name], MD  cyclobenzaprine (FLEXERIL) 10 MG tablet Take 1 tablet (10 mg total) by mouth at bedtime. One tablet every night at bedtime as needed for spasm. 04/03/22  Yes Sanjuana Kava, MD  fluticasone (FLONASE) 50 MCG/ACT nasal spray PLACE 2 SPRAYS IN EACH NOSTRIL DAILY. Patient taking differently: Place 2 sprays into both nostrils daily. 03/20/22  Yes [name], MD  montelukast (SINGULAIR) 10 MG tablet TAKE 1 TABLET BY MOUTH AT BEDTIME. 12/18/21  Yes [name], MD  Naphazoline HCl (CLEAR EYES OP) Place 1 drop into both eyes daily as needed (irritation).   Yes [provider]  nicotine (NICODERM CQ - DOSED IN MG/24 HR) 7 mg/24hr patch Place 1 patch (7 mg total) onto the skin daily. 01/18/22  Yes [name], MD  pantoprazole (PROTONIX) 40 MG tablet TAKE ONE TABLET BY MOUTH ONCE DAILY BEFORE BREAKFAST. Patient taking differently: Take 40 mg by mouth daily as needed (acid reflux). 12/15/21  Yes Tanda Rockers, MD  polyethylene glycol powder (GLYCOLAX/MIRALAX) powder MIX 1 CAPFUL IN 8 OUNCES OF JUICE OR WATER AND DRINK ONCE DAILY. Patient taking differently: Take 17 g by mouth daily. 04/23/16  Yes [name], MD  Potassium 99 MG TABS Take 99 mg by mouth daily.   Yes [provider]  rosuvastatin (CRESTOR) 10 MG tablet TAKE 1 TABLET BY MOUTH ONCE A  DAY. Patient taking differently: Take 10 mg by mouth daily. 12/18/21  Yes [name], MD  spironolactone (ALDACTONE) 50 MG tablet TAKE 1 TABLET BY MOUTH ONCE A DAY. Patient taking differently: Take 50 mg by mouth daily. 02/16/22  Yes [name], MD  SYMBICORT 160-4.5 MCG/ACT inhaler INHALE 2 PUFFS INTO THE LUNGS TWICE DAILY. Patient taking differently: Inhale 2 puffs into the lungs 2 (two) times daily. 03/20/22  Yes [name], MD  traMADol (ULTRAM) 50 MG tablet One tablet every eight hours as needed for pain. Patient taking differently: Take 50 mg by mouth every 8 (eight) hours as needed for moderate pain or severe pain. One tablet every eight hours as needed for pain. 04/03/22  Yes Sanjuana Kava, MD  benzonatate (TESSALON) 100 MG capsule Take 1 capsule (100 mg total) by mouth 2 (two) times daily as needed for cough. Patient not taking: Reported on 04/06/2022 01/18/22   [name], MD  diclofenac Sodium (VOLTAREN) 1 % GEL Apply 2 g topically 4 (four) times daily. Patient not taking: Reported on 04/06/2022 11/13/21   Fransico Meadow, MD  erythromycin base (E-MYCIN) 500 MG tablet Take 1 tablet (500 mg total) by mouth 3 (three) times daily. Patient not taking: Reported on 04/06/2022 01/18/22   [name], MD  Misc. Devices MISC Please provide patient with mastectomy bra and prosthesis. Dx: History of left breast cancer 08/25/20   Derek Jack, MD     Allergies:     Allergies  Allergen Reactions   Penicillins Hives, Shortness Of Breath and Swelling    Has patient had a PCN reaction causing immediate rash, facial/tongue/throat swelling, SOB or lightheadedness with hypotension: yes Has patient had a PCN reaction causing severe rash involving mucus membranes or skin necrosis:no Has patient had a PCN reaction that required hospitalization: yes Has patient had a PCN reaction occurring within the last 10 years: no If all of the above answers are "NO", then  may proceed with Cephalosporin use.   Sulfonamide Derivatives Other (See Comments)    Shaking all over, seizure like symptoms. Hospitalization resulted    Avapro [Irbesartan] Other (See Comments)    Pt associates avapro with bruising though aware scientifically not the case, wants to chanmge med   Levaquin [Levofloxacin] Itching     Physical Exam:   Vitals  Blood pressure 134/69, pulse (!) 104, temperature (!) 100.4 F (38 C), temperature source Oral, resp. rate (!) 31, height 5\' 5"  (1.651 m), weight 72.6 kg, SpO2 92 %.  Physical Examination: General appearance - alert,  in no distress Mental status - alert, oriented to person, place, and time Nose- [place] 2L/min Eyes - sclera anicteric Neck - supple, no JVD elevation , Chest -diminished breath sounds with scattered wheezes bilaterally  heart - S1 and S2 normal, irregularly irregular  abdomen - soft, nontender, nondistended, +BS Neurological - screening mental status exam normal, neck supple without rigidity, cranial nerves II through XII intact, DTR's normal and symmetric Extremities - no pedal edema noted, intact peripheral pulses  Skin - warm, dry  Data Review:    CBC Recent Labs  Lab 04/06/22 1300  WBC 19.6*  HGB 14.5  HCT 45.3  PLT 391  MCV 89.2  MCH 28.5  MCHC 32.0  RDW 14.4  LYMPHSABS 1.5  MONOABS 2.4*  EOSABS 0.6*  BASOSABS 0.1   ------------------------------------------------------------------------------------------------------------------  Chemistries  Recent Labs  Lab 04/06/22 1300  NA 134*  K 3.8  CL 98  CO2 26  GLUCOSE 138*  BUN 32*  CREATININE 0.96  CALCIUM 9.3  AST 23  ALT 18  ALKPHOS 64  BILITOT 0.8   ------------------------------------------------------------------------------------------------------------------ estimated creatinine clearance is 49 mL/min (by C-G formula based on SCr of 0.96  mg/dL). ------------------------------------------------------------------------------------------------------------------ Recent Labs    04/06/22 1552  TSH 0.501     Coagulation profile Recent Labs  Lab 04/06/22 1300  INR 1.1   ------------------------------------------------------------------------------------------------------------------- ------------------------------------------------------------------------------------------------------------------    Component Value Date/Time   BNP 42.0 09/15/2018 1107   Urinalysis    Component Value Date/Time   COLORURINE YELLOW 07/10/2020 1022   APPEARANCEUR CLEAR 07/10/2020 1022   LABSPEC 1.017 07/10/2020 1022   PHURINE 6.0 07/10/2020 1022   GLUCOSEU NEGATIVE 07/10/2020 1022   HGBUR NEGATIVE 07/10/2020 1022   HGBUR negative 07/21/2009 1304   BILIRUBINUR negative 05/18/2021 1344   BILIRUBINUR neg 10/13/2015 1330   KETONESUR negative 05/18/2021 1344   KETONESUR NEGATIVE 07/10/2020 1022   PROTEINUR 100 (A) 07/10/2020 1022   UROBILINOGEN 0.2 05/18/2021 1344   UROBILINOGEN 0.2 09/09/2013 2330   NITRITE Negative 05/18/2021 1344   NITRITE NEGATIVE 07/10/2020 1022   LEUKOCYTESUR Small (1+) (A) 05/18/2021 1344   LEUKOCYTESUR NEGATIVE 07/10/2020 1022    ----------------------------------------------------------------------------------------------------------------   Imaging Results:    DG Chest Port 1 View  Result Date: 04/06/2022 CLINICAL DATA:  Provided history: Questionable sepsis-evaluate for abnormality. EXAM: PORTABLE CHEST 1 VIEW COMPARISON:  Chest CT 03/23/2022. Prior chest radiographs 11/09/2021 and earlier. FINDINGS: Heart size within normal limits. Aortic atherosclerosis. Prominence of the interstitial lung markings, new from the prior chest radiographs of 11/09/2021. No appreciable airspace consolidation. No evidence of pleural effusion or pneumothorax. No acute osseous abnormality identified. Degenerative changes of the  spine. IMPRESSION: Prominence of the interstitial lung markings, new from the prior chest radiographs of 11/09/2021. This may reflect interstitial edema or sequela of atypical/viral pneumonia. Aortic Atherosclerosis (ICD10-I70.0). Electronically Signed   By: Kellie Simmering D.O.   On: 04/06/2022 13:52    Radiological Exams on Admission: DG Chest Port 1 View  Result Date: 04/06/2022 CLINICAL DATA:  Provided history: Questionable sepsis-evaluate for abnormality. EXAM: PORTABLE CHEST 1 VIEW COMPARISON:  Chest CT 03/23/2022. Prior chest radiographs 11/09/2021 and earlier. FINDINGS: Heart size within normal limits. Aortic atherosclerosis. Prominence of the interstitial lung markings, new from the prior chest radiographs of 11/09/2021. No appreciable airspace consolidation. No evidence of pleural effusion or pneumothorax. No acute osseous abnormality identified. Degenerative changes of the spine. IMPRESSION: Prominence of the interstitial lung markings, new from the prior chest radiographs of 11/09/2021. This may reflect interstitial edema or sequela of atypical/viral pneumonia. Aortic Atherosclerosis (ICD10-I70.0). Electronically Signed   By: Kellie Simmering D.O.   On: 04/06/2022 13:52    DVT Prophylaxis -SCD /Lovenox AM Labs Ordered, also please review Full Orders  Family Communication: Admission, patients condition and plan of care including tests being ordered have been discussed with the patient who indicate understanding and agree with the plan   Condition  -stable  Roxan Hockey M.D on 04/06/2022 at 7:36 PM Go to [email or web address] -  for contact info  [facility] - Office  [phone]

## 2022-04-06 NOTE — ED Notes (Signed)
Pt transferred to hospital bed

## 2022-04-07 ENCOUNTER — Inpatient Hospital Stay (HOSPITAL_COMMUNITY): Payer: 59

## 2022-04-07 DIAGNOSIS — F17210 Nicotine dependence, cigarettes, uncomplicated: Secondary | ICD-10-CM | POA: Diagnosis present

## 2022-04-07 DIAGNOSIS — E1165 Type 2 diabetes mellitus with hyperglycemia: Secondary | ICD-10-CM | POA: Diagnosis not present

## 2022-04-07 DIAGNOSIS — Z7901 Long term (current) use of anticoagulants: Secondary | ICD-10-CM | POA: Diagnosis not present

## 2022-04-07 DIAGNOSIS — G9341 Metabolic encephalopathy: Secondary | ICD-10-CM | POA: Diagnosis not present

## 2022-04-07 DIAGNOSIS — I5021 Acute systolic (congestive) heart failure: Secondary | ICD-10-CM | POA: Diagnosis present

## 2022-04-07 DIAGNOSIS — M1990 Unspecified osteoarthritis, unspecified site: Secondary | ICD-10-CM | POA: Diagnosis present

## 2022-04-07 DIAGNOSIS — E785 Hyperlipidemia, unspecified: Secondary | ICD-10-CM | POA: Diagnosis present

## 2022-04-07 DIAGNOSIS — I5020 Unspecified systolic (congestive) heart failure: Secondary | ICD-10-CM | POA: Diagnosis not present

## 2022-04-07 DIAGNOSIS — Z7189 Other specified counseling: Secondary | ICD-10-CM | POA: Diagnosis not present

## 2022-04-07 DIAGNOSIS — E7849 Other hyperlipidemia: Secondary | ICD-10-CM | POA: Diagnosis not present

## 2022-04-07 DIAGNOSIS — T380X5A Adverse effect of glucocorticoids and synthetic analogues, initial encounter: Secondary | ICD-10-CM | POA: Diagnosis not present

## 2022-04-07 DIAGNOSIS — J441 Chronic obstructive pulmonary disease with (acute) exacerbation: Secondary | ICD-10-CM | POA: Diagnosis not present

## 2022-04-07 DIAGNOSIS — K5730 Diverticulosis of large intestine without perforation or abscess without bleeding: Secondary | ICD-10-CM | POA: Diagnosis present

## 2022-04-07 DIAGNOSIS — R0600 Dyspnea, unspecified: Secondary | ICD-10-CM | POA: Diagnosis not present

## 2022-04-07 DIAGNOSIS — J811 Chronic pulmonary edema: Secondary | ICD-10-CM | POA: Diagnosis not present

## 2022-04-07 DIAGNOSIS — I4891 Unspecified atrial fibrillation: Secondary | ICD-10-CM | POA: Diagnosis not present

## 2022-04-07 DIAGNOSIS — M5410 Radiculopathy, site unspecified: Secondary | ICD-10-CM | POA: Diagnosis not present

## 2022-04-07 DIAGNOSIS — J4489 Other specified chronic obstructive pulmonary disease: Secondary | ICD-10-CM | POA: Diagnosis not present

## 2022-04-07 DIAGNOSIS — J449 Chronic obstructive pulmonary disease, unspecified: Secondary | ICD-10-CM | POA: Diagnosis not present

## 2022-04-07 DIAGNOSIS — Z1152 Encounter for screening for COVID-19: Secondary | ICD-10-CM | POA: Diagnosis not present

## 2022-04-07 DIAGNOSIS — E8729 Other acidosis: Secondary | ICD-10-CM | POA: Diagnosis not present

## 2022-04-07 DIAGNOSIS — J9601 Acute respiratory failure with hypoxia: Secondary | ICD-10-CM | POA: Diagnosis not present

## 2022-04-07 DIAGNOSIS — I255 Ischemic cardiomyopathy: Secondary | ICD-10-CM | POA: Diagnosis not present

## 2022-04-07 DIAGNOSIS — I2510 Atherosclerotic heart disease of native coronary artery without angina pectoris: Secondary | ICD-10-CM | POA: Diagnosis present

## 2022-04-07 DIAGNOSIS — J129 Viral pneumonia, unspecified: Secondary | ICD-10-CM | POA: Diagnosis present

## 2022-04-07 DIAGNOSIS — I358 Other nonrheumatic aortic valve disorders: Secondary | ICD-10-CM | POA: Diagnosis present

## 2022-04-07 DIAGNOSIS — R0602 Shortness of breath: Secondary | ICD-10-CM | POA: Diagnosis not present

## 2022-04-07 DIAGNOSIS — R9431 Abnormal electrocardiogram [ECG] [EKG]: Secondary | ICD-10-CM | POA: Diagnosis not present

## 2022-04-07 DIAGNOSIS — J90 Pleural effusion, not elsewhere classified: Secondary | ICD-10-CM | POA: Diagnosis not present

## 2022-04-07 DIAGNOSIS — J440 Chronic obstructive pulmonary disease with acute lower respiratory infection: Secondary | ICD-10-CM | POA: Diagnosis present

## 2022-04-07 DIAGNOSIS — Z515 Encounter for palliative care: Secondary | ICD-10-CM | POA: Diagnosis not present

## 2022-04-07 DIAGNOSIS — I480 Paroxysmal atrial fibrillation: Secondary | ICD-10-CM | POA: Diagnosis not present

## 2022-04-07 DIAGNOSIS — A4189 Other specified sepsis: Secondary | ICD-10-CM | POA: Diagnosis present

## 2022-04-07 DIAGNOSIS — J9811 Atelectasis: Secondary | ICD-10-CM | POA: Diagnosis not present

## 2022-04-07 DIAGNOSIS — I700 Atherosclerosis of aorta: Secondary | ICD-10-CM | POA: Diagnosis present

## 2022-04-07 DIAGNOSIS — J9602 Acute respiratory failure with hypercapnia: Secondary | ICD-10-CM | POA: Diagnosis not present

## 2022-04-07 DIAGNOSIS — I110 Hypertensive heart disease with heart failure: Secondary | ICD-10-CM | POA: Diagnosis present

## 2022-04-07 DIAGNOSIS — I4892 Unspecified atrial flutter: Secondary | ICD-10-CM | POA: Diagnosis not present

## 2022-04-07 DIAGNOSIS — E118 Type 2 diabetes mellitus with unspecified complications: Secondary | ICD-10-CM | POA: Diagnosis not present

## 2022-04-07 LAB — BASIC METABOLIC PANEL
Anion gap: 9 (ref 5–15)
BUN: 21 mg/dL (ref 8–23)
CO2: 24 mmol/L (ref 22–32)
Calcium: 9.2 mg/dL (ref 8.9–10.3)
Chloride: 104 mmol/L (ref 98–111)
Creatinine, Ser: 0.66 mg/dL (ref 0.44–1.00)
GFR, Estimated: >60 mL/min (ref >60)
Glucose, Bld: 145 mg/dL — ABNORMAL HIGH (ref 70–99)
Potassium: 4 mmol/L (ref 3.5–5.1)
Sodium: 137 mmol/L (ref 135–145)

## 2022-04-07 LAB — ECHOCARDIOGRAM COMPLETE
Area-P 1/2: 7.16 cm2
Calc EF: 43.6 %
Height: 65 in
S' Lateral: 4.2 cm
Single Plane A2C EF: 48.4 %
Single Plane A4C EF: 43.7 %
Weight: 2599.66 oz

## 2022-04-07 MED ORDER — AMIODARONE HCL IN DEXTROSE 360-4.14 MG/200ML-% IV SOLN
30.0000 mg/h | INTRAVENOUS | Status: DC
  Administered 2022-04-08 – 2022-04-11 (×6): 30 mg/h via INTRAVENOUS
  Filled 2022-04-07 (×7): qty 200

## 2022-04-07 MED ORDER — CHLORHEXIDINE GLUCONATE CLOTH 2 % EX PADS
6.0000 | MEDICATED_PAD | Freq: Every day | CUTANEOUS | Status: DC
  Administered 2022-04-07 – 2022-04-16 (×9): 6 via TOPICAL

## 2022-04-07 MED ORDER — ALBUTEROL SULFATE HFA 108 (90 BASE) MCG/ACT IN AERS
2.0000 | INHALATION_SPRAY | RESPIRATORY_TRACT | Status: DC | PRN
  Administered 2022-04-07: 2 via RESPIRATORY_TRACT
  Filled 2022-04-07 (×2): qty 6.7

## 2022-04-07 MED ORDER — AMIODARONE LOAD VIA INFUSION
150.0000 mg | Freq: Once | INTRAVENOUS | Status: AC
  Administered 2022-04-07: 150 mg via INTRAVENOUS
  Filled 2022-04-07: qty 83.34

## 2022-04-07 MED ORDER — AMIODARONE HCL IN DEXTROSE 360-4.14 MG/200ML-% IV SOLN
60.0000 mg/h | INTRAVENOUS | Status: AC
  Administered 2022-04-07 (×2): 60 mg/h via INTRAVENOUS
  Filled 2022-04-07: qty 200

## 2022-04-07 MED ORDER — METOPROLOL TARTRATE 25 MG PO TABS
12.5000 mg | ORAL_TABLET | Freq: Two times a day (BID) | ORAL | Status: DC
  Administered 2022-04-07 – 2022-04-08 (×3): 12.5 mg via ORAL
  Filled 2022-04-07 (×3): qty 1

## 2022-04-07 MED ORDER — PERFLUTREN LIPID MICROSPHERE
1.0000 mL | INTRAVENOUS | Status: AC | PRN
  Administered 2022-04-07: 2 mL via INTRAVENOUS

## 2022-04-07 NOTE — Progress Notes (Signed)
   Echo with EF of 35 to 40% with regional wall motion abnormalities--The inferior wall, mid anteroseptal segment, mid inferoseptal segment, and basal inferoseptal segment are hypokinetic  - -Patient is currently on IV Cardizem for A-fib with RVR -Stop Cardizem and transition to amiodarone and metoprolol -??  Tachycardia induced cardiomyopathy with low EF Versus ischemic cardiomyopathy -Start IV heparin -Consider LHC -Get cardiology consult  Roxan Hockey, MD

## 2022-04-07 NOTE — Progress Notes (Signed)
Patient was complaining of being very anxious earlier. Had an ativan order for 0.5 mg which is what I gave. Patient after about thirty minutes became very agitated and seeing things and wanted to get out of bed to go to her room "she said". Told her she was in her room and she became mad that she was not supposed to be. Trying to redirect and talk to her to calm her down.

## 2022-04-07 NOTE — Progress Notes (Signed)
PROGRESS NOTE     [name], is a 77 y.o. female, DOB - 08/07/1945, [id number]  Admit date - 04/06/2022   Admitting Physician Tanya Sweatt Denton Brick, MD  Outpatient Primary MD for the patient is Tanya Helper, MD  LOS - 0  Chief Complaint  Patient presents with   Shortness of Breath        Brief Narrative:  77 y.o. female with past medical history relevant for COPD/asthma in the setting of ongoing tobacco use, as well as history of left breast cancer, DM2 and HTN admitted on 04/06/2022 with acute hypoxic respiratory failure in the setting of acute COPD exacerbation and new onset A-fib with RVR  -Assessment and Plan: 1) acute COPD exacerbation--- secondary to respiratory infection - 04/07/22 -Patient remains quite symptomatic with dyspnea , productive cough and shortness of breath and hypoxia -c/n iv  steroids bronchodilators mucolytic's and Rocephin/doxycycline   2)Presumed CAP---POA - patient presented with productive cough with colored sputum, low-grade fevers and leukocytosis -- On admission chest x-ray suggestive of possible atypical/viral pneumonia,  -On admission procalcitonin 0.26 -On admission WBC is 19.6 patient has a history of persistent leukocytosis, but this is higher than baseline, lactic acid is not elevated -Antibiotics, mucolytics and bronchodilators as above #1   3)New onset A-fib with RVR---While in the ED at the time of admission patient was found to be in A-fib with RVR which is new for her -Initial troponin is 50, repeat troponin is 50 -TSH WNL -Initial EKG with sinus tach, while in the ED patient was noted to be very tachycardic on the monitor with concerns for A-fib repeat EKG suggested A-fib with RVR 04/07/22 -remains in A-fib with RVR rate control improving with IV Cardizem drip -Echo pending-----If EF is low on echo  consider switching to metoprolol from Cardizem --Defer anticoagulation at this time   4) acute hypoxic respiratory failure---  due to #1, #2 #3 above --in the ED on admission patient is found to be hypoxic with O2 sats of 84% on room air and 90% on 2 L with productive  04/07/22 -Desats while trying to wean off oxygen, -Currently requiring 2 L of oxygen via nasal cannula   5)HTN--holding PTA amlodipine as patient needs Cardizem for rate control -If EF is low on echo  consider switching to metoprolol from Cardizem   6)DM2-recent A1c 6.3% reflecting excellent diabetic control PTA -Anticipate worsening glycemic control while on steroids for #1 above Use Novolog/Humalog Sliding scale insulin with Accu-Cheks/Fingersticks as ordered    Status is: Inpatient   Disposition: The patient is from: Home              Anticipated d/c is to: Home              Anticipated d/c date is: 2 days              Patient currently is not medically stable to d/c. Barriers: Not Clinically Stable-   Code Status :  -  Code Status: Full Code   Family Communication:    NA (patient is alert, awake and coherent)   DVT Prophylaxis  :   - SCDs enoxaparin (LOVENOX) injection 40 mg Start: 04/06/22 1800 SCDs Start: 04/06/22 1655 Place TED hose Start: 04/06/22 1655   Lab Results  Component Value Date   PLT 391 04/06/2022    Inpatient Medications  Scheduled Meds:  aspirin EC  81 mg Oral Q breakfast   calcium-vitamin D  1 tablet Oral BID   cyanocobalamin  500 mcg Oral Daily   cyclobenzaprine  10 mg Oral QHS   dextromethorphan-guaiFENesin  1 tablet Oral BID   enoxaparin (LOVENOX) injection  40 mg Subcutaneous Q24H   methylPREDNISolone (SOLU-MEDROL) injection  40 mg Intravenous Q12H   mometasone-formoterol  2 puff Inhalation BID   polyethylene glycol powder  17 g Oral Daily   rosuvastatin  10 mg Oral Daily   sodium chloride flush  3 mL Intravenous Q12H   sodium chloride flush  3 mL Intravenous Q12H   spironolactone  50 mg Oral Daily   Continuous Infusions:  sodium chloride     cefTRIAXone (ROCEPHIN)  IV Stopped (04/06/22 1451)    diltiazem (CARDIZEM) infusion 5 mg/hr (04/07/22 0829)   doxycycline (VIBRAMYCIN) IV     PRN Meds:.sodium chloride, acetaminophen **OR** acetaminophen, albuterol, bisacodyl, levalbuterol, LORazepam, ondansetron **OR** ondansetron (ZOFRAN) IV, pantoprazole, polyethylene glycol, sodium chloride flush, traMADol, traZODone   Anti-infectives (From admission, onward)    Start     Dose/Rate Route Frequency Ordered Stop   04/07/22 1400  cefTRIAXone (ROCEPHIN) 2 g in sodium chloride 0.9 % 100 mL IVPB        2 g 200 mL/hr over 30 Minutes Intravenous Every 24 hours 04/06/22 1421     04/07/22 1400  azithromycin (ZITHROMAX) 500 mg in sodium chloride 0.9 % 250 mL IVPB  Status:  Discontinued        500 mg 250 mL/hr over 60 Minutes Intravenous Every 24 hours 04/06/22 1421 04/06/22 1516   04/07/22 1400  doxycycline (VIBRAMYCIN) 100 mg in sodium chloride 0.9 % 250 mL IVPB        100 mg 125 mL/hr over 120 Minutes Intravenous Every 12 hours 04/06/22 1514     04/06/22 1330  levofloxacin (LEVAQUIN) IVPB 750 mg        750 mg 100 mL/hr over 90 Minutes Intravenous  Once 04/06/22 1328 04/06/22 1438         Subjective: [name] today has no fevers, no emesis,  No chest pain,   -Productive cough, dyspnea and hypoxia persist -Desaturates with attempt to wean off O2 -  Objective: Vitals:   04/07/22 0638 04/07/22 0700 04/07/22 0730 04/07/22 0800  BP:  (!) 137/119 115/67 120/66  Pulse:    92  Resp:  (!) 25 (!) 28 (!) 26  Temp: 98.2 F (36.8 C)     TempSrc: Oral     SpO2:    96%  Weight:      Height:        Intake/Output Summary (Last 24 hours) at 04/07/2022 0938 Last data filed at 04/07/2022 0825 Gross per 24 hour  Intake 1960.1 ml  Output 500 ml  Net 1460.1 ml   Filed Weights   04/06/22 1302  Weight: 72.6 kg    Physical Exam  Gen:- Awake Alert, no acute distress, no significant conversational dyspnea at this time HEENT:- [place].AT, No sclera icterus Nose- [place] 2L/min Neck-Supple Neck,No  JVD,.  Lungs-  CTAB , fair symmetrical air movement CV- S1, S2 normal, irregularly irregular  abd-  +ve B.Sounds, Abd Soft, No tenderness,    Extremity/Skin:- No  edema, pedal pulses present  Psych-affect is appropriate, oriented x3 Neuro-no new focal deficits, no tremors  Data Reviewed: I have personally reviewed following labs and imaging studies  CBC: Recent Labs  Lab 04/06/22 1300  WBC 19.6*  NEUTROABS 14.9*  HGB 14.5  HCT 45.3  MCV 89.2  PLT 0000000   Basic Metabolic Panel: Recent Labs  Lab 04/06/22  1300 04/07/22 0556  NA 134* 137  K 3.8 4.0  CL 98 104  CO2 26 24  GLUCOSE 138* 145*  BUN 32* 21  CREATININE 0.96 0.66  CALCIUM 9.3 9.2   GFR: Estimated Creatinine Clearance: 58.8 mL/min (by C-G formula based on SCr of 0.66 mg/dL). Liver Function Tests: Recent Labs  Lab 04/06/22 1300  AST 23  ALT 18  ALKPHOS 64  BILITOT 0.8  PROT 8.0  ALBUMIN 3.6    Recent Results (from the past 240 hour(s))  Blood Culture (routine x 2)     Status: None (Preliminary result)   Collection Time: 04/06/22  1:50 PM   Specimen: BLOOD  Result Value Ref Range Status   Specimen Description BLOOD RIGHT ANTECUBITAL  Final   Special Requests   Final    BOTTLES DRAWN AEROBIC AND ANAEROBIC Blood Culture adequate volume   Culture   Final    NO GROWTH < 24 HOURS Performed at [facility], [address] [place], [place] [address]    Report Status PENDING  Incomplete  Blood Culture (routine x 2)     Status: None (Preliminary result)   Collection Time: 04/06/22  1:50 PM   Specimen: BLOOD  Result Value Ref Range Status   Specimen Description BLOOD LEFT ANTECUBITAL  Final   Special Requests   Final    BOTTLES DRAWN AEROBIC AND ANAEROBIC Blood Culture adequate volume   Culture   Final    NO GROWTH < 24 HOURS Performed at [facility], [address], [place], [place] [address]    Report Status PENDING  Incomplete  Resp panel by RT-PCR (RSV, Flu A&B, Covid) Anterior Nasal Swab      Status: None   Collection Time: 04/06/22  2:09 PM   Specimen: Anterior Nasal Swab  Result Value Ref Range Status   SARS Coronavirus 2 by RT PCR NEGATIVE NEGATIVE Final    Comment: (NOTE) SARS-CoV-2 target nucleic acids are NOT DETECTED.  The SARS-CoV-2 RNA is generally detectable in upper respiratory specimens during the acute phase of infection. The lowest concentration of SARS-CoV-2 viral copies this assay can detect is 138 copies/mL. A negative result does not preclude SARS-Cov-2 infection and should not be used as the sole basis for treatment or other patient management decisions. A negative result may occur with  improper specimen collection/handling, submission of specimen other than nasopharyngeal swab, presence of viral mutation(s) within the areas targeted by this assay, and inadequate number of viral copies(<138 copies/mL). A negative result must be combined with clinical observations, patient history, and epidemiological information. The expected result is Negative.  Fact Sheet for Patients:  EntrepreneurPulse.com.au  Fact Sheet for Healthcare Providers:  IncredibleEmployment.be  This test is no t yet approved or cleared by the [place] FDA and  has been authorized for detection and/or diagnosis of SARS-CoV-2 by FDA under an Emergency Use Authorization (EUA). This EUA will remain  in effect (meaning this test can be used) for the duration of the COVID-19 declaration under Section 564(b)(1) of the Act, 21 U.S.C.section 360bbb-3(b)(1), unless the authorization is terminated  or revoked sooner.       Influenza A by PCR NEGATIVE NEGATIVE Final   Influenza B by PCR NEGATIVE NEGATIVE Final    Comment: (NOTE) The Xpert Xpress SARS-CoV-2/FLU/RSV plus assay is intended as an aid in the diagnosis of influenza from Nasopharyngeal swab specimens and should not be used as a sole basis for treatment. Nasal washings and aspirates are  unacceptable for Xpert  Xpress SARS-CoV-2/FLU/RSV testing.  Fact Sheet for Patients: EntrepreneurPulse.com.au  Fact Sheet for Healthcare Providers: IncredibleEmployment.be  This test is not yet approved or cleared by the [place] FDA and has been authorized for detection and/or diagnosis of SARS-CoV-2 by FDA under an Emergency Use Authorization (EUA). This EUA will remain in effect (meaning this test can be used) for the duration of the COVID-19 declaration under Section 564(b)(1) of the Act, 21 U.S.C. section 360bbb-3(b)(1), unless the authorization is terminated or revoked.     Resp Syncytial Virus by PCR NEGATIVE NEGATIVE Final    Comment: (NOTE) Fact Sheet for Patients: EntrepreneurPulse.com.au  Fact Sheet for Healthcare Providers: IncredibleEmployment.be  This test is not yet approved or cleared by the [place] FDA and has been authorized for detection and/or diagnosis of SARS-CoV-2 by FDA under an Emergency Use Authorization (EUA). This EUA will remain in effect (meaning this test can be used) for the duration of the COVID-19 declaration under Section 564(b)(1) of the Act, 21 U.S.C. section 360bbb-3(b)(1), unless the authorization is terminated or revoked.  Performed at [facility], [address], [place], [place] [address]   Expectorated Sputum Assessment w Gram Stain, Rflx to Resp Cult     Status: None   Collection Time: 04/06/22  9:19 PM   Specimen: Sputum  Result Value Ref Range Status   Specimen Description SPUTUM  Final   Special Requests NONE  Final   Sputum evaluation   Final    THIS SPECIMEN IS ACCEPTABLE FOR SPUTUM CULTURE PERORMED AT [facility] Performed at [facility], [address], [place], [place] [address]    Report Status 04/06/2022 FINAL  Final      Radiology Studies: [facility] Chest Port 1 View  Result Date: 04/06/2022 CLINICAL DATA:  Provided history: Questionable  sepsis-evaluate for abnormality. EXAM: PORTABLE CHEST 1 VIEW COMPARISON:  Chest CT 03/23/2022. Prior chest radiographs 11/09/2021 and earlier. FINDINGS: Heart size within normal limits. Aortic atherosclerosis. Prominence of the interstitial lung markings, new from the prior chest radiographs of 11/09/2021. No appreciable airspace consolidation. No evidence of pleural effusion or pneumothorax. No acute osseous abnormality identified. Degenerative changes of the spine. IMPRESSION: Prominence of the interstitial lung markings, new from the prior chest radiographs of 11/09/2021. This may reflect interstitial edema or sequela of atypical/viral pneumonia. Aortic Atherosclerosis (ICD10-I70.0). Electronically Signed   By: Kellie Simmering D.O.   On: 04/06/2022 13:52     Scheduled Meds:  aspirin EC  81 mg Oral Q breakfast   calcium-vitamin D  1 tablet Oral BID   cyanocobalamin  500 mcg Oral Daily   cyclobenzaprine  10 mg Oral QHS   dextromethorphan-guaiFENesin  1 tablet Oral BID   enoxaparin (LOVENOX) injection  40 mg Subcutaneous Q24H   methylPREDNISolone (SOLU-MEDROL) injection  40 mg Intravenous Q12H   mometasone-formoterol  2 puff Inhalation BID   polyethylene glycol powder  17 g Oral Daily   rosuvastatin  10 mg Oral Daily   sodium chloride flush  3 mL Intravenous Q12H   sodium chloride flush  3 mL Intravenous Q12H   spironolactone  50 mg Oral Daily   Continuous Infusions:  sodium chloride     cefTRIAXone (ROCEPHIN)  IV Stopped (04/06/22 1451)   diltiazem (CARDIZEM) infusion 5 mg/hr (04/07/22 0829)   doxycycline (VIBRAMYCIN) IV       LOS: 0 days    Roxan Hockey M.D on 04/07/2022 at 9:38 AM  Go to [email or web address] - for contact info  [facility] - Office  [phone]  If 7PM-7AM, please contact night-coverage [email or web address] 04/07/2022, 9:38 AM

## 2022-04-08 ENCOUNTER — Inpatient Hospital Stay (HOSPITAL_COMMUNITY): Payer: 59

## 2022-04-08 DIAGNOSIS — J9601 Acute respiratory failure with hypoxia: Secondary | ICD-10-CM | POA: Diagnosis not present

## 2022-04-08 LAB — BLOOD GAS, ARTERIAL
Acid-base deficit: 0.6 mmol/L (ref 0.0–2.0)
Bicarbonate: 30.3 mmol/L — ABNORMAL HIGH (ref 20.0–28.0)
Drawn by: 22179
O2 Saturation: 99.2 %
Patient temperature: 36.6
pCO2 arterial: 82 mmHg (ref 32–48)
pH, Arterial: 7.18 — CL (ref 7.35–7.45)
pO2, Arterial: 102 mmHg (ref 83–108)

## 2022-04-08 LAB — CBC
HCT: 43.9 % (ref 36.0–46.0)
Hemoglobin: 13.2 g/dL (ref 12.0–15.0)
MCH: 28.1 pg (ref 26.0–34.0)
MCHC: 30.1 g/dL (ref 30.0–36.0)
MCV: 93.6 fL (ref 80.0–100.0)
Platelets: 346 10*3/uL (ref 150–400)
RBC: 4.69 MIL/uL (ref 3.87–5.11)
RDW: 14.8 % (ref 11.5–15.5)
WBC: 16.7 10*3/uL — ABNORMAL HIGH (ref 4.0–10.5)
nRBC: 0 % (ref 0.0–0.2)

## 2022-04-08 LAB — MRSA NEXT GEN BY PCR, NASAL: MRSA by PCR Next Gen: NOT DETECTED

## 2022-04-08 LAB — GLUCOSE, CAPILLARY
Glucose-Capillary: 114 mg/dL — ABNORMAL HIGH (ref 70–99)
Glucose-Capillary: 139 mg/dL — ABNORMAL HIGH (ref 70–99)

## 2022-04-08 LAB — TROPONIN I (HIGH SENSITIVITY): Troponin I (High Sensitivity): 17 ng/L (ref <18)

## 2022-04-08 LAB — URINE CULTURE: Culture: NO GROWTH

## 2022-04-08 LAB — HEPARIN LEVEL (UNFRACTIONATED): Heparin Unfractionated: 0.5 IU/mL (ref 0.30–0.70)

## 2022-04-08 MED ORDER — HEPARIN (PORCINE) 25000 UT/250ML-% IV SOLN
1100.0000 [IU]/h | INTRAVENOUS | Status: DC
  Administered 2022-04-08: 1000 [IU]/h via INTRAVENOUS
  Administered 2022-04-09 – 2022-04-11 (×3): 1100 [IU]/h via INTRAVENOUS
  Filled 2022-04-08 (×4): qty 250

## 2022-04-08 MED ORDER — IPRATROPIUM BROMIDE 0.02 % IN SOLN
RESPIRATORY_TRACT | Status: AC
  Administered 2022-04-08: 0.5 mg
  Filled 2022-04-08: qty 2.5

## 2022-04-08 MED ORDER — AMLODIPINE BESYLATE 5 MG PO TABS: 5.0000 mg | ORAL_TABLET | Freq: Every day | ORAL | Status: DC

## 2022-04-08 MED ORDER — HEPARIN BOLUS VIA INFUSION
3500.0000 [IU] | Freq: Once | INTRAVENOUS | Status: AC
  Administered 2022-04-08: 3500 [IU] via INTRAVENOUS
  Filled 2022-04-08: qty 3500

## 2022-04-08 MED ORDER — SODIUM CHLORIDE 0.9 % IV SOLN: INTRAVENOUS | Status: DC

## 2022-04-08 MED ORDER — ALBUTEROL SULFATE (2.5 MG/3ML) 0.083% IN NEBU
2.5000 mg | INHALATION_SOLUTION | RESPIRATORY_TRACT | Status: DC
  Administered 2022-04-08 (×3): 2.5 mg via RESPIRATORY_TRACT
  Filled 2022-04-08 (×3): qty 3

## 2022-04-08 MED ORDER — ALBUTEROL SULFATE (2.5 MG/3ML) 0.083% IN NEBU
INHALATION_SOLUTION | RESPIRATORY_TRACT | Status: AC
  Filled 2022-04-08: qty 3

## 2022-04-08 MED ORDER — LABETALOL HCL 5 MG/ML IV SOLN
10.0000 mg | INTRAVENOUS | Status: DC | PRN
  Administered 2022-04-08 – 2022-04-10 (×3): 10 mg via INTRAVENOUS
  Filled 2022-04-08 (×2): qty 4

## 2022-04-08 NOTE — Progress Notes (Addendum)
PROGRESS NOTE     [name], is a 77 y.o. female, DOB - 02/24/1945, [id number]  Admit date - 04/06/2022   Admitting Physician Amarilys Lyles Denton Brick, MD  Outpatient Primary MD for the patient is [name], MD  LOS - 1  Chief Complaint  Patient presents with   Shortness of Breath        Brief Narrative:  77 y.o. female with past medical history relevant for COPD/asthma in the setting of ongoing tobacco use, as well as history of left breast cancer, diabetes type 2 and hypertension admitted on 04/07/2022 with acute hypoxic and hypercapnic respiratory failure in the setting of community-acquired pneumonia/fibrillation ablation as well as new onset atrial fibrillation and low EF    -Assessment and Plan:   1)Acute COPD exacerbation/tobacco abuse --- secondary to respiratory infection - -continue IV steroids bronchodilators, mucolytic's and Rocephin/doxycycline as ordered   2)Presumed CAP--- patient presented with productive cough with colored sputum, low-grade fevers and leukocytosis --Chest x-ray suggestive of possible atypical/viral pneumonia,  -Procalcitonin 0.26 -COVID, RSV and influenza negative -Respiratory culture NGTD -WBC is 19.6 patient has a history of persistent leukocytosis, but this is higher than baseline, lactic acid is not elevated -Antibiotics and bronchodilators as above #1   3)New onset A-fib with RVR---While in the ED patient is found to be in A-fib with RVR which is new for her -Troponin 50 >> 50 >> 17 -TSH WNL -Initial EKG with sinus tach, while in the ED patient was noted to be very tachycardic on the monitor with concerns for A-fib repeat EKG suggested A-fib with RVR -Echo with EF of 35 to 40% with regional wall motion abnormalities--The inferior wall, mid anteroseptal segment, mid inferoseptal segment, and basal inferoseptal segment are hypokinetic  --Stopped Iv Cardizem and transitioned to amiodarone and metoprolol--due to low EF -??   Tachycardia induced cardiomyopathy with low EF Versus ischemic cardiomyopathy -Start IV heparin -Consider LHC once respiratory status more stable -Get cardiology consult   4) acute hypoxic and hypercapnic respiratory failure--- due to #1, #2 #3 above --in the ED patient is found to be hypoxic with O2 sats of 84% on room air and 90% on 2 L with productive  04/08/22 -Hypoxia persisted -Patient developed significant lethargy/metabolic encephalopathy/unresponsiveness-able--G consistent with uncompensated respiratory acidosis due to hypercapnia with a pH of 7.18 and pCO2 of 82 -Initiated on BiPAP on 04/08/2022   5)HFrEF-----acute systolic dysfunction CHF- --Troponin 50 >> 50 >> 17 Echo with EF of 35 to 40% with regional wall motion abnormalities--The inferior wall, mid anteroseptal segment, mid inferoseptal segment, and basal inferoseptal segment are hypokinetic  --??  Tachycardia induced cardiomyopathy with low EF Versus ischemic cardiomyopathy -Give metoprolol -Consider LHC after respiratory status improves -Get cardiology consult  6)HTN--restart Amlodipine and okay to continue Metoprolol -IV labetalol as needed persistently elevated BP  7)DM2-recent A1c 6.3% reflecting excellent diabetic control PTA -Anticipate worsening glycemic control while on steroids for #1 above Use Novolog/Humalog Sliding scale insulin with Accu-Cheks/Fingersticks as ordered   8)Social/Ethics--- discussed with patient's granddaughter [name] who is patient's primary caregiver -Patient is a full code without limitations to treatment  Status is: Inpatient   Disposition: The patient is from: Home              Anticipated d/c is to: Home              Anticipated d/c date is: > 3 days              Patient currently  is not medically stable to d/c. Barriers: Not Clinically Stable-   Code Status :  -  Code Status: Full Code   Family Communication:     discussed with patient's granddaughter [name] who is patient's primary caregiver  DVT Prophylaxis  :   - SCDs/Iv Heparin  SCDs Start: 04/06/22 1655 Place TED hose Start: 04/06/22 1655   Lab Results  Component Value Date   PLT 391 04/06/2022    Inpatient Medications  Scheduled Meds:  albuterol  2.5 mg Nebulization Q4H   aspirin EC  81 mg Oral Q breakfast   calcium-vitamin D  1 tablet Oral BID   Chlorhexidine Gluconate Cloth  6 each Topical Daily   cyanocobalamin  500 mcg Oral Daily   cyclobenzaprine  10 mg Oral QHS   dextromethorphan-guaiFENesin  1 tablet Oral BID   enoxaparin (LOVENOX) injection  40 mg Subcutaneous Q24H   methylPREDNISolone (SOLU-MEDROL) injection  40 mg Intravenous Q12H   metoprolol tartrate  12.5 mg Oral BID   mometasone-formoterol  2 puff Inhalation BID   polyethylene glycol powder  17 g Oral Daily   rosuvastatin  10 mg Oral Daily   sodium chloride flush  3 mL Intravenous Q12H   sodium chloride flush  3 mL Intravenous Q12H   spironolactone  50 mg Oral Daily   Continuous Infusions:  sodium chloride     amiodarone 30 mg/hr (04/08/22 1426)   cefTRIAXone (ROCEPHIN)  IV Stopped (04/08/22 1416)   doxycycline (VIBRAMYCIN) IV Stopped (04/08/22 0443)   PRN Meds:.sodium chloride, acetaminophen **OR** acetaminophen, albuterol, bisacodyl, labetalol, levalbuterol, ondansetron **OR** ondansetron (ZOFRAN) IV, pantoprazole, polyethylene glycol, sodium chloride flush, traMADol, traZODone   Anti-infectives (From admission, onward)    Start     Dose/Rate Route Frequency Ordered Stop   04/07/22 1400  cefTRIAXone (ROCEPHIN) 2 g in sodium chloride 0.9 % 100 mL IVPB        2 g 200 mL/hr over 30 Minutes Intravenous Every 24 hours 04/06/22 1421     04/07/22 1400  azithromycin (ZITHROMAX) 500 mg in sodium chloride 0.9 % 250 mL IVPB  Status:  Discontinued        500 mg 250 mL/hr over 60 Minutes Intravenous Every 24 hours 04/06/22 1421 04/06/22 1516   04/07/22 1400  doxycycline (VIBRAMYCIN) 100 mg in sodium  chloride 0.9 % 250 mL IVPB        100 mg 125 mL/hr over 120 Minutes Intravenous Every 12 hours 04/06/22 1514     04/06/22 1330  levofloxacin (LEVAQUIN) IVPB 750 mg        750 mg 100 mL/hr over 90 Minutes Intravenous  Once 04/06/22 1328 04/06/22 1438         Subjective: [name] today has no fevers, no emesis,  No chest pain,    Increased lethargy/confusion/encephalopathy--should improve with BiPAP   Objective: Vitals:   04/08/22 1205 04/08/22 1230 04/08/22 1243 04/08/22 1300  BP: 100/80  (!) 117/59 126/63  Pulse: 94  83 73  Resp: (!) 30  (!) 27 20  Temp:      TempSrc:      SpO2: 91% 91% 96% 94%  Weight:      Height:        Intake/Output Summary (Last 24 hours) at 04/08/2022 1428 Last data filed at 04/08/2022 1426 Gross per 24 hour  Intake 1520.63 ml  Output --  Net 1520.63 ml   Filed Weights   04/06/22 1302 04/07/22 1104 04/08/22 0500  Weight: 72.6 kg  73.7 kg 73.7 kg    Physical Exam  Gen:-Lethargic most likely due to high CO2 levels HEENT:- [place].AT, No sclera icterus Nose- Bipap Mask Neck-Supple Neck,No JVD,.  Lungs-diminished breath sounds with scattered wheezes CV- S1, S2 normal, regular  Abd-  +ve B.Sounds, Abd Soft, No tenderness,    Extremity/Skin:- No  edema, pedal pulses present  Psych-affect is flat, lethargic disoriented Neuro-generalized weakness, lethargy, no new focal deficits, no tremors  Data Reviewed: I have personally reviewed following labs and imaging studies  CBC: Recent Labs  Lab 04/06/22 1300  WBC 19.6*  NEUTROABS 14.9*  HGB 14.5  HCT 45.3  MCV 89.2  PLT 0000000   Basic Metabolic Panel: Recent Labs  Lab 04/06/22 1300 04/07/22 0556  NA 134* 137  K 3.8 4.0  CL 98 104  CO2 26 24  GLUCOSE 138* 145*  BUN 32* 21  CREATININE 0.96 0.66  CALCIUM 9.3 9.2   GFR: Estimated Creatinine Clearance: 59.2 mL/min (by C-G formula based on SCr of 0.66 mg/dL). Liver Function Tests: Recent Labs  Lab 04/06/22 1300  AST 23  ALT 18   ALKPHOS 64  BILITOT 0.8  PROT 8.0  ALBUMIN 3.6    Recent Results (from the past 240 hour(s))  Blood Culture (routine x 2)     Status: None (Preliminary result)   Collection Time: 04/06/22  1:50 PM   Specimen: BLOOD  Result Value [place] Status   Specimen Description BLOOD RIGHT ANTECUBITAL  Final   Special Requests   Final    BOTTLES DRAWN AEROBIC AND ANAEROBIC Blood Culture adequate volume   Culture   Final    NO GROWTH 2 DAYS Performed at [facility], [address], [place], [place] [address]    Report Status PENDING  Incomplete  Blood Culture (routine x 2)     Status: None (Preliminary result)   Collection Time: 04/06/22  1:50 PM   Specimen: BLOOD  Result Value Ref [place] Status   Specimen Description BLOOD LEFT ANTECUBITAL  Final   Special Requests   Final    BOTTLES DRAWN AEROBIC AND ANAEROBIC Blood Culture adequate volume   Culture   Final    NO GROWTH 2 DAYS Performed at [facility], [address] [place], [place] [address]    Report Status PENDING  Incomplete  Resp panel by RT-PCR (RSV, Flu A&B, Covid) Anterior Nasal Swab     Status: None   Collection Time: 04/06/22  2:09 PM   Specimen: Anterior Nasal Swab  Result Value Ref [place] Status   SARS Coronavirus 2 by RT PCR NEGATIVE NEGATIVE Final    Comment: (NOTE) SARS-CoV-2 target nucleic acids are NOT DETECTED.  The SARS-CoV-2 RNA is generally detectable in upper respiratory specimens during the acute phase of infection. The lowest concentration of SARS-CoV-2 viral copies this assay can detect is 138 copies/mL. A negative result does not preclude SARS-Cov-2 infection and should not be used as the sole basis for treatment or other patient management decisions. A negative result may occur with  improper specimen collection/handling, submission of specimen other than nasopharyngeal swab, presence of viral mutation(s) within the areas targeted by this assay, and inadequate number of viral copies(<138 copies/mL).  A negative result must be combined with clinical observations, patient history, and epidemiological information. The expected result is Negative.  Fact Sheet for Patients:  EntrepreneurPulse.com.au  Fact Sheet for Healthcare Providers:  IncredibleEmployment.be  This test is no t yet approved or cleared by the [place] and  has been authorized for detection and/or diagnosis of SARS-CoV-2 by FDA under an Emergency Use Authorization (EUA). This EUA will remain  in effect (meaning this test can be used) for the duration of the COVID-19 declaration under Section 564(b)(1) of the Act, 21 U.S.C.section 360bbb-3(b)(1), unless the authorization is terminated  or revoked sooner.       Influenza A by PCR NEGATIVE NEGATIVE Final   Influenza B by PCR NEGATIVE NEGATIVE Final    Comment: (NOTE) The Xpert Xpress SARS-CoV-2/FLU/RSV plus assay is intended as an aid in the diagnosis of influenza from Nasopharyngeal swab specimens and should not be used as a sole basis for treatment. Nasal washings and aspirates are unacceptable for Xpert Xpress SARS-CoV-2/FLU/RSV testing.  Fact Sheet for Patients: EntrepreneurPulse.com.au  Fact Sheet for Healthcare Providers: IncredibleEmployment.be  This test is not yet approved or cleared by the [place] FDA and has been authorized for detection and/or diagnosis of SARS-CoV-2 by FDA under an Emergency Use Authorization (EUA). This EUA will remain in effect (meaning this test can be used) for the duration of the COVID-19 declaration under Section 564(b)(1) of the Act, 21 U.S.C. section 360bbb-3(b)(1), unless the authorization is terminated or revoked.     Resp Syncytial Virus by PCR NEGATIVE NEGATIVE Final    Comment: (NOTE) Fact Sheet for Patients: EntrepreneurPulse.com.au  Fact Sheet for Healthcare  Providers: IncredibleEmployment.be  This test is not yet approved or cleared by the [place] FDA and has been authorized for detection and/or diagnosis of SARS-CoV-2 by FDA under an Emergency Use Authorization (EUA). This EUA will remain in effect (meaning this test can be used) for the duration of the COVID-19 declaration under Section 564(b)(1) of the Act, 21 U.S.C. section 360bbb-3(b)(1), unless the authorization is terminated or revoked.  Performed at [facility], [address], [place], [place] [address]   Urine Culture     Status: None   Collection Time: 04/06/22  7:58 PM   Specimen: Urine, Random  Result Value Ref [place] Status   Specimen Description   Final    URINE, RANDOM Performed at [facility], [address] [place], [place] [address]    Special Requests URINE, CLEAN CATCH  Final   Culture   Final    NO GROWTH Performed at [facility], [place] [address] [place], [place] [address]    Report Status 04/08/2022 FINAL  Final  Expectorated Sputum Assessment w Gram Stain, Rflx to Resp Cult     Status: None   Collection Time: 04/06/22  9:19 PM   Specimen: Sputum  Result Value Ref [place] Status   Specimen Description SPUTUM  Final   Special Requests NONE  Final   Sputum evaluation   Final    THIS SPECIMEN IS ACCEPTABLE FOR SPUTUM CULTURE PERORMED AT [facility] Performed at [facility], [address] [place], [place] [address]    Report Status 04/06/2022 FINAL  Final  Culture, Respiratory w Gram Stain     Status: None (Preliminary result)   Collection Time: 04/06/22  9:19 PM   Specimen: SPU  Result Value Ref [place] Status   Specimen Description   Final    SPUTUM Performed at [facility], [address], [place], [place] [address]    Special Requests   Final    NONE Reflexed from [id number] Performed at [facility], [address], [place], [place] [address]    Gram Stain   Final    MODERATE [place] RODS RARE  SQUAMOUS EPITHELIAL CELLS PRESENT RARE WBC PRESENT, PREDOMINANTLY PMN    Culture   Final    CULTURE REINCUBATED FOR BETTER GROWTH Performed at [facility], [place] [address] [place], [place] [address]    Report Status PENDING  Incomplete      Radiology Studies: DG CHEST PORT 1 VIEW  Result Date: 04/08/2022 CLINICAL DATA:  Dyspnea EXAM: PORTABLE CHEST 1 VIEW COMPARISON:  04/06/2022 FINDINGS: The cardio pericardial silhouette is enlarged. Interstitial markings are diffusely coarsened with chronic features. There is pulmonary vascular congestion without overt pulmonary edema. No focal airspace consolidation or pleural effusion. Bones are diffusely demineralized. Telemetry leads overlie the chest. IMPRESSION: Enlargement of the cardiopericardial silhouette with pulmonary vascular congestion. Electronically Signed   By: Misty Stanley M.D.   On: 04/08/2022 12:38   ECHOCARDIOGRAM COMPLETE  Result Date: 04/07/2022    ECHOCARDIOGRAM REPORT   Patient Name:   [name] Date of Exam: 04/07/2022 Medical Rec #:  [id number]      Height:       65.0 in Accession #:    [id number]     Weight:       162.5 lb Date of Birth:  1945/08/31     BSA:          1.811 m Patient Age:    79 years       BP:           120/66 mmHg Patient Gender: F              HR:           91 bpm. Exam Location:  [facility] Procedure: 2D Echo, Cardiac Doppler, Color Doppler and Intracardiac            Opacification Agent Indications:    R94.31 Abnormal EKG  History:        Patient has no prior history of Echocardiogram examinations.                 Abnormal ECG, COPD, Arrythmias:Atrial Fibrillation; Risk                 Factors:Hypertension, Current Smoker and Dyslipidemia. Breast                 cancer.  Sonographer:    Roseanna Rainbow RDCS Referring Phys: [id number] [facility]  Sonographer Comments: Technically difficult study due to poor echo windows. IMPRESSIONS  1. Left ventricular ejection fraction, by estimation, is 35 to 40%. The left  ventricle has moderately decreased function. The left ventricle demonstrates regional wall motion abnormalities (see scoring diagram/findings for description). There is mild concentric left ventricular hypertrophy. Left ventricular diastolic parameters are indeterminate.  2. Right ventricular systolic function is normal. The right ventricular size is normal. Tricuspid regurgitation signal is inadequate for assessing PA pressure.  3. The mitral valve is grossly normal. Trivial mitral valve regurgitation.  4. The aortic valve is tricuspid. There is mild calcification of the aortic valve. Aortic valve regurgitation is not visualized. Aortic valve sclerosis is present, with no evidence of aortic valve stenosis.  5. The inferior vena cava is normal in size with greater than 50% respiratory variability, suggesting right atrial pressure of 3 mmHg. Comparison(s): No prior Echocardiogram. FINDINGS  Left Ventricle: Left ventricular ejection fraction, by estimation, is 35 to 40%. The left ventricle has moderately decreased function. The left ventricle demonstrates regional wall motion abnormalities. Definity contrast agent was given IV to delineate the left ventricular endocardial borders. The left ventricular  internal cavity size was normal in size. There is mild concentric left ventricular hypertrophy. Left ventricular diastolic function could not be evaluated due to atrial fibrillation. Left ventricular diastolic parameters are indeterminate.  LV Wall Scoring: The inferior wall, mid anteroseptal segment, mid inferoseptal segment, and basal inferoseptal segment are hypokinetic. The entire anterior wall, entire lateral wall, entire apex, and basal anteroseptal segment are normal. Right Ventricle: The right ventricular size is normal. No increase in right ventricular wall thickness. Right ventricular systolic function is normal. Tricuspid regurgitation signal is inadequate for assessing PA pressure. Left Atrium: Left atrial  size was normal in size. Right Atrium: Right atrial size was normal in size. Pericardium: There is no evidence of pericardial effusion. Mitral Valve: The mitral valve is grossly normal. Trivial mitral valve regurgitation. Tricuspid Valve: The tricuspid valve is grossly normal. Tricuspid valve regurgitation is trivial. Aortic Valve: The aortic valve is tricuspid. There is mild calcification of the aortic valve. There is mild aortic valve annular calcification. Aortic valve regurgitation is not visualized. Aortic valve sclerosis is present, with no evidence of aortic valve stenosis. Pulmonic Valve: The pulmonic valve was grossly normal. Pulmonic valve regurgitation is trivial. Aorta: The aortic root is normal in size and structure. Venous: The inferior vena cava is normal in size with greater than 50% respiratory variability, suggesting right atrial pressure of 3 mmHg. IAS/Shunts: No atrial level shunt detected by color flow Doppler.  LEFT VENTRICLE PLAX 2D LVIDd:         4.50 cm LVIDs:         4.20 cm LV PW:         1.30 cm LV IVS:        1.20 cm LVOT diam:     2.40 cm LV SV:         86 LV SV Index:   48 LVOT Area:     4.52 cm  LV Volumes (MOD) LV vol d, MOD A2C: 90.1 ml LV vol d, MOD A4C: 89.1 ml LV vol s, MOD A2C: 46.5 ml LV vol s, MOD A4C: 50.2 ml LV SV MOD A2C:     43.6 ml LV SV MOD A4C:     89.1 ml LV SV MOD BP:      39.6 ml RIGHT VENTRICLE            IVC RV S prime:     9.03 cm/s  IVC diam: 2.10 cm TAPSE (M-mode): 1.5 cm LEFT ATRIUM             Index        RIGHT ATRIUM           Index LA diam:        3.70 cm 2.04 cm/m   RA Area:     12.50 cm LA Vol (A2C):   37.8 ml 20.87 ml/m  RA Volume:   28.50 ml  15.74 ml/m LA Vol (A4C):   44.9 ml 24.79 ml/m LA Biplane Vol: 41.7 ml 23.03 ml/m  AORTIC VALVE LVOT Vmax:   114.00 cm/s LVOT Vmean:  77.100 cm/s LVOT VTI:    0.191 m  AORTA Ao Root diam: 3.10 cm Ao Asc diam:  3.10 cm MITRAL VALVE MV Area (PHT): 7.16 cm     SHUNTS MV Decel Time: 106 msec     Systemic VTI:   0.19 m MV E velocity: 139.00 cm/s  Systemic Diam: 2.40 cm Rozann Lesches MD Electronically signed by Rozann Lesches MD Signature Date/Time: 04/07/2022/12:22:07 PM  Final      Scheduled Meds:  albuterol  2.5 mg Nebulization Q4H   aspirin EC  81 mg Oral Q breakfast   calcium-vitamin D  1 tablet Oral BID   Chlorhexidine Gluconate Cloth  6 each Topical Daily   cyanocobalamin  500 mcg Oral Daily   cyclobenzaprine  10 mg Oral QHS   dextromethorphan-guaiFENesin  1 tablet Oral BID   enoxaparin (LOVENOX) injection  40 mg Subcutaneous Q24H   methylPREDNISolone (SOLU-MEDROL) injection  40 mg Intravenous Q12H   metoprolol tartrate  12.5 mg Oral BID   mometasone-formoterol  2 puff Inhalation BID   polyethylene glycol powder  17 g Oral Daily   rosuvastatin  10 mg Oral Daily   sodium chloride flush  3 mL Intravenous Q12H   sodium chloride flush  3 mL Intravenous Q12H   spironolactone  50 mg Oral Daily   Continuous Infusions:  sodium chloride     amiodarone 30 mg/hr (04/08/22 1426)   cefTRIAXone (ROCEPHIN)  IV Stopped (04/08/22 1416)   doxycycline (VIBRAMYCIN) IV Stopped (04/08/22 0443)     LOS: 1 day    Roxan Hockey M.D on 04/08/2022 at 2:28 PM  Go to [email or web address] - for contact info  [facility] - Office  [phone]  If 7PM-7AM, please contact night-coverage [email or web address] 04/08/2022, 2:28 PM

## 2022-04-08 NOTE — Progress Notes (Signed)
In to check on patient due to monitor alarming pulse ox of 84%.  Patient found with oxygen in nares appropriatley, mouth breathing with respirations in the mid to high 30s at this time.  Oxygen was increased to 6L.  RT was called to evaluate patient and MD was paged and made aware at this time.  Labetalol recently given for high BP and repeat at this time is 100/80 (85).  Patient will arouse when name is called but drifts back off with eyes closed.

## 2022-04-08 NOTE — Progress Notes (Signed)
ANTICOAGULATION CONSULT NOTE - Initial Consult  Pharmacy Consult for Heparin Indication: New onset Afib  Allergies  Allergen Reactions   Penicillins Hives, Shortness Of Breath and Swelling    Has patient had a PCN reaction causing immediate rash, facial/tongue/throat swelling, SOB or lightheadedness with hypotension: yes Has patient had a PCN reaction causing severe rash involving mucus membranes or skin necrosis:no Has patient had a PCN reaction that required hospitalization: yes Has patient had a PCN reaction occurring within the last 10 years: no If all of the above answers are "NO", then may proceed with Cephalosporin use. Tolerates rocephin/keflex   Sulfonamide Derivatives Other (See Comments)    Shaking all over, seizure like symptoms. Hospitalization resulted    Avapro [Irbesartan] Other (See Comments)    Pt associates avapro with bruising though aware scientifically not the case, wants to chanmge med   Levaquin [Levofloxacin] Itching    Patient Measurements: Height: 5\' 5"  (165.1 cm) Weight: 73.7 kg (162 lb 7.7 oz) IBW/kg (Calculated) : 57 Heparin Dosing Weight: 72 kg  Vital Signs: Temp: 97.9 F (36.6 C) (03/24 1140) Temp Source: Oral (03/24 1140) BP: 110/73 (03/24 1400) Pulse Rate: 79 (03/24 1400)  Labs: Recent Labs    04/06/22 1300 04/06/22 1410 04/06/22 1552 04/07/22 0556 04/08/22 0502  HGB 14.5  --   --   --   --   HCT 45.3  --   --   --   --   PLT 391  --   --   --   --   APTT 32  --   --   --   --   LABPROT 14.0  --   --   --   --   INR 1.1  --   --   --   --   CREATININE 0.96  --   --  0.66  --   TROPONINIHS  --  50* 50*  --  17    Estimated Creatinine Clearance: 59.2 mL/min (by C-G formula based on SCr of 0.66 mg/dL).  Assessment: 77 yo female with new onset Afib, pharmacy consulted for heparin management. Patient received last prophylactic lovenox yesterday evening. No contraindications noted.   Goal of Therapy:  Heparin level 0.3-0.7  units/ml Monitor platelets by anticoagulation protocol: Yes   Plan:  Give 3500 units bolus x 1 Start heparin infusion at 1000 units/hr Check anti-Xa level in 8 hours and daily while on heparin Monitor daily heparin levels, CBC, and signs of bleeding.  [name] 04/08/2022,2:48 PM

## 2022-04-08 NOTE — Plan of Care (Signed)

## 2022-04-08 NOTE — Progress Notes (Signed)
Critical result called from lab at this time from ABG results.  PH of 7.18 and PCO2 of 82.  MD made aware.  No new orders received sated to continue bipap as discussed.

## 2022-04-08 NOTE — Progress Notes (Signed)
[place] for Heparin Indication: New onset Afib Brief A/P: Heparin level within goal range Continue Heparin at current rate   Allergies  Allergen Reactions   Penicillins Hives, Shortness Of Breath and Swelling    Has patient had a PCN reaction causing immediate rash, facial/tongue/throat swelling, SOB or lightheadedness with hypotension: yes Has patient had a PCN reaction causing severe rash involving mucus membranes or skin necrosis:no Has patient had a PCN reaction that required hospitalization: yes Has patient had a PCN reaction occurring within the last 10 years: no If all of the above answers are "NO", then may proceed with Cephalosporin use. Tolerates rocephin/keflex   Sulfonamide Derivatives Other (See Comments)    Shaking all over, seizure like symptoms. Hospitalization resulted    Avapro [Irbesartan] Other (See Comments)    Pt associates avapro with bruising though aware scientifically not the case, wants to chanmge med   Levaquin [Levofloxacin] Itching    Patient Measurements: Height: 5\' 5"  (165.1 cm) Weight: 73.7 kg (162 lb 7.7 oz) IBW/kg (Calculated) : 57 Heparin Dosing Weight: 72 kg  Vital Signs: Temp: 97.5 F (36.4 C) (03/24 1636) Temp Source: Axillary (03/24 1636) BP: 140/72 (03/24 2100) Pulse Rate: 57 (03/24 2100)  Labs: Recent Labs    04/06/22 1300 04/06/22 1410 04/06/22 1552 04/07/22 0556 04/08/22 0502 04/08/22 1528 04/08/22 2314  HGB 14.5  --   --   --   --  13.2  --   HCT 45.3  --   --   --   --  43.9  --   PLT 391  --   --   --   --  346  --   APTT 32  --   --   --   --   --   --   LABPROT 14.0  --   --   --   --   --   --   INR 1.1  --   --   --   --   --   --   HEPARINUNFRC  --   --   --   --   --   --  0.50  CREATININE 0.96  --   --  0.66  --   --   --   TROPONINIHS  --  50* 50*  --  17  --   --      Estimated Creatinine Clearance: 59.2 mL/min (by C-G formula based on SCr of 0.66  mg/dL).  Assessment: 77 y.o. female with AFib for heparin  Goal of Therapy:  Heparin level 0.3-0.7 units/ml Monitor platelets by anticoagulation protocol: Yes   Plan:  No change to heparin  Follow-up am labs.   Caryl Pina 04/08/2022,11:46 PM

## 2022-04-09 ENCOUNTER — Encounter (HOSPITAL_COMMUNITY): Payer: Self-pay | Admitting: Family Medicine

## 2022-04-09 DIAGNOSIS — I4891 Unspecified atrial fibrillation: Secondary | ICD-10-CM | POA: Diagnosis not present

## 2022-04-09 DIAGNOSIS — J4489 Other specified chronic obstructive pulmonary disease: Secondary | ICD-10-CM | POA: Diagnosis not present

## 2022-04-09 DIAGNOSIS — J9601 Acute respiratory failure with hypoxia: Secondary | ICD-10-CM | POA: Diagnosis not present

## 2022-04-09 DIAGNOSIS — I5020 Unspecified systolic (congestive) heart failure: Secondary | ICD-10-CM

## 2022-04-09 DIAGNOSIS — J9602 Acute respiratory failure with hypercapnia: Secondary | ICD-10-CM

## 2022-04-09 LAB — CBC
HCT: 42.1 % (ref 36.0–46.0)
Hemoglobin: 12.6 g/dL (ref 12.0–15.0)
MCH: 28.2 pg (ref 26.0–34.0)
MCHC: 29.9 g/dL — ABNORMAL LOW (ref 30.0–36.0)
MCV: 94.2 fL (ref 80.0–100.0)
Platelets: 356 10*3/uL (ref 150–400)
RBC: 4.47 MIL/uL (ref 3.87–5.11)
RDW: 14.7 % (ref 11.5–15.5)
WBC: 12 10*3/uL — ABNORMAL HIGH (ref 4.0–10.5)
nRBC: 0 % (ref 0.0–0.2)

## 2022-04-09 LAB — BLOOD GAS, ARTERIAL
Acid-Base Excess: 2.5 mmol/L — ABNORMAL HIGH (ref 0.0–2.0)
Bicarbonate: 29.3 mmol/L — ABNORMAL HIGH (ref 20.0–28.0)
Drawn by: 10555
FIO2: 30 %
O2 Saturation: 95 %
Patient temperature: 37
pCO2 arterial: 53 mmHg — ABNORMAL HIGH (ref 32–48)
pH, Arterial: 7.35 (ref 7.35–7.45)
pO2, Arterial: 68 mmHg — ABNORMAL LOW (ref 83–108)

## 2022-04-09 LAB — BASIC METABOLIC PANEL
Anion gap: 10 (ref 5–15)
BUN: 35 mg/dL — ABNORMAL HIGH (ref 8–23)
CO2: 25 mmol/L (ref 22–32)
Calcium: 10 mg/dL (ref 8.9–10.3)
Chloride: 108 mmol/L (ref 98–111)
Creatinine, Ser: 0.86 mg/dL (ref 0.44–1.00)
GFR, Estimated: >60 mL/min (ref >60)
Glucose, Bld: 114 mg/dL — ABNORMAL HIGH (ref 70–99)
Potassium: 3.7 mmol/L (ref 3.5–5.1)
Sodium: 143 mmol/L (ref 135–145)

## 2022-04-09 LAB — CULTURE, RESPIRATORY W GRAM STAIN: Culture: NORMAL

## 2022-04-09 LAB — HEPARIN LEVEL (UNFRACTIONATED): Heparin Unfractionated: 0.31 IU/mL (ref 0.30–0.70)

## 2022-04-09 LAB — GLUCOSE, CAPILLARY
Glucose-Capillary: 109 mg/dL — ABNORMAL HIGH (ref 70–99)
Glucose-Capillary: 146 mg/dL — ABNORMAL HIGH (ref 70–99)

## 2022-04-09 MED ORDER — SPIRONOLACTONE 25 MG PO TABS
25.0000 mg | ORAL_TABLET | Freq: Every day | ORAL | Status: DC
  Administered 2022-04-09 – 2022-04-15 (×7): 25 mg via ORAL
  Filled 2022-04-09 (×7): qty 1

## 2022-04-09 MED ORDER — FUROSEMIDE 10 MG/ML IJ SOLN
40.0000 mg | Freq: Every day | INTRAMUSCULAR | Status: DC
  Administered 2022-04-09 – 2022-04-13 (×5): 40 mg via INTRAVENOUS
  Filled 2022-04-09 (×5): qty 4

## 2022-04-09 MED ORDER — BISOPROLOL FUMARATE 5 MG PO TABS
5.0000 mg | ORAL_TABLET | Freq: Every day | ORAL | Status: DC
  Administered 2022-04-09 – 2022-04-17 (×9): 5 mg via ORAL
  Filled 2022-04-09 (×9): qty 1

## 2022-04-09 MED ORDER — IPRATROPIUM-ALBUTEROL 0.5-2.5 (3) MG/3ML IN SOLN
3.0000 mL | RESPIRATORY_TRACT | Status: DC
  Administered 2022-04-09 – 2022-04-10 (×10): 3 mL via RESPIRATORY_TRACT
  Filled 2022-04-09 (×10): qty 3

## 2022-04-09 MED ORDER — GUAIFENESIN ER 600 MG PO TB12
1200.0000 mg | ORAL_TABLET | Freq: Two times a day (BID) | ORAL | Status: DC
  Administered 2022-04-09 – 2022-04-17 (×17): 1200 mg via ORAL
  Filled 2022-04-09 (×17): qty 2

## 2022-04-09 MED ORDER — POTASSIUM CHLORIDE CRYS ER 10 MEQ PO TBCR
10.0000 meq | EXTENDED_RELEASE_TABLET | Freq: Every day | ORAL | Status: DC
  Administered 2022-04-09 – 2022-04-10 (×2): 10 meq via ORAL
  Filled 2022-04-09 (×2): qty 1

## 2022-04-09 NOTE — Consult Note (Addendum)
NAME:  [name], MRN:  [id number], DOB:  01/16/1945, LOS: 2 ADMISSION DATE:  04/06/2022, CONSULTATION DATE:  07/09/22  REFERRING MD:  Richarda Blade   CHIEF COMPLAINT:  resp distress    History of Present Illness:  61 yowf active smoking with GOLD 3 COPD/ AB  preferred symbicort 160 over brezti at last ov in 01/18/22 and says doing ok until maybe a week PTA with worsening sob with adls assoc with cough/ congestion thick white mucus > ER 3/22 with [facility] p arrival being eval by cards for afib control and in meantime required bipap for hypercarbic resp failure which had not bee her prior baseline so pt referred 3/25 to [facility] service.   No h/o cp N or V or fever, chills   W/u in hosp significant fow new CHF      Significant Hospital Events: Including procedures, antibiotic start and stop dates in addition to other pertinent events   Echo 04/07/22 Left ventricular ejection fraction, by estimation, is 35 to 40%. The  left ventricle has moderately decreased function. The left ventricle  demonstrates regional wall motion abnormalities (see scoring  diagram/findings for description). There is mild  concentric left ventricular hypertrophy.    2. Right ventricular systolic function is normal. The right ventricular  size is normal. Tricuspid regurgitation signal is inadequate for assessing  PA pressure.   3. The mitral valve is grossly normal. Trivial mitral valve  regurgitation.   4. The aortic valve is tricuspid. There is mild calcification of the  aortic valve.     5. The inferior vena cava is normal in size with greater than 50%  respiratory variability, suggesting right atrial pressure of 3 mmHg.     Scheduled Meds:  aspirin EC  81 mg Oral Q breakfast   bisoprolol  5 mg Oral Daily   calcium-vitamin D  1 tablet Oral BID   Chlorhexidine Gluconate Cloth  6 each Topical Daily   cyanocobalamin  500 mcg Oral Daily   cyclobenzaprine  10 mg Oral QHS   furosemide  40 mg Intravenous Daily    guaiFENesin  1,200 mg Oral BID   ipratropium-albuterol  3 mL Nebulization Q4H   methylPREDNISolone (SOLU-MEDROL) injection  40 mg Intravenous Q12H   mometasone-formoterol  2 puff Inhalation BID   polyethylene glycol powder  17 g Oral Daily   potassium chloride  10 mEq Oral Daily   rosuvastatin  10 mg Oral Daily   sodium chloride flush  3 mL Intravenous Q12H   sodium chloride flush  3 mL Intravenous Q12H   spironolactone  25 mg Oral Daily   Continuous Infusions:  sodium chloride     sodium chloride Stopped (04/09/22 0703)   amiodarone 30 mg/hr (04/09/22 1013)   cefTRIAXone (ROCEPHIN)  IV Stopped (04/08/22 1416)   doxycycline (VIBRAMYCIN) IV Stopped (04/09/22 0303)   heparin 1,100 Units/hr (04/09/22 1214)   PRN Meds:.sodium chloride, acetaminophen **OR** acetaminophen, albuterol, bisacodyl, labetalol, levalbuterol, ondansetron **OR** ondansetron (ZOFRAN) IV, pantoprazole, polyethylene glycol, sodium chloride flush, traMADol, traZODone   Interim History / Subjective:  Much improved per nursing with bipap overnight Recognizes me from the office/ no cp and minimal sob at rest on NP / ate some lunch ok  Objective   Blood pressure (!) 177/120, pulse (!) 119, temperature 98.5 F (36.9 C), temperature source Oral, resp. rate (!) 22, height 5\' 5"  (1.651 m), weight 73.7 kg, SpO2 100 %.    FiO2 (%):  [30 %-40 %] 30 %   Intake/Output  Summary (Last 24 hours) at 04/09/2022 1255 Last data filed at 04/09/2022 1000 Gross per 24 hour  Intake 2782.76 ml  Output 450 ml  Net 2332.76 ml   Filed Weights   04/06/22 1302 04/07/22 1104 04/08/22 0500  Weight: 72.6 kg 73.7 kg 73.7 kg    Examination: Tmax:  99 General appearance:    eldelry wf/ congested cough   At Rest 02 sats  100% on 2.5 lpm   No jvd Oropharynx clear,  mucosa nl Neck supple Lungs with a few scattered exp > insp rhonchi bilaterally RRR no s3 or or sign murmur Abd soft/ limited  excursion  Extr warm with no edema or clubbing  noted Neuro  Sensorium intact,  no apparent motor deficits     I personally reviewed images and agree with radiology impression as follows:  CXR:  portable 04/08/22 Enlargement of the cardiopericardial silhouette with pulmonary vascular congestion.     Assessment & Plan:  1) COPD GOLD 3/ newly 02 dep  Active smoker  - PFT's  03/03/2012  FEV1 1.69 (69 % ) ratio 0.72  p 5 % improvement from saba p ? prior to study with DLCO  17.42 (68%) corrects to 4.53 (92%)  for alv volume and FV curve min curvature/ ERV 37%   - try off acei 11/19/2019 and return for medication reconciliation in 6 weeks - 12/30/2020  After extensive coaching inhaler device,  effectiveness =    75% from a baseline of 50%  - 07/19/2021  After extensive coaching inhaler device,  effectiveness =    75% (short Ti)  - PFT's  09/26/21  FEV1 0.97 (44 % ) ratio 0.54  p 5 % improvement from saba p ? prior to study with DLCO  12.37 (62%)   and FV curve not typical for copd/ air trapping by lung volumes     >>> rapid decline since 2014 with GOLD 3 criteria / ongoing smoking/ will need alpha one AT phenotype checked on return to office - in meantime agreee with everything you are already doing and rec max mucinex/ flutter valve >>> ordered/ oob when feasible and prn bipap while in bed  >>> d/c on 02 for sats < 89% walking or sitting plus tapering pred rx     2) 02 dep acute hypercarbic and hypoxemic RF >>> bipap prn, likely needs back on 02 at home  3) CHF/ rapid afib > see echo above - ? Component of cardiac asthma? - cards already following, on amiodarone (new start) so be on lookout for toxicity  (serial ESR and sats best way to do this    Labs   CBC: Recent Labs  Lab 04/06/22 1300 04/08/22 1528 04/09/22 0419  WBC 19.6* 16.7* 12.0*  NEUTROABS 14.9*  --   --   HGB 14.5 13.2 12.6  HCT 45.3 43.9 42.1  MCV 89.2 93.6 94.2  PLT 391 346 [id number]    Basic Metabolic Panel: Recent Labs  Lab 04/06/22 1300 04/07/22 0556  04/09/22 0419  NA 134* 137 143  K 3.8 4.0 3.7  CL 98 104 108  CO2 26 24 25   GLUCOSE 138* 145* 114*  BUN 32* 21 35*  CREATININE 0.96 0.66 0.86  CALCIUM 9.3 9.2 10.0   GFR: Estimated Creatinine Clearance: 55.1 mL/min (by C-G formula based on SCr of 0.86 mg/dL). Recent Labs  Lab 04/06/22 1300 04/06/22 1350 04/06/22 1410 04/06/22 1523 04/08/22 1528 04/09/22 0419  PROCALCITON  --   --  0.26  --   --   --  WBC 19.6*  --   --   --  16.7* 12.0*  LATICACIDVEN  --  1.7  --  1.2  --   --     Liver Function Tests: Recent Labs  Lab 04/06/22 1300  AST 23  ALT 18  ALKPHOS 64  BILITOT 0.8  PROT 8.0  ALBUMIN 3.6   No results for input(s): "LIPASE", "AMYLASE" in the last 168 hours. No results for input(s): "AMMONIA" in the last 168 hours.  ABG    Component Value Date/Time   PHART 7.35 04/09/2022 0037   PCO2ART 53 (H) 04/09/2022 0037   PO2ART 68 (L) 04/09/2022 0037   HCO3 29.3 (H) 04/09/2022 0037   ACIDBASEDEF 0.6 04/08/2022 1212   O2SAT 95 04/09/2022 0037     Coagulation Profile: Recent Labs  Lab 04/06/22 1300  INR 1.1    Cardiac Enzymes: No results for input(s): "CKTOTAL", "CKMB", "CKMBINDEX", "TROPONINI" in the last 168 hours.  HbA1C: Hgb A1c MFr Bld  Date/Time Value Ref Range Status  12/01/2021 02:46 PM 6.3 (H) 4.8 - 5.6 % Final    Comment:             Prediabetes: 5.7 - 6.4          Diabetes: >6.4          Glycemic control for adults with diabetes: <7.0   06/05/2021 08:07 AM 6.1 (H) 4.8 - 5.6 % Corrected    Comment:    This is a corrected report. The previously reported result was: Hemoglobin A1c           8.3        HI %               06/06/2021          Prediabetes: 5.7 - 6.4          Diabetes: >6.4          Glycemic control for adults with diabetes: <7.0     CBG: Recent Labs  Lab 04/08/22 1718 04/08/22 2345 04/09/22 0506 04/09/22 1103  GLUCAP 139* 114* 109* 146*      Past Medical History:  She,  has a past medical history of Aortic  atherosclerosis ([place]), Breast cancer ([place]) (2007), Breast cancer, left breast ([place]) (2007), COPD (chronic obstructive pulmonary disease) ([place]), Coronary artery calcification seen on CAT scan, Diabetes mellitus without complication ([place]), Diverticula of colon, rickettsial disease, Hyperlipidemia, Hypertension, Kidney stones, Nicotine dependence, and Osteoarthritis.   Surgical History:   Past Surgical History:  Procedure Laterality Date   ABDOMINAL HYSTERECTOMY     BREAST SURGERY  2005 approx   left lumpectomy    CATARACT EXTRACTION W/PHACO Left 05/01/2019   Procedure: CATARACT EXTRACTION PHACO AND INTRAOCULAR LENS PLACEMENT LEFT EYE CDE=11.73;  Surgeon: Baruch Goldmann, MD;  Location: [facility];  Service: Ophthalmology;  Laterality: Left;  left   CATARACT EXTRACTION W/PHACO Right 05/15/2019   Procedure: CATARACT EXTRACTION PHACO AND INTRAOCULAR LENS PLACEMENT RIGHT EYE;  Surgeon: Baruch Goldmann, MD;  Location: [facility];  Service: Ophthalmology;  Laterality: Right;  CDE: 16.07   CHOLECYSTECTOMY N/A 05/26/2014   Procedure: LAPAROSCOPIC CHOLECYSTECTOMY;  Surgeon: Aviva Signs Md, MD;  Location: [facility];  Service: General;  Laterality: N/A;   COLECTOMY     2005, diverticulitis   COLONOSCOPY N/A 05/20/2017    Surgeon: Danie Binder, MD;  five 3-6 mm polyps (4 tubular adenomas, 1 hyperplastic polyp), tortuous left colon, internal hemorrhoids.  Recommended repeat colonoscopy in 3 years.  COLONOSCOPY WITH PROPOFOL N/A 07/12/2020   Procedure: COLONOSCOPY WITH PROPOFOL;  Surgeon: Eloise Harman, DO;  Location: [facility];  Service: Endoscopy;  Laterality: N/A;  12:30pm   DILATION AND CURETTAGE OF UTERUS     ESOPHAGOGASTRODUODENOSCOPY N/A 05/20/2017   Surgeon: Danie Binder, MD; normal esophagus and mild gastritis due to aspirin.  Biopsies negative for H. pylori.   left breast      cancer, in 2006   POLYPECTOMY  07/12/2020   Procedure: POLYPECTOMY;  Surgeon: Eloise Harman, DO;  Location: [facility];   Service: Endoscopy;;   TUBAL LIGATION       Social History:   reports that she has been smoking cigarettes. She has a 25.00 pack-year smoking history. She has never used smokeless tobacco. She reports that she does not drink alcohol and does not use drugs.   Family History:  Her family history includes Breast cancer in her mother; COPD in her father; Emphysema in her father; Glaucoma in her brother; Heart attack in her brother; Hepatitis C in her daughter; Lung cancer in her sister; SIDS in her son; Schizophrenia in her brother; Seizures in her daughter; Throat cancer in her sister. There is no history of Colon cancer or Colon polyps.   Allergies Allergies  Allergen Reactions   Penicillins Hives, Shortness Of Breath and Swelling    Has patient had a PCN reaction causing immediate rash, facial/tongue/throat swelling, SOB or lightheadedness with hypotension: yes Has patient had a PCN reaction causing severe rash involving mucus membranes or skin necrosis:no Has patient had a PCN reaction that required hospitalization: yes Has patient had a PCN reaction occurring within the last 10 years: no If all of the above answers are "NO", then may proceed with Cephalosporin use. Tolerates rocephin/keflex   Sulfonamide Derivatives Other (See Comments)    Shaking all over, seizure like symptoms. Hospitalization resulted    Avapro [Irbesartan] Other (See Comments)    Pt associates avapro with bruising though aware scientifically not the case, wants to chanmge med   Levaquin [Levofloxacin] Itching     Home Medications  Prior to Admission medications   Medication Sig Start Date End Date Taking? Authorizing Provider  acetaminophen (TYLENOL) 325 MG tablet Take 650 mg by mouth every 6 (six) hours as needed for moderate pain.    Yes [provider]  albuterol (PROVENTIL) (2.5 MG/3ML) 0.083% nebulizer solution INHALE ONE VIAL VIA NEBULIZER THREE TIMES DAILY. Patient taking differently: Take 2.5 mg  by nebulization 3 (three) times daily as needed for wheezing or shortness of breath. 07/08/18  Yes Perlie Mayo, NP  albuterol (VENTOLIN HFA) 108 (90 Base) MCG/ACT inhaler INHALE (2) PUFFS EVERY FOURHOURS AS NEEDED ONLY IF YOU CANT CATCH YOUR BREATH Patient taking differently: Inhale 2 puffs into the lungs every 4 (four) hours as needed for wheezing or shortness of breath. INHALE (2) PUFFS EVERY FOURHOURS AS NEEDED ONLY IF YOU CANT CATCH YOUR BREATH 05/12/21  Yes Lindell Spar, MD  amLODipine (NORVASC) 5 MG tablet TAKE (1) TABLET BY MOUOTH ONCE DAILY. Patient taking differently: Take 5 mg by mouth daily. TAKE (1) TABLET BY MOUOTH ONCE DAILY. 02/21/22  Yes [name], MD  aspirin EC 81 MG tablet Take 81 mg by mouth daily.   Yes [provider]  ATROVENT HFA 17 MCG/ACT inhaler INHALE 2 PUFFS BY MOUTH FOUR TIMES A DAY. Patient taking differently: Inhale 2 puffs into the lungs in the morning, at noon, in the evening, and  at bedtime. INHALE 2 PUFFS BY MOUTH FOUR TIMES A DAY. 03/20/22  Yes [name], MD  Calcium Carbonate-Vitamin D (CALCIUM 600 + D PO) Take 1 tablet by mouth 2 (two) times daily.   Yes [provider]  Cyanocobalamin (VITAMIN B 12) 500 MCG TABS Take 500 mcg by mouth daily. Take one tablet by mouth once daily 08/18/20  Yes [name], MD  cyclobenzaprine (FLEXERIL) 10 MG tablet Take 1 tablet (10 mg total) by mouth at bedtime. One tablet every night at bedtime as needed for spasm. 04/03/22  Yes Sanjuana Kava, MD  fluticasone (FLONASE) 50 MCG/ACT nasal spray PLACE 2 SPRAYS IN EACH NOSTRIL DAILY. Patient taking differently: Place 2 sprays into both nostrils daily. 03/20/22  Yes [name], MD  montelukast (SINGULAIR) 10 MG tablet TAKE 1 TABLET BY MOUTH AT BEDTIME. 12/18/21  Yes [name], MD  Naphazoline HCl (CLEAR EYES OP) Place 1 drop into both eyes daily as needed (irritation).   Yes [provider]  nicotine (NICODERM CQ -  DOSED IN MG/24 HR) 7 mg/24hr patch Place 1 patch (7 mg total) onto the skin daily. 01/18/22  Yes [name], MD  pantoprazole (PROTONIX) 40 MG tablet TAKE ONE TABLET BY MOUTH ONCE DAILY BEFORE BREAKFAST. Patient taking differently: Take 40 mg by mouth daily as needed (acid reflux). 12/15/21  Yes Tanda Rockers, MD  polyethylene glycol powder (GLYCOLAX/MIRALAX) powder MIX 1 CAPFUL IN 8 OUNCES OF JUICE OR WATER AND DRINK ONCE DAILY. Patient taking differently: Take 17 g by mouth daily. 04/23/16  Yes [name], MD  Potassium 99 MG TABS Take 99 mg by mouth daily.   Yes [provider]  rosuvastatin (CRESTOR) 10 MG tablet TAKE 1 TABLET BY MOUTH ONCE A DAY. Patient taking differently: Take 10 mg by mouth daily. 12/18/21  Yes [name], MD  spironolactone (ALDACTONE) 50 MG tablet TAKE 1 TABLET BY MOUTH ONCE A DAY. Patient taking differently: Take 50 mg by mouth daily. 02/16/22  Yes [name], MD  SYMBICORT 160-4.5 MCG/ACT inhaler INHALE 2 PUFFS INTO THE LUNGS TWICE DAILY. Patient taking differently: Inhale 2 puffs into the lungs 2 (two) times daily. 03/20/22  Yes [name], MD  traMADol (ULTRAM) 50 MG tablet One tablet every eight hours as needed for pain. Patient taking differently: Take 50 mg by mouth every 8 (eight) hours as needed for moderate pain or severe pain. One tablet every eight hours as needed for pain. 04/03/22  Yes Sanjuana Kava, MD  benzonatate (TESSALON) 100 MG capsule Take 1 capsule (100 mg total) by mouth 2 (two) times daily as needed for cough. Patient not taking: Reported on 04/06/2022 01/18/22   [name], MD  diclofenac Sodium (VOLTAREN) 1 % GEL Apply 2 g topically 4 (four) times daily. Patient not taking: Reported on 04/06/2022 11/13/21   Fransico Meadow, MD  erythromycin base (E-MYCIN) 500 MG tablet Take 1 tablet (500 mg total) by mouth 3 (three) times daily. Patient not taking: Reported on 04/06/2022 01/18/22   [name], MD  Misc. Devices MISC Please provide patient with mastectomy bra and prosthesis. Dx: History of left breast cancer 08/25/20   Derek Jack, MD       The patient is critically ill with multiple organ systems failure and requires high complexity decision making for assessment and support, frequent evaluation and titration of therapies, application of advanced monitoring technologies and extensive interpretation of multiple databases. Critical  Care Time devoted to patient care services described in this note is 38 minutes.   Christinia Gully, MD [facility] [phone]   After 7:00 pm call Elink  [phone]

## 2022-04-09 NOTE — Progress Notes (Signed)
PROGRESS NOTE    Patient: [name]                            PCP: [name], MD                    DOB: 11/07/1945            DOA: 04/06/2022 [id number]             DOS: 04/09/2022, 2:07 PM   LOS: 2 days   Date of Service: The patient was seen and examined on 04/09/2022  Subjective:   The patient was seen and examined this morning, in mild acute respiratory distress, shortness of breath, tachycardic with heart rate of 119 and A-fib, tachypneic respiratory rate of 22, hypertensive blood pressure 177/120, afebrile  Otherwise patient is awake alert, following commands  On amiodarone drip, on heparin drip,  Brief Narrative:   Brief Narrative:  [name] is a 77 y.o. female with past medical history relevant for COPD/asthma in the setting of ongoing tobacco use, as well as history of left breast cancer, diabetes type 2 and hypertension admitted on 04/07/2022 with acute hypoxic and hypercapnic respiratory failure in the setting of community-acquired pneumonia/fibrillation ablation as well as new onset atrial fibrillation and low EF.  Assessment and Plan:     1)Acute COPD exacerbation/tobacco abuse - -remain in respiratory distress on 2.5 L of oxygen, satting 100% Was on BiPAP overnight with FiO2 of 30  -- secondary to respiratory infection - -continue IV steroids bronchodilators, mucolytic's and Rocephin/doxycycline as ordered -Appreciate pulmonary -Dr. Wanita Chamberlain input   2)Presumed CAP--- patient presented with productive cough with colored sputum, low-grade fevers and leukocytosis --Chest x-ray suggestive of possible atypical/viral pneumonia,  -Procalcitonin 0.26 -COVID, RSV and influenza negative -Respiratory culture NGTD -Acute on chronic WBC is 19.6 >>> 12.0   3)New onset A-fib with RVR- --While in the ED patient is found to be in A-fib with RVR which is new for her -Troponin 50 >> 50 >> 17 -TSH WNL -Initial EKG with sinus tach, while in the ED  patient was noted to be very tachycardic on the monitor with concerns for A-fib repeat EKG suggested A-fib with RVR -Echo with EF of 35 to 40% with regional wall motion abnormalities--The inferior wall, mid anteroseptal segment, mid inferoseptal segment, and basal inferoseptal segment are hypokinetic  --Stopped Iv Cardizem and transitioned to amiodarone and metoprolol--due to low EF - Tachycardia induced cardiomyopathy with low EF Versus ischemic cardiomyopathy -Continue IV amiodarone drip,  -Continue IV heparin  -Consulted and discussed the case with Dr. Domenic Polite appreciate close follow-up and recommendations    4) acute hypoxic and hypercapnic respiratory failure--- due to #1, #2 #3 above --in the ED patient is found to be hypoxic with O2 sats of 84% on room air and 90% on 2 L with productive  04/08/22 -Hypoxia persisted -Patient developed significant lethargy/metabolic encephalopathy/unresponsiveness-able--G consistent with uncompensated respiratory acidosis due to hypercapnia with a pH of 7.18 and pCO2 of 82 -Initiated on BiPAP on 04/08/2022   5)HFrEF-----acute systolic dysfunction CHF- --Troponin 50 >> 50 >> 17 Echo with EF of 35 to 40% with regional wall motion abnormalities--The inferior wall, mid anteroseptal segment, mid inferoseptal segment, and basal inferoseptal segment are hypokinetic  --Tachycardia induced cardiomyopathy with low EF Versus ischemic cardiomyopathy -Per cardiology recommendation continue metoprolol 12.5 mg twice daily, spironolactone 50 mg daily, Monitoring BUN/creatinine closely -Discontinuing amlodipine -Appreciate  follow-up with cardiology with recommendations    6)HTN--discontinue amlodipine and continue Metoprolol -IV labetalol as needed persistently elevated BP   7)DM2-recent A1c 6.3% reflecting excellent diabetic control PTA -Anticipate worsening glycemic control while on steroids for #1 above Use Novolog/Humalog Sliding scale insulin with  Accu-Cheks/Fingersticks as ordered    8)Social/Ethics--- discussed with patient's granddaughter [name] who is patient's primary caregiver -Patient is a full code without limitations to treatment   ------------------------------------------------------------------------------------------------------------------------- Nutritional status:  The patient's BMI is: Body mass index is 27.04 kg/m. I agree with the assessment and plan as outlined below: Nutrition Status:      ------------------------------------------------------------------------------------------------------------------------ Cultures; Blood Cultures x 2 >> NGT Sputum Culture >> NGT    ------------------------------------------------------------------------------------------------------------------------------------------------  DVT prophylaxis:  SCDs Start: 04/06/22 1655 Place TED hose Start: 04/06/22 1655   Code Status:   Code Status: Full Code  Family Communication: No family member present at bedside- attempt will be made to update daily The above findings and plan of care has been discussed with patient (and family)  in detail,  they expressed understanding and agreement of above. -Advance care planning has been discussed.   Admission status:   Status is: Inpatient Remains inpatient appropriate because: Needing close monitoring and trending in intensive care unit for acute respiratory failure, A-fib with RVR   Disposition: From  - home             Planning for discharge in 1-2 days: to   Procedures:   No admission procedures for hospital encounter.   Antimicrobials:  Anti-infectives (From admission, onward)    Start     Dose/Rate Route Frequency Ordered Stop   04/07/22 1400  cefTRIAXone (ROCEPHIN) 2 g in sodium chloride 0.9 % 100 mL IVPB        2 g 200 mL/hr over 30 Minutes Intravenous Every 24 hours 04/06/22 1421     04/07/22 1400  azithromycin (ZITHROMAX) 500 mg in sodium chloride 0.9  % 250 mL IVPB  Status:  Discontinued        500 mg 250 mL/hr over 60 Minutes Intravenous Every 24 hours 04/06/22 1421 04/06/22 1516   04/07/22 1400  doxycycline (VIBRAMYCIN) 100 mg in sodium chloride 0.9 % 250 mL IVPB        100 mg 125 mL/hr over 120 Minutes Intravenous Every 12 hours 04/06/22 1514     04/06/22 1330  levofloxacin (LEVAQUIN) IVPB 750 mg        750 mg 100 mL/hr over 90 Minutes Intravenous  Once 04/06/22 1328 04/06/22 1438        Medication:   aspirin EC  81 mg Oral Q breakfast   bisoprolol  5 mg Oral Daily   calcium-vitamin D  1 tablet Oral BID   Chlorhexidine Gluconate Cloth  6 each Topical Daily   cyanocobalamin  500 mcg Oral Daily   cyclobenzaprine  10 mg Oral QHS   furosemide  40 mg Intravenous Daily   guaiFENesin  1,200 mg Oral BID   ipratropium-albuterol  3 mL Nebulization Q4H   methylPREDNISolone (SOLU-MEDROL) injection  40 mg Intravenous Q12H   mometasone-formoterol  2 puff Inhalation BID   polyethylene glycol powder  17 g Oral Daily   potassium chloride  10 mEq Oral Daily   rosuvastatin  10 mg Oral Daily   sodium chloride flush  3 mL Intravenous Q12H   sodium chloride flush  3 mL Intravenous Q12H   spironolactone  25 mg Oral Daily    sodium chloride, acetaminophen **OR**  acetaminophen, albuterol, bisacodyl, labetalol, levalbuterol, ondansetron **OR** ondansetron (ZOFRAN) IV, pantoprazole, polyethylene glycol, sodium chloride flush, traMADol, traZODone   Objective:   Vitals:   04/09/22 0900 04/09/22 1000 04/09/22 1109 04/09/22 1112  BP: (!) 160/104 (!) 177/120    Pulse: 75 (!) 119    Resp: (!) 23 (!) 22    Temp:    98.5 F (36.9 C)  TempSrc:    Oral  SpO2: 92% 93% 100%   Weight:      Height:        Intake/Output Summary (Last 24 hours) at 04/09/2022 1407 Last data filed at 04/09/2022 1000 Gross per 24 hour  Intake 2705.67 ml  Output 450 ml  Net 2255.67 ml   Filed Weights   04/06/22 1302 04/07/22 1104 04/08/22 0500  Weight: 72.6 kg 73.7  kg 73.7 kg     Physical examination:   Constitution:  Alert, cooperative, no distress,  Appears calm and comfortable  Psychiatric:   Normal and stable mood and affect, cognition intact,   HEENT:        Normocephalic, PERRL, otherwise with in Normal limits  Chest:         Chest symmetric Cardio vascular: Irregularly irregular, no Rubs or Gallops  pulmonary: Diffusely wheezing improved, rhonchi, lower lobe crackles, mildly labored breathing  Abdomen: Soft, non-tender, non-distended, bowel sounds,no masses, no organomegaly Muscular skeletal: Limited exam - in bed, able to move all 4 extremities,   Neuro: CNII-XII intact. , normal motor and sensation, reflexes intact  Extremities: No pitting edema lower extremities, +2 pulses  Skin: Dry, warm to touch, negative for any Rashes, No open wounds Wounds: per nursing documentation   ------------------------------------------------------------------------------------------------------------------------------------------    LABs:     Latest Ref Rng & Units 04/09/2022    4:19 AM 04/08/2022    3:28 PM 04/06/2022    1:00 PM  CBC  WBC 4.0 - 10.5 K/uL 12.0  16.7  19.6   Hemoglobin 12.0 - 15.0 g/dL 12.6  13.2  14.5   Hematocrit 36.0 - 46.0 % 42.1  43.9  45.3   Platelets 150 - 400 K/uL 356  346  391       Latest Ref Rng & Units 04/09/2022    4:19 AM 04/07/2022    5:56 AM 04/06/2022    1:00 PM  CMP  Glucose 70 - 99 mg/dL 114  145  138   BUN 8 - 23 mg/dL 35  21  32   Creatinine 0.44 - 1.00 mg/dL 0.86  0.66  0.96   Sodium 135 - 145 mmol/L 143  137  134   Potassium 3.5 - 5.1 mmol/L 3.7  4.0  3.8   Chloride 98 - 111 mmol/L 108  104  98   CO2 22 - 32 mmol/L 25  24  26    Calcium 8.9 - 10.3 mg/dL 10.0  9.2  9.3   Total Protein 6.5 - 8.1 g/dL   8.0   Total Bilirubin 0.3 - 1.2 mg/dL   0.8   Alkaline Phos 38 - 126 U/L   64   AST 15 - 41 U/L   23   ALT 0 - 44 U/L   18        Micro Results Recent Results (from the past 240 hour(s))  Blood  Culture (routine x 2)     Status: None (Preliminary result)   Collection Time: 04/06/22  1:50 PM   Specimen: BLOOD  Result Value Ref Range Status   Specimen Description  BLOOD RIGHT ANTECUBITAL  Final   Special Requests   Final    BOTTLES DRAWN AEROBIC AND ANAEROBIC Blood Culture adequate volume   Culture   Final    NO GROWTH 3 DAYS Performed at [facility], [address] [place], [place] [address]    Report Status PENDING  Incomplete  Blood Culture (routine x 2)     Status: None (Preliminary result)   Collection Time: 04/06/22  1:50 PM   Specimen: BLOOD  Result Value Ref Range Status   Specimen Description BLOOD LEFT ANTECUBITAL  Final   Special Requests   Final    BOTTLES DRAWN AEROBIC AND ANAEROBIC Blood Culture adequate volume   Culture   Final    NO GROWTH 3 DAYS Performed at [facility], [address], [place], [place] [address]    Report Status PENDING  Incomplete  Resp panel by RT-PCR (RSV, Flu A&B, Covid) Anterior Nasal Swab     Status: None   Collection Time: 04/06/22  2:09 PM   Specimen: Anterior Nasal Swab  Result Value Ref Range Status   SARS Coronavirus 2 by RT PCR NEGATIVE NEGATIVE Final    Comment: (NOTE) SARS-CoV-2 target nucleic acids are NOT DETECTED.  The SARS-CoV-2 RNA is generally detectable in upper respiratory specimens during the acute phase of infection. The lowest concentration of SARS-CoV-2 viral copies this assay can detect is 138 copies/mL. A negative result does not preclude SARS-Cov-2 infection and should not be used as the sole basis for treatment or other patient management decisions. A negative result may occur with  improper specimen collection/handling, submission of specimen other than nasopharyngeal swab, presence of viral mutation(s) within the areas targeted by this assay, and inadequate number of viral copies(<138 copies/mL). A negative result must be combined with clinical observations, patient history, and  epidemiological information. The expected result is Negative.  Fact Sheet for Patients:  EntrepreneurPulse.com.au  Fact Sheet for Healthcare Providers:  IncredibleEmployment.be  This test is no t yet approved or cleared by the [place] FDA and  has been authorized for detection and/or diagnosis of SARS-CoV-2 by FDA under an Emergency Use Authorization (EUA). This EUA will remain  in effect (meaning this test can be used) for the duration of the COVID-19 declaration under Section 564(b)(1) of the Act, 21 U.S.C.section 360bbb-3(b)(1), unless the authorization is terminated  or revoked sooner.       Influenza A by PCR NEGATIVE NEGATIVE Final   Influenza B by PCR NEGATIVE NEGATIVE Final    Comment: (NOTE) The Xpert Xpress SARS-CoV-2/FLU/RSV plus assay is intended as an aid in the diagnosis of influenza from Nasopharyngeal swab specimens and should not be used as a sole basis for treatment. Nasal washings and aspirates are unacceptable for Xpert Xpress SARS-CoV-2/FLU/RSV testing.  Fact Sheet for Patients: EntrepreneurPulse.com.au  Fact Sheet for Healthcare Providers: IncredibleEmployment.be  This test is not yet approved or cleared by the [place] FDA and has been authorized for detection and/or diagnosis of SARS-CoV-2 by FDA under an Emergency Use Authorization (EUA). This EUA will remain in effect (meaning this test can be used) for the duration of the COVID-19 declaration under Section 564(b)(1) of the Act, 21 U.S.C. section 360bbb-3(b)(1), unless the authorization is terminated or revoked.     Resp Syncytial Virus by PCR NEGATIVE NEGATIVE Final    Comment: (NOTE) Fact Sheet for Patients: EntrepreneurPulse.com.au  Fact Sheet for Healthcare Providers: IncredibleEmployment.be  This test is not yet approved or cleared by the [place] and  has been  authorized for detection and/or diagnosis of SARS-CoV-2 by FDA under an Emergency Use Authorization (EUA). This EUA will remain in effect (meaning this test can be used) for the duration of the COVID-19 declaration under Section 564(b)(1) of the Act, 21 U.S.C. section 360bbb-3(b)(1), unless the authorization is terminated or revoked.  Performed at [facility], [address], [place], [place] [address]   Urine Culture     Status: None   Collection Time: 04/06/22  7:58 PM   Specimen: Urine, Random  Result Value Ref Range Status   Specimen Description   Final    URINE, RANDOM Performed at [facility], [address] [place], [place] [address]    Special Requests URINE, CLEAN CATCH  Final   Culture   Final    NO GROWTH Performed at [facility], [place] [address] [place], [place] [address]    Report Status 04/08/2022 FINAL  Final  Expectorated Sputum Assessment w Gram Stain, Rflx to Resp Cult     Status: None   Collection Time: 04/06/22  9:19 PM   Specimen: Sputum  Result Value Ref Range Status   Specimen Description SPUTUM  Final   Special Requests NONE  Final   Sputum evaluation   Final    THIS SPECIMEN IS ACCEPTABLE FOR SPUTUM CULTURE PERORMED AT [facility] Performed at [facility], [address] [place], [place] [address]    Report Status 04/06/2022 FINAL  Final  Culture, Respiratory w Gram Stain     Status: None   Collection Time: 04/06/22  9:19 PM   Specimen: SPU  Result Value Ref Range Status   Specimen Description   Final    SPUTUM Performed at [facility], [address], [place], [place] [address]    Special Requests   Final    NONE Reflexed from [id number] Performed at [facility], [address], [place], [place] [address]    Gram Stain   Final    MODERATE GRAM POSITIVE COCCI FEW GRAM NEGATIVE RODS RARE SQUAMOUS EPITHELIAL CELLS PRESENT RARE WBC PRESENT, PREDOMINANTLY PMN    Culture   Final    MODERATE Normal respiratory flora-no Staph aureus or Pseudomonas  seen Performed at [facility], [place] [address], [place], [place] [address]    Report Status 04/09/2022 FINAL  Final  MRSA Next Gen by PCR, Nasal     Status: None   Collection Time: 04/07/22 12:22 PM   Specimen: Nasal Mucosa; Nasal Swab  Result Value Ref Range Status   MRSA by PCR Next Gen NOT DETECTED NOT DETECTED Final    Comment: (NOTE) The GeneXpert MRSA Assay (FDA approved for NASAL specimens only), is one component of a comprehensive MRSA colonization surveillance program. It is not intended to diagnose MRSA infection nor to guide or monitor treatment for MRSA infections. Test performance is not FDA approved in patients less than 50 years old. Performed at [facility], [address] [place], [place] [address]     Radiology Reports No results found.  SIGNED: Deatra James, MD, FHM. FAAFP. Zacarias Pontes - [facility] Time spent > 77 min.  Of critical care time was spent in seeing, evaluating and examining the patient. Reviewing medical records, labs, drawn plan of care. [facility],  Pager (please use [email or web address] to page/ text) Please use Epic Secure Chat for non-urgent communication (7AM-7PM)  If 7PM-7AM, please contact night-coverage [email or web address], 04/09/2022, 2:07 PM

## 2022-04-09 NOTE — Hospital Course (Addendum)
Brief Narrative:  [name] is a 77 y.o. female with past medical history relevant for COPD/asthma in the setting of ongoing tobacco use, as well as history of left breast cancer, diabetes type 2 and hypertension admitted on 04/07/2022 with acute hypoxic and hypercapnic respiratory failure in the setting of community-acquired pneumonia/fibrillation ablation as well as new onset atrial fibrillation and low EF.  Assessment and Plan:     1)Acute COPD exacerbation/tobacco abuse -acute respiratory failure -continue to improve--down to 2 L of oxygen, satting 91% Mildly tachypneic with RR of 21,  Repeating ABG: 7.4 3/48/76 Obtaining CT angiogram: Negative for pulmonary embolism, CAD, small to moderate left pleural effusion, patchy infiltrate in both lower lobes ?  Interstitial pneumonia  --Likely secondary to respiratory infection - -continue nebs, tapering down steroids, de-escalating antibiotics -Appreciate pulmonary -Dr. Melvyn Novas input input   2)Presumed CAP-- - POA: productive cough with colored sputum, low-grade fevers and leukocytosis --Chest x-ray suggestive of possible atypical/viral pneumonia,  -Procalcitonin 0.26 -COVID, RSV and influenza negative -Respiratory culture NGTD -Acute on chronic WBC is 19.6 >>> 12.0 >>> 10.9, 10.7>>17.8 (on steroids) -De-escalating antibiotics, switch to p.o. azithromycin   3)New onset A-fib with RVR- -heart rate has improved, stabilized -Was on ICU on amiodarone drip has been switched to p.o.  -Was on heparin drip switched to p.o. Eliquis  -Troponin 50 >> 50 >> 17 -TSH WNL  - Tachycardia induced cardiomyopathy with low EF Versus ischemic cardiomyopathy -Discussed with cardiology-appreciate close follow-up Recommended to continue Bisoprolo and PO amiodarone, Eliquis, Entresto 24/26 mg twice a day  Aldactone and IV Lasix,  Repleted potassium, checking magnesium    4) acute hypoxic and hypercapnic respiratory failure--- due to #1, #2 #3  above -Continue to improve, down to 1 L of oxygen, satting 91%  --POA - in the ED patient is found to be hypoxic with O2 sats of 84% on room air and 90% on 2 L with productive   -Hypoxia persisted -POA developed encephalopathy due to hypoxia -resolved  POA: uncompensated respiratory acidosis due to hypercapnia with a pH of 7.18 and pCO2 of 82 - was on BiPAP 04/08/2022 -tapered off now on oxygen via nasal cannula -Repeat ABG as needed   - CTA of the chest today reviewed negative for pulmonary embolism     5)HFrEF-----acute systolic dysfunction CHF- Filed Weights   04/06/22 1302 04/07/22 1104 04/08/22 0500  Weight: 72.6 kg 73.7 kg 73.7 kg    Intake/Output Summary (Last 24 hours) at 04/13/2022 1149 Last data filed at 04/13/2022 1000 Gross per 24 hour  Intake 120 ml  Output 2150 ml  Net -2030 ml  continue Bisoprolo and IV amiodarone added Entresto 24/26 mg twice daily today 04/10/2022 To continue Aldactone and IV Lasix, repleting potassium, checking magnesiu   --Troponin 50 >> 50 >> 17 -- REDs: 23 now   Echo with EF of 35 to 40% with regional wall motion abnormalities--The inferior wall, mid anteroseptal segment, mid inferoseptal segment, and basal inferoseptal segment are hypokinetic  --Tachycardia induced cardiomyopathy with low EF Versus ischemic cardiomyopathy -Per cardiology recommendation continue:  Bisoprolol, spironolactone 50 mg daily, aspirin, Entresto, Aldactone, Crestor, -Cont  IV Lasix 03/14/22 - start farxiga 10mg  daily.   Monitoring BUN/creatinine closely  -Appreciate follow-up with cardiology with recommendations    6)HTN--continue Bisoprolo and PO amiodarone added Entresto 24/26 mg twice daily today 04/10/2022 To continue Aldactone and IV Lasix,    7)DM2-recent A1c 6.3% reflecting excellent diabetic control PTA -Hyperglycemic on steroids Use Novolog/Humalog Sliding scale  insulin with Accu-Cheks/Fingersticks as ordered    8)Social/Ethics--- called patient's  daughter [name] and granddaughter [name] who is patient's primary caregiver-full scope of care, comorbidities, and steep decline the patient has been discussed in detail -Patient is a full code without limitations to treatment at this point   -Patient remains full code  At this point patient's prognosis is poor due to acute respiratory failure due to pneumonia, and A-fib with RVR, labile vitals, significantly reduced heart failure new onset.   Consulting palliative care team -following

## 2022-04-09 NOTE — Progress Notes (Signed)
ANTICOAGULATION CONSULT NOTE -   Pharmacy Consult for Heparin Indication: New onset Afib  Allergies  Allergen Reactions   Penicillins Hives, Shortness Of Breath and Swelling    Has patient had a PCN reaction causing immediate rash, facial/tongue/throat swelling, SOB or lightheadedness with hypotension: yes Has patient had a PCN reaction causing severe rash involving mucus membranes or skin necrosis:no Has patient had a PCN reaction that required hospitalization: yes Has patient had a PCN reaction occurring within the last 10 years: no If all of the above answers are "NO", then may proceed with Cephalosporin use. Tolerates rocephin/keflex   Sulfonamide Derivatives Other (See Comments)    Shaking all over, seizure like symptoms. Hospitalization resulted    Avapro [Irbesartan] Other (See Comments)    Pt associates avapro with bruising though aware scientifically not the case, wants to chanmge med   Levaquin [Levofloxacin] Itching    Patient Measurements: Height: 5\' 5"  (165.1 cm) Weight: 73.7 kg (162 lb 7.7 oz) IBW/kg (Calculated) : 57 Heparin Dosing Weight: 72 kg  Vital Signs: Temp: 97.4 F (36.3 C) (03/25 0712) Temp Source: Axillary (03/25 0712) BP: 163/102 (03/25 0719) Pulse Rate: 101 (03/25 0730)  Labs: Recent Labs    04/06/22 1300 04/06/22 1410 04/06/22 1552 04/07/22 0556 04/08/22 0502 04/08/22 1528 04/08/22 2314 04/09/22 0419  HGB 14.5  --   --   --   --  13.2  --  12.6  HCT 45.3  --   --   --   --  43.9  --  42.1  PLT 391  --   --   --   --  346  --  356  APTT 32  --   --   --   --   --   --   --   LABPROT 14.0  --   --   --   --   --   --   --   INR 1.1  --   --   --   --   --   --   --   HEPARINUNFRC  --   --   --   --   --   --  0.50 0.31  CREATININE 0.96  --   --  0.66  --   --   --  0.86  TROPONINIHS  --  50* 50*  --  17  --   --   --      Estimated Creatinine Clearance: 55.1 mL/min (by C-G formula based on SCr of 0.86 mg/dL).  Assessment: 77 yo  female with new onset Afib, pharmacy consulted for heparin management.   HL 0.31- therapeutic on low end of range CBC WNL  Goal of Therapy:  Heparin level 0.3-0.7 units/ml Monitor platelets by anticoagulation protocol: Yes   Plan:  Increase heparin infusion to 1100 units/hr Check anti-Xa level in 8 hours and daily Monitor H&H and platelets.   Margot Ables, PharmD Clinical Pharmacist 04/09/2022 8:17 AM

## 2022-04-09 NOTE — Consult Note (Addendum)
Cardiology Consultation   Patient ID: [name] MRN: [id number]; DOB: 11/14/1945  Admit date: 04/06/2022 Date of Consult: 04/09/2022  PCP:  [name], [place] Prov[facility] Cardiologist:  Tanya Dolly, MD        Patient Profile:   [name] is a 77 y.o. female with a hx of incidental coronary calcification seen on CT, aortic atherosclerosis, stage 1 breast CA tx with lumpectomy/radiation/aromasin/Fem[place], COPD, DM, HTN, HLD, kidney stones, longstanding tobacco abuse, leukocytosis, thrombocytosis, arthritis who is being seen 04/09/2022 for the evaluation of afib and abnormal echo at the request of [name].  History of Present Illness:   [name] remotely saw [name] in 2019 for multivessel/LM coronary calcification seen on CT and underwent nuclear stress test showing very mild apical defect likely due to apical thinning, less likely felt to be very mild apical ischemia; either finding supported low risk and EF 55-65%. Was due to f/u 6 months but has not been seen by cardiology since then. She does not recall any interim cardiac hx in the meantime. She lives with her daughter and has a tendency for tremors per her report.  She was admitted with 3 day history of productive cough of yellow-green sputum, fever, leukocytosis of 19k (higher than prior baseline elevation), and hypoxia, with question of interstitial edema versus atypical/viral PNA on CXR. Multiple family members were ill as well. Covid/flu/RSV were negative. TSH wnl. hsTroponin 50->50->17. She was diagnosed with AECOPD, presumed CAP, acute hypoxic/hypercarbic respiratory failure requiring BiPAP (not on O2 PTA). She was also found to be in new onset AF during ER stay - per tele/EKG review, it looks like she was in sinus tach on arrival then went into atrial fibrillation within a few hours into her ER stay. As part of her workup, had echo showing EF 35-40% + WMA (inferior wall, mid  anteroseptal segment, mid inferoseptal segment, and basal inferoseptal segment are hypokinetic), normal RV, mild calcification of aortic valve. She has been treated with steroids, nebs, abx, IV heparin, IV amiodarone, IV diltiazem, and low dose metoprolol 12.5mg  BID. IV diltiazem was discontinued 3/23 - remains on amio gtt + metoprolol, also on amlodipine. She remains in AF with rates ~120s. She is unaware of this. She has been able to de-escalate to [place] but POx hovering 93-95 on 4L. Her SOB has improved but not fully resolved. The last few days prior to admission she'd had some atypical CP with slight movements and cough, but none presently. No anginal-type CP. No edema or syncope.   Past Medical History:  Diagnosis Date   Aortic atherosclerosis ([place])    Breast cancer ([place]) 2007   Stage I (T1b N0 M0), grade 1 well-differentiated carcinoma of the left breast status, post lumpectomy followed by radiation therapy. Her estrogen receptor receptors were 93%, progesterone receptors 67%. HER-2/neu was negative. No lymphovascular space invasion was seen. All margins were clear. Ki-67 marker was low at 1% with surgery on 11/15/2004. Treated then with post-lumpectomy radiation, finish   Breast cancer, left breast ([place]) 2007   COPD (chronic obstructive pulmonary disease) ([place])    Coronary artery calcification seen on CAT scan    Diabetes mellitus without complication (HCC)    diet controlled   Diverticula of colon    Hx of rickettsial disease    Hyperlipidemia    Hypertension    Kidney stones    Nicotine dependence    Osteoarthritis     Past Surgical History:  Procedure Laterality Date   ABDOMINAL HYSTERECTOMY     BREAST SURGERY  2005 approx   left lumpectomy    CATARACT EXTRACTION W/PHACO Left 05/01/2019   Procedure: CATARACT EXTRACTION PHACO AND INTRAOCULAR LENS PLACEMENT LEFT EYE CDE=11.73;  Surgeon: Baruch Goldmann, MD;  Location: [facility];  Service: Ophthalmology;  Laterality: Left;  left    CATARACT EXTRACTION W/PHACO Right 05/15/2019   Procedure: CATARACT EXTRACTION PHACO AND INTRAOCULAR LENS PLACEMENT RIGHT EYE;  Surgeon: Baruch Goldmann, MD;  Location: [facility];  Service: Ophthalmology;  Laterality: Right;  CDE: 16.07   CHOLECYSTECTOMY N/A 05/26/2014   Procedure: LAPAROSCOPIC CHOLECYSTECTOMY;  Surgeon: Aviva Signs Md, MD;  Location: [facility];  Service: General;  Laterality: N/A;   COLECTOMY     2005, diverticulitis   COLONOSCOPY N/A 05/20/2017    Surgeon: Danie Binder, MD;  five 3-6 mm polyps (4 tubular adenomas, 1 hyperplastic polyp), tortuous left colon, internal hemorrhoids.  Recommended repeat colonoscopy in 3 years.   COLONOSCOPY WITH PROPOFOL N/A 07/12/2020   Procedure: COLONOSCOPY WITH PROPOFOL;  Surgeon: Eloise Harman, DO;  [facility];  Service: Endoscopy;  Laterality: N/A;  12:30pm   DILATION AND CURETTAGE OF UTERUS     ESOPHAGOGASTRODUODENOSCOPY N/A 05/20/2017   Surgeon: Danie Binder, MD; normal esophagus and mild gastritis due to aspirin.  Biopsies negative for H. pylori.   left breast      cancer, in 2006   POLYPECTOMY  07/12/2020   Procedure: POLYPECTOMY;  Surgeon: Eloise Harman, DO;  [facility]: [facility];  Service: Endoscopy;;   TUBAL LIGATION       Home Medications:  Prior to Admission medications   Medication Sig Start Date End Date Taking? Authorizing Provider  acetaminophen (TYLENOL) 325 MG tablet Take 650 mg by mouth every 6 (six) hours as needed for moderate pain.    Yes [provider]  albuterol (PROVENTIL) (2.5 MG/3ML) 0.083% nebulizer solution INHALE ONE VIAL VIA NEBULIZER THREE TIMES DAILY. Patient taking differently: Take 2.5 mg by nebulization 3 (three) times daily as needed for wheezing or shortness of breath. 07/08/18  Yes Perlie Mayo, NP  albuterol (VENTOLIN HFA) 108 (90 Base) MCG/ACT inhaler INHALE (2) PUFFS EVERY FOURHOURS AS NEEDED ONLY IF YOU CANT CATCH YOUR BREATH Patient taking differently: Inhale 2 puffs  into the lungs every 4 (four) hours as needed for wheezing or shortness of breath. INHALE (2) PUFFS EVERY FOURHOURS AS NEEDED ONLY IF YOU CANT CATCH YOUR BREATH 05/12/21  Yes Lindell Spar, MD  amLODipine (NORVASC) 5 MG tablet TAKE (1) TABLET BY MOUOTH ONCE DAILY. Patient taking differently: Take 5 mg by mouth daily. TAKE (1) TABLET BY MOUOTH ONCE DAILY. 02/21/22  Yes Tanya Helper, MD  aspirin EC 81 MG tablet Take 81 mg by mouth daily.   Yes [provider]  ATROVENT HFA 17 MCG/ACT inhaler INHALE 2 PUFFS BY MOUTH FOUR TIMES A DAY. Patient taking differently: Inhale 2 puffs into the lungs in the morning, at noon, in the evening, and at bedtime. INHALE 2 PUFFS BY MOUTH FOUR TIMES A DAY. 03/20/22  Yes Tanya Helper, MD  Calcium Carbonate-Vitamin D (CALCIUM 600 + D PO) Take 1 tablet by mouth 2 (two) times daily.   Yes [provider]  Cyanocobalamin (VITAMIN B 12) 500 MCG TABS Take 500 mcg by mouth daily. Take one tablet by mouth once daily 08/18/20  Yes Tanya Helper, MD  cyclobenzaprine (FLEXERIL) 10 MG tablet Take 1 tablet (10  mg total) by mouth at bedtime. One tablet every night at bedtime as needed for spasm. 04/03/22  Yes Sanjuana Kava, MD  fluticasone (FLONASE) 50 MCG/ACT nasal spray PLACE 2 SPRAYS IN EACH NOSTRIL DAILY. Patient taking differently: Place 2 sprays into both nostrils daily. 03/20/22  Yes Tanya Helper, MD  montelukast (SINGULAIR) 10 MG tablet TAKE 1 TABLET BY MOUTH AT BEDTIME. 12/18/21  Yes Tanya Helper, MD  Naphazoline HCl (CLEAR EYES OP) Place 1 drop into both eyes daily as needed (irritation).   Yes [provider]  nicotine (NICODERM CQ - DOSED IN MG/24 HR) 7 mg/24hr patch Place 1 patch (7 mg total) onto the skin daily. 01/18/22  Yes Tanya Helper, MD  pantoprazole (PROTONIX) 40 MG tablet TAKE ONE TABLET BY MOUTH ONCE DAILY BEFORE BREAKFAST. Patient taking differently: Take 40 mg by mouth daily as needed (acid reflux).  12/15/21  Yes Tanda Rockers, MD  polyethylene glycol powder (GLYCOLAX/MIRALAX) powder MIX 1 CAPFUL IN 8 OUNCES OF JUICE OR WATER AND DRINK ONCE DAILY. Patient taking differently: Take 17 g by mouth daily. 04/23/16  Yes Tanya Helper, MD  Potassium 99 MG TABS Take 99 mg by mouth daily.   Yes [provider]  rosuvastatin (CRESTOR) 10 MG tablet TAKE 1 TABLET BY MOUTH ONCE A DAY. Patient taking differently: Take 10 mg by mouth daily. 12/18/21  Yes Tanya Helper, MD  spironolactone (ALDACTONE) 50 MG tablet TAKE 1 TABLET BY MOUTH ONCE A DAY. Patient taking differently: Take 50 mg by mouth daily. 02/16/22  Yes Tanya Helper, MD  SYMBICORT 160-4.5 MCG/ACT inhaler INHALE 2 PUFFS INTO THE LUNGS TWICE DAILY. Patient taking differently: Inhale 2 puffs into the lungs 2 (two) times daily. 03/20/22  Yes Tanya Helper, MD  traMADol (ULTRAM) 50 MG tablet One tablet every eight hours as needed for pain. Patient taking differently: Take 50 mg by mouth every 8 (eight) hours as needed for moderate pain or severe pain. One tablet every eight hours as needed for pain. 04/03/22  Yes Sanjuana Kava, MD  benzonatate (TESSALON) 100 MG capsule Take 1 capsule (100 mg total) by mouth 2 (two) times daily as needed for cough. Patient not taking: Reported on 04/06/2022 01/18/22   Tanya Helper, MD  diclofenac Sodium (VOLTAREN) 1 % GEL Apply 2 g topically 4 (four) times daily. Patient not taking: Reported on 04/06/2022 11/13/21   Fransico Meadow, MD  erythromycin base (E-MYCIN) 500 MG tablet Take 1 tablet (500 mg total) by mouth 3 (three) times daily. Patient not taking: Reported on 04/06/2022 01/18/22   Tanya Helper, MD  Misc. Devices MISC Please provide patient with mastectomy bra and prosthesis. Dx: History of left breast cancer 08/25/20   Derek Jack, MD    Inpatient Medications: Scheduled Meds:  aspirin EC  81 mg Oral Q breakfast   calcium-vitamin D  1 tablet Oral BID    Chlorhexidine Gluconate Cloth  6 each Topical Daily   cyanocobalamin  500 mcg Oral Daily   cyclobenzaprine  10 mg Oral QHS   dextromethorphan-guaiFENesin  1 tablet Oral BID   ipratropium-albuterol  3 mL Nebulization Q4H   methylPREDNISolone (SOLU-MEDROL) injection  40 mg Intravenous Q12H   metoprolol tartrate  12.5 mg Oral BID   mometasone-formoterol  2 puff Inhalation BID   polyethylene glycol powder  17 g Oral Daily   rosuvastatin  10 mg Oral Daily   sodium chloride flush  3 mL Intravenous Q12H  sodium chloride flush  3 mL Intravenous Q12H   spironolactone  50 mg Oral Daily   Continuous Infusions:  sodium chloride     sodium chloride 100 mL/hr at 04/08/22 1800   amiodarone 30 mg/hr (04/09/22 0700)   cefTRIAXone (ROCEPHIN)  IV Stopped (04/08/22 1416)   doxycycline (VIBRAMYCIN) IV 100 mg (04/09/22 0103)   heparin 1,000 Units/hr (04/08/22 1800)   PRN Meds: sodium chloride, acetaminophen **OR** acetaminophen, albuterol, bisacodyl, labetalol, levalbuterol, ondansetron **OR** ondansetron (ZOFRAN) IV, pantoprazole, polyethylene glycol, sodium chloride flush, traMADol, traZODone  Allergies:    Allergies  Allergen Reactions   Penicillins Hives, Shortness Of Breath and Swelling    Has patient had a PCN reaction causing immediate rash, facial/tongue/throat swelling, SOB or lightheadedness with hypotension: yes Has patient had a PCN reaction causing severe rash involving mucus membranes or skin necrosis:no Has patient had a PCN reaction that required hospitalization: yes Has patient had a PCN reaction occurring within the last 10 years: no If all of the above answers are "NO", then may proceed with Cephalosporin use. Tolerates rocephin/keflex   Sulfonamide Derivatives Other (See Comments)    Shaking all over, seizure like symptoms. Hospitalization resulted    Avapro [Irbesartan] Other (See Comments)    Pt associates avapro with bruising though aware scientifically not the case, wants to  chanmge med   Levaquin [Levofloxacin] Itching    Social History:   Social History   Socioeconomic History   Marital status: Single    Spouse name: Not on file   Number of children: 5   Years of education: 9th grade    Highest education level: 9th grade  Occupational History   Occupation: retired    Fish farm manager: RETIRED  Tobacco Use   Smoking status: Every Day    Packs/day: 0.50    Years: 50.00    Additional pack years: 0.00    Total pack years: 25.00    Types: Cigarettes   Smokeless tobacco: Never   Tobacco comments:    smokes 1/2 pack per day 11/19/2019  Vaping Use   Vaping Use: Never used  Substance and Sexual Activity   Alcohol use: No    Alcohol/week: 0.0 standard drinks of alcohol   Drug use: No   Sexual activity: Not Currently  Other Topics Concern   Not on file  Social History Narrative   ** Merged History Encounter **       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  MARRIED WITH A RUN AWAY HUSBAND(FUGITIVE 10 YRS). 3 MISCARRIAGES, 5 CHILDREN   Social Determinants of Health   Financial Resource Strain: Low Risk  (02/19/2022)   Overall Financial Resource Strain (CARDIA)    Difficulty of Paying Living Expenses: Not hard at all  Food Insecurity: No Food Insecurity (04/07/2022)   Hunger Vital Sign    Worried About Running Out of Food in the Last Year: Never true    Ran Out of Food in the Last Year: Never true  Transportation Needs: No Transportation Needs (04/07/2022)   PRAPARE - Hydrologist (Medical): No    Lack of Transportation (Non-Medical): No  Physical Activity: Insufficiently Active (02/19/2022)   Exercise Vital Sign    Days of Exercise per Week: 5 days    Minutes of Exercise per Session: 20 min  Stress: No Stress Concern Present (02/19/2022)   [place]gs    Feeling of Stress : Not  at all  Social  Connections: Socially Isolated (02/19/2022)   Social Connection and Isolation Panel [NHANES]    Frequency of Communication with Friends and Family: More than three times a week    Frequency of Social Gatherings with Friends and Family: More than three times a week    Attends Religious Services: Never    Marine scientist or Organizations: No    Attends Archivist Meetings: Never    Marital Status: Widowed  Intimate Partner Violence: Not At Risk (04/07/2022)   Humiliation, Afraid, Rape, and Kick questionnaire    Fear of Current or Ex-Partner: No    Emotionally Abused: No    Physically Abused: No    Sexually Abused: No    Family History:   Family History  Problem Relation Age of Onset   Breast cancer Mother    COPD Father    Emphysema Father    Throat cancer Sister    Glaucoma Brother    Schizophrenia Brother    Heart attack Brother    Lung cancer Sister        former smoker   Hepatitis C Daughter    Seizures Daughter    SIDS Son    Colon cancer Neg Hx    Colon polyps Neg Hx      ROS:  Please see the history of present illness.  All other ROS reviewed and negative.     Physical Exam/Data:   Vitals:   04/09/22 0712 04/09/22 0719 04/09/22 0730 04/09/22 0731  BP:  (!) 163/102    Pulse:  (!) 107 (!) 101   Resp:  (!) 21 (!) 22   Temp: (!) 97.4 F (36.3 C)     TempSrc: Axillary     SpO2:  98% 99% 98%  Weight:      Height:        Intake/Output Summary (Last 24 hours) at 04/09/2022 0839 Last data filed at 04/09/2022 [id number] Gross per 24 hour  Intake 837.32 ml  Output 450 ml  Net 387.32 ml      04/08/2022    5:00 AM 04/07/2022   11:04 AM 04/06/2022    1:02 PM  Last 3 Weights  Weight (lbs) 162 lb 7.7 oz 162 lb 7.7 oz 160 lb  Weight (kg) 73.7 kg 73.7 kg 72.576 kg     Body mass index is 27.04 kg/m.  General: Frail appearing WF in no acute distress. Head: Normocephalic, atraumatic, sclera non-icteric, no xanthomas, nares are without discharge. Neck:  Negative for carotid bruits. JVP not elevated. Lungs: Diffusely diminished with rare scattered wheeze. No rales or rhonchi. Breathing is unlabored. Heart: Irregularly irregular, elevated rate, S1 S2 without murmurs, rubs, or gallops.  Abdomen: Soft, non-tender, non-distended with normoactive bowel sounds. No rebound/guarding. Extremities: No clubbing or cyanosis. No edema. Distal pedal pulses are 2+ and equal bilaterally. Neuro: Alert and oriented X 3. Moves all extremities spontaneously. Tremulous in mouth and left hand Psych:  Responds to questions appropriately with a normal affect.   EKG:  The EKG was personally reviewed and demonstrates:   Iniital tracing 12:50 difficult to discern with baseline artifact, read as sinus tach 108bpm, does appear that way in lead II but somewhat irregular, nonspecific STT changes with possible prior anterior infarct but f/u tracing 13:58 later showed rapid afib 158bpm nonspecific STT changes. Repeat tracing 3/23 also showed atrial fibrillation with similar appearance to prior except HR slower, 104bpm.  Telemetry:  Telemetry was personally reviewed and demonstrates:  afib  Relevant CV Studies: 2d echo 04/07/22   1. Left ventricular ejection fraction, by estimation, is 35 to 40%. The  left ventricle has moderately decreased function. The left ventricle  demonstrates regional wall motion abnormalities (see scoring  diagram/findings for description). There is mild  concentric left ventricular hypertrophy. Left ventricular diastolic  parameters are indeterminate.   2. Right ventricular systolic function is normal. The right ventricular  size is normal. Tricuspid regurgitation signal is inadequate for assessing  PA pressure.   3. The mitral valve is grossly normal. Trivial mitral valve  regurgitation.   4. The aortic valve is tricuspid. There is mild calcification of the  aortic valve. Aortic valve regurgitation is not visualized. Aortic valve  sclerosis is  present, with no evidence of aortic valve stenosis.   5. The inferior vena cava is normal in size with greater than 50%  respiratory variability, suggesting right atrial pressure of 3 mmHg.   Comparison(s): No prior Echocardiogram.   Laboratory Data:  High Sensitivity Troponin:   Recent Labs  Lab 04/06/22 1410 04/06/22 1552 04/08/22 0502  TROPONINIHS 50* 50* 17     Chemistry Recent Labs  Lab 04/06/22 1300 04/07/22 0556 04/09/22 0419  NA 134* 137 143  K 3.8 4.0 3.7  CL 98 104 108  CO2 26 24 25   GLUCOSE 138* 145* 114*  BUN 32* 21 35*  CREATININE 0.96 0.66 0.86  CALCIUM 9.3 9.2 10.0  GFRNONAA >60 >60 >60  ANIONGAP 10 9 10     Recent Labs  Lab 04/06/22 1300  PROT 8.0  ALBUMIN 3.6  AST 23  ALT 18  ALKPHOS 64  BILITOT 0.8   Hematology Recent Labs  Lab 04/06/22 1300 04/08/22 1528 04/09/22 0419  WBC 19.6* 16.7* 12.0*  RBC 5.08 4.69 4.47  HGB 14.5 13.2 12.6  HCT 45.3 43.9 42.1  MCV 89.2 93.6 94.2  MCH 28.5 28.1 28.2  MCHC 32.0 30.1 29.9*  RDW 14.4 14.8 14.7  PLT 391 346 356   Thyroid  Recent Labs  Lab 04/06/22 1552  TSH 0.501    Radiology/Studies:  DG CHEST PORT 1 VIEW  Result Date: 04/08/2022 CLINICAL DATA:  Dyspnea EXAM: PORTABLE CHEST 1 VIEW COMPARISON:  04/06/2022 FINDINGS: The cardio pericardial silhouette is enlarged. Interstitial markings are diffusely coarsened with chronic features. There is pulmonary vascular congestion without overt pulmonary edema. No focal airspace consolidation or pleural effusion. Bones are diffusely demineralized. Telemetry leads overlie the chest. IMPRESSION: Enlargement of the cardiopericardial silhouette with pulmonary vascular congestion. Electronically Signed   By: Misty Stanley M.D.   On: 04/08/2022 12:38   ECHOCARDIOGRAM COMPLETE  Result Date: 04/07/2022    ECHOCARDIOGRAM REPORT   Patient Name:   [name] Date of Exam: 04/07/2022 Medical Rec #:  [id number]      Height:       65.0 in Accession #:    [id number]      Weight:       162.5 lb Date of Birth:  03/08/1945     BSA:          1.811 m Patient Age:    31 years       BP:           120/66 mmHg Patient Gender: F              HR:           91 bpm. Exam Location:  [facility] Procedure: 2D Echo, Cardiac Doppler, Color Doppler and Intracardiac  Opacification Agent Indications:    R94.31 Abnormal EKG  History:        Patient has no prior history of Echocardiogram examinations.                 Abnormal ECG, COPD, Arrythmias:Atrial Fibrillation; Risk                 Factors:Hypertension, Current Smoker and Dyslipidemia. Breast                 cancer.  Sonographer:    Roseanna Rainbow RDCS Referring Phys: [id number] [facility]  Sonographer Comments: Technically difficult study due to poor echo windows. IMPRESSIONS  1. Left ventricular ejection fraction, by estimation, is 35 to 40%. The left ventricle has moderately decreased function. The left ventricle demonstrates regional wall motion abnormalities (see scoring diagram/findings for description). There is mild concentric left ventricular hypertrophy. Left ventricular diastolic parameters are indeterminate.  2. Right ventricular systolic function is normal. The right ventricular size is normal. Tricuspid regurgitation signal is inadequate for assessing PA pressure.  3. The mitral valve is grossly normal. Trivial mitral valve regurgitation.  4. The aortic valve is tricuspid. There is mild calcification of the aortic valve. Aortic valve regurgitation is not visualized. Aortic valve sclerosis is present, with no evidence of aortic valve stenosis.  5. The inferior vena cava is normal in size with greater than 50% respiratory variability, suggesting right atrial pressure of 3 mmHg. Comparison(s): No prior Echocardiogram. FINDINGS  Left Ventricle: Left ventricular ejection fraction, by estimation, is 35 to 40%. The left ventricle has moderately decreased function. The left ventricle demonstrates regional wall motion abnormalities.  Definity contrast agent was given IV to delineate the left ventricular endocardial borders. The left ventricular internal cavity size was normal in size. There is mild concentric left ventricular hypertrophy. Left ventricular diastolic function could not be evaluated due to atrial fibrillation. Left ventricular diastolic parameters are indeterminate.  LV Wall Scoring: The inferior wall, mid anteroseptal segment, mid inferoseptal segment, and basal inferoseptal segment are hypokinetic. The entire anterior wall, entire lateral wall, entire apex, and basal anteroseptal segment are normal. Right Ventricle: The right ventricular size is normal. No increase in right ventricular wall thickness. Right ventricular systolic function is normal. Tricuspid regurgitation signal is inadequate for assessing PA pressure. Left Atrium: Left atrial size was normal in size. Right Atrium: Right atrial size was normal in size. Pericardium: There is no evidence of pericardial effusion. Mitral Valve: The mitral valve is grossly normal. Trivial mitral valve regurgitation. Tricuspid Valve: The tricuspid valve is grossly normal. Tricuspid valve regurgitation is trivial. Aortic Valve: The aortic valve is tricuspid. There is mild calcification of the aortic valve. There is mild aortic valve annular calcification. Aortic valve regurgitation is not visualized. Aortic valve sclerosis is present, with no evidence of aortic valve stenosis. Pulmonic Valve: The pulmonic valve was grossly normal. Pulmonic valve regurgitation is trivial. Aorta: The aortic root is normal in size and structure. Venous: The inferior vena cava is normal in size with greater than 50% respiratory variability, suggesting right atrial pressure of 3 mmHg. IAS/Shunts: No atrial level shunt detected by color flow Doppler.  LEFT VENTRICLE PLAX 2D LVIDd:         4.50 cm LVIDs:         4.20 cm LV PW:         1.30 cm LV IVS:        1.20 cm LVOT diam:     2.40 cm LV SV:  86 LV SV  Index:   48 LVOT Area:     4.52 cm  LV Volumes (MOD) LV vol d, MOD A2C: 90.1 ml LV vol d, MOD A4C: 89.1 ml LV vol s, MOD A2C: 46.5 ml LV vol s, MOD A4C: 50.2 ml LV SV MOD A2C:     43.6 ml LV SV MOD A4C:     89.1 ml LV SV MOD BP:      39.6 ml RIGHT VENTRICLE            IVC RV S prime:     9.03 cm/s  IVC diam: 2.10 cm TAPSE (M-mode): 1.5 cm LEFT ATRIUM             Index        RIGHT ATRIUM           Index LA diam:        3.70 cm 2.04 cm/m   RA Area:     12.50 cm LA Vol (A2C):   37.8 ml 20.87 ml/m  RA Volume:   28.50 ml  15.74 ml/m LA Vol (A4C):   44.9 ml 24.79 ml/m LA Biplane Vol: 41.7 ml 23.03 ml/m  AORTIC VALVE LVOT Vmax:   114.00 cm/s LVOT Vmean:  77.100 cm/s LVOT VTI:    0.191 m  AORTA Ao Root diam: 3.10 cm Ao Asc diam:  3.10 cm MITRAL VALVE MV Area (PHT): 7.16 cm     SHUNTS MV Decel Time: 106 msec     Systemic VTI:  0.19 m MV E velocity: 139.00 cm/s  Systemic Diam: 2.40 cm Rozann Lesches MD Electronically signed by Rozann Lesches MD Signature Date/Time: 04/07/2022/12:22:07 PM    Final    DG Chest Port 1 View  Result Date: 04/06/2022 CLINICAL DATA:  Provided history: Questionable sepsis-evaluate for abnormality. EXAM: PORTABLE CHEST 1 VIEW COMPARISON:  Chest CT 03/23/2022. Prior chest radiographs 11/09/2021 and earlier. FINDINGS: Heart size within normal limits. Aortic atherosclerosis. Prominence of the interstitial lung markings, new from the prior chest radiographs of 11/09/2021. No appreciable airspace consolidation. No evidence of pleural effusion or pneumothorax. No acute osseous abnormality identified. Degenerative changes of the spine. IMPRESSION: Prominence of the interstitial lung markings, new from the prior chest radiographs of 11/09/2021. This may reflect interstitial edema or sequela of atypical/viral pneumonia. Aortic Atherosclerosis (ICD10-I70.0). Electronically Signed   By: Kellie Simmering D.O.   On: 04/06/2022 13:52     Assessment and Plan:   1. Acute hypoxic/hypercarbic respiratory  failure - felt multifactorial in setting of AECOPD, CAP, acute HFrEF - pulm mgmt per IM  2. Atrial fibrillation with RVR - went into this shortly after ER presentation, newly recognized - agree with IV heparin until we know no further procedures are needed, anticipate transition to [facility] when more stable - TSH wnl - currently on IV amiodarone with plenty of BP room to add back AVN blocking agents - not a great candidate for diltiazem with LV dysfunction, consider careful addition of selective beta blocker - does not appear to be shocky on exam  3. Acute HFrEF - new LV dysfunction of unclear chronicity, etiology unclear - may be tachymediated although arrhythmia did not start until after admission, cannot exclude underlying obstructive CAD given h/o coronary calcification though poor candidate to pursue intervention acutely with ongoing respiratory needs and infection - stop amlodipine - will discuss med rx with MD - ? may benefit from initiation of diuresis as f/u CXR yesterday showed enlargement of the cardiopericardial silhouette with  pulmonary vascular congestion. Otherwise on metoprolol tartrate 12.5mg  BID (metoprolol), spironolactone 50mg  daily (home med) - BUN up but Cr wnl  4. Mildly elevated troponin, coronary calcification - hsTroponin 50->50, out of proportion to LV dysfunction, do not suspect ACS but see #3 for further eval - on rosuvastatin - check lipid panel  Remainder of medical issues per primary team   Risk Assessment/Risk Scores:     New York Heart Association (NYHA) Functional Class NYHA Class IV  CHA2DS2-VASc Score = 7   This indicates a 11.2% annual risk of stroke. The patient's score is based upon: CHF History: 1 HTN History: 1 Diabetes History: 1 Stroke History: 0 Vascular Disease History: 1 Age Score: 2 Gender Score: 1    For questions or updates, please contact [place] Please consult [email or web address] for contact info under    Signed, Charlie Pitter, PA-C  04/09/2022 8:39 AM    Attending note:  Patient seen and examined.  I reviewed her records and agree with above assessment by Ms. Dunn PA-C.  Ms. Troxell is currently admitted to the hospital with acute hypoxic/hypercarbic respiratory failure in the setting of suspected COPD and pneumonia, also potential component of HFrEF with newly documented cardiomyopathy.  Echocardiogram during this hospital stay indicates LVEF 35 to 40% with wall motion abnormalities suggestive of ischemic cardiomyopathy.  She has no prior history of myocardial infarction but does have multivessel coronary artery calcification by CT imaging.  Present cardiac enzymes do not suggest ACS.  She has also had paroxysmal atrial fibrillation documented during her stay, newly diagnosed and with CHA2DS2-VASc score of 7.  On examination she is in no distress.  She is afebrile, heart rate 100-115 range in atrial fibrillation currently.  Systolic blood pressure ranging 130s to 160s recently.  She has had no net positive fluid balance.  Lungs exhibit decreased breath sounds with scattered rhonchi and faint crackles at the bases.  Cardiac exam with irregularly irregular rhythm, distant heart sounds and indistinct PMI.  No peripheral edema.  Pertinent lab work includes potassium 3.7, BUN 35, creatinine 0.86, WBC 12.0, hemoglobin 12.6, platelets 356.  Chest x-ray reports cardiomegaly with pulmonary vascular congestion.  ECG from 04/07/2022 shows atrial fibrillation with IVCD and decreased R wave progression.  HFrEF with suspected ischemic cardiomyopathy and LVEF 35 to 40%.  Whether acute or chronic cardiomyopathy is not certain, she does have a longer standing history of multivessel coronary artery calcification by CT imaging, but no clear evidence of ACS at this time.  Plan will be to optimize GDMT prior to considering further ischemic workup.  Start IV Lasix 40 mg daily.  Discontinue metoprolol and replace with  bisoprolol starting at 5 mg daily.  Decrease Aldactone to 25 mg daily.  Next step is likely addition of Entresto depending on blood pressure.  Would otherwise continue IV amiodarone and IV heparin for now, may be able to convert to oral load and DOAC presuming we do not need to pursue invasive cardiac testing during this hospital stay.  We will continue to follow with you.  Satira Sark, M.D., F.A.C.C.

## 2022-04-09 NOTE — Plan of Care (Signed)

## 2022-04-09 NOTE — Progress Notes (Signed)
  Transition of Care [facility]) Screening Note   Patient Details  Name: [name] Date of Birth: February 10, 1945   Transition of Care [facility]) CM/SW Contact:    Iona Beard, [place] Phone Number: 04/09/2022, 10:48 AM    Transition of Care Department [facility]) has reviewed patient and no TOC needs have been identified at this time. We will continue to monitor patient advancement through interdisciplinary progression rounds. If new patient transition needs arise, please place a TOC consult.

## 2022-04-09 NOTE — Progress Notes (Signed)
Patient taken off of BIPAP this morning and placed on 4L nasal cannula. Patient is tolerating well at this time. BIPAP remains at bedside if needed.

## 2022-04-09 NOTE — Progress Notes (Signed)
Pt tolerating 2.5 lpm cann very well vitals are within normal limits breathing treatment given... BIPAP on standby at bedside, RT will continue to monitor

## 2022-04-10 ENCOUNTER — Inpatient Hospital Stay (HOSPITAL_COMMUNITY): Payer: 59

## 2022-04-10 DIAGNOSIS — I5020 Unspecified systolic (congestive) heart failure: Secondary | ICD-10-CM | POA: Diagnosis not present

## 2022-04-10 DIAGNOSIS — J9601 Acute respiratory failure with hypoxia: Secondary | ICD-10-CM | POA: Diagnosis not present

## 2022-04-10 DIAGNOSIS — J9602 Acute respiratory failure with hypercapnia: Secondary | ICD-10-CM | POA: Diagnosis not present

## 2022-04-10 DIAGNOSIS — I4891 Unspecified atrial fibrillation: Secondary | ICD-10-CM | POA: Diagnosis not present

## 2022-04-10 LAB — GLUCOSE, CAPILLARY
Glucose-Capillary: 166 mg/dL — ABNORMAL HIGH (ref 70–99)
Glucose-Capillary: 149 mg/dL — ABNORMAL HIGH (ref 70–99)

## 2022-04-10 LAB — BLOOD GAS, ARTERIAL
Acid-Base Excess: 6.4 mmol/L — ABNORMAL HIGH (ref 0.0–2.0)
Bicarbonate: 31.9 mmol/L — ABNORMAL HIGH (ref 20.0–28.0)
Drawn by: 41997
O2 Saturation: 97.3 %
Patient temperature: 37
pCO2 arterial: 48 mmHg (ref 32–48)
pH, Arterial: 7.43 (ref 7.35–7.45)
pO2, Arterial: 76 mmHg — ABNORMAL LOW (ref 83–108)

## 2022-04-10 LAB — HEPARIN LEVEL (UNFRACTIONATED): Heparin Unfractionated: 0.37 IU/mL (ref 0.30–0.70)

## 2022-04-10 LAB — BASIC METABOLIC PANEL
Anion gap: 11 (ref 5–15)
BUN: 35 mg/dL — ABNORMAL HIGH (ref 8–23)
CO2: 23 mmol/L (ref 22–32)
Calcium: 9.1 mg/dL (ref 8.9–10.3)
Chloride: 107 mmol/L (ref 98–111)
Creatinine, Ser: 0.91 mg/dL (ref 0.44–1.00)
GFR, Estimated: >60 mL/min (ref >60)
Glucose, Bld: 151 mg/dL — ABNORMAL HIGH (ref 70–99)
Potassium: 3.7 mmol/L (ref 3.5–5.1)
Sodium: 141 mmol/L (ref 135–145)

## 2022-04-10 LAB — CBC
HCT: 40 % (ref 36.0–46.0)
Hemoglobin: 12.3 g/dL (ref 12.0–15.0)
MCH: 28.2 pg (ref 26.0–34.0)
MCHC: 30.8 g/dL (ref 30.0–36.0)
MCV: 91.7 fL (ref 80.0–100.0)
Platelets: 354 10*3/uL (ref 150–400)
RBC: 4.36 MIL/uL (ref 3.87–5.11)
RDW: 14.8 % (ref 11.5–15.5)
WBC: 10.9 10*3/uL — ABNORMAL HIGH (ref 4.0–10.5)
nRBC: 0 % (ref 0.0–0.2)

## 2022-04-10 LAB — LIPID PANEL
Cholesterol: 86 mg/dL (ref 0–200)
HDL: 47 mg/dL (ref >40)
LDL Cholesterol: 36 mg/dL (ref 0–99)
Total CHOL/HDL Ratio: 1.8 RATIO
Triglycerides: 15 mg/dL (ref <150)
VLDL: 3 mg/dL (ref 0–40)

## 2022-04-10 LAB — AMMONIA: Ammonia: 22 umol/L (ref 9–35)

## 2022-04-10 MED ORDER — IOHEXOL 350 MG/ML SOLN
80.0000 mL | Freq: Once | INTRAVENOUS | Status: AC | PRN
  Administered 2022-04-10: 80 mL via INTRAVENOUS

## 2022-04-10 MED ORDER — POLYETHYLENE GLYCOL 3350 17 G PO PACK
17.0000 g | PACK | Freq: Every day | ORAL | Status: DC
  Administered 2022-04-10 – 2022-04-15 (×6): 17 g via ORAL
  Filled 2022-04-10 (×8): qty 1

## 2022-04-10 MED ORDER — SACUBITRIL-VALSARTAN 24-26 MG PO TABS
1.0000 | ORAL_TABLET | Freq: Two times a day (BID) | ORAL | Status: DC
  Administered 2022-04-10 – 2022-04-17 (×15): 1 via ORAL
  Filled 2022-04-10 (×15): qty 1

## 2022-04-10 MED ORDER — IPRATROPIUM-ALBUTEROL 0.5-2.5 (3) MG/3ML IN SOLN
3.0000 mL | Freq: Three times a day (TID) | RESPIRATORY_TRACT | Status: DC
  Administered 2022-04-11 – 2022-04-16 (×16): 3 mL via RESPIRATORY_TRACT
  Filled 2022-04-10 (×16): qty 3

## 2022-04-10 MED ORDER — ORAL CARE MOUTH RINSE: 15.0000 mL | OROMUCOSAL | Status: DC | PRN

## 2022-04-10 MED ORDER — ALBUTEROL SULFATE (2.5 MG/3ML) 0.083% IN NEBU: 2.5000 mg | INHALATION_SOLUTION | RESPIRATORY_TRACT | Status: DC | PRN

## 2022-04-10 NOTE — Progress Notes (Signed)
ANTICOAGULATION CONSULT NOTE -   Pharmacy Consult for Heparin Indication: New onset Afib  Allergies  Allergen Reactions   Penicillins Hives, Shortness Of Breath and Swelling    Has patient had a PCN reaction causing immediate rash, facial/tongue/throat swelling, SOB or lightheadedness with hypotension: yes Has patient had a PCN reaction causing severe rash involving mucus membranes or skin necrosis:no Has patient had a PCN reaction that required hospitalization: yes Has patient had a PCN reaction occurring within the last 10 years: no If all of the above answers are "NO", then may proceed with Cephalosporin use. Tolerates rocephin/keflex   Sulfonamide Derivatives Other (See Comments)    Shaking all over, seizure like symptoms. Hospitalization resulted    Avapro [Irbesartan] Other (See Comments)    Pt associates avapro with bruising though aware scientifically not the case, wants to chanmge med   Levaquin [Levofloxacin] Itching    Patient Measurements: Height: 5\' 5"  (165.1 cm) Weight: 73.7 kg (162 lb 7.7 oz) IBW/kg (Calculated) : 57 Heparin Dosing Weight: 72 kg  Vital Signs: Temp: 97.5 F (36.4 C) (03/26 0734) Temp Source: Oral (03/26 0734) BP: 158/125 (03/26 0500) Pulse Rate: 101 (03/26 0500)  Labs: Recent Labs    04/08/22 0502 04/08/22 1528 04/08/22 1528 04/08/22 2314 04/09/22 0419 04/10/22 0420  HGB  --  13.2   < >  --  12.6 12.3  HCT  --  43.9  --   --  42.1 40.0  PLT  --  346  --   --  356 354  HEPARINUNFRC  --   --   --  0.50 0.31 0.37  CREATININE  --   --   --   --  0.86 0.91  TROPONINIHS 17  --   --   --   --   --    < > = values in this interval not displayed.     Estimated Creatinine Clearance: 52.1 mL/min (by C-G formula based on SCr of 0.91 mg/dL).  Assessment: 77 yo female with new onset Afib, pharmacy consulted for heparin management.   HL 0.37- therapeutic  CBC WNL  Goal of Therapy:  Heparin level 0.3-0.7 units/ml Monitor platelets by  anticoagulation protocol: Yes   Plan:  Continue heparin infusion at 1100 units/hr Check anti-Xa level daily Monitor H&H and platelets.   Margot Ables, PharmD Clinical Pharmacist 04/10/2022 7:38 AM

## 2022-04-10 NOTE — Progress Notes (Addendum)
PROGRESS NOTE    Patient: [name]                            PCP: [name], MD                    DOB: 08/16/1945            DOA: 04/06/2022 [id number]             DOS: 04/10/2022, 12:32 PM   LOS: 3 days   Date of Service: The patient was seen and examined on 04/10/2022  Subjective:   The patient was seen and examined this morning, drowsy, mild lethargic but follows command, cooperative Still tachycardic, irregular heart rate, tachypneic Elevated blood pressure  Still On amiodarone drip, on heparin drip,  Brief Narrative:   Brief Narrative:  [name] is a 77 y.o. female with past medical history relevant for COPD/asthma in the setting of ongoing tobacco use, as well as history of left breast cancer, diabetes type 2 and hypertension admitted on 04/07/2022 with acute hypoxic and hypercapnic respiratory failure in the setting of community-acquired pneumonia/fibrillation ablation as well as new onset atrial fibrillation and low EF.  Assessment and Plan:     1)Acute COPD exacerbation/tobacco abuse - -remain in respite distress, on 2.5 L of oxygen, satting 99%, but still tachypneic, tachycardic -Weaned off BiPAP  Repeating ABG Obtaining CT angiogram   -- secondary to respiratory infection - -continue IV steroids bronchodilators, mucolytic's and Rocephin/doxycycline as ordered -Appreciate pulmonary -Dr. Melvyn Novas input input   2)Presumed CAP-- - POA: productive cough with colored sputum, low-grade fevers and leukocytosis --Chest x-ray suggestive of possible atypical/viral pneumonia,  -Procalcitonin 0.26 -COVID, RSV and influenza negative -Respiratory culture NGTD -Acute on chronic WBC is 19.6 >>> 12.0 >>> 10.9    3)New onset A-fib with RVR- -still in A-fib with RVR, heart rate 53-1 61 Currently heart rate is 101, -Still on amiodarone drip  -While in the ED patient is found to be in A-fib with RVR which is new for her -Troponin 50 >> 50 >> 17 -TSH  WNL  -Echo with EF of 35 to 40% with regional wall motion abnormalities--The inferior wall, mid anteroseptal segment, mid inferoseptal segment, and basal inferoseptal segment are hypokinetic  --Stopped Iv Cardizem and transitioned to amiodarone and metoprolol--due to low EF - Tachycardia induced cardiomyopathy with low EF Versus ischemic cardiomyopathy -Continue IV amiodarone drip,  -Continue IV heparin  -Discussed the case with Dr. Domenic Polite appreciate close follow-up and recommendations Recommended to continue Bisoprolo and IV amiodarone added Entresto 24/26 mg twice daily today 04/10/2022 To continue Aldactone and IV Lasix, repleting potassium, checking magnesium    4) acute hypoxic and hypercapnic respiratory failure--- due to #1, #2 #3 above --in the ED patient is found to be hypoxic with O2 sats of 84% on room air and 90% on 2 L with productive   -Hypoxia persisted -Patient developed significant lethargy/metabolic encephalopathy/unresponsiveness-able--G consistent with uncompensated respiratory acidosis due to hypercapnia with a pH of 7.18 and pCO2 of 82 -Initiated on BiPAP on 04/08/2022 -Repeating ABG, obtaining CTA of the chest today     5)HFrEF-----acute systolic dysfunction CHF- Filed Weights   04/06/22 1302 04/07/22 1104 04/08/22 0500  Weight: 72.6 kg 73.7 kg 73.7 kg    Intake/Output Summary (Last 24 hours) at 04/10/2022 1213 Last data filed at 04/10/2022 1103 Gross per 24 hour  Intake 1177.78 ml  Output  1650 ml  Net -472.22 ml  continue Bisoprolo and IV amiodarone added Entresto 24/26 mg twice daily today 04/10/2022 To continue Aldactone and IV Lasix, repleting potassium, checking magnesiu   --Troponin 50 >> 50 >> 17 Echo with EF of 35 to 40% with regional wall motion abnormalities--The inferior wall, mid anteroseptal segment, mid inferoseptal segment, and basal inferoseptal segment are hypokinetic  --Tachycardia induced cardiomyopathy with low EF Versus ischemic  cardiomyopathy -Per cardiology recommendation continue metoprolol 12.5 mg twice daily, spironolactone 50 mg daily, Monitoring BUN/creatinine closely  -Appreciate follow-up with cardiology with recommendations    6)HTN--continue Bisoprolo and IV amiodarone added Entresto 24/26 mg twice daily today 04/10/2022 To continue Aldactone and IV Lasix,    7)DM2-recent A1c 6.3% reflecting excellent diabetic control PTA -Anticipate worsening glycemic control while on steroids for #1 above Use Novolog/Humalog Sliding scale insulin with Accu-Cheks/Fingersticks as ordered    8)Social/Ethics--- called patient's daughter [name] and granddaughter [name] who is patient's primary caregiver-full scope of care, comorbidities, and steep decline the patient has been discussed in detail -Patient is a full code without limitations to treatment at this point   Willing to discussed with the palliative care team--in anticipation of changing CODE STATUS, and clarification on goals of care  At this point patient's prognosis is poor due to acute respiratory failure due to pneumonia, and A-fib with RVR, labile vitals, significantly reduced heart failure new onset. Will call and discussed with the patient's daughter again  Consulting palliative care team   ------------------------------------------------------------------------------------------------------------------------- Nutritional status:  The patient's BMI is: Body mass index is 27.04 kg/m. I agree with the assessment and plan as outlined below: Nutrition Status:      ------------------------------------------------------------------------------------------------------------------------ Cultures; Blood Cultures x 2 >> NGT Sputum Culture >> NGT    -------------------------------------------------------------------------------------------------------------------------  Consults: PCCM/cardiology/palliative care  DVT prophylaxis:   SCDs Start: 04/06/22 1655 Place TED hose Start: 04/06/22 1655   Code Status:   Code Status: Full Code  Family Communication: No family member present at bedside- attempt will be made to update daily POA daughter [name] at [phone] The above findings and plan of care has been discussed with patient (and family)  in detail,  they expressed understanding and agreement of above. -Advance care planning has been discussed.   Admission status:   Status is: Inpatient Remains inpatient appropriate because: Needing close monitoring and trending in intensive care unit for acute respiratory failure, A-fib with RVR   Disposition: From  - home             Planning for discharge in 1-2 days: to   Procedures:   No admission procedures for hospital encounter.   Antimicrobials:  Anti-infectives (From admission, onward)    Start     Dose/Rate Route Frequency Ordered Stop   04/07/22 1400  cefTRIAXone (ROCEPHIN) 2 g in sodium chloride 0.9 % 100 mL IVPB        2 g 200 mL/hr over 30 Minutes Intravenous Every 24 hours 04/06/22 1421     04/07/22 1400  azithromycin (ZITHROMAX) 500 mg in sodium chloride 0.9 % 250 mL IVPB  Status:  Discontinued        500 mg 250 mL/hr over 60 Minutes Intravenous Every 24 hours 04/06/22 1421 04/06/22 1516   04/07/22 1400  doxycycline (VIBRAMYCIN) 100 mg in sodium chloride 0.9 % 250 mL IVPB        100 mg 125 mL/hr over 120 Minutes Intravenous Every 12 hours 04/06/22 1514     04/06/22  1330  levofloxacin (LEVAQUIN) IVPB 750 mg        750 mg 100 mL/hr over 90 Minutes Intravenous  Once 04/06/22 1328 04/06/22 1438        Medication:   aspirin EC  81 mg Oral Q breakfast   bisoprolol  5 mg Oral Daily   calcium-vitamin D  1 tablet Oral BID   Chlorhexidine Gluconate Cloth  6 each Topical Daily   cyanocobalamin  500 mcg Oral Daily   cyclobenzaprine  10 mg Oral QHS   furosemide  40 mg Intravenous Daily   guaiFENesin  1,200 mg Oral BID    ipratropium-albuterol  3 mL Nebulization Q4H   methylPREDNISolone (SOLU-MEDROL) injection  40 mg Intravenous Q12H   mometasone-formoterol  2 puff Inhalation BID   polyethylene glycol  17 g Oral Daily   potassium chloride  10 mEq Oral Daily   rosuvastatin  10 mg Oral Daily   sacubitril-valsartan  1 tablet Oral BID   sodium chloride flush  3 mL Intravenous Q12H   sodium chloride flush  3 mL Intravenous Q12H   spironolactone  25 mg Oral Daily    sodium chloride, acetaminophen **OR** acetaminophen, albuterol, bisacodyl, labetalol, levalbuterol, ondansetron **OR** ondansetron (ZOFRAN) IV, pantoprazole, polyethylene glycol, sodium chloride flush, traMADol, traZODone   Objective:   Vitals:   04/10/22 1000 04/10/22 1100 04/10/22 1112 04/10/22 1200  BP:  (!) 204/94    Pulse:      Resp: (!) 25 (!) 21  (!) 25  Temp:      TempSrc:      SpO2: 96% 97% 99% 96%  Weight:      Height:        Intake/Output Summary (Last 24 hours) at 04/10/2022 1232 Last data filed at 04/10/2022 1103 Gross per 24 hour  Intake 1177.78 ml  Output 1650 ml  Net -472.22 ml   Filed Weights   04/06/22 1302 04/07/22 1104 04/08/22 0500  Weight: 72.6 kg 73.7 kg 73.7 kg     Physical examination:   General:  Lethargic but awake, following commands, oriented, in distress with shortness of breath,   HEENT:  Normocephalic, PERRL, otherwise with in Normal limits   Neuro:  CNII-XII intact. , normal motor and sensation, reflexes intact   Lungs:   Clear to auscultation BL, Respirations unlabored,  No wheezes / crackles  Cardio:    Irregular irregular, No murmure, No Rubs or Gallops   Abdomen:  Soft, non-tender, bowel sounds active all four quadrants, no guarding or peritoneal signs.  Muscular  skeletal:  Limited exam -global generalized weaknesses - in bed, able to move all 4 extremities,   2+ pulses,  symmetric, No pitting edema  Skin:  Dry, warm to touch, negative for any Rashes,  Wounds: Please see nursing  documentation         ------------------------------------------------------------------------------------------------------------------------------------------    LABs:     Latest Ref Rng & Units 04/10/2022    4:20 AM 04/09/2022    4:19 AM 04/08/2022    3:28 PM  CBC  WBC 4.0 - 10.5 K/uL 10.9  12.0  16.7   Hemoglobin 12.0 - 15.0 g/dL 12.3  12.6  13.2   Hematocrit 36.0 - 46.0 % 40.0  42.1  43.9   Platelets 150 - 400 K/uL 354  356  346       Latest Ref Rng & Units 04/10/2022    4:20 AM 04/09/2022    4:19 AM 04/07/2022    5:56 AM  CMP  Glucose  70 - 99 mg/dL 151  114  145   BUN 8 - 23 mg/dL 35  35  21   Creatinine 0.44 - 1.00 mg/dL 0.91  0.86  0.66   Sodium 135 - 145 mmol/L 141  143  137   Potassium 3.5 - 5.1 mmol/L 3.7  3.7  4.0   Chloride 98 - 111 mmol/L 107  108  104   CO2 22 - 32 mmol/L 23  25  24    Calcium 8.9 - 10.3 mg/dL 9.1  10.0  9.2        Micro Results Recent Results (from the past 240 hour(s))  Blood Culture (routine x 2)     Status: None (Preliminary result)   Collection Time: 04/06/22  1:50 PM   Specimen: BLOOD  Result Value Ref Range Status   Specimen Description BLOOD RIGHT ANTECUBITAL  Final   Special Requests   Final    BOTTLES DRAWN AEROBIC AND ANAEROBIC Blood Culture adequate volume   Culture   Final    NO GROWTH 4 DAYS Performed at [facility], [address] [place], [place] [address]    Report Status PENDING  Incomplete  Blood Culture (routine x 2)     Status: None (Preliminary result)   Collection Time: 04/06/22  1:50 PM   Specimen: BLOOD  Result Value Ref Range Status   Specimen Description BLOOD LEFT ANTECUBITAL  Final   Special Requests   Final    BOTTLES DRAWN AEROBIC AND ANAEROBIC Blood Culture adequate volume   Culture   Final    NO GROWTH 4 DAYS Performed at [facility], [address], [place], [place] [address]    Report Status PENDING  Incomplete  Resp panel by RT-PCR (RSV, Flu A&B, Covid) Anterior Nasal Swab     Status: None    Collection Time: 04/06/22  2:09 PM   Specimen: Anterior Nasal Swab  Result Value Ref Range Status   SARS Coronavirus 2 by RT PCR NEGATIVE NEGATIVE Final    Comment: (NOTE) SARS-CoV-2 target nucleic acids are NOT DETECTED.  The SARS-CoV-2 RNA is generally detectable in upper respiratory specimens during the acute phase of infection. The lowest concentration of SARS-CoV-2 viral copies this assay can detect is 138 copies/mL. A negative result does not preclude SARS-Cov-2 infection and should not be used as the sole basis for treatment or other patient management decisions. A negative result may occur with  improper specimen collection/handling, submission of specimen other than nasopharyngeal swab, presence of viral mutation(s) within the areas targeted by this assay, and inadequate number of viral copies(<138 copies/mL). A negative result must be combined with clinical observations, patient history, and epidemiological information. The expected result is Negative.  Fact Sheet for Patients:  EntrepreneurPulse.com.au  Fact Sheet for Healthcare Providers:  IncredibleEmployment.be  This test is no t yet approved or cleared by the [place] FDA and  has been authorized for detection and/or diagnosis of SARS-CoV-2 by FDA under an Emergency Use Authorization (EUA). This EUA will remain  in effect (meaning this test can be used) for the duration of the COVID-19 declaration under Section 564(b)(1) of the Act, 21 U.S.C.section 360bbb-3(b)(1), unless the authorization is terminated  or revoked sooner.       Influenza A by PCR NEGATIVE NEGATIVE Final   Influenza B by PCR NEGATIVE NEGATIVE Final    Comment: (NOTE) The Xpert Xpress SARS-CoV-2/FLU/RSV plus assay is intended as an aid in the diagnosis of influenza from Nasopharyngeal swab specimens and should  not be used as a sole basis for treatment. Nasal washings and aspirates are unacceptable for  Xpert Xpress SARS-CoV-2/FLU/RSV testing.  Fact Sheet for Patients: EntrepreneurPulse.com.au  Fact Sheet for Healthcare Providers: IncredibleEmployment.be  This test is not yet approved or cleared by the [place] FDA and has been authorized for detection and/or diagnosis of SARS-CoV-2 by FDA under an Emergency Use Authorization (EUA). This EUA will remain in effect (meaning this test can be used) for the duration of the COVID-19 declaration under Section 564(b)(1) of the Act, 21 U.S.C. section 360bbb-3(b)(1), unless the authorization is terminated or revoked.     Resp Syncytial Virus by PCR NEGATIVE NEGATIVE Final    Comment: (NOTE) Fact Sheet for Patients: EntrepreneurPulse.com.au  Fact Sheet for Healthcare Providers: IncredibleEmployment.be  This test is not yet approved or cleared by the [place] FDA and has been authorized for detection and/or diagnosis of SARS-CoV-2 by FDA under an Emergency Use Authorization (EUA). This EUA will remain in effect (meaning this test can be used) for the duration of the COVID-19 declaration under Section 564(b)(1) of the Act, 21 U.S.C. section 360bbb-3(b)(1), unless the authorization is terminated or revoked.  Performed at [facility], [address] [place], [place] [address]   Urine Culture     Status: None   Collection Time: 04/06/22  7:58 PM   Specimen: Urine, Random  Result Value Ref Range Status   Specimen Description   Final    URINE, RANDOM Performed at [facility], [address], [place], [place] [address]    Special Requests URINE, CLEAN CATCH  Final   Culture   Final    NO GROWTH Performed at [facility], [place] [address], [place], [place] [address]    Report Status 04/08/2022 FINAL  Final  Expectorated Sputum Assessment w Gram Stain, Rflx to Resp Cult     Status: None   Collection Time: 04/06/22  9:19 PM   Specimen: Sputum  Result  Value Ref Range Status   Specimen Description SPUTUM  Final   Special Requests NONE  Final   Sputum evaluation   Final    THIS SPECIMEN IS ACCEPTABLE FOR SPUTUM CULTURE PERORMED AT [facility] Performed at [facility], [address], [place], [place] [address]    Report Status 04/06/2022 FINAL  Final  Culture, Respiratory w Gram Stain     Status: None   Collection Time: 04/06/22  9:19 PM   Specimen: SPU  Result Value Ref Range Status   Specimen Description   Final    SPUTUM Performed at [facility], [address], [place], [place] [address]    Special Requests   Final    NONE Reflexed from [id number] Performed at [facility], [address], [place], [place] [address]    Gram Stain   Final    MODERATE GRAM POSITIVE COCCI FEW GRAM NEGATIVE RODS RARE SQUAMOUS EPITHELIAL CELLS PRESENT RARE WBC PRESENT, PREDOMINANTLY PMN    Culture   Final    MODERATE Normal respiratory flora-no Staph aureus or Pseudomonas seen Performed at [facility], [place] [address] [place], [place] [address]    Report Status 04/09/2022 FINAL  Final  MRSA Next Gen by PCR, Nasal     Status: None   Collection Time: 04/07/22 12:22 PM   Specimen: Nasal Mucosa; Nasal Swab  Result Value Ref Range Status   MRSA by PCR Next Gen NOT DETECTED NOT DETECTED Final    Comment: (NOTE) The GeneXpert MRSA Assay (FDA approved  for NASAL specimens only), is one component of a comprehensive MRSA colonization surveillance program. It is not intended to diagnose MRSA infection nor to guide or monitor treatment for MRSA infections. Test performance is not FDA approved in patients less than 23 years old. Performed at [facility], [address] [place], [place] [address]     Radiology Reports No results found.  SIGNED: Deatra James, MD, FHM. FAAFP. [facility] - [facility] Time spent > 77 min.  Of critical care time was spent in seeing, evaluating and examining the patient. Reviewing medical records, labs, drawn  plan of care. [facility],  Pager (please use [email or web address] to page/ text) Please use Epic Secure Chat for non-urgent communication (7AM-7PM)  If 7PM-7AM, please contact night-coverage [email or web address], 04/10/2022, 12:32 PM

## 2022-04-10 NOTE — Progress Notes (Signed)
Progress Note  Patient Name: [name] Date of Encounter: 04/10/2022  Primary Cardiologist: Carlyle Dolly, MD  Interval Summary   Chart reviewed.  Patient reports intermittent cough and shortness of breath this morning, no chest pain.  Vital Signs    Vitals:   04/10/22 0456 04/10/22 0500 04/10/22 0734 04/10/22 0743  BP:  (!) 158/125    Pulse:  (!) 101    Resp:  (!) 27    Temp: 98.6 F (37 C)  (!) 97.5 F (36.4 C)   TempSrc: Oral  Oral   SpO2:  92%  96%  Weight:      Height:        Intake/Output Summary (Last 24 hours) at 04/10/2022 0948 Last data filed at 04/10/2022 0917 Gross per 24 hour  Intake 945.97 ml  Output 1650 ml  Net -704.03 ml   Filed Weights   04/06/22 1302 04/07/22 1104 04/08/22 0500  Weight: 72.6 kg 73.7 kg 73.7 kg    Physical Exam   GEN: No acute distress.   Neck: No JVD. Cardiac: Distant, irregularly irregular without gallop.  Respiratory: Decreased breath sounds with prolonged expiratory phase. GI: Soft, nontender, bowel sounds present. MS: Mild peripheral edema as before.  ECG/Telemetry    Telemetry shows persistent atrial fibrillation/flutter with RVR this morning.  Labs    Chemistry Recent Labs  Lab 04/06/22 1300 04/07/22 0556 04/09/22 0419 04/10/22 0420  NA 134* 137 143 141  K 3.8 4.0 3.7 3.7  CL 98 104 108 107  CO2 26 24 25 23   GLUCOSE 138* 145* 114* 151*  BUN 32* 21 35* 35*  CREATININE 0.96 0.66 0.86 0.91  CALCIUM 9.3 9.2 10.0 9.1  PROT 8.0  --   --   --   ALBUMIN 3.6  --   --   --   AST 23  --   --   --   ALT 18  --   --   --   ALKPHOS 64  --   --   --   BILITOT 0.8  --   --   --   GFRNONAA >60 >60 >60 >60  ANIONGAP 10 9 10 11     Hematology Recent Labs  Lab 04/08/22 1528 04/09/22 0419 04/10/22 0420  WBC 16.7* 12.0* 10.9*  RBC 4.69 4.47 4.36  HGB 13.2 12.6 12.3  HCT 43.9 42.1 40.0  MCV 93.6 94.2 91.7  MCH 28.1 28.2 28.2  MCHC 30.1 29.9* 30.8  RDW 14.8 14.7 14.8  PLT 346 356 354   Cardiac  Enzymes Recent Labs  Lab 04/06/22 1410 04/06/22 1552 04/08/22 0502  TROPONINIHS 50* 50* 17   Lipid Panel     Component Value Date/Time   CHOL 86 04/10/2022 0420   CHOL 168 06/05/2021 0807   TRIG 15 04/10/2022 0420   HDL 47 04/10/2022 0420   HDL 67 06/05/2021 0807   CHOLHDL 1.8 04/10/2022 0420   VLDL 3 04/10/2022 0420   LDLCALC 36 04/10/2022 0420   LDLCALC 79 06/05/2021 0807   LDLCALC 74 09/25/2019 0958   LABVLDL 22 06/05/2021 0807    Cardiac Studies   Echocardiogram 04/07/2022:  1. Left ventricular ejection fraction, by estimation, is 35 to 40%. The  left ventricle has moderately decreased function. The left ventricle  demonstrates regional wall motion abnormalities (see scoring  diagram/findings for description). There is mild  concentric left ventricular hypertrophy. Left ventricular diastolic  parameters are indeterminate.   2. Right ventricular systolic function is normal. The  right ventricular  size is normal. Tricuspid regurgitation signal is inadequate for assessing  PA pressure.   3. The mitral valve is grossly normal. Trivial mitral valve  regurgitation.   4. The aortic valve is tricuspid. There is mild calcification of the  aortic valve. Aortic valve regurgitation is not visualized. Aortic valve  sclerosis is present, with no evidence of aortic valve stenosis.   5. The inferior vena cava is normal in size with greater than 50%  respiratory variability, suggesting right atrial pressure of 3 mmHg.   Assessment & Plan   1.  HFrEF with suspected ischemic cardiomyopathy, LVEF 35 to 40%.  Started on bisoprolol along with Aldactone and IV Lasix.  Reports prior history of "bruising" on Avapro, but not clear that this was necessarily an allergy to prohibit trial of Entresto.  2.  Multivessel coronary artery calcification by CT imaging, no clear evidence of ACS during current presentation.  On aspirin and Crestor.  3.  Atrial fibrillation with RVR, recently diagnosed,  CHA2DS2-VASc score is 7.  She remains on IV heparin and IV amiodarone for now.  4.  Acute hypoxic/hypercarbic respiratory failure in the setting of COPD and pneumonia.  Per primary team.  Chart reviewed.  Heart rate and blood pressure control are suboptimal.  Continue bisoprolol and IV amiodarone, adding trial of Entresto 24/26 mg twice daily.  Continue Aldactone and IV Lasix with potassium supplement.  She remains fairly tenuous at this time in terms of comorbid status.  Still not an optimal candidate for invasive cardiac testing at this time.  For questions or updates, please contact [facility] Please consult [email or web address] for contact info under   Signed, Rozann Lesches, MD  04/10/2022, 9:48 AM

## 2022-04-11 ENCOUNTER — Encounter (HOSPITAL_COMMUNITY): Payer: Self-pay | Admitting: Family Medicine

## 2022-04-11 DIAGNOSIS — Z515 Encounter for palliative care: Secondary | ICD-10-CM

## 2022-04-11 DIAGNOSIS — J9601 Acute respiratory failure with hypoxia: Secondary | ICD-10-CM | POA: Diagnosis not present

## 2022-04-11 DIAGNOSIS — I5020 Unspecified systolic (congestive) heart failure: Secondary | ICD-10-CM | POA: Diagnosis not present

## 2022-04-11 DIAGNOSIS — I4891 Unspecified atrial fibrillation: Secondary | ICD-10-CM | POA: Diagnosis not present

## 2022-04-11 DIAGNOSIS — Z7189 Other specified counseling: Secondary | ICD-10-CM

## 2022-04-11 DIAGNOSIS — J9602 Acute respiratory failure with hypercapnia: Secondary | ICD-10-CM | POA: Diagnosis not present

## 2022-04-11 LAB — BASIC METABOLIC PANEL WITH GFR
Anion gap: 10 (ref 5–15)
BUN: 29 mg/dL — ABNORMAL HIGH (ref 8–23)
CO2: 29 mmol/L (ref 22–32)
Calcium: 9.7 mg/dL (ref 8.9–10.3)
Chloride: 100 mmol/L (ref 98–111)
Creatinine, Ser: 0.75 mg/dL (ref 0.44–1.00)
GFR, Estimated: >60 mL/min
Glucose, Bld: 133 mg/dL — ABNORMAL HIGH (ref 70–99)
Potassium: 3 mmol/L — ABNORMAL LOW (ref 3.5–5.1)
Sodium: 139 mmol/L (ref 135–145)

## 2022-04-11 LAB — MAGNESIUM: Magnesium: 1.8 mg/dL (ref 1.7–2.4)

## 2022-04-11 LAB — HEPARIN LEVEL (UNFRACTIONATED): Heparin Unfractionated: 0.44 IU/mL (ref 0.30–0.70)

## 2022-04-11 LAB — CULTURE, BLOOD (ROUTINE X 2)
Culture: NO GROWTH
Culture: NO GROWTH
Special Requests: ADEQUATE
Special Requests: ADEQUATE

## 2022-04-11 LAB — CBC
HCT: 45 % (ref 36.0–46.0)
Hemoglobin: 14.6 g/dL (ref 12.0–15.0)
MCH: 28.6 pg (ref 26.0–34.0)
MCHC: 32.4 g/dL (ref 30.0–36.0)
MCV: 88.2 fL (ref 80.0–100.0)
Platelets: 409 10*3/uL — ABNORMAL HIGH (ref 150–400)
RBC: 5.1 MIL/uL (ref 3.87–5.11)
RDW: 14.1 % (ref 11.5–15.5)
WBC: 10.7 10*3/uL — ABNORMAL HIGH (ref 4.0–10.5)
nRBC: 0 % (ref 0.0–0.2)

## 2022-04-11 MED ORDER — RISAQUAD PO CAPS
2.0000 | ORAL_CAPSULE | Freq: Three times a day (TID) | ORAL | Status: DC
  Administered 2022-04-11 – 2022-04-17 (×18): 2 via ORAL
  Filled 2022-04-11 (×18): qty 2

## 2022-04-11 MED ORDER — MAGNESIUM SULFATE 2 GM/50ML IV SOLN
2.0000 g | Freq: Once | INTRAVENOUS | Status: AC
  Administered 2022-04-11: 2 g via INTRAVENOUS
  Filled 2022-04-11: qty 50

## 2022-04-11 MED ORDER — POTASSIUM CHLORIDE CRYS ER 20 MEQ PO TBCR
40.0000 meq | EXTENDED_RELEASE_TABLET | Freq: Every day | ORAL | Status: DC
  Administered 2022-04-11: 40 meq via ORAL
  Filled 2022-04-11: qty 2

## 2022-04-11 MED ORDER — AMIODARONE HCL 200 MG PO TABS
200.0000 mg | ORAL_TABLET | Freq: Two times a day (BID) | ORAL | Status: DC
  Administered 2022-04-11 – 2022-04-17 (×13): 200 mg via ORAL
  Filled 2022-04-11 (×13): qty 1

## 2022-04-11 NOTE — Progress Notes (Signed)
Progress Note  Patient Name: [name] Date of Encounter: 04/11/2022  Primary Cardiologist: Carlyle Dolly, MD  Interval Summary   Chart reviewed.  She states that she did sleep better last night.  Breathing somewhat better, no chest pain or palpitations.  Both heart rate and blood pressure trend looks better this morning.  Vital Signs    Vitals:   04/11/22 0500 04/11/22 0600 04/11/22 0700 04/11/22 0739  BP: (!) 147/58 127/74    Pulse: (!) 53 72    Resp: (!) 21 16    Temp:   (!) 97.5 F (36.4 C)   TempSrc:   Oral   SpO2: 95% 97%  97%  Weight:      Height:        Intake/Output Summary (Last 24 hours) at 04/11/2022 0859 Last data filed at 04/11/2022 [id number] Gross per 24 hour  Intake 543 ml  Output 4050 ml  Net -3507 ml   Filed Weights   04/06/22 1302 04/07/22 1104 04/08/22 0500  Weight: 72.6 kg 73.7 kg 73.7 kg    Physical Exam   GEN: No acute distress.   Neck: No JVD. Cardiac: Irregularly irregular without gallop.  Respiratory: Decreased breath sounds with prolonged expiratory phase and scattered rhonchi. GI: Soft, nontender, bowel sounds present. MS: Mild ankle edema.  ECG/Telemetry    Telemetry reviewed showing atrial fibrillation/flutter with better heart rate control.  Labs    Chemistry Recent Labs  Lab 04/06/22 1300 04/07/22 0556 04/09/22 0419 04/10/22 0420 04/11/22 0447  NA 134*   < > 143 141 139  K 3.8   < > 3.7 3.7 3.0*  CL 98   < > 108 107 100  CO2 26   < > 25 23 29   GLUCOSE 138*   < > 114* 151* 133*  BUN 32*   < > 35* 35* 29*  CREATININE 0.96   < > 0.86 0.91 0.75  CALCIUM 9.3   < > 10.0 9.1 9.7  PROT 8.0  --   --   --   --   ALBUMIN 3.6  --   --   --   --   AST 23  --   --   --   --   ALT 18  --   --   --   --   ALKPHOS 64  --   --   --   --   BILITOT 0.8  --   --   --   --   GFRNONAA >60   < > >60 >60 >60  ANIONGAP 10   < > 10 11 10    < > = values in this interval not displayed.    Hematology Recent Labs  Lab  04/09/22 0419 04/10/22 0420 04/11/22 0447  WBC 12.0* 10.9* 10.7*  RBC 4.47 4.36 5.10  HGB 12.6 12.3 14.6  HCT 42.1 40.0 45.0  MCV 94.2 91.7 88.2  MCH 28.2 28.2 28.6  MCHC 29.9* 30.8 32.4  RDW 14.7 14.8 14.1  PLT 356 354 409*   Cardiac Enzymes Recent Labs  Lab 04/06/22 1410 04/06/22 1552 04/08/22 0502  TROPONINIHS 50* 50* 17   Lipid Panel     Component Value Date/Time   CHOL 86 04/10/2022 0420   CHOL 168 06/05/2021 0807   TRIG 15 04/10/2022 0420   HDL 47 04/10/2022 0420   HDL 67 06/05/2021 0807   CHOLHDL 1.8 04/10/2022 0420   VLDL 3 04/10/2022 0420   LDLCALC 36 04/10/2022 0420  LDLCALC 79 06/05/2021 0807   LDLCALC 74 09/25/2019 0958   LABVLDL 22 06/05/2021 0807    Cardiac Studies   Echocardiogram 04/07/2022:  1. Left ventricular ejection fraction, by estimation, is 35 to 40%. The  left ventricle has moderately decreased function. The left ventricle  demonstrates regional wall motion abnormalities (see scoring  diagram/findings for description). There is mild  concentric left ventricular hypertrophy. Left ventricular diastolic  parameters are indeterminate.   2. Right ventricular systolic function is normal. The right ventricular  size is normal. Tricuspid regurgitation signal is inadequate for assessing  PA pressure.   3. The mitral valve is grossly normal. Trivial mitral valve  regurgitation.   4. The aortic valve is tricuspid. There is mild calcification of the  aortic valve. Aortic valve regurgitation is not visualized. Aortic valve  sclerosis is present, with no evidence of aortic valve stenosis.   5. The inferior vena cava is normal in size with greater than 50%  respiratory variability, suggesting right atrial pressure of 3 mmHg.   Assessment & Plan   1.  HFrEF with suspected ischemic cardiomyopathy, LVEF 35 to 40%.  GDMT being advanced during concurrent treatment for COPD and pneumonia.  Deferring invasive cardiac testing for now.  Current regimen  includes bisoprolol, Entresto, Aldactone, and IV Lasix with potassium supplement.  Tolerating adjustments so far.  She had a significant net diuresis yesterday 3.5 liters.  2.  Multivessel coronary artery calcification by CT imaging, no clear evidence of ACS during current presentation.  On aspirin and Crestor.  3.  Atrial fibrillation with RVR, recently diagnosed, CHA2DS2-VASc score is 7.  She remains on IV heparin and IV amiodarone for now, will attempt to transition to oral amiodarone and then if she continues to improve without plan for further invasive testing, a switch to DOAC.  4.  Acute hypoxic/hypercarbic respiratory failure in the setting of COPD and pneumonia.  Per primary team.  Plan to switch from IV amiodarone to oral amiodarone load 200 mg twice daily.  Continue IV heparin for now (can eventually change to DOAC).  No change in further cardiac regimen for now including aspirin, bisoprolol, Entresto, Aldactone, and Crestor.  Continue IV Lasix with potassium supplement, may be able to cut back dosing if vigorous diuresis continues.  Renal function is stable, so no changes made today.  For questions or updates, please contact [place] Please consult [email or web address] for contact info under   Signed, Rozann Lesches, MD  04/11/2022, 8:59 AM

## 2022-04-11 NOTE — Progress Notes (Signed)
Bipap is PRN.  Not needed at this time. 

## 2022-04-11 NOTE — Progress Notes (Signed)
ANTICOAGULATION CONSULT NOTE -   Pharmacy Consult for Heparin Indication: New onset Afib  Allergies  Allergen Reactions   Penicillins Hives, Shortness Of Breath and Swelling    Has patient had a PCN reaction causing immediate rash, facial/tongue/throat swelling, SOB or lightheadedness with hypotension: yes Has patient had a PCN reaction causing severe rash involving mucus membranes or skin necrosis:no Has patient had a PCN reaction that required hospitalization: yes Has patient had a PCN reaction occurring within the last 10 years: no If all of the above answers are "NO", then may proceed with Cephalosporin use. Tolerates rocephin/keflex   Sulfonamide Derivatives Other (See Comments)    Shaking all over, seizure like symptoms. Hospitalization resulted    Avapro [Irbesartan] Other (See Comments)    Pt associates avapro with bruising though aware scientifically not the case, wants to chanmge med   Levaquin [Levofloxacin] Itching    Patient Measurements: Height: 5\' 5"  (165.1 cm) Weight: 73.7 kg (162 lb 7.7 oz) IBW/kg (Calculated) : 57 Heparin Dosing Weight: 72 kg  Vital Signs: Temp: 97.5 F (36.4 C) (03/27 0700) Temp Source: Oral (03/27 0700) BP: 127/74 (03/27 0600) Pulse Rate: 72 (03/27 0600)  Labs: Recent Labs    04/09/22 0419 04/10/22 0420 04/11/22 0447  HGB 12.6 12.3 14.6  HCT 42.1 40.0 45.0  PLT 356 354 409*  HEPARINUNFRC 0.31 0.37 0.44  CREATININE 0.86 0.91 0.75     Estimated Creatinine Clearance: 59.2 mL/min (by C-G formula based on SCr of 0.75 mg/dL).  Assessment: 76 yo female with new onset Afib, pharmacy consulted for heparin management.   HL 0.44- therapeutic  CBC WNL  Goal of Therapy:  Heparin level 0.3-0.7 units/ml Monitor platelets by anticoagulation protocol: Yes   Plan:  Continue heparin infusion at 1100 units/hr Check anti-Xa level daily Monitor H&H and platelets.   Margot Ables, PharmD Clinical Pharmacist 04/11/2022 8:16 AM

## 2022-04-11 NOTE — Progress Notes (Signed)
PROGRESS NOTE    Patient: [name]                            PCP: [name], MD                    DOB: 1945-03-21            DOA: 04/06/2022 [id number]             DOS: 04/11/2022, 11:34 AM   LOS: 4 days   Date of Service: The patient was seen and examined on 04/11/2022  Subjective:   The patient was seen and examined this morning, much more awake, cooperative, not complain of chest pain, still complain of shortness of breath reporting patient had slept well last night.  Heart rate and blood pressure has improved  Still On amiodarone drip, on heparin drip,  Brief Narrative:   Brief Narrative:  [name] is a 77 y.o. female with past medical history relevant for COPD/asthma in the setting of ongoing tobacco use, as well as history of left breast cancer, diabetes type 2 and hypertension admitted on 04/07/2022 with acute hypoxic and hypercapnic respiratory failure in the setting of community-acquired pneumonia/fibrillation ablation as well as new onset atrial fibrillation and low EF.  Assessment and Plan:     1)Acute COPD exacerbation/tobacco abuse - -still having shortness of breath--but continue to improve-currently satting 97% on 2.5 L of oxygen  Repeating ABG: 7.4 3/48/76 Obtaining CT angiogram: Negative for pulmonary embolism, CAD, small to moderate left pleural effusion, patchy infiltrate in both lower lobes ?  Interstitial pneumonia    --Likely secondary to respiratory infection - -continue IV steroids bronchodilators, mucolytic's and Rocephin/doxycycline as ordered -Appreciate pulmonary -Dr. Melvyn Novas input input   2)Presumed CAP-- - POA: productive cough with colored sputum, low-grade fevers and leukocytosis --Chest x-ray suggestive of possible atypical/viral pneumonia,  -Procalcitonin 0.26 -COVID, RSV and influenza negative -Respiratory culture NGTD -Acute on chronic WBC is 19.6 >>> 12.0 >>> 10.9, 10.7   3)New onset A-fib with RVR- -still in  A-fib with RVR, heart rate 53-121 Currently heart rate is 72 -Still on amiodarone drip -Cardiology switching to amiodarone load 200 mg twice a day, continue heparin drip for now planning to switch to DOAC   -While in the ED patient is found to be in A-fib with RVR which is new for her -Troponin 50 >> 50 >> 17 -TSH WNL  - Tachycardia induced cardiomyopathy with low EF Versus ischemic cardiomyopathy -Continue IV amiodarone drip >> switch to p.o. -Continue IV heparin  -Discussed the case with Dr. Domenic Polite appreciate close follow-up and recommendations Recommended to continue Bisoprolo and PO amiodarone added Entresto 24/26 mg twice a day To continue Aldactone and IV Lasix, repleting potassium, checking magnesium    4) acute hypoxic and hypercapnic respiratory failure--- due to #1, #2 #3 above -Continue to improve currently on 2.5 L, satting 97%  --POA - in the ED patient is found to be hypoxic with O2 sats of 84% on room air and 90% on 2 L with productive   -Hypoxia persisted -Patient developed significant lethargy/metabolic encephalopathy/unresponsiveness-able--G consistent with uncompensated respiratory acidosis due to hypercapnia with a pH of 7.18 and pCO2 of 82 -Initiated on BiPAP on 04/08/2022 -Repeating ABG, obtaining CTA of the chest today reviewed negative for pulmonary embolism     5)HFrEF-----acute systolic dysfunction CHF- Filed Weights   04/06/22 1302 04/07/22 1104 04/08/22 0500  Weight: 72.6 kg 73.7 kg 73.7 kg    Intake/Output Summary (Last 24 hours) at 04/11/2022 1125 Last data filed at 04/11/2022 0941 Gross per 24 hour  Intake 1618.81 ml  Output 2700 ml  Net -1081.19 ml  continue Bisoprolo and IV amiodarone added Entresto 24/26 mg twice daily today 04/10/2022 To continue Aldactone and IV Lasix, repleting potassium, checking magnesiu   --Troponin 50 >> 50 >> 17 Echo with EF of 35 to 40% with regional wall motion abnormalities--The inferior wall, mid anteroseptal  segment, mid inferoseptal segment, and basal inferoseptal segment are hypokinetic  --Tachycardia induced cardiomyopathy with low EF Versus ischemic cardiomyopathy -Per cardiology recommendation continue Bisoprolol, spironolactone 50 mg daily, aspirin, Entresto, Aldactone, Crestor, IV Lasix Monitoring BUN/creatinine closely  -Appreciate follow-up with cardiology with recommendations    6)HTN--continue Bisoprolo and PO amiodarone added Entresto 24/26 mg twice daily today 04/10/2022 To continue Aldactone and IV Lasix,    7)DM2-recent A1c 6.3% reflecting excellent diabetic control PTA -Anticipate worsening glycemic control while on steroids for #1 above Use Novolog/Humalog Sliding scale insulin with Accu-Cheks/Fingersticks as ordered    8)Social/Ethics--- called patient's daughter [name] and granddaughter [name] who is patient's primary caregiver-full scope of care, comorbidities, and steep decline the patient has been discussed in detail -Patient is a full code without limitations to treatment at this point   Willing to discussed with the palliative care team--in anticipation of changing CODE STATUS, and clarification on goals of care  At this point patient's prognosis is poor due to acute respiratory failure due to pneumonia, and A-fib with RVR, labile vitals, significantly reduced heart failure new onset. Will call and discussed with the patient's daughter again  Consulting palliative care team   ------------------------------------------------------------------------------------------------------------------------- Nutritional status:  The patient's BMI is: Body mass index is 27.04 kg/m. I agree with the assessment and plan as outlined below: Nutrition Status:      ------------------------------------------------------------------------------------------------------------------------ Cultures; Blood Cultures x 2 >> NGT Sputum Culture >> NGT     -------------------------------------------------------------------------------------------------------------------------  Consults: PCCM/cardiology/palliative care  DVT prophylaxis:  SCDs Start: 04/06/22 1655 Place TED hose Start: 04/06/22 1655   Code Status:   Code Status: Full Code  Family Communication: No family member present at bedside- attempt will be made to update daily POA daughter [name] at [phone] The above findings and plan of care has been discussed with patient (and family)  in detail,  they expressed understanding and agreement of above. -Advance care planning has been discussed.   Admission status:   Status is: Inpatient Remains inpatient appropriate because: Needing close monitoring and trending in intensive care unit for acute respiratory failure, A-fib with RVR   Disposition: From  - home             Planning for discharge in 1-2 days: to   Procedures:   No admission procedures for hospital encounter.   Antimicrobials:  Anti-infectives (From admission, onward)    Start     Dose/Rate Route Frequency Ordered Stop   04/07/22 1400  cefTRIAXone (ROCEPHIN) 2 g in sodium chloride 0.9 % 100 mL IVPB        2 g 200 mL/hr over 30 Minutes Intravenous Every 24 hours 04/06/22 1421     04/07/22 1400  azithromycin (ZITHROMAX) 500 mg in sodium chloride 0.9 % 250 mL IVPB  Status:  Discontinued        500 mg 250 mL/hr over 60 Minutes Intravenous Every 24 hours 04/06/22 1421 04/06/22 1516   04/07/22 1400  doxycycline (  VIBRAMYCIN) 100 mg in sodium chloride 0.9 % 250 mL IVPB        100 mg 125 mL/hr over 120 Minutes Intravenous Every 12 hours 04/06/22 1514     04/06/22 1330  levofloxacin (LEVAQUIN) IVPB 750 mg        750 mg 100 mL/hr over 90 Minutes Intravenous  Once 04/06/22 1328 04/06/22 1438        Medication:   acidophilus  2 capsule Oral TID   amiodarone  200 mg Oral BID   aspirin EC  81 mg Oral Q breakfast   bisoprolol  5 mg Oral Daily    calcium-vitamin D  1 tablet Oral BID   Chlorhexidine Gluconate Cloth  6 each Topical Daily   cyanocobalamin  500 mcg Oral Daily   cyclobenzaprine  10 mg Oral QHS   furosemide  40 mg Intravenous Daily   guaiFENesin  1,200 mg Oral BID   ipratropium-albuterol  3 mL Nebulization TID   methylPREDNISolone (SOLU-MEDROL) injection  40 mg Intravenous Q12H   mometasone-formoterol  2 puff Inhalation BID   polyethylene glycol  17 g Oral Daily   potassium chloride  40 mEq Oral Daily   rosuvastatin  10 mg Oral Daily   sacubitril-valsartan  1 tablet Oral BID   sodium chloride flush  3 mL Intravenous Q12H   spironolactone  25 mg Oral Daily    acetaminophen **OR** acetaminophen, albuterol, bisacodyl, labetalol, levalbuterol, ondansetron **OR** ondansetron (ZOFRAN) IV, mouth rinse, pantoprazole, polyethylene glycol, traMADol, traZODone   Objective:   Vitals:   04/11/22 0500 04/11/22 0600 04/11/22 0700 04/11/22 0739  BP: (!) 147/58 127/74    Pulse: (!) 53 72    Resp: (!) 21 16    Temp:   (!) 97.5 F (36.4 C)   TempSrc:   Oral   SpO2: 95% 97%  97%  Weight:      Height:        Intake/Output Summary (Last 24 hours) at 04/11/2022 1134 Last data filed at 04/11/2022 0941 Gross per 24 hour  Intake 1618.81 ml  Output 2700 ml  Net -1081.19 ml   Filed Weights   04/06/22 1302 04/07/22 1104 04/08/22 0500  Weight: 72.6 kg 73.7 kg 73.7 kg     Physical examination:    General:  More alert today, following command, still complain of shortness of breath, stating   HEENT:  Normocephalic, PERRL, otherwise with in Normal limits   Neuro:  CNII-XII intact. , normal motor and sensation, reflexes intact   Lungs:   Clear to auscultation BL, Respirations unlabored,  No wheezes / crackles  Cardio:    Irregularly irregular no murmure, No Rubs or Gallops   Abdomen:  Soft, non-tender, bowel sounds active all four quadrants, no guarding or peritoneal signs.  Muscular  skeletal:  Limited exam - sever global  generalized weaknesses - in bed, able to move all 4 extremities,   2+ pulses,  symmetric, No pitting edema  Skin:  Dry, warm to touch, negative for any Rashes,  Wounds: Please see nursing documentation          ------------------------------------------------------------------------------------------------------------------------------------------    LABs:     Latest Ref Rng & Units 04/11/2022    4:47 AM 04/10/2022    4:20 AM 04/09/2022    4:19 AM  CBC  WBC 4.0 - 10.5 K/uL 10.7  10.9  12.0   Hemoglobin 12.0 - 15.0 g/dL 14.6  12.3  12.6   Hematocrit 36.0 - 46.0 % 45.0  40.0  42.1   Platelets 150 - 400 K/uL 409  354  356       Latest Ref Rng & Units 04/11/2022    4:47 AM 04/10/2022    4:20 AM 04/09/2022    4:19 AM  CMP  Glucose 70 - 99 mg/dL 133  151  114   BUN 8 - 23 mg/dL 29  35  35   Creatinine 0.44 - 1.00 mg/dL 0.75  0.91  0.86   Sodium 135 - 145 mmol/L 139  141  143   Potassium 3.5 - 5.1 mmol/L 3.0  3.7  3.7   Chloride 98 - 111 mmol/L 100  107  108   CO2 22 - 32 mmol/L 29  23  25    Calcium 8.9 - 10.3 mg/dL 9.7  9.1  10.0        Micro Results Recent Results (from the past 240 hour(s))  Blood Culture (routine x 2)     Status: None (Preliminary result)   Collection Time: 04/06/22  1:50 PM   Specimen: BLOOD  Result Value Ref Range Status   Specimen Description BLOOD RIGHT ANTECUBITAL  Final   Special Requests   Final    BOTTLES DRAWN AEROBIC AND ANAEROBIC Blood Culture adequate volume   Culture   Final    NO GROWTH 4 DAYS Performed at [facility], [address], [place], [place] [address]    Report Status PENDING  Incomplete  Blood Culture (routine x 2)     Status: None (Preliminary result)   Collection Time: 04/06/22  1:50 PM   Specimen: BLOOD  Result Value Ref Range Status   Specimen Description BLOOD LEFT ANTECUBITAL  Final   Special Requests   Final    BOTTLES DRAWN AEROBIC AND ANAEROBIC Blood Culture adequate volume   Culture   Final    NO GROWTH 4  DAYS Performed at [facility], [address] [place], [place] [address]    Report Status PENDING  Incomplete  Resp panel by RT-PCR (RSV, Flu A&B, Covid) Anterior Nasal Swab     Status: None   Collection Time: 04/06/22  2:09 PM   Specimen: Anterior Nasal Swab  Result Value Ref Range Status   SARS Coronavirus 2 by RT PCR NEGATIVE NEGATIVE Final    Comment: (NOTE) SARS-CoV-2 target nucleic acids are NOT DETECTED.  The SARS-CoV-2 RNA is generally detectable in upper respiratory specimens during the acute phase of infection. The lowest concentration of SARS-CoV-2 viral copies this assay can detect is 138 copies/mL. A negative result does not preclude SARS-Cov-2 infection and should not be used as the sole basis for treatment or other patient management decisions. A negative result may occur with  improper specimen collection/handling, submission of specimen other than nasopharyngeal swab, presence of viral mutation(s) within the areas targeted by this assay, and inadequate number of viral copies(<138 copies/mL). A negative result must be combined with clinical observations, patient history, and epidemiological information. The expected result is Negative.  Fact Sheet for Patients:  EntrepreneurPulse.com.au  Fact Sheet for Healthcare Providers:  IncredibleEmployment.be  This test is no t yet approved or cleared by the [place] FDA and  has been authorized for detection and/or diagnosis of SARS-CoV-2 by FDA under an Emergency Use Authorization (EUA). This EUA will remain  in effect (meaning this test can be used) for the duration of the COVID-19 declaration under Section 564(b)(1) of the Act, 21 U.S.C.section 360bbb-3(b)(1), unless the authorization is terminated  or revoked sooner.  Influenza A by PCR NEGATIVE NEGATIVE Final   Influenza B by PCR NEGATIVE NEGATIVE Final    Comment: (NOTE) The Xpert Xpress SARS-CoV-2/FLU/RSV plus  assay is intended as an aid in the diagnosis of influenza from Nasopharyngeal swab specimens and should not be used as a sole basis for treatment. Nasal washings and aspirates are unacceptable for Xpert Xpress SARS-CoV-2/FLU/RSV testing.  Fact Sheet for Patients: EntrepreneurPulse.com.au  Fact Sheet for Healthcare Providers: IncredibleEmployment.be  This test is not yet approved or cleared by the [place] FDA and has been authorized for detection and/or diagnosis of SARS-CoV-2 by FDA under an Emergency Use Authorization (EUA). This EUA will remain in effect (meaning this test can be used) for the duration of the COVID-19 declaration under Section 564(b)(1) of the Act, 21 U.S.C. section 360bbb-3(b)(1), unless the authorization is terminated or revoked.     Resp Syncytial Virus by PCR NEGATIVE NEGATIVE Final    Comment: (NOTE) Fact Sheet for Patients: EntrepreneurPulse.com.au  Fact Sheet for Healthcare Providers: IncredibleEmployment.be  This test is not yet approved or cleared by the [place] FDA and has been authorized for detection and/or diagnosis of SARS-CoV-2 by FDA under an Emergency Use Authorization (EUA). This EUA will remain in effect (meaning this test can be used) for the duration of the COVID-19 declaration under Section 564(b)(1) of the Act, 21 U.S.C. section 360bbb-3(b)(1), unless the authorization is terminated or revoked.  Performed at [facility], [address], [place], [place] [address]   Urine Culture     Status: None   Collection Time: 04/06/22  7:58 PM   Specimen: Urine, Random  Result Value Ref Range Status   Specimen Description   Final    URINE, RANDOM Performed at [facility], [address], [place], [place] [address]    Special Requests URINE, CLEAN CATCH  Final   Culture   Final    NO GROWTH Performed at [facility], [place] [address] [place],  [place] [address]    Report Status 04/08/2022 FINAL  Final  Expectorated Sputum Assessment w Gram Stain, Rflx to Resp Cult     Status: None   Collection Time: 04/06/22  9:19 PM   Specimen: Sputum  Result Value Ref Range Status   Specimen Description SPUTUM  Final   Special Requests NONE  Final   Sputum evaluation   Final    THIS SPECIMEN IS ACCEPTABLE FOR SPUTUM CULTURE PERORMED AT [facility] Performed at [facility], [address], [place], [place] [address]    Report Status 04/06/2022 FINAL  Final  Culture, Respiratory w Gram Stain     Status: None   Collection Time: 04/06/22  9:19 PM   Specimen: SPU  Result Value Ref Range Status   Specimen Description   Final    SPUTUM Performed at [facility], [address] [place], [place] [address]    Special Requests   Final    NONE Reflexed from [id number] Performed at [facility], [address], [place], [place] [address]    Gram Stain   Final    MODERATE GRAM POSITIVE COCCI FEW GRAM NEGATIVE RODS RARE SQUAMOUS EPITHELIAL CELLS PRESENT RARE WBC PRESENT, PREDOMINANTLY PMN    Culture   Final    MODERATE Normal respiratory flora-no Staph aureus or Pseudomonas seen Performed at [facility], [place] [address], [place], [place] [address]    Report Status 04/09/2022 FINAL  Final  MRSA Next Gen by PCR, Nasal     Status: None  Collection Time: 04/07/22 12:22 PM   Specimen: Nasal Mucosa; Nasal Swab  Result Value Ref Range Status   MRSA by PCR Next Gen NOT DETECTED NOT DETECTED Final    Comment: (NOTE) The GeneXpert MRSA Assay (FDA approved for NASAL specimens only), is one component of a comprehensive MRSA colonization surveillance program. It is not intended to diagnose MRSA infection nor to guide or monitor treatment for MRSA infections. Test performance is not FDA approved in patients less than 32 years old. Performed at [facility], [address], [place], [place] [address]     Radiology Reports CT Angio Chest Pulmonary Embolism (PE) W  or WO Contrast  Result Date: 04/10/2022 CLINICAL DATA:  Shortness of breath, clinical suspicion for pulmonary embolism EXAM: CT ANGIOGRAPHY CHEST WITH CONTRAST TECHNIQUE: Multidetector CT imaging of the chest was performed using the standard protocol during bolus administration of intravenous contrast. Multiplanar CT image reconstructions and MIPs were obtained to evaluate the vascular anatomy. RADIATION DOSE REDUCTION: This exam was performed according to the departmental dose-optimization program which includes automated exposure control, adjustment of the mA and/or kV according to patient size and/or use of iterative reconstruction technique. CONTRAST:  61mL OMNIPAQUE IOHEXOL 350 MG/ML SOLN COMPARISON:  Noncontrast CT chest done on 03/23/2022, chest radiograph done on 04/08/2022 FINDINGS: Cardiovascular: There are no intraluminal filling defects in pulmonary artery branches. Evaluation of small subsegmental branches in the lower lung fields is limited by infiltrates. Coronary artery calcifications are seen. There is homogeneous enhancement in the lumen of thoracic aorta. Scattered calcifications are seen in thoracic aorta and its major branches. Mediastinum/Nodes: Subcentimeter nodes are seen in mediastinum and hilar regions. Lungs/Pleura: Small to moderate bilateral pleural effusions are seen, more so on the left side. There are patchy infiltrates in both lower lobes. There are small scattered patchy infiltrates in both upper lobes. There is a pleural thickening in the anterolateral aspect of left upper lung field. There is no pneumothorax. Upper Abdomen: Surgical clips are seen in gallbladder fossa. Low-density in liver close to falciform ligament may suggest aberrant venous drainage. Musculoskeletal: No acute findings are seen. Review of the MIP images confirms the above findings. IMPRESSION: There is no evidence of pulmonary artery embolism. There is no evidence of thoracic aortic dissection. Coronary  artery calcifications are seen. Small to moderate bilateral pleural effusions, more so on the left side. There are patchy infiltrates in both lower lobes suggesting atelectasis/pneumonia. There are small scattered patchy infiltrates in both upper lobes, possibly suggesting foci of interstitial pneumonia. There is pleural thickening in the anterolateral aspect of left upper lung field which may be residual from previous intervention or previous trauma. Electronically Signed   By: Elmer Picker M.D.   On: 04/10/2022 15:00    SIGNED: Deatra James, MD, FHM. FAAFP. [facility] - [facility] Time spent > 77 min.  Of critical care time was spent in seeing, evaluating and examining the patient. Reviewing medical records, labs, drawn plan of care. [facility],  Pager (please use [email or web address] to page/ text) Please use Epic Secure Chat for non-urgent communication (7AM-7PM)  If 7PM-7AM, please contact night-coverage [email or web address], 04/11/2022, 11:34 AM

## 2022-04-11 NOTE — Consult Note (Signed)
Consultation Note Date: 04/11/2022   Patient Name: [name]  DOB: Aug 27, 1945  MRN: [id number]  Age / Sex: 77 y.o., female  PCP: [name], MD Referring Physician: Deatra James, MD  Reason for Consultation: Establishing goals of care  HPI/Patient Profile: 77 y.o. female  with past medical history of COPD/asthma with ongoing nicotine dependence, left breast cancer stage I 2007, DM, diverticula of colon, HTN/HLD, kidney stones, osteoarthritis, admitted on 04/06/2022 with acute COPD exacerbation/tobacco abuse, presumed.   Clinical Assessment and Goals of Care: I have reviewed medical records including EPIC notes, labs and imaging, received report from RN, assessed the patient.  [name] is sitting up in bed.  She appears acutely/chronically ill and somewhat frail with some work of breathing noted.  She greets me, making and somewhat keeping eye contact.  She is alert and oriented x 3, able to make her needs known.  There is no family at bedside at this time.  We meet at the bedside to discuss diagnosis prognosis, GOC, EOL wishes, disposition and options.  I introduced Palliative Medicine as specialized medical care for people living with serious illness. It focuses on providing relief from the symptoms and stress of a serious illness. The goal is to improve quality of life for both the patient and the family.  We discussed a brief life review of the patient.  [name] tells me that she is a widow.  She has lived in her daughter Patricia's home for approximately 5 years since her husband died.  She has help from her granddaughter [name] with ADLs when needed.  She tells me that she has a son who is not involved in her life.  We then focused on their current illness.  We talk about her respiratory issues and the treatment plan.  We talk about time for outcomes.  [name] tells me that she is very  weak.  We talked about short-term rehab if qualified.  She states that she would consider rehab but of course would prefer to return home.  The natural disease trajectory and expectations at EOL were discussed.  Advanced directives, concepts specific to code status, artifical feeding and hydration, and rehospitalization were considered and discussed.  We talk about the concept of "treat the treatable, but allowing natural passing".  I encourage [name] to consider what she does and does not want.  I reassured [name] that these questions are asked out of respect for her, not because she is dying.  Discussed the importance of continued conversation with family and the medical providers regarding overall plan of care and treatment options, ensuring decisions are within the context of the patient's values and GOCs.  Questions and concerns were addressed.  The patient was encouraged to call with questions or concerns.  PMT will continue to support holistically.  Conference with attending, bedside nursing staff, transition of care team related to patient condition, needs, goals of care, disposition.   HCPOA  NEXT OF KIN -daughter, [name].  [name]  tells me that she is a widow.  She has a son who is not involved in her life.    SUMMARY OF RECOMMENDATIONS   At this point continue full scope/full code.   Time for outcomes Considering short-term rehab if needed but would prefer to return home   Code Status/Advance Care Planning: Full code - We talk about the concept of "treat the treatable, but allowing natural passing".  I encourage [name] to consider what she does and does not want.  Symptom Management:  Per hospitalist, no additional needs at this time.  Palliative Prophylaxis:  Frequent Pain Assessment, Oral Care, and Turn Reposition  Additional Recommendations (Limitations, Scope, Preferences): Full Scope Treatment  Psycho-social/Spiritual:  Desire for further Chaplaincy  support:no Additional Recommendations: Caregiving  Support/Resources and ICU Family Guide  Prognosis:  Unable to determine, based on outcomes.  Guarded at this point.  6 months or less would not be surprising based on chronic illness burden, decreasing functional status.  Discharge Planning: To Be Determined      Primary Diagnoses: Present on Admission:  Acute respiratory failure with hypoxia (HCC)  Asthmatic bronchitis , chronic  Cigarette smoker  COPD (chronic obstructive pulmonary disease) with chronic bronchitis (HCC)  HTN (hypertension)  New onset a-fib (HCC)  COPD with acute exacerbation (HCC)  Acute HFrEF/systolic dysfunction CHF with EF 35 to 40% and regional wall motion normalities 6   I have reviewed the medical record, interviewed the patient and family, and examined the patient. The following aspects are pertinent.  Past Medical History:  Diagnosis Date   Aortic atherosclerosis ([place])    Breast cancer ([place]) 2007   Stage I (T1b N0 M0), grade 1 well-differentiated carcinoma of the left breast status, post lumpectomy followed by radiation therapy. Her estrogen receptor receptors were 93%, progesterone receptors 67%. HER-2/neu was negative. No lymphovascular space invasion was seen. All margins were clear. Ki-67 marker was low at 1% with surgery on 11/15/2004. Treated then with post-lumpectomy radiation, finish   Breast cancer, left breast ([place]) 2007   COPD (chronic obstructive pulmonary disease) (HCC)    Coronary artery calcification seen on CAT scan    Diabetes mellitus without complication (HCC)    diet controlled   Diverticula of colon    Hx of rickettsial disease    Hyperlipidemia    Hypertension    Kidney stones    Nicotine dependence    Osteoarthritis    Social History   Socioeconomic History   Marital status: Single    Spouse name: Not on file   Number of children: 5   Years of education: 9th grade    Highest education level: 9th grade  Occupational  History   Occupation: retired    Fish farm manager: RETIRED  Tobacco Use   Smoking status: Every Day    Packs/day: 0.50    Years: 50.00    Additional pack years: 0.00    Total pack years: 25.00    Types: Cigarettes   Smokeless tobacco: Never   Tobacco comments:    smokes 1/2 pack per day 11/19/2019  Vaping Use   Vaping Use: Never used  Substance and Sexual Activity   Alcohol use: No    Alcohol/week: 0.0 standard drinks of alcohol   Drug use: No   Sexual activity: Not Currently  Other Topics Concern   Not on file  Social History Narrative   ** Merged History Encounter **       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  MARRIED WITH A RUN AWAY HUSBAND(FUGITIVE 10 YRS). 3 MISCARRIAGES, 5 CHILDREN   Social Determinants of Health   Financial Resource Strain: Low Risk  (02/19/2022)   Overall Financial Resource Strain (CARDIA)    Difficulty of Paying Living Expenses: Not hard at all  Food Insecurity: No Food Insecurity (04/07/2022)   Hunger Vital Sign    Worried About Running Out of Food in the Last Year: Never true    Ran Out of Food in the Last Year: Never true  Transportation Needs: No Transportation Needs (04/07/2022)   PRAPARE - Hydrologist (Medical): No    Lack of Transportation (Non-Medical): No  Physical Activity: Insufficiently Active (02/19/2022)   Exercise Vital Sign    Days of Exercise per Week: 5 days    Minutes of Exercise per Session: 20 min  Stress: No Stress Concern Present (02/19/2022)   [place]    Feeling of Stress : Not at all  Social Connections: Socially Isolated (02/19/2022)   Social Connection and Isolation Panel [NHANES]    Frequency of Communication with Friends and Family: More than three times a week    Frequency of Social Gatherings with Friends and Family: More than three times a week    Attends  Religious Services: Never    Marine scientist or Organizations: No    Attends Archivist Meetings: Never    Marital Status: Widowed   Family History  Problem Relation Age of Onset   Breast cancer Mother    COPD Father    Emphysema Father    Throat cancer Sister    Glaucoma Brother    Schizophrenia Brother    Heart attack Brother    Lung cancer Sister        former smoker   Hepatitis C Daughter    Seizures Daughter    SIDS Son    Colon cancer Neg Hx    Colon polyps Neg Hx    Scheduled Meds:  acidophilus  2 capsule Oral TID   amiodarone  200 mg Oral BID   aspirin EC  81 mg Oral Q breakfast   bisoprolol  5 mg Oral Daily   calcium-vitamin D  1 tablet Oral BID   Chlorhexidine Gluconate Cloth  6 each Topical Daily   cyanocobalamin  500 mcg Oral Daily   cyclobenzaprine  10 mg Oral QHS   furosemide  40 mg Intravenous Daily   guaiFENesin  1,200 mg Oral BID   ipratropium-albuterol  3 mL Nebulization TID   methylPREDNISolone (SOLU-MEDROL) injection  40 mg Intravenous Q12H   mometasone-formoterol  2 puff Inhalation BID   polyethylene glycol  17 g Oral Daily   potassium chloride  40 mEq Oral Daily   rosuvastatin  10 mg Oral Daily   sacubitril-valsartan  1 tablet Oral BID   sodium chloride flush  3 mL Intravenous Q12H   spironolactone  25 mg Oral Daily   Continuous Infusions:  cefTRIAXone (ROCEPHIN)  IV Stopped (04/10/22 1347)   doxycycline (VIBRAMYCIN) IV 125 mL/hr at 04/11/22 0941   heparin 1,100 Units/hr (04/11/22 1348)   PRN Meds:.acetaminophen **OR** acetaminophen, albuterol, bisacodyl, labetalol, levalbuterol, ondansetron **OR** ondansetron (ZOFRAN) IV, mouth rinse, pantoprazole, polyethylene glycol, traMADol, traZODone Medications Prior to Admission:  Prior to Admission medications   Medication Sig Start Date End Date Taking? Authorizing Provider  acetaminophen (TYLENOL) 325 MG tablet Take 650 mg by mouth every 6 (  six) hours as needed for moderate pain.     Yes [provider]  albuterol (PROVENTIL) (2.5 MG/3ML) 0.083% nebulizer solution INHALE ONE VIAL VIA NEBULIZER THREE TIMES DAILY. Patient taking differently: Take 2.5 mg by nebulization 3 (three) times daily as needed for wheezing or shortness of breath. 07/08/18  Yes Perlie Mayo, NP  albuterol (VENTOLIN HFA) 108 (90 Base) MCG/ACT inhaler INHALE (2) PUFFS EVERY FOURHOURS AS NEEDED ONLY IF YOU CANT CATCH YOUR BREATH Patient taking differently: Inhale 2 puffs into the lungs every 4 (four) hours as needed for wheezing or shortness of breath. INHALE (2) PUFFS EVERY FOURHOURS AS NEEDED ONLY IF YOU CANT CATCH YOUR BREATH 05/12/21  Yes Lindell Spar, MD  amLODipine (NORVASC) 5 MG tablet TAKE (1) TABLET BY MOUOTH ONCE DAILY. Patient taking differently: Take 5 mg by mouth daily. TAKE (1) TABLET BY MOUOTH ONCE DAILY. 02/21/22  Yes [name], MD  aspirin EC 81 MG tablet Take 81 mg by mouth daily.   Yes [provider]  ATROVENT HFA 17 MCG/ACT inhaler INHALE 2 PUFFS BY MOUTH FOUR TIMES A DAY. Patient taking differently: Inhale 2 puffs into the lungs in the morning, at noon, in the evening, and at bedtime. INHALE 2 PUFFS BY MOUTH FOUR TIMES A DAY. 03/20/22  Yes [name], MD  Calcium Carbonate-Vitamin D (CALCIUM 600 + D PO) Take 1 tablet by mouth 2 (two) times daily.   Yes [provider]  Cyanocobalamin (VITAMIN B 12) 500 MCG TABS Take 500 mcg by mouth daily. Take one tablet by mouth once daily 08/18/20  Yes [name], MD  cyclobenzaprine (FLEXERIL) 10 MG tablet Take 1 tablet (10 mg total) by mouth at bedtime. One tablet every night at bedtime as needed for spasm. 04/03/22  Yes Sanjuana Kava, MD  fluticasone (FLONASE) 50 MCG/ACT nasal spray PLACE 2 SPRAYS IN EACH NOSTRIL DAILY. Patient taking differently: Place 2 sprays into both nostrils daily. 03/20/22  Yes [name], MD  montelukast (SINGULAIR) 10 MG tablet TAKE 1 TABLET BY MOUTH AT BEDTIME.  12/18/21  Yes [name], MD  Naphazoline HCl (CLEAR EYES OP) Place 1 drop into both eyes daily as needed (irritation).   Yes [provider]  nicotine (NICODERM CQ - DOSED IN MG/24 HR) 7 mg/24hr patch Place 1 patch (7 mg total) onto the skin daily. 01/18/22  Yes [name], MD  pantoprazole (PROTONIX) 40 MG tablet TAKE ONE TABLET BY MOUTH ONCE DAILY BEFORE BREAKFAST. Patient taking differently: Take 40 mg by mouth daily as needed (acid reflux). 12/15/21  Yes Tanda Rockers, MD  polyethylene glycol powder (GLYCOLAX/MIRALAX) powder MIX 1 CAPFUL IN 8 OUNCES OF JUICE OR WATER AND DRINK ONCE DAILY. Patient taking differently: Take 17 g by mouth daily. 04/23/16  Yes [name], MD  Potassium 99 MG TABS Take 99 mg by mouth daily.   Yes [provider]  rosuvastatin (CRESTOR) 10 MG tablet TAKE 1 TABLET BY MOUTH ONCE A DAY. Patient taking differently: Take 10 mg by mouth daily. 12/18/21  Yes [name], MD  spironolactone (ALDACTONE) 50 MG tablet TAKE 1 TABLET BY MOUTH ONCE A DAY. Patient taking differently: Take 50 mg by mouth daily. 02/16/22  Yes [name], MD  SYMBICORT 160-4.5 MCG/ACT inhaler INHALE 2 PUFFS INTO THE LUNGS TWICE DAILY. Patient taking differently: Inhale 2 puffs into the lungs 2 (two) times daily. 03/20/22  Yes [name], MD  traMADol Veatrice Bourbon) 50  MG tablet One tablet every eight hours as needed for pain. Patient taking differently: Take 50 mg by mouth every 8 (eight) hours as needed for moderate pain or severe pain. One tablet every eight hours as needed for pain. 04/03/22  Yes Sanjuana Kava, MD  benzonatate (TESSALON) 100 MG capsule Take 1 capsule (100 mg total) by mouth 2 (two) times daily as needed for cough. Patient not taking: Reported on 04/06/2022 01/18/22   [name], MD  diclofenac Sodium (VOLTAREN) 1 % GEL Apply 2 g topically 4 (four) times daily. Patient not taking: Reported on 04/06/2022 11/13/21    Fransico Meadow, MD  erythromycin base (E-MYCIN) 500 MG tablet Take 1 tablet (500 mg total) by mouth 3 (three) times daily. Patient not taking: Reported on 04/06/2022 01/18/22   [name], MD  Misc. Devices MISC Please provide patient with mastectomy bra and prosthesis. Dx: History of left breast cancer 08/25/20   Derek Jack, MD   Allergies  Allergen Reactions   Penicillins Hives, Shortness Of Breath and Swelling    Has patient had a PCN reaction causing immediate rash, facial/tongue/throat swelling, SOB or lightheadedness with hypotension: yes Has patient had a PCN reaction causing severe rash involving mucus membranes or skin necrosis:no Has patient had a PCN reaction that required hospitalization: yes Has patient had a PCN reaction occurring within the last 10 years: no If all of the above answers are "NO", then may proceed with Cephalosporin use. Tolerates rocephin/keflex   Sulfonamide Derivatives Other (See Comments)    Shaking all over, seizure like symptoms. Hospitalization resulted    Avapro [Irbesartan] Other (See Comments)    Pt associates avapro with bruising though aware scientifically not the case, wants to chanmge med   Levaquin [Levofloxacin] Itching   Review of Systems  Unable to perform ROS: Other    Physical Exam Vitals and nursing note reviewed.  Constitutional:      General: She is not in acute distress.    Appearance: She is ill-appearing.  Cardiovascular:     Rate and Rhythm: Normal rate.  Pulmonary:     Effort: Pulmonary effort is normal. Tachypnea present.  Skin:    General: Skin is warm and dry.  Neurological:     Mental Status: She is alert and oriented to person, place, and time.  Psychiatric:        Mood and Affect: Mood normal.        Behavior: Behavior normal.     Vital Signs: BP 128/68   Pulse 82   Temp (!) 97.5 F (36.4 C) (Oral)   Resp (!) 21   Ht 5\' 5"  (1.651 m)   Wt 73.7 kg   SpO2 96%   BMI 27.04 kg/m  Pain  Scale: 0-10 POSS *See Group Information*: 1-Acceptable,Awake and alert Pain Score: 0-No pain   SpO2: SpO2: 96 % O2 Device:SpO2: 96 % O2 Flow Rate: .O2 Flow Rate (L/min): 2.5 L/min  IO: Intake/output summary:  Intake/Output Summary (Last 24 hours) at 04/11/2022 1355 Last data filed at 04/11/2022 0941 Gross per 24 hour  Intake 1618.81 ml  Output 1700 ml  Net -81.19 ml    LBM: Last BM Date : 04/06/22 Baseline Weight: Weight: 72.6 kg Most recent weight: Weight: 73.7 kg     Palliative Assessment/Data:     Time In: 0930 Time Out: 1025 Time Total: 55 minutes  Greater than 50%  of this time was spent counseling and coordinating care related to the above assessment and  plan.  Signed by: Drue Novel, NP   Please contact Palliative Medicine Team phone at [phone] for questions and concerns.  For individual provider: See Shea Evans

## 2022-04-12 ENCOUNTER — Telehealth: Payer: Self-pay | Admitting: Family Medicine

## 2022-04-12 ENCOUNTER — Other Ambulatory Visit (HOSPITAL_COMMUNITY): Payer: Self-pay

## 2022-04-12 DIAGNOSIS — Z515 Encounter for palliative care: Secondary | ICD-10-CM | POA: Diagnosis not present

## 2022-04-12 DIAGNOSIS — J9601 Acute respiratory failure with hypoxia: Secondary | ICD-10-CM | POA: Diagnosis not present

## 2022-04-12 DIAGNOSIS — I5020 Unspecified systolic (congestive) heart failure: Secondary | ICD-10-CM | POA: Diagnosis not present

## 2022-04-12 DIAGNOSIS — Z7189 Other specified counseling: Secondary | ICD-10-CM | POA: Diagnosis not present

## 2022-04-12 LAB — BASIC METABOLIC PANEL
Anion gap: 9 (ref 5–15)
BUN: 33 mg/dL — ABNORMAL HIGH (ref 8–23)
CO2: 29 mmol/L (ref 22–32)
Calcium: 9.6 mg/dL (ref 8.9–10.3)
Chloride: 99 mmol/L (ref 98–111)
Creatinine, Ser: 0.75 mg/dL (ref 0.44–1.00)
GFR, Estimated: >60 mL/min (ref >60)
Glucose, Bld: 150 mg/dL — ABNORMAL HIGH (ref 70–99)
Potassium: 3.7 mmol/L (ref 3.5–5.1)
Sodium: 137 mmol/L (ref 135–145)

## 2022-04-12 LAB — CBC
HCT: 45.9 % (ref 36.0–46.0)
Hemoglobin: 14.8 g/dL (ref 12.0–15.0)
MCH: 28.6 pg (ref 26.0–34.0)
MCHC: 32.2 g/dL (ref 30.0–36.0)
MCV: 88.8 fL (ref 80.0–100.0)
Platelets: 408 10*3/uL — ABNORMAL HIGH (ref 150–400)
RBC: 5.17 MIL/uL — ABNORMAL HIGH (ref 3.87–5.11)
RDW: 14.2 % (ref 11.5–15.5)
WBC: 14.9 10*3/uL — ABNORMAL HIGH (ref 4.0–10.5)
nRBC: 0 % (ref 0.0–0.2)

## 2022-04-12 LAB — PROCALCITONIN: Procalcitonin: <0.1 ng/mL

## 2022-04-12 LAB — BRAIN NATRIURETIC PEPTIDE: B Natriuretic Peptide: 282 pg/mL — ABNORMAL HIGH (ref 0.0–100.0)

## 2022-04-12 LAB — GLUCOSE, CAPILLARY
Glucose-Capillary: 290 mg/dL — ABNORMAL HIGH (ref 70–99)
Glucose-Capillary: 172 mg/dL — ABNORMAL HIGH (ref 70–99)

## 2022-04-12 LAB — HEPARIN LEVEL (UNFRACTIONATED): Heparin Unfractionated: 0.35 IU/mL (ref 0.30–0.70)

## 2022-04-12 MED ORDER — DAPAGLIFLOZIN PROPANEDIOL 10 MG PO TABS
10.0000 mg | ORAL_TABLET | Freq: Every day | ORAL | Status: DC
  Administered 2022-04-12 – 2022-04-17 (×6): 10 mg via ORAL
  Filled 2022-04-12 (×6): qty 1

## 2022-04-12 MED ORDER — POTASSIUM CHLORIDE CRYS ER 20 MEQ PO TBCR
20.0000 meq | EXTENDED_RELEASE_TABLET | Freq: Every day | ORAL | Status: DC
  Administered 2022-04-12 – 2022-04-13 (×2): 20 meq via ORAL
  Filled 2022-04-12 (×2): qty 1

## 2022-04-12 MED ORDER — INSULIN ASPART 100 UNIT/ML IJ SOLN
0.0000 [IU] | Freq: Three times a day (TID) | INTRAMUSCULAR | Status: DC
  Administered 2022-04-13 (×2): 2 [IU] via SUBCUTANEOUS
  Administered 2022-04-14 – 2022-04-15 (×4): 1 [IU] via SUBCUTANEOUS
  Administered 2022-04-15 – 2022-04-17 (×2): 2 [IU] via SUBCUTANEOUS

## 2022-04-12 MED ORDER — CYCLOBENZAPRINE HCL 10 MG PO TABS
5.0000 mg | ORAL_TABLET | Freq: Every day | ORAL | Status: DC
  Administered 2022-04-12 – 2022-04-16 (×5): 5 mg via ORAL
  Filled 2022-04-12 (×5): qty 1

## 2022-04-12 MED ORDER — APIXABAN 5 MG PO TABS
5.0000 mg | ORAL_TABLET | Freq: Two times a day (BID) | ORAL | Status: DC
  Administered 2022-04-12 – 2022-04-17 (×11): 5 mg via ORAL
  Filled 2022-04-12 (×11): qty 1

## 2022-04-12 MED ORDER — AZITHROMYCIN 250 MG PO TABS
500.0000 mg | ORAL_TABLET | Freq: Every day | ORAL | Status: DC
  Administered 2022-04-12 – 2022-04-15 (×4): 500 mg via ORAL
  Filled 2022-04-12 (×4): qty 2

## 2022-04-12 MED ORDER — METHYLPREDNISOLONE SODIUM SUCC 40 MG IJ SOLR
40.0000 mg | INTRAMUSCULAR | Status: DC
  Administered 2022-04-13: 40 mg via INTRAVENOUS
  Filled 2022-04-12: qty 1

## 2022-04-12 NOTE — Telephone Encounter (Signed)
Pt is not diabetic.

## 2022-04-12 NOTE — Plan of Care (Signed)
  Problem: Education: Goal: Knowledge of General Education information will improve Description: Including pain rating scale, medication(s)/side effects and non-pharmacologic comfort measures Outcome: Progressing   Problem: Health Behavior/Discharge Planning: Goal: Ability to manage health-related needs will improve Outcome: Progressing   Problem: Clinical Measurements: Goal: Ability to maintain clinical measurements within normal limits will improve Outcome: Progressing   Problem: Activity: Goal: Risk for activity intolerance will decrease Outcome: Progressing   Problem: Nutrition: Goal: Adequate nutrition will be maintained Outcome: Progressing   Problem: Coping: Goal: Level of anxiety will decrease Outcome: Progressing   Problem: Elimination: Goal: Will not experience complications related to urinary retention Outcome: Progressing   Problem: Pain Managment: Goal: General experience of comfort will improve Outcome: Progressing   Problem: Coping: Goal: Ability to adjust to condition or change in health will improve Outcome: Progressing   Problem: Fluid Volume: Goal: Ability to maintain a balanced intake and output will improve Outcome: Progressing   Problem: Metabolic: Goal: Ability to maintain appropriate glucose levels will improve Outcome: Progressing   Problem: Skin Integrity: Goal: Risk for impaired skin integrity will decrease Outcome: Progressing

## 2022-04-12 NOTE — Plan of Care (Signed)
  Problem: Acute Rehab PT Goals(only PT should resolve) Goal: Pt Will Go Supine/Side To Sit Flowsheets (Taken 04/12/2022 1713) Pt will go Supine/Side to Sit: with modified independence Goal: Patient Will Transfer Sit To/From Stand Flowsheets (Taken 04/12/2022 1713) Patient will transfer sit to/from stand: with modified independence Goal: Pt Will Transfer Bed To Chair/Chair To Bed Flowsheets (Taken 04/12/2022 1713) Pt will Transfer Bed to Chair/Chair to Bed: with modified independence Goal: Pt Will Ambulate Flowsheets (Taken 04/12/2022 1713) Pt will Ambulate:  75 feet  with supervision  with rolling walker Goal: Pt Will Go Up/Down Stairs Flowsheets (Taken 04/12/2022 1713) Pt will Go Up / Down Stairs:  3-5 stairs  with supervision  with rail(s)  5:14 PM, 04/12/22 Josue Hector PT DPT  Physical Therapist with [facility]  [phone]

## 2022-04-12 NOTE — TOC Benefit Eligibility Note (Signed)
Patient Teacher, English as a foreign language completed.    The patient is currently admitted and upon discharge could be taking Eiquis.  The current 30 day co-pay is $0.00.     Patient Teacher, English as a foreign language completed.    The patient is currently admitted and upon discharge could be taking Entresto.  The current 30 day co-pay is $0.00.     Patient Teacher, English as a foreign language completed.    The patient is currently admitted and upon discharge could be taking [place].  The current 30 day co-pay is $0.00.   The patient is insured through [employer] Part D   This test claim was processed through [facility] amounts may vary at other pharmacies due to pharmacy/plan contracts, or as the patient moves through the different stages of their insurance plan.

## 2022-04-12 NOTE — Progress Notes (Signed)
ANTICOAGULATION CONSULT NOTE -   Pharmacy Consult for Heparin Indication: New onset Afib  Allergies  Allergen Reactions   Penicillins Hives, Shortness Of Breath and Swelling    Has patient had a PCN reaction causing immediate rash, facial/tongue/throat swelling, SOB or lightheadedness with hypotension: yes Has patient had a PCN reaction causing severe rash involving mucus membranes or skin necrosis:no Has patient had a PCN reaction that required hospitalization: yes Has patient had a PCN reaction occurring within the last 10 years: no If all of the above answers are "NO", then may proceed with Cephalosporin use. Tolerates rocephin/keflex   Sulfonamide Derivatives Other (See Comments)    Shaking all over, seizure like symptoms. Hospitalization resulted    Avapro [Irbesartan] Other (See Comments)    Pt associates avapro with bruising though aware scientifically not the case, wants to chanmge med   Levaquin [Levofloxacin] Itching    Patient Measurements: Height: 5\' 5"  (165.1 cm) Weight: 73.7 kg (162 lb 7.7 oz) IBW/kg (Calculated) : 57 Heparin Dosing Weight: 72 kg  Vital Signs: Temp: 97.9 F (36.6 C) (03/28 0400) Temp Source: Oral (03/28 0400) BP: 130/76 (03/28 0712) Pulse Rate: 82 (03/28 0712)  Labs: Recent Labs    04/10/22 0420 04/11/22 0447 04/12/22 0409  HGB 12.3 14.6 14.8  HCT 40.0 45.0 45.9  PLT 354 409* 408*  HEPARINUNFRC 0.37 0.44 0.35  CREATININE 0.91 0.75 0.75     Estimated Creatinine Clearance: 59.2 mL/min (by C-G formula based on SCr of 0.75 mg/dL).  Assessment: 77 yo female with new onset Afib, pharmacy consulted for heparin management.   HL 0.35- therapeutic  CBC WNL  Goal of Therapy:  Heparin level 0.3-0.7 units/ml Monitor platelets by anticoagulation protocol: Yes   Plan:  Continue heparin infusion at 1100 units/hr Check anti-Xa level daily Monitor H&H and platelets.   Margot Ables, PharmD Clinical Pharmacist 04/12/2022 8:04 AM

## 2022-04-12 NOTE — Progress Notes (Signed)
   04/12/22 1000  ReDS Vest / Clip  Station Marker B  Ruler Value 42  ReDS Value Range (!) > 40  ReDS Actual Value 19   19% with station B and ruler size 42 cm. MD made aware.

## 2022-04-12 NOTE — Progress Notes (Signed)
PROGRESS NOTE    Patient: [name]                            PCP: [name], MD                    DOB: 12/09/1945            DOA: 04/06/2022 [id number]             DOS: 04/12/2022, 10:16 AM   LOS: 5 days   Date of Service: The patient was seen and examined on 04/12/2022  Subjective:   The patient was seen and examined this morning, much more awake alert oriented, following command, Hemodynamically stable blood pressure has improved, heart rate has improved O2 demand has improved down to 1 L of oxygen satting 91%  Amiodarone has been switched to p.o., still on heparin drip,  Brief Narrative:   Brief Narrative:  [name] is a 77 y.o. female with past medical history relevant for COPD/asthma in the setting of ongoing tobacco use, as well as history of left breast cancer, diabetes type 2 and hypertension admitted on 04/07/2022 with acute hypoxic and hypercapnic respiratory failure in the setting of community-acquired pneumonia/fibrillation ablation as well as new onset atrial fibrillation and low EF.  Assessment and Plan:     1)Acute COPD exacerbation/tobacco abuse - -much improved, O2 demand is now down to 1 L of oxygen, satting 91% Mildly tachypneic with RR of 21,  Repeating ABG: 7.4 3/48/76 Obtaining CT angiogram: Negative for pulmonary embolism, CAD, small to moderate left pleural effusion, patchy infiltrate in both lower lobes ?  Interstitial pneumonia    --Likely secondary to respiratory infection - -continue IV steroids bronchodilators, mucolytic's and Rocephin/doxycycline as ordered -Appreciate pulmonary -Dr. Melvyn Novas input input   2)Presumed CAP-- - POA: productive cough with colored sputum, low-grade fevers and leukocytosis --Chest x-ray suggestive of possible atypical/viral pneumonia,  -Procalcitonin 0.26 -COVID, RSV and influenza negative -Respiratory culture NGTD -Acute on chronic WBC is 19.6 >>> 12.0 >>> 10.9, 10.7 -On IV doxycycline >>>  will be switched to p.o.   3)New onset A-fib with RVR- -heart rate has improved, stabilized -Was on ICU on amiodarone drip has been switched to p.o. by cardiology -Was on heparin drip switched to p.o. Eliquis  -Troponin 50 >> 50 >> 17 -TSH WNL  - Tachycardia induced cardiomyopathy with low EF Versus ischemic cardiomyopathy - -Discussed the case with Dr. Harl Bowie appreciate close follow-up and recommendations Recommended to continue Bisoprolo and PO amiodarone, Eliquis, Entresto 24/26 mg twice a day  Aldactone and IV Lasix, repleting potassium, checking magnesium    4) acute hypoxic and hypercapnic respiratory failure--- due to #1, #2 #3 above -Continue to improve, down to 1 L of oxygen, satting 91%  --POA - in the ED patient is found to be hypoxic with O2 sats of 84% on room air and 90% on 2 L with productive   -Hypoxia persisted -POA developed encephalopathy due to hypoxia -resolved  POA: uncompensated respiratory acidosis due to hypercapnia with a pH of 7.18 and pCO2 of 82 - was on BiPAP 04/08/2022 -tapered off now on oxygen via nasal cannula -Repeat ABG as needed   - CTA of the chest today reviewed negative for pulmonary embolism     5)HFrEF-----acute systolic dysfunction CHF- Filed Weights   04/06/22 1302 04/07/22 1104 04/08/22 0500  Weight: 72.6 kg 73.7 kg 73.7 kg  Intake/Output Summary (Last 24 hours) at 04/12/2022 1015 Last data filed at 04/12/2022 0900 Gross per 24 hour  Intake 1780.31 ml  Output 2450 ml  Net -669.69 ml  continue Bisoprolo and IV amiodarone added Entresto 24/26 mg twice daily today 04/10/2022 To continue Aldactone and IV Lasix, repleting potassium, checking magnesiu   --Troponin 50 >> 50 >> 17 Echo with EF of 35 to 40% with regional wall motion abnormalities--The inferior wall, mid anteroseptal segment, mid inferoseptal segment, and basal inferoseptal segment are hypokinetic  --Tachycardia induced cardiomyopathy with low EF Versus ischemic  cardiomyopathy -Per cardiology recommendation continue Bisoprolol, spironolactone 50 mg daily, aspirin, Entresto, Aldactone, Crestor, -Cont  IV Lasix 03/14/22 - start farxiga 10mg  daily.   Monitoring BUN/creatinine closely  -Appreciate follow-up with cardiology with recommendations    6)HTN--continue Bisoprolo and PO amiodarone added Entresto 24/26 mg twice daily today 04/10/2022 To continue Aldactone and IV Lasix,    7)DM2-recent A1c 6.3% reflecting excellent diabetic control PTA -Anticipate worsening glycemic control while on steroids for #1 above Use Novolog/Humalog Sliding scale insulin with Accu-Cheks/Fingersticks as ordered    8)Social/Ethics--- called patient's daughter [name] and granddaughter [name] who is patient's primary caregiver-full scope of care, comorbidities, and steep decline the patient has been discussed in detail -Patient is a full code without limitations to treatment at this point   Willing to discussed with the palliative care team--in anticipation of changing CODE STATUS, and clarification on goals of care  At this point patient's prognosis is poor due to acute respiratory failure due to pneumonia, and A-fib with RVR, labile vitals, significantly reduced heart failure new onset. Will call and discussed with the patient's daughter again  Consulting palliative care team   ------------------------------------------------------------------------------------------------------------------------- Nutritional status:  The patient's BMI is: Body mass index is 27.04 kg/m. I agree with the assessment and plan as outlined below: Nutrition Status:      ------------------------------------------------------------------------------------------------------------------------ Cultures; Blood Cultures x 2 >> NGT Sputum Culture >> NGT   -------------------------------------------------------------------------------------------------------------------------  Consults: PCCM/cardiology/palliative care  DVT prophylaxis:  SCDs Start: 04/06/22 1655 Place TED hose Start: 04/06/22 1655 apixaban (ELIQUIS) tablet 5 mg   Code Status:   Code Status: Full Code  Family Communication: No family member present at bedside- attempt will be made to update daily POA daughter [name] at [phone] Last discussion 04/11/2022 at bedside The above findings and plan of care has been discussed with patient (and family)  in detail,  they expressed understanding and agreement of above. -Advance care planning has been discussed.   Admission status:   Status is: Inpatient Remains inpatient appropriate because: Needing close monitoring and trending in intensive care unit for acute respiratory failure, A-fib with RVR   Disposition: From  - home             Planning for discharge in 2-3  days: to home with home health  Procedures:   No admission procedures for hospital encounter.   Antimicrobials:  Anti-infectives (From admission, onward)    Start     Dose/Rate Route Frequency Ordered Stop   04/07/22 1400  cefTRIAXone (ROCEPHIN) 2 g in sodium chloride 0.9 % 100 mL IVPB        2 g 200 mL/hr over 30 Minutes Intravenous Every 24 hours 04/06/22 1421 04/11/22 1610   04/07/22 1400  azithromycin (ZITHROMAX) 500 mg in sodium chloride 0.9 % 250 mL IVPB  Status:  Discontinued        500 mg 250 mL/hr over 60 Minutes Intravenous Every  24 hours 04/06/22 1421 04/06/22 1516   04/07/22 1400  doxycycline (VIBRAMYCIN) 100 mg in sodium chloride 0.9 % 250 mL IVPB        100 mg 125 mL/hr over 120 Minutes Intravenous Every 12 hours 04/06/22 1514 04/12/22 2359   04/06/22 1330  levofloxacin (LEVAQUIN) IVPB 750 mg        750 mg 100 mL/hr over 90 Minutes Intravenous  Once 04/06/22 1328 04/06/22 1438        Medication:   acidophilus  2 capsule  Oral TID   amiodarone  200 mg Oral BID   apixaban  5 mg Oral BID   bisoprolol  5 mg Oral Daily   calcium-vitamin D  1 tablet Oral BID   Chlorhexidine Gluconate Cloth  6 each Topical Daily   cyanocobalamin  500 mcg Oral Daily   cyclobenzaprine  5 mg Oral QHS   dapagliflozin propanediol  10 mg Oral Daily   furosemide  40 mg Intravenous Daily   guaiFENesin  1,200 mg Oral BID   ipratropium-albuterol  3 mL Nebulization TID   [START ON 04/13/2022] methylPREDNISolone (SOLU-MEDROL) injection  40 mg Intravenous Q24H   mometasone-formoterol  2 puff Inhalation BID   polyethylene glycol  17 g Oral Daily   potassium chloride  20 mEq Oral Daily   rosuvastatin  10 mg Oral Daily   sacubitril-valsartan  1 tablet Oral BID   sodium chloride flush  3 mL Intravenous Q12H   spironolactone  25 mg Oral Daily    acetaminophen **OR** acetaminophen, albuterol, bisacodyl, labetalol, levalbuterol, ondansetron **OR** ondansetron (ZOFRAN) IV, mouth rinse, pantoprazole, polyethylene glycol, traMADol, traZODone   Objective:   Vitals:   04/12/22 0712 04/12/22 0725 04/12/22 0800 04/12/22 0900  BP: 130/76  (!) 143/59 129/76  Pulse: 82  99 99  Resp: (!) 24  (!) 21 (!) 21  Temp:  98.2 F (36.8 C)    TempSrc:  Oral    SpO2: 94% 95% 90% 91%  Weight:      Height:        Intake/Output Summary (Last 24 hours) at 04/12/2022 1016 Last data filed at 04/12/2022 0900 Gross per 24 hour  Intake 1780.31 ml  Output 2450 ml  Net -669.69 ml   Filed Weights   04/06/22 1302 04/07/22 1104 04/08/22 0500  Weight: 72.6 kg 73.7 kg 73.7 kg     Physical examination:         General:  AAO x 3,  cooperative, mild-moderate distress with shortness of breath     HEENT:  Normocephalic, PERRL, otherwise with in Normal limits   Neuro:  CNII-XII intact. , normal motor and sensation, reflexes intact   Lungs:   Diffuse rhonchi,, Respirations unlabored,  No wheezes /mild lower lobe crackles  Cardio:    Irregularly irregular,,  No murmure, No Rubs or Gallops   Abdomen:  Soft, non-tender, bowel sounds active all four quadrants, no guarding or peritoneal signs.  Muscular  skeletal:  Limited exam -severe global generalized weaknesses - in bed, able to move all 4 extremities,   2+ pulses,  symmetric, No pitting edema  Skin:  Dry, warm to touch, negative for any Rashes,  Wounds: Please see nursing documentation              ------------------------------------------------------------------------------------------------------------------------------------------    LABs:     Latest Ref Rng & Units 04/12/2022    4:09 AM 04/11/2022    4:47 AM 04/10/2022    4:20 AM  CBC  WBC 4.0 -  10.5 K/uL 14.9  10.7  10.9   Hemoglobin 12.0 - 15.0 g/dL 14.8  14.6  12.3   Hematocrit 36.0 - 46.0 % 45.9  45.0  40.0   Platelets 150 - 400 K/uL 408  409  354       Latest Ref Rng & Units 04/12/2022    4:09 AM 04/11/2022    4:47 AM 04/10/2022    4:20 AM  CMP  Glucose 70 - 99 mg/dL 150  133  151   BUN 8 - 23 mg/dL 33  29  35   Creatinine 0.44 - 1.00 mg/dL 0.75  0.75  0.91   Sodium 135 - 145 mmol/L 137  139  141   Potassium 3.5 - 5.1 mmol/L 3.7  3.0  3.7   Chloride 98 - 111 mmol/L 99  100  107   CO2 22 - 32 mmol/L 29  29  23    Calcium 8.9 - 10.3 mg/dL 9.6  9.7  9.1        Micro Results   Radiology Reports No results found.  SIGNED: Deatra James, MD, FHM. FAAFP. [facility] - [facility] Time spent > 77 min.  Of critical care time was spent in seeing, evaluating and examining the patient. Reviewing medical records, labs, drawn plan of care. [facility],  Pager (please use [email or web address] to page/ text) Please use Epic Secure Chat for non-urgent communication (7AM-7PM)  If 7PM-7AM, please contact night-coverage [email or web address], 04/12/2022, 10:16 AM

## 2022-04-12 NOTE — Progress Notes (Signed)
Palliative: [name] is lying quietly in bed.  She appears acutely/chronically ill and quite frail.  She is resting comfortably, but wakes easily when I enter the room.  She greets me, making and somewhat keeping eye contact.  She is alert and oriented x 3, and although very weak, I believe that she is able to make her needs known.  There is no family at bedside at this time.  We talk about her acute health concerns, in particular her breathing.  She tells me that she feels she is getting a little better each day.  I encouraged her to be mindful of COPD flares as they will continue to cause lung scarring.  I ask if she feels like she would be able to stop smoking.  She tells me that she would like to.  I ask if anyone else is smoking in the home.  [name] tells me that her daughter is, but that she goes outside or opens a window.  I shared that it is difficult to quit when someone else is smoking in the home.  We talked about the possibility of short-term rehab when she becomes strong enough.  At this point she is agreeable. We talk about CODE STATUS, "treat the treatable, but allowing natural passing".  At this point [name] is still undecided.  I encouraged her to continue discussions with her healthcare surrogate, her daughter [name].  Conference with attending, bedside nursing staff, transition of care team related to patient condition, needs, goals of care, disposition.  Plan: At this point continue to treat the treatable.  Unable to set limits on life support.  Time for outcomes.  Need for short-term rehab would not be surprising.     39 minutes  [name], NP Palliative medicine team Team phone [phone] Greater than 50% of this time was spent counseling and coordinating care related to the above assessment and plan.

## 2022-04-12 NOTE — Progress Notes (Signed)
Rounding Note    Patient Name: [name] Date of Encounter: 04/12/2022  [place] Cardiologist: Carlyle Dolly, MD   Subjective   SOB improving  Inpatient Medications    Scheduled Meds:  acidophilus  2 capsule Oral TID   amiodarone  200 mg Oral BID   aspirin EC  81 mg Oral Q breakfast   bisoprolol  5 mg Oral Daily   calcium-vitamin D  1 tablet Oral BID   Chlorhexidine Gluconate Cloth  6 each Topical Daily   cyanocobalamin  500 mcg Oral Daily   cyclobenzaprine  5 mg Oral QHS   furosemide  40 mg Intravenous Daily   guaiFENesin  1,200 mg Oral BID   ipratropium-albuterol  3 mL Nebulization TID   [START ON 04/13/2022] methylPREDNISolone (SOLU-MEDROL) injection  40 mg Intravenous Q24H   mometasone-formoterol  2 puff Inhalation BID   polyethylene glycol  17 g Oral Daily   potassium chloride  20 mEq Oral Daily   rosuvastatin  10 mg Oral Daily   sacubitril-valsartan  1 tablet Oral BID   sodium chloride flush  3 mL Intravenous Q12H   spironolactone  25 mg Oral Daily   Continuous Infusions:  doxycycline (VIBRAMYCIN) IV Stopped (04/12/22 0439)   heparin 1,100 Units/hr (04/12/22 0507)   PRN Meds: acetaminophen **OR** acetaminophen, albuterol, bisacodyl, labetalol, levalbuterol, ondansetron **OR** ondansetron (ZOFRAN) IV, mouth rinse, pantoprazole, polyethylene glycol, traMADol, traZODone   Vital Signs    Vitals:   04/12/22 0500 04/12/22 0600 04/12/22 0712 04/12/22 0725  BP: 110/81 130/66 130/76   Pulse: 86 72 82   Resp: (!) 21 18 (!) 24   Temp:    98.2 F (36.8 C)  TempSrc:    Oral  SpO2: 95% 91% 94% 95%  Weight:      Height:        Intake/Output Summary (Last 24 hours) at 04/12/2022 0858 Last data filed at 04/12/2022 0507 Gross per 24 hour  Intake 3279.12 ml  Output 2450 ml  Net 829.12 ml      04/08/2022    5:00 AM 04/07/2022   11:04 AM 04/06/2022    1:02 PM  Last 3 Weights  Weight (lbs) 162 lb 7.7 oz 162 lb 7.7 oz 160 lb  Weight (kg) 73.7  kg 73.7 kg 72.576 kg      Telemetry    Rate controlled afib - Personally Reviewed  ECG    N/a - Personally Reviewed  Physical Exam   GEN: No acute distress.   Neck: No JVD Cardiac: irreg Respiratory: decreased breath sounds bilateral bases GI: Soft, nontender, non-distended  MS: No edema; No deformity. Neuro:  Nonfocal  Psych: Normal affect   Labs    High Sensitivity Troponin:   Recent Labs  Lab 04/06/22 1410 04/06/22 1552 04/08/22 0502  TROPONINIHS 50* 50* 17     Chemistry Recent Labs  Lab 04/06/22 1300 04/07/22 0556 04/10/22 0420 04/11/22 0447 04/11/22 0830 04/12/22 0409  NA 134*   < > 141 139  --  137  K 3.8   < > 3.7 3.0*  --  3.7  CL 98   < > 107 100  --  99  CO2 26   < > 23 29  --  29  GLUCOSE 138*   < > 151* 133*  --  150*  BUN 32*   < > 35* 29*  --  33*  CREATININE 0.96   < > 0.91 0.75  --  0.75  CALCIUM  9.3   < > 9.1 9.7  --  9.6  MG  --   --   --   --  1.8  --   PROT 8.0  --   --   --   --   --   ALBUMIN 3.6  --   --   --   --   --   AST 23  --   --   --   --   --   ALT 18  --   --   --   --   --   ALKPHOS 64  --   --   --   --   --   BILITOT 0.8  --   --   --   --   --   GFRNONAA >60   < > >60 >60  --  >60  ANIONGAP 10   < > 11 10  --  9   < > = values in this interval not displayed.    Lipids  Recent Labs  Lab 04/10/22 0420  CHOL 86  TRIG 15  HDL 47  LDLCALC 36  CHOLHDL 1.8    Hematology Recent Labs  Lab 04/10/22 0420 04/11/22 0447 04/12/22 0409  WBC 10.9* 10.7* 14.9*  RBC 4.36 5.10 5.17*  HGB 12.3 14.6 14.8  HCT 40.0 45.0 45.9  MCV 91.7 88.2 88.8  MCH 28.2 28.6 28.6  MCHC 30.8 32.4 32.2  RDW 14.8 14.1 14.2  PLT 354 409* 408*   Thyroid  Recent Labs  Lab 04/06/22 1552  TSH 0.501    BNP Recent Labs  Lab 04/12/22 0409  BNP 282.0*    DDimer No results for input(s): "DDIMER" in the last 168 hours.   Radiology    CT Angio Chest Pulmonary Embolism (PE) W or WO Contrast  Result Date: 04/10/2022 CLINICAL DATA:   Shortness of breath, clinical suspicion for pulmonary embolism EXAM: CT ANGIOGRAPHY CHEST WITH CONTRAST TECHNIQUE: Multidetector CT imaging of the chest was performed using the standard protocol during bolus administration of intravenous contrast. Multiplanar CT image reconstructions and MIPs were obtained to evaluate the vascular anatomy. RADIATION DOSE REDUCTION: This exam was performed according to the departmental dose-optimization program which includes automated exposure control, adjustment of the mA and/or kV according to patient size and/or use of iterative reconstruction technique. CONTRAST:  22mL OMNIPAQUE IOHEXOL 350 MG/ML SOLN COMPARISON:  Noncontrast CT chest done on 03/23/2022, chest radiograph done on 04/08/2022 FINDINGS: Cardiovascular: There are no intraluminal filling defects in pulmonary artery branches. Evaluation of small subsegmental branches in the lower lung fields is limited by infiltrates. Coronary artery calcifications are seen. There is homogeneous enhancement in the lumen of thoracic aorta. Scattered calcifications are seen in thoracic aorta and its major branches. Mediastinum/Nodes: Subcentimeter nodes are seen in mediastinum and hilar regions. Lungs/Pleura: Small to moderate bilateral pleural effusions are seen, more so on the left side. There are patchy infiltrates in both lower lobes. There are small scattered patchy infiltrates in both upper lobes. There is a pleural thickening in the anterolateral aspect of left upper lung field. There is no pneumothorax. Upper Abdomen: Surgical clips are seen in gallbladder fossa. Low-density in liver close to falciform ligament may suggest aberrant venous drainage. Musculoskeletal: No acute findings are seen. Review of the MIP images confirms the above findings. IMPRESSION: There is no evidence of pulmonary artery embolism. There is no evidence of thoracic aortic dissection. Coronary artery calcifications are seen. Small to moderate bilateral  pleural effusions, more so on the left side. There are patchy infiltrates in both lower lobes suggesting atelectasis/pneumonia. There are small scattered patchy infiltrates in both upper lobes, possibly suggesting foci of interstitial pneumonia. There is pleural thickening in the anterolateral aspect of left upper lung field which may be residual from previous intervention or previous trauma. Electronically Signed   By: Elmer Picker M.D.   On: 04/10/2022 15:00    Cardiac Studies   Echocardiogram 04/07/2022:  1. Left ventricular ejection fraction, by estimation, is 35 to 40%. The  left ventricle has moderately decreased function. The left ventricle  demonstrates regional wall motion abnormalities (see scoring  diagram/findings for description). There is mild  concentric left ventricular hypertrophy. Left ventricular diastolic  parameters are indeterminate.   2. Right ventricular systolic function is normal. The right ventricular  size is normal. Tricuspid regurgitation signal is inadequate for assessing  PA pressure.   3. The mitral valve is grossly normal. Trivial mitral valve  regurgitation.   4. The aortic valve is tricuspid. There is mild calcification of the  aortic valve. Aortic valve regurgitation is not visualized. Aortic valve  sclerosis is present, with no evidence of aortic valve stenosis.   5. The inferior vena cava is normal in size with greater than 50%  respiratory variability, suggesting right atrial pressure of 3 mmHg.   Patient Profile     [name] is a 77 y.o. female with a hx of incidental coronary calcification seen on CT, aortic atherosclerosis, stage 1 breast CA tx with lumpectomy/radiation/aromasin/Femara, COPD, DM, HTN, HLD, kidney stones, longstanding tobacco abuse, leukocytosis, thrombocytosis, arthritis who is being seen 04/09/2022 for the evaluation of afib and abnormal echo at the request of Dr. Denton Brick.   Assessment & Plan    1.HFrEF - new  diagnosis this admissin - 03/2022 echo LVEF [id number], indet diastolic, normal RV - CXR +pulm edema, no BNP from admit but today 282 - she is on IV lasix 40mg  daily, +829 per charting overnight and +1.9 L since admission. Cr is stable -check reds vest - in setting of COPD, pneumonia holding on ischemic evaluation at this time. No urgent indication, elderly frail patient. Would reevaluate considerations for ischemic testing as outpatient.    - medical therapy with bisoprolol 5mg , entresto 24/26mg  bid, aldactone 25mg  - start farxiga 10mg  daily.  - no JVD or edema, decreased breath soubds bilateral bases. BNP elevated on AM labs, f/u reds vest. Continue IV lasix today, if high reds vest may titrate dose.   2.Afib with RVR - new diagnosis this admission - initially on IV amio, transitioned to oral amio 200mg  bid - has been on hep gtt, can transition to oral DOAC  - rate controlled afib, can continue oral amio load. As outpatient if remains in afib and respiratory status improved could consider DCCV. With rates controlled and COPD exacerbation do not see indication for inpatient DCCV.   3. Coronary artery calcifications - noted on CT scan  4. COPD/pneumonia - per primary team - on nebs, steroids, ceftriaxone  For questions or updates, please contact [place] Please consult [email or web address] for contact info under        Signed, Carlyle Dolly, MD  04/12/2022, 8:58 AM

## 2022-04-12 NOTE — Evaluation (Signed)
Physical Therapy Evaluation Patient Details Name: [name] MRN: [id number] DOB: 12/07/1945 Today's Date: 04/12/2022  History of Present Illness  [name] is a 77 y.o. female with past medical history relevant for COPD/asthma in the setting of ongoing tobacco use, as well as history of left breast cancer, diabetes type 2 and hypertension admitted on 04/07/2022 with acute hypoxic and hypercapnic respiratory failure in the setting of community-acquired pneumonia/fibrillation ablation as well as new onset atrial fibrillation and low EF.   Clinical Impression  Patient presents supine in bed. She is awake, alert and cooperative. Patient using 1 L O2 at rest. Patient able to perform bed mobility and transfer with Min A. Patient is unsteady when standing and required Min guard and use of RW for improved stability. Patient performs sit to stand from bed x 3 with assist, but notes rapid onset of LE fatigue. Patient was able to ambulate 3 feet from bed to chair using RW and Min guard. Patient able to stand from chair with min A for repositioning. Oxygen sat fluctuates between 89-92% throughout session on 1 L O2. Patient left sitting upright in chair, with phone and call bell in reach. Patient will benefit from continued physical therapy in hospital and recommended venue below to increase strength, balance, endurance for safe ADLs and gait.      Recommendations for follow up therapy are one component of a multi-disciplinary discharge planning process, led by the attending physician.  Recommendations may be updated based on patient status, additional functional criteria and insurance authorization.  Follow Up Recommendations       Assistance Recommended at Discharge Intermittent Supervision/Assistance  Patient can return home with the following  A little help with walking and/or transfers;A little help with bathing/dressing/bathroom;Help with stairs or ramp for entrance    Equipment  Recommendations Rolling walker (2 wheels)  Recommendations for Other Services  OT consult    Functional Status Assessment Patient has had a recent decline in their functional status and demonstrates the ability to make significant improvements in function in a reasonable and predictable amount of time.     Precautions / Restrictions Precautions Precautions: Fall Restrictions Weight Bearing Restrictions: No      Mobility  Bed Mobility Overal bed mobility: Needs Assistance Bed Mobility: Supine to Sit     Supine to sit: Min assist     General bed mobility comments: hand hold assist and rails to sit up    Transfers Overall transfer level: Needs assistance Equipment used: Rolling walker (2 wheels) Transfers: Sit to/from Stand Sit to Stand: Min assist           General transfer comment: labored movement, Min A and using RW for HHA    Ambulation/Gait Ambulation/Gait assistance: Min guard Gait Distance (Feet): 3 Feet Assistive device: Rolling walker (2 wheels) Gait Pattern/deviations: Decreased stride length, Trunk flexed       General Gait Details: slow, small step gait, cautious, fatigues quickly  Stairs            Wheelchair Mobility    Modified Rankin (Stroke Patients Only)       Balance Overall balance assessment: Needs assistance Sitting-balance support: Bilateral upper extremity supported, Feet supported Sitting balance-Leahy Scale: Poor Sitting balance - Comments: at EOB   Standing balance support: Bilateral upper extremity supported, Reliant on assistive device for balance Standing balance-Leahy Scale: Poor Standing balance comment: using RW at bedside  Pertinent Vitals/Pain Pain Assessment Pain Assessment: No/denies pain    Home Living Family/patient expects to be discharged to:: Private residence Living Arrangements: Children Available Help at Discharge: Family Type of Home: House Home Access:  Stairs to enter Entrance Stairs-Rails: Left Entrance Stairs-Number of Steps: 4   Home Layout: Two level;Able to live on main level with bedroom/bathroom Home Equipment: None Additional Comments: Patient reports previous IND ambualator and has no equipment in home    Prior Function Prior Level of Function : Needs assist       Physical Assist : ADLs (physical);Mobility (physical) Mobility (physical): Transfers;Stairs ADLs (physical): IADLs   ADLs Comments: Patient reports previous IND ambualator but Daughter assists with community ADLs and PRN in home tasks     Hand Dominance        Extremity/Trunk Assessment   Upper Extremity Assessment Upper Extremity Assessment: Defer to OT evaluation    Lower Extremity Assessment Lower Extremity Assessment: Generalized weakness    Cervical / Trunk Assessment Cervical / Trunk Assessment: Kyphotic  Communication   Communication: No difficulties  Cognition Arousal/Alertness: Awake/alert Behavior During Therapy: WFL for tasks assessed/performed Overall Cognitive Status: Within Functional Limits for tasks assessed                                          General Comments      Exercises     Assessment/Plan    PT Assessment Patient needs continued PT services  PT Problem List Decreased strength;Decreased mobility;Decreased activity tolerance;Decreased balance       PT Treatment Interventions DME instruction;Therapeutic activities;Gait training;Therapeutic exercise;Patient/family education;Stair training;Balance training;Functional mobility training;Neuromuscular re-education    PT Goals (Current goals can be found in the Care Plan section)  Acute Rehab PT Goals Patient Stated Goal: Return home PT Goal Formulation: With patient Time For Goal Achievement: 04/26/22 Potential to Achieve Goals: Good    Frequency Min 3X/week     Co-evaluation               AM-PAC PT "6 Clicks" Mobility  Outcome  Measure Help needed turning from your back to your side while in a flat bed without using bedrails?: A Little Help needed moving from lying on your back to sitting on the side of a flat bed without using bedrails?: A Little Help needed moving to and from a bed to a chair (including a wheelchair)?: A Little Help needed standing up from a chair using your arms (e.g., wheelchair or bedside chair)?: A Little Help needed to walk in hospital room?: A Little Help needed climbing 3-5 steps with a railing? : A Lot 6 Click Score: 17    End of Session Equipment Utilized During Treatment: Gait belt;Oxygen Activity Tolerance: Patient limited by fatigue Patient left: in chair;with call bell/phone within reach Nurse Communication: Mobility status PT Visit Diagnosis: Unsteadiness on feet (R26.81);Difficulty in walking, not elsewhere classified (R26.2);Other abnormalities of gait and mobility (R26.89);Muscle weakness (generalized) (M62.81)    Time: [id number] PT Time Calculation (min) (ACUTE ONLY): 33 min   Charges:   PT Evaluation $PT Eval Low Complexity: 1 Low PT Treatments $Therapeutic Activity: 8-22 mins      5:12 PM, 04/12/22 Josue Hector PT DPT  Physical Therapist with [facility]  [phone]

## 2022-04-12 NOTE — Progress Notes (Signed)
Bipap order is PRN, not needed at this time.

## 2022-04-12 NOTE — Telephone Encounter (Signed)
[name], [phone]   Called stating that pt is wanting a Dexcom G7??

## 2022-04-13 ENCOUNTER — Inpatient Hospital Stay (HOSPITAL_COMMUNITY): Payer: 59

## 2022-04-13 DIAGNOSIS — J9601 Acute respiratory failure with hypoxia: Secondary | ICD-10-CM | POA: Diagnosis not present

## 2022-04-13 LAB — BASIC METABOLIC PANEL
Anion gap: 7 (ref 5–15)
BUN: 33 mg/dL — ABNORMAL HIGH (ref 8–23)
CO2: 32 mmol/L (ref 22–32)
Calcium: 9.9 mg/dL (ref 8.9–10.3)
Chloride: 100 mmol/L (ref 98–111)
Creatinine, Ser: 0.85 mg/dL (ref 0.44–1.00)
GFR, Estimated: >60 mL/min (ref >60)
Glucose, Bld: 119 mg/dL — ABNORMAL HIGH (ref 70–99)
Potassium: 4.3 mmol/L (ref 3.5–5.1)
Sodium: 139 mmol/L (ref 135–145)

## 2022-04-13 LAB — GLUCOSE, CAPILLARY
Glucose-Capillary: 130 mg/dL — ABNORMAL HIGH (ref 70–99)
Glucose-Capillary: 226 mg/dL — ABNORMAL HIGH (ref 70–99)
Glucose-Capillary: 204 mg/dL — ABNORMAL HIGH (ref 70–99)
Glucose-Capillary: 214 mg/dL — ABNORMAL HIGH (ref 70–99)

## 2022-04-13 LAB — CBC
HCT: 49.1 % — ABNORMAL HIGH (ref 36.0–46.0)
Hemoglobin: 15.6 g/dL — ABNORMAL HIGH (ref 12.0–15.0)
MCH: 28.5 pg (ref 26.0–34.0)
MCHC: 31.8 g/dL (ref 30.0–36.0)
MCV: 89.6 fL (ref 80.0–100.0)
Platelets: 457 10*3/uL — ABNORMAL HIGH (ref 150–400)
RBC: 5.48 MIL/uL — ABNORMAL HIGH (ref 3.87–5.11)
RDW: 14.6 % (ref 11.5–15.5)
WBC: 17.8 10*3/uL — ABNORMAL HIGH (ref 4.0–10.5)
nRBC: 0 % (ref 0.0–0.2)

## 2022-04-13 LAB — BRAIN NATRIURETIC PEPTIDE: B Natriuretic Peptide: 141 pg/mL — ABNORMAL HIGH (ref 0.0–100.0)

## 2022-04-13 MED ORDER — METHYLPREDNISOLONE SODIUM SUCC 40 MG IJ SOLR
20.0000 mg | INTRAMUSCULAR | Status: DC
  Administered 2022-04-14 – 2022-04-15 (×2): 20 mg via INTRAVENOUS
  Filled 2022-04-13 (×2): qty 1

## 2022-04-13 NOTE — Plan of Care (Signed)
  Problem: Acute Rehab OT Goals (only OT should resolve) Goal: Pt. Will Perform Upper Body Bathing Flowsheets (Taken 04/13/2022 1116) Pt Will Perform Upper Body Bathing:  sitting  with set-up Goal: Pt. Will Perform Lower Body Dressing Flowsheets (Taken 04/13/2022 1116) Pt Will Perform Lower Body Dressing:  with min assist  sitting/lateral leans Goal: Pt. Will Transfer To Toilet Flowsheets (Taken 04/13/2022 1116) Pt Will Transfer to Toilet: with min guard assist    Arvil Persons, OTR/L

## 2022-04-13 NOTE — Progress Notes (Signed)
   04/13/22 0800  ReDS Vest / Clip  Station Marker B  Ruler Value 42  ReDS Value Range (!) > 40  ReDS Actual Value 23   23% with station B and ruler value 42cm, will continue to monitor and will endorse to the attending.

## 2022-04-13 NOTE — Progress Notes (Signed)
PROGRESS NOTE    Patient: [name]                            PCP: [name], MD                    DOB: 10-Nov-1945            DOA: 04/06/2022 [id number]             DOS: 04/13/2022, 11:49 AM   LOS: 6 days   Date of Service: The patient was seen and examined on 04/13/2022  Subjective:   The patient was seen and examined this morning, awake alert oriented mild shortness of breath, worsening with exertion. Hemodynamically stable heart rate improved 52 this a.m., mildly tachypneic with respirate of 21, blood pressure soft 111/67 satting 91% on 2 L of oxygen  Amiodarone has been switched to p.o., still on heparin drip,  Brief Narrative:   Brief Narrative:  [name] is a 77 y.o. female with past medical history relevant for COPD/asthma in the setting of ongoing tobacco use, as well as history of left breast cancer, diabetes type 2 and hypertension admitted on 04/07/2022 with acute hypoxic and hypercapnic respiratory failure in the setting of community-acquired pneumonia/fibrillation ablation as well as new onset atrial fibrillation and low EF.  Assessment and Plan:     1)Acute COPD exacerbation/tobacco abuse -acute respiratory failure -continue to improve--down to 2 L of oxygen, satting 91% Mildly tachypneic with RR of 21,  Repeating ABG: 7.4 3/48/76 Obtaining CT angiogram: Negative for pulmonary embolism, CAD, small to moderate left pleural effusion, patchy infiltrate in both lower lobes ?  Interstitial pneumonia  --Likely secondary to respiratory infection - -continue nebs, tapering down steroids, de-escalating antibiotics -Appreciate pulmonary -Dr. Melvyn Novas input input   2)Presumed CAP-- - POA: productive cough with colored sputum, low-grade fevers and leukocytosis --Chest x-ray suggestive of possible atypical/viral pneumonia,  -Procalcitonin 0.26 -COVID, RSV and influenza negative -Respiratory culture NGTD -Acute on chronic WBC is 19.6 >>> 12.0 >>> 10.9,  10.7>>17.8 (on steroids) -De-escalating antibiotics, switch to p.o. azithromycin   3)New onset A-fib with RVR- -heart rate has improved, stabilized -Was on ICU on amiodarone drip has been switched to p.o.  -Was on heparin drip switched to p.o. Eliquis  -Troponin 50 >> 50 >> 17 -TSH WNL  - Tachycardia induced cardiomyopathy with low EF Versus ischemic cardiomyopathy -Discussed with cardiology-appreciate close follow-up Recommended to continue Bisoprolo and PO amiodarone, Eliquis, Entresto 24/26 mg twice a day  Aldactone and IV Lasix,  Repleted potassium, checking magnesium    4) acute hypoxic and hypercapnic respiratory failure--- due to #1, #2 #3 above -Continue to improve, down to 1 L of oxygen, satting 91%  --POA - in the ED patient is found to be hypoxic with O2 sats of 84% on room air and 90% on 2 L with productive   -Hypoxia persisted -POA developed encephalopathy due to hypoxia -resolved  POA: uncompensated respiratory acidosis due to hypercapnia with a pH of 7.18 and pCO2 of 82 - was on BiPAP 04/08/2022 -tapered off now on oxygen via nasal cannula -Repeat ABG as needed   - CTA of the chest today reviewed negative for pulmonary embolism     5)HFrEF-----acute systolic dysfunction CHF- Filed Weights   04/06/22 1302 04/07/22 1104 04/08/22 0500  Weight: 72.6 kg 73.7 kg 73.7 kg    Intake/Output Summary (Last 24 hours) at 04/13/2022  [place] filed at 04/13/2022 1000 Gross per 24 hour  Intake 120 ml  Output 2150 ml  Net -2030 ml  continue Bisoprolo and IV amiodarone added Entresto 24/26 mg twice daily today 04/10/2022 To continue Aldactone and IV Lasix, repleting potassium, checking magnesiu   --Troponin 50 >> 50 >> 17 -- REDs: 23 now   Echo with EF of 35 to 40% with regional wall motion abnormalities--The inferior wall, mid anteroseptal segment, mid inferoseptal segment, and basal inferoseptal segment are hypokinetic  --Tachycardia induced cardiomyopathy with low  EF Versus ischemic cardiomyopathy -Per cardiology recommendation continue:  Bisoprolol, spironolactone 50 mg daily, aspirin, Entresto, Aldactone, Crestor, -Cont  IV Lasix 03/14/22 - start farxiga 10mg  daily.   Monitoring BUN/creatinine closely  -Appreciate follow-up with cardiology with recommendations    6)HTN--continue Bisoprolo and PO amiodarone added Entresto 24/26 mg twice daily today 04/10/2022 To continue Aldactone and IV Lasix,    7)DM2-recent A1c 6.3% reflecting excellent diabetic control PTA -Hyperglycemic on steroids Use Novolog/Humalog Sliding scale insulin with Accu-Cheks/Fingersticks as ordered    8)Social/Ethics--- called patient's daughter [name] and granddaughter [name] who is patient's primary caregiver-full scope of care, comorbidities, and steep decline the patient has been discussed in detail -Patient is a full code without limitations to treatment at this point   -Patient remains full code  At this point patient's prognosis is poor due to acute respiratory failure due to pneumonia, and A-fib with RVR, labile vitals, significantly reduced heart failure new onset.   Consulting palliative care team -following   ------------------------------------------------------------------------------------------------------------------------- Nutritional status:  The patient's BMI is: Body mass index is 27.04 kg/m. I agree with the assessment and plan as outlined below: Nutrition Status:      ------------------------------------------------------------------------------------------------------------------------ Cultures; Blood Cultures x 2 >> NGT Sputum Culture >> NGT  -------------------------------------------------------------------------------------------------------------------------  Consults: PCCM/cardiology/palliative care  DVT prophylaxis:  SCDs Start: 04/06/22 1655 Place TED hose Start: 04/06/22 1655 apixaban (ELIQUIS) tablet 5 mg    Code Status:   Code Status: Full Code  Family Communication: No family member present at bedside- attempt will be made to update daily POA daughter [name] at [phone] Last discussion 04/11/2022 at bedside The above findings and plan of care has been discussed with patient (and family)  in detail,  they expressed understanding and agreement of above. -Advance care planning has been discussed.   Admission status:   Status is: Inpatient Remains inpatient appropriate because: Needing close monitoring and trending in intensive care unit for acute respiratory failure, A-fib with RVR   Disposition: From  - home             Planning for discharge in 2-3  days: to home with home health  Procedures:   No admission procedures for hospital encounter.   Antimicrobials:  Anti-infectives (From admission, onward)    Start     Dose/Rate Route Frequency Ordered Stop   04/12/22 1115  azithromycin (ZITHROMAX) tablet 500 mg        500 mg Oral Daily 04/12/22 1019     04/07/22 1400  cefTRIAXone (ROCEPHIN) 2 g in sodium chloride 0.9 % 100 mL IVPB        2 g 200 mL/hr over 30 Minutes Intravenous Every 24 hours 04/06/22 1421 04/12/22 1442   04/07/22 1400  azithromycin (ZITHROMAX) 500 mg in sodium chloride 0.9 % 250 mL IVPB  Status:  Discontinued        500 mg 250 mL/hr over 60 Minutes Intravenous Every 24 hours 04/06/22 1421  04/06/22 1516   04/07/22 1400  doxycycline (VIBRAMYCIN) 100 mg in sodium chloride 0.9 % 250 mL IVPB  Status:  Discontinued        100 mg 125 mL/hr over 120 Minutes Intravenous Every 12 hours 04/06/22 1514 04/12/22 1019   04/06/22 1330  levofloxacin (LEVAQUIN) IVPB 750 mg        750 mg 100 mL/hr over 90 Minutes Intravenous  Once 04/06/22 1328 04/06/22 1438        Medication:   acidophilus  2 capsule Oral TID   amiodarone  200 mg Oral BID   apixaban  5 mg Oral BID   azithromycin  500 mg Oral Daily   bisoprolol  5 mg Oral Daily   calcium-vitamin D  1 tablet  Oral BID   Chlorhexidine Gluconate Cloth  6 each Topical Daily   cyanocobalamin  500 mcg Oral Daily   cyclobenzaprine  5 mg Oral QHS   dapagliflozin propanediol  10 mg Oral Daily   guaiFENesin  1,200 mg Oral BID   insulin aspart  0-6 Units Subcutaneous TID WC   ipratropium-albuterol  3 mL Nebulization TID   [START ON 04/14/2022] methylPREDNISolone (SOLU-MEDROL) injection  20 mg Intravenous Q24H   mometasone-formoterol  2 puff Inhalation BID   polyethylene glycol  17 g Oral Daily   rosuvastatin  10 mg Oral Daily   sacubitril-valsartan  1 tablet Oral BID   sodium chloride flush  3 mL Intravenous Q12H   spironolactone  25 mg Oral Daily    acetaminophen **OR** acetaminophen, albuterol, bisacodyl, labetalol, levalbuterol, ondansetron **OR** ondansetron (ZOFRAN) IV, mouth rinse, pantoprazole, polyethylene glycol, traMADol, traZODone   Objective:   Vitals:   04/13/22 0805 04/13/22 0900 04/13/22 1000 04/13/22 1100  BP:  112/68 (!) 131/114 111/67  Pulse:  94 99 (!) 52  Resp:  20 (!) 23 (!) 21  Temp:      TempSrc:      SpO2: 90% 90% 90% 91%  Weight:      Height:        Intake/Output Summary (Last 24 hours) at 04/13/2022 1149 Last data filed at 04/13/2022 1000 Gross per 24 hour  Intake 120 ml  Output 2150 ml  Net -2030 ml   Filed Weights   04/06/22 1302 04/07/22 1104 04/08/22 0500  Weight: 72.6 kg 73.7 kg 73.7 kg     Physical examination:    General:  AAO x 3,  cooperative, mild distress w SOB    HEENT:  Normocephalic, PERRL, otherwise with in Normal limits   Neuro:  CNII-XII intact. , normal motor and sensation, reflexes intact   Lungs:   Clear to auscultation BL, Respirations unlabored,  No wheezes / crackles  Cardio:    S1/S2, RRR, No murmure, No Rubs or Gallops   Abdomen:  Soft, non-tender, bowel sounds active all four quadrants, no guarding or peritoneal signs.  Muscular  skeletal:  Limited exam -global generalized weaknesses - in bed, able to move all 4  extremities,   2+ pulses,  symmetric, +1  pitting edema  Skin:  Dry, warm to touch, negative for any Rashes,  Wounds: Please see nursing documentation           ------------------------------------------------------------------------------------------------------------------------------------------    LABs:     Latest Ref Rng & Units 04/13/2022    4:11 AM 04/12/2022    4:09 AM 04/11/2022    4:47 AM  CBC  WBC 4.0 - 10.5 K/uL 17.8  14.9  10.7   Hemoglobin 12.0 -  15.0 g/dL 15.6  14.8  14.6   Hematocrit 36.0 - 46.0 % 49.1  45.9  45.0   Platelets 150 - 400 K/uL 457  408  409       Latest Ref Rng & Units 04/13/2022    6:49 AM 04/12/2022    4:09 AM 04/11/2022    4:47 AM  CMP  Glucose 70 - 99 mg/dL 119  150  133   BUN 8 - 23 mg/dL 33  33  29   Creatinine 0.44 - 1.00 mg/dL 0.85  0.75  0.75   Sodium 135 - 145 mmol/L 139  137  139   Potassium 3.5 - 5.1 mmol/L 4.3  3.7  3.0   Chloride 98 - 111 mmol/L 100  99  100   CO2 22 - 32 mmol/L 32  29  29   Calcium 8.9 - 10.3 mg/dL 9.9  9.6  9.7        Micro Results   Radiology Reports DG CHEST PORT 1 VIEW  Result Date: 04/13/2022 CLINICAL DATA:  Shortness of breath EXAM: PORTABLE CHEST 1 VIEW COMPARISON:  Chest x-ray dated April 08, 2022 FINDINGS: Cardiac and mediastinal contours are unchanged. Small left pleural effusion and bibasilar atelectasis. No evidence of pneumothorax. IMPRESSION: Small left pleural effusion and bibasilar atelectasis. Electronically Signed   By: Yetta Glassman M.D.   On: 04/13/2022 09:33    SIGNED: Deatra James, MD, FHM. FAAFP. [facility] - [facility] Time spent > 77 min.  Of critical care time was spent in seeing, evaluating and examining the patient. Reviewing medical records, labs, drawn plan of care. [facility],  Pager (please use [email or web address] to page/ text) Please use Epic Secure Chat for non-urgent communication (7AM-7PM)  If 7PM-7AM, please contact  night-coverage [email or web address], 04/13/2022, 11:49 AM

## 2022-04-13 NOTE — Progress Notes (Signed)
Bipap order is PRN.  No distress noted.

## 2022-04-13 NOTE — Progress Notes (Addendum)
Rounding Note    Patient Name: [name] Date of Encounter: 04/13/2022  [place] Cardiologist: Carlyle Dolly, MD   Subjective   PT denies CP  Breathing is a little better   Inpatient Medications    Scheduled Meds:  acidophilus  2 capsule Oral TID   amiodarone  200 mg Oral BID   apixaban  5 mg Oral BID   azithromycin  500 mg Oral Daily   bisoprolol  5 mg Oral Daily   calcium-vitamin D  1 tablet Oral BID   Chlorhexidine Gluconate Cloth  6 each Topical Daily   cyanocobalamin  500 mcg Oral Daily   cyclobenzaprine  5 mg Oral QHS   dapagliflozin propanediol  10 mg Oral Daily   furosemide  40 mg Intravenous Daily   guaiFENesin  1,200 mg Oral BID   insulin aspart  0-6 Units Subcutaneous TID WC   ipratropium-albuterol  3 mL Nebulization TID   [START ON 04/14/2022] methylPREDNISolone (SOLU-MEDROL) injection  20 mg Intravenous Q24H   mometasone-formoterol  2 puff Inhalation BID   polyethylene glycol  17 g Oral Daily   potassium chloride  20 mEq Oral Daily   rosuvastatin  10 mg Oral Daily   sacubitril-valsartan  1 tablet Oral BID   sodium chloride flush  3 mL Intravenous Q12H   spironolactone  25 mg Oral Daily   Continuous Infusions:   PRN Meds: acetaminophen **OR** acetaminophen, albuterol, bisacodyl, labetalol, levalbuterol, ondansetron **OR** ondansetron (ZOFRAN) IV, mouth rinse, pantoprazole, polyethylene glycol, traMADol, traZODone   Vital Signs    Vitals:   04/13/22 0700 04/13/22 0800 04/13/22 0805 04/13/22 0900  BP: 130/64 129/81  112/68  Pulse: 67 (!) 43  94  Resp: 18 (!) 21  20  Temp:  98.3 F (36.8 C)    TempSrc:  Oral    SpO2: 91% 90% 90% 90%  Weight:      Height:        Intake/Output Summary (Last 24 hours) at 04/13/2022 1009 Last data filed at 04/12/2022 1409 Gross per 24 hour  Intake 120 ml  Output 1850 ml  Net -1730 ml      04/08/2022    5:00 AM 04/07/2022   11:04 AM 04/06/2022    1:02 PM  Last 3 Weights  Weight (lbs) 162  lb 7.7 oz 162 lb 7.7 oz 160 lb  Weight (kg) 73.7 kg 73.7 kg 72.576 kg      Telemetry    Afib   80s  Personally Reviewed  ECG    N/a - Personally Reviewed  Physical Exam   GEN: No acute distress.   Neck: No JVD Cardiac: irreg irreg   Respiratory: Markedly decreased airflow   NO rales   GI: Soft, nontender, non-distended  MS: Trivial LE edema; No deformity. Neuro:  Nonfocal  Psych: Normal affect   Labs    High Sensitivity Troponin:   Recent Labs  Lab 04/06/22 1410 04/06/22 1552 04/08/22 0502  TROPONINIHS 50* 50* 17     Chemistry Recent Labs  Lab 04/06/22 1300 04/07/22 0556 04/11/22 0447 04/11/22 0830 04/12/22 0409 04/13/22 0649  NA 134*   < > 139  --  137 139  K 3.8   < > 3.0*  --  3.7 4.3  CL 98   < > 100  --  99 100  CO2 26   < > 29  --  29 32  GLUCOSE 138*   < > 133*  --  150* 119*  BUN 32*   < > 29*  --  33* 33*  CREATININE 0.96   < > 0.75  --  0.75 0.85  CALCIUM 9.3   < > 9.7  --  9.6 9.9  MG  --   --   --  1.8  --   --   PROT 8.0  --   --   --   --   --   ALBUMIN 3.6  --   --   --   --   --   AST 23  --   --   --   --   --   ALT 18  --   --   --   --   --   ALKPHOS 64  --   --   --   --   --   BILITOT 0.8  --   --   --   --   --   GFRNONAA >60   < > >60  --  >60 >60  ANIONGAP 10   < > 10  --  9 7   < > = values in this interval not displayed.    Lipids  Recent Labs  Lab 04/10/22 0420  CHOL 86  TRIG 15  HDL 47  LDLCALC 36  CHOLHDL 1.8    Hematology Recent Labs  Lab 04/11/22 0447 04/12/22 0409 04/13/22 0411  WBC 10.7* 14.9* 17.8*  RBC 5.10 5.17* 5.48*  HGB 14.6 14.8 15.6*  HCT 45.0 45.9 49.1*  MCV 88.2 88.8 89.6  MCH 28.6 28.6 28.5  MCHC 32.4 32.2 31.8  RDW 14.1 14.2 14.6  PLT 409* 408* 457*   Thyroid  Recent Labs  Lab 04/06/22 1552  TSH 0.501    BNP Recent Labs  Lab 04/12/22 0409 04/13/22 0411  BNP 282.0* 141.0*    DDimer No results for input(s): "DDIMER" in the last 168 hours.   Radiology    DG CHEST PORT 1  VIEW  Result Date: 04/13/2022 CLINICAL DATA:  Shortness of breath EXAM: PORTABLE CHEST 1 VIEW COMPARISON:  Chest x-ray dated April 08, 2022 FINDINGS: Cardiac and mediastinal contours are unchanged. Small left pleural effusion and bibasilar atelectasis. No evidence of pneumothorax. IMPRESSION: Small left pleural effusion and bibasilar atelectasis. Electronically Signed   By: Yetta Glassman M.D.   On: 04/13/2022 09:33    Cardiac Studies   Echocardiogram 04/07/2022:  1. Left ventricular ejection fraction, by estimation, is 35 to 40%. The  left ventricle has moderately decreased function. The left ventricle  demonstrates regional wall motion abnormalities (see scoring  diagram/findings for description). There is mild  concentric left ventricular hypertrophy. Left ventricular diastolic  parameters are indeterminate.   2. Right ventricular systolic function is normal. The right ventricular  size is normal. Tricuspid regurgitation signal is inadequate for assessing  PA pressure.   3. The mitral valve is grossly normal. Trivial mitral valve  regurgitation.   4. The aortic valve is tricuspid. There is mild calcification of the  aortic valve. Aortic valve regurgitation is not visualized. Aortic valve  sclerosis is present, with no evidence of aortic valve stenosis.   5. The inferior vena cava is normal in size with greater than 50%  respiratory variability, suggesting right atrial pressure of 3 mmHg.   Patient Profile     [name] is a 77 y.o. female with a hx of incidental coronary calcification seen on CT, aortic atherosclerosis, stage 1 breast CA tx with lumpectomy/radiation/aromasin/Femara, COPD,  DM, HTN, HLD, kidney stones, longstanding tobacco abuse, leukocytosis, thrombocytosis, arthritis who is being seen 04/09/2022 for the evaluation of afib and abnormal echo at the request of Dr. Denton Brick.   Assessment & Plan    1.HFrEF - new diagnosis this admissin - 03/2022 echo LVEF 35-40%,  normal RV - CXR +pulm edema, - she is on IV lasix 40mg  daily I/O  +28 cc  Wts not done     ReDs vest reading today 23     Reassess in labs / urine output today/tomorrow   Stop lasix/k for now   Note pt with signifcant COPD   No plans for further testing for now as inpt   Needs to recover from COPD flare, reassess at that time   - medical therapy with bisoprolol 5mg , entresto 24/26mg  bid, aldactone 25mg , farxiga   10 mg   2.Afib with RVR - new diagnosis this admission - initially on IV amio, transitioned to oral amio 200mg  bid - has been on hep gtt, can transition to oral DOAC  - rate controlled afib, can continue oral amio load. As outpatient if remains in afib and respiratory status improved could consider DCCV. With rates controlled and COPD exacerbation do not see indication for inpatient DCCV.   3. Coronary artery calcifications - noted on CT scan  4.Pulmonary   Pt with severe  COPD - per primary team - on nebs, steroids, ceftriaxone  5  HL Continue Crestor       For questions or updates, please contact [place] Please consult [email or web address] for contact info under        Signed, Dorris Carnes, MD  04/13/2022, 10:09 AM

## 2022-04-13 NOTE — Care Management Important Message (Signed)
Important Message  Patient Details  Name: [name] MRN: [id number] Date of Birth: 04/08/1945   Medicare Important Message Given:  Yes     Tommy Medal 04/13/2022, 1:12 PM

## 2022-04-13 NOTE — Progress Notes (Signed)
Ok to stop azith in 2 days per Dr. Laymond Purser to complete 10d.  Onnie Boer, PharmD, BCIDP, AAHIVP, CPP Infectious Disease Pharmacist 04/13/2022 1:40 PM

## 2022-04-13 NOTE — TOC Initial Note (Signed)
Transition of Care [facility]) - Initial/Assessment Note    Patient Details  Name: [name] MRN: [id number] Date of Birth: [birth year]  Transition of Care [facility]) CM/SW Contact:    Iona Beard, [place] Phone Number: 04/13/2022, 11:15 AM  Clinical Narrative:                 Pt is high risk for readmission. CSW spoke with pts granddaughter to complete assessment. Pt lives with her daughter and granddaughter. Pts granddaughter is pts aide and it with her daily. Pt has assistance in the home at all times. CSW explained that PT is recommending HH at D/C. Pts granddaughter states that they do not need HH at this time. TOC to follow.   Expected Discharge Plan: Home/Self Care Barriers to Discharge: Continued Medical Work up   Patient Goals and CMS Choice Patient states their goals for this hospitalization and ongoing recovery are:: return home CMS Medicare.gov Compare Post Acute Care list provided to:: Patient Represenative (must comment) Choice offered to / list presented to : Adult Children      Expected Discharge Plan and Services In-house Referral: Clinical Social Work Discharge Planning Services: CM Consult   Living arrangements for the past 2 months: Single Family Home                                      Prior Living Arrangements/Services Living arrangements for the past 2 months: Single Family Home Lives with:: Adult Children, Relatives Patient language and need for interpreter reviewed:: Yes Do you feel safe going back to the place where you live?: Yes      Need for Family Participation in Patient Care: Yes (Comment) Care giver support system in place?: Yes (comment)   Criminal Activity/Legal Involvement Pertinent to Current Situation/Hospitalization: No - Comment as needed  Activities of Daily Living Home Assistive Devices/Equipment: Other (Comment) (lift chair) ADL Screening (condition at time of admission) Patient's cognitive ability adequate to safely  complete daily activities?: Yes Is the patient deaf or have difficulty hearing?: No Does the patient have difficulty seeing, even when wearing glasses/contacts?: No Does the patient have difficulty concentrating, remembering, or making decisions?: No Patient able to express need for assistance with ADLs?: Yes Does the patient have difficulty dressing or bathing?: No Independently performs ADLs?: Yes (appropriate for developmental age) Does the patient have difficulty walking or climbing stairs?: Yes Weakness of Legs: Both Weakness of Arms/Hands: None  Permission Sought/Granted                  Emotional Assessment Appearance:: Appears stated age       Alcohol / Substance Use: Not Applicable Psych Involvement: No (comment)  Admission diagnosis:  Acute respiratory failure with hypoxia [J96.01] COPD with acute exacerbation [J44.1] Atrial fibrillation with RVR [I48.91] Community acquired pneumonia, unspecified laterality [J18.9] Patient Active Problem List   Diagnosis Date Noted   COPD with acute exacerbation ([place]) 04/07/2022   Acute HFrEF/systolic dysfunction CHF with EF 35 to 40% and regional wall motion normalities 6 04/07/2022   Acute respiratory failure with hypoxia ([place]) 04/06/2022   New onset a-fib ([place]) 04/06/2022   Encounter for annual general medical examination with abnormal findings in adult 12/03/2021   Back pain with left-sided radiculopathy 11/13/2021   Cough 11/13/2021   Head trauma 06/13/2021   Skin lesion of left arm 06/01/2021   Urinary frequency 05/18/2021   COVID-19 virus infection  09/30/2020   Thrombocytosis 08/25/2020   Personal history of tobacco use, presenting hazards to health 08/25/2020   Hx of breast cancer 08/25/2020   Low vitamin B12 level 08/22/2020   History of adenomatous polyp of colon 05/23/2020   Aortic atherosclerosis ([place]) 02/16/2020   Asthmatic bronchitis , chronic 11/19/2019   Cigarette smoker 11/19/2019   Prediabetes 06/23/2019    Muscle spasm 09/14/2018   Coronary artery calcification seen on CAT scan 11/09/2017   Colon adenomas 11/07/2017   Bloating symptom 11/07/2017   Allergic rhinitis 01/04/2015   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[place]) 09/26/2014   Acute bronchitis with COPD ([place]) 06/30/2014   Hydrosalpinx 09/28/2013   Osteopenia 07/15/2013   postmenopausal ovarian cyst 04/06/2013   Pulmonary nodules 04/04/2012   Breast cancer ([place]) 03/11/2012   Maxillary sinusitis, acute 09/13/2011   Ganglion cyst 05/08/2011   COPD (chronic obstructive pulmonary disease) with chronic bronchitis ([place]) 07/24/2009   Hyperlipidemia LDL goal <100 03/09/2009   CONSTIPATION, CHRONIC 03/09/2009   HTN (hypertension) 09/29/2008   GENERALIZED OSTEOARTHROSIS UNSPECIFIED SITE 09/29/2008   PCP:  [name], MD Pharmacy:   [facility] [place], [place] [address] Phone: [phone] Fax: [phone]     Social Determinants of Health (SDOH) Social History: SDOH Screenings   Food Insecurity: No Food Insecurity (04/07/2022)  Housing: Low Risk  (04/07/2022)  Transportation Needs: No Transportation Needs (04/07/2022)  Utilities: Not At Risk (04/07/2022)  Alcohol Screen: Low Risk  (02/19/2022)  Depression (PHQ2-9): Low Risk  (02/19/2022)  Financial Resource Strain: Low Risk  (02/19/2022)  Physical Activity: Insufficiently Active (02/19/2022)  Social Connections: Socially Isolated (02/19/2022)  Stress: No Stress Concern Present (02/19/2022)  Tobacco Use: High Risk (04/11/2022)   SDOH Interventions:     Readmission Risk Interventions    04/13/2022   11:11 AM  Readmission Risk Prevention Plan  Transportation Screening Complete  Home Care Screening Complete  Medication Review (RN CM) Complete

## 2022-04-13 NOTE — Evaluation (Signed)
Occupational Therapy Evaluation Patient Details Name: [name] MRN: [id number] DOB: 09/18/1945 Today's Date: 04/13/2022   History of Present Illness [name] is a 77 y.o. female with past medical history relevant for COPD/asthma in the setting of ongoing tobacco use, as well as history of left breast cancer, diabetes type 2 and hypertension admitted on 04/07/2022 with acute hypoxic and hypercapnic respiratory failure in the setting of community-acquired pneumonia/fibrillation ablation as well as new onset atrial fibrillation and low EF.   Clinical Impression   Pt agreeable to OT evaluation and participated well. Pt reports that she lives with her daughter and her grand daughter is her personal aid that provides assist daily. She reports that she required assist as needed for daily tasks but for the most part could dress herself. Pt completes bed mobility with min assist and completed sit to stand t/f with min assist and use of RW. Pt able to complete functional mobility across room to recliner chair. Pt O2 dropped to 84% throughout mobility and recovered to 93% while seated in chair. PT would benefit from continued TO services while in acute care setting.      Recommendations for follow up therapy are one component of a multi-disciplinary discharge planning process, led by the attending physician.  Recommendations may be updated based on patient status, additional functional criteria and insurance authorization.   Assistance Recommended at Discharge Intermittent Supervision/Assistance  Patient can return home with the following A little help with walking and/or transfers;A little help with bathing/dressing/bathroom;Assistance with cooking/housework    Functional Status Assessment  Patient has had a recent decline in their functional status and demonstrates the ability to make significant improvements in function in a reasonable and predictable amount of time.  Equipment  Recommendations  None recommended by OT           Precautions / Restrictions Precautions Precautions: Fall Restrictions Weight Bearing Restrictions: No      Mobility Bed Mobility Overal bed mobility: Needs Assistance Bed Mobility: Supine to Sit     Supine to sit: Min assist     General bed mobility comments: hand hold assist and rails to sit up    Transfers Overall transfer level: Needs assistance Equipment used: Rolling walker (2 wheels) Transfers: Sit to/from Stand Sit to Stand: Min assist           General transfer comment: labored movement, Min A and using RW for HHA      Balance Overall balance assessment: Needs assistance Sitting-balance support: Bilateral upper extremity supported, Feet supported Sitting balance-Leahy Scale: Good Sitting balance - Comments: at EOB   Standing balance support: Bilateral upper extremity supported, Reliant on assistive device for balance Standing balance-Leahy Scale: Fair Standing balance comment: using RW at bedside                           ADL either performed or assessed with clinical judgement   ADL Overall ADL's : Needs assistance/impaired Eating/Feeding: Set up;Bed level   Grooming: Set up;Sitting   Upper Body Bathing: Minimal assistance   Lower Body Bathing: Moderate assistance   Upper Body Dressing : Minimal assistance   Lower Body Dressing: Moderate assistance   Toilet Transfer: Moderate assistance;Rolling walker (2 wheels)   Toileting- Clothing Manipulation and Hygiene: Moderate assistance   Tub/ Shower Transfer: Moderate assistance;Rolling walker (2 wheels)   Functional mobility during ADLs: Minimal assistance;Rolling walker (2 wheels)       Vision Baseline Vision/History:  1 Wears glasses Ability to See in Adequate Light: 0 Adequate Patient Visual Report: No change from baseline                  Pertinent Vitals/Pain Pain Assessment Pain Assessment: No/denies pain      Hand Dominance Right   Extremity/Trunk Assessment Upper Extremity Assessment Upper Extremity Assessment: Overall WFL for tasks assessed   Lower Extremity Assessment Lower Extremity Assessment: Defer to PT evaluation   Cervical / Trunk Assessment Cervical / Trunk Assessment: Kyphotic   Communication Communication Communication: No difficulties   Cognition Arousal/Alertness: Awake/alert Behavior During Therapy: WFL for tasks assessed/performed Overall Cognitive Status: Within Functional Limits for tasks assessed                                                        Home Living Family/patient expects to be discharged to:: Private residence Living Arrangements: Children Available Help at Discharge: Family Type of Home: House Home Access: Stairs to enter Technical brewer of Steps: 4 Entrance Stairs-Rails: Left Home Layout: Two level;Able to live on main level with bedroom/bathroom     Bathroom Shower/Tub: Occupational psychologist: Standard Bathroom Accessibility: Yes   Home Equipment: None   Additional Comments: Patient reports previous IND ambualator and has no equipment in home      Prior Functioning/Environment Prior Level of Function : Needs assist       Physical Assist : ADLs (physical);Mobility (physical) Mobility (physical): Transfers;Stairs ADLs (physical): IADLs   ADLs Comments: Patient reports previous IND ambualator but Daughter assists with community ADLs and PRN in home tasks        OT Problem List: Decreased strength;Decreased activity tolerance      OT Treatment/Interventions: Self-care/ADL training;Therapeutic exercise;Therapeutic activities;Patient/family education    OT Goals(Current goals can be found in the care plan section) Acute Rehab OT Goals Patient Stated Goal: to return home OT Goal Formulation: With patient Time For Goal Achievement: 04/27/22 Potential to Achieve Goals: Good ADL  Goals Pt Will Perform Upper Body Bathing: sitting;with set-up Pt Will Perform Lower Body Dressing: with min assist;sitting/lateral leans Pt Will Transfer to Toilet: with min guard assist  OT Frequency: Min 2X/week                  AM-PAC OT "6 Clicks" Daily Activity     Outcome Measure Help from another person eating meals?: A Little Help from another person taking care of personal grooming?: A Little Help from another person toileting, which includes using toliet, bedpan, or urinal?: A Lot Help from another person bathing (including washing, rinsing, drying)?: A Lot Help from another person to put on and taking off regular upper body clothing?: A Little Help from another person to put on and taking off regular lower body clothing?: A Lot 6 Click Score: 15   End of Session Equipment Utilized During Treatment: Rolling walker (2 wheels);Oxygen Nurse Communication: Mobility status  Activity Tolerance: Patient tolerated treatment well Patient left: in chair;with call bell/phone within reach;with nursing/sitter in room  OT Visit Diagnosis: Unsteadiness on feet (R26.81);Muscle weakness (generalized) (M62.81)                Time: [id number] OT Time Calculation (min): 20 min Charges:  OT General Charges $OT Visit: 1 Visit OT Evaluation $OT Eval Low Complexity: 1  [place], OTR/L 04/13/2022, 11:17 AM

## 2022-04-14 DIAGNOSIS — J9601 Acute respiratory failure with hypoxia: Secondary | ICD-10-CM | POA: Diagnosis not present

## 2022-04-14 LAB — GLUCOSE, CAPILLARY
Glucose-Capillary: 187 mg/dL — ABNORMAL HIGH (ref 70–99)
Glucose-Capillary: 173 mg/dL — ABNORMAL HIGH (ref 70–99)
Glucose-Capillary: 168 mg/dL — ABNORMAL HIGH (ref 70–99)
Glucose-Capillary: 210 mg/dL — ABNORMAL HIGH (ref 70–99)

## 2022-04-14 LAB — BASIC METABOLIC PANEL
Anion gap: 6 (ref 5–15)
BUN: 42 mg/dL — ABNORMAL HIGH (ref 8–23)
CO2: 34 mmol/L — ABNORMAL HIGH (ref 22–32)
Calcium: 10.1 mg/dL (ref 8.9–10.3)
Chloride: 99 mmol/L (ref 98–111)
Creatinine, Ser: 0.94 mg/dL (ref 0.44–1.00)
GFR, Estimated: >60 mL/min (ref >60)
Glucose, Bld: 128 mg/dL — ABNORMAL HIGH (ref 70–99)
Potassium: 4.6 mmol/L (ref 3.5–5.1)
Sodium: 139 mmol/L (ref 135–145)

## 2022-04-14 LAB — CBC
HCT: 50.7 % — ABNORMAL HIGH (ref 36.0–46.0)
Hemoglobin: 15.6 g/dL — ABNORMAL HIGH (ref 12.0–15.0)
MCH: 28.1 pg (ref 26.0–34.0)
MCHC: 30.8 g/dL (ref 30.0–36.0)
MCV: 91.4 fL (ref 80.0–100.0)
Platelets: 470 10*3/uL — ABNORMAL HIGH (ref 150–400)
RBC: 5.55 MIL/uL — ABNORMAL HIGH (ref 3.87–5.11)
RDW: 14.8 % (ref 11.5–15.5)
WBC: 16.7 10*3/uL — ABNORMAL HIGH (ref 4.0–10.5)
nRBC: 0 % (ref 0.0–0.2)

## 2022-04-14 LAB — HEMOGLOBIN A1C
Hgb A1c MFr Bld: 6.5 % — ABNORMAL HIGH (ref 4.8–5.6)
Mean Plasma Glucose: 140 mg/dL

## 2022-04-14 LAB — BRAIN NATRIURETIC PEPTIDE: B Natriuretic Peptide: 155 pg/mL — ABNORMAL HIGH (ref 0.0–100.0)

## 2022-04-14 MED ORDER — INSULIN ASPART 100 UNIT/ML IJ SOLN
5.0000 [IU] | Freq: Once | INTRAMUSCULAR | Status: AC
  Administered 2022-04-14: 5 [IU] via SUBCUTANEOUS

## 2022-04-14 NOTE — Progress Notes (Signed)
Vitals stable, pt slept through the night. No c/o pain noted.

## 2022-04-14 NOTE — Progress Notes (Signed)
PROGRESS NOTE    Patient: [name]                            PCP: [name], MD                    DOB: 08-23-1945            DOA: 04/06/2022 [id number]             DOS: 04/14/2022, 10:28 AM   LOS: 7 days   Date of Service: The patient was seen and examined on 04/14/2022  Subjective:   The patient was seen and examined this morning, stable sitting up in bed having breakfast no acute distress, complain of shortness of with exertion, but at rest satting 3% on 3 L of oxygen  Amiodarone has been switched to p.o., still on heparin drip,  Brief Narrative:   Brief Narrative:  [name] is a 77 y.o. female with past medical history relevant for COPD/asthma in the setting of ongoing tobacco use, as well as history of left breast cancer, diabetes type 2 and hypertension admitted on 04/07/2022 with acute hypoxic and hypercapnic respiratory failure in the setting of community-acquired pneumonia/fibrillation ablation as well as new onset atrial fibrillation and low EF.  Assessment and Plan:     1)Acute COPD exacerbation/tobacco abuse -acute respiratory failure -- Continue to improve currently on 3 L of oxygen, satting 93%  Obtaining CT angiogram: Negative for pulmonary embolism, CAD, small to moderate left pleural effusion, patchy infiltrate in both lower lobes ?  Interstitial pneumonia  --Likely secondary to respiratory infection - -continue nebs, tapering down steroids, de-escalating antibiotics -Pulmonary were following closely   2)Presumed CAP-- -proving - POA: productive cough with colored sputum, low-grade fevers and leukocytosis --Chest x-ray suggestive of possible atypical/viral pneumonia,  -Procalcitonin 0.26 -COVID, RSV and influenza negative -Respiratory culture NGTD -Acute on chronic WBC is 19.6 >>> 12.0 >>> 10.9, 10.7>>17.8 >> 16.7 (on steroids) -De-escalating antibiotics, switch to p.o. azithromycin   3)New onset A-fib with RVR- -heart rate  stable, 89 this a.m. -Following p.o. amiodarone 200 mg twice daily -Off heparin drip switched to p.o. Eliquis  -Troponin 50 >> 50 >> 17 -TSH WNL  -Cardiology were following closely Recommended to continue Bisoprolo and PO amiodarone, Eliquis, Entresto 24/26 mg twice a day-continue Aldactone, discontinued IV Lasix     4) acute hypoxic and hypercapnic respiratory failure--- due to #1, #2 #3 above -Improved on 3 L of oxygen, satting 93%  --POA - in the ED patient is found to be hypoxic with O2 sats of 84% on room air and 90% on 2 L with productive   -Hypoxia persisted -POA developed encephalopathy due to hypoxia -resolved  POA: uncompensated respiratory acidosis due to hypercapnia with a pH of 7.18 and pCO2 of 82 - was on BiPAP 04/08/2022 -tapered off now on oxygen via nasal cannula -Repeat ABG as needed   - CTA of the chest today reviewed negative for pulmonary embolism     5)HFrEF-----acute systolic dysfunction CHF- Filed Weights   04/06/22 1302 04/07/22 1104 04/08/22 0500  Weight: 72.6 kg 73.7 kg 73.7 kg    Intake/Output Summary (Last 24 hours) at 04/13/2022 1149 Last data filed at 04/13/2022 1000 Gross per 24 hour  Intake 120 ml  Output 2150 ml  Net -2030 ml  continue Bisoprolo and IV amiodarone added Entresto 24/26 mg twice daily today 04/10/2022 To continue Aldactone  a -Continue IV Lasix   --Troponin 50 >> 50 >> 17 -- REDs: 23 now   Echo with EF of 35 to 40% with regional wall motion abnormalities--The inferior wall, mid anteroseptal segment, mid inferoseptal segment, and basal inferoseptal segment are hypokinetic  --Tachycardia induced cardiomyopathy with low EF Versus ischemic cardiomyopathy -Per cardiology recommendation continue:  Bisoprolol, spironolactone 50 mg daily, aspirin, Entresto, Aldactone, Crestor, -Lasix-discontinued 04/13/2022  03/14/22 - start farxiga 10mg  daily.   Monitoring BUN/creatinine closely  -Appreciate follow-up with cardiology with  recommendations    6)HTN--continue Bisoprolo and PO amiodarone added Entresto 24/26 mg twice daily today 04/10/2022 To continue Aldactone    7)DM2-recent A1c 6.3% reflecting excellent diabetic control PTA -Hyperglycemic on steroids Use Novolog/Humalog Sliding scale insulin with Accu-Cheks/Fingersticks as ordered    8)Social/Ethics--- continue discussion with patient's granddaughter and daughter  [name] and granddaughter [name] who is patient's primary caregiver-full scope of care,   -Patient is a full code without limitations to treatment at this point   At this point patient's prognosis is poor due to acute respiratory failure due to pneumonia, and A-fib with RVR, labile vitals, significantly reduced heart failure new onset.   Consulting palliative care team -following   ------------------------------------------------------------------------------------------------------------------------- Nutritional status:  The patient's BMI is: Body mass index is 27.04 kg/m. I agree with the assessment and plan as outlined below: Nutrition Status:      ------------------------------------------------------------------------------------------------------------------------ Cultures; Blood Cultures x 2 >> NGT Sputum Culture >> NGT  -------------------------------------------------------------------------------------------------------------------------  Consults: PCCM/cardiology/palliative care  DVT prophylaxis:  SCDs Start: 04/06/22 1655 Place TED hose Start: 04/06/22 1655 apixaban (ELIQUIS) tablet 5 mg   Code Status:   Code Status: Full Code  Family Communication: No family member present at bedside- attempt will be made to update daily POA daughter [name] at [phone] The above findings and plan of care has been discussed with patient and family  in detail,  they expressed understanding and agreement of above. -Advance care planning has been  discussed.   Admission status:   Status is: Inpatient Remains inpatient appropriate because: Needing close monitoring and trending in intensive care unit for acute respiratory failure, A-fib with RVR   Disposition: From  - home             Planning for discharge in 2 days: to home with home health  Procedures:   No admission procedures for hospital encounter.   Antimicrobials:  Anti-infectives (From admission, onward)    Start     Dose/Rate Route Frequency Ordered Stop   04/12/22 1115  azithromycin (ZITHROMAX) tablet 500 mg        500 mg Oral Daily 04/12/22 1019 04/15/22 2359   04/07/22 1400  cefTRIAXone (ROCEPHIN) 2 g in sodium chloride 0.9 % 100 mL IVPB        2 g 200 mL/hr over 30 Minutes Intravenous Every 24 hours 04/06/22 1421 04/12/22 1442   04/07/22 1400  azithromycin (ZITHROMAX) 500 mg in sodium chloride 0.9 % 250 mL IVPB  Status:  Discontinued        500 mg 250 mL/hr over 60 Minutes Intravenous Every 24 hours 04/06/22 1421 04/06/22 1516   04/07/22 1400  doxycycline (VIBRAMYCIN) 100 mg in sodium chloride 0.9 % 250 mL IVPB  Status:  Discontinued        100 mg 125 mL/hr over 120 Minutes Intravenous Every 12 hours 04/06/22 1514 04/12/22 1019   04/06/22 1330  levofloxacin (LEVAQUIN) IVPB 750 mg  750 mg 100 mL/hr over 90 Minutes Intravenous  Once 04/06/22 1328 04/06/22 1438        Medication:   acidophilus  2 capsule Oral TID   amiodarone  200 mg Oral BID   apixaban  5 mg Oral BID   azithromycin  500 mg Oral Daily   bisoprolol  5 mg Oral Daily   calcium-vitamin D  1 tablet Oral BID   Chlorhexidine Gluconate Cloth  6 each Topical Daily   cyanocobalamin  500 mcg Oral Daily   cyclobenzaprine  5 mg Oral QHS   dapagliflozin propanediol  10 mg Oral Daily   guaiFENesin  1,200 mg Oral BID   insulin aspart  0-6 Units Subcutaneous TID WC   ipratropium-albuterol  3 mL Nebulization TID   methylPREDNISolone (SOLU-MEDROL) injection  20 mg Intravenous Q24H    mometasone-formoterol  2 puff Inhalation BID   polyethylene glycol  17 g Oral Daily   rosuvastatin  10 mg Oral Daily   sacubitril-valsartan  1 tablet Oral BID   sodium chloride flush  3 mL Intravenous Q12H   spironolactone  25 mg Oral Daily    acetaminophen **OR** acetaminophen, albuterol, bisacodyl, levalbuterol, ondansetron **OR** ondansetron (ZOFRAN) IV, mouth rinse, pantoprazole, polyethylene glycol, traMADol, traZODone   Objective:   Vitals:   04/14/22 0323 04/14/22 0722 04/14/22 0845 04/14/22 0921  BP: 107/64 128/65 105/61   Pulse: 92 88 89   Resp: 14     Temp: 97.9 F (36.6 C) 98 F (36.7 C)    TempSrc: Oral Oral    SpO2: 98% 95%  93%  Weight:      Height:        Intake/Output Summary (Last 24 hours) at 04/14/2022 1028 Last data filed at 04/14/2022 0548 Gross per 24 hour  Intake 450 ml  Output 750 ml  Net -300 ml   Filed Weights   04/06/22 1302 04/07/22 1104 04/08/22 0500  Weight: 72.6 kg 73.7 kg 73.7 kg     Physical examination:    General:  AAO x 3,  cooperative, no distress;   HEENT:  Normocephalic, PERRL, otherwise with in Normal limits   Neuro:  CNII-XII intact. , normal motor and sensation, reflexes intact   Lungs:   Clear to auscultation BL, Respirations unlabored,  No wheezes / crackles  Cardio:    Irregularly irregular, no murmure, No Rubs or Gallops   Abdomen:  Soft, non-tender, bowel sounds active all four quadrants, no guarding or peritoneal signs.  Muscular  skeletal:  Limited exam -global generalized weaknesses - in bed, able to move all 4 extremities,   2+ pulses,  symmetric, No pitting edema  Skin:  Dry, warm to touch, negative for any Rashes,  Wounds: Please see nursing documentation              ------------------------------------------------------------------------------------------------------------------------------------------    LABs:     Latest Ref Rng & Units 04/14/2022    5:12 AM 04/13/2022    4:11 AM 04/12/2022     4:09 AM  CBC  WBC 4.0 - 10.5 K/uL 16.7  17.8  14.9   Hemoglobin 12.0 - 15.0 g/dL 15.6  15.6  14.8   Hematocrit 36.0 - 46.0 % 50.7  49.1  45.9   Platelets 150 - 400 K/uL 470  457  408       Latest Ref Rng & Units 04/14/2022    5:12 AM 04/13/2022    6:49 AM 04/12/2022    4:09 AM  CMP  Glucose 70 -  99 mg/dL 128  119  150   BUN 8 - 23 mg/dL 42  33  33   Creatinine 0.44 - 1.00 mg/dL 0.94  0.85  0.75   Sodium 135 - 145 mmol/L 139  139  137   Potassium 3.5 - 5.1 mmol/L 4.6  4.3  3.7   Chloride 98 - 111 mmol/L 99  100  99   CO2 22 - 32 mmol/L 34  32  29   Calcium 8.9 - 10.3 mg/dL 10.1  9.9  9.6        Micro Results   Radiology Reports No results found.  SIGNED: Deatra James, MD, FHM. FAAFP. [facility]ac[facility] Pontes - [facility] Time spent > 77 min.  Of critical care time was spent in seeing, evaluating and examining the patient. Reviewing medical records, labs, drawn plan of care. [facility],  Pager (please use [email or web address] to page/ text) Please use Epic Secure Chat for non-urgent communication (7AM-7PM)  If 7PM-7AM, please contact night-coverage [email or web address], 04/14/2022, 10:28 AM

## 2022-04-15 DIAGNOSIS — J9601 Acute respiratory failure with hypoxia: Secondary | ICD-10-CM | POA: Diagnosis not present

## 2022-04-15 DIAGNOSIS — E118 Type 2 diabetes mellitus with unspecified complications: Secondary | ICD-10-CM | POA: Diagnosis not present

## 2022-04-15 LAB — CBC
HCT: 48.7 % — ABNORMAL HIGH (ref 36.0–46.0)
Hemoglobin: 15.3 g/dL — ABNORMAL HIGH (ref 12.0–15.0)
MCH: 28.5 pg (ref 26.0–34.0)
MCHC: 31.4 g/dL (ref 30.0–36.0)
MCV: 90.7 fL (ref 80.0–100.0)
Platelets: 460 10*3/uL — ABNORMAL HIGH (ref 150–400)
RBC: 5.37 MIL/uL — ABNORMAL HIGH (ref 3.87–5.11)
RDW: 15.2 % (ref 11.5–15.5)
WBC: 16.5 10*3/uL — ABNORMAL HIGH (ref 4.0–10.5)
nRBC: 0 % (ref 0.0–0.2)

## 2022-04-15 LAB — GLUCOSE, CAPILLARY
Glucose-Capillary: 156 mg/dL — ABNORMAL HIGH (ref 70–99)
Glucose-Capillary: 226 mg/dL — ABNORMAL HIGH (ref 70–99)
Glucose-Capillary: 194 mg/dL — ABNORMAL HIGH (ref 70–99)
Glucose-Capillary: 162 mg/dL — ABNORMAL HIGH (ref 70–99)

## 2022-04-15 LAB — BASIC METABOLIC PANEL
Anion gap: 7 (ref 5–15)
BUN: 37 mg/dL — ABNORMAL HIGH (ref 8–23)
CO2: 33 mmol/L — ABNORMAL HIGH (ref 22–32)
Calcium: 10.1 mg/dL (ref 8.9–10.3)
Chloride: 100 mmol/L (ref 98–111)
Creatinine, Ser: 0.81 mg/dL (ref 0.44–1.00)
GFR, Estimated: >60 mL/min (ref >60)
Glucose, Bld: 98 mg/dL (ref 70–99)
Potassium: 4.9 mmol/L (ref 3.5–5.1)
Sodium: 140 mmol/L (ref 135–145)

## 2022-04-15 LAB — BRAIN NATRIURETIC PEPTIDE: B Natriuretic Peptide: 115 pg/mL — ABNORMAL HIGH (ref 0.0–100.0)

## 2022-04-15 MED ORDER — SACUBITRIL-VALSARTAN 24-26 MG PO TABS: 1.0000 | ORAL_TABLET | Freq: Two times a day (BID) | ORAL | 1 refills | Status: DC

## 2022-04-15 MED ORDER — DAPAGLIFLOZIN PROPANEDIOL 10 MG PO TABS: 10.0000 mg | ORAL_TABLET | Freq: Every day | ORAL | 1 refills | Status: DC

## 2022-04-15 MED ORDER — PREDNISONE 20 MG PO TABS: ORAL_TABLET | ORAL | 0 refills | Status: DC

## 2022-04-15 MED ORDER — BISOPROLOL FUMARATE 5 MG PO TABS: 5.0000 mg | ORAL_TABLET | Freq: Every day | ORAL | 1 refills | Status: DC

## 2022-04-15 MED ORDER — SPIRONOLACTONE 25 MG PO TABS: 25.0000 mg | ORAL_TABLET | Freq: Every day | ORAL | 1 refills | Status: DC

## 2022-04-15 MED ORDER — APIXABAN 5 MG PO TABS: 5.0000 mg | ORAL_TABLET | Freq: Two times a day (BID) | ORAL | 2 refills | Status: DC

## 2022-04-15 MED ORDER — GUAIFENESIN ER 600 MG PO TB12: 1200.0000 mg | ORAL_TABLET | Freq: Two times a day (BID) | ORAL | 0 refills | Status: AC

## 2022-04-15 MED ORDER — AMIODARONE HCL 200 MG PO TABS: ORAL_TABLET | ORAL | 0 refills | Status: DC

## 2022-04-15 MED ORDER — PREDNISONE 20 MG PO TABS
20.0000 mg | ORAL_TABLET | Freq: Every day | ORAL | Status: DC
  Administered 2022-04-15: 20 mg via ORAL
  Filled 2022-04-15: qty 1

## 2022-04-15 NOTE — Progress Notes (Signed)
PROGRESS NOTE    Patient: [name]                            PCP: [name], MD                    DOB: 06/16/1945            DOA: 04/06/2022 [id number]             DOS: 04/15/2022, 10:40 AM   LOS: 8 days   Date of Service: The patient was seen and examined on 04/15/2022  Subjective:   The patient was seen and examined this morning, stable, sitting up in bed, having breakfast, Nursing staff reported satting 86% on room air, > 92% on 3 L of oxygen  Brief Narrative:   Brief Narrative:  [name] is a 77 y.o. female with past medical history relevant for COPD/asthma in the setting of ongoing tobacco use, as well as history of left breast cancer, diabetes type 2 and hypertension admitted on 04/07/2022 with acute hypoxic and hypercapnic respiratory failure in the setting of community-acquired pneumonia/fibrillation ablation as well as new onset atrial fibrillation and low EF.  Assessment and Plan:     1)Acute COPD exacerbation/tobacco abuse -acute respiratory failure -- Staff reported satting 86% on room air, > 91% on 3 L of oxygen  Obtaining CT angiogram: Negative for pulmonary embolism, CAD, small to moderate left pleural effusion, patchy infiltrate in both lower lobes ?  Interstitial pneumonia  --Likely secondary to respiratory infection - -continue nebs, tapering down steroids, de-escalating antibiotics -Pulmonary were following closely   2)Presumed CAP-- -much improved - POA: productive cough with colored sputum, low-grade fevers and leukocytosis --Chest x-ray suggestive of possible atypical/viral pneumonia,  -Procalcitonin 0.26 -COVID, RSV and influenza negative -Respiratory culture NGTD -Acute on chronic WBC is 19.6 >>> 12.0 >>> 10.9 >>17.8 >> 16.7 (>> 16.5 on steroids) -Aggressively with IV antibiotics and switch to p.o. azithromycin completed course 04/15/2022   3)New onset A-fib with RVR- -heart rate stabilized -Continue regimen amiodarone 200  mg p.o. twice daily, Eliquis, (Status post treatment IV heparin, IV amiodarone)  -Troponin 50 >> 50 >> 17 -TSH WNL  -Cardiology were following closely Recommended to continue Bisoprolo and PO amiodarone, Eliquis, Entresto 24/26 mg twice a day-continue Aldactone, discontinued IV Lasix     4) acute hypoxic and hypercapnic respiratory failure--- due to #1, #2 #3 above -O2 sat> 91% on 3 L of oxygen -Likely will need home O2  --POA - in the ED patient is found to be hypoxic with O2 sats of 84% on room air and 90% on 2 L with productive   -Hypoxia persisted -POA developed encephalopathy due to hypoxia -resolved  POA: uncompensated respiratory acidosis due to hypercapnia with a pH of 7.18 and pCO2 of 82 - was on BiPAP 04/08/2022 -tapered off now on oxygen via nasal cannula -Repeat ABG as needed   - CTA of the chest today reviewed negative for pulmonary embolism     5)HFrEF-----acute systolic dysfunction CHF- Filed Weights   04/06/22 1302 04/07/22 1104 04/08/22 0500  Weight: 72.6 kg 73.7 kg 73.7 kg    Intake/Output Summary (Last 24 hours) at 04/15/2022 1035 Last data filed at 04/15/2022 0900 Gross per 24 hour  Intake 480 ml  Output 1050 ml  Net -570 ml  continue Bisoprolo and IV amiodarone added Entresto 24/26 mg twice daily today 04/10/2022 To continue  Aldactone a -D/Ced  IV Lasix   --Troponin 50 >> 50 >> 17 -- REDs: 23 now   Echo with EF of 35 to 40% with regional wall motion abnormalities--The inferior wall, mid anteroseptal segment, mid inferoseptal segment, and basal inferoseptal segment are hypokinetic  --Tachycardia induced cardiomyopathy with low EF Versus ischemic cardiomyopathy -Per cardiology recommendation continue:  Bisoprolol, spironolactone 50 mg daily, aspirin, Entresto, Aldactone, Crestor, -Lasix-discontinued 04/13/2022  03/14/22 - start farxiga 10mg  daily.   Monitoring BUN/creatinine closely  -Appreciate follow-up with cardiology with recommendations     6)HTN-- Stable  continue Bisoprolo +  Amiodarone +  Entresto 24/26 mg twice daily + Aldactone    7)DM2-recent A1c 6.3% reflecting excellent diabetic control PTA -Hyperglycemic on steroids Use Novolog/Humalog Sliding scale insulin with Accu-Cheks/Fingersticks as ordered    8)Social/Ethics--- continue discussion with patient's granddaughter and daughter  [name] and granddaughter [name] who is patient's primary caregiver-full scope of care,   -Patient is a full code without limitations to treatment at this point   At this point patient's prognosis is poor due to acute respiratory failure due to pneumonia, and A-fib with RVR, labile vitals, significantly reduced heart failure new onset.   Consulting palliative care team -following   ------------------------------------------------------------------------------------------------------------------------- Nutritional status:  The patient's BMI is: Body mass index is 27.04 kg/m. I agree with the assessment and plan as outlined below: Nutrition Status:      ------------------------------------------------------------------------------------------------------------------------ Cultures; Blood Cultures x 2 >> NGT Sputum Culture >> NGT  -------------------------------------------------------------------------------------------------------------------------  Consults: PCCM/cardiology/palliative care  DVT prophylaxis:  SCDs Start: 04/06/22 1655 Place TED hose Start: 04/06/22 1655 apixaban (ELIQUIS) tablet 5 mg   Code Status:   Code Status: Full Code  Family Communication: No family member present at bedside- attempt will be made to update daily POA daughter [name] at [phone] The above findings and plan of care has been discussed with patient and family  in detail,  they expressed understanding and agreement of above. -Advance care planning has been discussed.   Admission status:   Status is:  Inpatient Remains inpatient appropriate because: Needing close monitoring and trending in intensive care unit for acute respiratory failure, A-fib with RVR   Disposition: From  - home             Planning for discharge in 1 days: to home with home health  Procedures:   No admission procedures for hospital encounter.   Antimicrobials:  Anti-infectives (From admission, onward)    Start     Dose/Rate Route Frequency Ordered Stop   04/12/22 1115  azithromycin (ZITHROMAX) tablet 500 mg  Status:  Discontinued        500 mg Oral Daily 04/12/22 1019 04/15/22 1032   04/07/22 1400  cefTRIAXone (ROCEPHIN) 2 g in sodium chloride 0.9 % 100 mL IVPB        2 g 200 mL/hr over 30 Minutes Intravenous Every 24 hours 04/06/22 1421 04/12/22 1442   04/07/22 1400  azithromycin (ZITHROMAX) 500 mg in sodium chloride 0.9 % 250 mL IVPB  Status:  Discontinued        500 mg 250 mL/hr over 60 Minutes Intravenous Every 24 hours 04/06/22 1421 04/06/22 1516   04/07/22 1400  doxycycline (VIBRAMYCIN) 100 mg in sodium chloride 0.9 % 250 mL IVPB  Status:  Discontinued        100 mg 125 mL/hr over 120 Minutes Intravenous Every 12 hours 04/06/22 1514 04/12/22 1019   04/06/22 1330  levofloxacin (LEVAQUIN) IVPB 750 mg  750 mg 100 mL/hr over 90 Minutes Intravenous  Once 04/06/22 1328 04/06/22 1438        Medication:   acidophilus  2 capsule Oral TID   amiodarone  200 mg Oral BID   apixaban  5 mg Oral BID   bisoprolol  5 mg Oral Daily   calcium-vitamin D  1 tablet Oral BID   Chlorhexidine Gluconate Cloth  6 each Topical Daily   cyanocobalamin  500 mcg Oral Daily   cyclobenzaprine  5 mg Oral QHS   dapagliflozin propanediol  10 mg Oral Daily   guaiFENesin  1,200 mg Oral BID   insulin aspart  0-6 Units Subcutaneous TID WC   ipratropium-albuterol  3 mL Nebulization TID   mometasone-formoterol  2 puff Inhalation BID   polyethylene glycol  17 g Oral Daily   predniSONE  20 mg Oral Q breakfast   rosuvastatin   10 mg Oral Daily   sacubitril-valsartan  1 tablet Oral BID   sodium chloride flush  3 mL Intravenous Q12H   spironolactone  25 mg Oral Daily    acetaminophen **OR** acetaminophen, albuterol, bisacodyl, levalbuterol, ondansetron **OR** ondansetron (ZOFRAN) IV, mouth rinse, pantoprazole, polyethylene glycol, traMADol, traZODone   Objective:   Vitals:   04/14/22 1920 04/14/22 1939 04/15/22 0448 04/15/22 0741  BP: 123/69  121/86   Pulse: 90  89   Resp: 20  20   Temp: (!) 97.4 F (36.3 C)  97.7 F (36.5 C)   TempSrc: Oral  Oral   SpO2: 98% 94% 91% (!) 86%  Weight:      Height:        Intake/Output Summary (Last 24 hours) at 04/15/2022 1040 Last data filed at 04/15/2022 0900 Gross per 24 hour  Intake 480 ml  Output 1050 ml  Net -570 ml   Filed Weights   04/06/22 1302 04/07/22 1104 04/08/22 0500  Weight: 72.6 kg 73.7 kg 73.7 kg     Physical examination:    General:  AAO x 3,  cooperative, no distress;  mildly cachectic, frail elderly female  HEENT:  Normocephalic, PERRL, otherwise with in Normal limits   Neuro:  CNII-XII intact. , normal motor and sensation, reflexes intact   Lungs:   Clear to auscultation BL, Respirations unlabored,  No wheezes / crackles  Cardio:    Irregularly irregular, no murmure, No Rubs or Gallops   Abdomen:  Soft, non-tender, bowel sounds active all four quadrants, no guarding or peritoneal signs.  Muscular  skeletal:  Limited exam -sever global generalized weaknesses - in bed, able to move all 4 extremities,   2+ pulses,  symmetric, No pitting edema  Skin:  Dry, warm to touch, negative for any Rashes,  Wounds: Please see nursing documentation               ------------------------------------------------------------------------------------------------------------------------------------------    LABs:     Latest Ref Rng & Units 04/15/2022    4:05 AM 04/14/2022    5:12 AM 04/13/2022    4:11 AM  CBC  WBC 4.0 - 10.5 K/uL 16.5   16.7  17.8   Hemoglobin 12.0 - 15.0 g/dL 15.3  15.6  15.6   Hematocrit 36.0 - 46.0 % 48.7  50.7  49.1   Platelets 150 - 400 K/uL 460  470  457       Latest Ref Rng & Units 04/15/2022    4:05 AM 04/14/2022    5:12 AM 04/13/2022    6:49 AM  CMP  Glucose 70 -  99 mg/dL 98  128  119   BUN 8 - 23 mg/dL 37  42  33   Creatinine 0.44 - 1.00 mg/dL 0.81  0.94  0.85   Sodium 135 - 145 mmol/L 140  139  139   Potassium 3.5 - 5.1 mmol/L 4.9  4.6  4.3   Chloride 98 - 111 mmol/L 100  99  100   CO2 22 - 32 mmol/L 33  34  32   Calcium 8.9 - 10.3 mg/dL 10.1  10.1  9.9      Radiology Reports No results found.  SIGNED: Deatra James, MD, FHM. FAAFP. [facility] - [facility] Time spent , 55 min.  time was spent in seeing, evaluating and examining the patient. Reviewing medical records, labs, drawn plan of care. [facility],  Pager (please use [email or web address] to page/ text) Please use Epic Secure Chat for non-urgent communication (7AM-7PM)  If 7PM-7AM, please contact night-coverage [email or web address], 04/15/2022, 10:40 AM

## 2022-04-16 ENCOUNTER — Inpatient Hospital Stay (HOSPITAL_COMMUNITY): Payer: 59

## 2022-04-16 DIAGNOSIS — Z515 Encounter for palliative care: Secondary | ICD-10-CM | POA: Diagnosis not present

## 2022-04-16 DIAGNOSIS — I4892 Unspecified atrial flutter: Secondary | ICD-10-CM

## 2022-04-16 DIAGNOSIS — I255 Ischemic cardiomyopathy: Secondary | ICD-10-CM

## 2022-04-16 DIAGNOSIS — E7849 Other hyperlipidemia: Secondary | ICD-10-CM

## 2022-04-16 DIAGNOSIS — Z7189 Other specified counseling: Secondary | ICD-10-CM | POA: Diagnosis not present

## 2022-04-16 DIAGNOSIS — J9601 Acute respiratory failure with hypoxia: Secondary | ICD-10-CM | POA: Diagnosis not present

## 2022-04-16 DIAGNOSIS — I480 Paroxysmal atrial fibrillation: Secondary | ICD-10-CM

## 2022-04-16 DIAGNOSIS — J449 Chronic obstructive pulmonary disease, unspecified: Secondary | ICD-10-CM

## 2022-04-16 LAB — CBC
HCT: 49.3 % — ABNORMAL HIGH (ref 36.0–46.0)
Hemoglobin: 15.6 g/dL — ABNORMAL HIGH (ref 12.0–15.0)
MCH: 28.4 pg (ref 26.0–34.0)
MCHC: 31.6 g/dL (ref 30.0–36.0)
MCV: 89.8 fL (ref 80.0–100.0)
Platelets: 464 10*3/uL — ABNORMAL HIGH (ref 150–400)
RBC: 5.49 MIL/uL — ABNORMAL HIGH (ref 3.87–5.11)
RDW: 15.1 % (ref 11.5–15.5)
WBC: 23 10*3/uL — ABNORMAL HIGH (ref 4.0–10.5)
nRBC: 0 % (ref 0.0–0.2)

## 2022-04-16 LAB — LACTIC ACID, PLASMA: Lactic Acid, Venous: 1.8 mmol/L (ref 0.5–1.9)

## 2022-04-16 LAB — URINALYSIS, ROUTINE W REFLEX MICROSCOPIC
Bilirubin Urine: NEGATIVE
Glucose, UA: >=500 mg/dL — AB
Hgb urine dipstick: NEGATIVE
Ketones, ur: NEGATIVE mg/dL
Leukocytes,Ua: NEGATIVE
Nitrite: NEGATIVE
Protein, ur: NEGATIVE mg/dL
Specific Gravity, Urine: 1.017 (ref 1.005–1.030)
pH: 7 (ref 5.0–8.0)

## 2022-04-16 LAB — BASIC METABOLIC PANEL
Anion gap: 8 (ref 5–15)
BUN: 38 mg/dL — ABNORMAL HIGH (ref 8–23)
CO2: 30 mmol/L (ref 22–32)
Calcium: 10.1 mg/dL (ref 8.9–10.3)
Chloride: 98 mmol/L (ref 98–111)
Creatinine, Ser: 0.93 mg/dL (ref 0.44–1.00)
GFR, Estimated: >60 mL/min (ref >60)
Glucose, Bld: 128 mg/dL — ABNORMAL HIGH (ref 70–99)
Potassium: 5.3 mmol/L — ABNORMAL HIGH (ref 3.5–5.1)
Sodium: 136 mmol/L (ref 135–145)

## 2022-04-16 LAB — GLUCOSE, CAPILLARY
Glucose-Capillary: 98 mg/dL (ref 70–99)
Glucose-Capillary: 123 mg/dL — ABNORMAL HIGH (ref 70–99)
Glucose-Capillary: 124 mg/dL — ABNORMAL HIGH (ref 70–99)
Glucose-Capillary: 150 mg/dL — ABNORMAL HIGH (ref 70–99)

## 2022-04-16 LAB — PROCALCITONIN: Procalcitonin: <0.1 ng/mL

## 2022-04-16 LAB — BRAIN NATRIURETIC PEPTIDE: B Natriuretic Peptide: 131 pg/mL — ABNORMAL HIGH (ref 0.0–100.0)

## 2022-04-16 MED ORDER — CYCLOBENZAPRINE HCL 5 MG PO TABS: 5.0000 mg | ORAL_TABLET | Freq: Three times a day (TID) | ORAL | 0 refills | Status: DC | PRN

## 2022-04-16 MED ORDER — SPIRONOLACTONE 25 MG PO TABS: 25.0000 mg | ORAL_TABLET | Freq: Every day | ORAL | Status: DC

## 2022-04-16 MED ORDER — IPRATROPIUM-ALBUTEROL 0.5-2.5 (3) MG/3ML IN SOLN
3.0000 mL | Freq: Two times a day (BID) | RESPIRATORY_TRACT | Status: DC
  Administered 2022-04-16 – 2022-04-17 (×2): 3 mL via RESPIRATORY_TRACT
  Filled 2022-04-16 (×2): qty 3

## 2022-04-16 MED ORDER — SPIRONOLACTONE 25 MG PO TABS: 25.0000 mg | ORAL_TABLET | Freq: Every day | ORAL | 0 refills | Status: DC

## 2022-04-16 MED ORDER — AZITHROMYCIN 250 MG PO TABS
500.0000 mg | ORAL_TABLET | Freq: Every day | ORAL | Status: DC
  Administered 2022-04-16 – 2022-04-17 (×2): 500 mg via ORAL
  Filled 2022-04-16 (×2): qty 2

## 2022-04-16 NOTE — Progress Notes (Signed)
Patient will need BSC. The beneficiary is confined to a single room.

## 2022-04-16 NOTE — Progress Notes (Signed)
Palliative: [name] is sitting up in the [name] chair in her room.  She appears chronically ill and somewhat frail.  She greets me, making and somewhat keeping eye contact.  She is alert and oriented x 3, unable to make her needs known.  There is no family at bedside at this time.  We talk about her improvements, being out of the intensive care and on room air.  She tells me that she feels that she is better.  She shares that she expects to go home tomorrow.  She shares that her granddaughter, [name], is her Medical sales representative 40 hours/week.  We talked about HCPOA, [name] and [name].  We also talk about CODE STATUS.  [name] shares that she is still considering her options.  We talked about outpatient palliative services to continue CODE STATUS discussions while at home.  Granddaughter [name] is agreeable to outpatient palliative services, provider chosen.  Conference with attending, bedside nursing staff, transition of care team related to patient condition, needs, goals of care, disposition.  Plan: At this point continue full scope/full code.  Home with home health if needed.  Outpatient palliative services through [place].  54  minutes  Tanya Axe, NP Palliative medicine team Team phone [phone] Greater than 50% of this time was spent counseling and coordinating care related to the above assessment and plan

## 2022-04-16 NOTE — Progress Notes (Signed)
Patient assisted to bathroom using front wheel walker,  very unsteady, weak,and with tremors.Out of bed to chair this am. Call bell in reach. Plan of care on going.

## 2022-04-16 NOTE — Discharge Instructions (Signed)

## 2022-04-16 NOTE — Progress Notes (Addendum)
Progress Note  Patient Name: [name] Date of Encounter: 04/16/2022  Primary Cardiologist: Carlyle Dolly, MD  Subjective   No acute events overnight. No symptoms. Not on nasal cannula oxygen when I went to see her. She says that she wears oxygen only when she feels like it is needed.  Inpatient Medications    Scheduled Meds:  acidophilus  2 capsule Oral TID   amiodarone  200 mg Oral BID   apixaban  5 mg Oral BID   azithromycin  500 mg Oral Daily   bisoprolol  5 mg Oral Daily   calcium-vitamin D  1 tablet Oral BID   Chlorhexidine Gluconate Cloth  6 each Topical Daily   cyanocobalamin  500 mcg Oral Daily   cyclobenzaprine  5 mg Oral QHS   dapagliflozin propanediol  10 mg Oral Daily   guaiFENesin  1,200 mg Oral BID   insulin aspart  0-6 Units Subcutaneous TID WC   ipratropium-albuterol  3 mL Nebulization TID   mometasone-formoterol  2 puff Inhalation BID   polyethylene glycol  17 g Oral Daily   rosuvastatin  10 mg Oral Daily   sacubitril-valsartan  1 tablet Oral BID   sodium chloride flush  3 mL Intravenous Q12H   [START ON 04/17/2022] spironolactone  25 mg Oral Daily   Continuous Infusions:  PRN Meds: acetaminophen **OR** acetaminophen, albuterol, bisacodyl, levalbuterol, ondansetron **OR** ondansetron (ZOFRAN) IV, mouth rinse, pantoprazole, polyethylene glycol, traMADol, traZODone   Vital Signs    Vitals:   04/15/22 1919 04/15/22 2132 04/16/22 0350 04/16/22 0809  BP:  109/72 105/84 125/81  Pulse:  73 60 63  Resp:  20 20   Temp:  97.8 F (36.6 C) 98.1 F (36.7 C) 98.4 F (36.9 C)  TempSrc:  Oral Oral Oral  SpO2: 94% 92% 97% 90%  Weight:      Height:        Intake/Output Summary (Last 24 hours) at 04/16/2022 0902 Last data filed at 04/16/2022 0500 Gross per 24 hour  Intake 480 ml  Output 1350 ml  Net -870 ml   Filed Weights   04/06/22 1302 04/07/22 1104 04/08/22 0500  Weight: 72.6 kg 73.7 kg 73.7 kg    Telemetry     Personally reviewed, atrial  flutter that converted spontaneously to normal sinus rhythm  ECG    Not performed today  Physical Exam   GEN: No acute distress.   Neck: No JVD. Cardiac: RRR, no murmur, rub, or gallop.  Respiratory: Nonlabored. Clear to auscultation bilaterally. GI: Soft, nontender, bowel sounds present. MS: No edema; No deformity. Neuro:  Nonfocal. Psych: Alert and oriented x 3. Normal affect.  Labs    Chemistry Recent Labs  Lab 04/14/22 0512 04/15/22 0405 04/16/22 0435  NA 139 140 136  K 4.6 4.9 5.3*  CL 99 100 98  CO2 34* 33* 30  GLUCOSE 128* 98 128*  BUN 42* 37* 38*  CREATININE 0.94 0.81 0.93  CALCIUM 10.1 10.1 10.1  GFRNONAA >60 >60 >60  ANIONGAP 6 7 8      Hematology Recent Labs  Lab 04/14/22 0512 04/15/22 0405 04/16/22 0435  WBC 16.7* 16.5* 23.0*  RBC 5.55* 5.37* 5.49*  HGB 15.6* 15.3* 15.6*  HCT 50.7* 48.7* 49.3*  MCV 91.4 90.7 89.8  MCH 28.1 28.5 28.4  MCHC 30.8 31.4 31.6  RDW 14.8 15.2 15.1  PLT 470* 460* 464*    Cardiac Enzymes Recent Labs  Lab 04/06/22 1410 04/06/22 1552 04/08/22 0502  TROPONINIHS 50*  50* 17    BNP Recent Labs  Lab 04/14/22 0512 04/15/22 0405 04/16/22 0435  BNP 155.0* 115.0* 131.0*     DDimerNo results for input(s): "DDIMER" in the last 168 hours.   Radiology    DG CHEST PORT 1 VIEW  Result Date: 04/16/2022 CLINICAL DATA:  Shortness of breath EXAM: PORTABLE CHEST 1 VIEW COMPARISON:  CT Chest 04/10/22, CXR 04/13/22 FINDINGS: There is a persistent layering left-sided pleural effusion. No pneumothorax. Compared to prior exam there is a rounded lucency at the left lung base, which is nonspecific. If there is a history of recent thoracentesis, this could represent a small amount of pleural air. Alternatively if there is no history of recent thoracentesis an abscess is not entirely excluded. Cardiac and mediastinal contours are unchanged from prior exam. No new focal airspace opacity. No radiographically apparent displaced rib  fractures. Visualized upper abdomen is unremarkable. No radiographically apparent new displaced rib fractures. IMPRESSION: 1. Persistent layering left-sided pleural effusion. 2. Compared to prior exam there is a rounded lucency at the left lung base, which is nonspecific. If there is a history of recent thoracentesis, this could represent a small amount of pleural air. Alternatively if there is no history of recent thoracentesis an abscess is not entirely excluded. Recommend further evaluation with a dedicated chest CT. Electronically Signed   By: Marin Roberts M.D.   On: 04/16/2022 07:47    Cardiac Studies  Echocardiogram on 04/07/2022 LVEF 35 to 40% with RWMA RV systolic function is normal Trivial MR No aortic valve stenosis or aortic regurgitation  NM stress test in 2019 Low restudy, apical thinning present No ischemia  Assessment & Plan   Patient is a 77 year old F known to have new onset cardiomyopathy LVEF 35 to 40% with RWMA, new onset atrial fibrillation and atrial flutter this admission, LM and coronary artery calcifications on CT scan, severe COPD, HLD is currently admitted to hospitalist team for the management of COPD exacerbation. Cardiology is consulted for new onset of atrial fibrillation with RVR and new onset cardiomyopathy, LVEF 35 to 40% with RWMA.  # New onset ischemic cardiomyopathy LVEF 35 to 40% with RWMA -Start Lasix 20 mg once daily PRN -Continue bisoprolol 5 mg once daily -Continue Entresto from 24-26 mg twice daily -Continue spironolactone 25 mg once daily -Continue Farxiga 10 mg once daily -Prior CT imaging showed LM and severe coronary artery calcifications.  She will need LHC for further evaluation however she will also need repeat PFTs to determine the severity of COPD. If severe COPD, I think she will benefit from medical management and not LHC. Her respiratory status will need to be optimized as well.  She will be seen in outpatient cardiology clinic in 1 month  upon discharge after PFTs.  # New onset paroxysmal atrial fibrillation and paroxysmal atrial flutter -Continue amiodarone 200 mg twice daily -Continue Eliquis 5 mg twice daily -Outpatient OSA evaluation -Obtain EKG today  # HLD: Continue rosuvastatin 10 mg nightly. Goal LDL less than 70.  [facility] will sign off.   Medication Recommendations: Lasix 20 mg once daily PRN, bisoprolol 5 mg once daily, Entresto 24-26 mg twice daily, spironolactone 25 mg once daily, Farxiga 10 mg once daily, amiodarone 200 mg twice daily (for 3 weeks followed by 200 mg once daily), Eliquis 5 mg twice daily, rosuvastatin 10 mg nightly. Other recommendations (labs, testing, etc): PFTs Follow up as an outpatient: Cardiology clinic follow-up in 1 month  I have spent a total of 30  minutes with patient reviewing chart , telemetry, EKGs, labs and examining patient as well as establishing an assessment and plan that was discussed with the patient.  > 50% of time was spent in direct patient care.    Signed, Chalmers Guest, MD  04/16/2022, 9:02 AM

## 2022-04-16 NOTE — Progress Notes (Signed)
Attempted to wean pt to room air and her oxygen dropped to 88-89% at rest. Placed pt back on 2L [place] oxygen sats 97%.

## 2022-04-16 NOTE — Progress Notes (Signed)
Mobility Specialist Progress Note:    04/16/22 1015  Mobility  Activity Ambulated with assistance in room;Transferred to/from [facility];Transferred from bed to chair  Level of Assistance Minimal assist, patient does 75% or more  Assistive Device Front wheel walker  Distance Ambulated (ft) 20 ft  Activity Response Tolerated well  Mobility Referral Yes  $Mobility charge 1 Mobility   Pt agreeable to mobility session. Received in bed, ambulated B> BSC, pt had one BM. NT's able to assist with peri care. Tolerated session well, pt had substantial tremors, SOB, and coughing spells throughout. MinA required for STS, CGA necessary for ambulation and transfer for safety. Pt was urinary incontinent once transferred to chair. Left pt in chair, in care of NT's, all needs met, call bell in reach.  Royetta Crochet Mobility Specialist Please contact via Solicitor or  Rehab office at [phone]

## 2022-04-16 NOTE — TOC Progression Note (Signed)
Transition of Care [facility]) - Progression Note    Patient Details  Name: [name] MRN: [id number] Date of Birth: 01-03-1946  [facility] CM/SW Contact  Salome Arnt, [place] Phone Number: 04/16/2022, 10:23 AM  Clinical Narrative: LCSW confirmed family does not want home health. Granddaughter, [name] is agreeable to outpatient palliative referral and requests [facility]. Referral sent and confirmed received by [facility]. Pt will need home O2, rolling walker, and BSC. Orders in. RN aware sat qualification note needed. No preference on DME agency. Referred to [facility] with [employer]. Anticipate d/c tomorrow per MD.       Expected Discharge Plan: Home/Self Care Barriers to Discharge: Continued Medical Work up  Expected Discharge Plan and Services In-house Referral: Clinical Social Work Discharge Planning Services: CM Consult   Living arrangements for the past 2 months: Single Family Home                                       Social Determinants of Health (SDOH) Interventions SDOH Screenings   Food Insecurity: No Food Insecurity (04/07/2022)  Housing: Low Risk  (04/07/2022)  Transportation Needs: No Transportation Needs (04/07/2022)  Utilities: Not At Risk (04/07/2022)  Alcohol Screen: Low Risk  (02/19/2022)  Depression (PHQ2-9): Low Risk  (02/19/2022)  Financial Resource Strain: Low Risk  (02/19/2022)  Physical Activity: Insufficiently Active (02/19/2022)  Social Connections: Socially Isolated (02/19/2022)  Stress: No Stress Concern Present (02/19/2022)  Tobacco Use: High Risk (04/11/2022)    Readmission Risk Interventions    04/13/2022   11:11 AM  Readmission Risk Prevention Plan  Transportation Screening Complete  Home Care Screening Complete  Medication Review (RN CM) Complete

## 2022-04-16 NOTE — Progress Notes (Signed)
PROGRESS NOTE    Patient: [name]                            PCP: Tanya Helper, MD                    DOB: 11/03/1945            DOA: 04/06/2022 [id number]             DOS: 04/16/2022, 12:36 PM   LOS: 9 days   Date of Service: The patient was seen and examined on 04/16/2022  Subjective:   The patient was seen and examined this morning, awake alert oriented sitting up in bed having breakfast -On exam patient was on room air satting 94% She has not been ambulated yet to reassess her O2 demand  Noted for leukocytosis, but patient is alert oriented afebrile normotensive Will make sure of no superimposed infection   Daughter present at bedside-updated  Brief Narrative:   Brief Narrative:  [name] is a 77 y.o. female with past medical history relevant for COPD/asthma in the setting of ongoing tobacco use, as well as history of left breast cancer, diabetes type 2 and hypertension admitted on 04/07/2022 with acute hypoxic and hypercapnic respiratory failure in the setting of community-acquired pneumonia/fibrillation ablation as well as new onset atrial fibrillation and low EF.  Assessment and Plan:     1)Acute COPD exacerbation/tobacco abuse -acute respiratory failure -- Staff reported satting 86% on room air, > 91% on 3 L of oxygen  Obtaining CT angiogram: Negative for pulmonary embolism, CAD, small to moderate left pleural effusion, patchy infiltrate in both lower lobes ?  Interstitial pneumonia  --Likely secondary to respiratory infection - -continue nebs, tapering down steroids, de-escalating antibiotics -Pulmonary were following closely   2)Presumed CAP-- -much improved - POA: productive cough with colored sputum, low-grade fevers and leukocytosis --Chest x-ray suggestive of possible atypical/viral pneumonia,  -Procalcitonin 0.26 -COVID, RSV and influenza negative -Respiratory culture NGTD -Acute on chronic WBC is 19.6 >>> 12.0 >>> 10.9 >>17.8 >> 16.7  (>> 16.5 on steroids) -Aggressively with IV antibiotics and switch to p.o. azithromycin completed course 04/15/2022   3)New onset A-fib with RVR- -heart rate stabilized -Continue regimen amiodarone 200 mg p.o. twice daily, Eliquis, (Status post treatment IV heparin, IV amiodarone)  -Troponin 50 >> 50 >> 17 -TSH WNL  -Cardiology were following closely Recommended to continue Bisoprolo and PO amiodarone, Eliquis, Entresto 24/26 mg twice a day-continue Aldactone, discontinued IV Lasix     4) acute hypoxic and hypercapnic respiratory failure--- due to #1, #2 #3 above -O2 sat> 91% on 3 L of oxygen -Likely will need home O2  --POA - in the ED patient is found to be hypoxic with O2 sats of 84% on room air and 90% on 2 L with productive   -Hypoxia persisted -POA developed encephalopathy due to hypoxia -resolved  POA: uncompensated respiratory acidosis due to hypercapnia with a pH of 7.18 and pCO2 of 82 - was on BiPAP 04/08/2022 -tapered off now on oxygen via nasal cannula -Repeat ABG as needed   - CTA of the chest today reviewed negative for pulmonary embolism     5)HFrEF-----acute systolic dysfunction CHF- Filed Weights   04/06/22 1302 04/07/22 1104 04/08/22 0500  Weight: 72.6 kg 73.7 kg 73.7 kg    Intake/Output Summary (Last 24 hours) at 04/15/2022 1035 Last data filed at 04/15/2022 0900 Gross  per 24 hour  Intake 480 ml  Output 1050 ml  Net -570 ml  continue Bisoprolo and IV amiodarone added Entresto 24/26 mg twice daily today 04/10/2022 To continue Aldactone a -D/Ced  IV Lasix   --Troponin 50 >> 50 >> 17 -- REDs: 23 now   Echo with EF of 35 to 40% with regional wall motion abnormalities--The inferior wall, mid anteroseptal segment, mid inferoseptal segment, and basal inferoseptal segment are hypokinetic  --Tachycardia induced cardiomyopathy with low EF Versus ischemic cardiomyopathy -Per cardiology recommendation continue:  Bisoprolol, spironolactone 50 mg daily, aspirin,  Entresto, Aldactone, Crestor, -Lasix-discontinued 04/13/2022  03/14/22 - start farxiga 10mg  daily.   Monitoring BUN/creatinine closely  -Appreciate follow-up with cardiology with recommendations    6)HTN-- Stable  continue Bisoprolo +  Amiodarone +  Entresto 24/26 mg twice daily + Aldactone    7)DM2-recent A1c 6.3% reflecting excellent diabetic control PTA -Hyperglycemic on steroids Use Novolog/Humalog Sliding scale insulin with Accu-Cheks/Fingersticks as ordered    8)Social/Ethics--- continue discussion with patient's granddaughter and daughter  [name] and granddaughter [name] who is patient's primary caregiver-full scope of care,   -Patient is a full code without limitations to treatment at this point   At this point patient's prognosis is poor due to acute respiratory failure due to pneumonia, and A-fib with RVR, labile vitals, significantly reduced heart failure new onset.   Consulting palliative care team -following   ------------------------------------------------------------------------------------------------------------------------- Nutritional status:  The patient's BMI is: Body mass index is 27.04 kg/m. I agree with the assessment and plan as outlined below: Nutrition Status:      ------------------------------------------------------------------------------------------------------------------------ Cultures; Blood Cultures x 2 >> NGT Sputum Culture >> NGT  -------------------------------------------------------------------------------------------------------------------------  Consults: PCCM/cardiology/palliative care  DVT prophylaxis:  SCDs Start: 04/06/22 1655 Place TED hose Start: 04/06/22 1655 apixaban (ELIQUIS) tablet 5 mg   Code Status:   Code Status: Full Code  Family Communication: No family member present at bedside- attempt will be made to update daily POA daughter [name] at [phone] The above findings and plan  of care has been discussed with patient and family  in detail,  they expressed understanding and agreement of above. -Advance care planning has been discussed.   Admission status:   Status is: Inpatient Remains inpatient appropriate because: Needing close monitoring and trending in intensive care unit for acute respiratory failure, A-fib with RVR   Disposition: From  - home             Planning for discharge in 1 days: to home with home health  Procedures:   No admission procedures for hospital encounter.   Antimicrobials:  Anti-infectives (From admission, onward)    Start     Dose/Rate Route Frequency Ordered Stop   04/16/22 1000  azithromycin (ZITHROMAX) tablet 500 mg        500 mg Oral Daily 04/16/22 0714     04/12/22 1115  azithromycin (ZITHROMAX) tablet 500 mg  Status:  Discontinued        500 mg Oral Daily 04/12/22 1019 04/15/22 1032   04/07/22 1400  cefTRIAXone (ROCEPHIN) 2 g in sodium chloride 0.9 % 100 mL IVPB        2 g 200 mL/hr over 30 Minutes Intravenous Every 24 hours 04/06/22 1421 04/12/22 1442   04/07/22 1400  azithromycin (ZITHROMAX) 500 mg in sodium chloride 0.9 % 250 mL IVPB  Status:  Discontinued        500 mg 250 mL/hr over 60 Minutes Intravenous Every 24 hours 04/06/22  1421 04/06/22 1516   04/07/22 1400  doxycycline (VIBRAMYCIN) 100 mg in sodium chloride 0.9 % 250 mL IVPB  Status:  Discontinued        100 mg 125 mL/hr over 120 Minutes Intravenous Every 12 hours 04/06/22 1514 04/12/22 1019   04/06/22 1330  levofloxacin (LEVAQUIN) IVPB 750 mg        750 mg 100 mL/hr over 90 Minutes Intravenous  Once 04/06/22 1328 04/06/22 1438        Medication:   acidophilus  2 capsule Oral TID   amiodarone  200 mg Oral BID   apixaban  5 mg Oral BID   azithromycin  500 mg Oral Daily   bisoprolol  5 mg Oral Daily   calcium-vitamin D  1 tablet Oral BID   Chlorhexidine Gluconate Cloth  6 each Topical Daily   cyanocobalamin  500 mcg Oral Daily   cyclobenzaprine  5  mg Oral QHS   dapagliflozin propanediol  10 mg Oral Daily   guaiFENesin  1,200 mg Oral BID   insulin aspart  0-6 Units Subcutaneous TID WC   ipratropium-albuterol  3 mL Nebulization BID   mometasone-formoterol  2 puff Inhalation BID   polyethylene glycol  17 g Oral Daily   rosuvastatin  10 mg Oral Daily   sacubitril-valsartan  1 tablet Oral BID   sodium chloride flush  3 mL Intravenous Q12H   [START ON 04/17/2022] spironolactone  25 mg Oral Daily    acetaminophen **OR** acetaminophen, albuterol, bisacodyl, levalbuterol, ondansetron **OR** ondansetron (ZOFRAN) IV, mouth rinse, pantoprazole, polyethylene glycol, traMADol, traZODone   Objective:   Vitals:   04/16/22 0350 04/16/22 0809 04/16/22 0941 04/16/22 0942  BP: 105/84 125/81    Pulse: 60 63 61   Resp: 20  18   Temp: 98.1 F (36.7 C) 98.4 F (36.9 C)    TempSrc: Oral Oral    SpO2: 97% 90% 94% 94%  Weight:      Height:        Intake/Output Summary (Last 24 hours) at 04/16/2022 1236 Last data filed at 04/16/2022 1025 Gross per 24 hour  Intake 480 ml  Output 1000 ml  Net -520 ml   Filed Weights   04/06/22 1302 04/07/22 1104 04/08/22 0500  Weight: 72.6 kg 73.7 kg 73.7 kg     Physical examination:    General:  AAO x 3,  cooperative, no distress; which improved shortness of breath  HEENT:  Normocephalic, PERRL, otherwise with in Normal limits   Neuro:  CNII-XII intact. , normal motor and sensation, reflexes intact   Lungs:   Clear to auscultation BL, Respirations unlabored,  No wheezes / crackles  Cardio:    S1/S2, RRR, No murmure, No Rubs or Gallops   Abdomen:  Soft, non-tender, bowel sounds active all four quadrants, no guarding or peritoneal signs.  Muscular  skeletal:  Limited exam -severe generalized/global generalized weaknesses - in bed, able to move all 4 extremities,   2+ pulses,  symmetric, No pitting edema  Skin:  Dry, warm to touch, negative for any Rashes,  Wounds: Please see nursing documentation          ------------------------------------------------------------------------------------------------------------------------------    LABs:     Latest Ref Rng & Units 04/16/2022    4:35 AM 04/15/2022    4:05 AM 04/14/2022    5:12 AM  CBC  WBC 4.0 - 10.5 K/uL 23.0  16.5  16.7   Hemoglobin 12.0 - 15.0 g/dL 15.6  15.3  15.6  Hematocrit 36.0 - 46.0 % 49.3  48.7  50.7   Platelets 150 - 400 K/uL 464  460  470       Latest Ref Rng & Units 04/16/2022    4:35 AM 04/15/2022    4:05 AM 04/14/2022    5:12 AM  CMP  Glucose 70 - 99 mg/dL 128  98  128   BUN 8 - 23 mg/dL 38  37  42   Creatinine 0.44 - 1.00 mg/dL 0.93  0.81  0.94   Sodium 135 - 145 mmol/L 136  140  139   Potassium 3.5 - 5.1 mmol/L 5.3  4.9  4.6   Chloride 98 - 111 mmol/L 98  100  99   CO2 22 - 32 mmol/L 30  33  34   Calcium 8.9 - 10.3 mg/dL 10.1  10.1  10.1      Radiology Reports DG CHEST PORT 1 VIEW  Result Date: 04/16/2022 CLINICAL DATA:  Shortness of breath EXAM: PORTABLE CHEST 1 VIEW COMPARISON:  CT Chest 04/10/22, CXR 04/13/22 FINDINGS: There is a persistent layering left-sided pleural effusion. No pneumothorax. Compared to prior exam there is a rounded lucency at the left lung base, which is nonspecific. If there is a history of recent thoracentesis, this could represent a small amount of pleural air. Alternatively if there is no history of recent thoracentesis an abscess is not entirely excluded. Cardiac and mediastinal contours are unchanged from prior exam. No new focal airspace opacity. No radiographically apparent displaced rib fractures. Visualized upper abdomen is unremarkable. No radiographically apparent new displaced rib fractures. IMPRESSION: 1. Persistent layering left-sided pleural effusion. 2. Compared to prior exam there is a rounded lucency at the left lung base, which is nonspecific. If there is a history of recent thoracentesis, this could represent a small amount of pleural air. Alternatively if there is no  history of recent thoracentesis an abscess is not entirely excluded. Recommend further evaluation with a dedicated chest CT. Electronically Signed   By: Marin Roberts M.D.   On: 04/16/2022 07:47    SIGNED: Deatra James, MD, FHM. FAAFP. [facility] - [facility] Time spent , 55 min.  time was spent in seeing, evaluating and examining the patient. Reviewing medical records, labs, drawn plan of care. [facility],  Pager (please use [email or web address] to page/ text) Please use Epic Secure Chat for non-urgent communication (7AM-7PM)  If 7PM-7AM, please contact night-coverage [email or web address], 04/16/2022, 12:36 PM

## 2022-04-17 ENCOUNTER — Other Ambulatory Visit: Payer: Self-pay | Admitting: Internal Medicine

## 2022-04-17 ENCOUNTER — Other Ambulatory Visit: Payer: Self-pay | Admitting: Family Medicine

## 2022-04-17 DIAGNOSIS — J9601 Acute respiratory failure with hypoxia: Secondary | ICD-10-CM | POA: Diagnosis not present

## 2022-04-17 LAB — CBC
HCT: 48.4 % — ABNORMAL HIGH (ref 36.0–46.0)
Hemoglobin: 15.3 g/dL — ABNORMAL HIGH (ref 12.0–15.0)
MCH: 28.4 pg (ref 26.0–34.0)
MCHC: 31.6 g/dL (ref 30.0–36.0)
MCV: 89.8 fL (ref 80.0–100.0)
Platelets: 428 10*3/uL — ABNORMAL HIGH (ref 150–400)
RBC: 5.39 MIL/uL — ABNORMAL HIGH (ref 3.87–5.11)
RDW: 15 % (ref 11.5–15.5)
WBC: 18.6 10*3/uL — ABNORMAL HIGH (ref 4.0–10.5)
nRBC: 0 % (ref 0.0–0.2)

## 2022-04-17 LAB — BASIC METABOLIC PANEL
Anion gap: 7 (ref 5–15)
Anion gap: 8 (ref 5–15)
BUN: 40 mg/dL — ABNORMAL HIGH (ref 8–23)
BUN: 45 mg/dL — ABNORMAL HIGH (ref 8–23)
CO2: 28 mmol/L (ref 22–32)
CO2: 26 mmol/L (ref 22–32)
Calcium: 10.6 mg/dL — ABNORMAL HIGH (ref 8.9–10.3)
Calcium: 10.7 mg/dL — ABNORMAL HIGH (ref 8.9–10.3)
Chloride: 99 mmol/L (ref 98–111)
Chloride: 100 mmol/L (ref 98–111)
Creatinine, Ser: 1.01 mg/dL — ABNORMAL HIGH (ref 0.44–1.00)
Creatinine, Ser: 1.1 mg/dL — ABNORMAL HIGH (ref 0.44–1.00)
GFR, Estimated: 57 mL/min — ABNORMAL LOW (ref >60)
GFR, Estimated: 52 mL/min — ABNORMAL LOW (ref >60)
Glucose, Bld: 86 mg/dL (ref 70–99)
Glucose, Bld: 210 mg/dL — ABNORMAL HIGH (ref 70–99)
Potassium: 5.5 mmol/L — ABNORMAL HIGH (ref 3.5–5.1)
Potassium: 5.3 mmol/L — ABNORMAL HIGH (ref 3.5–5.1)
Sodium: 134 mmol/L — ABNORMAL LOW (ref 135–145)
Sodium: 134 mmol/L — ABNORMAL LOW (ref 135–145)

## 2022-04-17 LAB — GLUCOSE, CAPILLARY
Glucose-Capillary: 99 mg/dL (ref 70–99)
Glucose-Capillary: 229 mg/dL — ABNORMAL HIGH (ref 70–99)

## 2022-04-17 MED ORDER — SODIUM ZIRCONIUM CYCLOSILICATE 5 G PO PACK
5.0000 g | PACK | Freq: Once | ORAL | Status: AC
  Administered 2022-04-17: 5 g via ORAL
  Filled 2022-04-17: qty 1

## 2022-04-17 MED ORDER — AMIODARONE HCL 200 MG PO TABS: ORAL_TABLET | ORAL | 0 refills | Status: DC

## 2022-04-17 MED ORDER — PREDNISONE 20 MG PO TABS: ORAL_TABLET | ORAL | 0 refills | Status: AC

## 2022-04-17 MED ORDER — SPIRONOLACTONE 25 MG PO TABS: 25.0000 mg | ORAL_TABLET | Freq: Every day | ORAL | Status: DC

## 2022-04-17 MED ORDER — SPIRONOLACTONE 25 MG PO TABS: 25.0000 mg | ORAL_TABLET | Freq: Every day | ORAL | 0 refills | Status: DC

## 2022-04-17 NOTE — Progress Notes (Signed)
Patient discharged with instructions given on medications and follow up visits,patient verbalized understanding. Prescriptions sent to Pharmacy of choice documented on AV'S..IV  discontinued, catheter intact.Accompanied by staff to an awaiting vehicle. 

## 2022-04-17 NOTE — Progress Notes (Signed)
Mobility Specialist Progress Note:    04/17/22 0945  Mobility  Activity Ambulated with assistance in room;Ambulated with assistance to bathroom  Level of Assistance Minimal assist, patient does 75% or more  Assistive Device Front wheel walker  Distance Ambulated (ft) 28 ft  Activity Response Tolerated well  Mobility Referral Yes  $Mobility charge 1 Mobility   Pt agreeable to mobility session. Ambulated to/from bathroom, requiring MinA to stand. Pt performed walking O2 test in room. SpO2 83% on RA during ambulation. Pt had substantial tremors, SOB, and fatigued quickly during session. Returned pt to chair, recovered, SpO2 98% on 1.5L. Left pt in care of nurse, all needs met, call bell in reach.  Royetta Crochet Mobility Specialist Please contact via Solicitor or  Rehab office at [phone]

## 2022-04-17 NOTE — TOC Transition Note (Signed)
Transition of Care [facility]) - CM/SW Discharge Note   Patient Details  Name: [name] MRN: [id number] Date of Birth: 11/14/1945  Transition of Care [facility] CM/SW Contact:  Leo Rod, LCSW Phone Number: 04/17/2022, 10:39 AM   Clinical Narrative:    CSW confirmed pt has 02 needs.  Dr. Chesley Noon orders, [employer] advised.  Pt D/C today. [facility] in place for palliative care.      Barriers to Discharge: Continued Medical Work up   Patient Goals and CMS Choice CMS Medicare.gov Compare Post Acute Care list provided to:: Patient Represenative (must comment) Choice offered to / list presented to : Adult Children  Discharge Placement                         Discharge Plan and Services Additional resources added to the After Visit Summary for   In-house Referral: Clinical Social Work Discharge Planning Services: CM Consult                                 Social Determinants of Health (SDOH) Interventions SDOH Screenings   Food Insecurity: No Food Insecurity (04/07/2022)  Housing: Low Risk  (04/07/2022)  Transportation Needs: No Transportation Needs (04/07/2022)  Utilities: Not At Risk (04/07/2022)  Alcohol Screen: Low Risk  (02/19/2022)  Depression (PHQ2-9): Low Risk  (02/19/2022)  Financial Resource Strain: Low Risk  (02/19/2022)  Physical Activity: Insufficiently Active (02/19/2022)  Social Connections: Socially Isolated (02/19/2022)  Stress: No Stress Concern Present (02/19/2022)  Tobacco Use: High Risk (04/11/2022)     Readmission Risk Interventions    04/13/2022   11:11 AM  Readmission Risk Prevention Plan  Transportation Screening Complete  Home Care Screening Complete  Medication Review (RN CM) Complete

## 2022-04-17 NOTE — Discharge Summary (Signed)
Physician Discharge Summary   Patient: [name] MRN: [id number] DOB: 05-18-1945  Admit date:     04/06/2022  Discharge date: 04/17/22  Discharge Physician: Deatra James   PCP: [name], MD   Recommendations at discharge:  -Need continuous oxygen via nasal cannula, to maintain O2 sat 88- 92% -Will need oxygen especially with exertion -Follow with the PCP in 1-2 weeks-follow -Follow-up with outpatient palliative care -Aggressive PT OT recommended, fall precautions -Follow-up with cardiology, heart failure team as an outpatient   Discharge Diagnoses: Principal Problem:   Acute respiratory failure with hypoxia Active Problems:   New onset a-fib   Acute HFrEF/systolic dysfunction CHF with EF 35 to 40% and regional wall motion normalities 6   HTN (hypertension)   COPD (chronic obstructive pulmonary disease) with chronic bronchitis (HCC)   Asthmatic bronchitis , chronic   Cigarette smoker   Hx of breast cancer   COPD with acute exacerbation  Resolved Problems:   * No resolved hospital problems. *  Hospital Course: Brief Narrative:  [name] is a 77 y.o. female with past medical history relevant for COPD/asthma in the setting of ongoing tobacco use, as well as history of left breast cancer, diabetes type 2 and hypertension admitted on 04/07/2022 with acute hypoxic and hypercapnic respiratory failure in the setting of community-acquired pneumonia/fibrillation ablation as well as new onset atrial fibrillation and low EF.  Assessment and Plan:     1)Acute COPD exacerbation/tobacco abuse  -was in acute respiratory failure -Patient has been steadily weaned off O2 currently satting 94% on room air, will assess O2 demand on ambulation and exertion  Obtaining CT angiogram: Negative for pulmonary embolism, CAD, small to moderate left pleural effusion, patchy infiltrate in both lower lobes ?  Interstitial pneumonia  --Likely secondary to respiratory infection  - -continue nebs, tapering down steroids, de-escalating antibiotics -Pulmonary was following in ICU.   2)Presumed CAP-- -much improved - POA: productive cough with colored sputum, low-grade fevers and leukocytosis --Chest x-ray suggestive of possible atypical/viral pneumonia,  -Procalcitonin 0.26,   <0.1 now  -COVID, RSV and influenza negative -Respiratory culture NGTD -Acute on chronic WBC is 19.6 >>> 12.0 >>> 10.9 >>17.8 >>16.5 >> 18.6 (on steroids) -Status post treated with antibiotics Rocephin/azithromycin completed course 04/15/2022   3)New onset A-fib with RVR- -heart rate stabilized -Continue regimen amiodarone 200 mg p.o. twice daily, Eliquis, (Status post treatment IV heparin, IV amiodarone)  -Troponin 50 >> 50 >> 17 -TSH WNL  -Cardiology were following closely Recommended to continue Bisoprolo and PO amiodarone, Eliquis, Entresto 24/26 mg twice a day-continue Aldactone, discontinued IV Lasix     4) acute hypoxic and hypercapnic respiratory failure--- due to #1, #2 #3 above -Much improved currently resting on room air satting 94% -Will ambulate patient on exertion for possible need of home O2 -Likely will need home O2  --POA - in the ED patient is found to be hypoxic with O2 sats of 84% on room air and 90% on 2 L with productive   -Hypoxia persisted -POA developed encephalopathy due to hypoxia -resolved  POA: uncompensated respiratory acidosis due to hypercapnia with a pH of 7.18 and pCO2 of 82 - was on BiPAP 04/08/2022 -tapered off now on oxygen via nasal cannula -Repeat ABG as needed   - CTA of the chest today reviewed negative for pulmonary embolism     5)HFrEF-----acute systolic dysfunction CHF- Filed Weights   04/06/22 1302 04/07/22 1104 04/08/22 0500  Weight: 72.6 kg 73.7 kg  73.7 kg    Intake/Output Summary (Last 24 hours) at 04/15/2022 1035 Last data filed at 04/15/2022 0900 Gross per 24 hour  Intake 480 ml  Output 1050 ml  Net -570 ml  continue  Bisoprolo and IV amiodarone added Entresto 24/26 mg twice daily today 04/10/2022 To continue Aldactone a -D/Ced  IV Lasix   --Troponin 50 >> 50 >> 17 -- REDs: 23 now   Echo with EF of 35 to 40% with regional wall motion abnormalities--The inferior wall, mid anteroseptal segment, mid inferoseptal segment, and basal inferoseptal segment are hypokinetic  --Tachycardia induced cardiomyopathy with low EF Versus ischemic cardiomyopathy -Per cardiology recommendation continue:  Bisoprolol, spironolactone 50 mg daily, aspirin, Entresto, Aldactone, Crestor, -Lasix-discontinued 04/13/2022  03/14/22 - start farxiga 10mg  daily.   Monitoring BUN/creatinine closely  -Appreciate follow-up with cardiology with recommendations    6)HTN-- Stable  continue Bisoprolo +  Amiodarone +  Entresto 24/26 mg twice daily + Aldactone    7)DM2-recent A1c 6.3% reflecting excellent diabetic control PTA -Hyperglycemic on steroids Use Novolog/Humalog Sliding scale insulin with Accu-Cheks/Fingersticks as ordered    8)Social/Ethics--- continue discussion with patient's granddaughter and daughter  [name] and granddaughter [name] who is patient's primary caregiver-full scope of care,   -Patient is a full code without limitations to treatment at this point   At this point patient's prognosis is poor due to acute respiratory failure due to pneumonia, and A-fib with RVR, labile vitals, significantly reduced heart failure new onset.   Consulting palliative care team -following--patient remains full code   Patient will remain in the hospital due to acute leukocytosis and assessment for superimposed infection, assessment for O2 demand  Planning to discharge home-family has declined home health DME will be provided      Consultants: Cardiology/pulmonary Procedures performed: None Disposition: Home Diet recommendation:  Discharge Diet Orders (From admission, onward)     Start     Ordered    04/15/22 0000  Diet - low sodium heart healthy        04/15/22 1053           Cardiac diet DISCHARGE MEDICATION: Allergies as of 04/17/2022       Reactions   Penicillins Hives, Shortness Of Breath, Swelling   Has patient had a PCN reaction causing immediate rash, facial/tongue/throat swelling, SOB or lightheadedness with hypotension: yes Has patient had a PCN reaction causing severe rash involving mucus membranes or skin necrosis:no Has patient had a PCN reaction that required hospitalization: yes Has patient had a PCN reaction occurring within the last 10 years: no If all of the above answers are "NO", then may proceed with Cephalosporin use. Tolerates rocephin/keflex   Sulfonamide Derivatives Other (See Comments)   Shaking all over, seizure like symptoms. Hospitalization resulted    Avapro [irbesartan] Other (See Comments)   Pt associates avapro with bruising though aware scientifically not the case, wants to chanmge med   Levaquin [levofloxacin] Itching        Medication List     STOP taking these medications    amLODipine 5 MG tablet Commonly known as: NORVASC   aspirin EC 81 MG tablet   diclofenac Sodium 1 % Gel Commonly known as: Voltaren   erythromycin base 500 MG tablet Commonly known as: E-MYCIN   montelukast 10 MG tablet Commonly known as: SINGULAIR   Potassium 99 MG Tabs       TAKE these medications    acetaminophen 325 MG tablet Commonly known as: TYLENOL Take 650  mg by mouth every 6 (six) hours as needed for moderate pain.   albuterol (2.5 MG/3ML) 0.083% nebulizer solution Commonly known as: PROVENTIL INHALE ONE VIAL VIA NEBULIZER THREE TIMES DAILY. What changed: See the new instructions.   albuterol 108 (90 Base) MCG/ACT inhaler Commonly known as: VENTOLIN HFA INHALE (2) PUFFS EVERY FOURHOURS AS NEEDED ONLY IF YOU CANT CATCH YOUR BREATH What changed:  how much to take how to take this when to take this reasons to take this    amiodarone 200 MG tablet Commonly known as: PACERONE Take 1 tablet (200 mg total) by mouth 2 (two) times daily for 5 days, THEN 1 tablet (200 mg total) daily. Start taking on: April 15, 2022   apixaban 5 MG Tabs tablet Commonly known as: ELIQUIS Take 1 tablet (5 mg total) by mouth 2 (two) times daily.   Atrovent HFA 17 MCG/ACT inhaler Generic drug: ipratropium INHALE 2 PUFFS BY MOUTH FOUR TIMES A DAY. What changed: See the new instructions.   benzonatate 100 MG capsule Commonly known as: TESSALON Take 1 capsule (100 mg total) by mouth 2 (two) times daily as needed for cough.   bisoprolol 5 MG tablet Commonly known as: ZEBETA Take 1 tablet (5 mg total) by mouth daily.   CALCIUM 600 + D PO Take 1 tablet by mouth 2 (two) times daily.   CLEAR EYES OP Place 1 drop into both eyes daily as needed (irritation).   cyclobenzaprine 5 MG tablet Commonly known as: FLEXERIL Take 1 tablet (5 mg total) by mouth 3 (three) times daily as needed for muscle spasms. What changed:  medication strength how much to take when to take this reasons to take this additional instructions   dapagliflozin propanediol 10 MG Tabs tablet Commonly known as: FARXIGA Take 1 tablet (10 mg total) by mouth daily.   fluticasone 50 MCG/ACT nasal spray Commonly known as: FLONASE PLACE 2 SPRAYS IN EACH NOSTRIL DAILY. What changed: See the new instructions.   guaiFENesin 600 MG 12 hr tablet Commonly known as: MUCINEX Take 2 tablets (1,200 mg total) by mouth 2 (two) times daily for 5 days.   Misc. Devices Misc Please provide patient with mastectomy bra and prosthesis. Dx: History of left breast cancer   nicotine 7 mg/24hr patch Commonly known as: NICODERM CQ - dosed in mg/24 hr Place 1 patch (7 mg total) onto the skin daily.   pantoprazole 40 MG tablet Commonly known as: PROTONIX TAKE ONE TABLET BY MOUTH ONCE DAILY BEFORE BREAKFAST. What changed: See the new instructions.   polyethylene glycol  powder 17 GM/SCOOP powder Commonly known as: GLYCOLAX/MIRALAX MIX 1 CAPFUL IN 8 OUNCES OF JUICE OR WATER AND DRINK ONCE DAILY. What changed: See the new instructions.   predniSONE 20 MG tablet Commonly known as: DELTASONE Take 1 tablet (20 mg total) by mouth daily with breakfast for 3 days, THEN 0.5 tablets (10 mg total) daily with breakfast for 3 days. Start taking on: April 15, 2022   rosuvastatin 10 MG tablet Commonly known as: CRESTOR TAKE 1 TABLET BY MOUTH ONCE A DAY.   sacubitril-valsartan 24-26 MG Commonly known as: ENTRESTO Take 1 tablet by mouth 2 (two) times daily.   spironolactone 25 MG tablet Commonly known as: ALDACTONE Take 1 tablet (25 mg total) by mouth daily. Start taking on: April 18, 2022 What changed:  medication strength how much to take   Symbicort 160-4.5 MCG/ACT inhaler Generic drug: budesonide-formoterol INHALE 2 PUFFS INTO THE LUNGS TWICE DAILY.   traMADol  50 MG tablet Commonly known as: ULTRAM One tablet every eight hours as needed for pain. What changed:  how much to take how to take this when to take this reasons to take this   Vitamin B 12 500 MCG Tabs Take 500 mcg by mouth daily. Take one tablet by mouth once daily               Durable Medical Equipment  (From admission, onward)           Start     Ordered   04/16/22 1008  For home use only DME Bedside commode  Once       Question:  Patient needs a bedside commode to treat with the following condition  Answer:  Weakness   04/16/22 1008   04/15/22 1051  DME Walker  Once       Question Answer Comment  Walker: With [place]   Patient needs a walker to treat with the following condition Weakness      04/15/22 1053   04/15/22 1051  DME Oxygen  Once       Question Answer Comment  Length of Need 6 Months   Mode or (Route) Nasal cannula   Liters per Minute 2   Frequency Continuous (stationary and portable oxygen unit needed)   Oxygen conserving device Yes   Oxygen  delivery system Gas      04/15/22 [facility] Follow up.   Why: Will follow up for outpatient palliative. Contact information: [address] [place] [address] [phone]                Discharge Exam: Danley Danker Weights   04/06/22 1302 04/07/22 1104 04/08/22 0500  Weight: 72.6 kg 73.7 kg 73.7 kg        General:  AAO x 3,  cooperative, no distress; shortness of breath with exertion  HEENT:  Normocephalic, PERRL, otherwise with in Normal limits   Neuro:  CNII-XII intact. , normal motor and sensation, reflexes intact   Lungs:   Clear to auscultation BL, Respirations unlabored,  No wheezes / crackles  Cardio:    Irregularly irregular - no murmure, No Rubs or Gallops   Abdomen:  Soft, non-tender, bowel sounds active all four quadrants, no guarding or peritoneal signs.  Muscular  skeletal:  Limited exam -severe/global generalized weaknesses - in bed, able to move all 4 extremities,   2+ pulses,  symmetric, No pitting edema  Skin:  Dry, warm to touch, negative for any Rashes,  Wounds: Please see nursing documentation          Condition at discharge: fair  The results of significant diagnostics from this hospitalization (including imaging, microbiology, ancillary and laboratory) are listed below for reference.   Imaging Studies: DG CHEST PORT 1 VIEW  Result Date: 04/16/2022 CLINICAL DATA:  Shortness of breath EXAM: PORTABLE CHEST 1 VIEW COMPARISON:  CT Chest 04/10/22, CXR 04/13/22 FINDINGS: There is a persistent layering left-sided pleural effusion. No pneumothorax. Compared to prior exam there is a rounded lucency at the left lung base, which is nonspecific. If there is a history of recent thoracentesis, this could represent a small amount of pleural air. Alternatively if there is no history of recent thoracentesis an abscess is not entirely excluded. Cardiac and mediastinal contours are unchanged from prior  exam. No new focal airspace opacity. No radiographically apparent displaced  rib fractures. Visualized upper abdomen is unremarkable. No radiographically apparent new displaced rib fractures. IMPRESSION: 1. Persistent layering left-sided pleural effusion. 2. Compared to prior exam there is a rounded lucency at the left lung base, which is nonspecific. If there is a history of recent thoracentesis, this could represent a small amount of pleural air. Alternatively if there is no history of recent thoracentesis an abscess is not entirely excluded. Recommend further evaluation with a dedicated chest CT. Electronically Signed   By: Marin Roberts M.D.   On: 04/16/2022 07:47   DG CHEST PORT 1 VIEW  Result Date: 04/13/2022 CLINICAL DATA:  Shortness of breath EXAM: PORTABLE CHEST 1 VIEW COMPARISON:  Chest x-ray dated April 08, 2022 FINDINGS: Cardiac and mediastinal contours are unchanged. Small left pleural effusion and bibasilar atelectasis. No evidence of pneumothorax. IMPRESSION: Small left pleural effusion and bibasilar atelectasis. Electronically Signed   By: Yetta Glassman M.D.   On: 04/13/2022 09:33   CT Angio Chest Pulmonary Embolism (PE) W or WO Contrast  Result Date: 04/10/2022 CLINICAL DATA:  Shortness of breath, clinical suspicion for pulmonary embolism EXAM: CT ANGIOGRAPHY CHEST WITH CONTRAST TECHNIQUE: Multidetector CT imaging of the chest was performed using the standard protocol during bolus administration of intravenous contrast. Multiplanar CT image reconstructions and MIPs were obtained to evaluate the vascular anatomy. RADIATION DOSE REDUCTION: This exam was performed according to the departmental dose-optimization program which includes automated exposure control, adjustment of the mA and/or kV according to patient size and/or use of iterative reconstruction technique. CONTRAST:  46mL OMNIPAQUE IOHEXOL 350 MG/ML SOLN COMPARISON:  Noncontrast CT chest done on 03/23/2022, chest radiograph done on  04/08/2022 FINDINGS: Cardiovascular: There are no intraluminal filling defects in pulmonary artery branches. Evaluation of small subsegmental branches in the lower lung fields is limited by infiltrates. Coronary artery calcifications are seen. There is homogeneous enhancement in the lumen of thoracic aorta. Scattered calcifications are seen in thoracic aorta and its major branches. Mediastinum/Nodes: Subcentimeter nodes are seen in mediastinum and hilar regions. Lungs/Pleura: Small to moderate bilateral pleural effusions are seen, more so on the left side. There are patchy infiltrates in both lower lobes. There are small scattered patchy infiltrates in both upper lobes. There is a pleural thickening in the anterolateral aspect of left upper lung field. There is no pneumothorax. Upper Abdomen: Surgical clips are seen in gallbladder fossa. Low-density in liver close to falciform ligament may suggest aberrant venous drainage. Musculoskeletal: No acute findings are seen. Review of the MIP images confirms the above findings. IMPRESSION: There is no evidence of pulmonary artery embolism. There is no evidence of thoracic aortic dissection. Coronary artery calcifications are seen. Small to moderate bilateral pleural effusions, more so on the left side. There are patchy infiltrates in both lower lobes suggesting atelectasis/pneumonia. There are small scattered patchy infiltrates in both upper lobes, possibly suggesting foci of interstitial pneumonia. There is pleural thickening in the anterolateral aspect of left upper lung field which may be residual from previous intervention or previous trauma. Electronically Signed   By: Elmer Picker M.D.   On: 04/10/2022 15:00   DG CHEST PORT 1 VIEW  Result Date: 04/08/2022 CLINICAL DATA:  Dyspnea EXAM: PORTABLE CHEST 1 VIEW COMPARISON:  04/06/2022 FINDINGS: The cardio pericardial silhouette is enlarged. Interstitial markings are diffusely coarsened with chronic features.  There is pulmonary vascular congestion without overt pulmonary edema. No focal airspace consolidation or pleural effusion. Bones are diffusely demineralized. Telemetry leads overlie the chest. IMPRESSION: Enlargement of the cardiopericardial silhouette  with pulmonary vascular congestion. Electronically Signed   By: Misty Stanley M.D.   On: 04/08/2022 12:38   ECHOCARDIOGRAM COMPLETE  Result Date: 04/07/2022    ECHOCARDIOGRAM REPORT   Patient Name:   LARKIN SIMONEAU Date of Exam: 04/07/2022 Medical Rec #:  [id number]      Height:       65.0 in Accession #:    [id number]     Weight:       162.5 lb Date of Birth:  04/03/1945     BSA:          1.811 m Patient Age:    5 years       BP:           120/66 mmHg Patient Gender: F              HR:           91 bpm. Exam Location:  [facility] Procedure: 2D Echo, Cardiac Doppler, Color Doppler and Intracardiac            Opacification Agent Indications:    R94.31 Abnormal EKG  History:        Patient has no prior history of Echocardiogram examinations.                 Abnormal ECG, COPD, Arrythmias:Atrial Fibrillation; Risk                 Factors:Hypertension, Current Smoker and Dyslipidemia. Breast                 cancer.  Sonographer:    Roseanna Rainbow RDCS Referring Phys: [id number] [facility]  Sonographer Comments: Technically difficult study due to poor echo windows. IMPRESSIONS  1. Left ventricular ejection fraction, by estimation, is 35 to 40%. The left ventricle has moderately decreased function. The left ventricle demonstrates regional wall motion abnormalities (see scoring diagram/findings for description). There is mild concentric left ventricular hypertrophy. Left ventricular diastolic parameters are indeterminate.  2. Right ventricular systolic function is normal. The right ventricular size is normal. Tricuspid regurgitation signal is inadequate for assessing PA pressure.  3. The mitral valve is grossly normal. Trivial mitral valve regurgitation.  4. The aortic valve  is tricuspid. There is mild calcification of the aortic valve. Aortic valve regurgitation is not visualized. Aortic valve sclerosis is present, with no evidence of aortic valve stenosis.  5. The inferior vena cava is normal in size with greater than 50% respiratory variability, suggesting right atrial pressure of 3 mmHg. Comparison(s): No prior Echocardiogram. FINDINGS  Left Ventricle: Left ventricular ejection fraction, by estimation, is 35 to 40%. The left ventricle has moderately decreased function. The left ventricle demonstrates regional wall motion abnormalities. Definity contrast agent was given IV to delineate the left ventricular endocardial borders. The left ventricular internal cavity size was normal in size. There is mild concentric left ventricular hypertrophy. Left ventricular diastolic function could not be evaluated due to atrial fibrillation. Left ventricular diastolic parameters are indeterminate.  LV Wall Scoring: The inferior wall, mid anteroseptal segment, mid inferoseptal segment, and basal inferoseptal segment are hypokinetic. The entire anterior wall, entire lateral wall, entire apex, and basal anteroseptal segment are normal. Right Ventricle: The right ventricular size is normal. No increase in right ventricular wall thickness. Right ventricular systolic function is normal. Tricuspid regurgitation signal is inadequate for assessing PA pressure. Left Atrium: Left atrial size was normal in size. Right Atrium: Right atrial size was normal in size. Pericardium: There is no evidence  of pericardial effusion. Mitral Valve: The mitral valve is grossly normal. Trivial mitral valve regurgitation. Tricuspid Valve: The tricuspid valve is grossly normal. Tricuspid valve regurgitation is trivial. Aortic Valve: The aortic valve is tricuspid. There is mild calcification of the aortic valve. There is mild aortic valve annular calcification. Aortic valve regurgitation is not visualized. Aortic valve sclerosis  is present, with no evidence of aortic valve stenosis. Pulmonic Valve: The pulmonic valve was grossly normal. Pulmonic valve regurgitation is trivial. Aorta: The aortic root is normal in size and structure. Venous: The inferior vena cava is normal in size with greater than 50% respiratory variability, suggesting right atrial pressure of 3 mmHg. IAS/Shunts: No atrial level shunt detected by color flow Doppler.  LEFT VENTRICLE PLAX 2D LVIDd:         4.50 cm LVIDs:         4.20 cm LV PW:         1.30 cm LV IVS:        1.20 cm LVOT diam:     2.40 cm LV SV:         86 LV SV Index:   48 LVOT Area:     4.52 cm  LV Volumes (MOD) LV vol d, MOD A2C: 90.1 ml LV vol d, MOD A4C: 89.1 ml LV vol s, MOD A2C: 46.5 ml LV vol s, MOD A4C: 50.2 ml LV SV MOD A2C:     43.6 ml LV SV MOD A4C:     89.1 ml LV SV MOD BP:      39.6 ml RIGHT VENTRICLE            IVC RV S prime:     9.03 cm/s  IVC diam: 2.10 cm TAPSE (M-mode): 1.5 cm LEFT ATRIUM             Index        RIGHT ATRIUM           Index LA diam:        3.70 cm 2.04 cm/m   RA Area:     12.50 cm LA Vol (A2C):   37.8 ml 20.87 ml/m  RA Volume:   28.50 ml  15.74 ml/m LA Vol (A4C):   44.9 ml 24.79 ml/m LA Biplane Vol: 41.7 ml 23.03 ml/m  AORTIC VALVE LVOT Vmax:   114.00 cm/s LVOT Vmean:  77.100 cm/s LVOT VTI:    0.191 m  AORTA Ao Root diam: 3.10 cm Ao Asc diam:  3.10 cm MITRAL VALVE MV Area (PHT): 7.16 cm     SHUNTS MV Decel Time: 106 msec     Systemic VTI:  0.19 m MV E velocity: 139.00 cm/s  Systemic Diam: 2.40 cm Rozann Lesches MD Electronically signed by Rozann Lesches MD Signature Date/Time: 04/07/2022/12:22:07 PM    Final    DG Chest Port 1 View  Result Date: 04/06/2022 CLINICAL DATA:  Provided history: Questionable sepsis-evaluate for abnormality. EXAM: PORTABLE CHEST 1 VIEW COMPARISON:  Chest CT 03/23/2022. Prior chest radiographs 11/09/2021 and earlier. FINDINGS: Heart size within normal limits. Aortic atherosclerosis. Prominence of the interstitial lung markings, new  from the prior chest radiographs of 11/09/2021. No appreciable airspace consolidation. No evidence of pleural effusion or pneumothorax. No acute osseous abnormality identified. Degenerative changes of the spine. IMPRESSION: Prominence of the interstitial lung markings, new from the prior chest radiographs of 11/09/2021. This may reflect interstitial edema or sequela of atypical/viral pneumonia. Aortic Atherosclerosis (ICD10-I70.0). Electronically Signed   By: Kellie Simmering D.O.  On: 04/06/2022 13:52   CT CHEST LUNG CA SCREEN LOW DOSE W/O CM  Result Date: 03/26/2022 CLINICAL DATA:  Lung cancer screening. Current smoker with 34 pack-year history. Asymptomatic. EXAM: CT CHEST WITHOUT CONTRAST LOW-DOSE FOR LUNG CANCER SCREENING TECHNIQUE: Multidetector CT imaging of the chest was performed following the standard protocol without IV contrast. RADIATION DOSE REDUCTION: This exam was performed according to the departmental dose-optimization program which includes automated exposure control, adjustment of the mA and/or kV according to patient size and/or use of iterative reconstruction technique. COMPARISON:  03/21/2021 FINDINGS: Cardiovascular: Normal heart size. No pericardial effusion. Aortic atherosclerosis and coronary artery calcifications. Mediastinum/Nodes: Thyroid gland, trachea, and esophagus are unremarkable. No mediastinal or axillary adenopathy. Lungs/Pleura: No pleural effusion or airspace disease. Scarring noted within the anterior right lung base. Subpleural fibrosis within the anterior left upper lobe noted. Centrilobular emphysema. Stable appearance of lung nodules. The largest is in the right apex with a mean derived diameter of 4.9 mm. No new or suspicious lung nodules. Upper Abdomen: No acute abnormality.  Status post cholecystectomy. Musculoskeletal: No chest wall mass or suspicious bone lesions identified. IMPRESSION: 1. Lung-RADS 2, benign appearance or behavior. Continue annual screening with  low-dose chest CT without contrast in 12 months. 2. Coronary artery calcifications. 3. Aortic Atherosclerosis (ICD10-I70.0) and Emphysema (ICD10-J43.9). Electronically Signed   By: Kerby Moors M.D.   On: 03/26/2022 09:29    Microbiology: Results for orders placed or performed during the hospital encounter of 04/06/22  Blood Culture (routine x 2)     Status: None   Collection Time: 04/06/22  1:50 PM   Specimen: BLOOD  Result Value Ref Range Status   Specimen Description   Final    BLOOD RIGHT ANTECUBITAL Performed at [facility], [address] [place], [place] [address]    Special Requests   Final    BOTTLES DRAWN AEROBIC AND ANAEROBIC Blood Culture adequate volume Performed at [facility], [address], [place], [place] [address]    Culture   Final    NO GROWTH 5 DAYS Performed at [facility], [place] [address] [place], [place] [address]    Report Status 04/11/2022 FINAL  Final  Blood Culture (routine x 2)     Status: None   Collection Time: 04/06/22  1:50 PM   Specimen: BLOOD  Result Value Ref Range Status   Specimen Description   Final    BLOOD LEFT ANTECUBITAL Performed at [facility], [address], [place], [place] [address]    Special Requests   Final    BOTTLES DRAWN AEROBIC AND ANAEROBIC Blood Culture adequate volume Performed at [facility], [address], [place], [place] [address]    Culture   Final    NO GROWTH 5 DAYS Performed at [facility], [place] [address] [place], [place] [address]    Report Status 04/11/2022 FINAL  Final  Resp panel by RT-PCR (RSV, Flu A&B, Covid) Anterior Nasal Swab     Status: None   Collection Time: 04/06/22  2:09 PM   Specimen: Anterior Nasal Swab  Result Value Ref Range Status   SARS Coronavirus 2 by RT PCR NEGATIVE NEGATIVE Final    Comment: (NOTE) SARS-CoV-2 target nucleic acids are NOT DETECTED.  The SARS-CoV-2 RNA is generally detectable in upper respiratory specimens during the acute phase of infection.  The lowest concentration of SARS-CoV-2 viral copies this assay can detect is 138 copies/mL. A negative result does not preclude SARS-Cov-2 infection and should not  be used as the sole basis for treatment or other patient management decisions. A negative result may occur with  improper specimen collection/handling, submission of specimen other than nasopharyngeal swab, presence of viral mutation(s) within the areas targeted by this assay, and inadequate number of viral copies(<138 copies/mL). A negative result must be combined with clinical observations, patient history, and epidemiological information. The expected result is Negative.  Fact Sheet for Patients:  EntrepreneurPulse.com.au  Fact Sheet for Healthcare Providers:  IncredibleEmployment.be  This test is no t yet approved or cleared by the [place] FDA and  has been authorized for detection and/or diagnosis of SARS-CoV-2 by FDA under an Emergency Use Authorization (EUA). This EUA will remain  in effect (meaning this test can be used) for the duration of the COVID-19 declaration under Section 564(b)(1) of the Act, 21 U.S.C.section 360bbb-3(b)(1), unless the authorization is terminated  or revoked sooner.       Influenza A by PCR NEGATIVE NEGATIVE Final   Influenza B by PCR NEGATIVE NEGATIVE Final    Comment: (NOTE) The Xpert Xpress SARS-CoV-2/FLU/RSV plus assay is intended as an aid in the diagnosis of influenza from Nasopharyngeal swab specimens and should not be used as a sole basis for treatment. Nasal washings and aspirates are unacceptable for Xpert Xpress SARS-CoV-2/FLU/RSV testing.  Fact Sheet for Patients: EntrepreneurPulse.com.au  Fact Sheet for Healthcare Providers: IncredibleEmployment.be  This test is not yet approved or cleared by the [place] FDA and has been authorized for detection and/or diagnosis of SARS-CoV-2 by FDA  under an Emergency Use Authorization (EUA). This EUA will remain in effect (meaning this test can be used) for the duration of the COVID-19 declaration under Section 564(b)(1) of the Act, 21 U.S.C. section 360bbb-3(b)(1), unless the authorization is terminated or revoked.     Resp Syncytial Virus by PCR NEGATIVE NEGATIVE Final    Comment: (NOTE) Fact Sheet for Patients: EntrepreneurPulse.com.au  Fact Sheet for Healthcare Providers: IncredibleEmployment.be  This test is not yet approved or cleared by the [place] FDA and has been authorized for detection and/or diagnosis of SARS-CoV-2 by FDA under an Emergency Use Authorization (EUA). This EUA will remain in effect (meaning this test can be used) for the duration of the COVID-19 declaration under Section 564(b)(1) of the Act, 21 U.S.C. section 360bbb-3(b)(1), unless the authorization is terminated or revoked.  Performed at [facility], [address] [place], [place] [address]   Urine Culture     Status: None   Collection Time: 04/06/22  7:58 PM   Specimen: Urine, Random  Result Value Ref Range Status   Specimen Description   Final    URINE, RANDOM Performed at [facility], [address] [place], [place] [address]    Special Requests URINE, CLEAN CATCH  Final   Culture   Final    NO GROWTH Performed at [facility], [place] [address] [place], [place] [address]    Report Status 04/08/2022 FINAL  Final  Expectorated Sputum Assessment w Gram Stain, Rflx to Resp Cult     Status: None   Collection Time: 04/06/22  9:19 PM   Specimen: Sputum  Result Value Ref Range Status   Specimen Description SPUTUM  Final   Special Requests NONE  Final   Sputum evaluation   Final    THIS SPECIMEN IS ACCEPTABLE FOR SPUTUM CULTURE PERORMED AT [facility] Performed at [facility], [address], [place], [place] [address]    Report Status 04/06/2022 FINAL  Final  Culture,  Respiratory w Gram Stain      Status: None   Collection Time: 04/06/22  9:19 PM   Specimen: SPU  Result Value Ref Range Status   Specimen Description   Final    SPUTUM Performed at [facility], [address], [place], [place] [address]    Special Requests   Final    NONE Reflexed from [id number] Performed at [facility], [address] [place], [place] [address]    Gram Stain   Final    MODERATE GRAM POSITIVE COCCI FEW GRAM NEGATIVE RODS RARE SQUAMOUS EPITHELIAL CELLS PRESENT RARE WBC PRESENT, PREDOMINANTLY PMN    Culture   Final    MODERATE Normal respiratory flora-no Staph aureus or Pseudomonas seen Performed at [facility], [address] [place], [place] [address]    Report Status 04/09/2022 FINAL  Final  MRSA Next Gen by PCR, Nasal     Status: None   Collection Time: 04/07/22 12:22 PM   Specimen: Nasal Mucosa; Nasal Swab  Result Value Ref Range Status   MRSA by PCR Next Gen NOT DETECTED NOT DETECTED Final    Comment: (NOTE) The GeneXpert MRSA Assay (FDA approved for NASAL specimens only), is one component of a comprehensive MRSA colonization surveillance program. It is not intended to diagnose MRSA infection nor to guide or monitor treatment for MRSA infections. Test performance is not FDA approved in patients less than 76 years old. Performed at [facility], [address], [place],  [address]    *Note: Due to a large number of results and/or encounters for the requested time period, some results have not been displayed. A complete set of results can be found in Results Review.    Labs: CBC: Recent Labs  Lab 04/13/22 0411 04/14/22 0512 04/15/22 0405 04/16/22 0435 04/17/22 0444  WBC 17.8* 16.7* 16.5* 23.0* 18.6*  HGB 15.6* 15.6* 15.3* 15.6* 15.3*  HCT 49.1* 50.7* 48.7* 49.3* 48.4*  MCV 89.6 91.4 90.7 89.8 89.8  PLT 457* 470* 460* 464* [id number]*   Basic Metabolic Panel: Recent Labs  Lab 04/11/22 0830 04/12/22 0409 04/13/22 0649 04/14/22 0512 04/15/22 0405 04/16/22 0435  04/17/22 0444  NA  --    < > 139 139 140 136 134*  K  --    < > 4.3 4.6 4.9 5.3* 5.5*  CL  --    < > 100 99 100 98 99  CO2  --    < > 32 34* 33* 30 28  GLUCOSE  --    < > 119* 128* 98 128* 86  BUN  --    < > 33* 42* 37* 38* 40*  CREATININE  --    < > 0.85 0.94 0.81 0.93 1.01*  CALCIUM  --    < > 9.9 10.1 10.1 10.1 10.6*  MG 1.8  --   --   --   --   --   --    < > = values in this interval not displayed.   Liver Function Tests: No results for input(s): "AST", "ALT", "ALKPHOS", "BILITOT", "PROT", "ALBUMIN" in the last 168 hours. CBG: Recent Labs  Lab 04/16/22 0809 04/16/22 1219 04/16/22 1750 04/16/22 2023 04/17/22 0723  GLUCAP 98 123* 124* 150* 99    Discharge time spent: greater than 30 minutes.  Signed: Deatra James, MD [facility] 04/17/2022

## 2022-04-17 NOTE — Progress Notes (Signed)
   04/17/22 1213  ReDS Vest / Clip  Station Marker B  Ruler Value 35  ReDS Value Range < 36  ReDS Actual Value 19

## 2022-04-17 NOTE — Progress Notes (Addendum)
SATURATION QUALIFICATIONS: (This note is used to comply with regulatory documentation for home oxygen)  Patient Saturations on Room Air at Rest = 96%  Patient Saturations on Room Air while Ambulating = 83%  Patient Saturations on 1.5 Liters of oxygen while Ambulating = 96%  Please briefly explain why patient needs home oxygen:

## 2022-04-18 ENCOUNTER — Other Ambulatory Visit: Payer: Self-pay | Admitting: Family Medicine

## 2022-04-18 ENCOUNTER — Telehealth: Payer: Self-pay

## 2022-04-18 DIAGNOSIS — J9601 Acute respiratory failure with hypoxia: Secondary | ICD-10-CM | POA: Diagnosis not present

## 2022-04-18 DIAGNOSIS — J441 Chronic obstructive pulmonary disease with (acute) exacerbation: Secondary | ICD-10-CM | POA: Diagnosis not present

## 2022-04-18 DIAGNOSIS — M5410 Radiculopathy, site unspecified: Secondary | ICD-10-CM | POA: Diagnosis not present

## 2022-04-18 DIAGNOSIS — I5021 Acute systolic (congestive) heart failure: Secondary | ICD-10-CM | POA: Diagnosis not present

## 2022-04-18 DIAGNOSIS — J4489 Other specified chronic obstructive pulmonary disease: Secondary | ICD-10-CM

## 2022-04-18 DIAGNOSIS — R918 Other nonspecific abnormal finding of lung field: Secondary | ICD-10-CM

## 2022-04-18 NOTE — Transitions of Care (Post Inpatient/ED Visit) (Signed)
   04/18/2022  Name: [name] MRN: [id number] DOB: [birth year]  Today's TOC FU Call Status: Today's TOC FU Call Status:: Successful TOC FU Call Competed TOC FU Call Complete Date: 04/18/22  Transition Care Management Follow-up Telephone Call Date of Discharge: 04/17/22 [facility] Type of Discharge: Inpatient Admission Primary Inpatient Discharge Diagnosis:: Acute Hypoxic Respiratory Failure How have you been since you were released from the hospital?: Better (Per patients Grand-daughter, [name]) Any questions or concerns?: No  Items Reviewed: Did you receive and understand the discharge instructions provided?: Yes Medications obtained and verified?: Yes (Medications Reviewed) Any new allergies since your discharge?: No Dietary orders reviewed?: Yes Type of Diet Ordered:: Low Sodium, Heart Healthy Do you have support at home?: Yes People in Home: grandchild(ren) Name of Support/Comfort Primary Source: [place] and Equipment/Supplies: [place] Ordered?: No (Family refused) Any new equipment or medical supplies ordered?: Yes Name of Medical supply agency?: [employer] Were you able to get the equipment/medical supplies?: No ([employer] delivering walker and commode today, 04/18/22) Do you have any questions related to the use of the equipment/supplies?: No (Provided this writers direct phone number if she has issues with DME)  Functional Questionnaire: Do you need assistance with bathing/showering or dressing?: Yes Do you need assistance with meal preparation?: Yes Do you need assistance with eating?: No Do you have difficulty maintaining continence: No Do you need assistance with getting out of bed/getting out of a chair/moving?: Yes Do you have difficulty managing or taking your medications?: Yes  Follow up appointments reviewed: PCP Follow-up appointment confirmed?: Yes Date of PCP follow-up appointment?:  05/17/22 Follow-up Provider: Fredderick Severance, NP [facility] Follow-up appointment confirmed?: NA Do you need transportation to your follow-up appointment?: No Do you understand care options if your condition(s) worsen?: Yes-patient verbalized understanding  SDOH Interventions Today    Flowsheet Row Most Recent Value  SDOH Interventions   Food Insecurity Interventions Intervention Not Indicated  Housing Interventions Intervention Not Indicated      Interventions Today    Flowsheet Row Most Recent Value  Chronic Disease   Chronic disease during today's visit Other  [Acute Hypoxic Respiratory Failure]  General Interventions   General Interventions Discussed/Reviewed General Interventions Discussed  Pharmacy Interventions   Pharmacy Dicussed/Reviewed Pharmacy Topics Discussed  Safety Interventions   Safety Discussed/Reviewed Safety Discussed, Fall Risk       Johnney Killian, RN, BSN, CCM Care Management Coordinator [facility] Phone: [phone]: [phone]

## 2022-04-19 ENCOUNTER — Other Ambulatory Visit: Payer: Self-pay | Admitting: Family Medicine

## 2022-04-20 DIAGNOSIS — J441 Chronic obstructive pulmonary disease with (acute) exacerbation: Secondary | ICD-10-CM | POA: Diagnosis not present

## 2022-04-20 DIAGNOSIS — J9601 Acute respiratory failure with hypoxia: Secondary | ICD-10-CM | POA: Diagnosis not present

## 2022-04-20 DIAGNOSIS — I5021 Acute systolic (congestive) heart failure: Secondary | ICD-10-CM | POA: Diagnosis not present

## 2022-04-20 DIAGNOSIS — M5410 Radiculopathy, site unspecified: Secondary | ICD-10-CM | POA: Diagnosis not present

## 2022-04-20 NOTE — Progress Notes (Signed)
Patient notified of LDCT Lung Cancer Screening Results via mail with the recommendation to follow-up in 12 months. Patient's referring provider has been sent a copy of results. Results are as follows:  ° °IMPRESSION: °1. Lung-RADS 2, benign appearance or behavior. Continue annual °screening with low-dose chest CT without contrast in 12 months. °2. Coronary artery calcifications. °3. Aortic Atherosclerosis (ICD10-I70.0) and Emphysema (ICD10-J43.9). °

## 2022-04-23 ENCOUNTER — Telehealth: Payer: Self-pay | Admitting: Family Medicine

## 2022-04-23 NOTE — Telephone Encounter (Signed)
Patient granddaughter called said patient was discharged from hospital on 04.02.2024, appointment scheduled with Dr Barbaraann Faster on 04.10.2024. to send prescription for hospital bed to case worker Maryellen Pile phone # [phone].

## 2022-04-25 ENCOUNTER — Telehealth (INDEPENDENT_AMBULATORY_CARE_PROVIDER_SITE_OTHER): Payer: 59 | Admitting: Internal Medicine

## 2022-04-25 ENCOUNTER — Encounter: Payer: Self-pay | Admitting: Internal Medicine

## 2022-04-25 DIAGNOSIS — J9601 Acute respiratory failure with hypoxia: Secondary | ICD-10-CM | POA: Diagnosis not present

## 2022-04-25 NOTE — Patient Instructions (Addendum)
Thank you, Ms.[name] for allowing [place] to provide your care today.   I have ordered the following for you:  [facility] PT/OT Hospital Bed  Follow up in 1 month in person  Thurmon Fair, M.D.

## 2022-04-25 NOTE — Assessment & Plan Note (Addendum)
Patient had approximately a week and half stay in hospital for acute hypoxic and hypercapnic respiratory failure. Stable since discharge. She is deconditioned and unable to get into her bed safely or leave her home. She is still on 2L supplemental oxygen at home. Family is going to purchase a pulse oximeter so they could give [place] more information on her oxygen requirement. Orders placed for hospital bed and home health PT/OT. Follow up in person in one month.

## 2022-04-25 NOTE — Progress Notes (Signed)
Virtual Visit via Video Note  I connected with [name] on 04/25/22 at 10:20 AM EDT by a video enabled telemedicine application and verified that I am speaking with the correct person using two identifiers.  Patient Location: Home Provider Location: Office/Clinic  I discussed the limitations, risks, security, and privacy concerns of performing an evaluation and management service by video and the availability of in person appointments. I also discussed with the patient that there may be a patient responsible charge related to this service. The patient expressed understanding and agreed to proceed.  Subjective: PCP: Kerri Perches, MD  Chief Complaint  Patient presents with   Transitions Of Care    D/c 4/2 from [facility] for resp failure. Requests hosp bed    [name] is a 77 year old who presents for Transitions Of Care (D/c 4/2 from [facility] for resp failure. Requests hosp bed ). Patient has been cared for by her.granddaughter since discharge and is part of conversation today.Patient is still on 2L supplemental oxygen. She needs assistance when getting into bed and family is having a hard time getting patient into her bed. She is using a walker when ambulating. She was referred to outpatient PT but could not start until May. Patient is unable to leave her home without assistance and becomes short of breath when ambulating. She has stopped smoking since her discharge and using nicotine patch. She has finished course of prednisone. No fever , chill, worsened shortness of breath, or weight gain.    ROS: Per HPI  Current Outpatient Medications:    acetaminophen (TYLENOL) 325 MG tablet, Take 650 mg by mouth every 6 (six) hours as needed for moderate pain. , Disp: , Rfl:    albuterol (PROVENTIL) (2.5 MG/3ML) 0.083% nebulizer solution, INHALE ONE VIAL VIA NEBULIZER THREE TIMES DAILY. (Patient taking differently: Take 2.5 mg by nebulization 3 (three) times daily as needed for  wheezing or shortness of breath.), Disp: 300 mL, Rfl: 0   albuterol (VENTOLIN HFA) 108 (90 Base) MCG/ACT inhaler, INHALE (2) PUFFS EVERY FOURHOURS AS NEEDED ONLY IF YOU CANT CATCH YOUR BREATH (Patient taking differently: Inhale 2 puffs into the lungs every 4 (four) hours as needed for wheezing or shortness of breath. INHALE (2) PUFFS EVERY FOURHOURS AS NEEDED ONLY IF YOU CANT CATCH YOUR BREATH), Disp: 8.5 g, Rfl: 0   amiodarone (PACERONE) 200 MG tablet, Take 1 tablet (200 mg total) by mouth 2 (two) times daily for 5 days, THEN 1 tablet (200 mg total) daily., Disp: 40 tablet, Rfl: 0   apixaban (ELIQUIS) 5 MG TABS tablet, Take 1 tablet (5 mg total) by mouth 2 (two) times daily., Disp: 60 tablet, Rfl: 2   bisoprolol (ZEBETA) 5 MG tablet, Take 1 tablet (5 mg total) by mouth daily., Disp: 30 tablet, Rfl: 1   Calcium Carbonate-Vitamin D (CALCIUM 600 + D PO), Take 1 tablet by mouth 2 (two) times daily., Disp: , Rfl:    Cyanocobalamin (VITAMIN B 12) 500 MCG TABS, Take 500 mcg by mouth daily. Take one tablet by mouth once daily, Disp: 90 tablet, Rfl: 3   cyclobenzaprine (FLEXERIL) 5 MG tablet, Take 1 tablet (5 mg total) by mouth 3 (three) times daily as needed for muscle spasms., Disp: 30 tablet, Rfl: 0   dapagliflozin propanediol (FARXIGA) 10 MG TABS tablet, Take 1 tablet (10 mg total) by mouth daily., Disp: 30 tablet, Rfl: 1   fluticasone (FLONASE) 50 MCG/ACT nasal spray, PLACE 2 SPRAYS IN  EACH NOSTRIL DAILY., Disp: 16 g, Rfl: 0   ipratropium (ATROVENT HFA) 17 MCG/ACT inhaler, INHALE 2 PUFFS BY MOUTH FOUR TIMES A DAY., Disp: 12.9 g, Rfl: 0   Misc. Devices MISC, Please provide patient with mastectomy bra and prosthesis. Dx: History of left breast cancer, Disp: 1 each, Rfl: 0   Naphazoline HCl (CLEAR EYES OP), Place 1 drop into both eyes daily as needed (irritation)., Disp: , Rfl:    nicotine (NICODERM CQ - DOSED IN MG/24 HR) 7 mg/24hr patch, APPLY 1 PATCH ONTO THE SKIN ONCE DAILY, Disp: 28 patch, Rfl: 0    pantoprazole (PROTONIX) 40 MG tablet, TAKE ONE TABLET BY MOUTH ONCE DAILY BEFORE BREAKFAST., Disp: 90 tablet, Rfl: 0   polyethylene glycol powder (GLYCOLAX/MIRALAX) powder, MIX 1 CAPFUL IN 8 OUNCES OF JUICE OR WATER AND DRINK ONCE DAILY. (Patient taking differently: Take 17 g by mouth daily.), Disp: 527 g, Rfl: 5   rosuvastatin (CRESTOR) 10 MG tablet, TAKE 1 TABLET BY MOUTH ONCE A DAY. (Patient taking differently: Take 10 mg by mouth daily.), Disp: 90 tablet, Rfl: 1   sacubitril-valsartan (ENTRESTO) 24-26 MG, Take 1 tablet by mouth 2 (two) times daily., Disp: 60 tablet, Rfl: 1   spironolactone (ALDACTONE) 25 MG tablet, Take 1 tablet (25 mg total) by mouth daily., Disp: 30 tablet, Rfl: 0   SYMBICORT 160-4.5 MCG/ACT inhaler, INHALE 2 PUFFS INTO THE LUNGS TWICE DAILY., Disp: 10.2 g, Rfl: 0   traMADol (ULTRAM) 50 MG tablet, One tablet every eight hours as needed for pain. (Patient taking differently: Take 50 mg by mouth every 8 (eight) hours as needed for moderate pain or severe pain. One tablet every eight hours as needed for pain.), Disp: 60 tablet, Rfl: 3  Observations/Objective: There were no vitals filed for this visit.   Assessment and Plan: Acute respiratory failure with hypoxia Assessment & Plan: Patient had approximately a week and half stay in hospital for acute hypoxic and hypercapnic respiratory failure. Stable since discharge. She is deconditioned and unable to get into her bed safely or leave her home. She is still on 2L supplemental oxygen at home. Family is going to purchase a pulse oximeter so they could give [place] more information on her oxygen requirement. Orders placed for hospital bed and home health PT/OT. Follow up in person in one month.       Follow Up Instructions: Return in about 4 weeks (around 05/23/2022).   I discussed the assessment and treatment plan with the patient. The patient was provided an opportunity to ask questions, and all were answered. The patient agreed  with the plan and demonstrated an understanding of the instructions.   The patient was advised to call back or seek an in-person evaluation if the symptoms worsen or if the condition fails to improve as anticipated.  The above assessment and management plan was discussed with the patient. The patient verbalized understanding of and has agreed to the management plan.   Milus Banister, MD

## 2022-04-26 ENCOUNTER — Telehealth: Payer: Self-pay | Admitting: Family Medicine

## 2022-04-26 DIAGNOSIS — I5021 Acute systolic (congestive) heart failure: Secondary | ICD-10-CM | POA: Diagnosis not present

## 2022-04-26 DIAGNOSIS — J449 Chronic obstructive pulmonary disease, unspecified: Secondary | ICD-10-CM | POA: Diagnosis not present

## 2022-04-26 NOTE — Telephone Encounter (Signed)
See other tele msg  

## 2022-04-26 NOTE — Telephone Encounter (Signed)
Ok to send a rx for a hospital bed ?

## 2022-04-26 NOTE — Telephone Encounter (Signed)
Called in on patient behalf in regard to visit 4/10 . States [name] w. Aging and disability has not received request for hospital bed . Wants a call back in regard

## 2022-05-01 DIAGNOSIS — Z7951 Long term (current) use of inhaled steroids: Secondary | ICD-10-CM | POA: Diagnosis not present

## 2022-05-01 DIAGNOSIS — Z9981 Dependence on supplemental oxygen: Secondary | ICD-10-CM | POA: Diagnosis not present

## 2022-05-01 DIAGNOSIS — J9601 Acute respiratory failure with hypoxia: Secondary | ICD-10-CM | POA: Diagnosis not present

## 2022-05-01 DIAGNOSIS — Z7901 Long term (current) use of anticoagulants: Secondary | ICD-10-CM | POA: Diagnosis not present

## 2022-05-07 DIAGNOSIS — J9601 Acute respiratory failure with hypoxia: Secondary | ICD-10-CM | POA: Diagnosis not present

## 2022-05-07 DIAGNOSIS — Z9981 Dependence on supplemental oxygen: Secondary | ICD-10-CM | POA: Diagnosis not present

## 2022-05-07 DIAGNOSIS — Z7951 Long term (current) use of inhaled steroids: Secondary | ICD-10-CM | POA: Diagnosis not present

## 2022-05-07 DIAGNOSIS — Z7901 Long term (current) use of anticoagulants: Secondary | ICD-10-CM | POA: Diagnosis not present

## 2022-05-08 ENCOUNTER — Telehealth: Payer: Self-pay | Admitting: Family Medicine

## 2022-05-08 DIAGNOSIS — J9601 Acute respiratory failure with hypoxia: Secondary | ICD-10-CM | POA: Diagnosis not present

## 2022-05-08 DIAGNOSIS — Z7901 Long term (current) use of anticoagulants: Secondary | ICD-10-CM | POA: Diagnosis not present

## 2022-05-08 DIAGNOSIS — Z9981 Dependence on supplemental oxygen: Secondary | ICD-10-CM | POA: Diagnosis not present

## 2022-05-08 DIAGNOSIS — Z7951 Long term (current) use of inhaled steroids: Secondary | ICD-10-CM | POA: Diagnosis not present

## 2022-05-08 NOTE — Telephone Encounter (Signed)
Hospice will go out and see pt on Thursday.

## 2022-05-08 NOTE — Telephone Encounter (Signed)
Follow up with [facility] in regards to pt. NP Coralee Rud [phone]

## 2022-05-10 DIAGNOSIS — J9610 Chronic respiratory failure, unspecified whether with hypoxia or hypercapnia: Secondary | ICD-10-CM | POA: Diagnosis not present

## 2022-05-10 DIAGNOSIS — Z515 Encounter for palliative care: Secondary | ICD-10-CM | POA: Diagnosis not present

## 2022-05-10 DIAGNOSIS — J449 Chronic obstructive pulmonary disease, unspecified: Secondary | ICD-10-CM | POA: Diagnosis not present

## 2022-05-10 DIAGNOSIS — I5022 Chronic systolic (congestive) heart failure: Secondary | ICD-10-CM | POA: Diagnosis not present

## 2022-05-11 DIAGNOSIS — Z7951 Long term (current) use of inhaled steroids: Secondary | ICD-10-CM | POA: Diagnosis not present

## 2022-05-11 DIAGNOSIS — Z7901 Long term (current) use of anticoagulants: Secondary | ICD-10-CM | POA: Diagnosis not present

## 2022-05-11 DIAGNOSIS — Z9981 Dependence on supplemental oxygen: Secondary | ICD-10-CM | POA: Diagnosis not present

## 2022-05-11 DIAGNOSIS — J9601 Acute respiratory failure with hypoxia: Secondary | ICD-10-CM | POA: Diagnosis not present

## 2022-05-14 DIAGNOSIS — J9601 Acute respiratory failure with hypoxia: Secondary | ICD-10-CM | POA: Diagnosis not present

## 2022-05-14 DIAGNOSIS — Z9981 Dependence on supplemental oxygen: Secondary | ICD-10-CM | POA: Diagnosis not present

## 2022-05-14 DIAGNOSIS — Z7901 Long term (current) use of anticoagulants: Secondary | ICD-10-CM | POA: Diagnosis not present

## 2022-05-14 DIAGNOSIS — Z7951 Long term (current) use of inhaled steroids: Secondary | ICD-10-CM | POA: Diagnosis not present

## 2022-05-15 DIAGNOSIS — E118 Type 2 diabetes mellitus with unspecified complications: Secondary | ICD-10-CM | POA: Diagnosis not present

## 2022-05-16 ENCOUNTER — Other Ambulatory Visit: Payer: Self-pay | Admitting: Family Medicine

## 2022-05-16 DIAGNOSIS — Z7951 Long term (current) use of inhaled steroids: Secondary | ICD-10-CM | POA: Diagnosis not present

## 2022-05-16 DIAGNOSIS — J9601 Acute respiratory failure with hypoxia: Secondary | ICD-10-CM | POA: Diagnosis not present

## 2022-05-16 DIAGNOSIS — R918 Other nonspecific abnormal finding of lung field: Secondary | ICD-10-CM

## 2022-05-16 DIAGNOSIS — Z9981 Dependence on supplemental oxygen: Secondary | ICD-10-CM | POA: Diagnosis not present

## 2022-05-16 DIAGNOSIS — J4489 Other specified chronic obstructive pulmonary disease: Secondary | ICD-10-CM

## 2022-05-16 DIAGNOSIS — Z7901 Long term (current) use of anticoagulants: Secondary | ICD-10-CM | POA: Diagnosis not present

## 2022-05-17 ENCOUNTER — Ambulatory Visit: Payer: 59 | Attending: Student | Admitting: Student

## 2022-05-17 ENCOUNTER — Encounter: Payer: Self-pay | Admitting: Student

## 2022-05-17 VITALS — BP 126/66 | HR 62 | Ht 65.0 in | Wt 151.0 lb

## 2022-05-17 DIAGNOSIS — J4489 Other specified chronic obstructive pulmonary disease: Secondary | ICD-10-CM | POA: Diagnosis not present

## 2022-05-17 DIAGNOSIS — Z79899 Other long term (current) drug therapy: Secondary | ICD-10-CM | POA: Diagnosis not present

## 2022-05-17 DIAGNOSIS — I4819 Other persistent atrial fibrillation: Secondary | ICD-10-CM

## 2022-05-17 DIAGNOSIS — I5022 Chronic systolic (congestive) heart failure: Secondary | ICD-10-CM

## 2022-05-17 DIAGNOSIS — I2510 Atherosclerotic heart disease of native coronary artery without angina pectoris: Secondary | ICD-10-CM | POA: Diagnosis not present

## 2022-05-17 DIAGNOSIS — E785 Hyperlipidemia, unspecified: Secondary | ICD-10-CM

## 2022-05-17 DIAGNOSIS — I10 Essential (primary) hypertension: Secondary | ICD-10-CM | POA: Diagnosis not present

## 2022-05-17 NOTE — Progress Notes (Signed)
Cardiology Office Note    Date:  05/17/2022  ID:  [name], DOB 05/18/1945, MRN [id number] Cardiologist: Dina Rich, MD    History of Present Illness:    [name] is a 77 y.o. female with past medical history of coronary calcification by CT (prior NST showing mild apical ischemia but low risk study), aortic atherosclerosis, history of breast cancer, COPD, HTN, HLD and Type II DM who presents to the office today for hospital follow-up.  She was admitted to [facility] in 03/2022 for evaluation of a worsening cough and dyspnea, diagnosed with an acute COPD exacerbation and presumed pneumonia. She initially required BiPAP but this was weaned during her admission. Cardiology was consulted for new-onset atrial fibrillation with RVR and she required the use of IV Amiodarone which was transitioned to 200 mg twice daily for 3 weeks followed by 200 mg daily. Repeat echocardiogram also showed her EF was reduced at 35 to 40% with wall motion abnormalities. It was recommended to pursue medical therapy at that time for cardiomyopathy in the setting of her multiple medical issues and frail state but could consider ischemic evaluation as an outpatient. She was started on Bisoprolol, Entresto, Aldactone and [place]. In regards to her atrial fibrillation, it was recommended to consider DCCV as an outpatient. She was also followed by Palliative Care during admission with plans for outpatient palliative services.  In talking with the patient and her granddaughter today, she reports she has experienced improvement in her symptoms since returning home. She is still on supplemental oxygen and is hopeful this can be weaned at her follow-up with her PCP or Pulmonology in the next few weeks. She denies any specific orthopnea, PND or pitting edema. No recent chest pain or palpitations. She remains on Eliquis with no reports of active bleeding. She is being followed by Palliative services and also has Home Health  PT. Reports still feeling weak but this is starting to improve. Family members are monitoring her sodium intake closely.  Studies Reviewed:   EKG: EKG is ordered today and demonstrates NSR, HR 62 with no acute ST changes,   NST: 11/2017 There was no ST segment deviation noted during stress. This is a low risk study. Very mild apical defect likely due to apical thinning, less likely to be very mild apical ischemia. Either finding supports low risk. The left ventricular ejection fraction is normal (55-65%). The study is normal.    Echocardiogram: 03/2022 IMPRESSIONS     1. Left ventricular ejection fraction, by estimation, is 35 to 40%. The  left ventricle has moderately decreased function. The left ventricle  demonstrates regional wall motion abnormalities (see scoring  diagram/findings for description). There is mild  concentric left ventricular hypertrophy. Left ventricular diastolic  parameters are indeterminate.   2. Right ventricular systolic function is normal. The right ventricular  size is normal. Tricuspid regurgitation signal is inadequate for assessing  PA pressure.   3. The mitral valve is grossly normal. Trivial mitral valve  regurgitation.   4. The aortic valve is tricuspid. There is mild calcification of the  aortic valve. Aortic valve regurgitation is not visualized. Aortic valve  sclerosis is present, with no evidence of aortic valve stenosis.   5. The inferior vena cava is normal in size with greater than 50%  respiratory variability, suggesting right atrial pressure of 3 mmHg.   Comparison(s): No prior Echocardiogram.   Physical Exam:   VS:  BP 126/66   Pulse 62   Ht  5\' 5"  (1.651 m)   Wt 151 lb (68.5 kg)   SpO2 97%   BMI 25.13 kg/m    Wt Readings from Last 3 Encounters:  05/17/22 151 lb (68.5 kg)  04/08/22 162 lb 7.7 oz (73.7 kg)  01/30/22 160 lb (72.6 kg)     GEN: Pleasant, elderly female appearing in no acute distress NECK: No JVD; No carotid  bruits CARDIAC: RRR, no murmurs, rubs, gallops RESPIRATORY:  Clear to auscultation without rales, wheezing or rhonchi. On supplemental oxygen. ABDOMEN: Appears non-distended. No obvious abdominal masses. EXTREMITIES: No clubbing or cyanosis. No pitting edema.  Distal pedal pulses are 2+ bilaterally.   Assessment and Plan:   1. Chronic HFrEF - Recent echocardiogram showed her EF was reduced at 35 to 40% and it is unclear if this is ischemic or nonischemic in the setting of atrial fibrillation with RVR at that time. - She reports her weight has declined by 20 pounds over the past month and she appears euvolemic by examination today. Will continue current medical therapy with Bisoprolol 5 mg daily, Farxiga 10 mg daily, Entresto 24-26 mg twice daily and Spironolactone 25 mg daily. Will stop Amlodipine and they were provided with a BP log today. Pending her BP trend, would like to further titrate Bisoprolol or Entresto. Will plan to obtain follow-up labs with a repeat CBC and BMET. - Discussed options with the patient and her family today and she prefers conservative management as compared to ischemic evaluation at this time. She is in agreement for a follow-up echocardiogram in 2 months for reassessment and if EF remains reduced, would readdress at that time.   2. Persistent Atrial Fibrillation - Repeat EKG today shows that she has converted back to normal sinus rhythm since her hospitalization. She has already reduced Amiodarone to 200 mg daily and will continue on this.  Will also continue Bisoprolol 5 mg daily for rate-control. - No reports of active bleeding. Continue Eliquis 5 mg twice daily for anticoagulation. Will recheck a CBC and BMET with upcoming labs.   3. Coronary Calcification by CT - Prior NST in 2021 was a low-risk study. Discussed possible ischemic evaluation with the patient and her granddaughter as discussed above and she prefers conservative management at this time. - Continue  current medical therapy with Bisoprolol 5 mg daily and Crestor 10 mg daily. She is not on ASA given the need for anticoagulation.  4. HTN - Her blood pressure is at 126/66 during today's visit but they report this has been soft at times when checked at home and she has lost over 20 pounds in the past month. Will stop Amlodipine as discussed above to see if this will allow for further titration of GDMT.  5. HLD - FLP in 03/2022 showed total cholesterol 86, triglycerides 15, HDL 47 and LDL 36. Continue Crestor 10 mg daily.  6. COPD - Prior PFT's in 09/2021 showed severe obstructive airway disease. She is followed by Dr. Sherene Sires.  Signed, Ellsworth Lennox, PA-C

## 2022-05-17 NOTE — Patient Instructions (Signed)
Medication Instructions:   STOP Amlodipine   Labwork:  BMET , CBC next week at [facility]  Testing/Procedures:  LIMITED Echo in 2 months  Follow-Up: After Echo in 2 months  Any Other Special Instructions Will Be Listed Below (If Applicable).  If you need a refill on your cardiac medications before your next appointment, please call your pharmacy.

## 2022-05-18 DIAGNOSIS — Z9981 Dependence on supplemental oxygen: Secondary | ICD-10-CM | POA: Diagnosis not present

## 2022-05-18 DIAGNOSIS — Z7901 Long term (current) use of anticoagulants: Secondary | ICD-10-CM | POA: Diagnosis not present

## 2022-05-18 DIAGNOSIS — Z7951 Long term (current) use of inhaled steroids: Secondary | ICD-10-CM | POA: Diagnosis not present

## 2022-05-18 DIAGNOSIS — J9601 Acute respiratory failure with hypoxia: Secondary | ICD-10-CM | POA: Diagnosis not present

## 2022-05-21 ENCOUNTER — Other Ambulatory Visit: Payer: Self-pay | Admitting: Family Medicine

## 2022-05-21 DIAGNOSIS — Z7901 Long term (current) use of anticoagulants: Secondary | ICD-10-CM | POA: Diagnosis not present

## 2022-05-21 DIAGNOSIS — Z9981 Dependence on supplemental oxygen: Secondary | ICD-10-CM | POA: Diagnosis not present

## 2022-05-21 DIAGNOSIS — J9601 Acute respiratory failure with hypoxia: Secondary | ICD-10-CM | POA: Diagnosis not present

## 2022-05-21 DIAGNOSIS — Z7951 Long term (current) use of inhaled steroids: Secondary | ICD-10-CM | POA: Diagnosis not present

## 2022-05-23 DIAGNOSIS — J9601 Acute respiratory failure with hypoxia: Secondary | ICD-10-CM | POA: Diagnosis not present

## 2022-05-23 DIAGNOSIS — Z9981 Dependence on supplemental oxygen: Secondary | ICD-10-CM | POA: Diagnosis not present

## 2022-05-23 DIAGNOSIS — Z7901 Long term (current) use of anticoagulants: Secondary | ICD-10-CM | POA: Diagnosis not present

## 2022-05-23 DIAGNOSIS — Z7951 Long term (current) use of inhaled steroids: Secondary | ICD-10-CM | POA: Diagnosis not present

## 2022-05-24 ENCOUNTER — Ambulatory Visit (INDEPENDENT_AMBULATORY_CARE_PROVIDER_SITE_OTHER): Payer: 59 | Admitting: Family Medicine

## 2022-05-24 ENCOUNTER — Encounter: Payer: Self-pay | Admitting: Family Medicine

## 2022-05-24 VITALS — BP 127/65 | HR 196 | Ht 65.0 in | Wt 154.1 lb

## 2022-05-24 DIAGNOSIS — I5021 Acute systolic (congestive) heart failure: Secondary | ICD-10-CM | POA: Diagnosis not present

## 2022-05-24 DIAGNOSIS — J9601 Acute respiratory failure with hypoxia: Secondary | ICD-10-CM | POA: Diagnosis not present

## 2022-05-24 DIAGNOSIS — R251 Tremor, unspecified: Secondary | ICD-10-CM | POA: Diagnosis not present

## 2022-05-24 DIAGNOSIS — R9389 Abnormal findings on diagnostic imaging of other specified body structures: Secondary | ICD-10-CM

## 2022-05-24 MED ORDER — CYCLOBENZAPRINE HCL 5 MG PO TABS: 5.0000 mg | ORAL_TABLET | Freq: Every day | ORAL | 5 refills | Status: DC

## 2022-05-24 NOTE — Patient Instructions (Addendum)
    F/u in 2 months  I will try to get a sooner appointment wuith Dr Sherene Sires for oxygen needs/ concentrate we  will try to get this if we cannot then I will attempt a sooner appt with Pulmonary  You are being referred to Neurology re tremor   Congrats on stopping smoking  Put as DPR Margaretmary Dys ( grand daughter) [phone]  We will contact re labs for Ms Peeks  Thanks for choosing [facility], we consider it a privelige to serve you.

## 2022-05-28 DIAGNOSIS — Z7901 Long term (current) use of anticoagulants: Secondary | ICD-10-CM | POA: Diagnosis not present

## 2022-05-28 DIAGNOSIS — Z9981 Dependence on supplemental oxygen: Secondary | ICD-10-CM | POA: Diagnosis not present

## 2022-05-28 DIAGNOSIS — J9601 Acute respiratory failure with hypoxia: Secondary | ICD-10-CM | POA: Diagnosis not present

## 2022-05-28 DIAGNOSIS — Z7951 Long term (current) use of inhaled steroids: Secondary | ICD-10-CM | POA: Diagnosis not present

## 2022-05-29 DIAGNOSIS — Z7901 Long term (current) use of anticoagulants: Secondary | ICD-10-CM

## 2022-05-29 DIAGNOSIS — Z7951 Long term (current) use of inhaled steroids: Secondary | ICD-10-CM

## 2022-05-29 DIAGNOSIS — J9601 Acute respiratory failure with hypoxia: Secondary | ICD-10-CM

## 2022-05-29 DIAGNOSIS — Z9981 Dependence on supplemental oxygen: Secondary | ICD-10-CM

## 2022-05-31 ENCOUNTER — Encounter: Payer: Self-pay | Admitting: Neurology

## 2022-05-31 ENCOUNTER — Encounter: Payer: Self-pay | Admitting: Family Medicine

## 2022-05-31 DIAGNOSIS — R251 Tremor, unspecified: Secondary | ICD-10-CM | POA: Insufficient documentation

## 2022-05-31 DIAGNOSIS — R9389 Abnormal findings on diagnostic imaging of other specified body structures: Secondary | ICD-10-CM | POA: Insufficient documentation

## 2022-05-31 NOTE — Assessment & Plan Note (Signed)
Progressively worsening tremor with increasing debility, refer Neurology

## 2022-05-31 NOTE — Assessment & Plan Note (Signed)
Abnormal CXR 4/01, unclear if left lower lung abscess, needs chest Ct scan

## 2022-05-31 NOTE — Assessment & Plan Note (Signed)
Oxygen dependent 24/7 with poor exercise tolerance

## 2022-05-31 NOTE — Assessment & Plan Note (Signed)
Has rept echo planned in 2 months  and cardiology f/u after this Not decompensated currently

## 2022-05-31 NOTE — Progress Notes (Signed)
   [name]     MRN: [id number]      DOB: September 21, 1945  Chief Complaint  Patient presents with   Follow-up    4 month follow up d/c from hospital in April now on oxygen    HPI [name] is here for follow up and re-evaluation of chronic medical conditions, medication management and review of any available recent lab and radiology data.  Currently totally oxygen dependent, and is needing a smaller oxygen unit for use  will see if pulmonary appt can be brought forward if unable to get one for her Increased tremor noted , difficulty feeding and dressing herself Has quit smoking She has been getting in home PT/OT ad notes some improvement in strength.New dx of acute heart failure with new onset a fibalso had CAPO on admission in March ROS Denies recent fever or chills. Denies sinus pressure, nasal congestion, ear pain or sore throat. Denies abdominal pain, nausea, vomiting,diarrhea or constipation.   Denies dysuria, frequency, hesitancy or incontinence. Chronic  joint pain,  and limitation in mobility. Denies headaches, seizures, numbness, or tingling. Denies depression, anxiety or insomnia. Denies skin break down or rash.   PE  BP 127/65 (BP Location: Right Arm, Patient Position: Sitting, Cuff Size: Normal)   Pulse (!) 196   Ht 5\' 5"  (1.651 m)   Wt 154 lb 1.9 oz (69.9 kg)   SpO2 90%   BMI 25.65 kg/m   Patient alert and oriented and in no cardiopulmonary distress.  HEENT: No facial asymmetry, EOMI,     Neck supple .  Chest: Clear to auscultation bilaterally.  CVS: S1, S2 no murmurs, no S3.Regular rate.  ABD: Soft non tender.   Ext: No edema  MS: Adequate ROM spine, shoulders, hips and knees.  Skin: Intact, no ulcerations or rash noted.  Psych: Good eye contact, normal affect. Memory intact  anxious not depressed appearing.  CNS: CN 2-12 intact, power,  normal throughout.no focal deficits noted.   Assessment & Plan  Acute HFrEF/systolic dysfunction CHF with EF  35 to 40% and regional wall motion normalities 6 Has rept echo planned in 2 months  and cardiology f/u after this Not decompensated currently  Acute respiratory failure with hypoxia (HCC) Oxygen dependent 24/7 with poor exercise tolerance  Abnormal CXR Abnormal CXR 4/01, unclear if left lower lung abscess, needs chest Ct scan  Tremor Progressively worsening tremor with increasing debility, refer Neurology

## 2022-06-06 DIAGNOSIS — J9601 Acute respiratory failure with hypoxia: Secondary | ICD-10-CM | POA: Diagnosis not present

## 2022-06-06 DIAGNOSIS — Z7951 Long term (current) use of inhaled steroids: Secondary | ICD-10-CM | POA: Diagnosis not present

## 2022-06-06 DIAGNOSIS — Z7901 Long term (current) use of anticoagulants: Secondary | ICD-10-CM | POA: Diagnosis not present

## 2022-06-06 DIAGNOSIS — Z9981 Dependence on supplemental oxygen: Secondary | ICD-10-CM | POA: Diagnosis not present

## 2022-06-08 DIAGNOSIS — I5022 Chronic systolic (congestive) heart failure: Secondary | ICD-10-CM | POA: Diagnosis not present

## 2022-06-08 DIAGNOSIS — J449 Chronic obstructive pulmonary disease, unspecified: Secondary | ICD-10-CM | POA: Diagnosis not present

## 2022-06-08 DIAGNOSIS — J9610 Chronic respiratory failure, unspecified whether with hypoxia or hypercapnia: Secondary | ICD-10-CM | POA: Diagnosis not present

## 2022-06-08 DIAGNOSIS — Z515 Encounter for palliative care: Secondary | ICD-10-CM | POA: Diagnosis not present

## 2022-06-15 DIAGNOSIS — Z9981 Dependence on supplemental oxygen: Secondary | ICD-10-CM | POA: Diagnosis not present

## 2022-06-15 DIAGNOSIS — Z7951 Long term (current) use of inhaled steroids: Secondary | ICD-10-CM | POA: Diagnosis not present

## 2022-06-15 DIAGNOSIS — Z7901 Long term (current) use of anticoagulants: Secondary | ICD-10-CM | POA: Diagnosis not present

## 2022-06-15 DIAGNOSIS — J9601 Acute respiratory failure with hypoxia: Secondary | ICD-10-CM | POA: Diagnosis not present

## 2022-06-15 DIAGNOSIS — E118 Type 2 diabetes mellitus with unspecified complications: Secondary | ICD-10-CM | POA: Diagnosis not present

## 2022-06-16 ENCOUNTER — Other Ambulatory Visit: Payer: Self-pay | Admitting: Family Medicine

## 2022-06-16 DIAGNOSIS — M5410 Radiculopathy, site unspecified: Secondary | ICD-10-CM | POA: Diagnosis not present

## 2022-06-16 DIAGNOSIS — J441 Chronic obstructive pulmonary disease with (acute) exacerbation: Secondary | ICD-10-CM | POA: Diagnosis not present

## 2022-06-16 DIAGNOSIS — J449 Chronic obstructive pulmonary disease, unspecified: Secondary | ICD-10-CM | POA: Diagnosis not present

## 2022-06-16 DIAGNOSIS — I5021 Acute systolic (congestive) heart failure: Secondary | ICD-10-CM | POA: Diagnosis not present

## 2022-06-16 DIAGNOSIS — J9601 Acute respiratory failure with hypoxia: Secondary | ICD-10-CM | POA: Diagnosis not present

## 2022-06-18 ENCOUNTER — Telehealth: Payer: Self-pay | Admitting: Family Medicine

## 2022-06-18 ENCOUNTER — Other Ambulatory Visit: Payer: Self-pay

## 2022-06-18 MED ORDER — SACUBITRIL-VALSARTAN 24-26 MG PO TABS: 1.0000 | ORAL_TABLET | Freq: Two times a day (BID) | ORAL | 1 refills | Status: DC

## 2022-06-18 MED ORDER — ROSUVASTATIN CALCIUM 10 MG PO TABS: 10.0000 mg | ORAL_TABLET | Freq: Every day | ORAL | 1 refills | Status: DC

## 2022-06-18 MED ORDER — BISOPROLOL FUMARATE 5 MG PO TABS: 5.0000 mg | ORAL_TABLET | Freq: Every day | ORAL | 1 refills | Status: DC

## 2022-06-18 MED ORDER — SPIRONOLACTONE 25 MG PO TABS: 25.0000 mg | ORAL_TABLET | Freq: Every day | ORAL | 0 refills | Status: DC

## 2022-06-18 NOTE — Telephone Encounter (Signed)
Patient grandaughter called asking why provider denied 3 of her meds she has always been on and been out for 3 days and needs them today.  Can nurse return [name] call at [phone].

## 2022-06-18 NOTE — Telephone Encounter (Signed)
Medications sent to [facility]  

## 2022-06-20 DIAGNOSIS — Z7901 Long term (current) use of anticoagulants: Secondary | ICD-10-CM | POA: Diagnosis not present

## 2022-06-20 DIAGNOSIS — Z7951 Long term (current) use of inhaled steroids: Secondary | ICD-10-CM | POA: Diagnosis not present

## 2022-06-20 DIAGNOSIS — J9601 Acute respiratory failure with hypoxia: Secondary | ICD-10-CM | POA: Diagnosis not present

## 2022-06-20 DIAGNOSIS — Z9981 Dependence on supplemental oxygen: Secondary | ICD-10-CM | POA: Diagnosis not present

## 2022-06-21 ENCOUNTER — Other Ambulatory Visit: Payer: Self-pay | Admitting: Family Medicine

## 2022-06-21 ENCOUNTER — Other Ambulatory Visit: Payer: Self-pay

## 2022-06-21 DIAGNOSIS — J4489 Other specified chronic obstructive pulmonary disease: Secondary | ICD-10-CM

## 2022-06-21 DIAGNOSIS — R918 Other nonspecific abnormal finding of lung field: Secondary | ICD-10-CM

## 2022-06-21 MED ORDER — ATROVENT HFA 17 MCG/ACT IN AERS
INHALATION_SPRAY | RESPIRATORY_TRACT | 0 refills | Status: DC
Start: 2022-06-21 — End: 2022-07-23

## 2022-06-21 MED ORDER — FLUTICASONE PROPIONATE 50 MCG/ACT NA SUSP
NASAL | 0 refills | Status: AC
Start: 2022-06-21 — End: ?

## 2022-06-21 MED ORDER — BUDESONIDE-FORMOTEROL FUMARATE 160-4.5 MCG/ACT IN AERO
2.0000 | INHALATION_SPRAY | Freq: Two times a day (BID) | RESPIRATORY_TRACT | 0 refills | Status: DC
Start: 2022-06-21 — End: 2022-09-05

## 2022-06-22 ENCOUNTER — Other Ambulatory Visit: Payer: Self-pay | Admitting: Family Medicine

## 2022-06-22 ENCOUNTER — Telehealth: Payer: Self-pay | Admitting: Family Medicine

## 2022-06-22 NOTE — Telephone Encounter (Signed)
Script was refilled 3 days ago.

## 2022-06-22 NOTE — Telephone Encounter (Signed)
Patient granddaughter called said patient needs a refill on her medicine, spironolactone (ALDACTONE) 25 MG tablet [[id number]]  Pharmacy: [facility]

## 2022-06-26 ENCOUNTER — Telehealth: Payer: Self-pay | Admitting: Family Medicine

## 2022-06-26 ENCOUNTER — Other Ambulatory Visit: Payer: Self-pay

## 2022-06-26 MED ORDER — SPIRONOLACTONE 25 MG PO TABS: 25.0000 mg | ORAL_TABLET | Freq: Every day | ORAL | 0 refills | Status: DC

## 2022-06-26 NOTE — Telephone Encounter (Signed)
Refills sent

## 2022-06-26 NOTE — Telephone Encounter (Signed)
Prescription Request  06/26/2022  LOV: 05/24/2022  What is the name of the medication or equipment? spironolactone (ALDACTONE) 25 MG tablet   Have you contacted your pharmacy to request a refill? Yes   Which pharmacy would you like this sent to?  [facility] - [place], [place] - [address]  [place] [address] Phone: [phone] Fax: [phone]    Patient notified that their request is being sent to the clinical staff for review and that they should receive a response within 2 business days.   Please advise at [facility] [phone]

## 2022-06-27 DIAGNOSIS — Z9981 Dependence on supplemental oxygen: Secondary | ICD-10-CM | POA: Diagnosis not present

## 2022-06-27 DIAGNOSIS — Z7901 Long term (current) use of anticoagulants: Secondary | ICD-10-CM | POA: Diagnosis not present

## 2022-06-27 DIAGNOSIS — Z7951 Long term (current) use of inhaled steroids: Secondary | ICD-10-CM | POA: Diagnosis not present

## 2022-06-27 DIAGNOSIS — J9601 Acute respiratory failure with hypoxia: Secondary | ICD-10-CM | POA: Diagnosis not present

## 2022-07-03 ENCOUNTER — Ambulatory Visit (INDEPENDENT_AMBULATORY_CARE_PROVIDER_SITE_OTHER): Payer: 59 | Admitting: Orthopaedic Surgery

## 2022-07-03 ENCOUNTER — Encounter: Payer: Self-pay | Admitting: Orthopaedic Surgery

## 2022-07-03 VITALS — BP 133/53 | HR 63 | Ht 65.0 in | Wt 160.4 lb

## 2022-07-03 DIAGNOSIS — G8929 Other chronic pain: Secondary | ICD-10-CM | POA: Diagnosis not present

## 2022-07-03 DIAGNOSIS — M25562 Pain in left knee: Secondary | ICD-10-CM | POA: Diagnosis not present

## 2022-07-03 DIAGNOSIS — Z9981 Dependence on supplemental oxygen: Secondary | ICD-10-CM

## 2022-07-03 MED ORDER — TRAMADOL HCL 50 MG PO TABS: ORAL_TABLET | ORAL | 3 refills | Status: DC

## 2022-07-03 NOTE — Progress Notes (Signed)
My leg and knee are the same.  She has chronic pain of the left knee and leg.  She has no new trauma.  She has no redness.  Left knee has slight effusion, crepitus, ROM 0 to 105, stable, no distal edema.  She is in a wheelchair with supplemental oxygen.  Encounter Diagnoses  Name Primary?   Chronic pain of left knee Yes   Supplemental oxygen dependent    I will refill her Tramadol.  I have reviewed the [place] Controlled Substance Reporting System web site prior to prescribing narcotic medicine for this patient.  Return in three months.  Call if any problem.  Precautions discussed.  Electronically Signed Darreld Mclean, MD 6/18/20241:32 PM

## 2022-07-03 NOTE — Progress Notes (Unsigned)
Assessment/Plan:   1.  Tremor  -It sounds like the patient has baseline tremor, which was likely due to her lung medications previously.  However, when she was placed on amiodarone in March, her tremor dramatically increased.  We discussed that amiodarone can induce tremor in up to 1/3 of pts. in addition, it is very difficult to treat tremor in patients who are on tremor inducing medication.  More difficult is the fact that she really cannot take any of the first-line medications used to treat tremor.  She is already on a beta-blocker, which eliminates propranolol.  She cannot take primidone because of the interaction with Eliquis.  This really leaves second line medications, which either do not work well or have high side effect profile.  I think the first thing to do is to contact the cardiologist to see if there is anything that they can do with the amiodarone, including lowering the dose, although many times it takes discontinuation of this medication to control the tremor, especially when the tremor is this significant.  I will try to get in touch with Dr. Diona Browner in this regard.  I told the patient/granddaughter to let us know if they do not hear from [place] by Monday.    Subjective:   [name] was seen in consultation in the movement disorder clinic at the request of Kerri Perches, MD.  Pt with granddaughter who supplements hx.  Pts granddaughter states that it started 2-3 years ago but after a hospitalization in march, the tremor got much worse.  In fact, they stated that it was generally fairly mild prior to her hospitalization in March.  In March, she was hospitalized with new onset ischemic cardiomyopathy and new onset paroxysmal atrial fibrillation.  Amiodarone, Entresto and eliquis were added at that hospitalization.  The patient's granddaughter states that they followed up with the cardiology PA recently and mentioned that tremor was much worse.  I do see that they followed up with  the PA, but do not see any mention in the notes regarding tremor.  The patient's granddaughter was under the impression that the amlodipine was stopped because of the fact that they complained about worsening tremor.  However, medical records indicate that it was stopped because of lowering blood pressure.     Tremor is most noticeable when going to grab objects but much worse with intention.   Pt lives with granddaughter.  Pt has baseline dementia per granddaughter.  Granddaughter moved in with patient after hospitalization to help care for her.  Grandddaughter states that she cuts the food but patient self feeds.  She spills things like soup.  They have to put all of her food on a tray because she "makes a mess."  She drinks her drinks out of a can or something with a lid.    Current/Previously tried tremor medications: bisoprolol  Current medications that may exacerbate tremor:  albuterol, atrovent; symbicort; amiodarone    Allergies  Allergen Reactions   Penicillins Hives, Shortness Of Breath and Swelling    Has patient had a PCN reaction causing immediate rash, facial/tongue/throat swelling, SOB or lightheadedness with hypotension: yes Has patient had a PCN reaction causing severe rash involving mucus membranes or skin necrosis:no Has patient had a PCN reaction that required hospitalization: yes Has patient had a PCN reaction occurring within the last 10 years: no If all of the above answers are "NO", then may proceed with Cephalosporin use. Tolerates rocephin/keflex   Sulfonamide Derivatives Other (See Comments)  Shaking all over, seizure like symptoms. Hospitalization resulted    Avapro [Irbesartan] Other (See Comments)    Pt associates avapro with bruising though aware scientifically not the case, wants to chanmge med   Levaquin [Levofloxacin] Itching    Current Meds  Medication Sig   acetaminophen (TYLENOL) 325 MG tablet Take 650 mg by mouth every 6 (six) hours as needed for  moderate pain.    albuterol (PROVENTIL) (2.5 MG/3ML) 0.083% nebulizer solution INHALE ONE VIAL VIA NEBULIZER THREE TIMES DAILY. (Patient taking differently: Take 2.5 mg by nebulization 3 (three) times daily as needed for wheezing or shortness of breath.)   albuterol (VENTOLIN HFA) 108 (90 Base) MCG/ACT inhaler INHALE (2) PUFFS EVERY FOURHOURS AS NEEDED ONLY IF YOU CANT CATCH YOUR BREATH (Patient taking differently: Inhale 2 puffs into the lungs every 4 (four) hours as needed for wheezing or shortness of breath. INHALE (2) PUFFS EVERY FOURHOURS AS NEEDED ONLY IF YOU CANT CATCH YOUR BREATH)   apixaban (ELIQUIS) 5 MG TABS tablet Take 1 tablet (5 mg total) by mouth 2 (two) times daily.   bisoprolol (ZEBETA) 5 MG tablet Take 1 tablet (5 mg total) by mouth daily.   budesonide-formoterol (SYMBICORT) 160-4.5 MCG/ACT inhaler Inhale 2 puffs into the lungs 2 (two) times daily.   Calcium Carbonate-Vitamin D (CALCIUM 600 + D PO) Take 1 tablet by mouth 2 (two) times daily.   Cyanocobalamin (VITAMIN B 12) 500 MCG TABS Take 500 mcg by mouth daily. Take one tablet by mouth once daily   fluticasone (FLONASE) 50 MCG/ACT nasal spray PLACE 2 SPRAYS IN EACH NOSTRIL DAILY.   ipratropium (ATROVENT HFA) 17 MCG/ACT inhaler INHALE 2 PUFFS BY MOUTH FOUR TIMES A DAY.   Misc. Devices MISC Please provide patient with mastectomy bra and prosthesis. Dx: History of left breast cancer   nicotine (NICODERM CQ - DOSED IN MG/24 HR) 7 mg/24hr patch APPLY 1 PATCH ONTO THE SKIN ONCE DAILY   pantoprazole (PROTONIX) 40 MG tablet TAKE ONE TABLET BY MOUTH ONCE DAILY BEFORE BREAKFAST.   polyethylene glycol powder (GLYCOLAX/MIRALAX) powder MIX 1 CAPFUL IN 8 OUNCES OF JUICE OR WATER AND DRINK ONCE DAILY. (Patient taking differently: Take 17 g by mouth daily.)   rosuvastatin (CRESTOR) 10 MG tablet Take 1 tablet (10 mg total) by mouth daily.   sacubitril-valsartan (ENTRESTO) 24-26 MG Take 1 tablet by mouth 2 (two) times daily.   spironolactone  (ALDACTONE) 25 MG tablet Take 1 tablet (25 mg total) by mouth daily.   traMADol (ULTRAM) 50 MG tablet One tablet every eight hours as needed for pain.      Objective:   VITALS:   Vitals:   07/05/22 0944  BP: 120/84  Pulse: 61  SpO2: 95%  Weight: 152 lb (68.9 kg)  Height: 5\' 5"  (1.651 m)   Gen:  Appears stated age and in NAD. HEENT:  Normocephalic, atraumatic. The mucous membranes are moist. The superficial temporal arteries are without ropiness or tenderness. Cardiovascular: Regular rate and rhythm. Lungs: Clear to auscultation bilaterally.  She is wearing oxygen. Neck: There are no carotid bruits noted bilaterally.  NEUROLOGICAL:  Orientation:  The patient is alert and oriented to person and place.  However her history is not always accurate and she relies on her granddaughter for assistance Cranial nerves: There is good facial symmetry. Extraocular muscles are intact and visual fields are full to confrontational testing. Speech is fluent and clear. Soft palate rises symmetrically and there is no tongue deviation. Hearing is intact to conversational  tone. Tone: Tone is good throughout. Sensation: Sensation is intact to light touch touch throughout (facial, trunk, extremities).  Vibration is intact at the bilateral ankle. Coordination:  The patient has no decremation with rapid alternating movements. Motor: Strength is 5/5 in the bilateral upper and lower extremities.  Shoulder shrug is equal bilaterally.  There is no pronator drift.  There are no fasciculations noted. Gait and Station: The patient pushes off to arise.  She is given her walker.  She walks slow and purposeful.  When she is out in the hall, we did take her walker away and she was not shuffling, but again she walks slow and purposeful.  MOVEMENT EXAM: Tremor: there is jaw tremor.  There is some tongue tremor in the mouth.  There is bilateral UE rest tremor, R>L  There is postural tremor.  She has trouble with  Archimedes spirals bilaterally.  She is given unknown object to hold to her mouth as if she was drinking it, and she has trouble with this, overall mild to moderate.  I have reviewed and interpreted the following labs independently   Chemistry      Component Value Date/Time   NA 134 (L) 04/17/2022 1120   NA 139 11/23/2021 1535   K 5.3 (H) 04/17/2022 1120   CL 100 04/17/2022 1120   CO2 26 04/17/2022 1120   BUN 45 (H) 04/17/2022 1120   BUN 16 11/23/2021 1535   CREATININE 1.10 (H) 04/17/2022 1120   CREATININE 0.89 09/25/2019 0958      Component Value Date/Time   CALCIUM 10.7 (H) 04/17/2022 1120   ALKPHOS 64 04/06/2022 1300   AST 23 04/06/2022 1300   ALT 18 04/06/2022 1300   BILITOT 0.8 04/06/2022 1300   BILITOT 0.2 11/23/2021 1535      Lab Results  Component Value Date   WBC 18.6 (H) 04/17/2022   HGB 15.3 (H) 04/17/2022   HCT 48.4 (H) 04/17/2022   MCV 89.8 04/17/2022   PLT 428 (H) 04/17/2022   Lab Results  Component Value Date   TSH 0.501 04/06/2022      Total time spent on today's visit was 45 minutes, including both face-to-face time and nonface-to-face time.  Time included that spent on review of records (prior notes available to me/labs/imaging if pertinent), discussing treatment and goals, answering patient's questions and coordinating care.  CC:  Kerri Perches, MD

## 2022-07-05 ENCOUNTER — Ambulatory Visit (INDEPENDENT_AMBULATORY_CARE_PROVIDER_SITE_OTHER): Payer: 59 | Admitting: Neurology

## 2022-07-05 ENCOUNTER — Encounter: Payer: Self-pay | Admitting: Neurology

## 2022-07-05 ENCOUNTER — Telehealth: Payer: Self-pay | Admitting: *Deleted

## 2022-07-05 VITALS — BP 120/84 | HR 61 | Ht 65.0 in | Wt 152.0 lb

## 2022-07-05 DIAGNOSIS — G251 Drug-induced tremor: Secondary | ICD-10-CM | POA: Diagnosis not present

## 2022-07-05 MED ORDER — AMIODARONE HCL 200 MG PO TABS: 100.0000 mg | ORAL_TABLET | Freq: Every day | ORAL | 3 refills | Status: DC

## 2022-07-05 NOTE — Telephone Encounter (Signed)
[name] notified of the Amiodarone dose change to 100 mg daily.

## 2022-07-05 NOTE — Patient Instructions (Signed)
It was really good to see you today!  I will send a message to your cardiologist to see if there is anything we can do with your amiodarone.  Call me on Monday if you don't hear from [place].  The physicians and staff at [facility] are committed to providing excellent care. You may receive a survey requesting feedback about your experience at our office. We strive to receive "very good" responses to the survey questions. If you feel that your experience would prevent you from giving the office a "very good " response, please contact our office to try to remedy the situation. We may be reached at [phone]. Thank you for taking the time out of your busy day to complete the survey.

## 2022-07-05 NOTE — Telephone Encounter (Signed)
-----   Message from Jonelle Sidle, MD sent at 07/05/2022  1:25 PM EDT ----- I will forward to our [place] office nurse to get amiodarone cut down to 100 mg daily for now. ----- Message ----- From: Vladimir Faster, DO Sent: 07/05/2022   1:15 PM EDT To: Jonelle Sidle, MD; Antoine Poche, MD  Sorry about that!  I tried to look and see who she saw and I still got it wrong!  Shall I call the patient or do you want me to let cardiology call her?  Appreciate your message!   ----- Message ----- From: Jonelle Sidle, MD Sent: 07/05/2022   1:00 PM EDT To: Octaviano Batty Tat, DO; Antoine Poche, MD  Thanks for reaching out.  It looks like she actually follows with Dr. Wyline Mood, although I did see her in consultation in the hospital.  I would agree, first step is cutting down amiodarone dose to 100 mg daily although it may take stopping it completely. ----- Message ----- From: Vladimir Faster, DO Sent: 07/05/2022  12:25 PM EDT To: Jonelle Sidle, MD  Dr. Diona Browner, good afternoon!  You put this pt on amiodarone for new onset a-fib in the hospital.  Since then, she has had significant tremor (trouble feeding herself).  Her granddaughter has moved in with her to assist.  Its pretty bad.  I told her I would f/u with you as I am not sure that I can do a ton, esp since she is also on eliquis and primidone will interact with that.  Thanks for your assistance!  Lurena Joiner

## 2022-07-13 ENCOUNTER — Ambulatory Visit (HOSPITAL_COMMUNITY)
Admission: RE | Admit: 2022-07-13 | Discharge: 2022-07-13 | Disposition: A | Discharge status: Home / Self Care | Payer: 59 | Source: Ambulatory Visit | Attending: Family Medicine | Admitting: Family Medicine

## 2022-07-13 DIAGNOSIS — J90 Pleural effusion, not elsewhere classified: Secondary | ICD-10-CM | POA: Diagnosis not present

## 2022-07-13 DIAGNOSIS — R9389 Abnormal findings on diagnostic imaging of other specified body structures: Secondary | ICD-10-CM | POA: Diagnosis not present

## 2022-07-15 DIAGNOSIS — E118 Type 2 diabetes mellitus with unspecified complications: Secondary | ICD-10-CM | POA: Diagnosis not present

## 2022-07-17 DIAGNOSIS — I5021 Acute systolic (congestive) heart failure: Secondary | ICD-10-CM | POA: Diagnosis not present

## 2022-07-17 DIAGNOSIS — J441 Chronic obstructive pulmonary disease with (acute) exacerbation: Secondary | ICD-10-CM | POA: Diagnosis not present

## 2022-07-17 DIAGNOSIS — M5410 Radiculopathy, site unspecified: Secondary | ICD-10-CM | POA: Diagnosis not present

## 2022-07-17 DIAGNOSIS — J9601 Acute respiratory failure with hypoxia: Secondary | ICD-10-CM | POA: Diagnosis not present

## 2022-07-18 ENCOUNTER — Other Ambulatory Visit: Payer: Self-pay | Admitting: Family Medicine

## 2022-07-18 ENCOUNTER — Other Ambulatory Visit: Payer: Self-pay | Admitting: Internal Medicine

## 2022-07-20 ENCOUNTER — Telehealth: Payer: Self-pay | Admitting: Family Medicine

## 2022-07-20 ENCOUNTER — Ambulatory Visit (HOSPITAL_COMMUNITY)
Admission: RE | Admit: 2022-07-20 | Discharge: 2022-07-20 | Disposition: A | Discharge status: Home / Self Care | Payer: 59 | Source: Ambulatory Visit | Attending: Student | Admitting: Student

## 2022-07-20 DIAGNOSIS — I5021 Acute systolic (congestive) heart failure: Secondary | ICD-10-CM | POA: Diagnosis not present

## 2022-07-20 DIAGNOSIS — I5022 Chronic systolic (congestive) heart failure: Secondary | ICD-10-CM | POA: Diagnosis not present

## 2022-07-20 DIAGNOSIS — I428 Other cardiomyopathies: Secondary | ICD-10-CM

## 2022-07-20 DIAGNOSIS — J441 Chronic obstructive pulmonary disease with (acute) exacerbation: Secondary | ICD-10-CM | POA: Diagnosis not present

## 2022-07-20 DIAGNOSIS — M5410 Radiculopathy, site unspecified: Secondary | ICD-10-CM | POA: Diagnosis not present

## 2022-07-20 DIAGNOSIS — J9601 Acute respiratory failure with hypoxia: Secondary | ICD-10-CM | POA: Diagnosis not present

## 2022-07-20 LAB — ECHOCARDIOGRAM LIMITED
Area-P 1/2: 3.85 cm2
Calc EF: 55.6 %
S' Lateral: 3.05 cm
Single Plane A2C EF: 56.1 %
Single Plane A4C EF: 52.4 %

## 2022-07-20 NOTE — Progress Notes (Signed)
  Echocardiogram 2D Echocardiogram has been performed.  [name] 07/20/2022, 1:48 PM

## 2022-07-20 NOTE — Telephone Encounter (Signed)
Called in complain of L eye vision loss   Current eye doctor  has stopped coming to [place].  Would like a new eye referral placed,

## 2022-07-23 ENCOUNTER — Other Ambulatory Visit: Payer: Self-pay | Admitting: Family Medicine

## 2022-07-23 ENCOUNTER — Other Ambulatory Visit: Payer: Self-pay

## 2022-07-23 DIAGNOSIS — J449 Chronic obstructive pulmonary disease, unspecified: Secondary | ICD-10-CM | POA: Diagnosis not present

## 2022-07-23 DIAGNOSIS — J4489 Other specified chronic obstructive pulmonary disease: Secondary | ICD-10-CM

## 2022-07-23 DIAGNOSIS — H547 Unspecified visual loss: Secondary | ICD-10-CM

## 2022-07-23 DIAGNOSIS — J9610 Chronic respiratory failure, unspecified whether with hypoxia or hypercapnia: Secondary | ICD-10-CM | POA: Diagnosis not present

## 2022-07-23 DIAGNOSIS — I5022 Chronic systolic (congestive) heart failure: Secondary | ICD-10-CM | POA: Diagnosis not present

## 2022-07-23 DIAGNOSIS — R918 Other nonspecific abnormal finding of lung field: Secondary | ICD-10-CM

## 2022-07-23 DIAGNOSIS — Z515 Encounter for palliative care: Secondary | ICD-10-CM | POA: Diagnosis not present

## 2022-07-23 NOTE — Telephone Encounter (Signed)
Referral placed.

## 2022-07-25 ENCOUNTER — Ambulatory Visit (INDEPENDENT_AMBULATORY_CARE_PROVIDER_SITE_OTHER): Payer: 59 | Admitting: Internal Medicine

## 2022-07-25 ENCOUNTER — Telehealth: Payer: Self-pay | Admitting: Internal Medicine

## 2022-07-25 ENCOUNTER — Encounter: Payer: Self-pay | Admitting: Internal Medicine

## 2022-07-25 ENCOUNTER — Encounter: Payer: Self-pay | Admitting: Family Medicine

## 2022-07-25 ENCOUNTER — Ambulatory Visit (INDEPENDENT_AMBULATORY_CARE_PROVIDER_SITE_OTHER): Payer: 59 | Admitting: Family Medicine

## 2022-07-25 VITALS — BP 128/82 | HR 62 | Ht 65.0 in | Wt 160.0 lb

## 2022-07-25 VITALS — BP 139/86 | HR 60 | Ht 65.0 in | Wt 159.0 lb

## 2022-07-25 DIAGNOSIS — I5020 Unspecified systolic (congestive) heart failure: Secondary | ICD-10-CM

## 2022-07-25 DIAGNOSIS — I10 Essential (primary) hypertension: Secondary | ICD-10-CM | POA: Diagnosis not present

## 2022-07-25 DIAGNOSIS — I4891 Unspecified atrial fibrillation: Secondary | ICD-10-CM | POA: Diagnosis not present

## 2022-07-25 DIAGNOSIS — J4489 Other specified chronic obstructive pulmonary disease: Secondary | ICD-10-CM

## 2022-07-25 DIAGNOSIS — J9611 Chronic respiratory failure with hypoxia: Secondary | ICD-10-CM

## 2022-07-25 DIAGNOSIS — F17210 Nicotine dependence, cigarettes, uncomplicated: Secondary | ICD-10-CM | POA: Diagnosis not present

## 2022-07-25 DIAGNOSIS — R7303 Prediabetes: Secondary | ICD-10-CM | POA: Diagnosis not present

## 2022-07-25 DIAGNOSIS — E559 Vitamin D deficiency, unspecified: Secondary | ICD-10-CM | POA: Diagnosis not present

## 2022-07-25 DIAGNOSIS — E785 Hyperlipidemia, unspecified: Secondary | ICD-10-CM | POA: Diagnosis not present

## 2022-07-25 DIAGNOSIS — R251 Tremor, unspecified: Secondary | ICD-10-CM

## 2022-07-25 DIAGNOSIS — J9601 Acute respiratory failure with hypoxia: Secondary | ICD-10-CM

## 2022-07-25 MED ORDER — APIXABAN 5 MG PO TABS: 5.0000 mg | ORAL_TABLET | Freq: Two times a day (BID) | ORAL | 5 refills | Status: DC

## 2022-07-25 NOTE — Assessment & Plan Note (Addendum)
Active smoker  - PFT's  03/03/2012  FEV1 1.69 (69 % ) ratio 0.72  p 5 % improvement from saba p ? prior to study with DLCO  17.42 (68%) corrects to 4.53 (92%)  for alv volume and FV curve min curvature/ ERV 37%   - try off acei 11/19/2019 and return for medication reconciliation in 6 weeks - 12/30/2020  After extensive coaching inhaler device,  effectiveness =    75% from a baseline of 50%  - 07/19/2021  After extensive coaching inhaler device,  effectiveness =    75% (short Ti)  - PFT's  09/26/21  FEV1 0.97 (44 % ) ratio 0.54  p 5 % improvement from saba p ? prior to study with DLCO  12.37 (62%)   and FV curve not typical for copd/ air trapping by lung volumes    Improved exam today c/w AB > copd rx symbicort 160 2bid and atroven hfa qid ok for now, no changes needed   If worsens she may be candidate for performist/bud per part B medicare as may start having trouble with actuation of HFA effectively (granddaughter = care taker) advised.   For cough : For cough/ congestion > mucinex or mucinex dm  up to maximum of  1200 mg every 12 hours

## 2022-07-25 NOTE — Progress Notes (Signed)
[name]     MRN: [id number]      DOB: 03-26-1945  Chief Complaint  Patient presents with   Follow-up    Follow up   C/o acute blurry vision of left eye started  x 1 week, no pain HPI [name] is here for follow up and re-evaluation of chronic medical conditions, medication management and review of any available recent lab and radiology data.  Preventive health is updated, specifically  Cancer screening and Immunization.   . The PT denies any adverse reactions to current medications since the last visit.  Will be getting oxygen through pulmonary, smaller tank and she is happy about that  ROS Denies recent fever or chills. Denies sinus pressure, nasal congestion, ear pain or sore throat. Improving chest congestion, productive cough and  wheezing.Remains nicotine free Denies chest pains, palpitations and leg swelling Denies abdominal pain, nausea, vomiting,diarrhea or constipation.   Denies dysuria, frequency, hesitancy or incontinence. Chronic joint pain, swelling and limitation in mobility. Denies headaches, seizures, numbness, or tingling. Denies depression, anxiety or insomnia. Denies skin break down or rash.   PE  BP 139/86 (BP Location: Right Arm, Patient Position: Sitting, Cuff Size: Large)   Pulse 60   Ht 5\' 5"  (1.651 m)   Wt 159 lb (72.1 kg)   SpO2 97%   BMI 26.46 kg/m   Patient alert and oriented and in no cardiopulmonary distress.  HEENT: No facial asymmetry, EOMI,     Neck decreased ROM Chest: Clear to auscultation bilaterally.  CVS: S1, S2 no murmurs, no S3.Regular rate.  ABD: Soft non tender.   Ext: No edema  MS: decreased  ROM spine, shoulders, hips and knees.  Skin: Intact, no ulcerations or rash noted.  Psych: Good eye contact, normal affect. Memory intact not anxious or depressed appearing.  CNS: CN 2-12 intact, power,  normal throughout.no focal deficits noted.   Assessment & Plan  HFrEF (heart failure with reduced ejection  fraction) (HCC) Stable and controlled, no s/s of decompensation  HTN (hypertension) Controlled, no change in medication   Hyperlipidemia LDL goal <100 Hyperlipidemia:Low fat diet discussed and encouraged.   Lipid Panel  Lab Results  Component Value Date   CHOL 86 04/10/2022   HDL 47 04/10/2022   LDLCALC 36 04/10/2022   TRIG 15 04/10/2022   CHOLHDL 1.8 04/10/2022     Updated lab needed at/ before next visit.   Tremor Still awaiting Neurology ecal  Asthmatic bronchitis , chronic Managed by pulmonary, improved symptoms as remains nicotine fre, awaiting smaller portable oxygen tank. Med management per pulmonary  Cigarette smoker Remains nicotine free  Prediabetes Patient educated about the importance of limiting  Carbohydrate intake , the need to commit to daily physical activity for a minimum of 30 minutes , and to commit weight loss. The fact that changes in all these areas will reduce or eliminate all together the development of diabetes is stressed.      Latest Ref Rng & Units 04/17/2022   11:20 AM 04/17/2022    4:44 AM 04/16/2022    4:35 AM 04/15/2022    4:05 AM 04/14/2022    5:12 AM  Diabetic Labs  Creatinine 0.44 - 1.00 mg/dL 6.21  3.08  6.57  8.46  0.94       07/25/2022   11:32 AM 07/25/2022   10:58 AM 07/05/2022    9:44 AM 07/03/2022    1:21 PM 05/24/2022   11:16 AM 05/17/2022    3:15 PM 04/17/2022  9:33 AM  BP/Weight  Systolic BP 139 128 120 133 127 126 126  Diastolic BP 86 82 84 53 65 66 66  Wt. (Lbs) 159 160 152 160.38 154.12 151   BMI 26.46 kg/m2 26.63 kg/m2 25.29 kg/m2 26.69 kg/m2 25.65 kg/m2 25.13 kg/m2       Latest Ref Rng & Units 09/07/2014   12:00 AM 06/10/2013    2:00 PM  Foot/eye exam completion dates  Eye Exam No Retinopathy No Retinopathy       Foot Form Completion   Done     This result is from an external source.    Updated lab needed at/ before next visit.   Atrial fibrillation (HCC) Maintained on eliquis

## 2022-07-25 NOTE — Telephone Encounter (Signed)
I got message from [place] with [employer] the 02 order needs to be on 02 template.  Order should be updated to the below verbiage.  "If new O2 start,, Please evaluate and titrate for best fit POC or portable O2 system. RT to evaluate and titrate patient for POC or Homefill with OCD maintain sats >/=90%. if patient qualifies, dispense POC or homefill with OCD 1-5 pulse dose."   please have nurse confirm if pt is hospice. I am not seeing anything in recent notes and she has O2 through [place] with her insurance not hospice.

## 2022-07-25 NOTE — Patient Instructions (Addendum)
My office will be contacting you by phone for referral to [employer] for best fit for ambulatory 02 ? POC eligible  - if you don't hear back from my office within one week please call us back or notify us thru MyChart and we'll address it right away.  Best cough medication =  mucinex or mucinex dm up to 1200 mg every 12 hours as needed    Please schedule a follow up visit in 3 months but call sooner if needed - bring inhalers

## 2022-07-25 NOTE — Assessment & Plan Note (Addendum)
Referred to [employer] for best fir for portable 02  Advised: Make sure you check your oxygen saturation  AT  your highest level of activity (not after you stop)   to be sure it stays over 90% and adjust  02 flow upward to maintain this level if needed but remember to turn it back to previous settings when you stop (to conserve your supply).        F/u 3 m sooner prn  Each maintenance medication was reviewed in detail including emphasizing most importantly the difference between maintenance and prns and under what circumstances the prns are to be triggered using an action plan format where appropriate.  Total time for H and P, chart review, counseling, reviewing hfa/neb/02  device(s) and generating customized AVS unique to this office visit / same day charting = 30 min

## 2022-07-25 NOTE — Progress Notes (Signed)
[name], female    DOB: 12/13/1945    MRN: [id number]   Brief patient profile:  13 yowf quit smoking 04/09/22  with new onset doe around 2014 dx as copd (though PFTs 03/03/12 did not confirm) and referred to pulmonary clinic in [name]  11/19/2019 by Dr    Lodema Hong for refractory cough and sob  History of Present Illness  11/19/2019  Pulmonary/ 1st office eval/ [name] / [place] [facility] maint on symbicort 160/atrovent Chief Complaint  Patient presents with   Consult    sinus congestion for about a 1-2 weeks  Dyspnea:  Goal mb and back 100 ft /  Variably sob shopping / not using HC parking  Cough: sporadic  Sleep: fine / some worse cough in am SABA use: not clear  rec Benazapril 40 mg  > stop this and replace with ibesartan 300 mg one daily in its place  Plan A = Automatic = Always=    symbicort 160 Take 2 puffs first thing in am and then another 2 puffs about 12 hours later.  Work on inhaler technique:   Plan B = Backup (to supplement plan A, not to replace it) Only use your albuterol inhaler as a rescue medication  Plan C = Crisis (instead of Plan B but only if Plan B stops working) - only use your albuterol nebulizer if you first try Plan B and it fails to help  The key is to stop smoking completely before smoking completely stops you! Please schedule a follow up office visit in 6 weeks, call sooner if needed with all medications     12/29/2019  f/u ov/[name] office/[name] re: doe/ brown mucus x one week already took medrol dose pack p televisit with pcp  Chief Complaint  Patient presents with   Follow-up    Still bothered by sinus congestion. She is c/o cough with brown sputum over the past wk. She is using the albuterol inhaler about 3 x per wk.   Asthmatic bronchitis, chronic (HCC) Active smoker  - PFT's  03/03/2012  FEV1 1.69 (69 % ) ratio 0.72  p 5 % improvement from saba p ? prior to study with DLCO  17.42 (68%) corrects to 4.53 (92%)  for alv volume and FV  curve min curvature/ ERV 37%   - try off acei 11/19/2019 and return for medication reconciliation in 6 weeks  Hypertension goal BP (blood pressure) < 130/80  Dyspnea:  Better though very inactive Cough:  X one week worse as above   SABA use: very poor insight into ABC plan, could not articulate any part of it even with prompting  02: none  rec Continue Irbesartan 300 mg one daily plus add  hydrodiuril (hydrochlorothiazide) 25 mg one daily  zpak should changes mucus brown to clear  The key is to stop smoking completely before smoking completely stops you! For cough > mucinex 1200 mg every 12 hours as needed (over the counter) Plan A = Automatic = Always=    symbicort 160 Take 2 puffs first thing in am and then another 2 puffs about 12 hours later atrovent is 2 puffs four times daily  Work on inhaler technique:  Plan B = Backup (to supplement plan A, not to replace it) Only use your albuterol inhaler as a rescue medication to be used if you can't catch your breath  Plan C = Crisis (instead of Plan B but only if Plan B stops working) - only use your albuterol nebulizer if  you first try Plan B and it fails to help > ok to use the nebulizer up to every 4 hours but if start needing it regularly call for immediate appointment Please schedule a follow up office visit in 6 weeks with pfts on return      07/19/2021  f/u ov/[name] office/[name] re: AB maint on symbicort 160 2bid   Chief Complaint  Patient presents with   Follow-up    Doing well. No new concerns    Dyspnea:  MB and back easier / no real steps  Cough: still smoker's cough worse in ams Sleeping: does fine bed is flat with pillows  SABA use: none now  02: none Rec Plan A = Automatic = Always=    Symbicort 160 Take 2 puffs first thing in am and then another 2 puffs about 12 hours later.  Work on inhaler technique:  Plan B = Backup (to supplement plan A, not to replace it) Only use your albuterol inhaler as a rescue medication   Plan C = Crisis (instead of Plan B but only if Plan B stops working) - only use your albuterol nebulizer if you first try Plan B  PFTs prior to next office visit  The key is to stop smoking completely before smoking completely stops you! Please schedule a follow up visit in 6 months but call sooner if needed  with all medications /inhalers/ solutions in hand    01/18/2022  f/u ov/[name] office/[name] re: GOLD 3/ AB  maint on symb 160 Chief Complaint  Patient presents with   Follow-up    Breathing doing ok   Dyspnea:  no longer doing mb due to L leg pain  Cough: better but still smoker's rattle worse in am/ slt creamy  Sleeping: flat bed with pillows  SABA use: very confused with instructions 02: none  Covid status: up to date  Lung cancer screening: in program  Rec Plan A = Automatic = Always=    Symbicort 160 Take 2 puffs first thing in am and then another 2 puffs about 12 hours later.  Work on inhaler technique:   Please schedule a follow up visit in 3 months but call sooner if needed      07/25/2022 post hosp  f/u ov/ office/[name] re: COPD 3/ AB/CHF  maint on symbicort / atrovent Chief Complaint  Patient presents with   Follow-up    Breathing is overall doing well.  Dyspnea:  w/c bound  for ov's/ able to walk at home s walker and "[place] a [place] trip to [place]" but mostly house bound   Cough: no Sleeping: hosp bed x 30 degrees  SABA use: none 02: 2lpm 24/7      No obvious day to day or daytime variability or assoc excess/ purulent sputum or mucus plugs or hemoptysis or cp or chest tightness, subjective wheeze or overt sinus or hb symptoms.   Sleeping  without nocturnal  or early am exacerbation  of respiratory  c/o's or need for noct saba. Also denies any obvious fluctuation of symptoms with weather or environmental changes or other aggravating or alleviating factors except as outlined above   No unusual exposure hx or h/o childhood pna/ asthma or knowledge of  premature birth.  Current Allergies, Complete Past Medical History, Past Surgical History, Family History, and Social History were reviewed in [name] record.  ROS  The following are not active complaints unless bolded Hoarseness, sore throat, dysphagia, dental problems, itching, sneezing,  nasal  congestion or discharge of excess mucus or purulent secretions, ear ache,   fever, chills, sweats, unintended wt loss or wt gain, classically pleuritic or exertional cp,  orthopnea pnd or arm/hand swelling  or leg swelling, presyncope, palpitations, abdominal pain, anorexia, nausea, vomiting, diarrhea  or change in bowel habits or change in bladder habits, change in stools or change in urine, dysuria, hematuria,  rash, arthralgias, visual complaints, headache, numbness, weakness or ataxia or problems with walking improving  or coordination,  change in mood or  memory. tremor        Current Meds  Medication Sig   acetaminophen (TYLENOL) 325 MG tablet Take 650 mg by mouth every 6 (six) hours as needed for moderate pain.    albuterol (PROVENTIL) (2.5 MG/3ML) 0.083% nebulizer solution INHALE ONE VIAL VIA NEBULIZER THREE TIMES DAILY. (Patient taking differently: Take 2.5 mg by nebulization 3 (three) times daily as needed for wheezing or shortness of breath.)   albuterol (VENTOLIN HFA) 108 (90 Base) MCG/ACT inhaler INHALE (2) PUFFS EVERY FOURHOURS AS NEEDED ONLY IF YOU CANT CATCH YOUR BREATH (Patient taking differently: Inhale 2 puffs into the lungs every 4 (four) hours as needed for wheezing or shortness of breath. INHALE (2) PUFFS EVERY FOURHOURS AS NEEDED ONLY IF YOU CANT CATCH YOUR BREATH)   amiodarone (PACERONE) 200 MG tablet Take 0.5 tablets (100 mg total) by mouth daily.   apixaban (ELIQUIS) 5 MG TABS tablet Take 1 tablet (5 mg total) by mouth 2 (two) times daily.   ATROVENT HFA 17 MCG/ACT inhaler INHALE 2 PUFFS BY MOUTH FOUR TIMES A DAY.   bisoprolol (ZEBETA) 5 MG tablet Take 1  tablet (5 mg total) by mouth daily.   budesonide-formoterol (SYMBICORT) 160-4.5 MCG/ACT inhaler Inhale 2 puffs into the lungs 2 (two) times daily.   Calcium Carbonate-Vitamin D (CALCIUM 600 + D PO) Take 1 tablet by mouth 2 (two) times daily.   Cyanocobalamin (VITAMIN B 12) 500 MCG TABS Take 500 mcg by mouth daily. Take one tablet by mouth once daily   cyclobenzaprine (FLEXERIL) 5 MG tablet Take 1 tablet (5 mg total) by mouth at bedtime.   FARXIGA 10 MG TABS tablet TAKE 1 TABLET BY MOUTH DAILY   fluticasone (FLONASE) 50 MCG/ACT nasal spray PLACE 2 SPRAYS IN EACH NOSTRIL DAILY.   Misc. Devices MISC Please provide patient with mastectomy bra and prosthesis. Dx: History of left breast cancer   Naphazoline HCl (CLEAR EYES OP) Place 1 drop into both eyes daily as needed (irritation).   nicotine (NICODERM CQ - DOSED IN MG/24 HR) 7 mg/24hr patch APPLY 1 PATCH ONTO THE SKIN ONCE DAILY   pantoprazole (PROTONIX) 40 MG tablet TAKE ONE TABLET BY MOUTH ONCE DAILY BEFORE BREAKFAST.   polyethylene glycol powder (GLYCOLAX/MIRALAX) powder MIX 1 CAPFUL IN 8 OUNCES OF JUICE OR WATER AND DRINK ONCE DAILY. (Patient taking differently: Take 17 g by mouth daily.)   rosuvastatin (CRESTOR) 10 MG tablet Take 1 tablet (10 mg total) by mouth daily.   sacubitril-valsartan (ENTRESTO) 24-26 MG Take 1 tablet by mouth 2 (two) times daily.   spironolactone (ALDACTONE) 25 MG tablet Take 1 tablet (25 mg total) by mouth daily.   traMADol (ULTRAM) 50 MG tablet One tablet every eight hours as needed for pain.                   Past Medical History:  Diagnosis Date   Breast cancer (HCC) 2007   Stage I (T1b N0 M0), grade 1 well-differentiated carcinoma  of the left breast status, post lumpectomy followed by radiation therapy. Her estrogen receptor receptors were 93%, progesterone receptors 67%. HER-2/neu was negative. No lymphovascular space invasion was seen. All margins were clear. Ki-67 marker was low at 1% with surgery on  11/15/2004. Treated then with post-lumpectomy radiation, finish   Breast cancer, left breast (HCC) 2007   COPD (chronic obstructive pulmonary disease) (HCC)    Diabetes mellitus without complication (HCC)    Diverticula of colon    Hx of rickettsial disease    Hyperlipidemia    Hypertension    Kidney stones    Nicotine dependence    Osteoarthritis        Objective:    Wts  07/25/2022        160  07/19/2021          169  12/30/2020     170 11/16/2020       174     04/04/2020       174  12/29/19 179 lb (81.2 kg)  12/04/19 177 lb (80.3 kg)  11/19/19 177 lb 3.2 oz (80.4 kg)    Vital signs reviewed  07/25/2022  - Note at rest 02 sats  98% on 2lpm    General appearance:    w/c bound/ smoker's rattle     HEENT : Oropharynx  clear     NECK :  without  apparent JVD/ palpable Nodes/TM    LUNGS: no acc muscle use,  Mild barrel  contour chest wall with bilateral  Distant bs s audible wheeze and  without cough on insp or exp maneuvers  and mild  Hyperresonant  to  percussion bilaterally     CV:  RRR  no s3 or murmur or increase in P2, and no edema   ABD:  soft and nontender with pos end  insp Hoover's  in the supine position.  No bruits or organomegaly appreciated   MS:  Nl gait/ ext warm without deformities Or obvious joint restrictions  calf tenderness, cyanosis or clubbing     SKIN: warm and dry without lesions    NEURO:  alert, approp, nl sensorium with  no motor or cerebellar deficits apparent.         I personally reviewed images and agree with radiology impression as follows:   Chest CT w/o contrast 07/13/22      1. Interval resolution of the previously demonstrated bilateral pleural effusions. 2. No pleural air or abscess seen. 3. Linear, somewhat wedge-shaped area of dense atelectasis or scarring in the posterior right lower lobe. 4. Single mildly enlarged precarinal lymph node, likely reactive. 5. 2 mm nonobstructing mid left renal calculus. 6.  Calcific coronary  artery and aortic atherosclerosis.   Assessment

## 2022-07-25 NOTE — Patient Instructions (Addendum)
Annual exam with MD in November when due  Need to get eye exam asap call Dr Charise Killian for appt, referral sent, send message re appt info to nurse this pm please, pls give grand daughter the number for Dr Jiles Harold  on no smoking, keep it up  Thankful heart has improved   Fasting labs 3 to 7 days before November appt, lipid, cmp and EGFr, TSH and vit D  Thanks for choosing [facility], we consider it a privelige to serve you.

## 2022-07-26 DIAGNOSIS — J449 Chronic obstructive pulmonary disease, unspecified: Secondary | ICD-10-CM | POA: Diagnosis not present

## 2022-07-26 DIAGNOSIS — I5021 Acute systolic (congestive) heart failure: Secondary | ICD-10-CM | POA: Diagnosis not present

## 2022-07-30 ENCOUNTER — Encounter: Payer: Self-pay | Admitting: Family Medicine

## 2022-07-30 ENCOUNTER — Other Ambulatory Visit: Payer: Self-pay

## 2022-07-30 DIAGNOSIS — I5020 Unspecified systolic (congestive) heart failure: Secondary | ICD-10-CM | POA: Insufficient documentation

## 2022-07-30 DIAGNOSIS — R7303 Prediabetes: Secondary | ICD-10-CM

## 2022-07-30 DIAGNOSIS — J45909 Unspecified asthma, uncomplicated: Secondary | ICD-10-CM | POA: Insufficient documentation

## 2022-07-30 DIAGNOSIS — I4891 Unspecified atrial fibrillation: Secondary | ICD-10-CM | POA: Insufficient documentation

## 2022-07-30 NOTE — Assessment & Plan Note (Signed)
Stable and controlled, no s/s of decompensation 

## 2022-07-30 NOTE — Assessment & Plan Note (Signed)
Maintained on eliquis 

## 2022-07-30 NOTE — Assessment & Plan Note (Signed)
Controlled, no change in medication  

## 2022-07-30 NOTE — Assessment & Plan Note (Signed)
Managed by pulmonary, improved symptoms as remains nicotine fre, awaiting smaller portable oxygen tank. Med management per pulmonary

## 2022-07-30 NOTE — Assessment & Plan Note (Signed)
Still awaiting Neurology ecal

## 2022-07-30 NOTE — Assessment & Plan Note (Signed)
Remains nicotine free ?

## 2022-07-30 NOTE — Assessment & Plan Note (Signed)
Hyperlipidemia:Low fat diet discussed and encouraged.   Lipid Panel  Lab Results  Component Value Date   CHOL 86 04/10/2022   HDL 47 04/10/2022   LDLCALC 36 04/10/2022   TRIG 15 04/10/2022   CHOLHDL 1.8 04/10/2022     Updated lab needed at/ before next visit.

## 2022-07-30 NOTE — Assessment & Plan Note (Signed)
Patient educated about the importance of limiting  Carbohydrate intake , the need to commit to daily physical activity for a minimum of 30 minutes , and to commit weight loss. The fact that changes in all these areas will reduce or eliminate all together the development of diabetes is stressed.      Latest Ref Rng & Units 04/17/2022   11:20 AM 04/17/2022    4:44 AM 04/16/2022    4:35 AM 04/15/2022    4:05 AM 04/14/2022    5:12 AM  Diabetic Labs  Creatinine 0.44 - 1.00 mg/dL 2.13  0.86  5.78  4.69  0.94       07/25/2022   11:32 AM 07/25/2022   10:58 AM 07/05/2022    9:44 AM 07/03/2022    1:21 PM 05/24/2022   11:16 AM 05/17/2022    3:15 PM 04/17/2022    9:33 AM  BP/Weight  Systolic BP 139 128 120 133 127 126 126  Diastolic BP 86 82 84 53 65 66 66  Wt. (Lbs) 159 160 152 160.38 154.12 151   BMI 26.46 kg/m2 26.63 kg/m2 25.29 kg/m2 26.69 kg/m2 25.65 kg/m2 25.13 kg/m2       Latest Ref Rng & Units 09/07/2014   12:00 AM 06/10/2013    2:00 PM  Foot/eye exam completion dates  Eye Exam No Retinopathy No Retinopathy       Foot Form Completion   Done     This result is from an external source.    Updated lab needed at/ before next visit.

## 2022-08-03 DIAGNOSIS — H43813 Vitreous degeneration, bilateral: Secondary | ICD-10-CM | POA: Diagnosis not present

## 2022-08-03 DIAGNOSIS — D3131 Benign neoplasm of right choroid: Secondary | ICD-10-CM | POA: Diagnosis not present

## 2022-08-03 DIAGNOSIS — H47012 Ischemic optic neuropathy, left eye: Secondary | ICD-10-CM | POA: Diagnosis not present

## 2022-08-07 ENCOUNTER — Other Ambulatory Visit: Payer: Self-pay | Admitting: Family Medicine

## 2022-08-08 ENCOUNTER — Encounter: Payer: Self-pay | Admitting: Student

## 2022-08-08 ENCOUNTER — Ambulatory Visit: Payer: 59 | Attending: Student | Admitting: Student

## 2022-08-08 VITALS — BP 136/62 | HR 60 | Ht 65.0 in | Wt 167.0 lb

## 2022-08-08 DIAGNOSIS — I10 Essential (primary) hypertension: Secondary | ICD-10-CM

## 2022-08-08 DIAGNOSIS — I4819 Other persistent atrial fibrillation: Secondary | ICD-10-CM | POA: Diagnosis not present

## 2022-08-08 DIAGNOSIS — I2510 Atherosclerotic heart disease of native coronary artery without angina pectoris: Secondary | ICD-10-CM | POA: Diagnosis not present

## 2022-08-08 DIAGNOSIS — E785 Hyperlipidemia, unspecified: Secondary | ICD-10-CM

## 2022-08-08 DIAGNOSIS — I5032 Chronic diastolic (congestive) heart failure: Secondary | ICD-10-CM

## 2022-08-08 MED ORDER — HYDROXYZINE HCL 25 MG PO TABS: 25.0000 mg | ORAL_TABLET | Freq: Two times a day (BID) | ORAL | Status: DC | PRN

## 2022-08-08 NOTE — Patient Instructions (Signed)
Medication Instructions:   Continue current regimen. If you develop a worsening tremor, please make [place] aware as we may need to hold Amiodarone.   *If you need a refill on your cardiac medications before your next appointment, please call your pharmacy*  Follow-Up: At [facility], you and your health needs are our priority.  As part of our continuing mission to provide you with exceptional heart care, we have created designated Provider Care Teams.  These Care Teams include your primary Cardiologist (physician) and Advanced Practice Providers (APPs -  Physician Assistants and Nurse Practitioners) who all work together to provide you with the care you need, when you need it.  We recommend signing up for the patient portal called "MyChart".  Sign up information is provided on this After Visit Summary.  MyChart is used to connect with patients for Virtual Visits (Telemedicine).  Patients are able to view lab/test results, encounter notes, upcoming appointments, etc.  Non-urgent messages can be sent to your provider as well.   To learn more about what you can do with MyChart, go to [email or web address].    Your next appointment:   5-6 months  Provider:   You may see Dina Rich, MD or one of the following Advanced Practice Providers on your designated Care Team:   [place], PA-C  Jacolyn Reedy, [place]

## 2022-08-08 NOTE — Progress Notes (Signed)
Cardiology Office Note    Date:  08/08/2022  ID:  GLENDON [name], DOB 1945-05-02, MRN [id number] Cardiologist: Dina Rich, MD    History of Present Illness:    [name] is a 77 y.o. female with past medical history of coronary calcification by CT (prior NST in 2019 showing mild apical ischemia but low-risk study), HFrEF (EF 35-40% by echo in 03/2022), aortic atherosclerosis, history of breast cancer, COPD, HTN, HLD and Type II DM who presents to the office today for 52-month follow-up.  She was examined by myself in 05/2022 following her recent hospitalization for an acute COPD exacerbation and acute CHF exacerbation during which her echocardiogram showed a newly reduced EF of 35 to 40%. Medical management was recommended given her age and no recent anginal symptoms. Was also found to be in atrial fibrillation and started on Eliquis and Amiodarone. At the time of her follow-up visit, she reported overall feeling well and her shortness of breath had significantly improved. Options for ischemic evaluation were reviewed but she declined and preferred conservative management at that time with plans for a follow-up echocardiogram in 2 months for reassessment of her EF. She was continued on Bisoprolol 5 mg daily, Farxiga 10 mg daily, Entresto 24-26 mg twice daily and Spironolactone 25 mg daily. Amlodipine was discontinued given her episodes of hypotension at home.  In the interim, she did follow-up with Neurology in 06/2022 and reported a worsening essential tremor. This was reviewed with Dr. Diona Browner and Amiodarone was reduced to 100 mg daily. She also had a repeat limited echocardiogram in the interim which showed her EF had normalized to 55 to 60%. The mid inferior septal segment and apical segments were hypokinetic.   In talking with the patient and her granddaughter today, she reports overall feeling well since her last office visit. She denies any recent chest pain or palpitations. No  specific orthopnea, PND or pitting edema. She does remain on 2 L [place] at baseline.  Reports that her tremor significantly improved with dose reduction of Amiodarone (had a tremor prior to initiation of this and symptoms are back to baseline).  She remains on Eliquis for anticoagulation with no reports of melena, hematochezia or hematuria.  Studies Reviewed:   EKG: EKG is not ordered today.  Echocardiogram: 03/2022 IMPRESSIONS     1. Left ventricular ejection fraction, by estimation, is 35 to 40%. The  left ventricle has moderately decreased function. The left ventricle  demonstrates regional wall motion abnormalities (see scoring  diagram/findings for description). There is mild  concentric left ventricular hypertrophy. Left ventricular diastolic  parameters are indeterminate.   2. Right ventricular systolic function is normal. The right ventricular  size is normal. Tricuspid regurgitation signal is inadequate for assessing  PA pressure.   3. The mitral valve is grossly normal. Trivial mitral valve  regurgitation.   4. The aortic valve is tricuspid. There is mild calcification of the  aortic valve. Aortic valve regurgitation is not visualized. Aortic valve  sclerosis is present, with no evidence of aortic valve stenosis.   5. The inferior vena cava is normal in size with greater than 50%  respiratory variability, suggesting right atrial pressure of 3 mmHg.   Comparison(s): No prior Echocardiogram.   Limited Echocardiogram: 07/2022 IMPRESSIONS     1. Limited study.   2. Left ventricular ejection fraction, by estimation, is 55 to 60%. The  left ventricle has normal function. The left ventricle demonstrates  regional wall motion abnormalities (see scoring  diagram/findings for  description).   3. Right ventricular systolic function is normal. The right ventricular  size is normal.   4. The mitral valve is grossly normal. No evidence of mitral valve  regurgitation.   5. The aortic  valve is tricuspid. There is mild calcification of the  aortic valve. Aortic valve regurgitation is not visualized. Aortic valve  sclerosis is present, with no evidence of aortic valve stenosis.   6. The inferior vena cava is normal in size with greater than 50%  respiratory variability, suggesting right atrial pressure of 3 mmHg.   Comparison(s): Prior images reviewed side by side. LVEF has improved, now  55-60% range.   Physical Exam:   VS:  BP 136/62   Pulse 60   Ht 5\' 5"  (1.651 m)   Wt 167 lb (75.8 kg)   SpO2 98% Comment: 2L via [place]  BMI 27.79 kg/m    Wt Readings from Last 3 Encounters:  08/08/22 167 lb (75.8 kg)  07/25/22 159 lb (72.1 kg)  07/25/22 160 lb (72.6 kg)     GEN: Pleasant, elderly female appearing in no acute distress NECK: No JVD; No carotid bruits CARDIAC: RRR, no murmurs, rubs, gallops RESPIRATORY:  No wheezing or rales. On 2L [place] at baseline.  ABDOMEN: Appears non-distended. No obvious abdominal masses. EXTREMITIES: No clubbing or cyanosis. No pitting edema.  Distal pedal pulses are 2+ bilaterally.   Assessment and Plan:   1. HFimpEF - Her ejection fraction was previously at 35 to 40% in 03/2022, improved to 55 to 60% by most recent imaging on 07/20/2022. She appears euvolemic by examination today.  - Continue current medical therapy with Bisoprolol 5mg  daily, Farxiga 10mg  daily, Entresto 24-26mg  BID and Spironolactone 25mg  daily. Follow-up labs were not drawn after her last visit. Orders are still in and will plan to obtain. If K+ remains elevated, would need to stop Spironolactone.   2. Persistent Atrial Fibrillation - She was on Amiodarone following her hospitalization and was back in normal sinus rhythm at the time of her follow-up visit on 05/17/2022. Remains in NSR by examination today. Continue Amiodarone 100mg  daily and Bisoprolol 5mg  daily. If she has a recurrent tremor, may need to discontinue Amiodarone. Scheduled for follow-up TSH and LFT's within the  next 3 months.  - No reports of active bleeding. Remains on Eliquis 5mg  BID for anticoagulation.   3. Coronary Calcification by CT -This was noted on prior CT imaging and most recent ischemic evaluation was a low risk NST in 2019. She previously declined repeat ischemic evaluation.  - Continue Bisoprolol 5mg  daily and Crestor 10mg  daily. No longer on ASA given the need for anticoagulation.   4. HTN - BP was initially recorded at 144/70, rechecked and improved to 136/62. Continue current medical therapy.   5. HLD - LDL was at 36 in 03/2022. Continue Crestor 10mg  daily.   Signed, Ellsworth Lennox, PA-C

## 2022-08-09 ENCOUNTER — Telehealth: Payer: Self-pay | Admitting: Internal Medicine

## 2022-08-09 ENCOUNTER — Telehealth: Payer: Self-pay | Admitting: *Deleted

## 2022-08-09 DIAGNOSIS — E875 Hyperkalemia: Secondary | ICD-10-CM

## 2022-08-09 NOTE — Telephone Encounter (Signed)
New order was placed  Spoke with the pt and confirmed that she is under the care of palliative care

## 2022-08-09 NOTE — Telephone Encounter (Signed)
-----   Message from Ellsworth Lennox sent at 08/08/2022  7:34 PM EDT ----- Regarding: Labs In reviewing this patient's chart, she was supposed to have follow-up labs after her visit in 05/2022. They mentioned having labs with Dr. Lodema Hong but I do not see them in the chart. Please see if they can take her to get a CBC and BMET this week (at [facility] or [facility])? She had hyperkalemia in 04/2022 and that is why we wanted to recheck this.   Thanks,  [place]

## 2022-08-09 NOTE — Telephone Encounter (Signed)
[name] received message from [place] with [employer] Please have nurse confirm if pt was walked in office for POC. If so we need Those results from that walk and it signed my provider.

## 2022-08-09 NOTE — Telephone Encounter (Signed)
Pt's granddaughter notified to have pt complete labs

## 2022-08-10 ENCOUNTER — Telehealth: Payer: Self-pay | Admitting: Internal Medicine

## 2022-08-10 NOTE — Telephone Encounter (Signed)
Please send over chart notes to [facility] for last appt in July w/Dr. Sherene Sires  Fax is [phone]

## 2022-08-10 NOTE — Telephone Encounter (Signed)
He wanted them to do the walk  She did not have walk

## 2022-08-10 NOTE — Telephone Encounter (Signed)
Ok I forwarded [name]'s response to [employer]

## 2022-08-13 NOTE — Telephone Encounter (Signed)
Nothing further needed as of today

## 2022-08-15 ENCOUNTER — Other Ambulatory Visit (HOSPITAL_COMMUNITY)
Admission: RE | Admit: 2022-08-15 | Discharge: 2022-08-15 | Disposition: A | Discharge status: Home / Self Care | Payer: 59 | Source: Ambulatory Visit | Attending: Student | Admitting: Student

## 2022-08-15 DIAGNOSIS — E875 Hyperkalemia: Secondary | ICD-10-CM | POA: Insufficient documentation

## 2022-08-15 DIAGNOSIS — E118 Type 2 diabetes mellitus with unspecified complications: Secondary | ICD-10-CM | POA: Diagnosis not present

## 2022-08-15 LAB — CBC
HCT: 45.2 % (ref 36.0–46.0)
Hemoglobin: 13.6 g/dL (ref 12.0–15.0)
MCH: 29.4 pg (ref 26.0–34.0)
MCHC: 30.1 g/dL (ref 30.0–36.0)
MCV: 97.8 fL (ref 80.0–100.0)
Platelets: 337 10*3/uL (ref 150–400)
RBC: 4.62 MIL/uL (ref 3.87–5.11)
RDW: 13.6 % (ref 11.5–15.5)
WBC: 11.4 10*3/uL — ABNORMAL HIGH (ref 4.0–10.5)
nRBC: 0 % (ref 0.0–0.2)

## 2022-08-15 LAB — BASIC METABOLIC PANEL
Anion gap: 12 (ref 5–15)
BUN: 21 mg/dL (ref 8–23)
CO2: 28 mmol/L (ref 22–32)
Calcium: 9.6 mg/dL (ref 8.9–10.3)
Chloride: 100 mmol/L (ref 98–111)
Creatinine, Ser: 1.07 mg/dL — ABNORMAL HIGH (ref 0.44–1.00)
GFR, Estimated: 53 mL/min — ABNORMAL LOW (ref >60)
Glucose, Bld: 131 mg/dL — ABNORMAL HIGH (ref 70–99)
Potassium: 4.9 mmol/L (ref 3.5–5.1)
Sodium: 140 mmol/L (ref 135–145)

## 2022-08-16 ENCOUNTER — Inpatient Hospital Stay: Payer: 59 | Attending: Hematology

## 2022-08-16 DIAGNOSIS — D75839 Thrombocytosis, unspecified: Secondary | ICD-10-CM | POA: Diagnosis not present

## 2022-08-16 DIAGNOSIS — M8580 Other specified disorders of bone density and structure, unspecified site: Secondary | ICD-10-CM | POA: Diagnosis not present

## 2022-08-16 DIAGNOSIS — D72829 Elevated white blood cell count, unspecified: Secondary | ICD-10-CM | POA: Insufficient documentation

## 2022-08-16 DIAGNOSIS — E559 Vitamin D deficiency, unspecified: Secondary | ICD-10-CM | POA: Insufficient documentation

## 2022-08-16 DIAGNOSIS — Z853 Personal history of malignant neoplasm of breast: Secondary | ICD-10-CM | POA: Insufficient documentation

## 2022-08-16 LAB — FERRITIN: Ferritin: 23 ng/mL (ref 11–307)

## 2022-08-16 LAB — CBC WITH DIFFERENTIAL/PLATELET
Abs Immature Granulocytes: 0.04 10*3/uL (ref 0.00–0.07)
Basophils Absolute: 0.1 10*3/uL (ref 0.0–0.1)
Basophils Relative: 1 %
Eosinophils Absolute: 0.2 10*3/uL (ref 0.0–0.5)
Eosinophils Relative: 2 %
HCT: 44.1 % (ref 36.0–46.0)
Hemoglobin: 13.7 g/dL (ref 12.0–15.0)
Immature Granulocytes: 0 %
Lymphocytes Relative: 21 %
Lymphs Abs: 2.3 10*3/uL (ref 0.7–4.0)
MCH: 29.4 pg (ref 26.0–34.0)
MCHC: 31.1 g/dL (ref 30.0–36.0)
MCV: 94.6 fL (ref 80.0–100.0)
Monocytes Absolute: 1.2 10*3/uL — ABNORMAL HIGH (ref 0.1–1.0)
Monocytes Relative: 11 %
Neutro Abs: 7.2 10*3/uL (ref 1.7–7.7)
Neutrophils Relative %: 65 %
Platelets: 346 10*3/uL (ref 150–400)
RBC: 4.66 MIL/uL (ref 3.87–5.11)
RDW: 13.4 % (ref 11.5–15.5)
WBC: 11 10*3/uL — ABNORMAL HIGH (ref 4.0–10.5)
nRBC: 0 % (ref 0.0–0.2)

## 2022-08-16 LAB — COMPREHENSIVE METABOLIC PANEL
ALT: 17 U/L (ref 0–44)
AST: 16 U/L (ref 15–41)
Albumin: 4 g/dL (ref 3.5–5.0)
Alkaline Phosphatase: 63 U/L (ref 38–126)
Anion gap: 10 (ref 5–15)
BUN: 21 mg/dL (ref 8–23)
CO2: 27 mmol/L (ref 22–32)
Calcium: 9.5 mg/dL (ref 8.9–10.3)
Chloride: 101 mmol/L (ref 98–111)
Creatinine, Ser: 0.98 mg/dL (ref 0.44–1.00)
GFR, Estimated: 59 mL/min — ABNORMAL LOW (ref >60)
Glucose, Bld: 95 mg/dL (ref 70–99)
Potassium: 4.8 mmol/L (ref 3.5–5.1)
Sodium: 138 mmol/L (ref 135–145)
Total Bilirubin: 0.6 mg/dL (ref 0.3–1.2)
Total Protein: 7.4 g/dL (ref 6.5–8.1)

## 2022-08-16 LAB — IRON AND TIBC
Iron: 28 ug/dL (ref 28–170)
Saturation Ratios: 8 % — ABNORMAL LOW (ref 10.4–31.8)
TIBC: 355 ug/dL (ref 250–450)
UIBC: 327 ug/dL

## 2022-08-16 LAB — VITAMIN D 25 HYDROXY (VIT D DEFICIENCY, FRACTURES): Vit D, 25-Hydroxy: 36.72 ng/mL (ref 30–100)

## 2022-08-17 DIAGNOSIS — J441 Chronic obstructive pulmonary disease with (acute) exacerbation: Secondary | ICD-10-CM | POA: Diagnosis not present

## 2022-08-17 DIAGNOSIS — M5410 Radiculopathy, site unspecified: Secondary | ICD-10-CM | POA: Diagnosis not present

## 2022-08-17 DIAGNOSIS — I5021 Acute systolic (congestive) heart failure: Secondary | ICD-10-CM | POA: Diagnosis not present

## 2022-08-17 DIAGNOSIS — J9601 Acute respiratory failure with hypoxia: Secondary | ICD-10-CM | POA: Diagnosis not present

## 2022-08-20 ENCOUNTER — Other Ambulatory Visit: Payer: Self-pay | Admitting: Family Medicine

## 2022-08-20 DIAGNOSIS — M5410 Radiculopathy, site unspecified: Secondary | ICD-10-CM | POA: Diagnosis not present

## 2022-08-20 DIAGNOSIS — J9601 Acute respiratory failure with hypoxia: Secondary | ICD-10-CM | POA: Diagnosis not present

## 2022-08-20 DIAGNOSIS — I5021 Acute systolic (congestive) heart failure: Secondary | ICD-10-CM | POA: Diagnosis not present

## 2022-08-20 DIAGNOSIS — J441 Chronic obstructive pulmonary disease with (acute) exacerbation: Secondary | ICD-10-CM | POA: Diagnosis not present

## 2022-08-22 NOTE — Progress Notes (Deleted)
[facility] [address][name], [name]   CLINIC:  Medical Oncology/Hematology  PCP:  Tanya Perches, MD [address], [address] / [place] [address] [phone]   REASON FOR VISIT:  Follow-up for left breast cancer, leukocytosis and osteopenia   PRIOR THERAPY: Antiestrogen therapy completed.   NGS Results: Not applicable.   CURRENT THERAPY: Surveillance  BRIEF ONCOLOGIC HISTORY:   Oncology History  Breast cancer (HCC)  11/15/2004 Surgery   Lumpectomy    - 02/22/2005 Radiation Therapy     12/11/2004 -  Chemotherapy   Aromasin    Adverse Reaction   Intolerance to Aromasin    - 01/14/2010 Chemotherapy   Femara     CANCER STAGING: Cancer Staging  Breast cancer (HCC) Staging form: Breast, AJCC 7th Edition - Clinical: Stage IA (T1b, N0, cM0) - Signed by Tanya Newer, PA on 03/11/2012   INTERVAL HISTORY:   [name], a 77 y.o. female, returns for routine follow-up of her history of breast cancer as well as osteopenia and leukocytosis. [name] was last seen on 08/31/2021 by Dr. Ellin [name].   At today's visit, she  reports feeling ***.  She denies any recent hospitalizations, surgeries, or changes in her  baseline health status.  She denies any symptoms of recurrence such as new lumps, bone pain, chest pain, dyspnea, or abdominal pain. *** *** She has no new headaches, seizures, or focal neurologic deficits.  *** No B symptoms such as fever, chills, night sweats, unintentional weight loss.  *** She continues to smoke 5 to 10 cigarettes daily. *** Recent infections, antibiotics, steroids *** B symptoms *** New lumps or bumps *** Bleeding, pica, fatigue  She reports ***% energy and ***% appetite.  She is maintaining stable weight at this time.   ASSESSMENT & PLAN:  1.  Stage I (T1 N0 M0) left breast IDC: - Diagnosis in 2006, status post lumpectomy and SLNB. - Pathology: 1 cm low-grade IDC, 0/1 lymph node  positive, estrogen positive, progesterone positive, Ki-67 1%, HER2 1+. - Completed adjuvant radiation therapy around 02/22/2005. - Aromasin started on 11/2004, switched to Femara due to intolerance.  Completed 5 years of Femara on 01/14/2010. - *** - Reviewed mammogram from 08/11/2021, BI-RADS Category 2.  She is overdue for annual mammogram. - Physical exam today (08/23/2022) *** - Labs (08/16/2022) show baseline CBC/D, normal creatinine and LFTs.  CA 15-3 and CA 27.29 are within normal limits. - No "red flag" symptoms per history/ROS - PLAN: She is due for annual screening mammogram. - Otherwise, RTC in 1 year for annual follow-up of breast cancer.  2.  Leukocytosis and thrombocytosis: - She has a leukocytosis and thrombocytosis since 2008. - No B symptoms, frequent infections, or steroids *** - BCR/ABL FISH negative. - She is a current everyday smoker. - Most recent labs (08/16/2022): WBC 11.0, monocytes 1.2.  Normal platelets 346. - PLAN: Most likely smoking induced.  Will continue to monitor and check JAK2 with reflex prior to follow-up visit.  3.  Iron deficiency without anemia - Labs from 08/16/2022 show ferritin 23, iron saturation 8% - Colonoscopy (07/12/2020): Nonbleeding internal hemorrhoids, diverticulosis, polyp x 1 - Endoscopy (05/20/2017): Normal esophagus, mild gastritis. - *** Bleeding, pica, fatigue *** - PLAN: Recommend starting daily ferrous sulfate 325 mg.  Will recheck iron panel at follow-up visit in 1 year.  4.  Osteopenia: - Vitamin D level was 36.72 in August 2024. - Last denosumab was on 12/02/2020. -  Last bone density scan 04/18/2020 showed T-score -2.0, osteopenic - PLAN: Since she is no longer on breast cancer treatment, we will defer ongoing management of osteopenia to her PCP.  5.   Smoking history: - Lung cancer screening scan on 03/23/2022 was lung RADS 2, benign appearance/behavior.  6.  Family/social history: - Continues to smoke 5-10 cigarettes/day.  Started  smoking at age 9. - Mother had breast cancer.  PLAN SUMMARY: >> *** >> *** >> ***   REVIEW OF SYSTEMS: ***  Review of Systems - Oncology  PHYSICAL EXAM:   Performance status (ECOG): {CHL ONC QV:[id number]} *** There were no vitals filed for this visit. Wt Readings from Last 3 Encounters:  08/08/22 167 lb (75.8 kg)  07/25/22 159 lb (72.1 kg)  07/25/22 160 lb (72.6 kg)   Physical Exam   PAST MEDICAL/SURGICAL HISTORY:  Past Medical History:  Diagnosis Date   Aortic atherosclerosis (HCC)    Breast cancer (HCC) 2007   Stage I (T1b N0 M0), grade 1 well-differentiated carcinoma of the left breast status, post lumpectomy followed by radiation therapy. Her estrogen receptor receptors were 93%, progesterone receptors 67%. HER-2/neu was negative. No lymphovascular space invasion was seen. All margins were clear. Ki-67 marker was low at 1% with surgery on 11/15/2004. Treated then with post-lumpectomy radiation, finish   Breast cancer, left breast (HCC) 2007   COPD (chronic obstructive pulmonary disease) (HCC)    Coronary artery calcification seen on CAT scan    Diabetes mellitus without complication (HCC)    diet controlled   Diverticula of colon    Hx of rickettsial disease    Hyperlipidemia    Hypertension    Kidney stones    Nicotine dependence    Osteoarthritis    Past Surgical History:  Procedure Laterality Date   ABDOMINAL HYSTERECTOMY     BREAST SURGERY  2005 approx   left lumpectomy    CATARACT EXTRACTION W/PHACO Left 05/01/2019   Procedure: CATARACT EXTRACTION PHACO AND INTRAOCULAR LENS PLACEMENT LEFT EYE CDE=11.73;  Surgeon: Tanya Pierce, MD;  Location: [facility];  Service: Ophthalmology;  Laterality: Left;  left   CATARACT EXTRACTION W/PHACO Right 05/15/2019   Procedure: CATARACT EXTRACTION PHACO AND INTRAOCULAR LENS PLACEMENT RIGHT EYE;  Surgeon: Tanya Pierce, MD;  Location: [facility];  Service: Ophthalmology;  Laterality: Right;  CDE: 16.07   CHOLECYSTECTOMY N/A  05/26/2014   Procedure: LAPAROSCOPIC CHOLECYSTECTOMY;  Surgeon: Tanya Macho Md, MD;  Location: [facility];  Service: General;  Laterality: N/A;   COLECTOMY     2005, diverticulitis   COLONOSCOPY N/A 05/20/2017    Surgeon: Tanya Bali, MD;  five 3-6 mm polyps (4 tubular adenomas, 1 hyperplastic polyp), tortuous left colon, internal hemorrhoids.  Recommended repeat colonoscopy in 3 years.   COLONOSCOPY WITH PROPOFOL N/A 07/12/2020   Procedure: COLONOSCOPY WITH PROPOFOL;  Surgeon: Lanelle Bal, DO;  Location: [facility];  Service: Endoscopy;  Laterality: N/A;  12:30pm   DILATION AND CURETTAGE OF UTERUS     ESOPHAGOGASTRODUODENOSCOPY N/A 05/20/2017   Surgeon: Tanya Bali, MD; normal esophagus and mild gastritis due to aspirin.  Biopsies negative for H. pylori.   left breast      cancer, in 2006   POLYPECTOMY  07/12/2020   Procedure: POLYPECTOMY;  Surgeon: Lanelle Bal, DO;  Location: [facility];  Service: Endoscopy;;   TUBAL LIGATION      SOCIAL HISTORY:  Social History   Socioeconomic History   Marital status: Single  Spouse name: Not on file   Number of children: 5   Years of education: 9th grade    Highest education level: 9th grade  Occupational History   Occupation: retired    Associate Professor: RETIRED  Tobacco Use   Smoking status: Former    Current packs/day: 0.00    Average packs/day: 0.5 packs/day for 50.0 years (25.0 ttl pk-yrs)    Types: Cigarettes    Start date: 04/08/1972    Quit date: 04/09/2022    Years since quitting: 0.3   Smokeless tobacco: Never   Tobacco comments:    smokes 1/2 pack per day 11/19/2019  Vaping Use   Vaping status: Never Used  Substance and Sexual Activity   Alcohol use: No    Alcohol/week: 0.0 standard drinks of alcohol   Drug use: No   Sexual activity: Not Currently  Other Topics Concern   Not on file  Social History Narrative   ** Merged History Encounter ** 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 QUIT  WORKING 2003 AFTER CANCER DIAGNOSIS.      MARRIED WITH A RUN AWAY HUSBAND(FUGITIVE 10 YRS). 3 MISCARRIAGES, 5 CHILDREN   Right handed    Social Determinants of Health   Financial Resource Strain: Low Risk  (02/19/2022)   Overall Financial Resource Strain (CARDIA)    Difficulty of Paying Living Expenses: Not hard at all  Food Insecurity: No Food Insecurity (04/18/2022)   Hunger Vital Sign    Worried About Running Out of Food in the Last Year: Never true    Ran Out of Food in the Last Year: Never true  Transportation Needs: No Transportation Needs (04/07/2022)   PRAPARE - Administrator, Civil Service (Medical): No    Lack of Transportation (Non-Medical): No  Physical Activity: Insufficiently Active (02/19/2022)   Exercise Vital Sign    Days of Exercise per Week: 5 days    Minutes of Exercise per Session: 20 min  Stress: No Stress Concern Present (02/19/2022)   [employer] of Occupational Health - Occupational Stress Questionnaire    Feeling of Stress : Not at all  Social Connections: Socially Isolated (02/19/2022)   Social Connection and Isolation Panel [NHANES]    Frequency of Communication with Friends and Family: More than three times a week    Frequency of Social Gatherings with Friends and Family: More than three times a week    Attends Religious Services: Never    Database administrator or Organizations: No    Attends Banker Meetings: Never    Marital Status: Widowed  Intimate Partner Violence: Not At Risk (04/07/2022)   Humiliation, Afraid, Rape, and Kick questionnaire    Fear of Current or Ex-Partner: No    Emotionally Abused: No    Physically Abused: No    Sexually Abused: No    FAMILY HISTORY:  Family History  Problem Relation Age of Onset   Breast cancer Mother    COPD Father    Emphysema Father    Throat cancer Sister    Glaucoma Brother    Schizophrenia Brother    Heart attack Brother    Lung cancer Sister        former smoker    Hepatitis C Daughter    Seizures Daughter    SIDS Son    Colon cancer Neg Hx    Colon polyps Neg Hx     CURRENT MEDICATIONS:  Current Outpatient Medications  Medication Sig Dispense Refill   acetaminophen (  TYLENOL) 325 MG tablet Take 650 mg by mouth every 6 (six) hours as needed for moderate pain.      albuterol (PROVENTIL) (2.5 MG/3ML) 0.083% nebulizer solution INHALE ONE VIAL VIA NEBULIZER THREE TIMES DAILY. (Patient taking differently: Take 2.5 mg by nebulization 3 (three) times daily as needed for wheezing or shortness of breath.) 300 mL 0   albuterol (VENTOLIN HFA) 108 (90 Base) MCG/ACT inhaler INHALE (2) PUFFS EVERY FOURHOURS AS NEEDED ONLY IF YOU CANT CATCH YOUR BREATH (Patient taking differently: Inhale 2 puffs into the lungs every 4 (four) hours as needed for wheezing or shortness of breath. INHALE (2) PUFFS EVERY FOURHOURS AS NEEDED ONLY IF YOU CANT CATCH YOUR BREATH) 8.5 g 0   amiodarone (PACERONE) 200 MG tablet Take 0.5 tablets (100 mg total) by mouth daily. 45 tablet 3   apixaban (ELIQUIS) 5 MG TABS tablet Take 1 tablet (5 mg total) by mouth 2 (two) times daily. 60 tablet 5   ATROVENT HFA 17 MCG/ACT inhaler INHALE 2 PUFFS BY MOUTH FOUR TIMES A DAY. 12.9 g 0   bisoprolol (ZEBETA) 5 MG tablet TAKE ONE TABLET BY MOUTH EVERY DAY 30 tablet 0   budesonide-formoterol (SYMBICORT) 160-4.5 MCG/ACT inhaler Inhale 2 puffs into the lungs 2 (two) times daily. 10.2 g 0   Calcium Carbonate-Vitamin D (CALCIUM 600 + D PO) Take 1 tablet by mouth 2 (two) times daily.     Cyanocobalamin (VITAMIN B 12) 500 MCG TABS Take 500 mcg by mouth daily. Take one tablet by mouth once daily 90 tablet 3   cyclobenzaprine (FLEXERIL) 5 MG tablet Take 1 tablet (5 mg total) by mouth at bedtime. 30 tablet 5   ENTRESTO 24-26 MG TAKE ONE TABLET BY MOUTH 2 TIMES A DAY 60 tablet 0   FARXIGA 10 MG TABS tablet TAKE 1 TABLET BY MOUTH DAILY 30 tablet 0   fluticasone (FLONASE) 50 MCG/ACT nasal spray PLACE 2 SPRAYS IN EACH  NOSTRIL DAILY. 16 g 0   hydrOXYzine (ATARAX) 25 MG tablet Take 1 tablet (25 mg total) by mouth 2 (two) times daily as needed for itching.     Misc. Devices MISC Please provide patient with mastectomy bra and prosthesis. Dx: History of left breast cancer 1 each 0   Naphazoline HCl (CLEAR EYES OP) Place 1 drop into both eyes daily as needed (irritation).     nicotine (NICODERM CQ - DOSED IN MG/24 HR) 7 mg/24hr patch APPLY 1 PATCH ONTO THE SKIN ONCE DAILY (Patient not taking: Reported on 08/08/2022) 28 patch 0   pantoprazole (PROTONIX) 40 MG tablet TAKE ONE TABLET BY MOUTH ONCE DAILY BEFORE BREAKFAST. 90 tablet 0   polyethylene glycol powder (GLYCOLAX/MIRALAX) powder MIX 1 CAPFUL IN 8 OUNCES OF JUICE OR WATER AND DRINK ONCE DAILY. (Patient taking differently: Take 17 g by mouth daily.) 527 g 5   rosuvastatin (CRESTOR) 10 MG tablet Take 1 tablet (10 mg total) by mouth daily. 90 tablet 1   spironolactone (ALDACTONE) 25 MG tablet TAKE 1 TABLET BY MOUTH DAILY 30 tablet 0   traMADol (ULTRAM) 50 MG tablet One tablet every eight hours as needed for pain. 60 tablet 3   No current facility-administered medications for this visit.    ALLERGIES:  Allergies  Allergen Reactions   Penicillins Hives, Shortness Of Breath and Swelling    Has patient had a PCN reaction causing immediate rash, facial/tongue/throat swelling, SOB or lightheadedness with hypotension: yes Has patient had a PCN reaction causing severe rash involving mucus membranes  or skin necrosis:no Has patient had a PCN reaction that required hospitalization: yes Has patient had a PCN reaction occurring within the last 10 years: no If all of the above answers are "NO", then may proceed with Cephalosporin use. Tolerates rocephin/keflex   Sulfonamide Derivatives Other (See Comments)    Shaking all over, seizure like symptoms. Hospitalization resulted    Avapro [Irbesartan] Other (See Comments)    Pt associates avapro with bruising though aware  scientifically not the case, wants to chanmge med   Levaquin [Levofloxacin] Itching    LABORATORY DATA:  I have reviewed the labs as listed.     Latest Ref Rng & Units 08/16/2022    1:57 PM 08/15/2022   11:36 AM 04/17/2022    4:44 AM  CBC  WBC 4.0 - 10.5 K/uL 11.0  11.4  18.6   Hemoglobin 12.0 - 15.0 g/dL 78.2  95.6  21.3   Hematocrit 36.0 - 46.0 % 44.1  45.2  48.4   Platelets 150 - 400 K/uL 346  337  428       Latest Ref Rng & Units 08/16/2022    1:57 PM 08/15/2022   11:36 AM 04/17/2022   11:20 AM  CMP  Glucose 70 - 99 mg/dL 95  086  578   BUN 8 - 23 mg/dL 21  21  45   Creatinine 0.44 - 1.00 mg/dL 4.69  6.29  5.28   Sodium 135 - 145 mmol/L 138  140  134   Potassium 3.5 - 5.1 mmol/L 4.8  4.9  5.3   Chloride 98 - 111 mmol/L 101  100  100   CO2 22 - 32 mmol/L 27  28  26    Calcium 8.9 - 10.3 mg/dL 9.5  9.6  41.3   Total Protein 6.5 - 8.1 g/dL 7.4     Total Bilirubin 0.3 - 1.2 mg/dL 0.6     Alkaline Phos 38 - 126 U/L 63     AST 15 - 41 U/L 16     ALT 0 - 44 U/L 17       DIAGNOSTIC IMAGING:  I have independently reviewed the scans and discussed with the patient. No results found.   WRAP UP:  All questions were answered. The patient knows to call the clinic with any problems, questions or concerns.  Medical decision making: ***  Time spent on visit: I spent {CHL ONC TIME VISIT - [id number]} counseling the patient face to face. The total time spent in the appointment was {CHL ONC TIME VISIT - [id number]} and more than 50% was on counseling.  Carnella Gu[place], PA-C  ***

## 2022-08-23 ENCOUNTER — Inpatient Hospital Stay: Payer: 59 | Admitting: Physician Assistant

## 2022-08-23 ENCOUNTER — Other Ambulatory Visit: Payer: Self-pay | Admitting: Physician Assistant

## 2022-08-23 DIAGNOSIS — Z122 Encounter for screening for malignant neoplasm of respiratory organs: Secondary | ICD-10-CM

## 2022-08-23 DIAGNOSIS — Z1231 Encounter for screening mammogram for malignant neoplasm of breast: Secondary | ICD-10-CM

## 2022-08-23 DIAGNOSIS — Z170 Estrogen receptor positive status [ER+]: Secondary | ICD-10-CM

## 2022-08-26 DIAGNOSIS — J449 Chronic obstructive pulmonary disease, unspecified: Secondary | ICD-10-CM | POA: Diagnosis not present

## 2022-08-26 DIAGNOSIS — I5021 Acute systolic (congestive) heart failure: Secondary | ICD-10-CM | POA: Diagnosis not present

## 2022-08-30 ENCOUNTER — Telehealth: Payer: Self-pay | Admitting: Internal Medicine

## 2022-08-30 NOTE — Telephone Encounter (Signed)
Thayer Ohm states needs office notes to include the use of oxygen. Patient needs to be renewed for oxygen. Thayer Ohm phone number is [phone].

## 2022-08-31 NOTE — Telephone Encounter (Signed)
Patient was not walked at LOV as she was already on O2. Community message sent to [place] to advise.

## 2022-08-31 NOTE — Telephone Encounter (Signed)
OV notes viewable in Epic by [employer].

## 2022-09-05 ENCOUNTER — Other Ambulatory Visit: Payer: Self-pay | Admitting: Family Medicine

## 2022-09-05 DIAGNOSIS — J4489 Other specified chronic obstructive pulmonary disease: Secondary | ICD-10-CM

## 2022-09-05 DIAGNOSIS — R918 Other nonspecific abnormal finding of lung field: Secondary | ICD-10-CM

## 2022-09-06 ENCOUNTER — Other Ambulatory Visit: Payer: Self-pay | Admitting: Family Medicine

## 2022-09-10 DIAGNOSIS — J449 Chronic obstructive pulmonary disease, unspecified: Secondary | ICD-10-CM | POA: Diagnosis not present

## 2022-09-10 DIAGNOSIS — Z515 Encounter for palliative care: Secondary | ICD-10-CM | POA: Diagnosis not present

## 2022-09-10 DIAGNOSIS — J9610 Chronic respiratory failure, unspecified whether with hypoxia or hypercapnia: Secondary | ICD-10-CM | POA: Diagnosis not present

## 2022-09-10 DIAGNOSIS — I5022 Chronic systolic (congestive) heart failure: Secondary | ICD-10-CM | POA: Diagnosis not present

## 2022-09-18 ENCOUNTER — Other Ambulatory Visit: Payer: Self-pay | Admitting: Family Medicine

## 2022-09-18 DIAGNOSIS — R918 Other nonspecific abnormal finding of lung field: Secondary | ICD-10-CM

## 2022-09-18 DIAGNOSIS — J4489 Other specified chronic obstructive pulmonary disease: Secondary | ICD-10-CM

## 2022-09-20 ENCOUNTER — Telehealth: Payer: Self-pay | Admitting: Student

## 2022-09-20 NOTE — Telephone Encounter (Signed)
[facility] is calling to talk with [name] or nurse about patient.

## 2022-09-20 NOTE — Telephone Encounter (Signed)
Returned call to [facility]. Spoke with Renae Fickle who states that an order is needed for oxygen. Notified that the PCP would need to be contacted.

## 2022-09-22 ENCOUNTER — Other Ambulatory Visit: Payer: Self-pay | Admitting: Family Medicine

## 2022-09-26 DIAGNOSIS — J449 Chronic obstructive pulmonary disease, unspecified: Secondary | ICD-10-CM | POA: Diagnosis not present

## 2022-09-26 DIAGNOSIS — I5021 Acute systolic (congestive) heart failure: Secondary | ICD-10-CM | POA: Diagnosis not present

## 2022-10-02 ENCOUNTER — Encounter: Payer: Self-pay | Admitting: Orthopaedic Surgery

## 2022-10-02 ENCOUNTER — Ambulatory Visit (INDEPENDENT_AMBULATORY_CARE_PROVIDER_SITE_OTHER): Payer: 59 | Admitting: Orthopaedic Surgery

## 2022-10-02 ENCOUNTER — Other Ambulatory Visit (INDEPENDENT_AMBULATORY_CARE_PROVIDER_SITE_OTHER): Payer: 59

## 2022-10-02 ENCOUNTER — Inpatient Hospital Stay: Payer: 59 | Admitting: Physician Assistant

## 2022-10-02 DIAGNOSIS — M25461 Effusion, right knee: Secondary | ICD-10-CM

## 2022-10-02 DIAGNOSIS — G8929 Other chronic pain: Secondary | ICD-10-CM

## 2022-10-02 DIAGNOSIS — M25561 Pain in right knee: Secondary | ICD-10-CM | POA: Diagnosis not present

## 2022-10-02 NOTE — Progress Notes (Signed)
I fell.  She fell at home yesterday and hurt her right knee.  She has pain today and some bruising and pain standing on it.  She had no head injury.  She is on anti-coagulation.  She has no weakness.  She had no head injury.  Right knee has some ecchymosis anteriorly. ROM is good 0 to 105, pain when standing more medially but tolerable.  NV intact,  Knee is stable.  Effusion and crepitus are present.  X-rays were done of the right knee, reported separately.  Has effusion but no fracture.  Encounter Diagnoses  Name Primary?   Chronic pain of right knee Yes   Acute pain of right knee    I have told her to use ice to the knee.  She will get more bruising secondary to the anti-coagulation.  She has pain medicine.  Return in one week.  Call if any problem.  Precautions discussed.  Electronically Signed Darreld Mclean, MD 9/17/20243:11 PM

## 2022-10-06 NOTE — Progress Notes (Unsigned)
[facility] [address], [place] [address]   CLINIC:  Medical Oncology/Hematology  PCP:  Kerri Perches, MD [address] / [place] [address] [phone]   REASON FOR VISIT:  Follow-up for left breast cancer, leukocytosis and osteopenia   PRIOR THERAPY: Antiestrogen therapy completed.   NGS Results: Not applicable.   CURRENT THERAPY: Surveillance  BRIEF ONCOLOGIC HISTORY:   Oncology History  Breast cancer (HCC)  11/15/2004 Surgery   Lumpectomy    - 02/22/2005 Radiation Therapy     12/11/2004 -  Chemotherapy   Aromasin    Adverse Reaction   Intolerance to Aromasin    - 01/14/2010 Chemotherapy   Femara     CANCER STAGING: Cancer Staging  Breast cancer (HCC) Staging form: Breast, AJCC 7th Edition - Clinical: Stage IA (T1b, N0, cM0) - Signed by Ellouise Newer, PA on 03/11/2012   INTERVAL HISTORY:   [name], a 77 y.o. female, returns for routine follow-up of her history of breast cancer as well as osteopenia and leukocytosis. Tanya was last seen on 08/31/2021 by Dr. Ellin Saba.   At today's visit, she  reports feeling ***.  She denies any recent hospitalizations, surgeries, or changes in her  baseline health status.  She denies any symptoms of recurrence such as new lumps, bone pain, chest pain, dyspnea, or abdominal pain. *** *** She has no new headaches, seizures, or focal neurologic deficits.  *** No B symptoms such as fever, chills, night sweats, unintentional weight loss.  *** She continues to smoke 5 to 10 cigarettes daily. *** Recent infections, antibiotics, steroids *** B symptoms *** New lumps or bumps *** Bleeding, pica, fatigue  She reports ***% energy and ***% appetite.  She is maintaining stable weight at this time.   ASSESSMENT & PLAN:  1.  Stage I (T1 N0 M0) left breast IDC: - Diagnosis in 2006, status post lumpectomy and SLNB. - Pathology: 1 cm low-grade IDC, 0/1 lymph node  positive, estrogen positive, progesterone positive, Ki-67 1%, HER2 1+. - Completed adjuvant radiation therapy around 02/22/2005. - Aromasin started on 11/2004, switched to Femara due to intolerance.  Completed 5 years of Femara on 01/14/2010. - *** - Reviewed mammogram from 08/11/2021, BI-RADS Category 2.  She is overdue for annual mammogram. - Physical exam today (08/23/2022) *** - Labs (08/16/2022) show baseline CBC/D, normal creatinine and LFTs.  CA 15-3 and CA 27.29 are within normal limits. - No "red flag" symptoms per history/ROS - PLAN: She is due for annual screening mammogram. - Otherwise, RTC in 1 year for annual follow-up of breast cancer, including CA15-13 and CA27.29 ***???***  2.  Leukocytosis and thrombocytosis: - She has a leukocytosis and thrombocytosis since 2008. - No B symptoms, frequent infections, or steroids *** - BCR/ABL FISH negative. - She is a current everyday smoker. - Most recent labs (08/16/2022): WBC 11.0, monocytes 1.2.  Normal platelets 346. - PLAN: Most likely smoking induced.  Will continue to monitor and check JAK2 with reflex prior to follow-up visit.  3.  Iron deficiency without anemia - Labs from 08/16/2022 show ferritin 23, iron saturation 8% - Colonoscopy (07/12/2020): Nonbleeding internal hemorrhoids, diverticulosis, polyp x 1 - Endoscopy (05/20/2017): Normal esophagus, mild gastritis. - *** Bleeding, pica, fatigue *** - PLAN: Recommend starting daily ferrous sulfate 325 mg.  Will recheck iron panel at follow-up visit in 1 year.  4.  Osteopenia: - Vitamin D level was 36.72 in August 2024. - Last  denosumab was on 12/02/2020. - Last bone density scan 04/18/2020 showed T-score -2.0, osteopenic - PLAN: Since she is no longer on breast cancer treatment, we will defer ongoing management of osteopenia to her PCP.  5.   Smoking history: - Lung cancer screening scan on 03/23/2022 was lung RADS 2, benign appearance/behavior.  6.  Family/social history: - Continues to  smoke 5-10 cigarettes/day.  Started smoking at age 68. - Mother had breast cancer.  PLAN SUMMARY: >> *** >> *** >> ***    REVIEW OF SYSTEMS: ***  Review of Systems - Oncology  PHYSICAL EXAM:   Performance status (ECOG): {CHL ONC UK:[id number]} *** There were no vitals filed for this visit. Wt Readings from Last 3 Encounters:  08/08/22 167 lb (75.8 kg)  07/25/22 159 lb (72.1 kg)  07/25/22 160 lb (72.6 kg)   Physical Exam   PAST MEDICAL/SURGICAL HISTORY:  Past Medical History:  Diagnosis Date   Aortic atherosclerosis (HCC)    Breast cancer (HCC) 2007   Stage I (T1b N0 M0), grade 1 well-differentiated carcinoma of the left breast status, post lumpectomy followed by radiation therapy. Her estrogen receptor receptors were 93%, progesterone receptors 67%. HER-2/neu was negative. No lymphovascular space invasion was seen. All margins were clear. Ki-67 marker was low at 1% with surgery on 11/15/2004. Treated then with post-lumpectomy radiation, finish   Breast cancer, left breast (HCC) 2007   COPD (chronic obstructive pulmonary disease) (HCC)    Coronary artery calcification seen on CAT scan    Diabetes mellitus without complication (HCC)    diet controlled   Diverticula of colon    Hx of rickettsial disease    Hyperlipidemia    Hypertension    Kidney stones    Nicotine dependence    Osteoarthritis    Past Surgical History:  Procedure Laterality Date   ABDOMINAL HYSTERECTOMY     BREAST SURGERY  2005 approx   left lumpectomy    CATARACT EXTRACTION W/PHACO Left 05/01/2019   Procedure: CATARACT EXTRACTION PHACO AND INTRAOCULAR LENS PLACEMENT LEFT EYE CDE=11.73;  Surgeon: Fabio Pierce, MD;  Location: [facility];  Service: Ophthalmology;  Laterality: Left;  left   CATARACT EXTRACTION W/PHACO Right 05/15/2019   Procedure: CATARACT EXTRACTION PHACO AND INTRAOCULAR LENS PLACEMENT RIGHT EYE;  Surgeon: Fabio Pierce, MD;  Location: [facility];  Service: Ophthalmology;  Laterality: Right;   CDE: 16.07   CHOLECYSTECTOMY N/A 05/26/2014   Procedure: LAPAROSCOPIC CHOLECYSTECTOMY;  Surgeon: Franky Macho Md, MD;  Location: [facility];  Service: General;  Laterality: N/A;   COLECTOMY     2005, diverticulitis   COLONOSCOPY N/A 05/20/2017    Surgeon: West Bali, MD;  five 3-6 mm polyps (4 tubular adenomas, 1 hyperplastic polyp), tortuous left colon, internal hemorrhoids.  Recommended repeat colonoscopy in 3 years.   COLONOSCOPY WITH PROPOFOL N/A 07/12/2020   Procedure: COLONOSCOPY WITH PROPOFOL;  Surgeon: Lanelle Bal, DO;  Location: [facility];  Service: Endoscopy;  Laterality: N/A;  12:30pm   DILATION AND CURETTAGE OF UTERUS     ESOPHAGOGASTRODUODENOSCOPY N/A 05/20/2017   Surgeon: West Bali, MD; normal esophagus and mild gastritis due to aspirin.  Biopsies negative for H. pylori.   left breast      cancer, in 2006   POLYPECTOMY  07/12/2020   Procedure: POLYPECTOMY;  Surgeon: Lanelle Bal, DO;  Location: [facility];  Service: Endoscopy;;   TUBAL LIGATION      SOCIAL HISTORY:  Social History   Socioeconomic History  Marital status: Single    Spouse name: Not on file   Number of children: 5   Years of education: 9th grade    Highest education level: 9th grade  Occupational History   Occupation: retired    Associate Professor: RETIRED  Tobacco Use   Smoking status: Former    Current packs/day: 0.00    Average packs/day: 0.5 packs/day for 50.0 years (25.0 ttl pk-yrs)    Types: Cigarettes    Start date: 04/08/1972    Quit date: 04/09/2022    Years since quitting: 0.4   Smokeless tobacco: Never   Tobacco comments:    smokes 1/2 pack per day 11/19/2019  Vaping Use   Vaping status: Never Used  Substance and Sexual Activity   Alcohol use: No    Alcohol/week: 0.0 standard drinks of alcohol   Drug use: No   Sexual activity: Not Currently  Other Topics Concern   Not on file  Social History Narrative   ** Merged History Encounter ** 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 QUIT WORKING 2003 AFTER CANCER DIAGNOSIS.      MARRIED WITH A RUN AWAY HUSBAND(FUGITIVE 10 YRS). 3 MISCARRIAGES, 5 CHILDREN   Right handed    Social Determinants of Health   Financial Resource Strain: Low Risk  (02/19/2022)   Overall Financial Resource Strain (CARDIA)    Difficulty of Paying Living Expenses: Not hard at all  Food Insecurity: No Food Insecurity (04/18/2022)   Hunger Vital Sign    Worried About Running Out of Food in the Last Year: Never true    Ran Out of Food in the Last Year: Never true  Transportation Needs: No Transportation Needs (04/07/2022)   PRAPARE - Administrator, Civil Service (Medical): No    Lack of Transportation (Non-Medical): No  Physical Activity: Insufficiently Active (02/19/2022)   Exercise Vital Sign    Days of Exercise per Week: 5 days    Minutes of Exercise per Session: 20 min  Stress: No Stress Concern Present (02/19/2022)   [employer] of Occupational Health - Occupational Stress Questionnaire    Feeling of Stress : Not at all  Social Connections: Socially Isolated (02/19/2022)   Social Connection and Isolation Panel [NHANES]    Frequency of Communication with Friends and Family: More than three times a week    Frequency of Social Gatherings with Friends and Family: More than three times a week    Attends Religious Services: Never    Database administrator or Organizations: No    Attends Banker Meetings: Never    Marital Status: Widowed  Intimate Partner Violence: Not At Risk (04/07/2022)   Humiliation, Afraid, Rape, and Kick questionnaire    Fear of Current or Ex-Partner: No    Emotionally Abused: No    Physically Abused: No    Sexually Abused: No    FAMILY HISTORY:  Family History  Problem Relation Age of Onset   Breast cancer Mother    COPD Father    Emphysema Father    Throat cancer Sister    Glaucoma Brother    Schizophrenia Brother    Heart attack Brother    Lung cancer  Sister        former smoker   Hepatitis C Daughter    Seizures Daughter    SIDS Son    Colon cancer Neg Hx    Colon polyps Neg Hx     CURRENT MEDICATIONS:  Current Outpatient Medications  Medication  Sig Dispense Refill   acetaminophen (TYLENOL) 325 MG tablet Take 650 mg by mouth every 6 (six) hours as needed for moderate pain.      albuterol (PROVENTIL) (2.5 MG/3ML) 0.083% nebulizer solution INHALE ONE VIAL VIA NEBULIZER THREE TIMES DAILY. (Patient taking differently: Take 2.5 mg by nebulization 3 (three) times daily as needed for wheezing or shortness of breath.) 300 mL 0   albuterol (VENTOLIN HFA) 108 (90 Base) MCG/ACT inhaler INHALE (2) PUFFS EVERY FOURHOURS AS NEEDED ONLY IF YOU CANT CATCH YOUR BREATH (Patient taking differently: Inhale 2 puffs into the lungs every 4 (four) hours as needed for wheezing or shortness of breath. INHALE (2) PUFFS EVERY FOURHOURS AS NEEDED ONLY IF YOU CANT CATCH YOUR BREATH) 8.5 g 0   amiodarone (PACERONE) 200 MG tablet Take 0.5 tablets (100 mg total) by mouth daily. 45 tablet 3   amLODipine (NORVASC) 5 MG tablet TAKE (1) TABLET BY MOUOTH ONCE DAILY. 30 tablet 0   apixaban (ELIQUIS) 5 MG TABS tablet Take 1 tablet (5 mg total) by mouth 2 (two) times daily. 60 tablet 5   ATROVENT HFA 17 MCG/ACT inhaler INHALE 2 PUFFS BY MOUTH FOUR TIMES A DAY. 12.9 g 0   bisoprolol (ZEBETA) 5 MG tablet TAKE ONE TABLET BY MOUTH EVERY DAY 30 tablet 0   Calcium Carbonate-Vitamin D (CALCIUM 600 + D PO) Take 1 tablet by mouth 2 (two) times daily.     Cyanocobalamin (VITAMIN B 12) 500 MCG TABS Take 500 mcg by mouth daily. Take one tablet by mouth once daily 90 tablet 3   cyclobenzaprine (FLEXERIL) 5 MG tablet Take 1 tablet (5 mg total) by mouth at bedtime. 30 tablet 5   ENTRESTO 24-26 MG TAKE ONE TABLET BY MOUTH 2 TIMES A DAY 60 tablet 0   FARXIGA 10 MG TABS tablet TAKE 1 TABLET BY MOUTH DAILY 30 tablet 0   fluticasone (FLONASE) 50 MCG/ACT nasal spray PLACE 2 SPRAYS IN EACH NOSTRIL  DAILY. 16 g 0   hydrOXYzine (ATARAX) 25 MG tablet Take 1 tablet (25 mg total) by mouth 2 (two) times daily as needed for itching.     Misc. Devices MISC Please provide patient with mastectomy bra and prosthesis. Dx: History of left breast cancer 1 each 0   Naphazoline HCl (CLEAR EYES OP) Place 1 drop into both eyes daily as needed (irritation).     nicotine (NICODERM CQ - DOSED IN MG/24 HR) 7 mg/24hr patch APPLY 1 PATCH ONTO THE SKIN ONCE DAILY 28 patch 0   pantoprazole (PROTONIX) 40 MG tablet TAKE ONE TABLET BY MOUTH ONCE DAILY BEFORE BREAKFAST. 90 tablet 0   polyethylene glycol powder (GLYCOLAX/MIRALAX) powder MIX 1 CAPFUL IN 8 OUNCES OF JUICE OR WATER AND DRINK ONCE DAILY. (Patient taking differently: Take 17 g by mouth daily.) 527 g 5   rosuvastatin (CRESTOR) 10 MG tablet TAKE 1 TABLET BY MOUTH ONCE A DAY. 90 tablet 0   spironolactone (ALDACTONE) 25 MG tablet TAKE 1 TABLET BY MOUTH DAILY 30 tablet 0   SYMBICORT 160-4.5 MCG/ACT inhaler INHALE 2 PUFFS INTO THE LUNGS TWICE A DAY 10.2 g 0   traMADol (ULTRAM) 50 MG tablet One tablet every eight hours as needed for pain. 60 tablet 3   No current facility-administered medications for this visit.    ALLERGIES:  Allergies  Allergen Reactions   Penicillins Hives, Shortness Of Breath and Swelling    Has patient had a PCN reaction causing immediate rash, facial/tongue/throat swelling, SOB or lightheadedness with  hypotension: yes Has patient had a PCN reaction causing severe rash involving mucus membranes or skin necrosis:no Has patient had a PCN reaction that required hospitalization: yes Has patient had a PCN reaction occurring within the last 10 years: no If all of the above answers are "NO", then may proceed with Cephalosporin use. Tolerates rocephin/keflex   Sulfonamide Derivatives Other (See Comments)    Shaking all over, seizure like symptoms. Hospitalization resulted    Avapro [Irbesartan] Other (See Comments)    Pt associates avapro with  bruising though aware scientifically not the case, wants to chanmge med   Levaquin [Levofloxacin] Itching    LABORATORY DATA:  I have reviewed the labs as listed.     Latest Ref Rng & Units 08/16/2022    1:57 PM 08/15/2022   11:36 AM 04/17/2022    4:44 AM  CBC  WBC 4.0 - 10.5 K/uL 11.0  11.4  18.6   Hemoglobin 12.0 - 15.0 g/dL 60.4  54.0  98.1   Hematocrit 36.0 - 46.0 % 44.1  45.2  48.4   Platelets 150 - 400 K/uL 346  337  428       Latest Ref Rng & Units 08/16/2022    1:57 PM 08/15/2022   11:36 AM 04/17/2022   11:20 AM  CMP  Glucose 70 - 99 mg/dL 95  191  478   BUN 8 - 23 mg/dL 21  21  45   Creatinine 0.44 - 1.00 mg/dL 2.95  6.21  3.08   Sodium 135 - 145 mmol/L 138  140  134   Potassium 3.5 - 5.1 mmol/L 4.8  4.9  5.3   Chloride 98 - 111 mmol/L 101  100  100   CO2 22 - 32 mmol/L 27  28  26    Calcium 8.9 - 10.3 mg/dL 9.5  9.6  65.7   Total Protein 6.5 - 8.1 g/dL 7.4     Total Bilirubin 0.3 - 1.2 mg/dL 0.6     Alkaline Phos 38 - 126 U/L 63     AST 15 - 41 U/L 16     ALT 0 - 44 U/L 17       DIAGNOSTIC IMAGING:  I have independently reviewed the scans and discussed with the patient. DG Knee 3 Views Right  Result Date: 10/02/2022 Clinical:  fall yesterday on right knee, painful motion, bruising X-rays were done of the right knee, three views. Alignment of the right knee is good, no fracture noted.  No fracture or loose body noted.  Bone quality is good. There is slight effusion present. Impression:  slight effusion of right knee, no fracture noted. Electronically Signed Darreld Mclean, MD 9/17/20242:52 PM    WRAP UP:  All questions were answered. The patient knows to call the clinic with any problems, questions or concerns.  Medical decision making: ***  Time spent on visit: I spent {CHL ONC TIME VISIT - QIONG:[id number]} counseling the patient face to face. The total time spent in the appointment was {CHL ONC TIME VISIT - MWNUU:[id number]} and more than 50% was on  counseling.  Carnella Guadalajara, PA-C  ***

## 2022-10-08 ENCOUNTER — Inpatient Hospital Stay: Payer: 59

## 2022-10-08 ENCOUNTER — Inpatient Hospital Stay: Payer: 59 | Attending: Physician Assistant | Admitting: Physician Assistant

## 2022-10-08 VITALS — BP 138/71 | HR 77 | Temp 98.2°F | Resp 18 | Wt 177.8 lb

## 2022-10-08 DIAGNOSIS — Z23 Encounter for immunization: Secondary | ICD-10-CM | POA: Diagnosis not present

## 2022-10-08 DIAGNOSIS — Z1231 Encounter for screening mammogram for malignant neoplasm of breast: Secondary | ICD-10-CM | POA: Diagnosis not present

## 2022-10-08 DIAGNOSIS — E559 Vitamin D deficiency, unspecified: Secondary | ICD-10-CM | POA: Diagnosis not present

## 2022-10-08 DIAGNOSIS — Z170 Estrogen receptor positive status [ER+]: Secondary | ICD-10-CM | POA: Diagnosis not present

## 2022-10-08 DIAGNOSIS — Z87891 Personal history of nicotine dependence: Secondary | ICD-10-CM | POA: Diagnosis not present

## 2022-10-08 DIAGNOSIS — C50212 Malignant neoplasm of upper-inner quadrant of left female breast: Secondary | ICD-10-CM | POA: Diagnosis not present

## 2022-10-08 DIAGNOSIS — D75839 Thrombocytosis, unspecified: Secondary | ICD-10-CM

## 2022-10-08 DIAGNOSIS — Z122 Encounter for screening for malignant neoplasm of respiratory organs: Secondary | ICD-10-CM

## 2022-10-08 DIAGNOSIS — E611 Iron deficiency: Secondary | ICD-10-CM

## 2022-10-08 MED ORDER — INFLUENZA VAC A&B SURF ANT ADJ 0.5 ML IM SUSY
0.5000 mL | PREFILLED_SYRINGE | Freq: Once | INTRAMUSCULAR | Status: AC
  Administered 2022-10-08: 0.5 mL via INTRAMUSCULAR
  Filled 2022-10-08: qty 0.5

## 2022-10-08 NOTE — Progress Notes (Signed)
[name] presents today for injection per the provider's orders.  Flu shot administration without incident; injection site WNL; see MAR for injection details. Patient tolerated procedure well and without incident.  No questions or complaints noted at this time.

## 2022-10-08 NOTE — Patient Instructions (Addendum)
[facility] at [facility] **VISIT SUMMARY & IMPORTANT INSTRUCTIONS **   You were seen today by Rojelio Brenner PA-C for your history of breast cancer.    HISTORY OF BREAST CANCER Your labs and physical exam did not show any evidence of returning breast cancer at this time. You are due for your annual mammogram.  IRON DEFICIENCY Your iron levels are low. Start taking iron tablet once daily. Take over-the-counter ferrous sulfate 325 mg once daily. Take your iron pill with a glass of orange juice to help your body absorb it better. If you experience any constipation from your iron tablet, try taking over the stool softener such as Colace once daily.  Drink plenty of water.  Eat a high fiber diet and consider taking a fiber supplement. If you continue to have severe side effects such as constipation or upset stomach, you can decrease your iron to every other day instead. If you are still having severe side effects at lower dose, please stop taking your iron tablet and call our office to let us know.    OTHER ISSUES: You will be due for your annual lung cancer screening (due to smoking history) in March 2025. Your white blood cells are mildly elevated, which is likely due to your steroid inhaler (Symbicort).  FOLLOW-UP APPOINTMENT: Labs and office visit in 1 year  ** Thank you for trusting me with your healthcare!  I strive to provide all of my patients with quality care at each visit.  If you receive a survey for this visit, I would be so grateful to you for taking the time to provide feedback.  Thank you in advance!  ~ Chaney Ingram                   Dr. Doreatha Massed   &   Rojelio Brenner, PA-C   - - - - - - - - - - - - - - - - - -    Thank you for choosing [facility] at [facility] to provide your oncology and hematology care.  To afford each patient quality time with our provider, please arrive at least 15 minutes before your  scheduled appointment time.   If you have a lab appointment with the Cancer Center please come in thru the Main Entrance and check in at the main information desk.  You need to re-schedule your appointment should you arrive 10 or more minutes late.  We strive to give you quality time with our providers, and arriving late affects you and other patients whose appointments are after yours.  Also, if you no show three or more times for appointments you may be dismissed from the clinic at the providers discretion.     Again, thank you for choosing [facility].  Our hope is that these requests will decrease the amount of time that you wait before being seen by our physicians.       _____________________________________________________________  Should you have questions after your visit to [facility], please contact our office at [phone] and follow the prompts.  Our office hours are 8:00 a.m. and 4:30 p.m. Monday - Friday.  Please note that voicemails left after 4:00 p.m. may not be returned until the following business day.  We are closed weekends and major holidays.  You do have access to a nurse 24-7, just call the main number to the clinic [phone] and do not press any  options, hold on the line and a nurse will answer the phone.    For prescription refill requests, have your pharmacy contact our office and allow 72 hours.

## 2022-10-09 ENCOUNTER — Encounter: Payer: Self-pay | Admitting: Orthopaedic Surgery

## 2022-10-09 ENCOUNTER — Ambulatory Visit (INDEPENDENT_AMBULATORY_CARE_PROVIDER_SITE_OTHER): Payer: 59 | Admitting: Orthopaedic Surgery

## 2022-10-09 ENCOUNTER — Other Ambulatory Visit: Payer: Self-pay | Admitting: Family Medicine

## 2022-10-09 VITALS — BP 135/70 | HR 70 | Ht 65.0 in | Wt 177.0 lb

## 2022-10-09 DIAGNOSIS — G8929 Other chronic pain: Secondary | ICD-10-CM

## 2022-10-09 DIAGNOSIS — M25561 Pain in right knee: Secondary | ICD-10-CM | POA: Diagnosis not present

## 2022-10-09 DIAGNOSIS — Z7901 Long term (current) use of anticoagulants: Secondary | ICD-10-CM

## 2022-10-09 NOTE — Progress Notes (Signed)
I am better.  She has much less pain of the right knee today and her bruising is resolving.  She is on Eliquis.  Right knee has just slight effusion, crepitus, ROM 0 to 105, stable, NV intact. She is in a wheelchair and prefers not to stand.  Encounter Diagnoses  Name Primary?   Chronic pain of right knee Yes   Long term (current) use of anticoagulants    Continue as she is doing, gradually increase activity with help.  Return in one month.  Call if any problem.  Precautions discussed.  Electronically Signed Darreld Mclean, MD 9/24/20242:16 PM

## 2022-10-10 NOTE — Progress Notes (Unsigned)
[name], female    DOB: [birth year]    MRN: [id number]   Brief patient profile:  72 yowf quit smoking 04/09/22  with new onset doe around 2014 dx as copd (though PFTs 03/03/12 did not confirm) and referred to pulmonary clinic in [facility]  11/19/2019 by Dr    Lodema Hong for refractory cough and sob  History of Present Illness  11/19/2019  Pulmonary/ 1st office eval/ [name] / [place] [facility] maint on symbicort 160/atrovent Chief Complaint  Patient presents with   Consult    sinus congestion for about a 1-2 weeks  Dyspnea:  Goal mb and back 100 ft /  Variably sob shopping / not using [facility] parking  Cough: sporadic  Sleep: fine / some worse cough in am SABA use: not clear  rec Benazapril 40 mg  > stop this and replace with ibesartan 300 mg one daily in its place  Plan A = Automatic = Always=    symbicort 160 Take 2 puffs first thing in am and then another 2 puffs about 12 hours later.  Work on inhaler technique:   Plan B = Backup (to supplement plan A, not to replace it) Only use your albuterol inhaler as a rescue medication  Plan C = Crisis (instead of Plan B but only if Plan B stops working) - only use your albuterol nebulizer if you first try Plan B and it fails to help  The key is to stop smoking completely before smoking completely stops you! Please schedule a follow up office visit in 6 weeks, call sooner if needed with all medications     12/29/2019  f/u ov/[place] office/[name] re: doe/ brown mucus x one week already took medrol dose pack p televisit with pcp  Chief Complaint  Patient presents with   Follow-up    Still bothered by sinus congestion. She is c/o cough with brown sputum over the past wk. She is using the albuterol inhaler about 3 x per wk.   Asthmatic bronchitis, chronic (HCC) Active smoker  - PFT's  03/03/2012  FEV1 1.69 (69 % ) ratio 0.72  p 5 % improvement from saba p ? prior to study with DLCO  17.42 (68%) corrects to 4.53 (92%)  for alv volume and FV  curve min curvature/ ERV 37%   - try off acei 11/19/2019 and return for medication reconciliation in 6 weeks  Hypertension goal BP (blood pressure) < 130/80  Dyspnea:  Better though very inactive Cough:  X one week worse as above   SABA use: very poor insight into ABC plan, could not articulate any part of it even with prompting  02: none  rec Continue Irbesartan 300 mg one daily plus add  hydrodiuril (hydrochlorothiazide) 25 mg one daily  zpak should changes mucus brown to clear  The key is to stop smoking completely before smoking completely stops you! For cough > mucinex 1200 mg every 12 hours as needed (over the counter) Plan A = Automatic = Always=    symbicort 160 Take 2 puffs first thing in am and then another 2 puffs about 12 hours later atrovent is 2 puffs four times daily  Work on inhaler technique:  Plan B = Backup (to supplement plan A, not to replace it) Only use your albuterol inhaler as a rescue medication to be used if you can't catch your breath  Plan C = Crisis (instead of Plan B but only if Plan B stops working) - only use your albuterol nebulizer if  you first try Plan B and it fails to help > ok to use the nebulizer up to every 4 hours but if start needing it regularly call for immediate appointment Please schedule a follow up office visit in 6 weeks with pfts on return      07/19/2021  f/u ov/[place] office/[name] re: AB maint on symbicort 160 2bid   Chief Complaint  Patient presents with   Follow-up    Doing well. No new concerns    Dyspnea:  MB and back easier / no real steps  Cough: still smoker's cough worse in ams Sleeping: does fine bed is flat with pillows  SABA use: none now  02: none Rec Plan A = Automatic = Always=    Symbicort 160 Take 2 puffs first thing in am and then another 2 puffs about 12 hours later.  Work on inhaler technique:  Plan B = Backup (to supplement plan A, not to replace it) Only use your albuterol inhaler as a rescue medication   Plan C = Crisis (instead of Plan B but only if Plan B stops working) - only use your albuterol nebulizer if you first try Plan B  PFTs prior to next office visit  The key is to stop smoking completely before smoking completely stops you! Please schedule a follow up visit in 6 months but call sooner if needed  with all medications /inhalers/ solutions in hand    01/18/2022  f/u ov/[place] office/[name] re: GOLD 3/ AB  maint on symb 160 Chief Complaint  Patient presents with   Follow-up    Breathing doing ok   Dyspnea:  no longer doing mb due to L leg pain  Cough: better but still smoker's rattle worse in am/ slt creamy  Sleeping: flat bed with pillows  SABA use: very confused with instructions 02: none  Covid status: up to date  Lung cancer screening: in program  Rec Plan A = Automatic = Always=    Symbicort 160 Take 2 puffs first thing in am and then another 2 puffs about 12 hours later.  Work on inhaler technique:   Please schedule a follow up visit in 3 months but call sooner if needed      07/25/2022 post hosp  f/u ov/[place] office/[name] re: COPD 3/ AB/CHF  maint on symbicort / atrovent Chief Complaint  Patient presents with   Follow-up    Breathing is overall doing well.  Dyspnea:  w/c bound  for ov's/ able to walk at home s walker and "[place] a month trip to [place]" but mostly house bound   Cough: no Sleeping: hosp bed x 30 degrees  SABA use: none 02: 2lpm 24/7  Rec My office will be contacting you by phone for referral to [facility] for best fit for ambulatory 02 ? POC eligible     Best cough medication =  mucinex or mucinex dm up to 1200 mg every 12 hours as needed    Please schedule a follow up visit in 3 months but call sooner if needed - bring inhalers      10/11/2022  f/u ov/[place] office/[name] re: *** maint on *** did *** bring inhalers  No chief complaint on file.   Dyspnea:  *** Cough: *** Sleeping: ***   resp cc  SABA use: *** 02: ***  Lung cancer  screening: ***   No obvious day to day or daytime variability or assoc excess/ purulent sputum or mucus plugs or hemoptysis or cp or chest tightness,  subjective wheeze or overt sinus or hb symptoms.    Also denies any obvious fluctuation of symptoms with weather or environmental changes or other aggravating or alleviating factors except as outlined above   No unusual exposure hx or h/o childhood pna/ asthma or knowledge of premature birth.  Current Allergies, Complete Past Medical History, Past Surgical History, Family History, and Social History were reviewed in [employer] record.  ROS  The following are not active complaints unless bolded Hoarseness, sore throat, dysphagia, dental problems, itching, sneezing,  nasal congestion or discharge of excess mucus or purulent secretions, ear ache,   fever, chills, sweats, unintended wt loss or wt gain, classically pleuritic or exertional cp,  orthopnea pnd or arm/hand swelling  or leg swelling, presyncope, palpitations, abdominal pain, anorexia, nausea, vomiting, diarrhea  or change in bowel habits or change in bladder habits, change in stools or change in urine, dysuria, hematuria,  rash, arthralgias, visual complaints, headache, numbness, weakness or ataxia or problems with walking or coordination,  change in mood or  memory.        No outpatient medications have been marked as taking for the 10/11/22 encounter (Appointment) with Nyoka Cowden, MD.                  Past Medical History:  Diagnosis Date   Breast cancer [facility]) 2007   Stage I (T1b N0 M0), grade 1 well-differentiated carcinoma of the left breast status, post lumpectomy followed by radiation therapy. Her estrogen receptor receptors were 93%, progesterone receptors 67%. HER-2/neu was negative. No lymphovascular space invasion was seen. All margins were clear. Ki-67 marker was low at 1% with surgery on 11/15/2004. Treated then with post-lumpectomy radiation,  finish   Breast cancer, left breast (HCC) 2007   COPD (chronic obstructive pulmonary disease) (HCC)    Diabetes mellitus without complication (HCC)    Diverticula of colon    Hx of rickettsial disease    Hyperlipidemia    Hypertension    Kidney stones    Nicotine dependence    Osteoarthritis        Objective:    Wts  10/11/2022         ***  07/25/2022        160  07/19/2021          169  12/30/2020     170 11/16/2020       174     04/04/2020       174  12/29/19 179 lb (81.2 kg)  12/04/19 177 lb (80.3 kg)  11/19/19 177 lb 3.2 oz (80.4 kg)     Vital signs reviewed  10/11/2022  - Note at rest 02 sats  ***% on ***   General appearance:    ***  Mild barre***        Assessment

## 2022-10-11 ENCOUNTER — Encounter: Payer: Self-pay | Admitting: Internal Medicine

## 2022-10-11 ENCOUNTER — Ambulatory Visit: Payer: 59 | Admitting: Internal Medicine

## 2022-10-11 VITALS — BP 148/65 | HR 63 | Ht 65.0 in | Wt 179.0 lb

## 2022-10-11 DIAGNOSIS — H52223 Regular astigmatism, bilateral: Secondary | ICD-10-CM | POA: Diagnosis not present

## 2022-10-11 DIAGNOSIS — J9611 Chronic respiratory failure with hypoxia: Secondary | ICD-10-CM

## 2022-10-11 DIAGNOSIS — H01001 Unspecified blepharitis right upper eyelid: Secondary | ICD-10-CM | POA: Diagnosis not present

## 2022-10-11 DIAGNOSIS — H02834 Dermatochalasis of left upper eyelid: Secondary | ICD-10-CM | POA: Diagnosis not present

## 2022-10-11 DIAGNOSIS — H01005 Unspecified blepharitis left lower eyelid: Secondary | ICD-10-CM | POA: Diagnosis not present

## 2022-10-11 DIAGNOSIS — H01004 Unspecified blepharitis left upper eyelid: Secondary | ICD-10-CM | POA: Diagnosis not present

## 2022-10-11 DIAGNOSIS — J449 Chronic obstructive pulmonary disease, unspecified: Secondary | ICD-10-CM | POA: Diagnosis not present

## 2022-10-11 DIAGNOSIS — H26493 Other secondary cataract, bilateral: Secondary | ICD-10-CM | POA: Diagnosis not present

## 2022-10-11 DIAGNOSIS — H02831 Dermatochalasis of right upper eyelid: Secondary | ICD-10-CM | POA: Diagnosis not present

## 2022-10-11 DIAGNOSIS — H47012 Ischemic optic neuropathy, left eye: Secondary | ICD-10-CM | POA: Diagnosis not present

## 2022-10-11 DIAGNOSIS — H5442A5 Blindness left eye category 5, normal vision right eye: Secondary | ICD-10-CM | POA: Diagnosis not present

## 2022-10-11 DIAGNOSIS — H01002 Unspecified blepharitis right lower eyelid: Secondary | ICD-10-CM | POA: Diagnosis not present

## 2022-10-11 DIAGNOSIS — H11041 Peripheral pterygium, stationary, right eye: Secondary | ICD-10-CM | POA: Diagnosis not present

## 2022-10-11 DIAGNOSIS — Z961 Presence of intraocular lens: Secondary | ICD-10-CM | POA: Diagnosis not present

## 2022-10-11 NOTE — Addendum Note (Signed)
Addended by: Lajoyce Lauber A on: 10/11/2022 03:35 PM   Modules accepted: Orders

## 2022-10-11 NOTE — Assessment & Plan Note (Addendum)
Referred to [facility] for best fir for portable 02 - 10/11/2022   Walked on 2lpm cont   x  2  lap(s) =  approx 300  ft  @ slow pace  with lowest 02 sats 93% and min sob  Advised: Make sure you check your oxygen saturation  AT  your highest level of activity (not after you stop)   to be sure it stays over 90% and adjust  02 flow upward to maintain this level if needed but remember to turn it back to previous settings when you stop (to conserve your supply).   Will try again to refer for best fit eval    F/u q 6 m, sooner prn   Each maintenance medication was reviewed in detail including emphasizing most importantly the difference between maintenance and prns and under what circumstances the prns are to be triggered using an action plan format where appropriate.  Total time for H and P, chart review, counseling, reviewing hfa/neb/02 /pulse ox device(s) , directly observing portions of ambulatory 02 saturation study/ and generating customized AVS unique to this office visit / same day charting > 

## 2022-10-11 NOTE — Assessment & Plan Note (Addendum)
Active smoker  - PFT's  03/03/2012  FEV1 1.69 (69 % ) ratio 0.72  p 5 % improvement from saba p ? prior to study with DLCO  17.42 (68%) corrects to 4.53 (92%)  for alv volume and FV curve min curvature/ ERV 37%   - try off acei 11/19/2019 and return for medication reconciliation in 6 weeks - 12/30/2020  After extensive coaching inhaler device,  effectiveness =    75% from a baseline of 50%  - 07/19/2021  After extensive coaching inhaler device,  effectiveness =    75% (short Ti)  - PFT's  09/26/21  FEV1 0.97 (44 % ) ratio 0.54  p 5 % improvement from saba p ? prior to study with DLCO  12.37 (62%)   and FV curve not typical for copd/ air trapping by lung volumes   - 10/11/2022  After extensive coaching inhaler device,  effectiveness =    50% hfa and also using atrovent so consider change to stiolto or just spiriva if tremor no improving on lower doses of symbicort but really needs much better hfa technique with other consideration = neb yupelri   Re SABA :  I spent extra time with pt today reviewing appropriate use of albuterol for prn use on exertion with the following points: 1) saba is for relief of sob that does not improve by walking a slower pace or resting but rather if the pt does not improve after trying this first. 2) If the pt is convinced, as many are, that saba helps recover from activity faster then it's easy to tell if this is the case by re-challenging : ie stop, take the inhaler, then p 5 minutes try the exact same activity (intensity of workload) that just caused the symptoms and see if they are substantially diminished or not after saba 3) if there is an activity that reproducibly causes the symptoms, try the saba 15 min before the activity on alternate days   If in fact the saba really does help, then fine to continue to use it prn but advised may need to look closer at the maintenance regimen being used to achieve better control of airways disease with exertion.

## 2022-10-11 NOTE — Patient Instructions (Signed)
Make sure you check your oxygen saturation  AT  your highest level of activity (not after you stop)   to be sure it stays over 90% and adjust  02 flow upward to maintain this level if needed but remember to turn it back to previous settings when you stop (to conserve your supply).   Ok to try reduce your symbicort to one puff twice daily and if feel the same can just to one puff each am as long as doing great - back to 2 every 12 hours     Please schedule a follow up visit in 6  months but call sooner if needed

## 2022-10-13 ENCOUNTER — Other Ambulatory Visit: Payer: Self-pay | Admitting: Internal Medicine

## 2022-10-13 ENCOUNTER — Other Ambulatory Visit: Payer: Self-pay | Admitting: Family Medicine

## 2022-10-13 DIAGNOSIS — J4489 Other specified chronic obstructive pulmonary disease: Secondary | ICD-10-CM

## 2022-10-13 DIAGNOSIS — R918 Other nonspecific abnormal finding of lung field: Secondary | ICD-10-CM

## 2022-10-15 ENCOUNTER — Ambulatory Visit (HOSPITAL_COMMUNITY)
Admission: RE | Admit: 2022-10-15 | Discharge: 2022-10-15 | Disposition: A | Discharge status: Home / Self Care | Payer: 59 | Source: Ambulatory Visit | Attending: Physician Assistant | Admitting: Physician Assistant

## 2022-10-15 ENCOUNTER — Encounter (HOSPITAL_COMMUNITY): Payer: Self-pay

## 2022-10-15 DIAGNOSIS — Z170 Estrogen receptor positive status [ER+]: Secondary | ICD-10-CM

## 2022-10-15 DIAGNOSIS — C50212 Malignant neoplasm of upper-inner quadrant of left female breast: Secondary | ICD-10-CM | POA: Insufficient documentation

## 2022-10-15 DIAGNOSIS — Z1231 Encounter for screening mammogram for malignant neoplasm of breast: Secondary | ICD-10-CM | POA: Diagnosis not present

## 2022-10-15 DIAGNOSIS — Z853 Personal history of malignant neoplasm of breast: Secondary | ICD-10-CM | POA: Diagnosis not present

## 2022-10-19 ENCOUNTER — Other Ambulatory Visit: Payer: Self-pay | Admitting: Family Medicine

## 2022-10-26 DIAGNOSIS — I5021 Acute systolic (congestive) heart failure: Secondary | ICD-10-CM | POA: Diagnosis not present

## 2022-10-26 DIAGNOSIS — J449 Chronic obstructive pulmonary disease, unspecified: Secondary | ICD-10-CM | POA: Diagnosis not present

## 2022-10-29 ENCOUNTER — Telehealth: Payer: Self-pay | Admitting: Orthopaedic Surgery

## 2022-11-05 ENCOUNTER — Ambulatory Visit
Admission: RE | Admit: 2022-11-05 | Discharge: 2022-11-05 | Disposition: A | Discharge status: Home / Self Care | Payer: 59 | Source: Ambulatory Visit | Attending: Nurse Practitioner | Admitting: Nurse Practitioner

## 2022-11-05 VITALS — BP 144/90 | HR 69 | Temp 98.7°F | Resp 20

## 2022-11-05 DIAGNOSIS — B349 Viral infection, unspecified: Secondary | ICD-10-CM | POA: Diagnosis not present

## 2022-11-05 DIAGNOSIS — Z1152 Encounter for screening for COVID-19: Secondary | ICD-10-CM | POA: Diagnosis not present

## 2022-11-05 DIAGNOSIS — J029 Acute pharyngitis, unspecified: Secondary | ICD-10-CM | POA: Insufficient documentation

## 2022-11-05 LAB — POCT RAPID STREP A (OFFICE): Rapid Strep A Screen: NEGATIVE

## 2022-11-05 MED ORDER — NYSTATIN 100000 UNIT/ML MT SUSP: 5.0000 mL | Freq: Four times a day (QID) | OROMUCOSAL | 0 refills | Status: AC | PRN

## 2022-11-05 NOTE — ED Provider Notes (Signed)
[facility]    CSN: [id number] Arrival date & time: 11/05/22  1745      History   Chief Complaint Chief Complaint  Patient presents with   Sore Throat    Entered by patient    HPI [name] is a 77 y.o. female.   The history is provided by the patient.   Patient presents for complaints of sore throat and bodyaches that started approximately 2 days ago.  Patient denies fever, headache, ear pain, ear drainage, chest pain, abdominal pain, nausea, vomiting, or diarrhea.  Patient reports that she has pain in the left side of her neck when she turns her head.  She also states that she has had more postnasal drainage along with sinus pain and nasal congestion.  Patient reports she has been using fluticasone for her symptoms.  She denies any obvious known sick contacts.  Past Medical History:  Diagnosis Date   Aortic atherosclerosis (HCC)    Breast cancer (HCC) 2007   Stage I (T1b N0 M0), grade 1 well-differentiated carcinoma of the left breast status, post lumpectomy followed by radiation therapy. Her estrogen receptor receptors were 93%, progesterone receptors 67%. HER-2/neu was negative. No lymphovascular space invasion was seen. All margins were clear. Ki-67 marker was low at 1% with surgery on 11/15/2004. Treated then with post-lumpectomy radiation, finish   Breast cancer, left breast (HCC) 2007   COPD (chronic obstructive pulmonary disease) (HCC)    Coronary artery calcification seen on CAT scan    Diabetes mellitus without complication (HCC)    diet controlled   Diverticula of colon    Hx of rickettsial disease    Hyperlipidemia    Hypertension    Kidney stones    Nicotine dependence    Osteoarthritis     Patient Active Problem List   Diagnosis Date Noted   HFrEF (heart failure with reduced ejection fraction) (HCC) 07/30/2022   Atrial fibrillation (HCC) 07/30/2022   Chronic respiratory failure with hypoxia (HCC) 07/25/2022   Abnormal CXR 05/31/2022    Tremor 05/31/2022   COPD with acute exacerbation (HCC) 04/07/2022   Acute HFrEF/systolic dysfunction CHF with EF 35 to 40% and regional wall motion normalities 6 04/07/2022   Acute respiratory failure with hypoxia (HCC) 04/06/2022   New onset a-fib (HCC) 04/06/2022   Long term (current) use of anticoagulants 01/15/2022   Long term (current) use of inhaled steroids 01/15/2022   Oxygen dependent 01/15/2022   Encounter for annual general medical examination with abnormal findings in adult 12/03/2021   Back pain with left-sided radiculopathy 11/13/2021   Cough 11/13/2021   Head trauma 06/13/2021   Skin lesion of left arm 06/01/2021   Urinary frequency 05/18/2021   COVID-19 virus infection 09/30/2020   Thrombocytosis 08/25/2020   Personal history of tobacco use, presenting hazards to health 08/25/2020   Hx of breast cancer 08/25/2020   Low vitamin B12 level 08/22/2020   History of adenomatous polyp of colon 05/23/2020   Aortic atherosclerosis (HCC) 02/16/2020   COPD GOLD 3/ AB 11/19/2019   Cigarette smoker 11/19/2019   Prediabetes 06/23/2019   Muscle spasm 09/14/2018   Coronary artery calcification seen on CAT scan 11/09/2017   Colon adenomas 11/07/2017   Bloating symptom 11/07/2017   Allergic rhinitis 01/04/2015   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HCC) 09/26/2014   Hydrosalpinx 09/28/2013   Osteopenia 07/15/2013   postmenopausal ovarian cyst 04/06/2013   Pulmonary nodules 04/04/2012   Breast cancer (HCC) 03/11/2012   Ganglion cyst 05/08/2011   COPD (chronic  obstructive pulmonary disease) with chronic bronchitis (HCC) 07/24/2009   Hyperlipidemia LDL goal <100 03/09/2009   CONSTIPATION, CHRONIC 03/09/2009   HTN (hypertension) 09/29/2008   GENERALIZED OSTEOARTHROSIS UNSPECIFIED SITE 09/29/2008    Past Surgical History:  Procedure Laterality Date   ABDOMINAL HYSTERECTOMY     BREAST SURGERY  2005 approx   left lumpectomy    CATARACT EXTRACTION W/PHACO Left 05/01/2019   Procedure: CATARACT  EXTRACTION PHACO AND INTRAOCULAR LENS PLACEMENT LEFT EYE CDE=11.73;  Surgeon: Fabio Pierce, MD;  Location: [facility];  Service: Ophthalmology;  Laterality: Left;  left   CATARACT EXTRACTION W/PHACO Right 05/15/2019   Procedure: CATARACT EXTRACTION PHACO AND INTRAOCULAR LENS PLACEMENT RIGHT EYE;  Surgeon: Fabio Pierce, MD;  Location: [facility];  Service: Ophthalmology;  Laterality: Right;  CDE: 16.07   CHOLECYSTECTOMY N/A 05/26/2014   Procedure: LAPAROSCOPIC CHOLECYSTECTOMY;  Surgeon: Franky Macho Md, MD;  Location: [facility];  Service: General;  Laterality: N/A;   COLECTOMY     2005, diverticulitis   COLONOSCOPY N/A 05/20/2017    Surgeon: West Bali, MD;  five 3-6 mm polyps (4 tubular adenomas, 1 hyperplastic polyp), tortuous left colon, internal hemorrhoids.  Recommended repeat colonoscopy in 3 years.   COLONOSCOPY WITH PROPOFOL N/A 07/12/2020   Procedure: COLONOSCOPY WITH PROPOFOL;  Surgeon: Lanelle Bal, DO;  Location: [facility];  Service: Endoscopy;  Laterality: N/A;  12:30pm   DILATION AND CURETTAGE OF UTERUS     ESOPHAGOGASTRODUODENOSCOPY N/A 05/20/2017   Surgeon: West Bali, MD; normal esophagus and mild gastritis due to aspirin.  Biopsies negative for H. pylori.   left breast      cancer, in 2006   POLYPECTOMY  07/12/2020   Procedure: POLYPECTOMY;  Surgeon: Lanelle Bal, DO;  Location: [facility];  Service: Endoscopy;;   TUBAL LIGATION      OB History     Gravida  8   Para  5   Term  5   Preterm  0   AB  3   Living         SAB  3   IAB  0   Ectopic  0   Multiple      Live Births               Home Medications    Prior to Admission medications   Medication Sig Start Date End Date Taking? Authorizing Provider  acetaminophen (TYLENOL) 325 MG tablet Take 650 mg by mouth every 6 (six) hours as needed for moderate pain.    Yes [provider]  albuterol (PROVENTIL) (2.5 MG/3ML) 0.083% nebulizer solution INHALE ONE VIAL VIA NEBULIZER  THREE TIMES DAILY. Patient taking differently: Take 2.5 mg by nebulization 3 (three) times daily as needed for wheezing or shortness of breath. 07/08/18  Yes Freddy Finner, NP  albuterol (VENTOLIN HFA) 108 (90 Base) MCG/ACT inhaler INHALE (2) PUFFS EVERY FOURHOURS AS NEEDED ONLY IF YOU CANT CATCH YOUR BREATH Patient taking differently: Inhale 2 puffs into the lungs every 4 (four) hours as needed for wheezing or shortness of breath. INHALE (2) PUFFS EVERY FOURHOURS AS NEEDED ONLY IF YOU CANT CATCH YOUR BREATH 05/12/21  Yes Anabel Halon, MD  amiodarone (PACERONE) 200 MG tablet Take 0.5 tablets (100 mg total) by mouth daily. 07/05/22  Yes Jonelle Sidle, MD  amLODipine (NORVASC) 5 MG tablet TAKE (1) TABLET BY MOUOTH ONCE DAILY. 10/22/22  Yes Kerri Perches, MD  apixaban (ELIQUIS) 5 MG TABS tablet Take  1 tablet (5 mg total) by mouth 2 (two) times daily. 07/25/22 01/21/23 Yes Kerri Perches, MD  ATROVENT HFA 17 MCG/ACT inhaler INHALE 2 PUFFS BY MOUTH FOUR TIMES A DAY. 09/18/22  Yes Kerri Perches, MD  bisoprolol (ZEBETA) 5 MG tablet TAKE ONE TABLET BY MOUTH EVERY DAY 09/24/22  Yes Kerri Perches, MD  Calcium Carbonate-Vitamin D (CALCIUM 600 + D PO) Take 1 tablet by mouth 2 (two) times daily.   Yes [provider]  Cyanocobalamin (VITAMIN B 12) 500 MCG TABS Take 500 mcg by mouth daily. Take one tablet by mouth once daily 08/18/20  Yes Kerri Perches, MD  ENTRESTO 24-26 MG TAKE ONE TABLET BY MOUTH 2 TIMES A DAY 09/24/22  Yes Kerri Perches, MD  FARXIGA 10 MG TABS tablet TAKE 1 TABLET BY MOUTH DAILY 09/24/22  Yes Kerri Perches, MD  fluticasone (FLONASE) 50 MCG/ACT nasal spray PLACE 2 SPRAYS IN EACH NOSTRIL DAILY. 10/15/22  Yes Kerri Perches, MD  magic mouthwash (nystatin, hydrocortisone, diphenhydrAMINE, lidocaine) suspension Swish and spit 5 mLs 4 (four) times daily as needed for up to 7 days for mouth pain. 11/05/22 11/12/22 Yes Leath-Warren, Sadie Haber, NP   Naphazoline HCl (CLEAR EYES OP) Place 1 drop into both eyes daily as needed (irritation).   Yes [provider]  pantoprazole (PROTONIX) 40 MG tablet TAKE ONE TABLET BY MOUTH ONCE DAILY BEFORE BREAKFAST. 10/13/22  Yes Wert, Charlaine Dalton, MD  polyethylene glycol powder (GLYCOLAX/MIRALAX) powder MIX 1 CAPFUL IN 8 OUNCES OF JUICE OR WATER AND DRINK ONCE DAILY. Patient taking differently: Take 17 g by mouth daily. 04/23/16  Yes Kerri Perches, MD  rosuvastatin (CRESTOR) 10 MG tablet TAKE 1 TABLET BY MOUTH ONCE A DAY. 09/18/22  Yes Kerri Perches, MD  spironolactone (ALDACTONE) 25 MG tablet TAKE 1 TABLET BY MOUTH DAILY 10/09/22  Yes Kerri Perches, MD  SYMBICORT 160-4.5 MCG/ACT inhaler INHALE 2 PUFFS INTO THE LUNGS TWICE A DAY 10/15/22  Yes Kerri Perches, MD  traMADol (ULTRAM) 50 MG tablet TAKE (1) TABLET BY MOUTH EVERY 8 HOURS AS NEEDED. 10/29/22   Darreld Mclean, MD  cyclobenzaprine (FLEXERIL) 5 MG tablet Take 1 tablet (5 mg total) by mouth at bedtime. 05/24/22   Kerri Perches, MD  hydrOXYzine (ATARAX) 25 MG tablet Take 1 tablet (25 mg total) by mouth 2 (two) times daily as needed for itching. 08/08/22   [place] Ouch, Lennart Pall, PA-C  Misc. Devices MISC Please provide patient with mastectomy bra and prosthesis. Dx: History of left breast cancer 08/25/20   Doreatha Massed, MD  nicotine (NICODERM CQ - DOSED IN MG/24 HR) 7 mg/24hr patch APPLY 1 PATCH ONTO THE SKIN ONCE DAILY 05/21/22   Kerri Perches, MD    Family History Family History  Problem Relation Age of Onset   Breast cancer Mother    COPD Father    Emphysema Father    Throat cancer Sister    Glaucoma Brother    Schizophrenia Brother    Heart attack Brother    Lung cancer Sister        former smoker   Hepatitis C Daughter    Seizures Daughter    SIDS Son    Colon cancer Neg Hx    Colon polyps Neg Hx     Social History Social History   Tobacco Use   Smoking status: Former    Current packs/day: 0.00     Average packs/day: 0.5 packs/day for 50.0  years (25.0 ttl pk-yrs)    Types: Cigarettes    Start date: 04/08/1972    Quit date: 04/09/2022    Years since quitting: 0.5   Smokeless tobacco: Never   Tobacco comments:    smokes 1/2 pack per day 11/19/2019  Vaping Use   Vaping status: Never Used  Substance Use Topics   Alcohol use: No    Alcohol/week: 0.0 standard drinks of alcohol   Drug use: No     Allergies   Penicillins, Sulfonamide derivatives, Avapro [irbesartan], and Levaquin [levofloxacin]   Review of Systems Review of Systems Per HPI  Physical Exam Triage Vital Signs ED Triage Vitals  Encounter Vitals Group     BP 11/05/22 1756 (!) 144/90     Systolic BP Percentile --      Diastolic BP Percentile --      Pulse Rate 11/05/22 1756 69     Resp 11/05/22 1756 20     Temp 11/05/22 1756 98.7 F (37.1 C)     Temp Source 11/05/22 1756 Oral     SpO2 11/05/22 1756 97 %     Weight --      Height --      Head Circumference --      Peak Flow --      Pain Score 11/05/22 1757 10     Pain Loc --      Pain Education --      Exclude from Growth Chart --    No data found.  Updated Vital Signs BP (!) 144/90 (BP Location: Right Arm)   Pulse 69   Temp 98.7 F (37.1 C) (Oral)   Resp 20   SpO2 97%   Visual Acuity Right Eye Distance:   Left Eye Distance:   Bilateral Distance:    Right Eye Near:   Left Eye Near:    Bilateral Near:     Physical Exam Vitals and nursing note reviewed.  Constitutional:      General: She is not in acute distress.    Appearance: She is well-developed.  HENT:     Head: Normocephalic.     Right Ear: Tympanic membrane and ear canal normal.     Left Ear: Tympanic membrane and ear canal normal.     Nose: Congestion present.     Right Turbinates: Enlarged and swollen.     Left Turbinates: Enlarged and swollen.     Right Sinus: No maxillary sinus tenderness or frontal sinus tenderness.     Left Sinus: No maxillary sinus tenderness or  frontal sinus tenderness.     Mouth/Throat:     Lips: Pink.     Mouth: Mucous membranes are moist.     Pharynx: Pharyngeal swelling, posterior oropharyngeal erythema and postnasal drip present.     Tonsils: No tonsillar exudate. 1+ on the right. 1+ on the left.  Eyes:     Conjunctiva/sclera: Conjunctivae normal.     Pupils: Pupils are equal, round, and reactive to light.  Cardiovascular:     Rate and Rhythm: Normal rate and regular rhythm.     Heart sounds: Normal heart sounds.  Pulmonary:     Effort: Pulmonary effort is normal. No respiratory distress.     Breath sounds: Normal breath sounds. No stridor. No wheezing, rhonchi or rales.  Abdominal:     General: Bowel sounds are normal.     Palpations: Abdomen is soft.     Tenderness: There is no abdominal tenderness.  Musculoskeletal:  Cervical back: Normal range of motion.  Lymphadenopathy:     Cervical: No cervical adenopathy.  Skin:    General: Skin is warm and dry.  Neurological:     General: No focal deficit present.     Mental Status: She is alert and oriented to person, place, and time.  Psychiatric:        Mood and Affect: Mood normal.        Behavior: Behavior normal.      UC Treatments / Results  Labs (all labs ordered are listed, but only abnormal results are displayed) Labs Reviewed  CULTURE, GROUP A STREP ([facility])  SARS CORONAVIRUS 2 (TAT 6-24 HRS)  POCT RAPID STREP A (OFFICE)    EKG   Radiology No results found.  Procedures Procedures (including critical care time)  Medications Ordered in UC Medications - No data to display  Initial Impression / Assessment and Plan / UC Course  I have reviewed the triage vital signs and the nursing notes.  Pertinent labs & imaging results that were available during my care of the patient were reviewed by me and considered in my medical decision making (see chart for details).  The rapid strep test was negative, COVID test and throat culture are pending.   Suspect patient's symptoms are of viral etiology.  Will treat patient's throat pain with  Magic mouthwash.  For her nasal congestion, patient was advised to can continue Flonase, and over-the-counter analgesics such as Tylenol for pain or discomfort.  Supportive care recommendations were provided and discussed with the patient to include warm salt water gargles, and a soft diet.  Patient was advised she will be contacted if the culture results are positive.  Patient was advised symptoms should improve over the next 5 to 7 days; however, if not, recommend patient follow-up with her PCP for further evaluation.  Patient is in agreement with this plan of care and verbalizes understanding.  All questions were answered.  Patient stable for discharge.  Final Clinical Impressions(s) / UC Diagnoses   Final diagnoses:  Sore throat  Viral illness  Encounter for screening for COVID-19     Discharge Instructions      The rapid strep test was negative.  A throat culture and COVID test are pending.  You will be contacted if the pending test results are abnormal.  You will also have access to your results via MyChart. Take medication as prescribed. Continue use of Tylenol, and continue your current allergy medication, Flonase. Warm salt water gargles 3-4 times daily as needed for throat pain or discomfort. Chloraseptic throat spray or throat lozenges to help with pain or discomfort. Recommend normal saline nasal spray throughout the day to help with nasal congestion and a runny nose. Recommend a soft diet to include soup, broth, yogurt, pudding, or Jell-O while symptoms persist. As discussed, if symptoms do not improve over the next 5 to 7 days, please follow-up with your primary care physician for further evaluation. Follow-up as needed.     ED Prescriptions     Medication Sig Dispense Auth. Provider   magic mouthwash (nystatin, hydrocortisone, diphenhydrAMINE, lidocaine) suspension Swish and spit 5  mLs 4 (four) times daily as needed for up to 7 days for mouth pain. 200 mL Leath-Warren, Sadie Haber, NP      PDMP not reviewed this encounter.   Abran Cantor, NP 11/05/22 1824

## 2022-11-05 NOTE — Discharge Instructions (Addendum)
The rapid strep test was negative.  A throat culture and COVID test are pending.  You will be contacted if the pending test results are abnormal.  You will also have access to your results via MyChart. Take medication as prescribed. Continue use of Tylenol, and continue your current allergy medication, Flonase. Warm salt water gargles 3-4 times daily as needed for throat pain or discomfort. Chloraseptic throat spray or throat lozenges to help with pain or discomfort. Recommend normal saline nasal spray throughout the day to help with nasal congestion and a runny nose. Recommend a soft diet to include soup, broth, yogurt, pudding, or Jell-O while symptoms persist. As discussed, if symptoms do not improve over the next 5 to 7 days, please follow-up with your primary care physician for further evaluation. Follow-up as needed.

## 2022-11-05 NOTE — ED Triage Notes (Signed)
Sore throat, sinus pain and congestion pain in left side of neck when turning her head that started Friday. Taking tylenol.

## 2022-11-06 ENCOUNTER — Ambulatory Visit: Payer: 59 | Admitting: Orthopaedic Surgery

## 2022-11-06 DIAGNOSIS — Z515 Encounter for palliative care: Secondary | ICD-10-CM | POA: Diagnosis not present

## 2022-11-06 DIAGNOSIS — I5022 Chronic systolic (congestive) heart failure: Secondary | ICD-10-CM | POA: Diagnosis not present

## 2022-11-06 DIAGNOSIS — J449 Chronic obstructive pulmonary disease, unspecified: Secondary | ICD-10-CM | POA: Diagnosis not present

## 2022-11-06 DIAGNOSIS — J9610 Chronic respiratory failure, unspecified whether with hypoxia or hypercapnia: Secondary | ICD-10-CM | POA: Diagnosis not present

## 2022-11-06 LAB — SARS CORONAVIRUS 2 (TAT 6-24 HRS): SARS Coronavirus 2: NEGATIVE

## 2022-11-08 LAB — CULTURE, GROUP A STREP (THRC)

## 2022-11-09 ENCOUNTER — Other Ambulatory Visit: Payer: Self-pay

## 2022-11-09 MED ORDER — PANTOPRAZOLE SODIUM 40 MG PO TBEC: 40.0000 mg | DELAYED_RELEASE_TABLET | Freq: Every morning | ORAL | 0 refills | Status: DC

## 2022-11-09 MED ORDER — SPIRONOLACTONE 25 MG PO TABS: 25.0000 mg | ORAL_TABLET | Freq: Every day | ORAL | 0 refills | Status: DC

## 2022-11-13 ENCOUNTER — Ambulatory Visit (INDEPENDENT_AMBULATORY_CARE_PROVIDER_SITE_OTHER): Payer: 59 | Admitting: Orthopaedic Surgery

## 2022-11-13 ENCOUNTER — Encounter: Payer: Self-pay | Admitting: Orthopaedic Surgery

## 2022-11-13 VITALS — BP 161/92 | HR 62 | Ht 65.0 in | Wt 181.0 lb

## 2022-11-13 DIAGNOSIS — G8929 Other chronic pain: Secondary | ICD-10-CM

## 2022-11-13 DIAGNOSIS — Z7901 Long term (current) use of anticoagulants: Secondary | ICD-10-CM | POA: Diagnosis not present

## 2022-11-13 DIAGNOSIS — M25561 Pain in right knee: Secondary | ICD-10-CM | POA: Diagnosis not present

## 2022-11-13 NOTE — Progress Notes (Signed)
My knee is still hurting.  Her right knee has more pain.  She has no giving way. She has swelling and popping.  There is no redness or trauma.  Right knee has ROM 0 to 105, limp right, NV intact, stable, medial pain, no distal edema.  Encounter Diagnoses  Name Primary?   Chronic pain of right knee Yes   Long term (current) use of anticoagulants    PROCEDURE NOTE:  The patient requests injections of the right knee , verbal consent was obtained.  The right knee was prepped appropriately after time out was performed.   Sterile technique was observed and injection of 1 cc of DepoMedrol 40mg  with several cc's of plain xylocaine. Anesthesia was provided by ethyl chloride and a 20-gauge needle was used to inject the knee area. The injection was tolerated well.  A band aid dressing was applied.  The patient was advised to apply ice later today and tomorrow to the injection sight as needed.  Return in one month.  Call if any problem.  Precautions discussed.  Electronically Signed Darreld Mclean, MD 10/29/202410:07 AM

## 2022-11-14 DIAGNOSIS — M25561 Pain in right knee: Secondary | ICD-10-CM | POA: Diagnosis not present

## 2022-11-14 DIAGNOSIS — G8929 Other chronic pain: Secondary | ICD-10-CM | POA: Diagnosis not present

## 2022-11-14 MED ORDER — METHYLPREDNISOLONE ACETATE 40 MG/ML IJ SUSP
40.0000 mg | Freq: Once | INTRAMUSCULAR | Status: AC
  Administered 2022-11-14: 40 mg via INTRA_ARTICULAR

## 2022-11-14 NOTE — Addendum Note (Signed)
Addended by: Michaele Offer on: 11/14/2022 03:33 PM   Modules accepted: Orders

## 2022-11-22 ENCOUNTER — Other Ambulatory Visit: Payer: Self-pay

## 2022-11-22 DIAGNOSIS — R918 Other nonspecific abnormal finding of lung field: Secondary | ICD-10-CM

## 2022-11-22 DIAGNOSIS — J4489 Other specified chronic obstructive pulmonary disease: Secondary | ICD-10-CM

## 2022-11-22 MED ORDER — BISOPROLOL FUMARATE 5 MG PO TABS: 5.0000 mg | ORAL_TABLET | Freq: Every day | ORAL | 0 refills | Status: DC

## 2022-11-22 MED ORDER — FLUTICASONE PROPIONATE 50 MCG/ACT NA SUSP: NASAL | 0 refills | Status: DC

## 2022-11-22 MED ORDER — DAPAGLIFLOZIN PROPANEDIOL 10 MG PO TABS: 10.0000 mg | ORAL_TABLET | Freq: Every day | ORAL | 0 refills | Status: DC

## 2022-11-22 MED ORDER — ENTRESTO 24-26 MG PO TABS: 1.0000 | ORAL_TABLET | Freq: Two times a day (BID) | ORAL | 0 refills | Status: DC

## 2022-11-26 DIAGNOSIS — I5021 Acute systolic (congestive) heart failure: Secondary | ICD-10-CM | POA: Diagnosis not present

## 2022-11-26 DIAGNOSIS — J449 Chronic obstructive pulmonary disease, unspecified: Secondary | ICD-10-CM | POA: Diagnosis not present

## 2022-11-27 ENCOUNTER — Other Ambulatory Visit: Payer: Self-pay

## 2022-11-27 DIAGNOSIS — J4489 Other specified chronic obstructive pulmonary disease: Secondary | ICD-10-CM

## 2022-11-27 DIAGNOSIS — R918 Other nonspecific abnormal finding of lung field: Secondary | ICD-10-CM

## 2022-11-27 MED ORDER — SYMBICORT 160-4.5 MCG/ACT IN AERO: 2.0000 | INHALATION_SPRAY | Freq: Two times a day (BID) | RESPIRATORY_TRACT | 0 refills | Status: DC

## 2022-11-30 ENCOUNTER — Other Ambulatory Visit: Payer: Self-pay | Admitting: Family Medicine

## 2022-12-02 DIAGNOSIS — J441 Chronic obstructive pulmonary disease with (acute) exacerbation: Secondary | ICD-10-CM | POA: Diagnosis not present

## 2022-12-02 DIAGNOSIS — I5021 Acute systolic (congestive) heart failure: Secondary | ICD-10-CM | POA: Diagnosis not present

## 2022-12-02 DIAGNOSIS — J9601 Acute respiratory failure with hypoxia: Secondary | ICD-10-CM | POA: Diagnosis not present

## 2022-12-02 DIAGNOSIS — M5410 Radiculopathy, site unspecified: Secondary | ICD-10-CM | POA: Diagnosis not present

## 2022-12-04 ENCOUNTER — Ambulatory Visit (INDEPENDENT_AMBULATORY_CARE_PROVIDER_SITE_OTHER): Payer: 59 | Admitting: Family Medicine

## 2022-12-04 ENCOUNTER — Encounter: Payer: Self-pay | Admitting: Family Medicine

## 2022-12-04 VITALS — BP 119/77 | HR 66 | Ht 65.0 in | Wt 182.0 lb

## 2022-12-04 DIAGNOSIS — Z853 Personal history of malignant neoplasm of breast: Secondary | ICD-10-CM

## 2022-12-04 DIAGNOSIS — C50212 Malignant neoplasm of upper-inner quadrant of left female breast: Secondary | ICD-10-CM | POA: Diagnosis not present

## 2022-12-04 DIAGNOSIS — I10 Essential (primary) hypertension: Secondary | ICD-10-CM

## 2022-12-04 DIAGNOSIS — Z0001 Encounter for general adult medical examination with abnormal findings: Secondary | ICD-10-CM | POA: Diagnosis not present

## 2022-12-04 DIAGNOSIS — R251 Tremor, unspecified: Secondary | ICD-10-CM

## 2022-12-04 DIAGNOSIS — Z170 Estrogen receptor positive status [ER+]: Secondary | ICD-10-CM

## 2022-12-04 MED ORDER — MISC. DEVICES MISC
0 refills | Status: AC
Start: 2022-12-04 — End: ?

## 2022-12-04 NOTE — Patient Instructions (Addendum)
F/u in 4 months,call if you need me sooner, check oxygen at rest and wth activity when pt checks in   Please get labs ordered in July this week , Nurse please add CBC to July lab  Congrats on smoking cessation.  Mastectomy supplies are sent in  No falls please.  Increase vegetables and fruit redce cake and ice cream  Happy Birthday!  Thanks for choosing [facility], we consider it a privelige to serve you.

## 2022-12-05 DIAGNOSIS — Z0001 Encounter for general adult medical examination with abnormal findings: Secondary | ICD-10-CM | POA: Insufficient documentation

## 2022-12-05 DIAGNOSIS — I10 Essential (primary) hypertension: Secondary | ICD-10-CM | POA: Diagnosis not present

## 2022-12-05 DIAGNOSIS — E785 Hyperlipidemia, unspecified: Secondary | ICD-10-CM | POA: Diagnosis not present

## 2022-12-05 DIAGNOSIS — E559 Vitamin D deficiency, unspecified: Secondary | ICD-10-CM | POA: Diagnosis not present

## 2022-12-05 NOTE — Assessment & Plan Note (Signed)
Persistent, though less marked at today's visit

## 2022-12-05 NOTE — Progress Notes (Signed)
    [name]     MRN: [id number]      DOB: 23-Apr-1945  Chief Complaint  Patient presents with   Annual Exam    CPE requesting bra rx to [employer]     HPI: Patient is in for annual physical exam. Immunization is reviewed , and  she needs and plans t get TdaP and covid vaccines  PE: BP 119/77 (BP Location: Right Arm, Patient Position: Sitting, Cuff Size: Large)   Pulse 66   Ht 5\' 5"  (1.651 m)   Wt 182 lb 0.6 oz (82.6 kg)   SpO2 94%   BMI 30.29 kg/m   Pleasant  female, alert and oriented x 3, in no cardio-pulmonary distress. Afebrile. HEENT No facial trauma or asymetry. Sinuses non tender.  Extra occullar muscles intact.. External ears normal, . Neck: decreased ROM,  no adenopathy,JVD or thyromegaly.No bruits.  Chest: Clear to ascultation bilaterally.decrased though adequate air entry , few scattere wheezes no crackles. Non tender to palpation   Cardiovascular system; Heart sounds normal,  S1 and  S2 ,no S3.  No murmur, or thrill.  Peripheral pulses normal.  Abdomen: Soft, non tender,    Musculoskeletal exam: Decreased  ROM of spine, hips , shoulders and knees.Ambulates with a cane deformity ,swelling and  crepitus noted. No muscle wasting or atrophy.   Neurologic: Cranial nerves 2 to 12 intact. Power, tone ,sensation  normal throughout. No disturbance in gait. No tremor.  Skin: Intact, no ulceration, erythema , scaling or rash noted. Pigmentation normal throughout  Psych; Normal mood and affect. Judgement and concentration normal   Assessment & Plan:  Encounter for Medicare annual examination with abnormal findings Annual exam as documented. Counseling done  re healthy lifestyle involving commitment to 150 minutes exercise per week, heart healthy diet, and attaining healthy weight.The importance of adequate sleep also discussed. Regular seat belt use and home safety, is also discussed. Changes in health habits are decided on by the  patient with goals and time frames  set for achieving them. Immunization and cancer screening needs are specifically addressed at this visit.   Tremor Persistent, though less marked at today's visit

## 2022-12-05 NOTE — Assessment & Plan Note (Signed)

## 2022-12-05 NOTE — Assessment & Plan Note (Signed)
Mastectomy supplies are prescribed

## 2022-12-06 LAB — CMP14+EGFR
ALT: 15 [IU]/L (ref 0–32)
AST: 18 [IU]/L (ref 0–40)
Albumin: 4.2 g/dL (ref 3.8–4.8)
Alkaline Phosphatase: 82 [IU]/L (ref 44–121)
BUN/Creatinine Ratio: 18 (ref 12–28)
BUN: 22 mg/dL (ref 8–27)
Bilirubin Total: 0.3 mg/dL (ref 0.0–1.2)
CO2: 25 mmol/L (ref 20–29)
Calcium: 10 mg/dL (ref 8.7–10.3)
Chloride: 101 mmol/L (ref 96–106)
Creatinine, Ser: 1.23 mg/dL — ABNORMAL HIGH (ref 0.57–1.00)
Globulin, Total: 2.7 g/dL (ref 1.5–4.5)
Glucose: 96 mg/dL (ref 70–99)
Potassium: 5.5 mmol/L — ABNORMAL HIGH (ref 3.5–5.2)
Sodium: 141 mmol/L (ref 134–144)
Total Protein: 6.9 g/dL (ref 6.0–8.5)
eGFR: 45 mL/min/{1.73_m2} — ABNORMAL LOW (ref >59)

## 2022-12-06 LAB — LIPID PANEL
Chol/HDL Ratio: 2.4 {ratio} (ref 0.0–4.4)
Cholesterol, Total: 208 mg/dL — ABNORMAL HIGH (ref 100–199)
HDL: 88 mg/dL (ref >39)
LDL Chol Calc (NIH): 98 mg/dL (ref 0–99)
Triglycerides: 129 mg/dL (ref 0–149)
VLDL Cholesterol Cal: 22 mg/dL (ref 5–40)

## 2022-12-06 LAB — TSH: TSH: 2.65 u[IU]/mL (ref 0.450–4.500)

## 2022-12-06 LAB — CBC
Hematocrit: 47.4 % — ABNORMAL HIGH (ref 34.0–46.6)
Hemoglobin: 14.5 g/dL (ref 11.1–15.9)
MCH: 28.5 pg (ref 26.6–33.0)
MCHC: 30.6 g/dL — ABNORMAL LOW (ref 31.5–35.7)
MCV: 93 fL (ref 79–97)
Platelets: 407 10*3/uL (ref 150–450)
RBC: 5.09 x10E6/uL (ref 3.77–5.28)
RDW: 14.2 % (ref 11.7–15.4)
WBC: 10.6 10*3/uL (ref 3.4–10.8)

## 2022-12-06 LAB — VITAMIN D 25 HYDROXY (VIT D DEFICIENCY, FRACTURES): Vit D, 25-Hydroxy: 37 ng/mL (ref 30.0–100.0)

## 2022-12-12 ENCOUNTER — Ambulatory Visit (INDEPENDENT_AMBULATORY_CARE_PROVIDER_SITE_OTHER): Payer: 59 | Admitting: Orthopaedic Surgery

## 2022-12-12 ENCOUNTER — Encounter: Payer: Self-pay | Admitting: Orthopaedic Surgery

## 2022-12-12 DIAGNOSIS — G8929 Other chronic pain: Secondary | ICD-10-CM | POA: Diagnosis not present

## 2022-12-12 DIAGNOSIS — M25561 Pain in right knee: Secondary | ICD-10-CM | POA: Diagnosis not present

## 2022-12-12 MED ORDER — METHYLPREDNISOLONE ACETATE 40 MG/ML IJ SUSP
40.0000 mg | Freq: Once | INTRAMUSCULAR | Status: AC
  Administered 2022-12-12: 40 mg via INTRA_ARTICULAR

## 2022-12-12 NOTE — Addendum Note (Signed)
Addended by: Michaele Offer on: 12/12/2022 10:57 AM   Modules accepted: Orders

## 2022-12-12 NOTE — Progress Notes (Signed)
PROCEDURE NOTE:  The patient requests injections of the right knee , verbal consent was obtained.  The right knee was prepped appropriately after time out was performed.   Sterile technique was observed and injection of 1 cc of DepoMedrol 40mg  with several cc's of plain xylocaine. Anesthesia was provided by ethyl chloride and a 20-gauge needle was used to inject the knee area. The injection was tolerated well.  A band aid dressing was applied.  The patient was advised to apply ice later today and tomorrow to the injection sight as needed.  Encounter Diagnosis  Name Primary?   Chronic pain of right knee Yes   Return January 23, 2023.  Call if any problem.  Precautions discussed.  Electronically Signed Darreld Mclean, MD 11/27/202410:13 AM

## 2022-12-17 DIAGNOSIS — J449 Chronic obstructive pulmonary disease, unspecified: Secondary | ICD-10-CM | POA: Diagnosis not present

## 2022-12-17 DIAGNOSIS — I5022 Chronic systolic (congestive) heart failure: Secondary | ICD-10-CM | POA: Diagnosis not present

## 2022-12-17 DIAGNOSIS — Z515 Encounter for palliative care: Secondary | ICD-10-CM | POA: Diagnosis not present

## 2022-12-17 DIAGNOSIS — J9610 Chronic respiratory failure, unspecified whether with hypoxia or hypercapnia: Secondary | ICD-10-CM | POA: Diagnosis not present

## 2022-12-20 ENCOUNTER — Other Ambulatory Visit: Payer: Self-pay | Admitting: Family Medicine

## 2022-12-24 ENCOUNTER — Other Ambulatory Visit: Payer: Self-pay | Admitting: Family Medicine

## 2022-12-26 DIAGNOSIS — I5021 Acute systolic (congestive) heart failure: Secondary | ICD-10-CM | POA: Diagnosis not present

## 2022-12-26 DIAGNOSIS — J449 Chronic obstructive pulmonary disease, unspecified: Secondary | ICD-10-CM | POA: Diagnosis not present

## 2022-12-31 ENCOUNTER — Other Ambulatory Visit: Payer: Self-pay | Admitting: Family Medicine

## 2022-12-31 DIAGNOSIS — J4489 Other specified chronic obstructive pulmonary disease: Secondary | ICD-10-CM

## 2022-12-31 DIAGNOSIS — R918 Other nonspecific abnormal finding of lung field: Secondary | ICD-10-CM

## 2023-01-01 DIAGNOSIS — I5021 Acute systolic (congestive) heart failure: Secondary | ICD-10-CM | POA: Diagnosis not present

## 2023-01-01 DIAGNOSIS — J9601 Acute respiratory failure with hypoxia: Secondary | ICD-10-CM | POA: Diagnosis not present

## 2023-01-01 DIAGNOSIS — M5410 Radiculopathy, site unspecified: Secondary | ICD-10-CM | POA: Diagnosis not present

## 2023-01-01 DIAGNOSIS — J441 Chronic obstructive pulmonary disease with (acute) exacerbation: Secondary | ICD-10-CM | POA: Diagnosis not present

## 2023-01-11 ENCOUNTER — Encounter: Payer: Self-pay | Admitting: Family Medicine

## 2023-01-11 ENCOUNTER — Ambulatory Visit (INDEPENDENT_AMBULATORY_CARE_PROVIDER_SITE_OTHER): Payer: 59 | Admitting: Family Medicine

## 2023-01-11 VITALS — BP 144/88 | HR 70 | Ht 65.0 in | Wt 186.0 lb

## 2023-01-11 DIAGNOSIS — J449 Chronic obstructive pulmonary disease, unspecified: Secondary | ICD-10-CM

## 2023-01-11 DIAGNOSIS — I4891 Unspecified atrial fibrillation: Secondary | ICD-10-CM | POA: Diagnosis not present

## 2023-01-11 DIAGNOSIS — H543 Unqualified visual loss, both eyes: Secondary | ICD-10-CM | POA: Insufficient documentation

## 2023-01-11 DIAGNOSIS — H10503 Unspecified blepharoconjunctivitis, bilateral: Secondary | ICD-10-CM | POA: Diagnosis not present

## 2023-01-11 DIAGNOSIS — R251 Tremor, unspecified: Secondary | ICD-10-CM | POA: Diagnosis not present

## 2023-01-11 MED ORDER — ERYTHROMYCIN 5 MG/GM OP OINT: 1.0000 | TOPICAL_OINTMENT | Freq: Every day | OPHTHALMIC | 0 refills | Status: DC

## 2023-01-11 NOTE — Patient Instructions (Signed)
F/U as before, call if you need me sooner  You need to see eye specialist re vision loss  You need to go back to Neurologist re tremor  Antibiotic oint is prescribed for 1 week, for bedtime use   Thanks for choosing [facility], we consider it a privelige to serve you.

## 2023-01-11 NOTE — Progress Notes (Unsigned)
   VANESSAMARIE HEGDE     MRN: [id number]      DOB: 03/15/1945  Chief Complaint  Patient presents with   Eye Problem    Started with L eye now in both eyes can only see shadows family reports unable to feed herself due to no vision worse this week drainage    HPI Ms. Facey is here with acute loss of vision in both eyes, states she recently bumped into a door due to loss of vision and now c/o left ear discomfort , which is the side of her face that she hit C/o sticky drainage from both eyes , left worse than right, , no specific eye pain Recently seen  by ophthalmology locally and retinal specialist , treatment options limited due to her excessive tremor per report of Grand daughter ROS Denies recent fever or chills. Denies sinus pressure, nasal congestion,  or sore throat. Denies chest congestion, productive cough or wheezing.  Denies depression, has chronic anxiety or insomnia. Denies skin break down or rash.   PE  BP (!) 144/88   Pulse 70   Ht 5\' 5"  (1.651 m)   Wt 186 lb (84.4 kg)   SpO2 92%   BMI 30.95 kg/m    Patient alert and oriented and in no cardiopulmonary distress.  HEENT: No facial asymmetry, EOMI,     Neck supple .mild periorbitl erythema of left eye, scsnt drainage noted frrom both eyes, . BOth TM clear with good light reflex  Chest: Clear to auscultation bilaterally.Decreased air entry  CVS: S1, S2 no murmurs, no S3.Regular rate.  ABD: Soft non tender.   Ext: No edema  MS: Adequate ROM spine, shoulders, hips and knees.  Skin: Intact, no ulcerations or rash noted.  Psych: Good eye contact, normal affect. Memory intact not anxious or depressed appearing.  CNS: CN 2-12 intact, power,  normal throughout.no focal deficits noted.   Assessment & Plan  Conjunctivitis Acute onset, topical antibiotic prescribed  Atrial fibrillation (HCC) Rate  controlled and on eliqiuis

## 2023-01-12 DIAGNOSIS — H109 Unspecified conjunctivitis: Secondary | ICD-10-CM | POA: Insufficient documentation

## 2023-01-12 NOTE — Assessment & Plan Note (Signed)
Followed by pulmonary and doing well

## 2023-01-12 NOTE — Assessment & Plan Note (Signed)
Rate  controlled and on eliqiuis

## 2023-01-12 NOTE — Assessment & Plan Note (Signed)
Acute onset, topical antibiotic prescribed

## 2023-01-13 NOTE — Assessment & Plan Note (Signed)
Increasingly debilitating, has been evaluated by Neurology will need to return for re eval as there is no improvement andindeed there is progression over time

## 2023-01-14 NOTE — Progress Notes (Signed)
 Virtual Visit Via Video       Consent was obtained for video visit:  Yes.   Answered questions that patient had about telehealth interaction:  Yes.   I discussed the limitations, risks, security and privacy concerns of performing an evaluation and management service by telemedicine. I also discussed with the patient that there may be a patient responsible charge related to this service. The patient expressed understanding and agreed to proceed.  Pt location: Home Physician Location: office Name of referring provider:  Antonetta Rollene BRAVO, MD I connected with Cathlean LULLA Seidel at patients initiation/request on 01/15/2023 at 10:45 AM EST by video enabled telemedicine application and verified that I am speaking with the correct person using two identifiers. Pt MRN:  [id number] Pt DOB:  February 17, 1945 Video Participants:  Cathlean LULLA Seidel;  granddaughter supplements hx  Assessment/Plan:    1.  Tremor  -Patient had some baseline tremor, likely due to her lung medications, for a long time, but tremor is made much worse by the addition of amiodarone .  Her amiodarone  was reduced because of tremor in July 2024, but it still persists.  Cardiology made notes back in July that they may have to stop it if tremor persisted.  I think that is really the first step.  I tried to reach out to the PA seeing her but she is out for the holidays. Told patient to make a follow up appt to discuss.  She and I did discuss that if it is able to be d/c, effects can last up to 6 months. -If they aren't able to stop the amiodarone  then we can certainly trial medication.  However, her options are very limited.  She is on eliquis  so primidone is not an option.  She is already on a low dose beta blocker.  That takes out all of the first line medications.  The next viable option would be gabapentin  2.  Vision loss  -I do not have any ophthalmology or retina records, but apparently the patient has been seen by both.  Her  granddaughter is trying to get a release so that we have the records.  This does not sound like a primary neurologic issue, as it would not cause slow onset vision loss in both eyes.  However, what concerns me the most is that the granddaughter tells me that this all started around the same time as the amiodarone  was initiated.  I worry that this could be amiodarone  associated optic neuropathy, which is rare and very much out of my field.  Again, I do not even know if the patient has optic neuropathy, so records would be helpful.  Most importantly, I asked the granddaughter to call cardiology today to get a f/u and to let them know.  I will also let cardiology know.  Subjective:   [name] was seen today in follow up for tremor.  My previous records were reviewed prior to todays visit.  Granddaughter with pt and supplements hx.  Switched to VV just prior to appt as pt didn't have enough O2 to get to appt.    I saw the patient back in June.  At that point in time, it sounded like the patient had baseline tremor, which was likely due to her lung medications, but patient had been placed on amiodarone  which made tremor much worse.  I talked to her cardiologist and he was agreeable to decreasing the amiodarone .  She saw the cardiology PA about a month  after she saw me and she was on 100 mg of amiodarone  then.  Notes indicate that they may have to stop it if tremor persisted.  The PA noted in her notes that patient was to follow-up in 3 months, but I do not see patient has yet been back.  Patient states today I do not think I will get control of my tremor.  When people get old, they just get tremor.  My family gets tremor when they get old.  Separately, granddaughter states that the patient started having vision loss in the left eye about the same time that her tremor started back in March.  She has apparently seen ophthalmology and a retina specialist.  They told her they could not do surgery because of  tremor.  They also told her that she had a problem with the nerve.  She states that primary care told her to follow-up with me because of that.  They do not know the diagnosis exactly.  Primary care records just state that patient had conjunctivitis and was given erythromycin .  Her granddaughter states that the patient is basically going blind and the patient states I can see shadows.    PREVIOUS MEDICATIONS: none to date  ALLERGIES:   Allergies  Allergen Reactions   Penicillins Hives, Shortness Of Breath and Swelling    Has patient had a PCN reaction causing immediate rash, facial/tongue/throat swelling, SOB or lightheadedness with hypotension: yes Has patient had a PCN reaction causing severe rash involving mucus membranes or skin necrosis:no Has patient had a PCN reaction that required hospitalization: yes Has patient had a PCN reaction occurring within the last 10 years: no If all of the above answers are NO, then may proceed with Cephalosporin use. Tolerates rocephin /keflex    Sulfonamide Derivatives Other (See Comments)    Shaking all over, seizure like symptoms. Hospitalization resulted    Avapro  [Irbesartan ] Other (See Comments)    Pt associates avapro  with bruising though aware scientifically not the case, wants to chanmge med   Levaquin  [Levofloxacin ] Itching    CURRENT MEDICATIONS:  Current Meds  Medication Sig   acetaminophen  (TYLENOL ) 325 MG tablet Take 650 mg by mouth every 6 (six) hours as needed for moderate pain.    albuterol  (PROVENTIL ) (2.5 MG/3ML) 0.083% nebulizer solution INHALE ONE VIAL VIA NEBULIZER THREE TIMES DAILY. (Patient taking differently: Take 2.5 mg by nebulization 3 (three) times daily as needed for wheezing or shortness of breath.)   albuterol  (VENTOLIN  HFA) 108 (90 Base) MCG/ACT inhaler INHALE (2) PUFFS EVERY FOURHOURS AS NEEDED ONLY IF YOU CANT CATCH YOUR BREATH (Patient taking differently: Inhale 2 puffs into the lungs every 4 (four) hours as  needed for wheezing or shortness of breath. INHALE (2) PUFFS EVERY FOURHOURS AS NEEDED ONLY IF YOU CANT CATCH YOUR BREATH)   amiodarone  (PACERONE ) 200 MG tablet Take 0.5 tablets (100 mg total) by mouth daily.   apixaban  (ELIQUIS ) 5 MG TABS tablet Take 1 tablet (5 mg total) by mouth 2 (two) times daily.   ATROVENT  HFA 17 MCG/ACT inhaler INHALE 2 PUFFS BY MOUTH FOUR TIMES A DAY.   bisoprolol  (ZEBETA ) 5 MG tablet Take 1 tablet (5 mg total) by mouth daily.   Calcium  Carbonate-Vitamin D  (CALCIUM  600 + D PO) Take 1 tablet by mouth 2 (two) times daily.   Cyanocobalamin  (VITAMIN B 12) 500 MCG TABS Take 500 mcg by mouth daily. Take one tablet by mouth once daily   cyclobenzaprine  (FLEXERIL ) 5 MG tablet Take 1 tablet (5  mg total) by mouth at bedtime.   ENTRESTO  24-26 MG Take 1 tablet by mouth 2 (two) times daily.   erythromycin  ophthalmic ointment Place 1 Application into both eyes at bedtime. Apply daily at bedtime to both eyes for 1 week   FARXIGA  10 MG TABS tablet Take 1 tablet (10 mg total) by mouth daily.   fluticasone  (FLONASE ) 50 MCG/ACT nasal spray PLACE 2 SPRAYS IN EACH NOSTRIL DAILY.   hydrOXYzine  (ATARAX ) 25 MG tablet Take 1 tablet (25 mg total) by mouth 2 (two) times daily as needed for itching.   Misc. Devices MISC Please provide patient with mastectomy bra and prosthesis. Dx: History of left breast cancer   Naphazoline HCl (CLEAR EYES OP) Place 1 drop into both eyes daily as needed (irritation).   pantoprazole  (PROTONIX ) 40 MG tablet Take 1 tablet (40 mg total) by mouth every morning.   polyethylene glycol powder (GLYCOLAX /MIRALAX ) powder MIX 1 CAPFUL IN 8 OUNCES OF JUICE OR WATER  AND DRINK ONCE DAILY. (Patient taking differently: Take 17 g by mouth daily.)   rosuvastatin  (CRESTOR ) 10 MG tablet TAKE 1 TABLET BY MOUTH ONCE A DAY.   spironolactone  (ALDACTONE ) 25 MG tablet Take 1 tablet (25 mg total) by mouth daily.   SYMBICORT  160-4.5 MCG/ACT inhaler Inhale 2 puffs into the lungs 2 (two) times  daily.   traMADol  (ULTRAM ) 50 MG tablet TAKE (1) TABLET BY MOUTH EVERY 8 HOURS AS NEEDED.      Objective:    PHYSICAL EXAMINATION:    VITALS:  There were no vitals filed for this visit.  GEN:  The patient appears stated age and is in NAD.  Neurological examination:  Orientation: The patient is alert and oriented x3. Cranial nerves: There is good facial symmetry. The speech is fluent and clear. Hearing is intact to conversational tone. Motor: Strength is at least antigravity x4.  Movement examination:  Abnormal movements: there is jaw tremor.  There is postural tremor, right greater than left.  There is intention tremor, right greater than left.   I have reviewed and interpreted the following labs independently   Chemistry      Component Value Date/Time   NA 141 12/05/2022 1002   K 5.5 (H) 12/05/2022 1002   CL 101 12/05/2022 1002   CO2 25 12/05/2022 1002   BUN 22 12/05/2022 1002   CREATININE 1.23 (H) 12/05/2022 1002   CREATININE 0.89 09/25/2019 0958      Component Value Date/Time   CALCIUM  10.0 12/05/2022 1002   ALKPHOS 82 12/05/2022 1002   AST 18 12/05/2022 1002   ALT 15 12/05/2022 1002   BILITOT 0.3 12/05/2022 1002      Lab Results  Component Value Date   WBC 10.6 12/05/2022   HGB 14.5 12/05/2022   HCT 47.4 (H) 12/05/2022   MCV 93 12/05/2022   PLT 407 12/05/2022   Lab Results  Component Value Date   TSH 2.650 12/05/2022     Chemistry      Component Value Date/Time   NA 141 12/05/2022 1002   K 5.5 (H) 12/05/2022 1002   CL 101 12/05/2022 1002   CO2 25 12/05/2022 1002   BUN 22 12/05/2022 1002   CREATININE 1.23 (H) 12/05/2022 1002   CREATININE 0.89 09/25/2019 0958      Component Value Date/Time   CALCIUM  10.0 12/05/2022 1002   ALKPHOS 82 12/05/2022 1002   AST 18 12/05/2022 1002   ALT 15 12/05/2022 1002   BILITOT 0.3 12/05/2022 1002  Follow up Instructions      -I discussed the assessment and treatment plan with the patient. The  patient was provided an opportunity to ask questions and all were answered. The patient agreed with the plan and demonstrated an understanding of the instructions.   The patient was advised to call back or seek an in-person evaluation if the symptoms worsen or if the condition fails to improve as anticipated.    Total time spent on today's visit was 30 minutes, including both face-to-face time and nonface-to-face time.  Time included that spent on review of records (prior notes available to me/labs/imaging if pertinent), discussing treatment and goals, answering patient's questions and coordinating care.   Asberry Schneider, DO   Cc:  Antonetta Rollene BRAVO, MD

## 2023-01-15 ENCOUNTER — Other Ambulatory Visit: Payer: Self-pay | Admitting: Family Medicine

## 2023-01-15 ENCOUNTER — Telehealth (INDEPENDENT_AMBULATORY_CARE_PROVIDER_SITE_OTHER): Payer: 59 | Admitting: Neurology

## 2023-01-15 ENCOUNTER — Telehealth: Payer: Self-pay | Admitting: Cardiology

## 2023-01-15 ENCOUNTER — Encounter: Payer: Self-pay | Admitting: Neurology

## 2023-01-15 ENCOUNTER — Telehealth: Payer: Self-pay | Admitting: Neurology

## 2023-01-15 DIAGNOSIS — H547 Unspecified visual loss: Secondary | ICD-10-CM

## 2023-01-15 DIAGNOSIS — G251 Drug-induced tremor: Secondary | ICD-10-CM | POA: Diagnosis not present

## 2023-01-15 DIAGNOSIS — T887XXA Unspecified adverse effect of drug or medicament, initial encounter: Secondary | ICD-10-CM | POA: Diagnosis not present

## 2023-01-15 DIAGNOSIS — R918 Other nonspecific abnormal finding of lung field: Secondary | ICD-10-CM

## 2023-01-15 DIAGNOSIS — J4489 Other specified chronic obstructive pulmonary disease: Secondary | ICD-10-CM

## 2023-01-15 NOTE — Telephone Encounter (Signed)
Dillynn granddaughter said to let Dr.tat know it was the optic neuropathic

## 2023-01-15 NOTE — Telephone Encounter (Signed)
Left detailed message to call back if any questions. Noted Dr.Tat sent message to cardiology.

## 2023-01-15 NOTE — Telephone Encounter (Signed)
 Spoke to pt's granddaughter- pt verbalized consent- who stated that since pt had come home from the hospital 03/2022- she has been shaking uncontrollably. Pt also started losing sight in her left eye 5 months ago and her left eye 2 weeks ago. Pt's granddaughter stated that since pt shakes so much she was told by ophthalmologist that laser surgery was out of the question. Pt's granddaughter stated that she was unaware Amiodarone  could cause these issues until pt was seen by Neurology and neurologist told them that it could be the Amio causing problems. Pt's granddaughter is requesting to have amio switched to a different medication.   Please advise.

## 2023-01-15 NOTE — Telephone Encounter (Signed)
 Pt c/o medication issue:  1. Name of Medication: amiodarone  (PACERONE ) 200 MG tablet   2. How are you currently taking this medication (dosage and times per day)? Take 0.5 tablets (100 mg total) by mouth daily.   3. Are you having a reaction (difficulty breathing--STAT)? No   4. What is your medication issue? Patient is having shaking spells and also is going blind due to medication. Please call asap to discuss other alternative medications

## 2023-01-17 NOTE — Progress Notes (Signed)
 Patient due for LDCT in March, however referral closed due to patient's age being over 51.

## 2023-01-17 NOTE — Telephone Encounter (Signed)
 Spoke to pt's granddaughter- pt verbalized consent- and verbalized plan of care at this time. Pt's granddaughter voiced understanding and had no questions or concerns at this time.

## 2023-01-22 DIAGNOSIS — J449 Chronic obstructive pulmonary disease, unspecified: Secondary | ICD-10-CM | POA: Diagnosis not present

## 2023-01-22 DIAGNOSIS — J9610 Chronic respiratory failure, unspecified whether with hypoxia or hypercapnia: Secondary | ICD-10-CM | POA: Diagnosis not present

## 2023-01-22 DIAGNOSIS — I5022 Chronic systolic (congestive) heart failure: Secondary | ICD-10-CM | POA: Diagnosis not present

## 2023-01-22 DIAGNOSIS — Z515 Encounter for palliative care: Secondary | ICD-10-CM | POA: Diagnosis not present

## 2023-01-23 ENCOUNTER — Ambulatory Visit: Payer: 59 | Admitting: Orthopaedic Surgery

## 2023-01-23 ENCOUNTER — Encounter: Payer: Self-pay | Admitting: Orthopaedic Surgery

## 2023-01-23 VITALS — BP 110/68 | HR 74

## 2023-01-23 DIAGNOSIS — H540X33 Blindness right eye category 3, blindness left eye category 3: Secondary | ICD-10-CM | POA: Diagnosis not present

## 2023-01-23 DIAGNOSIS — G8929 Other chronic pain: Secondary | ICD-10-CM | POA: Diagnosis not present

## 2023-01-23 DIAGNOSIS — M25561 Pain in right knee: Secondary | ICD-10-CM

## 2023-01-23 NOTE — Progress Notes (Signed)
 My knee is better.  She has less swelling and pain of the right knee after the injection last time.  She is moving about better.  She has developed blindness.  I have given tips to her and to contact the [employer] and her [place]  [place].  Right knee has slight effusion, ROM is full, Crepitus is present, NV intact.  Stable, no distal edema.  Encounter Diagnoses  Name Primary?   Chronic pain of right knee Yes   Category 3 blindness of both eyes    Return prn.  Call if any problem.  Precautions discussed.  Electronically Signed Lemond Stable, MD 1/8/20259:27 AM

## 2023-01-26 DIAGNOSIS — I5021 Acute systolic (congestive) heart failure: Secondary | ICD-10-CM | POA: Diagnosis not present

## 2023-01-26 DIAGNOSIS — J449 Chronic obstructive pulmonary disease, unspecified: Secondary | ICD-10-CM | POA: Diagnosis not present

## 2023-01-30 ENCOUNTER — Ambulatory Visit: Payer: 59 | Attending: Cardiology | Admitting: Cardiology

## 2023-01-30 ENCOUNTER — Encounter: Payer: Self-pay | Admitting: Cardiology

## 2023-01-30 VITALS — BP 120/54 | HR 75 | Ht 65.0 in | Wt 192.4 lb

## 2023-01-30 DIAGNOSIS — I2510 Atherosclerotic heart disease of native coronary artery without angina pectoris: Secondary | ICD-10-CM | POA: Diagnosis not present

## 2023-01-30 DIAGNOSIS — E782 Mixed hyperlipidemia: Secondary | ICD-10-CM

## 2023-01-30 DIAGNOSIS — I10 Essential (primary) hypertension: Secondary | ICD-10-CM

## 2023-01-30 DIAGNOSIS — I5032 Chronic diastolic (congestive) heart failure: Secondary | ICD-10-CM | POA: Diagnosis not present

## 2023-01-30 DIAGNOSIS — I4819 Other persistent atrial fibrillation: Secondary | ICD-10-CM | POA: Diagnosis not present

## 2023-01-30 MED ORDER — ROSUVASTATIN CALCIUM 20 MG PO TABS: 20.0000 mg | ORAL_TABLET | Freq: Every day | ORAL | 3 refills | Status: DC

## 2023-01-30 NOTE — Progress Notes (Signed)
 Clinical Summary [name] is a 78 y.o.female seen today for follow up of the following medical problems.   1. CAD - noted on CT 09/2017, incidental findings. Multivessel and LM disease - can have some chest pain. Sharp pain left side, 8/10 in severity. Can occur any time. No SOB. Lasts a few seconds - had breast cancer in that area, lumpectomy - occasional DOE with activities   CAD risk factors: +smoker roughly 50 years, HL, HTN  11/2017 nuclear stress: no ischemia - denies any chest pains  2.HFimpEF -03/2022 echo: LVEF 35-40%. This was in the setting of pneumonia, COPD exacerbation in elderly frail patient. Did not pursue ischemic testing at the time.  - also new diagnosis of afib with RVR at the time, possibly tachy mediated CM -07/2022 echo LVEF 55-60%  - no SOB/DOE, no recent edema.  - compliant with meds  3. Afib - new diagnosis during 03/2022 admission in setting of pneumonia, COPD exacerbation - started on amio during that admission - was back in SR at 05/2022 f/u appt - tremor, some vision changes. There has been some question if amio related, amio has been stopped - tremor improving off amoidarone.  - no bleeding on eliquis .   4. HTN - compliant with meds  5. HLD 11/2022 TC 208 TG 129 HDL 88 LDL 98 - she is on crestor  10mg  daily.   Past Medical History:  Diagnosis Date   Aortic atherosclerosis (HCC)    Breast cancer (HCC) 2007   Stage I (T1b N0 M0), grade 1 well-differentiated carcinoma of the left breast status, post lumpectomy followed by radiation therapy. Her estrogen receptor receptors were 93%, progesterone receptors 67%. HER-2/neu was negative. No lymphovascular space invasion was seen. All margins were clear. Ki-67 marker was low at 1% with surgery on 11/15/2004. Treated then with post-lumpectomy radiation, finish   Breast cancer, left breast (HCC) 2007   COPD (chronic obstructive pulmonary disease) (HCC)    Coronary artery calcification seen on CAT  scan    Diabetes mellitus without complication (HCC)    diet controlled   Diverticula of colon    Hx of rickettsial disease    Hyperlipidemia    Hypertension    Kidney stones    Nicotine  dependence    Osteoarthritis      Allergies  Allergen Reactions   Penicillins Hives, Shortness Of Breath and Swelling    Has patient had a PCN reaction causing immediate rash, facial/tongue/throat swelling, SOB or lightheadedness with hypotension: yes Has patient had a PCN reaction causing severe rash involving mucus membranes or skin necrosis:no Has patient had a PCN reaction that required hospitalization: yes Has patient had a PCN reaction occurring within the last 10 years: no If all of the above answers are "NO", then may proceed with Cephalosporin use. Tolerates rocephin /keflex    Sulfonamide Derivatives Other (See Comments)    Shaking all over, seizure like symptoms. Hospitalization resulted    Avapro  [Irbesartan ] Other (See Comments)    Pt associates avapro  with bruising though aware scientifically not the case, wants to chanmge med   Levaquin  [Levofloxacin ] Itching     Current Outpatient Medications  Medication Sig Dispense Refill   acetaminophen  (TYLENOL ) 325 MG tablet Take 650 mg by mouth every 6 (six) hours as needed for moderate pain.      albuterol  (PROVENTIL ) (2.5 MG/3ML) 0.083% nebulizer solution INHALE ONE VIAL VIA NEBULIZER THREE TIMES DAILY. (Patient taking differently: Take 2.5 mg by nebulization 3 (three) times daily  as needed for wheezing or shortness of breath.) 300 mL 0   albuterol  (VENTOLIN  HFA) 108 (90 Base) MCG/ACT inhaler INHALE (2) PUFFS EVERY FOURHOURS AS NEEDED ONLY IF YOU CANT CATCH YOUR BREATH (Patient taking differently: Inhale 2 puffs into the lungs every 4 (four) hours as needed for wheezing or shortness of breath. INHALE (2) PUFFS EVERY FOURHOURS AS NEEDED ONLY IF YOU CANT CATCH YOUR BREATH) 8.5 g 0   amiodarone  (PACERONE ) 200 MG tablet Take 0.5 tablets (100 mg  total) by mouth daily. 45 tablet 3   ATROVENT  HFA 17 MCG/ACT inhaler INHALE 2 PUFFS BY MOUTH FOUR TIMES A DAY. 12.9 g 0   bisoprolol  (ZEBETA ) 5 MG tablet Take 1 tablet (5 mg total) by mouth daily. 30 tablet 5   Calcium  Carbonate-Vitamin D  (CALCIUM  600 + D PO) Take 1 tablet by mouth 2 (two) times daily.     Cyanocobalamin  (VITAMIN B 12) 500 MCG TABS Take 500 mcg by mouth daily. Take one tablet by mouth once daily 90 tablet 3   cyclobenzaprine  (FLEXERIL ) 5 MG tablet Take 1 tablet (5 mg total) by mouth at bedtime. 30 tablet 5   ELIQUIS  5 MG TABS tablet TAKE 1 TABLET BY MOUTH TWICE A DAY 60 tablet 5   ENTRESTO  24-26 MG Take 1 tablet by mouth 2 (two) times daily. 60 tablet 5   erythromycin  ophthalmic ointment Place 1 Application into both eyes at bedtime. Apply daily at bedtime to both eyes for 1 week 3.5 g 0   FARXIGA  10 MG TABS tablet Take 1 tablet (10 mg total) by mouth daily. 30 tablet 5   fluticasone  (FLONASE ) 50 MCG/ACT nasal spray PLACE 2 SPRAYS IN EACH NOSTRIL DAILY. 16 g 5   hydrOXYzine  (ATARAX ) 25 MG tablet Take 1 tablet (25 mg total) by mouth 2 (two) times daily as needed for itching.     Misc. Devices MISC Please provide patient with mastectomy bra and prosthesis. Dx: History of left breast cancer 1 each 0   Naphazoline HCl (CLEAR EYES OP) Place 1 drop into both eyes daily as needed (irritation).     pantoprazole  (PROTONIX ) 40 MG tablet Take 1 tablet (40 mg total) by mouth every morning. 90 tablet 0   polyethylene glycol powder (GLYCOLAX /MIRALAX ) powder MIX 1 CAPFUL IN 8 OUNCES OF JUICE OR WATER  AND DRINK ONCE DAILY. (Patient taking differently: Take 17 g by mouth daily.) 527 g 5   rosuvastatin  (CRESTOR ) 10 MG tablet TAKE 1 TABLET BY MOUTH ONCE A DAY. 90 tablet 0   spironolactone  (ALDACTONE ) 25 MG tablet Take 1 tablet (25 mg total) by mouth daily. 30 tablet 0   SYMBICORT  160-4.5 MCG/ACT inhaler Inhale 2 puffs into the lungs 2 (two) times daily. 10.2 g 0   traMADol  (ULTRAM ) 50 MG tablet TAKE  (1) TABLET BY MOUTH EVERY 8 HOURS AS NEEDED. 60 tablet 3   No current facility-administered medications for this visit.     Past Surgical History:  Procedure Laterality Date   ABDOMINAL HYSTERECTOMY     BREAST SURGERY  2005 approx   left lumpectomy    CATARACT EXTRACTION W/PHACO Left 05/01/2019   Procedure: CATARACT EXTRACTION PHACO AND INTRAOCULAR LENS PLACEMENT LEFT EYE CDE=11.73;  Surgeon: Tarri Farm, MD;  Location: [facility];  Service: Ophthalmology;  Laterality: Left;  left   CATARACT EXTRACTION W/PHACO Right 05/15/2019   Procedure: CATARACT EXTRACTION PHACO AND INTRAOCULAR LENS PLACEMENT RIGHT EYE;  Surgeon: Tarri Farm, MD;  Location: [facility];  Service: Ophthalmology;  Laterality: Right;  CDE: 16.07   CHOLECYSTECTOMY N/A 05/26/2014   Procedure: LAPAROSCOPIC CHOLECYSTECTOMY;  Surgeon: Alanda Allegra Md, MD;  Location: [facility];  Service: General;  Laterality: N/A;   COLECTOMY     2005, diverticulitis   COLONOSCOPY N/A 05/20/2017    Surgeon: Alyce Jubilee, MD;  five 3-6 mm polyps (4 tubular adenomas, 1 hyperplastic polyp), tortuous left colon, internal hemorrhoids.  Recommended repeat colonoscopy in 3 years.   COLONOSCOPY WITH PROPOFOL  N/A 07/12/2020   Procedure: COLONOSCOPY WITH PROPOFOL ;  Surgeon: Vinetta Greening, DO;  Location: [facility];  Service: Endoscopy;  Laterality: N/A;  12:30pm   DILATION AND CURETTAGE OF UTERUS     ESOPHAGOGASTRODUODENOSCOPY N/A 05/20/2017   Surgeon: Alyce Jubilee, MD; normal esophagus and mild gastritis due to aspirin .  Biopsies negative for H. pylori.   left breast      cancer, in 2006   POLYPECTOMY  07/12/2020   Procedure: POLYPECTOMY;  Surgeon: Vinetta Greening, DO;  Location: [facility];  Service: Endoscopy;;   TUBAL LIGATION       Allergies  Allergen Reactions   Penicillins Hives, Shortness Of Breath and Swelling    Has patient had a PCN reaction causing immediate rash, facial/tongue/throat swelling, SOB or lightheadedness with  hypotension: yes Has patient had a PCN reaction causing severe rash involving mucus membranes or skin necrosis:no Has patient had a PCN reaction that required hospitalization: yes Has patient had a PCN reaction occurring within the last 10 years: no If all of the above answers are "NO", then may proceed with Cephalosporin use. Tolerates rocephin /keflex    Sulfonamide Derivatives Other (See Comments)    Shaking all over, seizure like symptoms. Hospitalization resulted    Avapro  [Irbesartan ] Other (See Comments)    Pt associates avapro  with bruising though aware scientifically not the case, wants to chanmge med   Levaquin  [Levofloxacin ] Itching      Family History  Problem Relation Age of Onset   Breast cancer Mother    COPD Father    Emphysema Father    Throat cancer Sister    Glaucoma Brother    Schizophrenia Brother    Heart attack Brother    Lung cancer Sister        former smoker   Hepatitis C Daughter    Seizures Daughter    SIDS Son    Colon cancer Neg Hx    Colon polyps Neg Hx      Social History [name] reports that she quit smoking about 9 months ago. Her smoking use included cigarettes. She started smoking about 50 years ago. She has a 25 pack-year smoking history. She has never used smokeless tobacco. [name] reports no history of alcohol use.   Review of Systems CONSTITUTIONAL: No weight loss, fever, chills, weakness or fatigue.  HEENT: Eyes: No visual loss, blurred vision, double vision or yellow sclerae.No hearing loss, sneezing, congestion, runny nose or sore throat.  SKIN: No rash or itching.  CARDIOVASCULAR: per hpi RESPIRATORY: No shortness of breath, cough or sputum.  GASTROINTESTINAL: No anorexia, nausea, vomiting or diarrhea. No abdominal pain or blood.  GENITOURINARY: No burning on urination, no polyuria NEUROLOGICAL: No headache, dizziness, syncope, paralysis, ataxia, numbness or tingling in the extremities. No change in bowel or bladder control.   MUSCULOSKELETAL: No muscle, back pain, joint pain or stiffness.  LYMPHATICS: No enlarged nodes. No history of splenectomy.  PSYCHIATRIC: No history of depression or anxiety.  ENDOCRINOLOGIC: No reports of sweating, cold or heat intolerance.  No polyuria or polydipsia.  Aaron Aas   Physical Examination Today's Vitals   01/30/23 1136  BP: (!) 120/54  Pulse: 75  SpO2: 97%  Weight: 192 lb 6.4 oz (87.3 kg)  Height: 5\' 5"  (1.651 m)   Body mass index is 32.02 kg/m.  Gen: resting comfortably, no acute distress HEENT: no scleral icterus, pupils equal round and reactive, no palptable cervical adenopathy,  CV: RRR, no mrg, no vjd Resp: Clear to auscultation bilaterally GI: abdomen is soft, non-tender, non-distended, normal bowel sounds, no hepatosplenomegaly MSK: extremities are warm, no edema.  Skin: warm, no rash Neuro:  no focal deficits Psych: appropriate affect   Diagnostic Studies  11/2017 nuclear stress There was no ST segment deviation noted during stress. This is a low risk study. Very mild apical defect likely due to apical thinning, less likely to be very mild apical ischemia. Either finding supports low risk. The left ventricular ejection fraction is normal (55-65%). The study is normal.    03/2022 echo 1. Left ventricular ejection fraction, by estimation, is 35 to 40%. The  left ventricle has moderately decreased function. The left ventricle  demonstrates regional wall motion abnormalities (see scoring  diagram/findings for description). There is mild  concentric left ventricular hypertrophy. Left ventricular diastolic  parameters are indeterminate.   2. Right ventricular systolic function is normal. The right ventricular  size is normal. Tricuspid regurgitation signal is inadequate for assessing  PA pressure.   3. The mitral valve is grossly normal. Trivial mitral valve  regurgitation.   4. The aortic valve is tricuspid. There is mild calcification of the  aortic  valve. Aortic valve regurgitation is not visualized. Aortic valve  sclerosis is present, with no evidence of aortic valve stenosis.   5. The inferior vena cava is normal in size with greater than 50%  respiratory variability, suggesting right atrial pressure of 3 mmHg.    07/2022 echo limited 1. Limited study.   2. Left ventricular ejection fraction, by estimation, is 55 to 60%. The  left ventricle has normal function. The left ventricle demonstrates  regional wall motion abnormalities (see scoring diagram/findings for  description).   3. Right ventricular systolic function is normal. The right ventricular  size is normal.   4. The mitral valve is grossly normal. No evidence of mitral valve  regurgitation.   5. The aortic valve is tricuspid. There is mild calcification of the  aortic valve. Aortic valve regurgitation is not visualized. Aortic valve  sclerosis is present, with no evidence of aortic valve stenosis.   6. The inferior vena cava is normal in size with greater than 50%  respiratory variability, suggesting right atrial pressure of 3 mmHg.     Assessment and Plan   1. CAD - incidental findings on CT chest. Imaging suggets LM and multivessel disease. She has atypical chest pain at the site of her previous lumpectomy. - stress imaging was benign - no symptoms, continue to monitor  2. Afib/acquired thrombophilia - EKG today shows she is in SR,  no recent symptoms - tremor and vision changes on amio, requesting offical records from her eye doctor - amio stopped, continue bisoprolol . Continue eliquis  for stroke prevention  3.HLD - LDL above goal, increase crestor  to 20mg  daily   4. HTN - at goal, continue current meds  Laurann Pollock, M.D.

## 2023-01-30 NOTE — Patient Instructions (Signed)
 Medication Instructions:  Your physician has recommended you make the following change in your medication:  -Stop Amiodarone  -Increase Crestor  to 20 mg tablet once daily.   *If you need a refill on your cardiac medications before your next appointment, please call your pharmacy*   Lab Work: None If you have labs (blood work) drawn today and your tests are completely normal, you will receive your results only by: MyChart Message (if you have MyChart) OR A paper copy in the mail If you have any lab test that is abnormal or we need to change your treatment, we will call you to review the results.   Testing/Procedures: Noen   Follow-Up: At [facility], you and your health needs are our priority.  As part of our continuing mission to provide you with exceptional heart care, we have created designated Provider Care Teams.  These Care Teams include your primary Cardiologist (physician) and Advanced Practice Providers (APPs -  Physician Assistants and Nurse Practitioners) who all work together to provide you with the care you need, when you need it.  We recommend signing up for the patient portal called "MyChart".  Sign up information is provided on this After Visit Summary.  MyChart is used to connect with patients for Virtual Visits (Telemedicine).  Patients are able to view lab/test results, encounter notes, upcoming appointments, etc.  Non-urgent messages can be sent to your provider as well.   To learn more about what you can do with MyChart, go to [email or web address].    Your next appointment:   4 month(s)  Provider:   You may see Armida Lander, MD or one of the following Advanced Practice Providers on your designated Care Team:   Woodfin Hays, PA-C  Theotis Flake, [place]     Other Instructions

## 2023-02-01 DIAGNOSIS — H26493 Other secondary cataract, bilateral: Secondary | ICD-10-CM | POA: Diagnosis not present

## 2023-02-01 DIAGNOSIS — Z961 Presence of intraocular lens: Secondary | ICD-10-CM | POA: Diagnosis not present

## 2023-02-01 DIAGNOSIS — J9601 Acute respiratory failure with hypoxia: Secondary | ICD-10-CM | POA: Diagnosis not present

## 2023-02-01 DIAGNOSIS — H4710 Unspecified papilledema: Secondary | ICD-10-CM | POA: Diagnosis not present

## 2023-02-01 DIAGNOSIS — J441 Chronic obstructive pulmonary disease with (acute) exacerbation: Secondary | ICD-10-CM | POA: Diagnosis not present

## 2023-02-01 DIAGNOSIS — H53133 Sudden visual loss, bilateral: Secondary | ICD-10-CM | POA: Diagnosis not present

## 2023-02-01 DIAGNOSIS — I5021 Acute systolic (congestive) heart failure: Secondary | ICD-10-CM | POA: Diagnosis not present

## 2023-02-01 DIAGNOSIS — H47293 Other optic atrophy, bilateral: Secondary | ICD-10-CM | POA: Diagnosis not present

## 2023-02-01 DIAGNOSIS — M5410 Radiculopathy, site unspecified: Secondary | ICD-10-CM | POA: Diagnosis not present

## 2023-02-05 ENCOUNTER — Other Ambulatory Visit (HOSPITAL_COMMUNITY): Payer: Self-pay | Admitting: Physician Assistant

## 2023-02-05 DIAGNOSIS — R519 Headache, unspecified: Secondary | ICD-10-CM | POA: Diagnosis not present

## 2023-02-05 DIAGNOSIS — M316 Other giant cell arteritis: Secondary | ICD-10-CM | POA: Diagnosis not present

## 2023-02-05 DIAGNOSIS — H4710 Unspecified papilledema: Secondary | ICD-10-CM

## 2023-02-05 DIAGNOSIS — H53139 Sudden visual loss, unspecified eye: Secondary | ICD-10-CM | POA: Diagnosis not present

## 2023-02-06 NOTE — Telephone Encounter (Unsigned)
Copied from CRM [id number]. Topic: General - Other >> Feb 05, 2023  4:27 PM Elle L wrote: Reason for CRM: Aram Beecham from [facility] was calling to report a STAT lab but disconnected the line while on hold with CAL and CAL advised me to send a CRM. Their number is [phone].

## 2023-02-08 ENCOUNTER — Other Ambulatory Visit: Payer: Self-pay | Admitting: Family Medicine

## 2023-02-08 DIAGNOSIS — J4489 Other specified chronic obstructive pulmonary disease: Secondary | ICD-10-CM

## 2023-02-08 DIAGNOSIS — R918 Other nonspecific abnormal finding of lung field: Secondary | ICD-10-CM

## 2023-02-11 ENCOUNTER — Ambulatory Visit (HOSPITAL_COMMUNITY)
Admission: RE | Admit: 2023-02-11 | Discharge: 2023-02-11 | Disposition: A | Discharge status: Home / Self Care | Payer: 59 | Source: Ambulatory Visit | Attending: Physician Assistant | Admitting: Physician Assistant

## 2023-02-11 DIAGNOSIS — H4710 Unspecified papilledema: Secondary | ICD-10-CM | POA: Diagnosis not present

## 2023-02-11 DIAGNOSIS — I6782 Cerebral ischemia: Secondary | ICD-10-CM | POA: Diagnosis not present

## 2023-02-11 MED ORDER — GADOBUTROL 1 MMOL/ML IV SOLN
7.5000 mL | Freq: Once | INTRAVENOUS | Status: AC | PRN
  Administered 2023-02-11: 7.5 mL via INTRAVENOUS

## 2023-02-15 ENCOUNTER — Other Ambulatory Visit: Payer: Self-pay | Admitting: Internal Medicine

## 2023-02-19 ENCOUNTER — Ambulatory Visit (INDEPENDENT_AMBULATORY_CARE_PROVIDER_SITE_OTHER): Payer: 59 | Admitting: Internal Medicine

## 2023-02-19 ENCOUNTER — Encounter: Payer: Self-pay | Admitting: Internal Medicine

## 2023-02-19 ENCOUNTER — Ambulatory Visit: Payer: Self-pay | Admitting: Family Medicine

## 2023-02-19 VITALS — BP 154/82 | HR 66

## 2023-02-19 DIAGNOSIS — M25471 Effusion, right ankle: Secondary | ICD-10-CM

## 2023-02-19 DIAGNOSIS — I5020 Unspecified systolic (congestive) heart failure: Secondary | ICD-10-CM | POA: Diagnosis not present

## 2023-02-19 DIAGNOSIS — M25472 Effusion, left ankle: Secondary | ICD-10-CM | POA: Insufficient documentation

## 2023-02-19 MED ORDER — FUROSEMIDE 20 MG PO TABS: 20.0000 mg | ORAL_TABLET | Freq: Every day | ORAL | 3 refills | Status: DC

## 2023-02-19 NOTE — Progress Notes (Signed)
 Acute Office Visit  Subjective:     Patient ID: [name], female    DOB: Nov 22, 1945, 78 y.o.   MRN: [id number]  Chief Complaint  Patient presents with   Leg Swelling    Bilateral feet swelling for one week    [name] presents today for an acute visit endorsing 1 week history of bilateral feet and ankle swelling.  No recent medication changes.  Denies orthopnea/PND and dyspnea with exertion.  Denies pain in her legs.  She is concerned that symptoms will progress and she will end up admitted to the hospital.  Known history of HFrEF with EF recovered on latest TTE from July 2024.  Recently seen by cardiology for follow-up.  She endorses compliance with her currently prescribed GDMT regimen.  Review of Systems  Cardiovascular:  Positive for leg swelling (bilateral foot and ankle swelling).  All other systems reviewed and are negative.     Objective:    BP (!) 154/82 (BP Location: Right Arm, Patient Position: Sitting, Cuff Size: Large)   Pulse 66   SpO2 91%  BP Readings from Last 3 Encounters:  02/19/23 (!) 154/82  01/30/23 (!) 120/54  01/23/23 110/68   Physical Exam Vitals reviewed.  Constitutional:      Appearance: She is obese.     Comments: Examined in wheelchair  HENT:     Head: Normocephalic and atraumatic.     Right Ear: External ear normal.     Left Ear: External ear normal.     Nose: Nose normal. No congestion or rhinorrhea.     Mouth/Throat:     Mouth: Mucous membranes are moist.     Pharynx: Oropharynx is clear. No oropharyngeal exudate or posterior oropharyngeal erythema.  Eyes:     Extraocular Movements: Extraocular movements intact.     Conjunctiva/sclera: Conjunctivae normal.     Pupils: Pupils are equal, round, and reactive to light.  Cardiovascular:     Rate and Rhythm: Normal rate and regular rhythm.     Pulses: Normal pulses.     Heart sounds: Normal heart sounds. No murmur heard. Pulmonary:     Effort: Pulmonary effort is normal.     Breath  sounds: Normal breath sounds. No wheezing, rhonchi or rales.  Abdominal:     General: Bowel sounds are normal.     Palpations: Abdomen is soft.     Tenderness: There is no abdominal tenderness.  Musculoskeletal:        General: Swelling (1+ bilateral ankle and foot edema terminates proxmially below the shins) present.     Cervical back: Normal range of motion.  Skin:    General: Skin is warm and dry.     Capillary Refill: Capillary refill takes less than 2 seconds.  Neurological:     General: No focal deficit present.     Mental Status: She is oriented to person, place, and time.  Psychiatric:        Mood and Affect: Mood normal.        Behavior: Behavior normal.       Assessment & Plan:   Problem List Items Addressed This Visit       Ankle edema, bilateral   Presenting today for an acute visit endorsing 1 week history of bilateral foot and ankle edema.  On exam there is 1+ pitting edema present in the ankles and feet.  Edema radiates proximally to the shins.  Denies orthopnea/PND and dyspnea with exertion.  Noted history of HFrEF with  EF recovered on latest TTE from July 2024.  Pulmonary exam is unremarkable.  Noted history of atrial fibrillation.  Amiodarone  recently discontinued due to concern for adverse side effects.  Remains on bisoprolol .  ECG obtained today shows regular rate and rhythm.  She endorses compliance with her currently prescribed GDMT regimen. -Resume Lasix  20 mg daily as needed for relief of lower extremity edema.  Patient was instructed to take 40 mg daily today and tomorrow, 20 mg daily for the following 2 days, and then use as needed.  Repeat labs ordered today.  Will arrange 2-week follow-up with Dr. Antonetta for reassessment.       Meds ordered this encounter  Medications   furosemide  (LASIX ) 20 MG tablet    Sig: Take 1 tablet (20 mg total) by mouth daily.    Dispense:  30 tablet    Refill:  3    Return in about 2 weeks (around 03/05/2023).  Manus FORBES Fireman, MD

## 2023-02-19 NOTE — Patient Instructions (Signed)
 It was a pleasure to see you today.  Thank you for giving us  the opportunity to be involved in your care.  Below is a brief recap of your visit and next steps.  We will plan to see you again in 2 weeks.   Summary Resume lasix  20 mg daily as needed for lower extremity swelling. Take 40 mg (2 tablets) today and tomorrow, then 20 mg daily for the next 2 days, then 20 mg daily as needed for swelling relief.  Repeat labs and ECG ordered Follow up with Dr. Antonetta in 2 weeks

## 2023-02-19 NOTE — Assessment & Plan Note (Signed)
 Presenting today for an acute visit endorsing 1 week history of bilateral foot and ankle edema.  On exam there is 1+ pitting edema present in the ankles and feet.  Edema radiates proximally to the shins.  Denies orthopnea/PND and dyspnea with exertion.  Noted history of HFrEF with EF recovered on latest TTE from July 2024.  Pulmonary exam is unremarkable.  Noted history of atrial fibrillation.  Amiodarone  recently discontinued due to concern for adverse side effects.  Remains on bisoprolol .  ECG obtained today shows regular rate and rhythm.  She endorses compliance with her currently prescribed GDMT regimen. -Resume Lasix  20 mg daily as needed for relief of lower extremity edema.  Patient was instructed to take 40 mg daily today and tomorrow, 20 mg daily for the following 2 days, and then use as needed.  Repeat labs ordered today.  Will arrange 2-week follow-up with Dr. Antonetta for reassessment.

## 2023-02-19 NOTE — Telephone Encounter (Signed)
  Chief Complaint: BLE swelling Symptoms: swelling Frequency: 1 week Pertinent Negatives: Patient denies CP, SOB, redness, warmth Disposition: [] ED /[] Urgent Care (no appt availability in office) / [x] Appointment(In office/virtual)/ []  [facility]/ [] Home Care/ [] Refused Recommended Disposition /[] [place] [facility]/ []  Follow-up with PCP Additional Notes: Patient's granddaughter, Alan on [place], calls reporting worsening BLE edema. States patient was taken off amiodarone  mid January and now has noticed the swelling of both lower extremities to above ankles the last week. Denies numbness or tingling, redness. Per protocol, patient to be evaluated within 3 days. Next available with PCP 02/27/23. Scheduled with alternate provider in office for today at 1540 per caller request. Care advice reviewed, caller verbalized understanding. Alerting PCP for review.   Copied from CRM [id number]. Topic: Clinical - Red Word Triage >> Feb 19, 2023  1:50 PM Bascom RAMAN wrote: [facility] that prompted transfer to Nurse Triage: Alan [place], provider stopped heart medication because of blindness. feet and ankles swollen for about a week. callback [phone] Reason for Disposition  [1] MILD swelling of both ankles (i.e., pedal edema) AND [2] new-onset or worsening  Answer Assessment - Initial Assessment Questions 1. ONSET: When did the swelling start? (e.g., minutes, hours, days)     1 week 2. LOCATION: What part of the leg is swollen?  Are both legs swollen or just one leg?     Both legs, feet and just above ankles 3. SEVERITY: How bad is the swelling? (e.g., localized; mild, moderate, severe)   - Localized: Small area of swelling localized to one leg.   - MILD pedal edema: Swelling limited to foot and ankle, pitting edema < 1/4 inch (6 mm) deep, rest and elevation eliminate most or all swelling.   - MODERATE edema: Swelling of lower leg to knee, pitting edema > 1/4 inch (6 mm) deep, rest  and elevation only partially reduce swelling.   - SEVERE edema: Swelling extends above knee, facial or hand swelling present.      Mild 4. REDNESS: Does the swelling look red or infected?     Denies 5. PAIN: Is the swelling painful to touch? If Yes, ask: How painful is it?   (Scale 1-10; mild, moderate or severe)     Denies 6. FEVER: Do you have a fever? If Yes, ask: What is it, how was it measured, and when did it start?      Denies 7. CAUSE: What do you think is causing the leg swelling?     Stopped amiodarone  around mid January 8. MEDICAL HISTORY: Do you have a history of blood clots (e.g., DVT), cancer, heart failure, kidney disease, or liver failure?     Denies 9. RECURRENT SYMPTOM: Have you had leg swelling before? If Yes, ask: When was the last time? What happened that time?     Yes, when she was inpatient last April.  10. OTHER SYMPTOMS: Do you have any other symptoms? (e.g., chest pain, difficulty breathing)       SOB with long walks due to COPD  Protocols used: Leg Swelling and Edema-A-AH

## 2023-02-20 LAB — CBC WITH DIFFERENTIAL/PLATELET
Basophils Absolute: 0.2 10*3/uL (ref 0.0–0.2)
Basos: 2 %
EOS (ABSOLUTE): 0.3 10*3/uL (ref 0.0–0.4)
Eos: 4 %
Hematocrit: 41.9 % (ref 34.0–46.6)
Hemoglobin: 13.6 g/dL (ref 11.1–15.9)
Immature Grans (Abs): 0 10*3/uL (ref 0.0–0.1)
Immature Granulocytes: 1 %
Lymphocytes Absolute: 1.8 10*3/uL (ref 0.7–3.1)
Lymphs: 21 %
MCH: 29 pg (ref 26.6–33.0)
MCHC: 32.5 g/dL (ref 31.5–35.7)
MCV: 89 fL (ref 79–97)
Monocytes Absolute: 1.1 10*3/uL — ABNORMAL HIGH (ref 0.1–0.9)
Monocytes: 13 %
Neutrophils Absolute: 5 10*3/uL (ref 1.4–7.0)
Neutrophils: 59 %
Platelets: 547 10*3/uL — ABNORMAL HIGH (ref 150–450)
RBC: 4.69 x10E6/uL (ref 3.77–5.28)
RDW: 12.7 % (ref 11.7–15.4)
WBC: 8.4 10*3/uL (ref 3.4–10.8)

## 2023-02-20 LAB — BASIC METABOLIC PANEL
BUN/Creatinine Ratio: 10 — ABNORMAL LOW (ref 12–28)
BUN: 15 mg/dL (ref 8–27)
CO2: 24 mmol/L (ref 20–29)
Calcium: 9.8 mg/dL (ref 8.7–10.3)
Chloride: 101 mmol/L (ref 96–106)
Creatinine, Ser: 1.43 mg/dL — ABNORMAL HIGH (ref 0.57–1.00)
Glucose: 118 mg/dL — ABNORMAL HIGH (ref 70–99)
Potassium: 5.2 mmol/L (ref 3.5–5.2)
Sodium: 144 mmol/L (ref 134–144)
eGFR: 38 mL/min/{1.73_m2} — ABNORMAL LOW (ref >59)

## 2023-02-20 LAB — MAGNESIUM: Magnesium: 2.2 mg/dL (ref 1.6–2.3)

## 2023-02-26 ENCOUNTER — Ambulatory Visit (INDEPENDENT_AMBULATORY_CARE_PROVIDER_SITE_OTHER): Payer: 59

## 2023-02-26 VITALS — Ht 65.0 in | Wt 192.0 lb

## 2023-02-26 DIAGNOSIS — Z0000 Encounter for general adult medical examination without abnormal findings: Secondary | ICD-10-CM | POA: Diagnosis not present

## 2023-02-26 DIAGNOSIS — I5021 Acute systolic (congestive) heart failure: Secondary | ICD-10-CM | POA: Diagnosis not present

## 2023-02-26 DIAGNOSIS — Z780 Asymptomatic menopausal state: Secondary | ICD-10-CM

## 2023-02-26 DIAGNOSIS — J449 Chronic obstructive pulmonary disease, unspecified: Secondary | ICD-10-CM | POA: Diagnosis not present

## 2023-02-26 NOTE — Progress Notes (Signed)
Because this visit was a virtual/telehealth visit,  certain criteria was not obtained, such a blood pressure, CBG if applicable, and timed get up and go. Any medications not marked as "taking" were not mentioned during the medication reconciliation part of the visit. Any vitals not documented were not able to be obtained due to this being a telehealth visit or patient was unable to self-report a recent blood pressure reading due to a lack of equipment at home via telehealth. Vitals that have been documented are verbally provided by the patient.  Interactive audio and video telecommunications were attempted between this provider and patient, however failed, due to patient having technical difficulties OR patient did not have access to video capability.  We continued and completed visit with audio only.  Subjective:   [name] is a 78 y.o. female who presents for Medicare Annual (Subsequent) preventive examination.  Visit Complete: Virtual I connected with  [name] on 02/26/23 by a audio enabled telemedicine application and verified that I am speaking with the correct person using two identifiers.  Patient Location: Home  Provider Location: Home Office  I discussed the limitations of evaluation and management by telemedicine. The patient expressed understanding and agreed to proceed.  Vital Signs: Because this visit was a virtual/telehealth visit, some criteria may be missing or patient reported. Any vitals not documented were not able to be obtained and vitals that have been documented are patient reported.  Cardiac Risk Factors include: advanced age (>1men, >35 women);diabetes mellitus;dyslipidemia;hypertension;smoking/ tobacco exposure;sedentary lifestyle     Objective:    Today's Vitals   02/26/23 1030  Weight: 192 lb (87.1 kg)  Height: 5\' 5"  (1.651 m)   Body mass index is 31.95 kg/m.     02/26/2023   10:29 AM 01/15/2023   10:35 AM 10/08/2022    8:23 AM 07/05/2022     9:44 AM 04/06/2022    1:05 PM 02/19/2022   11:02 AM 11/13/2021   11:25 AM  Advanced Directives  Does Patient Have a Medical Advance Directive? No No No Yes No No No  Type of Advance Directive    Living will     Would patient like information on creating a medical advance directive? No - Patient declined No - Patient declined No - Patient declined  No - Patient declined No - Patient declined     Current Medications (verified) Outpatient Encounter Medications as of 02/26/2023  Medication Sig   acetaminophen (TYLENOL) 325 MG tablet Take 650 mg by mouth every 6 (six) hours as needed for moderate pain.    albuterol (PROVENTIL) (2.5 MG/3ML) 0.083% nebulizer solution INHALE ONE VIAL VIA NEBULIZER THREE TIMES DAILY. (Patient taking differently: Take 2.5 mg by nebulization 3 (three) times daily as needed for wheezing or shortness of breath.)   albuterol (VENTOLIN HFA) 108 (90 Base) MCG/ACT inhaler INHALE (2) PUFFS EVERY FOURHOURS AS NEEDED ONLY IF YOU CANT CATCH YOUR BREATH (Patient taking differently: Inhale 2 puffs into the lungs every 4 (four) hours as needed for wheezing or shortness of breath. INHALE (2) PUFFS EVERY FOURHOURS AS NEEDED ONLY IF YOU CANT CATCH YOUR BREATH)   ATROVENT HFA 17 MCG/ACT inhaler INHALE 2 PUFFS BY MOUTH FOUR TIMES A DAY.   bisoprolol (ZEBETA) 5 MG tablet Take 1 tablet (5 mg total) by mouth daily.   Calcium Carbonate-Vitamin D (CALCIUM 600 + D PO) Take 1 tablet by mouth 2 (two) times daily.   Cyanocobalamin (VITAMIN B 12) 500 MCG TABS Take 500 mcg by  mouth daily. Take one tablet by mouth once daily   cyclobenzaprine (FLEXERIL) 5 MG tablet Take 1 tablet (5 mg total) by mouth at bedtime.   ELIQUIS 5 MG TABS tablet TAKE 1 TABLET BY MOUTH TWICE A DAY   ENTRESTO 24-26 MG Take 1 tablet by mouth 2 (two) times daily.   erythromycin ophthalmic ointment Place 1 Application into both eyes at bedtime. Apply daily at bedtime to both eyes for 1 week   FARXIGA 10 MG TABS tablet Take 1 tablet  (10 mg total) by mouth daily.   fluticasone (FLONASE) 50 MCG/ACT nasal spray PLACE 2 SPRAYS IN EACH NOSTRIL DAILY.   furosemide (LASIX) 20 MG tablet Take 1 tablet (20 mg total) by mouth daily.   hydrOXYzine (ATARAX) 25 MG tablet Take 1 tablet (25 mg total) by mouth 2 (two) times daily as needed for itching.   Misc. Devices MISC Please provide patient with mastectomy bra and prosthesis. Dx: History of left breast cancer   Naphazoline HCl (CLEAR EYES OP) Place 1 drop into both eyes daily as needed (irritation).   pantoprazole (PROTONIX) 40 MG tablet Take 1 tablet (40 mg total) by mouth every morning.   polyethylene glycol powder (GLYCOLAX/MIRALAX) powder MIX 1 CAPFUL IN 8 OUNCES OF JUICE OR WATER AND DRINK ONCE DAILY. (Patient taking differently: Take 17 g by mouth daily.)   rosuvastatin (CRESTOR) 20 MG tablet Take 1 tablet (20 mg total) by mouth daily.   spironolactone (ALDACTONE) 25 MG tablet Take 1 tablet (25 mg total) by mouth daily.   SYMBICORT 160-4.5 MCG/ACT inhaler Inhale 2 puffs into the lungs 2 (two) times daily.   traMADol (ULTRAM) 50 MG tablet TAKE (1) TABLET BY MOUTH EVERY 8 HOURS AS NEEDED.   No facility-administered encounter medications on file as of 02/26/2023.    Allergies (verified) Penicillins, Sulfonamide derivatives, Avapro [irbesartan], and Levaquin [levofloxacin]   History: Past Medical History:  Diagnosis Date   Aortic atherosclerosis (HCC)    Breast cancer (HCC) 2007   Stage I (T1b N0 M0), grade 1 well-differentiated carcinoma of the left breast status, post lumpectomy followed by radiation therapy. Her estrogen receptor receptors were 93%, progesterone receptors 67%. HER-2/neu was negative. No lymphovascular space invasion was seen. All margins were clear. Ki-67 marker was low at 1% with surgery on 11/15/2004. Treated then with post-lumpectomy radiation, finish   Breast cancer, left breast (HCC) 2007   COPD (chronic obstructive pulmonary disease) (HCC)    Coronary  artery calcification seen on CAT scan    Diabetes mellitus without complication (HCC)    diet controlled   Diverticula of colon    Hx of rickettsial disease    Hyperlipidemia    Hypertension    Kidney stones    Nicotine dependence    Osteoarthritis    Past Surgical History:  Procedure Laterality Date   ABDOMINAL HYSTERECTOMY     BREAST SURGERY  2005 approx   left lumpectomy    CATARACT EXTRACTION W/PHACO Left 05/01/2019   Procedure: CATARACT EXTRACTION PHACO AND INTRAOCULAR LENS PLACEMENT LEFT EYE CDE=11.73;  Surgeon: Fabio Pierce, MD;  Location: [facility];  Service: Ophthalmology;  Laterality: Left;  left   CATARACT EXTRACTION W/PHACO Right 05/15/2019   Procedure: CATARACT EXTRACTION PHACO AND INTRAOCULAR LENS PLACEMENT RIGHT EYE;  Surgeon: Fabio Pierce, MD;  Location: [facility];  Service: Ophthalmology;  Laterality: Right;  CDE: 16.07   CHOLECYSTECTOMY N/A 05/26/2014   Procedure: LAPAROSCOPIC CHOLECYSTECTOMY;  Surgeon: Franky Macho Md, MD;  Location: [facility];  Service:  General;  Laterality: N/A;   COLECTOMY     2005, diverticulitis   COLONOSCOPY N/A 05/20/2017    Surgeon: West Bali, MD;  five 3-6 mm polyps (4 tubular adenomas, 1 hyperplastic polyp), tortuous left colon, internal hemorrhoids.  Recommended repeat colonoscopy in 3 years.   COLONOSCOPY WITH PROPOFOL N/A 07/12/2020   Procedure: COLONOSCOPY WITH PROPOFOL;  Surgeon: Lanelle Bal, DO;  Location: [facility];  Service: Endoscopy;  Laterality: N/A;  12:30pm   DILATION AND CURETTAGE OF UTERUS     ESOPHAGOGASTRODUODENOSCOPY N/A 05/20/2017   Surgeon: West Bali, MD; normal esophagus and mild gastritis due to aspirin.  Biopsies negative for H. pylori.   left breast      cancer, in 2006   POLYPECTOMY  07/12/2020   Procedure: POLYPECTOMY;  Surgeon: Lanelle Bal, DO;  Location: [facility];  Service: Endoscopy;;   TUBAL LIGATION     Family History  Problem Relation Age of Onset   Breast cancer Mother    COPD  Father    Emphysema Father    Throat cancer Sister    Glaucoma Brother    Schizophrenia Brother    Heart attack Brother    Lung cancer Sister        former smoker   Hepatitis C Daughter    Seizures Daughter    SIDS Son    Colon cancer Neg Hx    Colon polyps Neg Hx    Social History   Socioeconomic History   Marital status: Single    Spouse name: Not on file   Number of children: 5   Years of education: 9th grade    Highest education level: 9th grade  Occupational History   Occupation: retired    Associate Professor: RETIRED  Tobacco Use   Smoking status: Former    Current packs/day: 0.00    Average packs/day: 0.5 packs/day for 50.0 years (25.0 ttl pk-yrs)    Types: Cigarettes    Start date: 04/08/1972    Quit date: 04/09/2022    Years since quitting: 0.8   Smokeless tobacco: Never   Tobacco comments:    smokes 1/2 pack per day 11/19/2019  Vaping Use   Vaping status: Never Used  Substance and Sexual Activity   Alcohol use: No    Alcohol/week: 0.0 standard drinks of alcohol   Drug use: No   Sexual activity: Not Currently  Other Topics Concern   Not on file  Social History Narrative   ** Merged History Encounter ** 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 QUIT WORKING 2003 AFTER CANCER DIAGNOSIS.      MARRIED WITH A RUN AWAY HUSBAND(FUGITIVE 10 YRS). 3 MISCARRIAGES, 5 CHILDREN   Right handed    Social Drivers of Health   Financial Resource Strain: Low Risk  (02/26/2023)   Overall Financial Resource Strain (CARDIA)    Difficulty of Paying Living Expenses: Not hard at all  Food Insecurity: No Food Insecurity (02/26/2023)   Hunger Vital Sign    Worried About Running Out of Food in the Last Year: Never true    Ran Out of Food in the Last Year: Never true  Transportation Needs: No Transportation Needs (02/26/2023)   PRAPARE - Administrator, Civil Service (Medical): No    Lack of Transportation (Non-Medical): No  Physical Activity: Patient  Declined (02/26/2023)   Exercise Vital Sign    Days of Exercise per Week: Patient declined    Minutes of Exercise per Session:  Patient declined  Stress: No Stress Concern Present (02/26/2023)   [employer] of Occupational Health - Occupational Stress Questionnaire    Feeling of Stress : Not at all  Social Connections: Socially Isolated (02/26/2023)   Social Connection and Isolation Panel [NHANES]    Frequency of Communication with Friends and Family: More than three times a week    Frequency of Social Gatherings with Friends and Family: More than three times a week    Attends Religious Services: Never    Database administrator or Organizations: No    Attends Engineer, structural: Never    Marital Status: Divorced    Tobacco Counseling Counseling given: Yes Tobacco comments: smokes 1/2 pack per day 11/19/2019   Clinical Intake:  Pre-visit preparation completed: Yes  Pain : No/denies pain     BMI - recorded: 31.95 Nutritional Status: BMI > 30  Obese Nutritional Risks: None Diabetes: Yes CBG done?: No Did pt. bring in CBG monitor from home?: No  How often do you need to have someone help you when you read instructions, pamphlets, or other written materials from your doctor or pharmacy?: 1 - Never  Interpreter Needed?: No  Information entered by :: Maryjean Ka CMA   Activities of Daily Living    02/26/2023   10:35 AM 04/07/2022   11:12 AM  In your present state of health, do you have any difficulty performing the following activities:  Hearing? 0   Vision? 1   Comment patient lost vision ~ 2 mths ago   Difficulty concentrating or making decisions? 0   Walking or climbing stairs? 0   Dressing or bathing? 1   Doing errands, shopping? 1 0  Comment no longer driving due to lost vision   Preparing Food and eating ? Y   Using the Toilet? N   In the past six months, have you accidently leaked urine? N   Do you have problems with loss of bowel control? N    Managing your Medications? Y   Managing your Finances? Y   Housekeeping or managing your Housekeeping? Y     Patient Care Team: Kerri Perches, MD as PCP - General Branch, Dorothe Pea, MD as PCP - Cardiology (Cardiology) Darreld Mclean, MD as Consulting Physician (Orthopedic Surgery) Galen Manila, Novella Olive, MD (Inactive) as Consulting Physician (Hematology and Oncology) Lanelle Bal, DO as Consulting Physician (Internal Medicine)  Indicate any recent Medical Services you may have received from other than [facility] providers in the past year (date may be approximate).     Assessment:   This is a routine wellness examination for Avanni.  Hearing/Vision screen Hearing Screening - Comments:: Patient denies any hearing difficulties.   Vision Screening - Comments:: Patient has lost vision in both eyes unexpectedly ~ months ago. No dx as of yet. Patient is seeing Dr. Alma Downs     Goals Addressed             This Visit's Progress    Patient Stated       Start eating more fruits and veggies        Depression Screen    02/26/2023   10:36 AM 02/19/2023    3:42 PM 01/11/2023    1:30 PM 12/04/2022   10:59 AM 07/25/2022   11:34 AM 02/19/2022   11:03 AM 02/19/2022   11:02 AM  PHQ 2/9 Scores  PHQ - 2 Score 0 0 0 0 0 0 0  PHQ- 9 Score 0  Fall Risk    02/26/2023   10:35 AM 02/19/2023    3:42 PM 01/15/2023   10:35 AM 01/11/2023    1:30 PM 12/04/2022   10:59 AM  Fall Risk   Falls in the past year? 0 0 0 0 0  Number falls in past yr: 0 0 0 0 0  Injury with Fall? 0 0 0 0 0  Risk for fall due to : No Fall Risks No Fall Risks  No Fall Risks No Fall Risks  Follow up Falls prevention discussed;Falls evaluation completed Falls evaluation completed Falls evaluation completed Falls evaluation completed Falls evaluation completed    MEDICARE RISK AT HOME: Medicare Risk at Home Any stairs in or around the home?: Yes If so, are there any without handrails?: No Home  free of loose throw rugs in walkways, pet beds, electrical cords, etc?: Yes Adequate lighting in your home to reduce risk of falls?: Yes Life alert?: No Use of a cane, walker or w/c?: No Grab bars in the bathroom?: No Shower chair or bench in shower?: Yes Elevated toilet seat or a handicapped toilet?: No  TIMED UP AND GO:  Was the test performed?  No    Cognitive Function:    02/19/2022   11:03 AM  MMSE - Mini Mental State Exam  Not completed: Unable to complete        02/26/2023   10:32 AM 02/19/2022   11:04 AM 02/14/2021    3:10 PM 12/04/2019    9:34 AM 12/03/2018    2:36 PM  6CIT Screen  What Year? 0 points 0 points 0 points 0 points 0 points  What month? 0 points 0 points 0 points 0 points 0 points  What time? 0 points 0 points 0 points 0 points 0 points  Count back from 20 0 points 0 points 0 points 0 points 0 points  Months in reverse 0 points 0 points 2 points 0 points 2 points  Repeat phrase 0 points 0 points 0 points 0 points 4 points  Total Score 0 points 0 points 2 points 0 points 6 points    Immunizations Immunization History  Administered Date(s) Administered   Fluad Quad(high Dose 65+) 09/09/2018, 12/29/2019, 11/24/2020, 10/11/2021   Fluad Trivalent(High Dose 65+) 10/08/2022   Influenza Split 10/31/2011   Influenza Whole 11/18/2008, 10/12/2010   Influenza,inj,Quad PF,6+ Mos 11/12/2012, 09/18/2013, 12/22/2014, 10/04/2015, 09/09/2017   Moderna Covid-19 Vaccine Bivalent Booster 51yrs & up 12/05/2022   Moderna Sars-Covid-2 Vaccination 11/25/2020, 11/09/2021   PFIZER(Purple Top)SARS-COV-2 Vaccination 03/23/2019, 04/22/2019, 01/23/2020   PPD Test 01/28/2012   Pneumococcal Conjugate-13 01/04/2015   Pneumococcal Polysaccharide-23 06/13/2010   Tdap 01/28/2012, 12/05/2022   Zoster Recombinant(Shingrix) 11/04/2017, 01/24/2018    TDAP status: Up to date  Flu Vaccine status: Up to date  Pneumococcal vaccine status: Up to date  Covid-19 vaccine status:  Information provided on how to obtain vaccines.   Qualifies for Shingles Vaccine? No   Zostavax completed No   Shingrix Completed?: Yes  Screening Tests Health Maintenance  Topic Date Due   DEXA SCAN  04/19/2022   COVID-19 Vaccine (7 - 2024-25 season) 01/30/2023   Lung Cancer Screening  07/13/2023   MAMMOGRAM  10/15/2023   Medicare Annual Wellness (AWV)  12/05/2023   DTaP/Tdap/Td (3 - Td or Tdap) 12/04/2032   Pneumonia Vaccine 78+ Years old  Completed   INFLUENZA VACCINE  Completed   Hepatitis C Screening  Completed   Zoster Vaccines- Shingrix  Completed   HPV VACCINES  Aged Out   Colonoscopy  Discontinued    Health Maintenance  Health Maintenance Due  Topic Date Due   DEXA SCAN  04/19/2022   COVID-19 Vaccine (7 - 2024-25 season) 01/30/2023    Colorectal cancer screening: No longer required.   Mammogram status: Completed 10/15/2022. Repeat every year  Bone Density status: Ordered 02/26/2023. Pt provided with contact info and advised to call to schedule appt.  Lung Cancer Screening: (Low Dose CT Chest recommended if Age 7-80 years, 20 pack-year currently smoking OR have quit w/in 15years.) does qualify.   Lung Cancer Screening Referral: Scheduled for screening on 04/08/2023  Additional Screening:  Hepatitis C Screening: does not qualify; Completed   Vision Screening: Recommended annual ophthalmology exams for early detection of glaucoma and other disorders of the eye. Is the patient up to date with their annual eye exam?  Yes  Who is the provider or what is the name of the office in which the patient attends annual eye exams? Dr. Garlon Hatchet  Dental Screening: Recommended annual dental exams for proper oral hygiene  Diabetic Foot Exam: na  Community Resource Referral / Chronic Care Management: CRR required this visit?  No   CCM required this visit?  No     Plan:     I have personally reviewed and noted the following in the patient's chart:    Medical and social history Use of alcohol, tobacco or illicit drugs  Current medications and supplements including opioid prescriptions. Patient is not currently taking opioid prescriptions. Functional ability and status Nutritional status Physical activity Advanced directives List of other physicians Hospitalizations, surgeries, and ER visits in previous 12 months Vitals Screenings to include cognitive, depression, and falls Referrals and appointments  In addition, I have reviewed and discussed with patient certain preventive protocols, quality metrics, and best practice recommendations. A written personalized care plan for preventive services as well as general preventive health recommendations were provided to patient.     Jordan Hawks Keo Schirmer, CMA   02/26/2023   After Visit Summary: (Mail) Due to this being a telephonic visit, the after visit summary with patients personalized plan was offered to patient via mail   Nurse Notes: see routing comment

## 2023-02-26 NOTE — Patient Instructions (Signed)
[name] , Thank you for taking time to come for your Medicare Wellness Visit. I appreciate your ongoing commitment to your health goals. Please review the following plan we discussed and let me know if I can assist you in the future.   Referrals/Orders/Follow-Ups/Clinician Recommendations:  Next Medicare Annual Wellness Visit: March 02, 2024 at 9:20 am telephone visit.   You have an order for:  []   2D Mammogram  [x]   3D Mammogram  []   Bone Density   []   Lung Cancer Screening  Please call for appointment:   [facility] [address] [place], [place] [address] [phone]  Make sure to wear two-piece clothing.  No lotions powders or deodorants the day of the appointment Make sure to bring picture ID and insurance card.  Bring list of medications you are currently taking including any supplements.   Schedule your [facility] screening mammogram through MyChart!   Log into your MyChart account.  Go to 'Visit' (or 'Appointments' if on mobile App) --> Schedule an Appointment  Under 'Select a Reason for Visit' choose the Mammogram Screening option.  Complete the pre-visit questions and select the time and place that best fits your schedule.    This is a list of the screening recommended for you and due dates:  Health Maintenance  Topic Date Due   DEXA scan (bone density measurement)  04/19/2022   COVID-19 Vaccine (7 - 2024-25 season) 01/30/2023   Screening for Lung Cancer  07/13/2023   Mammogram  10/15/2023   Medicare Annual Wellness Visit  02/26/2024   DTaP/Tdap/Td vaccine (3 - Td or Tdap) 12/04/2032   Pneumonia Vaccine  Completed   Flu Shot  Completed   Hepatitis C Screening  Completed   Zoster (Shingles) Vaccine  Completed   HPV Vaccine  Aged Out   Colon Cancer Screening  Discontinued    Advanced directives: (Declined) Advance directive discussed with you today. Even though you declined this today, please call our office  should you change your mind, and we can give you the proper paperwork for you to fill out.  Next Medicare Annual Wellness Visit scheduled for next year: yes  Preventive Care 79 Years and Older, Female Preventive care refers to lifestyle choices and visits with your health care provider that can promote health and wellness. Preventive care visits are also called wellness exams. What can I expect for my preventive care visit? Counseling Your health care provider may ask you questions about your: Medical history, including: Past medical problems. Family medical history. Pregnancy and menstrual history. History of falls. Current health, including: Memory and ability to understand (cognition). Emotional well-being. Home life and relationship well-being. Sexual activity and sexual health. Lifestyle, including: Alcohol, nicotine or tobacco, and drug use. Access to firearms. Diet, exercise, and sleep habits. Work and work Astronomer. Sunscreen use. Safety issues such as seatbelt and bike helmet use. Physical exam Your health care provider will check your: Height and weight. These may be used to calculate your BMI (body mass index). BMI is a measurement that tells if you are at a healthy weight. Waist circumference. This measures the distance around your waistline. This measurement also tells if you are at a healthy weight and may help predict your risk of certain diseases, such as type 2 diabetes and high blood pressure. Heart rate and blood pressure. Body temperature. Skin for abnormal spots. What immunizations do I need?  Vaccines are usually given at various ages, according to a  schedule. Your health care provider will recommend vaccines for you based on your age, medical history, and lifestyle or other factors, such as travel or where you work. What tests do I need? Screening Your health care provider may recommend screening tests for certain conditions. This may include: Lipid  and cholesterol levels. Hepatitis C test. Hepatitis B test. HIV (human immunodeficiency virus) test. STI (sexually transmitted infection) testing, if you are at risk. Lung cancer screening. Colorectal cancer screening. Diabetes screening. This is done by checking your blood sugar (glucose) after you have not eaten for a while (fasting). Mammogram. Talk with your health care provider about how often you should have regular mammograms. BRCA-related cancer screening. This may be done if you have a family history of breast, ovarian, tubal, or peritoneal cancers. Bone density scan. This is done to screen for osteoporosis. Talk with your health care provider about your test results, treatment options, and if necessary, the need for more tests. Follow these instructions at home: Eating and drinking  Eat a diet that includes fresh fruits and vegetables, whole grains, lean protein, and low-fat dairy products. Limit your intake of foods with high amounts of sugar, saturated fats, and salt. Take vitamin and mineral supplements as recommended by your health care provider. Do not drink alcohol if your health care provider tells you not to drink. If you drink alcohol: Limit how much you have to 0-1 drink a day. Know how much alcohol is in your drink. In the [place] one drink equals one 12 oz bottle of beer (355 mL), one 5 oz glass of wine (148 mL), or one 1 oz glass of hard liquor (44 mL). Lifestyle Brush your teeth every morning and night with fluoride toothpaste. Floss one time each day. Exercise for at least 30 minutes 5 or more days each week. Do not use any products that contain nicotine or tobacco. These products include cigarettes, chewing tobacco, and vaping devices, such as e-cigarettes. If you need help quitting, ask your health care provider. Do not use drugs. If you are sexually active, practice safe sex. Use a condom or other form of protection in order to prevent STIs. Take aspirin only as  told by your health care provider. Make sure that you understand how much to take and what form to take. Work with your health care provider to find out whether it is safe and beneficial for you to take aspirin daily. Ask your health care provider if you need to take a cholesterol-lowering medicine (statin). Find healthy ways to manage stress, such as: Meditation, yoga, or listening to music. Journaling. Talking to a trusted person. Spending time with friends and family. Minimize exposure to UV radiation to reduce your risk of skin cancer. Safety Always wear your seat belt while driving or riding in a vehicle. Do not drive: If you have been drinking alcohol. Do not ride with someone who has been drinking. When you are tired or distracted. While texting. If you have been using any mind-altering substances or drugs. Wear a helmet and other protective equipment during sports activities. If you have firearms in your house, make sure you follow all gun safety procedures. What's next? Visit your health care provider once a year for an annual wellness visit. Ask your health care provider how often you should have your eyes and teeth checked. Stay up to date on all vaccines. This information is not intended to replace advice given to you by your health care provider. Make sure you discuss any  questions you have with your health care provider. Document Revised: 06/29/2020 Document Reviewed: 06/29/2020 Elsevier Patient Education  2024 [employer].  Understanding Your Risk for Falls Millions of people have serious injuries from falls each year. It is important to understand your risk of falling. Talk with your health care provider about your risk and what you can do to lower it. If you do have a serious fall, make sure to tell your provider. Falling once raises your risk of falling again. How can falls affect me? Serious injuries from falls are common. These include: Broken bones, such as hip  fractures. Head injuries, such as traumatic brain injuries (TBI) or concussions. A fear of falling can cause you to avoid activities and stay at home. This can make your muscles weaker and raise your risk for a fall. What can increase my risk? There are a number of risk factors that increase your risk for falling. The more risk factors you have, the higher your risk of falling. Serious injuries from a fall happen most often to people who are older than 79 years old. Teenagers and young adults ages 85-29 are also at higher risk. Common risk factors include: Weakness in the lower body. Being generally weak or confused due to long-term (chronic) illness. Dizziness or balance problems. Poor vision. Medicines that cause dizziness or drowsiness. These may include: Medicines for your blood pressure, heart, anxiety, insomnia, or swelling (edema). Pain medicines. Muscle relaxants. Other risk factors include: Drinking alcohol. Having had a fall in the past. Having foot pain or wearing improper footwear. Working at a dangerous job. Having any of the following in your home: Tripping hazards, such as floor clutter or loose rugs. Poor lighting. Pets. Having dementia or memory loss. What actions can I take to lower my risk of falling?     Physical activity Stay physically fit. Do strength and balance exercises. Consider taking a regular class to build strength and balance. Yoga and tai chi are good options. Vision Have your eyes checked every year and your prescription for glasses or contacts updated as needed. Shoes and walking aids Wear non-skid shoes. Wear shoes that have rubber soles and low heels. Do not wear high heels. Do not walk around the house in socks or slippers. Use a cane or walker as told by your provider. Home safety Attach secure railings on both sides of your stairs. Install grab bars for your bathtub, shower, and toilet. Use a non-skid mat in your bathtub or shower.  Attach bath mats securely with double-sided, non-slip rug tape. Use good lighting in all rooms. Keep a flashlight near your bed. Make sure there is a clear path from your bed to the bathroom. Use night-lights. Do not use throw rugs. Make sure all carpeting is taped or tacked down securely. Remove all clutter from walkways and stairways, including extension cords. Repair uneven or broken steps and floors. Avoid walking on icy or slippery surfaces. Walk on the grass instead of on icy or slick sidewalks. Use ice melter to get rid of ice on walkways in the winter. Use a cordless phone. Questions to ask your health care provider Can you help me check my risk for a fall? Do any of my medicines make me more likely to fall? Should I take a vitamin D supplement? What exercises can I do to improve my strength and balance? Should I make an appointment to have my vision checked? Do I need a bone density test to check for weak bones (osteoporosis)?  Would it help to use a cane or a walker? Where to find more information Centers for Disease Control and Prevention, [employer]AD[employer]: TonerPromos.no Community-Based Fall Prevention Programs: TonerPromos.no [employer] on Aging: BaseRingTones.pl Contact a health care provider if: You fall at home. You are afraid of falling at home. You feel weak, drowsy, or dizzy. This information is not intended to replace advice given to you by your health care provider. Make sure you discuss any questions you have with your health care provider. Document Revised: 09/04/2021 Document Reviewed: 09/04/2021 Elsevier Patient Education  2024 [employer].

## 2023-02-27 ENCOUNTER — Ambulatory Visit: Payer: Self-pay | Admitting: Family Medicine

## 2023-02-27 NOTE — Telephone Encounter (Signed)
Copied from CRM [id number]. Topic: Clinical - Red Word Triage >> Feb 27, 2023  8:56 AM Elle L wrote: Red Word that prompted transfer to Nurse Triage: The patient's granddaughter, Margaretmary Dys, called on behalf of the patient as the patient has a headache, cough, runny nose, and a low grade fever that started last night.  Chief Complaint: URI Symptoms: cough, headache, runny nose, fever Frequency: started last night.  Pertinent Negatives: Patient denies difficulty breathing and lung issues. Disposition: [] ED /[] Urgent Care (no appt availability in office) / [x] Appointment(In office/virtual)/ []  [facility]/ [] Home Care/ [] Refused Recommended Disposition /[] [facility]/ []  Follow-up with PCP Additional Notes: apt made for tomorrow; care advice given, instructed to go to the er if becomes worse.  Reason for Disposition  Cough has been present for > 3 weeks  Answer Assessment - Initial Assessment Questions 1. ONSET: "When did the cough begin?"      yesterday 2. SEVERITY: "How bad is the cough today?"      mild 3. SPUTUM: "Describe the color of your sputum" (none, dry cough; clear, white, yellow, green)     Yes, light yellow 4. HEMOPTYSIS: "Are you coughing up any blood?" If so ask: "How much?" (flecks, streaks, tablespoons, etc.)     denies 5. DIFFICULTY BREATHING: "Are you having difficulty breathing?" If Yes, ask: "How bad is it?" (e.g., mild, moderate, severe)    - MILD: No SOB at rest, mild SOB with walking, speaks normally in sentences, can lie down, no retractions, pulse < 100.    - MODERATE: SOB at rest, SOB with minimal exertion and prefers to sit, cannot lie down flat, speaks in phrases, mild retractions, audible wheezing, pulse 100-120.    - SEVERE: Very SOB at rest, speaks in single words, struggling to breathe, sitting hunched forward, retractions, pulse > 120      denies 6. FEVER: "Do you have a fever?" If Yes, ask: "What is your temperature, how was it  measured, and when did it start?"     yes 7. CARDIAC HISTORY: "Do you have any history of heart disease?" (e.g., heart attack, congestive heart failure)      Yes, afib 8. LUNG HISTORY: "Do you have any history of lung disease?"  (e.g., pulmonary embolus, asthma, emphysema)     denies 9. PE RISK FACTORS: "Do you have a history of blood clots?" (or: recent major surgery, recent prolonged travel, bedridden)     Denies  10. OTHER SYMPTOMS: "Do you have any other symptoms?" (e.g., runny nose, wheezing, chest pain)       Runny nose, cough 11. PREGNANCY: "Is there any chance you are pregnant?" "When was your last menstrual period?"       no 12. TRAVEL: "Have you traveled out of the country in the last month?" (e.g., travel history, exposures)       no  Protocols used: Cough - Acute Productive-A-AH

## 2023-02-28 ENCOUNTER — Ambulatory Visit (INDEPENDENT_AMBULATORY_CARE_PROVIDER_SITE_OTHER): Payer: 59 | Admitting: Internal Medicine

## 2023-02-28 ENCOUNTER — Encounter: Payer: Self-pay | Admitting: Internal Medicine

## 2023-02-28 VITALS — BP 132/61 | HR 72 | Ht 65.0 in | Wt 188.0 lb

## 2023-02-28 DIAGNOSIS — J111 Influenza due to unidentified influenza virus with other respiratory manifestations: Secondary | ICD-10-CM | POA: Diagnosis not present

## 2023-02-28 MED ORDER — OSELTAMIVIR PHOSPHATE 75 MG PO CAPS: 75.0000 mg | ORAL_CAPSULE | Freq: Two times a day (BID) | ORAL | 0 refills | Status: DC

## 2023-02-28 MED ORDER — PROMETHAZINE-DM 6.25-15 MG/5ML PO SYRP: 5.0000 mL | ORAL_SOLUTION | Freq: Four times a day (QID) | ORAL | 0 refills | Status: DC | PRN

## 2023-02-28 NOTE — Progress Notes (Unsigned)
Acute Office Visit  Subjective:    Patient ID: [name], female    DOB: 12/17/1945, 78 y.o.   MRN: [id number]  Chief Complaint  Patient presents with   URI    Pt reports sx of uri , cough and sore throat started on 02/26/23 , fever started on  02/27/23.     HPI Patient is in today for complaint of nasal congestion, postnasal drip, sore throat, cough with clear expectoration and fever for the last 3 days.  She also has fatigue and muscle aches.  Denies any dyspnea or wheezing currently.  Denies vomiting or diarrhea currently.  Her grandchild had his Influenza A test positive in the last week and she was in close contact.  Past Medical History:  Diagnosis Date   Aortic atherosclerosis (HCC)    Breast cancer (HCC) 2007   Stage I (T1b N0 M0), grade 1 well-differentiated carcinoma of the left breast status, post lumpectomy followed by radiation therapy. Her estrogen receptor receptors were 93%, progesterone receptors 67%. HER-2/neu was negative. No lymphovascular space invasion was seen. All margins were clear. Ki-67 marker was low at 1% with surgery on 11/15/2004. Treated then with post-lumpectomy radiation, finish   Breast cancer, left breast (HCC) 2007   COPD (chronic obstructive pulmonary disease) (HCC)    Coronary artery calcification seen on CAT scan    Diabetes mellitus without complication (HCC)    diet controlled   Diverticula of colon    Hx of rickettsial disease    Hyperlipidemia    Hypertension    Kidney stones    Nicotine dependence    Osteoarthritis     Past Surgical History:  Procedure Laterality Date   ABDOMINAL HYSTERECTOMY     BREAST SURGERY  2005 approx   left lumpectomy    CATARACT EXTRACTION W/PHACO Left 05/01/2019   Procedure: CATARACT EXTRACTION PHACO AND INTRAOCULAR LENS PLACEMENT LEFT EYE CDE=11.73;  Surgeon: Fabio Pierce, MD;  Location: [facility];  Service: Ophthalmology;  Laterality: Left;  left   CATARACT EXTRACTION W/PHACO Right 05/15/2019    Procedure: CATARACT EXTRACTION PHACO AND INTRAOCULAR LENS PLACEMENT RIGHT EYE;  Surgeon: Fabio Pierce, MD;  Location: [facility];  Service: Ophthalmology;  Laterality: Right;  CDE: 16.07   CHOLECYSTECTOMY N/A 05/26/2014   Procedure: LAPAROSCOPIC CHOLECYSTECTOMY;  Surgeon: Franky Macho Md, MD;  Location: [facility];  Service: General;  Laterality: N/A;   COLECTOMY     2005, diverticulitis   COLONOSCOPY N/A 05/20/2017    Surgeon: West Bali, MD;  five 3-6 mm polyps (4 tubular adenomas, 1 hyperplastic polyp), tortuous left colon, internal hemorrhoids.  Recommended repeat colonoscopy in 3 years.   COLONOSCOPY WITH PROPOFOL N/A 07/12/2020   Procedure: COLONOSCOPY WITH PROPOFOL;  Surgeon: Lanelle Bal, DO;  Location: [facility];  Service: Endoscopy;  Laterality: N/A;  12:30pm   DILATION AND CURETTAGE OF UTERUS     ESOPHAGOGASTRODUODENOSCOPY N/A 05/20/2017   Surgeon: West Bali, MD; normal esophagus and mild gastritis due to aspirin.  Biopsies negative for H. pylori.   left breast      cancer, in 2006   POLYPECTOMY  07/12/2020   Procedure: POLYPECTOMY;  Surgeon: Lanelle Bal, DO;  Location: [facility];  Service: Endoscopy;;   TUBAL LIGATION      Family History  Problem Relation Age of Onset   Breast cancer Mother    COPD Father    Emphysema Father    Throat cancer Sister    Glaucoma Brother  Schizophrenia Brother    Heart attack Brother    Lung cancer Sister        former smoker   Hepatitis C Daughter    Seizures Daughter    SIDS Son    Colon cancer Neg Hx    Colon polyps Neg Hx     Social History   Socioeconomic History   Marital status: Single    Spouse name: Not on file   Number of children: 5   Years of education: 9th grade    Highest education level: 9th grade  Occupational History   Occupation: retired    Associate Professor: RETIRED  Tobacco Use   Smoking status: Former    Current packs/day: 0.00    Average packs/day: 0.5 packs/day for 50.0 years (25.0 ttl  pk-yrs)    Types: Cigarettes    Start date: 04/08/1972    Quit date: 04/09/2022    Years since quitting: 0.8   Smokeless tobacco: Never   Tobacco comments:    smokes 1/2 pack per day 11/19/2019  Vaping Use   Vaping status: Never Used  Substance and Sexual Activity   Alcohol use: No    Alcohol/week: 0.0 standard drinks of alcohol   Drug use: No   Sexual activity: Not Currently  Other Topics Concern   Not on file  Social History Narrative   ** Merged History Encounter ** 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 QUIT WORKING 2003 AFTER CANCER DIAGNOSIS.      MARRIED WITH A RUN AWAY HUSBAND(FUGITIVE 10 YRS). 3 MISCARRIAGES, 5 CHILDREN   Right handed    Social Drivers of Health   Financial Resource Strain: Low Risk  (02/26/2023)   Overall Financial Resource Strain (CARDIA)    Difficulty of Paying Living Expenses: Not hard at all  Food Insecurity: No Food Insecurity (02/26/2023)   Hunger Vital Sign    Worried About Running Out of Food in the Last Year: Never true    Ran Out of Food in the Last Year: Never true  Transportation Needs: No Transportation Needs (02/26/2023)   PRAPARE - Administrator, Civil Service (Medical): No    Lack of Transportation (Non-Medical): No  Physical Activity: Patient Declined (02/26/2023)   Exercise Vital Sign    Days of Exercise per Week: Patient declined    Minutes of Exercise per Session: Patient declined  Stress: No Stress Concern Present (02/26/2023)   [employer] of Occupational Health - Occupational Stress Questionnaire    Feeling of Stress : Not at all  Social Connections: Socially Isolated (02/26/2023)   Social Connection and Isolation Panel [NHANES]    Frequency of Communication with Friends and Family: More than three times a week    Frequency of Social Gatherings with Friends and Family: More than three times a week    Attends Religious Services: Never    Database administrator or Organizations: No     Attends Banker Meetings: Never    Marital Status: Divorced  Catering manager Violence: Not At Risk (02/26/2023)   Humiliation, Afraid, Rape, and Kick questionnaire    Fear of Current or Ex-Partner: No    Emotionally Abused: No    Physically Abused: No    Sexually Abused: No    Outpatient Medications Prior to Visit  Medication Sig Dispense Refill   acetaminophen (TYLENOL) 325 MG tablet Take 650 mg by mouth every 6 (six) hours as needed for moderate pain.      albuterol (PROVENTIL) (2.5 MG/3ML)  0.083% nebulizer solution INHALE ONE VIAL VIA NEBULIZER THREE TIMES DAILY. (Patient taking differently: Take 2.5 mg by nebulization 3 (three) times daily as needed for wheezing or shortness of breath.) 300 mL 0   albuterol (VENTOLIN HFA) 108 (90 Base) MCG/ACT inhaler INHALE (2) PUFFS EVERY FOURHOURS AS NEEDED ONLY IF YOU CANT CATCH YOUR BREATH (Patient taking differently: Inhale 2 puffs into the lungs every 4 (four) hours as needed for wheezing or shortness of breath. INHALE (2) PUFFS EVERY FOURHOURS AS NEEDED ONLY IF YOU CANT CATCH YOUR BREATH) 8.5 g 0   ATROVENT HFA 17 MCG/ACT inhaler INHALE 2 PUFFS BY MOUTH FOUR TIMES A DAY. 12.9 g 0   bisoprolol (ZEBETA) 5 MG tablet Take 1 tablet (5 mg total) by mouth daily. 30 tablet 5   Calcium Carbonate-Vitamin D (CALCIUM 600 + D PO) Take 1 tablet by mouth 2 (two) times daily.     Cyanocobalamin (VITAMIN B 12) 500 MCG TABS Take 500 mcg by mouth daily. Take one tablet by mouth once daily 90 tablet 3   cyclobenzaprine (FLEXERIL) 5 MG tablet Take 1 tablet (5 mg total) by mouth at bedtime. 30 tablet 5   ELIQUIS 5 MG TABS tablet TAKE 1 TABLET BY MOUTH TWICE A DAY 60 tablet 5   ENTRESTO 24-26 MG Take 1 tablet by mouth 2 (two) times daily. 60 tablet 5   erythromycin ophthalmic ointment Place 1 Application into both eyes at bedtime. Apply daily at bedtime to both eyes for 1 week 3.5 g 0   FARXIGA 10 MG TABS tablet Take 1 tablet (10 mg total) by mouth daily. 30  tablet 5   fluticasone (FLONASE) 50 MCG/ACT nasal spray PLACE 2 SPRAYS IN EACH NOSTRIL DAILY. 16 g 5   furosemide (LASIX) 20 MG tablet Take 1 tablet (20 mg total) by mouth daily. 30 tablet 3   hydrOXYzine (ATARAX) 25 MG tablet Take 1 tablet (25 mg total) by mouth 2 (two) times daily as needed for itching.     Misc. Devices MISC Please provide patient with mastectomy bra and prosthesis. Dx: History of left breast cancer 1 each 0   Naphazoline HCl (CLEAR EYES OP) Place 1 drop into both eyes daily as needed (irritation).     pantoprazole (PROTONIX) 40 MG tablet Take 1 tablet (40 mg total) by mouth every morning. 90 tablet 0   polyethylene glycol powder (GLYCOLAX/MIRALAX) powder MIX 1 CAPFUL IN 8 OUNCES OF JUICE OR WATER AND DRINK ONCE DAILY. (Patient taking differently: Take 17 g by mouth daily.) 527 g 5   rosuvastatin (CRESTOR) 20 MG tablet Take 1 tablet (20 mg total) by mouth daily. 90 tablet 3   spironolactone (ALDACTONE) 25 MG tablet Take 1 tablet (25 mg total) by mouth daily. 30 tablet 0   SYMBICORT 160-4.5 MCG/ACT inhaler Inhale 2 puffs into the lungs 2 (two) times daily. 10.2 g 0   traMADol (ULTRAM) 50 MG tablet TAKE (1) TABLET BY MOUTH EVERY 8 HOURS AS NEEDED. 60 tablet 3   No facility-administered medications prior to visit.    Allergies  Allergen Reactions   Penicillins Hives, Shortness Of Breath and Swelling    Has patient had a PCN reaction causing immediate rash, facial/tongue/throat swelling, SOB or lightheadedness with hypotension: yes Has patient had a PCN reaction causing severe rash involving mucus membranes or skin necrosis:no Has patient had a PCN reaction that required hospitalization: yes Has patient had a PCN reaction occurring within the last 10 years: no If all of the  above answers are "NO", then may proceed with Cephalosporin use. Tolerates rocephin/keflex   Sulfonamide Derivatives Other (See Comments)    Shaking all over, seizure like symptoms. Hospitalization  resulted    Avapro [Irbesartan] Other (See Comments)    Pt associates avapro with bruising though aware scientifically not the case, wants to chanmge med   Levaquin [Levofloxacin] Itching    Review of Systems  Constitutional:  Positive for chills, fatigue and fever.  HENT:  Positive for congestion, postnasal drip, sinus pressure and sore throat.   Respiratory:  Positive for cough. Negative for shortness of breath.   Cardiovascular:  Negative for chest pain and palpitations.  Gastrointestinal:  Negative for nausea and vomiting.  Genitourinary:  Negative for dysuria and hematuria.  Musculoskeletal:  Negative for neck pain and neck stiffness.  Skin:  Negative for rash.  Neurological:  Positive for weakness and headaches.  Psychiatric/Behavioral:  Negative for agitation and behavioral problems.        Objective:    Physical Exam Constitutional:      General: She is not in acute distress.    Appearance: She is obese. She is not diaphoretic.  HENT:     Head: Normocephalic and atraumatic.     Nose: Congestion present.     Mouth/Throat:     Mouth: Mucous membranes are dry.     Pharynx: Posterior oropharyngeal erythema present.  Eyes:     General: No scleral icterus.    Extraocular Movements: Extraocular movements intact.  Cardiovascular:     Rate and Rhythm: Normal rate and regular rhythm.     Heart sounds: Normal heart sounds. No murmur heard. Pulmonary:     Breath sounds: No wheezing or rales.  Musculoskeletal:     Right lower leg: Edema (Trace) present.     Left lower leg: Edema (1+) present.  Skin:    General: Skin is warm.     Findings: No rash.  Neurological:     General: No focal deficit present.     Mental Status: She is alert and oriented to person, place, and time.  Psychiatric:        Mood and Affect: Mood normal.        Behavior: Behavior normal.     BP 132/61   Pulse 72   Ht 5\' 5"  (1.651 m)   Wt 188 lb (85.3 kg)   SpO2 (!) 87%   BMI 31.28 kg/m  Wt  Readings from Last 3 Encounters:  02/28/23 188 lb (85.3 kg)  02/26/23 192 lb (87.1 kg)  01/30/23 192 lb 6.4 oz (87.3 kg)        Assessment & Plan:   Problem List Items Addressed This Visit    Visit Diagnoses       Influenza    -  Primary Considering her current symptoms and recent exposure to influenza A, started empiric Tamiflu Promethazine-DM syrup as needed for cough Advised to maintain adequate hydration, avoid Lasix for the next 5 days    Relevant Medications   oseltamivir (TAMIFLU) 75 MG capsule   promethazine-dextromethorphan (PROMETHAZINE-DM) 6.25-15 MG/5ML syrup   Other Relevant Orders   Veritor Flu A/B Waived        Meds ordered this encounter  Medications   oseltamivir (TAMIFLU) 75 MG capsule    Sig: Take 1 capsule (75 mg total) by mouth 2 (two) times daily.    Dispense:  10 capsule    Refill:  0   promethazine-dextromethorphan (PROMETHAZINE-DM) 6.25-15 MG/5ML syrup  Sig: Take 5 mLs by mouth 4 (four) times daily as needed for cough.    Dispense:  118 mL    Refill:  0     Tinika Bucknam Concha Se, MD

## 2023-03-02 LAB — VERITOR FLU A/B WAIVED
Influenza A: NEGATIVE
Influenza B: NEGATIVE

## 2023-03-04 ENCOUNTER — Other Ambulatory Visit: Payer: Self-pay | Admitting: Family Medicine

## 2023-03-04 DIAGNOSIS — J9601 Acute respiratory failure with hypoxia: Secondary | ICD-10-CM | POA: Diagnosis not present

## 2023-03-04 DIAGNOSIS — J4489 Other specified chronic obstructive pulmonary disease: Secondary | ICD-10-CM

## 2023-03-04 DIAGNOSIS — M5410 Radiculopathy, site unspecified: Secondary | ICD-10-CM | POA: Diagnosis not present

## 2023-03-04 DIAGNOSIS — J441 Chronic obstructive pulmonary disease with (acute) exacerbation: Secondary | ICD-10-CM | POA: Diagnosis not present

## 2023-03-04 DIAGNOSIS — R918 Other nonspecific abnormal finding of lung field: Secondary | ICD-10-CM

## 2023-03-04 DIAGNOSIS — I5021 Acute systolic (congestive) heart failure: Secondary | ICD-10-CM | POA: Diagnosis not present

## 2023-03-06 ENCOUNTER — Ambulatory Visit: Payer: 59 | Admitting: Family Medicine

## 2023-03-15 ENCOUNTER — Telehealth: Payer: Self-pay | Admitting: Orthopaedic Surgery

## 2023-03-18 ENCOUNTER — Other Ambulatory Visit: Payer: Self-pay | Admitting: Family Medicine

## 2023-03-18 DIAGNOSIS — J4489 Other specified chronic obstructive pulmonary disease: Secondary | ICD-10-CM

## 2023-03-18 DIAGNOSIS — R918 Other nonspecific abnormal finding of lung field: Secondary | ICD-10-CM

## 2023-03-18 NOTE — Telephone Encounter (Signed)
 Copied from CRM [id number]. Topic: Clinical - Medication Refill >> Mar 18, 2023  9:34 AM Fuller Mandril wrote: Most Recent Primary Care Visit:  Provider: Anabel Halon  Department: [facility]  Visit Type: ACUTE  Date: 02/28/2023  Medication: Needs inhaler refilled - states called pharmacy and requested they faxed request but has not received response. She says she needs Symbicort, pharmacy sent request for Atrovent. She sais she needs the red, white gray one  SYMBICORT 160-4.5 MCG/ACT inhaler   Has the patient contacted their pharmacy? Yes (Agent: If no, request that the patient contact the pharmacy for the refill. If patient does not wish to contact the pharmacy document the reason why and proceed with request.) (Agent: If yes, when and what did the pharmacy advise?) requested and no response   Is this the correct pharmacy for this prescription? Yes If no, delete pharmacy and type the correct one.  This is the patient's preferred pharmacy:  [facility] - [place], [place] - [address] [place] [address] Phone: [phone] Fax: [phone]   Has the prescription been filled recently? N/A  Is the patient out of the medication? Yes  Has the patient been seen for an appointment in the last year OR does the patient have an upcoming appointment? Yes  Can we respond through MyChart? No  Agent: Please be advised that Rx refills may take up to 3 business days. We ask that you follow-up with your pharmacy.

## 2023-03-19 MED ORDER — SYMBICORT 160-4.5 MCG/ACT IN AERO: 2.0000 | INHALATION_SPRAY | Freq: Two times a day (BID) | RESPIRATORY_TRACT | 0 refills | Status: DC

## 2023-03-22 DIAGNOSIS — H26493 Other secondary cataract, bilateral: Secondary | ICD-10-CM | POA: Diagnosis not present

## 2023-03-22 DIAGNOSIS — H53133 Sudden visual loss, bilateral: Secondary | ICD-10-CM | POA: Diagnosis not present

## 2023-03-22 DIAGNOSIS — H47293 Other optic atrophy, bilateral: Secondary | ICD-10-CM | POA: Diagnosis not present

## 2023-03-22 DIAGNOSIS — Z961 Presence of intraocular lens: Secondary | ICD-10-CM | POA: Diagnosis not present

## 2023-03-22 DIAGNOSIS — H4710 Unspecified papilledema: Secondary | ICD-10-CM | POA: Diagnosis not present

## 2023-03-22 DIAGNOSIS — H04123 Dry eye syndrome of bilateral lacrimal glands: Secondary | ICD-10-CM | POA: Diagnosis not present

## 2023-03-26 ENCOUNTER — Telehealth: Payer: Self-pay | Admitting: Neurology

## 2023-03-26 ENCOUNTER — Other Ambulatory Visit: Payer: Self-pay | Admitting: Family Medicine

## 2023-03-26 DIAGNOSIS — I5020 Unspecified systolic (congestive) heart failure: Secondary | ICD-10-CM

## 2023-03-26 MED ORDER — FUROSEMIDE 20 MG PO TABS: 20.0000 mg | ORAL_TABLET | Freq: Every day | ORAL | 3 refills | Status: DC

## 2023-03-26 MED ORDER — HYDROXYZINE HCL 25 MG PO TABS: 25.0000 mg | ORAL_TABLET | Freq: Two times a day (BID) | ORAL | Status: DC | PRN

## 2023-03-26 NOTE — Telephone Encounter (Signed)
 Copied from CRM [id number]. Topic: Clinical - Medication Refill >> Mar 26, 2023 10:55 AM Fuller Mandril wrote: Most Recent Primary Care Visit:  Provider: Anabel Halon  Department: [facility]  Visit Type: ACUTE  Date: 02/28/2023  Medication:  hydrOXYzine (ATARAX) 25 MG tablet furosemide (LASIX) 20 MG tablet   Has the patient contacted their pharmacy? Yes (Agent: If no, request that the patient contact the pharmacy for the refill. If patient does not wish to contact the pharmacy document the reason why and proceed with request.) (Agent: If yes, when and what did the pharmacy advise?) said they faxed request  Is this the correct pharmacy for this prescription? Yes If no, delete pharmacy and type the correct one.  This is the patient's preferred pharmacy:  [facility] - [place], [place] - [address] [place] [address] Phone: [phone] Fax: [phone]   Has the prescription been filled recently? N/A  Is the patient out of the medication? Yes  Has the patient been seen for an appointment in the last year OR does the patient have an upcoming appointment? Yes  Can we respond through MyChart? Yes  Agent: Please be advised that Rx refills may take up to 3 business days. We ask that you follow-up with your pharmacy.

## 2023-03-26 NOTE — Telephone Encounter (Signed)
 new patient or a follow up for this patient

## 2023-03-26 NOTE — Telephone Encounter (Signed)
 Pt. Is needing to be seen per Dr. Dione Booze MRI complete questionable IIH, pleae call office if questions and sched Pt. For time

## 2023-03-27 NOTE — Progress Notes (Deleted)
 Assessment/Plan:   1.  Tremor             -Patient had some baseline tremor, likely due to her lung medications, for a long time, but tremor is made much worse by the addition of amiodarone.  Her amiodarone was reduced because of tremor in July 2024, but it still persists.  Cardiology made notes back in July that they may have to stop it if tremor persisted.  I think that is really the first step.  I tried to reach out to the PA seeing her but she is out for the holidays. Told patient to make a follow up appt to discuss.  She and I did discuss that if it is able to be d/c, effects can last up to 6 months. -If they aren't able to stop the amiodarone then we can certainly trial medication.  However, her options are very limited.  She is on eliquis so primidone is not an option.  She is already on a low dose beta blocker.  That takes out all of the first line medications.  The next viable option would be gabapentin   2.  Vision loss             -Dr. Dione [name] is concerned about IIH due to partially empty sella.  Partially empty sella is certainly a nonspecific finding, and often times is nonspecific/normal.  IIH would be somewhat unusual in this age group but there is no doubt that the patient has apparently lost significant amounts of vision.  We talked about lumbar puncture along with the risks and benefits.  Patient is on Eliquis and there is a risk of stroke if we stop this.  In addition, she is already on hospice, so we discussed   Subjective:   [name] was seen in work in today.  She initially saw me, as a movement d/o specialist, for tremor.  However, she scheduled a video visit the last day of December to discuss vision issues.  I had no notes about her vision change at that time from her ophthalmologist but was concerned about it, especially since her granddaughter mentioned it started around the time that amiodarone started.  I wondered if she had amiodarone induced optic neuropathy.  I did  mention the vision changes to her cardiologist, who ended up stopping the amiodarone.  This was stopped mid January.  I got a call from Dr. Laruth [name] office 2 days ago asking me to work the patient in for vision changes, but unfortunately no one talked to me about the patient.  I was able to see that she had an MRI of the brain and the patient had a partially empty sella and the radiologist put IIH in the differential and ophthalmology follow-up that might be the reason for her vision change.  I was able to obtain notes from the ophthalmology PA from her January 17 visit.  This noted multiple diagnoses, including "other optic atrophy" bilaterally.  It also noted disc edema bilaterally, but it is unclear how significant that is.   There are other things also noted in the impression/plan that I think are just differential diagnoses, including giant cell arteritis, nonarteritic ischemic optic neuropathy.  It states that the patient is supposed to follow-up with the PA in 4 to 5 weeks.  It is unclear if the patient is actually seeing the ophthalmologist.  The notes do say that the patient has been seen by [facility] and [place] [facility].  Current/Previously tried  tremor medications: bisoprolol  Current medications that may exacerbate tremor:  albuterol, atrovent; symbicort; amiodarone    Allergies  Allergen Reactions   Penicillins Hives, Shortness Of Breath and Swelling    Has patient had a PCN reaction causing immediate rash, facial/tongue/throat swelling, SOB or lightheadedness with hypotension: yes Has patient had a PCN reaction causing severe rash involving mucus membranes or skin necrosis:no Has patient had a PCN reaction that required hospitalization: yes Has patient had a PCN reaction occurring within the last 10 years: no If all of the above answers are "NO", then may proceed with Cephalosporin use. Tolerates rocephin/keflex   Sulfonamide Derivatives Other (See Comments)    Shaking all over,  seizure like symptoms. Hospitalization resulted    Avapro [Irbesartan] Other (See Comments)    Pt associates avapro with bruising though aware scientifically not the case, wants to chanmge med   Levaquin [Levofloxacin] Itching    No outpatient medications have been marked as taking for the 03/28/23 encounter (Appointment) with [name], Tanya Batty, DO.      Objective:   VITALS:   There were no vitals filed for this visit.  Gen:  Appears stated age and in NAD. HEENT:  Normocephalic, atraumatic. The mucous membranes are moist. The superficial temporal arteries are without ropiness or tenderness. Cardiovascular: Regular rate and rhythm. Lungs: Clear to auscultation bilaterally.  She is wearing oxygen. Neck: There are no carotid bruits noted bilaterally.  NEUROLOGICAL:  Orientation:  The patient is alert and oriented to person and place.  However her history is not always accurate and she relies on her granddaughter for assistance Cranial nerves: There is good facial symmetry. Extraocular muscles are intact and visual fields are full to confrontational testing. Speech is fluent and clear. Soft palate rises symmetrically and there is no tongue deviation. Hearing is intact to conversational tone. Tone: Tone is good throughout. Sensation: Sensation is intact to light touch touch throughout (facial, trunk, extremities).  Vibration is intact at the bilateral ankle. Coordination:  The patient has no decremation with rapid alternating movements. Motor: Strength is 5/5 in the bilateral upper and lower extremities.  Shoulder shrug is equal bilaterally.  There is no pronator drift.  There are no fasciculations noted. Gait and Station: The patient pushes off to arise.  She is given her walker.  She walks slow and purposeful.  When she is out in the hall, we did take her walker away and she was not shuffling, but again she walks slow and purposeful.  MOVEMENT EXAM: Tremor: there is jaw tremor.  There is  some tongue tremor in the mouth.  There is bilateral UE rest tremor, R>L  There is postural tremor.  She has trouble with Archimedes spirals bilaterally.  She is given unknown object to hold to her mouth as if she was drinking it, and she has trouble with this, overall mild to moderate.  I have reviewed and interpreted the following labs independently   Chemistry      Component Value Date/Time   NA 144 02/19/2023 1625   K 5.2 02/19/2023 1625   CL 101 02/19/2023 1625   CO2 24 02/19/2023 1625   BUN 15 02/19/2023 1625   CREATININE 1.43 (H) 02/19/2023 1625   CREATININE 0.89 09/25/2019 0958      Component Value Date/Time   CALCIUM 9.8 02/19/2023 1625   ALKPHOS 82 12/05/2022 1002   AST 18 12/05/2022 1002   ALT 15 12/05/2022 1002   BILITOT 0.3 12/05/2022 1002  Lab Results  Component Value Date   WBC 8.4 02/19/2023   HGB 13.6 02/19/2023   HCT 41.9 02/19/2023   MCV 89 02/19/2023   PLT 547 (H) 02/19/2023   Lab Results  Component Value Date   TSH 2.650 12/05/2022      Total time spent on today's visit was *** minutes, including both face-to-face time and nonface-to-face time.  Time included that spent on review of records (prior notes available to me/labs/imaging if pertinent), discussing treatment and goals, answering patient's questions and coordinating care.  CC:  Kerri Perches, MD

## 2023-03-27 NOTE — Telephone Encounter (Signed)
 Left message at [facility] eye care to talk to the retina specialist about her, as the notes from [facility] eye care from the PA were a bit confusing, and did not see any from the ophthalmologist himself.  I was preparing for the patient's visit tomorrow, and then realize she canceled the visit.  The receptionist stated that Dr. Allyne Gee would call me back later this afternoon.  They were willing to send records.

## 2023-03-27 NOTE — Telephone Encounter (Signed)
 Pt called and rescheduled her appt due to not having a ride for 03/28/23.

## 2023-03-27 NOTE — Telephone Encounter (Signed)
 Copied from CRM [id number]. Topic: Clinical - Prescription Issue >> Mar 27, 2023 11:28 AM Ivette P wrote: Reason for CRM: Pt granddaughter Marchelle Folks called into follow up on medication hydrOXYzine (ATARAX) 25 MG tablet, prescription was filled yesterday 03/11 but pharmacy is stating that they have not received. On prescription order does not show pharmacy it was sent to. Pt would like a callback of an update as to where is the medication   Pt callback Number [phone]  Pharmacy: [facility] [address] [place],  [place], [place] [address] Phone: [phone]

## 2023-03-28 ENCOUNTER — Other Ambulatory Visit: Payer: Self-pay | Admitting: Family Medicine

## 2023-03-28 ENCOUNTER — Ambulatory Visit: Admitting: Neurology

## 2023-03-28 ENCOUNTER — Other Ambulatory Visit: Payer: Self-pay

## 2023-03-28 ENCOUNTER — Telehealth: Payer: Self-pay

## 2023-03-28 MED ORDER — HYDROXYZINE HCL 25 MG PO TABS: 25.0000 mg | ORAL_TABLET | Freq: Two times a day (BID) | ORAL | 1 refills | Status: DC | PRN

## 2023-03-28 NOTE — Telephone Encounter (Unsigned)
 Copied from CRM [id number]. Topic: Clinical - Medication Refill >> Mar 28, 2023 10:54 AM Martha Clan wrote: Most Recent Primary Care Visit:  Provider: Anabel Halon  Department: [facility]  Visit Type: ACUTE  Date: 02/28/2023  Medication: hydrOXYzine (ATARAX) 25 MG tablet [[id number]]  Has the patient contacted their pharmacy? Yes (Agent: If no, request that the patient contact the pharmacy for the refill. If patient does not wish to contact the pharmacy document the reason why and proceed with request.) (Agent: If yes, when and what did the pharmacy advise?)  Is this the correct pharmacy for this prescription? Yes If no, delete pharmacy and type the correct one.  This is the patient's preferred pharmacy:  [facility] - [place], [place] - [address] [place] [address] Phone: [phone] Fax: [phone]   Has the prescription been filled recently? No  Is the patient out of the medication? Yes  Has the patient been seen for an appointment in the last year OR does the patient have an upcoming appointment? Yes  Can we respond through MyChart? No  Agent: Please be advised that Rx refills may take up to 3 business days. We ask that you follow-up with your pharmacy.

## 2023-03-28 NOTE — Telephone Encounter (Addendum)
 I spoke with Dr. Allyne Gee at [facility] yesterday and it turns out that the patient had not seen him since July, 2024.  He noted that the patient had left anterior ischemic optic neuropathy at the time.  He was not sure if that was the issue now.  I was able to call [facility] and speak with the PA, Alma Downs, that asked the patient be urgently worked in.  Discussed with him that I did not have the notes to quite understand, but we were able to obtain those this morning.  He noted that patient has had an 71-month history of vision loss and he noted that the vision will not come back.  He stated he was looking for causes of vision change, which he has not yet found, but has considered NAION.  He did the MRI brain and he noted that there was a "partially empty sella" and felt that perhaps her vision loss was from IIH.  Discussed with him that this is often an incidental finding and IIH would be unusual in this age group, especially with an 85-month history of vision loss.  Discussed with him the risks and benefits of doing a spinal tap in this patient.  She is on Eliquis.  She is also apparently on hospice (under media, there are records from [facility]).  I worry about increased risk of stroke taking her off of Eliquis for the lumbar puncture.  He did state that she will not regain her vision even with treatment, if IIH was the cause and she has no HA to suggest IIH (although that isn't always needed).  I asked him if he could consider perhaps just placing her on Diamox, but also told him that I could talk with patient/granddaughter about these issues.  Unfortunately, they canceled her appointment that we worked her in, which was scheduled for today.  They did reschedule for a couple weeks from now.

## 2023-03-28 NOTE — Telephone Encounter (Signed)
 Copied from CRM [id number]. Topic: Clinical - Prescription Issue >> Mar 28, 2023 11:03 AM Elle L wrote: Reason for CRM: Pharmacist Meredith with [facility] requesting a verbal order for hydrOXYzine (ATARAX) 25 MG tablet prescription as they did not receive the fax. Their call back number is [phone].

## 2023-03-28 NOTE — Telephone Encounter (Signed)
 Med resent

## 2023-04-01 ENCOUNTER — Ambulatory Visit: Payer: Self-pay | Admitting: Family Medicine

## 2023-04-01 DIAGNOSIS — I5021 Acute systolic (congestive) heart failure: Secondary | ICD-10-CM | POA: Diagnosis not present

## 2023-04-01 DIAGNOSIS — J9601 Acute respiratory failure with hypoxia: Secondary | ICD-10-CM | POA: Diagnosis not present

## 2023-04-01 DIAGNOSIS — J441 Chronic obstructive pulmonary disease with (acute) exacerbation: Secondary | ICD-10-CM | POA: Diagnosis not present

## 2023-04-01 DIAGNOSIS — M5410 Radiculopathy, site unspecified: Secondary | ICD-10-CM | POA: Diagnosis not present

## 2023-04-01 NOTE — Telephone Encounter (Signed)
 Copied from CRM [id number]. Topic: Clinical - Red Word Triage >> Apr 01, 2023  8:46 AM Gaetano Hawthorne wrote: Red Word that prompted transfer to Nurse Triage: Patient placed in Lasix for ankle swelling, patient's granddaughter has been giving her the medication as instructed but she is seeing her ankles return to level of swelling that it was. Patient has an appointment on Wednesday with Dr. Lodema Hong - she would like to know what to do before then.  The patient's granddaughter reported that the patient has increased swelling in her bilateral feet and ankles.  She said "they look puffy and you can't see her wrinkles."  This has been ongoing for 3-4 days.  When the patient was seen around 1 month ago, she was prescribed furosemide 20 mg daily.   She said the swelling started after the patient's amiodarone was discontinued. This is the first time since lasix was prescribed that the patient has had swelling.  She takes it daily and has not missed a dose.  She stated that her grandmother has dementia and she has been up on her feet a lot and will not sit down or elevate her feet; she stated that she is worse at night.  She has not had shortness of breath, her feet or ankles are not red, and she is not having pain.    The patient is already scheduled for an appointment Wednesday.  Routed to pcp to advise about lasix dosage or recommendations until the appointment.  Reason for Disposition  Ankle swelling is a chronic symptom (recurrent or ongoing AND present > 4 weeks)  Answer Assessment - Initial Assessment Questions 1. LOCATION: "Which ankle is swollen?" "Where is the swelling?"     Bilateral feet and ankles  2. ONSET: "When did the swelling start?"     3-4 days ago  3. SWELLING: "How bad is the swelling?" Or, "How large is it?" (e.g., mild, moderate, severe; size of localized swelling)    - NONE: No joint swelling.   - LOCALIZED: Localized; small area of puffy or swollen skin (e.g., insect bite, skin irritation).   -  MILD: Joint looks or feels mildly swollen or puffy.   - MODERATE: Swollen; interferes with normal activities (e.g., work or school); decreased range of movement; may be limping.   - SEVERE: Very swollen; can't move swollen joint at all; limping a lot or unable to walk.     Puffy can't see wrinkles  4. PAIN: "Is there any pain?" If Yes, ask: "How bad is it?" (Scale 1-10; or mild, moderate, severe)   - NONE (0): no pain.   - MILD (1-3): doesn't interfere with normal activities.    - MODERATE (4-7): interferes with normal activities (e.g., work or school) or awakens from sleep, limping.    - SEVERE (8-10): excruciating pain, unable to do any normal activities, unable to walk.      No pain  5. CAUSE: "What do you think caused the ankle swelling?"     unknown 6. OTHER SYMPTOMS: "Do you have any other symptoms?" (e.g., fever, chest pain, difficulty breathing, calf pain)     Denied other symptoms  Protocols used: Ankle Swelling-A-AH

## 2023-04-03 ENCOUNTER — Ambulatory Visit: Payer: 59 | Admitting: Family Medicine

## 2023-04-03 VITALS — BP 137/72 | HR 66 | Resp 16 | Ht 65.0 in | Wt 187.0 lb

## 2023-04-03 DIAGNOSIS — M79675 Pain in left toe(s): Secondary | ICD-10-CM

## 2023-04-03 DIAGNOSIS — S99922A Unspecified injury of left foot, initial encounter: Secondary | ICD-10-CM

## 2023-04-03 DIAGNOSIS — M79676 Pain in unspecified toe(s): Secondary | ICD-10-CM | POA: Insufficient documentation

## 2023-04-03 DIAGNOSIS — M19079 Primary osteoarthritis, unspecified ankle and foot: Secondary | ICD-10-CM | POA: Diagnosis not present

## 2023-04-03 NOTE — [place] Notes (Signed)
   [name]     MRN: [id number]      DOB: 10/16/1945  Chief Complaint  Patient presents with   Leg Swelling    Both ankles and feet are swollen x 1 week. Did hit the top of her left foot and its bruised. Has been elevating her legs as able     HPI [name] is here  with above complaints. Bruising pain and swelling of 2nd, 3rd and 4th left toes AFTERDIRECT TRAUMA , STILL ABLE TO WEIGHT BEAR BUT PAINFUL, ALSO REDUCED rom BOTH ANKLES LEFT WORSE THAN RIGHT ROS See HPI  Denies recent fever or chills. Denies sinus pressure, nasal congestion, ear pain or sore throat. Denies chest congestion, productive cough or wheezing. Denies chest pains, palpitations and leg swelling Denies abdominal pain, nausea, vomiting,diarrhea or constipation.   . Denies skin break down or rash.c/o bruise where she had trauma   PE  BP 137/72   Pulse 66   Resp 16   Ht 5\' 5"  (1.651 m)   Wt 187 lb (84.8 kg)   SpO2 92%   BMI 31.12 kg/m   Patient alert and oriented and in no cardiopulmonary distress.  HEENT: No facial asymmetry, EOMI,     Neck supple .  Chest: Clear to auscultation bilaterally.decreased aire entry  CVS: S1, S2 no murmurs, no S3.Regular rate.    Ext: bipedal edema left worse than right, bruising at base of toe MS: decreased  ROM spine, shoulders, hips and knees.  Skin: Intact, no ulcerations or rash noted.  Psych: Good eye contact, normal affect. Memory intact not anxious or depressed appearing.  CNS: CN 2-12 intact, power,  normal throughout.no focal deficits noted.   Assessment & Plan  Osteoarthrosis, ankle and foot 1 week h/o  increased pain and swelling left worse than right also recent indirect trauma with reduced mobility  Toe pain Acute s/P traumaon 3/17, ortho eval possible fracture  Injury of left toe Possible fracture will have ortho eval asap,  able to weight bear, defer imaging to Ortho

## 2023-04-03 NOTE — Patient Instructions (Signed)
 Follow-up in 4 months, call if you need me sooner.  You are referred urgently to orthopedics regarding the pain and swelling of the ankles and toes on the left foot in particular.  Uric acid level today to check for gout.  Thanks for choosing [facility], we consider it a privelige to serve you.

## 2023-04-03 NOTE — Assessment & Plan Note (Addendum)
 1 week h/o  increased pain and swelling left worse than right also recent indirect trauma with reduced mobility

## 2023-04-03 NOTE — Assessment & Plan Note (Signed)
 Acute s/P traumaon 3/17, ortho eval possible fracture

## 2023-04-04 ENCOUNTER — Encounter: Payer: Self-pay | Admitting: Family Medicine

## 2023-04-04 LAB — URIC ACID: Uric Acid: 5.3 mg/dL (ref 3.1–7.9)

## 2023-04-07 ENCOUNTER — Encounter (HOSPITAL_COMMUNITY): Payer: Self-pay

## 2023-04-07 ENCOUNTER — Emergency Department (HOSPITAL_COMMUNITY)

## 2023-04-07 ENCOUNTER — Other Ambulatory Visit: Payer: Self-pay

## 2023-04-07 ENCOUNTER — Emergency Department (HOSPITAL_COMMUNITY)
Admission: EM | Admit: 2023-04-07 | Discharge: 2023-04-07 | Disposition: A | Discharge status: Home / Self Care | Attending: Emergency Medicine | Admitting: Emergency Medicine

## 2023-04-07 DIAGNOSIS — W01198A Fall on same level from slipping, tripping and stumbling with subsequent striking against other object, initial encounter: Secondary | ICD-10-CM | POA: Diagnosis not present

## 2023-04-07 DIAGNOSIS — R58 Hemorrhage, not elsewhere classified: Secondary | ICD-10-CM | POA: Diagnosis not present

## 2023-04-07 DIAGNOSIS — Z853 Personal history of malignant neoplasm of breast: Secondary | ICD-10-CM | POA: Insufficient documentation

## 2023-04-07 DIAGNOSIS — M47812 Spondylosis without myelopathy or radiculopathy, cervical region: Secondary | ICD-10-CM | POA: Diagnosis not present

## 2023-04-07 DIAGNOSIS — J449 Chronic obstructive pulmonary disease, unspecified: Secondary | ICD-10-CM | POA: Diagnosis not present

## 2023-04-07 DIAGNOSIS — S0990XA Unspecified injury of head, initial encounter: Secondary | ICD-10-CM | POA: Insufficient documentation

## 2023-04-07 DIAGNOSIS — I10 Essential (primary) hypertension: Secondary | ICD-10-CM | POA: Insufficient documentation

## 2023-04-07 DIAGNOSIS — I2510 Atherosclerotic heart disease of native coronary artery without angina pectoris: Secondary | ICD-10-CM | POA: Diagnosis not present

## 2023-04-07 DIAGNOSIS — S61512A Laceration without foreign body of left wrist, initial encounter: Secondary | ICD-10-CM | POA: Diagnosis not present

## 2023-04-07 DIAGNOSIS — S41112A Laceration without foreign body of left upper arm, initial encounter: Secondary | ICD-10-CM | POA: Insufficient documentation

## 2023-04-07 DIAGNOSIS — M25559 Pain in unspecified hip: Secondary | ICD-10-CM | POA: Diagnosis not present

## 2023-04-07 DIAGNOSIS — M79602 Pain in left arm: Secondary | ICD-10-CM | POA: Diagnosis not present

## 2023-04-07 DIAGNOSIS — I6782 Cerebral ischemia: Secondary | ICD-10-CM | POA: Insufficient documentation

## 2023-04-07 DIAGNOSIS — W010XXA Fall on same level from slipping, tripping and stumbling without subsequent striking against object, initial encounter: Secondary | ICD-10-CM

## 2023-04-07 DIAGNOSIS — Z743 Need for continuous supervision: Secondary | ICD-10-CM | POA: Diagnosis not present

## 2023-04-07 DIAGNOSIS — M25552 Pain in left hip: Secondary | ICD-10-CM | POA: Diagnosis not present

## 2023-04-07 DIAGNOSIS — E119 Type 2 diabetes mellitus without complications: Secondary | ICD-10-CM | POA: Insufficient documentation

## 2023-04-07 DIAGNOSIS — M5030 Other cervical disc degeneration, unspecified cervical region: Secondary | ICD-10-CM | POA: Insufficient documentation

## 2023-04-07 DIAGNOSIS — Z79899 Other long term (current) drug therapy: Secondary | ICD-10-CM | POA: Diagnosis not present

## 2023-04-07 DIAGNOSIS — M19012 Primary osteoarthritis, left shoulder: Secondary | ICD-10-CM | POA: Diagnosis not present

## 2023-04-07 DIAGNOSIS — Z7901 Long term (current) use of anticoagulants: Secondary | ICD-10-CM | POA: Insufficient documentation

## 2023-04-07 DIAGNOSIS — M1812 Unilateral primary osteoarthritis of first carpometacarpal joint, left hand: Secondary | ICD-10-CM | POA: Diagnosis not present

## 2023-04-07 DIAGNOSIS — G9389 Other specified disorders of brain: Secondary | ICD-10-CM | POA: Diagnosis not present

## 2023-04-07 DIAGNOSIS — S199XXA Unspecified injury of neck, initial encounter: Secondary | ICD-10-CM | POA: Diagnosis not present

## 2023-04-07 DIAGNOSIS — T07XXXA Unspecified multiple injuries, initial encounter: Secondary | ICD-10-CM

## 2023-04-07 DIAGNOSIS — W19XXXA Unspecified fall, initial encounter: Secondary | ICD-10-CM | POA: Diagnosis not present

## 2023-04-07 DIAGNOSIS — M25532 Pain in left wrist: Secondary | ICD-10-CM | POA: Diagnosis not present

## 2023-04-07 DIAGNOSIS — M4312 Spondylolisthesis, cervical region: Secondary | ICD-10-CM | POA: Diagnosis not present

## 2023-04-07 DIAGNOSIS — M4802 Spinal stenosis, cervical region: Secondary | ICD-10-CM | POA: Diagnosis not present

## 2023-04-07 LAB — CBC WITH DIFFERENTIAL/PLATELET
Abs Immature Granulocytes: 0.03 10*3/uL (ref 0.00–0.07)
Basophils Absolute: 0.1 10*3/uL (ref 0.0–0.1)
Basophils Relative: 1 %
Eosinophils Absolute: 0.3 10*3/uL (ref 0.0–0.5)
Eosinophils Relative: 3 %
HCT: 39.4 % (ref 36.0–46.0)
Hemoglobin: 12.2 g/dL (ref 12.0–15.0)
Immature Granulocytes: 0 %
Lymphocytes Relative: 22 %
Lymphs Abs: 2.1 10*3/uL (ref 0.7–4.0)
MCH: 28.6 pg (ref 26.0–34.0)
MCHC: 31 g/dL (ref 30.0–36.0)
MCV: 92.5 fL (ref 80.0–100.0)
Monocytes Absolute: 1.4 10*3/uL — ABNORMAL HIGH (ref 0.1–1.0)
Monocytes Relative: 14 %
Neutro Abs: 5.9 10*3/uL (ref 1.7–7.7)
Neutrophils Relative %: 60 %
Platelets: 291 10*3/uL (ref 150–400)
RBC: 4.26 MIL/uL (ref 3.87–5.11)
RDW: 15.5 % (ref 11.5–15.5)
WBC: 9.7 10*3/uL (ref 4.0–10.5)
nRBC: 0 % (ref 0.0–0.2)

## 2023-04-07 LAB — BASIC METABOLIC PANEL
Anion gap: 7 (ref 5–15)
BUN: 15 mg/dL (ref 8–23)
CO2: 26 mmol/L (ref 22–32)
Calcium: 9.3 mg/dL (ref 8.9–10.3)
Chloride: 105 mmol/L (ref 98–111)
Creatinine, Ser: 0.95 mg/dL (ref 0.44–1.00)
GFR, Estimated: >60 mL/min (ref >60)
Glucose, Bld: 102 mg/dL — ABNORMAL HIGH (ref 70–99)
Potassium: 3.7 mmol/L (ref 3.5–5.1)
Sodium: 138 mmol/L (ref 135–145)

## 2023-04-07 MED ORDER — ACETAMINOPHEN 500 MG PO TABS
1000.0000 mg | ORAL_TABLET | Freq: Once | ORAL | Status: AC
  Administered 2023-04-07: 1000 mg via ORAL
  Filled 2023-04-07: qty 2

## 2023-04-07 MED ORDER — BACITRACIN ZINC 500 UNIT/GM EX OINT
TOPICAL_OINTMENT | CUTANEOUS | Status: AC
  Filled 2023-04-07: qty 1.8

## 2023-04-07 NOTE — ED Triage Notes (Signed)
 Pt bib [facility] from home. Per EMS pt was trying to go to the restroom when she tripped on her oxygen tubing falling hitting the left side of her face and left hip. Denis LOC, does take Eliquis

## 2023-04-07 NOTE — ED Provider Notes (Signed)
 [place] EMERGENCY DEPARTMENT AT [facility] Provider Note   CSN: [id number] Arrival date & time: 04/07/23  0059     History  Chief Complaint  Patient presents with   Marletta Lor    [name] is a 78 y.o. female.  HPI    This is a 78 year old female who presents after a fall.  Patient reports that she tripped and fell over her oxygen tubing onto her left side.  She sustained some skin tears to the left arm and reports left hip pain.  She also hit her left face.  She did not lose consciousness.  She is on blood thinners.  Also reporting some neck pain.  Home Medications Prior to Admission medications   Medication Sig Start Date End Date Taking? Authorizing Provider  acetaminophen (TYLENOL) 325 MG tablet Take 650 mg by mouth every 6 (six) hours as needed for moderate pain.     [provider]  albuterol (PROVENTIL) (2.5 MG/3ML) 0.083% nebulizer solution INHALE ONE VIAL VIA NEBULIZER THREE TIMES DAILY. Patient taking differently: Take 2.5 mg by nebulization 3 (three) times daily as needed for wheezing or shortness of breath. 07/08/18   Freddy Finner, NP  albuterol (VENTOLIN HFA) 108 (90 Base) MCG/ACT inhaler INHALE (2) PUFFS EVERY FOURHOURS AS NEEDED ONLY IF YOU CANT CATCH YOUR BREATH Patient taking differently: Inhale 2 puffs into the lungs every 4 (four) hours as needed for wheezing or shortness of breath. INHALE (2) PUFFS EVERY FOURHOURS AS NEEDED ONLY IF YOU CANT CATCH YOUR BREATH 05/12/21   Anabel Halon, MD  ATROVENT HFA 17 MCG/ACT inhaler INHALE 2 PUFFS BY MOUTH FOUR TIMES A DAY. 03/04/23   Kerri Perches, MD  bisoprolol (ZEBETA) 5 MG tablet Take 1 tablet (5 mg total) by mouth daily. 01/15/23   Kerri Perches, MD  Calcium Carbonate-Vitamin D (CALCIUM 600 + D PO) Take 1 tablet by mouth 2 (two) times daily.    [provider]  Cyanocobalamin (VITAMIN B 12) 500 MCG TABS Take 500 mcg by mouth daily. Take one tablet by mouth once daily 08/18/20    Kerri Perches, MD  cyclobenzaprine (FLEXERIL) 5 MG tablet Take 1 tablet (5 mg total) by mouth at bedtime. 05/24/22   Kerri Perches, MD  ELIQUIS 5 MG TABS tablet TAKE 1 TABLET BY MOUTH TWICE A DAY 01/15/23   Kerri Perches, MD  ENTRESTO 24-26 MG Take 1 tablet by mouth 2 (two) times daily. 01/15/23   Kerri Perches, MD  erythromycin ophthalmic ointment Place 1 Application into both eyes at bedtime. Apply daily at bedtime to both eyes for 1 week 01/11/23   Kerri Perches, MD  FARXIGA 10 MG TABS tablet Take 1 tablet (10 mg total) by mouth daily. 01/15/23   Kerri Perches, MD  fluticasone (FLONASE) 50 MCG/ACT nasal spray PLACE 2 SPRAYS IN EACH NOSTRIL DAILY. 01/15/23   Kerri Perches, MD  furosemide (LASIX) 20 MG tablet Take 1 tablet (20 mg total) by mouth daily. 03/26/23   Kerri Perches, MD  hydrOXYzine (ATARAX) 25 MG tablet Take 1 tablet (25 mg total) by mouth 2 (two) times daily as needed for itching. 03/28/23   Kerri Perches, MD  Misc. Devices MISC Please provide patient with mastectomy bra and prosthesis. Dx: History of left breast cancer 12/04/22   Kerri Perches, MD  Naphazoline HCl (CLEAR EYES OP) Place 1 drop into both eyes daily as needed (irritation).    [provider]  pantoprazole (PROTONIX) 40 MG tablet Take 1 tablet (40 mg total) by mouth every morning. 02/15/23   Nyoka Cowden, MD  polyethylene glycol powder (GLYCOLAX/MIRALAX) powder MIX 1 CAPFUL IN 8 OUNCES OF JUICE OR WATER AND DRINK ONCE DAILY. Patient taking differently: Take 17 g by mouth daily. 04/23/16   Kerri Perches, MD  rosuvastatin (CRESTOR) 20 MG tablet Take 1 tablet (20 mg total) by mouth daily. 01/30/23   Antoine Poche, MD  spironolactone (ALDACTONE) 25 MG tablet Take 1 tablet (25 mg total) by mouth daily. 12/20/22   Kerri Perches, MD  SYMBICORT 160-4.5 MCG/ACT inhaler Inhale 2 puffs into the lungs 2 (two) times daily. 03/19/23   Kerri Perches, MD   traMADol (ULTRAM) 50 MG tablet TAKE (1) TABLET BY MOUTH EVERY 8 HOURS AS NEEDED. 03/16/23   Darreld Mclean, MD      Allergies    Penicillins, Sulfonamide derivatives, Avapro [irbesartan], and Levaquin [levofloxacin]    Review of Systems   Review of Systems  Constitutional:  Negative for fever.  Respiratory:  Negative for shortness of breath.   Cardiovascular:  Negative for chest pain.  Musculoskeletal:        Left hip pain  All other systems reviewed and are negative.   Physical Exam Updated Vital Signs BP (!) 146/49   Pulse 60   Temp 98.2 F (36.8 C)   Resp 17   SpO2 98%  Physical Exam Vitals and nursing note reviewed.  Constitutional:      Appearance: She is well-developed. She is obese. She is not ill-appearing.  HENT:     Head: Normocephalic and atraumatic.  Eyes:     Pupils: Pupils are equal, round, and reactive to light.  Cardiovascular:     Rate and Rhythm: Normal rate and regular rhythm.     Heart sounds: Normal heart sounds.  Pulmonary:     Effort: Pulmonary effort is normal. No respiratory distress.     Breath sounds: No wheezing.  Abdominal:     General: Bowel sounds are normal.     Palpations: Abdomen is soft.  Musculoskeletal:     Cervical back: Normal range of motion and neck supple. Tenderness present.     Comments: Pain with range of motion of the left hip, no obvious deformity, normal range of motion of the left wrist and elbow  Skin:    General: Skin is warm and dry.     Comments: Skin tears noted to the left proximal arm and left wrist  Neurological:     Mental Status: She is alert and oriented to person, place, and time.  Psychiatric:        Mood and Affect: Mood normal.     ED Results / Procedures / Treatments   Labs (all labs ordered are listed, but only abnormal results are displayed) Labs Reviewed - No data to display  EKG None  Radiology DG Hip Unilat W or Wo Pelvis 1 View Left Result Date: 04/07/2023 CLINICAL DATA:  Recent fall  with left hip pain, initial encounter EXAM: DG HIP (WITH OR WITHOUT PELVIS) 3V*L* COMPARISON:  None Available. FINDINGS: Pelvic ring is intact. No acute fracture or dislocation is noted. No soft tissue abnormality is noted. IMPRESSION: No acute abnormality seen. Electronically Signed   By: Alcide Clever M.D.   On: 04/07/2023 02:23   DG Wrist Complete Left Result Date: 04/07/2023 CLINICAL DATA:  Recent fall with left wrist pain, initial encounter EXAM: LEFT WRIST -  COMPLETE 3+ VIEW COMPARISON:  None Available. FINDINGS: Degenerative changes of the first [facility] joint are noted. No acute fracture or dislocation is noted. No soft tissue abnormality is seen. IMPRESSION: Degenerative change without acute abnormality Electronically Signed   By: Alcide Clever M.D.   On: 04/07/2023 02:23   DG Humerus Left Result Date: 04/07/2023 CLINICAL DATA:  Recent fall with left arm pain, initial encounter EXAM: LEFT HUMERUS - 2+ VIEW COMPARISON:  None Available. FINDINGS: Degenerative changes of the acromioclavicular joint are seen. No humeral fracture is noted. No dislocation is seen. No soft tissue abnormality is noted. IMPRESSION: No acute abnormality seen. Electronically Signed   By: Alcide Clever M.D.   On: 04/07/2023 02:22   CT Cervical Spine Wo Contrast Result Date: 04/07/2023 CLINICAL DATA:  Neck trauma due to a fall. EXAM: CT CERVICAL SPINE WITHOUT CONTRAST TECHNIQUE: Multidetector CT imaging of the cervical spine was performed without intravenous contrast. Multiplanar CT image reconstructions were also generated. RADIATION DOSE REDUCTION: This exam was performed according to the departmental dose-optimization program which includes automated exposure control, adjustment of the mA and/or kV according to patient size and/or use of iterative reconstruction technique. COMPARISON:  None Available. FINDINGS: Alignment: Anterior subluxations at C4-5 and C5-6 levels. This may be degenerative but could indicate ligamentous injury.  Correlate with physical examination. Consider MRI for evaluation of ligamentous changes. Skull base and vertebrae: Skull base appears intact. No vertebral compression deformities. No focal bone lesion or bone destruction. Soft tissues and spinal canal: No prevertebral fluid or swelling. No visible canal hematoma. Disc levels: Degenerative changes throughout with narrowed interspaces and endplate osteophyte formation. Degenerative changes in the facet joints. Degenerative changes also demonstrated in the temporomandibular joints. Upper chest: Lung apices are clear. Other: None. IMPRESSION: 1. Anterior subluxations demonstrated at C4-5 and C5-6 levels. These may be positional or degenerative but could indicate ligamentous injury. Correlate with physical examination and consider MRI for evaluation of ligamentous changes. 2.  No acute displaced fractures are identified. 3.  Moderate degenerative changes. Electronically Signed   By: Burman Nieves M.D.   On: 04/07/2023 01:56   CT Head Wo Contrast Result Date: 04/07/2023 CLINICAL DATA:  Head trauma, minor. Trip and fall injury. Struck left side of face. Eliquis use. EXAM: CT HEAD WITHOUT CONTRAST TECHNIQUE: Contiguous axial images were obtained from the base of the skull through the vertex without intravenous contrast. RADIATION DOSE REDUCTION: This exam was performed according to the departmental dose-optimization program which includes automated exposure control, adjustment of the mA and/or kV according to patient size and/or use of iterative reconstruction technique. COMPARISON:  MRI brain 02/11/2023 FINDINGS: Brain: Diffuse cerebral atrophy. Ventricular dilatation consistent with central atrophy. Low-attenuation changes in the deep white matter consistent with small vessel ischemia. No abnormal extra-axial fluid collections. No mass effect or midline shift. Gray-white matter junctions are distinct. Basal cisterns are not effaced. No acute intracranial hemorrhage.  Basal ganglia calcifications representing normal variation. Vascular: No hyperdense vessel or unexpected calcification. Skull: Normal. Negative for fracture or focal lesion. Sinuses/Orbits: No acute finding. Other: None. IMPRESSION: No acute intracranial abnormality. Chronic atrophy and small vessel ischemic changes. Electronically Signed   By: Burman Nieves M.D.   On: 04/07/2023 01:52    Procedures Procedures    Medications Ordered in ED Medications  bacitracin 500 UNIT/GM ointment ( Topical Given 04/07/23 0300)    ED Course/ Medical Decision Making/ A&P Clinical Course as of 04/07/23 0330  Sun Apr 07, 2023  0330 Patient and daughter  updated.  She is having some neck pain.  Patient placed in c-collar.  Will obtain MRI.  Neurologically intact. [CH]    Clinical Course User Index [CH] Eulogio Requena, Mayer Masker, MD                                 Medical Decision Making Amount and/or Complexity of Data Reviewed Radiology: ordered.   This patient presents to the ED for concern of fall, this involves an extensive number of treatment options, and is a complaint that carries with it a high risk of complications and morbidity.  I considered the following differential and admission for this acute, potentially life threatening condition.  The differential diagnosis includes acute traumatic injury such as head bleed, neck injury, long bone injury  MDM:    This is a 78 year old female who presents following a fall.  Reports mechanical fall.  She is skin tears to the left upper extremity.  Also some pain in the neck and face.  She is nontoxic-appearing otherwise and ABCs are intact.  X-rays of the left arm do not show any evidence of fractures.  Skin tears were cleaned and dressed.  Tetanus is up-to-date.  CT head neck obtained.  This is notable for some anterior subluxation in the cervical spine.  Given acute tenderness, I requested the patient be placed in a c-collar.  Will obtain MRI.  Neurologically  intact on repeat examination.  No evidence of hip fracture.  (Labs, imaging, consults)  Labs: I Ordered, and personally interpreted labs.  The pertinent results include: N/A  Imaging Studies ordered: I ordered imaging studies including CT head, cervical spine, plain film of the left arm, left hip I independently visualized and interpreted imaging. I agree with the radiologist interpretation  Additional history obtained from daughter at bedside.  External records from outside source obtained and reviewed including prior evaluations  Cardiac Monitoring: The patient was maintained on a cardiac monitor.  If on the cardiac monitor, I personally viewed and interpreted the cardiac monitored which showed an underlying rhythm of: Sinus  Reevaluation: After the interventions noted above, I reevaluated the patient and found that they have :improved  Social Determinants of Health:  lives independently  Disposition: Pending MRI  Co morbidities that complicate the patient evaluation  Past Medical History:  Diagnosis Date   Aortic atherosclerosis (HCC)    Breast cancer (HCC) 2007   Stage I (T1b N0 M0), grade 1 well-differentiated carcinoma of the left breast status, post lumpectomy followed by radiation therapy. Her estrogen receptor receptors were 93%, progesterone receptors 67%. HER-2/neu was negative. No lymphovascular space invasion was seen. All margins were clear. Ki-67 marker was low at 1% with surgery on 11/15/2004. Treated then with post-lumpectomy radiation, finish   Breast cancer, left breast (HCC) 2007   COPD (chronic obstructive pulmonary disease) (HCC)    Coronary artery calcification seen on CAT scan    Diabetes mellitus without complication (HCC)    diet controlled   Diverticula of colon    Hx of rickettsial disease    Hyperlipidemia    Hypertension    Kidney stones    Nicotine dependence    Osteoarthritis      Medicines Meds ordered this encounter  Medications    bacitracin 500 UNIT/GM ointment    Mosteller, Maci N: cabinet override    I have reviewed the patients home medicines and have made adjustments as needed  Problem List /  ED Course: Problem List Items Addressed This Visit   None               Final Clinical Impression(s) / ED Diagnoses Final diagnoses:  None    Rx / DC Orders ED Discharge Orders     None         Eugene Zeiders, Mayer Masker, MD 04/07/23 [phone]

## 2023-04-07 NOTE — ED Provider Notes (Signed)
 Pt signed out that MRI C spine pending - if neg for acute process, to d/c to home.   MRI neg for acute process. Pt comfortable. Nad. Pt appears stable for d/c per Dr Winnifred Friar plan.      Cathren Laine, MD 04/07/23 [phone]

## 2023-04-07 NOTE — Discharge Instructions (Signed)
 It was our pleasure to provide your ER care today - we hope that you feel better.  Fall precautions.   Take acetaminophen as need.  Follow up with your doctor in the next 1-2 weeks.   Return to ER if worse, new symptoms, fevers, new/severe pain, severe headache, weak/fainting, chest pain, trouble breathing, or other concern.

## 2023-04-08 ENCOUNTER — Ambulatory Visit (HOSPITAL_COMMUNITY): Admission: RE | Admit: 2023-04-08 | Payer: 59 | Source: Ambulatory Visit

## 2023-04-09 DIAGNOSIS — Z853 Personal history of malignant neoplasm of breast: Secondary | ICD-10-CM | POA: Diagnosis not present

## 2023-04-09 DIAGNOSIS — C50212 Malignant neoplasm of upper-inner quadrant of left female breast: Secondary | ICD-10-CM | POA: Diagnosis not present

## 2023-04-09 NOTE — Progress Notes (Deleted)
 Assessment/Plan:   1.  Tremor             -Patient had some baseline tremor, likely due to her lung medications, and then was made worse by amiodarone .  Her amiodarone  is now d/c.   Its effects can last up to 6 months after d/c and I believe it was d/c in Jan. -her options are very limited for tremor control.  She is on eliquis  so primidone is not an option.  She is already on a low dose beta blocker.  That takes out all of the first line medications.  The next viable option would be gabapentin   2.  Vision loss             -[name] is concerned about IIH due to partially empty sella.  Partially empty sella is certainly a nonspecific finding, and often times is nonspecific/normal.  IIH would be somewhat unusual in this age group but there is no doubt that the patient has apparently lost significant amounts of vision.  We talked about lumbar puncture along with the risks and benefits.  Patient is on Eliquis  and there is a risk of stroke if we stop this.  In addition, I was told by treating PA that no matter what she is not going to regain vision so she and I discussed goals, even if she does have IIH.  ***she is already on hospice, so we discussed  -I do think that NAION is in the Ddx  -***records indicate that they have done ESR and worked up patient for [facility].  She has no HA.   Subjective:   [name] was seen in work in today.  She initially saw me, as a movement d/o specialist, for tremor.  However, she scheduled a video visit the last day of December to discuss vision issues.  I had no notes about her vision change at that time from her ophthalmologist but was concerned about it, especially since her granddaughter mentioned it started around the time that amiodarone  started.  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  I did mention the vision changes to her cardiologist, who ended up stopping the amiodarone .  This was stopped mid January.  I got a call from Dr.  Marvin [name] office March 11 asking me to work the patient in for vision changes, but unfortunately no one talked to me about the patient.  I was able to see that she had an MRI of the brain and the patient had a partially empty sella and the radiologist put IIH in the differential and ophthalmology felt that might be the reason for her vision change.  I was able to obtain notes from the ophthalmology PA from her January 17 visit.  This noted multiple diagnoses, including "other optic atrophy" bilaterally.  It also noted disc edema bilaterally, but it was unclear how significant that is.   The notes do say that the patient has been seen by [facility] and [facility].  I subsequently called [facility], but was told by Dr. 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  Unfortunately, he had not seen the patient since that time.  I then called Dr. Marvin [name] office and spoke to her PA, [name].  He advised that the patient had an 18-month history of vision loss and he noted that the patient's vision is not going to improve regardless  of etiology.  He stated that he was looking for causes of vision change, but has considered NAION.  He did an MRI of the brain during the workup and he noted that there was a partially empty sella and he felt that perhaps her vision loss was from IIH.  We discussed that this is often an incidental finding and it would be unusual in this age group, especially with an 42-month history of vision loss.  We discussed risks and benefits of spinal tap, especially since she is on Eliquis  and appeared to be on hospice according to records under our media tab.  The patient ended up canceling her appointment with me that we had worked in urgently 2 weeks ago, and ended up rescheduling for today.  Current/Previously tried tremor medications: bisoprolol   Current medications that may exacerbate tremor:   albuterol , atrovent ; symbicort ; amiodarone     Allergies  Allergen Reactions   Penicillins Hives, Shortness Of Breath and Swelling    Has patient had a PCN reaction causing immediate rash, facial/tongue/throat swelling, SOB or lightheadedness with hypotension: yes Has patient had a PCN reaction causing severe rash involving mucus membranes or skin necrosis:no Has patient had a PCN reaction that required hospitalization: yes Has patient had a PCN reaction occurring within the last 10 years: no If all of the above answers are "NO", then may proceed with Cephalosporin use. Tolerates rocephin /keflex    Sulfonamide Derivatives Other (See Comments)    Shaking all over, seizure like symptoms. Hospitalization resulted    Avapro  [Irbesartan ] Other (See Comments)    Pt associates avapro  with bruising though aware scientifically not the case, wants to chanmge med   Levaquin  [Levofloxacin ] Itching    No outpatient medications have been marked as taking for the 04/11/23 encounter (Appointment) with [name], Tanya Grumbling, DO.      Objective:   VITALS:   There were no vitals filed for this visit.  Gen:  Appears stated age and in NAD. HEENT:  Normocephalic, atraumatic. The mucous membranes are moist. The superficial temporal arteries are without ropiness or tenderness. Cardiovascular: Regular rate and rhythm. Lungs: Clear to auscultation bilaterally.  She is wearing oxygen . Neck: There are no carotid bruits noted bilaterally.  NEUROLOGICAL:  Orientation:  The patient is alert and oriented to person and place.  However her history is not always accurate and she relies on her granddaughter for assistance Cranial nerves: There is good facial symmetry. Extraocular muscles are intact and visual fields are full to confrontational testing. Speech is fluent and clear. Soft palate rises symmetrically and there is no tongue deviation. Hearing is intact to conversational tone. Tone: Tone is good  throughout. Sensation: Sensation is intact to light touch touch throughout (facial, trunk, extremities).  Vibration is intact at the bilateral ankle. Coordination:  The patient has no decremation with rapid alternating movements. Motor: Strength is 5/5 in the bilateral upper and lower extremities.  Shoulder shrug is equal bilaterally.  There is no pronator drift.  There are no fasciculations noted. Gait and Station: The patient pushes off to arise.  She is given her walker.  She walks slow and purposeful.  When she is out in the hall, we did take her walker away and she was not shuffling, but again she walks slow and purposeful.  MOVEMENT EXAM: Tremor: there is jaw tremor.  There is some tongue tremor in the mouth.  There is bilateral UE rest tremor, R>L  There is postural tremor.  She has trouble with Archimedes spirals bilaterally.  She is given unknown object to hold to her mouth as if she was drinking it, and she has trouble with this, overall mild to moderate.  I have reviewed and interpreted the following labs independently   Chemistry      Component Value Date/Time   NA 138 04/07/2023 0503   NA 144 02/19/2023 1625   K 3.7 04/07/2023 0503   CL 105 04/07/2023 0503   CO2 26 04/07/2023 0503   BUN 15 04/07/2023 0503   BUN 15 02/19/2023 1625   CREATININE 0.95 04/07/2023 0503   CREATININE 0.89 09/25/2019 0958      Component Value Date/Time   CALCIUM  9.3 04/07/2023 0503   ALKPHOS 82 12/05/2022 1002   AST 18 12/05/2022 1002   ALT 15 12/05/2022 1002   BILITOT 0.3 12/05/2022 1002      Lab Results  Component Value Date   WBC 9.7 04/07/2023   HGB 12.2 04/07/2023   HCT 39.4 04/07/2023   MCV 92.5 04/07/2023   PLT 291 04/07/2023   Lab Results  Component Value Date   TSH 2.650 12/05/2022      Total time spent on today's visit was *** minutes, including both face-to-face time and nonface-to-face time.  Time included that spent on review of records (prior notes available to  me/labs/imaging if pertinent), discussing treatment and goals, answering patient's questions and coordinating care.  CC:  Towanda Fret, MD

## 2023-04-10 ENCOUNTER — Encounter: Payer: Self-pay | Admitting: Orthopaedic Surgery

## 2023-04-10 ENCOUNTER — Other Ambulatory Visit (INDEPENDENT_AMBULATORY_CARE_PROVIDER_SITE_OTHER)

## 2023-04-10 ENCOUNTER — Ambulatory Visit (INDEPENDENT_AMBULATORY_CARE_PROVIDER_SITE_OTHER): Admitting: Orthopaedic Surgery

## 2023-04-10 DIAGNOSIS — S92502A Displaced unspecified fracture of left lesser toe(s), initial encounter for closed fracture: Secondary | ICD-10-CM

## 2023-04-10 DIAGNOSIS — M79672 Pain in left foot: Secondary | ICD-10-CM

## 2023-04-10 NOTE — Progress Notes (Signed)
 I dropped something on my left foot.  She dropped a heavy object on the left foot several days ago.  She has swelling and bruising of the left foot, dorsally and more of the left toe.  She has dorsal swelling of the left foot and third toe.  NV intact.  She is in a wheelchair and prefers not to stand.  X-rays were done of the left foot, reported separately.  Encounter Diagnoses  Name Primary?   Pain in left foot Yes   Closed fracture of phalanx of left third toe, initial encounter    Wear her Crocks.  She prefers them.  Continue her Toradol for pain.  Return in  three weeks.  X-rays of the left foot then.  Call if any problem.  Precautions discussed.  Electronically Signed Darreld Mclean, MD 3/26/202511:00 AM

## 2023-04-11 ENCOUNTER — Ambulatory Visit: Admitting: Neurology

## 2023-04-11 ENCOUNTER — Telehealth: Payer: Self-pay | Admitting: Neurology

## 2023-04-11 ENCOUNTER — Encounter: Payer: Self-pay | Admitting: Neurology

## 2023-04-11 NOTE — Telephone Encounter (Signed)
 Documentation re: appts.  Asked on 3/11 to work patient in re: vision loss (turns out it was an 8 month hx of this but we were not aware at time).  She was given appt on 3/13.  They cx appt 3/12 due to granddaughter unable to bring her.  They r/s to today - 3/27.  Called today 30 min before appt time to cx as she had fx phalanx of left third toe 4 days ago per chart (was at orthopedics yesterday).

## 2023-04-15 DIAGNOSIS — Z515 Encounter for palliative care: Secondary | ICD-10-CM | POA: Diagnosis not present

## 2023-04-15 DIAGNOSIS — J9610 Chronic respiratory failure, unspecified whether with hypoxia or hypercapnia: Secondary | ICD-10-CM | POA: Diagnosis not present

## 2023-04-15 DIAGNOSIS — J449 Chronic obstructive pulmonary disease, unspecified: Secondary | ICD-10-CM | POA: Diagnosis not present

## 2023-04-15 DIAGNOSIS — I5022 Chronic systolic (congestive) heart failure: Secondary | ICD-10-CM | POA: Diagnosis not present

## 2023-04-22 ENCOUNTER — Telehealth: Payer: Self-pay | Admitting: Internal Medicine

## 2023-04-22 ENCOUNTER — Ambulatory Visit: Payer: Self-pay | Admitting: *Deleted

## 2023-04-22 NOTE — Telephone Encounter (Signed)
  Chief Complaint: per daughter [name], not listed on DPR, not with patient at this time,  reports bilateral leg swelling and weeping fluid  Symptoms: bilateral leg swelling, left leg with weeping fluid. Pain reported  Frequency: last night  Pertinent Negatives: Patient denies chest pain or worsening difficulty breathing due to hx COPD and wears oxygen. No redness Disposition: [] ED /[] Urgent Care (no appt availability in office) / [x] Appointment(In office/virtual)/ []  [facility]/ [] Home Care/ [] Refused Recommended Disposition /[] [facility]/ []  Follow-up with PCP Additional Notes:   Patient daughter reports she would like patient to seen at primary dr office. Appt scheduled with other provider in office for tomorrow. Daughter did not want to go to another office. Daughter reports she called 911 last night and EMS recommended patient not to go to hospital due to overcrowding and to f/u with PCP. Please advise.  Recommended if sx worsen chest pain difficulty breathing redness go to ED.     Copied from CRM [id number]. Topic: Clinical - Red Word Triage >> Apr 22, 2023  8:17 AM Izetta Dakin wrote: [facility] that prompted transfer to Nurse Triage: B/l leg swelling, Lt leg draining fluid Reason for Disposition  SEVERE leg swelling (e.g., swelling extends above knee, entire leg is swollen, weeping fluid)  Answer Assessment - Initial Assessment Questions 1. ONSET: "When did the swelling start?" (e.g., minutes, hours, days)     Last night  2. LOCATION: "What part of the leg is swollen?"  "Are both legs swollen or just one leg?"     Left left swelling  3. SEVERITY: "How bad is the swelling?" (e.g., localized; mild, moderate, severe)   - Localized: Small area of swelling localized to one leg.   - MILD pedal edema: Swelling limited to foot and ankle, pitting edema < 1/4 inch (6 mm) deep, rest and elevation eliminate most or all swelling.   - MODERATE edema: Swelling of lower leg to knee,  pitting edema > 1/4 inch (6 mm) deep, rest and elevation only partially reduce swelling.   - SEVERE edema: Swelling extends above knee, facial or hand swelling present.      Moderate under knees 4. REDNESS: "Does the swelling look red or infected?"     No  5. PAIN: "Is the swelling painful to touch?" If Yes, ask: "How painful is it?"   (Scale 1-10; mild, moderate or severe)     Yes pain 6. FEVER: "Do you have a fever?" If Yes, ask: "What is it, how was it measured, and when did it start?"      na 7. CAUSE: "What do you think is causing the leg swelling?"     Not sure  8. MEDICAL HISTORY: "Do you have a history of blood clots (e.g., DVT), cancer, heart failure, kidney disease, or liver failure?"     Yes see hx  9. RECURRENT SYMPTOM: "Have you had leg swelling before?" If Yes, ask: "When was the last time?" "What happened that time?"     Yes  10. OTHER SYMPTOMS: "Do you have any other symptoms?" (e.g., chest pain, difficulty breathing)       Bilateral leg swelling left leg weeping fluid  11. PREGNANCY: "Is there any chance you are pregnant?" "When was your last menstrual period?"       na  Protocols used: Leg Swelling and Edema-A-AH

## 2023-04-22 NOTE — Telephone Encounter (Signed)
 04/15/23 Office Note from [facility] forwarded to chart for Dr. Sherene Sires to review

## 2023-04-23 ENCOUNTER — Encounter: Payer: Self-pay | Admitting: Internal Medicine

## 2023-04-23 ENCOUNTER — Ambulatory Visit (INDEPENDENT_AMBULATORY_CARE_PROVIDER_SITE_OTHER): Admitting: Internal Medicine

## 2023-04-23 VITALS — BP 134/76 | HR 62 | Ht 65.0 in | Wt 189.4 lb

## 2023-04-23 DIAGNOSIS — I4891 Unspecified atrial fibrillation: Secondary | ICD-10-CM | POA: Diagnosis not present

## 2023-04-23 DIAGNOSIS — I5020 Unspecified systolic (congestive) heart failure: Secondary | ICD-10-CM | POA: Diagnosis not present

## 2023-04-23 DIAGNOSIS — M7989 Other specified soft tissue disorders: Secondary | ICD-10-CM | POA: Insufficient documentation

## 2023-04-23 DIAGNOSIS — I872 Venous insufficiency (chronic) (peripheral): Secondary | ICD-10-CM

## 2023-04-23 MED ORDER — SPIRONOLACTONE 25 MG PO TABS: 25.0000 mg | ORAL_TABLET | Freq: Every day | ORAL | 1 refills | Status: DC

## 2023-04-23 NOTE — Assessment & Plan Note (Signed)
 Leg swelling is multifactorial - somewhat due to chronic venous insufficiency Leg elevation advised - has hospital bed at home Continue Lasix 40 mg X 3 days and then 20 mg QD

## 2023-04-23 NOTE — Assessment & Plan Note (Addendum)
 Likely multifactorial, due to CHF, chronic venous insufficiency and lack of mobility, less concerning for cellulitis or DVT Perform leg elevation as tolerated Continue Lasix 40 mg X 3 days and then 20 mg once daily Check BNP

## 2023-04-23 NOTE — Assessment & Plan Note (Signed)
 Appears volume overloaded Continue Lasix 40 mg X 3 days and then 20 mg QD Restart spironolactone 25 mg once daily Continue Farxiga 10 mg QD and Entresto 24-26 mg twice daily Follow up with cardiology Check BMP and BNP

## 2023-04-23 NOTE — Assessment & Plan Note (Signed)
 Rate controlled with Zebeta 5 mg QD On Eliquis 5 mg twice daily for [facility]

## 2023-04-23 NOTE — Progress Notes (Signed)
 Acute Office Visit  Subjective:    Patient ID: [name], female    DOB: 07-12-1945, 78 y.o.   MRN: [id number]  Chief Complaint  Patient presents with   Leg Swelling    Pt reports sx of bilateral leg swelling have some oozing on the left leg, noticed it on 04/21/23    HPI Patient is in today for complaint of bilateral leg swelling, left > right for the last 3 days.  She has had weeping edema of the left leg.  Denies any redness or pain of the left leg currently.  She takes Lasix 20 mg QD currently.  She has limited mobility, but denies any prolonged immobilization.  Denies recent worsening of dyspnea, orthopnea or PND currently.  She has a history of HFimpEF - LVEF of 55-60%.  Apparently, she has run out of spironolactone.  Denies any fever, chills.  Past Medical History:  Diagnosis Date   Aortic atherosclerosis (HCC)    Breast cancer (HCC) 2007   Stage I (T1b N0 M0), grade 1 well-differentiated carcinoma of the left breast status, post lumpectomy followed by radiation therapy. Her estrogen receptor receptors were 93%, progesterone receptors 67%. HER-2/neu was negative. No lymphovascular space invasion was seen. All margins were clear. Ki-67 marker was low at 1% with surgery on 11/15/2004. Treated then with post-lumpectomy radiation, finish   Breast cancer, left breast (HCC) 2007   COPD (chronic obstructive pulmonary disease) (HCC)    Coronary artery calcification seen on CAT scan    Diabetes mellitus without complication (HCC)    diet controlled   Diverticula of colon    Hx of rickettsial disease    Hyperlipidemia    Hypertension    Kidney stones    Nicotine dependence    Osteoarthritis     Past Surgical History:  Procedure Laterality Date   ABDOMINAL HYSTERECTOMY     BREAST SURGERY  2005 approx   left lumpectomy    CATARACT EXTRACTION W/PHACO Left 05/01/2019   Procedure: CATARACT EXTRACTION PHACO AND INTRAOCULAR LENS PLACEMENT LEFT EYE CDE=11.73;  Surgeon: Fabio Pierce, MD;  Location: [facility];  Service: Ophthalmology;  Laterality: Left;  left   CATARACT EXTRACTION W/PHACO Right 05/15/2019   Procedure: CATARACT EXTRACTION PHACO AND INTRAOCULAR LENS PLACEMENT RIGHT EYE;  Surgeon: Fabio Pierce, MD;  Location: [facility];  Service: Ophthalmology;  Laterality: Right;  CDE: 16.07   CHOLECYSTECTOMY N/A 05/26/2014   Procedure: LAPAROSCOPIC CHOLECYSTECTOMY;  Surgeon: Franky Macho Md, MD;  Location: [facility];  Service: General;  Laterality: N/A;   COLECTOMY     2005, diverticulitis   COLONOSCOPY N/A 05/20/2017    Surgeon: West Bali, MD;  five 3-6 mm polyps (4 tubular adenomas, 1 hyperplastic polyp), tortuous left colon, internal hemorrhoids.  Recommended repeat colonoscopy in 3 years.   COLONOSCOPY WITH PROPOFOL N/A 07/12/2020   Procedure: COLONOSCOPY WITH PROPOFOL;  Surgeon: Lanelle Bal, DO;  Location: [facility];  Service: Endoscopy;  Laterality: N/A;  12:30pm   DILATION AND CURETTAGE OF UTERUS     ESOPHAGOGASTRODUODENOSCOPY N/A 05/20/2017   Surgeon: West Bali, MD; normal esophagus and mild gastritis due to aspirin.  Biopsies negative for H. pylori.   left breast      cancer, in 2006   POLYPECTOMY  07/12/2020   Procedure: POLYPECTOMY;  Surgeon: Lanelle Bal, DO;  Location: [facility];  Service: Endoscopy;;   TUBAL LIGATION      Family History  Problem Relation Age of Onset  Breast cancer Mother    COPD Father    Emphysema Father    Throat cancer Sister    Glaucoma Brother    Schizophrenia Brother    Heart attack Brother    Lung cancer Sister        former smoker   Hepatitis C Daughter    Seizures Daughter    SIDS Son    Colon cancer Neg Hx    Colon polyps Neg Hx     Social History   Socioeconomic History   Marital status: Single    Spouse name: Not on file   Number of children: 5   Years of education: 9th grade    Highest education level: 9th grade  Occupational History   Occupation: retired    Associate Professor: RETIRED   Tobacco Use   Smoking status: Former    Current packs/day: 0.00    Average packs/day: 0.5 packs/day for 50.0 years (25.0 ttl pk-yrs)    Types: Cigarettes    Start date: 04/08/1972    Quit date: 04/09/2022    Years since quitting: 1.0   Smokeless tobacco: Never   Tobacco comments:    smokes 1/2 pack per day 11/19/2019  Vaping Use   Vaping status: Never Used  Substance and Sexual Activity   Alcohol use: No    Alcohol/week: 0.0 standard drinks of alcohol   Drug use: No   Sexual activity: Not Currently  Other Topics Concern   Not on file  Social History Narrative   ** Merged History Encounter ** 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 QUIT WORKING 2003 AFTER CANCER DIAGNOSIS.      MARRIED WITH A RUN AWAY HUSBAND(FUGITIVE 10 YRS). 3 MISCARRIAGES, 5 CHILDREN   Right handed    Social Drivers of Health   Financial Resource Strain: Low Risk  (02/26/2023)   Overall Financial Resource Strain (CARDIA)    Difficulty of Paying Living Expenses: Not hard at all  Food Insecurity: No Food Insecurity (02/26/2023)   Hunger Vital Sign    Worried About Running Out of Food in the Last Year: Never true    Ran Out of Food in the Last Year: Never true  Transportation Needs: No Transportation Needs (02/26/2023)   PRAPARE - Administrator, Civil Service (Medical): No    Lack of Transportation (Non-Medical): No  Physical Activity: Patient Declined (02/26/2023)   Exercise Vital Sign    Days of Exercise per Week: Patient declined    Minutes of Exercise per Session: Patient declined  Stress: No Stress Concern Present (02/26/2023)   [employer] of Occupational Health - Occupational Stress Questionnaire    Feeling of Stress : Not at all  Social Connections: Socially Isolated (02/26/2023)   Social Connection and Isolation Panel [NHANES]    Frequency of Communication with Friends and Family: More than three times a week    Frequency of Social Gatherings with Friends and  Family: More than three times a week    Attends Religious Services: Never    Database administrator or Organizations: No    Attends Banker Meetings: Never    Marital Status: Divorced  Catering manager Violence: Not At Risk (02/26/2023)   Humiliation, Afraid, Rape, and Kick questionnaire    Fear of Current or Ex-Partner: No    Emotionally Abused: No    Physically Abused: No    Sexually Abused: No    Outpatient Medications Prior to Visit  Medication Sig Dispense Refill   acetaminophen (  TYLENOL) 325 MG tablet Take 650 mg by mouth every 6 (six) hours as needed for moderate pain.      albuterol (PROVENTIL) (2.5 MG/3ML) 0.083% nebulizer solution INHALE ONE VIAL VIA NEBULIZER THREE TIMES DAILY. (Patient taking differently: Take 2.5 mg by nebulization 3 (three) times daily as needed for wheezing or shortness of breath.) 300 mL 0   albuterol (VENTOLIN HFA) 108 (90 Base) MCG/ACT inhaler INHALE (2) PUFFS EVERY FOURHOURS AS NEEDED ONLY IF YOU CANT CATCH YOUR BREATH (Patient taking differently: Inhale 2 puffs into the lungs every 4 (four) hours as needed for wheezing or shortness of breath. INHALE (2) PUFFS EVERY FOURHOURS AS NEEDED ONLY IF YOU CANT CATCH YOUR BREATH) 8.5 g 0   ATROVENT HFA 17 MCG/ACT inhaler INHALE 2 PUFFS BY MOUTH FOUR TIMES A DAY. 12.9 g 0   bisoprolol (ZEBETA) 5 MG tablet Take 1 tablet (5 mg total) by mouth daily. 30 tablet 5   Calcium Carbonate-Vitamin D (CALCIUM 600 + D PO) Take 1 tablet by mouth 2 (two) times daily.     Cyanocobalamin (VITAMIN B 12) 500 MCG TABS Take 500 mcg by mouth daily. Take one tablet by mouth once daily 90 tablet 3   cyclobenzaprine (FLEXERIL) 5 MG tablet Take 1 tablet (5 mg total) by mouth at bedtime. 30 tablet 5   ELIQUIS 5 MG TABS tablet TAKE 1 TABLET BY MOUTH TWICE A DAY 60 tablet 5   ENTRESTO 24-26 MG Take 1 tablet by mouth 2 (two) times daily. 60 tablet 5   erythromycin ophthalmic ointment Place 1 Application into both eyes at bedtime.  Apply daily at bedtime to both eyes for 1 week 3.5 g 0   FARXIGA 10 MG TABS tablet Take 1 tablet (10 mg total) by mouth daily. 30 tablet 5   fluticasone (FLONASE) 50 MCG/ACT nasal spray PLACE 2 SPRAYS IN EACH NOSTRIL DAILY. 16 g 5   furosemide (LASIX) 20 MG tablet Take 1 tablet (20 mg total) by mouth daily. 30 tablet 3   hydrOXYzine (ATARAX) 25 MG tablet Take 1 tablet (25 mg total) by mouth 2 (two) times daily as needed for itching. 60 tablet 1   Misc. Devices MISC Please provide patient with mastectomy bra and prosthesis. Dx: History of left breast cancer 1 each 0   Naphazoline HCl (CLEAR EYES OP) Place 1 drop into both eyes daily as needed (irritation).     pantoprazole (PROTONIX) 40 MG tablet Take 1 tablet (40 mg total) by mouth every morning. 90 tablet 0   polyethylene glycol powder (GLYCOLAX/MIRALAX) powder MIX 1 CAPFUL IN 8 OUNCES OF JUICE OR WATER AND DRINK ONCE DAILY. (Patient taking differently: Take 17 g by mouth daily.) 527 g 5   rosuvastatin (CRESTOR) 20 MG tablet Take 1 tablet (20 mg total) by mouth daily. 90 tablet 3   SYMBICORT 160-4.5 MCG/ACT inhaler Inhale 2 puffs into the lungs 2 (two) times daily. 10.2 g 0   traMADol (ULTRAM) 50 MG tablet TAKE (1) TABLET BY MOUTH EVERY 8 HOURS AS NEEDED. 60 tablet 3   spironolactone (ALDACTONE) 25 MG tablet Take 1 tablet (25 mg total) by mouth daily. 30 tablet 0   No facility-administered medications prior to visit.    Allergies  Allergen Reactions   Penicillins Hives, Shortness Of Breath and Swelling    Has patient had a PCN reaction causing immediate rash, facial/tongue/throat swelling, SOB or lightheadedness with hypotension: yes Has patient had a PCN reaction causing severe rash involving mucus membranes or skin  necrosis:no Has patient had a PCN reaction that required hospitalization: yes Has patient had a PCN reaction occurring within the last 10 years: no If all of the above answers are "NO", then may proceed with Cephalosporin  use. Tolerates rocephin/keflex   Sulfonamide Derivatives Other (See Comments)    Shaking all over, seizure like symptoms. Hospitalization resulted    Avapro [Irbesartan] Other (See Comments)    Pt associates avapro with bruising though aware scientifically not the case, wants to chanmge med   Levaquin [Levofloxacin] Itching    Review of Systems  Constitutional:  Positive for fatigue. Negative for chills and fever.  HENT:  Negative for congestion and sore throat.   Eyes:  Negative for pain and discharge.  Respiratory:  Negative for cough and shortness of breath.   Cardiovascular:  Positive for leg swelling. Negative for chest pain and palpitations.  Gastrointestinal:  Negative for abdominal pain, diarrhea, nausea and vomiting.  Endocrine: Negative for polydipsia and polyuria.  Genitourinary:  Negative for dysuria and hematuria.  Musculoskeletal:  Negative for neck pain and neck stiffness.  Skin:  Negative for rash.  Neurological:  Negative for dizziness and weakness.  Psychiatric/Behavioral:  Negative for agitation and behavioral problems.        Objective:    Physical Exam Vitals reviewed.  Constitutional:      General: She is not in acute distress.    Appearance: She is obese. She is not diaphoretic.     Comments: In wheelchair  HENT:     Head: Normocephalic and atraumatic.     Nose: Nose normal.     Mouth/Throat:     Mouth: Mucous membranes are moist.  Eyes:     General: No scleral icterus.    Extraocular Movements: Extraocular movements intact.  Cardiovascular:     Rate and Rhythm: Normal rate and regular rhythm.     Heart sounds: Normal heart sounds. No murmur heard. Pulmonary:     Breath sounds: Normal breath sounds. No wheezing or rales.  Musculoskeletal:     Right lower leg: Edema (1+) present.     Left lower leg: Edema (2+) present.  Skin:    General: Skin is warm.     Findings: No rash.  Neurological:     General: No focal deficit present.     Mental  Status: She is alert and oriented to person, place, and time.  Psychiatric:        Mood and Affect: Mood normal.        Behavior: Behavior normal.     BP 134/76 (BP Location: Left Arm)   Pulse 62   Ht 5\' 5"  (1.651 m)   Wt 189 lb 6.4 oz (85.9 kg)   SpO2 92%   BMI 31.52 kg/m  Wt Readings from Last 3 Encounters:  04/23/23 189 lb 6.4 oz (85.9 kg)  04/03/23 187 lb (84.8 kg)  02/28/23 188 lb (85.3 kg)        Assessment & Plan:   Problem List Items Addressed This Visit       Cardiovascular and Mediastinum   HFrEF (heart failure with reduced ejection fraction) (HCC) - Primary   Appears volume overloaded Continue Lasix 40 mg X 3 days and then 20 mg QD Restart spironolactone 25 mg once daily Continue Farxiga 10 mg QD and Entresto 24-26 mg twice daily Follow up with cardiology Check BMP and BNP      Relevant Medications   spironolactone (ALDACTONE) 25 MG tablet   Other Relevant Orders  Basic Metabolic Panel (BMET)   B Nat Peptide   Atrial fibrillation (HCC)   Rate controlled with Zebeta 5 mg QD On Eliquis 5 mg twice daily for [facility]      Relevant Medications   spironolactone (ALDACTONE) 25 MG tablet   Chronic venous insufficiency of lower extremity   Leg swelling is multifactorial - somewhat due to chronic venous insufficiency Leg elevation advised - has hospital bed at home Continue Lasix 40 mg X 3 days and then 20 mg QD      Relevant Medications   spironolactone (ALDACTONE) 25 MG tablet     Other   Leg swelling   Likely multifactorial, due to CHF, chronic venous insufficiency and lack of mobility, less concerning for cellulitis or DVT Perform leg elevation as tolerated Continue Lasix 40 mg X 3 days and then 20 mg once daily Check BNP         Meds ordered this encounter  Medications   spironolactone (ALDACTONE) 25 MG tablet    Sig: Take 1 tablet (25 mg total) by mouth daily.    Dispense:  90 tablet    Refill:  1     Quinta Eimer Concha Se, MD

## 2023-04-23 NOTE — Patient Instructions (Signed)
 Please start taking Spironolactone as prescribed.  Please take Lasix 20 mg 2 tablets for next 3 days and then once daily.  Please continue taking other medications as prescribed.  Please perform leg elevation as tolerated.

## 2023-04-25 LAB — BASIC METABOLIC PANEL WITH GFR
BUN/Creatinine Ratio: 15 (ref 12–28)
BUN: 17 mg/dL (ref 8–27)
CO2: 26 mmol/L (ref 20–29)
Calcium: 9.9 mg/dL (ref 8.7–10.3)
Chloride: 101 mmol/L (ref 96–106)
Creatinine, Ser: 1.11 mg/dL — ABNORMAL HIGH (ref 0.57–1.00)
Glucose: 99 mg/dL (ref 70–99)
Potassium: 5.3 mmol/L — ABNORMAL HIGH (ref 3.5–5.2)
Sodium: 144 mmol/L (ref 134–144)
eGFR: 51 mL/min/{1.73_m2} — ABNORMAL LOW (ref >59)

## 2023-04-25 LAB — BRAIN NATRIURETIC PEPTIDE: BNP: 72.5 pg/mL (ref 0.0–100.0)

## 2023-04-26 ENCOUNTER — Encounter: Payer: Self-pay | Admitting: Family Medicine

## 2023-04-26 DIAGNOSIS — S99922A Unspecified injury of left foot, initial encounter: Secondary | ICD-10-CM | POA: Insufficient documentation

## 2023-04-26 NOTE — Assessment & Plan Note (Signed)
 Possible fracture will have ortho eval asap,  able to weight bear, defer imaging to Ortho

## 2023-05-01 ENCOUNTER — Encounter: Payer: Self-pay | Admitting: Orthopaedic Surgery

## 2023-05-01 ENCOUNTER — Other Ambulatory Visit (INDEPENDENT_AMBULATORY_CARE_PROVIDER_SITE_OTHER)

## 2023-05-01 ENCOUNTER — Ambulatory Visit (INDEPENDENT_AMBULATORY_CARE_PROVIDER_SITE_OTHER): Admitting: Orthopaedic Surgery

## 2023-05-01 DIAGNOSIS — S92502A Displaced unspecified fracture of left lesser toe(s), initial encounter for closed fracture: Secondary | ICD-10-CM | POA: Diagnosis not present

## 2023-05-01 DIAGNOSIS — S92502D Displaced unspecified fracture of left lesser toe(s), subsequent encounter for fracture with routine healing: Secondary | ICD-10-CM | POA: Diagnosis not present

## 2023-05-01 NOTE — Progress Notes (Signed)
 I feel ok.  She is doing well with the fracture of the left third toe.  She has no pain, NV intact, ROM is good.  X-rays were done of the left foot, reported separately.  Encounter Diagnosis  Name Primary?   Closed fracture of phalanx of left third toe with routine healing, subsequent encounter Yes   Return in three weeks.  X-rays then.  Call if any problem.  Precautions discussed.  Electronically Signed Pleasant Brilliant, MD 4/16/202511:11 AM

## 2023-05-02 DIAGNOSIS — J441 Chronic obstructive pulmonary disease with (acute) exacerbation: Secondary | ICD-10-CM | POA: Diagnosis not present

## 2023-05-02 DIAGNOSIS — M5410 Radiculopathy, site unspecified: Secondary | ICD-10-CM | POA: Diagnosis not present

## 2023-05-02 DIAGNOSIS — J9601 Acute respiratory failure with hypoxia: Secondary | ICD-10-CM | POA: Diagnosis not present

## 2023-05-02 DIAGNOSIS — I5021 Acute systolic (congestive) heart failure: Secondary | ICD-10-CM | POA: Diagnosis not present

## 2023-05-03 ENCOUNTER — Other Ambulatory Visit: Payer: Self-pay | Admitting: Family Medicine

## 2023-05-03 DIAGNOSIS — R918 Other nonspecific abnormal finding of lung field: Secondary | ICD-10-CM

## 2023-05-03 DIAGNOSIS — J4489 Other specified chronic obstructive pulmonary disease: Secondary | ICD-10-CM

## 2023-05-07 ENCOUNTER — Other Ambulatory Visit: Payer: Self-pay | Admitting: Family Medicine

## 2023-05-08 ENCOUNTER — Ambulatory Visit: Payer: 59 | Admitting: Neurology

## 2023-05-13 ENCOUNTER — Other Ambulatory Visit: Payer: Self-pay | Admitting: Family Medicine

## 2023-05-13 DIAGNOSIS — J4489 Other specified chronic obstructive pulmonary disease: Secondary | ICD-10-CM

## 2023-05-13 DIAGNOSIS — R918 Other nonspecific abnormal finding of lung field: Secondary | ICD-10-CM

## 2023-05-16 DIAGNOSIS — Z515 Encounter for palliative care: Secondary | ICD-10-CM | POA: Diagnosis not present

## 2023-05-16 DIAGNOSIS — I5022 Chronic systolic (congestive) heart failure: Secondary | ICD-10-CM | POA: Diagnosis not present

## 2023-05-16 DIAGNOSIS — J449 Chronic obstructive pulmonary disease, unspecified: Secondary | ICD-10-CM | POA: Diagnosis not present

## 2023-05-16 DIAGNOSIS — J9610 Chronic respiratory failure, unspecified whether with hypoxia or hypercapnia: Secondary | ICD-10-CM | POA: Diagnosis not present

## 2023-05-20 NOTE — Progress Notes (Unsigned)
 Assessment/Plan:    Tremor             -Patient had some baseline tremor, likely due to her lung medications, and then was made worse by amiodarone .  Her amiodarone  is now d/c.   Its effects can last up to 6 months after d/c and I believe it was d/c in Jan. -her options are very limited for tremor control.  She is on eliquis  so primidone is not an option.  She is already on a low dose beta blocker.  That takes out all of the first line medications.  The next viable option would be gabapentin   2.  Vision loss             -[name] is concerned about IIH due to partially empty sella.  Partially empty sella is certainly a nonspecific finding, and often times is nonspecific/normal.  IIH would be somewhat unusual in this age group but there is no doubt that the patient has apparently lost significant amounts of vision.  We talked about lumbar puncture along with the risks and benefits.  Patient is on Eliquis  and there is a risk of stroke if we stop this.  In addition, I was told by treating PA that no matter what she is not going to regain vision so she and I discussed goals, even if she does have IIH.  ***she is already on hospice, so we discussed             -I do think that NAION is in the Ddx             -***records indicate that they have done ESR and worked up patient for [facility].  She has no HA.  Subjective:   [name] was seen today in follow up for tremor.  My previous records were reviewed prior to todays visit.  Granddaughter with pt and supplements hx.  She initially saw me, as a movement d/o specialist, for tremor.  However, she scheduled a video visit the last day of December to discuss vision issues.  I had no notes about her vision change at that time from her ophthalmologist but was concerned about it, especially since her granddaughter mentioned it started around the time that amiodarone  started.  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  I did mention the  vision changes to her cardiologist, who ended up stopping the amiodarone .  This was stopped mid January.  I got a call from Dr. Marvin Slot office March 11 asking me to work the patient in for vision changes, but unfortunately no one talked to me about the patient.  I was able to see that she had an MRI of the brain and the patient had a partially empty sella and the radiologist put IIH in the differential and ophthalmology felt that might be the reason for her vision change.  I was able to obtain notes from the ophthalmology PA from her January 17 visit.  This noted multiple diagnoses, including "other optic atrophy" bilaterally.  It also noted disc edema bilaterally, but it was unclear how significant that is.   The notes do say that the patient has been seen by [facility] and [place] [facility].  I subsequently called [place] [facility], but was told by Dr. Elnita Hai that he had not seen the patient since July, 2024, at which point the patient did have left anterior ischemic optic neuropathy.  Unfortunately, he had not seen the patient since that time.  I then called Dr. Marvin Slot office and spoke to her PA, [name].  He advised that the patient had an 33-month history of vision loss and he noted that the patient's vision is not going to improve regardless of etiology.  He stated that he was looking for causes of vision change, but has considered NAION.  He did an MRI of the brain during the workup and he noted that there was a partially empty sella and he felt that perhaps her vision loss was from IIH.  We discussed risks and benefits of doing a spinal tap and her, especially given that she is on Eliquis  (and notes indicate in hospice) and he noted that patient would not regain vision even with treatment.  Patient has no headache.  I did ask [name] if he would just consider putting her on Diamox, but also told them I would make a follow-up with the patient.  She had an appointment at the end of March but  canceled it.    PREVIOUS MEDICATIONS: none to date  ALLERGIES:   Allergies  Allergen Reactions   Penicillins Hives, Shortness Of Breath and Swelling    Has patient had a PCN reaction causing immediate rash, facial/tongue/throat swelling, SOB or lightheadedness with hypotension: yes Has patient had a PCN reaction causing severe rash involving mucus membranes or skin necrosis:no Has patient had a PCN reaction that required hospitalization: yes Has patient had a PCN reaction occurring within the last 10 years: no If all of the above answers are "NO", then may proceed with Cephalosporin use. Tolerates rocephin /keflex    Sulfonamide Derivatives Other (See Comments)    Shaking all over, seizure like symptoms. Hospitalization resulted    Avapro  [Irbesartan ] Other (See Comments)    Pt associates avapro  with bruising though aware scientifically not the case, wants to chanmge med   Levaquin  [Levofloxacin ] Itching    CURRENT MEDICATIONS:  No outpatient medications have been marked as taking for the 05/21/23 encounter (Appointment) with Genifer Lazenby, [place], DO.      Objective:    PHYSICAL EXAMINATION:    VITALS:  There were no vitals filed for this visit.  GEN:  The patient appears stated age and is in NAD.  Neurological examination:  Orientation: The patient is alert and oriented x3. Cranial nerves: There is good facial symmetry. The speech is fluent and clear. Hearing is intact to conversational tone. Motor: Strength is at least antigravity x4.  Movement examination:  Abnormal movements: there is jaw tremor.  There is postural tremor, right greater than left.  There is intention tremor, right greater than left.   I have reviewed and interpreted the following labs independently   Chemistry      Component Value Date/Time   NA 144 04/23/2023 1421   K 5.3 (H) 04/23/2023 1421   CL 101 04/23/2023 1421   CO2 26 04/23/2023 1421   BUN 17 04/23/2023 1421   CREATININE 1.11 (H) 04/23/2023  1421   CREATININE 0.89 09/25/2019 0958      Component Value Date/Time   CALCIUM  9.9 04/23/2023 1421   ALKPHOS 82 12/05/2022 1002   AST 18 12/05/2022 1002   ALT 15 12/05/2022 1002   BILITOT 0.3 12/05/2022 1002      Lab Results  Component Value Date   WBC 9.7 04/07/2023   HGB 12.2 04/07/2023   HCT 39.4 04/07/2023   MCV 92.5 04/07/2023   PLT 291 04/07/2023   Lab Results  Component Value Date   TSH 2.650 12/05/2022  Chemistry      Component Value Date/Time   NA 144 04/23/2023 1421   K 5.3 (H) 04/23/2023 1421   CL 101 04/23/2023 1421   CO2 26 04/23/2023 1421   BUN 17 04/23/2023 1421   CREATININE 1.11 (H) 04/23/2023 1421   CREATININE 0.89 09/25/2019 0958      Component Value Date/Time   CALCIUM  9.9 04/23/2023 1421   ALKPHOS 82 12/05/2022 1002   AST 18 12/05/2022 1002   ALT 15 12/05/2022 1002   BILITOT 0.3 12/05/2022 1002       Follow up Instructions      -I discussed the assessment and treatment plan with the patient. The patient was provided an opportunity to ask questions and all were answered. The patient agreed with the plan and demonstrated an understanding of the instructions.   The patient was advised to call back or seek an in-person evaluation if the symptoms worsen or if the condition fails to improve as anticipated.    Total time spent on today's visit was 30 minutes, including both face-to-face time and nonface-to-face time.  Time included that spent on review of records (prior notes available to me/labs/imaging if pertinent), discussing treatment and goals, answering patient's questions and coordinating care.   Fran Imus, DO   Cc:  Towanda Fret, MD

## 2023-05-21 ENCOUNTER — Encounter: Payer: Self-pay | Admitting: Neurology

## 2023-05-21 ENCOUNTER — Ambulatory Visit (INDEPENDENT_AMBULATORY_CARE_PROVIDER_SITE_OTHER): Admitting: Neurology

## 2023-05-21 VITALS — BP 116/72 | Ht 65.0 in | Wt 189.0 lb

## 2023-05-21 DIAGNOSIS — H547 Unspecified visual loss: Secondary | ICD-10-CM | POA: Diagnosis not present

## 2023-05-22 ENCOUNTER — Encounter: Admitting: Orthopaedic Surgery

## 2023-05-30 ENCOUNTER — Other Ambulatory Visit: Payer: Self-pay | Admitting: Family Medicine

## 2023-05-30 DIAGNOSIS — J4489 Other specified chronic obstructive pulmonary disease: Secondary | ICD-10-CM

## 2023-05-30 DIAGNOSIS — R918 Other nonspecific abnormal finding of lung field: Secondary | ICD-10-CM

## 2023-06-01 DIAGNOSIS — J9601 Acute respiratory failure with hypoxia: Secondary | ICD-10-CM | POA: Diagnosis not present

## 2023-06-01 DIAGNOSIS — I5021 Acute systolic (congestive) heart failure: Secondary | ICD-10-CM | POA: Diagnosis not present

## 2023-06-01 DIAGNOSIS — J441 Chronic obstructive pulmonary disease with (acute) exacerbation: Secondary | ICD-10-CM | POA: Diagnosis not present

## 2023-06-01 DIAGNOSIS — M5410 Radiculopathy, site unspecified: Secondary | ICD-10-CM | POA: Diagnosis not present

## 2023-06-04 ENCOUNTER — Encounter: Payer: Self-pay | Admitting: Cardiology

## 2023-06-04 ENCOUNTER — Ambulatory Visit: Payer: 59 | Admitting: Cardiology

## 2023-06-04 ENCOUNTER — Ambulatory Visit: Payer: 59 | Attending: Cardiology | Admitting: Cardiology

## 2023-06-04 VITALS — BP 136/74 | HR 74 | Ht 65.0 in | Wt 190.6 lb

## 2023-06-04 DIAGNOSIS — I10 Essential (primary) hypertension: Secondary | ICD-10-CM

## 2023-06-04 DIAGNOSIS — I2510 Atherosclerotic heart disease of native coronary artery without angina pectoris: Secondary | ICD-10-CM | POA: Diagnosis not present

## 2023-06-04 DIAGNOSIS — D6869 Other thrombophilia: Secondary | ICD-10-CM

## 2023-06-04 DIAGNOSIS — I5032 Chronic diastolic (congestive) heart failure: Secondary | ICD-10-CM | POA: Diagnosis not present

## 2023-06-04 DIAGNOSIS — I4891 Unspecified atrial fibrillation: Secondary | ICD-10-CM | POA: Diagnosis not present

## 2023-06-04 DIAGNOSIS — E782 Mixed hyperlipidemia: Secondary | ICD-10-CM | POA: Diagnosis not present

## 2023-06-04 MED ORDER — FUROSEMIDE 20 MG PO TABS: ORAL_TABLET | ORAL | 3 refills | Status: DC

## 2023-06-04 NOTE — Patient Instructions (Signed)
 Medication Instructions:  Your physician has recommended you make the following change in your medication:   -Change Lasix  to 20 mg once daily. May take an additional tablet as needed for fluid/weight gain  *If you need a refill on your cardiac medications before your next appointment, please call your pharmacy*  Lab Work: None If you have labs (blood work) drawn today and your tests are completely normal, you will receive your results only by: MyChart Message (if you have MyChart) OR A paper copy in the mail If you have any lab test that is abnormal or we need to change your treatment, we will call you to review the results.  Testing/Procedures: None  Follow-Up: At [facility], you and your health needs are our priority.  As part of our continuing mission to provide you with exceptional heart care, our providers are all part of one team.  This team includes your primary Cardiologist (physician) and Advanced Practice Providers or APPs (Physician Assistants and Nurse Practitioners) who all work together to provide you with the care you need, when you need it.  Your next appointment:   6 month(s)  Provider:   You may see Armida Lander, MD or one of the following Advanced Practice Providers on your designated Care Team:   Woodfin Hays, PA-C  [place], [place] Theotis Flake, [place]     We recommend signing up for the patient portal called "MyChart".  Sign up information is provided on this After Visit Summary.  MyChart is used to connect with patients for Virtual Visits (Telemedicine).  Patients are able to view lab/test results, encounter notes, upcoming appointments, etc.  Non-urgent messages can be sent to your provider as well.   To learn more about what you can do with MyChart, go to [email or web address].   Other Instructions

## 2023-06-04 NOTE — Progress Notes (Signed)
 Clinical Summary Ms. Golding is a 78 y.o.female seen today for follow up of the following medical problems.    1. CAD - noted on CT 09/2017, incidental findings. Multivessel and LM disease - can have some chest pain. Sharp pain left side, 8/10 in severity. Can occur any time. No SOB. Lasts a few seconds - had breast cancer in that area, lumpectomy  CAD risk factors: +smoker roughly 50 years, HL, HTN   11/2017 nuclear stress: no ischemia - no recent chest pains.      2.HFimpEF -03/2022 echo: LVEF 35-40%. This was in the setting of pneumonia, COPD exacerbation in elderly frail patient. Did not pursue ischemic testing at the time.  - also new diagnosis of afib with RVR at the time, possibly tachy mediated CM -07/2022 echo LVEF 55-60%   - no SOB/DOE.  - recent LE edema, pcp increased lasix  x 1 week and it resolved. She is back to taking lasix  20mg  daily.     3. Afib - new diagnosis during 03/2022 admission in setting of pneumonia, COPD exacerbation - started on amio during that admission - was back in SR at 05/2022 f/u appt - tremor, some vision changes. There has been some question if amio related, amio has been stopped - tremor improving off amoidarone.  - no bleeding on eliquis .    - 1-2 times per week has palpitations, just a few seconds with activity - no bleeding on eliquis .    4. HTN - compliant with meds   5. HLD 11/2022 TC 208 TG 129 HDL 88 LDL 98 - she is on crestor  20mg  daily.    Past Medical History:  Diagnosis Date   Aortic atherosclerosis (HCC)    Breast cancer (HCC) 2007   Stage I (T1b N0 M0), grade 1 well-differentiated carcinoma of the left breast status, post lumpectomy followed by radiation therapy. Her estrogen receptor receptors were 93%, progesterone receptors 67%. HER-2/neu was negative. No lymphovascular space invasion was seen. All margins were clear. Ki-67 marker was low at 1% with surgery on 11/15/2004. Treated then with post-lumpectomy  radiation, finish   Breast cancer, left breast (HCC) 2007   COPD (chronic obstructive pulmonary disease) (HCC)    Coronary artery calcification seen on CAT scan    Diabetes mellitus without complication (HCC)    diet controlled   Diverticula of colon    Hx of rickettsial disease    Hyperlipidemia    Hypertension    Kidney stones    Nicotine  dependence    Osteoarthritis      Allergies  Allergen Reactions   Penicillins Hives, Shortness Of Breath and Swelling    Has patient had a PCN reaction causing immediate rash, facial/tongue/throat swelling, SOB or lightheadedness with hypotension: yes Has patient had a PCN reaction causing severe rash involving mucus membranes or skin necrosis:no Has patient had a PCN reaction that required hospitalization: yes Has patient had a PCN reaction occurring within the last 10 years: no If all of the above answers are "NO", then may proceed with Cephalosporin use. Tolerates rocephin /keflex    Sulfonamide Derivatives Other (See Comments)    Shaking all over, seizure like symptoms. Hospitalization resulted    Avapro  [Irbesartan ] Other (See Comments)    Pt associates avapro  with bruising though aware scientifically not the case, wants to chanmge med   Levaquin  [Levofloxacin ] Itching     Current Outpatient Medications  Medication Sig Dispense Refill   acetaminophen  (TYLENOL ) 325 MG tablet Take 650 mg by  mouth every 6 (six) hours as needed for moderate pain.      albuterol  (PROVENTIL ) (2.5 MG/3ML) 0.083% nebulizer solution INHALE ONE VIAL VIA NEBULIZER THREE TIMES DAILY. (Patient taking differently: Take 2.5 mg by nebulization 3 (three) times daily as needed for wheezing or shortness of breath.) 300 mL 0   albuterol  (VENTOLIN  HFA) 108 (90 Base) MCG/ACT inhaler INHALE (2) PUFFS EVERY FOURHOURS AS NEEDED ONLY IF YOU CANT CATCH YOUR BREATH (Patient taking differently: Inhale 2 puffs into the lungs every 4 (four) hours as needed for wheezing or shortness of  breath. INHALE (2) PUFFS EVERY FOURHOURS AS NEEDED ONLY IF YOU CANT CATCH YOUR BREATH) 8.5 g 0   amiodarone  (PACERONE ) 200 MG tablet Take 100 mg by mouth daily.     ATROVENT  HFA 17 MCG/ACT inhaler INHALE 2 PUFFS BY MOUTH FOUR TIMES A DAY. 12.9 g 0   bisoprolol  (ZEBETA ) 5 MG tablet Take 1 tablet (5 mg total) by mouth daily. 30 tablet 5   Calcium  Carbonate-Vitamin D  (CALCIUM  600 + D PO) Take 1 tablet by mouth 2 (two) times daily.     Cyanocobalamin  (VITAMIN B 12) 500 MCG TABS Take 500 mcg by mouth daily. Take one tablet by mouth once daily 90 tablet 3   cyclobenzaprine  (FLEXERIL ) 5 MG tablet Take 1 tablet (5 mg total) by mouth at bedtime. 30 tablet 5   ELIQUIS  5 MG TABS tablet TAKE 1 TABLET BY MOUTH TWICE A DAY 60 tablet 5   ENTRESTO  24-26 MG Take 1 tablet by mouth 2 (two) times daily. 60 tablet 5   erythromycin  ophthalmic ointment Place 1 Application into both eyes at bedtime. Apply daily at bedtime to both eyes for 1 week 3.5 g 0   FARXIGA  10 MG TABS tablet Take 1 tablet (10 mg total) by mouth daily. 30 tablet 5   fluticasone  (FLONASE ) 50 MCG/ACT nasal spray PLACE 2 SPRAYS IN EACH NOSTRIL DAILY. 16 g 5   furosemide  (LASIX ) 20 MG tablet Take 1 tablet (20 mg total) by mouth daily. 30 tablet 3   hydrOXYzine  (ATARAX ) 25 MG tablet TAKE ONE TABLET BY MOUTH TWICE DAILY AS NEEDED FOR ITCHING 60 tablet 1   Misc. Devices MISC Please provide patient with mastectomy bra and prosthesis. Dx: History of left breast cancer 1 each 0   Naphazoline HCl (CLEAR EYES OP) Place 1 drop into both eyes daily as needed (irritation).     pantoprazole  (PROTONIX ) 40 MG tablet Take 1 tablet (40 mg total) by mouth every morning. 90 tablet 0   polyethylene glycol powder (GLYCOLAX /MIRALAX ) powder MIX 1 CAPFUL IN 8 OUNCES OF JUICE OR WATER  AND DRINK ONCE DAILY. (Patient taking differently: Take 17 g by mouth daily.) 527 g 5   rosuvastatin  (CRESTOR ) 20 MG tablet Take 1 tablet (20 mg total) by mouth daily. 90 tablet 3    spironolactone  (ALDACTONE ) 25 MG tablet Take 1 tablet (25 mg total) by mouth daily. 90 tablet 1   SYMBICORT  160-4.5 MCG/ACT inhaler Inhale 2 puffs into the lungs 2 (two) times daily. 10.2 g 0   traMADol  (ULTRAM ) 50 MG tablet TAKE (1) TABLET BY MOUTH EVERY 8 HOURS AS NEEDED. 60 tablet 3   No current facility-administered medications for this visit.     Past Surgical History:  Procedure Laterality Date   ABDOMINAL HYSTERECTOMY     BREAST SURGERY  2005 approx   left lumpectomy    CATARACT EXTRACTION W/PHACO Left 05/01/2019   Procedure: CATARACT EXTRACTION PHACO AND INTRAOCULAR LENS PLACEMENT  LEFT EYE CDE=11.73;  Surgeon: Tarri Farm, MD;  Location: [facility];  Service: Ophthalmology;  Laterality: Left;  left   CATARACT EXTRACTION W/PHACO Right 05/15/2019   Procedure: CATARACT EXTRACTION PHACO AND INTRAOCULAR LENS PLACEMENT RIGHT EYE;  Surgeon: Tarri Farm, MD;  Location: [facility];  Service: Ophthalmology;  Laterality: Right;  CDE: 16.07   CHOLECYSTECTOMY N/A 05/26/2014   Procedure: LAPAROSCOPIC CHOLECYSTECTOMY;  Surgeon: Alanda Allegra Md, MD;  Location: [facility];  Service: General;  Laterality: N/A;   COLECTOMY     2005, diverticulitis   COLONOSCOPY N/A 05/20/2017    Surgeon: Alyce Jubilee, MD;  five 3-6 mm polyps (4 tubular adenomas, 1 hyperplastic polyp), tortuous left colon, internal hemorrhoids.  Recommended repeat colonoscopy in 3 years.   COLONOSCOPY WITH PROPOFOL  N/A 07/12/2020   Procedure: COLONOSCOPY WITH PROPOFOL ;  Surgeon: Vinetta Greening, DO;  Location: [facility];  Service: Endoscopy;  Laterality: N/A;  12:30pm   DILATION AND CURETTAGE OF UTERUS     ESOPHAGOGASTRODUODENOSCOPY N/A 05/20/2017   Surgeon: Alyce Jubilee, MD; normal esophagus and mild gastritis due to aspirin .  Biopsies negative for H. pylori.   left breast      cancer, in 2006   POLYPECTOMY  07/12/2020   Procedure: POLYPECTOMY;  Surgeon: Vinetta Greening, DO;  Location: [facility];  Service: Endoscopy;;   TUBAL  LIGATION       Allergies  Allergen Reactions   Penicillins Hives, Shortness Of Breath and Swelling    Has patient had a PCN reaction causing immediate rash, facial/tongue/throat swelling, SOB or lightheadedness with hypotension: yes Has patient had a PCN reaction causing severe rash involving mucus membranes or skin necrosis:no Has patient had a PCN reaction that required hospitalization: yes Has patient had a PCN reaction occurring within the last 10 years: no If all of the above answers are "NO", then may proceed with Cephalosporin use. Tolerates rocephin /keflex    Sulfonamide Derivatives Other (See Comments)    Shaking all over, seizure like symptoms. Hospitalization resulted    Avapro  [Irbesartan ] Other (See Comments)    Pt associates avapro  with bruising though aware scientifically not the case, wants to chanmge med   Levaquin  [Levofloxacin ] Itching      Family History  Problem Relation Age of Onset   Breast cancer Mother    COPD Father    Emphysema Father    Throat cancer Sister    Glaucoma Brother    Schizophrenia Brother    Heart attack Brother    Lung cancer Sister        former smoker   Hepatitis C Daughter    Seizures Daughter    SIDS Son    Colon cancer Neg Hx    Colon polyps Neg Hx      Social History Ms. Chermak reports that she quit smoking about 13 months ago. Her smoking use included cigarettes. She started smoking about 51 years ago. She has a 25 pack-year smoking history. She has never used smokeless tobacco. Ms. Lacko reports no history of alcohol use.     Physical Examination Today's Vitals   06/04/23 1018  BP: 136/74  Pulse: 74  SpO2: 93%  Weight: 190 lb 9.6 oz (86.5 kg)  Height: 5\' 5"  (1.651 m)   Body mass index is 31.72 kg/m.  Gen: resting comfortably, no acute distress HEENT: no scleral icterus, pupils equal round and reactive, no palptable cervical adenopathy,  CV: RRR, no m/rg, no jvd Resp: Clear to auscultation bilaterally GI:  abdomen  is soft, non-tender, non-distended, normal bowel sounds, no hepatosplenomegaly MSK: extremities are warm, no edema.  Skin: warm, no rash Neuro:  no focal deficits Psych: appropriate affect   Diagnostic Studies 11/2017 nuclear stress There was no ST segment deviation noted during stress. This is a low risk study. Very mild apical defect likely due to apical thinning, less likely to be very mild apical ischemia. Either finding supports low risk. The left ventricular ejection fraction is normal (55-65%). The study is normal.       03/2022 echo 1. Left ventricular ejection fraction, by estimation, is 35 to 40%. The  left ventricle has moderately decreased function. The left ventricle  demonstrates regional wall motion abnormalities (see scoring  diagram/findings for description). There is mild  concentric left ventricular hypertrophy. Left ventricular diastolic  parameters are indeterminate.   2. Right ventricular systolic function is normal. The right ventricular  size is normal. Tricuspid regurgitation signal is inadequate for assessing  PA pressure.   3. The mitral valve is grossly normal. Trivial mitral valve  regurgitation.   4. The aortic valve is tricuspid. There is mild calcification of the  aortic valve. Aortic valve regurgitation is not visualized. Aortic valve  sclerosis is present, with no evidence of aortic valve stenosis.   5. The inferior vena cava is normal in size with greater than 50%  respiratory variability, suggesting right atrial pressure of 3 mmHg.      07/2022 echo limited 1. Limited study.   2. Left ventricular ejection fraction, by estimation, is 55 to 60%. The  left ventricle has normal function. The left ventricle demonstrates  regional wall motion abnormalities (see scoring diagram/findings for  description).   3. Right ventricular systolic function is normal. The right ventricular  size is normal.   4. The mitral valve is grossly normal. No  evidence of mitral valve  regurgitation.   5. The aortic valve is tricuspid. There is mild calcification of the  aortic valve. Aortic valve regurgitation is not visualized. Aortic valve  sclerosis is present, with no evidence of aortic valve stenosis.   6. The inferior vena cava is normal in size with greater than 50%  respiratory variability, suggesting right atrial pressure of 3 mmHg.       Assessment and Plan  1. CAD - incidental findings on CT chest. Imaging suggets LM and multivessel disease. She has atypical chest pain at the site of her previous lumpectomy. - stress imaging was benign - denies any recent symptoms, we will continue to monitor   2. Afib/acquired thrombophilia - tremor and vision changes on amio unclear if related to amio, it has been stopped. Ongoing workup by neuro and ophto for ongoing vision issues - doing well on bisoprolol . Tolerating eliquis  for stroke prevention, continue current med   3.HLD - continue statin, follow upcoming pcp labs   4. HTN - bp is at goal, continue current meds  5. HFimpEF - euvolemic today without symptoms - some swelling about 1 month ago that resolved with additional lasix . Will change her lasix  to 20mg  daily may take additional 20mg  prn swelling.       Laurann Pollock, M.D.

## 2023-06-06 ENCOUNTER — Other Ambulatory Visit: Payer: Self-pay | Admitting: Family Medicine

## 2023-06-16 NOTE — Progress Notes (Unsigned)
 [name], female    DOB: [birth year]    MRN: [id number]   Brief patient profile:  12 yowf quit smoking 04/09/22  with new onset doe around 2014 dx as copd  and referred to pulmonary clinic in [facility]  11/19/2019 by Dr    Rodolph Clap for refractory cough and sob with dx of GOLD 3 copd/AB  made 09/26/21    History of Present Illness  11/19/2019  Pulmonary/ 1st [name] eval/ [name] / [name] maint on symbicort  160/atrovent  Chief Complaint  Patient presents with   Consult    sinus congestion for about a 1-2 weeks  Dyspnea:  Goal mb and back 100 ft /  Variably sob shopping / not using [facility] parking  Cough: sporadic  Sleep: fine / some worse cough in am SABA use: not clear  rec Benazapril 40 mg  > stop this and replace with ibesartan 300 mg one daily in its place  Plan A = Automatic = Always=    symbicort  160 Take 2 puffs first thing in am and then another 2 puffs about 12 hours later.  Work on inhaler technique:   Plan B = Backup (to supplement plan A, not to replace it) Only use your albuterol  inhaler as a rescue medication  Plan C = Crisis (instead of Plan B but only if Plan B stops working) - only use your albuterol  nebulizer if you first try Plan B and it fails to help  The key is to stop smoking completely before smoking completely stops you! Please schedule a follow up [name] visit in 6 weeks, call sooner if needed with all medications   07/19/2021  f/u ov/[name]/[name] re: AB maint on symbicort  160 2bid   Chief Complaint  Patient presents with   Follow-up    Doing well. No new concerns    Dyspnea:  MB and back easier / no real steps  Cough: still smoker's cough worse in ams Sleeping: does fine bed is flat with pillows  SABA use: none now  02: none Rec Plan A = Automatic = Always=    Symbicort  160 Take 2 puffs first thing in am and then another 2 puffs about 12 hours later.  Work on inhaler technique:  Plan B = Backup (to supplement plan A, not to replace  it) Only use your albuterol  inhaler as a rescue medication  Plan C = Crisis (instead of Plan B but only if Plan B stops working) - only use your albuterol  nebulizer if you first try Plan B  PFTs prior to next [name] visit  The key is to stop smoking completely before smoking completely stops you! Please schedule a follow up visit in 6 months but call sooner if needed  with all medications /inhalers/ solutions in hand    01/18/2022  f/u ov/[name]/[name] re: GOLD 3/ AB  maint on symb 160 Chief Complaint  Patient presents with   Follow-up    Breathing doing ok   Dyspnea:  no longer doing mb due to L leg pain  Cough: better but still smoker's rattle worse in am/ slt creamy  Sleeping: flat bed with pillows  SABA use: very confused with instructions 02: none  Covid status: up to date  Lung cancer screening: in program  Rec Plan A = Automatic = Always=    Symbicort  160 Take 2 puffs first thing in am and then another 2 puffs about 12 hours later.  Work on inhaler technique:   Please schedule a  follow up visit in 3 months but call sooner if needed      10/11/2022  f/u ov/[name]/[name] re: copd gold 3/chf / 02 dep  maint on symbicort  160    Chief Complaint  Patient presents with   Follow-up    3 month follow up  Dyspnea:  balance issues limit her but with pushing cart does fine at [employer] / on 2lpm but does not check sats  Able to shower s 0 dropping on RA  Cough: none Sleeping: flat bed  2 pillows s    resp cc  SABA use: very rarely  02: 2lpm hs and rarely takes it off but when does says sats ok  Lung cancer screening: in program for 04/08/23  Rec Make sure you check your oxygen  saturation  AT  your highest level of activity (not after you stop)   to be sure it stays over 90%  Ok to try reduce your symbicort  to one puff twice daily and if feel the same can just to one puff each am as long as doing great - back to 2 every 12 hours  Please schedule a follow up visit  in 6  months but call sooner if needed    06/19/2023  f/u ov/[name]/[name] re: copd gold 3/chf / 02 dep maint on symbicort  one twice daily   Chief Complaint  Patient presents with   COPD  Dyspnea:  limited by vision  Cough: thick white mucus  Sleeping: < 30 degrees hosp bed s resp cc  SABA use: none  02: 2 lpm 24/7 x for when goes out in w/c   Lung cancer screening: ordered not done    No obvious day to day or daytime variability or assoc  purulent sputum or mucus plugs or hemoptysis or cp or chest tightness, subjective wheeze or overt sinus or hb symptoms.    Also denies any obvious fluctuation of symptoms with weather or environmental changes or other aggravating or alleviating factors except as outlined above   No unusual exposure hx or h/o childhood pna/ asthma or knowledge of premature birth.  Current Allergies, Complete Past Medical History, Past Surgical History, Family History, and Social History were reviewed in [employer] record.  ROS  The following are not active complaints unless bolded Hoarseness, sore throat, dysphagia, dental problems, itching, sneezing,  nasal congestion or discharge of excess mucus or purulent secretions, ear ache,   fever, chills, sweats, unintended wt loss or wt gain, classically pleuritic or exertional cp,  orthopnea pnd or arm/hand swelling  or leg swelling, presyncope, palpitations, abdominal pain, anorexia, nausea, vomiting, diarrhea  or change in bowel habits or change in bladder habits, change in stools or change in urine, dysuria, hematuria,  rash, arthralgias, visual complaints, headache, numbness, weakness or ataxia or problems with walking or coordination,  change in mood or  memory.        Current Meds  Medication Sig   acetaminophen  (TYLENOL ) 325 MG tablet Take 650 mg by mouth every 6 (six) hours as needed for moderate pain.    albuterol  (PROVENTIL ) (2.5 MG/3ML) 0.083% nebulizer solution INHALE ONE VIAL VIA  NEBULIZER THREE TIMES DAILY. (Patient taking differently: Take 2.5 mg by nebulization 3 (three) times daily as needed for wheezing or shortness of breath.)   albuterol  (VENTOLIN  HFA) 108 (90 Base) MCG/ACT inhaler INHALE (2) PUFFS EVERY FOURHOURS AS NEEDED ONLY IF YOU CANT CATCH YOUR BREATH (Patient taking differently: Inhale 2 puffs into the lungs  every 4 (four) hours as needed for wheezing or shortness of breath. INHALE (2) PUFFS EVERY FOURHOURS AS NEEDED ONLY IF YOU CANT CATCH YOUR BREATH)   ATROVENT  HFA 17 MCG/ACT inhaler INHALE 2 PUFFS BY MOUTH FOUR TIMES A DAY.   bisoprolol  (ZEBETA ) 5 MG tablet Take 1 tablet (5 mg total) by mouth daily.   Calcium  Carbonate-Vitamin D  (CALCIUM  600 + D PO) Take 1 tablet by mouth 2 (two) times daily.   Cyanocobalamin  (VITAMIN B 12) 500 MCG TABS Take 500 mcg by mouth daily. Take one tablet by mouth once daily   cyclobenzaprine  (FLEXERIL ) 5 MG tablet Take 1 tablet (5 mg total) by mouth at bedtime.   ELIQUIS  5 MG TABS tablet TAKE 1 TABLET BY MOUTH TWICE A DAY   ENTRESTO  24-26 MG Take 1 tablet by mouth 2 (two) times daily.   erythromycin  ophthalmic ointment Place 1 Application into both eyes at bedtime. Apply daily at bedtime to both eyes for 1 week   FARXIGA  10 MG TABS tablet Take 1 tablet (10 mg total) by mouth daily.   fluticasone  (FLONASE ) 50 MCG/ACT nasal spray PLACE 2 SPRAYS IN EACH NOSTRIL DAILY.   furosemide  (LASIX ) 20 MG tablet Take 1 tablet by mouth daily. May take an additional tablet as needed for fluid/edema   hydrOXYzine  (ATARAX ) 25 MG tablet TAKE ONE TABLET BY MOUTH TWICE DAILY AS NEEDED FOR ITCHING   Misc. Devices MISC Please provide patient with mastectomy bra and prosthesis. Dx: History of left breast cancer   Naphazoline HCl (CLEAR EYES OP) Place 1 drop into both eyes daily as needed (irritation).   pantoprazole  (PROTONIX ) 40 MG tablet Take 1 tablet (40 mg total) by mouth every morning.   polyethylene glycol powder (GLYCOLAX /MIRALAX ) powder MIX 1  CAPFUL IN 8 OUNCES OF JUICE OR WATER  AND DRINK ONCE DAILY. (Patient taking differently: Take 17 g by mouth daily.)   rosuvastatin  (CRESTOR ) 20 MG tablet Take 1 tablet (20 mg total) by mouth daily.   spironolactone  (ALDACTONE ) 25 MG tablet Take 1 tablet (25 mg total) by mouth daily.   SYMBICORT  160-4.5 MCG/ACT inhaler Inhale 2 puffs into the lungs 2 (two) times daily.   traMADol  (ULTRAM ) 50 MG tablet TAKE (1) TABLET BY MOUTH EVERY 8 HOURS AS NEEDED.                Past Medical History:  Diagnosis Date   Breast cancer (HCC) 2007   Stage I (T1b N0 M0), grade 1 well-differentiated carcinoma of the left breast status, post lumpectomy followed by radiation therapy. Her estrogen receptor receptors were 93%, progesterone receptors 67%. HER-2/neu was negative. No lymphovascular space invasion was seen. All margins were clear. Ki-67 marker was low at 1% with surgery on 11/15/2004. Treated then with post-lumpectomy radiation, finish   Breast cancer, left breast (HCC) 2007   COPD (chronic obstructive pulmonary disease) (HCC)    Diabetes mellitus without complication (HCC)    Diverticula of colon    Hx of rickettsial disease    Hyperlipidemia    Hypertension    Kidney stones    Nicotine  dependence    Osteoarthritis        Objective:    Wts  06/19/2023          187  10/11/2022        179  07/25/2022        160  07/19/2021          169  12/30/2020     170 11/16/2020  174     04/04/2020       174  12/29/19 179 lb (81.2 kg)  12/04/19 177 lb (80.3 kg)  11/19/19 177 lb 3.2 oz (80.4 kg)    Vital signs reviewed  06/19/2023  - Note at rest 02 sats  94% on RA   General appearance:    chronically ill wf resting tremor/ w/c bound     HEENT : Oropharynx  clear   Nasal turbinates nl    NECK :  without  apparent JVD/ palpable Nodes/TM    LUNGS: no acc muscle use,  Mild barrel  contour chest wall with bilateral  Distant bs s audible wheeze and  without cough on insp or exp maneuvers  and mild   Hyperresonant  to  percussion bilaterally     CV:  RRR  no s3 or murmur or increase in P2, and no edema   ABD:  soft and nontender with pos end  insp Hoover's  in the supine position.  No bruits or organomegaly appreciated   MS:  Nl gait/ ext warm without deformities Or obvious joint restrictions  calf tenderness, cyanosis or clubbing     SKIN: warm and dry without lesions    NEURO:  alert, approp, nl sensorium with  no motor or cerebellar deficits apparent.          Assessment

## 2023-06-19 ENCOUNTER — Other Ambulatory Visit: Payer: Self-pay | Admitting: Internal Medicine

## 2023-06-19 ENCOUNTER — Encounter: Payer: Self-pay | Admitting: Internal Medicine

## 2023-06-19 ENCOUNTER — Ambulatory Visit (INDEPENDENT_AMBULATORY_CARE_PROVIDER_SITE_OTHER): Admitting: Internal Medicine

## 2023-06-19 VITALS — BP 134/73 | HR 63 | Ht 65.0 in | Wt 187.0 lb

## 2023-06-19 DIAGNOSIS — R918 Other nonspecific abnormal finding of lung field: Secondary | ICD-10-CM

## 2023-06-19 DIAGNOSIS — Z87891 Personal history of nicotine dependence: Secondary | ICD-10-CM

## 2023-06-19 DIAGNOSIS — J449 Chronic obstructive pulmonary disease, unspecified: Secondary | ICD-10-CM

## 2023-06-19 DIAGNOSIS — J9611 Chronic respiratory failure with hypoxia: Secondary | ICD-10-CM

## 2023-06-19 NOTE — Patient Instructions (Signed)
 For cough/ congestion > mucinex  or mucinex  dm  up to maximum of  1200 mg every 12 hours as needed   No change symbicort  160 one twice daily    I will check on the status of your CT chest for lung cancer screen and order a new one if needed.

## 2023-06-19 NOTE — Assessment & Plan Note (Addendum)
 Referred to [facility] for best fir for portable 02 - 10/11/2022   Walked on 2lpm cont   x  2  lap(s) =  approx 300  ft  @ slow pace  with lowest 02 sats 93% and min sob  Now that more sedentary no need for 02 with activity / can use prn daytime but rec continue 2lpm hs and f/u here yearly, sooner if needed    Each maintenance medication was reviewed in detail including emphasizing most importantly the difference between maintenance and prns and under what circumstances the prns are to be triggered using an action plan format where appropriate.  Total time for H and P, chart review, counseling, reviewing hfa/02/pulse ox  device(s) and generating customized AVS unique to this office visit / same day charting = 26 min

## 2023-06-19 NOTE — Assessment & Plan Note (Signed)
 Referred for LDSCT 06/19/2023 >>>  Discussed in detail all the  indications, usual  risks and alternatives  relative to the benefits with patient who agrees to proceed with w/u as outlined.

## 2023-06-19 NOTE — Assessment & Plan Note (Addendum)
 Active smoker  - PFT's  03/03/2012  FEV1 1.69 (69 % ) ratio 0.72  p 5 % improvement from saba p ? prior to study with DLCO  17.42 (68%) corrects to 4.53 (92%)  for alv volume and FV curve min curvature/ ERV 37%   - try off acei 11/19/2019 and return for medication reconciliation in 6 weeks - 12/30/2020  After extensive coaching inhaler device,  effectiveness =    75% from a baseline of 50%  - 07/19/2021  After extensive coaching inhaler device,  effectiveness =    75% (short Ti)  - PFT's  09/26/21  FEV1 0.97 (44 % ) ratio 0.54  p 5 % improvement from saba p ? prior to study with DLCO  12.37 (62%)   and FV curve not typical for copd/ air trapping by lung volumes   - 10/11/2022  After extensive coaching inhaler device,  effectiveness =    50% hfa   Breathing fine on symbicort  160 one bid and more limited by geriatric overall decline than copd/AB so no change rx/ f/u yearly   For cough/ congestion > mucinex  or mucinex  dm  up to maximum of  1200 mg every 12 hours as needed

## 2023-06-27 ENCOUNTER — Ambulatory Visit: Payer: 59 | Admitting: Neurology

## 2023-07-02 DIAGNOSIS — M5410 Radiculopathy, site unspecified: Secondary | ICD-10-CM | POA: Diagnosis not present

## 2023-07-02 DIAGNOSIS — I5021 Acute systolic (congestive) heart failure: Secondary | ICD-10-CM | POA: Diagnosis not present

## 2023-07-02 DIAGNOSIS — J441 Chronic obstructive pulmonary disease with (acute) exacerbation: Secondary | ICD-10-CM | POA: Diagnosis not present

## 2023-07-02 DIAGNOSIS — J9601 Acute respiratory failure with hypoxia: Secondary | ICD-10-CM | POA: Diagnosis not present

## 2023-07-09 DIAGNOSIS — Z961 Presence of intraocular lens: Secondary | ICD-10-CM | POA: Diagnosis not present

## 2023-07-09 DIAGNOSIS — H47293 Other optic atrophy, bilateral: Secondary | ICD-10-CM | POA: Diagnosis not present

## 2023-07-09 DIAGNOSIS — H26493 Other secondary cataract, bilateral: Secondary | ICD-10-CM | POA: Diagnosis not present

## 2023-07-09 DIAGNOSIS — H04123 Dry eye syndrome of bilateral lacrimal glands: Secondary | ICD-10-CM | POA: Diagnosis not present

## 2023-07-09 DIAGNOSIS — H53133 Sudden visual loss, bilateral: Secondary | ICD-10-CM | POA: Diagnosis not present

## 2023-07-09 DIAGNOSIS — H4710 Unspecified papilledema: Secondary | ICD-10-CM | POA: Diagnosis not present

## 2023-07-15 ENCOUNTER — Other Ambulatory Visit: Payer: Self-pay | Admitting: Family Medicine

## 2023-07-17 ENCOUNTER — Other Ambulatory Visit: Payer: Self-pay | Admitting: Family Medicine

## 2023-07-18 ENCOUNTER — Telehealth: Payer: Self-pay | Admitting: Orthopaedic Surgery

## 2023-07-23 ENCOUNTER — Other Ambulatory Visit: Payer: Self-pay | Admitting: Family Medicine

## 2023-07-23 DIAGNOSIS — J4489 Other specified chronic obstructive pulmonary disease: Secondary | ICD-10-CM

## 2023-07-23 DIAGNOSIS — R918 Other nonspecific abnormal finding of lung field: Secondary | ICD-10-CM

## 2023-07-25 DIAGNOSIS — I5022 Chronic systolic (congestive) heart failure: Secondary | ICD-10-CM | POA: Diagnosis not present

## 2023-07-25 DIAGNOSIS — J449 Chronic obstructive pulmonary disease, unspecified: Secondary | ICD-10-CM | POA: Diagnosis not present

## 2023-07-25 DIAGNOSIS — J9610 Chronic respiratory failure, unspecified whether with hypoxia or hypercapnia: Secondary | ICD-10-CM | POA: Diagnosis not present

## 2023-07-25 DIAGNOSIS — Z515 Encounter for palliative care: Secondary | ICD-10-CM | POA: Diagnosis not present

## 2023-08-01 DIAGNOSIS — I5021 Acute systolic (congestive) heart failure: Secondary | ICD-10-CM | POA: Diagnosis not present

## 2023-08-01 DIAGNOSIS — M5410 Radiculopathy, site unspecified: Secondary | ICD-10-CM | POA: Diagnosis not present

## 2023-08-01 DIAGNOSIS — J441 Chronic obstructive pulmonary disease with (acute) exacerbation: Secondary | ICD-10-CM | POA: Diagnosis not present

## 2023-08-01 DIAGNOSIS — J9601 Acute respiratory failure with hypoxia: Secondary | ICD-10-CM | POA: Diagnosis not present

## 2023-08-05 ENCOUNTER — Other Ambulatory Visit: Payer: Self-pay

## 2023-08-05 ENCOUNTER — Telehealth: Payer: Self-pay

## 2023-08-05 MED ORDER — UNABLE TO FIND: 11 refills | Status: AC

## 2023-08-05 NOTE — Telephone Encounter (Signed)
 Copied from CRM [id number]. Topic: General - Other >> Aug 05, 2023 10:10 AM Turkey B wrote: Reason for CRM: tony from [employer] calledin, says e faxed paperwork over for supplies of wipes, inconinence pads and gloves on 07/11 an also sropped off paperwork on July 14. Please cb with status >> Aug 05, 2023 10:20 AM Turkey B wrote: Disregard other requests, but caller mentioned lifeline, I dont see that order

## 2023-08-05 NOTE — Telephone Encounter (Signed)
Faxing this morning.

## 2023-08-06 ENCOUNTER — Encounter: Payer: Self-pay | Admitting: Family Medicine

## 2023-08-06 ENCOUNTER — Ambulatory Visit (INDEPENDENT_AMBULATORY_CARE_PROVIDER_SITE_OTHER): Admitting: Family Medicine

## 2023-08-06 VITALS — BP 126/69 | HR 66 | Resp 16 | Ht 65.0 in | Wt 188.0 lb

## 2023-08-06 DIAGNOSIS — R7303 Prediabetes: Secondary | ICD-10-CM | POA: Diagnosis not present

## 2023-08-06 DIAGNOSIS — Z122 Encounter for screening for malignant neoplasm of respiratory organs: Secondary | ICD-10-CM

## 2023-08-06 DIAGNOSIS — J449 Chronic obstructive pulmonary disease, unspecified: Secondary | ICD-10-CM | POA: Diagnosis not present

## 2023-08-06 DIAGNOSIS — E559 Vitamin D deficiency, unspecified: Secondary | ICD-10-CM

## 2023-08-06 DIAGNOSIS — E785 Hyperlipidemia, unspecified: Secondary | ICD-10-CM | POA: Diagnosis not present

## 2023-08-06 DIAGNOSIS — I5020 Unspecified systolic (congestive) heart failure: Secondary | ICD-10-CM | POA: Diagnosis not present

## 2023-08-06 DIAGNOSIS — I10 Essential (primary) hypertension: Secondary | ICD-10-CM | POA: Diagnosis not present

## 2023-08-06 DIAGNOSIS — I4891 Unspecified atrial fibrillation: Secondary | ICD-10-CM | POA: Diagnosis not present

## 2023-08-06 DIAGNOSIS — R32 Unspecified urinary incontinence: Secondary | ICD-10-CM

## 2023-08-06 MED ORDER — UNABLE TO FIND
3 refills | Status: AC
Start: 2023-08-06 — End: ?

## 2023-08-06 NOTE — Patient Instructions (Addendum)
 Annual exam early December  Pls schedule bone density test at checkout  Lipid, cmp and EGFR, TSH, Vit D today   Nurse pls order wipes for hygiene purposes through [facility] for pt    Do not move around without someone with you due to poor vision, be careful not to fall  Thanks for choosing [facility], we consider it a privelige to serve you.

## 2023-08-07 ENCOUNTER — Ambulatory Visit: Payer: Self-pay | Admitting: Family Medicine

## 2023-08-07 LAB — VITAMIN D 25 HYDROXY (VIT D DEFICIENCY, FRACTURES): Vit D, 25-Hydroxy: 37.2 ng/mL (ref 30.0–100.0)

## 2023-08-07 LAB — CMP14+EGFR
ALT: 12 IU/L (ref 0–32)
AST: 12 IU/L (ref 0–40)
Albumin: 4.5 g/dL (ref 3.8–4.8)
Alkaline Phosphatase: 89 IU/L (ref 44–121)
BUN/Creatinine Ratio: 14 (ref 12–28)
BUN: 16 mg/dL (ref 8–27)
Bilirubin Total: 0.4 mg/dL (ref 0.0–1.2)
CO2: 24 mmol/L (ref 20–29)
Calcium: 10.2 mg/dL (ref 8.7–10.3)
Chloride: 100 mmol/L (ref 96–106)
Creatinine, Ser: 1.17 mg/dL — ABNORMAL HIGH (ref 0.57–1.00)
Globulin, Total: 2.7 g/dL (ref 1.5–4.5)
Glucose: 105 mg/dL — ABNORMAL HIGH (ref 70–99)
Potassium: 5.4 mmol/L — ABNORMAL HIGH (ref 3.5–5.2)
Sodium: 140 mmol/L (ref 134–144)
Total Protein: 7.2 g/dL (ref 6.0–8.5)
eGFR: 48 mL/min/1.73 — ABNORMAL LOW (ref >59)

## 2023-08-07 LAB — LIPID PANEL
Chol/HDL Ratio: 2.1 ratio (ref 0.0–4.4)
Cholesterol, Total: 183 mg/dL (ref 100–199)
HDL: 87 mg/dL (ref >39)
LDL Chol Calc (NIH): 76 mg/dL (ref 0–99)
Triglycerides: 116 mg/dL (ref 0–149)
VLDL Cholesterol Cal: 20 mg/dL (ref 5–40)

## 2023-08-07 LAB — TSH: TSH: 1.95 u[IU]/mL (ref 0.450–4.500)

## 2023-08-13 ENCOUNTER — Other Ambulatory Visit (HOSPITAL_COMMUNITY)

## 2023-08-13 ENCOUNTER — Ambulatory Visit (HOSPITAL_COMMUNITY)
Admission: RE | Admit: 2023-08-13 | Discharge: 2023-08-13 | Disposition: A | Discharge status: Home / Self Care | Source: Ambulatory Visit | Attending: Family Medicine | Admitting: Family Medicine

## 2023-08-13 DIAGNOSIS — Z780 Asymptomatic menopausal state: Secondary | ICD-10-CM | POA: Diagnosis not present

## 2023-08-13 DIAGNOSIS — Z0000 Encounter for general adult medical examination without abnormal findings: Secondary | ICD-10-CM | POA: Diagnosis not present

## 2023-08-13 DIAGNOSIS — M8589 Other specified disorders of bone density and structure, multiple sites: Secondary | ICD-10-CM | POA: Diagnosis not present

## 2023-08-19 ENCOUNTER — Ambulatory Visit: Payer: Self-pay | Admitting: Family Medicine

## 2023-09-01 DIAGNOSIS — J441 Chronic obstructive pulmonary disease with (acute) exacerbation: Secondary | ICD-10-CM | POA: Diagnosis not present

## 2023-09-01 DIAGNOSIS — I5021 Acute systolic (congestive) heart failure: Secondary | ICD-10-CM | POA: Diagnosis not present

## 2023-09-01 DIAGNOSIS — M5410 Radiculopathy, site unspecified: Secondary | ICD-10-CM | POA: Diagnosis not present

## 2023-09-01 DIAGNOSIS — J9601 Acute respiratory failure with hypoxia: Secondary | ICD-10-CM | POA: Diagnosis not present

## 2023-09-02 ENCOUNTER — Other Ambulatory Visit: Payer: Self-pay | Admitting: Family Medicine

## 2023-09-02 DIAGNOSIS — J4489 Other specified chronic obstructive pulmonary disease: Secondary | ICD-10-CM

## 2023-09-02 DIAGNOSIS — R918 Other nonspecific abnormal finding of lung field: Secondary | ICD-10-CM

## 2023-09-06 ENCOUNTER — Encounter: Payer: Self-pay | Admitting: Radiology

## 2023-09-11 ENCOUNTER — Telehealth: Payer: Self-pay

## 2023-09-11 NOTE — Telephone Encounter (Signed)
 Faxed, pt granddaughter informed

## 2023-09-11 NOTE — Telephone Encounter (Signed)
 Copied from CRM [id number]. Topic: General - Other >> Sep 11, 2023 12:05 PM Santiya F wrote: Reason for CRM: Patient's granddaughter is calling in because [place] is requesting the office send over information regarding medical necessity for patient's wipes. Please follow up with patient's granddaughter.

## 2023-09-13 ENCOUNTER — Other Ambulatory Visit (HOSPITAL_COMMUNITY): Payer: Self-pay | Admitting: Family Medicine

## 2023-09-13 DIAGNOSIS — Z1231 Encounter for screening mammogram for malignant neoplasm of breast: Secondary | ICD-10-CM

## 2023-09-15 ENCOUNTER — Encounter: Payer: Self-pay | Admitting: Family Medicine

## 2023-09-15 DIAGNOSIS — R32 Unspecified urinary incontinence: Secondary | ICD-10-CM | POA: Insufficient documentation

## 2023-09-15 DIAGNOSIS — E559 Vitamin D deficiency, unspecified: Secondary | ICD-10-CM | POA: Insufficient documentation

## 2023-09-15 DIAGNOSIS — Z122 Encounter for screening for malignant neoplasm of respiratory organs: Secondary | ICD-10-CM | POA: Insufficient documentation

## 2023-09-15 NOTE — Assessment & Plan Note (Signed)
 Controlled, no change in medication

## 2023-09-15 NOTE — Progress Notes (Signed)
 [name]     MRN: [id number]      DOB: May 01, 1945  Chief Complaint  Patient presents with   Medical Management of Chronic Issues    4 month follow up     HPI [name] is here for follow up and re-evaluation of chronic medical conditions, medication management and review of any available recent lab and radiology data.  Preventive health is updated, specifically  Cancer screening and Immunization.   Questions or concerns regarding consultations or procedures which the PT has had in the interim are  addressed. The PT denies any adverse reactions to current medications since the last visit.  There are no new concerns.  There are no specific complaints   ROS Denies recent fever or chills. Denies sinus pressure, nasal congestion, ear pain or sore throat. Denies chest congestion, productive cough or wheezing. Denies chest pains, palpitations and leg swelling Denies abdominal pain, nausea, vomiting,diarrhea or constipation.   Denies dysuria, frequency, hesitancy or incontinence. Denies joint pain, swelling and limitation in mobility. Denies headaches, seizures, numbness, or tingling. Denies depression, anxiety or insomnia. Denies skin break down or rash.   PE  BP 126/69   Pulse 66   Resp 16   Ht 5' 5 (1.651 m)   Wt 188 lb (85.3 kg)   SpO2 92%   BMI 31.28 kg/m   Patient alert and oriented and in no cardiopulmonary distress.  HEENT: No facial asymmetry, EOMI,     Neck supple .  Chest: decreased air entry, few scattered wheezes CVS: S1, S2 no murmurs, no S3.Regular rate.  ABD: Soft non tender.   Ext: No edema  MS: decreased  ROM spine, shoulders, hips and knees.  Skin: Intact, no ulcerations or rash noted.  Psych: Good eye contact, normal affect. Memory intact not anxious or depressed appearing.  CNS: CN 2-12 intact, power,  normal throughout.no focal deficits noted.   Assessment & Plan  HTN (hypertension) Controlled, no change in  medication   Hyperlipidemia LDL goal <100 Hyperlipidemia:Low fat diet discussed and encouraged.   Lipid Panel  Lab Results  Component Value Date   CHOL 183 08/06/2023   HDL 87 08/06/2023   LDLCALC 76 08/06/2023   TRIG 116 08/06/2023   CHOLHDL 2.1 08/06/2023     Controlled, no change in medication   Screening for lung cancer Over 20 oack year history, quit less than 15 years ago, refer for screeenking  Prediabetes Patient educated about the importance of limiting  Carbohydrate intake , the need to commit to daily physical activity for a minimum of 30 minutes , and to commit weight loss. The fact that changes in all these areas will reduce or eliminate all together the development of diabetes is stressed.      Latest Ref Rng & Units 08/06/2023   10:54 AM 04/23/2023    2:21 PM 04/07/2023    5:03 AM 02/19/2023    4:25 PM 12/05/2022   10:02 AM  Diabetic Labs  Chol 100 - 199 mg/dL 816     791   HDL >60 mg/dL 87     88   Calc LDL 0 - 99 mg/dL 76     98   Triglycerides 0 - 149 mg/dL 883     870   Creatinine 0.57 - 1.00 mg/dL 8.82  8.88  9.04  8.56  1.23       08/06/2023   10:07 AM 06/19/2023   10:04 AM 06/04/2023   10:18 AM 05/21/2023  8:13 AM 04/23/2023    1:56 PM 04/23/2023    1:47 PM 04/07/2023    7:00 AM  BP/Weight  Systolic BP 126 134 136 116 134 140 134  Diastolic BP 69 73 74 72 76 77 77  Wt. (Lbs) 188 187 190.6 189  189.4   BMI 31.28 kg/m2 31.12 kg/m2 31.72 kg/m2 31.45 kg/m2  31.52 kg/m2       Latest Ref Rng & Units 09/07/2014   12:00 AM 06/10/2013    2:00 PM  Foot/eye exam completion dates  Eye Exam No Retinopathy No Retinopathy       Foot Form Completion   Done     This result is from an external source.      HFrEF (heart failure with reduced ejection fraction) (HCC) Stable and controlled on current regime, managed by Cardiology, low sodium and fluid restriction discussed and encouraged  COPD GOLD 3/ AB Stable currently on rgime  per pulmonary , continue  same  Atrial fibrillation (HCC) Rate controlled and maintained on eliquis 

## 2023-09-15 NOTE — Assessment & Plan Note (Signed)
 Over 20 oack year history, quit less than 15 years ago, refer for screeenking

## 2023-09-15 NOTE — Assessment & Plan Note (Signed)
Rate controlled and maintained on eliquis 

## 2023-09-15 NOTE — Assessment & Plan Note (Signed)
 Stable and controlled on current regime, managed by Cardiology, low sodium and fluid restriction discussed and encouraged

## 2023-09-15 NOTE — Assessment & Plan Note (Signed)
 Hyperlipidemia:Low fat diet discussed and encouraged.   Lipid Panel  Lab Results  Component Value Date   CHOL 183 08/06/2023   HDL 87 08/06/2023   LDLCALC 76 08/06/2023   TRIG 116 08/06/2023   CHOLHDL 2.1 08/06/2023     Controlled, no change in medication

## 2023-09-15 NOTE — Assessment & Plan Note (Signed)
 Stable currently on rgime  per pulmonary , continue same

## 2023-09-15 NOTE — Assessment & Plan Note (Signed)
 Patient educated about the importance of limiting  Carbohydrate intake , the need to commit to daily physical activity for a minimum of 30 minutes , and to commit weight loss. The fact that changes in all these areas will reduce or eliminate all together the development of diabetes is stressed.      Latest Ref Rng & Units 08/06/2023   10:54 AM 04/23/2023    2:21 PM 04/07/2023    5:03 AM 02/19/2023    4:25 PM 12/05/2022   10:02 AM  Diabetic Labs  Chol 100 - 199 mg/dL 816     791   HDL >60 mg/dL 87     88   Calc LDL 0 - 99 mg/dL 76     98   Triglycerides 0 - 149 mg/dL 883     870   Creatinine 0.57 - 1.00 mg/dL 8.82  8.88  9.04  8.56  1.23       08/06/2023   10:07 AM 06/19/2023   10:04 AM 06/04/2023   10:18 AM 05/21/2023    8:13 AM 04/23/2023    1:56 PM 04/23/2023    1:47 PM 04/07/2023    7:00 AM  BP/Weight  Systolic BP 126 134 136 116 134 140 134  Diastolic BP 69 73 74 72 76 77 77  Wt. (Lbs) 188 187 190.6 189  189.4   BMI 31.28 kg/m2 31.12 kg/m2 31.72 kg/m2 31.45 kg/m2  31.52 kg/m2       Latest Ref Rng & Units 09/07/2014   12:00 AM 06/10/2013    2:00 PM  Foot/eye exam completion dates  Eye Exam No Retinopathy No Retinopathy       Foot Form Completion   Done     This result is from an external source.

## 2023-09-17 ENCOUNTER — Other Ambulatory Visit: Payer: Self-pay

## 2023-09-17 DIAGNOSIS — J4489 Other specified chronic obstructive pulmonary disease: Secondary | ICD-10-CM

## 2023-09-17 DIAGNOSIS — R918 Other nonspecific abnormal finding of lung field: Secondary | ICD-10-CM

## 2023-09-17 MED ORDER — ATROVENT HFA 17 MCG/ACT IN AERS: INHALATION_SPRAY | RESPIRATORY_TRACT | 3 refills | Status: DC

## 2023-09-22 ENCOUNTER — Other Ambulatory Visit: Payer: Self-pay | Admitting: Family Medicine

## 2023-09-24 ENCOUNTER — Encounter: Payer: Self-pay | Admitting: Internal Medicine

## 2023-09-24 ENCOUNTER — Ambulatory Visit (INDEPENDENT_AMBULATORY_CARE_PROVIDER_SITE_OTHER): Admitting: Internal Medicine

## 2023-09-24 VITALS — BP 122/66 | HR 94 | Ht 63.0 in | Wt 187.0 lb

## 2023-09-24 DIAGNOSIS — K219 Gastro-esophageal reflux disease without esophagitis: Secondary | ICD-10-CM | POA: Diagnosis not present

## 2023-09-24 DIAGNOSIS — J029 Acute pharyngitis, unspecified: Secondary | ICD-10-CM

## 2023-09-24 DIAGNOSIS — I5020 Unspecified systolic (congestive) heart failure: Secondary | ICD-10-CM

## 2023-09-24 NOTE — Patient Instructions (Addendum)
 Please perform salt water  gargling for sore throat.  Please use jello or cough drops for soothing effect in throat.  Please contact us  if you have persistent pain after 3 days or recurrent chocking spells.

## 2023-09-24 NOTE — Assessment & Plan Note (Addendum)
 Has chronic leg swelling Continue Lasix  20 mg once daily and spironolactone  25 mg once daily Continue Farxiga  10 mg QD and Entresto  24-26 mg twice daily Follow up with cardiology

## 2023-09-24 NOTE — Assessment & Plan Note (Signed)
 Continue pantoprazole  40 mg QD If recurrent episodes of dysphagia, will obtain x-ray esophagram

## 2023-09-24 NOTE — Progress Notes (Signed)
 Acute Office Visit  Subjective:    Patient ID: [name], female    DOB: 06/24/1945, 78 y.o.   MRN: [id number]  Chief Complaint  Patient presents with   Sore Throat    States she choked on one of her medications on Saturday, sore throat started Sunday.     HPI Patient is in today for complaint of sore throat for the last 2 days.  She had difficulty swallowing a tablet on 09/21/23 and later had similar episode with melatonin gummy.  She has been complaining of throat discomfort since then.  Denies any nasal congestion, postnasal drip, fever or chills currently.  She denies any previous episode of dysphagia.  She has been able to tolerate solid food intake as well.  Past Medical History:  Diagnosis Date   Aortic atherosclerosis (HCC)    Breast cancer (HCC) 2007   Stage I (T1b N0 M0), grade 1 well-differentiated carcinoma of the left breast status, post lumpectomy followed by radiation therapy. Her estrogen receptor receptors were 93%, progesterone receptors 67%. HER-2/neu was negative. No lymphovascular space invasion was seen. All margins were clear. Ki-67 marker was low at 1% with surgery on 11/15/2004. Treated then with post-lumpectomy radiation, finish   Breast cancer, left breast (HCC) 2007   COPD (chronic obstructive pulmonary disease) (HCC)    Coronary artery calcification seen on CAT scan    Diabetes mellitus without complication (HCC)    diet controlled   Diverticula of colon    Hx of rickettsial disease    Hyperlipidemia    Hypertension    Kidney stones    Nicotine dependence    Osteoarthritis     Past Surgical History:  Procedure Laterality Date   ABDOMINAL HYSTERECTOMY     BREAST SURGERY  2005 approx   left lumpectomy    CATARACT EXTRACTION W/PHACO Left 05/01/2019   Procedure: CATARACT EXTRACTION PHACO AND INTRAOCULAR LENS PLACEMENT LEFT EYE CDE=11.73;  Surgeon: Wrzosek, James, MD;  Location: [facility];  Service: Ophthalmology;  Laterality: Left;  left    CATARACT EXTRACTION W/PHACO Right 05/15/2019   Procedure: CATARACT EXTRACTION PHACO AND INTRAOCULAR LENS PLACEMENT RIGHT EYE;  Surgeon: Wrzosek, James, MD;  Location: [facility];  Service: Ophthalmology;  Laterality: Right;  CDE: 16.07   CHOLECYSTECTOMY N/A 05/26/2014   Procedure: LAPAROSCOPIC CHOLECYSTECTOMY;  Surgeon: Mark Jenkins Md, MD;  Location: [facility];  Service: General;  Laterality: N/A;   COLECTOMY     20 05, diverticulitis   COLONOSCOPY N/A 05/20/2017    Surgeon: Harvey Margo CROME, MD;  five 3-6 mm polyps (4 tubular adenomas, 1 hyperplastic polyp), tortuous left colon, internal hemorrhoids.  Recommended repeat colonoscopy in 3 years.   COLONOSCOPY WITH PROPOFOL  N/A 07/12/2020   Procedure: COLONOSCOPY WITH PROPOFOL ;  Surgeon: Cindie Carlin POUR, DO;  Location: [facility];  Service: Endoscopy;  Laterality: N/A;  12:30pm   DILATION AND CURETTAGE OF UTERUS     ESOPHAGOGASTRODUODENOSCOPY N/A 05/20/2017   Surgeon: Harvey Margo CROME, MD; normal esophagus and mild gastritis due to aspirin .  Biopsies negative for H. pylori.   left breast      cancer, in 2006   POLYPECTOMY  07/12/2020   Procedure: POLYPECTOMY;  Surgeon: Cindie Carlin POUR, DO;  Location: [facility];  Service: Endoscopy;;   TUBAL LIGATION      Family History  Problem Relation Age of Onset   Breast cancer Mother    COPD Father    Emphysema Father    Throat cancer Sister  Glaucoma Brother    Schizophrenia Brother    Heart attack Brother    Lung cancer Sister        former smoker   Hepatitis C Daughter    Seizures Daughter    SIDS Son    Colon cancer Neg Hx    Colon polyps Neg Hx     Social History   Socioeconomic History   Marital status: Single    Spouse name: Not on file   Number of children: 5   Years of education: 9th grade    Highest education level: 9th grade  Occupational History   Occupation: retired    Associate Professor: RETIRED  Tobacco Use   Smoking status: Former    Current packs/day: 0.00    Average  packs/day: 0.5 packs/day for 50.0 years (25.0 ttl pk-yrs)    Types: Cigarettes    Start date: 04/08/1972    Quit date: 04/09/2022    Years since quitting: 1.4   Smokeless tobacco: Never   Tobacco comments:       Vaping Use   Vaping status: Never Used  Substance and Sexual Activity   Alcohol use: No    Alcohol/week: 0.0 standard drinks of alcohol   Drug use: No   Sexual activity: Not Currently  Other Topics Concern   Not on file  Social History Narrative   ** Merged History Encounter ** 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 QUIT WORKING 2003 AFTER CANCER DIAGNOSIS.      MARRIED WITH A RUN AWAY HUSBAND(FUGITIVE 10 YRS). 3 MISCARRIAGES, 5 CHILDREN   Right handed    Social Drivers of Health   Financial Resource Strain: Low Risk  (02/26/2023)   Overall Financial Resource Strain (CARDIA)    Difficulty of Paying Living Expenses: Not hard at all  Food Insecurity: No Food Insecurity (02/26/2023)   Hunger Vital Sign    Worried About Running Out of Food in the Last Year: Never true    Ran Out of Food in the Last Year: Never true  Transportation Needs: No Transportation Needs (02/26/2023)   PRAPARE - Administrator, Civil Service (Medical): No    Lack of Transportation (Non-Medical): No  Physical Activity: Patient Declined (02/26/2023)   Exercise Vital Sign    Days of Exercise per Week: Patient declined    Minutes of Exercise per Session: Patient declined  Stress: No Stress Concern Present (02/26/2023)   [employer] of Occupational Health - Occupational Stress Questionnaire    Feeling of Stress : Not at all  Social Connections: Socially Isolated (02/26/2023)   Social Connection and Isolation Panel    Frequency of Communication with Friends and Family: More than three times a week    Frequency of Social Gatherings with Friends and Family: More than three times a week    Attends Religious Services: Never    Database administrator or Organizations:  No    Attends Banker Meetings: Never    Marital Status: Divorced  Catering manager Violence: Not At Risk (02/26/2023)   Humiliation, Afraid, Rape, and Kick questionnaire    Fear of Current or Ex-Partner: No    Emotionally Abused: No    Physically Abused: No    Sexually Abused: No    Outpatient Medications Prior to Visit  Medication Sig Dispense Refill   acetaminophen  (TYLENOL ) 325 MG tablet Take 650 mg by mouth every 6 (six) hours as needed for moderate pain.      albuterol  (PROVENTIL ) (2.5 MG/3ML)  0.083% nebulizer solution INHALE ONE VIAL VIA NEBULIZER THREE TIMES DAILY. (Patient taking differently: Take 2.5 mg by nebulization 3 (three) times daily as needed for wheezing or shortness of breath.) 300 mL 0   albuterol  (VENTOLIN  HFA) 108 (90 Base) MCG/ACT inhaler INHALE (2) PUFFS EVERY FOURHOURS AS NEEDED ONLY IF YOU CANT CATCH YOUR BREATH (Patient taking differently: Inhale 2 puffs into the lungs every 4 (four) hours as needed for wheezing or shortness of breath. INHALE (2) PUFFS EVERY FOURHOURS AS NEEDED ONLY IF YOU CANT CATCH YOUR BREATH) 8.5 g 0   bisoprolol  (ZEBETA ) 5 MG tablet Take 1 tablet (5 mg total) by mouth daily. 30 tablet 5   Calcium  Carbonate-Vitamin D  (CALCIUM  600 + D PO) Take 1 tablet by mouth 2 (two) times daily.     Cyanocobalamin  (VITAMIN B 12) 500 MCG TABS Take 500 mcg by mouth daily. Take one tablet by mouth once daily 90 tablet 3   cyclobenzaprine  (FLEXERIL ) 5 MG tablet Take 1 tablet (5 mg total) by mouth at bedtime. 30 tablet 5   ELIQUIS  5 MG TABS tablet TAKE 1 TABLET BY MOUTH TWICE A DAY 60 tablet 5   ENTRESTO  24-26 MG TAKE ONE TABLET BY MOUTH TWICE DAILY 60 tablet 5   erythromycin  ophthalmic ointment Place 1 Application into both eyes at bedtime. Apply daily at bedtime to both eyes for 1 week 3.5 g 0   FARXIGA  10 MG TABS tablet Take 1 tablet (10 mg total) by mouth daily. 30 tablet 5   fluticasone  (FLONASE ) 50 MCG/ACT nasal spray PLACE 2 SPRAYS IN EACH  NOSTRIL DAILY. 16 g 5   furosemide  (LASIX ) 20 MG tablet Take 1 tablet by mouth daily. May take an additional tablet as needed for fluid/edema 45 tablet 3   hydrOXYzine  (ATARAX ) 25 MG tablet TAKE ONE TABLET BY MOUTH TWICE DAILY AS NEEDED FOR ITCHING 60 tablet 1   ipratropium (ATROVENT  HFA) 17 MCG/ACT inhaler INHALE 2 PUFFS BY MOUTH FOUR TIMES A DAY. 12.9 g 3   Misc. Devices MISC Please provide patient with mastectomy bra and prosthesis. Dx: History of left breast cancer 1 each 0   Naphazoline HCl (CLEAR EYES OP) Place 1 drop into both eyes daily as needed (irritation).     pantoprazole  (PROTONIX ) 40 MG tablet Take 1 tablet (40 mg total) by mouth every morning. 90 tablet 2   polyethylene glycol powder (GLYCOLAX /MIRALAX ) powder MIX 1 CAPFUL IN 8 OUNCES OF JUICE OR WATER  AND DRINK ONCE DAILY. (Patient taking differently: Take 17 g by mouth daily.) 527 g 5   rosuvastatin  (CRESTOR ) 20 MG tablet Take 1 tablet (20 mg total) by mouth daily. 90 tablet 3   spironolactone  (ALDACTONE ) 25 MG tablet Take 1 tablet (25 mg total) by mouth daily. 90 tablet 1   SYMBICORT  160-4.5 MCG/ACT inhaler INHALE TWO PUFFS INTO THE LUNGS TWICE DAILY 10.2 g 0   traMADol  (ULTRAM ) 50 MG tablet TAKE (1) TABLET BY MOUTH EVERY 8 HOURS AS NEEDED. 60 tablet 3   UNABLE TO FIND Med Name: Incontinence pads - 1 bag monthly 1 Bag 11   UNABLE TO FIND Med Name: Large Chux - 60 monthly 60 each 11   UNABLE TO FIND Med Name: Wipes - 2 packs monthly 2 Box 11   UNABLE TO FIND Med Name: Large Gloves - 1 box monthly 1 Box 11   UNABLE TO FIND Med Name: Wipes 3 Package 3   No facility-administered medications prior to visit.    Allergies  Allergen  Reactions   Penicillins Hives, Shortness Of Breath and Swelling    Has patient had a PCN reaction causing immediate rash, facial/tongue/throat swelling, SOB or lightheadedness with hypotension: yes Has patient had a PCN reaction causing severe rash involving mucus membranes or skin necrosis:no Has  patient had a PCN reaction that required hospitalization: yes Has patient had a PCN reaction occurring within the last 10 years: no If all of the above answers are NO, then may proceed with Cephalosporin use. Tolerates rocephin /keflex    Sulfonamide Derivatives Other (See Comments)    Shaking all over, seizure like symptoms. Hospitalization resulted    Avapro  [Irbesartan ] Other (See Comments)    Pt associates avapro  with bruising though aware scientifically not the case, wants to chanmge med   Levaquin  [Levofloxacin ] Itching    Review of Systems  Constitutional:  Positive for fatigue. Negative for chills and fever.  HENT:  Positive for sore throat. Negative for congestion.   Eyes:  Negative for pain and discharge.  Respiratory:  Negative for cough and shortness of breath.   Cardiovascular:  Positive for leg swelling. Negative for chest pain and palpitations.  Gastrointestinal:  Negative for abdominal pain, diarrhea, nausea and vomiting.  Endocrine: Negative for polydipsia and polyuria.  Genitourinary:  Negative for dysuria and hematuria.  Musculoskeletal:  Negative for neck pain and neck stiffness.  Skin:  Negative for rash.  Neurological:  Negative for dizziness and weakness.  Psychiatric/Behavioral:  Negative for agitation and behavioral problems.        Objective:    Physical Exam Vitals reviewed.  Constitutional:      General: She is not in acute distress.    Appearance: She is obese. She is not diaphoretic.     Comments: In wheelchair  HENT:     Head: Normocephalic and atraumatic.     Nose: Nose normal.     Mouth/Throat:     Mouth: Mucous membranes are moist.     Pharynx: No posterior oropharyngeal erythema.  Eyes:     General: No scleral icterus.    Extraocular Movements: Extraocular movements intact.  Cardiovascular:     Rate and Rhythm: Normal rate and regular rhythm.     Heart sounds: Normal heart sounds. No murmur heard. Pulmonary:     Breath sounds: Normal  breath sounds. No wheezing or rales.  Musculoskeletal:     Right lower leg: Edema (1+) present.     Left lower leg: Edema (2+) present.  Skin:    General: Skin is warm.     Findings: No rash.  Neurological:     General: No focal deficit present.     Mental Status: She is alert and oriented to person, place, and time.  Psychiatric:        Mood and Affect: Mood normal.        Behavior: Behavior normal.     BP 122/66   Pulse 94   Ht 5' 3 (1.6 m)   Wt 187 lb (84.8 kg)   SpO2 93%   BMI 33.13 kg/m  Wt Readings from Last 3 Encounters:  09/24/23 187 lb (84.8 kg)  08/06/23 188 lb (85.3 kg)  06/19/23 187 lb (84.8 kg)        Assessment & Plan:   Problem List Items Addressed This Visit       Cardiovascular and Mediastinum   HFrEF (heart failure with reduced ejection fraction) (HCC)   Has chronic leg swelling Continue Lasix  20 mg once daily and spironolactone  25 mg once  daily Continue Farxiga  10 mg QD and Entresto  24-26 mg twice daily Follow up with cardiology        Digestive   Gastroesophageal reflux disease   Continue pantoprazole  40 mg QD If recurrent episodes of dysphagia, will obtain x-ray esophagram        Other   Sore throat - Primary   Had an episode of dysphagia about 3 days ago, followed by throat discomfort Likely has slight throat erosion Continue pantoprazole  40 mg QD Advised to perform salt water  gargling and use Jello, follow soft diet for 2 days and then advance as tolerated        No orders of the defined types were placed in this encounter.    Suzzane MARLA Blanch, MD

## 2023-09-24 NOTE — Assessment & Plan Note (Signed)
 Had an episode of dysphagia about 3 days ago, followed by throat discomfort Likely has slight throat erosion Continue pantoprazole  40 mg QD Advised to perform salt water  gargling and use Jello, follow soft diet for 2 days and then advance as tolerated

## 2023-09-25 DIAGNOSIS — J9610 Chronic respiratory failure, unspecified whether with hypoxia or hypercapnia: Secondary | ICD-10-CM | POA: Diagnosis not present

## 2023-09-25 DIAGNOSIS — Z515 Encounter for palliative care: Secondary | ICD-10-CM | POA: Diagnosis not present

## 2023-09-25 DIAGNOSIS — I5022 Chronic systolic (congestive) heart failure: Secondary | ICD-10-CM | POA: Diagnosis not present

## 2023-09-25 DIAGNOSIS — J449 Chronic obstructive pulmonary disease, unspecified: Secondary | ICD-10-CM | POA: Diagnosis not present

## 2023-09-27 ENCOUNTER — Other Ambulatory Visit: Payer: Self-pay | Admitting: Family Medicine

## 2023-09-27 DIAGNOSIS — R918 Other nonspecific abnormal finding of lung field: Secondary | ICD-10-CM

## 2023-09-27 DIAGNOSIS — J4489 Other specified chronic obstructive pulmonary disease: Secondary | ICD-10-CM

## 2023-10-01 ENCOUNTER — Inpatient Hospital Stay: Payer: 59 | Attending: Physician Assistant

## 2023-10-01 DIAGNOSIS — M8580 Other specified disorders of bone density and structure, unspecified site: Secondary | ICD-10-CM | POA: Insufficient documentation

## 2023-10-01 DIAGNOSIS — E559 Vitamin D deficiency, unspecified: Secondary | ICD-10-CM

## 2023-10-01 DIAGNOSIS — Z170 Estrogen receptor positive status [ER+]: Secondary | ICD-10-CM | POA: Diagnosis not present

## 2023-10-01 DIAGNOSIS — Z87891 Personal history of nicotine dependence: Secondary | ICD-10-CM | POA: Diagnosis not present

## 2023-10-01 DIAGNOSIS — C50912 Malignant neoplasm of unspecified site of left female breast: Secondary | ICD-10-CM | POA: Insufficient documentation

## 2023-10-01 DIAGNOSIS — Z923 Personal history of irradiation: Secondary | ICD-10-CM | POA: Insufficient documentation

## 2023-10-01 DIAGNOSIS — E611 Iron deficiency: Secondary | ICD-10-CM | POA: Insufficient documentation

## 2023-10-01 DIAGNOSIS — C50212 Malignant neoplasm of upper-inner quadrant of left female breast: Secondary | ICD-10-CM

## 2023-10-01 DIAGNOSIS — Z803 Family history of malignant neoplasm of breast: Secondary | ICD-10-CM | POA: Diagnosis not present

## 2023-10-01 DIAGNOSIS — Z79811 Long term (current) use of aromatase inhibitors: Secondary | ICD-10-CM | POA: Insufficient documentation

## 2023-10-01 DIAGNOSIS — D72829 Elevated white blood cell count, unspecified: Secondary | ICD-10-CM | POA: Diagnosis not present

## 2023-10-01 LAB — COMPREHENSIVE METABOLIC PANEL WITH GFR
ALT: 12 U/L (ref 0–44)
AST: 15 U/L (ref 15–41)
Albumin: 3.7 g/dL (ref 3.5–5.0)
Alkaline Phosphatase: 72 U/L (ref 38–126)
Anion gap: 10 (ref 5–15)
BUN: 14 mg/dL (ref 8–23)
CO2: 26 mmol/L (ref 22–32)
Calcium: 9.4 mg/dL (ref 8.9–10.3)
Chloride: 102 mmol/L (ref 98–111)
Creatinine, Ser: 1.17 mg/dL — ABNORMAL HIGH (ref 0.44–1.00)
GFR, Estimated: 48 mL/min — ABNORMAL LOW (ref >60)
Glucose, Bld: 112 mg/dL — ABNORMAL HIGH (ref 70–99)
Potassium: 4.4 mmol/L (ref 3.5–5.1)
Sodium: 138 mmol/L (ref 135–145)
Total Bilirubin: 0.7 mg/dL (ref 0.0–1.2)
Total Protein: 7.1 g/dL (ref 6.5–8.1)

## 2023-10-01 LAB — CBC WITH DIFFERENTIAL/PLATELET
Abs Immature Granulocytes: 0.02 K/uL (ref 0.00–0.07)
Basophils Absolute: 0.1 K/uL (ref 0.0–0.1)
Basophils Relative: 1 %
Eosinophils Absolute: 0.3 K/uL (ref 0.0–0.5)
Eosinophils Relative: 4 %
HCT: 42.5 % (ref 36.0–46.0)
Hemoglobin: 13.2 g/dL (ref 12.0–15.0)
Immature Granulocytes: 0 %
Lymphocytes Relative: 30 %
Lymphs Abs: 2.7 K/uL (ref 0.7–4.0)
MCH: 29.1 pg (ref 26.0–34.0)
MCHC: 31.1 g/dL (ref 30.0–36.0)
MCV: 93.8 fL (ref 80.0–100.0)
Monocytes Absolute: 0.9 K/uL (ref 0.1–1.0)
Monocytes Relative: 10 %
Neutro Abs: 5 K/uL (ref 1.7–7.7)
Neutrophils Relative %: 55 %
Platelets: 415 K/uL — ABNORMAL HIGH (ref 150–400)
RBC: 4.53 MIL/uL (ref 3.87–5.11)
RDW: 14.5 % (ref 11.5–15.5)
WBC: 8.9 K/uL (ref 4.0–10.5)
nRBC: 0 % (ref 0.0–0.2)

## 2023-10-01 LAB — FERRITIN: Ferritin: 31 ng/mL (ref 11–307)

## 2023-10-01 LAB — IRON AND TIBC
Iron: 61 ug/dL (ref 28–170)
Saturation Ratios: 17 % (ref 10.4–31.8)
TIBC: 358 ug/dL (ref 250–450)
UIBC: 297 ug/dL

## 2023-10-01 LAB — VITAMIN D 25 HYDROXY (VIT D DEFICIENCY, FRACTURES): Vit D, 25-Hydroxy: 43.18 ng/mL (ref 30–100)

## 2023-10-02 DIAGNOSIS — M5410 Radiculopathy, site unspecified: Secondary | ICD-10-CM | POA: Diagnosis not present

## 2023-10-02 DIAGNOSIS — J9601 Acute respiratory failure with hypoxia: Secondary | ICD-10-CM | POA: Diagnosis not present

## 2023-10-02 DIAGNOSIS — I5021 Acute systolic (congestive) heart failure: Secondary | ICD-10-CM | POA: Diagnosis not present

## 2023-10-07 NOTE — Progress Notes (Deleted)
 [facility] [address], [place] [address]   CLINIC:  Medical Oncology/Hematology  PCP:  Antonetta Rollene BRAVO, MD [address], [address] / [place] [address] [phone]   REASON FOR VISIT:  Follow-up for left breast cancer, leukocytosis and osteopenia   PRIOR THERAPY: Antiestrogen therapy completed.   NGS Results: Not applicable.   CURRENT THERAPY: Surveillance  BRIEF ONCOLOGIC HISTORY:   Oncology History  Breast cancer (HCC)  11/15/2004 Surgery   Lumpectomy    - 02/22/2005 Radiation Therapy     12/11/2004 -  Chemotherapy   Aromasin    Adverse Reaction   Intolerance to Aromasin    - 01/14/2010 Chemotherapy   Femara     CANCER STAGING:  Cancer Staging  Breast cancer (HCC) Staging form: Breast, AJCC 7th Edition - Clinical: Stage IA (T1b, N0, cM0) - Signed by Tanya Debby RAMAN, PA on 03/11/2012   INTERVAL HISTORY:   [name], a 78 y.o. female, returns for routine follow-up of her history of breast cancer as well as osteopenia and leukocytosis.  Elene was last seen on 10/08/2022 by Pleasant Barefoot PA-C.  At today's visit, she  reports feeling ***.  She denies any recent hospitalizations, surgeries, or changes in her  baseline health status.  She denies any new breast lumps or axillary lymphadenopathy.  *** ***No new onset bone pain, chest pain, dyspnea, or abdominal pain.   ***She has no new headaches, seizures, or focal neurologic deficits.   ***No B symptoms such as fever, chills, night sweats, unintentional weight loss.  She quit smoking in April 2024. *** She denies any recent infections or antibiotics.***  She does take daily steroid containing inhaler (Symbicort ). *** She denies any rectal bleeding or melena apart from scant drops of blood when straining for bowel movement.  *** She takes iron tablet daily.  She reports 80***% energy and 100***% appetite.   ***She is maintaining stable weight at this  time.  ASSESSMENT & PLAN:  1.  Stage I (T1 N0 M0) left breast IDC: - Diagnosis in 2006, status post lumpectomy and SLNB. - Pathology: 1 cm low-grade IDC, 0/1 lymph node positive, estrogen positive, progesterone positive, Ki-67 1%, HER2 1+. - Completed adjuvant radiation therapy around 02/22/2005. - Aromasin started on 11/2004, switched to Femara due to intolerance.  Completed 5 years of Femara on 01/14/2010. - Reviewed mammogram from 10/15/2022, BI-RADS Category 2, benign.   - Physical exam today (***) showed left breast lumpectomy scar within normal limits.  No discrete nodules, masses, or lymphadenopathy in bilateral breast. - Labs (10/01/2023) show baseline CBC/D, normal LFTs, baseline creatinine.   - No red flag symptoms per history/ROS.  ***She does report some occasional nipple drainage from left nipple that has been ongoing ever since lumpectomy surgery. - PLAN: She is due for annual screening mammogram, which has been ordered by her PCP and scheduled for 10/18/2023 - RTC in 1 year for annual follow-up of breast cancer *** discharge to PCP ***  2.  Leukocytosis and thrombocytosis: - She has intermittent leukocytosis and thrombocytosis since 2008. - No B symptoms or frequent infections. - She is a former smoker (quit April 2024). - She takes a daily steroid containing inhaler (Symbicort )*** - BCR/ABL FISH negative. - Most recent labs (10/01/2023): Normal WBC/differential.  Mildly elevated platelets 415. - PLAN: Most likely reactive.  We will check JAK2 with reflex today to rule out MPN.  *** If no mutations identified, recommend discharge to PCP  for surveillance.  ***  3.  Iron deficiency without anemia - Labs from 08/16/2022 showed ferritin 23, iron saturation 8% - Colonoscopy (07/12/2020): Nonbleeding internal hemorrhoids, diverticulosis, polyp x 1 - Endoscopy (05/20/2017): Normal esophagus, mild gastritis. - Occasional scant rectal bleeding when straining for bowel movement.  No gross  hematochezia or melena.  Denies any fatigue or pica.*** - Taking iron tablet daily since September 2024.  *** - Most recent labs (10/01/2023): Normal hemoglobin 13.2.  Ferritin 31, iron saturation 17%. - PLAN: *** Continue iron pill unless severely symptomatic (then consider IV iron?)?  *** - *** PCP follow-up?  ***   4.  Osteopenia: - Vitamin D  level was 43.18 in September 2025. - Last denosumab  was on 12/02/2020. - Last bone density scan 04/18/2020 showed T-score -2.0, osteopenic - PLAN: Since she is no longer on breast cancer treatment, we will defer ongoing management of osteopenia to her PCP.  5.   Smoking history: - Quit smoking in April 2024 - Lung cancer screening scan on 03/23/2022 was lung RADS 2, benign appearance/behavior. - Patient canceled LDCT chest that was scheduled for March 2025.  She did not reschedule. - PLAN: *** Reschedule missed LDCT chest?  ***   6.  Family/social history: - Continues to smoke 5-10 cigarettes/day.  Started smoking at age 74. - Mother had breast cancer.  PLAN SUMMARY: *** TBD *** possible discharge to PCP, versus annual follow-up?  *** >> Overdue for annual LDCT chest (patient canceled CT that was scheduled in March 2025) >> Labs in 1 year = CBC/D, CMP, vitamin D , ferritin, iron/TIBC >> OFFICE visit in 1 year     REVIEW OF SYSTEMS: ***  Review of Systems  Constitutional:  Negative for appetite change, chills, diaphoresis, fatigue, fever and unexpected weight change.  HENT:   Negative for lump/mass and nosebleeds.   Eyes:  Negative for eye problems.  Respiratory:  Positive for cough. Negative for hemoptysis and shortness of breath.   Cardiovascular:  Negative for chest pain, leg swelling and palpitations.  Gastrointestinal:  Negative for abdominal pain, blood in stool, constipation, diarrhea, nausea and vomiting.  Genitourinary:  Negative for hematuria.   Skin: Negative.   Neurological:  Negative for dizziness, headaches and light-headedness.   Hematological:  Does not bruise/bleed easily.    PHYSICAL EXAM: ***  Performance status (ECOG): 2 - Symptomatic, <50% confined to bed  There were no vitals filed for this visit.  Wt Readings from Last 3 Encounters:  09/24/23 187 lb (84.8 kg)  08/06/23 188 lb (85.3 kg)  06/19/23 187 lb (84.8 kg)   Physical Exam Constitutional:      Appearance: Normal appearance. She is normal weight.     Comments: Presents in wheelchair.  Nasal cannula in place.  Cardiovascular:     Heart sounds: Normal heart sounds.  Pulmonary:     Breath sounds: Decreased breath sounds present.  Chest:       Comments: No discrete nodules, masses, or lymphadenopathy in either breast.  No axillary supraclavicular, axillary, or pectoral lymphadenopathy, bilaterally. Neurological:     General: No focal deficit present.     Mental Status: Mental status is at baseline.  Psychiatric:        Behavior: Behavior normal. Behavior is cooperative.      PAST MEDICAL/SURGICAL HISTORY:  Past Medical History:  Diagnosis Date   Aortic atherosclerosis    Breast cancer (HCC) 2007   Stage I (T1b N0 M0), grade 1 well-differentiated carcinoma of the left breast status, post  lumpectomy followed by radiation therapy. Her estrogen receptor receptors were 93%, progesterone receptors 67%. HER-2/neu was negative. No lymphovascular space invasion was seen. All margins were clear. Ki-67 marker was low at 1% with surgery on 11/15/2004. Treated then with post-lumpectomy radiation, finish   Breast cancer, left breast (HCC) 2007   COPD (chronic obstructive pulmonary disease) (HCC)    Coronary artery calcification seen on CAT scan    Diabetes mellitus without complication (HCC)    diet controlled   Diverticula of colon    Hx of rickettsial disease    Hyperlipidemia    Hypertension    Kidney stones    Nicotine  dependence    Osteoarthritis    Past Surgical History:  Procedure Laterality Date   ABDOMINAL HYSTERECTOMY     BREAST  SURGERY  2005 approx   left lumpectomy    CATARACT EXTRACTION W/PHACO Left 05/01/2019   Procedure: CATARACT EXTRACTION PHACO AND INTRAOCULAR LENS PLACEMENT LEFT EYE CDE=11.73;  Surgeon: Harrie Agent, MD;  Location: [facility];  Service: Ophthalmology;  Laterality: Left;  left   CATARACT EXTRACTION W/PHACO Right 05/15/2019   Procedure: CATARACT EXTRACTION PHACO AND INTRAOCULAR LENS PLACEMENT RIGHT EYE;  Surgeon: Harrie Agent, MD;  Location: [facility];  Service: Ophthalmology;  Laterality: Right;  CDE: 16.07   CHOLECYSTECTOMY N/A 05/26/2014   Procedure: LAPAROSCOPIC CHOLECYSTECTOMY;  Surgeon: Oneil Budge Md, MD;  Location: [facility];  Service: General;  Laterality: N/A;   COLECTOMY     2005, diverticulitis   COLONOSCOPY N/A 05/20/2017    Surgeon: Harvey Margo CROME, MD;  five 3-6 mm polyps (4 tubular adenomas, 1 hyperplastic polyp), tortuous left colon, internal hemorrhoids.  Recommended repeat colonoscopy in 3 years.   COLONOSCOPY WITH PROPOFOL  N/A 07/12/2020   Procedure: COLONOSCOPY WITH PROPOFOL ;  Surgeon: Cindie Carlin POUR, DO;  Location: [facility];  Service: Endoscopy;  Laterality: N/A;  12:30pm   DILATION AND CURETTAGE OF UTERUS     ESOPHAGOGASTRODUODENOSCOPY N/A 05/20/2017   Surgeon: Harvey Margo CROME, MD; normal esophagus and mild gastritis due to aspirin .  Biopsies negative for H. pylori.   left breast      cancer, in 2006   POLYPECTOMY  07/12/2020   Procedure: POLYPECTOMY;  Surgeon: Cindie Carlin POUR, DO;  Location: [facility];  Service: Endoscopy;;   TUBAL LIGATION      SOCIAL HISTORY:  Social History   Socioeconomic History   Marital status: Single    Spouse name: Not on file   Number of children: 5   Years of education: 9th grade    Highest education level: 9th grade  Occupational History   Occupation: retired    Associate Professor: RETIRED  Tobacco Use   Smoking status: Former    Current packs/day: 0.00    Average packs/day: 0.5 packs/day for 50.0 years (25.0 ttl pk-yrs)    Types:  Cigarettes    Start date: 04/08/1972    Quit date: 04/09/2022    Years since quitting: 1.4   Smokeless tobacco: Never   Tobacco comments:       Vaping Use   Vaping status: Never Used  Substance and Sexual Activity   Alcohol use: No    Alcohol/week: 0.0 standard drinks of alcohol   Drug use: No   Sexual activity: Not Currently  Other Topics Concern   Not on file  Social History Narrative   ** Merged History Encounter ** 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 QUIT WORKING 2003 AFTER CANCER DIAGNOSIS.  MARRIED WITH A RUN AWAY HUSBAND(FUGITIVE 10 YRS). 3 MISCARRIAGES, 5 CHILDREN   Right handed    Social Drivers of Health   Financial Resource Strain: Low Risk  (02/26/2023)   Overall Financial Resource Strain (CARDIA)    Difficulty of Paying Living Expenses: Not hard at all  Food Insecurity: No Food Insecurity (02/26/2023)   Hunger Vital Sign    Worried About Running Out of Food in the Last Year: Never true    Ran Out of Food in the Last Year: Never true  Transportation Needs: No Transportation Needs (02/26/2023)   PRAPARE - Administrator, Civil Service (Medical): No    Lack of Transportation (Non-Medical): No  Physical Activity: Patient Declined (02/26/2023)   Exercise Vital Sign    Days of Exercise per Week: Patient declined    Minutes of Exercise per Session: Patient declined  Stress: No Stress Concern Present (02/26/2023)   [employer] of Occupational Health - Occupational Stress Questionnaire    Feeling of Stress : Not at all  Social Connections: Socially Isolated (02/26/2023)   Social Connection and Isolation Panel    Frequency of Communication with Friends and Family: More than three times a week    Frequency of Social Gatherings with Friends and Family: More than three times a week    Attends Religious Services: Never    Database administrator or Organizations: No    Attends Banker Meetings: Never    Marital  Status: Divorced  Catering manager Violence: Not At Risk (02/26/2023)   Humiliation, Afraid, Rape, and Kick questionnaire    Fear of Current or Ex-Partner: No    Emotionally Abused: No    Physically Abused: No    Sexually Abused: No    FAMILY HISTORY:  Family History  Problem Relation Age of Onset   Breast cancer Mother    COPD Father    Emphysema Father    Throat cancer Sister    Glaucoma Brother    Schizophrenia Brother    Heart attack Brother    Lung cancer Sister        former smoker   Hepatitis C Daughter    Seizures Daughter    SIDS Son    Colon cancer Neg Hx    Colon polyps Neg Hx     CURRENT MEDICATIONS:  Current Outpatient Medications  Medication Sig Dispense Refill   acetaminophen  (TYLENOL ) 325 MG tablet Take 650 mg by mouth every 6 (six) hours as needed for moderate pain.      albuterol  (PROVENTIL ) (2.5 MG/3ML) 0.083% nebulizer solution INHALE ONE VIAL VIA NEBULIZER THREE TIMES DAILY. (Patient taking differently: Take 2.5 mg by nebulization 3 (three) times daily as needed for wheezing or shortness of breath.) 300 mL 0   albuterol  (VENTOLIN  HFA) 108 (90 Base) MCG/ACT inhaler INHALE (2) PUFFS EVERY FOURHOURS AS NEEDED ONLY IF YOU CANT CATCH YOUR BREATH (Patient taking differently: Inhale 2 puffs into the lungs every 4 (four) hours as needed for wheezing or shortness of breath. INHALE (2) PUFFS EVERY FOURHOURS AS NEEDED ONLY IF YOU CANT CATCH YOUR BREATH) 8.5 g 0   bisoprolol  (ZEBETA ) 5 MG tablet Take 1 tablet (5 mg total) by mouth daily. 30 tablet 5   Calcium  Carbonate-Vitamin D  (CALCIUM  600 + D PO) Take 1 tablet by mouth 2 (two) times daily.     Cyanocobalamin  (VITAMIN B 12) 500 MCG TABS Take 500 mcg by mouth daily. Take one tablet by mouth once daily 90 tablet  3   cyclobenzaprine  (FLEXERIL ) 5 MG tablet Take 1 tablet (5 mg total) by mouth at bedtime. 30 tablet 5   ELIQUIS  5 MG TABS tablet TAKE 1 TABLET BY MOUTH TWICE A DAY 60 tablet 5   ENTRESTO  24-26 MG TAKE ONE  TABLET BY MOUTH TWICE DAILY 60 tablet 5   erythromycin  ophthalmic ointment Place 1 Application into both eyes at bedtime. Apply daily at bedtime to both eyes for 1 week 3.5 g 0   FARXIGA  10 MG TABS tablet Take 1 tablet (10 mg total) by mouth daily. 30 tablet 5   fluticasone  (FLONASE ) 50 MCG/ACT nasal spray PLACE 2 SPRAYS IN EACH NOSTRIL DAILY. 16 g 5   furosemide  (LASIX ) 20 MG tablet Take 1 tablet by mouth daily. May take an additional tablet as needed for fluid/edema 45 tablet 3   hydrOXYzine  (ATARAX ) 25 MG tablet TAKE ONE TABLET BY MOUTH TWICE DAILY AS NEEDED FOR ITCHING 60 tablet 1   ipratropium (ATROVENT  HFA) 17 MCG/ACT inhaler INHALE 2 PUFFS BY MOUTH FOUR TIMES A DAY. 12.9 g 3   Misc. Devices MISC Please provide patient with mastectomy bra and prosthesis. Dx: History of left breast cancer 1 each 0   Naphazoline HCl (CLEAR EYES OP) Place 1 drop into both eyes daily as needed (irritation).     pantoprazole  (PROTONIX ) 40 MG tablet Take 1 tablet (40 mg total) by mouth every morning. 90 tablet 2   polyethylene glycol powder (GLYCOLAX /MIRALAX ) powder MIX 1 CAPFUL IN 8 OUNCES OF JUICE OR WATER  AND DRINK ONCE DAILY. (Patient taking differently: Take 17 g by mouth daily.) 527 g 5   rosuvastatin  (CRESTOR ) 20 MG tablet Take 1 tablet (20 mg total) by mouth daily. 90 tablet 3   spironolactone  (ALDACTONE ) 25 MG tablet Take 1 tablet (25 mg total) by mouth daily. 90 tablet 1   SYMBICORT  160-4.5 MCG/ACT inhaler INHALE TWO PUFFS INTO THE LUNGS TWICE DAILY 10.2 g 0   traMADol  (ULTRAM ) 50 MG tablet TAKE (1) TABLET BY MOUTH EVERY 8 HOURS AS NEEDED. 60 tablet 3   UNABLE TO FIND Med Name: Incontinence pads - 1 bag monthly 1 Bag 11   UNABLE TO FIND Med Name: Large Chux - 60 monthly 60 each 11   UNABLE TO FIND Med Name: Wipes - 2 packs monthly 2 Box 11   UNABLE TO FIND Med Name: Large Gloves - 1 box monthly 1 Box 11   UNABLE TO FIND Med Name: Wipes 3 Package 3   No current facility-administered medications for this  visit.    ALLERGIES:  Allergies  Allergen Reactions   Penicillins Hives, Shortness Of Breath and Swelling    Has patient had a PCN reaction causing immediate rash, facial/tongue/throat swelling, SOB or lightheadedness with hypotension: yes Has patient had a PCN reaction causing severe rash involving mucus membranes or skin necrosis:no Has patient had a PCN reaction that required hospitalization: yes Has patient had a PCN reaction occurring within the last 10 years: no If all of the above answers are NO, then may proceed with Cephalosporin use. Tolerates rocephin /keflex    Sulfonamide Derivatives Other (See Comments)    Shaking all over, seizure like symptoms. Hospitalization resulted    Avapro  [Irbesartan ] Other (See Comments)    Pt associates avapro  with bruising though aware scientifically not the case, wants to chanmge med   Levaquin  [Levofloxacin ] Itching    LABORATORY DATA:  I have reviewed the labs as listed.     Latest Ref Rng & Units 10/01/2023  10:11 AM 04/07/2023    5:03 AM 02/19/2023    4:25 PM  CBC  WBC 4.0 - 10.5 K/uL 8.9  9.7  8.4   Hemoglobin 12.0 - 15.0 g/dL 86.7  87.7  86.3   Hematocrit 36.0 - 46.0 % 42.5  39.4  41.9   Platelets 150 - 400 K/uL 415  291  547       Latest Ref Rng & Units 10/01/2023   10:11 AM 08/06/2023   10:54 AM 04/23/2023    2:21 PM  CMP  Glucose 70 - 99 mg/dL 887  894  99   BUN 8 - 23 mg/dL 14  16  17    Creatinine 0.44 - 1.00 mg/dL 8.82  8.82  8.88   Sodium 135 - 145 mmol/L 138  140  144   Potassium 3.5 - 5.1 mmol/L 4.4  5.4  5.3   Chloride 98 - 111 mmol/L 102  100  101   CO2 22 - 32 mmol/L 26  24  26    Calcium  8.9 - 10.3 mg/dL 9.4  89.7  9.9   Total Protein 6.5 - 8.1 g/dL 7.1  7.2    Total Bilirubin 0.0 - 1.2 mg/dL 0.7  0.4    Alkaline Phos 38 - 126 U/L 72  89    AST 15 - 41 U/L 15  12    ALT 0 - 44 U/L 12  12      DIAGNOSTIC IMAGING:  I have independently reviewed the scans and discussed with the patient. No results found.    WRAP UP:  All questions were answered. The patient knows to call the clinic with any problems, questions or concerns.  Medical decision making: Moderate***  Time spent on visit: I spent 20 minutes counseling the patient face to face. The total time spent in the appointment was 30 minutes and more than 50% was on counseling.  Pleasant CHRISTELLA Barefoot, PA-C  ***

## 2023-10-08 ENCOUNTER — Inpatient Hospital Stay: Payer: 59 | Admitting: Physician Assistant

## 2023-10-10 ENCOUNTER — Other Ambulatory Visit: Payer: Self-pay | Admitting: Internal Medicine

## 2023-10-10 DIAGNOSIS — I5020 Unspecified systolic (congestive) heart failure: Secondary | ICD-10-CM

## 2023-10-12 NOTE — Progress Notes (Unsigned)
 [facility] [address], [place] [address]   CLINIC:  Medical Oncology/Hematology  PCP:  Antonetta Rollene BRAVO, MD [address] / [place] [address] [phone]   REASON FOR VISIT:  REASON FOR VISIT:  Follow-up for left breast cancer, leukocytosis and osteopenia   PRIOR THERAPY: Antiestrogen therapy completed.   NGS Results: Not applicable.   CURRENT THERAPY: Surveillance  BRIEF ONCOLOGIC HISTORY:   Oncology History  Breast cancer (HCC)  11/15/2004 Surgery   Lumpectomy    - 02/22/2005 Radiation Therapy     12/11/2004 -  Chemotherapy   Aromasin    Adverse Reaction   Intolerance to Aromasin    - 01/14/2010 Chemotherapy   Femara     CANCER STAGING: Cancer Staging  Breast cancer (HCC) Staging form: Breast, AJCC 7th Edition - Clinical: Stage IA (T1b, N0, cM0) - Signed by Tanya Debby RAMAN, PA on 03/11/2012   INTERVAL HISTORY:   [name], a 78 y.o. female, returns for routine follow-up of her history of breast cancer as well as osteopenia and leukocytosis.  Tara was last seen on 10/08/2022 by Pleasant Barefoot PA-C.  At today's visit, she  reports feeling ***.  She denies any recent hospitalizations, surgeries, or changes in her  baseline health status.  She denies any new breast lumps or axillary lymphadenopathy.  *** ***No new onset bone pain, chest pain, dyspnea, or abdominal pain.   ***She has no new headaches, seizures, or focal neurologic deficits.   ***No B symptoms such as fever, chills, night sweats, unintentional weight loss.  She quit smoking in April 2024. *** She denies any recent infections or antibiotics.***  She does take daily steroid containing inhaler (Symbicort ). *** She denies any rectal bleeding or melena apart from scant drops of blood when straining for bowel movement.  *** She takes iron tablet daily.  She reports 80***% energy and 100***% appetite.   ***She is maintaining stable weight  at this time.   ASSESSMENT & PLAN:  1.  Stage I (T1 N0 M0) left breast IDC: - Diagnosis in 2006, status post lumpectomy and SLNB. - Pathology: 1 cm low-grade IDC, 0/1 lymph node positive, estrogen positive, progesterone positive, Ki-67 1%, HER2 1+. - Completed adjuvant radiation therapy around 02/22/2005. - Aromasin started on 11/2004, switched to Femara due to intolerance.  Completed 5 years of Femara on 01/14/2010. - Reviewed mammogram from 10/15/2022, BI-RADS Category 2, benign.   - Physical exam today (***) showed left breast lumpectomy scar within normal limits.  No discrete nodules, masses, or lymphadenopathy in bilateral breast. - Labs (10/01/2023) show baseline CBC/D, normal LFTs, baseline creatinine.   - No red flag symptoms per history/ROS.  ***She does report some occasional nipple drainage from left nipple that has been ongoing ever since lumpectomy surgery. - PLAN: She is due for annual screening mammogram, which has been ordered by her PCP and scheduled for 10/18/2023 - RTC in 1 year for annual follow-up of breast cancer *** discharge to PCP??? ***  2.  Leukocytosis and thrombocytosis: - She has intermittent leukocytosis and thrombocytosis since 2008. - No B symptoms or frequent infections. - She is a former smoker (quit April 2024). - She takes a daily steroid containing inhaler (Symbicort )*** - BCR/ABL FISH negative. - Most recent labs (10/01/2023): Normal WBC/differential.  Mildly elevated platelets 415. - PLAN: Most likely reactive.  We will check JAK2 with reflex today to rule out MPN.  *** If no mutations identified,  recommend discharge to PCP for surveillance.  ***  3.  Iron deficiency without anemia - Labs from 08/16/2022 showed ferritin 23, iron saturation 8% - Colonoscopy (07/12/2020): Nonbleeding internal hemorrhoids, diverticulosis, polyp x 1 - Endoscopy (05/20/2017): Normal esophagus, mild gastritis. - Occasional scant rectal bleeding when straining for bowel movement.   No gross hematochezia or melena.  Denies any fatigue or pica.*** - Taking iron tablet daily since September 2024.  *** - Most recent labs (10/01/2023): Normal hemoglobin 13.2.  Ferritin 31, iron saturation 17%. - PLAN: *** Continue iron pill unless severely symptomatic (then consider IV iron?)?  *** - *** PCP follow-up?  ***   4.  Osteopenia: - Vitamin D  level was 43.18 in September 2025. - Last denosumab  was on 12/02/2020. - Last bone density scan 04/18/2020 showed T-score -2.0, osteopenic - PLAN: Since she is no longer on breast cancer treatment, we will defer ongoing management of osteopenia to her PCP.  5.   Smoking history: - Quit smoking in April 2024 - Lung cancer screening scan on 03/23/2022 was lung RADS 2, benign appearance/behavior. - Patient canceled LDCT chest that was scheduled for March 2025.  She did not reschedule. - PLAN: *** Reschedule missed LDCT chest?  ***   6.  Family/social history: - Continues to smoke 5-10 cigarettes/day.  Started smoking at age 66. - Mother had breast cancer.  PLAN SUMMARY: *** TBD *** possible discharge to PCP, versus annual follow-up?  *** >> Overdue for annual LDCT chest (patient canceled CT that was scheduled in March 2025) >> Labs in 1 year = CBC/D, CMP, vitamin D , ferritin, iron/TIBC >> OFFICE visit in 1 year   REVIEW OF SYSTEMS: ***  Review of Systems - Oncology  PHYSICAL EXAM:   Performance status (ECOG): {CHL ONC ED:8845999799} *** There were no vitals filed for this visit. Wt Readings from Last 3 Encounters:  09/24/23 187 lb (84.8 kg)  08/06/23 188 lb (85.3 kg)  06/19/23 187 lb (84.8 kg)   Physical Exam   PAST MEDICAL/SURGICAL HISTORY:  Past Medical History:  Diagnosis Date   Aortic atherosclerosis    Breast cancer (HCC) 2007   Stage I (T1b N0 M0), grade 1 well-differentiated carcinoma of the left breast status, post lumpectomy followed by radiation therapy. Her estrogen receptor receptors were 93%, progesterone receptors  67%. HER-2/neu was negative. No lymphovascular space invasion was seen. All margins were clear. Ki-67 marker was low at 1% with surgery on 11/15/2004. Treated then with post-lumpectomy radiation, finish   Breast cancer, left breast (HCC) 2007   COPD (chronic obstructive pulmonary disease) (HCC)    Coronary artery calcification seen on CAT scan    Diabetes mellitus without complication (HCC)    diet controlled   Diverticula of colon    Hx of rickettsial disease    Hyperlipidemia    Hypertension    Kidney stones    Nicotine  dependence    Osteoarthritis    Past Surgical History:  Procedure Laterality Date   ABDOMINAL HYSTERECTOMY     BREAST SURGERY  2005 approx   left lumpectomy    CATARACT EXTRACTION W/PHACO Left 05/01/2019   Procedure: CATARACT EXTRACTION PHACO AND INTRAOCULAR LENS PLACEMENT LEFT EYE CDE=11.73;  Surgeon: Harrie Agent, MD;  Location: [facility];  Service: Ophthalmology;  Laterality: Left;  left   CATARACT EXTRACTION W/PHACO Right 05/15/2019   Procedure: CATARACT EXTRACTION PHACO AND INTRAOCULAR LENS PLACEMENT RIGHT EYE;  Surgeon: Harrie Agent, MD;  Location: [facility];  Service: Ophthalmology;  Laterality: Right;  CDE:  16.07   CHOLECYSTECTOMY N/A 05/26/2014   Procedure: LAPAROSCOPIC CHOLECYSTECTOMY;  Surgeon: Oneil Budge Md, MD;  Location: [facility];  Service: General;  Laterality: N/A;   COLECTOMY     2005, diverticulitis   COLONOSCOPY N/A 05/20/2017    Surgeon: Harvey Margo CROME, MD;  five 3-6 mm polyps (4 tubular adenomas, 1 hyperplastic polyp), tortuous left colon, internal hemorrhoids.  Recommended repeat colonoscopy in 3 years.   COLONOSCOPY WITH PROPOFOL  N/A 07/12/2020   Procedure: COLONOSCOPY WITH PROPOFOL ;  Surgeon: Cindie Carlin POUR, DO;  Location: [facility];  Service: Endoscopy;  Laterality: N/A;  12:30pm   DILATION AND CURETTAGE OF UTERUS     ESOPHAGOGASTRODUODENOSCOPY N/A 05/20/2017   Surgeon: Harvey Margo CROME, MD; normal esophagus and mild gastritis due to aspirin .   Biopsies negative for H. pylori.   left breast      cancer, in 2006   POLYPECTOMY  07/12/2020   Procedure: POLYPECTOMY;  Surgeon: Cindie Carlin POUR, DO;  Location: [facility];  Service: Endoscopy;;   TUBAL LIGATION      SOCIAL HISTORY:  Social History   Socioeconomic History   Marital status: Single    Spouse name: Not on file   Number of children: 5   Years of education: 9th grade    Highest education level: 9th grade  Occupational History   Occupation: retired    Associate Professor: RETIRED  Tobacco Use   Smoking status: Former    Current packs/day: 0.00    Average packs/day: 0.5 packs/day for 50.0 years (25.0 ttl pk-yrs)    Types: Cigarettes    Start date: 04/08/1972    Quit date: 04/09/2022    Years since quitting: 1.5   Smokeless tobacco: Never   Tobacco comments:       Vaping Use   Vaping status: Never Used  Substance and Sexual Activity   Alcohol use: No    Alcohol/week: 0.0 standard drinks of alcohol   Drug use: No   Sexual activity: Not Currently  Other Topics Concern   Not on file  Social History Narrative   ** Merged History Encounter ** 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 QUIT WORKING 2003 AFTER CANCER DIAGNOSIS.      MARRIED WITH A RUN AWAY HUSBAND(FUGITIVE 10 YRS). 3 MISCARRIAGES, 5 CHILDREN   Right handed    Social Drivers of Health   Financial Resource Strain: Low Risk  (02/26/2023)   Overall Financial Resource Strain (CARDIA)    Difficulty of Paying Living Expenses: Not hard at all  Food Insecurity: No Food Insecurity (02/26/2023)   Hunger Vital Sign    Worried About Running Out of Food in the Last Year: Never true    Ran Out of Food in the Last Year: Never true  Transportation Needs: No Transportation Needs (02/26/2023)   PRAPARE - Administrator, Civil Service (Medical): No    Lack of Transportation (Non-Medical): No  Physical Activity: Patient Declined (02/26/2023)   Exercise Vital Sign    Days of Exercise per  Week: Patient declined    Minutes of Exercise per Session: Patient declined  Stress: No Stress Concern Present (02/26/2023)   [employer] of Occupational Health - Occupational Stress Questionnaire    Feeling of Stress : Not at all  Social Connections: Socially Isolated (02/26/2023)   Social Connection and Isolation Panel    Frequency of Communication with Friends and Family: More than three times a week    Frequency of Social Gatherings with Friends and  Family: More than three times a week    Attends Religious Services: Never    Active Member of Clubs or Organizations: No    Attends Banker Meetings: Never    Marital Status: Divorced  Catering manager Violence: Not At Risk (02/26/2023)   Humiliation, Afraid, Rape, and Kick questionnaire    Fear of Current or Ex-Partner: No    Emotionally Abused: No    Physically Abused: No    Sexually Abused: No    FAMILY HISTORY:  Family History  Problem Relation Age of Onset   Breast cancer Mother    COPD Father    Emphysema Father    Throat cancer Sister    Glaucoma Brother    Schizophrenia Brother    Heart attack Brother    Lung cancer Sister        former smoker   Hepatitis C Daughter    Seizures Daughter    SIDS Son    Colon cancer Neg Hx    Colon polyps Neg Hx     CURRENT MEDICATIONS:  Current Outpatient Medications  Medication Sig Dispense Refill   acetaminophen  (TYLENOL ) 325 MG tablet Take 650 mg by mouth every 6 (six) hours as needed for moderate pain.      albuterol  (PROVENTIL ) (2.5 MG/3ML) 0.083% nebulizer solution INHALE ONE VIAL VIA NEBULIZER THREE TIMES DAILY. (Patient taking differently: Take 2.5 mg by nebulization 3 (three) times daily as needed for wheezing or shortness of breath.) 300 mL 0   albuterol  (VENTOLIN  HFA) 108 (90 Base) MCG/ACT inhaler INHALE (2) PUFFS EVERY FOURHOURS AS NEEDED ONLY IF YOU CANT CATCH YOUR BREATH (Patient taking differently: Inhale 2 puffs into the lungs every 4 (four) hours as  needed for wheezing or shortness of breath. INHALE (2) PUFFS EVERY FOURHOURS AS NEEDED ONLY IF YOU CANT CATCH YOUR BREATH) 8.5 g 0   bisoprolol  (ZEBETA ) 5 MG tablet Take 1 tablet (5 mg total) by mouth daily. 30 tablet 5   Calcium  Carbonate-Vitamin D  (CALCIUM  600 + D PO) Take 1 tablet by mouth 2 (two) times daily.     Cyanocobalamin  (VITAMIN B 12) 500 MCG TABS Take 500 mcg by mouth daily. Take one tablet by mouth once daily 90 tablet 3   cyclobenzaprine  (FLEXERIL ) 5 MG tablet Take 1 tablet (5 mg total) by mouth at bedtime. 30 tablet 5   ELIQUIS  5 MG TABS tablet TAKE 1 TABLET BY MOUTH TWICE A DAY 60 tablet 5   ENTRESTO  24-26 MG TAKE ONE TABLET BY MOUTH TWICE DAILY 60 tablet 5   erythromycin  ophthalmic ointment Place 1 Application into both eyes at bedtime. Apply daily at bedtime to both eyes for 1 week 3.5 g 0   FARXIGA  10 MG TABS tablet Take 1 tablet (10 mg total) by mouth daily. 30 tablet 5   fluticasone  (FLONASE ) 50 MCG/ACT nasal spray PLACE 2 SPRAYS IN EACH NOSTRIL DAILY. 16 g 5   furosemide  (LASIX ) 20 MG tablet Take 1 tablet by mouth daily. May take an additional tablet as needed for fluid/edema 45 tablet 3   hydrOXYzine  (ATARAX ) 25 MG tablet TAKE ONE TABLET BY MOUTH TWICE DAILY AS NEEDED FOR ITCHING 60 tablet 1   ipratropium (ATROVENT  HFA) 17 MCG/ACT inhaler INHALE 2 PUFFS BY MOUTH FOUR TIMES A DAY. 12.9 g 3   Misc. Devices MISC Please provide patient with mastectomy bra and prosthesis. Dx: History of left breast cancer 1 each 0   Naphazoline HCl (CLEAR EYES OP) Place 1 drop into both  eyes daily as needed (irritation).     pantoprazole  (PROTONIX ) 40 MG tablet Take 1 tablet (40 mg total) by mouth every morning. 90 tablet 2   polyethylene glycol powder (GLYCOLAX /MIRALAX ) powder MIX 1 CAPFUL IN 8 OUNCES OF JUICE OR WATER  AND DRINK ONCE DAILY. (Patient taking differently: Take 17 g by mouth daily.) 527 g 5   rosuvastatin  (CRESTOR ) 20 MG tablet Take 1 tablet (20 mg total) by mouth daily. 90 tablet 3    spironolactone  (ALDACTONE ) 25 MG tablet Take 1 tablet (25 mg total) by mouth daily. 90 tablet 1   SYMBICORT  160-4.5 MCG/ACT inhaler INHALE TWO PUFFS INTO THE LUNGS TWICE DAILY 10.2 g 0   traMADol  (ULTRAM ) 50 MG tablet TAKE (1) TABLET BY MOUTH EVERY 8 HOURS AS NEEDED. 60 tablet 3   UNABLE TO FIND Med Name: Incontinence pads - 1 bag monthly 1 Bag 11   UNABLE TO FIND Med Name: Large Chux - 60 monthly 60 each 11   UNABLE TO FIND Med Name: Wipes - 2 packs monthly 2 Box 11   UNABLE TO FIND Med Name: Large Gloves - 1 box monthly 1 Box 11   UNABLE TO FIND Med Name: Wipes 3 Package 3   No current facility-administered medications for this visit.    ALLERGIES:  Allergies  Allergen Reactions   Penicillins Hives, Shortness Of Breath and Swelling    Has patient had a PCN reaction causing immediate rash, facial/tongue/throat swelling, SOB or lightheadedness with hypotension: yes Has patient had a PCN reaction causing severe rash involving mucus membranes or skin necrosis:no Has patient had a PCN reaction that required hospitalization: yes Has patient had a PCN reaction occurring within the last 10 years: no If all of the above answers are NO, then may proceed with Cephalosporin use. Tolerates rocephin /keflex    Sulfonamide Derivatives Other (See Comments)    Shaking all over, seizure like symptoms. Hospitalization resulted    Avapro  [Irbesartan ] Other (See Comments)    Pt associates avapro  with bruising though aware scientifically not the case, wants to chanmge med   Levaquin  [Levofloxacin ] Itching    LABORATORY DATA:  I have reviewed the labs as listed.     Latest Ref Rng & Units 10/01/2023   10:11 AM 04/07/2023    5:03 AM 02/19/2023    4:25 PM  CBC  WBC 4.0 - 10.5 K/uL 8.9  9.7  8.4   Hemoglobin 12.0 - 15.0 g/dL 86.7  87.7  86.3   Hematocrit 36.0 - 46.0 % 42.5  39.4  41.9   Platelets 150 - 400 K/uL 415  291  547       Latest Ref Rng & Units 10/01/2023   10:11 AM 08/06/2023   10:54 AM  04/23/2023    2:21 PM  CMP  Glucose 70 - 99 mg/dL 887  894  99   BUN 8 - 23 mg/dL 14  16  17    Creatinine 0.44 - 1.00 mg/dL 8.82  8.82  8.88   Sodium 135 - 145 mmol/L 138  140  144   Potassium 3.5 - 5.1 mmol/L 4.4  5.4  5.3   Chloride 98 - 111 mmol/L 102  100  101   CO2 22 - 32 mmol/L 26  24  26    Calcium  8.9 - 10.3 mg/dL 9.4  89.7  9.9   Total Protein 6.5 - 8.1 g/dL 7.1  7.2    Total Bilirubin 0.0 - 1.2 mg/dL 0.7  0.4    Alkaline Phos  38 - 126 U/L 72  89    AST 15 - 41 U/L 15  12    ALT 0 - 44 U/L 12  12      DIAGNOSTIC IMAGING:  I have independently reviewed the scans and discussed with the patient. No results found.   WRAP UP:  All questions were answered. The patient knows to call the clinic with any problems, questions or concerns.  Medical decision making: ***  Time spent on visit: I spent {CHL ONC TIME VISIT - DTPQU:[id number]} counseling the patient face to face. The total time spent in the appointment was {CHL ONC TIME VISIT - DTPQU:[id number]} and more than 50% was on counseling.  Pleasant CHRISTELLA Barefoot, PA-C  ***

## 2023-10-14 ENCOUNTER — Inpatient Hospital Stay (HOSPITAL_BASED_OUTPATIENT_CLINIC_OR_DEPARTMENT_OTHER): Admitting: Physician Assistant

## 2023-10-14 VITALS — BP 141/70 | HR 96 | Temp 99.6°F | Resp 20 | Ht 63.0 in | Wt 185.2 lb

## 2023-10-14 DIAGNOSIS — M8580 Other specified disorders of bone density and structure, unspecified site: Secondary | ICD-10-CM | POA: Diagnosis not present

## 2023-10-14 DIAGNOSIS — C50212 Malignant neoplasm of upper-inner quadrant of left female breast: Secondary | ICD-10-CM | POA: Diagnosis not present

## 2023-10-14 DIAGNOSIS — Z87891 Personal history of nicotine dependence: Secondary | ICD-10-CM | POA: Diagnosis not present

## 2023-10-14 DIAGNOSIS — E611 Iron deficiency: Secondary | ICD-10-CM | POA: Diagnosis not present

## 2023-10-14 DIAGNOSIS — Z803 Family history of malignant neoplasm of breast: Secondary | ICD-10-CM | POA: Diagnosis not present

## 2023-10-14 DIAGNOSIS — D75839 Thrombocytosis, unspecified: Secondary | ICD-10-CM | POA: Diagnosis not present

## 2023-10-14 DIAGNOSIS — Z79811 Long term (current) use of aromatase inhibitors: Secondary | ICD-10-CM | POA: Diagnosis not present

## 2023-10-14 DIAGNOSIS — C50912 Malignant neoplasm of unspecified site of left female breast: Secondary | ICD-10-CM | POA: Diagnosis not present

## 2023-10-14 DIAGNOSIS — D72829 Elevated white blood cell count, unspecified: Secondary | ICD-10-CM | POA: Diagnosis not present

## 2023-10-14 DIAGNOSIS — Z923 Personal history of irradiation: Secondary | ICD-10-CM | POA: Diagnosis not present

## 2023-10-14 DIAGNOSIS — Z170 Estrogen receptor positive status [ER+]: Secondary | ICD-10-CM

## 2023-10-14 NOTE — Patient Instructions (Signed)
 [facility] at [facility] **VISIT SUMMARY & IMPORTANT INSTRUCTIONS **   You were seen today by Pleasant Barefoot PA-C for your history of breast cancer.    HISTORY OF BREAST CANCER Your labs and physical exam did not show any evidence of returning breast cancer at this time. You can discharge from [facility], but should continue to see your primary care physician (Dr. Rollene Pesa) for annual mammogram and breast exam.  IRON DEFICIENCY Continue taking iron tablet once daily.   ** Thank you for trusting me with your healthcare!  I strive to provide all of my patients with quality care at each visit.  If you receive a survey for this visit, I would be so grateful to you for taking the time to provide feedback.  Thank you in advance!  ~ Mizraim Harmening                                        Dr. Mickiel Davonna Pleasant Barefoot, PA-C     Delon Hope, NP   - - - - - - - - - - - - - - - - - -     Thank you for choosing [facility] at [facility] to provide your oncology and hematology care.  To afford each patient quality time with our provider, please arrive at least 15 minutes before your scheduled appointment time.   If you have a lab appointment with the Cancer Center please come in thru the Main Entrance and check in at the main information desk.  You need to re-schedule your appointment should you arrive 10 or more minutes late.  We strive to give you quality time with our providers, and arriving late affects you and other patients whose appointments are after yours.  Also, if you no show three or more times for appointments you may be dismissed from the clinic at the providers discretion.     Again, thank you for choosing [facility].  Our hope is that these requests will decrease the amount of time that you wait before being seen by our physicians.        _____________________________________________________________  Should you have questions after your visit to [facility], please contact our office at [phone] and follow the prompts.  Our office hours are 8:00 a.m. and 4:30 p.m. Monday - Friday.  Please note that voicemails left after 4:00 p.m. may not be returned until the following business day.  We are closed weekends and major holidays.  You do have access to a nurse 24-7, just call the main number to the clinic [phone] and do not press any options, hold on the line and a nurse will answer the phone.    For prescription refill requests, have your pharmacy contact our office and allow 72 hours.

## 2023-10-15 ENCOUNTER — Other Ambulatory Visit: Payer: Self-pay | Admitting: Family Medicine

## 2023-10-15 DIAGNOSIS — R918 Other nonspecific abnormal finding of lung field: Secondary | ICD-10-CM

## 2023-10-15 DIAGNOSIS — J4489 Other specified chronic obstructive pulmonary disease: Secondary | ICD-10-CM

## 2023-10-18 ENCOUNTER — Encounter (HOSPITAL_COMMUNITY): Payer: Self-pay

## 2023-10-18 ENCOUNTER — Ambulatory Visit (HOSPITAL_COMMUNITY)
Admission: RE | Admit: 2023-10-18 | Discharge: 2023-10-18 | Disposition: A | Discharge status: Home / Self Care | Source: Ambulatory Visit | Attending: Family Medicine | Admitting: Family Medicine

## 2023-10-18 DIAGNOSIS — Z1231 Encounter for screening mammogram for malignant neoplasm of breast: Secondary | ICD-10-CM | POA: Diagnosis not present

## 2023-10-29 ENCOUNTER — Other Ambulatory Visit: Payer: Self-pay | Admitting: Cardiology

## 2023-10-29 DIAGNOSIS — I5020 Unspecified systolic (congestive) heart failure: Secondary | ICD-10-CM

## 2023-11-01 ENCOUNTER — Ambulatory Visit: Payer: Self-pay

## 2023-11-01 DIAGNOSIS — I5021 Acute systolic (congestive) heart failure: Secondary | ICD-10-CM | POA: Diagnosis not present

## 2023-11-01 DIAGNOSIS — J9601 Acute respiratory failure with hypoxia: Secondary | ICD-10-CM | POA: Diagnosis not present

## 2023-11-01 DIAGNOSIS — M5410 Radiculopathy, site unspecified: Secondary | ICD-10-CM | POA: Diagnosis not present

## 2023-11-01 DIAGNOSIS — J441 Chronic obstructive pulmonary disease with (acute) exacerbation: Secondary | ICD-10-CM | POA: Diagnosis not present

## 2023-11-01 NOTE — Telephone Encounter (Signed)
Mailed results to patient

## 2023-11-15 ENCOUNTER — Other Ambulatory Visit: Payer: Self-pay | Admitting: Family Medicine

## 2023-11-15 DIAGNOSIS — R918 Other nonspecific abnormal finding of lung field: Secondary | ICD-10-CM

## 2023-11-15 DIAGNOSIS — J4489 Other specified chronic obstructive pulmonary disease: Secondary | ICD-10-CM

## 2023-11-18 ENCOUNTER — Encounter: Payer: Self-pay | Admitting: Radiology

## 2023-12-03 ENCOUNTER — Emergency Department (HOSPITAL_COMMUNITY)

## 2023-12-03 ENCOUNTER — Other Ambulatory Visit: Payer: Self-pay

## 2023-12-03 ENCOUNTER — Emergency Department (HOSPITAL_COMMUNITY)
Admission: EM | Admit: 2023-12-03 | Discharge: 2023-12-03 | Disposition: A | Discharge status: Home / Self Care | Attending: Emergency Medicine | Admitting: Emergency Medicine

## 2023-12-03 ENCOUNTER — Encounter (HOSPITAL_COMMUNITY): Payer: Self-pay | Admitting: Emergency Medicine

## 2023-12-03 DIAGNOSIS — S7001XA Contusion of right hip, initial encounter: Secondary | ICD-10-CM | POA: Diagnosis not present

## 2023-12-03 DIAGNOSIS — Y92009 Unspecified place in unspecified non-institutional (private) residence as the place of occurrence of the external cause: Secondary | ICD-10-CM | POA: Diagnosis not present

## 2023-12-03 DIAGNOSIS — Z7901 Long term (current) use of anticoagulants: Secondary | ICD-10-CM | POA: Diagnosis not present

## 2023-12-03 DIAGNOSIS — S40021A Contusion of right upper arm, initial encounter: Secondary | ICD-10-CM | POA: Insufficient documentation

## 2023-12-03 DIAGNOSIS — W0110XA Fall on same level from slipping, tripping and stumbling with subsequent striking against unspecified object, initial encounter: Secondary | ICD-10-CM | POA: Insufficient documentation

## 2023-12-03 DIAGNOSIS — S0101XA Laceration without foreign body of scalp, initial encounter: Secondary | ICD-10-CM | POA: Insufficient documentation

## 2023-12-03 DIAGNOSIS — W19XXXA Unspecified fall, initial encounter: Secondary | ICD-10-CM

## 2023-12-03 DIAGNOSIS — S0990XA Unspecified injury of head, initial encounter: Secondary | ICD-10-CM

## 2023-12-03 NOTE — ED Triage Notes (Addendum)
 Pt bib rcems for fall at home. Pt is legally blind and tripped over bedside table. She has small lac to right side of head and denies any loc. Pt also c/o right hip pain and right upper arm pain since fall.

## 2023-12-03 NOTE — ED Provider Notes (Signed)
 [facility] EMERGENCY DEPARTMENT AT [facility] Provider Note   CSN: [id number] Arrival date & time: 12/03/23  1601     Patient presents with: [name]   [name] is a 78 y.o. female.  {Add pertinent medical, surgical, social history, OB history to YEP:67052} Patient fell and hit her head.  No loss of consciousness she complains of some right hip pain and pain in her right humerus and headache   Fall       Prior to Admission medications   Medication Sig Start Date End Date Taking? Authorizing Provider  acetaminophen  (TYLENOL ) 500 MG tablet Take 500 mg by mouth every 6 (six) hours as needed for moderate pain (pain score 4-6).   Yes [provider]  albuterol  (VENTOLIN  HFA) 108 (90 Base) MCG/ACT inhaler INHALE (2) PUFFS EVERY FOURHOURS AS NEEDED ONLY IF YOU CANT CATCH YOUR BREATH Patient taking differently: Inhale 2 puffs into the lungs in the morning and at bedtime. 05/12/21  Yes Tobie Suzzane POUR, MD  hydrOXYzine  (ATARAX ) 25 MG tablet TAKE ONE TABLET BY MOUTH TWICE DAILY AS NEEDED FOR ITCHING Patient taking differently: Take 25 mg by mouth 2 (two) times daily as needed for anxiety. 09/23/23  Yes Antonetta Rollene BRAVO, MD  SYMBICORT  160-4.5 MCG/ACT inhaler INHALE TWO PUFFS INTO THE LUNGS TWICE DAILY Patient taking differently: Inhale 2 puffs into the lungs in the morning and at bedtime. 11/18/23  Yes Antonetta Rollene BRAVO, MD  traMADol  (ULTRAM ) 50 MG tablet TAKE (1) TABLET BY MOUTH EVERY 8 HOURS AS NEEDED. Patient taking differently: Take 50 mg by mouth 2 (two) times daily. TAKE (1) TABLET BY MOUTH EVERY 8 HOURS AS NEEDED. 07/18/23  Yes Brenna Lin, MD  albuterol  (PROVENTIL ) (2.5 MG/3ML) 0.083% nebulizer solution INHALE ONE VIAL VIA NEBULIZER THREE TIMES DAILY. Patient not taking: Reported on 12/03/2023 07/08/18   Moishe Chiquita HERO, NP  bisoprolol  (ZEBETA ) 5 MG tablet Take 1 tablet (5 mg total) by mouth daily. 06/06/23   Antonetta Rollene BRAVO, MD  Calcium  Carbonate-Vitamin D   (CALCIUM  600 + D PO) Take 1 tablet by mouth 2 (two) times daily.    [provider]  Cyanocobalamin  (VITAMIN B 12) 500 MCG TABS Take 500 mcg by mouth daily. Take one tablet by mouth once daily 08/18/20   Antonetta Rollene BRAVO, MD  cyclobenzaprine  (FLEXERIL ) 5 MG tablet Take 1 tablet (5 mg total) by mouth at bedtime. 05/24/22   Antonetta Rollene BRAVO, MD  ELIQUIS  5 MG TABS tablet TAKE 1 TABLET BY MOUTH TWICE A DAY 07/17/23   Antonetta Rollene BRAVO, MD  ENTRESTO  24-26 MG TAKE ONE TABLET BY MOUTH TWICE DAILY 07/17/23   Antonetta Rollene BRAVO, MD  erythromycin  ophthalmic ointment Place 1 Application into both eyes at bedtime. Apply daily at bedtime to both eyes for 1 week 01/11/23   Antonetta Rollene BRAVO, MD  FARXIGA  10 MG TABS tablet Take 1 tablet (10 mg total) by mouth daily. 07/17/23   Antonetta Rollene BRAVO, MD  fluticasone  (FLONASE ) 50 MCG/ACT nasal spray PLACE 2 SPRAYS IN EACH NOSTRIL DAILY. 09/27/23   Antonetta Rollene BRAVO, MD  furosemide  (LASIX ) 20 MG tablet Take 1 tablet by mouth daily. May take an additional tablet as needed for fluid/edema 10/31/23   Alvan Dorn FALCON, MD  ipratropium (ATROVENT  HFA) 17 MCG/ACT inhaler INHALE 2 PUFFS BY MOUTH FOUR TIMES A DAY. Patient not taking: Reported on 12/03/2023 09/17/23   Antonetta Rollene BRAVO, MD  Misc. Devices MISC Please provide patient with mastectomy bra and prosthesis.  Dx: History of left breast cancer 12/04/22   Antonetta Rollene BRAVO, MD  Naphazoline HCl (CLEAR EYES OP) Place 1 drop into both eyes daily as needed (irritation).    [provider]  pantoprazole  (PROTONIX ) 40 MG tablet Take 1 tablet (40 mg total) by mouth every morning. 06/19/23   Wert, Michael B, MD  polyethylene glycol powder (GLYCOLAX /MIRALAX ) powder MIX 1 CAPFUL IN 8 OUNCES OF JUICE OR WATER  AND DRINK ONCE DAILY. Patient taking differently: Take 17 g by mouth daily. 04/23/16   Antonetta Rollene BRAVO, MD  rosuvastatin  (CRESTOR ) 20 MG tablet Take 1 tablet (20 mg total) by mouth daily. 01/30/23   Alvan Dorn FALCON, MD  spironolactone  (ALDACTONE ) 25 MG tablet Take 1 tablet (25 mg total) by mouth daily. 10/10/23   Antonetta Rollene BRAVO, MD  UNABLE TO FIND Med Name: Incontinence pads - 1 bag monthly 08/05/23   Antonetta Rollene BRAVO, MD  UNABLE TO FIND Med Name: Large Chux - 60 monthly 08/05/23   Antonetta Rollene BRAVO, MD  UNABLE TO FIND Med Name: Wipes - 2 packs monthly 08/05/23   Antonetta Rollene BRAVO, MD  UNABLE TO FIND Med Name: Large Gloves - 1 box monthly 08/05/23   Antonetta Rollene BRAVO, MD  UNABLE TO FIND Med Name: Wipes 08/06/23   Antonetta Rollene BRAVO, MD    Allergies: Penicillins, Sulfonamide derivatives, Avapro  [irbesartan ], and Levaquin  [levofloxacin ]    Review of Systems  Updated Vital Signs BP (!) 149/77 (BP Location: Right Arm)   Pulse 65   Temp 98.3 F (36.8 C)   Resp 18   Ht 5' 3 (1.6 m)   Wt 84 kg   SpO2 97%   BMI 32.80 kg/m   Physical Exam  (all labs ordered are listed, but only abnormal results are displayed) Labs Reviewed - No data to display  EKG: None  Radiology: DG Humerus Right Result Date: 12/03/2023 EXAM: 1 VIEW(S) XRAY OF THE HUMERUS 12/03/2023 04:51:40 PM COMPARISON: None available. CLINICAL HISTORY: fall FINDINGS: BONES AND JOINTS: No acute fracture. No focal osseous lesion. No joint dislocation. Degenerative changes of the shoulder and elbow joints. SOFT TISSUES: The soft tissues are unremarkable. IMPRESSION: 1. No acute fracture or dislocation. Electronically signed by: Morgane Naveau MD 12/03/2023 05:06 PM EST RP Workstation: [id number]   DG Hip Unilat W or Wo Pelvis 2-3 Views Right Result Date: 12/03/2023 EXAM: 2 OR MORE VIEW(S) XRAY OF THE RIGHT HIP 12/03/2023 04:51:40 PM COMPARISON: 04/07/2023 CLINICAL HISTORY: fall FINDINGS: BONES AND JOINTS: No acute fracture or focal osseous lesion. The hip joint is maintained. Mild degenerative changes of the RIGHT hip. SOFT TISSUES: Left lower quadrant surgical clips noted. Vascular calcifications. IMPRESSION: 1. No acute  fracture or dislocation. Electronically signed by: Morgane Naveau MD 12/03/2023 05:04 PM EST RP Workstation: [id number]   CT Head Wo Contrast Result Date: 12/03/2023 EXAM: CT HEAD WITHOUT CONTRAST 12/03/2023 04:44:21 PM TECHNIQUE: CT of the head was performed without the administration of intravenous contrast. Automated exposure control, iterative reconstruction, and/or weight based adjustment of the mA/kV was utilized to reduce the radiation dose to as low as reasonably achievable. COMPARISON: Head CT 04/07/2023 and MRI 02/11/2023. CLINICAL HISTORY: Head trauma, intracranial arterial injury suspected. FINDINGS: BRAIN AND VENTRICLES: There is no evidence of an acute infarct, intracranial hemorrhage, mass, midline shift, hydrocephalus, or extra-axial fluid collection. There is mild cerebral atrophy. Cerebral white matter hypodensities are similar to the prior CT and nonspecific but compatible with mild chronic small vessel ischemic disease. A partially empty sella  is noted. A small chronic infarct medially in the right cerebellar hemisphere is unchanged. Calcified atherosclerosis at the skull base. ORBITS: Bilateral cataract extraction. SINUSES: No acute abnormality. SOFT TISSUES AND SKULL: No acute soft tissue abnormality. No skull fracture. IMPRESSION: 1. No acute intracranial abnormality. 2. Mild chronic small vessel ischemic disease. Electronically signed by: Dasie Hamburg MD 12/03/2023 05:03 PM EST RP Workstation: [id number]   CT Cervical Spine Wo Contrast Result Date: 12/03/2023 EXAM: CT CERVICAL SPINE WITHOUT CONTRAST 12/03/2023 04:44:21 PM TECHNIQUE: CT of the cervical spine was performed without the administration of intravenous contrast. Multiplanar reformatted images are provided for review. Automated exposure control, iterative reconstruction, and/or weight based adjustment of the mA/kV was utilized to reduce the radiation dose to as low as reasonably achievable. COMPARISON: Cervical spine CT and  MRI 04/04/2023. CLINICAL HISTORY: Neck trauma (Age >= 65y). Fall. FINDINGS: CERVICAL SPINE: BONES AND ALIGNMENT: Chronic reversal of the normal cervical lordosis and grade 1 anterolisthesis of C4 on C5 and C5 on C6. No acute fracture or suspicious lesion. Moderate median C1-C2 arthropathy. DEGENERATIVE CHANGES: Multilevel disc degeneration with disc space narrowing and degenerative endplate changes being most severe at C6-C7. Widespread advanced facet arthrosis. No evidence of high grade spinal canal stenosis. Severe right neural foraminal stenosis at C6-C7. SOFT TISSUES: No prevertebral soft tissue swelling. IMPRESSION: 1. No acute cervical spine fracture or traumatic malalignment. 2. Advanced facet arthrosis with degenerative anterolisthesis of C4 on C5 and C5 on C6. Electronically signed by: Dasie Hamburg MD 12/03/2023 04:58 PM EST RP Workstation: [id number]    {Document cardiac monitor, telemetry assessment procedure when appropriate:32947} Procedures   Medications Ordered in the ED - No data to display Patient had a 1.5 cm laceration to her right parietal area was closed with 3 staples.   {Click here for ABCD2, HEART and other calculators REFRESH Note before signing:1}                              Medical Decision Making Amount and/or Complexity of Data Reviewed Radiology: ordered.   Fall with contusion to right humerus and right hip along with head injury with laceration to right parietal area  {Document critical care time when appropriate  Document review of labs and clinical decision tools ie CHADS2VASC2, etc  Document your independent review of radiology images and any outside records  Document your discussion with family members, caretakers and with consultants  Document social determinants of health affecting pt's care  Document your decision making why or why not admission, treatments were needed:32947:::1}   Final diagnoses:  Fall, initial encounter  Injury of head, initial  encounter    ED Discharge Orders     None

## 2023-12-03 NOTE — Discharge Instructions (Signed)
 Clean laceration twice a day with soap and water  and have the staples removed in 1 week.  Take Tylenol  for pain

## 2023-12-05 ENCOUNTER — Other Ambulatory Visit: Payer: Self-pay | Admitting: Family Medicine

## 2023-12-08 ENCOUNTER — Other Ambulatory Visit: Payer: Self-pay | Admitting: Family Medicine

## 2023-12-08 DIAGNOSIS — R918 Other nonspecific abnormal finding of lung field: Secondary | ICD-10-CM

## 2023-12-08 DIAGNOSIS — J4489 Other specified chronic obstructive pulmonary disease: Secondary | ICD-10-CM

## 2023-12-10 ENCOUNTER — Ambulatory Visit

## 2023-12-10 VITALS — BP 116/76 | HR 63 | Resp 16

## 2023-12-10 DIAGNOSIS — Z4802 Encounter for removal of sutures: Secondary | ICD-10-CM

## 2023-12-10 DIAGNOSIS — S0101XD Laceration without foreign body of scalp, subsequent encounter: Secondary | ICD-10-CM | POA: Diagnosis not present

## 2023-12-10 NOTE — Progress Notes (Signed)
 Established Patient Office Visit  Subjective   Patient ID: [name], female    DOB: 01/20/1945  Age: 78 y.o. MRN: [id number]  Chief Complaint  Patient presents with   Suture / Staple Removal    Pt here to follow up from ED on 11/18 for fall with head lesion. Here to remove staples    HPI Discussed the use of AI scribe software for clinical note transcription with the patient, who gave verbal consent to proceed.  History of Present Illness    [name] is a 78 year old female who presents for staple removal after a fall resulting in a head laceration. She is accompanied by her granddaughter, who is also her aide.  Head laceration and staple removal - Sustained a head laceration after a fall at home, requiring staple placement. - Staples are present and removal is requested at this visit. - Granddaughter expressed concern about washing hair due to staple location and risk of bleeding; initially used dry shampoo, later transitioned to regular shampoo when deemed safe. - Hair is managed with mousse to keep it away from the healing area and eyes.  Fall and syncope - Experienced a fall while standing in her room, preceded by a sudden sensation of faintness. - Struck her head on a nightstand beside her hospital bed during the fall.  Anticoagulation and bleeding tendency - Significant bleeding from the head laceration due to use of blood thinners. - History of easy bruising and bleeding, exacerbated by anticoagulant therapy.  Visual impairment and functional status - Blind and relies on her granddaughter for assistance with daily activities and care.      ROS    Objective:     BP 116/76   Pulse 63   Resp 16   SpO2 94%  BP Readings from Last 3 Encounters:  12/10/23 116/76  12/03/23 (!) 149/77  10/14/23 (!) 141/70   Wt Readings from Last 3 Encounters:  12/03/23 185 lb 3 oz (84 kg)  10/14/23 185 lb 3 oz (84 kg)  09/24/23 187 lb (84.8 kg)     Physical  Exam Vitals and nursing note reviewed.  Constitutional:      Appearance: Normal appearance.  HENT:     Head: Normocephalic.  Eyes:     Extraocular Movements: Extraocular movements intact.     Pupils: Pupils are equal, round, and reactive to light.  Cardiovascular:     Rate and Rhythm: Normal rate and regular rhythm.  Pulmonary:     Effort: Pulmonary effort is normal.     Breath sounds: Normal breath sounds.  Musculoskeletal:     Cervical back: Normal range of motion and neck supple.  Neurological:     Mental Status: She is alert and oriented to person, place, and time.  Psychiatric:        Mood and Affect: Mood normal.        Thought Content: Thought content normal.      No results found for any visits on 12/10/23.    The 10-year ASCVD risk score (Arnett DK, et al., 2019) is: 47.6%    Assessment & Plan:   Problem List Items Addressed This Visit   None Visit Diagnoses       Laceration of scalp, subsequent encounter    -  Primary   History of fall possibly due to loss of balance.  No specific cause identified. Blood thinner use may increase bleeding risk. Three staples were placed in ER.  Encounter for staple removal       Three staples removed successfully.  No infection, scab present. - Use styling products cautiously, avoiding laceration site.      Return for as needed.    Leita Longs, FNP

## 2023-12-16 ENCOUNTER — Encounter: Payer: Self-pay | Admitting: Family Medicine

## 2023-12-16 ENCOUNTER — Ambulatory Visit: Admitting: Family Medicine

## 2023-12-16 VITALS — BP 128/70 | HR 63 | Resp 16

## 2023-12-16 DIAGNOSIS — Z0001 Encounter for general adult medical examination with abnormal findings: Secondary | ICD-10-CM

## 2023-12-16 DIAGNOSIS — E785 Hyperlipidemia, unspecified: Secondary | ICD-10-CM

## 2023-12-16 DIAGNOSIS — I10 Essential (primary) hypertension: Secondary | ICD-10-CM

## 2023-12-16 DIAGNOSIS — N1831 Chronic kidney disease, stage 3a: Secondary | ICD-10-CM

## 2023-12-16 DIAGNOSIS — Z23 Encounter for immunization: Secondary | ICD-10-CM

## 2023-12-16 NOTE — Progress Notes (Unsigned)
    [name]     MRN: [id number]      DOB: November 11, 1945  Chief Complaint  Patient presents with   Annual Exam    Cpe     HPI: Patient is in for annual physical exam. No other health concerns are expressed or addressed at the visit. Recent labs,  are reviewed. Immunization is reviewed , and  updated if needed.   PE: Pleasant  female, alert and oriented x 3, in no cardio-pulmonary distress. Afebrile. HEENT No facial trauma or asymetry. Sinuses non tender.  Extra occullar muscles intact.. External ears normal, . Neck: supple, no adenopathy,JVD or thyromegaly.No bruits.  Chest: Clear to ascultation bilaterally.No crackles or wheezes. Non tender to palpation  Breast: No asymetry,no masses or lumps. No tenderness. No nipple discharge or inversion. No axillary or supraclavicular adenopathy  Cardiovascular system; Heart sounds normal,  S1 and  S2 ,no S3.  No murmur, or thrill. Apical beat not displaced Peripheral pulses normal.  Abdomen: Soft, non tender, no organomegaly or masses. No bruits. Bowel sounds normal. No guarding, tenderness or rebound.   GU: External genitalia normal female genitalia , normal female distribution of hair. No lesions. Urethral meatus normal in size, no  Prolapse, no lesions visibly  Present. Bladder non tender. Vagina pink and moist , with no visible lesions , discharge present . Adequate pelvic support no  cystocele or rectocele noted Cervix pink and appears healthy, no lesions or ulcerations noted, no discharge noted from os Uterus normal size, no adnexal masses, no cervical motion or adnexal tenderness.   Musculoskeletal exam: Full ROM of spine, hips , shoulders and knees. No deformity ,swelling or crepitus noted. No muscle wasting or atrophy.   Neurologic: Cranial nerves 2 to 12 intact. Power, tone ,sensation and reflexes normal throughout. No disturbance in gait. No tremor.  Skin: Intact, no ulceration, erythema , scaling  or rash noted. Pigmentation normal throughout  Psych; Normal mood and affect. Judgement and concentration normal   Assessment & Plan:  No problem-specific Assessment & Plan notes found for this encounter.

## 2023-12-16 NOTE — Patient Instructions (Signed)
 F/U in 4.5 months  Flu vaccine today  Please get covid vaccine next week  Fasting lipid, cmp and EGFr this week   You are referred fro US  of your kidneys and to see kidney Specialist  You are referred fro twice weekly in home PT dueto recent fall  PLEASE do not try to walk or stand without someone assisting you as you are very high risk of falls

## 2023-12-19 ENCOUNTER — Ambulatory Visit: Payer: Self-pay | Admitting: Family Medicine

## 2023-12-19 LAB — CMP14+EGFR
ALT: 8 IU/L (ref 0–32)
AST: 12 IU/L (ref 0–40)
Albumin: 4.3 g/dL (ref 3.8–4.8)
Alkaline Phosphatase: 84 IU/L (ref 49–135)
BUN/Creatinine Ratio: 16 (ref 12–28)
BUN: 18 mg/dL (ref 8–27)
Bilirubin Total: 0.4 mg/dL (ref 0.0–1.2)
CO2: 26 mmol/L (ref 20–29)
Calcium: 9.7 mg/dL (ref 8.7–10.3)
Chloride: 102 mmol/L (ref 96–106)
Creatinine, Ser: 1.16 mg/dL — ABNORMAL HIGH (ref 0.57–1.00)
Globulin, Total: 2.6 g/dL (ref 1.5–4.5)
Glucose: 96 mg/dL (ref 70–99)
Potassium: 4.9 mmol/L (ref 3.5–5.2)
Sodium: 142 mmol/L (ref 134–144)
Total Protein: 6.9 g/dL (ref 6.0–8.5)
eGFR: 48 mL/min/1.73 — ABNORMAL LOW (ref >59)

## 2023-12-19 LAB — LIPID PANEL
Chol/HDL Ratio: 2.3 ratio (ref 0.0–4.4)
Cholesterol, Total: 163 mg/dL (ref 100–199)
HDL: 72 mg/dL (ref >39)
LDL Chol Calc (NIH): 69 mg/dL (ref 0–99)
Triglycerides: 129 mg/dL (ref 0–149)
VLDL Cholesterol Cal: 22 mg/dL (ref 5–40)

## 2023-12-23 ENCOUNTER — Encounter: Payer: Self-pay | Admitting: Family Medicine

## 2023-12-23 DIAGNOSIS — N1831 Chronic kidney disease, stage 3a: Secondary | ICD-10-CM | POA: Insufficient documentation

## 2023-12-23 DIAGNOSIS — Z23 Encounter for immunization: Secondary | ICD-10-CM | POA: Insufficient documentation

## 2023-12-23 NOTE — Assessment & Plan Note (Signed)
 Obtain renal US  and refer nephrology, avoid all nephrotoxic drugs

## 2023-12-23 NOTE — Assessment & Plan Note (Signed)
 Annual exam as documented. . Immunization and cancer screening needs are specifically addressed at this visit.

## 2023-12-23 NOTE — Assessment & Plan Note (Signed)
 After obtaining informed consent, the influenza vaccine is  administered , with no adverse effect noted at the time of administration.

## 2023-12-24 ENCOUNTER — Ambulatory Visit: Admitting: Orthopedic Surgery

## 2023-12-24 ENCOUNTER — Encounter: Payer: Self-pay | Admitting: Orthopedic Surgery

## 2023-12-24 ENCOUNTER — Telehealth: Payer: Self-pay

## 2023-12-24 DIAGNOSIS — G8929 Other chronic pain: Secondary | ICD-10-CM

## 2023-12-24 DIAGNOSIS — M1711 Unilateral primary osteoarthritis, right knee: Secondary | ICD-10-CM | POA: Diagnosis not present

## 2023-12-24 MED ORDER — TRAMADOL HCL 50 MG PO TABS: 50.0000 mg | ORAL_TABLET | Freq: Two times a day (BID) | ORAL | 0 refills | Status: DC

## 2023-12-24 NOTE — Progress Notes (Signed)
 Orthopaedic Clinic Return  Summary: ANISA LEANOS is a 78 y.o. female with the following: 1. Chronic pain of right knee  Assessment and Plan Assessment & Plan Right knee osteoarthritis: Mild Chronic osteoarthritis with recent exacerbation post-fall. Previous steroid injections effective. No swelling or significant range of motion limitations. Medial joint line tenderness noted. Negative Lachman test and stable varus/valgus stress. - Administered steroid injection to the right knee. - Prescribed tramadol  twice daily. - Continue Tylenol  up to three times daily. - Consider topical treatments and compression sleeve for the right knee.   Procedure note injection Right knee joint   Verbal consent was obtained to inject the right knee joint  Timeout was completed to confirm the site of injection.  The skin was prepped with alcohol and ethyl chloride was sprayed at the injection site.  A 21-gauge needle was used to inject 40 mg of Depo-Medrol  and 1% lidocaine  (4 cc) into the right knee using an anterolateral approach.  There were no complications. A sterile bandage was applied.    Follow-up: Return if symptoms worsen or fail to improve.   Subjective:  Chief Complaint  Patient presents with   Leg Pain    Different R leg concerns for yrs. Pt also had a fall 12/03/23 and soreness.     Discussed the use of AI scribe software for clinical note transcription with the patient, who gave verbal consent to proceed.  History of Present Illness BURNETTE VALENTI is a 78 year old female with osteoarthritis who presents for medication refill and right knee pain. She presents to see me today due to the retirement of Dr. Brenna, her previous physician.  She has osteoarthritis primarily in the right knee and has benefitted from prior steroid injections. A fall about three weeks ago worsened her right knee pain, which she describes as a twisted, sore feeling around the joint.  She currently takes  tramadol  twice daily, reduced from three times daily, uses Tylenol  three times a day, and applies topical agents like Icy Hot for additional relief.  She had imaging after the fall and still has general soreness.    Review of Systems: No fevers or chills No numbness or tingling No chest pain No shortness of breath No bowel or bladder dysfunction No GI distress No headaches   Objective: There were no vitals taken for this visit.    Physical Exam MUSCULOSKELETAL: No swelling in right knee. Good range of motion in right knee. Tenderness to palpation over medial joint line of right knee. Negative Lachman test in right knee. Able to maintain straight leg raise in right leg. No increased laxity with varus or valgus stress in right knee.  Seated in wheelchair.  Alert and oriented.  No acute distress.  IMAGING: I personally ordered and reviewed the following images:  No new imaging obtained today   Portions of this note were completed with dictation software and mistakes or typos may exist.   Oneil DELENA Horde, MD 12/24/2023 1:36 PM

## 2023-12-24 NOTE — Patient Instructions (Signed)

## 2023-12-24 NOTE — Telephone Encounter (Signed)
 [facility] left message stating that the instructions for this patient's Tramadol  isn't correct.  Please call them at [phone]

## 2023-12-25 ENCOUNTER — Ambulatory Visit (HOSPITAL_COMMUNITY): Admission: RE | Admit: 2023-12-25 | Discharge: 2023-12-25 | Attending: Family Medicine | Admitting: Family Medicine

## 2023-12-25 ENCOUNTER — Encounter: Admitting: Family Medicine

## 2023-12-25 DIAGNOSIS — N1831 Chronic kidney disease, stage 3a: Secondary | ICD-10-CM | POA: Insufficient documentation

## 2023-12-25 DIAGNOSIS — I10 Essential (primary) hypertension: Secondary | ICD-10-CM | POA: Insufficient documentation

## 2023-12-25 NOTE — Telephone Encounter (Signed)
 Called and correct instructions given.

## 2023-12-25 NOTE — Telephone Encounter (Signed)
 Pt instructions has: Take every 8 hours and every 12 hours. Please verify.

## 2023-12-31 ENCOUNTER — Other Ambulatory Visit: Payer: Self-pay | Admitting: Family Medicine

## 2023-12-31 DIAGNOSIS — J4489 Other specified chronic obstructive pulmonary disease: Secondary | ICD-10-CM

## 2023-12-31 DIAGNOSIS — R918 Other nonspecific abnormal finding of lung field: Secondary | ICD-10-CM

## 2024-01-05 ENCOUNTER — Other Ambulatory Visit: Payer: Self-pay | Admitting: Family Medicine

## 2024-01-05 ENCOUNTER — Other Ambulatory Visit: Payer: Self-pay | Admitting: Cardiology

## 2024-01-13 ENCOUNTER — Other Ambulatory Visit (HOSPITAL_COMMUNITY): Payer: Self-pay

## 2024-01-13 ENCOUNTER — Other Ambulatory Visit: Payer: Self-pay | Admitting: Family Medicine

## 2024-01-13 DIAGNOSIS — J4489 Other specified chronic obstructive pulmonary disease: Secondary | ICD-10-CM

## 2024-01-13 DIAGNOSIS — R918 Other nonspecific abnormal finding of lung field: Secondary | ICD-10-CM

## 2024-01-17 ENCOUNTER — Ambulatory Visit: Payer: Self-pay | Admitting: *Deleted

## 2024-01-17 NOTE — Telephone Encounter (Signed)
 FYI Only or Action Required?: FYI only for provider: UC advised.  Patient was last seen in primary care on 12/16/2023 by Antonetta Rollene BRAVO, MD.  Called Nurse Triage reporting No chief complaint on file..  Symptoms began several days ago.  Interventions attempted: Other: wearing oxygen  full time.  Symptoms are: gradually worsening.  Triage Disposition: See HCP Within 4 Hours (Or PCP Triage)  Patient/caregiver understands and will follow disposition?: Yes   Red Word that prompted transfer to Nurse Triage: cough w/ mucous x 1 week- worsening   ----------------------------------------------------------------------- From previous Reason for Contact - Scheduling: Patient/patient representative is calling to schedule an appointment. Refer to attachments for appointment information. Reason for Disposition  [1] MILD difficulty breathing (e.g., minimal/no SOB at rest, SOB with walking, pulse < 100) AND [2] still present when not coughing  Answer Assessment - Initial Assessment Questions 1. ONSET: When did the cough begin?      3-4 days ago 2. SEVERITY: How bad is the cough today?      Worse at night- patient is having to wear O2 full time 3. SPUTUM: Describe the color of your sputum (e.g., none, dry cough; clear, white, yellow, green)     Yellow/green 4. HEMOPTYSIS: Are you coughing up any blood? If Yes, ask: How much? (e.g., flecks, streaks, tablespoons, etc.)     no 5. DIFFICULTY BREATHING: Are you having difficulty breathing? If Yes, ask: How bad is it? (e.g., mild, moderate, severe)      Difficultly breathing- without O2- wearing full time for 2 days- 92% 6. FEVER: Do you have a fever? If Yes, ask: What is your temperature, how was it measured, and when did it start?     Unsure- takes tylenol  7. CARDIAC HISTORY: Do you have any history of heart disease? (e.g., heart attack, congestive heart failure)      HTN 8. LUNG HISTORY: Do you have any history of lung  disease?  (e.g., pulmonary embolus, asthma, emphysema)     Pulmonary nodules  10. OTHER SYMPTOMS: Do you have any other symptoms? (e.g., runny nose, wheezing, chest pain)       congestion  12. TRAVEL: Have you traveled out of the country in the last month? (e.g., travel history, exposures)       *No Answer*  Protocols used: Cough - Acute Productive-A-AH

## 2024-01-18 ENCOUNTER — Other Ambulatory Visit: Payer: Self-pay

## 2024-01-18 ENCOUNTER — Ambulatory Visit
Admission: RE | Admit: 2024-01-18 | Discharge: 2024-01-18 | Disposition: A | Discharge status: Home / Self Care | Source: Ambulatory Visit | Attending: Nurse Practitioner | Admitting: Nurse Practitioner

## 2024-01-18 ENCOUNTER — Ambulatory Visit (INDEPENDENT_AMBULATORY_CARE_PROVIDER_SITE_OTHER)

## 2024-01-18 VITALS — BP 111/57 | HR 69 | Temp 98.5°F | Resp 20

## 2024-01-18 DIAGNOSIS — R059 Cough, unspecified: Secondary | ICD-10-CM | POA: Diagnosis not present

## 2024-01-18 DIAGNOSIS — J441 Chronic obstructive pulmonary disease with (acute) exacerbation: Secondary | ICD-10-CM | POA: Diagnosis not present

## 2024-01-18 DIAGNOSIS — J069 Acute upper respiratory infection, unspecified: Secondary | ICD-10-CM | POA: Diagnosis not present

## 2024-01-18 LAB — POCT INFLUENZA A/B
Influenza A, POC: NEGATIVE
Influenza B, POC: NEGATIVE

## 2024-01-18 LAB — POC SOFIA SARS ANTIGEN FIA: SARS Coronavirus 2 Ag: NEGATIVE

## 2024-01-18 MED ORDER — METHYLPREDNISOLONE SODIUM SUCC 125 MG IJ SOLR
80.0000 mg | Freq: Once | INTRAMUSCULAR | Status: AC
  Administered 2024-01-18: 80 mg via INTRAMUSCULAR

## 2024-01-18 MED ORDER — AZELASTINE HCL 0.1 % NA SOLN: 2.0000 | Freq: Two times a day (BID) | NASAL | 0 refills | Status: AC

## 2024-01-18 MED ORDER — DOXYCYCLINE HYCLATE 100 MG PO TABS: 100.0000 mg | ORAL_TABLET | Freq: Two times a day (BID) | ORAL | 0 refills | Status: AC

## 2024-01-18 MED ORDER — BENZONATATE 100 MG PO CAPS: 100.0000 mg | ORAL_CAPSULE | Freq: Three times a day (TID) | ORAL | 0 refills | Status: AC | PRN

## 2024-01-18 MED ORDER — PREDNISONE 20 MG PO TABS: 40.0000 mg | ORAL_TABLET | Freq: Every day | ORAL | 0 refills | Status: AC

## 2024-01-18 NOTE — ED Triage Notes (Signed)
 Pt being seen in UC for productive cough x3-4 days. Pt family unsure of fever. Pt on 2L O2 [place] at baseline, able to be off for a few hours at a time, pt family member mentioned pt O2 has been dropping to 91% when taking off O2. Pt reports having generalized aches.

## 2024-01-18 NOTE — Discharge Instructions (Addendum)
 The COVID and flu test were negative. You were given an injection of Solu-Medrol  80 mg.  Start the prednisone  tomorrow. Take medication as prescribed.  Start the prednisone  tomorrow. Increase fluids and allow for plenty of rest. You may take over-the-counter Tylenol  as needed for pain, fever, or general discomfort. Recommend use of a humidifier in your bedroom at nighttime during sleep and sleeping elevated on pillows while symptoms persist. Continue to monitor symptoms for worsening.  If you develop worsening wheezing, shortness of breath, difficulty breathing, or chest pain, please go to the emergency department immediately for further evaluation. Recommend follow-up with your primary care physician within the next 7 to 10 days for reevaluation. Follow-up as needed.

## 2024-01-18 NOTE — ED Provider Notes (Signed)
 " [facility]    CSN: [id number] Arrival date & time: 01/18/24  1008      History   Chief Complaint Chief Complaint  Patient presents with   Cough    Cough congestion and diarrhea - Entered by patient    HPI [name] is a 79 y.o. female.   The history is provided by the patient and a relative.   Patient brought in by her family for complaints of cough, wheezing, and shortness of breath.  Family reports symptoms started over the past 3 to 4 days.  Family reports patient wears oxygen  at baseline.  States that she is able to keep her oxygen  off at times during the day, but states that her oxygen  levels have dropped since she developed the cough.  States that her sats dropped down to 91%.  Patient denies fever, chills, ear pain, ear drainage, chest pain, abdominal pain, nausea, vomiting, diarrhea, or rash.  Patient reports underlying history of COPD.  Patient is currently on 2 L via nasal cannula at this time, O2 sats are at 96%.  Past Medical History:  Diagnosis Date   Aortic atherosclerosis    Breast cancer (HCC) 2007   Stage I (T1b N0 M0), grade 1 well-differentiated carcinoma of the left breast status, post lumpectomy followed by radiation therapy. Her estrogen receptor receptors were 93%, progesterone receptors 67%. HER-2/neu was negative. No lymphovascular space invasion was seen. All margins were clear. Ki-67 marker was low at 1% with surgery on 11/15/2004. Treated then with post-lumpectomy radiation, finish   Breast cancer, left breast (HCC) 2007   COPD (chronic obstructive pulmonary disease) (HCC)    Coronary artery calcification seen on CAT scan    Diabetes mellitus without complication (HCC)    diet controlled   Diverticula of colon    Hx of rickettsial disease    Hyperlipidemia    Hypertension    Kidney stones    Nicotine  dependence    Osteoarthritis     Patient Active Problem List   Diagnosis Date Noted   Immunization due 12/23/2023   Stage 3a  chronic kidney disease (HCC) 12/23/2023   Gastroesophageal reflux disease 09/24/2023   Screening for lung cancer 09/15/2023   Vitamin D  deficiency 09/15/2023   Urinary incontinence 09/15/2023   Injury of left toe 04/26/2023   Leg swelling 04/23/2023   Chronic venous insufficiency of lower extremity 04/23/2023   Osteoarthrosis, ankle and foot 04/03/2023   Ankle edema, bilateral 02/19/2023   Decreased vision in both eyes 01/11/2023   Encounter for Medicare annual examination with abnormal findings 12/05/2022   HFrEF (heart failure with reduced ejection fraction) (HCC) 07/30/2022   Atrial fibrillation (HCC) 07/30/2022   Chronic respiratory failure with hypoxia (HCC) 07/25/2022   Abnormal CXR 05/31/2022   Tremor 05/31/2022   COPD with acute exacerbation (HCC) 04/07/2022   Acute HFrEF/systolic dysfunction CHF with EF 35 to 40% and regional wall motion normalities 6 04/07/2022   New onset a-fib (HCC) 04/06/2022   Long term (current) use of anticoagulants 01/15/2022   Long term (current) use of inhaled steroids 01/15/2022   Oxygen  dependent 01/15/2022   Encounter for annual general medical examination with abnormal findings in adult 12/03/2021   Back pain with left-sided radiculopathy 11/13/2021   Cough 11/13/2021   Head trauma 06/13/2021   Skin lesion of left arm 06/01/2021   Urinary frequency 05/18/2021   COVID-19 virus infection 09/30/2020   Thrombocytosis 08/25/2020   Former cigarette smoker 08/25/2020   Hx of  breast cancer 08/25/2020   Low vitamin B12 level 08/22/2020   History of adenomatous polyp of colon 05/23/2020   Aortic atherosclerosis 02/16/2020   COPD GOLD 3/ AB 11/19/2019   Prediabetes 06/23/2019   Muscle spasm 09/14/2018   Coronary artery calcification seen on CAT scan 11/09/2017   Colon adenomas 11/07/2017   Bloating symptom 11/07/2017   Allergic rhinitis 01/04/2015   Aortic ectasia 09/26/2014   Hydrosalpinx 09/28/2013   Osteopenia 07/15/2013   postmenopausal  ovarian cyst 04/06/2013   Pulmonary nodules 04/04/2012   Breast cancer (HCC) 03/11/2012   Ganglion cyst 05/08/2011   COPD (chronic obstructive pulmonary disease) with chronic bronchitis (HCC) 07/24/2009   Hyperlipidemia LDL goal <100 03/09/2009   CONSTIPATION, CHRONIC 03/09/2009   HTN (hypertension) 09/29/2008   GENERALIZED OSTEOARTHROSIS UNSPECIFIED SITE 09/29/2008    Past Surgical History:  Procedure Laterality Date   ABDOMINAL HYSTERECTOMY     BREAST SURGERY  2005 approx   left lumpectomy    CATARACT EXTRACTION W/PHACO Left 05/01/2019   Procedure: CATARACT EXTRACTION PHACO AND INTRAOCULAR LENS PLACEMENT LEFT EYE CDE=11.73;  Surgeon: Harrie Agent, MD;  Location: [facility];  Service: Ophthalmology;  Laterality: Left;  left   CATARACT EXTRACTION W/PHACO Right 05/15/2019   Procedure: CATARACT EXTRACTION PHACO AND INTRAOCULAR LENS PLACEMENT RIGHT EYE;  Surgeon: Harrie Agent, MD;  Location: [facility];  Service: Ophthalmology;  Laterality: Right;  CDE: 16.07   CHOLECYSTECTOMY N/A 05/26/2014   Procedure: LAPAROSCOPIC CHOLECYSTECTOMY;  Surgeon: Oneil Budge Md, MD;  Location: [facility];  Service: General;  Laterality: N/A;   COLECTOMY     2005, diverticulitis   COLONOSCOPY N/A 05/20/2017    Surgeon: Harvey Margo CROME, MD;  five 3-6 mm polyps (4 tubular adenomas, 1 hyperplastic polyp), tortuous left colon, internal hemorrhoids.  Recommended repeat colonoscopy in 3 years.   COLONOSCOPY WITH PROPOFOL  N/A 07/12/2020   Procedure: COLONOSCOPY WITH PROPOFOL ;  Surgeon: Cindie Carlin POUR, DO;  Location: [facility];  Service: Endoscopy;  Laterality: N/A;  12:30pm   DILATION AND CURETTAGE OF UTERUS     ESOPHAGOGASTRODUODENOSCOPY N/A 05/20/2017   Surgeon: Harvey Margo CROME, MD; normal esophagus and mild gastritis due to aspirin .  Biopsies negative for H. pylori.   left breast      cancer, in 2006   POLYPECTOMY  07/12/2020   Procedure: POLYPECTOMY;  Surgeon: Cindie Carlin POUR, DO;  Location: [facility];  Service:  Endoscopy;;   TUBAL LIGATION      OB History     Gravida  8   Para  5   Term  5   Preterm  0   AB  3   Living         SAB  3   IAB  0   Ectopic  0   Multiple      Live Births               Home Medications    Prior to Admission medications  Medication Sig Start Date End Date Taking? Authorizing Provider  azelastine  (ASTELIN ) 0.1 % nasal spray Place 2 sprays into both nostrils 2 (two) times daily. Use in each nostril as directed 01/18/24  Yes Leath-Warren, Etta PARAS, NP  benzonatate  (TESSALON ) 100 MG capsule Take 1 capsule (100 mg total) by mouth 3 (three) times daily as needed for cough. 01/18/24  Yes Leath-Warren, Etta PARAS, NP  doxycycline  (VIBRA -TABS) 100 MG tablet Take 1 tablet (100 mg total) by mouth 2 (two) times daily for 7 days. 01/18/24 01/25/24  Yes Leath-Warren, Etta PARAS, NP  predniSONE  (DELTASONE ) 20 MG tablet Take 2 tablets (40 mg total) by mouth daily with breakfast for 5 days. 01/18/24 01/23/24 Yes Leath-Warren, Etta PARAS, NP  acetaminophen  (TYLENOL ) 500 MG tablet Take 500 mg by mouth every 6 (six) hours as needed for moderate pain (pain score 4-6).    [provider]  albuterol  (VENTOLIN  HFA) 108 (90 Base) MCG/ACT inhaler INHALE (2) PUFFS EVERY FOURHOURS AS NEEDED ONLY IF YOU CANT CATCH YOUR BREATH Patient taking differently: Inhale 2 puffs into the lungs in the morning and at bedtime. 05/12/21   Tobie Suzzane POUR, MD  bisoprolol  (ZEBETA ) 5 MG tablet Take 1 tablet (5 mg total) by mouth daily. 06/06/23   Antonetta Rollene BRAVO, MD  Calcium  Carbonate-Vitamin D  (CALCIUM  600 + D PO) Take 1 tablet by mouth 2 (two) times daily.    [provider]  Cyanocobalamin  (VITAMIN B 12) 500 MCG TABS Take 500 mcg by mouth daily. Take one tablet by mouth once daily 08/18/20   Antonetta Rollene BRAVO, MD  ELIQUIS  5 MG TABS tablet TAKE 1 TABLET BY MOUTH TWICE A DAY 01/06/24   Antonetta Rollene BRAVO, MD  ENTRESTO  24-26 MG TAKE ONE TABLET BY MOUTH TWICE DAILY 01/06/24    Antonetta Rollene BRAVO, MD  FARXIGA  10 MG TABS tablet Take 1 tablet (10 mg total) by mouth daily. 01/06/24   Antonetta Rollene BRAVO, MD  fluticasone  (FLONASE ) 50 MCG/ACT nasal spray PLACE 2 SPRAYS IN EACH NOSTRIL DAILY. Patient taking differently: Place 2 sprays into both nostrils daily. Can use an extra time once at night for a runny nose. 09/27/23   Antonetta Rollene BRAVO, MD  furosemide  (LASIX ) 20 MG tablet Take 1 tablet by mouth daily. May take an additional tablet as needed for fluid/edema 10/31/23   Alvan Dorn FALCON, MD  hydrOXYzine  (ATARAX ) 25 MG tablet TAKE ONE TABLET BY MOUTH TWICE DAILY AS NEEDED FOR ITCHING 12/05/23   Antonetta Rollene BRAVO, MD  Misc. Devices MISC Please provide patient with mastectomy bra and prosthesis. Dx: History of left breast cancer 12/04/22   Antonetta Rollene BRAVO, MD  Naphazoline HCl (CLEAR EYES OP) Place 1 drop into both eyes daily as needed (irritation). Patient not taking: Reported on 01/18/2024    [provider]  pantoprazole  (PROTONIX ) 40 MG tablet Take 1 tablet (40 mg total) by mouth every morning. 06/19/23   Wert, Michael B, MD  polyethylene glycol powder (GLYCOLAX /MIRALAX ) powder MIX 1 CAPFUL IN 8 OUNCES OF JUICE OR WATER  AND DRINK ONCE DAILY. Patient taking differently: Take 17 g by mouth daily. 04/23/16   Antonetta Rollene BRAVO, MD  rosuvastatin  (CRESTOR ) 20 MG tablet Take 1 tablet (20 mg total) by mouth daily. 01/06/24   Alvan Dorn FALCON, MD  spironolactone  (ALDACTONE ) 25 MG tablet Take 1 tablet (25 mg total) by mouth daily. 10/10/23   Antonetta Rollene BRAVO, MD  SYMBICORT  160-4.5 MCG/ACT inhaler INHALE TWO PUFFS INTO THE LUNGS TWICE DAILY 01/13/24   Antonetta Rollene BRAVO, MD  traMADol  (ULTRAM ) 50 MG tablet Take 1 tablet (50 mg total) by mouth 2 (two) times daily. TAKE (1) TABLET BY MOUTH EVERY 8 HOURS AS NEEDED. Patient taking differently: Take 25 mg by mouth 2 (two) times daily. TAKE (1) TABLET BY MOUTH EVERY 8 HOURS AS NEEDED. 12/24/23   Onesimo Oneil LABOR, MD  UNABLE TO  FIND Med Name: Incontinence pads - 1 bag monthly 08/05/23   Antonetta Rollene BRAVO, MD  UNABLE TO FIND Med Name: Large Chux - [phone]  monthly 08/05/23   Antonetta Rollene BRAVO, MD  UNABLE TO FIND Med Name: Wipes - 2 packs monthly 08/05/23   Antonetta Rollene BRAVO, MD  UNABLE TO FIND Med Name: Large Gloves - 1 box monthly 08/05/23   Antonetta Rollene BRAVO, MD  UNABLE TO FIND Med Name: Wipes 08/06/23   Antonetta Rollene BRAVO, MD    Family History Family History  Problem Relation Age of Onset   Breast cancer Mother    COPD Father    Emphysema Father    Throat cancer Sister    Glaucoma Brother    Schizophrenia Brother    Heart attack Brother    Lung cancer Sister        former smoker   Hepatitis C Daughter    Seizures Daughter    SIDS Son    Colon cancer Neg Hx    Colon polyps Neg Hx     Social History Social History[1]   Allergies   Penicillins, Sulfonamide derivatives, Avapro  [irbesartan ], and Levaquin  [levofloxacin ]   Review of Systems Review of Systems Per HPI  Physical Exam Triage Vital Signs ED Triage Vitals [01/18/24 1017]  Encounter Vitals Group     BP      Girls Systolic BP Percentile      Girls Diastolic BP Percentile      Boys Systolic BP Percentile      Boys Diastolic BP Percentile      Pulse      Resp      Temp      Temp src      SpO2      Weight      Height      Head Circumference      Peak Flow      Pain Score 10     Pain Loc      Pain Education      Exclude from Growth Chart    No data found.  Updated Vital Signs BP (!) 111/57 (BP Location: Right Arm)   Pulse 69   Temp 98.5 F (36.9 C) (Oral)   Resp 20   SpO2 96%   Visual Acuity Right Eye Distance:   Left Eye Distance:   Bilateral Distance:    Right Eye Near:   Left Eye Near:    Bilateral Near:     Physical Exam Vitals and nursing note reviewed.  Constitutional:      General: She is not in acute distress.    Appearance: Normal appearance.  HENT:     Head: Normocephalic.     Right Ear: Tympanic  membrane, ear canal and external ear normal.     Nose: Congestion present.     Right Turbinates: Enlarged and swollen.     Left Turbinates: Enlarged and swollen.     Right Sinus: No maxillary sinus tenderness or frontal sinus tenderness.     Left Sinus: No maxillary sinus tenderness or frontal sinus tenderness.     Mouth/Throat:     Lips: Pink.     Pharynx: Uvula midline. Postnasal drip present. No pharyngeal swelling, oropharyngeal exudate, posterior oropharyngeal erythema or uvula swelling.  Eyes:     Extraocular Movements: Extraocular movements intact.     Pupils: Pupils are equal, round, and reactive to light.  Cardiovascular:     Rate and Rhythm: Normal rate and regular rhythm.     Pulses: Normal pulses.     Heart sounds: Normal heart sounds.  Pulmonary:     Effort: Pulmonary effort is normal.  Breath sounds: Examination of the right-lower field reveals rhonchi. Examination of the left-lower field reveals rhonchi. Rhonchi present.     Comments: Rhonchi noted in the posterior bilateral lung fields.  Rhonchi clears with cough. Abdominal:     General: Bowel sounds are normal.     Palpations: Abdomen is soft.  Musculoskeletal:     Cervical back: Normal range of motion.  Skin:    General: Skin is warm and dry.  Neurological:     General: No focal deficit present.     Mental Status: She is alert and oriented to person, place, and time.  Psychiatric:        Mood and Affect: Mood normal.        Behavior: Behavior normal.      UC Treatments / Results  Labs (all labs ordered are listed, but only abnormal results are displayed) Labs Reviewed  POC SOFIA SARS ANTIGEN FIA - Normal  POCT INFLUENZA A/B - Normal    EKG   Radiology DG Chest 2 View Result Date: 01/18/2024 EXAM: 2 VIEW(S) XRAY OF THE CHEST 01/18/2024 10:50:56 AM COMPARISON: None available. CLINICAL HISTORY: cough and SOB with increased oxygen  demand x 3-4 days FINDINGS: LUNGS AND PLEURA: Coarsened interstitial  markings. No pleural effusion. No pneumothorax. HEART AND MEDIASTINUM: No acute abnormality of the cardiac and mediastinal silhouettes. BONES AND SOFT TISSUES: Multilevel degenerative changes of thoracic spine. IMPRESSION: 1. No acute findings. Electronically signed by: Waddell Calk MD 01/18/2024 11:03 AM EST RP Workstation: [id number]    Procedures Procedures (including critical care time)  Medications Ordered in UC Medications - No data to display  Initial Impression / Assessment and Plan / UC Course  I have reviewed the triage vital signs and the nursing notes.  Pertinent labs & imaging results that were available during my care of the patient were reviewed by me and considered in my medical decision making (see chart for details).  COVID test and influenza test were negative.  Chest x-ray is negative for active cardiopulmonary disease.  On exam, patient has rhonchi noted throughout, rhonchi clears with cough.  Symptoms consistent with viral URI with COPD exacerbation.  Treat with doxycycline  100 mg twice daily for the next 7 days, prednisone  40 mg, fluticasone  50 mcg nasal spray for nasal congestion, and benzonatate  100 mg for the cough.  Supportive care recommendations were provided and discussed with the patient's family to include fluids, rest, over-the-counter Tylenol , continued oxygen , and use of a humidifier during sleep.  Patient's family was given strict ER follow-up precautions.  Recommend follow-up with the patient's PCP within the next 7 to 10 days for reevaluation.  Patient and family were in agreement with this plan of care and verbalized understanding.  All questions were answered.  Patient stable for discharge.  Final Clinical Impressions(s) / UC Diagnoses   Final diagnoses:  Cough, unspecified type  Viral URI with cough  COPD exacerbation (HCC)     Discharge Instructions      The COVID and flu test were negative. Chest x-ray results are pending.  You will be  contacted if the pending test results are abnormal.  You will also have access to the results via MyChart. Take medication as prescribed. Increase fluids and allow for plenty of rest. You may take over-the-counter Tylenol  as needed for pain, fever, or general discomfort. Recommend use of a humidifier in your bedroom at nighttime during sleep and sleeping elevated on pillows while symptoms persist. Continue to monitor symptoms for worsening.  If  you develop worsening wheezing, shortness of breath, difficulty breathing, or chest pain, please go to the emergency department immediately for further evaluation. Recommend follow-up with your primary care physician within the next 7 to 10 days for reevaluation. Follow-up as needed.     ED Prescriptions     Medication Sig Dispense Auth. Provider   doxycycline  (VIBRA -TABS) 100 MG tablet Take 1 tablet (100 mg total) by mouth 2 (two) times daily for 7 days. 14 tablet Leath-Warren, Etta PARAS, NP   predniSONE  (DELTASONE ) 20 MG tablet Take 2 tablets (40 mg total) by mouth daily with breakfast for 5 days. 10 tablet Leath-Warren, Etta PARAS, NP   benzonatate  (TESSALON ) 100 MG capsule Take 1 capsule (100 mg total) by mouth 3 (three) times daily as needed for cough. 30 capsule Leath-Warren, Etta PARAS, NP   azelastine  (ASTELIN ) 0.1 % nasal spray Place 2 sprays into both nostrils 2 (two) times daily. Use in each nostril as directed 30 mL Leath-Warren, Etta PARAS, NP      PDMP not reviewed this encounter.     [1]  Social History Tobacco Use   Smoking status: Former    Current packs/day: 0.00    Average packs/day: 0.5 packs/day for 50.0 years (25.0 ttl pk-yrs)    Types: Cigarettes    Start date: 04/08/1972    Quit date: 04/09/2022    Years since quitting: 1.7   Smokeless tobacco: Never   Tobacco comments:       Vaping Use   Vaping status: Never Used  Substance Use Topics   Alcohol use: No    Alcohol/week: 0.0 standard drinks of alcohol   Drug  use: No     Gilmer Etta PARAS, NP 01/18/24 1107  "

## 2024-02-02 ENCOUNTER — Other Ambulatory Visit: Payer: Self-pay | Admitting: Family Medicine

## 2024-02-02 ENCOUNTER — Other Ambulatory Visit: Payer: Self-pay | Admitting: Cardiology

## 2024-02-02 DIAGNOSIS — I5020 Unspecified systolic (congestive) heart failure: Secondary | ICD-10-CM

## 2024-02-11 ENCOUNTER — Other Ambulatory Visit: Payer: Self-pay | Admitting: Family Medicine

## 2024-02-11 ENCOUNTER — Other Ambulatory Visit: Payer: Self-pay | Admitting: Orthopedic Surgery

## 2024-02-11 DIAGNOSIS — R918 Other nonspecific abnormal finding of lung field: Secondary | ICD-10-CM

## 2024-02-11 DIAGNOSIS — J4489 Other specified chronic obstructive pulmonary disease: Secondary | ICD-10-CM

## 2024-02-19 ENCOUNTER — Other Ambulatory Visit (HOSPITAL_COMMUNITY): Payer: Self-pay | Admitting: Nephrology

## 2024-02-19 DIAGNOSIS — N1831 Chronic kidney disease, stage 3a: Secondary | ICD-10-CM

## 2024-02-26 ENCOUNTER — Ambulatory Visit (HOSPITAL_COMMUNITY)

## 2024-02-27 ENCOUNTER — Ambulatory Visit: Admitting: Physician Assistant

## 2024-03-02 ENCOUNTER — Ambulatory Visit: Payer: 59

## 2024-04-07 ENCOUNTER — Ambulatory Visit

## 2024-05-19 ENCOUNTER — Ambulatory Visit: Admitting: Family Medicine
